# Patient Record
Sex: Female | Born: 1965
Health system: Southern US, Community
[De-identification: ages and names within clinical notes are randomized; demographics above are authoritative.]

## PROBLEM LIST (undated history)

## (undated) DIAGNOSIS — C50919 Malignant neoplasm of unspecified site of unspecified female breast: Secondary | ICD-10-CM

## (undated) DIAGNOSIS — Z809 Family history of malignant neoplasm, unspecified: Secondary | ICD-10-CM

## (undated) DIAGNOSIS — E785 Hyperlipidemia, unspecified: Secondary | ICD-10-CM

## (undated) DIAGNOSIS — K5190 Ulcerative colitis, unspecified, without complications: Secondary | ICD-10-CM

## (undated) DIAGNOSIS — F32A Depression, unspecified: Secondary | ICD-10-CM

## (undated) DIAGNOSIS — R030 Elevated blood-pressure reading, without diagnosis of hypertension: Secondary | ICD-10-CM

## (undated) DIAGNOSIS — E119 Type 2 diabetes mellitus without complications: Secondary | ICD-10-CM

## (undated) DIAGNOSIS — E079 Disorder of thyroid, unspecified: Secondary | ICD-10-CM

## (undated) DIAGNOSIS — I10 Essential (primary) hypertension: Secondary | ICD-10-CM

## (undated) DIAGNOSIS — T7840XA Allergy, unspecified, initial encounter: Secondary | ICD-10-CM

## (undated) DIAGNOSIS — C801 Malignant (primary) neoplasm, unspecified: Secondary | ICD-10-CM

## (undated) DIAGNOSIS — I8390 Asymptomatic varicose veins of unspecified lower extremity: Secondary | ICD-10-CM

## (undated) DIAGNOSIS — R51 Headache: Secondary | ICD-10-CM

## (undated) DIAGNOSIS — M1990 Unspecified osteoarthritis, unspecified site: Secondary | ICD-10-CM

## (undated) DIAGNOSIS — J301 Allergic rhinitis due to pollen: Secondary | ICD-10-CM

## (undated) DIAGNOSIS — B019 Varicella without complication: Secondary | ICD-10-CM

## (undated) DIAGNOSIS — F329 Major depressive disorder, single episode, unspecified: Secondary | ICD-10-CM

## (undated) DIAGNOSIS — R112 Nausea with vomiting, unspecified: Secondary | ICD-10-CM

## (undated) DIAGNOSIS — J45909 Unspecified asthma, uncomplicated: Secondary | ICD-10-CM

## (undated) DIAGNOSIS — K635 Polyp of colon: Secondary | ICD-10-CM

## (undated) DIAGNOSIS — Z803 Family history of malignant neoplasm of breast: Secondary | ICD-10-CM

## (undated) DIAGNOSIS — E039 Hypothyroidism, unspecified: Secondary | ICD-10-CM

## (undated) DIAGNOSIS — F419 Anxiety disorder, unspecified: Secondary | ICD-10-CM

## (undated) DIAGNOSIS — Z9109 Other allergy status, other than to drugs and biological substances: Secondary | ICD-10-CM

## (undated) DIAGNOSIS — Z9889 Other specified postprocedural states: Secondary | ICD-10-CM

## (undated) DIAGNOSIS — G43909 Migraine, unspecified, not intractable, without status migrainosus: Secondary | ICD-10-CM

## (undated) DIAGNOSIS — K219 Gastro-esophageal reflux disease without esophagitis: Secondary | ICD-10-CM

## (undated) DIAGNOSIS — Z923 Personal history of irradiation: Secondary | ICD-10-CM

## (undated) HISTORY — DX: Disorder of thyroid, unspecified: E07.9

## (undated) HISTORY — DX: Unspecified asthma, uncomplicated: J45.909

## (undated) HISTORY — DX: Ulcerative colitis, unspecified, without complications: K51.90

## (undated) HISTORY — DX: Essential (primary) hypertension: I10

## (undated) HISTORY — DX: Major depressive disorder, single episode, unspecified: F32.9

## (undated) HISTORY — DX: Allergy, unspecified, initial encounter: T78.40XA

## (undated) HISTORY — PX: VEIN SURGERY: SHX48

## (undated) HISTORY — DX: Hyperlipidemia, unspecified: E78.5

## (undated) HISTORY — DX: Asymptomatic varicose veins of unspecified lower extremity: I83.90

## (undated) HISTORY — PX: HAND SURGERY: SHX662

## (undated) HISTORY — DX: Allergic rhinitis due to pollen: J30.1

## (undated) HISTORY — DX: Gastro-esophageal reflux disease without esophagitis: K21.9

## (undated) HISTORY — DX: Migraine, unspecified, not intractable, without status migrainosus: G43.909

## (undated) HISTORY — PX: OTHER SURGICAL HISTORY: SHX169

## (undated) HISTORY — DX: Family history of malignant neoplasm of breast: Z80.3

## (undated) HISTORY — DX: Depression, unspecified: F32.A

## (undated) HISTORY — PX: WISDOM TOOTH EXTRACTION: SHX21

## (undated) HISTORY — PX: CORONARY ARTERY BYPASS GRAFT: SHX141

## (undated) HISTORY — DX: Other allergy status, other than to drugs and biological substances: Z91.09

## (undated) HISTORY — DX: Family history of malignant neoplasm, unspecified: Z80.9

## (undated) HISTORY — DX: Headache: R51

## (undated) HISTORY — DX: Elevated blood-pressure reading, without diagnosis of hypertension: R03.0

## (undated) HISTORY — DX: Varicella without complication: B01.9

## (undated) HISTORY — PX: EXPLORATORY LAPAROTOMY: SUR591

## (undated) HISTORY — DX: Polyp of colon: K63.5

---
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
## 1997-08-04 ENCOUNTER — Other Ambulatory Visit: Admission: RE | Admit: 1997-08-04 | Discharge: 1997-08-04 | Payer: Self-pay | Admitting: Gynecology

## 1999-01-04 ENCOUNTER — Ambulatory Visit (HOSPITAL_COMMUNITY): Admission: RE | Admit: 1999-01-04 | Discharge: 1999-01-04 | Payer: Self-pay | Admitting: Internal Medicine

## 1999-09-22 ENCOUNTER — Other Ambulatory Visit: Admission: RE | Admit: 1999-09-22 | Discharge: 1999-09-22 | Payer: Self-pay | Admitting: Gynecology

## 2000-06-11 ENCOUNTER — Encounter: Payer: Self-pay | Admitting: Obstetrics and Gynecology

## 2000-06-11 ENCOUNTER — Ambulatory Visit (HOSPITAL_COMMUNITY): Admission: RE | Admit: 2000-06-11 | Discharge: 2000-06-11 | Payer: Self-pay | Admitting: Obstetrics and Gynecology

## 2000-06-13 ENCOUNTER — Encounter: Payer: Self-pay | Admitting: Obstetrics and Gynecology

## 2000-06-13 ENCOUNTER — Ambulatory Visit (HOSPITAL_COMMUNITY): Admission: RE | Admit: 2000-06-13 | Discharge: 2000-06-13 | Payer: Self-pay | Admitting: Obstetrics and Gynecology

## 2000-06-19 ENCOUNTER — Encounter: Admission: RE | Admit: 2000-06-19 | Discharge: 2000-09-17 | Payer: Self-pay | Admitting: Obstetrics and Gynecology

## 2000-07-04 ENCOUNTER — Inpatient Hospital Stay (HOSPITAL_COMMUNITY): Admission: AD | Admit: 2000-07-04 | Discharge: 2000-07-04 | Payer: Self-pay | Admitting: Obstetrics and Gynecology

## 2000-07-05 ENCOUNTER — Encounter: Payer: Self-pay | Admitting: Obstetrics and Gynecology

## 2000-07-05 ENCOUNTER — Ambulatory Visit (HOSPITAL_COMMUNITY): Admission: RE | Admit: 2000-07-05 | Discharge: 2000-07-05 | Payer: Self-pay | Admitting: Obstetrics and Gynecology

## 2000-07-12 ENCOUNTER — Inpatient Hospital Stay (HOSPITAL_COMMUNITY): Admission: AD | Admit: 2000-07-12 | Discharge: 2000-07-12 | Payer: Self-pay | Admitting: Obstetrics and Gynecology

## 2000-07-27 ENCOUNTER — Encounter: Payer: Self-pay | Admitting: Obstetrics and Gynecology

## 2000-07-27 ENCOUNTER — Ambulatory Visit (HOSPITAL_COMMUNITY): Admission: RE | Admit: 2000-07-27 | Discharge: 2000-07-27 | Payer: Self-pay | Admitting: Obstetrics and Gynecology

## 2000-08-09 ENCOUNTER — Inpatient Hospital Stay (HOSPITAL_COMMUNITY): Admission: AD | Admit: 2000-08-09 | Discharge: 2000-08-12 | Payer: Self-pay | Admitting: Obstetrics & Gynecology

## 2000-09-17 ENCOUNTER — Other Ambulatory Visit: Admission: RE | Admit: 2000-09-17 | Discharge: 2000-09-17 | Payer: Self-pay | Admitting: Obstetrics & Gynecology

## 2001-10-22 ENCOUNTER — Other Ambulatory Visit: Admission: RE | Admit: 2001-10-22 | Discharge: 2001-10-22 | Payer: Self-pay | Admitting: Gynecology

## 2002-10-28 ENCOUNTER — Other Ambulatory Visit: Admission: RE | Admit: 2002-10-28 | Discharge: 2002-10-28 | Payer: Self-pay | Admitting: Gynecology

## 2003-11-05 ENCOUNTER — Other Ambulatory Visit: Admission: RE | Admit: 2003-11-05 | Discharge: 2003-11-05 | Payer: Self-pay | Admitting: Gynecology

## 2003-11-24 ENCOUNTER — Encounter: Admission: RE | Admit: 2003-11-24 | Discharge: 2003-11-24 | Payer: Self-pay | Admitting: Family Medicine

## 2003-12-04 ENCOUNTER — Encounter: Admission: RE | Admit: 2003-12-04 | Discharge: 2003-12-04 | Payer: Self-pay | Admitting: Family Medicine

## 2004-02-11 ENCOUNTER — Ambulatory Visit: Payer: Self-pay | Admitting: Family Medicine

## 2004-02-18 ENCOUNTER — Ambulatory Visit: Payer: Self-pay | Admitting: Internal Medicine

## 2004-06-10 ENCOUNTER — Ambulatory Visit: Payer: Self-pay | Admitting: Internal Medicine

## 2004-06-17 ENCOUNTER — Ambulatory Visit: Payer: Self-pay | Admitting: Internal Medicine

## 2004-08-19 ENCOUNTER — Ambulatory Visit: Payer: Self-pay | Admitting: Internal Medicine

## 2004-08-25 ENCOUNTER — Ambulatory Visit: Payer: Self-pay | Admitting: Internal Medicine

## 2004-09-07 ENCOUNTER — Ambulatory Visit: Payer: Self-pay | Admitting: Pulmonary Disease

## 2004-09-09 ENCOUNTER — Ambulatory Visit: Payer: Self-pay | Admitting: Cardiology

## 2004-09-22 ENCOUNTER — Ambulatory Visit: Payer: Self-pay | Admitting: Internal Medicine

## 2004-09-29 ENCOUNTER — Ambulatory Visit: Payer: Self-pay | Admitting: Pulmonary Disease

## 2004-10-14 ENCOUNTER — Ambulatory Visit: Payer: Self-pay | Admitting: Pulmonary Disease

## 2004-10-17 ENCOUNTER — Ambulatory Visit: Payer: Self-pay | Admitting: Pulmonary Disease

## 2004-11-10 ENCOUNTER — Ambulatory Visit: Payer: Self-pay | Admitting: Internal Medicine

## 2004-11-25 ENCOUNTER — Ambulatory Visit: Payer: Self-pay | Admitting: Pulmonary Disease

## 2004-12-05 ENCOUNTER — Other Ambulatory Visit: Admission: RE | Admit: 2004-12-05 | Discharge: 2004-12-05 | Payer: Self-pay | Admitting: Gynecology

## 2004-12-23 ENCOUNTER — Ambulatory Visit: Payer: Self-pay | Admitting: Pulmonary Disease

## 2005-01-06 ENCOUNTER — Ambulatory Visit: Payer: Self-pay | Admitting: Pulmonary Disease

## 2005-01-11 ENCOUNTER — Ambulatory Visit: Payer: Self-pay | Admitting: Internal Medicine

## 2005-01-20 ENCOUNTER — Ambulatory Visit: Payer: Self-pay | Admitting: Pulmonary Disease

## 2005-03-27 ENCOUNTER — Ambulatory Visit: Payer: Self-pay | Admitting: Pulmonary Disease

## 2005-03-30 ENCOUNTER — Ambulatory Visit: Payer: Self-pay | Admitting: Internal Medicine

## 2005-04-11 ENCOUNTER — Ambulatory Visit: Payer: Self-pay | Admitting: Internal Medicine

## 2005-04-13 ENCOUNTER — Ambulatory Visit: Payer: Self-pay | Admitting: Pulmonary Disease

## 2005-04-28 ENCOUNTER — Ambulatory Visit: Payer: Self-pay | Admitting: Pulmonary Disease

## 2005-05-08 ENCOUNTER — Ambulatory Visit: Payer: Self-pay | Admitting: Internal Medicine

## 2005-07-11 ENCOUNTER — Ambulatory Visit: Payer: Self-pay | Admitting: Internal Medicine

## 2005-08-16 ENCOUNTER — Ambulatory Visit: Payer: Self-pay | Admitting: Internal Medicine

## 2005-08-17 ENCOUNTER — Ambulatory Visit: Payer: Self-pay | Admitting: Internal Medicine

## 2005-08-18 ENCOUNTER — Ambulatory Visit: Payer: Self-pay | Admitting: Internal Medicine

## 2005-08-22 ENCOUNTER — Ambulatory Visit: Payer: Self-pay | Admitting: Internal Medicine

## 2005-10-27 ENCOUNTER — Ambulatory Visit: Payer: Self-pay | Admitting: Internal Medicine

## 2005-11-27 ENCOUNTER — Ambulatory Visit: Payer: Self-pay | Admitting: Internal Medicine

## 2006-11-29 DIAGNOSIS — J45909 Unspecified asthma, uncomplicated: Secondary | ICD-10-CM | POA: Insufficient documentation

## 2006-11-29 DIAGNOSIS — E039 Hypothyroidism, unspecified: Secondary | ICD-10-CM | POA: Insufficient documentation

## 2006-11-29 DIAGNOSIS — K589 Irritable bowel syndrome without diarrhea: Secondary | ICD-10-CM

## 2006-11-29 HISTORY — DX: Irritable bowel syndrome, unspecified: K58.9

## 2006-12-06 DIAGNOSIS — E785 Hyperlipidemia, unspecified: Secondary | ICD-10-CM | POA: Insufficient documentation

## 2006-12-06 DIAGNOSIS — R519 Headache, unspecified: Secondary | ICD-10-CM | POA: Insufficient documentation

## 2006-12-06 DIAGNOSIS — Z8541 Personal history of malignant neoplasm of cervix uteri: Secondary | ICD-10-CM

## 2006-12-06 DIAGNOSIS — R51 Headache: Secondary | ICD-10-CM

## 2006-12-06 HISTORY — DX: Headache: R51

## 2006-12-06 HISTORY — DX: Personal history of malignant neoplasm of cervix uteri: Z85.41

## 2008-04-17 HISTORY — PX: ENDOMETRIAL ABLATION: SHX621

## 2009-04-17 HISTORY — PX: ABLATION: SHX5711

## 2009-11-29 ENCOUNTER — Ambulatory Visit: Payer: Self-pay | Admitting: Internal Medicine

## 2010-02-02 ENCOUNTER — Ambulatory Visit: Payer: Self-pay | Admitting: Obstetrics and Gynecology

## 2010-03-22 ENCOUNTER — Ambulatory Visit: Payer: Self-pay | Admitting: Allergy

## 2010-04-17 HISTORY — PX: FOOT SURGERY: SHX648

## 2010-08-29 ENCOUNTER — Ambulatory Visit: Payer: Self-pay | Admitting: Obstetrics and Gynecology

## 2011-08-30 ENCOUNTER — Ambulatory Visit: Payer: Self-pay | Admitting: Obstetrics and Gynecology

## 2011-12-06 ENCOUNTER — Ambulatory Visit: Payer: Self-pay | Admitting: Internal Medicine

## 2011-12-06 LAB — CREATININE, SERUM
Creatinine: 0.8 mg/dL (ref 0.60–1.30)
EGFR (Non-African Amer.): >60

## 2012-09-30 ENCOUNTER — Ambulatory Visit: Payer: Self-pay | Admitting: Gastroenterology

## 2012-10-01 LAB — PATHOLOGY REPORT

## 2012-10-29 ENCOUNTER — Ambulatory Visit: Payer: Self-pay | Admitting: Obstetrics and Gynecology

## 2012-12-16 LAB — HM COLONOSCOPY

## 2013-02-10 ENCOUNTER — Ambulatory Visit: Payer: Self-pay | Admitting: Physician Assistant

## 2013-02-10 LAB — RAPID STREP-A WITH REFLX: Micro Text Report: NEGATIVE

## 2013-02-13 LAB — BETA STREP CULTURE(ARMC)

## 2013-04-14 ENCOUNTER — Encounter: Payer: Self-pay | Admitting: Physician Assistant

## 2013-04-14 ENCOUNTER — Ambulatory Visit (INDEPENDENT_AMBULATORY_CARE_PROVIDER_SITE_OTHER): Payer: 59 | Admitting: Physician Assistant

## 2013-04-14 VITALS — BP 120/84 | HR 91 | Temp 98.5°F | Resp 16 | Ht 63.0 in | Wt 156.5 lb

## 2013-04-14 DIAGNOSIS — F329 Major depressive disorder, single episode, unspecified: Secondary | ICD-10-CM

## 2013-04-14 DIAGNOSIS — K5190 Ulcerative colitis, unspecified, without complications: Secondary | ICD-10-CM

## 2013-04-14 DIAGNOSIS — E785 Hyperlipidemia, unspecified: Secondary | ICD-10-CM

## 2013-04-14 DIAGNOSIS — J45909 Unspecified asthma, uncomplicated: Secondary | ICD-10-CM

## 2013-04-14 DIAGNOSIS — E039 Hypothyroidism, unspecified: Secondary | ICD-10-CM

## 2013-04-14 DIAGNOSIS — Z0000 Encounter for general adult medical examination without abnormal findings: Secondary | ICD-10-CM

## 2013-04-14 DIAGNOSIS — F341 Dysthymic disorder: Secondary | ICD-10-CM

## 2013-04-14 DIAGNOSIS — Z23 Encounter for immunization: Secondary | ICD-10-CM

## 2013-04-14 DIAGNOSIS — F419 Anxiety disorder, unspecified: Secondary | ICD-10-CM

## 2013-04-14 DIAGNOSIS — F32A Depression, unspecified: Secondary | ICD-10-CM

## 2013-04-14 DIAGNOSIS — I10 Essential (primary) hypertension: Secondary | ICD-10-CM

## 2013-04-14 DIAGNOSIS — B002 Herpesviral gingivostomatitis and pharyngotonsillitis: Secondary | ICD-10-CM

## 2013-04-14 LAB — BASIC METABOLIC PANEL WITH GFR
CO2: 26 mEq/L (ref 19–32)
Chloride: 103 mEq/L (ref 96–112)
Creat: 0.62 mg/dL (ref 0.50–1.10)
GFR, Est Non African American: >89 mL/min
Glucose, Bld: 93 mg/dL (ref 70–99)
Sodium: 139 mEq/L (ref 135–145)

## 2013-04-14 LAB — HEMOGLOBIN A1C: Mean Plasma Glucose: 131 mg/dL — ABNORMAL HIGH (ref <117)

## 2013-04-14 LAB — HEPATIC FUNCTION PANEL
ALT: 20 U/L (ref 0–35)
Albumin: 4.1 g/dL (ref 3.5–5.2)
Total Bilirubin: 0.3 mg/dL (ref 0.3–1.2)

## 2013-04-14 LAB — TSH: TSH: 1.392 u[IU]/mL (ref 0.350–4.500)

## 2013-04-14 LAB — LIPID PANEL
Cholesterol: 209 mg/dL — ABNORMAL HIGH (ref 0–200)
HDL: 51 mg/dL (ref >39)
LDL Cholesterol: 116 mg/dL — ABNORMAL HIGH (ref 0–99)
Total CHOL/HDL Ratio: 4.1 Ratio
Triglycerides: 210 mg/dL — ABNORMAL HIGH (ref <150)

## 2013-04-14 LAB — T4, FREE: Free T4: 1.63 ng/dL (ref 0.80–1.80)

## 2013-04-14 NOTE — Patient Instructions (Addendum)
Please obtain labs. I will call you with your results.  You will be contacted by OB/GYN and Gastroenterology.  It was a pleasure participating in your care today.

## 2013-04-14 NOTE — Progress Notes (Signed)
Pre visit review using our clinic review tool, if applicable. No additional management support is needed unless otherwise documented below in the visit note/SLS  

## 2013-04-14 NOTE — Progress Notes (Signed)
Patient ID: Erin Fields, female   DOB: 1965-10-12, 47 y.o.   MRN: [id number]  Patient presents to clinic today to establish care.  Acute Concerns: Patient fasting for labs.    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 Patient requests prescription for Zovirax to have at home, to begin taking at first sign of outbreak. Denies history of genital herpes.  (2) Ulcerative Colitis -- diagnosed several years prior. Patient needs referral to GI I, as she does not have followup since moving to [place]. Patient currently on mesalamine. Has prescription for Phenergan. Denies flareup.  (3) Hypertension -- patient currently on Benicar. Denies headache, vision changes, chest pain, lightheadedness, dizziness, shortness of breath or palpitation. Has had a sleep study in the past, which was negative for obstructive sleep apnea.  (4) Hypothyroidism -- currently on levothyroxine 100 mcg daily. Is due for repeat thyroid function testing.  (5) Allergic rhinitis -- seasonal. Has prescription for Nasacort. Take Singulair occasionally.  (6) Asthma -- mild to moderate persistent. Currently on Symbicort and Xopenex.  (7) Anxiety and Depression -- symptoms well controlled with venlafaxine. Occasional Ambien for sleep. Denies suicidal thought or ideation. Denies panic attack.  Health Maintenance: Dental -- UTD Vision -- UTD Immunizations -- UTD Colonoscopy -- 2014; benign polyp. Mammogram -- 2014 WNL PAP -- 2014 no abnormal findings; patient does have history of cervical cancer. Bone Density -- 2007; no evidence of osteopenia.  Past Medical History  Diagnosis Date  . Asthma   . Depression   . Frequent headaches   . GERD (gastroesophageal reflux disease)   . Hay fever   . Environmental allergies   . Elevated blood pressure   . Hyperlipidemia   . Migraines   . Colon polyps   . Thyroid disease   . Chicken pox     Past Surgical History  Procedure Laterality  Date  . Foot surgery  2012  . Ablation  2011  . Tonsillectomy  1980  . Wisdom tooth extraction    . Vein surgery      Removal Right Leg    No current outpatient prescriptions on file prior to visit.   No current facility-administered medications on file prior to visit.    Allergies  Allergen Reactions  . Ace Inhibitors Other (See Comments)    Angina  . Cefaclor   . Lisinopril Cough  . Losartan Potassium   . Sulfonamide Derivatives     Family History  Problem Relation Age of Onset  . Arthritis Mother 75    Deceased  . Breast cancer Mother   . Lung cancer Mother   . Hyperlipidemia Mother   . Heart disease Mother   . Hypertension Mother   . Diabetes Mother   . Crohn's disease Mother   . Diabetes Maternal Grandfather   . Heart disease Maternal Grandfather   . Heart attack Maternal Grandfather   . Colitis Sister     History   Social History  . Marital Status: Married    Spouse Name: N/A    Number of Children: N/A  . Years of Education: N/A   Occupational History  . Not on file.   Social History Main Topics  . Smoking status: Former Research scientist (life sciences)  . Smokeless tobacco: Never Used     Comment: Quit >20 yrs ago  . Alcohol Use: 1.8 oz/week    3 Glasses of wine per week  . Drug Use: No  . Sexual Activity: Not  on file   Other Topics Concern  . Not on file   Social History Narrative  . No narrative on file   Review of Systems  Constitutional: Negative for fever and weight loss.  HENT: Negative for ear discharge, ear pain, hearing loss and tinnitus.   Eyes: Negative for blurred vision, double vision, photophobia and pain.  Respiratory: Positive for shortness of breath and wheezing. Negative for cough.   Cardiovascular: Negative for chest pain and palpitations.  Gastrointestinal: Positive for heartburn. Negative for nausea, vomiting, abdominal pain, diarrhea, constipation, blood in stool and melena.  Genitourinary: Negative for dysuria, urgency, frequency,  hematuria and flank pain.  Neurological: Positive for headaches. Negative for dizziness, seizures and loss of consciousness.  Endo/Heme/Allergies: Positive for environmental allergies.  Psychiatric/Behavioral: Positive for depression. Negative for suicidal ideas, hallucinations and substance abuse. The patient is nervous/anxious. The patient does not have insomnia.    BP 120/84  Pulse 91  Temp(Src) 98.5 F (36.9 C) (Oral)  Resp 16  Ht 5' 3"  (1.6 m)  Wt 156 lb 8 oz (70.988 kg)  BMI 27.73 kg/m2  SpO2 97%  Physical Exam  Vitals reviewed. Constitutional: She is oriented to person, place, and time and well-developed, well-nourished, and in no distress.  HENT:  Head: Normocephalic and atraumatic.  Right Ear: External ear normal.  Left Ear: External ear normal.  Nose: Nose normal.  Mouth/Throat: Oropharynx is clear and moist. No oropharyngeal exudate.  Tympanic membranes within normal limits bilaterally.  Eyes: Conjunctivae are normal. Pupils are equal, round, and reactive to light.  Neck: Neck supple.  Cardiovascular: Normal rate, regular rhythm, normal heart sounds and intact distal pulses.   Pulmonary/Chest: Effort normal and breath sounds normal. No respiratory distress. She has no wheezes. She has no rales. She exhibits no tenderness.  Abdominal: Soft. Bowel sounds are normal. She exhibits no distension and no mass. There is no tenderness. There is no rebound and no guarding.  Lymphadenopathy:    She has no cervical adenopathy.  Neurological: She is alert and oriented to person, place, and time. No cranial nerve deficit.  Skin: Skin is warm and dry. No rash noted.  Psychiatric: Affect normal.   Assessment/Plan: No problem-specific assessment & plan notes found for this encounter.

## 2013-04-15 ENCOUNTER — Telehealth: Payer: Self-pay | Admitting: Physician Assistant

## 2013-04-15 LAB — CBC WITH DIFFERENTIAL/PLATELET
Basophils Absolute: 0.1 10*3/uL (ref 0.0–0.1)
Eosinophils Relative: 1 % (ref 0–5)
Hemoglobin: 14.5 g/dL (ref 12.0–15.0)
Lymphocytes Relative: 38 % (ref 12–46)
Lymphs Abs: 6.8 10*3/uL — ABNORMAL HIGH (ref 0.7–4.0)
MCH: 29.6 pg (ref 26.0–34.0)
MCV: 88.2 fL (ref 78.0–100.0)
Monocytes Relative: 8 % (ref 3–12)
Neutro Abs: 9.2 10*3/uL — ABNORMAL HIGH (ref 1.7–7.7)
Neutrophils Relative %: 53 % (ref 43–77)
Platelets: 515 10*3/uL — ABNORMAL HIGH (ref 150–400)
RBC: 4.9 MIL/uL (ref 3.87–5.11)
WBC: 17.6 10*3/uL — ABNORMAL HIGH (ref 4.0–10.5)

## 2013-04-15 LAB — VITAMIN D 25 HYDROXY (VIT D DEFICIENCY, FRACTURES): Vit D, 25-Hydroxy: 28 ng/mL — ABNORMAL LOW (ref 30–89)

## 2013-04-15 LAB — URINALYSIS, ROUTINE W REFLEX MICROSCOPIC
Glucose, UA: NEGATIVE mg/dL
Leukocytes, UA: NEGATIVE
Nitrite: NEGATIVE
Protein, ur: NEGATIVE mg/dL
Specific Gravity, Urine: 1.014 (ref 1.005–1.030)
Urobilinogen, UA: 0.2 mg/dL (ref 0.0–1.0)
pH: 5.5 (ref 5.0–8.0)

## 2013-04-15 NOTE — Telephone Encounter (Signed)
Notified pt and she voices understanding. Denies any current symptoms and scheduled appt for Friday at 7:15am to follow up on elevated WBC as she states she starts a new job on Monday.

## 2013-04-15 NOTE — Telephone Encounter (Signed)
Called patient to discuss lab results.  No answer.  LMOM for patient to return call.  If patient calls back while I am out of office:    Her labs revealed she has very slightly decreased Vitamin D levels.  I do not feel it is worrisome.  She may take a supplement if she likes, but making sure she gets 15 minutes of sunlight each day will probably suffice.  We will recheck this in 6 months.  Of note, her WBC count was significantly elevated.  She did not express any symptoms of abdominal pain, fever, etc at yesterday's visit.  If she is asymptomatic, I would like to see her in 1 week to recheck her blood count.  I have already placed a referral to GI for her Ulcerative Colitis.  She should be contacted by a GI office.  If she is asymptomatic, I would like for her to return to clinic sooner for re-evaluation.  Also, please ask patient if she has a history of elevated WBC due to her Ulcerative Colitis.  We need to get her records from her previous PCP.

## 2013-04-15 NOTE — Telephone Encounter (Signed)
Pt left message returning our call. Attempted to reach pt and left message for her to return my call.

## 2013-04-18 ENCOUNTER — Encounter: Payer: Self-pay | Admitting: Physician Assistant

## 2013-04-18 ENCOUNTER — Ambulatory Visit (INDEPENDENT_AMBULATORY_CARE_PROVIDER_SITE_OTHER): Payer: 59 | Admitting: Physician Assistant

## 2013-04-18 ENCOUNTER — Ambulatory Visit (HOSPITAL_BASED_OUTPATIENT_CLINIC_OR_DEPARTMENT_OTHER)
Admission: RE | Admit: 2013-04-18 | Discharge: 2013-04-18 | Disposition: A | Discharge status: Home / Self Care | Payer: 59 | Source: Ambulatory Visit | Attending: Physician Assistant | Admitting: Physician Assistant

## 2013-04-18 VITALS — BP 118/82 | HR 79 | Temp 97.6°F | Resp 16 | Ht 63.0 in | Wt 154.8 lb

## 2013-04-18 DIAGNOSIS — R05 Cough: Secondary | ICD-10-CM | POA: Insufficient documentation

## 2013-04-18 DIAGNOSIS — D473 Essential (hemorrhagic) thrombocythemia: Secondary | ICD-10-CM

## 2013-04-18 DIAGNOSIS — D72829 Elevated white blood cell count, unspecified: Secondary | ICD-10-CM | POA: Insufficient documentation

## 2013-04-18 DIAGNOSIS — D75839 Thrombocytosis, unspecified: Secondary | ICD-10-CM

## 2013-04-18 DIAGNOSIS — K5190 Ulcerative colitis, unspecified, without complications: Secondary | ICD-10-CM

## 2013-04-18 DIAGNOSIS — J329 Chronic sinusitis, unspecified: Secondary | ICD-10-CM | POA: Insufficient documentation

## 2013-04-18 DIAGNOSIS — R059 Cough, unspecified: Secondary | ICD-10-CM | POA: Insufficient documentation

## 2013-04-18 LAB — CBC WITH DIFFERENTIAL/PLATELET
BASOS ABS: 0 10*3/uL (ref 0.0–0.1)
Basophils Relative: 0 % (ref 0–1)
Eosinophils Absolute: 0.1 10*3/uL (ref 0.0–0.7)
Eosinophils Relative: 1 % (ref 0–5)
HCT: 42.7 % (ref 36.0–46.0)
Hemoglobin: 14.7 g/dL (ref 12.0–15.0)
LYMPHS PCT: 29 % (ref 12–46)
Lymphs Abs: 3.9 10*3/uL (ref 0.7–4.0)
MCH: 30.6 pg (ref 26.0–34.0)
MCHC: 34.4 g/dL (ref 30.0–36.0)
MCV: 89 fL (ref 78.0–100.0)
Monocytes Absolute: 0.9 10*3/uL (ref 0.1–1.0)
Monocytes Relative: 7 % (ref 3–12)
NEUTROS ABS: 8.4 10*3/uL — AB (ref 1.7–7.7)
NEUTROS PCT: 63 % (ref 43–77)
PLATELETS: 370 10*3/uL (ref 150–400)
RBC: 4.8 MIL/uL (ref 3.87–5.11)
RDW: 13.1 % (ref 11.5–15.5)
WBC: 13.4 10*3/uL — AB (ref 4.0–10.5)

## 2013-04-18 LAB — FERRITIN: FERRITIN: 64 ng/mL (ref 10–291)

## 2013-04-18 NOTE — Patient Instructions (Signed)
Please obtain labs.  I will call you with your results.  We will treat you accordingly. You will be contacted by GI office, hopefully before the end of the day.

## 2013-04-18 NOTE — Progress Notes (Signed)
Pre visit review using our clinic review tool, if applicable. No additional management support is needed unless otherwise documented below in the visit note/SLS  

## 2013-04-20 DIAGNOSIS — K5190 Ulcerative colitis, unspecified, without complications: Secondary | ICD-10-CM | POA: Insufficient documentation

## 2013-04-20 DIAGNOSIS — Z0000 Encounter for general adult medical examination without abnormal findings: Secondary | ICD-10-CM | POA: Insufficient documentation

## 2013-04-20 DIAGNOSIS — D72829 Elevated white blood cell count, unspecified: Secondary | ICD-10-CM | POA: Insufficient documentation

## 2013-04-20 DIAGNOSIS — B002 Herpesviral gingivostomatitis and pharyngotonsillitis: Secondary | ICD-10-CM | POA: Insufficient documentation

## 2013-04-20 HISTORY — DX: Herpesviral gingivostomatitis and pharyngotonsillitis: B00.2

## 2013-04-20 NOTE — Assessment & Plan Note (Signed)
With reactive thrombocytosis. Patient asymptomatic. Physical exam within normal limits. Will repeat labs. Will obtain chest x-ray. Patient already has referral in place to gastroenterology for her ulcerative colitis.  Workup unremarkable and leukocytosis persistent, will need further workup and referral to hematology. Hopefully this is just a lab abnormality or reaction to a viral illness.

## 2013-04-20 NOTE — Progress Notes (Signed)
Patient ID: Erin Fields, female   DOB: 01/29/1966, 48 y.o.   MRN: [id number]  Patient for followup with results. Patient was seen last week to establish care. Fasting labs were obtained, revealing significantly elevated white blood cell count with left shift and thrombocytosis.  Peripheral blood smear was obtained indicating a reactive calls a thrombocytosis. Patient does have a history of ulcerative colitis. Patient denies fever, chills, malaise, fatigue, night sweats or weight loss. Denies respiratory symptoms.  Denies abdominal pain, tenesmus, hematochezia or melena. Denies nausea or vomiting. Denies urinary symptoms. Urinalysis obtained last week was unremarkable. Kidney and liver function were within normal limits.   Past Medical History  Diagnosis Date  . Asthma   . Depression   . Frequent headaches   . GERD (gastroesophageal reflux disease)   . Hay fever   . Environmental allergies   . Elevated blood pressure   . Hyperlipidemia   . Migraines   . Colon polyps   . Thyroid disease   . Chicken pox     Current Outpatient Prescriptions on File Prior to Visit  Medication Sig Dispense Refill  . acyclovir (ZOVIRAX) 400 MG tablet Take 400 mg by mouth as needed.      . budesonide-formoterol (SYMBICORT) 160-4.5 MCG/ACT inhaler Inhale 2 puffs into the lungs 2 (two) times daily.      Marland Kitchen levalbuterol (XOPENEX HFA) 45 MCG/ACT inhaler Inhale 2 puffs into the lungs every 4 (four) hours as needed for wheezing (Q4-6 Hours PRN).      Marland Kitchen levothyroxine (SYNTHROID, LEVOTHROID) 100 MCG tablet Take 100 mcg by mouth daily before breakfast.      . mesalamine (LIALDA) 1.2 G EC tablet Take 2.4 g by mouth daily.      . montelukast (SINGULAIR) 10 MG tablet Take 10 mg by mouth every morning.      . olmesartan (BENICAR) 40 MG tablet Take 40 mg by mouth at bedtime.      . Omega-3 Fatty Acids (FISH OIL) 1200 MG CAPS Take 2 capsules by mouth at bedtime.      . promethazine (PHENERGAN) 25 MG tablet Take 25 mg by mouth  every 6 (six) hours as needed for nausea or vomiting.      . triamcinolone (NASACORT ALLERGY 24HR) 55 MCG/ACT AERO nasal inhaler Place 1 spray into the nose daily.      Marland Kitchen venlafaxine XR (EFFEXOR-XR) 75 MG 24 hr capsule Take 75 mg by mouth daily with breakfast.      . zolpidem (AMBIEN) 5 MG tablet Take 5 mg by mouth at bedtime as needed for sleep.       No current facility-administered medications on file prior to visit.    Allergies  Allergen Reactions  . Ace Inhibitors Other (See Comments)    Angina  . Cefaclor   . Lisinopril Cough  . Losartan Potassium   . Sulfonamide Derivatives     Family History  Problem Relation Age of Onset  . Arthritis Mother 57    Deceased  . Breast cancer Mother   . Lung cancer Mother   . Hyperlipidemia Mother   . Heart disease Mother   . Hypertension Mother   . Diabetes Mother   . Crohn's disease Mother   . Diabetes Maternal Grandfather   . Heart disease Maternal Grandfather   . Heart attack Maternal Grandfather   . Colitis Sister     History   Social History  . Marital Status: Married    Spouse Name: N/A  Number of Children: N/A  . Years of Education: N/A   Social History Main Topics  . Smoking status: Former Research scientist (life sciences)  . Smokeless tobacco: Never Used     Comment: Quit >20 yrs ago  . Alcohol Use: 1.8 oz/week    3 Glasses of wine per week  . Drug Use: No  . Sexual Activity: None   Other Topics Concern  . None   Social History Narrative  . None   Review of Systems - See HPI.  All other ROS are negative.  Filed Vitals:   04/18/13 0719  BP: 118/82  Pulse: 79  Temp: 97.6 F (36.4 C)  Resp: 16   Physical Exam  Vitals reviewed. Constitutional: She is oriented to person, place, and time and well-developed, well-nourished, and in no distress.  HENT:  Head: Normocephalic and atraumatic.  Right Ear: External ear normal.  Left Ear: External ear normal.  Nose: Nose normal.  Mouth/Throat: Oropharynx is clear and moist. No  oropharyngeal exudate.  Tympanic membranes within normal limits bilaterally. No tenderness to percussion of sinuses noted on examination.  Eyes: Conjunctivae and EOM are normal. Pupils are equal, round, and reactive to light.  Neck: Neck supple. No thyromegaly present.  Cardiovascular: Normal rate, regular rhythm, normal heart sounds and intact distal pulses.   Pulmonary/Chest: Effort normal and breath sounds normal. No respiratory distress. She has no wheezes. She has no rales. She exhibits no tenderness.  Abdominal: Soft. Bowel sounds are normal. She exhibits no distension and no mass. There is no tenderness. There is no rebound and no guarding.  Lymphadenopathy:    She has no cervical adenopathy.  Neurological: She is alert and oriented to person, place, and time.  Skin: Skin is warm and dry. No rash noted.  Psychiatric: Affect normal.    Recent Results (from the past 2160 hour(s))  CBC WITH DIFFERENTIAL     Status: Abnormal   Collection Time    04/14/13  8:29 AM      Result Value Range   WBC 17.6 (*) 4.0 - 10.5 K/uL   RBC 4.90  3.87 - 5.11 MIL/uL   Hemoglobin 14.5  12.0 - 15.0 g/dL   HCT 43.2  36.0 - 46.0 %   MCV 88.2  78.0 - 100.0 fL   MCH 29.6  26.0 - 34.0 pg   MCHC 33.6  30.0 - 36.0 g/dL   RDW 13.2  11.5 - 15.5 %   Platelets 515 (*) 150 - 400 K/uL   Comment: Platelet clumps noted on smear-count appears increased.   Neutrophils Relative % 53  43 - 77 %   Neutro Abs 9.2 (*) 1.7 - 7.7 K/uL   Lymphocytes Relative 38  12 - 46 %   Lymphs Abs 6.8 (*) 0.7 - 4.0 K/uL   Monocytes Relative 8  3 - 12 %   Monocytes Absolute 1.5 (*) 0.1 - 1.0 K/uL   Eosinophils Relative 1  0 - 5 %   Eosinophils Absolute 0.1  0.0 - 0.7 K/uL   Basophils Relative 0  0 - 1 %   Basophils Absolute 0.1  0.0 - 0.1 K/uL   Smear Review       Comment: Atypical lymphs.     Pending path review  BASIC METABOLIC PANEL WITH GFR     Status: None   Collection Time    04/14/13  8:29 AM      Result Value Range    Sodium 139  135 - 145 mEq/L  Potassium 4.8  3.5 - 5.3 mEq/L   Chloride 103  96 - 112 mEq/L   CO2 26  19 - 32 mEq/L   Glucose, Bld 93  70 - 99 mg/dL   BUN 14  6 - 23 mg/dL   Creat 0.62  0.50 - 1.10 mg/dL   Calcium 9.5  8.4 - 10.5 mg/dL   GFR, Est African American >89     GFR, Est Non African American >89     Comment:       The estimated GFR is a calculation valid for adults (>=81 years old)     that uses the CKD-EPI algorithm to adjust for age and sex. It is       not to be used for children, pregnant women, hospitalized patients,        patients on dialysis, or with rapidly changing kidney function.     According to the NKDEP, eGFR >89 is normal, 60-89 shows mild     impairment, 30-59 shows moderate impairment, 15-29 shows severe     impairment and <15 is ESRD.        TSH     Status: None   Collection Time    04/14/13  8:29 AM      Result Value Range   TSH 1.392  0.350 - 4.500 uIU/mL  T4, FREE     Status: None   Collection Time    04/14/13  8:29 AM      Result Value Range   Free T4 1.63  0.80 - 1.80 ng/dL  VITAMIN D 25 HYDROXY     Status: Abnormal   Collection Time    04/14/13  8:29 AM      Result Value Range   Vit D, 25-Hydroxy 28 (*) 30 - 89 ng/mL   Comment: This assay accurately quantifies Vitamin D, which is the sum of the     25-Hydroxy forms of Vitamin D2 and D3.  Studies have shown that the     optimum concentration of 25-Hydroxy Vitamin D is 30 ng/mL or higher.      Concentrations of Vitamin D between 20 and 29 ng/mL are considered to     be insufficient and concentrations less than 20 ng/mL are considered     to be deficient for Vitamin D.  URINALYSIS, ROUTINE W REFLEX MICROSCOPIC     Status: None   Collection Time    04/14/13  8:29 AM      Result Value Range   Color, Urine YELLOW  YELLOW   APPearance CLEAR  CLEAR   Specific Gravity, Urine 1.014  1.005 - 1.030   pH 5.5  5.0 - 8.0   Glucose, UA NEG  NEG mg/dL   Bilirubin Urine NEG  NEG   Ketones, ur NEG  NEG  mg/dL   Hgb urine dipstick NEG  NEG   Protein, ur NEG  NEG mg/dL   Urobilinogen, UA 0.2  0.0 - 1.0 mg/dL   Nitrite NEG  NEG   Leukocytes, UA NEG  NEG  HEPATIC FUNCTION PANEL     Status: Abnormal   Collection Time    04/14/13  8:29 AM      Result Value Range   Total Bilirubin 0.3  0.3 - 1.2 mg/dL   Bilirubin, Direct 0.1  0.0 - 0.3 mg/dL   Indirect Bilirubin 0.2  0.0 - 0.9 mg/dL   Alkaline Phosphatase 55  39 - 117 U/L   AST 16  0 - 37 U/L  ALT 20  0 - 35 U/L   Total Protein 5.9 (*) 6.0 - 8.3 g/dL   Albumin 4.1  3.5 - 5.2 g/dL  LIPID PANEL     Status: Abnormal   Collection Time    04/14/13  8:29 AM      Result Value Range   Cholesterol 209 (*) 0 - 200 mg/dL   Comment: ATP III Classification:           < 200        mg/dL        Desirable          200 - 239     mg/dL        Borderline High          >= 240        mg/dL        High         Triglycerides 210 (*) <150 mg/dL   HDL 51  >39 mg/dL   Total CHOL/HDL Ratio 4.1     VLDL 42 (*) 0 - 40 mg/dL   LDL Cholesterol 116 (*) 0 - 99 mg/dL   Comment:       Total Cholesterol/HDL Ratio:CHD Risk                            Coronary Heart Disease Risk Table                                            Men       Women              1/2 Average Risk              3.4        3.3                  Average Risk              5.0        4.4               2X Average Risk              9.6        7.1               3X Average Risk             23.4       11.0     Use the calculated Patient Ratio above and the CHD Risk table      to determine the patient's CHD Risk.     ATP III Classification (LDL):           < 100        mg/dL         Optimal          100 - 129     mg/dL         Near or Above Optimal          130 - 159     mg/dL         Borderline High          160 - 189     mg/dL         High           >  190        mg/dL         Very High        HEMOGLOBIN A1C     Status: Abnormal   Collection Time    04/14/13  8:29 AM      Result Value Range    Hemoglobin A1C 6.2 (*) <5.7 %   Comment:                                                                            According to the ADA Clinical Practice Recommendations for 2011, when     HbA1c is used as a screening test:             >=6.5%   Diagnostic of Diabetes Mellitus                (if abnormal result is confirmed)           5.7-6.4%   Increased risk of developing Diabetes Mellitus           References:Diagnosis and Classification of Diabetes Mellitus,Diabetes     VVZS,8270,78(MLJQG 1):S62-S69 and Standards of Medical Care in             Diabetes - 2011,Diabetes Care,2011,34 (Suppl 1):S11-S61.         Mean Plasma Glucose 131 (*) <117 mg/dL  PATHOLOGIST SMEAR REVIEW     Status: None   Collection Time    04/14/13  8:29 AM      Result Value Range   Path Review       Comment:   Absolute Lymphocytosis and neutrophilia with mild left shift in     maturation. Favor reactive process for both. Recommend clinical     correlation.    The overall findings in this smear are consistent with     a reactive thrombocytosis.  Clinical correlation is recommended.     Reviewed by Francis Gaines Mammarappallil MD (Electronic Signature on File)     04/15/13  CBC WITH DIFFERENTIAL     Status: Abnormal   Collection Time    04/18/13  8:16 AM      Result Value Range   WBC 13.4 (*) 4.0 - 10.5 K/uL   RBC 4.80  3.87 - 5.11 MIL/uL   Hemoglobin 14.7  12.0 - 15.0 g/dL   HCT 42.7  36.0 - 46.0 %   MCV 89.0  78.0 - 100.0 fL   MCH 30.6  26.0 - 34.0 pg   MCHC 34.4  30.0 - 36.0 g/dL   RDW 13.1  11.5 - 15.5 %   Platelets 370  150 - 400 K/uL   Neutrophils Relative % 63  43 - 77 %   Neutro Abs 8.4 (*) 1.7 - 7.7 K/uL   Lymphocytes Relative 29  12 - 46 %   Lymphs Abs 3.9  0.7 - 4.0 K/uL   Monocytes Relative 7  3 - 12 %   Monocytes Absolute 0.9  0.1 - 1.0 K/uL   Eosinophils Relative 1  0 - 5 %   Eosinophils Absolute 0.1  0.0 - 0.7 K/uL   Basophils Relative 0  0 - 1 %   Basophils Absolute 0.0  0.0 -  0.1 K/uL    Smear Review Criteria for review not met    FERRITIN     Status: None   Collection Time    04/18/13  8:16 AM      Result Value Range   Ferritin 64  10 - 291 ng/mL    Assessment/Plan: No problem-specific assessment & plan notes found for this encounter.

## 2013-04-20 NOTE — Assessment & Plan Note (Signed)
Continue mesalamine. Referral to GI placed. Patient request Dr. Earlean Shawl.

## 2013-04-20 NOTE — Assessment & Plan Note (Signed)
Will obtain thyroid function tests. Continue levothyroxine as prescribed. Will alter dose of needed.

## 2013-04-20 NOTE — Assessment & Plan Note (Signed)
Will obtain fasting lipid profile.

## 2013-04-20 NOTE — Assessment & Plan Note (Signed)
Zovirax for outbreak.

## 2013-04-20 NOTE — Assessment & Plan Note (Signed)
History reviewed and updated. Health maintenance up-to-date. Will obtain fasting labs. Will obtain records from previous PCP.

## 2013-04-20 NOTE — Assessment & Plan Note (Signed)
Continue current regimen

## 2013-04-22 ENCOUNTER — Telehealth: Payer: Self-pay | Admitting: *Deleted

## 2013-04-22 DIAGNOSIS — D72829 Elevated white blood cell count, unspecified: Secondary | ICD-10-CM

## 2013-04-22 NOTE — Telephone Encounter (Signed)
Notified pt. Lab order entered.

## 2013-04-22 NOTE — Telephone Encounter (Signed)
Message copied by Ronny Flurry on Tue Apr 22, 2013  5:28 PM ------      Message from: Raiford Noble      Created: Sat Apr 19, 2013 10:22 AM       Please inform patient that her WBC count is still elevated but is trending down (was 17.6 and now 13.4 in less than 1 week).  Platelets are back to normal.  Iron level is within normal limits.  As long as she remains asymptomatic, we will have her return to lab in 2-3 weeks for a CBC with diff to ensure resolution of reactive leukocytosis. ------

## 2013-05-13 LAB — CBC WITH DIFFERENTIAL/PLATELET
BASOS PCT: 1 % (ref 0–1)
Basophils Absolute: 0 10*3/uL (ref 0.0–0.1)
EOS ABS: 0.1 10*3/uL (ref 0.0–0.7)
EOS PCT: 1 % (ref 0–5)
HCT: 38.4 % (ref 36.0–46.0)
Hemoglobin: 13 g/dL (ref 12.0–15.0)
LYMPHS PCT: 44 % (ref 12–46)
Lymphs Abs: 3.2 10*3/uL (ref 0.7–4.0)
MCH: 30.4 pg (ref 26.0–34.0)
MCHC: 33.9 g/dL (ref 30.0–36.0)
MCV: 89.7 fL (ref 78.0–100.0)
MONOS PCT: 8 % (ref 3–12)
Monocytes Absolute: 0.6 10*3/uL (ref 0.1–1.0)
NEUTROS ABS: 3.3 10*3/uL (ref 1.7–7.7)
Neutrophils Relative %: 46 % (ref 43–77)
PLATELETS: 443 10*3/uL — AB (ref 150–400)
RBC: 4.28 MIL/uL (ref 3.87–5.11)
RDW: 13.4 % (ref 11.5–15.5)
WBC: 7.3 10*3/uL (ref 4.0–10.5)

## 2013-06-05 ENCOUNTER — Encounter: Payer: 59 | Admitting: Medical

## 2013-06-20 ENCOUNTER — Telehealth: Payer: Self-pay | Admitting: *Deleted

## 2013-06-20 DIAGNOSIS — B002 Herpesviral gingivostomatitis and pharyngotonsillitis: Secondary | ICD-10-CM

## 2013-06-20 MED ORDER — ACYCLOVIR 400 MG PO TABS: 400.0000 mg | ORAL_TABLET | Freq: Three times a day (TID) | ORAL | Status: DC | PRN

## 2013-06-20 NOTE — Telephone Encounter (Signed)
Ok to refill zovirax. Patient has history of [place] outbreak. Keeps an Rx at home to start at onset of outbreak.  If she has persistent symptoms or new symptoms she needs to schedule an office visit.

## 2013-06-20 NOTE — Telephone Encounter (Signed)
Pt left message requesting refill of acyclovir 460m three times a day for 5 days as needed to [facility] [address]  Please advise.

## 2013-06-20 NOTE — Telephone Encounter (Signed)
Rx sent. Notified pt and she voices understanding.

## 2013-06-27 ENCOUNTER — Telehealth: Payer: Self-pay | Admitting: Physician Assistant

## 2013-06-27 DIAGNOSIS — F419 Anxiety disorder, unspecified: Secondary | ICD-10-CM

## 2013-06-27 DIAGNOSIS — F329 Major depressive disorder, single episode, unspecified: Secondary | ICD-10-CM

## 2013-06-27 DIAGNOSIS — F32A Depression, unspecified: Secondary | ICD-10-CM

## 2013-06-27 MED ORDER — ZOLPIDEM TARTRATE 5 MG PO TABS: 5.0000 mg | ORAL_TABLET | Freq: Every evening | ORAL | Status: DC | PRN

## 2013-06-27 NOTE — Telephone Encounter (Signed)
Please Advise

## 2013-06-27 NOTE — Telephone Encounter (Signed)
ambien 5 mg

## 2013-06-27 NOTE — Telephone Encounter (Signed)
Ok to refill. 30 tablets no additional refill.

## 2013-06-27 NOTE — Telephone Encounter (Signed)
Rx request phoned to pharmacy/SLS

## 2013-07-02 ENCOUNTER — Telehealth: Payer: Self-pay | Admitting: Physician Assistant

## 2013-07-02 NOTE — Telephone Encounter (Signed)
Needs ambien rx called in to [facility] in [place]

## 2013-07-02 NOTE — Telephone Encounter (Signed)
Medication Detail      Disp Refills Start End     zolpidem (AMBIEN) 5 MG tablet 30 tablet 0 06/27/2013     Sig - Route: Take 1 tablet (5 mg total) by mouth at bedtime as needed for sleep. - Oral    Class: Phone In    Associated Diagnoses    Anxiety and depression - Primary    Pharmacy    [id number] - [place], [place] - [address]    LMOM to inform pt Rx phoned to pharmacy on 03.13.15/SLS

## 2013-08-12 ENCOUNTER — Other Ambulatory Visit: Payer: Self-pay | Admitting: Physician Assistant

## 2013-08-12 MED ORDER — ZOLPIDEM TARTRATE 5 MG PO TABS: ORAL_TABLET | ORAL | Status: DC

## 2013-08-12 NOTE — Telephone Encounter (Signed)
Rx called to pharmacy voicemail.  Please call pt to arrange follow up as she will need to be seen before further refills are given.

## 2013-08-12 NOTE — Telephone Encounter (Signed)
Pt last seen in January and advised f/u in 3 months. Pt has no future appts on file.  Please advise.  Medication name:  Name from pharmacy:  zolpidem (AMBIEN) 5 MG tablet  ZOLPIDEM 5MG TABLETS Sig: TAKE 1 TABLET BY MOUTH EVERY DAY AT BEDTIME AS NEEDED Dispense: 30 tablet Refills: 0 Start: 08/12/2013 Class: Normal Requested on: 08/12/2013 Originally ordered on: 04/14/2013 Last refill: 06/27/2013

## 2013-08-12 NOTE — Telephone Encounter (Signed)
Ok to refill quantity 30 with 0 refills.  No more refills until patient is seen for follow-up.

## 2013-08-12 NOTE — Addendum Note (Signed)
Addended by: Kelle Darting A on: 08/12/2013 04:46 PM   Modules accepted: Orders

## 2013-08-14 NOTE — Telephone Encounter (Signed)
Informed patient of medication refill and she scheduled appointment for 08/20/13

## 2013-08-14 NOTE — Telephone Encounter (Signed)
Left message for patient to return my call.

## 2013-08-20 ENCOUNTER — Encounter: Payer: Self-pay | Admitting: Physician Assistant

## 2013-08-20 ENCOUNTER — Telehealth: Payer: Self-pay | Admitting: Physician Assistant

## 2013-08-20 ENCOUNTER — Ambulatory Visit (INDEPENDENT_AMBULATORY_CARE_PROVIDER_SITE_OTHER): Payer: 59 | Admitting: Physician Assistant

## 2013-08-20 VITALS — BP 114/76 | HR 76 | Temp 98.4°F | Resp 16 | Ht 63.0 in | Wt 161.2 lb

## 2013-08-20 DIAGNOSIS — F329 Major depressive disorder, single episode, unspecified: Secondary | ICD-10-CM

## 2013-08-20 DIAGNOSIS — B002 Herpesviral gingivostomatitis and pharyngotonsillitis: Secondary | ICD-10-CM

## 2013-08-20 DIAGNOSIS — G4700 Insomnia, unspecified: Secondary | ICD-10-CM

## 2013-08-20 DIAGNOSIS — F32A Depression, unspecified: Secondary | ICD-10-CM

## 2013-08-20 DIAGNOSIS — R252 Cramp and spasm: Secondary | ICD-10-CM

## 2013-08-20 DIAGNOSIS — I10 Essential (primary) hypertension: Secondary | ICD-10-CM

## 2013-08-20 DIAGNOSIS — F419 Anxiety disorder, unspecified: Secondary | ICD-10-CM

## 2013-08-20 DIAGNOSIS — K5190 Ulcerative colitis, unspecified, without complications: Secondary | ICD-10-CM

## 2013-08-20 DIAGNOSIS — F341 Dysthymic disorder: Secondary | ICD-10-CM

## 2013-08-20 DIAGNOSIS — E039 Hypothyroidism, unspecified: Secondary | ICD-10-CM

## 2013-08-20 HISTORY — DX: Insomnia, unspecified: G47.00

## 2013-08-20 MED ORDER — ACYCLOVIR 400 MG PO TABS: 400.0000 mg | ORAL_TABLET | Freq: Three times a day (TID) | ORAL | Status: DC | PRN

## 2013-08-20 MED ORDER — OLMESARTAN MEDOXOMIL 40 MG PO TABS: 40.0000 mg | ORAL_TABLET | Freq: Every day | ORAL | Status: DC

## 2013-08-20 MED ORDER — VENLAFAXINE HCL ER 75 MG PO CP24: 75.0000 mg | ORAL_CAPSULE | Freq: Every day | ORAL | Status: DC

## 2013-08-20 MED ORDER — ZOLPIDEM TARTRATE 5 MG PO TABS: ORAL_TABLET | ORAL | Status: DC

## 2013-08-20 MED ORDER — LEVOTHYROXINE SODIUM 100 MCG PO TABS: 100.0000 ug | ORAL_TABLET | Freq: Every day | ORAL | Status: DC

## 2013-08-20 MED ORDER — PROMETHAZINE HCL 25 MG PO TABS: 25.0000 mg | ORAL_TABLET | Freq: Four times a day (QID) | ORAL | Status: DC | PRN

## 2013-08-20 NOTE — Assessment & Plan Note (Signed)
Ambien refilled.  Pharmacy can fill when due on 09/11/13

## 2013-08-20 NOTE — Patient Instructions (Signed)
Please return to the lab at your earliest convenience so we can check your potassium and magnesium levels.  Continue medications as directed.  We will follow-up in 3 months.  At that time we will recheck some labs including your thyroid function. Return sooner if you need anything.

## 2013-08-20 NOTE — Telephone Encounter (Signed)
Relevant patient education assigned to patient using Emmi. ° °

## 2013-08-20 NOTE — Assessment & Plan Note (Signed)
Asymptomatic.  BP normotensive.  Patient with mild muscle cramps.  Patient is on ARB.  Will check BMP and magnesium level.  Continue Benicar for now.

## 2013-08-20 NOTE — Assessment & Plan Note (Signed)
Medications refilled.  Follow-up in 3 months.  Will repeat TSH at that visit.

## 2013-08-20 NOTE — Progress Notes (Signed)
Patient presents to clinic today for medications refills.  Patient has been doing well on current medication regimen.  Denies chest pain, palpitations, lightheadedness or dizziness.  Endorses BP well controlled with Benicar.  BP normotensive in clinic today.  Patient continue levothyroxine daily. Has been stable on current dose.  Due for repeat TSH level in 3 months.    Past Medical History  Diagnosis Date  . Asthma   . Depression   . Frequent headaches   . GERD (gastroesophageal reflux disease)   . Hay fever   . Environmental allergies   . Elevated blood pressure   . Hyperlipidemia   . Migraines   . Colon polyps   . Thyroid disease   . Chicken pox     Current Outpatient Prescriptions on File Prior to Visit  Medication Sig Dispense Refill  . budesonide-formoterol (SYMBICORT) 160-4.5 MCG/ACT inhaler Inhale 2 puffs into the lungs 2 (two) times daily.      Marland Kitchen levalbuterol (XOPENEX HFA) 45 MCG/ACT inhaler Inhale 2 puffs into the lungs every 4 (four) hours as needed for wheezing (Q4-6 Hours PRN).      Marland Kitchen mesalamine (LIALDA) 1.2 G EC tablet Take 2.4 g by mouth daily.      . montelukast (SINGULAIR) 10 MG tablet Take 10 mg by mouth every morning.      . Omega-3 Fatty Acids (FISH OIL) 1200 MG CAPS Take 2 capsules by mouth at bedtime.      . triamcinolone (NASACORT ALLERGY 24HR) 55 MCG/ACT AERO nasal inhaler Place 1 spray into the nose daily.       No current facility-administered medications on file prior to visit.    Allergies  Allergen Reactions  . Ace Inhibitors Other (See Comments)    Angina  . Cefaclor   . Lisinopril Cough  . Losartan Potassium   . Sulfonamide Derivatives     Family History  Problem Relation Age of Onset  . Arthritis Mother 19    Deceased  . Breast cancer Mother   . Lung cancer Mother   . Hyperlipidemia Mother   . Heart disease Mother   . Hypertension Mother   . Diabetes Mother   . Crohn's disease Mother   . Diabetes Maternal Grandfather   . Heart  disease Maternal Grandfather   . Heart attack Maternal Grandfather   . Colitis Sister     History   Social History  . Marital Status: Married    Spouse Name: N/A    Number of Children: N/A  . Years of Education: N/A   Social History Main Topics  . Smoking status: Former Research scientist (life sciences)  . Smokeless tobacco: Never Used     Comment: Quit >20 yrs ago  . Alcohol Use: 1.8 oz/week    3 Glasses of wine per week  . Drug Use: No  . Sexual Activity: None   Other Topics Concern  . None   Social History Narrative  . None   Review of Systems - See HPI.  All other ROS are negative.  BP 114/76  Pulse 76  Temp(Src) 98.4 F (36.9 C) (Oral)  Resp 16  Ht 5' 3"  (1.6 m)  Wt 161 lb 4 oz (73.143 kg)  BMI 28.57 kg/m2  SpO2 97%  Physical Exam  Vitals reviewed. Constitutional: She is oriented to person, place, and time and well-developed, well-nourished, and in no distress.  HENT:  Head: Normocephalic and atraumatic.  Eyes: Conjunctivae are normal.  Neck: Neck supple.  Cardiovascular: Normal rate, regular rhythm, normal  heart sounds and intact distal pulses.   Pulmonary/Chest: Effort normal and breath sounds normal. No respiratory distress. She has no wheezes. She has no rales. She exhibits no tenderness.  Neurological: She is alert and oriented to person, place, and time.  Skin: Skin is warm and dry. No rash noted.  Psychiatric: Affect normal.   Assessment/Plan: HYPOTHYROIDISM Medications refilled.  Follow-up in 3 months.  Will repeat TSH at that visit.  Insomnia Ambien refilled.  Pharmacy can fill when due on 09/11/13  Hypertension Asymptomatic.  BP normotensive.  Patient with mild muscle cramps.  Patient is on ARB.  Will check BMP and magnesium level.  Continue Benicar for now.

## 2013-08-20 NOTE — Progress Notes (Signed)
Pre visit review using our clinic review tool, if applicable. No additional management support is needed unless otherwise documented below in the visit note/SLS  

## 2013-08-21 LAB — BASIC METABOLIC PANEL
BUN: 9 mg/dL (ref 6–23)
CHLORIDE: 101 meq/L (ref 96–112)
CO2: 22 meq/L (ref 19–32)
Calcium: 9 mg/dL (ref 8.4–10.5)
Creat: 0.66 mg/dL (ref 0.50–1.10)
Glucose, Bld: 166 mg/dL — ABNORMAL HIGH (ref 70–99)
POTASSIUM: 3.9 meq/L (ref 3.5–5.3)
Sodium: 135 mEq/L (ref 135–145)

## 2013-08-21 LAB — MAGNESIUM: Magnesium: 1.4 mg/dL — ABNORMAL LOW (ref 1.5–2.5)

## 2013-08-25 ENCOUNTER — Telehealth: Payer: Self-pay | Admitting: *Deleted

## 2013-08-25 DIAGNOSIS — R790 Abnormal level of blood mineral: Secondary | ICD-10-CM

## 2013-08-25 DIAGNOSIS — R252 Cramp and spasm: Secondary | ICD-10-CM

## 2013-08-25 NOTE — Telephone Encounter (Signed)
Patient informed, understood & agreed; new lab order placed/SLS

## 2013-08-25 NOTE — Telephone Encounter (Signed)
Message copied by Rockwell Germany on Mon Aug 25, 2013  3:35 PM ------      Message from: Raiford Noble      Created: Sun Aug 24, 2013 10:14 PM       Magnesium level is just slightly low.  I would recommend that she try to increase dietary intake of magnesium. Eating a wide variety of legumes, nuts, whole grains, and green vegetables will help you meet your daily dietary need for magnesium.  Recheck in 3-4 weeks.  If still slightly decreased, we can restart magnesium supplementation. ------

## 2013-09-12 ENCOUNTER — Other Ambulatory Visit: Payer: Self-pay | Admitting: Physician Assistant

## 2013-09-15 NOTE — Telephone Encounter (Signed)
Medication Detail      Disp Refills Start End     zolpidem (AMBIEN) 5 MG tablet 30 tablet 1 08/20/2013     Sig: TAKE 1 TABLET BY MOUTH EVERY DAY AT BEDTIME AS NEEDED    Class: Phone In    Notes to Pharmacy: Can be filled on 09/11/13    Pharmacy    [id number] - [place],  - [address] AT [place]   Rx request Denied-Too Soon fore request/SLS

## 2013-09-16 ENCOUNTER — Other Ambulatory Visit: Payer: Self-pay | Admitting: Physician Assistant

## 2013-09-16 ENCOUNTER — Ambulatory Visit (INDEPENDENT_AMBULATORY_CARE_PROVIDER_SITE_OTHER): Payer: 59 | Admitting: Physician Assistant

## 2013-09-16 ENCOUNTER — Encounter: Payer: Self-pay | Admitting: Physician Assistant

## 2013-09-16 ENCOUNTER — Encounter (INDEPENDENT_AMBULATORY_CARE_PROVIDER_SITE_OTHER): Payer: Self-pay

## 2013-09-16 VITALS — BP 112/82 | HR 93 | Temp 98.2°F | Ht 63.0 in | Wt 167.0 lb

## 2013-09-16 DIAGNOSIS — R748 Abnormal levels of other serum enzymes: Secondary | ICD-10-CM

## 2013-09-16 DIAGNOSIS — R1011 Right upper quadrant pain: Secondary | ICD-10-CM | POA: Insufficient documentation

## 2013-09-16 LAB — COMPREHENSIVE METABOLIC PANEL
ALBUMIN: 4.2 g/dL (ref 3.5–5.2)
ALT: 12 U/L (ref 0–35)
AST: 11 U/L (ref 0–37)
Alkaline Phosphatase: 69 U/L (ref 39–117)
BUN: 6 mg/dL (ref 6–23)
CHLORIDE: 102 meq/L (ref 96–112)
CO2: 26 mEq/L (ref 19–32)
Calcium: 9.6 mg/dL (ref 8.4–10.5)
Creat: 0.66 mg/dL (ref 0.50–1.10)
Glucose, Bld: 117 mg/dL — ABNORMAL HIGH (ref 70–99)
POTASSIUM: 4.2 meq/L (ref 3.5–5.3)
Sodium: 140 mEq/L (ref 135–145)
TOTAL PROTEIN: 6.6 g/dL (ref 6.0–8.3)
Total Bilirubin: 0.5 mg/dL (ref 0.2–1.2)

## 2013-09-16 LAB — CBC
HCT: 37.7 % (ref 36.0–46.0)
Hemoglobin: 13.3 g/dL (ref 12.0–15.0)
MCH: 30.3 pg (ref 26.0–34.0)
MCHC: 35.3 g/dL (ref 30.0–36.0)
MCV: 85.9 fL (ref 78.0–100.0)
PLATELETS: 383 10*3/uL (ref 150–400)
RBC: 4.39 MIL/uL (ref 3.87–5.11)
RDW: 13.9 % (ref 11.5–15.5)
WBC: 10.4 10*3/uL (ref 4.0–10.5)

## 2013-09-16 LAB — MAGNESIUM: Magnesium: 1.6 mg/dL (ref 1.5–2.5)

## 2013-09-16 LAB — LIPASE: LIPASE: 303 U/L — AB (ref 0–75)

## 2013-09-16 MED ORDER — TRAMADOL HCL 50 MG PO TABS: 50.0000 mg | ORAL_TABLET | Freq: Three times a day (TID) | ORAL | Status: DC | PRN

## 2013-09-16 NOTE — Patient Instructions (Signed)
Please obtain labs.  The front desk is working on getting you scheduled for your [place].  Stop back by the desk after getting labs.  Use Tramadol for pain. Phenergan for nausea.  Stay hydrated.  Avoid fried, fatty foods.  Read information below.  IF pain acutely worsens or if you develop severe nausea or vomiting before we can complete our workup, please proceed directly to the ER.  Cholelithiasis Cholelithiasis (also called gallstones) is a form of gallbladder disease in which gallstones form in your gallbladder. The gallbladder is an organ that stores bile made in the liver, which helps digest fats. Gallstones begin as small crystals and slowly grow into stones. Gallstone pain occurs when the gallbladder spasms and a gallstone is blocking the duct. Pain can also occur when a stone passes out of the duct.  RISK FACTORS  Being female.   Having multiple pregnancies. Health care providers sometimes advise removing diseased gallbladders before future pregnancies.   Being obese.  Eating a diet heavy in fried foods and fat.   Being older than 25 years and increasing age.   Prolonged use of medicines containing female hormones.   Having diabetes mellitus.   Rapidly losing weight.   Having a family history of gallstones (heredity).  SYMPTOMS  Nausea.   Vomiting.  Abdominal pain.   Yellowing of the skin (jaundice).   Sudden pain. It may persist from several minutes to several hours.  Fever.   Tenderness to the touch. In some cases, when gallstones do not move into the bile duct, people have no pain or symptoms. These are called "silent" gallstones.  TREATMENT Silent gallstones do not need treatment. In severe cases, emergency surgery may be required. Options for treatment include:  Surgery to remove the gallbladder. This is the most common treatment.  Medicines. These do not always work and may take 6 12 months or more to work.  Shock wave treatment (extracorporeal  biliary lithotripsy). In this treatment an ultrasound machine sends shock waves to the gallbladder to break gallstones into smaller pieces that can pass into the intestines or be dissolved by medicine. HOME CARE INSTRUCTIONS   Only take over-the-counter or prescription medicines for pain, discomfort, or fever as directed by your health care provider.   Follow a low-fat diet until seen again by your health care provider. Fat causes the gallbladder to contract, which can result in pain.   Follow up with your health care provider as directed. Attacks are almost always recurrent and surgery is usually required for permanent treatment.  SEEK IMMEDIATE MEDICAL CARE IF:   Your pain increases and is not controlled by medicines.   You have a fever or persistent symptoms for more than 2 3 days.   You have a fever and your symptoms suddenly get worse.   You have persistent nausea and vomiting.  MAKE SURE YOU:   Understand these instructions.  Will watch your condition.  Will get help right away if you are not doing well or get worse. Document Released: 03/30/2005 Document Revised: 12/04/2012 Document Reviewed: 09/25/2012 [facility] Patient Information 2014 [place].

## 2013-09-16 NOTE — Progress Notes (Signed)
Pre visit review using our clinic review tool, if applicable. No additional management support is needed unless otherwise documented below in the visit note. 

## 2013-09-16 NOTE — Progress Notes (Signed)
Patient presents to clinic today c/o right upper quadrant abdominal pain that is present after eating a heavy meal. Pain has been present for 2 weeks. It is throbbing and aching in nature, and can be very severe at times. Patient denies radiation of pain. Endorses some nausea, but denies vomiting. Denies fever, chills or aches. Denies change to bowel or bladder habits. Patient denies acid reflux symptoms. Has taken some Tylenol with little relief of symptoms. Patient denies history of significant abdominal surgery.  Past Medical History  Diagnosis Date  . Asthma   . Depression   . Frequent headaches   . GERD (gastroesophageal reflux disease)   . Hay fever   . Environmental allergies   . Elevated blood pressure   . Hyperlipidemia   . Migraines   . Colon polyps   . Thyroid disease   . Chicken pox     Current Outpatient Prescriptions on File Prior to Visit  Medication Sig Dispense Refill  . acyclovir (ZOVIRAX) 400 MG tablet Take 1 tablet (400 mg total) by mouth 3 (three) times daily as needed (for 5 days).  30 tablet  1  . budesonide-formoterol (SYMBICORT) 160-4.5 MCG/ACT inhaler Inhale 2 puffs into the lungs 2 (two) times daily.      Marland Kitchen levalbuterol (XOPENEX HFA) 45 MCG/ACT inhaler Inhale 2 puffs into the lungs every 4 (four) hours as needed for wheezing (Q4-6 Hours PRN).      Marland Kitchen levothyroxine (SYNTHROID, LEVOTHROID) 100 MCG tablet Take 1 tablet (100 mcg total) by mouth daily before breakfast.  90 tablet  1  . mesalamine (LIALDA) 1.2 G EC tablet Take 2.4 g by mouth daily.      . montelukast (SINGULAIR) 10 MG tablet Take 10 mg by mouth every morning.      . olmesartan (BENICAR) 40 MG tablet Take 1 tablet (40 mg total) by mouth at bedtime.  90 tablet  1  . Omega-3 Fatty Acids (FISH OIL) 1200 MG CAPS Take 2 capsules by mouth at bedtime.      . promethazine (PHENERGAN) 25 MG tablet Take 1 tablet (25 mg total) by mouth every 6 (six) hours as needed for nausea or vomiting.  30 tablet  3  .  triamcinolone (NASACORT ALLERGY 24HR) 55 MCG/ACT AERO nasal inhaler Place 1 spray into the nose daily.      Marland Kitchen venlafaxine XR (EFFEXOR-XR) 75 MG 24 hr capsule Take 1 capsule (75 mg total) by mouth daily with breakfast.  90 capsule  1  . zolpidem (AMBIEN) 5 MG tablet TAKE 1 TABLET BY MOUTH EVERY DAY AT BEDTIME AS NEEDED  30 tablet  1   No current facility-administered medications on file prior to visit.    Allergies  Allergen Reactions  . Ace Inhibitors Other (See Comments)    Angina  . Cefaclor   . Lisinopril Cough  . Losartan Potassium   . Sulfonamide Derivatives     Family History  Problem Relation Age of Onset  . Arthritis Mother 48    Deceased  . Breast cancer Mother   . Lung cancer Mother   . Hyperlipidemia Mother   . Heart disease Mother   . Hypertension Mother   . Diabetes Mother   . Crohn's disease Mother   . Diabetes Maternal Grandfather   . Heart disease Maternal Grandfather   . Heart attack Maternal Grandfather   . Colitis Sister     History   Social History  . Marital Status: Married    Spouse Name: N/A  Number of Children: N/A  . Years of Education: N/A   Social History Main Topics  . Smoking status: Former Research scientist (life sciences)  . Smokeless tobacco: Never Used     Comment: Quit >20 yrs ago  . Alcohol Use: 1.8 oz/week    3 Glasses of wine per week  . Drug Use: No  . Sexual Activity: None   Other Topics Concern  . None   Social History Narrative  . None   Review of Systems - See HPI.  All other ROS are negative.  BP 112/82  Pulse 93  Temp(Src) 98.2 F (36.8 C) (Oral)  Ht 5' 3"  (1.6 m)  Wt 167 lb 0.6 oz (75.769 kg)  BMI 29.60 kg/m2  SpO2 95%  Physical Exam  Vitals reviewed. Constitutional: She is oriented to person, place, and time and well-developed, well-nourished, and in no distress.  HENT:  Head: Normocephalic and atraumatic.  Eyes: Conjunctivae are normal. Pupils are equal, round, and reactive to light.  Neck: Neck supple.  Cardiovascular:  Normal rate, regular rhythm, normal heart sounds and intact distal pulses.   Pulmonary/Chest: Effort normal and breath sounds normal. No respiratory distress. She has no wheezes. She has no rales. She exhibits no tenderness.  Abdominal: Soft. Normal appearance and bowel sounds are normal. There is no hepatomegaly. There is tenderness in the right upper quadrant. There is no rigidity, no rebound, no guarding, no CVA tenderness and negative Murphy's sign.  Lymphadenopathy:    She has no cervical adenopathy.  Neurological: She is alert and oriented to person, place, and time.  Skin: Skin is warm and dry. No rash noted.  Psychiatric: Affect normal.    Recent Results (from the past 2160 hour(s))  BASIC METABOLIC PANEL     Status: Abnormal   Collection Time    08/20/13  3:20 PM      Result Value Ref Range   Sodium 135  135 - 145 mEq/L   Potassium 3.9  3.5 - 5.3 mEq/L   Chloride 101  96 - 112 mEq/L   CO2 22  19 - 32 mEq/L   Glucose, Bld 166 (*) 70 - 99 mg/dL   BUN 9  6 - 23 mg/dL   Creat 0.66  0.50 - 1.10 mg/dL   Calcium 9.0  8.4 - 10.5 mg/dL  MAGNESIUM     Status: Abnormal   Collection Time    08/20/13  3:20 PM      Result Value Ref Range   Magnesium 1.4 (*) 1.5 - 2.5 mg/dL   Assessment/Plan: RUQ pain Colicky in nature. Concern for cholelithiasis/choledocholithiasis. Will obtain CBC, CMP lipase and abdominal ultrasound. Rx tramadol for severe pain. Continue Phenergan for nausea. Discussed proper diet with patient. Patient instructed that if symptoms acutely worsen before evaluation can be completed, to proceed to the ER.

## 2013-09-16 NOTE — Assessment & Plan Note (Signed)
Colicky in nature. Concern for cholelithiasis/choledocholithiasis. Will obtain CBC, CMP lipase and abdominal ultrasound. Rx tramadol for severe pain. Continue Phenergan for nausea. Discussed proper diet with patient. Patient instructed that if symptoms acutely worsen before evaluation can be completed, to proceed to the ER.

## 2013-09-17 ENCOUNTER — Ambulatory Visit (HOSPITAL_BASED_OUTPATIENT_CLINIC_OR_DEPARTMENT_OTHER)
Admission: RE | Admit: 2013-09-17 | Discharge: 2013-09-17 | Disposition: A | Discharge status: Home / Self Care | Payer: 59 | Source: Ambulatory Visit | Attending: Physician Assistant | Admitting: Physician Assistant

## 2013-09-17 ENCOUNTER — Other Ambulatory Visit: Payer: Self-pay | Admitting: Physician Assistant

## 2013-09-17 DIAGNOSIS — R1011 Right upper quadrant pain: Secondary | ICD-10-CM

## 2013-09-17 DIAGNOSIS — R109 Unspecified abdominal pain: Secondary | ICD-10-CM | POA: Insufficient documentation

## 2013-09-17 DIAGNOSIS — R748 Abnormal levels of other serum enzymes: Secondary | ICD-10-CM

## 2013-09-17 DIAGNOSIS — R110 Nausea: Secondary | ICD-10-CM | POA: Insufficient documentation

## 2013-09-17 LAB — LIPASE: Lipase: 157 U/L — ABNORMAL HIGH (ref 0–75)

## 2013-09-17 NOTE — Addendum Note (Signed)
Addended by: Kelle Darting A on: 09/17/2013 07:17 AM   Modules accepted: Orders

## 2013-09-22 ENCOUNTER — Ambulatory Visit (INDEPENDENT_AMBULATORY_CARE_PROVIDER_SITE_OTHER): Payer: 59 | Admitting: Physician Assistant

## 2013-09-22 ENCOUNTER — Encounter: Payer: Self-pay | Admitting: Physician Assistant

## 2013-09-22 VITALS — BP 113/73 | HR 99 | Temp 97.5°F | Resp 16 | Ht 63.0 in | Wt 163.4 lb

## 2013-09-22 DIAGNOSIS — K5190 Ulcerative colitis, unspecified, without complications: Secondary | ICD-10-CM

## 2013-09-22 DIAGNOSIS — R1011 Right upper quadrant pain: Secondary | ICD-10-CM

## 2013-09-22 DIAGNOSIS — K859 Acute pancreatitis without necrosis or infection, unspecified: Secondary | ICD-10-CM | POA: Insufficient documentation

## 2013-09-22 LAB — COMPREHENSIVE METABOLIC PANEL
ALBUMIN: 3.7 g/dL (ref 3.5–5.2)
ALK PHOS: 58 U/L (ref 39–117)
ALT: 14 U/L (ref 0–35)
AST: 17 U/L (ref 0–37)
BILIRUBIN TOTAL: 0.5 mg/dL (ref 0.2–1.2)
BUN: 9 mg/dL (ref 6–23)
CO2: 25 mEq/L (ref 19–32)
CREATININE: 0.8 mg/dL (ref 0.4–1.2)
Calcium: 9.4 mg/dL (ref 8.4–10.5)
Chloride: 105 mEq/L (ref 96–112)
GFR: 86.79 mL/min (ref >60.00)
GLUCOSE: 158 mg/dL — AB (ref 70–99)
POTASSIUM: 4.1 meq/L (ref 3.5–5.1)
Sodium: 139 mEq/L (ref 135–145)
Total Protein: 6.6 g/dL (ref 6.0–8.3)

## 2013-09-22 LAB — LIPASE: Lipase: 55 U/L (ref 11.0–59.0)

## 2013-09-22 NOTE — Progress Notes (Signed)
Pre visit review using our clinic review tool, if applicable. No additional management support is needed unless otherwise documented below in the visit note/SLS  

## 2013-09-22 NOTE — Assessment & Plan Note (Signed)
Recent pancreatitis that has resolved.  Persistent RUQ pain.  Labs and US unremarkable.  Will refer to GI for HIDA scan and further evaluation.

## 2013-09-22 NOTE — Progress Notes (Signed)
Patient presents to clinic today for follow-up of RUQ pain and mild pancreatitis.  Patient was seen last week c/o postprandial RUQ pain with nausea and fatigue.  Labs obtained which were within normal limits except for lipase elevated to the low 300s. [place] was obtained and negative for stone, obstruction or pancreatic changes. Patient given medication for nausea and pain relief.  Instructed to stop alcohol consumption and stick to a clear liquid diet until pain resolves.  Repeat lipase the next day had halved.  Patient states that she has been doing ok over the weekend.  States pain has resolved.  Has been eating smaller meals and avoiding fatty foods. Denis alcohol consumption.  Denies nausea or vomiting.  Did have frequent loose stools the other day after a fatty meal.  Denies hematochezia, melena or tenesmus.  Patient with history of colitis.    Past Medical History  Diagnosis Date  . Asthma   . Depression   . Frequent headaches   . GERD (gastroesophageal reflux disease)   . Hay fever   . Environmental allergies   . Elevated blood pressure   . Hyperlipidemia   . Migraines   . Colon polyps   . Thyroid disease   . Chicken pox     Current Outpatient Prescriptions on File Prior to Visit  Medication Sig Dispense Refill  . acyclovir (ZOVIRAX) 400 MG tablet Take 1 tablet (400 mg total) by mouth 3 (three) times daily as needed (for 5 days).  30 tablet  1  . budesonide-formoterol (SYMBICORT) 160-4.5 MCG/ACT inhaler Inhale 2 puffs into the lungs 2 (two) times daily.      Marland Kitchen levalbuterol (XOPENEX HFA) 45 MCG/ACT inhaler Inhale 2 puffs into the lungs every 4 (four) hours as needed for wheezing (Q4-6 Hours PRN).      Marland Kitchen levothyroxine (SYNTHROID, LEVOTHROID) 100 MCG tablet Take 1 tablet (100 mcg total) by mouth daily before breakfast.  90 tablet  1  . mesalamine (LIALDA) 1.2 G EC tablet Take 2.4 g by mouth daily.      . montelukast (SINGULAIR) 10 MG tablet Take 10 mg by mouth every morning.      .  olmesartan (BENICAR) 40 MG tablet Take 1 tablet (40 mg total) by mouth at bedtime.  90 tablet  1  . Omega-3 Fatty Acids (FISH OIL) 1200 MG CAPS Take 2 capsules by mouth at bedtime.      . promethazine (PHENERGAN) 25 MG tablet Take 1 tablet (25 mg total) by mouth every 6 (six) hours as needed for nausea or vomiting.  30 tablet  3  . traMADol (ULTRAM) 50 MG tablet Take 1 tablet (50 mg total) by mouth every 8 (eight) hours as needed.  30 tablet  0  . triamcinolone (NASACORT ALLERGY 24HR) 55 MCG/ACT AERO nasal inhaler Place 1 spray into the nose daily.      Marland Kitchen venlafaxine XR (EFFEXOR-XR) 75 MG 24 hr capsule Take 1 capsule (75 mg total) by mouth daily with breakfast.  90 capsule  1  . zolpidem (AMBIEN) 5 MG tablet TAKE 1 TABLET BY MOUTH AT BEDTIME AS NEEDED  30 tablet  0   No current facility-administered medications on file prior to visit.    Allergies  Allergen Reactions  . Ace Inhibitors Other (See Comments)    Angina  . Cefaclor   . Lisinopril Cough  . Losartan Potassium   . Sulfonamide Derivatives     Family History  Problem Relation Age of Onset  . Arthritis Mother  54    Deceased  . Breast cancer Mother   . Lung cancer Mother   . Hyperlipidemia Mother   . Heart disease Mother   . Hypertension Mother   . Diabetes Mother   . Crohn's disease Mother   . Diabetes Maternal Grandfather   . Heart disease Maternal Grandfather   . Heart attack Maternal Grandfather   . Colitis Sister     History   Social History  . Marital Status: Married    Spouse Name: N/A    Number of Children: N/A  . Years of Education: N/A   Social History Main Topics  . Smoking status: Former Research scientist (life sciences)  . Smokeless tobacco: Never Used     Comment: Quit >20 yrs ago  . Alcohol Use: 1.8 oz/week    3 Glasses of wine per week  . Drug Use: No  . Sexual Activity: None   Other Topics Concern  . None   Social History Narrative  . None   Review of Systems - See HPI.  All other ROS are negative.  BP 113/73   Pulse 99  Temp(Src) 97.5 F (36.4 C) (Oral)  Resp 16  Ht 5' 3"  (1.6 m)  Wt 163 lb 6 oz (74.106 kg)  BMI 28.95 kg/m2  SpO2 98%  Physical Exam  Constitutional: She is oriented to person, place, and time and well-developed, well-nourished, and in no distress.  HENT:  Head: Normocephalic and atraumatic.  Eyes: Conjunctivae are normal. Pupils are equal, round, and reactive to light.  Cardiovascular: Normal rate, regular rhythm, normal heart sounds and intact distal pulses.   Pulmonary/Chest: Effort normal and breath sounds normal. No respiratory distress. She has no wheezes. She has no rales. She exhibits no tenderness.  Abdominal: Soft. Bowel sounds are normal. She exhibits no distension and no mass. There is no rebound and no guarding.  Mild RUQ tenderness with negative murphy sign.  Neurological: She is alert and oriented to person, place, and time.  Skin: Skin is warm and dry. No rash noted.  Psychiatric: Affect normal.   Recent Results (from the past 2160 hour(s))  BASIC METABOLIC PANEL     Status: Abnormal   Collection Time    08/20/13  3:20 PM      Result Value Ref Range   Sodium 135  135 - 145 mEq/L   Potassium 3.9  3.5 - 5.3 mEq/L   Chloride 101  96 - 112 mEq/L   CO2 22  19 - 32 mEq/L   Glucose, Bld 166 (*) 70 - 99 mg/dL   BUN 9  6 - 23 mg/dL   Creat 0.66  0.50 - 1.10 mg/dL   Calcium 9.0  8.4 - 10.5 mg/dL  MAGNESIUM     Status: Abnormal   Collection Time    08/20/13  3:20 PM      Result Value Ref Range   Magnesium 1.4 (*) 1.5 - 2.5 mg/dL  CBC     Status: None   Collection Time    09/16/13  8:02 AM      Result Value Ref Range   WBC 10.4  4.0 - 10.5 K/uL   RBC 4.39  3.87 - 5.11 MIL/uL   Hemoglobin 13.3  12.0 - 15.0 g/dL   HCT 37.7  36.0 - 46.0 %   MCV 85.9  78.0 - 100.0 fL   MCH 30.3  26.0 - 34.0 pg   MCHC 35.3  30.0 - 36.0 g/dL   RDW 13.9  11.5 -  15.5 %   Platelets 383  150 - 400 K/uL  COMPREHENSIVE METABOLIC PANEL     Status: Abnormal   Collection Time     09/16/13  8:02 AM      Result Value Ref Range   Sodium 140  135 - 145 mEq/L   Potassium 4.2  3.5 - 5.3 mEq/L   Chloride 102  96 - 112 mEq/L   CO2 26  19 - 32 mEq/L   Glucose, Bld 117 (*) 70 - 99 mg/dL   BUN 6  6 - 23 mg/dL   Creat 0.66  0.50 - 1.10 mg/dL   Total Bilirubin 0.5  0.2 - 1.2 mg/dL   Alkaline Phosphatase 69  39 - 117 U/L   AST 11  0 - 37 U/L   ALT 12  0 - 35 U/L   Total Protein 6.6  6.0 - 8.3 g/dL   Albumin 4.2  3.5 - 5.2 g/dL   Calcium 9.6  8.4 - 10.5 mg/dL  LIPASE     Status: Abnormal   Collection Time    09/16/13  8:02 AM      Result Value Ref Range   Lipase 303 (*) 0 - 75 U/L  MAGNESIUM     Status: None   Collection Time    09/16/13  8:02 AM      Result Value Ref Range   Magnesium 1.6  1.5 - 2.5 mg/dL  LIPASE     Status: Abnormal   Collection Time    09/17/13  9:59 AM      Result Value Ref Range   Lipase 157 (*) 0 - 75 U/L   Assessment/Plan: Pancreatitis Resolving. Patient tolerating solid foods without pain.  Continue small meals.  Avoid alcohol.  Will repeat CMP and lipase.   RUQ pain Recent pancreatitis that has resolved.  Persistent RUQ pain.  Labs and US unremarkable.  Will refer to GI for HIDA scan and further evaluation.

## 2013-09-22 NOTE — Assessment & Plan Note (Signed)
Resolving. Patient tolerating solid foods without pain.  Continue small meals.  Avoid alcohol.  Will repeat CMP and lipase.

## 2013-09-22 NOTE — Patient Instructions (Signed)
Continue avoidance of fried, fatty foods.  Stay well hydrated.  Eat smaller meals.  I am glad your symptoms have improved.  I will call you with your lab results.  You will be contacted for an appointment with Gastroenterology.  Call or return to clinic if you need anything.

## 2013-09-29 ENCOUNTER — Telehealth: Payer: Self-pay | Admitting: *Deleted

## 2013-09-29 NOTE — Telephone Encounter (Signed)
Patient states that she has not received any response for the GI Referral placed 06.08.15 [request for new provider, previously seen by Dr. Earlean Shawl; per [facility] [Jennifer], request was placed for [facility] , who will not schedule appointment until records are received from Dr. Liliane Channel office/SLS [facility] with contact name and number RE: this matter with detailed instructions/SLS

## 2013-10-15 ENCOUNTER — Other Ambulatory Visit: Payer: Self-pay | Admitting: Physician Assistant

## 2013-10-15 DIAGNOSIS — G4700 Insomnia, unspecified: Secondary | ICD-10-CM

## 2013-10-15 NOTE — Telephone Encounter (Signed)
Refill granted.  Rx printed, signed and given to Slater for fax.

## 2013-10-15 NOTE — Telephone Encounter (Signed)
eScribe request from [facility] for refill on Zolpidem Last filled - 06.03.15, #[id number] Last AEX - 06.08.15 Next AEX - Not Stated Please Advise on refills/SLS

## 2013-10-15 NOTE — Telephone Encounter (Signed)
Rx request faxed to pharmacy/SLS  

## 2013-11-26 ENCOUNTER — Encounter: Payer: Self-pay | Admitting: Physician Assistant

## 2013-11-26 ENCOUNTER — Ambulatory Visit (INDEPENDENT_AMBULATORY_CARE_PROVIDER_SITE_OTHER): Payer: 59 | Admitting: Physician Assistant

## 2013-11-26 ENCOUNTER — Telehealth: Payer: Self-pay | Admitting: Physician Assistant

## 2013-11-26 VITALS — BP 124/86 | HR 75 | Temp 98.3°F | Resp 16 | Ht 63.0 in | Wt 170.2 lb

## 2013-11-26 DIAGNOSIS — E039 Hypothyroidism, unspecified: Secondary | ICD-10-CM

## 2013-11-26 DIAGNOSIS — I10 Essential (primary) hypertension: Secondary | ICD-10-CM

## 2013-11-26 DIAGNOSIS — E781 Pure hyperglyceridemia: Secondary | ICD-10-CM

## 2013-11-26 DIAGNOSIS — E785 Hyperlipidemia, unspecified: Secondary | ICD-10-CM

## 2013-11-26 DIAGNOSIS — G4700 Insomnia, unspecified: Secondary | ICD-10-CM

## 2013-11-26 DIAGNOSIS — E782 Mixed hyperlipidemia: Secondary | ICD-10-CM | POA: Insufficient documentation

## 2013-11-26 HISTORY — DX: Mixed hyperlipidemia: E78.2

## 2013-11-26 LAB — LIPID PANEL
CHOL/HDL RATIO: 5.2 ratio
Cholesterol: 233 mg/dL — ABNORMAL HIGH (ref 0–200)
HDL: 45 mg/dL (ref >39)
LDL Cholesterol: 131 mg/dL — ABNORMAL HIGH (ref 0–99)
TRIGLYCERIDES: 286 mg/dL — AB (ref <150)
VLDL: 57 mg/dL — AB (ref 0–40)

## 2013-11-26 LAB — COMPREHENSIVE METABOLIC PANEL
ALK PHOS: 58 U/L (ref 39–117)
ALT: 10 U/L (ref 0–35)
AST: 12 U/L (ref 0–37)
Albumin: 4.2 g/dL (ref 3.5–5.2)
BUN: 8 mg/dL (ref 6–23)
CALCIUM: 9.5 mg/dL (ref 8.4–10.5)
CO2: 28 mEq/L (ref 19–32)
CREATININE: 0.7 mg/dL (ref 0.50–1.10)
Chloride: 103 mEq/L (ref 96–112)
GLUCOSE: 109 mg/dL — AB (ref 70–99)
POTASSIUM: 4 meq/L (ref 3.5–5.3)
Sodium: 138 mEq/L (ref 135–145)
Total Bilirubin: 0.7 mg/dL (ref 0.2–1.2)
Total Protein: 6.7 g/dL (ref 6.0–8.3)

## 2013-11-26 LAB — TSH: TSH: 2.126 u[IU]/mL (ref 0.350–4.500)

## 2013-11-26 LAB — LIPASE: Lipase: 29 U/L (ref 0–75)

## 2013-11-26 MED ORDER — ZOLPIDEM TARTRATE 5 MG PO TABS: ORAL_TABLET | ORAL | Status: DC

## 2013-11-26 MED ORDER — LEVOTHYROXINE SODIUM 100 MCG PO TABS: 100.0000 ug | ORAL_TABLET | Freq: Every day | ORAL | Status: DC

## 2013-11-26 NOTE — Progress Notes (Signed)
Patient presents to clinic today for 9-monthfollow-up.  Patient endorses taking her medications as directed.  In terms of hypertension, patient denies chest pain, palpitations, lightheadedness, headaches or vision changes.  BP 124/86 in clinic today.   Patient also endorses taking her levothyroxine as directed.  Is due for repeat labs.  Anxiety and depression remain well controlled with current regimen.  Denies SI/HI.  Patient still having rare, but significant RUQ pain despite negative GI workup.  Is requesting recheck of her lipase levels.  Denies fever, chills, nausea/vomiting, or change to bowel of bladder habits.  Past Medical History  Diagnosis Date  . Asthma   . Depression   . Frequent headaches   . GERD (gastroesophageal reflux disease)   . Hay fever   . Environmental allergies   . Elevated blood pressure   . Hyperlipidemia   . Migraines   . Colon polyps   . Thyroid disease   . Chicken pox     Current Outpatient Prescriptions on File Prior to Visit  Medication Sig Dispense Refill  . acyclovir (ZOVIRAX) 400 MG tablet Take 1 tablet (400 mg total) by mouth 3 (three) times daily as needed (for 5 days).  30 tablet  1  . budesonide-formoterol (SYMBICORT) 160-4.5 MCG/ACT inhaler Inhale 2 puffs into the lungs 2 (two) times daily.      .Marland Kitchenlevalbuterol (XOPENEX HFA) 45 MCG/ACT inhaler Inhale 2 puffs into the lungs every 4 (four) hours as needed for wheezing (Q4-6 Hours PRN).      .Marland Kitchenlevothyroxine (SYNTHROID, LEVOTHROID) 100 MCG tablet Take 1 tablet (100 mcg total) by mouth daily before breakfast.  90 tablet  1  . mesalamine (LIALDA) 1.2 G EC tablet Take 2.4 g by mouth daily.      . montelukast (SINGULAIR) 10 MG tablet Take 10 mg by mouth every morning.      . olmesartan (BENICAR) 40 MG tablet Take 1 tablet (40 mg total) by mouth at bedtime.  90 tablet  1  . Omega-3 Fatty Acids (FISH OIL) 1200 MG CAPS Take 2 capsules by mouth at bedtime.      . promethazine (PHENERGAN) 25 MG tablet Take 1  tablet (25 mg total) by mouth every 6 (six) hours as needed for nausea or vomiting.  30 tablet  3  . traMADol (ULTRAM) 50 MG tablet Take 1 tablet (50 mg total) by mouth every 8 (eight) hours as needed.  30 tablet  0  . triamcinolone (NASACORT ALLERGY 24HR) 55 MCG/ACT AERO nasal inhaler Place 1 spray into the nose daily.      .Marland Kitchenvenlafaxine XR (EFFEXOR-XR) 75 MG 24 hr capsule Take 1 capsule (75 mg total) by mouth daily with breakfast.  90 capsule  1   No current facility-administered medications on file prior to visit.    Allergies  Allergen Reactions  . Ace Inhibitors Other (See Comments)    Angina  . Cefaclor   . Lisinopril Cough  . Losartan Potassium   . Sulfonamide Derivatives     Family History  Problem Relation Age of Onset  . Arthritis Mother 540   Deceased  . Breast cancer Mother   . Lung cancer Mother   . Hyperlipidemia Mother   . Heart disease Mother   . Hypertension Mother   . Diabetes Mother   . Crohn's disease Mother   . Diabetes Maternal Grandfather   . Heart disease Maternal Grandfather   . Heart attack Maternal Grandfather   . Colitis Sister  History   Social History  . Marital Status: Married    Spouse Name: N/A    Number of Children: N/A  . Years of Education: N/A   Social History Main Topics  . Smoking status: Former Research scientist (life sciences)  . Smokeless tobacco: Never Used     Comment: Quit >20 yrs ago  . Alcohol Use: 1.8 oz/week    3 Glasses of wine per week  . Drug Use: No  . Sexual Activity: None   Other Topics Concern  . None   Social History Narrative  . None    Review of Systems - See HPI.  All other ROS are negative.  BP 124/86  Pulse 75  Temp(Src) 98.3 F (36.8 C) (Oral)  Resp 16  Ht 5' 3"  (1.6 m)  Wt 170 lb 4 oz (77.225 kg)  BMI 30.17 kg/m2  SpO2 96%  Physical Exam  Vitals reviewed. Constitutional: She is oriented to person, place, and time and well-developed, well-nourished, and in no distress.  HENT:  Head: Normocephalic and  atraumatic.  Eyes: Conjunctivae are normal. Pupils are equal, round, and reactive to light.  Neck: Neck supple.  Cardiovascular: Normal rate, regular rhythm, normal heart sounds and intact distal pulses.   Pulmonary/Chest: Effort normal and breath sounds normal. No respiratory distress. She has no wheezes. She has no rales. She exhibits no tenderness.  Abdominal: Soft. Bowel sounds are normal. She exhibits no distension. There is no tenderness.  Neurological: She is alert and oriented to person, place, and time.  Skin: Skin is warm and dry. No rash noted.  Psychiatric: Affect normal.    Recent Results (from the past 2160 hour(s))  CBC     Status: None   Collection Time    09/16/13  8:02 AM      Result Value Ref Range   WBC 10.4  4.0 - 10.5 K/uL   RBC 4.39  3.87 - 5.11 MIL/uL   Hemoglobin 13.3  12.0 - 15.0 g/dL   HCT 37.7  36.0 - 46.0 %   MCV 85.9  78.0 - 100.0 fL   MCH 30.3  26.0 - 34.0 pg   MCHC 35.3  30.0 - 36.0 g/dL   RDW 13.9  11.5 - 15.5 %   Platelets 383  150 - 400 K/uL  COMPREHENSIVE METABOLIC PANEL     Status: Abnormal   Collection Time    09/16/13  8:02 AM      Result Value Ref Range   Sodium 140  135 - 145 mEq/L   Potassium 4.2  3.5 - 5.3 mEq/L   Chloride 102  96 - 112 mEq/L   CO2 26  19 - 32 mEq/L   Glucose, Bld 117 (*) 70 - 99 mg/dL   BUN 6  6 - 23 mg/dL   Creat 0.66  0.50 - 1.10 mg/dL   Total Bilirubin 0.5  0.2 - 1.2 mg/dL   Alkaline Phosphatase 69  39 - 117 U/L   AST 11  0 - 37 U/L   ALT 12  0 - 35 U/L   Total Protein 6.6  6.0 - 8.3 g/dL   Albumin 4.2  3.5 - 5.2 g/dL   Calcium 9.6  8.4 - 10.5 mg/dL  LIPASE     Status: Abnormal   Collection Time    09/16/13  8:02 AM      Result Value Ref Range   Lipase 303 (*) 0 - 75 U/L  MAGNESIUM     Status: None  Collection Time    09/16/13  8:02 AM      Result Value Ref Range   Magnesium 1.6  1.5 - 2.5 mg/dL  LIPASE     Status: Abnormal   Collection Time    09/17/13  9:59 AM      Result Value Ref Range    Lipase 157 (*) 0 - 75 U/L  COMPREHENSIVE METABOLIC PANEL     Status: Abnormal   Collection Time    09/22/13  9:48 AM      Result Value Ref Range   Sodium 139  135 - 145 mEq/L   Potassium 4.1  3.5 - 5.1 mEq/L   Chloride 105  96 - 112 mEq/L   CO2 25  19 - 32 mEq/L   Glucose, Bld 158 (*) 70 - 99 mg/dL   BUN 9  6 - 23 mg/dL   Creatinine, Ser 0.8  0.4 - 1.2 mg/dL   Total Bilirubin 0.5  0.2 - 1.2 mg/dL   Alkaline Phosphatase 58  39 - 117 U/L   AST 17  0 - 37 U/L   ALT 14  0 - 35 U/L   Total Protein 6.6  6.0 - 8.3 g/dL   Albumin 3.7  3.5 - 5.2 g/dL   Calcium 9.4  8.4 - 10.5 mg/dL   GFR 86.79  >60.00 mL/min  LIPASE     Status: None   Collection Time    09/22/13  9:48 AM      Result Value Ref Range   Lipase 55.0  11.0 - 59.0 U/L   Assessment/Plan: Hypertension Well controlled.  Medications refilled.  Continue current regimen.  HYPOTHYROIDISM Will obtain TSH levels today.  Continue current medications for now.  Will alter dose if indicated by results.  HYPERLIPIDEMIA Will obtain fasting lipid profile and CMP.  Continue fish oil supplement.   Insomnia Medications refilled.

## 2013-11-26 NOTE — Assessment & Plan Note (Signed)
Will obtain fasting lipid profile and CMP.  Continue fish oil supplement.

## 2013-11-26 NOTE — Progress Notes (Signed)
Pre visit review using our clinic review tool, if applicable. No additional management support is needed unless otherwise documented below in the visit note/SLS  

## 2013-11-26 NOTE — Assessment & Plan Note (Signed)
Well controlled.  Medications refilled.  Continue current regimen.

## 2013-11-26 NOTE — Patient Instructions (Signed)
Please obtain blood work.  I will call you with your results.  Continue medications as directed.  Follow-up with GI as scheduled. If everything looks good, I will see you in 6 months or when you are due for your annual exam.  Return sooner if needed.  Hypothyroidism The thyroid is a large gland located in the lower front of your neck. The thyroid gland helps control metabolism. Metabolism is how your body handles food. It controls metabolism with the hormone thyroxine. When this gland is underactive (hypothyroid), it produces too little hormone.  CAUSES These include:   Absence or destruction of thyroid tissue.  Goiter due to iodine deficiency.  Goiter due to medications.  Congenital defects (since birth).  Problems with the pituitary. This causes a lack of TSH (thyroid stimulating hormone). This hormone tells the thyroid to turn out more hormone. SYMPTOMS  Lethargy (feeling as though you have no energy)  Cold intolerance  Weight gain (in spite of normal food intake)  Dry skin  Coarse hair  Menstrual irregularity (if severe, may lead to infertility)  Slowing of thought processes Cardiac problems are also caused by insufficient amounts of thyroid hormone. Hypothyroidism in the newborn is cretinism, and is an extreme form. It is important that this form be treated adequately and immediately or it will lead rapidly to retarded physical and mental development. DIAGNOSIS  To prove hypothyroidism, your caregiver may do blood tests and ultrasound tests. Sometimes the signs are hidden. It may be necessary for your caregiver to watch this illness with blood tests either before or after diagnosis and treatment. TREATMENT  Low levels of thyroid hormone are increased by using synthetic thyroid hormone. This is a safe, effective treatment. It usually takes about four weeks to gain the full effects of the medication. After you have the full effect of the medication, it will generally take  another four weeks for problems to leave. Your caregiver may start you on low doses. If you have had heart problems the dose may be gradually increased. It is generally not an emergency to get rapidly to normal. HOME CARE INSTRUCTIONS   Take your medications as your caregiver suggests. Let your caregiver know of any medications you are taking or start taking. Your caregiver will help you with dosage schedules.  As your condition improves, your dosage needs may increase. It will be necessary to have continuing blood tests as suggested by your caregiver.  Report all suspected medication side effects to your caregiver. SEEK MEDICAL CARE IF: Seek medical care if you develop:  Sweating.  Tremulousness (tremors).  Anxiety.  Rapid weight loss.  Heat intolerance.  Emotional swings.  Diarrhea.  Weakness. SEEK IMMEDIATE MEDICAL CARE IF:  You develop chest pain, an irregular heart beat (palpitations), or a rapid heart beat. MAKE SURE YOU:   Understand these instructions.  Will watch your condition.  Will get help right away if you are not doing well or get worse. Document Released: 04/03/2005 Document Revised: 06/26/2011 Document Reviewed: 11/22/2007 [facility] Patient Information 2015 [place], [place]. This information is not intended to replace advice given to you by your health care provider. Make sure you discuss any questions you have with your health care provider.

## 2013-11-26 NOTE — Assessment & Plan Note (Signed)
Will obtain TSH levels today.  Continue current medications for now.  Will alter dose if indicated by results.

## 2013-11-26 NOTE — Telephone Encounter (Signed)
Lab results are in -- Thyroid levels remain good.  I have sent in a refill of her levothyroxine.  Lipase is normal.  Her Cholesterol -- TGL and LDL have increased.  Because of history of pancreatitis and non-alcoholic fatty liver, will need to start a medication to lower these levels.  She has been intolerant to multiple statins.  I would like to call in branded Lofibra to try to lower her levels.  I know she has trouble with generic fibrates before.  Please verify that she is ok with me sending in this medication.  Follow-up 3 months.

## 2013-11-26 NOTE — Assessment & Plan Note (Signed)
Medications refilled

## 2013-11-28 NOTE — Telephone Encounter (Signed)
Pt left message returning Sharon's call, can be reached back at [phone]

## 2013-11-28 NOTE — Telephone Encounter (Signed)
Please Advise on refills.

## 2013-11-28 NOTE — Telephone Encounter (Signed)
Patient left message stating her insurance will cover the generic medication, she would like it called to [facility]

## 2013-11-28 NOTE — Telephone Encounter (Signed)
Spoke with pt. She is aware. "She wanted to look over the new drug that she's being prescribed and will give me a call back."?LDM

## 2013-11-28 NOTE — Telephone Encounter (Signed)
LMOM with contact name and number for return call RE: results and further provider instructions/SLS

## 2013-11-30 MED ORDER — FENOFIBRATE 145 MG PO TABS: 145.0000 mg | ORAL_TABLET | Freq: Every day | ORAL | Status: DC

## 2013-11-30 NOTE — Telephone Encounter (Signed)
Generic fenofibrate sent to pharmacy.

## 2013-12-01 NOTE — Telephone Encounter (Signed)
Received PA on Fenofibrate, forward to nurse

## 2013-12-03 MED ORDER — FENOFIBRATE 54 MG PO TABS: 108.0000 mg | ORAL_TABLET | Freq: Every day | ORAL | Status: DC

## 2013-12-03 MED ORDER — FENOFIBRATE 54 MG PO TABS: 54.0000 mg | ORAL_TABLET | Freq: Every day | ORAL | Status: DC

## 2013-12-03 NOTE — Telephone Encounter (Signed)
called pt, no answer. Left message on voice mail to return call at office. LDM

## 2013-12-03 NOTE — Telephone Encounter (Signed)
Insurance will not cover 145 mg tablet.  Will cover 54 mg tablets.  Will send in Rx for 54 mg tablets.  Patient to take 2 tablets daily.  Will recheck cholesterol and TGL in 3 months.

## 2013-12-25 ENCOUNTER — Telehealth: Payer: Self-pay | Admitting: Physician Assistant

## 2013-12-25 NOTE — Telephone Encounter (Signed)
We can check patient's blood sugar and A1C if indicated.  Would like to see her in office to discuss her symptoms and to examine her prior to labs, in case there is any indication other labs may be needed.

## 2013-12-25 NOTE — Telephone Encounter (Signed)
Pt is requesting an order to get her blood sugar tested. States she has been very thirsty and have a strange taste in mouth.

## 2013-12-26 NOTE — Telephone Encounter (Signed)
Pt has appt on 12/30/13.

## 2013-12-29 ENCOUNTER — Ambulatory Visit: Payer: 59 | Admitting: Physician Assistant

## 2013-12-30 ENCOUNTER — Ambulatory Visit: Payer: 59 | Admitting: Physician Assistant

## 2014-01-02 ENCOUNTER — Ambulatory Visit: Payer: 59 | Admitting: Physician Assistant

## 2014-02-13 ENCOUNTER — Ambulatory Visit (INDEPENDENT_AMBULATORY_CARE_PROVIDER_SITE_OTHER): Payer: 59 | Admitting: Physician Assistant

## 2014-02-13 ENCOUNTER — Encounter: Payer: Self-pay | Admitting: Physician Assistant

## 2014-02-13 VITALS — BP 122/77 | HR 84 | Temp 98.6°F | Resp 16 | Ht 63.0 in | Wt 172.2 lb

## 2014-02-13 DIAGNOSIS — J019 Acute sinusitis, unspecified: Secondary | ICD-10-CM

## 2014-02-13 MED ORDER — AMOXICILLIN-POT CLAVULANATE 875-125 MG PO TABS: 1.0000 | ORAL_TABLET | Freq: Two times a day (BID) | ORAL | Status: DC

## 2014-02-13 NOTE — Assessment & Plan Note (Signed)
Concern for bacterial etiology giving symptom duration, worsening of symptoms, and TTP. Rx Antibiotics.  Increase fluids.  Rest.  Saline nasal spray.  Probiotic.  Mucinex as directed.  Humidifier in bedroom.  Call or return to clinic if symptoms are not improving.

## 2014-02-13 NOTE — Progress Notes (Signed)
History of Present Illness: Erin Fields is a 48 y.o. female who present to the clinic today complaining of sinus pressure, sinus pain and nasal congestion x 1.5 weeks.  Patient endorses L ear pain and pressure, tooth pain x 2 days.  Patient denies fever, SOB or pleuritic chest pain.  Denies recent travel.   History: Past Medical History  Diagnosis Date  . Asthma   . Depression   . Frequent headaches   . GERD (gastroesophageal reflux disease)   . Hay fever   . Environmental allergies   . Elevated blood pressure   . Hyperlipidemia   . Migraines   . Colon polyps   . Thyroid disease   . Chicken pox    Current outpatient prescriptions:acyclovir (ZOVIRAX) 400 MG tablet, Take 1 tablet (400 mg total) by mouth 3 (three) times daily as needed (for 5 days)., Disp: 30 tablet, Rfl: 1;  budesonide-formoterol (SYMBICORT) 160-4.5 MCG/ACT inhaler, Inhale 2 puffs into the lungs 2 (two) times daily., Disp: , Rfl: ;  fenofibrate 54 MG tablet, Take 2 tablets (108 mg total) by mouth daily., Disp: 60 tablet, Rfl: 3 levalbuterol (XOPENEX HFA) 45 MCG/ACT inhaler, Inhale 2 puffs into the lungs every 4 (four) hours as needed for wheezing (Q4-6 Hours PRN)., Disp: , Rfl: ;  levothyroxine (SYNTHROID, LEVOTHROID) 100 MCG tablet, Take 1 tablet (100 mcg total) by mouth daily before breakfast., Disp: 90 tablet, Rfl: 1;  mesalamine (LIALDA) 1.2 G EC tablet, Take 2.4 g by mouth daily., Disp: , Rfl:  montelukast (SINGULAIR) 10 MG tablet, Take 10 mg by mouth every morning., Disp: , Rfl: ;  olmesartan (BENICAR) 40 MG tablet, Take 1 tablet (40 mg total) by mouth at bedtime., Disp: 90 tablet, Rfl: 1;  Omega-3 Fatty Acids (FISH OIL) 1200 MG CAPS, Take 2 capsules by mouth at bedtime., Disp: , Rfl:  promethazine (PHENERGAN) 25 MG tablet, Take 1 tablet (25 mg total) by mouth every 6 (six) hours as needed for nausea or vomiting., Disp: 30 tablet, Rfl: 3;  traMADol (ULTRAM) 50 MG tablet, Take 1 tablet (50 mg total) by mouth every 8  (eight) hours as needed., Disp: 30 tablet, Rfl: 0;  triamcinolone (NASACORT ALLERGY 24HR) 55 MCG/ACT AERO nasal inhaler, Place 1 spray into the nose daily., Disp: , Rfl:  venlafaxine XR (EFFEXOR-XR) 75 MG 24 hr capsule, Take 1 capsule (75 mg total) by mouth daily with breakfast., Disp: 90 capsule, Rfl: 1;  zolpidem (AMBIEN) 5 MG tablet, TAKE 1 TABLET BY MOUTH EVERY NIGHT AT BEDTIME AS NEEDED, Disp: 30 tablet, Rfl: 2;  amoxicillin-clavulanate (AUGMENTIN) 875-125 MG per tablet, Take 1 tablet by mouth 2 (two) times daily., Disp: 20 tablet, Rfl: 0 Allergies  Allergen Reactions  . Ace Inhibitors Other (See Comments)    Angina  . Cefaclor   . Lisinopril Cough  . Losartan Potassium   . Sulfonamide Derivatives    Family History  Problem Relation Age of Onset  . Arthritis Mother 27    Deceased  . Breast cancer Mother   . Lung cancer Mother   . Hyperlipidemia Mother   . Heart disease Mother   . Hypertension Mother   . Diabetes Mother   . Crohn's disease Mother   . Diabetes Maternal Grandfather   . Heart disease Maternal Grandfather   . Heart attack Maternal Grandfather   . Colitis Sister    History   Social History  . Marital Status: Married    Spouse Name: N/A    Number of  Children: N/A  . Years of Education: N/A   Social History Main Topics  . Smoking status: Former Research scientist (life sciences)  . Smokeless tobacco: Never Used     Comment: Quit >20 yrs ago  . Alcohol Use: 1.8 oz/week    3 Glasses of wine per week  . Drug Use: No  . Sexual Activity: None   Other Topics Concern  . None   Social History Narrative  . None    Review of Systems: See HPI.  All other ROS are negative.  Physical Examination: BP 122/77  Pulse 84  Temp(Src) 98.6 F (37 C) (Oral)  Resp 16  Ht 5' 3"  (1.6 m)  Wt 172 lb 4 oz (78.132 kg)  BMI 30.52 kg/m2  SpO2 96%  General appearance: alert, cooperative and appears stated age Head: Normocephalic, without obvious abnormality, atraumatic, sinuses tender to  percussion Eyes: conjunctivae/corneas clear. PERRL, EOM's intact. Fundi benign. Ears: normal TM's and external ear canals both ears Nose: moderate congestion, turbinates swollen, sinus tenderness left Throat: lips, mucosa, and tongue normal; teeth and gums normal Neck: no adenopathy, no carotid bruit, no JVD, supple, symmetrical, trachea midline and thyroid not enlarged, symmetric, no tenderness/mass/nodules Lungs: clear to auscultation bilaterally Chest wall: no tenderness  Assessment/Plan: Acute sinusitis with symptoms > 10 days Concern for bacterial etiology giving symptom duration, worsening of symptoms, and TTP. Rx Antibiotics.  Increase fluids.  Rest.  Saline nasal spray.  Probiotic.  Mucinex as directed.  Humidifier in bedroom.  Call or return to clinic if symptoms are not improving.

## 2014-02-13 NOTE — Progress Notes (Signed)
Pre visit review using our clinic review tool, if applicable. No additional management support is needed unless otherwise documented below in the visit note/SLS  

## 2014-02-13 NOTE — Patient Instructions (Signed)
Please take antibiotic as directed.  Increase fluid intake.  Use Saline nasal spray.  Take a daily multivitamin. Rest.  Place a humidifier in the bedroom.  Please call or return clinic if symptoms are not improving.  Sinusitis Sinusitis is redness, soreness, and swelling (inflammation) of the paranasal sinuses. Paranasal sinuses are air pockets within the bones of your face (beneath the eyes, the middle of the forehead, or above the eyes). In healthy paranasal sinuses, mucus is able to drain out, and air is able to circulate through them by way of your nose. However, when your paranasal sinuses are inflamed, mucus and air can become trapped. This can allow bacteria and other germs to grow and cause infection. Sinusitis can develop quickly and last only a short time (acute) or continue over a long period (chronic). Sinusitis that lasts for more than 12 weeks is considered chronic.  CAUSES  Causes of sinusitis include:  Allergies.  Structural abnormalities, such as displacement of the cartilage that separates your nostrils (deviated septum), which can decrease the air flow through your nose and sinuses and affect sinus drainage.  Functional abnormalities, such as when the small hairs (cilia) that line your sinuses and help remove mucus do not work properly or are not present. SYMPTOMS  Symptoms of acute and chronic sinusitis are the same. The primary symptoms are pain and pressure around the affected sinuses. Other symptoms include:  Upper toothache.  Earache.  Headache.  Bad breath.  Decreased sense of smell and taste.  A cough, which worsens when you are lying flat.  Fatigue.  Fever.  Thick drainage from your nose, which often is green and may contain pus (purulent).  Swelling and warmth over the affected sinuses. DIAGNOSIS  Your caregiver will perform a physical exam. During the exam, your caregiver may:  Look in your nose for signs of abnormal growths in your nostrils (nasal  polyps).  Tap over the affected sinus to check for signs of infection.  View the inside of your sinuses (endoscopy) with a special imaging device with a light attached (endoscope), which is inserted into your sinuses. If your caregiver suspects that you have chronic sinusitis, one or more of the following tests may be recommended:  Allergy tests.  Nasal culture A sample of mucus is taken from your nose and sent to a lab and screened for bacteria.  Nasal cytology A sample of mucus is taken from your nose and examined by your caregiver to determine if your sinusitis is related to an allergy. TREATMENT  Most cases of acute sinusitis are related to a viral infection and will resolve on their own within 10 days. Sometimes medicines are prescribed to help relieve symptoms (pain medicine, decongestants, nasal steroid sprays, or saline sprays).  However, for sinusitis related to a bacterial infection, your caregiver will prescribe antibiotic medicines. These are medicines that will help kill the bacteria causing the infection.  Rarely, sinusitis is caused by a fungal infection. In theses cases, your caregiver will prescribe antifungal medicine. For some cases of chronic sinusitis, surgery is needed. Generally, these are cases in which sinusitis recurs more than 3 times per year, despite other treatments. HOME CARE INSTRUCTIONS   Drink plenty of water. Water helps thin the mucus so your sinuses can drain more easily.  Use a humidifier.  Inhale steam 3 to 4 times a day (for example, sit in the bathroom with the shower running).  Apply a warm, moist washcloth to your face 3 to 4 times a  day, or as directed by your caregiver.  Use saline nasal sprays to help moisten and clean your sinuses.  Take over-the-counter or prescription medicines for pain, discomfort, or fever only as directed by your caregiver. SEEK IMMEDIATE MEDICAL CARE IF:  You have increasing pain or severe headaches.  You have  nausea, vomiting, or drowsiness.  You have swelling around your face.  You have vision problems.  You have a stiff neck.  You have difficulty breathing. MAKE SURE YOU:   Understand these instructions.  Will watch your condition.  Will get help right away if you are not doing well or get worse. Document Released: 04/03/2005 Document Revised: 06/26/2011 Document Reviewed: 04/18/2011 [facility] Patient Information 2014 [place], [place].

## 2014-02-14 ENCOUNTER — Other Ambulatory Visit: Payer: Self-pay | Admitting: Physician Assistant

## 2014-02-15 LAB — HM MAMMOGRAPHY: HM MAMMO: NORMAL

## 2014-02-15 LAB — HM PAP SMEAR: HM Pap smear: NORMAL

## 2014-02-16 ENCOUNTER — Other Ambulatory Visit: Payer: Self-pay | Admitting: Physician Assistant

## 2014-02-16 NOTE — Telephone Encounter (Signed)
Drug-Allergy Interaction Report LOSARTAN POTASSIUM  Significance: Drug Class Match VERY HIGH  Allergen Type Severity Reactions Comments  LOSARTAN POTASSIUM None specified None specified None specified    Allergy last edited on: 04/14/13 at 0739 by Rockwell Germany, CMA Allergies last reviewed on: 02/13/14 at 0930 by Rockwell Germany, CMA  Reason: [place] is a class of the listed allergen and medication.  Medications/Orders  1. BENICAR Order: BENICAR 40 MG tablet [Pharmacy Med Name: BENICAR 40MG TABLETS] Route: none Start: 02/16/2014 End: none Frequency:    Please advise on refills/SLS

## 2014-02-16 NOTE — Telephone Encounter (Signed)
Medication Detail      Disp Refills Start End     levothyroxine (SYNTHROID, LEVOTHROID) 100 MCG tablet 90 tablet 1 11/26/2013     Sig - Route: Take 1 tablet (100 mcg total) by mouth daily before breakfast. - Oral    E-Prescribing Status: Receipt confirmed by pharmacy (11/26/2013 4:29 PM EDT)     Associated Diagnoses    Hypothyroidism, unspecified hypothyroidism type - Primary       Pharmacy    [id number] - [place], [place] - [address] AT [place]

## 2014-02-24 ENCOUNTER — Other Ambulatory Visit: Payer: Self-pay | Admitting: Physician Assistant

## 2014-02-24 NOTE — Telephone Encounter (Signed)
Medication Detail      Disp Refills Start End     zolpidem (AMBIEN) 5 MG tablet 30 tablet 2 11/26/2013     Sig: TAKE 1 TABLET BY MOUTH EVERY NIGHT AT BEDTIME AS NEEDED    Class: Print     Associated Diagnoses    Insomnia

## 2014-02-25 NOTE — Telephone Encounter (Signed)
Refill granted.  Rx printed, signed and ready to be faxed.

## 2014-02-25 NOTE — Telephone Encounter (Signed)
Rx request faxed to pharmacy/SLS  

## 2014-03-17 ENCOUNTER — Ambulatory Visit: Payer: 59 | Admitting: Physician Assistant

## 2014-03-17 ENCOUNTER — Telehealth: Payer: Self-pay | Admitting: *Deleted

## 2014-03-17 NOTE — Telephone Encounter (Signed)
Pt did not show for appointment 03/17/2014 at 8:00am for follow up

## 2014-03-31 ENCOUNTER — Other Ambulatory Visit: Payer: Self-pay | Admitting: Physician Assistant

## 2014-03-31 NOTE — Telephone Encounter (Signed)
Medication Detail      Disp Refills Start End     fenofibrate 54 MG tablet 60 tablet 3 12/03/2013     Sig - Route: Take 2 tablets (108 mg total) by mouth daily. - Oral    E-Prescribing Status: Receipt confirmed by pharmacy (12/03/2013 7:46 AM EDT)    Rx request to pharmacy/SLS

## 2014-04-03 ENCOUNTER — Ambulatory Visit: Payer: 59 | Admitting: Physician Assistant

## 2014-04-21 ENCOUNTER — Ambulatory Visit (INDEPENDENT_AMBULATORY_CARE_PROVIDER_SITE_OTHER): Payer: 59 | Admitting: Physician Assistant

## 2014-04-21 ENCOUNTER — Encounter: Payer: Self-pay | Admitting: Physician Assistant

## 2014-04-21 VITALS — BP 143/82 | HR 76 | Temp 98.1°F | Resp 18 | Wt 172.0 lb

## 2014-04-21 DIAGNOSIS — H65192 Other acute nonsuppurative otitis media, left ear: Secondary | ICD-10-CM

## 2014-04-21 DIAGNOSIS — E782 Mixed hyperlipidemia: Secondary | ICD-10-CM

## 2014-04-21 LAB — LIPID PANEL
CHOL/HDL RATIO: 6
Cholesterol: 221 mg/dL — ABNORMAL HIGH (ref 0–200)
HDL: 39.1 mg/dL (ref >39.00)
LDL Cholesterol: 146 mg/dL — ABNORMAL HIGH (ref 0–99)
NonHDL: 181.9
TRIGLYCERIDES: 178 mg/dL — AB (ref 0.0–149.0)
VLDL: 35.6 mg/dL (ref 0.0–40.0)

## 2014-04-21 MED ORDER — AMOXICILLIN 875 MG PO TABS: 875.0000 mg | ORAL_TABLET | Freq: Two times a day (BID) | ORAL | Status: DC

## 2014-04-21 NOTE — Assessment & Plan Note (Signed)
Rx Amoxicillin.  Resume Flonase.  Begin Claritin. Return precautions discussed with patient.

## 2014-04-21 NOTE — Progress Notes (Signed)
Patient presents to clinic today for follow-up of hypertriglyceridemia after initiation of fenofibrate.  Patient endorses taking medication as directed, until last week when she stopped the medication.  States she stopped the medication due to significant muscle aches.  Symptoms stopped with cessation.  Is watching diet some.   Patient also complains of left ear pressure over the past several weeks.  Denies decreased hearing, tinnitus, drainage, pain, URI symptoms or fever.  Past Medical History  Diagnosis Date  . Asthma   . Depression   . Frequent headaches   . GERD (gastroesophageal reflux disease)   . Hay fever   . Environmental allergies   . Elevated blood pressure   . Hyperlipidemia   . Migraines   . Colon polyps   . Thyroid disease   . Chicken pox     Current Outpatient Prescriptions on File Prior to Visit  Medication Sig Dispense Refill  . acyclovir (ZOVIRAX) 400 MG tablet Take 1 tablet (400 mg total) by mouth 3 (three) times daily as needed (for 5 days). 30 tablet 1  . BENICAR 40 MG tablet TAKE 1 TABLET BY MOUTH EVERY NIGHT AT BEDTIME 90 tablet 0  . budesonide-formoterol (SYMBICORT) 160-4.5 MCG/ACT inhaler Inhale 2 puffs into the lungs 2 (two) times daily.    Marland Kitchen levalbuterol (XOPENEX HFA) 45 MCG/ACT inhaler Inhale 2 puffs into the lungs every 4 (four) hours as needed for wheezing (Q4-6 Hours PRN).    Marland Kitchen levothyroxine (SYNTHROID, LEVOTHROID) 100 MCG tablet Take 1 tablet (100 mcg total) by mouth daily before breakfast. 90 tablet 1  . mesalamine (LIALDA) 1.2 G EC tablet Take 2.4 g by mouth daily.    . montelukast (SINGULAIR) 10 MG tablet Take 10 mg by mouth every morning.    . Omega-3 Fatty Acids (FISH OIL) 1200 MG CAPS Take 2 capsules by mouth at bedtime.    . promethazine (PHENERGAN) 25 MG tablet Take 1 tablet (25 mg total) by mouth every 6 (six) hours as needed for nausea or vomiting. 30 tablet 3  . traMADol (ULTRAM) 50 MG tablet Take 1 tablet (50 mg total) by mouth every 8  (eight) hours as needed. 30 tablet 0  . triamcinolone (NASACORT ALLERGY 24HR) 55 MCG/ACT AERO nasal inhaler Place 1 spray into the nose daily.    Marland Kitchen venlafaxine XR (EFFEXOR-XR) 75 MG 24 hr capsule Take 1 capsule (75 mg total) by mouth daily with breakfast. 90 capsule 1  . zolpidem (AMBIEN) 5 MG tablet TAKE 1 TABLET BY MOUTH EVERY NIGHT AT BEDTIME AS NEEDED 30 tablet 3   No current facility-administered medications on file prior to visit.    Allergies  Allergen Reactions  . Ace Inhibitors Other (See Comments)    Angina  . Cefaclor   . Lisinopril Cough  . Losartan Potassium   . Sulfonamide Derivatives     Family History  Problem Relation Age of Onset  . Arthritis Mother 41    Deceased  . Breast cancer Mother   . Lung cancer Mother   . Hyperlipidemia Mother   . Heart disease Mother   . Hypertension Mother   . Diabetes Mother   . Crohn's disease Mother   . Diabetes Maternal Grandfather   . Heart disease Maternal Grandfather   . Heart attack Maternal Grandfather   . Colitis Sister     History   Social History  . Marital Status: Married    Spouse Name: N/A    Number of Children: N/A  . Years of  Education: N/A   Social History Main Topics  . Smoking status: Former Research scientist (life sciences)  . Smokeless tobacco: Never Used     Comment: Quit >20 yrs ago  . Alcohol Use: 1.8 oz/week    3 Glasses of wine per week  . Drug Use: No  . Sexual Activity: Not on file   Other Topics Concern  . Not on file   Social History Narrative   Review of Systems - See HPI.  All other ROS are negative.  BP 143/82 mmHg  Pulse 76  Temp(Src) 98.1 F (36.7 C) (Oral)  Resp 18  Wt 172 lb (78.019 kg)  SpO2 95%  Physical Exam  Constitutional: She is oriented to person, place, and time and well-developed, well-nourished, and in no distress.  HENT:  Head: Normocephalic and atraumatic.  Right Ear: Tympanic membrane, external ear and ear canal normal.  Left Ear: External ear normal. Tympanic membrane is  erythematous and bulging. A middle ear effusion is present.  Nose: Nose normal.  Mouth/Throat: Uvula is midline, oropharynx is clear and moist and mucous membranes are normal.  Eyes: Conjunctivae are normal.  Neck: Neck supple.  Cardiovascular: Normal rate, regular rhythm, normal heart sounds and intact distal pulses.   Pulmonary/Chest: Effort normal and breath sounds normal. No respiratory distress. She has no wheezes. She has no rales. She exhibits no tenderness.  Neurological: She is alert and oriented to person, place, and time.  Skin: Skin is warm and dry. No rash noted.  Psychiatric: Affect normal.  Vitals reviewed.  Assessment/Plan: Elevated triglycerides with high cholesterol Fenofibrate d/c due to side effect.  Will recheck lipids. If TGL still modestly elevated, will start Lovaza or Vascepa.  Acute nonsuppurative otitis media of left ear Rx Amoxicillin.  Resume Flonase.  Begin Claritin. Return precautions discussed with patient.

## 2014-04-21 NOTE — Assessment & Plan Note (Signed)
Fenofibrate d/c due to side effect.  Will recheck lipids. If TGL still modestly elevated, will start Lovaza or Vascepa.

## 2014-04-21 NOTE — Patient Instructions (Signed)
Please stop by the lab for blood work. I will call you with your results.    For the ear pain/infection -- Take the antibiotic as directed with food.  Use your Flonase daily and begin a daily Claritin.  This should help symptoms considerably.  Call or return to clinic if symptoms are not improving.  Otitis Media Otitis media is redness, soreness, and puffiness (swelling) in the space just behind your eardrum (middle ear). It may be caused by allergies or infection. It often happens along with a cold. HOME CARE  Take your medicine as told. Finish it even if you start to feel better.  Only take over-the-counter or prescription medicines for pain, discomfort, or fever as told by your doctor.  Follow up with your doctor as told. GET HELP IF:  You have otitis media only in one ear, or bleeding from your nose, or both.  You notice a lump on your neck.  You are not getting better in 3-5 days.  You feel worse instead of better. GET HELP RIGHT AWAY IF:   You have pain that is not helped with medicine.  You have puffiness, redness, or pain around your ear.  You get a stiff neck.  You cannot move part of your face (paralysis).  You notice that the bone behind your ear hurts when you touch it. MAKE SURE YOU:   Understand these instructions.  Will watch your condition.  Will get help right away if you are not doing well or get worse. Document Released: 09/20/2007 Document Revised: 04/08/2013 Document Reviewed: 10/29/2012 [facility] Patient Information 2015 [place], [place]. This information is not intended to replace advice given to you by your health care provider. Make sure you discuss any questions you have with your health care provider.

## 2014-04-21 NOTE — Addendum Note (Signed)
Addended by: Rockwell Germany on: 04/21/2014 09:06 AM   Modules accepted: Orders

## 2014-04-21 NOTE — Progress Notes (Signed)
Pre visit review using our clinic review tool, if applicable. No additional management support is needed unless otherwise documented below in the visit note. 

## 2014-04-22 ENCOUNTER — Other Ambulatory Visit: Payer: Self-pay | Admitting: Physician Assistant

## 2014-05-01 ENCOUNTER — Telehealth: Payer: Self-pay | Admitting: Physician Assistant

## 2014-05-01 DIAGNOSIS — E781 Pure hyperglyceridemia: Secondary | ICD-10-CM

## 2014-05-01 MED ORDER — LEVOFLOXACIN 500 MG PO TABS: 500.0000 mg | ORAL_TABLET | Freq: Every day | ORAL | Status: DC

## 2014-05-01 MED ORDER — ICOSAPENT ETHYL 1 G PO CAPS: 1.0000 | ORAL_CAPSULE | Freq: Two times a day (BID) | ORAL | Status: DC

## 2014-05-01 NOTE — Telephone Encounter (Signed)
Patient informed, understood & agreed/SLS  

## 2014-05-01 NOTE — Telephone Encounter (Signed)
-----   Message from Rockwell Germany, [place] sent at 05/01/2014  7:29 AM EST ----- Regarding: ABX Patient informed, understood & agreed to proceed with Vascepa Rx to pharmacy/SLS Pt also states she is still having lots of congestion and pressure in sinus & ears; requesting new ABX; Please Advise/SLS  ----- Message -----    From: Brunetta Jeans, PA-C    Sent: 04/22/2014   7:10 AM      To: Rockwell Germany, CMA  TGL have decreased from 286 to 178 which is good but still elevated.  Her LDL cholesterol has increased which can happen from OTC fish oil use.  I would like to prescribe her Vascepa which will take place of her OTC fish oil supplement.  This will more aggressively keep TGL in check but has been shown to not raise LDL cholesterol.  Please let me know if the patient is willing and I will send in Rx.

## 2014-05-01 NOTE — Telephone Encounter (Signed)
Rx Vascepa sent to pharmacy.  Rx Levaquin sent to pharmacy for continued sinusitis.  If no improvement will need repeat OV.

## 2014-05-05 ENCOUNTER — Telehealth: Payer: Self-pay | Admitting: *Deleted

## 2014-05-05 NOTE — Telephone Encounter (Signed)
Prior authorization initiated for Vascepa. Awaiting determination from [facility]. JG//CMA

## 2014-05-06 ENCOUNTER — Telehealth: Payer: Self-pay | Admitting: Physician Assistant

## 2014-05-06 DIAGNOSIS — E781 Pure hyperglyceridemia: Secondary | ICD-10-CM

## 2014-05-06 MED ORDER — ICOSAPENT ETHYL 1 G PO CAPS: 1.0000 | ORAL_CAPSULE | Freq: Two times a day (BID) | ORAL | Status: DC

## 2014-05-06 NOTE — Telephone Encounter (Signed)
Approved.  

## 2014-05-06 NOTE — Telephone Encounter (Signed)
Vascepa re-sent to Pharmacy.

## 2014-05-17 ENCOUNTER — Other Ambulatory Visit: Payer: Self-pay | Admitting: Physician Assistant

## 2014-05-18 NOTE — Telephone Encounter (Signed)
Rx request to pharmacy/SLS  

## 2014-06-25 ENCOUNTER — Other Ambulatory Visit: Payer: Self-pay | Admitting: Physician Assistant

## 2014-06-25 NOTE — Telephone Encounter (Signed)
Rx printed and placed in [place], PA-C red folder for review and signature.

## 2014-06-25 NOTE — Telephone Encounter (Signed)
Ok to fax in refill.

## 2014-06-25 NOTE — Telephone Encounter (Signed)
Last filled:  02/25/14 Amt: 30, 3 Last OV:  04/21/14  Please advise.

## 2014-06-26 NOTE — Telephone Encounter (Signed)
Rx request faxed to pharmacy/SLS  

## 2014-07-24 ENCOUNTER — Ambulatory Visit (INDEPENDENT_AMBULATORY_CARE_PROVIDER_SITE_OTHER): Payer: 59 | Admitting: Physician Assistant

## 2014-07-24 ENCOUNTER — Encounter: Payer: Self-pay | Admitting: Physician Assistant

## 2014-07-24 VITALS — BP 137/70 | HR 94 | Temp 98.5°F | Resp 16 | Ht 63.0 in | Wt 173.2 lb

## 2014-07-24 DIAGNOSIS — J019 Acute sinusitis, unspecified: Secondary | ICD-10-CM

## 2014-07-24 DIAGNOSIS — B9689 Other specified bacterial agents as the cause of diseases classified elsewhere: Secondary | ICD-10-CM | POA: Insufficient documentation

## 2014-07-24 MED ORDER — LEVOFLOXACIN 500 MG PO TABS: 500.0000 mg | ORAL_TABLET | Freq: Every day | ORAL | Status: DC

## 2014-07-24 NOTE — Assessment & Plan Note (Signed)
Rx Levaquin.  Increase fluids.  Rest.  Saline nasal spray.  Probiotic.  Mucinex as directed.  Humidifier in bedroom. Flonase daily.  Call or return to clinic if symptoms are not improving.

## 2014-07-24 NOTE — Progress Notes (Signed)
Pre visit review using our clinic review tool, if applicable. No additional management support is needed unless otherwise documented below in the visit note/SLS  

## 2014-07-24 NOTE — Patient Instructions (Signed)
Please take antibiotic as directed.  Increase fluid intake.  Use Saline nasal spray.  Take a daily multivitamin. Use Plain Mucinex.  Also consider daily Flonase.  Place a humidifier in the bedroom.  Please call or return clinic if symptoms are not improving.  Sinusitis Sinusitis is redness, soreness, and swelling (inflammation) of the paranasal sinuses. Paranasal sinuses are air pockets within the bones of your face (beneath the eyes, the middle of the forehead, or above the eyes). In healthy paranasal sinuses, mucus is able to drain out, and air is able to circulate through them by way of your nose. However, when your paranasal sinuses are inflamed, mucus and air can become trapped. This can allow bacteria and other germs to grow and cause infection. Sinusitis can develop quickly and last only a short time (acute) or continue over a long period (chronic). Sinusitis that lasts for more than 12 weeks is considered chronic.  CAUSES  Causes of sinusitis include:  Allergies.  Structural abnormalities, such as displacement of the cartilage that separates your nostrils (deviated septum), which can decrease the air flow through your nose and sinuses and affect sinus drainage.  Functional abnormalities, such as when the small hairs (cilia) that line your sinuses and help remove mucus do not work properly or are not present. SYMPTOMS  Symptoms of acute and chronic sinusitis are the same. The primary symptoms are pain and pressure around the affected sinuses. Other symptoms include:  Upper toothache.  Earache.  Headache.  Bad breath.  Decreased sense of smell and taste.  A cough, which worsens when you are lying flat.  Fatigue.  Fever.  Thick drainage from your nose, which often is green and may contain pus (purulent).  Swelling and warmth over the affected sinuses. DIAGNOSIS  Your caregiver will perform a physical exam. During the exam, your caregiver may:  Look in your nose for signs of  abnormal growths in your nostrils (nasal polyps).  Tap over the affected sinus to check for signs of infection.  View the inside of your sinuses (endoscopy) with a special imaging device with a light attached (endoscope), which is inserted into your sinuses. If your caregiver suspects that you have chronic sinusitis, one or more of the following tests may be recommended:  Allergy tests.  Nasal culture A sample of mucus is taken from your nose and sent to a lab and screened for bacteria.  Nasal cytology A sample of mucus is taken from your nose and examined by your caregiver to determine if your sinusitis is related to an allergy. TREATMENT  Most cases of acute sinusitis are related to a viral infection and will resolve on their own within 10 days. Sometimes medicines are prescribed to help relieve symptoms (pain medicine, decongestants, nasal steroid sprays, or saline sprays).  However, for sinusitis related to a bacterial infection, your caregiver will prescribe antibiotic medicines. These are medicines that will help kill the bacteria causing the infection.  Rarely, sinusitis is caused by a fungal infection. In theses cases, your caregiver will prescribe antifungal medicine. For some cases of chronic sinusitis, surgery is needed. Generally, these are cases in which sinusitis recurs more than 3 times per year, despite other treatments. HOME CARE INSTRUCTIONS   Drink plenty of water. Water helps thin the mucus so your sinuses can drain more easily.  Use a humidifier.  Inhale steam 3 to 4 times a day (for example, sit in the bathroom with the shower running).  Apply a warm, moist washcloth to  your face 3 to 4 times a day, or as directed by your caregiver.  Use saline nasal sprays to help moisten and clean your sinuses.  Take over-the-counter or prescription medicines for pain, discomfort, or fever only as directed by your caregiver. SEEK IMMEDIATE MEDICAL CARE IF:  You have increasing  pain or severe headaches.  You have nausea, vomiting, or drowsiness.  You have swelling around your face.  You have vision problems.  You have a stiff neck.  You have difficulty breathing. MAKE SURE YOU:   Understand these instructions.  Will watch your condition.  Will get help right away if you are not doing well or get worse. Document Released: 04/03/2005 Document Revised: 06/26/2011 Document Reviewed: 04/18/2011 [facility] Patient Information 2014 [place], [place].

## 2014-07-24 NOTE — Progress Notes (Signed)
History of Present Illness: Erin Fields is a 49 y.o. female who present to the clinic today complaining of sinus pressure, sinus pain and nasal congestion x 6 days. Patient endorses symptoms have been progressively worsening.  Endorses significant L-sided sinus tenderness and facial pain radiating into teeth.  Patient denies chest pain or shortness of breath.  Does have significant hx of bacterial sinusitis   History: Past Medical History  Diagnosis Date  . Asthma   . Depression   . Frequent headaches   . GERD (gastroesophageal reflux disease)   . Hay fever   . Environmental allergies   . Elevated blood pressure   . Hyperlipidemia   . Migraines   . Colon polyps   . Thyroid disease   . Chicken pox     Current outpatient prescriptions:  .  acyclovir (ZOVIRAX) 400 MG tablet, Take 1 tablet (400 mg total) by mouth 3 (three) times daily as needed (for 5 days)., Disp: 30 tablet, Rfl: 1 .  BENICAR 40 MG tablet, TAKE 1 TABLET BY MOUTH EVERY NIGHT AT BEDTIME, Disp: 90 tablet, Rfl: 0 .  budesonide-formoterol (SYMBICORT) 160-4.5 MCG/ACT inhaler, Inhale 2 puffs into the lungs 2 (two) times daily., Disp: , Rfl:  .  Icosapent Ethyl (VASCEPA) 1 G CAPS, Take 1 tablet by mouth 2 (two) times daily., Disp: 180 capsule, Rfl: 1 .  levalbuterol (XOPENEX HFA) 45 MCG/ACT inhaler, Inhale 2 puffs into the lungs every 4 (four) hours as needed for wheezing (Q4-6 Hours PRN)., Disp: , Rfl:  .  levothyroxine (SYNTHROID, LEVOTHROID) 100 MCG tablet, Take 1 tablet (100 mcg total) by mouth daily before breakfast., Disp: 90 tablet, Rfl: 1 .  mesalamine (LIALDA) 1.2 G EC tablet, Take 2.4 g by mouth daily., Disp: , Rfl:  .  montelukast (SINGULAIR) 10 MG tablet, Take 10 mg by mouth every morning., Disp: , Rfl:  .  Omega-3 Fatty Acids (FISH OIL) 1200 MG CAPS, Take 2 capsules by mouth at bedtime., Disp: , Rfl:  .  promethazine (PHENERGAN) 25 MG tablet, Take 1 tablet (25 mg total) by mouth every 6 (six) hours as needed for  nausea or vomiting., Disp: 30 tablet, Rfl: 3 .  traMADol (ULTRAM) 50 MG tablet, Take 1 tablet (50 mg total) by mouth every 8 (eight) hours as needed., Disp: 30 tablet, Rfl: 0 .  triamcinolone (NASACORT ALLERGY 24HR) 55 MCG/ACT AERO nasal inhaler, Place 1 spray into the nose daily., Disp: , Rfl:  .  venlafaxine XR (EFFEXOR-XR) 75 MG 24 hr capsule, TAKE 1 CAPSULE BY MOUTH EVERY DAY WITH BREAKFAST, Disp: 90 capsule, Rfl: 1 .  zolpidem (AMBIEN) 5 MG tablet, TAKE 1 TABLET BY MOUTH EVERY NIGHT AT BEDTIME AS NEEDED, Disp: 30 tablet, Rfl: 0 .  levofloxacin (LEVAQUIN) 500 MG tablet, Take 1 tablet (500 mg total) by mouth daily., Disp: 7 tablet, Rfl: 0 Allergies  Allergen Reactions  . Ace Inhibitors Other (See Comments)    Angina  . Cefaclor   . Lisinopril Cough  . Losartan Potassium   . Sulfonamide Derivatives    Family History  Problem Relation Age of Onset  . Arthritis Mother 56    Deceased  . Breast cancer Mother   . Lung cancer Mother   . Hyperlipidemia Mother   . Heart disease Mother   . Hypertension Mother   . Diabetes Mother   . Crohn's disease Mother   . Diabetes Maternal Grandfather   . Heart disease Maternal Grandfather   . Heart attack Maternal  Grandfather   . Colitis Sister    History   Social History  . Marital Status: Married    Spouse Name: N/A  . Number of Children: N/A  . Years of Education: N/A   Social History Main Topics  . Smoking status: Former Research scientist (life sciences)  . Smokeless tobacco: Never Used     Comment: Quit >20 yrs ago  . Alcohol Use: 1.8 oz/week    3 Glasses of wine per week  . Drug Use: No  . Sexual Activity: Not on file   Other Topics Concern  . None   Social History Narrative    Review of Systems: See HPI.  All other ROS are negative.  Physical Examination: BP 137/70 mmHg  Pulse 94  Temp(Src) 98.5 F (36.9 C) (Oral)  Resp 16  Ht 5' 3"  (1.6 m)  Wt 173 lb 4 oz (78.586 kg)  BMI 30.70 kg/m2  SpO2 96%  General appearance: alert, cooperative and  appears stated age Head: Normocephalic, without obvious abnormality, atraumatic, sinuses tender to percussion Eyes: conjunctivae/corneas clear. PERRL, EOM's intact. Fundi benign. Ears: normal TM's and external ear canals both ears Nose: moderate congestion, turbinates swollen, sinus tenderness left Throat: lips, mucosa, and tongue normal; teeth and gums normal Neck: no adenopathy, no carotid bruit, no JVD, supple, symmetrical, trachea midline and thyroid not enlarged, symmetric, no tenderness/mass/nodules Lungs: clear to auscultation bilaterally Chest wall: no tenderness  Assessment/Plan: Acute bacterial sinusitis Rx Levaquin.  Increase fluids.  Rest.  Saline nasal spray.  Probiotic.  Mucinex as directed.  Humidifier in bedroom. Flonase daily.  Call or return to clinic if symptoms are not improving.

## 2014-07-27 ENCOUNTER — Other Ambulatory Visit: Payer: Self-pay | Admitting: Physician Assistant

## 2014-07-27 NOTE — Telephone Encounter (Signed)
Medication Detail      Disp Refills Start End     acyclovir (ZOVIRAX) 400 MG tablet 30 tablet 1 08/20/2013     Sig - Route: Take 1 tablet (400 mg total) by mouth 3 (three) times daily as needed (for 5 days). - Oral    E-Prescribing Status: Receipt confirmed by pharmacy (08/20/2013 7:28 AM EDT)    Rx request to pharmacy/SLS

## 2014-07-30 ENCOUNTER — Other Ambulatory Visit: Payer: Self-pay | Admitting: Physician Assistant

## 2014-08-05 ENCOUNTER — Other Ambulatory Visit: Payer: Self-pay | Admitting: Physician Assistant

## 2014-08-05 MED ORDER — OSELTAMIVIR PHOSPHATE 75 MG PO CAPS: 75.0000 mg | ORAL_CAPSULE | Freq: Every day | ORAL | Status: DC

## 2014-08-10 ENCOUNTER — Other Ambulatory Visit: Payer: Self-pay | Admitting: Physician Assistant

## 2014-08-13 ENCOUNTER — Ambulatory Visit
Admit: 2014-08-13 | Disposition: A | Discharge status: Home / Self Care | Payer: Self-pay | Attending: Allergy | Admitting: Allergy

## 2014-08-21 ENCOUNTER — Encounter: Payer: Self-pay | Admitting: *Deleted

## 2014-08-21 ENCOUNTER — Telehealth: Payer: Self-pay | Admitting: *Deleted

## 2014-08-21 NOTE — Telephone Encounter (Signed)
Pre-Visit Call completed with patient and chart updated.   Pre-Visit Info documented in Specialty Comments under [employer].    

## 2014-08-24 ENCOUNTER — Ambulatory Visit (HOSPITAL_BASED_OUTPATIENT_CLINIC_OR_DEPARTMENT_OTHER)
Admission: RE | Admit: 2014-08-24 | Discharge: 2014-08-24 | Disposition: A | Discharge status: Home / Self Care | Payer: 59 | Source: Ambulatory Visit | Attending: Physician Assistant | Admitting: Physician Assistant

## 2014-08-24 ENCOUNTER — Encounter: Payer: Self-pay | Admitting: Physician Assistant

## 2014-08-24 ENCOUNTER — Ambulatory Visit (INDEPENDENT_AMBULATORY_CARE_PROVIDER_SITE_OTHER): Payer: 59 | Admitting: Physician Assistant

## 2014-08-24 VITALS — BP 131/72 | HR 86 | Temp 98.0°F | Ht 63.0 in | Wt 169.0 lb

## 2014-08-24 DIAGNOSIS — R932 Abnormal findings on diagnostic imaging of liver and biliary tract: Secondary | ICD-10-CM | POA: Diagnosis not present

## 2014-08-24 DIAGNOSIS — I10 Essential (primary) hypertension: Secondary | ICD-10-CM | POA: Diagnosis not present

## 2014-08-24 DIAGNOSIS — Z0000 Encounter for general adult medical examination without abnormal findings: Secondary | ICD-10-CM

## 2014-08-24 DIAGNOSIS — J4540 Moderate persistent asthma, uncomplicated: Secondary | ICD-10-CM

## 2014-08-24 DIAGNOSIS — E785 Hyperlipidemia, unspecified: Secondary | ICD-10-CM

## 2014-08-24 DIAGNOSIS — D1800 Hemangioma unspecified site: Secondary | ICD-10-CM | POA: Diagnosis present

## 2014-08-24 DIAGNOSIS — D1803 Hemangioma of intra-abdominal structures: Secondary | ICD-10-CM | POA: Diagnosis not present

## 2014-08-24 HISTORY — DX: Hemangioma of intra-abdominal structures: D18.03

## 2014-08-24 LAB — HEPATIC FUNCTION PANEL
ALK PHOS: 56 U/L (ref 39–117)
ALT: 17 U/L (ref 0–35)
AST: 11 U/L (ref 0–37)
Albumin: 3.8 g/dL (ref 3.5–5.2)
BILIRUBIN DIRECT: 0.1 mg/dL (ref 0.0–0.3)
Total Bilirubin: 0.8 mg/dL (ref 0.2–1.2)
Total Protein: 6.2 g/dL (ref 6.0–8.3)

## 2014-08-24 LAB — CBC
HCT: 39.3 % (ref 36.0–46.0)
HEMOGLOBIN: 13.3 g/dL (ref 12.0–15.0)
MCHC: 33.7 g/dL (ref 30.0–36.0)
MCV: 85.2 fl (ref 78.0–100.0)
PLATELETS: 301 10*3/uL (ref 150.0–400.0)
RBC: 4.61 Mil/uL (ref 3.87–5.11)
RDW: 13.8 % (ref 11.5–15.5)
WBC: 8.3 10*3/uL (ref 4.0–10.5)

## 2014-08-24 LAB — URINALYSIS, ROUTINE W REFLEX MICROSCOPIC
Bilirubin Urine: NEGATIVE
Hgb urine dipstick: NEGATIVE
Ketones, ur: NEGATIVE
LEUKOCYTES UA: NEGATIVE
NITRITE: NEGATIVE
PH: 7 (ref 5.0–8.0)
RBC / HPF: NONE SEEN (ref 0–?)
SPECIFIC GRAVITY, URINE: 1.02 (ref 1.000–1.030)
Total Protein, Urine: NEGATIVE
UROBILINOGEN UA: 0.2 (ref 0.0–1.0)
Urine Glucose: NEGATIVE
WBC, UA: NONE SEEN (ref 0–?)

## 2014-08-24 LAB — LIPID PANEL
CHOL/HDL RATIO: 5
Cholesterol: 240 mg/dL — ABNORMAL HIGH (ref 0–200)
HDL: 49.3 mg/dL (ref >39.00)
NONHDL: 190.7
TRIGLYCERIDES: 222 mg/dL — AB (ref 0.0–149.0)
VLDL: 44.4 mg/dL — ABNORMAL HIGH (ref 0.0–40.0)

## 2014-08-24 LAB — BASIC METABOLIC PANEL
BUN: 11 mg/dL (ref 6–23)
CALCIUM: 9.3 mg/dL (ref 8.4–10.5)
CO2: 27 mEq/L (ref 19–32)
Chloride: 105 mEq/L (ref 96–112)
Creatinine, Ser: 0.58 mg/dL (ref 0.40–1.20)
GFR: 116.33 mL/min (ref >60.00)
Glucose, Bld: 122 mg/dL — ABNORMAL HIGH (ref 70–99)
Potassium: 4 mEq/L (ref 3.5–5.1)
SODIUM: 138 meq/L (ref 135–145)

## 2014-08-24 LAB — TSH: TSH: 0.18 u[IU]/mL — ABNORMAL LOW (ref 0.35–4.50)

## 2014-08-24 LAB — HEMOGLOBIN A1C: HEMOGLOBIN A1C: 6.4 % (ref 4.6–6.5)

## 2014-08-24 LAB — LDL CHOLESTEROL, DIRECT: LDL DIRECT: 162 mg/dL

## 2014-08-24 MED ORDER — HYDROCOD POLST-CPM POLST ER 10-8 MG/5ML PO SUER: 5.0000 mL | Freq: Two times a day (BID) | ORAL | Status: DC | PRN

## 2014-08-24 NOTE — Assessment & Plan Note (Signed)
Referral to pulmonology placed given continued cough despite antibiotics and steroids with negative CXR.  Rx tussionex for cough. Continue chronic asthma medications.

## 2014-08-24 NOTE — Progress Notes (Signed)
Patient presents to clinic today for annual exam.  Patient is fasting for labs.  Acute Concerns: Patient still with productive cough despite 2 rounds of antibiotics -- doxycycline and Avelox.  Patient just had CXR performed by her Allergist which was negative.  States symptoms improved with steroid taper given by Allergist but cough is now productive again.  Denies fever, chills, malaise, fatigue or chest pain.  Chronic Issues: Liver Hemangioma -- Noted on [place] last year.  Asymptomatic.  Patient due for repeat US to assess stability.  Health Maintenance: Dental -- up-to-date Vision -- up-to-date Immunizations -- up-to-date Colonoscopy -- up-to-date.  Followed by GI. Mammogram -- up-to-date.  Followed by OB/GYN.  Past Medical History  Diagnosis Date  . Asthma   . Depression   . Frequent headaches   . GERD (gastroesophageal reflux disease)   . Hay fever   . Environmental allergies   . Elevated blood pressure   . Hyperlipidemia   . Migraines   . Colon polyps   . Thyroid disease   . Chicken pox     Past Surgical History  Procedure Laterality Date  . Foot surgery  2012  . Ablation  2011  . Tonsillectomy  1980  . Wisdom tooth extraction    . Vein surgery      Removal Right Leg  . Hand surgery      Current Outpatient Prescriptions on File Prior to Visit  Medication Sig Dispense Refill  . acyclovir (ZOVIRAX) 400 MG tablet TAKE 1 TABLET BY MOUTH THREE TIMES DAILY AS NEEDED 30 tablet 1  . BENICAR 40 MG tablet TAKE 1 TABLET BY MOUTH EVERY NIGHT AT BEDTIME 90 tablet 0  . budesonide-formoterol (SYMBICORT) 160-4.5 MCG/ACT inhaler Inhale 2 puffs into the lungs 2 (two) times daily.    Marland Kitchen levalbuterol (XOPENEX HFA) 45 MCG/ACT inhaler Inhale 2 puffs into the lungs every 4 (four) hours as needed for wheezing (Q4-6 Hours PRN).    Marland Kitchen levothyroxine (SYNTHROID, LEVOTHROID) 100 MCG tablet TAKE 1 TABLET BY MOUTH DAILY BEFORE BREAKFAST 90 tablet 0  . mesalamine (LIALDA) 1.2 G EC tablet Take  2.4 g by mouth daily.    . montelukast (SINGULAIR) 10 MG tablet Take 10 mg by mouth every morning.    . Omega-3 Fatty Acids (FISH OIL) 1200 MG CAPS Take 2 capsules by mouth at bedtime.    . promethazine (PHENERGAN) 25 MG tablet Take 1 tablet (25 mg total) by mouth every 6 (six) hours as needed for nausea or vomiting. 30 tablet 3  . triamcinolone (NASACORT ALLERGY 24HR) 55 MCG/ACT AERO nasal inhaler Place 1 spray into the nose daily.    Marland Kitchen venlafaxine XR (EFFEXOR-XR) 75 MG 24 hr capsule TAKE 1 CAPSULE BY MOUTH EVERY DAY WITH BREAKFAST 90 capsule 1  . zolpidem (AMBIEN) 5 MG tablet TAKE 1 TABLET BY MOUTH EVERY DAY AT BEDTIME AS NEEDED 30 tablet 1   No current facility-administered medications on file prior to visit.    Allergies  Allergen Reactions  . Ace Inhibitors Other (See Comments)    Angina  . Cefaclor   . Lisinopril Cough  . Losartan Potassium   . Sulfonamide Derivatives     Family History  Problem Relation Age of Onset  . Arthritis Mother 45    Deceased  . Breast cancer Mother   . Lung cancer Mother   . Hyperlipidemia Mother   . Heart disease Mother   . Hypertension Mother   . Diabetes Mother   . Crohn's  disease Mother   . Diabetes Maternal Grandfather   . Heart disease Maternal Grandfather   . Heart attack Maternal Grandfather   . Colitis Sister     History   Social History  . Marital Status: Married    Spouse Name: N/A  . Number of Children: N/A  . Years of Education: N/A   Occupational History  . Not on file.   Social History Main Topics  . Smoking status: Former Research scientist (life sciences)  . Smokeless tobacco: Never Used     Comment: Quit >20 yrs ago  . Alcohol Use: 1.8 oz/week    3 Glasses of wine per week  . Drug Use: No  . Sexual Activity: Not on file   Other Topics Concern  . Not on file   Social History Narrative   Review of Systems  Constitutional: Negative for fever and weight loss.  HENT: Negative for ear discharge, ear pain, hearing loss and tinnitus.     Eyes: Negative for blurred vision, double vision, photophobia and pain.  Respiratory: Positive for cough. Negative for shortness of breath.   Cardiovascular: Negative for chest pain and palpitations.  Gastrointestinal: Negative for heartburn, nausea, vomiting, abdominal pain, diarrhea, constipation, blood in stool and melena.  Genitourinary: Negative for dysuria, urgency, frequency, hematuria and flank pain.  Musculoskeletal: Negative for falls.  Neurological: Negative for dizziness, loss of consciousness and headaches.  Endo/Heme/Allergies: Negative for environmental allergies.  Psychiatric/Behavioral: Negative for depression, suicidal ideas, hallucinations and substance abuse. The patient is not nervous/anxious and does not have insomnia.    BP 131/72 mmHg  Pulse 86  Temp(Src) 98 F (36.7 C)  Ht 5' 3"  (1.6 m)  Wt 169 lb (76.658 kg)  BMI 29.94 kg/m2  SpO2 95%  Physical Exam  Constitutional: She is oriented to person, place, and time and well-developed, well-nourished, and in no distress.  HENT:  Head: Normocephalic and atraumatic.  Right Ear: External ear normal.  Left Ear: External ear normal.  Nose: Nose normal.  Mouth/Throat: Oropharynx is clear and moist. No oropharyngeal exudate.  TM within normal limits bilaterally.  Eyes: Conjunctivae are normal. Pupils are equal, round, and reactive to light.  Neck: Neck supple.  Cardiovascular: Normal rate, regular rhythm, normal heart sounds and intact distal pulses.   Pulmonary/Chest: Effort normal and breath sounds normal. No respiratory distress. She has no wheezes. She has no rales. She exhibits no tenderness.  Abdominal: Soft. Bowel sounds are normal. She exhibits no distension and no mass. There is no tenderness. There is no rebound and no guarding.  Musculoskeletal: Normal range of motion.  Lymphadenopathy:    She has no cervical adenopathy.  Neurological: She is alert and oriented to person, place, and time.  Skin: Skin is  warm and dry. No rash noted.  Psychiatric: Affect normal.  Vitals reviewed.  Assessment/Plan: Asthma Referral to pulmonology placed given continued cough despite antibiotics and steroids with negative CXR.  Rx tussionex for cough. Continue chronic asthma medications.   Hyperlipidemia Will check fasting lipid panel today.    Hypertension Stable. No changes today.   Visit for preventive health examination Depression screen negative.  Immunizations, Mammogram and Colonoscopy up-to-date. Followed by GYN for pap.  Will obtain fasting labs today to include CBC, CMP, LFT, A1C, UA. Preventive care discussed.  Handout given in AVS.   Liver hemangioma Will obtain repeat US to reassess mass per Radiology recommendations.

## 2014-08-24 NOTE — Assessment & Plan Note (Signed)
Will check fasting lipid panel today.

## 2014-08-24 NOTE — Progress Notes (Signed)
Pre visit review using our clinic review tool, if applicable. No additional management support is needed unless otherwise documented below in the visit note. 

## 2014-08-24 NOTE — Patient Instructions (Signed)
Please go to the lab for blood work. I will call you with your results.  Take tussionex as directed for cough. Continue chronic asthma medications. You will be contacted for an appointment with Pulmonology for further assessment of chronic symptoms.  You will also be contacted by Imaging to schedule your ultrasound.  Preventive Care for Adults A healthy lifestyle and preventive care can promote health and wellness. Preventive health guidelines for women include the following key practices.  A routine yearly physical is a good way to check with your health care provider about your health and preventive screening. It is a chance to share any concerns and updates on your health and to receive a thorough exam.  Visit your dentist for a routine exam and preventive care every 6 months. Brush your teeth twice a day and floss once a day. Good oral hygiene prevents tooth decay and gum disease.  The frequency of eye exams is based on your age, health, family medical history, use of contact lenses, and other factors. Follow your health care provider's recommendations for frequency of eye exams.  Eat a healthy diet. Foods like vegetables, fruits, whole grains, low-fat dairy products, and lean protein foods contain the nutrients you need without too many calories. Decrease your intake of foods high in solid fats, added sugars, and salt. Eat the right amount of calories for you.Get information about a proper diet from your health care provider, if necessary.  Regular physical exercise is one of the most important things you can do for your health. Most adults should get at least 150 minutes of moderate-intensity exercise (any activity that increases your heart rate and causes you to sweat) each week. In addition, most adults need muscle-strengthening exercises on 2 or more days a week.  Maintain a healthy weight. The body mass index (BMI) is a screening tool to identify possible weight problems. It provides  an estimate of body fat based on height and weight. Your health care provider can find your BMI and can help you achieve or maintain a healthy weight.For adults 20 years and older:  A BMI below 18.5 is considered underweight.  A BMI of 18.5 to 24.9 is normal.  A BMI of 25 to 29.9 is considered overweight.  A BMI of 30 and above is considered obese.  Maintain normal blood lipids and cholesterol levels by exercising and minimizing your intake of saturated fat. Eat a balanced diet with plenty of fruit and vegetables. Blood tests for lipids and cholesterol should begin at age 70 and be repeated every 5 years. If your lipid or cholesterol levels are high, you are over 50, or you are at high risk for heart disease, you may need your cholesterol levels checked more frequently.Ongoing high lipid and cholesterol levels should be treated with medicines if diet and exercise are not working.  If you smoke, find out from your health care provider how to quit. If you do not use tobacco, do not start.  Lung cancer screening is recommended for adults aged 4-80 years who are at high risk for developing lung cancer because of a history of smoking. A yearly low-dose CT scan of the lungs is recommended for people who have at least a 30-pack-year history of smoking and are a current smoker or have quit within the past 15 years. A pack year of smoking is smoking an average of 1 pack of cigarettes a day for 1 year (for example: 1 pack a day for 30 years or 2 packs  a day for 15 years). Yearly screening should continue until the smoker has stopped smoking for at least 15 years. Yearly screening should be stopped for people who develop a health problem that would prevent them from having lung cancer treatment.  If you are pregnant, do not drink alcohol. If you are breastfeeding, be very cautious about drinking alcohol. If you are not pregnant and choose to drink alcohol, do not have more than 1 drink per day. One drink is  considered to be 12 ounces (355 mL) of beer, 5 ounces (148 mL) of wine, or 1.5 ounces (44 mL) of liquor.  Avoid use of street drugs. Do not share needles with anyone. Ask for help if you need support or instructions about stopping the use of drugs.  High blood pressure causes heart disease and increases the risk of stroke. Your blood pressure should be checked at least every 1 to 2 years. Ongoing high blood pressure should be treated with medicines if weight loss and exercise do not work.  If you are 50-70 years old, ask your health care provider if you should take aspirin to prevent strokes.  Diabetes screening involves taking a blood sample to check your fasting blood sugar level. This should be done once every 3 years, after age 34, if you are within normal weight and without risk factors for diabetes. Testing should be considered at a younger age or be carried out more frequently if you are overweight and have at least 1 risk factor for diabetes.  Breast cancer screening is essential preventive care for women. You should practice "breast self-awareness." This means understanding the normal appearance and feel of your breasts and may include breast self-examination. Any changes detected, no matter how small, should be reported to a health care provider. Women in their 74s and 30s should have a clinical breast exam (CBE) by a health care provider as part of a regular health exam every 1 to 3 years. After age 32, women should have a CBE every year. Starting at age 40, women should consider having a mammogram (breast X-ray test) every year. Women who have a family history of breast cancer should talk to their health care provider about genetic screening. Women at a high risk of breast cancer should talk to their health care providers about having an MRI and a mammogram every year.  Breast cancer gene (BRCA)-related cancer risk assessment is recommended for women who have family members with BRCA-related  cancers. BRCA-related cancers include breast, ovarian, tubal, and peritoneal cancers. Having family members with these cancers may be associated with an increased risk for harmful changes (mutations) in the breast cancer genes BRCA1 and BRCA2. Results of the assessment will determine the need for genetic counseling and BRCA1 and BRCA2 testing.  Routine pelvic exams to screen for cancer are no longer recommended for nonpregnant women who are considered low risk for cancer of the pelvic organs (ovaries, uterus, and vagina) and who do not have symptoms. Ask your health care provider if a screening pelvic exam is right for you.  If you have had past treatment for cervical cancer or a condition that could lead to cancer, you need Pap tests and screening for cancer for at least 20 years after your treatment. If Pap tests have been discontinued, your risk factors (such as having a new sexual partner) need to be reassessed to determine if screening should be resumed. Some women have medical problems that increase the chance of getting cervical cancer. In these  cases, your health care provider may recommend more frequent screening and Pap tests.  The HPV test is an additional test that may be used for cervical cancer screening. The HPV test looks for the virus that can cause the cell changes on the cervix. The cells collected during the Pap test can be tested for HPV. The HPV test could be used to screen women aged 41 years and older, and should be used in women of any age who have unclear Pap test results. After the age of 9, women should have HPV testing at the same frequency as a Pap test.  Colorectal cancer can be detected and often prevented. Most routine colorectal cancer screening begins at the age of 73 years and continues through age 45 years. However, your health care provider may recommend screening at an earlier age if you have risk factors for colon cancer. On a yearly basis, your health care provider  may provide home test kits to check for hidden blood in the stool. Use of a small camera at the end of a tube, to directly examine the colon (sigmoidoscopy or colonoscopy), can detect the earliest forms of colorectal cancer. Talk to your health care provider about this at age 46, when routine screening begins. Direct exam of the colon should be repeated every 5-10 years through age 61 years, unless early forms of pre-cancerous polyps or small growths are found.  People who are at an increased risk for hepatitis B should be screened for this virus. You are considered at high risk for hepatitis B if:  You were born in a country where hepatitis B occurs often. Talk with your health care provider about which countries are considered high risk.  Your parents were born in a high-risk country and you have not received a shot to protect against hepatitis B (hepatitis B vaccine).  You have HIV or AIDS.  You use needles to inject street drugs.  You live with, or have sex with, someone who has hepatitis B.  You get hemodialysis treatment.  You take certain medicines for conditions like cancer, organ transplantation, and autoimmune conditions.  Hepatitis C blood testing is recommended for all people born from [birth year] through 1965 and any individual with known risks for hepatitis C.  Practice safe sex. Use condoms and avoid high-risk sexual practices to reduce the spread of sexually transmitted infections (STIs). STIs include gonorrhea, chlamydia, syphilis, trichomonas, herpes, HPV, and human immunodeficiency virus (HIV). Herpes, HIV, and HPV are viral illnesses that have no cure. They can result in disability, cancer, and death.  You should be screened for sexually transmitted illnesses (STIs) including gonorrhea and chlamydia if:  You are sexually active and are younger than 24 years.  You are older than 24 years and your health care provider tells you that you are at risk for this type of  infection.  Your sexual activity has changed since you were last screened and you are at an increased risk for chlamydia or gonorrhea. Ask your health care provider if you are at risk.  If you are at risk of being infected with HIV, it is recommended that you take a prescription medicine daily to prevent HIV infection. This is called preexposure prophylaxis (PrEP). You are considered at risk if:  You are a heterosexual woman, are sexually active, and are at increased risk for HIV infection.  You take drugs by injection.  You are sexually active with a partner who has HIV.  Talk with your health care provider  about whether you are at high risk of being infected with HIV. If you choose to begin PrEP, you should first be tested for HIV. You should then be tested every 3 months for as long as you are taking PrEP.  Osteoporosis is a disease in which the bones lose minerals and strength with aging. This can result in serious bone fractures or breaks. The risk of osteoporosis can be identified using a bone density scan. Women ages 95 years and over and women at risk for fractures or osteoporosis should discuss screening with their health care providers. Ask your health care provider whether you should take a calcium supplement or vitamin D to reduce the rate of osteoporosis.  Menopause can be associated with physical symptoms and risks. Hormone replacement therapy is available to decrease symptoms and risks. You should talk to your health care provider about whether hormone replacement therapy is right for you.  Use sunscreen. Apply sunscreen liberally and repeatedly throughout the day. You should seek shade when your shadow is shorter than you. Protect yourself by wearing long sleeves, pants, a wide-brimmed hat, and sunglasses year round, whenever you are outdoors.  Once a month, do a whole body skin exam, using a mirror to look at the skin on your back. Tell your health care provider of new moles,  moles that have irregular borders, moles that are larger than a pencil eraser, or moles that have changed in shape or color.  Stay current with required vaccines (immunizations).  Influenza vaccine. All adults should be immunized every year.  Tetanus, diphtheria, and acellular pertussis (Td, Tdap) vaccine. Pregnant women should receive 1 dose of Tdap vaccine during each pregnancy. The dose should be obtained regardless of the length of time since the last dose. Immunization is preferred during the 27th-36th week of gestation. An adult who has not previously received Tdap or who does not know her vaccine status should receive 1 dose of Tdap. This initial dose should be followed by tetanus and diphtheria toxoids (Td) booster doses every 10 years. Adults with an unknown or incomplete history of completing a 3-dose immunization series with Td-containing vaccines should begin or complete a primary immunization series including a Tdap dose. Adults should receive a Td booster every 10 years.  Varicella vaccine. An adult without evidence of immunity to varicella should receive 2 doses or a second dose if she has previously received 1 dose. Pregnant females who do not have evidence of immunity should receive the first dose after pregnancy. This first dose should be obtained before leaving the health care facility. The second dose should be obtained 4-8 weeks after the first dose.  Human papillomavirus (HPV) vaccine. Females aged 13-26 years who have not received the vaccine previously should obtain the 3-dose series. The vaccine is not recommended for use in pregnant females. However, pregnancy testing is not needed before receiving a dose. If a female is found to be pregnant after receiving a dose, no treatment is needed. In that case, the remaining doses should be delayed until after the pregnancy. Immunization is recommended for any person with an immunocompromised condition through the age of 75 years if she  did not get any or all doses earlier. During the 3-dose series, the second dose should be obtained 4-8 weeks after the first dose. The third dose should be obtained 24 weeks after the first dose and 16 weeks after the second dose.  Zoster vaccine. One dose is recommended for adults aged 27 years or older unless  certain conditions are present.  Measles, mumps, and rubella (MMR) vaccine. Adults born before [birth year] generally are considered immune to measles and mumps. Adults born in [birth year] or later should have 1 or more doses of MMR vaccine unless there is a contraindication to the vaccine or there is laboratory evidence of immunity to each of the three diseases. A routine second dose of MMR vaccine should be obtained at least 28 days after the first dose for students attending postsecondary schools, health care workers, or international travelers. People who received inactivated measles vaccine or an unknown type of measles vaccine during 1963-1967 should receive 2 doses of MMR vaccine. People who received inactivated mumps vaccine or an unknown type of mumps vaccine before 1979 and are at high risk for mumps infection should consider immunization with 2 doses of MMR vaccine. For females of childbearing age, rubella immunity should be determined. If there is no evidence of immunity, females who are not pregnant should be vaccinated. If there is no evidence of immunity, females who are pregnant should delay immunization until after pregnancy. Unvaccinated health care workers born before [birth year] who lack laboratory evidence of measles, mumps, or rubella immunity or laboratory confirmation of disease should consider measles and mumps immunization with 2 doses of MMR vaccine or rubella immunization with 1 dose of MMR vaccine.  Pneumococcal 13-valent conjugate (PCV13) vaccine. When indicated, a person who is uncertain of her immunization history and has no record of immunization should receive the PCV13 vaccine. An adult  aged 48 years or older who has certain medical conditions and has not been previously immunized should receive 1 dose of PCV13 vaccine. This PCV13 should be followed with a dose of pneumococcal polysaccharide (PPSV23) vaccine. The PPSV23 vaccine dose should be obtained at least 8 weeks after the dose of PCV13 vaccine. An adult aged 30 years or older who has certain medical conditions and previously received 1 or more doses of PPSV23 vaccine should receive 1 dose of PCV13. The PCV13 vaccine dose should be obtained 1 or more years after the last PPSV23 vaccine dose.  Pneumococcal polysaccharide (PPSV23) vaccine. When PCV13 is also indicated, PCV13 should be obtained first. All adults aged 58 years and older should be immunized. An adult younger than age 32 years who has certain medical conditions should be immunized. Any person who resides in a nursing home or long-term care facility should be immunized. An adult smoker should be immunized. People with an immunocompromised condition and certain other conditions should receive both PCV13 and PPSV23 vaccines. People with human immunodeficiency virus (HIV) infection should be immunized as soon as possible after diagnosis. Immunization during chemotherapy or radiation therapy should be avoided. Routine use of PPSV23 vaccine is not recommended for American Indians, [place] Natives, or people younger than 65 years unless there are medical conditions that require PPSV23 vaccine. When indicated, people who have unknown immunization and have no record of immunization should receive PPSV23 vaccine. One-time revaccination 5 years after the first dose of PPSV23 is recommended for people aged 19-64 years who have chronic kidney failure, nephrotic syndrome, asplenia, or immunocompromised conditions. People who received 1-2 doses of PPSV23 before age 48 years should receive another dose of PPSV23 vaccine at age 40 years or later if at least 5 years have passed since the previous  dose. Doses of PPSV23 are not needed for people immunized with PPSV23 at or after age 81 years.  Meningococcal vaccine. Adults with asplenia or persistent complement component deficiencies should receive 2 doses of  quadrivalent meningococcal conjugate (MenACWY-D) vaccine. The doses should be obtained at least 2 months apart. Microbiologists working with certain meningococcal bacteria, [employer], people at risk during an outbreak, and people who travel to or live in countries with a high rate of meningitis should be immunized. A first-year college student up through age 35 years who is living in a residence hall should receive a dose if she did not receive a dose on or after her 16th birthday. Adults who have certain high-risk conditions should receive one or more doses of vaccine.  Hepatitis A vaccine. Adults who wish to be protected from this disease, have certain high-risk conditions, work with hepatitis A-infected animals, work in hepatitis A research labs, or travel to or work in countries with a high rate of hepatitis A should be immunized. Adults who were previously unvaccinated and who anticipate close contact with an international adoptee during the first 60 days after arrival in the [place] from a country with a high rate of hepatitis A should be immunized.  Hepatitis B vaccine. Adults who wish to be protected from this disease, have certain high-risk conditions, may be exposed to blood or other infectious body fluids, are household contacts or sex partners of hepatitis B positive people, are clients or workers in certain care facilities, or travel to or work in countries with a high rate of hepatitis B should be immunized.  Haemophilus influenzae type b (Hib) vaccine. A previously unvaccinated person with asplenia or sickle cell disease or having a scheduled splenectomy should receive 1 dose of Hib vaccine. Regardless of previous immunization, a recipient of a hematopoietic stem cell  transplant should receive a 3-dose series 6-12 months after her successful transplant. Hib vaccine is not recommended for adults with HIV infection. Preventive Services / Frequency Ages 23 to 55 years  Blood pressure check.** / Every 1 to 2 years.  Lipid and cholesterol check.** / Every 5 years beginning at age 25.  Clinical breast exam.** / Every 3 years for women in their 71s and 91s.  BRCA-related cancer risk assessment.** / For women who have family members with a BRCA-related cancer (breast, ovarian, tubal, or peritoneal cancers).  Pap test.** / Every 2 years from ages 74 through 14. Every 3 years starting at age 52 through age 51 or 21 with a history of 3 consecutive normal Pap tests.  HPV screening.** / Every 3 years from ages 60 through ages 76 to 67 with a history of 3 consecutive normal Pap tests.  Hepatitis C blood test.** / For any individual with known risks for hepatitis C.  Skin self-exam. / Monthly.  Influenza vaccine. / Every year.  Tetanus, diphtheria, and acellular pertussis (Tdap, Td) vaccine.** / Consult your health care provider. Pregnant women should receive 1 dose of Tdap vaccine during each pregnancy. 1 dose of Td every 10 years.  Varicella vaccine.** / Consult your health care provider. Pregnant females who do not have evidence of immunity should receive the first dose after pregnancy.  HPV vaccine. / 3 doses over 6 months, if 39 and younger. The vaccine is not recommended for use in pregnant females. However, pregnancy testing is not needed before receiving a dose.  Measles, mumps, rubella (MMR) vaccine.** / You need at least 1 dose of MMR if you were born in 1957 or later. You may also need a 2nd dose. For females of childbearing age, rubella immunity should be determined. If there is no evidence of immunity, females who are not pregnant should  be vaccinated. If there is no evidence of immunity, females who are pregnant should delay immunization until after  pregnancy.  Pneumococcal 13-valent conjugate (PCV13) vaccine.** / Consult your health care provider.  Pneumococcal polysaccharide (PPSV23) vaccine.** / 1 to 2 doses if you smoke cigarettes or if you have certain conditions.  Meningococcal vaccine.** / 1 dose if you are age 69 to 43 years and a Market researcher living in a residence hall, or have one of several medical conditions, you need to get vaccinated against meningococcal disease. You may also need additional booster doses.  Hepatitis A vaccine.** / Consult your health care provider.  Hepatitis B vaccine.** / Consult your health care provider.  Haemophilus influenzae type b (Hib) vaccine.** / Consult your health care provider. Ages 18 to 10 years  Blood pressure check.** / Every 1 to 2 years.  Lipid and cholesterol check.** / Every 5 years beginning at age 13 years.  Lung cancer screening. / Every year if you are aged 36-80 years and have a 30-pack-year history of smoking and currently smoke or have quit within the past 15 years. Yearly screening is stopped once you have quit smoking for at least 15 years or develop a health problem that would prevent you from having lung cancer treatment.  Clinical breast exam.** / Every year after age 23 years.  BRCA-related cancer risk assessment.** / For women who have family members with a BRCA-related cancer (breast, ovarian, tubal, or peritoneal cancers).  Mammogram.** / Every year beginning at age 50 years and continuing for as long as you are in good health. Consult with your health care provider.  Pap test.** / Every 3 years starting at age 48 years through age 4 or 41 years with a history of 3 consecutive normal Pap tests.  HPV screening.** / Every 3 years from ages 46 years through ages 53 to 57 years with a history of 3 consecutive normal Pap tests.  Fecal occult blood test (FOBT) of stool. / Every year beginning at age 82 years and continuing until age 58 years. You may  not need to do this test if you get a colonoscopy every 10 years.  Flexible sigmoidoscopy or colonoscopy.** / Every 5 years for a flexible sigmoidoscopy or every 10 years for a colonoscopy beginning at age 61 years and continuing until age 79 years.  Hepatitis C blood test.** / For all people born from [birth year] through 1967 and any individual with known risks for hepatitis C.  Skin self-exam. / Monthly.  Influenza vaccine. / Every year.  Tetanus, diphtheria, and acellular pertussis (Tdap/Td) vaccine.** / Consult your health care provider. Pregnant women should receive 1 dose of Tdap vaccine during each pregnancy. 1 dose of Td every 10 years.  Varicella vaccine.** / Consult your health care provider. Pregnant females who do not have evidence of immunity should receive the first dose after pregnancy.  Zoster vaccine.** / 1 dose for adults aged 57 years or older.  Measles, mumps, rubella (MMR) vaccine.** / You need at least 1 dose of MMR if you were born in 1957 or later. You may also need a 2nd dose. For females of childbearing age, rubella immunity should be determined. If there is no evidence of immunity, females who are not pregnant should be vaccinated. If there is no evidence of immunity, females who are pregnant should delay immunization until after pregnancy.  Pneumococcal 13-valent conjugate (PCV13) vaccine.** / Consult your health care provider.  Pneumococcal polysaccharide (PPSV23) vaccine.** / 1 to 2  doses if you smoke cigarettes or if you have certain conditions.  Meningococcal vaccine.** / Consult your health care provider.  Hepatitis A vaccine.** / Consult your health care provider.  Hepatitis B vaccine.** / Consult your health care provider.  Haemophilus influenzae type b (Hib) vaccine.** / Consult your health care provider. Ages 90 years and over  Blood pressure check.** / Every 1 to 2 years.  Lipid and cholesterol check.** / Every 5 years beginning at age 71 years.  Lung  cancer screening. / Every year if you are aged 25-80 years and have a 30-pack-year history of smoking and currently smoke or have quit within the past 15 years. Yearly screening is stopped once you have quit smoking for at least 15 years or develop a health problem that would prevent you from having lung cancer treatment.  Clinical breast exam.** / Every year after age 2 years.  BRCA-related cancer risk assessment.** / For women who have family members with a BRCA-related cancer (breast, ovarian, tubal, or peritoneal cancers).  Mammogram.** / Every year beginning at age 107 years and continuing for as long as you are in good health. Consult with your health care provider.  Pap test.** / Every 3 years starting at age 16 years through age 46 or 37 years with 3 consecutive normal Pap tests. Testing can be stopped between 65 and 70 years with 3 consecutive normal Pap tests and no abnormal Pap or HPV tests in the past 10 years.  HPV screening.** / Every 3 years from ages 46 years through ages 25 or 18 years with a history of 3 consecutive normal Pap tests. Testing can be stopped between 65 and 70 years with 3 consecutive normal Pap tests and no abnormal Pap or HPV tests in the past 10 years.  Fecal occult blood test (FOBT) of stool. / Every year beginning at age 65 years and continuing until age 60 years. You may not need to do this test if you get a colonoscopy every 10 years.  Flexible sigmoidoscopy or colonoscopy.** / Every 5 years for a flexible sigmoidoscopy or every 10 years for a colonoscopy beginning at age 65 years and continuing until age 20 years.  Hepatitis C blood test.** / For all people born from [birth year] through 1967 and any individual with known risks for hepatitis C.  Osteoporosis screening.** / A one-time screening for women ages 39 years and over and women at risk for fractures or osteoporosis.  Skin self-exam. / Monthly.  Influenza vaccine. / Every year.  Tetanus, diphtheria, and  acellular pertussis (Tdap/Td) vaccine.** / 1 dose of Td every 10 years.  Varicella vaccine.** / Consult your health care provider.  Zoster vaccine.** / 1 dose for adults aged 36 years or older.  Pneumococcal 13-valent conjugate (PCV13) vaccine.** / Consult your health care provider.  Pneumococcal polysaccharide (PPSV23) vaccine.** / 1 dose for all adults aged 49 years and older.  Meningococcal vaccine.** / Consult your health care provider.  Hepatitis A vaccine.** / Consult your health care provider.  Hepatitis B vaccine.** / Consult your health care provider.  Haemophilus influenzae type b (Hib) vaccine.** / Consult your health care provider. ** Family history and personal history of risk and conditions may change your health care provider's recommendations. Document Released: 05/30/2001 Document Revised: 08/18/2013 Document Reviewed: 08/29/2010 [facility] Patient Information 2015 [place], [place]. This information is not intended to replace advice given to you by your health care provider. Make sure you discuss any questions you have with your health care  provider.

## 2014-08-24 NOTE — Assessment & Plan Note (Addendum)
Depression screen negative.  Immunizations, Mammogram and Colonoscopy up-to-date. Followed by GYN for pap.  Will obtain fasting labs today to include CBC, CMP, LFT, A1C, UA. Preventive care discussed.  Handout given in AVS.

## 2014-08-24 NOTE — Assessment & Plan Note (Signed)
Stable. No changes today.

## 2014-08-24 NOTE — Assessment & Plan Note (Signed)
Will obtain repeat US to reassess mass per Radiology recommendations.

## 2014-08-25 ENCOUNTER — Telehealth: Payer: Self-pay | Admitting: Physician Assistant

## 2014-08-25 DIAGNOSIS — E785 Hyperlipidemia, unspecified: Secondary | ICD-10-CM

## 2014-08-25 MED ORDER — ROSUVASTATIN CALCIUM 10 MG PO TABS: 10.0000 mg | ORAL_TABLET | Freq: Every day | ORAL | Status: DC

## 2014-08-25 MED ORDER — LEVOTHYROXINE SODIUM 125 MCG PO TABS: 125.0000 ug | ORAL_TABLET | Freq: Every day | ORAL | Status: DC

## 2014-08-25 NOTE — Telephone Encounter (Signed)
Pt notified of results// pt verbalized understanding and agrees to start rosuvastatin 10 mg , rx sent to pts pharmacy.

## 2014-08-25 NOTE — Telephone Encounter (Signed)
Labs are in.  Overall they look good. Thyroid function shows current dose of levothyroxine is not strong enough.  Will increase to 125 mcg daily.  A new prescription has been sent in to her pharmacy.  Her cholesterol level and TGL have increased since last check. I recommend we start a medication to lower this and reduce her risk for stroke/heart attack. If she is amenable to this, ok to send in Rx for generic rosuvastatin 10 mg daily. 30 tablets with 3 refills.  Follow-up in 3 months to reassess cholesterol. If she noticed muscle aches with medication, she should give me a call.

## 2014-09-01 ENCOUNTER — Ambulatory Visit: Payer: 59 | Admitting: Physician Assistant

## 2014-09-01 ENCOUNTER — Ambulatory Visit (INDEPENDENT_AMBULATORY_CARE_PROVIDER_SITE_OTHER): Payer: 59 | Admitting: Physician Assistant

## 2014-09-01 ENCOUNTER — Encounter: Payer: Self-pay | Admitting: Physician Assistant

## 2014-09-01 VITALS — BP 133/81 | HR 113 | Temp 98.2°F | Ht 63.0 in | Wt 172.0 lb

## 2014-09-01 DIAGNOSIS — N3001 Acute cystitis with hematuria: Secondary | ICD-10-CM | POA: Insufficient documentation

## 2014-09-01 DIAGNOSIS — R8290 Unspecified abnormal findings in urine: Secondary | ICD-10-CM

## 2014-09-01 DIAGNOSIS — R350 Frequency of micturition: Secondary | ICD-10-CM | POA: Diagnosis not present

## 2014-09-01 LAB — POCT URINALYSIS DIPSTICK
Bilirubin, UA: NEGATIVE
Glucose, UA: NEGATIVE
Ketones, UA: NEGATIVE
Nitrite, UA: NEGATIVE
Spec Grav, UA: 1.01
UROBILINOGEN UA: 0.2
pH, UA: 7.5

## 2014-09-01 MED ORDER — CIPROFLOXACIN HCL 250 MG PO TABS: 250.0000 mg | ORAL_TABLET | Freq: Two times a day (BID) | ORAL | Status: DC

## 2014-09-01 NOTE — Patient Instructions (Signed)
Your symptoms are consistent with a bladder infection, also called acute cystitis. Please take your antibiotic (Cipro) as directed until all pills are gone.  Stay very well hydrated.  Consider a daily probiotic (Align, Culturelle, or Activia) to help prevent stomach upset caused by the antibiotic.  Taking a probiotic daily may also help prevent recurrent UTIs.  Also consider taking AZO (Phenazopyridine) tablets to help decrease pain with urination.  I will call you with your urine testing results.  We will change antibiotics if indicated.  Call or return to clinic if symptoms are not resolved by completion of antibiotic.   Urinary Tract Infection A urinary tract infection (UTI) can occur any place along the urinary tract. The tract includes the kidneys, ureters, bladder, and urethra. A type of germ called bacteria often causes a UTI. UTIs are often helped with antibiotic medicine.  HOME CARE   If given, take antibiotics as told by your doctor. Finish them even if you start to feel better.  Drink enough fluids to keep your pee (urine) clear or pale yellow.  Avoid tea, drinks with caffeine, and bubbly (carbonated) drinks.  Pee often. Avoid holding your pee in for a long time.  Pee before and after having sex (intercourse).  Wipe from front to back after you poop (bowel movement) if you are a woman. Use each tissue only once. GET HELP RIGHT AWAY IF:   You have back pain.  You have lower belly (abdominal) pain.  You have chills.  You feel sick to your stomach (nauseous).  You throw up (vomit).  Your burning or discomfort with peeing does not go away.  You have a fever.  Your symptoms are not better in 3 days. MAKE SURE YOU:   Understand these instructions.  Will watch your condition.  Will get help right away if you are not doing well or get worse. Document Released: 09/20/2007 Document Revised: 12/27/2011 Document Reviewed: 11/02/2011 [facility] Patient Information 2015  [place], [place]. This information is not intended to replace advice given to you by your health care provider. Make sure you discuss any questions you have with your health care provider.

## 2014-09-01 NOTE — Assessment & Plan Note (Signed)
Urine dip + for 3+ LE and blood.  Will send for culture.  Will start treatment with Cipro 250 mg BID x 3 days while culture is processing.  Supportive measures discussed. Will alter regimen based on results.

## 2014-09-01 NOTE — Progress Notes (Signed)
Pre visit review using our clinic review tool, if applicable. No additional management support is needed unless otherwise documented below in the visit note. 

## 2014-09-01 NOTE — Progress Notes (Signed)
History of Present Illness: Erin Fields is a 49 y.o. female who present to the clinic today complaining of urinary frequency, urinary urgency and hematuria x 2 days.  Patient endorses low back pain and suprapubic pressure.  Patient denies fever, chills, nausea/vomiting and flank pain.   History: Past Medical History  Diagnosis Date  . Asthma   . Depression   . Frequent headaches   . GERD (gastroesophageal reflux disease)   . Hay fever   . Environmental allergies   . Elevated blood pressure   . Hyperlipidemia   . Migraines   . Colon polyps   . Thyroid disease   . Chicken pox     Current outpatient prescriptions:  .  acyclovir (ZOVIRAX) 400 MG tablet, TAKE 1 TABLET BY MOUTH THREE TIMES DAILY AS NEEDED, Disp: 30 tablet, Rfl: 1 .  BENICAR 40 MG tablet, TAKE 1 TABLET BY MOUTH EVERY NIGHT AT BEDTIME, Disp: 90 tablet, Rfl: 0 .  budesonide-formoterol (SYMBICORT) 160-4.5 MCG/ACT inhaler, Inhale 2 puffs into the lungs 2 (two) times daily., Disp: , Rfl:  .  chlorpheniramine-HYDROcodone (TUSSIONEX PENNKINETIC ER) 10-8 MG/5ML SUER, Take 5 mLs by mouth every 12 (twelve) hours as needed for cough., Disp: 140 mL, Rfl: 0 .  DULERA 200-5 MCG/ACT AERO, , Disp: , Rfl:  .  levalbuterol (XOPENEX HFA) 45 MCG/ACT inhaler, Inhale 2 puffs into the lungs every 4 (four) hours as needed for wheezing (Q4-6 Hours PRN)., Disp: , Rfl:  .  levothyroxine (SYNTHROID, LEVOTHROID) 125 MCG tablet, Take 1 tablet (125 mcg total) by mouth daily., Disp: 90 tablet, Rfl: 3 .  mesalamine (LIALDA) 1.2 G EC tablet, Take 2.4 g by mouth daily., Disp: , Rfl:  .  montelukast (SINGULAIR) 10 MG tablet, Take 10 mg by mouth every morning., Disp: , Rfl:  .  Omega-3 Fatty Acids (FISH OIL) 1200 MG CAPS, Take 2 capsules by mouth at bedtime., Disp: , Rfl:  .  promethazine (PHENERGAN) 25 MG tablet, Take 1 tablet (25 mg total) by mouth every 6 (six) hours as needed for nausea or vomiting., Disp: 30 tablet, Rfl: 3 .  rosuvastatin (CRESTOR) 10  MG tablet, Take 1 tablet (10 mg total) by mouth daily., Disp: 30 tablet, Rfl: 3 .  triamcinolone (NASACORT ALLERGY 24HR) 55 MCG/ACT AERO nasal inhaler, Place 1 spray into the nose daily., Disp: , Rfl:  .  venlafaxine XR (EFFEXOR-XR) 75 MG 24 hr capsule, TAKE 1 CAPSULE BY MOUTH EVERY DAY WITH BREAKFAST, Disp: 90 capsule, Rfl: 1 .  zolpidem (AMBIEN) 5 MG tablet, TAKE 1 TABLET BY MOUTH EVERY DAY AT BEDTIME AS NEEDED, Disp: 30 tablet, Rfl: 1 .  ciprofloxacin (CIPRO) 250 MG tablet, Take 1 tablet (250 mg total) by mouth 2 (two) times daily., Disp: 6 tablet, Rfl: 0 Allergies  Allergen Reactions  . Ace Inhibitors Other (See Comments)    Angina  . Cefaclor   . Lisinopril Cough  . Losartan Potassium   . Sulfonamide Derivatives    Family History  Problem Relation Age of Onset  . Arthritis Mother 87    Deceased  . Breast cancer Mother   . Lung cancer Mother   . Hyperlipidemia Mother   . Heart disease Mother   . Hypertension Mother   . Diabetes Mother   . Crohn's disease Mother   . Diabetes Maternal Grandfather   . Heart disease Maternal Grandfather   . Heart attack Maternal Grandfather   . Colitis Sister    History   Social History  .  Marital Status: Married    Spouse Name: N/A  . Number of Children: N/A  . Years of Education: N/A   Social History Main Topics  . Smoking status: Former Research scientist (life sciences)  . Smokeless tobacco: Never Used     Comment: Quit >20 yrs ago  . Alcohol Use: 1.8 oz/week    3 Glasses of wine per week  . Drug Use: No  . Sexual Activity: Not on file   Other Topics Concern  . None   Social History Narrative    Review of Systems: See HPI.  All other ROS are negative.  Physical Examination: BP 133/81 mmHg  Pulse 113  Temp(Src) 98.2 F (36.8 C) (Oral)  Ht 5' 3"  (1.6 m)  Wt 172 lb (78.019 kg)  BMI 30.48 kg/m2  SpO2 98%  General appearance: alert, cooperative, appears stated age and no distress Head: Normocephalic, without obvious abnormality,  atraumatic Lungs: clear to auscultation bilaterally Heart: regular rate and rhythm, S1, S2 normal, no murmur, click, rub or gallop Abdomen: soft, non-tender; bowel sounds normal; no masses,  no organomegaly and no CVA tenderness.   Assessment/Plan: Acute cystitis with hematuria Urine dip + for 3+ LE and blood.  Will send for culture.  Will start treatment with Cipro 250 mg BID x 3 days while culture is processing.  Supportive measures discussed. Will alter regimen based on results.

## 2014-09-03 LAB — URINE CULTURE: Colony Count: >=100000

## 2014-09-04 ENCOUNTER — Other Ambulatory Visit: Payer: Self-pay | Admitting: General Practice

## 2014-09-04 ENCOUNTER — Telehealth: Payer: Self-pay | Admitting: Physician Assistant

## 2014-09-04 MED ORDER — CEFUROXIME AXETIL 500 MG PO TABS: 500.0000 mg | ORAL_TABLET | Freq: Two times a day (BID) | ORAL | Status: DC

## 2014-09-04 NOTE — Telephone Encounter (Signed)
Med filled to pharmacy as requested. Pt notified.

## 2014-09-04 NOTE — Telephone Encounter (Signed)
Caller name: Hiba Garry Relationship to patient: self Can be reached: [phone]  Reason for call: Pt returning call to [facility]. Advised that antibiotic is being changed. Pt is out of town. She is going to locate a pharmacy and call back with where to call meds into.

## 2014-09-04 NOTE — Telephone Encounter (Signed)
Phamacy: [facility] in [place], Ph# [phone] Please notify pt once med is called in.

## 2014-09-15 ENCOUNTER — Ambulatory Visit (INDEPENDENT_AMBULATORY_CARE_PROVIDER_SITE_OTHER): Payer: 59 | Admitting: Pulmonary Disease

## 2014-09-15 ENCOUNTER — Other Ambulatory Visit (INDEPENDENT_AMBULATORY_CARE_PROVIDER_SITE_OTHER): Payer: 59

## 2014-09-15 ENCOUNTER — Encounter: Payer: Self-pay | Admitting: Pulmonary Disease

## 2014-09-15 VITALS — BP 122/80 | HR 98 | Temp 97.6°F | Ht 63.0 in | Wt 172.4 lb

## 2014-09-15 DIAGNOSIS — J3089 Other allergic rhinitis: Secondary | ICD-10-CM

## 2014-09-15 DIAGNOSIS — J4540 Moderate persistent asthma, uncomplicated: Secondary | ICD-10-CM

## 2014-09-15 DIAGNOSIS — R0600 Dyspnea, unspecified: Secondary | ICD-10-CM | POA: Diagnosis not present

## 2014-09-15 DIAGNOSIS — J309 Allergic rhinitis, unspecified: Secondary | ICD-10-CM | POA: Insufficient documentation

## 2014-09-15 HISTORY — DX: Dyspnea, unspecified: R06.00

## 2014-09-15 HISTORY — DX: Other allergic rhinitis: J30.89

## 2014-09-15 LAB — BASIC METABOLIC PANEL
BUN: 7 mg/dL (ref 6–23)
CHLORIDE: 103 meq/L (ref 96–112)
CO2: 28 mEq/L (ref 19–32)
CREATININE: 0.65 mg/dL (ref 0.40–1.20)
Calcium: 10 mg/dL (ref 8.4–10.5)
GFR: 101.98 mL/min (ref >60.00)
Glucose, Bld: 108 mg/dL — ABNORMAL HIGH (ref 70–99)
Potassium: 4.1 mEq/L (ref 3.5–5.1)
SODIUM: 139 meq/L (ref 135–145)

## 2014-09-15 LAB — CBC WITH DIFFERENTIAL/PLATELET
Basophils Absolute: 0 10*3/uL (ref 0.0–0.1)
Basophils Relative: 0.5 % (ref 0.0–3.0)
Eosinophils Absolute: 0.1 10*3/uL (ref 0.0–0.7)
Eosinophils Relative: 1.1 % (ref 0.0–5.0)
HCT: 39 % (ref 36.0–46.0)
Hemoglobin: 13.3 g/dL (ref 12.0–15.0)
Lymphocytes Relative: 27 % (ref 12.0–46.0)
Lymphs Abs: 2.3 10*3/uL (ref 0.7–4.0)
MCHC: 34 g/dL (ref 30.0–36.0)
MCV: 86.9 fl (ref 78.0–100.0)
MONO ABS: 0.8 10*3/uL (ref 0.1–1.0)
Monocytes Relative: 9.1 % (ref 3.0–12.0)
NEUTROS ABS: 5.3 10*3/uL (ref 1.4–7.7)
Neutrophils Relative %: 62.3 % (ref 43.0–77.0)
Platelets: 430 10*3/uL — ABNORMAL HIGH (ref 150.0–400.0)
RBC: 4.49 Mil/uL (ref 3.87–5.11)
RDW: 13.9 % (ref 11.5–15.5)
WBC: 8.5 10*3/uL (ref 4.0–10.5)

## 2014-09-15 NOTE — Progress Notes (Signed)
Subjective:     Patient ID: [place] [name], female   DOB: 08/10/1965, 49 y.o.   MRN: [id number]  HPI 49 y/o [name], referred by Erin Aquas PA at [name], for pulmonary evaluation>  She has a hx Asthma, AR and recurrent bronchitis & sinusitis, currently managed by [name]- she was prev seen by [name] in 2006 but all records are on the old paper charts;  Erin [name] is c/o 49mohx cough, prod of a small amt of green phlegm, and increased SOB/DOE progressing to problem w/ ADLs recently; she notes occ left CP "like stress on my heart", worse w/ a deep breath and when questioned further the SOB she is experiencing is a sensation that she can't get the air "IN", can't get a good deep breath & doesn't feel satisfied breathing; she also reports a hx of GERD & Crohn's dis- followed by [name]& she takes ?OTC Prevacid Bid but not following an anti-reflux regimen...      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       Current Meds> Dulera200- 2spBid, Singulair10, XopenexHFA (using it 1-2 times daily she says); Astelin- 2spBid, Nasacort- 2spQhs, Tussionex prn, on Allergy shots for >20y89yrrev testing allergic to everything she says)  EXAM shows Afeb, VSS, O2sat=96% on RA;  HEENT- neg, mallampati1;  Chest- clear w/o w/r/r;  Heart- RR w/o m/r/g;  Abd- soft, neg;  Ext- w/o c/c/e...   Old  data from Paper Chart is not available to review...  CXR 08/13/14 showed norm heart size, mild right diaph eventration, clear lungs, NAD...   LABS 5/16:  Chems- wnl x BS=122, A1c=6.4;  CBC- wnl, diff not done- older data shows norm diff (1%eos);  TSH=0.18;   She had EColi UTI 5/16 treated w/ Ceftin500Bid...   Spirometry 09/15/14 showed FVC=2.75 (87%), FEV1=2.33 (90%), %1sec=85, mid-flows were wnl at 105% predicted...   Additional LABS> IgE level= 129, RAST panel all neg x Ragweed at 0.16 (low titer)...   CT Chest >> ordered & pending  IMP/PLAN>>  [name] a signif remote smoking hx, but her current PFTs are wnl mitigating against COPD & in favor of her Asthma diagnosis;  She appears to be well controlled on her current regimen w/ Dulera, Singulair, Xopenex, Tussionex;  The IgE level is sl elev but the RAST panel is ok x low level Ragweed antibodies;  She appears to get freq upper resp (sinus/ bronchitis) & this exacerbates her asthma;  In addition she has a long hx of GERD on OTC Prevacid Bid but no following an antireflux regimen- we reviewed the needed elev HOB, NPO after dinner, etc... She is asked to start back on an exercise program eg- bike or swimming (non-weight bearing due to her knee)... CT Chest- pending.  Past Medical History  Diagnosis Date  . Asthma >> see above...   . Depression >> on EffexorXR75 and Ambien5...   . Frequent headaches   . GERD (gastroesophageal reflux disease) >> on Prevacid Bid   . Hay fever   . Crohn's Disease >> on Lialda 1.2gm- 2/d from [name]   . Elevated blood pressure >> on Benicar40...   . Hyperlipidemia >> on Crestor10   . Migraines   . Colon polyps   . Thyroid disease >> on Synthroid125...    . Chicken pox     Past Surgical History  Procedure Laterality Date  . Foot surgery  2012  . Ablation  2011  . Tonsillectomy  1980  . Wisdom tooth extraction    . Vein surgery      Removal Right Leg  . Hand surgery      Outpatient Encounter  Prescriptions as of 09/15/2014  Medication Sig  . acyclovir (ZOVIRAX) 400 MG tablet TAKE 1 TABLET BY MOUTH THREE TIMES DAILY AS NEEDED  . azelastine (ASTELIN) 0.1 % nasal spray 2 sprays 2 (two) times daily.  Marland Kitchen BENICAR [facility] MG tablet TAKE 1 TABLET BY MOUTH EVERY NIGHT AT BEDTIME  . chlorpheniramine-HYDROcodone (TUSSIONEX PENNKINETIC ER) 10-8 MG/5ML SUER Take 5 mLs by mouth every 12 (twelve) hours as needed for cough.  . DULERA 200-5 MCG/ACT AERO Inhale 2 puffs into the lungs 2 (two) times daily.   Marland Kitchen levalbuterol (XOPENEX HFA) 45 MCG/ACT inhaler Inhale 2 puffs into the lungs every 4 (four) hours as needed for wheezing (Q4-6 Hours PRN).  Marland Kitchen levothyroxine (SYNTHROID, LEVOTHROID) 125 MCG tablet Take 1 tablet (125 mcg total) by mouth daily.  . mesalamine (LIALDA) 1.2 G EC tablet Take 2.4 g by mouth daily.  . montelukast (SINGULAIR) 10 MG tablet Take 10 mg by mouth at bedtime.   . promethazine (PHENERGAN) 25 MG tablet Take 1 tablet (25 mg total) by mouth every 6 (six) hours as needed for nausea or vomiting.  . rosuvastatin (CRESTOR) 10 MG tablet Take 1 tablet (10 mg total) by mouth daily.  Marland Kitchen triamcinolone (NASACORT ALLERGY 24HR) 55 MCG/ACT AERO nasal inhaler Place 1 spray into the nose daily.  Marland Kitchen venlafaxine XR (EFFEXOR-XR) 75 MG 24 hr capsule TAKE 1 CAPSULE BY MOUTH EVERY DAY WITH BREAKFAST  . zolpidem (AMBIEN) 5 MG tablet TAKE 1 TABLET BY MOUTH EVERY DAY AT BEDTIME AS NEEDED  . budesonide-formoterol (SYMBICORT) 160-4.5 MCG/ACT inhaler Inhale 2 puffs into the lungs 2 (two) times daily.  . [DISCONTINUED] cefUROXime (CEFTIN) 500 MG tablet Take 1 tablet (500 mg total) by mouth 2 (two) times daily with a meal. (Patient not taking: Reported on 09/15/2014)  . [DISCONTINUED] ciprofloxacin (CIPRO) 250 MG tablet Take 1 tablet (250 mg total) by mouth 2 (two) times daily. (Patient not taking: Reported on 09/15/2014)  . [DISCONTINUED] Omega-3 Fatty Acids (FISH OIL) 1200 MG CAPS Take 2 capsules by mouth at bedtime.   No  facility-administered encounter medications on file as of 09/15/2014.    Allergies  Allergen Reactions  . Ace Inhibitors Other (See Comments)    Angina  . Cefaclor   . Lisinopril Cough  . Losartan Potassium   . Sulfonamide Derivatives     Immunization History  Administered Date(s) Administered  . Influenza-Unspecified 12/16/2012, 01/15/2014  . Pneumococcal Polysaccharide-23 04/17/2004, 04/14/2013  . Td 04/17/1997  . Tdap 07/22/2008    Family History  Problem Relation Age of Onset  . Arthritis Mother 91    Deceased  . Breast cancer  Mother   . Lung cancer Mother   . Hyperlipidemia Mother   . Heart disease Mother   . Hypertension Mother   . Diabetes Mother   . Crohn's disease Mother   . Diabetes Maternal Grandfather   . Heart disease Maternal Grandfather   . Heart attack Maternal Grandfather   . Colitis Sister     History   Social History  . Marital Status: Married    Spouse Name: N/A  . Number of Children: N/A  . Years of Education: N/A   Occupational History  . Not on file.   Social History Main Topics  . Smoking status: Former Smoker -- 4.00 packs/day for 15 years    Types: Cigarettes    Quit date: 09/14/1993  . Smokeless tobacco: Never Used     Comment: Quit >20 yrs ago  . Alcohol Use: 1.8 oz/week    3 Glasses of wine per week  . Drug Use: No  . Sexual Activity: Not on file   Other Topics Concern  . Not on file   Social History Narrative    Current Medications, Allergies, Past Medical History, Past Surgical History, Family History, and Social History were reviewed in [employer] record.   Review of Systems            All symptoms NEG except where BOLDED >>  Constitutional:  F/C/S, fatigue, anorexia, unexpected weight change. HEENT:  HA, visual changes, hearing loss, earache, nasal symptoms, sore throat, mouth sores, hoarseness. Resp:  cough, sputum, hemoptysis; SOB, tightness, wheezing. Cardio:  CP, palpit, DOE,  orthopnea, edema. GI:  N/V/D/C, blood in stool; reflux, abd pain, distention, gas. GU:  dysuria, freq, urgency, hematuria, flank pain, voiding difficulty. MS:  joint pain, swelling, tenderness, decr ROM; neck pain, back pain, etc. Neuro:  HA, tremors, seizures, dizziness, syncope, weakness, numbness, gait abn. Skin:  suspicious lesions or skin rash. Heme:  adenopathy, bruising, bleeding. Psyche:  confusion, agitation, sleep disturbance, hallucinations, anxiety, depression suicidal.   Objective:   Physical Exam      Vital Signs:  Reviewed...  General:  WD, WN, [name] in NAD; alert & oriented; pleasant & cooperative... HEENT:  [place]/AT; Conjunctiva- pink, Sclera- nonicteric, EOM-wnl, PERRLA, EACs-clear, TMs-wnl; NOSE-clear; THROAT-clear & wnl. Neck:  Supple w/ full ROM; no JVD; normal carotid impulses w/o bruits; no thyromegaly or nodules palpated; no lymphadenopathy. Chest:  Clear to P & A; without wheezes, rales, or rhonchi heard. Heart:  Regular Rhythm; norm S1 & S2 without murmurs, rubs, or gallops detected. Abdomen:  Soft & nontender- no guarding or rebound; normal bowel sounds; no organomegaly or masses palpated. Ext:  decrROM; without deformities, + arthritic changes; no varicose veins, venous insuffic, or edema;  Pulses intact w/o bruits. Neuro:  No focal neuro findings; gait normal & balance OK. Derm:  No lesions noted; no rash etc. Lymph:  No cervical, supraclavicular, axillary, or inguinal adenopathy palpated.   Assessment:     IMP >>  Dyspnea Asthma Asthmatic Bronchitis -- frequent infectious exacerbations Ex-smoker Allergic rhinitis  PLAN >>  [name] has a signif remote smoking hx, but her current PFTs are wnl mitigating against COPD & in favor of her Asthma diagnosis;  She appears to be well controlled on her current regimen w/ Dulera, Singulair, Xopenex, Tussionex;  The IgE level is sl elev but the RAST panel is ok x low level Ragweed antibodies;  She appears to get  freq upper resp (sinus/ bronchitis) & this exacerbates her asthma;  In addition she has a long hx of GERD on OTC Prevacid Bid but no following an antireflux regimen- we reviewed the needed elev HOB, NPO after dinner, etc... She is asked to start back on an exercise program eg- bike or swimming (non-weight bearing due to her knee)... CT Chest- pending...      Plan:     Patient's Medications  New Prescriptions   No medications on file  Previous Medications   ACYCLOVIR (ZOVIRAX) 400 MG TABLET    TAKE 1 TABLET BY MOUTH THREE TIMES DAILY AS NEEDED   AZELASTINE (ASTELIN) 0.1 % NASAL SPRAY    2 sprays 2 (two) times daily.   BENICAR Erin MG TABLET    TAKE 1 TABLET BY MOUTH EVERY NIGHT AT BEDTIME   BUDESONIDE-FORMOTEROL (SYMBICORT) 160-4.5 MCG/ACT INHALER    Inhale 2 puffs into the lungs 2 (two) times daily.   CHLORPHENIRAMINE-HYDROCODONE (TUSSIONEX PENNKINETIC ER) 10-8 MG/5ML SUER    Take 5 mLs by mouth every 12 (twelve) hours as needed for cough.   DULERA 200-5 MCG/ACT AERO    Inhale 2 puffs into the lungs 2 (two) times daily.    LEVALBUTEROL (XOPENEX HFA) 45 MCG/ACT INHALER    Inhale 2 puffs into the lungs every 4 (four) hours as needed for wheezing (Q4-6 Hours PRN).   LEVOTHYROXINE (SYNTHROID, LEVOTHROID) 125 MCG TABLET    Take 1 tablet (125 mcg total) by mouth daily.   MESALAMINE (LIALDA) 1.2 G EC TABLET    Take 2.4 g by mouth daily.   MONTELUKAST (SINGULAIR) 10 MG TABLET    Take 10 mg by mouth at bedtime.    PROMETHAZINE (PHENERGAN) 25 MG TABLET    Take 1 tablet (25 mg total) by mouth every 6 (six) hours as needed for nausea or vomiting.   ROSUVASTATIN (CRESTOR) 10 MG TABLET    Take 1 tablet (10 mg total) by mouth daily.   TRIAMCINOLONE (NASACORT ALLERGY 24HR) 55 MCG/ACT AERO NASAL INHALER    Place 1 spray into the nose daily.   VENLAFAXINE XR (EFFEXOR-XR) 75 MG 24 HR CAPSULE    TAKE 1 CAPSULE BY MOUTH EVERY DAY WITH BREAKFAST   ZOLPIDEM (AMBIEN) 5 MG TABLET    TAKE 1 TABLET BY MOUTH EVERY DAY AT  BEDTIME AS NEEDED  Modified Medications   No medications on file  Discontinued Medications   CEFUROXIME (CEFTIN) 500 MG TABLET    Take 1 tablet (500 mg total) by mouth 2 (two) times daily with a meal.   CIPROFLOXACIN (CIPRO) 250 MG TABLET    Take 1 tablet (250 mg total) by mouth 2 (two) times daily.   OMEGA-3 FATTY ACIDS (FISH OIL) 1200 MG CAPS    Take 2 capsules by mouth at bedtime.

## 2014-09-15 NOTE — Patient Instructions (Signed)
[name]- it was nice meeting you today...  We discussed your asthma and allergies, plus your recent refractory asthmatic bronchitis episode...  Today we did a pulmonary function test, and ordered some additional blood work and a CT scan of your chest...    We will contact you w/ the results when available...   In the meanwhile>    Continue the Dulera200- 2 sprays twice daily...    Continue the [facility] HFA rescue inhaler as needed...    Continue your other meds including the Astelin/ Nasacort/ Singulair/ and allergy shots...    You may also use the Tussionex as needed for cough...  For the reflux problem>    Continue the PREVACID twice daily (best to take this med about 30 min before breakfast & dinner)    Do not eat or drink much after dinner in the eve...    Elevate the head of your bed on 6" blocks...  Finally we discussed a gradual exercise program- eg. Exercise bike 7 slowly increase your time on the machine...  Call for any questions...  Let's plan a follow up visit in about 6 week for a recheck, sooner if needed for problems.Marland KitchenMarland Kitchen

## 2014-09-16 LAB — ALLERGY FULL PROFILE
Allergen,Goose feathers, e70: <0.1 kU/L
Alternaria Alternata: <0.1 kU/L
Aspergillus fumigatus, m3: <0.1 kU/L
Bahia Grass: <0.1 kU/L
Candida Albicans: <0.1 kU/L
Cat Dander: <0.1 kU/L
Common Ragweed: 0.16 kU/L — ABNORMAL HIGH
D. farinae: <0.1 kU/L
Dog Dander: <0.1 kU/L
Elm IgE: <0.1 kU/L
Fescue: <0.1 kU/L
G005 Rye, Perennial: <0.1 kU/L
G009 Red Top: <0.1 kU/L
Goldenrod: <0.1 kU/L
House Dust Hollister: <0.1 kU/L
IgE (Immunoglobulin E), Serum: 129 kU/L — ABNORMAL HIGH (ref <115)
Lamb's Quarters: <0.1 kU/L
Oak: <0.1 kU/L
Plantain: <0.1 kU/L
Stemphylium Botryosum: <0.1 kU/L
Sycamore Tree: <0.1 kU/L

## 2014-09-18 ENCOUNTER — Ambulatory Visit (INDEPENDENT_AMBULATORY_CARE_PROVIDER_SITE_OTHER)
Admission: RE | Admit: 2014-09-18 | Discharge: 2014-09-18 | Disposition: A | Discharge status: Home / Self Care | Payer: 59 | Source: Ambulatory Visit | Attending: Pulmonary Disease | Admitting: Pulmonary Disease

## 2014-09-18 DIAGNOSIS — J4540 Moderate persistent asthma, uncomplicated: Secondary | ICD-10-CM | POA: Diagnosis not present

## 2014-09-18 DIAGNOSIS — J3089 Other allergic rhinitis: Secondary | ICD-10-CM | POA: Diagnosis not present

## 2014-09-18 NOTE — Progress Notes (Signed)
Quick Note:  Called and spoke to pt. Informed her of the results and recs per SN. Pt verbalized understanding and denied any further questions or concerns at this time.   ______

## 2014-09-18 NOTE — Progress Notes (Signed)
Quick Note:  Called and spoke to pt. Informed her of the results and recs per SN. Pt verbalized understanding and denied any further questions or concerns at this time. ______

## 2014-09-29 ENCOUNTER — Other Ambulatory Visit: Payer: Self-pay | Admitting: Physician Assistant

## 2014-10-09 ENCOUNTER — Telehealth: Payer: Self-pay | Admitting: Physician Assistant

## 2014-10-09 MED ORDER — LOVASTATIN 10 MG PO TABS: 10.0000 mg | ORAL_TABLET | Freq: Every day | ORAL | Status: DC

## 2014-10-09 NOTE — Telephone Encounter (Signed)
Recommend we try Lovastatin instead of Crestor.  Doesn't as aggressively lower cholesterol but has much lower chance of muscle aches. If she is willing ok to send in Rx lovastatin 10 mg.  Take once daily by mouth.  Quantity 30 with 2 refills. Follow-up 4-6 weeks.

## 2014-10-09 NOTE — Telephone Encounter (Signed)
Please advise.//AB/CMA 

## 2014-10-09 NOTE — Telephone Encounter (Signed)
Caller name: Sterling Ucci Relationship to patient: self Can be reached: [phone] Pharmacy: [facility] on [address]  Reason for call: Pt states that the Crestor does not work for her. She has tried taking every day, every other day, every few days. She states that she is very achy. Muscles and Joints hurt. Please follow up with pt.

## 2014-10-09 NOTE — Telephone Encounter (Signed)
Called and spoke with the pt and informed her of Cody's recommendation below.  Pt verbalized understanding and agreed.  New prescription for the Lovastatin 61m sent to the pharmacy by e-script.  Pt stated that she has a follow-up appt already scheduled with Cody.//AB/CMA

## 2014-10-12 ENCOUNTER — Other Ambulatory Visit: Payer: Self-pay | Admitting: Physician Assistant

## 2014-10-27 ENCOUNTER — Ambulatory Visit: Payer: 59 | Admitting: Pulmonary Disease

## 2014-11-06 ENCOUNTER — Ambulatory Visit: Payer: 59 | Admitting: Pulmonary Disease

## 2014-11-06 ENCOUNTER — Other Ambulatory Visit: Payer: Self-pay | Admitting: Physician Assistant

## 2014-11-17 ENCOUNTER — Encounter: Payer: Self-pay | Admitting: Pulmonary Disease

## 2014-11-17 ENCOUNTER — Ambulatory Visit (INDEPENDENT_AMBULATORY_CARE_PROVIDER_SITE_OTHER): Payer: 59 | Admitting: Pulmonary Disease

## 2014-11-17 VITALS — BP 128/74 | HR 90 | Temp 100.0°F | Ht 63.0 in | Wt 171.0 lb

## 2014-11-17 DIAGNOSIS — J4520 Mild intermittent asthma, uncomplicated: Secondary | ICD-10-CM | POA: Diagnosis not present

## 2014-11-17 DIAGNOSIS — J3089 Other allergic rhinitis: Secondary | ICD-10-CM

## 2014-11-17 DIAGNOSIS — R0600 Dyspnea, unspecified: Secondary | ICD-10-CM | POA: Diagnosis not present

## 2014-11-17 MED ORDER — FIRST-DUKES MOUTHWASH MT SUSP: 5.0000 mL | Freq: Every day | OROMUCOSAL | Status: DC | PRN

## 2014-11-17 NOTE — Patient Instructions (Signed)
Today we updated your med list in our EPIC system...    Continue your current medications the same...  Keep up the good work w/ diet, exercise & the antireflux regimen!  We wrote a new prescription for Magic Mouthwash to use as needed...  Call for any questions...  Let's plan a follow up visit in 23yr sooner if needed for problems..Marland KitchenMarland Kitchen

## 2014-11-17 NOTE — Progress Notes (Signed)
Subjective:     Patient ID: Erin Fields, female   DOB: Feb 13, 1966, 49 y.o.   MRN: [id number]  HPI ~  Sep 15, 2014:  Initial pulmonary consult by SN>        31 y/o WF, referred by Elyn Aquas PA at [facility], for pulmonary evaluation>  She has a hx Asthma, AR and recurrent bronchitis & sinusitis, currently managed by DrSharma- she was prev seen by [facility] in 2006 but all records are on the old paper charts;  Deetta is c/o 8mohx cough, prod of a small amt of green phlegm, and increased SOB/DOE progressing to problem w/ ADLs recently; she notes occ left CP "like stress on my heart", worse w/ a deep breath and when questioned further the SOB she is experiencing is a sensation that she can't get the air "IN", can't get a good deep breath & doesn't feel satisfied breathing; she also reports a hx of GERD & Crohn's dis- [facility] she takes ?OTC Prevacid Bid but not following an anti-reflux regimen...      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       Current Meds> Dulera200- 2spBid, Singulair10, XopenexHFA (using it 1-2 times daily she says); Astelin- 2spBid, Nasacort- 2spQhs, Tussionex prn, on Allergy shots for >20y61yrrev testing allergic to everything she says) EXAM shows Afeb, VSS, O2sat=96% on RA;  HEENT- neg, mallampati1;  Chest- clear w/o w/r/r;  Heart- RR  w/o m/r/g;  Abd- soft, neg;  Ext- w/o c/c/e...   Old data from Paper Chart is not available to review...  CXR 08/13/14 showed norm heart size, mild right diaph eventration, clear lungs, NAD...   LABS 5/16:  Chems- wnl x BS=122, A1c=6.4;  CBC- wnl, diff not done- older data shows norm diff (1%eos);  TSH=0.18;   She had EColi UTI 5/16 treated w/ Ceftin500Bid...   Spirometry 09/15/14 showed FVC=2.75 (87%), FEV1=2.33 (90%), %1sec=85, mid-flows were wnl at 105% predicted...   Additional LABS> IgE level= 129, RAST panel all neg x Ragweed at 0.16 (low titer)...   CT Chest >> done 09/18/14 & showed norm heart size, min atherosclerotic changes in Ao, no adenopathy; tiny 3mm 18m nodules, min emphysema, otherw clear lungs; asymmetric elev of right hemidiaph...  IMP/PLAN>>  KellyElauraa signif remote smoking hx, but her current PFTs are wnl mitigating against COPD & in favor of her Asthma diagnosis;  She appears to be well controlled on her current regimen w/ Dulera, Singulair, Xopenex, Tussionex;  The IgE level is sl elev but the RAST panel is ok x low level Ragweed antibodies;  She appears to get freq upper resp (sinus/ bronchitis) & this exacerbates her asthma;  In addition she has a long hx of GERD on OTC Prevacid  Bid but no following an antireflux regimen- we reviewed the needed elev HOB, NPO after dinner, etc... She is asked to start back on an exercise program eg- bike or swimming (non-weight bearing due to her knee).  ~  November 17, 2014:  7moROV w/ SN>  KEmanireports that she is improved- "it's all good" she says; cough & sput diminished and breathing improved...    Allergic Rhinitis> IgE level was 129 but RAST tests all neg x ragweed; on Singulair, OTC Antihist prn & Nasacort...    Asthma & AB> CXR w/ sl elev of right diaph & CT w/ tiny 344mRUL nodules; on AdvairHFA-110 2spBid (due to insurance) & improved, reminded to rinse etc...    Ex-smoker> former heavy smoker but quit cold [place] ago...  Spirometry wnl 5/16.    GERD> on Prevacid30Bid, [facility]...    Medical issues include> HBP, HL, GERD, Crohn's, DJD, HAs, Depression... PCP is CoElyn Aquast [place]We reviewed prob list, meds, xrays and labs>>  IMP/PLAN>>  KeJazzmans reassured regarding her lung health despite her remote smoking hx; breathing is improved on the Advair110HFA inhaler; she will continue this & her allergy regimen w/ Singulair, antihist, Nasacort, etc; she is also advised to be vigorous in her support of the antireflux regimen & we reviewed this assoc w/ asthma exacerbations... We will plan a routine f/u 1y20yrconsider f/u CT to check the tiny nodules at that time, she will call prn any questions or problems...     Past Medical History  Diagnosis Date  . Asthma >> see above...   . Depression >> on EffexorXR75 and Ambien5...   . Frequent headaches   . GERD (gastroesophageal reflux disease) >> on Prevacid Bid   . Hay fever   . Crohn's Disease >> on Lialda 1.2gm[facility]. Elevated blood pressure >> on Benicar40...   . Hyperlipidemia >> on Crestor10   . Migraines   . Colon polyps   . Thyroid disease >> on Synthroid125...    . Chicken pox     Past Surgical History  Procedure Laterality Date  . Foot surgery  2012  . Ablation  2011  . Tonsillectomy  1980  . Wisdom tooth extraction    . Vein surgery      Removal Right Leg  . Hand surgery      Outpatient Encounter Prescriptions as of 11/17/2014  Medication Sig  . acyclovir (ZOVIRAX) 400 MG tablet TAKE 1 TABLET BY MOUTH THREE TIMES DAILY AS NEEDED  . azelastine (ASTELIN) 0.1 % nasal spray 2 sprays 2 (two) times daily.  . BMarland KitchenNICAR 40 MG tablet TAKE 1 TABLET BY MOUTH EVERY NIGHT AT BEDTIME  . fluticasone-salmeterol (ADVAIR HFA) 115-21 MCG/ACT inhaler Inhale 2 puffs into the lungs 2 (two) times daily.  . lMarland Kitchenvalbuterol (XOPENEX HFA) 45 MCG/ACT inhaler Inhale 2 puffs into the lungs every 4 (four)  hours as needed for wheezing (Q4-6 Hours PRN).  . lMarland Kitchenvothyroxine (SYNTHROID, LEVOTHROID) 125 MCG tablet Take 1 tablet (125 mcg total) by mouth daily.  . lMarland Kitchenvastatin (MEVACOR) 10 MG tablet Take 1 tablet (10 mg total) by mouth at bedtime.  . mesalamine (LIALDA) 1.2 G EC tablet Take 2.4 g by mouth daily.  . montelukast (SINGULAIR) 10 MG tablet Take 10 mg by mouth at bedtime.   . promethazine (PHENERGAN) 25 MG tablet Take 1 tablet (25 mg total) by mouth every 6 (six) hours as  needed for nausea or vomiting.  . triamcinolone (NASACORT ALLERGY 24HR) 55 MCG/ACT AERO nasal inhaler Place 1 spray into the nose daily.  Marland Kitchen venlafaxine XR (EFFEXOR-XR) 75 MG 24 hr capsule TAKE ONE CAPSULE BY MOUTH EVERY DAY WITH BREAKFAST  . zolpidem (AMBIEN) 5 MG tablet TAKE 1 TABLET BY MOUTH EVERY DAY AT BEDTIME AS NEEDED  . [DISCONTINUED] budesonide-formoterol (SYMBICORT) 160-4.5 MCG/ACT inhaler Inhale 2 puffs into the lungs 2 (two) times daily.  . [DISCONTINUED] chlorpheniramine-HYDROcodone (TUSSIONEX PENNKINETIC ER) 10-8 MG/5ML SUER Take 5 mLs by mouth every 12 (twelve) hours as needed for cough. (Patient not taking: Reported on 11/17/2014)  . [DISCONTINUED] DULERA 200-5 MCG/ACT AERO Inhale 2 puffs into the lungs 2 (two) times daily.   . [DISCONTINUED] levothyroxine (SYNTHROID, LEVOTHROID) 100 MCG tablet TAKE 1 TABLET BY MOUTH DAILY BEFORE BREAKFAST (Patient not taking: Reported on 11/17/2014)   No facility-administered encounter medications on file as of 11/17/2014.    Allergies  Allergen Reactions  . Ace Inhibitors Other (See Comments)    Angina  . Cefaclor   . Lisinopril Cough  . Losartan Potassium   . Sulfonamide Derivatives     Immunization History  Administered Date(s) Administered  . Influenza-Unspecified 12/16/2012, 01/15/2014  . Pneumococcal Polysaccharide-23 04/17/2004, 04/14/2013  . Td 04/17/1997  . Tdap 07/22/2008    Current Medications, Allergies, Past Medical History, Past Surgical History, Family  History, and Social History were reviewed in [employer] record.   Review of Systems            All symptoms NEG except where BOLDED >>  Constitutional:  F/C/S, fatigue, anorexia, unexpected weight change. HEENT:  HA, visual changes, hearing loss, earache, nasal symptoms, sore throat, mouth sores, hoarseness. Resp:  cough, sputum, hemoptysis; SOB, tightness, wheezing. Cardio:  CP, palpit, DOE, orthopnea, edema. GI:  N/V/D/C, blood in stool; reflux, abd pain, distention, gas. GU:  dysuria, freq, urgency, hematuria, flank pain, voiding difficulty. MS:  joint pain, swelling, tenderness, decr ROM; neck pain, back pain, etc. Neuro:  HA, tremors, seizures, dizziness, syncope, weakness, numbness, gait abn. Skin:  suspicious lesions or skin rash. Heme:  adenopathy, bruising, bleeding. Psyche:  confusion, agitation, sleep disturbance, hallucinations, anxiety, depression suicidal.   Objective:   Physical Exam      Vital Signs:  Reviewed...  General:  WD, WN, 49 y/o WF in NAD; alert & oriented; pleasant & cooperative... HEENT:  [place]/AT; Conjunctiva- pink, Sclera- nonicteric, EOM-wnl, PERRLA, EACs-clear, TMs-wnl; NOSE-clear; THROAT-clear & wnl. Neck:  Supple w/ full ROM; no JVD; normal carotid impulses w/o bruits; no thyromegaly or nodules palpated; no lymphadenopathy. Chest:  Clear to P & A; without wheezes, rales, or rhonchi heard. Heart:  Regular Rhythm; norm S1 & S2 without murmurs, rubs, or gallops detected. Abdomen:  Soft & nontender- no guarding or rebound; normal bowel sounds; no organomegaly or masses palpated. Ext:  decrROM; without deformities, + arthritic changes; no varicose veins, venous insuffic, or edema;  Pulses intact w/o bruits. Neuro:  No focal neuro findings; gait normal & balance OK. Derm:  No lesions noted; no rash etc. Lymph:  No cervical, supraclavicular, axillary, or inguinal adenopathy palpated.   Assessment:      IMP >>     Allergic  Rhinitis> IgE level was 129 but RAST tests all neg x ragweed; on Singulair, OTC Antihist prn & Nasacort...    Asthma & AB> CXR w/ sl elev of right diaph & CT w/ tiny 14m RUL nodules; on AdvairHFA-110 2spBid (due to insurance) &  improved, reminded to rinse etc...    Ex-smoker> former heavy smoker but quit cold [place] 20+yrs ago... Spirometry wnl 5/16.    GERD> on Prevacid30Bid, followed by [facility]; we reviewed the antireflux regimen...    Medical issues include> HBP, HL, GERD, Crohn's, DJD, HAs, Depression... PCP is Elyn Aquas at [place].   PLAN >>  Haylee has a signif remote smoking hx, but her current PFTs are wnl mitigating against COPD & in favor of her Asthma diagnosis;  She appears to be well controlled on her current regimen w/ Dulera, Singulair, Xopenex, Tussionex;  The IgE level is sl elev but the RAST panel is ok x low level Ragweed antibodies;  She appears to get freq upper resp (sinus/ bronchitis) & this exacerbates her asthma;  In addition she has a long hx of GERD on OTC Prevacid Bid but no following an antireflux regimen- we reviewed the needed elev HOB, NPO after dinner, etc... She is asked to start back on an exercise program eg- bike or swimming (non-weight bearing due to her knee)... CT Chest- pending...      Plan:     Patient's Medications  New Prescriptions   DIPHENHYD-HYDROCORT-NYSTATIN (FIRST-DUKES MOUTHWASH) SUSP    Use as directed 5 mLs in the mouth or throat daily as needed. Swish and spit  Previous Medications   AZELASTINE (ASTELIN) 0.1 % NASAL SPRAY    2 sprays 2 (two) times daily.   BENICAR 40 MG TABLET    TAKE 1 TABLET BY MOUTH EVERY NIGHT AT BEDTIME   FLUTICASONE-SALMETEROL (ADVAIR HFA) 115-21 MCG/ACT INHALER    Inhale 2 puffs into the lungs 2 (two) times daily.   LEVALBUTEROL (XOPENEX HFA) 45 MCG/ACT INHALER    Inhale 2 puffs into the lungs every 4 (four) hours as needed for wheezing (Q4-6 Hours PRN).   LEVOTHYROXINE (SYNTHROID, LEVOTHROID) 125 MCG TABLET     Take 1 tablet (125 mcg total) by mouth daily.   LOVASTATIN (MEVACOR) 10 MG TABLET    Take 1 tablet (10 mg total) by mouth at bedtime.   MESALAMINE (LIALDA) 1.2 G EC TABLET    Take 2.4 g by mouth daily.   MONTELUKAST (SINGULAIR) 10 MG TABLET    Take 10 mg by mouth at bedtime.    PROMETHAZINE (PHENERGAN) 25 MG TABLET    Take 1 tablet (25 mg total) by mouth every 6 (six) hours as needed for nausea or vomiting.   TRIAMCINOLONE (NASACORT ALLERGY 24HR) 55 MCG/ACT AERO NASAL INHALER    Place 1 spray into the nose daily.   VENLAFAXINE XR (EFFEXOR-XR) 75 MG 24 HR CAPSULE    TAKE ONE CAPSULE BY MOUTH EVERY DAY WITH BREAKFAST   ZOLPIDEM (AMBIEN) 5 MG TABLET    TAKE 1 TABLET BY MOUTH EVERY DAY AT BEDTIME AS NEEDED  Modified Medications   Modified Medication Previous Medication   ACYCLOVIR (ZOVIRAX) 400 MG TABLET acyclovir (ZOVIRAX) 400 MG tablet      TAKE 1 TABLET BY MOUTH THREE TIMES DAILY AS NEEDED    TAKE 1 TABLET BY MOUTH THREE TIMES DAILY AS NEEDED  Discontinued Medications   BUDESONIDE-FORMOTEROL (SYMBICORT) 160-4.5 MCG/ACT INHALER    Inhale 2 puffs into the lungs 2 (two) times daily.   CHLORPHENIRAMINE-HYDROCODONE (TUSSIONEX PENNKINETIC ER) 10-8 MG/5ML SUER    Take 5 mLs by mouth every 12 (twelve) hours as needed for cough.   DULERA 200-5 MCG/ACT AERO    Inhale 2 puffs into the lungs 2 (two) times daily.  LEVOTHYROXINE (SYNTHROID, LEVOTHROID) 100 MCG TABLET    TAKE 1 TABLET BY MOUTH DAILY BEFORE BREAKFAST

## 2014-11-18 ENCOUNTER — Other Ambulatory Visit: Payer: Self-pay | Admitting: Physician Assistant

## 2014-11-19 ENCOUNTER — Telehealth: Payer: Self-pay | Admitting: Pulmonary Disease

## 2014-11-19 NOTE — Telephone Encounter (Signed)
ATC [facility] and was on hold x 13 min and no one answered. WCB

## 2014-11-20 NOTE — Telephone Encounter (Signed)
Spoke with Anderson Malta at [facility]. Gave her the verbal to change the mouthwash. Nothing further was needed at this time.

## 2014-11-24 ENCOUNTER — Telehealth: Payer: Self-pay | Admitting: *Deleted

## 2014-11-24 NOTE — Telephone Encounter (Signed)
Per SN, Ok to initiate PA for mouthwash

## 2014-11-24 NOTE — Telephone Encounter (Signed)
Called to initate the PA for Duke's mouthwash. Whitney at [facility] [phone]) states that it is a "plan exclusion" which means the patient can call and get it added as a formulary to have it covered. She states they can't do PA's on these type of medications.   Will call pt to inform her. Tried to call pt. LMOM for pt to give me a call back so that I can explain that she needs to call the number on her insurance card and have them to add the medication as a formulary so that it will be covered.  Will await call back from pt.

## 2014-11-24 NOTE — Telephone Encounter (Signed)
Received a PA for Dukes Mouthwash. Would SN like for me to proceed with the PA or try something different?

## 2014-11-26 NOTE — Telephone Encounter (Signed)
LMOM for pt to return call. 

## 2014-11-26 NOTE — Telephone Encounter (Signed)
Per note pt will call back. Will await call

## 2014-11-26 NOTE — Telephone Encounter (Signed)
Patient returned call, she is out of town at a conference and will call back.

## 2014-12-01 ENCOUNTER — Ambulatory Visit: Payer: 59 | Admitting: Physician Assistant

## 2014-12-02 NOTE — Telephone Encounter (Signed)
Patient notified.  Nothing further needed. 

## 2014-12-02 NOTE — Telephone Encounter (Signed)
Pt returning call and can be reached @ [phone].Erin Fields

## 2014-12-04 ENCOUNTER — Ambulatory Visit (INDEPENDENT_AMBULATORY_CARE_PROVIDER_SITE_OTHER): Payer: 59 | Admitting: Physician Assistant

## 2014-12-04 ENCOUNTER — Other Ambulatory Visit: Payer: Self-pay | Admitting: Physician Assistant

## 2014-12-04 ENCOUNTER — Encounter: Payer: Self-pay | Admitting: Physician Assistant

## 2014-12-04 VITALS — BP 130/74 | HR 86 | Temp 98.2°F | Ht 63.0 in | Wt 169.4 lb

## 2014-12-04 DIAGNOSIS — H6980 Other specified disorders of Eustachian tube, unspecified ear: Secondary | ICD-10-CM | POA: Insufficient documentation

## 2014-12-04 DIAGNOSIS — E782 Mixed hyperlipidemia: Secondary | ICD-10-CM

## 2014-12-04 DIAGNOSIS — H6990 Unspecified Eustachian tube disorder, unspecified ear: Secondary | ICD-10-CM | POA: Insufficient documentation

## 2014-12-04 DIAGNOSIS — H6982 Other specified disorders of Eustachian tube, left ear: Secondary | ICD-10-CM

## 2014-12-04 DIAGNOSIS — E039 Hypothyroidism, unspecified: Secondary | ICD-10-CM | POA: Diagnosis not present

## 2014-12-04 DIAGNOSIS — E781 Pure hyperglyceridemia: Secondary | ICD-10-CM | POA: Diagnosis not present

## 2014-12-04 HISTORY — DX: Other specified disorders of Eustachian tube, unspecified ear: H69.80

## 2014-12-04 HISTORY — DX: Unspecified eustachian tube disorder, unspecified ear: H69.90

## 2014-12-04 LAB — LIPID PANEL
CHOL/HDL RATIO: 5
Cholesterol: 178 mg/dL (ref 0–200)
HDL: 35.6 mg/dL — ABNORMAL LOW (ref >39.00)
LDL Cholesterol: 114 mg/dL — ABNORMAL HIGH (ref 0–99)
NONHDL: 142.58
Triglycerides: 144 mg/dL (ref 0.0–149.0)
VLDL: 28.8 mg/dL (ref 0.0–40.0)

## 2014-12-04 LAB — TSH: TSH: 0.13 u[IU]/mL — ABNORMAL LOW (ref 0.35–4.50)

## 2014-12-04 MED ORDER — LEVOTHYROXINE SODIUM 112 MCG PO TABS: 112.0000 ug | ORAL_TABLET | Freq: Every day | ORAL | Status: DC

## 2014-12-04 MED ORDER — ICOSAPENT ETHYL 1 G PO CAPS: 1.0000 | ORAL_CAPSULE | Freq: Two times a day (BID) | ORAL | Status: DC

## 2014-12-04 MED ORDER — METHYLPREDNISOLONE ACETATE 40 MG/ML IJ SUSP
40.0000 mg | Freq: Once | INTRAMUSCULAR | Status: AC
  Administered 2014-12-04: 40 mg via INTRAMUSCULAR

## 2014-12-04 MED ORDER — LOVASTATIN 10 MG PO TABS: 10.0000 mg | ORAL_TABLET | Freq: Every day | ORAL | Status: DC

## 2014-12-04 NOTE — Progress Notes (Signed)
Pre visit review using our clinic review tool, if applicable. No additional management support is needed unless otherwise documented below in the visit note. 

## 2014-12-04 NOTE — Patient Instructions (Signed)
Please go to the lab for blood work. I will call you with your results. We will alter your medication regimen based on the results.  Let me know if the shot given today does not help with your symptoms. If no improvement, we will need to refer you to an ear nose and throat specialist.

## 2014-12-04 NOTE — Assessment & Plan Note (Signed)
Continue allergy medication regimen. Intramuscular Depo-Medrol given today by nursing staff. ENT referral if symptoms not improving.

## 2014-12-04 NOTE — Progress Notes (Signed)
Patient presents to clinic today for 61-monthfollow-up of hyperlipidemia and hypothyroidism.  Hyperlipidemia -- Endorses taking Mevacor and Vascepa alternating days to prevent muscle aches. Has been very busy lately and as such, has not changed diet or exercise regimen.  Hypothyroidism -- Endorses taking medication as directed. Is due for repeat TSH.  Patient c/o L ear pressure and popping not relieved by her allergy medications. Denies change in hearing, tinnitus, ear pain or drainage.  Past Medical History  Diagnosis Date  . Asthma   . Depression   . Frequent headaches   . GERD (gastroesophageal reflux disease)   . Hay fever   . Environmental allergies   . Elevated blood pressure   . Hyperlipidemia   . Migraines   . Colon polyps   . Thyroid disease   . Chicken pox     Current Outpatient Prescriptions on File Prior to Visit  Medication Sig Dispense Refill  . acyclovir (ZOVIRAX) 400 MG tablet TAKE 1 TABLET BY MOUTH THREE TIMES DAILY AS NEEDED 30 tablet 5  . azelastine (ASTELIN) 0.1 % nasal spray 2 sprays 2 (two) times daily.  6  . BENICAR 40 MG tablet TAKE 1 TABLET BY MOUTH EVERY NIGHT AT BEDTIME 90 tablet 0  . fluticasone-salmeterol (ADVAIR HFA) 115-21 MCG/ACT inhaler Inhale 2 puffs into the lungs 2 (two) times daily.    .Marland Kitchenlevalbuterol (XOPENEX HFA) 45 MCG/ACT inhaler Inhale 2 puffs into the lungs every 4 (four) hours as needed for wheezing (Q4-6 Hours PRN).    .Marland Kitchenlevothyroxine (SYNTHROID, LEVOTHROID) 125 MCG tablet Take 1 tablet (125 mcg total) by mouth daily. 90 tablet 3  . lovastatin (MEVACOR) 10 MG tablet Take 1 tablet (10 mg total) by mouth at bedtime. 30 tablet 2  . mesalamine (LIALDA) 1.2 G EC tablet Take 2.4 g by mouth daily.    . montelukast (SINGULAIR) 10 MG tablet Take 10 mg by mouth at bedtime.     . promethazine (PHENERGAN) 25 MG tablet Take 1 tablet (25 mg total) by mouth every 6 (six) hours as needed for nausea or vomiting. 30 tablet 3  . triamcinolone  (NASACORT ALLERGY 24HR) 55 MCG/ACT AERO nasal inhaler Place 1 spray into the nose daily.    .Marland Kitchenvenlafaxine XR (EFFEXOR-XR) 75 MG 24 hr capsule TAKE ONE CAPSULE BY MOUTH EVERY DAY WITH BREAKFAST 90 capsule 1  . zolpidem (AMBIEN) 5 MG tablet TAKE 1 TABLET BY MOUTH EVERY DAY AT BEDTIME AS NEEDED 30 tablet 2  . Diphenhyd-Hydrocort-Nystatin (FIRST-DUKES MOUTHWASH) SUSP Use as directed 5 mLs in the mouth or throat daily as needed. Swish and spit (Patient not taking: Reported on 12/04/2014) 1 Bottle 0   No current facility-administered medications on file prior to visit.    Allergies  Allergen Reactions  . Ace Inhibitors Other (See Comments)    Angina  . Cefaclor   . Lisinopril Cough  . Losartan Potassium   . Sulfonamide Derivatives     Family History  Problem Relation Age of Onset  . Arthritis Mother 510   Deceased  . Breast cancer Mother   . Lung cancer Mother   . Hyperlipidemia Mother   . Heart disease Mother   . Hypertension Mother   . Diabetes Mother   . Crohn's disease Mother   . Diabetes Maternal Grandfather   . Heart disease Maternal Grandfather   . Heart attack Maternal Grandfather   . Colitis Sister     Social History   Social History  .  Marital Status: Married    Spouse Name: N/A  . Number of Children: N/A  . Years of Education: N/A   Social History Main Topics  . Smoking status: Former Smoker -- 4.00 packs/day for 15 years    Types: Cigarettes    Quit date: 09/14/1993  . Smokeless tobacco: Never Used     Comment: Quit >20 yrs ago  . Alcohol Use: 1.8 oz/week    3 Glasses of wine per week  . Drug Use: No  . Sexual Activity: Not Asked   Other Topics Concern  . None   Social History Narrative    Review of Systems - See HPI.  All other ROS are negative.  BP 130/74 mmHg  Pulse 86  Temp(Src) 98.2 F (36.8 C) (Oral)  Ht 5' 3"  (1.6 m)  Wt 169 lb 6.4 oz (76.839 kg)  BMI 30.02 kg/m2  SpO2 96%  Physical Exam  Constitutional: She is oriented to person,  place, and time and well-developed, well-nourished, and in no distress.  HENT:  Head: Normocephalic and atraumatic.  L TM with air bubbles noted without retraction, erythema or purulent fluid noted.  Eyes: Conjunctivae are normal.  Neck: Neck supple.  Cardiovascular: Normal rate, regular rhythm, normal heart sounds and intact distal pulses.   Pulmonary/Chest: Effort normal and breath sounds normal. No respiratory distress. She has no wheezes. She has no rales. She exhibits no tenderness.  Neurological: She is alert and oriented to person, place, and time.  Skin: Skin is warm and dry. No rash noted.  Psychiatric: Affect normal.  Vitals reviewed.   Recent Results (from the past 2160 hour(s))  Allergy full profile     Status: Abnormal   Collection Time: 09/15/14 12:31 PM  Result Value Ref Range   Allergen, D pternoyssinus,d7 <0.10 kU/L   D. farinae <0.10 kU/L   Cat Dander <0.10 kU/L   Dog Dander <0.10 kU/L   Goose Feathers <0.10 kU/L   [place] Grass <0.10 kU/L   Fescue <0.10 kU/L   G005 Rye, Perennial <0.10 kU/L   Timothy Grass <0.10 kU/L   G009 Red Top <0.10 kU/L   [place] Grass <0.10 kU/L   [employer] <0.10 kU/L   Aspergillus fumigatus, m3 <0.10 kU/L   Alternaria Alternata <0.10 kU/L   Helminthosporium halodes <0.10 kU/L   Stemphylium Botryosum <0.10 kU/L   Candida Albicans <0.10 kU/L   Curvularia lunata <0.10 kU/L   [place] IgE <0.10 kU/L   Oak <0.10 kU/L   Elm IgE <0.10 kU/L   Sycamore Tree <0.10 kU/L   Common Ragweed 0.16 (H) kU/L   Plantain <0.10 kU/L   Lamb's Quarters <0.10 kU/L   Goldenrod <0.10 kU/L   IgE (Immunoglobulin E), Serum 129 (H) <115 kU/L    Comment:        ----------------------------------------------------      Class   Specific IgE (kU/L)   Level      ----------------------------------------------------      0       <0.10                 Absent or undetectable      0/1     0.10  -   0.34        Equivocal/Borderline      1       0.35  -    0.69        Low      2       0.70  -  3.49        Moderate      3       3.50  -  17.49        High      4       17.50 -  49.99        Very High      5       50.00 - 100.00        Very High      6       >100.00               Very High   CBC w/Diff     Status: Abnormal   Collection Time: 09/15/14 12:31 PM  Result Value Ref Range   WBC 8.5 4.0 - 10.5 K/uL   RBC 4.49 3.87 - 5.11 Mil/uL   Hemoglobin 13.3 12.0 - 15.0 g/dL   HCT 39.0 36.0 - 46.0 %   MCV 86.9 78.0 - 100.0 fl   MCHC 34.0 30.0 - 36.0 g/dL   RDW 13.9 11.5 - 15.5 %   Platelets 430.0 (H) 150.0 - 400.0 K/uL   Neutrophils Relative % 62.3 43.0 - 77.0 %   Lymphocytes Relative 27.0 12.0 - 46.0 %   Monocytes Relative 9.1 3.0 - 12.0 %   Eosinophils Relative 1.1 0.0 - 5.0 %   Basophils Relative 0.5 0.0 - 3.0 %   Neutro Abs 5.3 1.4 - 7.7 K/uL   Lymphs Abs 2.3 0.7 - 4.0 K/uL   Monocytes Absolute 0.8 0.1 - 1.0 K/uL   Eosinophils Absolute 0.1 0.0 - 0.7 K/uL   Basophils Absolute 0.0 0.0 - 0.1 K/uL  Basic Metabolic Panel (BMET)     Status: Abnormal   Collection Time: 09/15/14 12:31 PM  Result Value Ref Range   Sodium 139 135 - 145 mEq/L   Potassium 4.1 3.5 - 5.1 mEq/L   Chloride 103 96 - 112 mEq/L   CO2 28 19 - 32 mEq/L   Glucose, Bld 108 (H) 70 - 99 mg/dL   BUN 7 6 - 23 mg/dL   Creatinine, Ser 0.65 0.40 - 1.20 mg/dL   Calcium 10.0 8.4 - 10.5 mg/dL   GFR 101.98 >60.00 mL/min    Assessment/Plan: Elevated triglycerides with high cholesterol Discussed importance of dietary and exercise interventions. Will obtain fasting lipid panel today to assess effectiveness of medication combination. We'll alter her regimen based on results.  Hypothyroidism Repeat TSH level today.  Eustachian tube dysfunction Continue allergy medication regimen. Intramuscular Depo-Medrol given today by nursing staff. ENT referral if symptoms not improving.

## 2014-12-04 NOTE — Assessment & Plan Note (Signed)
Repeat TSH level today.

## 2014-12-04 NOTE — Assessment & Plan Note (Signed)
Discussed importance of dietary and exercise interventions. Will obtain fasting lipid panel today to assess effectiveness of medication combination. We'll alter her regimen based on results.

## 2014-12-31 ENCOUNTER — Telehealth: Payer: Self-pay | Admitting: Physician Assistant

## 2014-12-31 DIAGNOSIS — H6980 Other specified disorders of Eustachian tube, unspecified ear: Secondary | ICD-10-CM

## 2014-12-31 NOTE — Telephone Encounter (Signed)
Pt states that she needs referral to ENT as ears are not clearing up. Pt states Dr. Ernesto Rutherford years ago and Dr. Thornell Mule years ago.

## 2015-01-01 NOTE — Telephone Encounter (Signed)
Referral placed. She will be contacted to schedule an appointment.

## 2015-01-02 ENCOUNTER — Other Ambulatory Visit: Payer: Self-pay | Admitting: Physician Assistant

## 2015-01-04 NOTE — Telephone Encounter (Signed)
Rx faxed to pharmacy/[facility]

## 2015-01-19 ENCOUNTER — Ambulatory Visit (INDEPENDENT_AMBULATORY_CARE_PROVIDER_SITE_OTHER): Payer: 59 | Admitting: Physician Assistant

## 2015-01-19 ENCOUNTER — Encounter: Payer: Self-pay | Admitting: Physician Assistant

## 2015-01-19 ENCOUNTER — Ambulatory Visit (HOSPITAL_BASED_OUTPATIENT_CLINIC_OR_DEPARTMENT_OTHER)
Admission: RE | Admit: 2015-01-19 | Discharge: 2015-01-19 | Disposition: A | Discharge status: Home / Self Care | Payer: 59 | Source: Ambulatory Visit | Attending: Physician Assistant | Admitting: Physician Assistant

## 2015-01-19 VITALS — BP 125/82 | HR 94 | Temp 98.2°F | Resp 16 | Ht 63.0 in | Wt 169.2 lb

## 2015-01-19 DIAGNOSIS — J441 Chronic obstructive pulmonary disease with (acute) exacerbation: Secondary | ICD-10-CM | POA: Diagnosis not present

## 2015-01-19 DIAGNOSIS — R05 Cough: Secondary | ICD-10-CM | POA: Insufficient documentation

## 2015-01-19 DIAGNOSIS — J208 Acute bronchitis due to other specified organisms: Principal | ICD-10-CM

## 2015-01-19 DIAGNOSIS — J00 Acute nasopharyngitis [common cold]: Secondary | ICD-10-CM | POA: Diagnosis not present

## 2015-01-19 DIAGNOSIS — B9689 Other specified bacterial agents as the cause of diseases classified elsewhere: Secondary | ICD-10-CM

## 2015-01-19 MED ORDER — PREDNISONE 20 MG PO TABS: 40.0000 mg | ORAL_TABLET | Freq: Every day | ORAL | Status: DC

## 2015-01-19 MED ORDER — HYDROCOD POLST-CPM POLST ER 10-8 MG/5ML PO SUER: 5.0000 mL | Freq: Two times a day (BID) | ORAL | Status: DC | PRN

## 2015-01-19 MED ORDER — LEVOFLOXACIN 500 MG PO TABS: 500.0000 mg | ORAL_TABLET | Freq: Every day | ORAL | Status: DC

## 2015-01-19 NOTE — Progress Notes (Signed)
Patient presents to clinic today c/o chest congestion, productive cough, fatigue and chest tenderness. Denies chills, fever, wheezing. Has been using Xopenex, Singulair and Advair as directed. Denies recent travel.  Past Medical History  Diagnosis Date  . Asthma   . Depression   . Frequent headaches   . GERD (gastroesophageal reflux disease)   . Hay fever   . Environmental allergies   . Elevated blood pressure   . Hyperlipidemia   . Migraines   . Colon polyps   . Thyroid disease   . Chicken pox     Current Outpatient Prescriptions on File Prior to Visit  Medication Sig Dispense Refill  . acyclovir (ZOVIRAX) 400 MG tablet TAKE 1 TABLET BY MOUTH THREE TIMES DAILY AS NEEDED 30 tablet 5  . azelastine (ASTELIN) 0.1 % nasal spray 2 sprays 2 (two) times daily.  6  . BENICAR 40 MG tablet TAKE 1 TABLET BY MOUTH EVERY NIGHT AT BEDTIME 90 tablet 0  . fluticasone-salmeterol (ADVAIR HFA) 115-21 MCG/ACT inhaler Inhale 2 puffs into the lungs 2 (two) times daily.    Vanessa Kick Ethyl (VASCEPA) 1 G CAPS Take 1 tablet by mouth 2 (two) times daily. 180 capsule 1  . levalbuterol (XOPENEX HFA) 45 MCG/ACT inhaler Inhale 2 puffs into the lungs every 4 (four) hours as needed for wheezing (Q4-6 Hours PRN).    Marland Kitchen levothyroxine (SYNTHROID, LEVOTHROID) 112 MCG tablet Take 1 tablet (112 mcg total) by mouth daily. 90 tablet 1  . lovastatin (MEVACOR) 10 MG tablet Take 1 tablet (10 mg total) by mouth at bedtime. 30 tablet 5  . mesalamine (LIALDA) 1.2 G EC tablet Take 2.4 g by mouth daily.    . montelukast (SINGULAIR) 10 MG tablet Take 10 mg by mouth at bedtime.     . promethazine (PHENERGAN) 25 MG tablet Take 1 tablet (25 mg total) by mouth every 6 (six) hours as needed for nausea or vomiting. 30 tablet 3  . triamcinolone (NASACORT ALLERGY 24HR) 55 MCG/ACT AERO nasal inhaler Place 1 spray into the nose daily.    Marland Kitchen venlafaxine XR (EFFEXOR-XR) 75 MG 24 hr capsule TAKE ONE CAPSULE BY MOUTH EVERY DAY WITH BREAKFAST  90 capsule 1  . zolpidem (AMBIEN) 5 MG tablet TAKE 1 TABLET BY MOUTH EVERY DAY AT BEDTIME AS NEEDED 30 tablet 2   No current facility-administered medications on file prior to visit.    Allergies  Allergen Reactions  . Ace Inhibitors Other (See Comments)    Angina  . Cefaclor   . Lisinopril Cough  . Losartan Potassium   . Sulfonamide Derivatives     Family History  Problem Relation Age of Onset  . Arthritis Mother 5    Deceased  . Breast cancer Mother   . Lung cancer Mother   . Hyperlipidemia Mother   . Heart disease Mother   . Hypertension Mother   . Diabetes Mother   . Crohn's disease Mother   . Diabetes Maternal Grandfather   . Heart disease Maternal Grandfather   . Heart attack Maternal Grandfather   . Colitis Sister     Social History   Social History  . Marital Status: Married    Spouse Name: N/A  . Number of Children: N/A  . Years of Education: N/A   Social History Main Topics  . Smoking status: Former Smoker -- 4.00 packs/day for 15 years    Types: Cigarettes    Quit date: 09/14/1993  . Smokeless tobacco: Never Used  Comment: Quit >20 yrs ago  . Alcohol Use: 1.8 oz/week    3 Glasses of wine per week  . Drug Use: No  . Sexual Activity: Not Asked   Other Topics Concern  . None   Social History Narrative   Review of Systems - See HPI.  All other ROS are negative.  BP 125/82 mmHg  Pulse 94  Temp(Src) 98.2 F (36.8 C) (Oral)  Resp 16  Ht 5' 3"  (1.6 m)  Wt 169 lb 3 oz (76.743 kg)  BMI 29.98 kg/m2  SpO2 98%  Physical Exam  Constitutional: She is oriented to person, place, and time and well-developed, well-nourished, and in no distress.  HENT:  Head: Normocephalic and atraumatic.  Right Ear: External ear normal.  Left Ear: External ear normal.  Nose: Nose normal.  Mouth/Throat: Oropharynx is clear and moist.  TM within normal limits bilaterally.  Eyes: Conjunctivae are normal.  Cardiovascular: Normal rate, regular rhythm, normal heart  sounds and intact distal pulses.   Pulmonary/Chest: Effort normal. No respiratory distress. She has wheezes. She has no rales. She exhibits no tenderness.  Neurological: She is alert and oriented to person, place, and time.  Skin: Skin is warm and dry. No rash noted.  Psychiatric: Affect normal.  Vitals reviewed.   Recent Results (from the past 2160 hour(s))  Lipid Profile     Status: Abnormal   Collection Time: 12/04/14  7:31 AM  Result Value Ref Range   Cholesterol 178 0 - 200 mg/dL    Comment: ATP III Classification       Desirable:  < 200 mg/dL               Borderline High:  200 - 239 mg/dL          High:  > = 240 mg/dL   Triglycerides 144.0 0.0 - 149.0 mg/dL    Comment: Normal:  <150 mg/dLBorderline High:  150 - 199 mg/dL   HDL 35.60 (L) >39.00 mg/dL   VLDL 28.8 0.0 - 40.0 mg/dL   LDL Cholesterol 114 (H) 0 - 99 mg/dL   Total CHOL/HDL Ratio 5     Comment:                Men          Women1/2 Average Risk     3.4          3.3Average Risk          5.0          4.42X Average Risk          9.6          7.13X Average Risk          15.0          11.0                       NonHDL 142.58     Comment: NOTE:  Non-HDL goal should be 30 mg/dL higher than patient's LDL goal (i.e. LDL goal of < 70 mg/dL, would have non-HDL goal of < 100 mg/dL)  TSH     Status: Abnormal   Collection Time: 12/04/14  7:31 AM  Result Value Ref Range   TSH 0.13 (L) 0.35 - 4.50 uIU/mL    Assessment/Plan: COPD exacerbation (HCC) Continue chronic medications. Rx Prednisone 40 mg 5-day burst. Will obtain CXR today.  Acute bacterial bronchitis Rx Levaquin.  Increase fluids.  Rest.  Saline nasal spray.  Probiotic.  Mucinex as directed.  Humidifier in bedroom. Tussionex.  Steroid given for COPD exacerbation. CXR today.  Call or return to clinic if symptoms are not improving.

## 2015-01-19 NOTE — Progress Notes (Signed)
Pre visit review using our clinic review tool, if applicable. No additional management support is needed unless otherwise documented below in the visit note/SLS  

## 2015-01-19 NOTE — Patient Instructions (Signed)
Go downstairs for chest x-ray and I will call you with your results.  Take antibiotic (Levaquin) as directed.  Increase fluids.  Get plenty of rest. Use Mucinex for congestion. Take prednisone as directed.  Continue your chronic breathing medications. Take a daily probiotic (I recommend Align or Culturelle, but even Activia Yogurt may be beneficial).  A humidifier placed in the bedroom may offer some relief for a dry, scratchy throat of nasal irritation.  Read information below on acute bronchitis. Please call or return to clinic if symptoms are not improving.  Acute Bronchitis Bronchitis is when the airways that extend from the windpipe into the lungs get red, puffy, and painful (inflamed). Bronchitis often causes thick spit (mucus) to develop. This leads to a cough. A cough is the most common symptom of bronchitis. In acute bronchitis, the condition usually begins suddenly and goes away over time (usually in 2 weeks). Smoking, allergies, and asthma can make bronchitis worse. Repeated episodes of bronchitis may cause more lung problems.  HOME CARE  Rest.  Drink enough fluids to keep your pee (urine) clear or pale yellow (unless you need to limit fluids as told by your doctor).  Only take over-the-counter or prescription medicines as told by your doctor.  Avoid smoking and secondhand smoke. These can make bronchitis worse. If you are a smoker, think about using nicotine gum or skin patches. Quitting smoking will help your lungs heal faster.  Reduce the chance of getting bronchitis again by:  Washing your hands often.  Avoiding people with cold symptoms.  Trying not to touch your hands to your mouth, nose, or eyes.  Follow up with your doctor as told.  GET HELP IF: Your symptoms do not improve after 1 week of treatment. Symptoms include:  Cough.  Fever.  Coughing up thick spit.  Body aches.  Chest congestion.  Chills.  Shortness of breath.  Sore throat.  GET HELP RIGHT  AWAY IF:   You have an increased fever.  You have chills.  You have severe shortness of breath.  You have bloody thick spit (sputum).  You throw up (vomit) often.  You lose too much body fluid (dehydration).  You have a severe headache.  You faint.  MAKE SURE YOU:   Understand these instructions.  Will watch your condition.  Will get help right away if you are not doing well or get worse. Document Released: 09/20/2007 Document Revised: 12/04/2012 Document Reviewed: 09/24/2012 [facility] Patient Information 2015 [place], [place]. This information is not intended to replace advice given to you by your health care provider. Make sure you discuss any questions you have with your health care provider.

## 2015-01-19 NOTE — Assessment & Plan Note (Signed)
Rx Levaquin.  Increase fluids.  Rest.  Saline nasal spray.  Probiotic.  Mucinex as directed.  Humidifier in bedroom. Tussionex.  Steroid given for COPD exacerbation. CXR today.  Call or return to clinic if symptoms are not improving.

## 2015-01-19 NOTE — Assessment & Plan Note (Signed)
Continue chronic medications. Rx Prednisone 40 mg 5-day burst. Will obtain CXR today.

## 2015-01-26 ENCOUNTER — Encounter: Payer: Self-pay | Admitting: Physician Assistant

## 2015-01-26 ENCOUNTER — Telehealth: Payer: Self-pay | Admitting: Physician Assistant

## 2015-01-26 ENCOUNTER — Ambulatory Visit (INDEPENDENT_AMBULATORY_CARE_PROVIDER_SITE_OTHER): Payer: 59 | Admitting: Physician Assistant

## 2015-01-26 VITALS — BP 118/80 | HR 91 | Temp 98.3°F | Resp 16 | Ht 63.0 in | Wt 176.4 lb

## 2015-01-26 DIAGNOSIS — J329 Chronic sinusitis, unspecified: Secondary | ICD-10-CM

## 2015-01-26 DIAGNOSIS — J441 Chronic obstructive pulmonary disease with (acute) exacerbation: Secondary | ICD-10-CM

## 2015-01-26 DIAGNOSIS — J320 Chronic maxillary sinusitis: Secondary | ICD-10-CM | POA: Insufficient documentation

## 2015-01-26 DIAGNOSIS — J019 Acute sinusitis, unspecified: Secondary | ICD-10-CM

## 2015-01-26 DIAGNOSIS — B9689 Other specified bacterial agents as the cause of diseases classified elsewhere: Secondary | ICD-10-CM

## 2015-01-26 HISTORY — DX: Chronic sinusitis, unspecified: J32.9

## 2015-01-26 NOTE — Telephone Encounter (Signed)
She will need follow-up appointment. Do not refill antibiotics.

## 2015-01-26 NOTE — Assessment & Plan Note (Signed)
Rx Doxycycline.  Increase fluids.  Rest.  Saline nasal spray.  Probiotic.  Mucinex as directed.  Humidifier in bedroom. Continue other medications as directed.  Call or return to clinic if symptoms are not improving.

## 2015-01-26 NOTE — Telephone Encounter (Signed)
lvm advising patient of message below

## 2015-01-26 NOTE — Progress Notes (Signed)
Patient presents to clinic today c/o continued sinus pressure, sinus pain, chest congestion and fatigue despite treatment with prednisone burst and Levaquin, which she endorses taking as directed. CXR obtained at last visit negative for acute process. Endorses increased sinus pressure, sinus pain and ear pain now. Denies fever. Breathing much improved after steroid pack.   Past Medical History  Diagnosis Date  . Asthma   . Depression   . Frequent headaches   . GERD (gastroesophageal reflux disease)   . Hay fever   . Environmental allergies   . Elevated blood pressure   . Hyperlipidemia   . Migraines   . Colon polyps   . Thyroid disease   . Chicken pox     Current Outpatient Prescriptions on File Prior to Visit  Medication Sig Dispense Refill  . acyclovir (ZOVIRAX) 400 MG tablet TAKE 1 TABLET BY MOUTH THREE TIMES DAILY AS NEEDED 30 tablet 5  . azelastine (ASTELIN) 0.1 % nasal spray 2 sprays 2 (two) times daily.  6  . BENICAR 40 MG tablet TAKE 1 TABLET BY MOUTH EVERY NIGHT AT BEDTIME 90 tablet 0  . chlorpheniramine-HYDROcodone (TUSSIONEX PENNKINETIC ER) 10-8 MG/5ML SUER Take 5 mLs by mouth every 12 (twelve) hours as needed for cough. 140 mL 0  . fluticasone-salmeterol (ADVAIR HFA) 115-21 MCG/ACT inhaler Inhale 2 puffs into the lungs 2 (two) times daily.    Vanessa Kick Ethyl (VASCEPA) 1 G CAPS Take 1 tablet by mouth 2 (two) times daily. 180 capsule 1  . levalbuterol (XOPENEX HFA) 45 MCG/ACT inhaler Inhale 2 puffs into the lungs every 4 (four) hours as needed for wheezing (Q4-6 Hours PRN).    Marland Kitchen levothyroxine (SYNTHROID, LEVOTHROID) 112 MCG tablet Take 1 tablet (112 mcg total) by mouth daily. 90 tablet 1  . lovastatin (MEVACOR) 10 MG tablet Take 1 tablet (10 mg total) by mouth at bedtime. 30 tablet 5  . mesalamine (LIALDA) 1.2 G EC tablet Take 2.4 g by mouth daily.    . montelukast (SINGULAIR) 10 MG tablet Take 10 mg by mouth at bedtime.     . promethazine (PHENERGAN) 25 MG tablet  Take 1 tablet (25 mg total) by mouth every 6 (six) hours as needed for nausea or vomiting. 30 tablet 3  . triamcinolone (NASACORT ALLERGY 24HR) 55 MCG/ACT AERO nasal inhaler Place 1 spray into the nose daily.    Marland Kitchen venlafaxine XR (EFFEXOR-XR) 75 MG 24 hr capsule TAKE ONE CAPSULE BY MOUTH EVERY DAY WITH BREAKFAST 90 capsule 1  . zolpidem (AMBIEN) 5 MG tablet TAKE 1 TABLET BY MOUTH EVERY DAY AT BEDTIME AS NEEDED 30 tablet 2   No current facility-administered medications on file prior to visit.    Allergies  Allergen Reactions  . Ace Inhibitors Other (See Comments)    Angina  . Cefaclor   . Lisinopril Cough  . Losartan Potassium   . Sulfonamide Derivatives     Family History  Problem Relation Age of Onset  . Arthritis Mother 76    Deceased  . Breast cancer Mother   . Lung cancer Mother   . Hyperlipidemia Mother   . Heart disease Mother   . Hypertension Mother   . Diabetes Mother   . Crohn's disease Mother   . Diabetes Maternal Grandfather   . Heart disease Maternal Grandfather   . Heart attack Maternal Grandfather   . Colitis Sister     Social History   Social History  . Marital Status: Married    Spouse  Name: N/A  . Number of Children: N/A  . Years of Education: N/A   Social History Main Topics  . Smoking status: Former Smoker -- 4.00 packs/day for 15 years    Types: Cigarettes    Quit date: 09/14/1993  . Smokeless tobacco: Never Used     Comment: Quit >20 yrs ago  . Alcohol Use: 1.8 oz/week    3 Glasses of wine per week  . Drug Use: No  . Sexual Activity: Not on file   Other Topics Concern  . Not on file   Social History Narrative    Review of Systems - See HPI.  All other ROS are negative.  BP 118/80 mmHg  Pulse 91  Temp(Src) 98.3 F (36.8 C) (Oral)  Resp 16  Ht 5' 3"  (1.6 m)  Wt 176 lb 6 oz (80.003 kg)  BMI 31.25 kg/m2  SpO2 95%  Physical Exam  Constitutional: She is oriented to person, place, and time and well-developed, well-nourished, and in  no distress.  HENT:  Head: Normocephalic and atraumatic.  Eyes: Conjunctivae are normal.  Neck: Neck supple.  Cardiovascular: Normal rate, regular rhythm, normal heart sounds and intact distal pulses.   Pulmonary/Chest: Effort normal and breath sounds normal. No respiratory distress. She has no wheezes. She has no rales. She exhibits no tenderness.  Neurological: She is alert and oriented to person, place, and time.  Skin: Skin is warm and dry. No rash noted.  Psychiatric: Affect normal.  Vitals reviewed.   Recent Results (from the past 2160 hour(s))  Lipid Profile     Status: Abnormal   Collection Time: 12/04/14  7:31 AM  Result Value Ref Range   Cholesterol 178 0 - 200 mg/dL    Comment: ATP III Classification       Desirable:  < 200 mg/dL               Borderline High:  200 - 239 mg/dL          High:  > = 240 mg/dL   Triglycerides 144.0 0.0 - 149.0 mg/dL    Comment: Normal:  <150 mg/dLBorderline High:  150 - 199 mg/dL   HDL 35.60 (L) >39.00 mg/dL   VLDL 28.8 0.0 - 40.0 mg/dL   LDL Cholesterol 114 (H) 0 - 99 mg/dL   Total CHOL/HDL Ratio 5     Comment:                Men          Women1/2 Average Risk     3.4          3.3Average Risk          5.0          4.42X Average Risk          9.6          7.13X Average Risk          15.0          11.0                       NonHDL 142.58     Comment: NOTE:  Non-HDL goal should be 30 mg/dL higher than patient's LDL goal (i.e. LDL goal of < 70 mg/dL, would have non-HDL goal of < 100 mg/dL)  TSH     Status: Abnormal   Collection Time: 12/04/14  7:31 AM  Result Value Ref Range   TSH 0.13 (L) 0.35 -  4.50 uIU/mL    Assessment/Plan: COPD exacerbation (HCC) Resolving with steroids. Breathing better today. Continue Advair but resume Xopenex every 6 hours.  Acute bacterial sinusitis Rx Doxycycline.  Increase fluids.  Rest.  Saline nasal spray.  Probiotic.  Mucinex as directed.  Humidifier in bedroom. Continue other medications as directed.  Call or  return to clinic if symptoms are not improving.

## 2015-01-26 NOTE — Patient Instructions (Signed)
Please take antibiotic as directed.  Increase fluid intake.  Use Saline nasal spray.  Take a daily multivitamin. Continue Advair and Xopenex as well as Tussionex.  Place a humidifier in the bedroom.  Please call or return clinic if symptoms are not improving.  Sinusitis Sinusitis is redness, soreness, and swelling (inflammation) of the paranasal sinuses. Paranasal sinuses are air pockets within the bones of your face (beneath the eyes, the middle of the forehead, or above the eyes). In healthy paranasal sinuses, mucus is able to drain out, and air is able to circulate through them by way of your nose. However, when your paranasal sinuses are inflamed, mucus and air can become trapped. This can allow bacteria and other germs to grow and cause infection. Sinusitis can develop quickly and last only a short time (acute) or continue over a long period (chronic). Sinusitis that lasts for more than 12 weeks is considered chronic.  CAUSES  Causes of sinusitis include:  Allergies.  Structural abnormalities, such as displacement of the cartilage that separates your nostrils (deviated septum), which can decrease the air flow through your nose and sinuses and affect sinus drainage.  Functional abnormalities, such as when the small hairs (cilia) that line your sinuses and help remove mucus do not work properly or are not present. SYMPTOMS  Symptoms of acute and chronic sinusitis are the same. The primary symptoms are pain and pressure around the affected sinuses. Other symptoms include:  Upper toothache.  Earache.  Headache.  Bad breath.  Decreased sense of smell and taste.  A cough, which worsens when you are lying flat.  Fatigue.  Fever.  Thick drainage from your nose, which often is green and may contain pus (purulent).  Swelling and warmth over the affected sinuses. DIAGNOSIS  Your caregiver will perform a physical exam. During the exam, your caregiver may:  Look in your nose for signs  of abnormal growths in your nostrils (nasal polyps).  Tap over the affected sinus to check for signs of infection.  View the inside of your sinuses (endoscopy) with a special imaging device with a light attached (endoscope), which is inserted into your sinuses. If your caregiver suspects that you have chronic sinusitis, one or more of the following tests may be recommended:  Allergy tests.  Nasal culture A sample of mucus is taken from your nose and sent to a lab and screened for bacteria.  Nasal cytology A sample of mucus is taken from your nose and examined by your caregiver to determine if your sinusitis is related to an allergy. TREATMENT  Most cases of acute sinusitis are related to a viral infection and will resolve on their own within 10 days. Sometimes medicines are prescribed to help relieve symptoms (pain medicine, decongestants, nasal steroid sprays, or saline sprays).  However, for sinusitis related to a bacterial infection, your caregiver will prescribe antibiotic medicines. These are medicines that will help kill the bacteria causing the infection.  Rarely, sinusitis is caused by a fungal infection. In theses cases, your caregiver will prescribe antifungal medicine. For some cases of chronic sinusitis, surgery is needed. Generally, these are cases in which sinusitis recurs more than 3 times per year, despite other treatments. HOME CARE INSTRUCTIONS   Drink plenty of water. Water helps thin the mucus so your sinuses can drain more easily.  Use a humidifier.  Inhale steam 3 to 4 times a day (for example, sit in the bathroom with the shower running).  Apply a warm, moist washcloth to  your face 3 to 4 times a day, or as directed by your caregiver.  Use saline nasal sprays to help moisten and clean your sinuses.  Take over-the-counter or prescription medicines for pain, discomfort, or fever only as directed by your caregiver. SEEK IMMEDIATE MEDICAL CARE IF:  You have  increasing pain or severe headaches.  You have nausea, vomiting, or drowsiness.  You have swelling around your face.  You have vision problems.  You have a stiff neck.  You have difficulty breathing. MAKE SURE YOU:   Understand these instructions.  Will watch your condition.  Will get help right away if you are not doing well or get worse. Document Released: 04/03/2005 Document Revised: 06/26/2011 Document Reviewed: 04/18/2011 [facility] Patient Information 2014 [place], [place].

## 2015-01-26 NOTE — Assessment & Plan Note (Signed)
Resolving with steroids. Breathing better today. Continue Advair but resume Xopenex every 6 hours.

## 2015-01-26 NOTE — Progress Notes (Signed)
Pre visit review using our clinic review tool, if applicable. No additional management support is needed unless otherwise documented below in the visit note/SLS  

## 2015-01-26 NOTE — Telephone Encounter (Signed)
Relation to AS:NKNL Call back number: [phone] Pharmacy: [id number] - [place], [place] - [address] AT [phone] (Phone) [phone] (Fax)         Reason for call:  Patient requesting antibiotics refill (patient does not know the name of antibiotics)

## 2015-01-27 ENCOUNTER — Encounter: Payer: Self-pay | Admitting: Physician Assistant

## 2015-01-27 MED ORDER — DOXYCYCLINE HYCLATE 100 MG PO CAPS: 100.0000 mg | ORAL_CAPSULE | Freq: Two times a day (BID) | ORAL | Status: DC

## 2015-02-02 ENCOUNTER — Other Ambulatory Visit: Payer: Self-pay | Admitting: Physician Assistant

## 2015-03-30 ENCOUNTER — Telehealth: Payer: Self-pay | Admitting: Physician Assistant

## 2015-03-30 NOTE — Telephone Encounter (Signed)
LM for pt to call and schedule flu shot or update records. °

## 2015-04-05 ENCOUNTER — Other Ambulatory Visit: Payer: Self-pay | Admitting: Physician Assistant

## 2015-04-12 ENCOUNTER — Other Ambulatory Visit: Payer: Self-pay | Admitting: Physician Assistant

## 2015-04-13 NOTE — Telephone Encounter (Signed)
Rx sent to the pharmacy by e-script.//AB/CMA 

## 2015-04-14 ENCOUNTER — Other Ambulatory Visit: Payer: Self-pay | Admitting: Physician Assistant

## 2015-04-15 NOTE — Telephone Encounter (Signed)
Okay #30 and one refill

## 2015-04-15 NOTE — Telephone Encounter (Signed)
Rx sent 

## 2015-04-15 NOTE — Telephone Encounter (Signed)
Pt is requesting refill on Phenergan. Uses for UC symptoms. [facility].   Last OV: 01/26/2015 Last Fill: 08/20/2014 #30 and 3RF    Please advise.

## 2015-06-07 ENCOUNTER — Encounter: Payer: Self-pay | Admitting: Physician Assistant

## 2015-06-07 ENCOUNTER — Other Ambulatory Visit: Payer: Self-pay | Admitting: Physician Assistant

## 2015-06-08 ENCOUNTER — Encounter: Payer: Self-pay | Admitting: Physician Assistant

## 2015-06-15 ENCOUNTER — Other Ambulatory Visit: Payer: Self-pay | Admitting: Physician Assistant

## 2015-06-17 ENCOUNTER — Ambulatory Visit (INDEPENDENT_AMBULATORY_CARE_PROVIDER_SITE_OTHER): Payer: 59 | Admitting: Licensed Clinical Social Worker

## 2015-06-17 DIAGNOSIS — F331 Major depressive disorder, recurrent, moderate: Secondary | ICD-10-CM | POA: Diagnosis not present

## 2015-07-01 ENCOUNTER — Ambulatory Visit (INDEPENDENT_AMBULATORY_CARE_PROVIDER_SITE_OTHER): Payer: 59 | Admitting: Licensed Clinical Social Worker

## 2015-07-01 DIAGNOSIS — F331 Major depressive disorder, recurrent, moderate: Secondary | ICD-10-CM | POA: Diagnosis not present

## 2015-07-08 ENCOUNTER — Ambulatory Visit (INDEPENDENT_AMBULATORY_CARE_PROVIDER_SITE_OTHER): Payer: 59 | Admitting: Licensed Clinical Social Worker

## 2015-07-08 DIAGNOSIS — F331 Major depressive disorder, recurrent, moderate: Secondary | ICD-10-CM | POA: Diagnosis not present

## 2015-07-27 ENCOUNTER — Ambulatory Visit (INDEPENDENT_AMBULATORY_CARE_PROVIDER_SITE_OTHER): Payer: 59 | Admitting: Physician Assistant

## 2015-07-27 ENCOUNTER — Encounter: Payer: Self-pay | Admitting: Physician Assistant

## 2015-07-27 VITALS — BP 110/70 | HR 102 | Temp 98.4°F | Ht 63.0 in | Wt 179.8 lb

## 2015-07-27 DIAGNOSIS — B9689 Other specified bacterial agents as the cause of diseases classified elsewhere: Secondary | ICD-10-CM

## 2015-07-27 DIAGNOSIS — J019 Acute sinusitis, unspecified: Secondary | ICD-10-CM | POA: Diagnosis not present

## 2015-07-27 MED ORDER — AMOXICILLIN-POT CLAVULANATE 875-125 MG PO TABS: 1.0000 | ORAL_TABLET | Freq: Two times a day (BID) | ORAL | Status: DC

## 2015-07-27 MED FILL — AMOX-CLAV 875-125 MG TABLET: 875-125 | 10 days supply | Qty: 20 | Fill #0

## 2015-07-27 NOTE — Progress Notes (Signed)
Pre visit review using our clinic review tool, if applicable. No additional management support is needed unless otherwise documented below in the visit note. 

## 2015-07-27 NOTE — Patient Instructions (Signed)
Please take antibiotic as directed.  Increase fluid intake.  Use Saline nasal spray.  Take a daily multivitamin. Continue allergy medications.  Place a humidifier in the bedroom.  Please call or return clinic if symptoms are not improving.  Sinusitis Sinusitis is redness, soreness, and swelling (inflammation) of the paranasal sinuses. Paranasal sinuses are air pockets within the bones of your face (beneath the eyes, the middle of the forehead, or above the eyes). In healthy paranasal sinuses, mucus is able to drain out, and air is able to circulate through them by way of your nose. However, when your paranasal sinuses are inflamed, mucus and air can become trapped. This can allow bacteria and other germs to grow and cause infection. Sinusitis can develop quickly and last only a short time (acute) or continue over a long period (chronic). Sinusitis that lasts for more than 12 weeks is considered chronic.  CAUSES  Causes of sinusitis include:  Allergies.  Structural abnormalities, such as displacement of the cartilage that separates your nostrils (deviated septum), which can decrease the air flow through your nose and sinuses and affect sinus drainage.  Functional abnormalities, such as when the small hairs (cilia) that line your sinuses and help remove mucus do not work properly or are not present. SYMPTOMS  Symptoms of acute and chronic sinusitis are the same. The primary symptoms are pain and pressure around the affected sinuses. Other symptoms include:  Upper toothache.  Earache.  Headache.  Bad breath.  Decreased sense of smell and taste.  A cough, which worsens when you are lying flat.  Fatigue.  Fever.  Thick drainage from your nose, which often is green and may contain pus (purulent).  Swelling and warmth over the affected sinuses. DIAGNOSIS  Your caregiver will perform a physical exam. During the exam, your caregiver may:  Look in your nose for signs of abnormal growths  in your nostrils (nasal polyps).  Tap over the affected sinus to check for signs of infection.  View the inside of your sinuses (endoscopy) with a special imaging device with a light attached (endoscope), which is inserted into your sinuses. If your caregiver suspects that you have chronic sinusitis, one or more of the following tests may be recommended:  Allergy tests.  Nasal culture A sample of mucus is taken from your nose and sent to a lab and screened for bacteria.  Nasal cytology A sample of mucus is taken from your nose and examined by your caregiver to determine if your sinusitis is related to an allergy. TREATMENT  Most cases of acute sinusitis are related to a viral infection and will resolve on their own within 10 days. Sometimes medicines are prescribed to help relieve symptoms (pain medicine, decongestants, nasal steroid sprays, or saline sprays).  However, for sinusitis related to a bacterial infection, your caregiver will prescribe antibiotic medicines. These are medicines that will help kill the bacteria causing the infection.  Rarely, sinusitis is caused by a fungal infection. In theses cases, your caregiver will prescribe antifungal medicine. For some cases of chronic sinusitis, surgery is needed. Generally, these are cases in which sinusitis recurs more than 3 times per year, despite other treatments. HOME CARE INSTRUCTIONS   Drink plenty of water. Water helps thin the mucus so your sinuses can drain more easily.  Use a humidifier.  Inhale steam 3 to 4 times a day (for example, sit in the bathroom with the shower running).  Apply a warm, moist washcloth to your face 3 to 4  times a day, or as directed by your caregiver.  Use saline nasal sprays to help moisten and clean your sinuses.  Take over-the-counter or prescription medicines for pain, discomfort, or fever only as directed by your caregiver. SEEK IMMEDIATE MEDICAL CARE IF:  You have increasing pain or severe  headaches.  You have nausea, vomiting, or drowsiness.  You have swelling around your face.  You have vision problems.  You have a stiff neck.  You have difficulty breathing. MAKE SURE YOU:   Understand these instructions.  Will watch your condition.  Will get help right away if you are not doing well or get worse. Document Released: 04/03/2005 Document Revised: 06/26/2011 Document Reviewed: 04/18/2011 [facility] Patient Information 2014 [place], [place].

## 2015-07-27 NOTE — Assessment & Plan Note (Signed)
Rx Augmentin.  Increase fluids.  Rest.  Saline nasal spray.  Probiotic.  Mucinex as directed.  Humidifier in bedroom. Continue chronic medications.  Call or return to clinic if symptoms are not improving.

## 2015-07-27 NOTE — Progress Notes (Signed)
Patient presents to clinic today c/o 1 weeks of recurrent sinus pressure, sinus pain, tooth pain, cough and fatigue. Was recently on Azithromycin for sinusitis, given by another provider. Denies resolution of symptoms. Symptoms have been present for 3 weeks. Denies chest congestion, chest pain or SOB.  Past Medical History  Diagnosis Date  . Asthma   . Depression   . Frequent headaches   . GERD (gastroesophageal reflux disease)   . Hay fever   . Environmental allergies   . Elevated blood pressure   . Hyperlipidemia   . Migraines   . Colon polyps   . Thyroid disease   . Chicken pox     Current Outpatient Prescriptions on File Prior to Visit  Medication Sig Dispense Refill  . acyclovir (ZOVIRAX) 400 MG tablet TAKE 1 TABLET BY MOUTH THREE TIMES DAILY AS NEEDED 30 tablet 5  . azelastine (ASTELIN) 0.1 % nasal spray 2 sprays 2 (two) times daily.  6  . levalbuterol (XOPENEX HFA) 45 MCG/ACT inhaler Inhale 2 puffs into the lungs every 4 (four) hours as needed for wheezing (Q4-6 Hours PRN).    Marland Kitchen levothyroxine (SYNTHROID, LEVOTHROID) 112 MCG tablet TAKE 1 TABLET(112 MCG) BY MOUTH DAILY 90 tablet 0  . lovastatin (MEVACOR) 10 MG tablet Take 1 tablet (10 mg total) by mouth at bedtime. 30 tablet 5  . mesalamine (LIALDA) 1.2 G EC tablet Take 2.4 g by mouth daily.    . montelukast (SINGULAIR) 10 MG tablet Take 10 mg by mouth at bedtime.     Marland Kitchen olmesartan (BENICAR) 40 MG tablet TAKE 1 TABLET BY MOUTH EVERY NIGHT AT BEDTIME 90 tablet 1  . promethazine (PHENERGAN) 25 MG tablet Take 1 tablet (25 mg total) by mouth every 6 (six) hours as needed for nausea or vomiting. 30 tablet 1  . triamcinolone (NASACORT ALLERGY 24HR) 55 MCG/ACT AERO nasal inhaler Place 1 spray into the nose daily.    Marland Kitchen venlafaxine XR (EFFEXOR-XR) 75 MG 24 hr capsule TAKE 1 CAPSULE BY MOUTH EVERY DAY WITH BREAKFAST 90 capsule 1  . zolpidem (AMBIEN) 5 MG tablet TAKE 1 TABLET BY MOUTH AT BEDTIME AS NEEDED 30 tablet 3   No current  facility-administered medications on file prior to visit.    Allergies  Allergen Reactions  . Ace Inhibitors Other (See Comments)    Angina  . Cefaclor   . Lisinopril Cough  . Losartan Potassium   . Sulfonamide Derivatives     Family History  Problem Relation Age of Onset  . Arthritis Mother 47    Deceased  . Breast cancer Mother   . Lung cancer Mother   . Hyperlipidemia Mother   . Heart disease Mother   . Hypertension Mother   . Diabetes Mother   . Crohn's disease Mother   . Diabetes Maternal Grandfather   . Heart disease Maternal Grandfather   . Heart attack Maternal Grandfather   . Colitis Sister     Social History   Social History  . Marital Status: Married    Spouse Name: N/A  . Number of Children: N/A  . Years of Education: N/A   Social History Main Topics  . Smoking status: Former Smoker -- 4.00 packs/day for 15 years    Types: Cigarettes    Quit date: 09/14/1993  . Smokeless tobacco: Never Used     Comment: Quit >20 yrs ago  . Alcohol Use: 1.8 oz/week    3 Glasses of wine per week  . Drug Use:  No  . Sexual Activity: Not Asked   Other Topics Concern  . None   Social History Narrative   Review of Systems - See HPI.  All other ROS are negative.  BP 110/70 mmHg  Pulse 102  Temp(Src) 98.4 F (36.9 C) (Oral)  Ht 5' 3"  (1.6 m)  Wt 179 lb 12.8 oz (81.557 kg)  BMI 31.86 kg/m2  SpO2 97%  Physical Exam  Constitutional: She is oriented to person, place, and time and well-developed, well-nourished, and in no distress.  HENT:  Head: Normocephalic and atraumatic.  Right Ear: Tympanic membrane normal.  Left Ear: Tympanic membrane normal.  Nose: Mucosal edema and rhinorrhea present. Right sinus exhibits frontal sinus tenderness. Left sinus exhibits frontal sinus tenderness.  Mouth/Throat: Uvula is midline, oropharynx is clear and moist and mucous membranes are normal.  Eyes: Conjunctivae are normal.  Neck: Neck supple.  Cardiovascular: Normal rate,  regular rhythm, normal heart sounds and intact distal pulses.   Pulmonary/Chest: Effort normal and breath sounds normal. No respiratory distress. She has no wheezes. She has no rales. She exhibits no tenderness.  Neurological: She is alert and oriented to person, place, and time.  Skin: Skin is warm and dry. No rash noted.  Psychiatric: Affect normal.  Vitals reviewed.  No results found for this or any previous visit (from the past 2160 hour(s)).  Assessment/Plan: Acute bacterial sinusitis Rx Augmentin.  Increase fluids.  Rest.  Saline nasal spray.  Probiotic.  Mucinex as directed.  Humidifier in bedroom. Continue chronic medications.  Call or return to clinic if symptoms are not improving.

## 2015-08-05 ENCOUNTER — Encounter: Payer: Self-pay | Admitting: Physician Assistant

## 2015-08-08 ENCOUNTER — Other Ambulatory Visit: Payer: Self-pay | Admitting: Physician Assistant

## 2015-08-10 ENCOUNTER — Ambulatory Visit (INDEPENDENT_AMBULATORY_CARE_PROVIDER_SITE_OTHER): Payer: 59 | Admitting: Physician Assistant

## 2015-08-10 ENCOUNTER — Encounter: Payer: Self-pay | Admitting: Physician Assistant

## 2015-08-10 VITALS — BP 118/70 | HR 98 | Temp 98.7°F | Ht 63.0 in | Wt 180.0 lb

## 2015-08-10 DIAGNOSIS — J329 Chronic sinusitis, unspecified: Secondary | ICD-10-CM

## 2015-08-10 MED ORDER — METHYLPREDNISOLONE ACETATE 80 MG/ML IJ SUSP
80.0000 mg | Freq: Once | INTRAMUSCULAR | Status: AC
  Administered 2015-08-10: 80 mg via INTRAMUSCULAR

## 2015-08-10 MED ORDER — HYDROCOD POLST-CPM POLST ER 10-8 MG/5ML PO SUER: 5.0000 mL | Freq: Two times a day (BID) | ORAL | Status: DC | PRN

## 2015-08-10 MED ORDER — LEVOFLOXACIN 750 MG PO TABS: 750.0000 mg | ORAL_TABLET | Freq: Every day | ORAL | Status: DC

## 2015-08-10 MED FILL — levoFLOXacin 750 MG TABS: 750 | 10 days supply | Qty: 10 | Fill #0

## 2015-08-10 MED FILL — HYDROCODONE-CHLORPHENIRAM S: 10-8 | 14 days supply | Qty: 140 | Fill #0

## 2015-08-10 NOTE — Addendum Note (Signed)
Addended by: Harl Bowie on: 08/10/2015 02:31 PM   Modules accepted: Orders

## 2015-08-10 NOTE — Progress Notes (Signed)
Pre visit review using our clinic review tool, if applicable. No additional management support is needed unless otherwise documented below in the visit note. 

## 2015-08-10 NOTE — Patient Instructions (Signed)
Please take antibiotic as directed. Tussionex for cough. Continue chronic medications  Increase fluid intake.  Use Saline nasal spray.  Take a daily multivitamin. The steroid shot given should help with swelling of the sinuses.  Place a humidifier in the bedroom.  Please call or return clinic if symptoms are not improving.  Sinusitis Sinusitis is redness, soreness, and swelling (inflammation) of the paranasal sinuses. Paranasal sinuses are air pockets within the bones of your face (beneath the eyes, the middle of the forehead, or above the eyes). In healthy paranasal sinuses, mucus is able to drain out, and air is able to circulate through them by way of your nose. However, when your paranasal sinuses are inflamed, mucus and air can become trapped. This can allow bacteria and other germs to grow and cause infection. Sinusitis can develop quickly and last only a short time (acute) or continue over a long period (chronic). Sinusitis that lasts for more than 12 weeks is considered chronic.  CAUSES  Causes of sinusitis include:  Allergies.  Structural abnormalities, such as displacement of the cartilage that separates your nostrils (deviated septum), which can decrease the air flow through your nose and sinuses and affect sinus drainage.  Functional abnormalities, such as when the small hairs (cilia) that line your sinuses and help remove mucus do not work properly or are not present. SYMPTOMS  Symptoms of acute and chronic sinusitis are the same. The primary symptoms are pain and pressure around the affected sinuses. Other symptoms include:  Upper toothache.  Earache.  Headache.  Bad breath.  Decreased sense of smell and taste.  A cough, which worsens when you are lying flat.  Fatigue.  Fever.  Thick drainage from your nose, which often is green and may contain pus (purulent).  Swelling and warmth over the affected sinuses. DIAGNOSIS  Your caregiver will perform a physical exam.  During the exam, your caregiver may:  Look in your nose for signs of abnormal growths in your nostrils (nasal polyps).  Tap over the affected sinus to check for signs of infection.  View the inside of your sinuses (endoscopy) with a special imaging device with a light attached (endoscope), which is inserted into your sinuses. If your caregiver suspects that you have chronic sinusitis, one or more of the following tests may be recommended:  Allergy tests.  Nasal culture A sample of mucus is taken from your nose and sent to a lab and screened for bacteria.  Nasal cytology A sample of mucus is taken from your nose and examined by your caregiver to determine if your sinusitis is related to an allergy. TREATMENT  Most cases of acute sinusitis are related to a viral infection and will resolve on their own within 10 days. Sometimes medicines are prescribed to help relieve symptoms (pain medicine, decongestants, nasal steroid sprays, or saline sprays).  However, for sinusitis related to a bacterial infection, your caregiver will prescribe antibiotic medicines. These are medicines that will help kill the bacteria causing the infection.  Rarely, sinusitis is caused by a fungal infection. In theses cases, your caregiver will prescribe antifungal medicine. For some cases of chronic sinusitis, surgery is needed. Generally, these are cases in which sinusitis recurs more than 3 times per year, despite other treatments. HOME CARE INSTRUCTIONS   Drink plenty of water. Water helps thin the mucus so your sinuses can drain more easily.  Use a humidifier.  Inhale steam 3 to 4 times a day (for example, sit in the bathroom with the  shower running).  Apply a warm, moist washcloth to your face 3 to 4 times a day, or as directed by your caregiver.  Use saline nasal sprays to help moisten and clean your sinuses.  Take over-the-counter or prescription medicines for pain, discomfort, or fever only as directed by  your caregiver. SEEK IMMEDIATE MEDICAL CARE IF:  You have increasing pain or severe headaches.  You have nausea, vomiting, or drowsiness.  You have swelling around your face.  You have vision problems.  You have a stiff neck.  You have difficulty breathing. MAKE SURE YOU:   Understand these instructions.  Will watch your condition.  Will get help right away if you are not doing well or get worse. Document Released: 04/03/2005 Document Revised: 06/26/2011 Document Reviewed: 04/18/2011 [facility] Patient Information 2014 [place], [place].

## 2015-08-10 NOTE — Progress Notes (Signed)
Patient presents to clinic today c/o recurrence of sinus pressure, sinus pain, ear pain and headaches. Patient recently completed course of Augmenting for sinusitis. Endorses symptoms improved greatly while on antibiotic but now have returned and worsened. Denies wheezing or SOB. Denies fever, chills.   Past Medical History  Diagnosis Date  . Asthma   . Depression   . Frequent headaches   . GERD (gastroesophageal reflux disease)   . Hay fever   . Environmental allergies   . Elevated blood pressure   . Hyperlipidemia   . Migraines   . Colon polyps   . Thyroid disease   . Chicken pox     Current Outpatient Prescriptions on File Prior to Visit  Medication Sig Dispense Refill  . acyclovir (ZOVIRAX) 400 MG tablet TAKE 1 TABLET BY MOUTH THREE TIMES DAILY AS NEEDED 30 tablet 5  . azelastine (ASTELIN) 0.1 % nasal spray 2 sprays 2 (two) times daily.  6  . DULERA 200-5 MCG/ACT AERO Inhale 2 puffs into the lungs 2 (two) times daily.  3  . levalbuterol (XOPENEX HFA) 45 MCG/ACT inhaler Inhale 2 puffs into the lungs every 4 (four) hours as needed for wheezing (Q4-6 Hours PRN).    Marland Kitchen levothyroxine (SYNTHROID, LEVOTHROID) 112 MCG tablet TAKE 1 TABLET(112 MCG) BY MOUTH DAILY 90 tablet 0  . lovastatin (MEVACOR) 10 MG tablet Take 1 tablet (10 mg total) by mouth at bedtime. 30 tablet 5  . mesalamine (LIALDA) 1.2 G EC tablet Take 2.4 g by mouth daily.    . montelukast (SINGULAIR) 10 MG tablet Take 10 mg by mouth at bedtime.     Marland Kitchen olmesartan (BENICAR) 40 MG tablet TAKE 1 TABLET BY MOUTH EVERY NIGHT AT BEDTIME 90 tablet 1  . promethazine (PHENERGAN) 25 MG tablet Take 1 tablet (25 mg total) by mouth every 6 (six) hours as needed for nausea or vomiting. 30 tablet 1  . triamcinolone (NASACORT ALLERGY 24HR) 55 MCG/ACT AERO nasal inhaler Place 1 spray into the nose daily.    Marland Kitchen venlafaxine XR (EFFEXOR-XR) 75 MG 24 hr capsule TAKE 1 CAPSULE BY MOUTH EVERY DAY WITH BREAKFAST 90 capsule 1  . zolpidem (AMBIEN) 5  MG tablet TAKE 1 TABLET BY MOUTH AT BEDTIME AS NEEDED 30 tablet 3   No current facility-administered medications on file prior to visit.    Allergies  Allergen Reactions  . Ace Inhibitors Other (See Comments)    Angina  . Cefaclor   . Lisinopril Cough  . Losartan Potassium   . Sulfonamide Derivatives     Family History  Problem Relation Age of Onset  . Arthritis Mother 60    Deceased  . Breast cancer Mother   . Lung cancer Mother   . Hyperlipidemia Mother   . Heart disease Mother   . Hypertension Mother   . Diabetes Mother   . Crohn's disease Mother   . Diabetes Maternal Grandfather   . Heart disease Maternal Grandfather   . Heart attack Maternal Grandfather   . Colitis Sister     Social History   Social History  . Marital Status: Married    Spouse Name: N/A  . Number of Children: N/A  . Years of Education: N/A   Social History Main Topics  . Smoking status: Former Smoker -- 4.00 packs/day for 15 years    Types: Cigarettes    Quit date: 09/14/1993  . Smokeless tobacco: Never Used     Comment: Quit >20 yrs ago  . Alcohol  Use: 1.8 oz/week    3 Glasses of wine per week  . Drug Use: No  . Sexual Activity: Not Asked   Other Topics Concern  . None   Social History Narrative   Review of Systems - See HPI.  All other ROS are negative.  BP 118/70 mmHg  Pulse 98  Temp(Src) 98.7 F (37.1 C) (Oral)  Ht 5' 3"  (1.6 m)  Wt 180 lb (81.647 kg)  BMI 31.89 kg/m2  SpO2 96%  Physical Exam  Constitutional: She is oriented to person, place, and time and well-developed, well-nourished, and in no distress.  HENT:  Head: Normocephalic and atraumatic.  Right Ear: Tympanic membrane normal.  Left Ear: Tympanic membrane normal.  Nose: Mucosal edema and rhinorrhea present. Right sinus exhibits frontal sinus tenderness. Left sinus exhibits frontal sinus tenderness.  Mouth/Throat: Uvula is midline, oropharynx is clear and moist and mucous membranes are normal.  Eyes:  Conjunctivae are normal.  Neck: Neck supple.  Cardiovascular: Normal rate, regular rhythm, normal heart sounds and intact distal pulses.   Pulmonary/Chest: Effort normal and breath sounds normal. No respiratory distress. She has no wheezes. She has no rales. She exhibits no tenderness.  Neurological: She is alert and oriented to person, place, and time.  Skin: Skin is warm and dry. No rash noted.  Psychiatric: Affect normal.  Vitals reviewed.    Assessment/Plan: 1. Recurrent sinusitis Rx Levaquin.  Increase fluids.  Rest.  Saline nasal spray.  Probiotic.  Mucinex as directed.  Humidifier in bedroom. IM Depomedrol given. Tussionex per orders.  Call or return to clinic if symptoms are not improving.

## 2015-08-12 ENCOUNTER — Encounter: Payer: Self-pay | Admitting: Physician Assistant

## 2015-08-12 MED ORDER — METHYLPREDNISOLONE 4 MG PO TBPK: ORAL_TABLET | ORAL | Status: DC

## 2015-08-12 MED FILL — METHYLPREDNISOLONE 4 MG TAB: 4 | 6 days supply | Qty: 21 | Fill #0

## 2015-08-12 NOTE — Addendum Note (Signed)
Addended by: Raiford Noble on: 08/12/2015 09:58 AM   Modules accepted: Orders

## 2015-08-17 ENCOUNTER — Encounter: Payer: Self-pay | Admitting: Physician Assistant

## 2015-08-30 ENCOUNTER — Encounter: Payer: Self-pay | Admitting: Physician Assistant

## 2015-08-30 ENCOUNTER — Ambulatory Visit (INDEPENDENT_AMBULATORY_CARE_PROVIDER_SITE_OTHER): Payer: 59 | Admitting: Physician Assistant

## 2015-08-30 VITALS — BP 118/74 | HR 76 | Temp 98.0°F | Resp 16 | Ht 63.0 in | Wt 171.5 lb

## 2015-08-30 DIAGNOSIS — E039 Hypothyroidism, unspecified: Secondary | ICD-10-CM | POA: Diagnosis not present

## 2015-08-30 DIAGNOSIS — I83009 Varicose veins of unspecified lower extremity with ulcer of unspecified site: Secondary | ICD-10-CM

## 2015-08-30 DIAGNOSIS — L97909 Non-pressure chronic ulcer of unspecified part of unspecified lower leg with unspecified severity: Secondary | ICD-10-CM

## 2015-08-30 DIAGNOSIS — I10 Essential (primary) hypertension: Secondary | ICD-10-CM

## 2015-08-30 DIAGNOSIS — E785 Hyperlipidemia, unspecified: Secondary | ICD-10-CM | POA: Diagnosis not present

## 2015-08-30 DIAGNOSIS — R252 Cramp and spasm: Secondary | ICD-10-CM

## 2015-08-30 LAB — COMPREHENSIVE METABOLIC PANEL
ALK PHOS: 63 U/L (ref 39–117)
ALT: 13 U/L (ref 0–35)
AST: 12 U/L (ref 0–37)
Albumin: 4.5 g/dL (ref 3.5–5.2)
BILIRUBIN TOTAL: 0.7 mg/dL (ref 0.2–1.2)
BUN: 13 mg/dL (ref 6–23)
CO2: 27 meq/L (ref 19–32)
Calcium: 9.6 mg/dL (ref 8.4–10.5)
Chloride: 106 mEq/L (ref 96–112)
Creatinine, Ser: 0.64 mg/dL (ref 0.40–1.20)
GFR: 103.43 mL/min (ref >60.00)
GLUCOSE: 105 mg/dL — AB (ref 70–99)
Potassium: 3.7 mEq/L (ref 3.5–5.1)
SODIUM: 142 meq/L (ref 135–145)
TOTAL PROTEIN: 6.8 g/dL (ref 6.0–8.3)

## 2015-08-30 LAB — CBC
HCT: 39.2 % (ref 36.0–46.0)
Hemoglobin: 12.9 g/dL (ref 12.0–15.0)
MCHC: 33 g/dL (ref 30.0–36.0)
MCV: 83.1 fl (ref 78.0–100.0)
Platelets: 360 10*3/uL (ref 150.0–400.0)
RBC: 4.72 Mil/uL (ref 3.87–5.11)
RDW: 14.5 % (ref 11.5–15.5)
WBC: 8.9 10*3/uL (ref 4.0–10.5)

## 2015-08-30 LAB — TSH: TSH: 0.2 u[IU]/mL — ABNORMAL LOW (ref 0.35–4.50)

## 2015-08-30 LAB — HEMOGLOBIN A1C: Hgb A1c MFr Bld: 6.4 % (ref 4.6–6.5)

## 2015-08-30 LAB — LIPID PANEL
CHOL/HDL RATIO: 6
Cholesterol: 225 mg/dL — ABNORMAL HIGH (ref 0–200)
HDL: 38 mg/dL — ABNORMAL LOW (ref >39.00)
LDL CALC: 148 mg/dL — AB (ref 0–99)
NONHDL: 187.48
Triglycerides: 198 mg/dL — ABNORMAL HIGH (ref 0.0–149.0)
VLDL: 39.6 mg/dL (ref 0.0–40.0)

## 2015-08-30 LAB — FERRITIN: FERRITIN: 12.1 ng/mL (ref 10.0–291.0)

## 2015-08-30 NOTE — Progress Notes (Signed)
Pre visit review using our clinic review tool, if applicable. No additional management support is needed unless otherwise documented below in the visit note/SLS  

## 2015-08-30 NOTE — Patient Instructions (Signed)
Please go to the lab for blood work. I will call you with your results.  Please continue medications for now. Continue your diet and exercise regimen. Please add-on a G2 Gatorade (small) daily. We will alter regimen based on your lab results.  You will be contacted by Vascular Surgery for assessment of varicose veins and related symptoms.

## 2015-08-30 NOTE — Progress Notes (Addendum)
Patient presents to clinic today for follow-up of chronic medical conditions and acute concerns.  Hypertension --Is currently taking Benicar 40 mg daily. Endorses taking as directed. Patient denies chest pain, palpitations, lightheadedness, dizziness, vision changes or frequent headaches.  BP Readings from Last 3 Encounters:  08/30/15 118/74  08/10/15 118/70  07/27/15 110/70   Hyperlipidemia -- Endorses taking Lovastatin QOD to prevent myalgias. Is also taking a daily fish oil. Body mass index is 30.39 kg/(m^2). Patient has recently joined [employer] to help promote weight loss. Is about to rejoin a gym with her husband and son.   Depression -- Is currently on Effexor-XR 75 mg daily. Is also taking Ambien 5 mg nightly. Endorses doing well overall. Has good days and bad days after the death of her daughter.   Patient endorses varicose/spider veins of bilateral lower extremities, worsening over time. Endorses pain with prolonged standing. Denies peripheral edema. Denies trauma or injury. Also endorses cramping of bilateral lower extremities over the past 2 weeks, worse at night. Endorses well-balanced diet and good hydration.   Past Medical History  Diagnosis Date  . Asthma   . Depression   . Frequent headaches   . GERD (gastroesophageal reflux disease)   . Hay fever   . Environmental allergies   . Elevated blood pressure   . Hyperlipidemia   . Migraines   . Colon polyps   . Thyroid disease   . Chicken pox     Current Outpatient Prescriptions on File Prior to Visit  Medication Sig Dispense Refill  . acyclovir (ZOVIRAX) 400 MG tablet TAKE 1 TABLET BY MOUTH THREE TIMES DAILY AS NEEDED 30 tablet 5  . azelastine (ASTELIN) 0.1 % nasal spray 2 sprays 2 (two) times daily.  6  . DULERA 200-5 MCG/ACT AERO Inhale 2 puffs into the lungs 2 (two) times daily.  3  . levalbuterol (XOPENEX HFA) 45 MCG/ACT inhaler Inhale 2 puffs into the lungs every 4 (four) hours as needed for wheezing  (Q4-6 Hours PRN).    Marland Kitchen levothyroxine (SYNTHROID, LEVOTHROID) 112 MCG tablet TAKE 1 TABLET(112 MCG) BY MOUTH DAILY 90 tablet 0  . lovastatin (MEVACOR) 10 MG tablet Take 1 tablet (10 mg total) by mouth at bedtime. 30 tablet 5  . mesalamine (LIALDA) 1.2 G EC tablet Take 2.4 g by mouth daily.    . montelukast (SINGULAIR) 10 MG tablet Take 10 mg by mouth at bedtime.     Marland Kitchen olmesartan (BENICAR) 40 MG tablet TAKE 1 TABLET BY MOUTH EVERY NIGHT AT BEDTIME 90 tablet 1  . promethazine (PHENERGAN) 25 MG tablet Take 1 tablet (25 mg total) by mouth every 6 (six) hours as needed for nausea or vomiting. 30 tablet 1  . triamcinolone (NASACORT ALLERGY 24HR) 55 MCG/ACT AERO nasal inhaler Place 1 spray into the nose daily.    Marland Kitchen venlafaxine XR (EFFEXOR-XR) 75 MG 24 hr capsule TAKE 1 CAPSULE BY MOUTH EVERY DAY WITH BREAKFAST 90 capsule 1  . zolpidem (AMBIEN) 5 MG tablet TAKE 1 TABLET BY MOUTH AT BEDTIME AS NEEDED 30 tablet 2   No current facility-administered medications on file prior to visit.    Allergies  Allergen Reactions  . Ace Inhibitors Other (See Comments)    Angina  . Cefaclor   . Lisinopril Cough  . Losartan Potassium   . Sulfonamide Derivatives     Family History  Problem Relation Age of Onset  . Arthritis Mother 18    Deceased  . Breast cancer Mother   .  Lung cancer Mother   . Hyperlipidemia Mother   . Heart disease Mother   . Hypertension Mother   . Diabetes Mother   . Crohn's disease Mother   . Diabetes Maternal Grandfather   . Heart disease Maternal Grandfather   . Heart attack Maternal Grandfather   . Colitis Sister     Social History   Social History  . Marital Status: Married    Spouse Name: N/A  . Number of Children: N/A  . Years of Education: N/A   Social History Main Topics  . Smoking status: Former Smoker -- 4.00 packs/day for 15 years    Types: Cigarettes    Quit date: 09/14/1993  . Smokeless tobacco: Never Used     Comment: Quit >20 yrs ago  . Alcohol Use: 1.8  oz/week    3 Glasses of wine per week  . Drug Use: No  . Sexual Activity: Not Asked   Other Topics Concern  . None   Social History Narrative   Review of Systems - See HPI.  All other ROS are negative.  Ht 5' 3"  (1.6 m)  Wt 171 lb 8 oz (77.792 kg)  BMI 30.39 kg/m2  Physical Exam  Constitutional: She is oriented to person, place, and time and well-developed, well-nourished, and in no distress.  HENT:  Head: Normocephalic and atraumatic.  Right Ear: External ear normal.  Left Ear: External ear normal.  Mouth/Throat: Oropharynx is clear and moist.  Eyes: Conjunctivae are normal.  Neck: Neck supple. No thyromegaly present.  Cardiovascular: Normal rate, regular rhythm, normal heart sounds and intact distal pulses.   Pulses:      Popliteal pulses are 2+ on the right side, and 2+ on the left side.       Dorsalis pedis pulses are 2+ on the right side, and 2+ on the left side.  Venous varicosities noted of bilateral popliteal regions.  Pulmonary/Chest: Effort normal and breath sounds normal. No respiratory distress. She has no wheezes. She has no rales. She exhibits no tenderness.  Musculoskeletal:       Right lower leg: She exhibits no tenderness and no swelling.       Left lower leg: She exhibits no tenderness and no swelling.  Neurological: She is alert and oriented to person, place, and time.  Skin: Skin is warm and dry. No rash noted.  Psychiatric: Affect normal.  Vitals reviewed.    Assessment/Plan: 1. Hypothyroidism, unspecified hypothyroidism type Will repeat TSH today. Will alter dose of levothyroxine based on results. - TSH  2. Essential hypertension BP well-controlled. Asymptomatic. Continue current regimen. Will check BMP and A1C. - Comprehensive metabolic panel - Hemoglobin A1c  3. Hyperlipidemia Will repeat lipid panel. Discussed continuation of diet and resumption of exercise regimen to promote weight loss.. - Hemoglobin A1c - Lipid panel  4. Cramp of  both lower extremities Will check labs to assess. Exam within normal limits. Will add on MTV and daily G2 Gatorade. - CBC - Comprehensive metabolic panel - Ferritin  5. Varicose veins of lower extremities with ulcer, unspecified laterality [facility]) Referral to vascular surgery placed for further assessment and management. - Ambulatory referral to Vascular Surgery  Leeanne Rio, PA-C

## 2015-08-31 ENCOUNTER — Telehealth: Payer: Self-pay | Admitting: *Deleted

## 2015-08-31 DIAGNOSIS — R7303 Prediabetes: Secondary | ICD-10-CM

## 2015-08-31 DIAGNOSIS — R739 Hyperglycemia, unspecified: Secondary | ICD-10-CM

## 2015-08-31 DIAGNOSIS — R7989 Other specified abnormal findings of blood chemistry: Secondary | ICD-10-CM

## 2015-08-31 MED ORDER — LEVOTHYROXINE SODIUM 100 MCG PO TABS: 100.0000 ug | ORAL_TABLET | Freq: Every day | ORAL | Status: DC

## 2015-08-31 MED ORDER — BACLOFEN 10 MG PO TABS: 10.0000 mg | ORAL_TABLET | Freq: Every day | ORAL | Status: DC

## 2015-08-31 MED ORDER — LOVASTATIN 40 MG PO TABS: 40.0000 mg | ORAL_TABLET | Freq: Every day | ORAL | Status: DC

## 2015-08-31 NOTE — Telephone Encounter (Signed)
-----   Message from Brunetta Jeans, PA-C sent at 08/30/2015  9:41 PM EDT ----- Cholesterol has increased since last check. Recommend we increase Mevacor to 40 mg daily. Kidney, Liver and electrolytes look good. A1C is at 6.4 which puts her right at the diabetic range. Increase exercise. Limit carbohydrates. Repeat A1C in 6 months. Her TSH level remains low so we need to decrease dose as she is getting too much medication. Recommend we decrease to 100 mcg daily. Repeat TSH in 6 weeks. Finally, iron level looks good. Follow-up with vascular surgery for leg pain as I feel varicosities are contributing. We can consider starting a muscle relaxant at night for cramps if she would like.

## 2015-08-31 NOTE — Telephone Encounter (Signed)
Patient informed, understood & agreed; new Rx [3] and future lab orders placed [2] per provider instructions/SLS 05/16

## 2015-09-02 ENCOUNTER — Encounter: Payer: Self-pay | Admitting: Vascular Surgery

## 2015-09-02 ENCOUNTER — Ambulatory Visit (INDEPENDENT_AMBULATORY_CARE_PROVIDER_SITE_OTHER): Payer: 59 | Admitting: Vascular Surgery

## 2015-09-02 ENCOUNTER — Ambulatory Visit (HOSPITAL_COMMUNITY)
Admission: RE | Admit: 2015-09-02 | Discharge: 2015-09-02 | Disposition: A | Discharge status: Home / Self Care | Payer: 59 | Source: Ambulatory Visit | Attending: Vascular Surgery | Admitting: Vascular Surgery

## 2015-09-02 ENCOUNTER — Other Ambulatory Visit: Payer: Self-pay | Admitting: *Deleted

## 2015-09-02 DIAGNOSIS — I83893 Varicose veins of bilateral lower extremities with other complications: Secondary | ICD-10-CM | POA: Diagnosis not present

## 2015-09-02 DIAGNOSIS — I872 Venous insufficiency (chronic) (peripheral): Secondary | ICD-10-CM

## 2015-09-02 DIAGNOSIS — M79605 Pain in left leg: Secondary | ICD-10-CM

## 2015-09-02 DIAGNOSIS — I83891 Varicose veins of right lower extremities with other complications: Secondary | ICD-10-CM | POA: Insufficient documentation

## 2015-09-02 DIAGNOSIS — M79604 Pain in right leg: Secondary | ICD-10-CM | POA: Diagnosis present

## 2015-09-02 DIAGNOSIS — I8391 Asymptomatic varicose veins of right lower extremity: Secondary | ICD-10-CM | POA: Insufficient documentation

## 2015-09-02 HISTORY — DX: Varicose veins of right lower extremity with other complications: I83.891

## 2015-09-02 HISTORY — DX: Venous insufficiency (chronic) (peripheral): I87.2

## 2015-09-02 NOTE — Progress Notes (Signed)
Reason for referral: Swollen right leg  History of Present Illness  Erin Fields is a 50 y.o. female who presents with chief complaint: swollen leg.  Patient notes, onset of swelling 1 month ago, associated with sitting.  The patient has had no history of DVT, has a history of varicose vein, no history of venous stasis ulcers, no history of lymphedema and no history of skin changes in lower legs.  There is a family history of venous disorders.  The patient has  used compression stockings in the past.  She had right popliteal vein stripping 30 years ago in her 70's.   Past medical history includes: Hypertension managed with Benicar, hyperlipidemia managed with Lovastatin, and Emphysema.  She has had 2 pregnancies.     Past Medical History  Diagnosis Date  . Asthma   . Depression   . Frequent headaches   . GERD (gastroesophageal reflux disease)   . Hay fever   . Environmental allergies   . Elevated blood pressure   . Hyperlipidemia   . Migraines   . Colon polyps   . Thyroid disease   . Chicken pox   . Varicose veins     Past Surgical History  Procedure Laterality Date  . Foot surgery  2012  . Ablation  2011  . Tonsillectomy  1980  . Wisdom tooth extraction    . Vein surgery      Removal Right Leg  . Hand surgery      Social History   Social History  . Marital Status: Married    Spouse Name: N/A  . Number of Children: N/A  . Years of Education: N/A   Occupational History  . Not on file.   Social History Main Topics  . Smoking status: Former Smoker -- 4.00 packs/day for 15 years    Types: Cigarettes    Quit date: 09/14/1993  . Smokeless tobacco: Never Used     Comment: Quit >20 yrs ago  . Alcohol Use: 1.8 oz/week    3 Glasses of wine per week  . Drug Use: No  . Sexual Activity: Not on file   Other Topics Concern  . Not on file   Social History Narrative    Family History  Problem Relation Age of Onset  . Arthritis Mother 14    Deceased  .  Breast cancer Mother   . Lung cancer Mother   . Hyperlipidemia Mother   . Heart disease Mother   . Hypertension Mother   . Diabetes Mother   . Crohn's disease Mother   . Diabetes Maternal Grandfather   . Heart disease Maternal Grandfather   . Heart attack Maternal Grandfather   . Colitis Sister     Current Outpatient Prescriptions on File Prior to Visit  Medication Sig Dispense Refill  . acyclovir (ZOVIRAX) 400 MG tablet TAKE 1 TABLET BY MOUTH THREE TIMES DAILY AS NEEDED 30 tablet 5  . azelastine (ASTELIN) 0.1 % nasal spray 2 sprays 2 (two) times daily.  6  . baclofen (LIORESAL) 10 MG tablet Take 1 tablet (10 mg total) by mouth at bedtime. 30 each 1  . DULERA 200-5 MCG/ACT AERO Inhale 2 puffs into the lungs 2 (two) times daily.  3  . levalbuterol (XOPENEX HFA) 45 MCG/ACT inhaler Inhale 2 puffs into the lungs every 4 (four) hours as needed for wheezing (Q4-6 Hours PRN).    Marland Kitchen levothyroxine (SYNTHROID, LEVOTHROID) 100 MCG tablet Take 1 tablet (100 mcg total)  by mouth daily. 90 tablet 1  . lovastatin (MEVACOR) 40 MG tablet Take 1 tablet (40 mg total) by mouth at bedtime. 90 tablet 1  . mesalamine (LIALDA) 1.2 G EC tablet Take 2.4 g by mouth daily.    . montelukast (SINGULAIR) 10 MG tablet Take 10 mg by mouth at bedtime.     Marland Kitchen olmesartan (BENICAR) 40 MG tablet TAKE 1 TABLET BY MOUTH EVERY NIGHT AT BEDTIME 90 tablet 1  . promethazine (PHENERGAN) 25 MG tablet Take 1 tablet (25 mg total) by mouth every 6 (six) hours as needed for nausea or vomiting. 30 tablet 1  . triamcinolone (NASACORT ALLERGY 24HR) 55 MCG/ACT AERO nasal inhaler Place 1 spray into the nose daily.    Marland Kitchen venlafaxine XR (EFFEXOR-XR) 75 MG 24 hr capsule TAKE 1 CAPSULE BY MOUTH EVERY DAY WITH BREAKFAST 90 capsule 1  . zolpidem (AMBIEN) 5 MG tablet TAKE 1 TABLET BY MOUTH AT BEDTIME AS NEEDED 30 tablet 2   No current facility-administered medications on file prior to visit.    Allergies as of 09/02/2015 - Review Complete  09/02/2015  Allergen Reaction Noted  . Ace inhibitors Other (See Comments) 04/14/2013  . Cefaclor  11/29/2006  . Lisinopril Cough 04/14/2013  . Losartan potassium  04/14/2013  . Sulfonamide derivatives  11/29/2006     ROS:   General:  No weight loss, Fever, chills  HEENT: No recent headaches, no nasal bleeding, no visual changes, no sore throat  Neurologic: No dizziness, blackouts, seizures. No recent symptoms of stroke or mini- stroke. No recent episodes of slurred speech, or temporary blindness.  Cardiac: No recent episodes of chest pain/pressure, no shortness of breath at rest.  No shortness of breath with exertion.  Denies history of atrial fibrillation or irregular heartbeat  Vascular: No history of rest pain in feet.  No history of claudication.  No history of non-healing ulcer, No history of DVT   Pulmonary: No home oxygen, no productive cough, no hemoptysis,  positive asthma or wheezing  Musculoskeletal:  [ ]  Arthritis, [ ]  Low back pain,  [ ]  Joint pain  Hematologic:No history of hypercoagulable state.  No history of easy bleeding.  No history of anemia  Gastrointestinal: No hematochezia or melena,  No gastroesophageal reflux, no trouble swallowing  Urinary: [ ]  chronic Kidney disease, [ ]  on HD - [ ]  MWF or [ ]  TTHS, [ ]  Burning with urination, [ ]  Frequent urination, [ ]  Difficulty urinating;   Skin: No rashes  Psychological: No history of anxiety,  No history of depression  Physical Examination  Filed Vitals:   09/02/15 1456  BP: 125/78  Pulse: 87  Temp: 98.3 F (36.8 C)  Resp: 14  Height: 5' 3.5" (1.613 m)  Weight: 173 lb (78.472 kg)  SpO2: 97%    Body mass index is 30.16 kg/(m^2).  General:  Alert and oriented, no acute distress HEENT: Normal Neck: No bruit or JVD Pulmonary: Clear to auscultation bilaterally Cardiac: Regular Rate and Rhythm without murmur Abdomen: Soft, non-tender, non-distended, no mass, no scars Skin: No rash, posterior right  knee palpable varicose vein that is tender to palpation Extremity Pulses:  2+ radial, brachial, femoral, dorsalis pedis, posterior tibial pulses bilaterally Musculoskeletal: No deformity or edema  Neurologic: Upper and lower extremity motor 5/5 and symmetric Psychiatry: mood and affect appropriate Lymph: no obvious palpable LAD  DATA: Venous reflux right LE  No DVT Venous incompetence is noted in right GSV and CFV/FV veins    Assessment:  chronic venous insufficiency (Deep and superficial reflux), sx varicose vein in RLE, s/p R leg vein stripping  Plan: We will prescribe thigh high compression 20-30 mmhg daily and elevation at night water therapy is suggested as well.  She will f/u in our vein clinic in 3 months for possible intervention.  Theda Sers, Duha Abair [facility] PA-C [facility] [place] Office: [phone]   The patient was seen in conjunction with Dr. Bridgett Larsson today  Addendum  I have independently interviewed and examined the patient, and I agree with the physician assistant's findings.  In reviewing the patient's study, she still has her GSV, suggesting he had a limited stripping in the R calf.  She has had recurrence of sx varicosity in her right calf extending to her popliteal fossa.  Given the lack of significant dilation of her R GSV, I suspect this sx varicosity is being fed by a perforator branch.  Will have her undertake 3 month of compressive therapy and then have her come back in the vein clinic for further evaluation: stab phlebectomy vs sclerotherapy.  Adele Barthel, MD [facility] [place] Office: [phone] Pager: [phone]  09/02/2015, 3:59 PM

## 2015-09-09 ENCOUNTER — Encounter: Payer: Self-pay | Admitting: Physician Assistant

## 2015-09-10 ENCOUNTER — Other Ambulatory Visit: Payer: Self-pay | Admitting: *Deleted

## 2015-09-10 DIAGNOSIS — I83813 Varicose veins of bilateral lower extremities with pain: Secondary | ICD-10-CM

## 2015-09-20 ENCOUNTER — Other Ambulatory Visit: Payer: Self-pay | Admitting: Physician Assistant

## 2015-09-20 NOTE — Telephone Encounter (Signed)
Rx request Denied, patient's dosage has changed to 100 mcg/SLS 06/05

## 2015-10-03 ENCOUNTER — Other Ambulatory Visit: Payer: Self-pay | Admitting: Physician Assistant

## 2015-11-01 ENCOUNTER — Ambulatory Visit: Payer: 59 | Admitting: Physician Assistant

## 2015-11-01 DIAGNOSIS — Z0289 Encounter for other administrative examinations: Secondary | ICD-10-CM

## 2015-11-11 ENCOUNTER — Other Ambulatory Visit: Payer: Self-pay | Admitting: Physician Assistant

## 2015-11-11 NOTE — Telephone Encounter (Signed)
Last OV: 08/30/15 Last filled: 08/10/15, #30, 2 RF  Sig: TAKE 1 TABLET BY MOUTH AT BEDTIME AS NEEDED UDS: Not on file

## 2015-11-12 NOTE — Telephone Encounter (Signed)
Refill granted. Rx faxed to pharmacy. Please inform patient.

## 2015-11-15 NOTE — Telephone Encounter (Signed)
Left detailed message informing patient.

## 2015-11-18 ENCOUNTER — Encounter: Payer: Self-pay | Admitting: Pulmonary Disease

## 2015-11-18 ENCOUNTER — Ambulatory Visit (INDEPENDENT_AMBULATORY_CARE_PROVIDER_SITE_OTHER): Payer: 59 | Admitting: Pulmonary Disease

## 2015-11-18 VITALS — BP 110/70 | HR 82 | Temp 99.1°F | Ht 63.0 in | Wt 175.4 lb

## 2015-11-18 DIAGNOSIS — Z87891 Personal history of nicotine dependence: Secondary | ICD-10-CM

## 2015-11-18 DIAGNOSIS — J302 Other seasonal allergic rhinitis: Secondary | ICD-10-CM

## 2015-11-18 DIAGNOSIS — J4520 Mild intermittent asthma, uncomplicated: Secondary | ICD-10-CM

## 2015-11-18 HISTORY — DX: Personal history of nicotine dependence: Z87.891

## 2015-11-18 NOTE — Patient Instructions (Signed)
Today we updated your med list in our EPIC system...    Continue your current medications the same...  Continue your Dulera200-2sp twice daily, Singulair10/d, XopenexHFA as needed...  Continue your allergy meds per DrSharma w/ shots weekly, OTC Antihist. Nasacort, Astelin as directed...  Call for any questions...  Let's plan a follow up visit in 66yr sooner if needed for ant breathing problems..Marland KitchenMarland Kitchen

## 2015-11-18 NOTE — Progress Notes (Signed)
Subjective:     Patient ID: Erin [place], female   DOB: July 01, 1965, 50 y.o.   MRN: [id number]  HPI ~  Sep 15, 2014:  Initial pulmonary consult by SN>        50 y/o WF, referred by Erin Aquas PA at [facility], for pulmonary evaluation>  She has a hx Asthma, AR and recurrent bronchitis & sinusitis, currently managed by [place]- she was prev seen by Erin [place] in 2006 but all records are on the old paper charts;  Erin Fields is c/o 1072mohx cough, prod of a small amt of green phlegm, and increased SOB/DOE progressing to problem w/ ADLs recently; she notes occ left CP "like stress on my heart", worse w/ a deep breath and when questioned further the SOB she is experiencing is a sensation that she can't get the air "IN", can't get a good deep breath & doesn't feel satisfied breathing; she also reports a hx of GERD & Crohn's dis- followed by [place]& she takes ?OTC Prevacid Bid but not following an anti-reflux regimen...      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       Current Meds> Dulera200- 2spBid, Singulair10, XopenexHFA (using it 1-2 times daily she says); Astelin- 2spBid, Nasacort- 2spQhs, Tussionex prn, on Allergy shots for >20y61yrrev testing allergic to everything she says) EXAM shows Afeb, VSS, O2sat=96% on RA;  HEENT- neg, mallampati1;  Chest- clear w/o w/r/r;  Heart- RR  w/o m/r/g;  Abd- soft, neg;  Ext- w/o c/c/e...   Old data from Paper Chart is not available to review...  CXR 08/13/14 showed norm heart size, mild right diaph eventration, clear lungs, NAD...   LABS 5/16:  Chems- wnl x BS=122, A1c=6.4;  CBC- wnl, diff not done- older data shows norm diff (1%eos);  TSH=0.18;   She had [place] UTI 5/16 treated w/ Ceftin500Bid...   Spirometry 09/15/14 showed FVC=2.75 (87%), FEV1=2.33 (90%), %1sec=85, mid-flows were wnl at 105% predicted...   Additional LABS> IgE level= 129, RAST panel all neg x Ragweed at 0.16 (low titer)...   CT Chest >> done 09/18/14 & showed norm heart size, min atherosclerotic changes in Ao, no adenopathy; tiny 3mm 12m nodules, min emphysema, otherw clear lungs; asymmetric elev of right hemidiaph...  IMP/PLAN>>  [place] signif remote smoking hx, but her current PFTs are wnl mitigating against COPD & in favor of her Asthma diagnosis;  She appears to be well controlled on her current regimen w/ Dulera, Singulair, Xopenex, Tussionex;  The IgE level is sl elev but the RAST panel is ok x low level Ragweed antibodies;  She appears to get freq upper resp (sinus/ bronchitis) & this exacerbates her asthma;  In addition she has a long hx of GERD on OTC Prevacid  Bid but no following an antireflux regimen- we reviewed the needed elev HOB, NPO after dinner, etc... She is asked to start back on an exercise program eg- bike or swimming (non-weight bearing due to her knee).  ~  November 17, 2014:  92moROV w/ SN>  [place] that she is improved- "it's all good" she says; cough & sput diminished and breathing improved...    Allergic Rhinitis> IgE level was 129 but RAST tests all neg x ragweed; on Singulair, OTC Antihist prn & Nasacort...    Asthma & AB> CXR w/ sl elev of right diaph & CT w/ tiny 393mRUL nodules; on AdvairHFA-110 2spBid (due to insurance) & improved, reminded to rinse etc...    Ex-smoker> former heavy smoker but quit cold [place] ago...  Spirometry wnl 5/16.    GERD> on Prevacid30Bid, [facility]...    Medical issues include> HBP, HL, GERD, Crohn's, DJD, HAs, Depression... PCP is Erin Aquast LeLos YbanezWe reviewed prob list, meds, xrays and labs>>  IMP/PLAN>>  KeLakshmis reassured regarding her lung health despite her remote smoking hx; breathing is improved on the Advair110HFA inhaler; she will continue this & her allergy regimen w/ Singulair, antihist, Nasacort, etc; she is also advised to be vigorous in her support of the antireflux regimen & we reviewed this assoc w/ asthma exacerbations... We will plan a routine f/u 1y16yrconsider f/u CT to check the tiny nodules at that time, she will call prn any questions or problems...   ~  November 18, 2015:  54yr56yr w/ SN>  KellYarrowurns & reports a good yr- no new complaints or concerns;  She is stable on her regimen w/ Dulera200-2spBid regularly, Singulair10, XopenexHFA rescue inhaler prn (syas she rarely need it), Nasacort spray once daily & allergy shots from [place] once per wk;  She denies any asthma attacks or resp exacerbations over the last yr, but was treated w/ ZPak, Prednisone, NEB rx 06/2015 by [place]...    She saw [place] [facility] 09/02/15> chr ven insuffic & swelling, she had right pop vein stripping yrs ago in her 20's, she has venous reflux in right leg; REC rx w/ compression hose, elev, low sodium; consider sclerotherapy later...  We reviewed the following medical problems during today's office visit >>     Allergic Rhinitis> IgE level was 129 but RAST tests all neg x ragweed; on Singulair, OTC Antihist prn, Nasacort, and she is followed by [place] on allergy shots once per wk...    Asthma & AB> CXR w/ sl elev of right diaph & CT w/ tiny 3mm 54m nodules; on Dulera200-2spBid, XopenexHFA rescue inhaler prn & improved, reminded to rinse etc; she is allergic to ACE inhibitors...    Ex-smoker> former heavy smoker but quit cold [place] ago... Spirometry wnl 5/16.    GERD> prev on Prevacid30Bid & antireflux regimen, [facility] from [facility], she is now on LIALDA1.2g- taking 2/d and prn Phenergan...    Medical issues include> HBP (Benicar40/d), HL (Mevacor40Qod), Hypothyroid (Synthroid100), GERD, Crohn's (Lialda-2/d [facility], HAs, Depression/ insomnia (Effexor/ Ambien)... PCP is Cody Erin [facility] Financialce. EXAM shows Afeb, VSS, O2sat=95% on RA;  HEENT- neg, mallampati2;  Chest- clear w/o w/r/r;  Heart- RR w/o m/r/g;  Abd- soft, nontender, neg;  Ext- w/o c/c/e;  Neuro- wnl, no focal abn...  Last CXR was 01/19/15>  Norm heart size, clear lungs, sl ant eventration  of right hemidiaph w/o change...  LABS 08/2015 in Epic>  FLP- not at goals;  Chems- ok w/ BS=105, A1c=6.4Cr=0.64, LFTs wnl;  CBC- Hg=12.9, mcv=83, Ferritin=12;  TSH= 0.20 IMP/PLAN>>  [place] remains stable on her regimen including: Dulera200-2spBid, Singulair10, XopenexHFA prn, plus allergy shots per [place]...     Past Medical History  Diagnosis Date  . Asthma >> see above...   . Depression >> on EffexorXR75 and Ambien5...   . Frequent headaches   . GERD (gastroesophageal reflux disease) >> prev on Prevacid Bid   . Hay fever   . Crohn's Disease >> on Lialda 1.2gm- 2/d from [facility]   . Elevated blood pressure >> on Benicar40...   . Hyperlipidemia >> on Mevacor40 (only take Qod)   . Migraines   . Colon polyps   . Thyroid disease >> on Synthroid125=> decr to 100 by PCP   . Chicken pox     Past Surgical History:  Procedure Laterality Date  . ABLATION  2011  . FOOT SURGERY  2012  . HAND SURGERY    . TONSILLECTOMY  1980  . VEIN SURGERY     Removal Right Leg  . WISDOM TOOTH EXTRACTION      Outpatient Encounter Prescriptions as of 11/18/2015  Medication Sig  . acyclovir (ZOVIRAX) 400 MG tablet TAKE 1 TABLET BY MOUTH THREE TIMES DAILY AS NEEDED  . azelastine (ASTELIN) 0.1 % nasal spray 2 sprays 2 (two) times daily.   . DULERA 200-5 MCG/ACT AERO Inhale 2 puffs into the lungs 2 (two) times daily.  Marland Kitchen levalbuterol (XOPENEX HFA) 45 MCG/ACT inhaler Inhale 2 puffs into the lungs every 4 (four) hours as needed for wheezing (Q4-6 Hours PRN).  Marland Kitchen levothyroxine (SYNTHROID, LEVOTHROID) 100 MCG tablet Take 1 tablet (100 mcg total) by mouth daily.  Marland Kitchen lovastatin (MEVACOR) 40 MG tablet Take 1 tablet (40 mg total) by mouth at bedtime.  . mesalamine (LIALDA) 1.2 G EC tablet Take 2.4 g by mouth daily.  . montelukast (SINGULAIR) 10 MG tablet Take 10 mg by mouth at bedtime.   Marland Kitchen olmesartan (BENICAR) 40 MG tablet TAKE 1 TABLET BY MOUTH EVERY NIGHT AT BEDTIME  . promethazine (PHENERGAN) 25 MG tablet Take 1 tablet (25 mg total) by mouth every 6 (six) hours as needed for nausea or vomiting.  . triamcinolone (NASACORT ALLERGY 24HR) 55 MCG/ACT AERO nasal inhaler Place 1 spray into the nose daily.  Marland Kitchen venlafaxine XR (EFFEXOR-XR) 75 MG 24 hr capsule TAKE 1 CAPSULE BY MOUTH EVERY DAY WITH BREAKFAST  . zolpidem (AMBIEN) 5 MG tablet TAKE 1 TABLET BY MOUTH AT BEDTIME AS NEEDED   No facility-administered encounter medications on file as of 11/18/2015.     Allergies  Allergen Reactions  . Ace Inhibitors Other (See Comments)    Angina  . Cefaclor   . Lisinopril Cough  . Losartan Potassium   . Sulfonamide Derivatives     Immunization History  Administered Date(s) Administered  . Influenza-Unspecified 12/16/2012, 01/15/2014  . Pneumococcal Polysaccharide-23 04/17/2004, 04/14/2013  . Td 04/17/1997  . Tdap 07/22/2008    Current Medications, Allergies, Past Medical History, Past Surgical History, Family History, and Social History were reviewed in [employer] record.   Review of Systems            All symptoms NEG except where BOLDED >>  Constitutional:  F/C/S, fatigue, anorexia, unexpected weight change. HEENT:  HA, visual changes, hearing loss, earache, nasal symptoms, sore throat, mouth sores,  hoarseness. Resp:  cough, sputum,  hemoptysis; SOB, tightness, wheezing. Cardio:  CP, palpit, DOE, orthopnea, edema. GI:  N/V/D/C, blood in stool; reflux, abd pain, distention, gas. GU:  dysuria, freq, urgency, hematuria, flank pain, voiding difficulty. MS:  joint pain, swelling, tenderness, decr ROM; neck pain, back pain, etc. Neuro:  HA, tremors, seizures, dizziness, syncope, weakness, numbness, gait abn. Skin:  suspicious lesions or skin rash. Heme:  adenopathy, bruising, bleeding. Psyche:  confusion, agitation, sleep disturbance, hallucinations, anxiety, depression suicidal.   Objective:   Physical Exam      Vital Signs:  Reviewed...   General:  WD, WN, 50 y/o WF in NAD; alert & oriented; pleasant & cooperative... HEENT:  [place]/AT; Conjunctiva- pink, Sclera- nonicteric, EOM-wnl, PERRLA, EACs-clear, TMs-wnl; NOSE-clear; THROAT-clear & wnl.  Neck:  Supple w/ full ROM; no JVD; normal carotid impulses w/o bruits; no thyromegaly or nodules palpated; no lymphadenopathy.  Chest:  Clear to P & A; without wheezes, rales, or rhonchi heard. Heart:  Regular Rhythm; norm S1 & S2 without murmurs, rubs, or gallops detected. Abdomen:  Soft & nontender- no guarding or rebound; normal bowel sounds; no organomegaly or masses palpated. Ext:  decrROM; without deformities, + arthritic changes; no varicose veins, venous insuffic, or edema;  Pulses intact w/o bruits. Neuro:  No focal neuro findings; gait normal & balance OK. Derm:  No lesions noted; no rash etc. Lymph:  No cervical, supraclavicular, axillary, or inguinal adenopathy palpated.   Assessment:      IMP >>     Allergic Rhinitis> IgE level was 129 but RAST tests all neg x ragweed; on Singulair, OTC Antihist prn & Nasacort...    Asthma & AB> CXR w/ sl elev of right diaph & CT w/ tiny 8m RUL nodules; on AdvairHFA-110 2spBid (due to insurance) & improved, reminded to rinse etc...    Ex-smoker> former heavy smoker but quit [place] ago...  Spirometry wnl 5/16.    GERD> on Prevacid30Bid, [facility] reviewed the antireflux regimen...    Medical issues include> HBP, HL, GERD, Crohn's, DJD, HAs, Depression... PCP is [facility]   PLAN >>  09/15/14>   KRumihas a signif remote smoking hx, but her current PFTs are wnl mitigating against COPD & in favor of her Asthma diagnosis;  She appears to be well controlled on her current regimen w/ Dulera, Singulair, Xopenex, Tussionex;  The IgE level is sl elev but the RAST panel is ok x low level Ragweed antibodies;  She appears to get freq upper resp (sinus/ bronchitis) & this exacerbates her asthma;  In addition she has a long hx of GERD on OTC Prevacid Bid but no following an antireflux regimen- we reviewed the needed elev HOB, NPO after dinner, etc... She is asked to start back on an exercise program eg- bike or swimming (non-weight bearing due to her knee)... 11/18/15>   Erin Billingsremains stable on her regimen including: Dulera200-2spBid, Singulair10, XopenexHFA prn, plus allergy shots per [place]...       Plan:     Patient's Medications  New Prescriptions   No medications on file  Previous Medications   ACYCLOVIR (ZOVIRAX) 400 MG TABLET    TAKE 1 TABLET BY MOUTH THREE TIMES DAILY AS NEEDED   AZELASTINE (ASTELIN) 0.1 % NASAL SPRAY    2 sprays 2 (two) times daily.   DULERA 200-5 MCG/ACT AERO    Inhale 2 puffs into the lungs 2 (two) times daily.   LEVALBUTEROL (XOPENEX HFA) 45 MCG/ACT INHALER  Inhale 2 puffs into the lungs every 4 (four) hours as needed for wheezing (Q4-6 Hours PRN).   LEVOTHYROXINE (SYNTHROID, LEVOTHROID) 100 MCG TABLET    Take 1 tablet (100 mcg total) by mouth daily.   LOVASTATIN (MEVACOR) 40 MG TABLET    Take 1 tablet (40 mg total) by mouth at bedtime.   MESALAMINE (LIALDA) 1.2 G EC TABLET    Take 2.4 g by mouth daily.   MONTELUKAST (SINGULAIR) 10 MG TABLET    Take 10 mg by mouth at bedtime.    OLMESARTAN (BENICAR) 40 MG TABLET    TAKE 1  TABLET BY MOUTH EVERY NIGHT AT BEDTIME   PROMETHAZINE (PHENERGAN) 25 MG TABLET    Take 1 tablet (25 mg total) by mouth every 6 (six) hours as needed for nausea or vomiting.   TRIAMCINOLONE (NASACORT ALLERGY 24HR) 55 MCG/ACT AERO NASAL INHALER    Place 1 spray into the nose daily.   VENLAFAXINE XR (EFFEXOR-XR) 75 MG 24 HR CAPSULE    TAKE 1 CAPSULE BY MOUTH EVERY DAY WITH BREAKFAST   ZOLPIDEM (AMBIEN) 5 MG TABLET    TAKE 1 TABLET BY MOUTH AT BEDTIME AS NEEDED  Modified Medications   No medications on file  Discontinued Medications   No medications on file

## 2015-11-24 ENCOUNTER — Other Ambulatory Visit: Payer: Self-pay | Admitting: Physician Assistant

## 2015-11-26 ENCOUNTER — Other Ambulatory Visit: Payer: Self-pay | Admitting: Physician Assistant

## 2015-11-29 NOTE — Telephone Encounter (Signed)
Rx request to pharmacy/SLS  

## 2015-11-30 ENCOUNTER — Other Ambulatory Visit: Payer: Self-pay | Admitting: Physician Assistant

## 2015-12-02 ENCOUNTER — Encounter: Payer: Self-pay | Admitting: Vascular Surgery

## 2015-12-02 ENCOUNTER — Encounter (HOSPITAL_COMMUNITY): Payer: Self-pay

## 2015-12-07 ENCOUNTER — Ambulatory Visit: Payer: Self-pay | Admitting: Vascular Surgery

## 2015-12-10 ENCOUNTER — Other Ambulatory Visit: Payer: Self-pay | Admitting: Physician Assistant

## 2015-12-14 ENCOUNTER — Encounter: Payer: Self-pay | Admitting: Vascular Surgery

## 2015-12-17 ENCOUNTER — Encounter: Payer: Self-pay | Admitting: Physician Assistant

## 2015-12-17 ENCOUNTER — Ambulatory Visit (INDEPENDENT_AMBULATORY_CARE_PROVIDER_SITE_OTHER): Payer: 59 | Admitting: Physician Assistant

## 2015-12-17 VITALS — BP 132/73 | HR 83 | Temp 98.5°F

## 2015-12-17 DIAGNOSIS — J019 Acute sinusitis, unspecified: Secondary | ICD-10-CM | POA: Diagnosis not present

## 2015-12-17 DIAGNOSIS — R7989 Other specified abnormal findings of blood chemistry: Secondary | ICD-10-CM | POA: Diagnosis not present

## 2015-12-17 DIAGNOSIS — F329 Major depressive disorder, single episode, unspecified: Secondary | ICD-10-CM

## 2015-12-17 DIAGNOSIS — R7303 Prediabetes: Secondary | ICD-10-CM | POA: Diagnosis not present

## 2015-12-17 DIAGNOSIS — F32A Depression, unspecified: Secondary | ICD-10-CM

## 2015-12-17 DIAGNOSIS — B9689 Other specified bacterial agents as the cause of diseases classified elsewhere: Secondary | ICD-10-CM

## 2015-12-17 HISTORY — DX: Prediabetes: R73.03

## 2015-12-17 LAB — TSH: TSH: 0.63 u[IU]/mL (ref 0.35–4.50)

## 2015-12-17 LAB — HEMOGLOBIN A1C: Hgb A1c MFr Bld: 6.2 % (ref 4.6–6.5)

## 2015-12-17 MED ORDER — TRAZODONE HCL 50 MG PO TABS: 25.0000 mg | ORAL_TABLET | Freq: Every day | ORAL | 3 refills | Status: DC

## 2015-12-17 MED ORDER — AMOXICILLIN-POT CLAVULANATE 875-125 MG PO TABS: 1.0000 | ORAL_TABLET | Freq: Two times a day (BID) | ORAL | 0 refills | Status: DC

## 2015-12-17 MED ORDER — BENZONATATE 100 MG PO CAPS: 100.0000 mg | ORAL_CAPSULE | Freq: Three times a day (TID) | ORAL | 0 refills | Status: DC | PRN

## 2015-12-17 NOTE — Assessment & Plan Note (Signed)
Taking medications as directed. Will repeat TSH today.

## 2015-12-17 NOTE — Assessment & Plan Note (Signed)
Rx Augmentin.  Increase fluids.  Rest.  Saline nasal spray.  Probiotic.  Mucinex as directed.  Humidifier in bedroom. Tessalon per orders. Continue chronic allergy medications.  Call or return to clinic if symptoms are not improving.

## 2015-12-17 NOTE — Progress Notes (Signed)
Patient presents to clinic today c/o 2 weeks of sinus pressure, sinus pain L>R with ear pressure and sore throat with PND. Denies fever. Drainage is thick and colored per patient. Endorses cough from drainage but is mostly dry.   Patient also due for repeat TSH and A1C level.  At last check, TSH levels was decreased. As such, levothyroxine dose was decreased to 100 mcg daily. Patient endorses taking as directed. Lab Results  Component Value Date   TSH 0.20 (L) 08/30/2015   Patient also with history of anxiety and depression, currently on Effexor XR 75 mg daily. Is taking as directed. Notes breakthrough symptoms, mostly stemming around thoughts of her daughter Alinda Sierras who committed suicide within the past year. Is having difficulty with sleep. Endorses intermittent anhedonia. Denies SI/HI. Does not want higher dose of Effexor as she states she has previously been on higher dose which affected her BP substantially. Would like to discuss other options. Was previously on Lexapro without improvement in symptoms.   Past Medical History:  Diagnosis Date  . Asthma   . Chicken pox   . Colon polyps   . Depression   . Elevated blood pressure   . Environmental allergies   . Frequent headaches   . GERD (gastroesophageal reflux disease)   . Hay fever   . Hyperlipidemia   . Migraines   . Thyroid disease   . Varicose veins     Current Outpatient Prescriptions on File Prior to Visit  Medication Sig Dispense Refill  . acyclovir (ZOVIRAX) 400 MG tablet TAKE 1 TABLET BY MOUTH THREE TIMES DAILY AS NEEDED 30 tablet 5  . azelastine (ASTELIN) 0.1 % nasal spray 2 sprays 2 (two) times daily.  6  . BENICAR 40 MG tablet TAKE 1 TABLET BY MOUTH EVERY NIGHT AT BEDTIME 90 tablet 0  . DULERA 200-5 MCG/ACT AERO Inhale 2 puffs into the lungs 2 (two) times daily.  3  . levalbuterol (XOPENEX HFA) 45 MCG/ACT inhaler Inhale 2 puffs into the lungs every 4 (four) hours as needed for wheezing (Q4-6 Hours PRN).    Marland Kitchen  levothyroxine (SYNTHROID, LEVOTHROID) 100 MCG tablet TAKE 1 TABLET(100 MCG) BY MOUTH DAILY 30 tablet 0  . lovastatin (MEVACOR) 40 MG tablet TAKE 1 TABLET(40 MG) BY MOUTH AT BEDTIME 30 tablet 0  . mesalamine (LIALDA) 1.2 G EC tablet Take 2.4 g by mouth daily.    . montelukast (SINGULAIR) 10 MG tablet Take 10 mg by mouth at bedtime.     . promethazine (PHENERGAN) 25 MG tablet Take 1 tablet (25 mg total) by mouth every 6 (six) hours as needed for nausea or vomiting. 30 tablet 1  . triamcinolone (NASACORT ALLERGY 24HR) 55 MCG/ACT AERO nasal inhaler Place 1 spray into the nose daily.    Marland Kitchen venlafaxine XR (EFFEXOR-XR) 75 MG 24 hr capsule TAKE 1 CAPSULE BY MOUTH EVERY DAY WITH BREAKFAST 90 capsule 1   No current facility-administered medications on file prior to visit.     Allergies  Allergen Reactions  . Ace Inhibitors Other (See Comments)    Angina  . Cefaclor   . Lisinopril Cough  . Losartan Potassium   . Sulfonamide Derivatives     Family History  Problem Relation Age of Onset  . Arthritis Mother 56    Deceased  . Breast cancer Mother   . Lung cancer Mother   . Hyperlipidemia Mother   . Heart disease Mother   . Hypertension Mother   . Diabetes Mother   .  Crohn's disease Mother   . Diabetes Maternal Grandfather   . Heart disease Maternal Grandfather   . Heart attack Maternal Grandfather   . Colitis Sister     Social History   Social History  . Marital status: Married    Spouse name: N/A  . Number of children: N/A  . Years of education: N/A   Social History Main Topics  . Smoking status: Former Smoker    Packs/day: 4.00    Years: 15.00    Types: Cigarettes    Quit date: 09/14/1993  . Smokeless tobacco: Never Used     Comment: Quit >20 yrs ago  . Alcohol use 1.8 oz/week    3 Glasses of wine per week  . Drug use: No  . Sexual activity: Not on file   Other Topics Concern  . Not on file   Social History Narrative  . No narrative on file   Review of Systems - See  HPI.  All other ROS are negative.  BP 132/73 (BP Location: Right Arm, Patient Position: Sitting, Cuff Size: Small)   Pulse 83   Temp 98.5 F (36.9 C) (Oral)   SpO2 98%   Physical Exam  Constitutional: She is oriented to person, place, and time and well-developed, well-nourished, and in no distress.  HENT:  Head: Normocephalic and atraumatic.  Right Ear: Tympanic membrane normal.  Left Ear: Tympanic membrane normal.  Nose: Left sinus exhibits maxillary sinus tenderness and frontal sinus tenderness.  Mouth/Throat: Uvula is midline, oropharynx is clear and moist and mucous membranes are normal.  Eyes: Conjunctivae are normal.  Neck: Neck supple.  Cardiovascular: Normal rate, regular rhythm, normal heart sounds and intact distal pulses.   Pulmonary/Chest: Effort normal and breath sounds normal. No respiratory distress. She has no wheezes. She has no rales. She exhibits no tenderness.  Lymphadenopathy:    She has no cervical adenopathy.  Neurological: She is alert and oriented to person, place, and time.  Skin: Skin is warm and dry. No rash noted.  Psychiatric: Affect normal.  Vitals reviewed.  Assessment/Plan: Depression Will continue Effexor XR 75 mg daily. Will stop Ambien. Will begin Trazodone 25 mg nightly for mood and sleep. FU scheduled.  Borderline diabetes Will repeat A1C today.  Acute bacterial sinusitis Rx Augmentin.  Increase fluids.  Rest.  Saline nasal spray.  Probiotic.  Mucinex as directed.  Humidifier in bedroom. Tessalon per orders. Continue chronic allergy medications.  Call or return to clinic if symptoms are not improving.   Abnormal thyroid stimulating hormone (TSH) level Taking medications as directed. Will repeat TSH today.    Leeanne Rio, PA-C

## 2015-12-17 NOTE — Assessment & Plan Note (Signed)
Will continue Effexor XR 75 mg daily. Will stop Ambien. Will begin Trazodone 25 mg nightly for mood and sleep. FU scheduled.

## 2015-12-17 NOTE — Patient Instructions (Signed)
Please take antibiotic as directed.  Increase fluid intake.  Use Saline nasal spray.  Take a daily multivitamin. Continue chronic medications as directed.  Place a humidifier in the bedroom.  Please call or return clinic if symptoms are not improving.  Don't forget to stop by the lab for blood work! The orders are already in the system.  Sinusitis Sinusitis is redness, soreness, and swelling (inflammation) of the paranasal sinuses. Paranasal sinuses are air pockets within the bones of your face (beneath the eyes, the middle of the forehead, or above the eyes). In healthy paranasal sinuses, mucus is able to drain out, and air is able to circulate through them by way of your nose. However, when your paranasal sinuses are inflamed, mucus and air can become trapped. This can allow bacteria and other germs to grow and cause infection. Sinusitis can develop quickly and last only a short time (acute) or continue over a long period (chronic). Sinusitis that lasts for more than 12 weeks is considered chronic.  CAUSES  Causes of sinusitis include:  Allergies.  Structural abnormalities, such as displacement of the cartilage that separates your nostrils (deviated septum), which can decrease the air flow through your nose and sinuses and affect sinus drainage.  Functional abnormalities, such as when the small hairs (cilia) that line your sinuses and help remove mucus do not work properly or are not present. SYMPTOMS  Symptoms of acute and chronic sinusitis are the same. The primary symptoms are pain and pressure around the affected sinuses. Other symptoms include:  Upper toothache.  Earache.  Headache.  Bad breath.  Decreased sense of smell and taste.  A cough, which worsens when you are lying flat.  Fatigue.  Fever.  Thick drainage from your nose, which often is green and may contain pus (purulent).  Swelling and warmth over the affected sinuses. DIAGNOSIS  Your caregiver will perform a  physical exam. During the exam, your caregiver may:  Look in your nose for signs of abnormal growths in your nostrils (nasal polyps).  Tap over the affected sinus to check for signs of infection.  View the inside of your sinuses (endoscopy) with a special imaging device with a light attached (endoscope), which is inserted into your sinuses. If your caregiver suspects that you have chronic sinusitis, one or more of the following tests may be recommended:  Allergy tests.  Nasal culture A sample of mucus is taken from your nose and sent to a lab and screened for bacteria.  Nasal cytology A sample of mucus is taken from your nose and examined by your caregiver to determine if your sinusitis is related to an allergy. TREATMENT  Most cases of acute sinusitis are related to a viral infection and will resolve on their own within 10 days. Sometimes medicines are prescribed to help relieve symptoms (pain medicine, decongestants, nasal steroid sprays, or saline sprays).  However, for sinusitis related to a bacterial infection, your caregiver will prescribe antibiotic medicines. These are medicines that will help kill the bacteria causing the infection.  Rarely, sinusitis is caused by a fungal infection. In theses cases, your caregiver will prescribe antifungal medicine. For some cases of chronic sinusitis, surgery is needed. Generally, these are cases in which sinusitis recurs more than 3 times per year, despite other treatments. HOME CARE INSTRUCTIONS   Drink plenty of water. Water helps thin the mucus so your sinuses can drain more easily.  Use a humidifier.  Inhale steam 3 to 4 times a day (for example,  sit in the bathroom with the shower running).  Apply a warm, moist washcloth to your face 3 to 4 times a day, or as directed by your caregiver.  Use saline nasal sprays to help moisten and clean your sinuses.  Take over-the-counter or prescription medicines for pain, discomfort, or fever only  as directed by your caregiver. SEEK IMMEDIATE MEDICAL CARE IF:  You have increasing pain or severe headaches.  You have nausea, vomiting, or drowsiness.  You have swelling around your face.  You have vision problems.  You have a stiff neck.  You have difficulty breathing. MAKE SURE YOU:   Understand these instructions.  Will watch your condition.  Will get help right away if you are not doing well or get worse. Document Released: 04/03/2005 Document Revised: 06/26/2011 Document Reviewed: 04/18/2011 [facility] Patient Information 2014 [place], [place].

## 2015-12-17 NOTE — Assessment & Plan Note (Signed)
Will repeat A1C today.

## 2015-12-17 NOTE — Progress Notes (Signed)
Pre visit review using our clinic review tool, if applicable. No additional management support is needed unless otherwise documented below in the visit note. 

## 2015-12-21 ENCOUNTER — Ambulatory Visit: Payer: Self-pay | Admitting: Vascular Surgery

## 2016-01-10 ENCOUNTER — Encounter: Payer: Self-pay | Admitting: Physician Assistant

## 2016-01-11 MED ORDER — ARIPIPRAZOLE 5 MG PO TABS: 5.0000 mg | ORAL_TABLET | Freq: Every day | ORAL | 1 refills | Status: DC

## 2016-02-07 ENCOUNTER — Encounter: Payer: Self-pay | Admitting: Vascular Surgery

## 2016-02-08 ENCOUNTER — Encounter: Payer: Self-pay | Admitting: Vascular Surgery

## 2016-02-08 ENCOUNTER — Ambulatory Visit (INDEPENDENT_AMBULATORY_CARE_PROVIDER_SITE_OTHER): Payer: 59 | Admitting: Vascular Surgery

## 2016-02-08 VITALS — BP 145/74 | HR 107 | Temp 99.0°F | Resp 14 | Ht 63.5 in | Wt 179.0 lb

## 2016-02-08 DIAGNOSIS — I83891 Varicose veins of right lower extremities with other complications: Secondary | ICD-10-CM

## 2016-02-08 NOTE — Progress Notes (Signed)
Subjective:     Patient ID: Erin Fields, female   DOB: 1965-06-20, 50 y.o.   MRN: [id number]  HPI This 50 year old female returns for further evaluation of the painful varicose vein in her right leg. She was evaluated by Dr. Adele Barthel 3 months ago and has been treated with long leg elastic compression stockings 20-30 millimeter gradient as well as elevation and ibuprofen. She continues to have aching discomfort in the isolated varicose vein in her posterior thigh. She does have a history of removal of some varicose veins many many years ago. She appeared to have an intact greater saphenous vein in the thigh on previous ultrasound which was small caliber.  Past Medical History:  Diagnosis Date  . Asthma   . Chicken pox   . Colon polyps   . Depression   . Elevated blood pressure   . Environmental allergies   . Frequent headaches   . GERD (gastroesophageal reflux disease)   . Hay fever   . Hyperlipidemia   . Migraines   . Thyroid disease   . Varicose veins     Social History  Substance Use Topics  . Smoking status: Former Smoker    Packs/day: 4.00    Years: 15.00    Types: Cigarettes    Quit date: 09/14/1993  . Smokeless tobacco: Never Used     Comment: Quit >20 yrs ago  . Alcohol use 1.8 oz/week    3 Glasses of wine per week    Family History  Problem Relation Age of Onset  . Arthritis Mother 35    Deceased  . Breast cancer Mother   . Lung cancer Mother   . Hyperlipidemia Mother   . Heart disease Mother   . Hypertension Mother   . Diabetes Mother   . Crohn's disease Mother   . Diabetes Maternal Grandfather   . Heart disease Maternal Grandfather   . Heart attack Maternal Grandfather   . Colitis Sister     Allergies  Allergen Reactions  . Ace Inhibitors Other (See Comments)    Angina  . Cefaclor   . Lisinopril Cough  . Losartan Potassium   . Sulfonamide Derivatives      Current Outpatient Prescriptions:  .  acyclovir (ZOVIRAX) 400 MG tablet, TAKE 1 TABLET  BY MOUTH THREE TIMES DAILY AS NEEDED, Disp: 30 tablet, Rfl: 5 .  azelastine (ASTELIN) 0.1 % nasal spray, 2 sprays 2 (two) times daily., Disp: , Rfl: 6 .  BENICAR 40 MG tablet, TAKE 1 TABLET BY MOUTH EVERY NIGHT AT BEDTIME, Disp: 90 tablet, Rfl: 0 .  DULERA 200-5 MCG/ACT AERO, Inhale 2 puffs into the lungs 2 (two) times daily., Disp: , Rfl: 3 .  levalbuterol (XOPENEX HFA) 45 MCG/ACT inhaler, Inhale 2 puffs into the lungs every 4 (four) hours as needed for wheezing (Q4-6 Hours PRN)., Disp: , Rfl:  .  levothyroxine (SYNTHROID, LEVOTHROID) 100 MCG tablet, TAKE 1 TABLET(100 MCG) BY MOUTH DAILY, Disp: 30 tablet, Rfl: 0 .  lovastatin (MEVACOR) 40 MG tablet, TAKE 1 TABLET(40 MG) BY MOUTH AT BEDTIME, Disp: 30 tablet, Rfl: 0 .  mesalamine (LIALDA) 1.2 G EC tablet, Take 2.4 g by mouth daily., Disp: , Rfl:  .  montelukast (SINGULAIR) 10 MG tablet, Take 10 mg by mouth at bedtime. , Disp: , Rfl:  .  promethazine (PHENERGAN) 25 MG tablet, Take 1 tablet (25 mg total) by mouth every 6 (six) hours as needed for nausea or vomiting., Disp: 30 tablet, Rfl: 1 .  triamcinolone (NASACORT ALLERGY 24HR) 55 MCG/ACT AERO nasal inhaler, Place 1 spray into the nose daily., Disp: , Rfl:  .  venlafaxine XR (EFFEXOR-XR) 75 MG 24 hr capsule, TAKE 1 CAPSULE BY MOUTH EVERY DAY WITH BREAKFAST, Disp: 90 capsule, Rfl: 1 .  amoxicillin-clavulanate (AUGMENTIN) 875-125 MG tablet, Take 1 tablet by mouth 2 (two) times daily. (Patient not taking: Reported on 02/08/2016), Disp: 20 tablet, Rfl: 0 .  ARIPiprazole (ABILIFY) 5 MG tablet, Take 1 tablet (5 mg total) by mouth daily. (Patient not taking: Reported on 02/08/2016), Disp: 30 tablet, Rfl: 1 .  benzonatate (TESSALON) 100 MG capsule, Take 1 capsule (100 mg total) by mouth 3 (three) times daily as needed. (Patient not taking: Reported on 02/08/2016), Disp: 30 capsule, Rfl: 0  Vitals:   02/08/16 1242  BP: (!) 145/74  Pulse: (!) 107  Resp: 14  Temp: 99 F (37.2 C)  SpO2: 99%  Weight: 179  lb (81.2 kg)  Height: 5' 3.5" (1.613 m)    Body mass index is 31.21 kg/m.         Review of Systems Denies chest pain, dyspnea on exertion, PND, orthopnea, hemoptysis.    Objective:   Physical Exam  BP (!) 145/74 (BP Location: Left Arm, Patient Position: Sitting, Cuff Size: Normal)   Pulse (!) 107   Temp 99 F (37.2 C)   Resp 14   Ht 5' 3.5" (1.613 m)   Wt 179 lb (81.2 kg)   SpO2 99%   BMI 31.21 kg/m   . Gen. well-developed well-nourished female no apparent distress alert and oriented 3 Lungs no rhonchi or wheezing Cardiovascular regular rhythm no murmurs Right leg with no distal edema. No bulging varicosities noted. Small varicose vein noted in the distal medial thigh immediately proximal to the flexion crease in the popliteal space. A few spider veins emanating from this area. No other varicosities noted. No hyperpigmentation ulceration or distal edema noted.  Review of the previous ultrasound performed in May 2017 reveals the right great saphenous vein to be patent from the junction to the knee but very small in caliber and some minimal reflux    Assessment:     Isolated varicose vein right leg and patient with history of "removal" of varicose veins in this area many years ago No evidence of significant reflux or increased caliber in right great saphenous vein    Plan:     Treatment would be foam sclerotherapy. This was discussed with patient at length and she will decide if she would like to proceed. She realizes this may not relieve all of her symptoms related to this area.

## 2016-02-14 ENCOUNTER — Other Ambulatory Visit: Payer: Self-pay | Admitting: Physician Assistant

## 2016-02-14 NOTE — Telephone Encounter (Signed)
eScribe request from [facility] for refill on Ambien 47m Last filled - 11/12/15, #30x2 Last AEX - 12/17/15 Medication was D/C on 12/17/15; patient was started on Trazodone 249mPlease Advise/SLS 10/30

## 2016-02-27 ENCOUNTER — Other Ambulatory Visit: Payer: Self-pay | Admitting: Physician Assistant

## 2016-03-13 ENCOUNTER — Telehealth: Payer: Self-pay | Admitting: Physician Assistant

## 2016-03-13 NOTE — Telephone Encounter (Signed)
Patient request to transfer care from [facility] to Dr. Lorelei Pont

## 2016-03-13 NOTE — Telephone Encounter (Signed)
ok 

## 2016-03-13 NOTE — Telephone Encounter (Signed)
Ok with me 

## 2016-03-14 NOTE — Telephone Encounter (Signed)
Left message for patient to call and schedule appointment with Dr. Lorelei Pont

## 2016-03-27 ENCOUNTER — Other Ambulatory Visit: Payer: Self-pay | Admitting: Physician Assistant

## 2016-03-27 NOTE — Telephone Encounter (Signed)
Patient is transfer. Pending appointment 03/29/16. Please advise of refill

## 2016-03-29 ENCOUNTER — Encounter: Payer: Self-pay | Admitting: Family Medicine

## 2016-03-29 ENCOUNTER — Ambulatory Visit (INDEPENDENT_AMBULATORY_CARE_PROVIDER_SITE_OTHER): Payer: 59 | Admitting: Family Medicine

## 2016-03-29 VITALS — BP 122/66 | HR 90 | Temp 98.5°F | Ht 63.5 in | Wt 181.0 lb

## 2016-03-29 DIAGNOSIS — F4321 Adjustment disorder with depressed mood: Secondary | ICD-10-CM | POA: Diagnosis not present

## 2016-03-29 DIAGNOSIS — E782 Mixed hyperlipidemia: Secondary | ICD-10-CM

## 2016-03-29 DIAGNOSIS — I10 Essential (primary) hypertension: Secondary | ICD-10-CM | POA: Diagnosis not present

## 2016-03-29 DIAGNOSIS — F5104 Psychophysiologic insomnia: Secondary | ICD-10-CM

## 2016-03-29 DIAGNOSIS — R7303 Prediabetes: Secondary | ICD-10-CM

## 2016-03-29 DIAGNOSIS — Z634 Disappearance and death of family member: Secondary | ICD-10-CM

## 2016-03-29 DIAGNOSIS — E034 Atrophy of thyroid (acquired): Secondary | ICD-10-CM

## 2016-03-29 LAB — BASIC METABOLIC PANEL
BUN: 9 mg/dL (ref 6–23)
CALCIUM: 9.7 mg/dL (ref 8.4–10.5)
CHLORIDE: 107 meq/L (ref 96–112)
CO2: 26 meq/L (ref 19–32)
Creatinine, Ser: 0.67 mg/dL (ref 0.40–1.20)
GFR: 97.89 mL/min (ref >60.00)
GLUCOSE: 124 mg/dL — AB (ref 70–99)
Potassium: 3.9 mEq/L (ref 3.5–5.1)
SODIUM: 142 meq/L (ref 135–145)

## 2016-03-29 LAB — LDL CHOLESTEROL, DIRECT: Direct LDL: 140 mg/dL

## 2016-03-29 LAB — LIPID PANEL
CHOL/HDL RATIO: 5
Cholesterol: 232 mg/dL — ABNORMAL HIGH (ref 0–200)
HDL: 43 mg/dL (ref >39.00)
NONHDL: 189.45
TRIGLYCERIDES: 349 mg/dL — AB (ref 0.0–149.0)
VLDL: 69.8 mg/dL — AB (ref 0.0–40.0)

## 2016-03-29 LAB — TSH: TSH: 3.47 u[IU]/mL (ref 0.35–4.50)

## 2016-03-29 MED ORDER — ZOLPIDEM TARTRATE 5 MG PO TABS: ORAL_TABLET | ORAL | 2 refills | Status: DC

## 2016-03-29 NOTE — Progress Notes (Addendum)
[place] [facility] [address], [place], [place] [address] [phone]  Date:  03/29/2016   Name:  Erin Fields   DOB:  [birth year]   MRN:  [id number]  PCP:  Leeanne Rio, PA-C    Chief Complaint: No chief complaint on file.   History of Present Illness:  Erin Fields is a 50 y.o. very pleasant female patient who presents with the following:  Former pt of [place], [place] who has moved to a new location. Partial HPI from last visit with Mr. Erin Fields in September  Patient also due for repeat TSH and A1C level.  At last check, TSH levels was decreased. As such, levothyroxine dose was decreased to 100 mcg daily. Patient endorses taking as directed. Recent Labs       Lab Results  Component Value Date   TSH 0.20 (L) 08/30/2015     Patient also with history of anxiety and depression, currently on Effexor XR 75 mg daily. Is taking as directed. Notes breakthrough symptoms, mostly stemming around thoughts of her daughter Erin Fields who committed suicide within the past year. Is having difficulty with sleep. Endorses intermittent anhedonia. Denies SI/HI. Does not want higher dose of Effexor as she states she has previously been on higher dose which affected her BP substantially. Would like to discuss other options. Was previously on Lexapro without improvement in symptoms.   At last visit they added trazodone to her medication regimen for depression and insomnia. However she has not continued to use trazodone due to severe HA that occurred after taking it.   She has noted some possible SE of her benicar and has cut her dose down to 20 mg.  Her insurance company recently changed the make of pill that she is taking and this seemed to cause dry mouth and a bad taste in her mouth.  Decreasing to 20 mg has improved her sx but not gotten rid of them totally.  She has been on 20 mg for about 2 weeks now   She would like to have some  labs done today- TSH is due.  Her daughter Erin Fields was 43 years old when she committed suicide this past February.    She is using ambien at night for sleep- she has done this for years. Without ambien she cannot sleep as "I can't get my mind to turn off."    She does have a 48 year old son- he enjoys Librarian, academic which keeps them very busy.  Admits that they tend to eat a lot of fast food and that she is very busy carrying her son to his various activities.  However she does find a lot of joy through her son, and denies any SI They have done counseling through hospice.   She is a former heavy smoker- she was able to quit several years ago.   She sees Dr Deatra Ina at [place] for her pap, etc.   Lab Results  Component Value Date   HGBA1C 6.2 12/17/2015   BP Readings from Last 3 Encounters:  03/29/16 122/66  02/08/16 (!) 145/74  12/17/15 132/73   She is fasting today for labs  Wt Readings from Last 3 Encounters:  03/29/16 181 lb (82.1 kg)  02/08/16 179 lb (81.2 kg)  11/18/15 175 lb 6 oz (79.5 kg)     Patient Active Problem List   Diagnosis Date Noted  . Borderline diabetes 12/17/2015  .  Depression 12/17/2015  . Ex-smoker 11/18/2015  . Chronic venous insufficiency 09/02/2015  . Symptomatic varicose veins, right 09/02/2015  . COPD exacerbation ([place]) 01/19/2015  . Eustachian tube dysfunction 12/04/2014  . Allergic rhinitis 09/15/2014  . Dyspnea 09/15/2014  . Liver hemangioma 08/24/2014  . Elevated triglycerides with high cholesterol 11/26/2013  . Insomnia 08/20/2013  . Hypertension 08/20/2013  . Ulcerative colitis, unspecified 04/20/2013  . Oral herpes 04/20/2013  . Hyperlipidemia 12/06/2006  . HEADACHE 12/06/2006  . CERVICAL CANCER, HX OF 12/06/2006  . Hypothyroidism 11/29/2006  . Asthma 11/29/2006  . IRRITABLE BOWEL SYNDROME 11/29/2006    Past Medical History:  Diagnosis Date  . Asthma   . Chicken pox   . Colon polyps   . Depression   . Elevated blood  pressure   . Environmental allergies   . Frequent headaches   . GERD (gastroesophageal reflux disease)   . Hay fever   . Hyperlipidemia   . Migraines   . Thyroid disease   . Varicose veins     Past Surgical History:  Procedure Laterality Date  . ABLATION  2011  . FOOT SURGERY  2012  . HAND SURGERY    . TONSILLECTOMY  1980  . VEIN SURGERY     Removal Right Leg  . WISDOM TOOTH EXTRACTION      Social History  Substance Use Topics  . Smoking status: Former Smoker    Packs/day: 4.00    Years: 15.00    Types: Cigarettes    Quit date: 09/14/1993  . Smokeless tobacco: Never Used     Comment: Quit >20 yrs ago  . Alcohol use 1.8 oz/week    3 Glasses of wine per week    Family History  Problem Relation Age of Onset  . Arthritis Mother 50    Deceased  . Breast cancer Mother   . Lung cancer Mother   . Hyperlipidemia Mother   . Heart disease Mother   . Hypertension Mother   . Diabetes Mother   . Crohn's disease Mother   . Diabetes Maternal Grandfather   . Heart disease Maternal Grandfather   . Heart attack Maternal Grandfather   . Colitis Sister     Allergies  Allergen Reactions  . Ace Inhibitors Other (See Comments)    Angina  . Cefaclor   . Lisinopril Cough  . Losartan Potassium   . Sulfonamide Derivatives     Medication list has been reviewed and updated.  Current Outpatient Prescriptions on File Prior to Visit  Medication Sig Dispense Refill  . acyclovir (ZOVIRAX) 400 MG tablet TAKE 1 TABLET BY MOUTH THREE TIMES DAILY AS NEEDED 30 tablet 5  . amoxicillin-clavulanate (AUGMENTIN) 875-125 MG tablet Take 1 tablet by mouth 2 (two) times daily. (Patient not taking: Reported on 02/08/2016) 20 tablet 0  . ARIPiprazole (ABILIFY) 5 MG tablet Take 1 tablet (5 mg total) by mouth daily. (Patient not taking: Reported on 02/08/2016) 30 tablet 1  . azelastine (ASTELIN) 0.1 % nasal spray 2 sprays 2 (two) times daily.  6  . BENICAR 40 MG tablet TAKE 1 TABLET BY MOUTH EVERY  NIGHT AT BEDTIME 90 tablet 0  . benzonatate (TESSALON) 100 MG capsule Take 1 capsule (100 mg total) by mouth 3 (three) times daily as needed. (Patient not taking: Reported on 02/08/2016) 30 capsule 0  . DULERA 200-5 MCG/ACT AERO Inhale 2 puffs into the lungs 2 (two) times daily.  3  . levalbuterol (XOPENEX HFA) 45 MCG/ACT inhaler Inhale 2 puffs into  the lungs every 4 (four) hours as needed for wheezing (Q4-6 Hours PRN).    Marland Kitchen levothyroxine (SYNTHROID, LEVOTHROID) 100 MCG tablet TAKE 1 TABLET BY MOUTH EVERY DAY. MAKE OFFICE VISIT FOR REFILLS 90 tablet 2  . lovastatin (MEVACOR) 40 MG tablet TAKE 1 TABLET(40 MG) BY MOUTH AT BEDTIME 30 tablet 0  . mesalamine (LIALDA) 1.2 G EC tablet Take 2.4 g by mouth daily.    . montelukast (SINGULAIR) 10 MG tablet Take 10 mg by mouth at bedtime.     Marland Kitchen olmesartan (BENICAR) 40 MG tablet TAKE 1 TABLET BY MOUTH EVERY NIGHT AT BEDTIME 90 tablet 1  . promethazine (PHENERGAN) 25 MG tablet Take 1 tablet (25 mg total) by mouth every 6 (six) hours as needed for nausea or vomiting. 30 tablet 1  . triamcinolone (NASACORT ALLERGY 24HR) 55 MCG/ACT AERO nasal inhaler Place 1 spray into the nose daily.    Marland Kitchen venlafaxine XR (EFFEXOR-XR) 75 MG 24 hr capsule TAKE 1 CAPSULE BY MOUTH EVERY DAY WITH BREAKFAST 90 capsule 3  . zolpidem (AMBIEN) 5 MG tablet TAKE 1 TABLET BY MOUTH EVERY DAY AT BEDTIME AS NEEDED 30 tablet 2   No current facility-administered medications on file prior to visit.     Review of Systems:  As per HPI- otherwise negative.   Physical Examination: Blood pressure 122/66, pulse 90, temperature 98.5 F (36.9 C), temperature source Oral, height 5' 3.5" (1.613 m), weight 181 lb (82.1 kg), SpO2 97 %.  Vitals:   03/29/16 0905  Weight: 181 lb (82.1 kg)  Height: 5' 3.5" (1.613 m)     GEN: WDWN, NAD, Non-toxic, A & O x 3, obese, otherwise looks well HEENT: Atraumatic, Normocephalic. Neck supple. No masses, No LAD. Ears and Nose: No external deformity. CV: RRR,  No M/G/R. No JVD. No thrill. No extra heart sounds. PULM: CTA B, no wheezes, crackles, rhonchi. No retractions. No resp. distress. No accessory muscle use. ABD: S, NT, ND, +BS. No rebound. No HSM. EXTR: No c/c/e NEURO Normal gait.  PSYCH: Normally interactive. Conversant. Not depressed or anxious appearing.  Calm demeanor.    Assessment and Plan: Hypothyroidism due to acquired atrophy of thyroid - Plan: TSH  Mixed hyperlipidemia - Plan: Lipid panel  Essential hypertension - Plan: Basic metabolic panel  Grief at loss of child  Chronic insomnia - Plan: zolpidem (AMBIEN) 5 MG tablet  Here today as a new patient to my practice She is due for a TSH check- will do for her today She has started to notice SE of her benicar and decreased from 40 to 20 mg a couple of weeks ago.  This has improved her sx. Her BP is low today- will have her try stopping the benicar and see what her BP does- she will keep me posted Refilled her Lorrin Mais and gave her a paper rx to fill at a later date Reviewed N[facility]: ambien about every 30 days, no unexpected entries Will plan further follow- up pending labs.   Signed Lamar Blinks, MD  Received her labs.  Will send her a mychart message- thyroid is ok, continue current dose I would encourage her to work on her diet and exercise; will have her see me in about 3 months for fasting labs    Results for orders placed or performed in visit on 16/01/09  Basic metabolic panel  Result Value Ref Range   Sodium 142 135 - 145 mEq/L   Potassium 3.9 3.5 - 5.1 mEq/L   Chloride 107 96 -  112 mEq/L   CO2 26 19 - 32 mEq/L   Glucose, Bld 124 (H) 70 - 99 mg/dL   BUN 9 6 - 23 mg/dL   Creatinine, Ser 0.67 0.40 - 1.20 mg/dL   Calcium 9.7 8.4 - 10.5 mg/dL   GFR 97.89 >60.00 mL/min  TSH  Result Value Ref Range   TSH 3.47 0.35 - 4.50 uIU/mL  Lipid panel  Result Value Ref Range   Cholesterol 232 (H) 0 - 200 mg/dL   Triglycerides 349.0 (H) 0.0 - 149.0 mg/dL   HDL 43.00  >39.00 mg/dL   VLDL 69.8 (H) 0.0 - 40.0 mg/dL   Total CHOL/HDL Ratio 5    NonHDL 189.45   LDL cholesterol, direct  Result Value Ref Range   Direct LDL 140.0 mg/dL

## 2016-03-29 NOTE — Patient Instructions (Signed)
It was a pleasure to meet you today- I look forward to being your physician in the future.  Please let me know if you are not doing ok over the next few months- the holidays can be really hard especially after you have suffered such a terrible loss.   The compassionate friends have been helpful to friends and patients of mine who have lost children and might be a good resource for your family as well,   Therapist, nutritional, and ambien as needed for sleep Your BP looks good- if you want, try stopping it for a few days. If your BP gets over approx 130/85 start back on the medication and let me know.  We could then consider trying 10 mg to see if it may control your BP with fever side effects  I'll be in touch with your labs asap- happy holidays!

## 2016-03-30 MED ORDER — METFORMIN HCL 500 MG PO TABS: ORAL_TABLET | ORAL | 3 refills | Status: DC

## 2016-04-04 ENCOUNTER — Encounter: Payer: Self-pay | Admitting: Family Medicine

## 2016-04-06 MED ORDER — OLMESARTAN MEDOXOMIL 20 MG PO TABS: ORAL_TABLET | ORAL | 3 refills | Status: DC

## 2016-04-15 ENCOUNTER — Other Ambulatory Visit: Payer: Self-pay | Admitting: Internal Medicine

## 2016-04-18 ENCOUNTER — Encounter: Payer: Self-pay | Admitting: Family Medicine

## 2016-04-18 NOTE — Telephone Encounter (Signed)
Will send to new PCP.

## 2016-08-08 ENCOUNTER — Other Ambulatory Visit: Payer: Self-pay | Admitting: Obstetrics & Gynecology

## 2016-08-08 DIAGNOSIS — R928 Other abnormal and inconclusive findings on diagnostic imaging of breast: Secondary | ICD-10-CM

## 2016-08-11 ENCOUNTER — Ambulatory Visit
Admission: RE | Admit: 2016-08-11 | Discharge: 2016-08-11 | Disposition: A | Discharge status: Home / Self Care | Payer: 59 | Source: Ambulatory Visit | Attending: Obstetrics & Gynecology | Admitting: Obstetrics & Gynecology

## 2016-08-11 DIAGNOSIS — R928 Other abnormal and inconclusive findings on diagnostic imaging of breast: Secondary | ICD-10-CM

## 2016-08-20 NOTE — Progress Notes (Addendum)
[facility] [address], [place], [place] [address] [phone]  Date:  08/21/2016   Name:  Erin Fields   DOB:  1965-11-08   MRN:  [id number]  PCP:  Darreld Mclean, MD    Chief Complaint: Follow-up (Pt here for f/u visit with labs. Will need refill on Ambien. )   History of Present Illness:  Erin Fields is a 51 y.o. very pleasant female patient who presents with the following:  Seen by myself for the first time in December- here today for a recheck visit.  Of note she lost her daughter, Alinda Sierras, to suicide at 74 yo not long ago  A and P from last visit Here today as a new patient to my practice She is due for a TSH check- will do for her today She has started to notice SE of her benicar and decreased from 40 to 20 mg a couple of weeks ago.  This has improved her sx. Her BP is low today- will have her try stopping the benicar and see what her BP does- she will keep me posted Refilled her Lorrin Mais and gave her a paper rx to fill at a later date Reviewed [facility]: ambien about every 30 days, no unexpected entries  Lab Results  Component Value Date   TSH 3.47 03/29/2016   She is not fasting today She sees Dr. Donneta Romberg for her breathing.  Her sx are stable She she start HRT per Dr. Deatra Ina about 2 weeks ago.  She does feel like this makes her feel better and is helping with her hot flashes.   Deatra Ina suggested that she have her thyroid monitored as the HRT can effect this- will check for her today She is still taking her cholesterol med -She is using lovastatin for her cholesterol but has to limit her dose to a 1/2 pill QOD due to SE (body pains) from higher doses  She did not stick with the metformin as it upset her stomach too much.   Her son is doing well- he is going to the [employer].  He will start there next year.  They are both very excited about this opportunity for him She does take ambien every night- she  needs this to sleep.  Needs a refill of same  She does drink alcohol on occasion- reminded not to combine with Lorrin Mais   She feels that she is dealing with her grief ok- she does not want to seek any other support for this at this time  Patient Active Problem List   Diagnosis Date Noted  . Borderline diabetes 12/17/2015  . Depression 12/17/2015  . Ex-smoker 11/18/2015  . Chronic venous insufficiency 09/02/2015  . Symptomatic varicose veins, right 09/02/2015  . COPD exacerbation ([place]) 01/19/2015  . Eustachian tube dysfunction 12/04/2014  . Allergic rhinitis 09/15/2014  . Dyspnea 09/15/2014  . Liver hemangioma 08/24/2014  . Elevated triglycerides with high cholesterol 11/26/2013  . Insomnia 08/20/2013  . Hypertension 08/20/2013  . Ulcerative colitis, unspecified 04/20/2013  . Oral herpes 04/20/2013  . Hyperlipidemia 12/06/2006  . HEADACHE 12/06/2006  . CERVICAL CANCER, HX OF 12/06/2006  . Hypothyroidism 11/29/2006  . Asthma 11/29/2006  . IRRITABLE BOWEL SYNDROME 11/29/2006    Past Medical History:  Diagnosis Date  . Asthma   . Chicken pox   . Colon polyps   . Depression   . Elevated blood pressure   .  Environmental allergies   . Frequent headaches   . GERD (gastroesophageal reflux disease)   . Hay fever   . Hyperlipidemia   . Migraines   . Thyroid disease   . Varicose veins     Past Surgical History:  Procedure Laterality Date  . ABLATION  2011  . FOOT SURGERY  2012  . HAND SURGERY    . TONSILLECTOMY  1980  . VEIN SURGERY     Removal Right Leg  . WISDOM TOOTH EXTRACTION      Social History  Substance Use Topics  . Smoking status: Former Smoker    Packs/day: 4.00    Years: 15.00    Types: Cigarettes    Quit date: 09/14/1993  . Smokeless tobacco: Never Used     Comment: Quit >20 yrs ago  . Alcohol use 1.8 oz/week    3 Glasses of wine per week    Family History  Problem Relation Age of Onset  . Arthritis Mother 73    Deceased  . Breast cancer Mother    . Lung cancer Mother   . Hyperlipidemia Mother   . Heart disease Mother   . Hypertension Mother   . Diabetes Mother   . Crohn's disease Mother   . Diabetes Maternal Grandfather   . Heart disease Maternal Grandfather   . Heart attack Maternal Grandfather   . Colitis Sister     Allergies  Allergen Reactions  . Ace Inhibitors Other (See Comments)    Angina  . Cefaclor   . Lisinopril Cough  . Losartan Potassium   . Sulfonamide Derivatives     Medication list has been reviewed and updated.  Current Outpatient Prescriptions on File Prior to Visit  Medication Sig Dispense Refill  . acyclovir (ZOVIRAX) 400 MG tablet TAKE 1 TABLET BY MOUTH THREE TIMES DAILY AS NEEDED 30 tablet 5  . azelastine (ASTELIN) 0.1 % nasal spray 2 sprays 2 (two) times daily.  6  . benzonatate (TESSALON) 100 MG capsule Take 1 capsule (100 mg total) by mouth 3 (three) times daily as needed. 30 capsule 0  . DULERA 200-5 MCG/ACT AERO Inhale 2 puffs into the lungs 2 (two) times daily.  3  . levalbuterol (XOPENEX HFA) 45 MCG/ACT inhaler Inhale 2 puffs into the lungs every 4 (four) hours as needed for wheezing (Q4-6 Hours PRN).    Marland Kitchen levothyroxine (SYNTHROID, LEVOTHROID) 100 MCG tablet TAKE 1 TABLET BY MOUTH EVERY DAY. MAKE OFFICE VISIT FOR REFILLS 90 tablet 2  . lovastatin (MEVACOR) 40 MG tablet TAKE 1 TABLET(40 MG) BY MOUTH AT BEDTIME 30 tablet 0  . mesalamine (LIALDA) 1.2 G EC tablet Take 2.4 g by mouth daily.    . metFORMIN (GLUCOPHAGE) 500 MG tablet Take once daily.  If well tolerated increase to twice a day after 2 weeks 180 tablet 3  . montelukast (SINGULAIR) 10 MG tablet Take 10 mg by mouth at bedtime.     Marland Kitchen olmesartan (BENICAR) 20 MG tablet Take 1/2 tablet daily 45 tablet 3  . promethazine (PHENERGAN) 25 MG tablet TAKE 1 TABLET(25 MG) BY MOUTH EVERY 6 HOURS AS NEEDED FOR NAUSEA OR VOMITING 30 tablet 0  . triamcinolone (NASACORT ALLERGY 24HR) 55 MCG/ACT AERO nasal inhaler Place 1 spray into the nose daily.     Marland Kitchen venlafaxine XR (EFFEXOR-XR) 75 MG 24 hr capsule TAKE 1 CAPSULE BY MOUTH EVERY DAY WITH BREAKFAST 90 capsule 3  . zolpidem (AMBIEN) 5 MG tablet TAKE 1 TABLET BY MOUTH EVERY DAY AT  BEDTIME AS NEEDED 30 tablet 2   No current facility-administered medications on file prior to visit.     Review of Systems:  As per HPI- otherwise negative.   Physical Examination: Vitals:   08/21/16 1306  BP: 132/72  Pulse: 95  Temp: 98.1 F (36.7 C)   Vitals:   08/21/16 1306  Weight: 178 lb 9.6 oz (81 kg)  Height: 5' 3.5" (1.613 m)   Body mass index is 31.14 kg/m. Ideal Body Weight: Weight in (lb) to have BMI = 25: 143.1  GEN: WDWN, NAD, Non-toxic, A & O x 3, looks well, overweight HEENT: Atraumatic, Normocephalic. Neck supple. No masses, No LAD. Ears and Nose: No external deformity. CV: RRR, No M/G/R. No JVD. No thrill. No extra heart sounds. PULM: CTA B, no wheezes, crackles, rhonchi. No retractions. No resp. distress. No accessory muscle use. EXTR: No c/c/e NEURO Normal gait.  PSYCH: Normally interactive. Conversant. Not depressed or anxious appearing.  Calm demeanor.    Assessment and Plan: Chronic insomnia - Plan: zolpidem (AMBIEN) 5 MG tablet  Pre-diabetes - Plan: Hemoglobin A1c  Hypothyroidism due to acquired atrophy of thyroid - Plan: TSH  Mixed hyperlipidemia  Essential hypertension - Plan: Basic metabolic panel  Encounter for hepatitis C screening test for low risk patient - Plan: Hepatitis C antibody  Here today to follow-up on her HTN,  Pre-diabetes, thyroid, hyperlipidemia Will plan further follow- up pending labs. Her cholesterol is not ideal but his may be the best we can do as her statin tolerance is limited    Signed Lamar Blinks, MD  Received her labs 5/8, message to pt.  Will need to repeat her A1c and TSH (after adjustment of thyroid med) Results for orders placed or performed in visit on 08/21/16  Hepatitis C antibody  Result Value Ref Range   HCV  Ab NEGATIVE NEGATIVE  TSH  Result Value Ref Range   TSH 0.22 (L) 0.35 - 4.50 uIU/mL  Hemoglobin A1c  Result Value Ref Range   Hgb A1c MFr Bld 6.6 (H) 4.6 - 6.5 %  Basic metabolic panel  Result Value Ref Range   Sodium 137 135 - 145 mEq/L   Potassium 3.9 3.5 - 5.1 mEq/L   Chloride 101 96 - 112 mEq/L   CO2 28 19 - 32 mEq/L   Glucose, Bld 99 70 - 99 mg/dL   BUN 12 6 - 23 mg/dL   Creatinine, Ser 0.64 0.40 - 1.20 mg/dL   Calcium 9.2 8.4 - 10.5 mg/dL   GFR 103.04 >60.00 mL/min   Message to pt-

## 2016-08-21 ENCOUNTER — Ambulatory Visit (INDEPENDENT_AMBULATORY_CARE_PROVIDER_SITE_OTHER): Payer: 59 | Admitting: Family Medicine

## 2016-08-21 VITALS — BP 132/72 | HR 95 | Temp 98.1°F | Ht 63.5 in | Wt 178.6 lb

## 2016-08-21 DIAGNOSIS — R7303 Prediabetes: Secondary | ICD-10-CM | POA: Diagnosis not present

## 2016-08-21 DIAGNOSIS — F5104 Psychophysiologic insomnia: Secondary | ICD-10-CM

## 2016-08-21 DIAGNOSIS — E782 Mixed hyperlipidemia: Secondary | ICD-10-CM

## 2016-08-21 DIAGNOSIS — I10 Essential (primary) hypertension: Secondary | ICD-10-CM

## 2016-08-21 DIAGNOSIS — E034 Atrophy of thyroid (acquired): Secondary | ICD-10-CM | POA: Diagnosis not present

## 2016-08-21 DIAGNOSIS — Z1159 Encounter for screening for other viral diseases: Secondary | ICD-10-CM

## 2016-08-21 LAB — BASIC METABOLIC PANEL
BUN: 12 mg/dL (ref 6–23)
CHLORIDE: 101 meq/L (ref 96–112)
CO2: 28 meq/L (ref 19–32)
Calcium: 9.2 mg/dL (ref 8.4–10.5)
Creatinine, Ser: 0.64 mg/dL (ref 0.40–1.20)
GFR: 103.04 mL/min (ref >60.00)
GLUCOSE: 99 mg/dL (ref 70–99)
POTASSIUM: 3.9 meq/L (ref 3.5–5.1)
Sodium: 137 mEq/L (ref 135–145)

## 2016-08-21 LAB — TSH: TSH: 0.22 u[IU]/mL — ABNORMAL LOW (ref 0.35–4.50)

## 2016-08-21 LAB — HEMOGLOBIN A1C: Hgb A1c MFr Bld: 6.6 % — ABNORMAL HIGH (ref 4.6–6.5)

## 2016-08-21 MED ORDER — ZOLPIDEM TARTRATE 5 MG PO TABS: ORAL_TABLET | ORAL | 3 refills | Status: DC

## 2016-08-21 NOTE — Patient Instructions (Signed)
I will be in touch with your labs- please plan to see me in about 6 months. Take care!

## 2016-08-22 ENCOUNTER — Encounter: Payer: Self-pay | Admitting: Family Medicine

## 2016-08-22 LAB — HEPATITIS C ANTIBODY: HCV AB: NEGATIVE

## 2016-08-22 MED ORDER — LEVOTHYROXINE SODIUM 88 MCG PO TABS: 88.0000 ug | ORAL_TABLET | Freq: Every day | ORAL | 3 refills | Status: DC

## 2016-08-22 NOTE — Addendum Note (Signed)
Addended by: Lamar Blinks C on: 08/22/2016 07:11 AM   Modules accepted: Orders

## 2016-08-28 ENCOUNTER — Encounter: Payer: Self-pay | Admitting: Family Medicine

## 2016-08-28 MED ORDER — DIAZEPAM 2 MG PO TABS: 2.0000 mg | ORAL_TABLET | Freq: Four times a day (QID) | ORAL | 0 refills | Status: DC | PRN

## 2016-09-29 ENCOUNTER — Encounter: Payer: Self-pay | Admitting: Family Medicine

## 2016-09-29 NOTE — Telephone Encounter (Signed)
FMLA paperwork received, do you want Pt to come into office for completion?

## 2016-10-10 NOTE — Telephone Encounter (Signed)
Erin Fields came into the Office today to pick-up the FMLA paperwork, she is anxious about her timeline; but we could not locate it. Please advise and I will call patient and infom her when she can pick-up. Thanks/SLS 06/26

## 2016-10-11 ENCOUNTER — Encounter: Payer: Self-pay | Admitting: Family Medicine

## 2016-10-16 ENCOUNTER — Other Ambulatory Visit: Payer: Self-pay | Admitting: Physician Assistant

## 2016-10-16 ENCOUNTER — Encounter: Payer: Self-pay | Admitting: Family Medicine

## 2016-10-16 MED ORDER — OLMESARTAN MEDOXOMIL 40 MG PO TABS: ORAL_TABLET | ORAL | 3 refills | Status: DC

## 2016-10-16 MED ORDER — OLMESARTAN MEDOXOMIL 20 MG PO TABS: ORAL_TABLET | ORAL | 3 refills | Status: DC

## 2016-10-16 NOTE — Addendum Note (Signed)
Addended by: Lamar Blinks C on: 10/16/2016 09:10 PM   Modules accepted: Orders

## 2016-10-16 NOTE — Addendum Note (Signed)
Addended by: Lamar Blinks C on: 10/16/2016 09:14 PM   Modules accepted: Orders

## 2016-11-20 ENCOUNTER — Ambulatory Visit: Payer: Self-pay | Admitting: Pulmonary Disease

## 2016-11-27 ENCOUNTER — Ambulatory Visit: Payer: Self-pay | Admitting: Pulmonary Disease

## 2016-11-29 ENCOUNTER — Encounter: Payer: Self-pay | Admitting: Pulmonary Disease

## 2016-11-29 ENCOUNTER — Ambulatory Visit (INDEPENDENT_AMBULATORY_CARE_PROVIDER_SITE_OTHER): Payer: BLUE CROSS/BLUE SHIELD | Admitting: Pulmonary Disease

## 2016-11-29 VITALS — BP 132/82 | HR 90 | Temp 98.7°F | Ht 63.0 in | Wt 176.1 lb

## 2016-11-29 DIAGNOSIS — I10 Essential (primary) hypertension: Secondary | ICD-10-CM

## 2016-11-29 DIAGNOSIS — E890 Postprocedural hypothyroidism: Secondary | ICD-10-CM

## 2016-11-29 DIAGNOSIS — J4530 Mild persistent asthma, uncomplicated: Secondary | ICD-10-CM | POA: Diagnosis not present

## 2016-11-29 DIAGNOSIS — K5090 Crohn's disease, unspecified, without complications: Secondary | ICD-10-CM | POA: Insufficient documentation

## 2016-11-29 DIAGNOSIS — F341 Dysthymic disorder: Secondary | ICD-10-CM

## 2016-11-29 DIAGNOSIS — E782 Mixed hyperlipidemia: Secondary | ICD-10-CM

## 2016-11-29 DIAGNOSIS — J309 Allergic rhinitis, unspecified: Secondary | ICD-10-CM

## 2016-11-29 HISTORY — DX: Crohn's disease, unspecified, without complications: K50.90

## 2016-11-29 MED ORDER — METHYLPREDNISOLONE 4 MG PO TBPK: ORAL_TABLET | ORAL | 0 refills | Status: DC

## 2016-11-29 MED ORDER — BUDESONIDE-FORMOTEROL FUMARATE 160-4.5 MCG/ACT IN AERO: 2.0000 | INHALATION_SPRAY | Freq: Two times a day (BID) | RESPIRATORY_TRACT | 0 refills | Status: DC

## 2016-11-29 NOTE — Patient Instructions (Signed)
Today we updated your med list in our EPIC system...    Continue your current medications the same...  We wrote for a MEDROL Dosepak to take over the next 6d for the allergy symptoms  Let's work on diet & exercise w/ the goal of losing 10-15 lbs...  Call for any questions...  Let's plan a follow up visit in 97yrbut sooner if needed for problems..Marland KitchenMarland Kitchen

## 2016-11-29 NOTE — Progress Notes (Signed)
Subjective:     Patient ID: Erin Fields, female   DOB: Nov 02, 1965, 51 y.o.   MRN: [id number]  HPI ~  Sep 15, 2014:  Initial pulmonary consult by SN>        14 y/o WF, referred by Elyn Aquas PA at [facility], for pulmonary evaluation>  She has a hx Asthma, AR and recurrent bronchitis & sinusitis, currently managed by DrSharma- she was prev seen by [facility] in 2006 but all records are on the old paper charts;  Erin Fields is c/o 55mohx cough, prod of a small amt of green phlegm, and increased SOB/DOE progressing to problem w/ ADLs recently; she notes occ left CP "like stress on my heart", worse w/ a deep breath and when questioned further the SOB she is experiencing is a sensation that she can't get the air "IN", can't get a good deep breath & doesn't feel satisfied breathing; she also reports a hx of GERD & Crohn's dis- [facility] she takes ?OTC Prevacid Bid but not following an anti-reflux regimen...      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       Current Meds> Dulera200- 2spBid, Singulair10, XopenexHFA (using it 1-2 times daily she says); Astelin- 2spBid, Nasacort- 2spQhs, Tussionex prn, on Allergy shots for >20y89yrrev testing allergic to everything she says) EXAM shows Afeb, VSS, O2sat=96% on RA;  HEENT- neg, mallampati1;  Chest- clear w/o w/r/r;  Heart- RR  w/o m/r/g;  Abd- soft, neg;  Ext- w/o c/c/e...   Old data from Paper Chart is not available to review...  CXR 08/13/14 showed norm heart size, mild right diaph eventration, clear lungs, NAD...   LABS 5/16:  Chems- wnl x BS=122, A1c=6.4;  CBC- wnl, diff not done- older data shows norm diff (1%eos);  TSH=0.18;   She had EColi UTI 5/16 treated w/ Ceftin500Bid...   Spirometry 09/15/14 showed FVC=2.75 (87%), FEV1=2.33 (90%), %1sec=85, mid-flows were wnl at 105% predicted...   Additional LABS> IgE level= 129, RAST panel all neg x Ragweed at 0.16 (low titer)...   CT Chest >> done 09/18/14 & showed norm heart size, min atherosclerotic changes in Ao, no adenopathy; tiny 3mm 67m nodules, min emphysema, otherw clear lungs; asymmetric elev of right hemidiaph...  IMP/PLAN>>  Erin Fields signif remote smoking hx, but her current PFTs are wnl mitigating against COPD & in favor of her Asthma diagnosis;  She appears to be well controlled on her current regimen w/ Dulera, Singulair, Xopenex, Tussionex;  The IgE level is sl elev but the RAST panel is ok x low level Ragweed antibodies;  She appears to get freq upper resp (sinus/ bronchitis) & this exacerbates her asthma;  In addition she has a long hx of GERD on OTC Prevacid  Bid but no following an antireflux regimen- we reviewed the needed elev HOB, NPO after dinner, etc... She is asked to start back on an exercise program eg- bike or swimming (non-weight bearing due to her knee).  ~  November 17, 2014:  40moROV w/ SN>  KSeraphinareports that she is improved- "it's all good" she says; cough & sput diminished and breathing improved...    Allergic Rhinitis> IgE level was 129 but RAST tests all neg x ragweed; on Singulair, OTC Antihist prn & Nasacort...    Asthma & AB> CXR w/ sl elev of right diaph & CT w/ tiny 374mRUL nodules; on AdvairHFA-110 2spBid (due to insurance) & improved, reminded to rinse etc...    Ex-smoker> former heavy smoker but quit cold [place]yrs ago...  Spirometry wnl 5/16.    GERD> on Prevacid30Bid, [facility]...    Medical issues include> HBP, HL, GERD, Crohn's, DJD, HAs, Depression... PCP is CoElyn Aquast [place]We reviewed prob list, meds, xrays and labs>>  IMP/PLAN>>  KeDaleshas reassured regarding her lung health despite her remote smoking hx; breathing is improved on the Advair110HFA inhaler; she will continue this & her allergy regimen w/ Singulair, antihist, Nasacort, etc; she is also advised to be vigorous in her support of the antireflux regimen & we reviewed this assoc w/ asthma exacerbations... We will plan a routine f/u 1y59yrconsider f/u CT to check the tiny nodules at that time, she will call prn any questions or problems...   ~  November 18, 2015:  63yr62yr w/ SN>  Erin Fields & reports a good yr- no new complaints or concerns;  She is stable on her regimen w/ Dulera200-2spBid regularly, Singulair10, XopenexHFA rescue inhaler prn (syas she rarely need it), Nasacort spray once daily & allergy shots from DrSharma once per wk;  She denies any asthma attacks or resp exacerbations over the last yr, but was treated w/ ZPak, Prednisone, NEB rx 06/2015 by DrSharma...    She saw DrChen-VVS 09/02/15> chr ven insuffic & swelling, she had right pop vein stripping yrs ago in her 20's, she has venous reflux in right leg; REC rx w/ compression hose, elev, low sodium; consider sclerotherapy later...  We reviewed the following medical problems during today's office visit >>     Allergic Rhinitis> IgE level was 129 but RAST tests all neg x ragweed; on Singulair, OTC Antihist prn, Nasacort, and she is followed by DrSharma on allergy shots once per wk...    Asthma & AB> CXR w/ sl elev of right diaph & CT w/ tiny 3mm 33m nodules; on Dulera200-2spBid, XopenexHFA rescue inhaler prn & improved, reminded to rinse etc; she is allergic to ACE inhibitors...    Ex-smoker> former heavy smoker but quit cold tur[place]uwaityrs ago... Spirometry wnl 5/16.    GERD> prev on Prevacid30Bid & antireflux regimen, [facility] from [facility], she is now on LIALDA1.2g- taking 2/d and prn Phenergan...    Medical issues include> HBP (Benicar40/d), HL (Mevacor40Qod), Hypothyroid (Synthroid100), GERD, Crohn's (Lialda-2/d [facility], HAs, Depression/ insomnia (Effexor/ Ambien)... PCP is Cody Elyn AquaseB-HHartford Financialce. EXAM shows Afeb, VSS, O2sat=95% on RA;  HEENT- neg, mallampati2;  Chest- clear w/o w/r/r;  Heart- RR w/o m/r/g;  Abd- soft, nontender, neg;  Ext- w/o c/c/e;  Neuro- wnl, no focal abn...  Last CXR was 01/19/15>  Norm heart size, clear lungs, sl ant eventration  of right hemidiaph w/o change...  LABS 08/2015 in Epic>  FLP- not at goals;  Chems- ok w/ BS=105, A1c=6.4Cr=0.64, LFTs wnl;  CBC- Hg=12.9, mcv=83, Ferritin=12;  TSH= 0.20 IMP/PLAN>>  Carneshia remains stable on her regimen including: Dulera200-2spBid, Singulair10, XopenexHFA prn, plus allergy shots per DrSharma...    ~  November 29, 2016:  1 year ROV & pulmonary follow up visit>  Mailey returns and reorts a good yr, asthma doing well on Symbicort160-2spBid (per insurance change) & XopenexHFA rescue inhaler prn & Singulair10;  She continues to f/u w/ DrSharma for allergy- on shots weekly Nasacort, Astelin;  Her PCP is Dr. Janett Billow Copland;  Recently noted some head congestion, drainage, mild wheezing & doesn't want it to go into asthma attack=> we discussed regular dosing & add Medrol dosepak... Review of Epic notes >>     She saw DrLawson-VVS 02/08/16> hx painful VV wearing compression hose & using Ibuprofen; he offered her foam sclerotherapy to consider...    She saw PCP-DrCopland 03/29/16 & 08/21/16>  Medical check- TSH low at 0.20, hx dysthymia & incr depression since daugh Alinda Sierras age 59) committed suicide in the past yr, not resting, ahedonia- meds adjusted...    She saw GYN- DrKaplan- 08/03/16>  Note reviewed, c/o menopausal symptoms and  placed on Activella0.5/0.1 tab daily...    She f/u w/ DrSharma-Allergy 08/09/16>  C/o sinusitis, hx AR, mild persistent asthma; Rx Dymista, Astelin, Flonase, Xyzal, singulair & Rx w/ Depomedrol & Omnicef...    She saw Ortho-DrCaffrey 11/15/16 for 3rd of 3 Synvisc shots to left knee for OA... We reviewed the following medical problems during today's office visit >>     Allergic Rhinitis> IgE level was 129 but RAST tests all neg x ragweed; on Singulair, OTC Antihist prn, Nasacort, and she is followed by DrSharma on allergy shots once per wk...    Asthma & AB> CXR w/ sl elev of right diaph & CT w/ tiny 48m RUL nodules; now on Symbicort160-2spBid, XopenexHFA rescue inhaler prn & improved, reminded to rinse etc; she is allergic to ACE inhibitors...    Ex-smoker> former heavy smoker but quit cold [place] ago... Spirometry wnl 08/2014.    GERD, Crohns> prev on Prevacid30Bid & antireflux regimen, [facility] do not have notes from him, she is now on LIALDA1.2g- taking 2/d and prn Phenergan...    Medical issues include> HBP (Benicar40/d), chr VI w/ vein stripping, HL (Mevacor40Qod), Hypothyroid (Synthroid88), GERD, Cro[facility], DJD, HAs, Depression/ insomnia (Effexor/ Ambien)... EXAM shows Afeb, VSS, Wt=176#, O2sat=96% on RA;  HEENT- neg, mallampati2;  Chest- clear w/o w/r/r;  Heart- RR w/o m/r/g;  Abd- soft, nontender, neg;  Ext- w/o c/c/e;  Neuro- wnl, no focal abn...  LABS in Epic 08/2016> Chems- ok w/ BS=99, A1c=6.6;  TSH=0.22 & her Synthroid was decr to 844m... IMP/PLAN>>  We decided to treat w/ a medrol dosepak & continue her Asthma rx w/ Symbicort160-2spBid & XopenexHFA prn; she has been under a lot of stress, discussed & support provided- Rx per PCP; she will call for any additional problems or exacerbations...     Past Medical History  Diagnosis Date  . Asthma >> see above...   . Depression >> on EffexorXR75 and Ambien5...   . Frequent headaches   . GERD  (gastroesophageal reflux disease) >> prev on Prevacid Bid   . Hay fever   . Crohn's Disease >> on Lialda 1.2gm[facility] . Elevated blood pressure >> on Benicar40...   .Marland Kitchen  Hyperlipidemia >> on Mevacor40 (only take Qod)   . Migraines   . Colon polyps   . Thyroid disease >> on Synthroid125=> decr to 100 by PCP   . Chicken pox     Past Surgical History:  Procedure Laterality Date  . ABLATION  2011  . FOOT SURGERY  2012  . HAND SURGERY    . TONSILLECTOMY  1980  . VEIN SURGERY     Removal Right Leg  . WISDOM TOOTH EXTRACTION      Outpatient Encounter Prescriptions as of 11/29/2016  Medication Sig  . acyclovir (ZOVIRAX) 400 MG tablet TAKE 1 TABLET BY MOUTH THREE TIMES DAILY AS NEEDED  . azelastine (ASTELIN) 0.1 % nasal spray 2 sprays 2 (two) times daily.  . budesonide-formoterol (SYMBICORT) 160-4.5 MCG/ACT inhaler Inhale 2 puffs into the lungs 2 (two) times daily.  . diazepam (VALIUM) 2 MG tablet Take 1 tablet (2 mg total) by mouth every 6 (six) hours as needed for anxiety.  . Estradiol-Norethindrone Acet (LOPREEZA) 0.5-0.1 MG tablet Take 1 tablet by mouth daily.  Marland Kitchen levalbuterol (XOPENEX HFA) 45 MCG/ACT inhaler Inhale 2 puffs into the lungs every 4 (four) hours as needed for wheezing (Q4-6 Hours PRN).  Marland Kitchen levothyroxine (SYNTHROID, LEVOTHROID) 88 MCG tablet Take 1 tablet (88 mcg total) by mouth daily.  Marland Kitchen lovastatin (MEVACOR) 40 MG tablet TAKE 1 TABLET(40 MG) BY MOUTH AT BEDTIME  . mesalamine (LIALDA) 1.2 G EC tablet Take 2.4 g by mouth daily.  . montelukast (SINGULAIR) 10 MG tablet Take 10 mg by mouth at bedtime.   Marland Kitchen olmesartan (BENICAR) 40 MG tablet Take 1 tablet daily  . promethazine (PHENERGAN) 25 MG tablet TAKE 1 TABLET(25 MG) BY MOUTH EVERY 6 HOURS AS NEEDED FOR NAUSEA OR VOMITING  . triamcinolone (NASACORT ALLERGY 24HR) 55 MCG/ACT AERO nasal inhaler Place 1 spray into the nose daily.  Marland Kitchen venlafaxine XR (EFFEXOR-XR) 75 MG 24 hr capsule TAKE 1 CAPSULE BY MOUTH EVERY DAY WITH  BREAKFAST  . zolpidem (AMBIEN) 5 MG tablet TAKE 1 TABLET BY MOUTH EVERY DAY AT BEDTIME AS NEEDED  . budesonide-formoterol (SYMBICORT) 160-4.5 MCG/ACT inhaler Inhale 2 puffs into the lungs 2 (two) times daily.  . methylPREDNISolone (MEDROL) 4 MG TBPK tablet Take as directed per package instructions  . [DISCONTINUED] benzonatate (TESSALON) 100 MG capsule Take 1 capsule (100 mg total) by mouth 3 (three) times daily as needed. (Patient not taking: Reported on 11/29/2016)  . [DISCONTINUED] DULERA 200-5 MCG/ACT AERO Inhale 2 puffs into the lungs 2 (two) times daily.  . [DISCONTINUED] metFORMIN (GLUCOPHAGE) 500 MG tablet Take once daily.  If well tolerated increase to twice a day after 2 weeks (Patient not taking: Reported on 11/29/2016)   No facility-administered encounter medications on file as of 11/29/2016.     Allergies  Allergen Reactions  . Ace Inhibitors Other (See Comments)    Angina  . Cefaclor   . Lisinopril Cough  . Losartan Potassium   . Sulfonamide Derivatives     Immunization History  Administered Date(s) Administered  . Influenza Inj Mdck Quad Pf 01/21/2016  . Influenza-Unspecified 12/16/2012, 01/15/2014  . Pneumococcal Polysaccharide-23 04/17/2004, 04/14/2013  . Td 04/17/1997  . Tdap 07/22/2008    Current Medications, Allergies, Past Medical History, Past Surgical History, Family History, and Social History were reviewed in [employer] record.   Review of Systems            All symptoms NEG except where BOLDED >>  Constitutional:  F/C/S, fatigue, anorexia,  unexpected weight change. HEENT:  HA, visual changes, hearing loss, earache, nasal symptoms, sore throat, mouth sores, hoarseness. Resp:  cough, sputum, hemoptysis; SOB, tightness, wheezing. Cardio:  CP, palpit, DOE, orthopnea, edema. GI:  N/V/D/C, blood in stool; reflux, abd pain, distention, gas. GU:  dysuria, freq, urgency, hematuria, flank pain, voiding difficulty. MS:  joint pain,  swelling, tenderness, decr ROM; neck pain, back pain, etc. Neuro:  HA, tremors, seizures, dizziness, syncope, weakness, numbness, gait abn. Skin:  suspicious lesions or skin rash. Heme:  adenopathy, bruising, bleeding. Psyche:  confusion, agitation, sleep disturbance, hallucinations, anxiety, depression suicidal.   Objective:   Physical Exam      Vital Signs:  Reviewed...   General:  WD, WN, 51 y/o WF in NAD; alert & oriented; pleasant & cooperative... HEENT:  [place]/AT; Conjunctiva- pink, Sclera- nonicteric, EOM-wnl, PERRLA, EACs-clear, TMs-wnl; NOSE-clear; THROAT-clear & wnl.  Neck:  Supple w/ full ROM; no JVD; normal carotid impulses w/o bruits; no thyromegaly or nodules palpated; no lymphadenopathy.  Chest:  Clear to P & A; without wheezes, rales, or rhonchi heard. Heart:  Regular Rhythm; norm S1 & S2 without murmurs, rubs, or gallops detected. Abdomen:  Soft & nontender- no guarding or rebound; normal bowel sounds; no organomegaly or masses palpated. Ext:  decrROM; without deformities, + arthritic changes; no varicose veins, venous insuffic, or edema;  Pulses intact w/o bruits. Neuro:  No focal neuro findings; gait normal & balance OK. Derm:  No lesions noted; no rash etc. Lymph:  No cervical, supraclavicular, axillary, or inguinal adenopathy palpated.   Assessment:      IMP >>     Allergic Rhinitis> IgE level was 129 but RAST tests all neg x ragweed; on Singulair, OTC Antihist prn & Nasacort...    Asthma & AB> CXR w/ sl elev of right diaph & CT w/ tiny 17m RUL nodules; on AdvairHFA-110 2spBid (due to insurance) & improved, reminded to rinse etc...    Ex-smoker> former heavy smoker but quit cold [place] ago... Spirometry wnl 5/16.    GERD> on Prevacid30Bid, [facility] we reviewed the antireflux regimen...    Medical issues include> HBP, HL, GERD, Crohn's, DJD, HAs, Depression... [place]   PLAN >>  09/15/14>   KGenihas a signif  remote smoking hx, but her current PFTs are wnl mitigating against COPD & in favor of her Asthma diagnosis;  She appears to be well controlled on her current regimen w/ Dulera, Singulair, Xopenex, Tussionex;  The IgE level is sl elev but the RAST panel is ok x low level Ragweed antibodies;  She appears to get freq upper resp (sinus/ bronchitis) & this exacerbates her asthma;  In addition she has a long hx of GERD on OTC Prevacid Bid but no following an antireflux regimen- we reviewed the needed elev HOB, NPO after dinner, etc... She is asked to start back on an exercise program eg- bike or swimming (non-weight bearing due to her knee)... 11/18/15>   Erin Fields Billingsremains stable on her regimen including: Dulera200-2spBid, Singulair10, XopenexHFA prn, plus allergy shots per DrSharma...  11/29/16>   We decided to treat w/ a medrol dosepak & continue her Asthma rx w/ Symbicort160-2spBid & XopenexHFA prn; she has been under a lot of stress, discussed & support provided- Rx per PCP; she will call for any additional problems or exacerbations     Plan:     Patient's Medications  New Prescriptions   BUDESONIDE-FORMOTEROL (SYMBICORT) 160-4.5 MCG/ACT INHALER  Inhale 2 puffs into the lungs 2 (two) times daily.   METHYLPREDNISOLONE (MEDROL) 4 MG TBPK TABLET    Take as directed per package instructions  Previous Medications   ACYCLOVIR (ZOVIRAX) 400 MG TABLET    TAKE 1 TABLET BY MOUTH THREE TIMES DAILY AS NEEDED   AZELASTINE (ASTELIN) 0.1 % NASAL SPRAY    2 sprays 2 (two) times daily.   BUDESONIDE-FORMOTEROL (SYMBICORT) 160-4.5 MCG/ACT INHALER    Inhale 2 puffs into the lungs 2 (two) times daily.   DIAZEPAM (VALIUM) 2 MG TABLET    Take 1 tablet (2 mg total) by mouth every 6 (six) hours as needed for anxiety.   ESTRADIOL-NORETHINDRONE ACET (LOPREEZA) 0.5-0.1 MG TABLET    Take 1 tablet by mouth daily.   LEVALBUTEROL (XOPENEX HFA) 45 MCG/ACT INHALER    Inhale 2 puffs into the lungs every 4 (four) hours as needed for wheezing  (Q4-6 Hours PRN).   LEVOTHYROXINE (SYNTHROID, LEVOTHROID) 88 MCG TABLET    Take 1 tablet (88 mcg total) by mouth daily.   LOVASTATIN (MEVACOR) 40 MG TABLET    TAKE 1 TABLET(40 MG) BY MOUTH AT BEDTIME   MESALAMINE (LIALDA) 1.2 G EC TABLET    Take 2.4 g by mouth daily.   MONTELUKAST (SINGULAIR) 10 MG TABLET    Take 10 mg by mouth at bedtime.    OLMESARTAN (BENICAR) 40 MG TABLET    Take 1 tablet daily   PROMETHAZINE (PHENERGAN) 25 MG TABLET    TAKE 1 TABLET(25 MG) BY MOUTH EVERY 6 HOURS AS NEEDED FOR NAUSEA OR VOMITING   TRIAMCINOLONE (NASACORT ALLERGY 24HR) 55 MCG/ACT AERO NASAL INHALER    Place 1 spray into the nose daily.   VENLAFAXINE XR (EFFEXOR-XR) 75 MG 24 HR CAPSULE    TAKE 1 CAPSULE BY MOUTH EVERY DAY WITH BREAKFAST   ZOLPIDEM (AMBIEN) 5 MG TABLET    TAKE 1 TABLET BY MOUTH EVERY DAY AT BEDTIME AS NEEDED  Modified Medications   No medications on file  Discontinued Medications   BENZONATATE (TESSALON) 100 MG CAPSULE    Take 1 capsule (100 mg total) by mouth 3 (three) times daily as needed.   DULERA 200-5 MCG/ACT AERO    Inhale 2 puffs into the lungs 2 (two) times daily.   METFORMIN (GLUCOPHAGE) 500 MG TABLET    Take once daily.  If well tolerated increase to twice a day after 2 weeks

## 2016-12-02 ENCOUNTER — Other Ambulatory Visit: Payer: Self-pay | Admitting: Physician Assistant

## 2016-12-02 NOTE — Telephone Encounter (Signed)
Will defer further refills of patient's medications to PCP

## 2016-12-18 ENCOUNTER — Other Ambulatory Visit: Payer: Self-pay | Admitting: Family Medicine
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
## 2016-12-20 ENCOUNTER — Ambulatory Visit (INDEPENDENT_AMBULATORY_CARE_PROVIDER_SITE_OTHER): Payer: BLUE CROSS/BLUE SHIELD | Admitting: Family Medicine

## 2016-12-20 ENCOUNTER — Encounter: Payer: Self-pay | Admitting: Family Medicine

## 2016-12-20 VITALS — BP 130/84 | HR 91 | Temp 98.8°F | Ht 63.0 in | Wt 178.4 lb

## 2016-12-20 DIAGNOSIS — R7303 Prediabetes: Secondary | ICD-10-CM | POA: Diagnosis not present

## 2016-12-20 DIAGNOSIS — E034 Atrophy of thyroid (acquired): Secondary | ICD-10-CM

## 2016-12-20 DIAGNOSIS — K219 Gastro-esophageal reflux disease without esophagitis: Secondary | ICD-10-CM

## 2016-12-20 DIAGNOSIS — E782 Mixed hyperlipidemia: Secondary | ICD-10-CM

## 2016-12-20 DIAGNOSIS — B001 Herpesviral vesicular dermatitis: Secondary | ICD-10-CM

## 2016-12-20 DIAGNOSIS — I10 Essential (primary) hypertension: Secondary | ICD-10-CM | POA: Diagnosis not present

## 2016-12-20 DIAGNOSIS — R079 Chest pain, unspecified: Secondary | ICD-10-CM

## 2016-12-20 DIAGNOSIS — F430 Acute stress reaction: Secondary | ICD-10-CM | POA: Diagnosis not present

## 2016-12-20 DIAGNOSIS — F5104 Psychophysiologic insomnia: Secondary | ICD-10-CM

## 2016-12-20 DIAGNOSIS — Z23 Encounter for immunization: Secondary | ICD-10-CM | POA: Diagnosis not present

## 2016-12-20 LAB — TSH: TSH: 1.23 u[IU]/mL (ref 0.35–4.50)

## 2016-12-20 LAB — LIPID PANEL
CHOL/HDL RATIO: 5
CHOLESTEROL: 214 mg/dL — AB (ref 0–200)
HDL: 40.6 mg/dL (ref >39.00)
NONHDL: 173.11
Triglycerides: 306 mg/dL — ABNORMAL HIGH (ref 0.0–149.0)
VLDL: 61.2 mg/dL — AB (ref 0.0–40.0)

## 2016-12-20 LAB — BASIC METABOLIC PANEL
BUN: 8 mg/dL (ref 6–23)
CHLORIDE: 106 meq/L (ref 96–112)
CO2: 26 meq/L (ref 19–32)
CREATININE: 0.73 mg/dL (ref 0.40–1.20)
Calcium: 9.4 mg/dL (ref 8.4–10.5)
GFR: 88.42 mL/min (ref >60.00)
GLUCOSE: 114 mg/dL — AB (ref 70–99)
Potassium: 4.2 mEq/L (ref 3.5–5.1)
Sodium: 142 mEq/L (ref 135–145)

## 2016-12-20 LAB — LDL CHOLESTEROL, DIRECT: Direct LDL: 111 mg/dL

## 2016-12-20 LAB — HEMOGLOBIN A1C: Hgb A1c MFr Bld: 6.4 % (ref 4.6–6.5)

## 2016-12-20 MED ORDER — ACYCLOVIR 400 MG PO TABS: 400.0000 mg | ORAL_TABLET | Freq: Three times a day (TID) | ORAL | 5 refills | Status: DC | PRN

## 2016-12-20 MED ORDER — ZOLPIDEM TARTRATE 5 MG PO TABS: ORAL_TABLET | ORAL | 3 refills | Status: DC

## 2016-12-20 MED ORDER — VENLAFAXINE HCL ER 75 MG PO CP24: ORAL_CAPSULE | ORAL | 3 refills | Status: DC

## 2016-12-20 MED ORDER — DIAZEPAM 2 MG PO TABS: 2.0000 mg | ORAL_TABLET | Freq: Four times a day (QID) | ORAL | 0 refills | Status: DC | PRN

## 2016-12-20 MED ORDER — LEVOTHYROXINE SODIUM 88 MCG PO TABS: 88.0000 ug | ORAL_TABLET | Freq: Every day | ORAL | 3 refills | Status: DC

## 2016-12-20 MED ORDER — PROMETHAZINE HCL 25 MG PO TABS: ORAL_TABLET | ORAL | 0 refills | Status: DC

## 2016-12-20 NOTE — Patient Instructions (Signed)
It was good to see you today! I am sorry that things have been so tough for you over the last 18 months.  If you are not doing ok please let me know I will be in touch with your labs asap We are going to arrange for an exercise treadmill test for you- let me know if you do not hear about this in the next week or so.  In the meantime, please take a baby aspirin once a day.  If you have any change or worsening in your chest discomfort please alert me  Continue to use ambien at bedtime as needed.  Remember do NOT combine this med with alcohol, phenergan or valium The valium and phenergan can make you feel sleepy- do not use them if you need to drive

## 2016-12-22 ENCOUNTER — Encounter: Payer: Self-pay | Admitting: Family Medicine

## 2016-12-22 MED ORDER — ROSUVASTATIN CALCIUM 10 MG PO TABS: 10.0000 mg | ORAL_TABLET | Freq: Every day | ORAL | 11 refills | Status: DC

## 2017-02-15 HISTORY — PX: BREAST BIOPSY: SHX20

## 2017-02-28 ENCOUNTER — Other Ambulatory Visit: Payer: Self-pay | Admitting: Family Medicine

## 2017-02-28 DIAGNOSIS — F430 Acute stress reaction: Secondary | ICD-10-CM

## 2017-02-28 MED ORDER — DIAZEPAM 2 MG PO TABS: 2.0000 mg | ORAL_TABLET | Freq: Four times a day (QID) | ORAL | 1 refills | Status: DC | PRN

## 2017-02-28 NOTE — Telephone Encounter (Signed)
Pt is requesting refill on diazepam 17m.

## 2017-03-07 ENCOUNTER — Other Ambulatory Visit: Payer: Self-pay | Admitting: Obstetrics & Gynecology

## 2017-03-07 DIAGNOSIS — N6310 Unspecified lump in the right breast, unspecified quadrant: Secondary | ICD-10-CM

## 2017-03-15 ENCOUNTER — Other Ambulatory Visit: Payer: Self-pay | Admitting: Obstetrics & Gynecology

## 2017-03-15 ENCOUNTER — Ambulatory Visit
Admission: RE | Admit: 2017-03-15 | Discharge: 2017-03-15 | Disposition: A | Discharge status: Home / Self Care | Payer: BLUE CROSS/BLUE SHIELD | Source: Ambulatory Visit | Attending: Obstetrics & Gynecology | Admitting: Obstetrics & Gynecology

## 2017-03-15 DIAGNOSIS — N6310 Unspecified lump in the right breast, unspecified quadrant: Secondary | ICD-10-CM

## 2017-03-16 ENCOUNTER — Ambulatory Visit
Admission: RE | Admit: 2017-03-16 | Discharge: 2017-03-16 | Disposition: A | Discharge status: Home / Self Care | Payer: BLUE CROSS/BLUE SHIELD | Source: Ambulatory Visit | Attending: Obstetrics & Gynecology | Admitting: Obstetrics & Gynecology

## 2017-03-16 DIAGNOSIS — N6310 Unspecified lump in the right breast, unspecified quadrant: Secondary | ICD-10-CM

## 2017-03-19 ENCOUNTER — Telehealth: Payer: Self-pay | Admitting: Hematology

## 2017-03-19 NOTE — Telephone Encounter (Signed)
Confirmed morning BC appointment for 03/28/17 with patient, will email packet per patients request

## 2017-03-20 ENCOUNTER — Encounter: Payer: Self-pay | Admitting: *Deleted

## 2017-03-20 DIAGNOSIS — Z170 Estrogen receptor positive status [ER+]: Secondary | ICD-10-CM

## 2017-03-20 DIAGNOSIS — C50411 Malignant neoplasm of upper-outer quadrant of right female breast: Secondary | ICD-10-CM | POA: Insufficient documentation

## 2017-03-20 HISTORY — DX: Estrogen receptor positive status (ER+): C50.411

## 2017-03-20 HISTORY — DX: Estrogen receptor positive status (ER+): Z17.0

## 2017-03-21 ENCOUNTER — Other Ambulatory Visit: Payer: Self-pay | Admitting: Family Medicine

## 2017-03-27 NOTE — Progress Notes (Signed)
[name]  [phone]) [phone] Fax:([phone]) [place] Note   Patient Care Team: Copland, Gay Filler, MD as PCP - General (Family Medicine) [name], MD as Referring Physician (Obstetrics and Gynecology) [name], MD as Consulting Physician (Gastroenterology) [name], MD as Referring Physician (Allergy) [name], MD as Consulting Physician (General Surgery) [name], MD as Consulting Physician (Radiation Oncology) [name], MD as Consulting Physician (Hematology)   Date of Service:  03/28/2017  CHIEF COMPLAINTS/PURPOSE OF CONSULTATION:  Malignant neoplasm of upper-outer quadrant of right breast   Oncology History   Cancer Staging Malignant neoplasm of upper-outer quadrant of right breast in female, estrogen receptor positive (Erin Fields) Staging form: Breast, AJCC 8th Edition - Clinical stage from 03/16/2017: Stage IA (cT1b, cN0, cM0, G2, ER: Positive, PR: Positive, HER2: Negative) - Signed by [name], MD on 03/28/2017 - Pathologic: No stage assigned - Unsigned       Malignant neoplasm of upper-outer quadrant of right breast in female, estrogen receptor positive (Erin Fields)   03/15/2017 Mammogram    Diagnostic Mammogram Right Breast  IMPRESSION: Palpable abnormality corresponds to distortion and focal mass associated acoustic shadowing measuring 0.8x0.9x0.9cm in the 930 o'clock location of the right breast 7 cm from the nipple.   RECOMMENDATION: Ultrasound-guided core biopsy is recommended and scheduled for the patient.      03/16/2017 Initial Biopsy    Biopsy Results  Diagnosis 03/16/17 Breast, right, needle core biopsy, 9:30 o'clock, ribbon clip placed - INVASIVE MAMMARY CARCINOMA. - SEE COMMENT.       03/16/2017 Receptors her2    Estrogen Receptor: 95%, POSITIVE, STRONG STAINING INTENSITY Progesterone Receptor: 95%, POSITIVE, STRONG STAINING INTENSITY Proliferation Marker Ki67: 3% HER2: NEGATIVE      03/20/2017 Initial Diagnosis    Malignant neoplasm of upper-outer quadrant of right breast in female, estrogen receptor positive (Erin Fields)        HISTORY OF PRESENTING ILLNESS: 03/28/17 Erin Fields 51 y.o. female is a here because of newly diagnosed right breast cancer.The patient presents to the clinic today accompanied by her sister.  Her mass palpated by patient initially in late October. She felt slight pain. She initially thought it was from a cyst which she had several times before. The pain went away but the lump remained. She went to her GYN who did further work up and found her mass to be cancer. She feels tenderness and has bruising from biopsy.   Today she reports she lost her husband in a car accident in 2018 and lost her daughter to suicide from overdose in 2017. She takes Valium daily to help with her anxiety. This is managed by Dr. Lorelei Pont. She declined seeing a Education officer, museum from our clinic. She feels better after speaking with the doctors today. She notes to having occasional ankle swelling.   In the past the patient was diagnosed with multiple comorbidities. She plans to have right knee surgery for her arthritis in the future. She takes aleve for pain. She had multiple surgeries on her hands and feet, ablations, a vein removed and nasal pathway surgery. Her mother had diffuse metastatic cancer at 8yo, and she does not know the primary tumor. Patient quit smoking in 1995. She had ablation in 2011 due to menorrhagia that she was having every 2 weeks. She had significant hot flashes and started hormonal replacement in 07/2016 that she stopped 2 weeks ago. She did not increase her Effexor as she says this would increase her blood pressure.  GYN HISTORY  Menarchal: 9 LMP: no period since endometrial ablation in 2011 Contraceptive: yes, age [age] HRT: Lopreeza, 8 months from 07/2016-02/2017 G3P2A1: 1 miscarriage, has 1 son and 1 deceased daughter.     MEDICAL HISTORY:  Past  Medical History:  Diagnosis Date  . Allergy   . Asthma   . Chicken pox   . Colon polyps   . Depression   . Elevated blood pressure   . Environmental allergies   . Frequent headaches   . GERD (gastroesophageal reflux disease)   . Hay fever   . Hyperlipidemia   . Hypertension   . Migraines   . Thyroid disease   . Ulcerative colitis ([place])   . Varicose veins     SURGICAL HISTORY: Past Surgical History:  Procedure Laterality Date  . ABLATION  2011  . FOOT SURGERY  2012  . HAND SURGERY    . TONSILLECTOMY  1980  . VEIN SURGERY     Removal Right Leg  . WISDOM TOOTH EXTRACTION      SOCIAL HISTORY: Social History   Socioeconomic History  . Marital status: Married    Spouse name: Not on file  . Number of children: Not on file  . Years of education: Not on file  . Highest education level: Not on file  Social Needs  . Financial resource strain: Not on file  . Food insecurity - worry: Not on file  . Food insecurity - inability: Not on file  . Transportation needs - medical: Not on file  . Transportation needs - non-medical: Not on file  Occupational History  . Not on file  Tobacco Use  . Smoking status: Former Smoker    Packs/day: 4.00    Years: 15.00    Pack years: 60.00    Types: Cigarettes    Last attempt to quit: 09/14/1993    Years since quitting: 23.5  . Smokeless tobacco: Never Used  . Tobacco comment: Quit >20 yrs ago  Substance and Sexual Activity  . Alcohol use: Yes    Alcohol/week: 1.8 oz    Types: 3 Glasses of wine per week    Comment: once every 2-3 months   . Drug use: No  . Sexual activity: Not on file  Other Topics Concern  . Not on file  Social History Narrative  . Not on file    FAMILY HISTORY: Family History  Problem Relation Age of Onset  . Arthritis Mother 69       Deceased  . Breast cancer Mother   . Lung cancer Mother   . Hyperlipidemia Mother   . Heart disease Mother   . Hypertension Mother   . Diabetes Mother   . Crohn's  disease Mother   . Diabetes Maternal Grandfather   . Heart disease Maternal Grandfather   . Heart attack Maternal Grandfather   . Colitis Sister     ALLERGIES:  is allergic to ace inhibitors; aspirin; cefaclor; lisinopril; losartan potassium; and sulfonamide derivatives.  MEDICATIONS:  Current Outpatient Medications  Medication Sig Dispense Refill  . acyclovir (ZOVIRAX) 400 MG tablet Take 1 tablet (400 mg total) by mouth 3 (three) times daily as needed. 30 tablet 5  . azelastine (ASTELIN) 0.1 % nasal spray 2 sprays 2 (two) times daily.  6  . budesonide-formoterol (SYMBICORT) 160-4.5 MCG/ACT inhaler Inhale 2 puffs into the lungs 2 (two) times daily.    . diazepam (VALIUM) 2 MG tablet Take 1 tablet (2 mg total) every 6 (six) hours as  needed by mouth for anxiety. Do not combine with ambien 30 tablet 1  . Diclofenac Sodium (PENNSAID) 2 % SOLN [place]aid 20 mg/gram/actuation (2 %) topical soln in metered-dose pump  APPLY TWO PUMPS TOPICALLY TO THE AFFECTED AREA TWICE DAILY    . levalbuterol (XOPENEX HFA) 45 MCG/ACT inhaler Inhale 2 puffs into the lungs every 4 (four) hours as needed for wheezing (Q4-6 Hours PRN).    Marland Kitchen levothyroxine (SYNTHROID, LEVOTHROID) 88 MCG tablet Take 1 tablet (88 mcg total) by mouth daily. 90 tablet 3  . lovastatin (MEVACOR) 40 MG tablet TAKE 1 TABLET(40 MG) BY MOUTH AT BEDTIME 90 tablet 3  . meloxicam (MOBIC) 15 MG tablet meloxicam 15 mg tablet    . mesalamine (LIALDA) 1.2 G EC tablet Take 2.4 g by mouth daily.    . montelukast (SINGULAIR) 10 MG tablet Take 10 mg by mouth at bedtime.     Marland Kitchen olmesartan (BENICAR) 40 MG tablet Take 1 tablet daily 90 tablet 3  . promethazine (PHENERGAN) 25 MG tablet Take 1/2 or 1 daily as needed for nausea 30 tablet 0  . promethazine (PHENERGAN) 25 MG tablet TAKE 1/2-1 TABLET DAILY AS NEEDED FOR NAUSEA 30 tablet 0  . rosuvastatin (CRESTOR) 10 MG tablet Take 1 tablet (10 mg total) by mouth daily. 30 tablet 11  . triamcinolone (NASACORT ALLERGY  24HR) 55 MCG/ACT AERO nasal inhaler Place 1 spray into the nose daily.    Marland Kitchen venlafaxine XR (EFFEXOR-XR) 75 MG 24 hr capsule TAKE 1 CAPSULE BY MOUTH EVERY DAY WITH BREAKFAST 90 capsule 3  . zolpidem (AMBIEN) 5 MG tablet TAKE 1 TABLET BY MOUTH EVERY DAY AT BEDTIME AS NEEDED 30 tablet 3   No current facility-administered medications for this visit.     REVIEW OF SYSTEMS:   Constitutional: Denies fevers, chills or abnormal night sweats Eyes: Denies blurriness of vision, double vision or watery eyes Ears, nose, mouth, throat, and face: Denies mucositis or sore throat Respiratory: Denies cough, dyspnea or wheezes Cardiovascular: Denies palpitation, chest discomfort (+) occasional lower extremity swelling Gastrointestinal:  Denies nausea, heartburn or change in bowel habits Skin: Denies abnormal skin rashes Lymphatics: Denies new lymphadenopathy or easy bruising Neurological:Denies numbness, tingling or new weaknesses MSK: (+) arthritis, especially in right knee Behavioral/Psych: Mood is stable, no new changes  All other systems were reviewed with the patient and are negative. Breast: (+) Palpable mass in right breast, tenderness and mild ecchymosis at biopsy site.  PHYSICAL EXAMINATION: ECOG PERFORMANCE STATUS: 0 - Asymptomatic  Vitals:   03/28/17 0838  BP: (!) 153/91  Pulse: (!) 112  Resp: 20  Temp: 98.3 F (36.8 C)  SpO2: 98%   Filed Weights   03/28/17 0838  Weight: 182 lb 4.8 oz (82.7 kg)    GENERAL:alert, no distress and comfortable SKIN: skin color, texture, turgor are normal, no rashes or significant lesions EYES: normal, conjunctiva are pink and non-injected, sclera clear OROPHARYNX:no exudate, no erythema and lips, buccal mucosa, and tongue normal  NECK: supple, thyroid normal size, non-tender, without nodularity LYMPH:  no palpable lymphadenopathy in the cervical, axillary or inguinal LUNGS: clear to auscultation and percussion with normal breathing effort HEART:  regular rate & rhythm and no murmurs and no lower extremity edema ABDOMEN:abdomen soft, non-tender and normal bowel sounds Musculoskeletal:no cyanosis of digits and no clubbing  PSYCH: alert & oriented x 3 with fluent speech NEURO: no focal motor/sensory deficits Breast: (+) palpable 0.8cm mass in 10:00 position of right breast with mild ecchymosis and tenderness  at biopsy site. No other palpable mass or adenopathy.   LABORATORY DATA:  I have reviewed the data as listed CBC Latest Ref Rng & Units 03/28/2017 08/30/2015 09/15/2014  WBC 3.9 - 10.3 10e3/uL 9.6 8.9 8.5  Hemoglobin 11.6 - 15.9 g/dL 13.6 12.9 13.3  Hematocrit 34.8 - 46.6 % 41.9 39.2 39.0  Platelets 145 - 400 10e3/uL 341 360.0 430.0(H)    CMP Latest Ref Rng & Units 03/28/2017 12/20/2016 08/21/2016  Glucose 70 - 140 mg/dl 160(H) 114(H) 99  BUN 7.0 - 26.0 mg/dL 7.0 8 12  Creatinine 0.6 - 1.1 mg/dL 0.8 0.73 0.64  Sodium 136 - 145 mEq/L 141 142 137  Potassium 3.5 - 5.1 mEq/L 3.7 4.2 3.9  Chloride 96 - 112 mEq/L - 106 101  CO2 22 - 29 mEq/L 23 26 28   Calcium 8.4 - 10.4 mg/dL 9.5 9.4 9.2  Total Protein 6.4 - 8.3 g/dL 6.9 - -  Total Bilirubin 0.20 - 1.20 mg/dL 0.65 - -  Alkaline Phos 40 - 150 U/L 64 - -  AST 5 - 34 U/L 25 - -  ALT 0 - 55 U/L 24 - -    PATHOLOGY  Biopsy Results  Diagnosis 03/16/17 Breast, right, needle core biopsy, 9:30 o'clock, ribbon clip placed - INVASIVE MAMMARY CARCINOMA. - SEE COMMENT. Microscopic Comment The carcinoma appears grade II. An E-cadherin stain and a breast prognostic profile will be performed and the results will be reported separately. The results were called to [facility] on 03/20/19/18. FLUORESCENCE IN-SITU HYBRIDIZATION Results: HER2 - NEGATIVE RATIO OF HER2/CEP17 SIGNALS 1.22 AVERAGE HER2 COPY NUMBER PER CELL 1.65 PROGNOSTIC INDICATORS Results: IMMUNOHISTOCHEMICAL AND MORPHOMETRIC ANALYSIS PERFORMED MANUALLY Estrogen Receptor: 95%, POSITIVE, STRONG STAINING  INTENSITY Progesterone Receptor: 95%, POSITIVE, STRONG STAINING INTENSITY Proliferation Marker Ki67: 3%  The malignant cells are negative for E-cadherin, supporting a lobular phenotype.  RADIOGRAPHIC STUDIES: I have personally reviewed the radiological images as listed and agreed with the findings in the report. US Breast Ltd Uni Right Inc Axilla  Result Date: 03/15/2017 CLINICAL DATA:  Palpable abnormality in the right breast. EXAM: 2D DIGITAL DIAGNOSTIC RIGHT MAMMOGRAM WITH CAD AND ADJUNCT TOMO ULTRASOUND RIGHT BREAST COMPARISON:  Previous exam(s). ACR Breast Density Category b: There are scattered areas of fibroglandular density. FINDINGS: Within the upper-outer quadrant of the right breast, there is asymmetric density associated with distortion and punctate and layering calcifications. This area of asymmetry corresponds to the area of patient's concern. Mammographic images were processed with CAD. On physical exam, there is a focal area of thickening in the 930 o'clock location of the right breast, best palpated when the patient is sitting. Targeted ultrasound is performed, showing an irregular hypoechoic mass in the 930 o'clock location of the right breast 7 cm from the nipple associated with posterior acoustic shadowing measuring 0.8 x 0.9 x 0.9 cm. There is associated internal vascularity. Evaluation of the axilla is negative for adenopathy. IMPRESSION: Palpable abnormality corresponds to distortion and focal mass associated acoustic shadowing in the 930 o'clock location of the right breast. RECOMMENDATION: Ultrasound-guided core biopsy is recommended and scheduled for the patient. I have discussed the findings and recommendations with the patient. Results were also provided in writing at the conclusion of the visit. If applicable, a reminder letter will be sent to the patient regarding the next appointment. BI-RADS CATEGORY  4: Suspicious. Electronically Signed   By: Nolon Nations M.D.   On:  03/15/2017 16:35   Mm Diag Breast Tomo Uni Right  Result Date: 03/15/2017 CLINICAL DATA:  Palpable abnormality in the right breast. EXAM: 2D DIGITAL DIAGNOSTIC RIGHT MAMMOGRAM WITH CAD AND ADJUNCT TOMO ULTRASOUND RIGHT BREAST COMPARISON:  Previous exam(s). ACR Breast Density Category b: There are scattered areas of fibroglandular density. FINDINGS: Within the upper-outer quadrant of the right breast, there is asymmetric density associated with distortion and punctate and layering calcifications. This area of asymmetry corresponds to the area of patient's concern. Mammographic images were processed with CAD. On physical exam, there is a focal area of thickening in the 930 o'clock location of the right breast, best palpated when the patient is sitting. Targeted ultrasound is performed, showing an irregular hypoechoic mass in the 930 o'clock location of the right breast 7 cm from the nipple associated with posterior acoustic shadowing measuring 0.8 x 0.9 x 0.9 cm. There is associated internal vascularity. Evaluation of the axilla is negative for adenopathy. IMPRESSION: Palpable abnormality corresponds to distortion and focal mass associated acoustic shadowing in the 930 o'clock location of the right breast. RECOMMENDATION: Ultrasound-guided core biopsy is recommended and scheduled for the patient. I have discussed the findings and recommendations with the patient. Results were also provided in writing at the conclusion of the visit. If applicable, a reminder letter will be sent to the patient regarding the next appointment. BI-RADS CATEGORY  4: Suspicious. Electronically Signed   By: Nolon Nations M.D.   On: 03/15/2017 16:35   Mm Clip Placement Right  Result Date: 03/16/2017 CLINICAL DATA:  Status post ultrasound-guided core biopsy of mass in the 930 o'clock location of the right breast. EXAM: DIAGNOSTIC RIGHT MAMMOGRAM POST ULTRASOUND BIOPSY COMPARISON:  Previous exam(s). FINDINGS: Mammographic images  were obtained following ultrasound guided biopsy of mass in the 930 o'clock location of the right breast. Ribbon shaped clip is identified within the area of distortion calcifications in the upper-outer quadrant of the right breast. IMPRESSION: Tissue marker clip in the expected location following biopsy. Final Assessment: Post Procedure Mammograms for Marker Placement Electronically Signed   By: Nolon Nations M.D.   On: 03/16/2017 16:06   [place] Rt Breast Bx W Loc Dev 1st Lesion Img Bx Spec US Guide  Addendum Date: 03/20/2017   ADDENDUM REPORT: 03/19/2017 13:59 ADDENDUM: Pathology revealed GRADE II INVASIVE MAMMARY CARCINOMA of the Right breast, 9:30 o'clock, (ribbon clip placed). This was found to be concordant by Dr. Nolon Nations. Pathology results were discussed with the patient by telephone. The patient reported doing well after the biopsy with tenderness at the site. Post biopsy instructions and care were reviewed and questions were answered. The patient was encouraged to call [facility] for any additional concerns. The patient was referred to [facility] on March 28, 2017. Pathology results reported by Terie Purser, RN on 03/19/2017. Electronically Signed   By: Nolon Nations M.D.   On: 03/19/2017 13:59   Result Date: 03/20/2017 CLINICAL DATA:  The patient presents for ultrasound-guided core biopsy of mass in the 930 o'clock location of the right breast. EXAM: ULTRASOUND GUIDED RIGHT BREAST CORE NEEDLE BIOPSY COMPARISON:  Previous exam(s). FINDINGS: I met with the patient and we discussed the procedure of ultrasound-guided biopsy, including benefits and alternatives. We discussed the high likelihood of a successful procedure. We discussed the risks of the procedure, including infection, bleeding, tissue injury, clip migration, and inadequate sampling. Informed written consent was given. The  usual time-out protocol was performed immediately prior to the procedure. Lesion  quadrant: Upper-outer quadrant right breast Using sterile technique and 1% Lidocaine as local anesthetic, under direct ultrasound visualization, a 14 gauge spring-loaded device was used to perform biopsy of mass in the 930 o'clock location of the right breast using a spot lateral approach. At the conclusion of the procedure a ribbon shaped tissue marker clip was deployed into the biopsy cavity. Follow up 2 view mammogram was performed and dictated separately. IMPRESSION: Ultrasound guided biopsy of right breast mass. No apparent complications. Electronically Signed: By: Nolon Nations M.D. On: 03/16/2017 15:52    ASSESSMENT & PLAN:  Erin Fields is a 51 y.o. caucasian female with a history of HLD, GERD, Depression and anxiety, asthma, allergies, HTN, hypothyroidism, liver hemangioma, h/o cervical cancer in 2008, and Ulcerative colitis.   1. Malignant neoplasm of upper-outer quadrant of right breast, invasive lobular carcinoma, stage IA, c (T1b, N0, M0), ER/PR: Positive, HER2 negative, Grade II --We discussed her imaging findings and the biopsy results in great details which showed evidence of invasive lobular carcinoma. -Given the lobular histology, would recommend bilateral breast MRI for further evaluation and to rule out multifocal disease. -Giving the early stage disease, she likely need a lumpectomy and sentinel lymph node biopsy. She is agreeable with that. She was seen by Dr. [name] today and likely will proceed with surgery soon.  -I recommend a Oncotype Dx test on the surgical sample and we'll make a decision about adjuvant chemotherapy based on the Oncotype result. Written material of this test was given to her. She is young and fit, would be a good candidate for chemotherapy if her Oncotype recurrence score is high. -If her surgical sentinel lymph node positive, I recommend mammaprint for further risk  stratification and guide adjuvant chemotherapy. -The risk of recurrence depends on the stage and biology of the tumor. She is early stage, with ER/PR positive and HER2 Negative. I discussed this is the second most common type of breast cancer. Lobular is more likely to have multifocal cancer in the breast, and less sensitive to chemotherapy.  I anticipate that this is likely going to be low risk disease on Oncotype. -She was also seen by radiation oncologist Dr. [name] today. She plans to proceed with lumpectomy and will undergo adjuvant radiation afterward. -Giving the strong ER and PR expression, I recommend adjuvant endocrine therapy with tamoxifen or aromatase inhibitor, such as Letrozole, depends on her menopause status, for a total of 5-10 years to reduce the risk of cancer recurrence. Potential benefits and side effects were discussed with patient and she is interested. She has recently stopped hormonal replacement Lopreeza after 8 months of use. Her menopausal status is unknown due to her prior endometrial ablation. Will check her [place] and estrogen level before starting adjuvant antiestrogen therapy. -We also discussed the breast cancer surveillance after her surgery. She will continue annual screening mammogram, self exam, and a routine office visit with lab and exam with [place]. -I encouraged her to have healthy diet and exercise regularly -Labs reviewed, CBC and CMP are WNL except her glucose is 160 today.  -I will see her after her surgery and radiation or sooner depending on her Oncotype results.     2. Genetic Testing -She is not a strong candidate for genetics given her unknown family history of cancer. She is insistent on doing genetic testing for herself and her son.  -I will send genetic referral    3. Anxiety  -Given her recent loss of husband and daughter I offered social  work referral. She declined at this time.  -She is currently on Valium daily managed by Dr. Lorelei Pont    PLAN:    -Genetic referral  -Breast MRI -Proceed with Lumpectomy and SLN biopsy by Dr. [name] soon  -Oncotype if node-negative, or mammaprint if node positive, if tumor >=1 cm and G1, or more than 5 mm with grade 2 of 3 -I will see her after her surgery and radiation or sooner depending on her Oncotype results.    No orders of the defined types were placed in this encounter.   All questions were answered. The patient knows to call the clinic with any problems, questions or concerns. I spent 40 minutes counseling the patient face to face. The total time spent in the appointment was 50 minutes and more than 50% was on counseling.     [name], MD 03/28/2017 11:49 AM   This document serves as a record of services personally performed by [name], MD. It was created on her behalf by Joslyn Devon, a trained medical scribe. The creation of this record is based on the scribe's personal observations and the provider's statements to them.    I have reviewed the above documentation for accuracy and completeness, and I agree with the above.

## 2017-03-28 ENCOUNTER — Ambulatory Visit
Admission: RE | Admit: 2017-03-28 | Discharge: 2017-03-28 | Disposition: A | Discharge status: Home / Self Care | Payer: BLUE CROSS/BLUE SHIELD | Source: Ambulatory Visit | Attending: Radiation Oncology | Admitting: Radiation Oncology

## 2017-03-28 ENCOUNTER — Other Ambulatory Visit (HOSPITAL_BASED_OUTPATIENT_CLINIC_OR_DEPARTMENT_OTHER): Payer: BLUE CROSS/BLUE SHIELD

## 2017-03-28 ENCOUNTER — Other Ambulatory Visit: Payer: Self-pay | Admitting: General Surgery

## 2017-03-28 ENCOUNTER — Ambulatory Visit (HOSPITAL_BASED_OUTPATIENT_CLINIC_OR_DEPARTMENT_OTHER): Payer: BLUE CROSS/BLUE SHIELD | Admitting: Hematology

## 2017-03-28 ENCOUNTER — Other Ambulatory Visit: Payer: Self-pay | Admitting: *Deleted

## 2017-03-28 ENCOUNTER — Encounter: Payer: Self-pay | Admitting: Hematology

## 2017-03-28 VITALS — BP 153/91 | HR 112 | Temp 98.3°F | Resp 20 | Ht 63.0 in | Wt 182.3 lb

## 2017-03-28 DIAGNOSIS — Z170 Estrogen receptor positive status [ER+]: Secondary | ICD-10-CM

## 2017-03-28 DIAGNOSIS — C50411 Malignant neoplasm of upper-outer quadrant of right female breast: Secondary | ICD-10-CM

## 2017-03-28 DIAGNOSIS — F419 Anxiety disorder, unspecified: Secondary | ICD-10-CM

## 2017-03-28 LAB — COMPREHENSIVE METABOLIC PANEL
ALT: 24 U/L (ref 0–55)
ANION GAP: 13 meq/L — AB (ref 3–11)
AST: 25 U/L (ref 5–34)
Albumin: 4 g/dL (ref 3.5–5.0)
Alkaline Phosphatase: 64 U/L (ref 40–150)
BUN: 7 mg/dL (ref 7.0–26.0)
CHLORIDE: 105 meq/L (ref 98–109)
CO2: 23 meq/L (ref 22–29)
Calcium: 9.5 mg/dL (ref 8.4–10.4)
Creatinine: 0.8 mg/dL (ref 0.6–1.1)
Glucose: 160 mg/dl — ABNORMAL HIGH (ref 70–140)
Potassium: 3.7 mEq/L (ref 3.5–5.1)
Sodium: 141 mEq/L (ref 136–145)
Total Bilirubin: 0.65 mg/dL (ref 0.20–1.20)
Total Protein: 6.9 g/dL (ref 6.4–8.3)

## 2017-03-28 LAB — CBC WITH DIFFERENTIAL/PLATELET
BASO%: 0.8 % (ref 0.0–2.0)
Basophils Absolute: 0.1 10*3/uL (ref 0.0–0.1)
EOS%: 3 % (ref 0.0–7.0)
Eosinophils Absolute: 0.3 10*3/uL (ref 0.0–0.5)
HCT: 41.9 % (ref 34.8–46.6)
HGB: 13.6 g/dL (ref 11.6–15.9)
LYMPH#: 3.3 10*3/uL (ref 0.9–3.3)
LYMPH%: 34.9 % (ref 14.0–49.7)
MCH: 28.8 pg (ref 25.1–34.0)
MCHC: 32.5 g/dL (ref 31.5–36.0)
MCV: 88.6 fL (ref 79.5–101.0)
MONO#: 0.7 10*3/uL (ref 0.1–0.9)
MONO%: 7.7 % (ref 0.0–14.0)
NEUT%: 53.6 % (ref 38.4–76.8)
NEUTROS ABS: 5.1 10*3/uL (ref 1.5–6.5)
Platelets: 341 10*3/uL (ref 145–400)
RBC: 4.73 10*6/uL (ref 3.70–5.45)
RDW: 12.7 % (ref 11.2–14.5)
WBC: 9.6 10*3/uL (ref 3.9–10.3)

## 2017-03-28 NOTE — Progress Notes (Signed)
error 

## 2017-03-28 NOTE — Progress Notes (Signed)
Radiation Oncology         [phone] ________________________________  Multidisciplinary Breast Oncology Clinic [facility]) Initial Outpatient Consultation  Name: Erin Fields MRN: [id number]  Date: 03/28/2017  DOB: 11-08-1965  QA:SUORVIF, Gay Filler, MD  [name], MD   REFERRING PHYSICIAN: [name], MD  DIAGNOSIS: Malignant neoplasm of upper-outer quadrant of right breast in female, estrogen receptor positive ([place]) Staging form: Breast, AJCC 8th Edition - Clinical stage from 03/16/2017: Stage IA (cT1b, cN0, cM0, G2, ER: Positive, PR: Positive, HER2: Negative)     ICD-10-CM   1. Malignant neoplasm of upper-outer quadrant of right breast in female, estrogen receptor positive ([place]) C50.411    Z17.0     HISTORY OF PRESENT ILLNESS::Erin Fields is a 51 y.o. female who is here today regarding malignant neoplasm of upper quadrant of right breast. Patient presented with palpable lump. Patient had a mammogram on 03/15/17 revealing a palpable abnormality measuring 0.8 x 0.9 x 0.9 cm in the 9:30 o'clock location of the right breast 7 cm from the nipple. On 03/16/17 patient had a biopsy which revealed invasive mammary carcinoma on the right breast, 9:30 o'clock region. Estrogen Receptor was 95 % positive, Progesterone Receptor was 95 % positive, HER2 negative and Proliferation marker Ki67 was 3%. The carcinoma appears grade II.   Oncology History   Cancer Staging Malignant neoplasm of upper-outer quadrant of right breast in female, estrogen receptor positive ([place]) Staging form: Breast, AJCC 8th Edition - Clinical stage from 03/16/2017: Stage IA (cT1b, cN0, cM0, G2, ER: Positive, PR: Positive, HER2: Negative) - Signed by [name], MD on 03/28/2017 - Pathologic: No stage assigned - Unsigned       Malignant neoplasm of upper-outer quadrant of right breast in female, estrogen receptor positive ([place])   03/15/2017 Mammogram    Diagnostic Mammogram Right Breast  IMPRESSION: Palpable  abnormality corresponds to distortion and focal mass associated acoustic shadowing measuring 0.8x0.9x0.9cm in the 930 o'clock location of the right breast 7 cm from the nipple.   RECOMMENDATION: Ultrasound-guided core biopsy is recommended and scheduled for the patient.      03/16/2017 Initial Biopsy    Biopsy Results  Diagnosis 03/16/17 Breast, right, needle core biopsy, 9:30 o'clock, ribbon clip placed - INVASIVE MAMMARY CARCINOMA. - SEE COMMENT.       03/16/2017 Receptors her2    Estrogen Receptor: 95%, POSITIVE, STRONG STAINING INTENSITY Progesterone Receptor: 95%, POSITIVE, STRONG STAINING INTENSITY Proliferation Marker Ki67: 3% HER2: NEGATIVE      03/20/2017 Initial Diagnosis    Malignant neoplasm of upper-outer quadrant of right breast in female, estrogen receptor positive ([place])         The patient was referred today for presentation in the multidisciplinary conference.  Radiology studies and pathology slides were presented there for review and discussion of treatment options.  A consensus was discussed regarding potential next steps.  PREVIOUS RADIATION THERAPY: No  PAST MEDICAL HISTORY:  has a past medical history of Allergy, Asthma, Chicken pox, Colon polyps, Depression, Elevated blood pressure, Environmental allergies, Frequent headaches, GERD (gastroesophageal reflux disease), Hay fever, Hyperlipidemia, Hypertension, Migraines, Thyroid disease, Ulcerative colitis ([place]), and Varicose veins.    PAST SURGICAL HISTORY: Past Surgical History:  Procedure Laterality Date  . ABLATION  2011  . FOOT SURGERY  2012  . HAND SURGERY    . TONSILLECTOMY  1980  . VEIN SURGERY     Removal Right Leg  . WISDOM TOOTH EXTRACTION      FAMILY HISTORY: family history includes  Arthritis (age of onset: 59) in her mother; Breast cancer in her mother; Colitis in her sister; Crohn's disease in her mother; Diabetes in her maternal grandfather and mother; Heart attack in her maternal  grandfather; Heart disease in her maternal grandfather and mother; Hyperlipidemia in her mother; Hypertension in her mother; Lung cancer in her mother.  SOCIAL HISTORY:  reports that she quit smoking about 23 years ago. Her smoking use included cigarettes. She has a 60.00 pack-year smoking history. she has never used smokeless tobacco. She reports that she drinks about 1.8 oz of alcohol per week. She reports that she does not use drugs.  ALLERGIES: Ace inhibitors; Aspirin; Cefaclor; Lisinopril; Losartan potassium; and Sulfonamide derivatives  MEDICATIONS:  Current Outpatient Medications  Medication Sig Dispense Refill  . acyclovir (ZOVIRAX) 400 MG tablet Take 1 tablet (400 mg total) by mouth 3 (three) times daily as needed. 30 tablet 5  . azelastine (ASTELIN) 0.1 % nasal spray 2 sprays 2 (two) times daily.  6  . budesonide-formoterol (SYMBICORT) 160-4.5 MCG/ACT inhaler Inhale 2 puffs into the lungs 2 (two) times daily.    . diazepam (VALIUM) 2 MG tablet Take 1 tablet (2 mg total) every 6 (six) hours as needed by mouth for anxiety. Do not combine with ambien 30 tablet 1  . Diclofenac Sodium (PENNSAID) 2 % SOLN Pennsaid 20 mg/gram/actuation (2 %) topical soln in metered-dose pump  APPLY TWO PUMPS TOPICALLY TO THE AFFECTED AREA TWICE DAILY    . levalbuterol (XOPENEX HFA) 45 MCG/ACT inhaler Inhale 2 puffs into the lungs every 4 (four) hours as needed for wheezing (Q4-6 Hours PRN).    Marland Kitchen levothyroxine (SYNTHROID, LEVOTHROID) 88 MCG tablet Take 1 tablet (88 mcg total) by mouth daily. 90 tablet 3  . lovastatin (MEVACOR) 40 MG tablet TAKE 1 TABLET(40 MG) BY MOUTH AT BEDTIME 90 tablet 3  . meloxicam (MOBIC) 15 MG tablet meloxicam 15 mg tablet    . mesalamine (LIALDA) 1.2 G EC tablet Take 2.4 g by mouth daily.    . montelukast (SINGULAIR) 10 MG tablet Take 10 mg by mouth at bedtime.     Marland Kitchen olmesartan (BENICAR) 40 MG tablet Take 1 tablet daily 90 tablet 3  . promethazine (PHENERGAN) 25 MG tablet Take 1/2 or  1 daily as needed for nausea 30 tablet 0  . promethazine (PHENERGAN) 25 MG tablet TAKE 1/2-1 TABLET DAILY AS NEEDED FOR NAUSEA 30 tablet 0  . rosuvastatin (CRESTOR) 10 MG tablet Take 1 tablet (10 mg total) by mouth daily. 30 tablet 11  . triamcinolone (NASACORT ALLERGY 24HR) 55 MCG/ACT AERO nasal inhaler Place 1 spray into the nose daily.    Marland Kitchen venlafaxine XR (EFFEXOR-XR) 75 MG 24 hr capsule TAKE 1 CAPSULE BY MOUTH EVERY DAY WITH BREAKFAST 90 capsule 3  . zolpidem (AMBIEN) 5 MG tablet TAKE 1 TABLET BY MOUTH EVERY DAY AT BEDTIME AS NEEDED 30 tablet 3   No current facility-administered medications for this encounter.     REVIEW OF SYSTEMS:  REVIEW OF SYSTEMS: A 10+ POINT REVIEW OF SYSTEMS WAS OBTAINED including neurology, dermatology, psychiatry, cardiac, respiratory, lymph, extremities, GI, GU, musculoskeletal, constitutional, reproductive, HEENT. All pertinent positives are noted in the HPI. All others are negative.   PHYSICAL EXAM:  Lungs are clear to auscultation bilaterally. Heart has regular rate and rhythm. No palpable cervical, supraclavicular, or axillary adenopathy. Abdomen soft, non-tender, normal bowel sounds.   Oncology Vitals 03/28/2017  Height 160 cm  Weight 82.691 kg  Weight (lbs) 182 lbs 5  oz  BMI (kg/m2) 32.29 kg/m2  Temp 98.3  Pulse 112  Resp 20  SpO2 98  BSA (m2) 1.92 m2    Breast Exam 9 to 10 o'clock position of right breast, patient has approximately 1 cm palpable lump, no nipple discharge of bleeding.  Left breast no palpable mass, nipple discharge or bleeding.   KPS = 90  100 - Normal; no complaints; no evidence of disease. 90   - Able to carry on normal activity; minor signs or symptoms of disease. 80   - Normal activity with effort; some signs or symptoms of disease. 82   - Cares for self; unable to carry on normal activity or to do active work. 60   - Requires occasional assistance, but is able to care for most of his personal needs. 50   - Requires  considerable assistance and frequent medical care. 73   - Disabled; requires special care and assistance. 49   - Severely disabled; hospital admission is indicated although death not imminent. 50   - Very sick; hospital admission necessary; active supportive treatment necessary. 10   - Moribund; fatal processes progressing rapidly. 0     - Dead  Karnofsky DA, [place]mann [place], Craver LS and Burchenal [facility] [phone]) The use of the nitrogen mustards in the palliative treatment of carcinoma: with particular reference to bronchogenic carcinoma Cancer 1 634-56  LABORATORY DATA:  Lab Results  Component Value Date   WBC 9.6 03/28/2017   HGB 13.6 03/28/2017   HCT 41.9 03/28/2017   MCV 88.6 03/28/2017   PLT 341 03/28/2017   Lab Results  Component Value Date   NA 141 03/28/2017   K 3.7 03/28/2017   CL 106 12/20/2016   CO2 23 03/28/2017   Lab Results  Component Value Date   ALT 24 03/28/2017   AST 25 03/28/2017   ALKPHOS 64 03/28/2017   BILITOT 0.65 03/28/2017    RADIOGRAPHY: US Breast Ltd Uni Right Inc Axilla  Result Date: 03/15/2017 CLINICAL DATA:  Palpable abnormality in the right breast. EXAM: 2D DIGITAL DIAGNOSTIC RIGHT MAMMOGRAM WITH CAD AND ADJUNCT TOMO ULTRASOUND RIGHT BREAST COMPARISON:  Previous exam(s). ACR Breast Density Category b: There are scattered areas of fibroglandular density. FINDINGS: Within the upper-outer quadrant of the right breast, there is asymmetric density associated with distortion and punctate and layering calcifications. This area of asymmetry corresponds to the area of patient's concern. Mammographic images were processed with CAD. On physical exam, there is a focal area of thickening in the 930 o'clock location of the right breast, best palpated when the patient is sitting. Targeted ultrasound is performed, showing an irregular hypoechoic mass in the 930 o'clock location of the right breast 7 cm from the nipple associated with posterior acoustic shadowing measuring  0.8 x 0.9 x 0.9 cm. There is associated internal vascularity. Evaluation of the axilla is negative for adenopathy. IMPRESSION: Palpable abnormality corresponds to distortion and focal mass associated acoustic shadowing in the 930 o'clock location of the right breast. RECOMMENDATION: Ultrasound-guided core biopsy is recommended and scheduled for the patient. I have discussed the findings and recommendations with the patient. Results were also provided in writing at the conclusion of the visit. If applicable, a reminder letter will be sent to the patient regarding the next appointment. BI-RADS CATEGORY  4: Suspicious. Electronically Signed   By: Nolon Nations M.D.   On: 03/15/2017 16:35   Mm Diag Breast Tomo Uni Right  Result Date: 03/15/2017 CLINICAL DATA:  Palpable abnormality in  the right breast. EXAM: 2D DIGITAL DIAGNOSTIC RIGHT MAMMOGRAM WITH CAD AND ADJUNCT TOMO ULTRASOUND RIGHT BREAST COMPARISON:  Previous exam(s). ACR Breast Density Category b: There are scattered areas of fibroglandular density. FINDINGS: Within the upper-outer quadrant of the right breast, there is asymmetric density associated with distortion and punctate and layering calcifications. This area of asymmetry corresponds to the area of patient's concern. Mammographic images were processed with CAD. On physical exam, there is a focal area of thickening in the 930 o'clock location of the right breast, best palpated when the patient is sitting. Targeted ultrasound is performed, showing an irregular hypoechoic mass in the 930 o'clock location of the right breast 7 cm from the nipple associated with posterior acoustic shadowing measuring 0.8 x 0.9 x 0.9 cm. There is associated internal vascularity. Evaluation of the axilla is negative for adenopathy. IMPRESSION: Palpable abnormality corresponds to distortion and focal mass associated acoustic shadowing in the 930 o'clock location of the right breast. RECOMMENDATION: Ultrasound-guided core  biopsy is recommended and scheduled for the patient. I have discussed the findings and recommendations with the patient. Results were also provided in writing at the conclusion of the visit. If applicable, a reminder letter will be sent to the patient regarding the next appointment. BI-RADS CATEGORY  4: Suspicious. Electronically Signed   By: Nolon Nations M.D.   On: 03/15/2017 16:35   Mm Clip Placement Right  Result Date: 03/16/2017 CLINICAL DATA:  Status post ultrasound-guided core biopsy of mass in the 930 o'clock location of the right breast. EXAM: DIAGNOSTIC RIGHT MAMMOGRAM POST ULTRASOUND BIOPSY COMPARISON:  Previous exam(s). FINDINGS: Mammographic images were obtained following ultrasound guided biopsy of mass in the 930 o'clock location of the right breast. Ribbon shaped clip is identified within the area of distortion calcifications in the upper-outer quadrant of the right breast. IMPRESSION: Tissue marker clip in the expected location following biopsy. Final Assessment: Post Procedure Mammograms for Marker Placement Electronically Signed   By: Nolon Nations M.D.   On: 03/16/2017 16:06   [place] Rt Breast Bx W Loc Dev 1st Lesion Img Bx Spec US Guide  Addendum Date: 03/20/2017   ADDENDUM REPORT: 03/19/2017 13:59 ADDENDUM: Pathology revealed GRADE II INVASIVE MAMMARY CARCINOMA of the Right breast, 9:30 o'clock, (ribbon clip placed). This was found to be concordant by Dr. Nolon Nations. Pathology results were discussed with the patient by telephone. The patient reported doing well after the biopsy with tenderness at the site. Post biopsy instructions and care were reviewed and questions were answered. The patient was encouraged to call [facility] for any additional concerns. The patient was referred to [facility] on March 28, 2017. Pathology results reported by Terie Purser, RN on 03/19/2017.  Electronically Signed   By: Nolon Nations M.D.   On: 03/19/2017 13:59   Result Date: 03/20/2017 CLINICAL DATA:  The patient presents for ultrasound-guided core biopsy of mass in the 930 o'clock location of the right breast. EXAM: ULTRASOUND GUIDED RIGHT BREAST CORE NEEDLE BIOPSY COMPARISON:  Previous exam(s). FINDINGS: I met with the patient and we discussed the procedure of ultrasound-guided biopsy, including benefits and alternatives. We discussed the high likelihood of a successful procedure. We discussed the risks of the procedure, including infection, bleeding, tissue injury, clip migration, and inadequate sampling. Informed written consent was given. The usual time-out protocol was performed immediately prior to the procedure. Lesion quadrant: Upper-outer quadrant right breast Using sterile technique and  1% Lidocaine as local anesthetic, under direct ultrasound visualization, a 14 gauge spring-loaded device was used to perform biopsy of mass in the 930 o'clock location of the right breast using a spot lateral approach. At the conclusion of the procedure a ribbon shaped tissue marker clip was deployed into the biopsy cavity. Follow up 2 view mammogram was performed and dictated separately. IMPRESSION: Ultrasound guided biopsy of right breast mass. No apparent complications. Electronically Signed: By: Nolon Nations M.D. On: 03/16/2017 15:52      IMPRESSION:  Malignant neoplasm of upper-outer quadrant of right breast in female, estrogen receptor positive ([place]) Staging form: Breast, AJCC 8th Edition - Clinical stage from 03/16/2017: Stage IA (cT1b, cN0, cM0, G2, ER: Positive, PR: Positive, HER2: Negative)   Patient will be good candidate for breast conservation; lumpectomy, sentinel node, followed by radiation treatment directed at the right breast. With the diagnosis of invasive lobular, patient will proceed with MRI prior to her surgery. We discussed the course of treatment and possible side  effects, potential toxicities of radiation therapy. Patient appeared to understand and will continue course of treatment.  PLAN:  1. MRI 2. Right lumpectomy and sentinel node procedure 3. Oncotype DX testing to determine the beneift of adjuvant chemotherapy 4. adjuvant radiation therapy  5. Aromatase inhibitor    ------------------------------------------------  Blair Promise, PhD, MD  This document serves as a record of services personally performed by [name] MD. It was created on his behalf by Delton Coombes, a trained medical scribe. The creation of this record is based on the scribe's personal observations and the provider's statements to them.

## 2017-03-28 NOTE — Progress Notes (Signed)
Nutrition Assessment  Reason for Assessment:  Pt seen in Breast Clinic  ASSESSMENT:   51 year old female with new diagnosis of breast cancer.  Past medical history of ulcerative colitis, HTN, borderline diabetes.  Patient reports good appetite but eating more junk lately due to stress eating and busy lifestyle.  Medications:  reviewed  Labs: glucose 160  Anthropometrics:   Height: 63 inches Weight: 182 lb 4.8 oz  BMI: 32.3   NUTRITION DIAGNOSIS: Food and nutrition related knowledge deficit related to new diagnosis of breast cancer as evidenced by no prior need for nutrition related information.  INTERVENTION:   Discussed and provided packet of information regarding nutritional tips for breast cancer patients.  Questions answered.  Teachback method used.  Contact information provided and patient knows to contact me with questions/concerns.    MONITORING, EVALUATION, and GOAL: Pt will consume a healthy plant based diet to maintain lean body mass throughout treatment.   Kory Panjwani B. Zenia Resides, [place], [place] Registered Dietitian [phone] (pager)

## 2017-04-03 ENCOUNTER — Other Ambulatory Visit: Payer: Self-pay

## 2017-04-03 ENCOUNTER — Encounter (HOSPITAL_BASED_OUTPATIENT_CLINIC_OR_DEPARTMENT_OTHER): Payer: Self-pay | Admitting: *Deleted

## 2017-04-03 ENCOUNTER — Other Ambulatory Visit: Payer: Self-pay | Admitting: General Surgery

## 2017-04-03 ENCOUNTER — Telehealth: Payer: Self-pay | Admitting: Hematology

## 2017-04-03 ENCOUNTER — Telehealth: Payer: Self-pay | Admitting: *Deleted

## 2017-04-03 DIAGNOSIS — Z170 Estrogen receptor positive status [ER+]: Secondary | ICD-10-CM

## 2017-04-03 DIAGNOSIS — C50411 Malignant neoplasm of upper-outer quadrant of right female breast: Secondary | ICD-10-CM

## 2017-04-03 NOTE — Telephone Encounter (Signed)
No los °

## 2017-04-03 NOTE — Telephone Encounter (Signed)
  Oncology Nurse Navigator Documentation  Navigator Location: [facility] (04/03/17 1600)   )Navigator Encounter Type: Telephone (04/03/17 1600) Telephone: Outgoing Call (04/03/17 1600)       Genetic Counseling Date: 04/16/17 (04/03/17 1600) Genetic Counseling Type: Non-Urgent (04/03/17 1600)                                        Time Spent with Patient: 15 (04/03/17 1600)

## 2017-04-04 ENCOUNTER — Inpatient Hospital Stay: Admission: RE | Admit: 2017-04-04 | Payer: Self-pay | Source: Ambulatory Visit

## 2017-04-04 ENCOUNTER — Ambulatory Visit
Admission: RE | Admit: 2017-04-04 | Discharge: 2017-04-04 | Disposition: A | Discharge status: Home / Self Care | Payer: BLUE CROSS/BLUE SHIELD | Source: Ambulatory Visit | Attending: General Surgery | Admitting: General Surgery

## 2017-04-04 DIAGNOSIS — Z170 Estrogen receptor positive status [ER+]: Secondary | ICD-10-CM

## 2017-04-04 DIAGNOSIS — C50411 Malignant neoplasm of upper-outer quadrant of right female breast: Secondary | ICD-10-CM

## 2017-04-04 MED ORDER — GADOBENATE DIMEGLUMINE 529 MG/ML IV SOLN
17.0000 mL | Freq: Once | INTRAVENOUS | Status: AC | PRN
  Administered 2017-04-04: 17 mL via INTRAVENOUS

## 2017-04-04 NOTE — Progress Notes (Signed)
Please let patient know that there is no additional cancer seen on her MRI.

## 2017-04-09 ENCOUNTER — Encounter: Payer: Self-pay | Admitting: Family Medicine

## 2017-04-09 DIAGNOSIS — E039 Hypothyroidism, unspecified: Secondary | ICD-10-CM

## 2017-04-16 ENCOUNTER — Other Ambulatory Visit: Payer: Self-pay

## 2017-04-16 ENCOUNTER — Encounter: Payer: Self-pay | Admitting: Genetic Counselor

## 2017-04-16 ENCOUNTER — Encounter (HOSPITAL_BASED_OUTPATIENT_CLINIC_OR_DEPARTMENT_OTHER): Payer: Self-pay | Admitting: *Deleted

## 2017-04-17 DIAGNOSIS — C801 Malignant (primary) neoplasm, unspecified: Secondary | ICD-10-CM | POA: Insufficient documentation

## 2017-04-17 DIAGNOSIS — Z923 Personal history of irradiation: Secondary | ICD-10-CM | POA: Insufficient documentation

## 2017-04-17 HISTORY — DX: Personal history of irradiation: Z92.3

## 2017-04-17 HISTORY — PX: BREAST LUMPECTOMY: SHX2

## 2017-04-17 HISTORY — DX: Malignant (primary) neoplasm, unspecified: C80.1

## 2017-04-23 ENCOUNTER — Ambulatory Visit
Admission: RE | Admit: 2017-04-23 | Discharge: 2017-04-23 | Disposition: A | Discharge status: Home / Self Care | Payer: BLUE CROSS/BLUE SHIELD | Source: Ambulatory Visit | Attending: General Surgery | Admitting: General Surgery

## 2017-04-23 DIAGNOSIS — C50411 Malignant neoplasm of upper-outer quadrant of right female breast: Secondary | ICD-10-CM

## 2017-04-23 DIAGNOSIS — Z170 Estrogen receptor positive status [ER+]: Secondary | ICD-10-CM

## 2017-04-23 NOTE — H&P (Signed)
Erin Fields 03/28/2017 7:50 AM Location: [facility] Patient #: [id number] DOB: Jan 20, 1966 Undefined / Language: Erin Fields / Race: White Female   History of Present Illness [name] MD; 03/28/2017 12:19 PM) The patient is a 52 year old female who presents with breast cancer. Patient is a 52 year old female referred by Dr. Dr. Edilia Bo for a new diagnosis of right breast cancer. The patient had a normal mammogram in April of this year. She palpated a small mass in the outer right breast and brought it to medical attention. Diagnostic ultrasound and mammogram were performed showing a 9 mm mass at 930 o'clock. A core needle biopsy was performed which showed invasive lobular cancer, grade 2, ER and PR positive, HER-2 negative. Ki-67 was 3%. She has a history of ulcerative colitis.   She is not have any history of breast problems. Her mother had breast cancer in her 2s. She has a personal history of squamous cell carcinoma of the skin. She has had 2 children with the first child at age 65. She had menarche at age 28 and is no longer having periods. She did use hormonal contraception for 30 years and has been taking hormone replacement until she received her breast cancer diagnosis. She is up-to-date with a colonoscopy as well as bone density studies. She also is up-to-date with her Pap smear. She started getting mammograms at age 51. 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 She is accompanied by her sister. Her son lives in [place].   pathology 03/16/2017  Diagnosis Breast, right, needle core biopsy, 9:30 o'clock, ribbon clip placed - INVASIVE MAMMARY CARCINOMA. - SEE COMMENT. Microscopic Comment The carcinoma appears grade II. The malignant cells are negative for E-cadherin, supporting a lobular phenotype. (JBK:ecj 03/19/2017) Results: IMMUNOHISTOCHEMICAL AND MORPHOMETRIC ANALYSIS PERFORMED MANUALLY Estrogen Receptor: 95%, POSITIVE, STRONG STAINING INTENSITY Progesterone  Receptor: 95%, POSITIVE, STRONG STAINING INTENSITY Proliferation Marker Ki67: 3% HER2 - NEGATIVE  Dx mammogram/us 03/15/2017 ACR Breast Density Category b: There are scattered areas of fibroglandular density.  FINDINGS: Within the upper-outer quadrant of the right breast, there is asymmetric density associated with distortion and punctate and layering calcifications. This area of asymmetry corresponds to the area of patient's concern.  Mammographic images were processed with CAD.  On physical exam, there is a focal area of thickening in the 930 o'clock location of the right breast, best palpated when the patient is sitting.  Targeted ultrasound is performed, showing an irregular hypoechoic mass in the 930 o'clock location of the right breast 7 cm from the nipple associated with posterior acoustic shadowing measuring 0.8 x 0.9 x 0.9 cm. There is associated internal vascularity. Evaluation of the axilla is negative for adenopathy.  IMPRESSION: Palpable abnormality corresponds to distortion and focal mass associated acoustic shadowing in the 930 o'clock location of the right breast.  CMET, CBC essentially normal 12/12 except for Gluc 160.   Past Surgical History Erin Pummel, RN; 03/28/2017 7:50 AM) Breast Biopsy  Right. Colon Polyp Removal - Colonoscopy  Foot Surgery  Right. Oral Surgery  Tonsillectomy   Diagnostic Studies History Erin Pummel, RN; 03/28/2017 7:50 AM) Colonoscopy  within last year Mammogram  within last year  Medication History Erin Pummel, RN; 03/28/2017 7:50 AM) Medications Reconciled  Social History Erin Pummel, RN; 03/28/2017 7:50 AM) Alcohol use  Occasional alcohol use. Caffeine use  Coffee. Tobacco use  Former smoker.  Family History Erin Pummel, RN; 03/28/2017 7:50 AM) Arthritis  Mother. Breast Cancer  Mother. Depression  Mother. Diabetes Mellitus  Mother. Heart Disease  Mother. Heart disease in female  family member before age 39  Hypertension  Mother. Respiratory Condition  Mother.  Pregnancy / Birth History Erin Pummel, RN; 03/28/2017 7:50 AM) Durenda Age  3 Para  2  Other Problems Erin Pummel, RN; 03/28/2017 7:50 AM) Anxiety Disorder  Arthritis  Asthma  Diverticulosis  Gastroesophageal Reflux Disease  High blood pressure  Hypercholesterolemia  Melanoma  Thyroid Disease     Review of Systems Erin Spillers Ledford RN; 03/28/2017 7:50 AM) General Present- Fatigue and Night Sweats. Not Present- Appetite Loss, Chills, Fever, Weight Gain and Weight Loss. Skin Not Present- Change in Wart/Mole, Dryness, Hives, Jaundice, New Lesions, Non-Healing Wounds, Rash and Ulcer. HEENT Present- Seasonal Allergies, Sinus Pain and Wears glasses/contact lenses. Not Present- Earache, Hearing Loss, Hoarseness, Nose Bleed, Oral Ulcers, Ringing in the Ears, Sore Throat, Visual Disturbances and Yellow Eyes. Respiratory Present- Chronic Cough and Wheezing. Not Present- Bloody sputum, Difficulty Breathing and Snoring. Breast Present- Breast Mass and Breast Pain. Not Present- Nipple Discharge and Skin Changes. Cardiovascular Present- Shortness of Breath and Swelling of Extremities. Not Present- Chest Pain, Difficulty Breathing Lying Down, Leg Cramps, Palpitations and Rapid Heart Rate. Gastrointestinal Present- Indigestion. Not Present- Abdominal Pain, Bloating, Bloody Stool, Change in Bowel Habits, Chronic diarrhea, Constipation, Difficulty Swallowing, Excessive gas, Gets full quickly at meals, Hemorrhoids, Nausea, Rectal Pain and Vomiting. Female Genitourinary Not Present- Frequency, Nocturia, Painful Urination, Pelvic Pain and Urgency. Musculoskeletal Present- Joint Pain. Not Present- Back Pain, Joint Stiffness, Muscle Pain, Muscle Weakness and Swelling of Extremities. Neurological Present- Headaches. Not Present- Decreased Memory, Fainting, Numbness, Seizures, Tingling, Tremor, Trouble walking  and Weakness. Psychiatric Present- Anxiety and Depression. Not Present- Bipolar, Change in Sleep Pattern, Fearful and Frequent crying. Endocrine Present- Hot flashes. Not Present- Cold Intolerance, Excessive Hunger, Hair Changes, Heat Intolerance and New Diabetes. Hematology Present- Blood Thinners and Easy Bruising. Not Present- Excessive bleeding, Gland problems, HIV and Persistent Infections.  Vitals [name] MD; 03/28/2017 12:13 PM) 03/28/2017 12:12 PM Weight: 182.3 lb Height: 63in Body Surface Area: 1.86 m Body Mass Index: 32.29 kg/m  Temp.: 98.29F  Pulse: 112 (Regular)  Resp.: 20 (Unlabored)  P.OX: 98% (Room air) BP: 153/91 (Sitting, Left Arm, Standard)       Physical Exam [name] MD; 03/28/2017 12:21 PM) General Mental Status-Alert. General Appearance-Consistent with stated age. Hydration-Well hydrated. Voice-Normal.  Head and Neck Head-normocephalic, atraumatic with no lesions or palpable masses. Trachea-midline. Thyroid Gland Characteristics - normal size and consistency.  Eye Eyeball - Bilateral-Extraocular movements intact. Sclera/Conjunctiva - Bilateral-No scleral icterus.  Chest and Lung Exam Chest and lung exam reveals -quiet, even and easy respiratory effort with no use of accessory muscles and on auscultation, normal breath sounds, no adventitious sounds and normal vocal resonance. Inspection Chest Wall - Normal. Back - normal.  Breast Note: Breasts are symmetric bilaterally. There is ptosis present. The right breast has bruising laterally and an indistinct BB sized area at 9:00. This does not dimple the skin but does feel very close to the skin. There are no other palpable masses. She does not have any nipple retraction or nipple discharge. There is no other skin dimpling anywhere. She has no axillary lymphadenopathy. Left breast is normal.   Cardiovascular Cardiovascular examination reveals -normal heart  sounds, regular rate and rhythm with no murmurs and normal pedal pulses bilaterally.  Abdomen Inspection Inspection of the abdomen reveals - No Hernias. Palpation/Percussion Palpation and Percussion of the abdomen reveal - Soft, Non Tender, No Rebound tenderness, No Rigidity (  guarding) and No hepatosplenomegaly. Auscultation Auscultation of the abdomen reveals - Bowel sounds normal.  Neurologic Neurologic evaluation reveals -alert and oriented x 3 with no impairment of recent or remote memory. Mental Status-Normal.  Musculoskeletal Global Assessment -Note: no gross deformities.  Normal Exam - Left-Upper Extremity Strength Normal and Lower Extremity Strength Normal. Normal Exam - Right-Upper Extremity Strength Normal and Lower Extremity Strength Normal.  Lymphatic Head & Neck  General Head & Neck Lymphatics: Bilateral - Description - Normal. Axillary  General Axillary Region: Bilateral - Description - Normal. Tenderness - Non Tender. Femoral & Inguinal  Generalized Femoral & Inguinal Lymphatics: Bilateral - Description - No Generalized lymphadenopathy.    Assessment & Plan [name] MD; 03/28/2017 12:30 PM) PRIMARY CANCER OF UPPER OUTER QUADRANT OF RIGHT FEMALE BREAST (C50.411) Impression: Patient has a new diagnosis of clinical T1bN0 right breast cancer. Because this is a lobular cancer, we will order a breast MRI. It is also concerning that her mammograms were negative in April. I suspect the reason she was able to feel this is that it is so superficial. She has very fatty replaced breasts and is a good candidate for lumpectomy.  I discussed breast conservation with the patient. I reviewed that breast conservation generally includes placing a seed. Because this mass is small and indistinct, I would still get a radiologic marker for this patient. I reviewed the timing of this including a seed placement the day of surgery or up to 5 days earlier. I also discussed a  pectoral block, and sentinel node injection on the day of surgery. I reviewed that this is an outpatient procedure. I discussed that the patient would have restrictions for approximately a week.  I reviewed risks of surgery including bleeding, infection, damage to adjacent structures, numbness, possible need for additional surgery, possible recurrent cancer, possible heart or lung complications, possible death. The surgical treatment would be followed by external beam radiation and an Oncotype to assess potential need for chemotherapy. The patient is going to see genetics, but she does not want to wait for genetics to do surgery and does not think that she would do a mastectomy if it were positive.  We will do this at the first available opportunity. The patient works at a desk and we discussed potential implications when she might be able to go back to work. If her MRI is negative for anything significantly different, we will hopefully be able to do this next week. Current Plans You are being scheduled for surgery- Our schedulers will call you.  You should hear from our office's scheduling department within 5 working days about the location, date, and time of surgery. We try to make accommodations for patient's preferences in scheduling surgery, but sometimes the OR schedule or the surgeon's schedule prevents [place] from making those accommodations.  If you have not heard from our office [phone]) in 5 working days, call the office and ask for your surgeon's nurse.  If you have other questions about your diagnosis, plan, or surgery, call the office and ask for your surgeon's nurse.  Pt Education - [facility] Breast Cancer Information Given - [facility] "Breast Journey" Package Referred to Genetic Counseling, for evaluation and follow up [employer]). Routine. MRI BREAST BILATERAL ([id number])   Signed by [name], MD (03/28/2017 12:31 PM)

## 2017-04-23 NOTE — Progress Notes (Signed)
Pt in to pick up Pre op Drink. Instructions reviewed.

## 2017-04-24 ENCOUNTER — Ambulatory Visit (HOSPITAL_BASED_OUTPATIENT_CLINIC_OR_DEPARTMENT_OTHER): Payer: BLUE CROSS/BLUE SHIELD

## 2017-04-24 ENCOUNTER — Other Ambulatory Visit: Payer: Self-pay

## 2017-04-24 ENCOUNTER — Encounter (HOSPITAL_BASED_OUTPATIENT_CLINIC_OR_DEPARTMENT_OTHER)
Admission: RE | Disposition: A | Discharge status: Home / Self Care | Payer: Self-pay | Source: Ambulatory Visit | Attending: General Surgery

## 2017-04-24 ENCOUNTER — Encounter (HOSPITAL_BASED_OUTPATIENT_CLINIC_OR_DEPARTMENT_OTHER): Payer: Self-pay | Admitting: Anesthesiology

## 2017-04-24 ENCOUNTER — Ambulatory Visit (HOSPITAL_COMMUNITY)
Admission: RE | Admit: 2017-04-24 | Discharge: 2017-04-24 | Disposition: A | Discharge status: Home / Self Care | Payer: BLUE CROSS/BLUE SHIELD | Source: Ambulatory Visit | Attending: General Surgery | Admitting: General Surgery

## 2017-04-24 ENCOUNTER — Ambulatory Visit
Admission: RE | Admit: 2017-04-24 | Discharge: 2017-04-24 | Disposition: A | Discharge status: Home / Self Care | Payer: BLUE CROSS/BLUE SHIELD | Source: Ambulatory Visit | Attending: General Surgery | Admitting: General Surgery

## 2017-04-24 DIAGNOSIS — I10 Essential (primary) hypertension: Secondary | ICD-10-CM | POA: Diagnosis not present

## 2017-04-24 DIAGNOSIS — Z8249 Family history of ischemic heart disease and other diseases of the circulatory system: Secondary | ICD-10-CM | POA: Insufficient documentation

## 2017-04-24 DIAGNOSIS — E039 Hypothyroidism, unspecified: Secondary | ICD-10-CM | POA: Diagnosis not present

## 2017-04-24 DIAGNOSIS — Z170 Estrogen receptor positive status [ER+]: Secondary | ICD-10-CM

## 2017-04-24 DIAGNOSIS — K219 Gastro-esophageal reflux disease without esophagitis: Secondary | ICD-10-CM | POA: Diagnosis not present

## 2017-04-24 DIAGNOSIS — C773 Secondary and unspecified malignant neoplasm of axilla and upper limb lymph nodes: Secondary | ICD-10-CM | POA: Insufficient documentation

## 2017-04-24 DIAGNOSIS — F329 Major depressive disorder, single episode, unspecified: Secondary | ICD-10-CM | POA: Diagnosis not present

## 2017-04-24 DIAGNOSIS — Z87891 Personal history of nicotine dependence: Secondary | ICD-10-CM | POA: Diagnosis not present

## 2017-04-24 DIAGNOSIS — J45909 Unspecified asthma, uncomplicated: Secondary | ICD-10-CM | POA: Diagnosis not present

## 2017-04-24 DIAGNOSIS — C50911 Malignant neoplasm of unspecified site of right female breast: Secondary | ICD-10-CM | POA: Diagnosis present

## 2017-04-24 DIAGNOSIS — E7800 Pure hypercholesterolemia, unspecified: Secondary | ICD-10-CM | POA: Diagnosis not present

## 2017-04-24 DIAGNOSIS — C439 Malignant melanoma of skin, unspecified: Secondary | ICD-10-CM | POA: Insufficient documentation

## 2017-04-24 DIAGNOSIS — Z803 Family history of malignant neoplasm of breast: Secondary | ICD-10-CM | POA: Diagnosis not present

## 2017-04-24 DIAGNOSIS — F419 Anxiety disorder, unspecified: Secondary | ICD-10-CM | POA: Diagnosis not present

## 2017-04-24 DIAGNOSIS — Z833 Family history of diabetes mellitus: Secondary | ICD-10-CM | POA: Diagnosis not present

## 2017-04-24 DIAGNOSIS — C50411 Malignant neoplasm of upper-outer quadrant of right female breast: Secondary | ICD-10-CM

## 2017-04-24 HISTORY — PX: BREAST LUMPECTOMY: SHX2

## 2017-04-24 HISTORY — DX: Hypothyroidism, unspecified: E03.9

## 2017-04-24 HISTORY — PX: BREAST LUMPECTOMY WITH RADIOACTIVE SEED AND SENTINEL LYMPH NODE BIOPSY: SHX6550

## 2017-04-24 HISTORY — DX: Other specified postprocedural states: Z98.890

## 2017-04-24 HISTORY — DX: Malignant (primary) neoplasm, unspecified: C80.1

## 2017-04-24 HISTORY — DX: Anxiety disorder, unspecified: F41.9

## 2017-04-24 HISTORY — DX: Other specified postprocedural states: R11.2

## 2017-04-24 SURGERY — BREAST LUMPECTOMY WITH RADIOACTIVE SEED AND SENTINEL LYMPH NODE BIOPSY
Anesthesia: General | Site: Breast | Laterality: Right

## 2017-04-24 MED ORDER — SUCCINYLCHOLINE CHLORIDE 200 MG/10ML IV SOSY
PREFILLED_SYRINGE | INTRAVENOUS | Status: DC | PRN
  Administered 2017-04-24: 120 mg via INTRAVENOUS

## 2017-04-24 MED ORDER — ACETAMINOPHEN 650 MG RE SUPP: 650.0000 mg | RECTAL | Status: DC | PRN

## 2017-04-24 MED ORDER — EPHEDRINE 5 MG/ML INJ
INTRAVENOUS | Status: AC
  Filled 2017-04-24: qty 10

## 2017-04-24 MED ORDER — CHLORHEXIDINE GLUCONATE CLOTH 2 % EX PADS: 6.0000 | MEDICATED_PAD | Freq: Once | CUTANEOUS | Status: DC

## 2017-04-24 MED ORDER — PHENYLEPHRINE 40 MCG/ML (10ML) SYRINGE FOR IV PUSH (FOR BLOOD PRESSURE SUPPORT)
PREFILLED_SYRINGE | INTRAVENOUS | Status: AC
  Filled 2017-04-24: qty 10

## 2017-04-24 MED ORDER — LIDOCAINE 2% (20 MG/ML) 5 ML SYRINGE
INTRAMUSCULAR | Status: DC | PRN
  Administered 2017-04-24: 100 mg via INTRAVENOUS

## 2017-04-24 MED ORDER — FENTANYL CITRATE (PF) 100 MCG/2ML IJ SOLN: 50.0000 ug | INTRAMUSCULAR | Status: DC | PRN

## 2017-04-24 MED ORDER — METHYLENE BLUE 0.5 % INJ SOLN
INTRAVENOUS | Status: AC
  Filled 2017-04-24: qty 10

## 2017-04-24 MED ORDER — PROMETHAZINE HCL 25 MG/ML IJ SOLN
INTRAMUSCULAR | Status: AC
  Filled 2017-04-24: qty 1

## 2017-04-24 MED ORDER — MIDAZOLAM HCL 2 MG/2ML IJ SOLN
INTRAMUSCULAR | Status: AC
  Filled 2017-04-24: qty 2

## 2017-04-24 MED ORDER — METOCLOPRAMIDE HCL 5 MG/ML IJ SOLN
10.0000 mg | Freq: Once | INTRAMUSCULAR | Status: AC | PRN
  Administered 2017-04-24: 10 mg via INTRAVENOUS

## 2017-04-24 MED ORDER — DEXAMETHASONE SODIUM PHOSPHATE 10 MG/ML IJ SOLN
INTRAMUSCULAR | Status: AC
  Filled 2017-04-24: qty 1

## 2017-04-24 MED ORDER — TECHNETIUM TC 99M SULFUR COLLOID FILTERED: 1.0000 | Freq: Once | INTRAVENOUS | Status: DC | PRN

## 2017-04-24 MED ORDER — LIDOCAINE HCL (PF) 1 % IJ SOLN
INTRAMUSCULAR | Status: AC
  Filled 2017-04-24: qty 30

## 2017-04-24 MED ORDER — PHENYLEPHRINE 40 MCG/ML (10ML) SYRINGE FOR IV PUSH (FOR BLOOD PRESSURE SUPPORT)
PREFILLED_SYRINGE | INTRAVENOUS | Status: DC | PRN
  Administered 2017-04-24 (×5): 80 ug via INTRAVENOUS

## 2017-04-24 MED ORDER — EPHEDRINE SULFATE-NACL 50-0.9 MG/10ML-% IV SOSY
PREFILLED_SYRINGE | INTRAVENOUS | Status: DC | PRN
  Administered 2017-04-24: 10 mg via INTRAVENOUS

## 2017-04-24 MED ORDER — BUPIVACAINE-EPINEPHRINE 0.25% -1:200000 IJ SOLN
INTRAMUSCULAR | Status: AC
  Filled 2017-04-24: qty 4

## 2017-04-24 MED ORDER — LACTATED RINGERS IV SOLN
INTRAVENOUS | Status: DC
  Administered 2017-04-24: 10:00:00 via INTRAVENOUS

## 2017-04-24 MED ORDER — SUCCINYLCHOLINE CHLORIDE 200 MG/10ML IV SOSY: PREFILLED_SYRINGE | INTRAVENOUS | Status: DC | PRN

## 2017-04-24 MED ORDER — OXYCODONE HCL 5 MG PO TABS: 5.0000 mg | ORAL_TABLET | Freq: Four times a day (QID) | ORAL | 0 refills | Status: DC | PRN

## 2017-04-24 MED ORDER — ONDANSETRON HCL 4 MG/2ML IJ SOLN
INTRAMUSCULAR | Status: DC | PRN
  Administered 2017-04-24: 4 mg via INTRAVENOUS

## 2017-04-24 MED ORDER — LIDOCAINE 2% (20 MG/ML) 5 ML SYRINGE
INTRAMUSCULAR | Status: AC
  Filled 2017-04-24: qty 5

## 2017-04-24 MED ORDER — CIPROFLOXACIN IN D5W 400 MG/200ML IV SOLN
INTRAVENOUS | Status: AC
  Filled 2017-04-24: qty 200

## 2017-04-24 MED ORDER — FENTANYL CITRATE (PF) 100 MCG/2ML IJ SOLN
INTRAMUSCULAR | Status: AC
  Filled 2017-04-24: qty 2

## 2017-04-24 MED ORDER — CIPROFLOXACIN IN D5W 400 MG/200ML IV SOLN
400.0000 mg | INTRAVENOUS | Status: AC
  Administered 2017-04-24: 400 mg via INTRAVENOUS

## 2017-04-24 MED ORDER — LACTATED RINGERS IV SOLN
INTRAVENOUS | Status: DC
  Administered 2017-04-24 (×2): via INTRAVENOUS

## 2017-04-24 MED ORDER — GABAPENTIN 300 MG PO CAPS
ORAL_CAPSULE | ORAL | Status: AC
  Filled 2017-04-24: qty 1

## 2017-04-24 MED ORDER — ACETAMINOPHEN 500 MG PO TABS
ORAL_TABLET | ORAL | Status: AC
  Filled 2017-04-24: qty 2

## 2017-04-24 MED ORDER — SCOPOLAMINE 1 MG/3DAYS TD PT72: 1.0000 | MEDICATED_PATCH | Freq: Once | TRANSDERMAL | Status: DC | PRN

## 2017-04-24 MED ORDER — PROPOFOL 10 MG/ML IV BOLUS
INTRAVENOUS | Status: DC | PRN
  Administered 2017-04-24: 160 mg via INTRAVENOUS

## 2017-04-24 MED ORDER — MIDAZOLAM HCL 2 MG/2ML IJ SOLN
1.0000 mg | INTRAMUSCULAR | Status: DC | PRN
  Administered 2017-04-24: 2 mg via INTRAVENOUS
  Administered 2017-04-24: 1 mg via INTRAVENOUS

## 2017-04-24 MED ORDER — LIDOCAINE-EPINEPHRINE (PF) 1 %-1:200000 IJ SOLN
INTRAMUSCULAR | Status: DC | PRN
  Administered 2017-04-24: 20 mL via INTRAMUSCULAR

## 2017-04-24 MED ORDER — ACETAMINOPHEN 500 MG PO TABS
1000.0000 mg | ORAL_TABLET | ORAL | Status: AC
  Administered 2017-04-24: 1000 mg via ORAL

## 2017-04-24 MED ORDER — SODIUM CHLORIDE 0.9 % IV SOLN: 250.0000 mL | INTRAVENOUS | Status: DC | PRN

## 2017-04-24 MED ORDER — SODIUM CHLORIDE 0.9% FLUSH: 3.0000 mL | INTRAVENOUS | Status: DC | PRN

## 2017-04-24 MED ORDER — FENTANYL CITRATE (PF) 100 MCG/2ML IJ SOLN: 25.0000 ug | INTRAMUSCULAR | Status: DC | PRN

## 2017-04-24 MED ORDER — METOCLOPRAMIDE HCL 5 MG/ML IJ SOLN
INTRAMUSCULAR | Status: AC
  Filled 2017-04-24: qty 2

## 2017-04-24 MED ORDER — SODIUM CHLORIDE 0.9% FLUSH: 3.0000 mL | Freq: Two times a day (BID) | INTRAVENOUS | Status: DC

## 2017-04-24 MED ORDER — DEXAMETHASONE SODIUM PHOSPHATE 10 MG/ML IJ SOLN
INTRAMUSCULAR | Status: DC | PRN
  Administered 2017-04-24: 10 mg via INTRAVENOUS

## 2017-04-24 MED ORDER — SCOPOLAMINE 1 MG/3DAYS TD PT72
MEDICATED_PATCH | TRANSDERMAL | Status: AC
  Filled 2017-04-24: qty 1

## 2017-04-24 MED ORDER — OXYCODONE HCL 5 MG PO TABS
ORAL_TABLET | ORAL | Status: AC
  Filled 2017-04-24: qty 1

## 2017-04-24 MED ORDER — PROPOFOL 10 MG/ML IV BOLUS
INTRAVENOUS | Status: AC
  Filled 2017-04-24: qty 40

## 2017-04-24 MED ORDER — MEPERIDINE HCL 25 MG/ML IJ SOLN: 6.2500 mg | INTRAMUSCULAR | Status: DC | PRN

## 2017-04-24 MED ORDER — MIDAZOLAM HCL 2 MG/2ML IJ SOLN: 1.0000 mg | INTRAMUSCULAR | Status: DC | PRN

## 2017-04-24 MED ORDER — PROMETHAZINE HCL 25 MG/ML IJ SOLN
6.2500 mg | Freq: Once | INTRAMUSCULAR | Status: AC
  Administered 2017-04-24: 6.25 mg via INTRAVENOUS

## 2017-04-24 MED ORDER — FENTANYL CITRATE (PF) 100 MCG/2ML IJ SOLN
50.0000 ug | INTRAMUSCULAR | Status: DC | PRN
  Administered 2017-04-24: 50 ug via INTRAVENOUS
  Administered 2017-04-24: 100 ug via INTRAVENOUS

## 2017-04-24 MED ORDER — SCOPOLAMINE 1 MG/3DAYS TD PT72: 1.0000 | MEDICATED_PATCH | TRANSDERMAL | Status: DC

## 2017-04-24 MED ORDER — ACETAMINOPHEN 325 MG PO TABS: 650.0000 mg | ORAL_TABLET | ORAL | Status: DC | PRN

## 2017-04-24 MED ORDER — SODIUM CHLORIDE 0.9 % IJ SOLN
INTRAMUSCULAR | Status: AC
  Filled 2017-04-24: qty 10

## 2017-04-24 MED ORDER — SCOPOLAMINE 1 MG/3DAYS TD PT72
1.0000 | MEDICATED_PATCH | Freq: Once | TRANSDERMAL | Status: DC | PRN
  Administered 2017-04-24: 1.5 mg via TRANSDERMAL

## 2017-04-24 MED ORDER — LACTATED RINGERS IV SOLN: INTRAVENOUS | Status: DC

## 2017-04-24 MED ORDER — ONDANSETRON HCL 4 MG/2ML IJ SOLN
INTRAMUSCULAR | Status: AC
  Filled 2017-04-24: qty 2

## 2017-04-24 MED ORDER — GABAPENTIN 300 MG PO CAPS
300.0000 mg | ORAL_CAPSULE | ORAL | Status: AC
  Administered 2017-04-24: 300 mg via ORAL

## 2017-04-24 MED ORDER — ROPIVACAINE HCL 5 MG/ML IJ SOLN
INTRAMUSCULAR | Status: DC | PRN
  Administered 2017-04-24: 30 mL via PERINEURAL

## 2017-04-24 MED ORDER — OXYCODONE HCL 5 MG PO TABS
5.0000 mg | ORAL_TABLET | ORAL | Status: DC | PRN
  Administered 2017-04-24: 5 mg via ORAL

## 2017-04-24 SURGICAL SUPPLY — 64 items
ADH SKN CLS APL DERMABOND .7 (GAUZE/BANDAGES/DRESSINGS) ×1
BINDER BREAST MEDIUM (GAUZE/BANDAGES/DRESSINGS) IMPLANT
BINDER BREAST XLRG (GAUZE/BANDAGES/DRESSINGS) IMPLANT
BINDER BREAST XXLRG (GAUZE/BANDAGES/DRESSINGS) ×1 IMPLANT
BLADE SURG 10 STRL SS (BLADE) ×2 IMPLANT
BLADE SURG 15 STRL LF DISP TIS (BLADE) ×1 IMPLANT
BLADE SURG 15 STRL SS (BLADE) ×2
BNDG COHESIVE 4X5 TAN STRL (GAUZE/BANDAGES/DRESSINGS) ×2 IMPLANT
CANISTER SUC SOCK COL 7IN (MISCELLANEOUS) IMPLANT
CANISTER SUCT 1200ML W/VALVE (MISCELLANEOUS) ×2 IMPLANT
CHLORAPREP W/TINT 26ML (MISCELLANEOUS) ×2 IMPLANT
CLIP VESOCCLUDE LG 6/CT (CLIP) ×2 IMPLANT
CLIP VESOCCLUDE MED 6/CT (CLIP) ×5 IMPLANT
CLIP VESOCCLUDE SM WIDE 6/CT (CLIP) IMPLANT
COVER MAYO STAND STRL (DRAPES) ×2 IMPLANT
COVER PROBE W GEL 5X96 (DRAPES) ×2 IMPLANT
DECANTER SPIKE VIAL GLASS SM (MISCELLANEOUS) IMPLANT
DERMABOND ADVANCED (GAUZE/BANDAGES/DRESSINGS) ×1
DERMABOND ADVANCED .7 DNX12 (GAUZE/BANDAGES/DRESSINGS) ×1 IMPLANT
DEVICE DUBIN W/COMP PLATE 8390 (MISCELLANEOUS) ×2 IMPLANT
DRAPE SURG 17X23 STRL (DRAPES) ×2 IMPLANT
DRAPE UTILITY XL STRL (DRAPES) ×2 IMPLANT
DRSG PAD ABDOMINAL 8X10 ST (GAUZE/BANDAGES/DRESSINGS) ×2 IMPLANT
ELECT COATED BLADE 2.86 ST (ELECTRODE) ×2 IMPLANT
ELECT REM PT RETURN 9FT ADLT (ELECTROSURGICAL) ×2
ELECTRODE REM PT RTRN 9FT ADLT (ELECTROSURGICAL) ×1 IMPLANT
GAUZE SPONGE 4X4 12PLY STRL LF (GAUZE/BANDAGES/DRESSINGS) ×2 IMPLANT
GLOVE BIO SURGEON STRL SZ 6 (GLOVE) ×2 IMPLANT
GLOVE BIO SURGEON STRL SZ 6.5 (GLOVE) ×1 IMPLANT
GLOVE BIOGEL PI IND STRL 6.5 (GLOVE) ×1 IMPLANT
GLOVE BIOGEL PI IND STRL 7.0 (GLOVE) ×1 IMPLANT
GLOVE BIOGEL PI INDICATOR 6.5 (GLOVE) ×1
GLOVE BIOGEL PI INDICATOR 7.0 (GLOVE) ×1
GOWN STRL REUS W/ TWL LRG LVL3 (GOWN DISPOSABLE) ×1 IMPLANT
GOWN STRL REUS W/TWL 2XL LVL3 (GOWN DISPOSABLE) ×2 IMPLANT
GOWN STRL REUS W/TWL LRG LVL3 (GOWN DISPOSABLE) ×2
KIT MARKER MARGIN INK (KITS) ×2 IMPLANT
LIGHT WAVEGUIDE WIDE FLAT (MISCELLANEOUS) ×1 IMPLANT
NDL HYPO 25X1 1.5 SAFETY (NEEDLE) ×1 IMPLANT
NDL SAFETY ECLIPSE 18X1.5 (NEEDLE) IMPLANT
NEEDLE HYPO 18GX1.5 SHARP (NEEDLE) ×2
NEEDLE HYPO 25X1 1.5 SAFETY (NEEDLE) ×2 IMPLANT
NS IRRIG 1000ML POUR BTL (IV SOLUTION) ×2 IMPLANT
PACK BASIN DAY SURGERY FS (CUSTOM PROCEDURE TRAY) ×2 IMPLANT
PACK UNIVERSAL I (CUSTOM PROCEDURE TRAY) ×2 IMPLANT
PENCIL BUTTON HOLSTER BLD 10FT (ELECTRODE) ×2 IMPLANT
SLEEVE SCD COMPRESS KNEE MED (MISCELLANEOUS) ×2 IMPLANT
SPONGE LAP 18X18 X RAY DECT (DISPOSABLE) ×4 IMPLANT
STAPLER VISISTAT 35W (STAPLE) IMPLANT
STOCKINETTE IMPERVIOUS LG (DRAPES) ×2 IMPLANT
STRIP CLOSURE SKIN 1/2X4 (GAUZE/BANDAGES/DRESSINGS) ×2 IMPLANT
SUT ETHILON 2 0 FS 18 (SUTURE) IMPLANT
SUT MNCRL AB 4-0 PS2 18 (SUTURE) ×2 IMPLANT
SUT MON AB 5-0 PS2 18 (SUTURE) IMPLANT
SUT SILK 2 0 SH (SUTURE) IMPLANT
SUT VIC AB 2-0 SH 27 (SUTURE) ×2
SUT VIC AB 2-0 SH 27XBRD (SUTURE) ×1 IMPLANT
SUT VIC AB 3-0 SH 27 (SUTURE) ×2
SUT VIC AB 3-0 SH 27X BRD (SUTURE) ×1 IMPLANT
SUT VICRYL 3-0 CR8 SH (SUTURE) ×2 IMPLANT
SYR CONTROL 10ML LL (SYRINGE) ×2 IMPLANT
TOWEL OR 17X24 6PK STRL BLUE (TOWEL DISPOSABLE) ×2 IMPLANT
TUBE CONNECTING 20X1/4 (TUBING) ×2 IMPLANT
YANKAUER SUCT BULB TIP NO VENT (SUCTIONS) ×2 IMPLANT

## 2017-04-24 NOTE — Op Note (Signed)
Right Breast Radioactive seed localized lumpectomy and sentinel lymph node biopsy  Indications: This patient presents with history of right breast cancer, upper outer quadrant, lobular cT1bN0, +/+/-  Pre-operative Diagnosis: right breast cancer  Post-operative Diagnosis: Same  Surgeon: [name]   Anesthesia: General endotracheal anesthesia  ASA Class: 2  Procedure Details  The patient was seen in the Holding Room. The risks, benefits, complications, treatment options, and expected outcomes were discussed with the patient. The possibilities of bleeding, infection, the need for additional procedures, failure to diagnose a condition, and creating a complication requiring transfusion or operation were discussed with the patient. The patient concurred with the proposed plan, giving informed consent.  The site of surgery properly noted/marked. The patient was taken to Operating Room # 8, identified, and the procedure verified as Right Breast Seed localized Lumpectomy with sentinel lymph node biopsy. A Time Out was held and the above information confirmed.  The right arm, breast, and chest were prepped and draped in standard fashion. The lumpectomy was performed by creating an transverse incision over the lateral breast over the previously placed radioactive seed.  Dissection was carried down to around the point of maximum signal intensity. The cautery was used to perform the dissection.  Hemostasis was achieved with cautery. The edges of the cavity were marked with large clips, with one each medial, lateral, inferior and superior, and two clips posteriorly.   The specimen was inked with the margin marker paint kit.    Specimen radiography confirmed inclusion of the mammographic lesion, the clip, and the seed.  The medial border was still quite firm, so additional medial margin was taken with the cautery.  The background signal in the breast was zero.  The wound was irrigated and closed with 3-0 vicryl  in layers and 4-0 monocryl subcuticular suture.    Using a hand-held gamma probe, right axillary sentinel nodes were identified transcutaneously.  An oblique incision was created below the axillary hairline.  Dissection was carried through the clavipectoral fascia.  Three deep level 2 axillary sentinel nodes were removed.  Counts per second were 2200, 50, and 210.    The background count was 12 cps.  The wound was irrigated.  Hemostasis was achieved with cautery.  The axillary incision was closed with a 3-0 vicryl deep dermal interrupted sutures and a 4-0 monocryl subcuticular closure.    Sterile dressings were applied. At the end of the operation, all sponge, instrument, and needle counts were correct.  Findings: grossly clear surgical margins and no adenopathy.  (posterior margin is petoralis)  Estimated Blood Loss:  min         Specimens: right breast lumpectomy, additional medial margin, and three deep right axillary sentinel lymph nodes.             Complications:  None; patient tolerated the procedure well.         Disposition: PACU - hemodynamically stable.         Condition: stable

## 2017-04-24 NOTE — Anesthesia Postprocedure Evaluation (Signed)
Anesthesia Post Note  Patient: Erin Fields  Procedure(s) Performed: RIGHT BREAST LUMPECTOMY WITH RADIOACTIVE SEED AND SENTINEL LYMPH NODE BIOPSY (Right Breast)     Patient location during evaluation: PACU Anesthesia Type: General Level of consciousness: awake and alert Pain management: pain level controlled Vital Signs Assessment: post-procedure vital signs reviewed and stable Respiratory status: spontaneous breathing, nonlabored ventilation, respiratory function stable and patient connected to nasal cannula oxygen Cardiovascular status: blood pressure returned to baseline and stable Postop Assessment: no apparent nausea or vomiting Anesthetic complications: no    Last Vitals:  Vitals:   04/24/17 1045 04/24/17 1100  BP: (!) 143/90   Pulse: 100   Resp: 17   Temp:  36.7 C  SpO2: 91% 95%    Last Pain:  Vitals:   04/24/17 1100  TempSrc:   PainSc: 5                  Montez Hageman

## 2017-04-24 NOTE — Progress Notes (Signed)
Assisted nuc med tech with nuc med inj     Side rails up, monitors on throughout procedure. See vital signs in flow sheet. Tolerated Procedure well.

## 2017-04-24 NOTE — Discharge Instructions (Addendum)
[place] Office Phone Number [phone]  BREAST BIOPSY/ PARTIAL MASTECTOMY: POST OP INSTRUCTIONS  Always review your discharge instruction sheet given to you by the facility where your surgery was performed.  IF YOU HAVE DISABILITY OR FAMILY LEAVE FORMS, YOU MUST BRING THEM TO THE OFFICE FOR PROCESSING.  DO NOT GIVE THEM TO YOUR DOCTOR.  1. A prescription for pain medication may be given to you upon discharge.  Take your pain medication as prescribed, if needed.  If narcotic pain medicine is not needed, then you may take acetaminophen (Tylenol) or ibuprofen (Advil) as needed. 2. Take your usually prescribed medications unless otherwise directed 3. If you need a refill on your pain medication, please contact your pharmacy.  They will contact our office to request authorization.  Prescriptions will not be filled after 5pm or on week-ends. 4. You should eat very light the first 24 hours after surgery, such as soup, crackers, pudding, etc.  Resume your normal diet the day after surgery. 5. Most patients will experience some swelling and bruising in the breast.  Ice packs and a good support bra will help.  Swelling and bruising can take several days to resolve.  6. It is common to experience some constipation if taking pain medication after surgery.  Increasing fluid intake and taking a stool softener will usually help or prevent this problem from occurring.  A mild laxative (Milk of Magnesia or Miralax) should be taken according to package directions if there are no bowel movements after 48 hours. 7. Unless discharge instructions indicate otherwise, you may remove your bandages 48 hours after surgery, and you may shower at that time.  You may have steri-strips (small skin tapes) in place directly over the incision.  These strips should be left on the skin for 7-10 days.   Any sutures or staples will be removed at the office during your follow-up visit. 8. ACTIVITIES:  You may resume  regular daily activities (gradually increasing) beginning the next day.  Wearing a good support bra or sports bra (or the breast binder) minimizes pain and swelling.  You may have sexual intercourse when it is comfortable. a. You may drive when you no longer are taking prescription pain medication, you can comfortably wear a seatbelt, and you can safely maneuver your car and apply brakes. b. RETURN TO WORK:  __________1 week_______________ 9. You should see your doctor in the office for a follow-up appointment approximately two weeks after your surgery.  Your doctors nurse will typically make your follow-up appointment when she calls you with your pathology report.  Expect your pathology report 2-3 business days after your surgery.  You may call to check if you do not hear from [place] after three days.   WHEN TO CALL YOUR DOCTOR: 1. Fever over 101.0 2. Nausea and/or vomiting. 3. Extreme swelling or bruising. 4. Continued bleeding from incision. 5. Increased pain, redness, or drainage from the incision.  The clinic staff is available to answer your questions during regular business hours.  Please dont hesitate to call and ask to speak to one of the nurses for clinical concerns.  If you have a medical emergency, go to the nearest emergency room or call 911.  A surgeon from [facility] is always on call at the hospital.  For further questions, please visit [email or web address]   Post Anesthesia Home Care Instructions  Activity: Get plenty of rest for the remainder of the day. A responsible individual must stay with you for 24 hours  following the procedure.  For the next 24 hours, DO NOT: -Drive a car -Paediatric nurse -Drink alcoholic beverages -Take any medication unless instructed by your physician -Make any legal decisions or sign important papers.  Meals: Start with liquid foods such as gelatin or soup. Progress to regular foods as tolerated. Avoid greasy, spicy, heavy  foods. If nausea and/or vomiting occur, drink only clear liquids until the nausea and/or vomiting subsides. Call your physician if vomiting continues.  Special Instructions/Symptoms: Your throat may feel dry or sore from the anesthesia or the breathing tube placed in your throat during surgery. If this causes discomfort, gargle with warm salt water. The discomfort should disappear within 24 hours.  If you had a scopolamine patch placed behind your ear for the management of post- operative nausea and/or vomiting:  1. The medication in the patch is effective for 72 hours, after which it should be removed.  Wrap patch in a tissue and discard in the trash. Wash hands thoroughly with soap and water. 2. You may remove the patch earlier than 72 hours if you experience unpleasant side effects which may include dry mouth, dizziness or visual disturbances. 3. Avoid touching the patch. Wash your hands with soap and water after contact with the patch.

## 2017-04-24 NOTE — Progress Notes (Signed)
Assisted Dr. Marcell Barlow with right, ultrasound guided, pectoralis block. Side rails up, monitors on throughout procedure. See vital signs in flow sheet. Tolerated Procedure well.

## 2017-04-24 NOTE — Transfer of Care (Signed)
Immediate Anesthesia Transfer of Care Note  Patient: Erin Fields  Procedure(s) Performed: RIGHT BREAST LUMPECTOMY WITH RADIOACTIVE SEED AND SENTINEL LYMPH NODE BIOPSY (Right Breast)  Patient Location: PACU  Anesthesia Type:General  Level of Consciousness: awake and oriented  Airway & Oxygen Therapy: Patient Spontanous Breathing and Patient connected to nasal cannula oxygen  Post-op Assessment: Report given to RN  Post vital signs: Reviewed and stable  Last Vitals: 139/83, 118, 20, 94% Vitals:   04/24/17 0725 04/24/17 0730  BP: 128/75 133/78  Pulse: 88 90  Resp: 17 18  Temp:    SpO2: 99% 99%    Last Pain:  Vitals:   04/24/17 0703  TempSrc: Oral         Complications: No apparent anesthesia complications

## 2017-04-24 NOTE — Interval H&P Note (Signed)
History and Physical Interval Note:  04/24/2017 7:38 AM  Erin Fields  has presented today for surgery, with the diagnosis of RIGHT BREAST CANCER  The various methods of treatment have been discussed with the patient and family. After consideration of risks, benefits and other options for treatment, the patient has consented to  Procedure(s): RIGHT BREAST LUMPECTOMY WITH RADIOACTIVE SEED AND SENTINEL LYMPH NODE BIOPSY (Right) as a surgical intervention .  The patient's history has been reviewed, patient examined, no change in status, stable for surgery.  I have reviewed the patient's chart and labs.  Questions were answered to the patient's satisfaction.     [name]

## 2017-04-24 NOTE — Anesthesia Preprocedure Evaluation (Addendum)
Anesthesia Evaluation  Patient identified by MRN, date of birth, ID band Patient awake    Reviewed: Allergy & Precautions, NPO status , Patient's Chart, lab work & pertinent test results  History of Anesthesia Complications (+) PONV  Airway Mallampati: II  TM Distance: >3 FB Neck ROM: Full    Dental no notable dental hx.    Pulmonary asthma , former smoker,    Pulmonary exam normal breath sounds clear to auscultation       Cardiovascular hypertension, Pt. on medications Normal cardiovascular exam Rhythm:Regular Rate:Normal     Neuro/Psych negative neurological ROS  negative psych ROS   GI/Hepatic Neg liver ROS, GERD  Poorly Controlled and Medicated,  Endo/Other  Hypothyroidism   Renal/GU negative Renal ROS  negative genitourinary   Musculoskeletal negative musculoskeletal ROS (+)   Abdominal   Peds negative pediatric ROS (+)  Hematology negative hematology ROS (+)   Anesthesia Other Findings   Reproductive/Obstetrics negative OB ROS                             Anesthesia Physical Anesthesia Plan  ASA: II  Anesthesia Plan: General   Post-op Pain Management:  Regional for Post-op pain   Induction: Intravenous  PONV Risk Score and Plan: 4 or greater and Scopolamine patch - Pre-op, Midazolam, Dexamethasone, Ondansetron and Treatment may vary due to age or medical condition  Airway Management Planned: Oral ETT  Additional Equipment:   Intra-op Plan:   Post-operative Plan: Extubation in OR  Informed Consent: I have reviewed the patients History and Physical, chart, labs and discussed the procedure including the risks, benefits and alternatives for the proposed anesthesia with the patient or authorized representative who has indicated his/her understanding and acceptance.   Dental advisory given  Plan Discussed with: CRNA  Anesthesia Plan Comments:        Anesthesia  Quick Evaluation

## 2017-04-24 NOTE — Anesthesia Procedure Notes (Addendum)
Anesthesia Regional Block: Pectoralis block   Pre-Anesthetic Checklist: ,, timeout performed, Correct Patient, Correct Site, Correct Laterality, Correct Procedure, Correct Position, site marked, Risks and benefits discussed,  Surgical consent,  Pre-op evaluation,  At surgeon's request and post-op pain management  Laterality: Right  Prep: Maximum Sterile Barrier Precautions used, chloraprep       Needles:  Injection technique: Single-shot  Needle Type: Echogenic Stimulator Needle     Needle Length: 10cm      Additional Needles:   Procedures:,,,, ultrasound used (permanent image in chart),,,,  Narrative:  Start time: 04/24/2017 7:12 AM End time: 04/24/2017 7:22 AM Injection made incrementally with aspirations every 5 mL.  Performed by: Personally  Anesthesiologist: Montez Hageman, MD  Additional Notes: Risks, benefits and alternative to block explained extensively.  Patient tolerated procedure well, without complications.

## 2017-04-24 NOTE — Anesthesia Procedure Notes (Signed)
Procedure Name: Intubation Date/Time: 04/24/2017 8:10 AM Performed by: Bonney Aid, CRNA Pre-anesthesia Checklist: Patient identified, Emergency Drugs available, Suction available and Patient being monitored Patient Re-evaluated:Patient Re-evaluated prior to induction Oxygen Delivery Method: Circle system utilized Preoxygenation: Pre-oxygenation with 100% oxygen Induction Type: IV induction, Rapid sequence and Cricoid Pressure applied Ventilation: Mask ventilation without difficulty Laryngoscope Size: Mac and 3 Grade View: Grade II Tube type: Oral Tube size: 7.0 mm Number of attempts: 1 Airway Equipment and Method: Stylet and Oral airway Placement Confirmation: ETT inserted through vocal cords under direct vision,  positive ETCO2 and breath sounds checked- equal and bilateral Secured at: 21 cm Tube secured with: Tape Dental Injury: Teeth and Oropharynx as per pre-operative assessment

## 2017-04-25 NOTE — Addendum Note (Signed)
Addendum  created 04/25/17 1013 by Tawni Millers, CRNA   Charge Capture section accepted

## 2017-04-26 ENCOUNTER — Encounter (HOSPITAL_BASED_OUTPATIENT_CLINIC_OR_DEPARTMENT_OTHER): Payer: Self-pay | Admitting: General Surgery

## 2017-04-30 ENCOUNTER — Telehealth: Payer: Self-pay | Admitting: *Deleted

## 2017-04-30 NOTE — Telephone Encounter (Signed)
Ordered oncotype per Dr. Burr Medico and faxed requistion to pathology and confirmed receipt.

## 2017-05-02 ENCOUNTER — Other Ambulatory Visit: Payer: Self-pay | Admitting: General Surgery

## 2017-05-09 ENCOUNTER — Encounter (HOSPITAL_BASED_OUTPATIENT_CLINIC_OR_DEPARTMENT_OTHER): Payer: Self-pay | Admitting: *Deleted

## 2017-05-09 ENCOUNTER — Other Ambulatory Visit: Payer: Self-pay

## 2017-05-10 ENCOUNTER — Telehealth: Payer: Self-pay | Admitting: Genetic Counselor

## 2017-05-10 NOTE — Progress Notes (Signed)
Ensure pre surgery drink given with instructions to complete by 0700 dos, pt verbalized understanding.

## 2017-05-10 NOTE — Telephone Encounter (Signed)
Returned call and spoke to patient to R/S her missed appointment from 12/31.  Appointment now scheduled for March 2019

## 2017-05-14 ENCOUNTER — Telehealth: Payer: Self-pay | Admitting: *Deleted

## 2017-05-14 ENCOUNTER — Other Ambulatory Visit: Payer: Self-pay | Admitting: *Deleted

## 2017-05-14 DIAGNOSIS — Z170 Estrogen receptor positive status [ER+]: Secondary | ICD-10-CM

## 2017-05-14 DIAGNOSIS — C50411 Malignant neoplasm of upper-outer quadrant of right female breast: Secondary | ICD-10-CM

## 2017-05-14 NOTE — Telephone Encounter (Signed)
Received oncotype results of 3.  Left message for patient.  Referral placed for Dr. [name].

## 2017-05-15 ENCOUNTER — Ambulatory Visit (HOSPITAL_BASED_OUTPATIENT_CLINIC_OR_DEPARTMENT_OTHER)
Admission: RE | Admit: 2017-05-15 | Discharge: 2017-05-15 | Disposition: A | Discharge status: Home / Self Care | Payer: BLUE CROSS/BLUE SHIELD | Source: Ambulatory Visit | Attending: General Surgery | Admitting: General Surgery

## 2017-05-15 ENCOUNTER — Encounter (HOSPITAL_BASED_OUTPATIENT_CLINIC_OR_DEPARTMENT_OTHER): Payer: Self-pay | Admitting: *Deleted

## 2017-05-15 ENCOUNTER — Encounter (HOSPITAL_COMMUNITY): Payer: Self-pay

## 2017-05-15 ENCOUNTER — Encounter (HOSPITAL_BASED_OUTPATIENT_CLINIC_OR_DEPARTMENT_OTHER)
Admission: RE | Disposition: A | Discharge status: Home / Self Care | Payer: Self-pay | Source: Ambulatory Visit | Attending: General Surgery

## 2017-05-15 ENCOUNTER — Encounter: Payer: Self-pay | Admitting: Radiation Oncology

## 2017-05-15 ENCOUNTER — Ambulatory Visit (HOSPITAL_BASED_OUTPATIENT_CLINIC_OR_DEPARTMENT_OTHER): Payer: BLUE CROSS/BLUE SHIELD | Admitting: Anesthesiology

## 2017-05-15 DIAGNOSIS — N62 Hypertrophy of breast: Secondary | ICD-10-CM | POA: Insufficient documentation

## 2017-05-15 DIAGNOSIS — Z170 Estrogen receptor positive status [ER+]: Secondary | ICD-10-CM | POA: Insufficient documentation

## 2017-05-15 DIAGNOSIS — K219 Gastro-esophageal reflux disease without esophagitis: Secondary | ICD-10-CM | POA: Insufficient documentation

## 2017-05-15 DIAGNOSIS — Z885 Allergy status to narcotic agent status: Secondary | ICD-10-CM | POA: Insufficient documentation

## 2017-05-15 DIAGNOSIS — Z87891 Personal history of nicotine dependence: Secondary | ICD-10-CM | POA: Insufficient documentation

## 2017-05-15 DIAGNOSIS — F329 Major depressive disorder, single episode, unspecified: Secondary | ICD-10-CM | POA: Diagnosis not present

## 2017-05-15 DIAGNOSIS — Z803 Family history of malignant neoplasm of breast: Secondary | ICD-10-CM | POA: Insufficient documentation

## 2017-05-15 DIAGNOSIS — K5190 Ulcerative colitis, unspecified, without complications: Secondary | ICD-10-CM | POA: Diagnosis not present

## 2017-05-15 DIAGNOSIS — I10 Essential (primary) hypertension: Secondary | ICD-10-CM | POA: Diagnosis not present

## 2017-05-15 DIAGNOSIS — Z6831 Body mass index (BMI) 31.0-31.9, adult: Secondary | ICD-10-CM | POA: Insufficient documentation

## 2017-05-15 DIAGNOSIS — Z85828 Personal history of other malignant neoplasm of skin: Secondary | ICD-10-CM | POA: Insufficient documentation

## 2017-05-15 DIAGNOSIS — Z8711 Personal history of peptic ulcer disease: Secondary | ICD-10-CM | POA: Insufficient documentation

## 2017-05-15 DIAGNOSIS — E669 Obesity, unspecified: Secondary | ICD-10-CM | POA: Insufficient documentation

## 2017-05-15 DIAGNOSIS — Z888 Allergy status to other drugs, medicaments and biological substances status: Secondary | ICD-10-CM | POA: Diagnosis not present

## 2017-05-15 DIAGNOSIS — Z882 Allergy status to sulfonamides status: Secondary | ICD-10-CM | POA: Insufficient documentation

## 2017-05-15 DIAGNOSIS — E039 Hypothyroidism, unspecified: Secondary | ICD-10-CM | POA: Insufficient documentation

## 2017-05-15 DIAGNOSIS — F419 Anxiety disorder, unspecified: Secondary | ICD-10-CM | POA: Diagnosis not present

## 2017-05-15 DIAGNOSIS — Z79899 Other long term (current) drug therapy: Secondary | ICD-10-CM | POA: Insufficient documentation

## 2017-05-15 DIAGNOSIS — C50411 Malignant neoplasm of upper-outer quadrant of right female breast: Secondary | ICD-10-CM | POA: Insufficient documentation

## 2017-05-15 DIAGNOSIS — J45909 Unspecified asthma, uncomplicated: Secondary | ICD-10-CM | POA: Insufficient documentation

## 2017-05-15 HISTORY — PX: RE-EXCISION OF BREAST LUMPECTOMY: SHX6048

## 2017-05-15 LAB — POCT HEMOGLOBIN-HEMACUE: HEMOGLOBIN: 14.3 g/dL (ref 12.0–15.0)

## 2017-05-15 SURGERY — EXCISION, LESION, BREAST
Anesthesia: General | Site: Breast | Laterality: Right

## 2017-05-15 MED ORDER — KETOROLAC TROMETHAMINE 30 MG/ML IJ SOLN
30.0000 mg | Freq: Once | INTRAMUSCULAR | Status: AC
  Administered 2017-05-15: 30 mg via INTRAVENOUS

## 2017-05-15 MED ORDER — PROMETHAZINE HCL 25 MG/ML IJ SOLN: 6.2500 mg | INTRAMUSCULAR | Status: DC | PRN

## 2017-05-15 MED ORDER — KETOROLAC TROMETHAMINE 30 MG/ML IJ SOLN
INTRAMUSCULAR | Status: AC
  Filled 2017-05-15: qty 1

## 2017-05-15 MED ORDER — PROPOFOL 10 MG/ML IV BOLUS
INTRAVENOUS | Status: AC
  Filled 2017-05-15: qty 20

## 2017-05-15 MED ORDER — ACETAMINOPHEN 500 MG PO TABS
1000.0000 mg | ORAL_TABLET | ORAL | Status: AC
  Administered 2017-05-15: 1000 mg via ORAL

## 2017-05-15 MED ORDER — CHLORHEXIDINE GLUCONATE CLOTH 2 % EX PADS: 6.0000 | MEDICATED_PAD | Freq: Once | CUTANEOUS | Status: DC

## 2017-05-15 MED ORDER — MIDAZOLAM HCL 2 MG/2ML IJ SOLN
INTRAMUSCULAR | Status: AC
Start: 2017-05-15 — End: ?
  Filled 2017-05-15: qty 2

## 2017-05-15 MED ORDER — DEXAMETHASONE SODIUM PHOSPHATE 4 MG/ML IJ SOLN
INTRAMUSCULAR | Status: DC | PRN
  Administered 2017-05-15: 10 mg via INTRAVENOUS

## 2017-05-15 MED ORDER — FENTANYL CITRATE (PF) 100 MCG/2ML IJ SOLN
INTRAMUSCULAR | Status: AC
  Filled 2017-05-15: qty 2

## 2017-05-15 MED ORDER — MIDAZOLAM HCL 5 MG/5ML IJ SOLN
INTRAMUSCULAR | Status: DC | PRN
  Administered 2017-05-15: 2 mg via INTRAVENOUS

## 2017-05-15 MED ORDER — ONDANSETRON HCL 4 MG/2ML IJ SOLN
INTRAMUSCULAR | Status: AC
  Filled 2017-05-15: qty 2

## 2017-05-15 MED ORDER — LACTATED RINGERS IV SOLN
INTRAVENOUS | Status: DC
  Administered 2017-05-15 (×2): via INTRAVENOUS

## 2017-05-15 MED ORDER — DEXAMETHASONE SODIUM PHOSPHATE 10 MG/ML IJ SOLN
INTRAMUSCULAR | Status: AC
  Filled 2017-05-15: qty 1

## 2017-05-15 MED ORDER — LIDOCAINE HCL (CARDIAC) 20 MG/ML IV SOLN
INTRAVENOUS | Status: DC | PRN
  Administered 2017-05-15: 30 mg via INTRAVENOUS

## 2017-05-15 MED ORDER — LIDOCAINE 2% (20 MG/ML) 5 ML SYRINGE
INTRAMUSCULAR | Status: AC
  Filled 2017-05-15: qty 5

## 2017-05-15 MED ORDER — HYDROCODONE-ACETAMINOPHEN 5-325 MG PO TABS: 1.0000 | ORAL_TABLET | Freq: Four times a day (QID) | ORAL | 0 refills | Status: DC | PRN

## 2017-05-15 MED ORDER — ONDANSETRON HCL 4 MG/2ML IJ SOLN
INTRAMUSCULAR | Status: DC | PRN
  Administered 2017-05-15: 4 mg via INTRAVENOUS

## 2017-05-15 MED ORDER — SCOPOLAMINE 1 MG/3DAYS TD PT72
1.0000 | MEDICATED_PATCH | Freq: Once | TRANSDERMAL | Status: DC | PRN
  Administered 2017-05-15: 1.5 mg via TRANSDERMAL

## 2017-05-15 MED ORDER — PROPOFOL 10 MG/ML IV BOLUS
INTRAVENOUS | Status: DC | PRN
  Administered 2017-05-15: 150 mg via INTRAVENOUS

## 2017-05-15 MED ORDER — HYDROCODONE-ACETAMINOPHEN 5-325 MG PO TABS
ORAL_TABLET | ORAL | Status: AC
  Filled 2017-05-15: qty 1

## 2017-05-15 MED ORDER — MIDAZOLAM HCL 2 MG/2ML IJ SOLN
INTRAMUSCULAR | Status: AC
  Filled 2017-05-15: qty 2

## 2017-05-15 MED ORDER — CIPROFLOXACIN IN D5W 400 MG/200ML IV SOLN
400.0000 mg | INTRAVENOUS | Status: AC
  Administered 2017-05-15 (×2): 400 mg via INTRAVENOUS

## 2017-05-15 MED ORDER — CIPROFLOXACIN IN D5W 400 MG/200ML IV SOLN
INTRAVENOUS | Status: AC
  Filled 2017-05-15: qty 200

## 2017-05-15 MED ORDER — GABAPENTIN 300 MG PO CAPS
300.0000 mg | ORAL_CAPSULE | ORAL | Status: AC
  Administered 2017-05-15: 300 mg via ORAL

## 2017-05-15 MED ORDER — ACETAMINOPHEN 500 MG PO TABS
ORAL_TABLET | ORAL | Status: AC
  Filled 2017-05-15: qty 2

## 2017-05-15 MED ORDER — SCOPOLAMINE 1 MG/3DAYS TD PT72
MEDICATED_PATCH | TRANSDERMAL | Status: AC
  Filled 2017-05-15: qty 1

## 2017-05-15 MED ORDER — HYDROCODONE-ACETAMINOPHEN 5-325 MG PO TABS
1.0000 | ORAL_TABLET | Freq: Once | ORAL | Status: AC | PRN
  Administered 2017-05-15: 1 via ORAL

## 2017-05-15 MED ORDER — FENTANYL CITRATE (PF) 100 MCG/2ML IJ SOLN: 50.0000 ug | INTRAMUSCULAR | Status: DC | PRN

## 2017-05-15 MED ORDER — LIDOCAINE HCL 1 % IJ SOLN
INTRAMUSCULAR | Status: DC | PRN
  Administered 2017-05-15: 30 mL

## 2017-05-15 MED ORDER — PHENYLEPHRINE HCL 10 MG/ML IJ SOLN
INTRAMUSCULAR | Status: DC | PRN
  Administered 2017-05-15 (×2): 80 ug via INTRAVENOUS

## 2017-05-15 MED ORDER — GABAPENTIN 300 MG PO CAPS
ORAL_CAPSULE | ORAL | Status: AC
  Filled 2017-05-15: qty 1

## 2017-05-15 MED ORDER — FENTANYL CITRATE (PF) 100 MCG/2ML IJ SOLN
25.0000 ug | INTRAMUSCULAR | Status: DC | PRN
  Administered 2017-05-15: 50 ug via INTRAVENOUS
  Administered 2017-05-15 (×2): 25 ug via INTRAVENOUS

## 2017-05-15 MED ORDER — MIDAZOLAM HCL 2 MG/2ML IJ SOLN: 1.0000 mg | INTRAMUSCULAR | Status: DC | PRN

## 2017-05-15 MED ORDER — FENTANYL CITRATE (PF) 100 MCG/2ML IJ SOLN
INTRAMUSCULAR | Status: DC | PRN
  Administered 2017-05-15: 25 ug via INTRAVENOUS
  Administered 2017-05-15: 100 ug via INTRAVENOUS
  Administered 2017-05-15 (×3): 25 ug via INTRAVENOUS

## 2017-05-15 SURGICAL SUPPLY — 55 items
ADH SKN CLS APL DERMABOND .7 (GAUZE/BANDAGES/DRESSINGS) ×1
BINDER BREAST 3XL (GAUZE/BANDAGES/DRESSINGS) IMPLANT
BINDER BREAST LRG (GAUZE/BANDAGES/DRESSINGS) IMPLANT
BINDER BREAST MEDIUM (GAUZE/BANDAGES/DRESSINGS) IMPLANT
BINDER BREAST XLRG (GAUZE/BANDAGES/DRESSINGS) IMPLANT
BINDER BREAST XXLRG (GAUZE/BANDAGES/DRESSINGS) ×1 IMPLANT
BLADE SURG 10 STRL SS (BLADE) ×2 IMPLANT
BLADE SURG 15 STRL LF DISP TIS (BLADE) IMPLANT
BLADE SURG 15 STRL SS (BLADE) ×2
CANISTER SUCT 1200ML W/VALVE (MISCELLANEOUS) ×2 IMPLANT
CHLORAPREP W/TINT 26ML (MISCELLANEOUS) ×2 IMPLANT
CLIP VESOCCLUDE LG 6/CT (CLIP) ×2 IMPLANT
COVER BACK TABLE 60X90IN (DRAPES) ×2 IMPLANT
COVER MAYO STAND STRL (DRAPES) ×2 IMPLANT
DECANTER SPIKE VIAL GLASS SM (MISCELLANEOUS) IMPLANT
DERMABOND ADVANCED (GAUZE/BANDAGES/DRESSINGS) ×1
DERMABOND ADVANCED .7 DNX12 (GAUZE/BANDAGES/DRESSINGS) ×1 IMPLANT
DRAPE LAPAROSCOPIC ABDOMINAL (DRAPES) ×2 IMPLANT
DRAPE UTILITY XL STRL (DRAPES) ×2 IMPLANT
ELECT COATED BLADE 2.86 ST (ELECTRODE) ×2 IMPLANT
ELECT REM PT RETURN 9FT ADLT (ELECTROSURGICAL) ×2
ELECTRODE REM PT RTRN 9FT ADLT (ELECTROSURGICAL) ×1 IMPLANT
GAUZE SPONGE 4X4 12PLY STRL (GAUZE/BANDAGES/DRESSINGS) IMPLANT
GAUZE SPONGE 4X4 12PLY STRL LF (GAUZE/BANDAGES/DRESSINGS) ×1 IMPLANT
GLOVE BIO SURGEON STRL SZ 6 (GLOVE) ×2 IMPLANT
GLOVE BIOGEL PI IND STRL 6.5 (GLOVE) ×1 IMPLANT
GLOVE BIOGEL PI IND STRL 7.0 (GLOVE) IMPLANT
GLOVE BIOGEL PI INDICATOR 6.5 (GLOVE) ×1
GLOVE BIOGEL PI INDICATOR 7.0 (GLOVE) ×2
GLOVE SURG SS PI 6.5 STRL IVOR (GLOVE) ×1 IMPLANT
GOWN STRL REUS W/ TWL LRG LVL3 (GOWN DISPOSABLE) ×1 IMPLANT
GOWN STRL REUS W/TWL 2XL LVL3 (GOWN DISPOSABLE) ×2 IMPLANT
GOWN STRL REUS W/TWL LRG LVL3 (GOWN DISPOSABLE) ×2
KIT MARKER MARGIN INK (KITS) ×2 IMPLANT
NDL HYPO 25X1 1.5 SAFETY (NEEDLE) ×1 IMPLANT
NEEDLE HYPO 25X1 1.5 SAFETY (NEEDLE) ×2 IMPLANT
NS IRRIG 1000ML POUR BTL (IV SOLUTION) ×1 IMPLANT
PACK BASIN DAY SURGERY FS (CUSTOM PROCEDURE TRAY) ×2 IMPLANT
PENCIL BUTTON HOLSTER BLD 10FT (ELECTRODE) ×2 IMPLANT
SLEEVE SCD COMPRESS KNEE MED (MISCELLANEOUS) ×2 IMPLANT
SPONGE LAP 18X18 X RAY DECT (DISPOSABLE) ×2 IMPLANT
STAPLER VISISTAT 35W (STAPLE) IMPLANT
STRIP CLOSURE SKIN 1/2X4 (GAUZE/BANDAGES/DRESSINGS) ×2 IMPLANT
SUT MON AB 4-0 PC3 18 (SUTURE) ×2 IMPLANT
SUT SILK 2 0 SH (SUTURE) IMPLANT
SUT VIC AB 3-0 54X BRD REEL (SUTURE) IMPLANT
SUT VIC AB 3-0 BRD 54 (SUTURE)
SUT VIC AB 3-0 SH 27 (SUTURE) ×2
SUT VIC AB 3-0 SH 27X BRD (SUTURE) ×1 IMPLANT
SYR BULB 3OZ (MISCELLANEOUS) ×2 IMPLANT
SYR CONTROL 10ML LL (SYRINGE) ×2 IMPLANT
TOWEL OR 17X24 6PK STRL BLUE (TOWEL DISPOSABLE) ×2 IMPLANT
TOWEL OR NON WOVEN STRL DISP B (DISPOSABLE) ×2 IMPLANT
TUBE CONNECTING 20X1/4 (TUBING) ×2 IMPLANT
YANKAUER SUCT BULB TIP NO VENT (SUCTIONS) ×2 IMPLANT

## 2017-05-15 NOTE — H&P (Signed)
Erin Fields Documented: 05/10/2017 9:41 AM Location: [facility] Patient #: [id number] DOB: 01-30-1966 Widowed / Language: [name] / Race: White Female   History of Present Illness [name] MD; 05/10/2017 5:59 PM) The patient is a 52 year old female who presents for a follow-up for Breast cancer. Patient is a 52 year old female diagnosed with right breast cancer 02/2017. The patient had a normal mammogram in April of 2018. She palpated a small mass in the outer right breast and brought it to medical attention. Diagnostic ultrasound and mammogram were performed showing a 9 mm mass at 930 o'clock. A core needle biopsy was performed which showed invasive lobular cancer, grade 2, ER and PR positive, HER-2 negative. Ki-67 was 3%. She has a history of ulcerative colitis. She is not have any history of breast problems. Her mother had breast cancer in her 19s. She has a personal history of squamous cell carcinoma of the skin. She has had 2 children with the first child at age 71. She had menarche at age 67 and is no longer having periods. She did use hormonal contraception for 30 years and has been taking hormone replacement until she received her breast cancer diagnosis.   She underwent MRI 12/19 which showed tumor size closer to 2 cm, but no additional disease.  She had right seed localized lumpectomy with sentinel lymph node biopsy 04/24/2017. She called with rash and hives with oxycodone. She had some pain at her post op appt and we prescribed tramadol. She was seen last week by the PA for pain and swelling. She was given script for oxycodone as she did not have a visible seroma to drain. she is doign better, but still sore. She denies fever/chills, but does have some skin warmth.    Surgical pathology 04/24/16 Diagnosis 1. Breast, lumpectomy, Right w/seed - INVASIVE LOBULAR CARCINOMA, GRADE 2, SPANNING 2.2 CM. - LOBULAR NEOPLASIA (ATYPICAL LOBULAR HYPERPLASIA). -  ANTERIOR SUPERIOR MARGIN IS BROADLY POSITIVE. - BIOPSY SITE. - SEE ONCOLOGY TABLE. 2. Breast, excision, Right additional Medial Margin - INVASIVE LOBULAR CARCINOMA. - LOBULAR CARCINOMA IN SITU. - INVASIVE CARCINOMA IS 0.3 CM FROM NEW MEDIAL MARGIN. 3. Lymph node, sentinel, biopsy, Right Axillary #1 - ISOLATED TUMOR CELLS IN ONE OF ONE LYMPH NODES (0/1). 4. Lymph node, sentinel, biopsy, Right Axillary #2 - ONE OF ONE LYMPH NODES NEGATIVE FOR CARCINOMA (0/1). 5. Lymph node, sentinel, biopsy, Right Axillary #3 - ONE OF ONE LYMPH NODES NEGATIVE FOR CARCINOMA (0/1).   Allergies (Tanisha A. Owens Shark, [place]; 05/10/2017 9:43 AM) OxyCODONE HCl *ANALGESICS - OPIOID*  Hives. Cefaclor *CEPHALOSPORINS*  Lisinopril *ANTIHYPERTENSIVES*  Triple Sulfonamides *SULFONAMIDES*  ACE Inhibitors  Allergies Reconciled   Medication History (Tanisha A. Owens Shark, [place]; 05/10/2017 9:44 AM) Doxycycline Hyclate (100MG Tablet, 1 (one) Oral two times daily, Taken starting 05/04/2017) Active. OxyCODONE HCl (5MG Tablet, Oral) Active. DiazePAM (2MG Tablet, Oral) Active. Zolpidem Tartrate (5MG Tablet, Oral) Active. Acyclovir (400MG Tablet, Oral) Active. Azelastine HCl (0.1% Solution, Nasal) Active. Hyoscyamine Sulfate (0.125MG Tablet Disint, Oral) Active. Levalbuterol Tartrate (45MCG/ACT Aerosol, Inhalation) Active. Levothyroxine Sodium (88MCG Tablet, Oral) Active. Lopreeza (0.5-0.1MG Tablet, Oral) Active. Meloxicam (15MG Tablet, Oral) Active. Montelukast Sodium (10MG Tablet, Oral) Active. Mesalamine (1.2GM Tablet DR, Oral) Active. Promethazine HCl (25MG Tablet, Oral) Active. Rosuvastatin Calcium (10MG Tablet, Oral) Active. Symbicort (160-4.5MCG/ACT Aerosol, Inhalation) Active. Venlafaxine HCl ER (75MG Capsule ER 24HR, Oral) Active. Lovastatin (40MG Tablet, Oral) Active. Pennsaid (2% Solution, Transdermal) Active. Olmesartan Medoxomil (40MG Tablet, Oral) Active. Medications  Reconciled    Review  of Systems [name] MD; 05/10/2017 5:59 PM) All other systems negative  Vitals (Tanisha A. Brown RMA; 05/10/2017 9:42 AM) 05/10/2017 9:42 AM Weight: 180.4 lb Height: 63in Body Surface Area: 1.85 m Body Mass Index: 31.96 kg/m  Temp.: 98.60F  Pulse: 84 (Regular)  BP: 140/82 (Sitting, Left Arm, Standard)       Physical Exam [name] MD; 05/10/2017 6:00 PM) Breast Note: no bruising. small to moderate seroma on right. no erythema, but skin a little warm. no appreciable axillary seroma.     Assessment & Plan [name] MD; 05/10/2017 6:01 PM) PRIMARY CANCER OF UPPER OUTER QUADRANT OF RIGHT FEMALE BREAST (C50.411) Impression: Reexcision set up next week. Will drain seroma at that time. Since breast is warm, will continue doxy until tuesday. Current Plans Continued Doxycycline Hyclate 100 MG Oral Tablet, 1 (one) Tablet two times daily, #14, 05/10/2017, No Refill. Instructed to keep follow-up appointment as scheduled   Signed by [name], MD (05/10/2017 6:01 PM)

## 2017-05-15 NOTE — Anesthesia Preprocedure Evaluation (Addendum)
Anesthesia Evaluation  Patient identified by MRN, date of birth, ID band Patient awake    Reviewed: Allergy & Precautions, NPO status , Patient's Chart, lab work & pertinent test results  History of Anesthesia Complications (+) PONV  Airway Mallampati: II  TM Distance: >3 FB Neck ROM: Full    Dental no notable dental hx. (+) Dental Advisory Given   Pulmonary asthma , former smoker,    Pulmonary exam normal breath sounds clear to auscultation       Cardiovascular hypertension, Pt. on medications Normal cardiovascular exam Rhythm:Regular Rate:Normal     Neuro/Psych  Headaches, Anxiety Depression    GI/Hepatic Neg liver ROS, PUD, GERD  Poorly Controlled and Medicated,Crohn's/UC   Endo/Other  Hypothyroidism Obesity  Renal/GU negative Renal ROS  negative genitourinary   Musculoskeletal negative musculoskeletal ROS (+)   Abdominal   Peds negative pediatric ROS (+)  Hematology negative hematology ROS (+)   Anesthesia Other Findings Right breast cancer  Reproductive/Obstetrics negative OB ROS                             Anesthesia Physical  Anesthesia Plan  ASA: III  Anesthesia Plan: General   Post-op Pain Management:  Regional for Post-op pain   Induction: Intravenous  PONV Risk Score and Plan: 4 or greater and Scopolamine patch - Pre-op, Midazolam, Dexamethasone, Ondansetron and Treatment may vary due to age or medical condition  Airway Management Planned: Oral ETT  Additional Equipment: None  Intra-op Plan:   Post-operative Plan: Extubation in OR  Informed Consent: I have reviewed the patients History and Physical, chart, labs and discussed the procedure including the risks, benefits and alternatives for the proposed anesthesia with the patient or authorized representative who has indicated his/her understanding and acceptance.   Dental advisory given  Plan Discussed with:  CRNA  Anesthesia Plan Comments:         Anesthesia Quick Evaluation

## 2017-05-15 NOTE — Discharge Instructions (Addendum)
[place] Office Phone Number [phone]  BREAST BIOPSY/ PARTIAL MASTECTOMY: POST OP INSTRUCTIONS  Always review your discharge instruction sheet given to you by the facility where your surgery was performed.  IF YOU HAVE DISABILITY OR FAMILY LEAVE FORMS, YOU MUST BRING THEM TO THE OFFICE FOR PROCESSING.  DO NOT GIVE THEM TO YOUR DOCTOR.  1. A prescription for pain medication may be given to you upon discharge.  Take your pain medication as prescribed, if needed.  If narcotic pain medicine is not needed, then you may take acetaminophen (Tylenol) or ibuprofen (Advil) as needed. 2. Take your usually prescribed medications unless otherwise directed 3. If you need a refill on your pain medication, please contact your pharmacy.  They will contact our office to request authorization.  Prescriptions will not be filled after 5pm or on week-ends. 4. You should eat very light the first 24 hours after surgery, such as soup, crackers, pudding, etc.  Resume your normal diet the day after surgery. 5. Most patients will experience some swelling and bruising in the breast.  Ice packs and a good support bra will help.  Swelling and bruising can take several days to resolve.  6. It is common to experience some constipation if taking pain medication after surgery.  Increasing fluid intake and taking a stool softener will usually help or prevent this problem from occurring.  A mild laxative (Milk of Magnesia or Miralax) should be taken according to package directions if there are no bowel movements after 48 hours. 7. Unless discharge instructions indicate otherwise, you may remove your bandages 48 hours after surgery, and you may shower at that time.  You may have steri-strips (small skin tapes) in place directly over the incision.  These strips should be left on the skin for 7-10 days.   Any sutures or staples will be removed at the office during your follow-up visit. 8. ACTIVITIES:  You may resume  regular daily activities (gradually increasing) beginning the next day.  Wearing a good support bra or sports bra (or the breast binder) minimizes pain and swelling.  You may have sexual intercourse when it is comfortable. a. You may drive when you no longer are taking prescription pain medication, you can comfortably wear a seatbelt, and you can safely maneuver your car and apply brakes. b. RETURN TO WORK:  __________1 week_______________ 9. You should see your doctor in the office for a follow-up appointment approximately two weeks after your surgery.  Your doctors nurse will typically make your follow-up appointment when she calls you with your pathology report.  Expect your pathology report 2-3 business days after your surgery.  You may call to check if you do not hear from [place] after three days.   WHEN TO CALL YOUR DOCTOR: 1. Fever over 101.0 2. Nausea and/or vomiting. 3. Extreme swelling or bruising. 4. Continued bleeding from incision. 5. Increased pain, redness, or drainage from the incision.  The clinic staff is available to answer your questions during regular business hours.  Please dont hesitate to call and ask to speak to one of the nurses for clinical concerns.  If you have a medical emergency, go to the nearest emergency room or call 911.  A surgeon from [facility] is always on call at the hospital.  For further questions, please visit [email or web address]    Post Anesthesia Home Care Instructions  Activity: Get plenty of rest for the remainder of the day. A responsible individual must stay with you for 24  hours following the procedure.  For the next 24 hours, DO NOT: -Drive a car -Paediatric nurse -Drink alcoholic beverages -Take any medication unless instructed by your physician -Make any legal decisions or sign important papers.  Meals: Start with liquid foods such as gelatin or soup. Progress to regular foods as tolerated. Avoid greasy, spicy,  heavy foods. If nausea and/or vomiting occur, drink only clear liquids until the nausea and/or vomiting subsides. Call your physician if vomiting continues.  Special Instructions/Symptoms: Your throat may feel dry or sore from the anesthesia or the breathing tube placed in your throat during surgery. If this causes discomfort, gargle with warm salt water. The discomfort should disappear within 24 hours.  If you had a scopolamine patch placed behind your ear for the management of post- operative nausea and/or vomiting:  1. The medication in the patch is effective for 72 hours, after which it should be removed.  Wrap patch in a tissue and discard in the trash. Wash hands thoroughly with soap and water. 2. You may remove the patch earlier than 72 hours if you experience unpleasant side effects which may include dry mouth, dizziness or visual disturbances. 3. Avoid touching the patch. Wash your hands with soap and water after contact with the patch.

## 2017-05-15 NOTE — Interval H&P Note (Signed)
History and Physical Interval Note:  05/15/2017 10:34 AM  Erin Fields  has presented today for surgery, with the diagnosis of RIGHT BREAST CANCER  The various methods of treatment have been discussed with the patient and family. After consideration of risks, benefits and other options for treatment, the patient has consented to  Procedure(s): RE-EXCISION OF BREAST LUMPECTOMY (Right) as a surgical intervention .  The patient's history has been reviewed, patient examined, no change in status, stable for surgery.  I have reviewed the patient's chart and labs.  Questions were answered to the patient's satisfaction.     [name]

## 2017-05-15 NOTE — Transfer of Care (Signed)
Immediate Anesthesia Transfer of Care Note  Patient: Erin Fields  Procedure(s) Performed: RE-EXCISION OF BREAST LUMPECTOMY (Right Breast)  Patient Location: PACU  Anesthesia Type:General  Level of Consciousness: awake and patient cooperative  Airway & Oxygen Therapy: Patient Spontanous Breathing and Patient connected to face mask oxygen  Post-op Assessment: Report given to RN and Post -op Vital signs reviewed and stable  Post vital signs: Reviewed and stable  Last Vitals:  Vitals:   05/15/17 0913  BP: 121/76  Pulse: (!) 110  Resp: 18  Temp: 36.9 C  SpO2: 96%    Last Pain:  Vitals:   05/15/17 0925  TempSrc:   PainSc: 5          Complications: No apparent anesthesia complications

## 2017-05-15 NOTE — Anesthesia Postprocedure Evaluation (Signed)
Anesthesia Post Note  Patient: Erin Fields  Procedure(s) Performed: RE-EXCISION OF BREAST LUMPECTOMY (Right Breast)     Patient location during evaluation: PACU Anesthesia Type: General Level of consciousness: sedated Pain management: pain level controlled Vital Signs Assessment: post-procedure vital signs reviewed and stable Respiratory status: spontaneous breathing and respiratory function stable Cardiovascular status: stable Postop Assessment: no apparent nausea or vomiting Anesthetic complications: no    Last Vitals:  Vitals:   05/15/17 1251 05/15/17 1255  BP:  133/77  Pulse:  (!) 103  Resp:  20  Temp:  37.1 C  SpO2: 94% 94%    Last Pain:  Vitals:   05/15/17 1255  TempSrc: Oral  PainSc:                  Adalida Garver DANIEL

## 2017-05-15 NOTE — Op Note (Signed)
Re-excisional Right Breast Lumpectomy   Indications: This patient presents with history of Positive margins after partial mastectomy for right breast cancer   Pre-operative Diagnosis: Right breast cancer   Post-operative Diagnosis: Right breast cancer   Surgeon: [id number],[email or web address]   Assistants: n/a   Anesthesia: General anesthesia and Local anesthesia   ASA Class: 3   Procedure Details  The patient was seen in the Holding Room. The risks, benefits, complications, treatment options, and expected outcomes were discussed with the patient. The possibilities of reaction to medication, pulmonary aspiration, bleeding, infection, the need for additional procedures, failure to diagnose a condition, and creating a complication requiring transfusion or operation were discussed with the patient. The patient concurred with the proposed plan, giving informed consent. The site of surgery properly noted/marked. The patient was taken to Operating Room # 8, identified, and the procedure verified as re-excision of right breast cancer.  After induction of anesthesia, the right breast and chest were prepped and draped in standard fashion. The lumpectomy was performed by reopening the prior incision. Seroma was aspirated.  Additional margin was taken at the anterior border of the partial mastectomy cavity.  The specimen was painted for orientation. Hemostasis was achieved with cautery. The wound was irrigated and closed with a 3-0 Vicryl deep dermal interrupted and a 4-0 Monocryl subcuticular closure in layers.  Sterile dressings were applied. At the end of the operation, all sponge, instrument, and needle counts were correct.   Findings:  grossly clear surgical margins, no residual disease.  Anterior margin is now skin.  Estimated Blood Loss: Minimal   Drains: none   Specimens: additional anterior margin  Complications: None; patient tolerated the procedure well.   Disposition: PACU - hemodynamically stable.    Condition: stable

## 2017-05-15 NOTE — Anesthesia Procedure Notes (Signed)
Procedure Name: LMA Insertion Date/Time: 05/15/2017 10:48 AM Performed by: Marrianne Mood, CRNA Pre-anesthesia Checklist: Patient identified, Emergency Drugs available, Suction available, Patient being monitored and Timeout performed Patient Re-evaluated:Patient Re-evaluated prior to induction Oxygen Delivery Method: Circle system utilized Preoxygenation: Pre-oxygenation with 100% oxygen Induction Type: IV induction Ventilation: Mask ventilation without difficulty LMA: LMA inserted LMA Size: 4.0 Number of attempts: 1 Airway Equipment and Method: Bite block Placement Confirmation: positive ETCO2 Tube secured with: Tape Dental Injury: Teeth and Oropharynx as per pre-operative assessment

## 2017-05-16 ENCOUNTER — Encounter (HOSPITAL_BASED_OUTPATIENT_CLINIC_OR_DEPARTMENT_OTHER): Payer: Self-pay | Admitting: General Surgery

## 2017-05-16 ENCOUNTER — Encounter: Payer: Self-pay | Admitting: Family Medicine

## 2017-05-16 DIAGNOSIS — F5104 Psychophysiologic insomnia: Secondary | ICD-10-CM

## 2017-05-16 MED ORDER — ZOLPIDEM TARTRATE 5 MG PO TABS: ORAL_TABLET | ORAL | 1 refills | Status: DC

## 2017-05-16 NOTE — Telephone Encounter (Signed)
Called her drug store to check on her Erin Fields status as I cannot get into [place]- they did not have a fill since August

## 2017-05-17 NOTE — Progress Notes (Signed)
Please let patietn know margin is negative!

## 2017-05-27 NOTE — Progress Notes (Addendum)
[facility] [place] [facility] [address], [place], [place] [address] [phone]  Date:  05/28/2017   Name:  Erin Fields   DOB:  1965-12-15   MRN:  [id number]  PCP:  Darreld Mclean, MD    Chief Complaint: No chief complaint on file.   History of Present Illness:  Erin Fields is a 52 y.o. very pleasant female patient who presents with the following:  Here today for follow-up and lab Recently diagnosed with breast cancer-she had palpated a mass in October and presented to her GYN who did further work-up  She had a lumpectomy on 1/29- her surgeon is Dr. [name] She is being seen later on today by her for a post- op check She is having genetic testing and will have radiation, ?femara or similar for severla months  She has not been to work in about 5 weeks now  Her mood is pretty ok- she is taking her valium daily She is able to sleep pretty well except when she is on hydrocodone for pain- this disturbs her sleep  She is on effexor xr 75- might like to increase to 150 for the time being She takes her effexor in the am, her valium and mid day and her ambien at night   Her husband died in an accident in 07-09-2016, and her daughter died in 10-Jul-2015 due to accidental OD Her son is at [employer]- he is doing ok but is afraid that she will die too having just lost his father   She is taking 88 mcg of thyroid daily  Lab Results  Component Value Date   HGBA1C 6.4 12/20/2016   Lab Results  Component Value Date   TSH 1.23 12/20/2016    Patient Active Problem List   Diagnosis Date Noted  . Malignant neoplasm of upper-outer quadrant of right breast in female, estrogen receptor positive ([place]) 03/20/2017  . Crohn disease ([place]) 11/29/2016  . Borderline diabetes 12/17/2015  . Depression 12/17/2015  . Ex-smoker 11/18/2015  . Chronic venous insufficiency 09/02/2015  . Symptomatic varicose veins, right 09/02/2015  . Eustachian tube  dysfunction 12/04/2014  . Allergic rhinitis 09/15/2014  . Dyspnea 09/15/2014  . Liver hemangioma 08/24/2014  . Elevated triglycerides with high cholesterol 11/26/2013  . Insomnia 08/20/2013  . Hypertension 08/20/2013  . Ulcerative colitis, unspecified 04/20/2013  . Oral herpes 04/20/2013  . Hyperlipidemia 12/06/2006  . HEADACHE 12/06/2006  . CERVICAL CANCER, HX OF 12/06/2006  . Hypothyroidism 11/29/2006  . Asthma 11/29/2006  . IRRITABLE BOWEL SYNDROME 11/29/2006    Past Medical History:  Diagnosis Date  . Allergy   . Anxiety   . Asthma   . Cancer [facility])    right breast cancer  . Chicken pox   . Colon polyps   . Depression   . Elevated blood pressure   . Environmental allergies   . Frequent headaches   . GERD (gastroesophageal reflux disease)   . Hay fever   . Hyperlipidemia   . Hypertension   . Hypothyroidism   . Migraines   . PONV (postoperative nausea and vomiting)   . Thyroid disease   . Ulcerative colitis ([place])   . Varicose veins     Past Surgical History:  Procedure Laterality Date  . ABLATION  07-09-2009  . BREAST LUMPECTOMY WITH RADIOACTIVE SEED AND SENTINEL LYMPH NODE BIOPSY Right 04/24/2017   Procedure: RIGHT BREAST LUMPECTOMY WITH RADIOACTIVE SEED AND SENTINEL LYMPH  NODE BIOPSY;  Surgeon: [name], MD;  Location: [place];  Service: General;  Laterality: Right;  . FOOT SURGERY  2012  . HAND SURGERY    . RE-EXCISION OF BREAST LUMPECTOMY Right 05/15/2017   Procedure: RE-EXCISION OF BREAST LUMPECTOMY;  Surgeon: [name], MD;  Location: [place];  Service: General;  Laterality: Right;  . Septoplasty with turbinate reduction    . TONSILLECTOMY  1980  . VEIN SURGERY     Removal Right Leg  . WISDOM TOOTH EXTRACTION      Social History   Tobacco Use  . Smoking status: Former Smoker    Packs/day: 4.00    Years: 15.00    Pack years: 60.00    Types: Cigarettes    Last attempt to quit: 09/14/1993    Years since  quitting: 23.7  . Smokeless tobacco: Never Used  . Tobacco comment: Quit >20 yrs ago  Substance Use Topics  . Alcohol use: Yes    Comment: Drinks a couple every 2-3 months  . Drug use: No    Family History  Problem Relation Age of Onset  . Arthritis Mother 80       Deceased  . Breast cancer Mother   . Lung cancer Mother   . Hyperlipidemia Mother   . Heart disease Mother   . Hypertension Mother   . Diabetes Mother   . Crohn's disease Mother   . Diabetes Maternal Grandfather   . Heart disease Maternal Grandfather   . Heart attack Maternal Grandfather   . Colitis Sister     Allergies  Allergen Reactions  . Ace Inhibitors Other (See Comments)    Angina  . Aspirin   . Cefaclor   . Lisinopril Cough  . Losartan Potassium   . Oxycodone Hives  . Sulfonamide Derivatives     Medication list has been reviewed and updated.  Current Outpatient Medications on File Prior to Visit  Medication Sig Dispense Refill  . acyclovir (ZOVIRAX) 400 MG tablet Take 1 tablet (400 mg total) by mouth 3 (three) times daily as needed. 30 tablet 5  . azelastine (ASTELIN) 0.1 % nasal spray 2 sprays 2 (two) times daily.  6  . budesonide-formoterol (SYMBICORT) 160-4.5 MCG/ACT inhaler Inhale 2 puffs into the lungs 2 (two) times daily.    . diazepam (VALIUM) 2 MG tablet Take 1 tablet (2 mg total) every 6 (six) hours as needed by mouth for anxiety. Do not combine with ambien 30 tablet 1  . lansoprazole (PREVACID) 30 MG capsule Take 30 mg by mouth daily at 12 noon.    . levalbuterol (XOPENEX HFA) 45 MCG/ACT inhaler Inhale 2 puffs into the lungs every 4 (four) hours as needed for wheezing (Q4-6 Hours PRN).    Marland Kitchen levothyroxine (SYNTHROID, LEVOTHROID) 88 MCG tablet Take 1 tablet (88 mcg total) by mouth daily. (Patient taking differently: Take 530 mcg by mouth daily before breakfast. ) 90 tablet 3  . mesalamine (LIALDA) 1.2 G EC tablet Take 2.4 g by mouth daily.    . montelukast (SINGULAIR) 10 MG tablet Take 10  mg by mouth at bedtime.     Marland Kitchen olmesartan (BENICAR) 40 MG tablet Take 1 tablet daily 90 tablet 3  . promethazine (PHENERGAN) 25 MG tablet TAKE 1/2-1 TABLET DAILY AS NEEDED FOR NAUSEA 30 tablet 0  . rosuvastatin (CRESTOR) 10 MG tablet Take 1 tablet (10 mg total) by mouth daily. 30 tablet 11  . triamcinolone (NASACORT ALLERGY 24HR) 55 MCG/ACT AERO  nasal inhaler Place 1 spray into the nose daily.    Marland Kitchen venlafaxine XR (EFFEXOR-XR) 75 MG 24 hr capsule TAKE 1 CAPSULE BY MOUTH EVERY DAY WITH BREAKFAST 90 capsule 3  . zolpidem (AMBIEN) 5 MG tablet TAKE 1 TABLET BY MOUTH EVERY DAY AT BEDTIME AS NEEDED. Do not combine with any other sedating medication 30 tablet 1   No current facility-administered medications on file prior to visit.     Review of Systems:  As per HPI- otherwise negative.   Physical Examination: Vitals:   05/28/17 1022  BP: 126/72  Pulse: 86  Temp: 98 F (36.7 C)  SpO2: 98%   Vitals:   05/28/17 1022  Weight: 178 lb 3.2 oz (80.8 kg)  Height: 5' 3"  (1.6 m)   Body mass index is 31.57 kg/m. Ideal Body Weight: Weight in (lb) to have BMI = 25: 140.8  GEN: WDWN, NAD, Non-toxic, A & O x 3 HEENT: Atraumatic, Normocephalic. Neck supple. No masses, No LAD. Ears and Nose: No external deformity. CV: RRR, No M/G/R. No JVD. No thrill. No extra heart sounds. Marland KitchenPULM: CTA B, no wheezes, crackles, rhonchi. No retractions. No resp. distress. No accessory muscle use. EXTR: No c/c/e NEURO Normal gait.  PSYCH: Normally interactive. Conversant. Not depressed or anxious appearing.  Calm demeanor.  Obese, otherwise looks well today  Assessment and Plan: Acquired hypothyroidism - Plan: TSH  Pre-diabetes - Plan: Hemoglobin A1c  Essential hypertension  Acute stress reaction - Plan: venlafaxine XR (EFFEXOR-XR) 75 MG 24 hr capsule  Anxiety associated with cancer diagnosis  Hypothyroidism due to acquired atrophy of thyroid - Plan: levothyroxine (SYNTHROID, LEVOTHROID) 88 MCG tablet  Here  today for a follow-up visit Recent dx of breast cancer Would like to check TSH and A1c today Increased her effexor to 150 mg Offered support and encouragement.  She has really been through a lot   Signed Lamar Blinks, MD  Received her labs  Results for orders placed or performed in visit on 05/28/17  Hemoglobin A1c  Result Value Ref Range   Hgb A1c MFr Bld 6.7 (H) 4.6 - 6.5 %  TSH  Result Value Ref Range   TSH 0.62 0.35 - 4.50 uIU/mL   Message to pt  Your thyroid looks fine- continue current dose of thyroid medication Your A1c (average blood sugar over the previous 3 months) is back in the diabetes range.  I'm afraid that this does mean that you have diabetes.  However, at this time you do not have to start on any medication.  Try to manage your weight as best as you can, and let's visit in 4 months to repeat your A1c.  Take care!

## 2017-05-28 ENCOUNTER — Encounter: Payer: Self-pay | Admitting: Family Medicine

## 2017-05-28 ENCOUNTER — Ambulatory Visit: Payer: BLUE CROSS/BLUE SHIELD | Admitting: Family Medicine

## 2017-05-28 VITALS — BP 126/72 | HR 86 | Temp 98.0°F | Ht 63.0 in | Wt 178.2 lb

## 2017-05-28 DIAGNOSIS — I10 Essential (primary) hypertension: Secondary | ICD-10-CM

## 2017-05-28 DIAGNOSIS — F418 Other specified anxiety disorders: Secondary | ICD-10-CM

## 2017-05-28 DIAGNOSIS — F430 Acute stress reaction: Secondary | ICD-10-CM

## 2017-05-28 DIAGNOSIS — E119 Type 2 diabetes mellitus without complications: Secondary | ICD-10-CM | POA: Diagnosis not present

## 2017-05-28 DIAGNOSIS — E039 Hypothyroidism, unspecified: Secondary | ICD-10-CM | POA: Diagnosis not present

## 2017-05-28 DIAGNOSIS — E034 Atrophy of thyroid (acquired): Secondary | ICD-10-CM

## 2017-05-28 DIAGNOSIS — C801 Malignant (primary) neoplasm, unspecified: Secondary | ICD-10-CM

## 2017-05-28 DIAGNOSIS — R932 Abnormal findings on diagnostic imaging of liver and biliary tract: Secondary | ICD-10-CM

## 2017-05-28 DIAGNOSIS — F411 Generalized anxiety disorder: Secondary | ICD-10-CM

## 2017-05-28 DIAGNOSIS — R7303 Prediabetes: Secondary | ICD-10-CM | POA: Diagnosis not present

## 2017-05-28 HISTORY — DX: Type 2 diabetes mellitus without complications: E11.9

## 2017-05-28 LAB — TSH: TSH: 0.62 u[IU]/mL (ref 0.35–4.50)

## 2017-05-28 LAB — HEMOGLOBIN A1C: Hgb A1c MFr Bld: 6.7 % — ABNORMAL HIGH (ref 4.6–6.5)

## 2017-05-28 MED ORDER — LEVOTHYROXINE SODIUM 88 MCG PO TABS: 88.0000 ug | ORAL_TABLET | Freq: Every day | ORAL | 3 refills | Status: DC

## 2017-05-28 MED ORDER — VENLAFAXINE HCL ER 75 MG PO CP24: ORAL_CAPSULE | ORAL | 3 refills | Status: DC

## 2017-05-28 NOTE — Patient Instructions (Signed)
It was good to see you today- I am so sorry for the very hard time you have had over the last 2 years.  If I can help, or if you are not doing ok, please alert me.  You can increase your effexor to 2 pills daily if you like for the time being .  I will be in touch with your labs Let's plan to visit in about 6 months unless anything else comes up

## 2017-05-30 NOTE — Progress Notes (Signed)
Location of Breast Cancer:upper-outer quadrant of right breast   Histology per Pathology Report:   05/15/17 Diagnosis Breast, excision, Right additional anterior margin - INVASIVE LOBULAR CARCINOMA. - ATYPICAL LOBULAR HYPERPLASIA. - THE NEW ANTERIOR SURGICAL RESECTION MARGIN IS NEGATIVE FOR CARCINOMA.  04/24/17 Diagnosis 1. Breast, lumpectomy, Right w/seed - INVASIVE LOBULAR CARCINOMA, GRADE 2, SPANNING 2.2 CM. - LOBULAR NEOPLASIA (ATYPICAL LOBULAR HYPERPLASIA). - ANTERIOR SUPERIOR MARGIN IS BROADLY POSITIVE. - BIOPSY SITE. - SEE ONCOLOGY TABLE. 2. Breast, excision, Right additional Medial Margin - INVASIVE LOBULAR CARCINOMA. - LOBULAR CARCINOMA IN SITU. - INVASIVE CARCINOMA IS 0.3 CM FROM NEW MEDIAL MARGIN. 3. Lymph node, sentinel, biopsy, Right Axillary #1 - ISOLATED TUMOR CELLS IN ONE OF ONE LYMPH NODES (0/1). 4. Lymph node, sentinel, biopsy, Right Axillary #2 - ONE OF ONE LYMPH NODES NEGATIVE FOR CARCINOMA (0/1). 5. Lymph node, sentinel, biopsy, Right Axillary #3 - ONE OF ONE LYMPH NODES NEGATIVE FOR CARCINOMA (0/1).  03/16/17 Diagnosis Breast, right, needle core biopsy, 9:30 o'clock, ribbon clip placed - INVASIVE MAMMARY CARCINOMA.  Receptor Status: ER(95%), PR (95%), Her2-neu (negative), Ki-(3%), Oncotype score of 3  Did patient present with symptoms (if so, please note symptoms) or was this found on screening mammography?: patient presented with a palpable lump.  Past/Anticipated interventions by surgeon, if any: 05/15/17 - Procedure: RE-EXCISION OF BREAST LUMPECTOMY;  Surgeon: [name], MD and 04/24/17 - Procedure: RIGHT BREAST LUMPECTOMY WITH RADIOACTIVE SEED AND SENTINEL LYMPH NODE BIOPSY;  Surgeon: [name], MD  Past/Anticipated interventions by medical oncology, if any: adjuvant endocrine therapy with tamoxifen or aromatase inhibitor, such as Letrozole, depends on her menopause status, for a total of 5-10 years to reduce the risk of cancer  recurrence.  Lymphedema issues, if any:  no    Pain issues, if any:  yes has pain at a 4/10 in her right breast.  She said she has not found pain medicine that she can take.  SAFETY ISSUES:  Prior radiation? no  Pacemaker/ICD? no  Possible current pregnancy?no  Is the patient on methotrexate? no  Current Complaints / other details:  Patient is here with her sister.  She said she had fluid drained from her right breast on Monday.  She will see Dr. [name] next Monday.  BP 115/77 (BP Location: Left Arm, Patient Position: Sitting)   Pulse 94   Temp 98.2 F (36.8 C) (Oral)   Ht 5' 3.5" (1.613 m)   Wt 180 lb 12.8 oz (82 kg)   SpO2 96%   BMI 31.52 kg/m    Wt Readings from Last 3 Encounters:  06/04/17 180 lb 12.8 oz (82 kg)  05/28/17 178 lb 3.2 oz (80.8 kg)  05/09/17 180 lb (81.6 kg)      Jacqulyn Liner, RN 05/30/2017,8:24 AM

## 2017-05-30 NOTE — Addendum Note (Signed)
Addended by: Lamar Blinks C on: 05/30/2017 04:42 PM   Modules accepted: Orders

## 2017-06-04 ENCOUNTER — Ambulatory Visit
Admission: RE | Admit: 2017-06-04 | Discharge: 2017-06-04 | Disposition: A | Discharge status: Home / Self Care | Payer: BLUE CROSS/BLUE SHIELD | Source: Ambulatory Visit | Attending: Radiation Oncology | Admitting: Radiation Oncology

## 2017-06-04 ENCOUNTER — Encounter: Payer: Self-pay | Admitting: Radiation Oncology

## 2017-06-04 ENCOUNTER — Other Ambulatory Visit: Payer: Self-pay

## 2017-06-04 DIAGNOSIS — C50411 Malignant neoplasm of upper-outer quadrant of right female breast: Secondary | ICD-10-CM | POA: Diagnosis not present

## 2017-06-04 DIAGNOSIS — Z170 Estrogen receptor positive status [ER+]: Secondary | ICD-10-CM

## 2017-06-04 DIAGNOSIS — C773 Secondary and unspecified malignant neoplasm of axilla and upper limb lymph nodes: Secondary | ICD-10-CM | POA: Insufficient documentation

## 2017-06-04 DIAGNOSIS — Z79899 Other long term (current) drug therapy: Secondary | ICD-10-CM | POA: Insufficient documentation

## 2017-06-04 DIAGNOSIS — G893 Neoplasm related pain (acute) (chronic): Secondary | ICD-10-CM | POA: Diagnosis not present

## 2017-06-04 NOTE — Progress Notes (Signed)
Radiation Oncology         [phone] ________________________________  Name: Erin Fields MRN: [id number]  Date: 06/04/2017  DOB: [birth year]  Re-Evaluation Note  CC: Copland, Gay Filler, MD  [name], MD    ICD-10-CM   1. Malignant neoplasm of upper-outer quadrant of right breast in female, estrogen receptor positive ([place]) C50.411    Z17.0     Diagnosis:   52 y.o. female with malignant neoplasm of upper-outer quadrant of right breast in female, estrogen receptor positive ([place]) Staging form: Breast, AJCC 8th Edition - Clinical stage from 03/16/2017: Stage IA (cT1b, cN0, cM0, G2, ER: Positive, PR: Positive, HER2: Negative)  - Pathologic Stage pT2, pN0 (i+), pMX  Narrative:  The patient returns today for re-evaluation of her breast cancer s/p right breast lumpectomy with sentinel lymph node biopsy on 04/24/2017. Pathology revealed grade 2 invasive lobular carcinoma, spanning 2.2 cm, with a broadly positive anterior superior margin. Additionally, an excision of the medial margin revealed invasive and in situ lobular carcinoma, and biopsies of 3 right axillary lymph nodes revealed isolated tumor cells in one lymph node. The other two lymph nodes were negative. She remains ER/PR positive and Her2 negative. She therefore underwent a re-excision of the anterior margin on 05/15/2017 which revealed invasive lobular carcinoma, but the new anterior resection margin was negative. She reports she had fluid drained from her right breast. She is scheduled to see Dr. [name] next week.   The patient returns today to further discuss the role of radiation therapy in the management of her breast cancer. She is accompanied by her sister. On review of systems, the patient reports 4/10 pain in her right breast and is taking Aleve for the pain. She is not taking any prescription pain medication because she has not found one that she can take. She reports the oxycodone and hydrocodone she took post-operatively were  not well-tolerated and caused a rash. She denies any lymphedema issues.     ALLERGIES:  is allergic to ace inhibitors; aspirin; cefaclor; lisinopril; losartan potassium; oxycodone; and sulfonamide derivatives.  Meds: Current Outpatient Medications  Medication Sig Dispense Refill  . acyclovir (ZOVIRAX) 400 MG tablet Take 1 tablet (400 mg total) by mouth 3 (three) times daily as needed. 30 tablet 5  . azelastine (ASTELIN) 0.1 % nasal spray 2 sprays 2 (two) times daily.  6  . budesonide-formoterol (SYMBICORT) 160-4.5 MCG/ACT inhaler Inhale 2 puffs into the lungs 2 (two) times daily.    . diazepam (VALIUM) 2 MG tablet Take 1 tablet (2 mg total) every 6 (six) hours as needed by mouth for anxiety. Do not combine with ambien 30 tablet 1  . EPINEPHrine 0.3 mg/0.3 mL IJ SOAJ injection epinephrine 0.3 mg/0.3 mL injection, auto-injector  TK UTD PRN    . hyoscyamine (ANASPAZ) 0.125 MG TBDP disintergrating tablet hyoscyamine 0.125 mg disintegrating tablet    . lansoprazole (PREVACID) 30 MG capsule Take 30 mg by mouth daily at 12 noon.    . levalbuterol (XOPENEX HFA) 45 MCG/ACT inhaler Inhale 2 puffs into the lungs every 4 (four) hours as needed for wheezing (Q4-6 Hours PRN).    Marland Kitchen levocetirizine (XYZAL) 5 MG tablet levocetirizine 5 mg tablet  TK 1 T PO QD IN THE EVE    . levothyroxine (SYNTHROID, LEVOTHROID) 88 MCG tablet Take 1 tablet (88 mcg total) by mouth daily. 90 tablet 3  . mesalamine (LIALDA) 1.2 G EC tablet Take 2.4 g by mouth daily.    . montelukast (SINGULAIR)  10 MG tablet Take 10 mg by mouth at bedtime.     Marland Kitchen olmesartan (BENICAR) 40 MG tablet Take 1 tablet daily 90 tablet 3  . predniSONE (DELTASONE) 10 MG tablet   0  . promethazine (PHENERGAN) 25 MG tablet TAKE 1/2-1 TABLET DAILY AS NEEDED FOR NAUSEA 30 tablet 0  . rosuvastatin (CRESTOR) 10 MG tablet Take 1 tablet (10 mg total) by mouth daily. 30 tablet 11  . triamcinolone (NASACORT ALLERGY 24HR) 55 MCG/ACT AERO nasal inhaler Place 1 spray  into the nose daily.    Marland Kitchen venlafaxine XR (EFFEXOR-XR) 75 MG 24 hr capsule TAKE 2 CAPSULEs BY MOUTH EVERY DAY WITH BREAKFAST 180 capsule 3  . zolpidem (AMBIEN) 5 MG tablet TAKE 1 TABLET BY MOUTH EVERY DAY AT BEDTIME AS NEEDED. Do not combine with any other sedating medication 30 tablet 1   No current facility-administered medications for this encounter.     Physical Findings: The patient is in no acute distress. Patient is alert and oriented.  height is 5' 3.5" (1.613 m) and weight is 180 lb 12.8 oz (82 kg). Her oral temperature is 98.2 F (36.8 C). Her blood pressure is 115/77 and her pulse is 94. Her oxygen saturation is 96%.   Lungs had some mild wheezing which cleared with deep inspiration. Heart has regular rate and rhythm. No palpable cervical, supraclavicular, or axillary adenopathy. Abdomen soft, non-tender, normal bowel sounds.  Breast Exam: Left breast is large and pendulous without mass or nipple discharge. Right breast has some bruising in the UOQ, a well-healing scar in the UOQ, and a separate scar in the axillary region from sentinel lymph node procedure. The patient has some limited ROM in her right shoulder at this time.  Lab Findings: Lab Results  Component Value Date   WBC 9.6 03/28/2017   HGB 14.3 05/15/2017   HCT 41.9 03/28/2017   MCV 88.6 03/28/2017   PLT 341 03/28/2017    Radiographic Findings: No results found.  Impression:   Stage IA, pT2 pN0(i+) pMX invasive lobular carcinoma of the right breast, ER(+) / PR(+) / Her2(-), Grade 2. I recommend adjuvant radiation therapy as part of her overall management. We discussed the course of treatment, side effects, and potential toxicities of radiation therapy. She appears to understand and wishes to proceed with a course of radiation treatment. We discussed and signed the radiation oncology consent form, and a copy was retained for our records.  Plan: She will return for simulation approximately 5 weeks out from her second  surgery to proceed with radiation therapy directed at the right breast. I anticipate approximately 6.5 weeks of radiation therapy. The patient would not appear to be a good candidate for hypofractionated treatment given her large pendulous breast size.   ____________________________________  Blair Promise, PhD, MD  This document serves as a record of services personally performed by [name], MD. It was created on his behalf by Rae Lips, a trained medical scribe. The creation of this record is based on the scribe's personal observations and the provider's statements to them. This document has been checked and approved by the attending provider.

## 2017-06-05 ENCOUNTER — Encounter: Payer: Self-pay | Admitting: Family Medicine

## 2017-06-05 ENCOUNTER — Ambulatory Visit (HOSPITAL_BASED_OUTPATIENT_CLINIC_OR_DEPARTMENT_OTHER)
Admission: RE | Admit: 2017-06-05 | Discharge: 2017-06-05 | Disposition: A | Discharge status: Home / Self Care | Payer: BLUE CROSS/BLUE SHIELD | Source: Ambulatory Visit | Attending: Family Medicine | Admitting: Family Medicine

## 2017-06-05 DIAGNOSIS — R932 Abnormal findings on diagnostic imaging of liver and biliary tract: Secondary | ICD-10-CM

## 2017-06-12 ENCOUNTER — Ambulatory Visit: Payer: BLUE CROSS/BLUE SHIELD | Admitting: Pulmonary Disease

## 2017-06-12 ENCOUNTER — Ambulatory Visit (INDEPENDENT_AMBULATORY_CARE_PROVIDER_SITE_OTHER): Payer: BLUE CROSS/BLUE SHIELD

## 2017-06-12 ENCOUNTER — Ambulatory Visit (INDEPENDENT_AMBULATORY_CARE_PROVIDER_SITE_OTHER)
Admission: RE | Admit: 2017-06-12 | Discharge: 2017-06-12 | Disposition: A | Discharge status: Home / Self Care | Payer: BLUE CROSS/BLUE SHIELD | Source: Ambulatory Visit | Attending: Pulmonary Disease | Admitting: Pulmonary Disease

## 2017-06-12 ENCOUNTER — Encounter: Payer: Self-pay | Admitting: Pulmonary Disease

## 2017-06-12 VITALS — BP 122/70 | HR 104 | Temp 98.6°F | Ht 63.0 in | Wt 179.0 lb

## 2017-06-12 DIAGNOSIS — J4530 Mild persistent asthma, uncomplicated: Secondary | ICD-10-CM

## 2017-06-12 DIAGNOSIS — F419 Anxiety disorder, unspecified: Secondary | ICD-10-CM

## 2017-06-12 DIAGNOSIS — E782 Mixed hyperlipidemia: Secondary | ICD-10-CM

## 2017-06-12 DIAGNOSIS — K5090 Crohn's disease, unspecified, without complications: Secondary | ICD-10-CM

## 2017-06-12 DIAGNOSIS — F32A Depression, unspecified: Secondary | ICD-10-CM

## 2017-06-12 DIAGNOSIS — C50411 Malignant neoplasm of upper-outer quadrant of right female breast: Secondary | ICD-10-CM

## 2017-06-12 DIAGNOSIS — I10 Essential (primary) hypertension: Secondary | ICD-10-CM

## 2017-06-12 DIAGNOSIS — J309 Allergic rhinitis, unspecified: Secondary | ICD-10-CM

## 2017-06-12 DIAGNOSIS — Z170 Estrogen receptor positive status [ER+]: Secondary | ICD-10-CM

## 2017-06-12 DIAGNOSIS — F329 Major depressive disorder, single episode, unspecified: Secondary | ICD-10-CM

## 2017-06-12 DIAGNOSIS — E890 Postprocedural hypothyroidism: Secondary | ICD-10-CM

## 2017-06-12 MED ORDER — HYDROCOD POLST-CPM POLST ER 10-8 MG/5ML PO SUER: 5.0000 mL | Freq: Two times a day (BID) | ORAL | 0 refills | Status: DC | PRN

## 2017-06-12 MED ORDER — METHYLPREDNISOLONE ACETATE 80 MG/ML IJ SUSP
80.0000 mg | Freq: Once | INTRAMUSCULAR | Status: AC
  Administered 2017-06-12: 80 mg via INTRAMUSCULAR

## 2017-06-12 MED ORDER — METHYLPREDNISOLONE 8 MG PO TABS: 8.0000 mg | ORAL_TABLET | Freq: Every day | ORAL | 0 refills | Status: DC

## 2017-06-12 NOTE — Patient Instructions (Signed)
Today we updated your med list in our EPIC system...    Continue your current medications the same...    Including your SYMBICORT, XOPENEX, & SINGULAIR...  Today we checked a follow up CXR...    We will contact you w/ the results when available...   We gave you a Depo shot today... Starting in the AM 06/13/17>  Take the MEDROL 43m tabs as follows:    Start with one tab twice daily for 4 days...    Then decrease to 1 tab each AM for 4 days...    Then decrease to 1/2 tab daily each AM for 4 days...    Then decrease to 1/2 tab every other day til gone...  You may use the TUSSIONEX cough syrup 1 tsp every 12H as needed for the coughing...    Save some or get a refill if needed when the XRT starts...  Call for any questions or if we can be of service in any way..Marland KitchenMarland Kitchen

## 2017-06-13 ENCOUNTER — Encounter: Payer: Self-pay | Admitting: Pulmonary Disease

## 2017-06-13 NOTE — Progress Notes (Signed)
Subjective:     Patient ID: Erin Fields, female   DOB: [birth year], 52 y.o.   MRN: [id number]  HPI ~  Sep 15, 2014:  Initial pulmonary consult by SN>        80 y/o WF, referred by Elyn Aquas PA at [facility] for pulmonary evaluation>  She has a hx Asthma, AR and recurrent bronchitis & sinusitis, currently managed by DrSharma- she was prev seen by [facility] in 2006 but all records are on the old paper charts;  Erin Fields is c/o 67mohx cough, prod of a small amt of green phlegm, and increased SOB/DOE progressing to problem w/ ADLs recently; she notes occ left CP "like stress on my heart", worse w/ a deep breath and when questioned further the SOB she is experiencing is a sensation that she can't get the air "IN", can't get a good deep breath & doesn't feel satisfied breathing; she also reports a hx of GERD & [facility]hn[facility] dis- [facility] she takes ?OTC Prevacid Bid but not following an anti-reflux regimen...      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       Current Meds> Dulera200- 2spBid, Singulair10, XopenexHFA (using it 1-2 times daily she says); Astelin- 2spBid, Nasacort- 2spQhs, Tussionex prn, on Allergy shots for >20y58yrrev testing allergic to everything she says) EXAM shows Afeb, VSS, O2sat=96% on RA;  HEENT- neg, mallampati1;  Chest- clear w/o w/r/r;  Heart- RR  w/o m/r/g;  Abd- soft, neg;  Ext- w/o c/c/e...   Old data from Paper Chart is not available to review...  CXR 08/13/14 showed norm heart size, mild right diaph eventration, clear lungs, NAD...   LABS 5/16:  Chems- wnl x BS=122, A1c=6.4;  CBC- wnl, diff not done- older data shows norm diff (1%eos);  TSH=0.18;   She had EColi UTI 5/16 treated w/ Ceftin500Bid...   Spirometry 09/15/14 showed FVC=2.75 (87%), FEV1=2.33 (90%), %1sec=85, mid-flows were wnl at 105% predicted...   Additional LABS> IgE level= 129, RAST panel all neg x Ragweed at 0.16 (low titer)...   CT Chest >> done 09/18/14 & showed norm heart size, min atherosclerotic changes in Ao, no adenopathy; tiny 3mm 60m nodules, min emphysema, otherw clear lungs; asymmetric elev of right hemidiaph...  IMP/PLAN>>  KellyAnnalizaa signif remote smoking hx, but her current PFTs are wnl mitigating against COPD & in favor of her Asthma diagnosis;  She appears to be well controlled on her current regimen w/ Dulera, Singulair, Xopenex, Tussionex;  The IgE level is sl elev but the RAST panel is ok x low level Ragweed antibodies;  She appears to get freq upper resp (sinus/ bronchitis) & this exacerbates her asthma;  In addition she has a long hx of GERD on OTC Prevacid  Bid but no following an antireflux regimen- we reviewed the needed elev HOB, NPO after dinner, etc... She is asked to start back on an exercise program eg- bike or swimming (non-weight bearing due to her knee).  ~  November 17, 2014:  22moROV w/ SN>  Erin Fields that she is improved- "it's all good" she says; cough & sput diminished and breathing improved...    Allergic Rhinitis> IgE level was 129 but RAST tests all neg x ragweed; on Singulair, OTC Antihist prn & Nasacort...    Asthma & AB> CXR w/ sl elev of right diaph & CT w/ tiny 321mRUL nodules; on AdvairHFA-110 2spBid (due to insurance) & improved, reminded to rinse etc...    Ex-smoker> former heavy smoker but quit cold [place] ago...  Spirometry wnl 5/16.    GERD> on Prevacid30Bid, [facility]...    Medical issues include> HBP, HL, GERD, Crohn's, DJD, HAs, Depression... PCP is CoElyn Aquast LeSterlingWe reviewed prob list, meds, xrays and labs>>  IMP/PLAN>>  KePaulines reassured regarding her lung health despite her remote smoking hx; breathing is improved on the Advair110HFA inhaler; she will continue this & her allergy regimen w/ Singulair, antihist, Nasacort, etc; she is also advised to be vigorous in her support of the antireflux regimen & we reviewed this assoc w/ asthma exacerbations... We will plan a routine f/u 1y59yrconsider f/u CT to check the tiny nodules at that time, she will call prn any questions or problems...   ~  November 18, 2015:  14yr50yr w/ SN>  KellPaquitaurns & reports a good yr- no new complaints or concerns;  She is stable on her regimen w/ Dulera200-2spBid regularly, Singulair10, XopenexHFA rescue inhaler prn (syas she rarely need it), Nasacort spray once daily & allergy shots from DrSharma once per wk;  She denies any asthma attacks or resp exacerbations over the last yr, but was treated w/ ZPak, Prednisone, NEB rx 06/2015 by DrSharma...    She saw DrChen-VVS 09/02/15> chr ven insuffic & swelling, she had right pop vein stripping yrs ago in her 20's, she has venous reflux in right leg; REC rx w/ compression hose, elev, low sodium; consider sclerotherapy later...  We reviewed the following medical problems during today's office visit >>     Allergic Rhinitis> IgE level was 129 but RAST tests all neg x ragweed; on Singulair, OTC Antihist prn, Nasacort, and she is followed by DrSharma on allergy shots once per wk...    Asthma & AB> CXR w/ sl elev of right diaph & CT w/ tiny 3mm 38m nodules; on Dulera200-2spBid, XopenexHFA rescue inhaler prn & improved, reminded to rinse etc; she is allergic to ACE inhibitors...    Ex-smoker> former heavy smoker but quit cold turkeKuwaityrs ago... Spirometry wnl 5/16.    GERD> prev on Prevacid30Bid & antireflux regimen, [facility] from [facility], she is now on LIALDA1.2g- taking 2/d and prn Phenergan...    Medical issues include> HBP (Benicar40/d), HL (Mevacor40Qod), Hypothyroid (Synthroid100), GERD, Crohn's (Lialda-2/d [facility], HAs, Depression/ insomnia (Effexor/ Ambien)... PCP is Cody Elyn AquaseB-HHartford Financialce. EXAM shows Afeb, VSS, O2sat=95% on RA;  HEENT- neg, mallampati2;  Chest- clear w/o w/r/r;  Heart- RR w/o m/r/g;  Abd- soft, nontender, neg;  Ext- w/o c/c/e;  Neuro- wnl, no focal abn...  Last CXR was 01/19/15>  Norm heart size, clear lungs, sl ant eventration  of right hemidiaph w/o change...  LABS 08/2015 in Epic>  FLP- not at goals;  Chems- ok w/ BS=105, A1c=6.4Cr=0.64, LFTs wnl;  CBC- Hg=12.9, mcv=83, Ferritin=12;  TSH= 0.20 IMP/PLAN>>  Erin Fields remains stable on her regimen including: Dulera200-2spBid, Singulair10, XopenexHFA prn, plus allergy shots per DrSharma...   ~  November 29, 2016:  1 year ROV & pulmonary follow up visit>  Erin Fields returns and reorts a good yr, asthma doing well on Symbicort160-2spBid (per insurance change) & XopenexHFA rescue inhaler prn & Singulair10;  She continues to f/u w/ DrSharma for allergy- on shots weekly Nasacort, Astelin;  Her PCP is Dr. Janett Billow Copland;  Recently noted some head congestion, drainage, mild wheezing & doesn't want it to go into asthma attack=> we discussed regular dosing & add Medrol dosepak... Review of Epic notes >>     She saw DrLawson-VVS 02/08/16> hx painful VV wearing compression hose & using Ibuprofen; he offered her foam sclerotherapy to consider...    She saw PCP-DrCopland 03/29/16 & 08/21/16>  Medical check- TSH low at 0.20, hx dysthymia & incr depression since daugh Alinda Sierras age 56) committed suicide in the past yr, not resting, ahedonia- meds adjusted...    She saw GYN- DrKaplan- 08/03/16>  Note reviewed, c/o menopausal symptoms and  placed on Activella0.5/0.1 tab daily...    She f/u w/ DrSharma-Allergy 08/09/16>  C/o sinusitis, hx AR, mild persistent asthma; Rx Dymista, Astelin, Flonase, Xyzal, singulair & Rx w/ Depomedrol & Omnicef...    She saw Ortho-DrCaffrey 11/15/16 for 3rd of 3 Synvisc shots to left knee for OA... We reviewed the following medical problems during today's office visit>     Allergic Rhinitis> IgE level was 129 but RAST tests all neg x ragweed; on Singulair, OTC Antihist prn, Nasacort, and she is followed by DrSharma on allergy shots once per wk...    Asthma & AB> CXR w/ sl elev of right diaph & CT w/ tiny 80m RUL nodules; now on Symbicort160-2spBid, XopenexHFA rescue inhaler prn & improved, reminded to rinse etc; she is allergic to ACE inhibitors...    Ex-smoker> former heavy smoker but quit cold [place] ago... Spirometry wnl 08/2014.    GERD, Crohns, liver hemangioma & NAFLD> prev on Prevacid30Bid & antireflux regimen, [facility] we do not have notes from him, she is now on LIALDA1.2g- taking 2/d and prn Phenergan...    Medical issues include> HBP (Benicar40/d), chr VI w/ vein stripping, HL (Mevacor40Qod), Hypothyroid (Synthroid88), GERD, [facility], DJD, HAs, Depression/ insomnia (Effexor/ Ambien)... EXAM shows Afeb, VSS, Wt=176#, O2sat=96% on RA;  HEENT- neg, mallampati2;  Chest- clear w/o w/r/r;  Heart- RR w/o m/r/g;  Abd- soft, nontender, neg;  Ext- w/o c/c/e;  Neuro- wnl, no focal abn...  LABS in Epic 08/2016> Chems- ok w/ BS=99, A1c=6.6;  TSH=0.22 & her Synthroid was decr to 861m... IMP/PLAN>>  We decided to treat w/ a medrol dosepak & continue her Asthma rx w/ Symbicort160-2spBid & XopenexHFA prn; she has been under a lot of stress, discussed & support provided- Rx per PCP; she will call for any additional problems or exacerbations...    ~  June 12, 2017:  31m965moV & add-on appt requested for refractory URI/ AB>  Erin Fields c/o 65mo73mocough, chest congestion,  yellow sput, & wheezing and has been seen several times by DrSharma, given Prednisone + Augmentin x2 but w/ recurrent/ lingering symptoms;  She was also Dx w/ right breast cancer in [id number] treated by DrByerly, DrFeng, DrKinard=>  the resp symptoms started after her 2nd surg, they have rec adjuvant XRT to start after recovery from her surg... She notes that her 43 y/o son recently had a URI, she states that hydrocodone caused wheezing in the past...  We reviewed the following medical problems during today's office visit>     Allergic Rhinitis> IgE level was 129 but RAST tests all neg x ragweed; on Singulair, OTC Antihist prn, Nasacort, Astelin and she is followed by DrSharma on allergy shots once per wk...    Asthma & AB> CXR w/ sl elev of right diaph & CT w/ tiny 38m RUL nodules; on Symbicort160-2spBid, XopenexHFA rescue inhaler prn & improved, reminded to rinse etc; she is allergic to ACE inhibitors; refractory AB exac 05/2017=> see below    Ex-smoker> former heavy smoker but quit cold [place] ago... Spirometry wnl 08/2014.    GERD, Crohns, liver hemangioma & NAFLD> prev on Prevacid30Bid & antireflux regimen, [facility] we do not have notes from him, she is now on LIALDA1.2g- taking 2/d and prn Phenergan...    Medical issues include> HBP (Benicar40/d), chr VI w/ vein stripping, HL (Crestor10), Hypothyroid (Synthroid88), GERD, [facility], DJD, HAs, Depression/ insomnia (Effexor/ Valium/ Ambien)... EXAM shows Afeb, VSS, Wt=179#, O2sat=96% on RA;  HEENT- neg, mallampati2, parotid hypertrophy;  Chest- decr BS bilat w/ few rhonchi at bases, no wheezing/ rales/ consolidation;  S/P right breast surg;  Heart- RR w/o m/r/g;  Abd- obese, soft, nontender, neg;  Ext- w/o c/c/e;  Neuro- wnl, no focal abn...  CXR 06/12/17>  Norm heart size, essentially clear lungs, sl incr markings- NAD, sl pleural thickening noted... IMP/PLAN>>  We discussed her refractory AB & recommend-  continue Symbicort160-2spBid, rescue inhaler prn, Singulair10, etc; we gave her a Depomedrol shot & started a tapering course of oral Medrol tablets (see AVS) and we wrote for Tussionex to help her cough...    Past Medical History  Diagnosis Date  . Asthma >> see above...   . Depression >> on EffexorXR75 and Ambien5...   . Frequent headaches   . GERD (gastroesophageal reflux disease) >> prev on Prevacid Bid   . Hay fever   . Crohn's Disease >> on Lialda 1.2gm[facility]  . Elevated blood pressure >> on Benicar40...   . Hyperlipidemia >> on Mevacor40 (only take Qod)   . Migraines   . Colon polyps   . Thyroid disease >> on Synthroid125=> decr to 100 by PCP   . Chicken pox     Past Surgical History:  Procedure Laterality Date  . ABLATION  2011  . BREAST LUMPECTOMY WITH RADIOACTIVE SEED AND SENTINEL LYMPH NODE BIOPSY Right 04/24/2017   Procedure: RIGHT BREAST LUMPECTOMY WITH RADIOACTIVE SEED AND SENTINEL LYMPH NODE BIOPSY;  Surgeon: BStark Klein MD;  [facility]  Service: General;  Laterality: Right;  . FOOT SURGERY Right 2012  . HAND SURGERY Right   . RE-EXCISION OF BREAST LUMPECTOMY Right 05/15/2017   Procedure: RE-EXCISION OF BREAST LUMPECTOMY;  Surgeon: BStark Klein MD;  [facility] [facility]  Service: General;  Laterality: Right;  . Septoplasty with turbinate reduction    . TONSILLECTOMY  1980  . VEIN SURGERY     Removal Right Leg  . WISDOM TOOTH EXTRACTION      Outpatient Encounter Medications as of 06/12/2017  Medication Sig  . acyclovir (ZOVIRAX) 400 MG tablet Take 1 tablet (400 mg total) by mouth 3 (three) times daily as needed.  .Marland Kitchen  amoxicillin-clavulanate (AUGMENTIN) 875-125 MG tablet Take 1 tablet by mouth 2 (two) times daily.  Marland Kitchen azelastine (ASTELIN) 0.1 % nasal spray 2 sprays 2 (two) times daily.  . budesonide-formoterol (SYMBICORT) 160-4.5 MCG/ACT inhaler Inhale 2 puffs into the lungs 2 (two) times daily.  . diazepam  (VALIUM) 2 MG tablet Take 1 tablet (2 mg total) every 6 (six) hours as needed by mouth for anxiety. Do not combine with ambien  . EPINEPHrine 0.3 mg/0.3 mL IJ SOAJ injection epinephrine 0.3 mg/0.3 mL injection, auto-injector  TK UTD PRN  . hyoscyamine (ANASPAZ) 0.125 MG TBDP disintergrating tablet hyoscyamine 0.125 mg disintegrating tablet  . lansoprazole (PREVACID) 30 MG capsule Take 30 mg by mouth daily at 12 noon.  . levalbuterol (XOPENEX HFA) 45 MCG/ACT inhaler Inhale 2 puffs into the lungs every 4 (four) hours as needed for wheezing (Q4-6 Hours PRN).  Marland Kitchen levocetirizine (XYZAL) 5 MG tablet levocetirizine 5 mg tablet  TK 1 T PO QD IN THE EVE  . levothyroxine (SYNTHROID, LEVOTHROID) 88 MCG tablet Take 1 tablet (88 mcg total) by mouth daily.  . mesalamine (LIALDA) 1.2 G EC tablet Take 2.4 g by mouth daily.  . montelukast (SINGULAIR) 10 MG tablet Take 10 mg by mouth at bedtime.   Marland Kitchen olmesartan (BENICAR) 40 MG tablet Take 1 tablet daily  . promethazine (PHENERGAN) 25 MG tablet TAKE 1/2-1 TABLET DAILY AS NEEDED FOR NAUSEA  . rosuvastatin (CRESTOR) 10 MG tablet Take 1 tablet (10 mg total) by mouth daily.  Marland Kitchen triamcinolone (NASACORT ALLERGY 24HR) 55 MCG/ACT AERO nasal inhaler Place 1 spray into the nose daily.  Marland Kitchen venlafaxine XR (EFFEXOR-XR) 75 MG 24 hr capsule TAKE 2 CAPSULEs BY MOUTH EVERY DAY WITH BREAKFAST  . zolpidem (AMBIEN) 5 MG tablet TAKE 1 TABLET BY MOUTH EVERY DAY AT BEDTIME AS NEEDED. Do not combine with any other sedating medication  . [DISCONTINUED] predniSONE (DELTASONE) 10 MG tablet   . chlorpheniramine-HYDROcodone (TUSSIONEX PENNKINETIC ER) 10-8 MG/5ML SUER Take 5 mLs by mouth every 12 (twelve) hours as needed for cough (cough).  . methylPREDNISolone (MEDROL) 8 MG tablet Take 1 tablet (8 mg total) by mouth daily.   No facility-administered encounter medications on file as of 06/12/2017.     Allergies  Allergen Reactions  . Ace Inhibitors Other (See Comments)    Angina  . Aspirin    . Cefaclor   . Lisinopril Cough  . Losartan Potassium   . Oxycodone Hives  . Sulfonamide Derivatives     Immunization History  Administered Date(s) Administered  . Influenza Inj Mdck Quad Pf 01/21/2016  . Influenza,inj,Quad PF,6+ Mos 12/20/2016  . Influenza-Unspecified 12/16/2012, 01/15/2014  . Pneumococcal Polysaccharide-23 04/17/2004, 04/14/2013  . Td 04/17/1997  . Tdap 07/22/2008    Current Medications, Allergies, Past Medical History, Past Surgical History, Family History, and Social History were reviewed in [employer] record.   Review of Systems            All symptoms NEG except where BOLDED >>  Constitutional:  F/C/S, fatigue, anorexia, unexpected weight change. HEENT:  HA, visual changes, hearing loss, earache, nasal symptoms, sore throat, mouth sores, hoarseness. Resp:  cough, sputum, hemoptysis; SOB, tightness, wheezing. Cardio:  CP, palpit, DOE, orthopnea, edema. GI:  N/V/D/C, blood in stool; reflux, abd pain, distention, gas. GU:  dysuria, freq, urgency, hematuria, flank pain, voiding difficulty. MS:  joint pain, swelling, tenderness, decr ROM; neck pain, back pain, etc. Neuro:  HA, tremors, seizures, dizziness, syncope, weakness, numbness, gait abn. Skin:  suspicious lesions or skin rash. Heme:  adenopathy, bruising, bleeding. Psyche:  confusion, agitation, sleep disturbance, hallucinations, anxiety, depression suicidal.   Objective:   Physical Exam      Vital Signs:  Reviewed...   General:  WD, WN, 52 y/o WF in NAD; alert & oriented; pleasant & cooperative... HEENT:  [place]/AT; Conjunctiva- pink, Sclera- nonicteric, EOM-wnl, PERRLA, EACs-clear, TMs-wnl; NOSE-clear; THROAT-clear & wnl.  Neck:  Supple w/ full ROM; no JVD; normal carotid impulses w/o bruits; no thyromegaly or nodules palpated; no lymphadenopathy.  Chest:  Clear to P & A; without wheezes, rales, or rhonchi heard. Heart:  Regular Rhythm; norm S1 & S2 without murmurs, rubs, or  gallops detected. Abdomen:  Soft & nontender- no guarding or rebound; normal bowel sounds; no organomegaly or masses palpated. Ext:  decrROM; without deformities, + arthritic changes; no varicose veins, venous insuffic, or edema;  Pulses intact w/o bruits. Neuro:  No focal neuro findings; gait normal & balance OK. Derm:  No lesions noted; no rash etc. Lymph:  No cervical, supraclavicular, axillary, or inguinal adenopathy palpated.   Assessment:      IMP >>     Allergic Rhinitis> IgE level was 129 but RAST tests all neg x ragweed; on Singulair, OTC Antihist prn & Nasacort...    Asthma & AB> CXR w/ sl elev of right diaph & CT w/ tiny 48m RUL nodules; on AdvairHFA-110 2spBid (due to insurance) & improved, reminded to rinse etc...    Ex-smoker> former heavy smoker but quit cold [place] ago... Spirometry wnl 5/16.    GERD> on Prevacid30Bid, [facility] we reviewed the antireflux regimen...    Medical issues include> HBP, HL, GERD, Crohn's, DJD, HAs, Depression... [place]   PLAN >>  09/15/14>   KMargarethhas a signif remote smoking hx, but her current PFTs are wnl mitigating against COPD & in favor of her Asthma diagnosis;  She appears to be well controlled on her current regimen w/ Dulera, Singulair, Xopenex, Tussionex;  The IgE level is sl elev but the RAST panel is ok x low level Ragweed antibodies;  She appears to get freq upper resp (sinus/ bronchitis) & this exacerbates her asthma;  In addition she has a long hx of GERD on OTC Prevacid Bid but no following an antireflux regimen- we reviewed the needed elev HOB, NPO after dinner, etc... She is asked to start back on an exercise program eg- bike or swimming (non-weight bearing due to her knee)... 11/18/15>   Erin Fields stable on her regimen including: Dulera200-2spBid, Singulair10, XopenexHFA prn, plus allergy shots per DrSharma...  11/29/16>   We decided to treat w/ a medrol dosepak & continue her  Asthma rx w/ Symbicort160-2spBid & XopenexHFA prn; she has been under a lot of stress, discussed & support provided- Rx per PCP; she will call for any additional problems or exacerbations 06/12/17>   We discussed her refractory AB & recommend- continue Symbicort160-2spBid, rescue inhaler prn, Singulair10, etc; we gave her a Depomedrol shot & started a tapering course of oral Medrol tablets (see AVS) and we wrote for Tussionex to help her cough.   Plan:     Patient's Medications  New Prescriptions   CHLORPHENIRAMINE-HYDROCODONE (TUSSIONEX PENNKINETIC ER) 10-8 MG/5ML SUER    Take 5 mLs by mouth every 12 (twelve) hours as needed for cough (cough).   METHYLPREDNISOLONE (MEDROL) 8 MG TABLET    Take 1 tablet (8 mg total) by mouth daily.  Previous Medications  ACYCLOVIR (ZOVIRAX) 400 MG TABLET    Take 1 tablet (400 mg total) by mouth 3 (three) times daily as needed.   AMOXICILLIN-CLAVULANATE (AUGMENTIN) 875-125 MG TABLET    Take 1 tablet by mouth 2 (two) times daily.   AZELASTINE (ASTELIN) 0.1 % NASAL SPRAY    2 sprays 2 (two) times daily.   BUDESONIDE-FORMOTEROL (SYMBICORT) 160-4.5 MCG/ACT INHALER    Inhale 2 puffs into the lungs 2 (two) times daily.   DIAZEPAM (VALIUM) 2 MG TABLET    Take 1 tablet (2 mg total) every 6 (six) hours as needed by mouth for anxiety. Do not combine with ambien   EPINEPHRINE 0.3 MG/0.3 ML IJ SOAJ INJECTION    epinephrine 0.3 mg/0.3 mL injection, auto-injector  TK UTD PRN   HYOSCYAMINE (ANASPAZ) 0.125 MG TBDP DISINTERGRATING TABLET    hyoscyamine 0.125 mg disintegrating tablet   LANSOPRAZOLE (PREVACID) 30 MG CAPSULE    Take 30 mg by mouth daily at 12 noon.   LEVALBUTEROL (XOPENEX HFA) 45 MCG/ACT INHALER    Inhale 2 puffs into the lungs every 4 (four) hours as needed for wheezing (Q4-6 Hours PRN).   LEVOCETIRIZINE (XYZAL) 5 MG TABLET    levocetirizine 5 mg tablet  TK 1 T PO QD IN THE EVE   LEVOTHYROXINE (SYNTHROID, LEVOTHROID) 88 MCG TABLET    Take 1 tablet (88 mcg total)  by mouth daily.   MESALAMINE (LIALDA) 1.2 G EC TABLET    Take 2.4 g by mouth daily.   MONTELUKAST (SINGULAIR) 10 MG TABLET    Take 10 mg by mouth at bedtime.    OLMESARTAN (BENICAR) 40 MG TABLET    Take 1 tablet daily   PROMETHAZINE (PHENERGAN) 25 MG TABLET    TAKE 1/2-1 TABLET DAILY AS NEEDED FOR NAUSEA   ROSUVASTATIN (CRESTOR) 10 MG TABLET    Take 1 tablet (10 mg total) by mouth daily.   TRIAMCINOLONE (NASACORT ALLERGY 24HR) 55 MCG/ACT AERO NASAL INHALER    Place 1 spray into the nose daily.   VENLAFAXINE XR (EFFEXOR-XR) 75 MG 24 HR CAPSULE    TAKE 2 CAPSULEs BY MOUTH EVERY DAY WITH BREAKFAST   ZOLPIDEM (AMBIEN) 5 MG TABLET    TAKE 1 TABLET BY MOUTH EVERY DAY AT BEDTIME AS NEEDED. Do not combine with any other sedating medication  Modified Medications   No medications on file  Discontinued Medications   PREDNISONE (DELTASONE) 10 MG TABLET

## 2017-06-15 ENCOUNTER — Encounter: Payer: Self-pay | Admitting: Physical Therapy

## 2017-06-15 ENCOUNTER — Other Ambulatory Visit: Payer: Self-pay

## 2017-06-15 ENCOUNTER — Ambulatory Visit: Payer: BLUE CROSS/BLUE SHIELD | Attending: General Surgery | Admitting: Physical Therapy

## 2017-06-15 DIAGNOSIS — Z483 Aftercare following surgery for neoplasm: Secondary | ICD-10-CM | POA: Insufficient documentation

## 2017-06-15 DIAGNOSIS — M25511 Pain in right shoulder: Secondary | ICD-10-CM | POA: Diagnosis present

## 2017-06-15 DIAGNOSIS — M6281 Muscle weakness (generalized): Secondary | ICD-10-CM | POA: Diagnosis present

## 2017-06-15 DIAGNOSIS — R222 Localized swelling, mass and lump, trunk: Secondary | ICD-10-CM | POA: Insufficient documentation

## 2017-06-15 DIAGNOSIS — M25611 Stiffness of right shoulder, not elsewhere classified: Secondary | ICD-10-CM

## 2017-06-15 NOTE — Patient Instructions (Signed)
SHOULDER: Flexion - Supine (Cane)        Cancer [facility] [phone] ° ° ° °Hold cane in both hands. Raise arms up overhead. Do not allow back to arch. Hold _5__ seconds. Do __5-10__ times; __1-2__ times a day. ° ° °SELF ASSISTED WITH OBJECT: Shoulder Abduction / Adduction - Supine ° ° ° °Hold cane with both hands. Move both arms from side to side, keep elbows straight.  Hold when stretch felt for __5__ seconds. Repeat __5-10__ times; __1-2__ times a day. Once this becomes easier progress to third picture bringing affected arm towards ear by staying out to side. Same hold for _5_seconds. Repeat  _5-10_ times, _1-2_ times/day. ° °Shoulder Blade Stretch ° ° ° °Clasp fingers behind head with elbows touching in front of face. Pull elbows back while pressing shoulder blades together. Relax and hold as tolerated, can place pillow under elbow here for comfort as needed and to allow for prolonged stretch.  °Repeat __5__ times. Do __1-2__ sessions per day. ° ° ° ° ° ° °

## 2017-06-15 NOTE — Therapy (Signed)
[place], [place], [address] Phone: [phone]   Fax:  [phone]  Physical Therapy Evaluation  Patient Details  Name: Erin Fields MRN: [id number] Date of Birth: January 05, 1966 Referring Provider: Dr. [name]    Encounter Date: 06/15/2017  PT End of Session - 06/15/17 1332    Visit Number  1    Number of Visits  9    Date for PT Re-Evaluation  07/20/17    PT Start Time  0805    PT Stop Time  0845    PT Time Calculation (min)  40 min    Activity Tolerance  Patient tolerated treatment well    Behavior During Therapy  [facility] for tasks assessed/performed       Past Medical History:  Diagnosis Date  . Allergy   . Anxiety   . Asthma   . Cancer [facility])    right breast cancer  . Chicken pox   . Colon polyps   . Depression   . Elevated blood pressure   . Environmental allergies   . Frequent headaches   . GERD (gastroesophageal reflux disease)   . Hay fever   . Hyperlipidemia   . Hypertension   . Hypothyroidism   . Migraines   . PONV (postoperative nausea and vomiting)   . Thyroid disease   . Ulcerative colitis ([place])   . Varicose veins     Past Surgical History:  Procedure Laterality Date  . ABLATION  2011  . BREAST LUMPECTOMY WITH RADIOACTIVE SEED AND SENTINEL LYMPH NODE BIOPSY Right 04/24/2017   Procedure: RIGHT BREAST LUMPECTOMY WITH RADIOACTIVE SEED AND SENTINEL LYMPH NODE BIOPSY;  Surgeon: [name], MD;  Location: [place];  Service: General;  Laterality: Right;  . FOOT SURGERY Right 2012  . HAND SURGERY Right   . RE-EXCISION OF BREAST LUMPECTOMY Right 05/15/2017   Procedure: RE-EXCISION OF BREAST LUMPECTOMY;  Surgeon: [name], MD;  Location: [place];  Service: General;  Laterality: Right;  . Septoplasty with turbinate reduction    . TONSILLECTOMY  1980  . VEIN SURGERY     Removal Right Leg  . WISDOM TOOTH EXTRACTION      There were no vitals  filed for this visit.   Subjective Assessment - 06/15/17 0808    Subjective  I tend to tense my left shoulder.      Pertinent History  right sided breast cancer diagnosed around 02/15/2017.  Had lumpectomy with 3 nodes removed on1/11/2017 with re-excision on 05/15/2017 and developed swelling after both surgeries and has had 2 aspiration since  Will not have to have chemo but will have radiation.  medical oncologist is Dr. Burr Medico  Past history includes asthma and arthritis in her knee     Patient Stated Goals  to get shoulder back moving again so she can get her radiation     Currently in Pain?  Yes    Pain Score  4     Pain Location  Axilla    Pain Orientation  Right    Pain Descriptors / Indicators  Burning    Pain Type  Acute pain    Pain Radiating Towards  none    Pain Onset  1 to 4 weeks ago    Aggravating Factors   moving it     Pain Relieving Factors  heat and ice helps     Effect of Pain on Daily Activities  has been too afraid to use  it too much          [facility] PT Assessment - 06/15/17 0001      Assessment   Medical Diagnosis  right breast cancer     Referring Provider  Dr. [name]     Onset Date/Surgical Date  02/15/17    Hand Dominance  Right      Precautions   Precautions  None;Shoulder      Restrictions   Weight Bearing Restrictions  No      Balance Screen   Has the patient fallen in the past 6 months  Yes    How many times?  1 will not be addressed at this time     Has the patient had a decrease in activity level because of a fear of falling?   No    Is the patient reluctant to leave their home because of a fear of falling?   No      Home Environment   Living Environment  Private residence    Living Arrangements  Children 17     Available Help at Discharge  Available PRN/intermittently      Prior Function   Level of Independence  Independent    Vocation  Full time employment fatigue and pain at end of the day     Vocation Requirements  works at a computer      Leisure  does not exercise, likes to read       Cognition   Overall Cognitive Status  Within Functional Limits for tasks assessed      Observation/Other Assessments   Observations  Pt with large breasts, right is visibly about 20% larger that left.  She has well healed incision in right axill and at right mid outer half of right breast that is bruised from recent drainage of seroma. She has visible fullness in axilla    Skin Integrity  well healed incisions    Quick DASH   38.64      Sensation   Light Touch  Not tested      Coordination   Gross Motor Movements are Fluid and Coordinated  No RUE is limited by pain       Posture/Postural Control   Posture/Postural Control  Postural limitations    Postural Limitations  Rounded Shoulders;Forward head    Posture Comments  Pt with body type of larger torso and smaller extremities       AROM   Overall AROM Comments  scapula dyskinesia with decreased scapular stabalization     Right Shoulder Flexion  130 Degrees    Right Shoulder ABduction  95 Degrees with pain     Right Shoulder External Rotation  90 Degrees    Left Shoulder Flexion  165 Degrees    Left Shoulder ABduction  165 Degrees    Left Shoulder External Rotation  90 Degrees      Strength   Right Shoulder Flexion  3-/5    Right Shoulder ABduction  3-/5 limited by shap pain       Palpation   Palpation comment  pt has tenderness to palpation at pec majoy tendon         LYMPHEDEMA/ONCOLOGY QUESTIONNAIRE - 06/15/17 0836      Right Upper Extremity Lymphedema   10 cm Proximal to Olecranon Process  27 cm    Olecranon Process  24.5 cm    15 cm Proximal to Ulnar Styloid Process  25 cm    10 cm Proximal to Ulnar Styloid Process  22.5 cm    Just Proximal to Ulnar Styloid Process  15.5 cm    Across Hand at [employer]  19 cm    At [place] of 2nd Digit  6 cm      Left Upper Extremity Lymphedema   10 cm Proximal to Olecranon Process  28 cm    Olecranon Process  24.5 cm    15 cm  Proximal to Ulnar Styloid Process  24.6 cm    10 cm Proximal to Ulnar Styloid Process  22.6 cm    Just Proximal to Ulnar Styloid Process  15.5 cm    Across Hand at [employer]  19 cm    At [place] of 2nd Digit  6 cm        Quick Dash - 06/15/17 0001    Open a tight or new jar  Mild difficulty    Do heavy household chores (wash walls, wash floors)  Moderate difficulty    Carry a shopping bag or briefcase  Mild difficulty    Wash your back  Mild difficulty    Use a knife to cut food  Mild difficulty    Recreational activities in which you take some force or impact through your arm, shoulder, or hand (golf, hammering, tennis)  Unable    During the past week, to what extent has your arm, shoulder or hand problem interfered with your normal social activities with family, friends, neighbors, or groups?  Slightly    During the past week, to what extent has your arm, shoulder or hand problem limited your work or other regular daily activities  Quite a bit    Arm, shoulder, or hand pain.  Moderate    Tingling (pins and needles) in your arm, shoulder, or hand  None    Difficulty Sleeping  Mild difficulty    DASH Score  38.64 %       Objective measurements completed on examination: See above findings.      [facility] Adult PT Treatment/Exercise - 06/15/17 0001      Self-Care   Self-Care  Other Self-Care Comments    Other Self-Care Comments   provided small piece of foam for pt to wear inside of sports bra at site of seroma drainage       Shoulder Exercises: Supine   Other Supine Exercises  supine dowel flexion, abduciton and hands behind head with elbow supported to simulate radiation position              PT Education - 06/15/17 1324    Education provided  Yes    Education Details  supine cane exercises     Person(s) Educated  Patient    Methods  Explanation;Demonstration;Handout    Comprehension  Verbalized understanding;Returned demonstration          PT Long Term Goals  - 06/15/17 1338      PT LONG TERM GOAL #1   Title  Pt will be independent in lymphedema risk reduction practices    Time  4    Period  Weeks    Status  New      PT LONG TERM GOAL #2   Title  Pt will have > 125 degrees of painful shoulder abduction so that she can received radiation treatment     Time  4    Period  Weeks    Status  New      PT LONG TERM GOAL #3   Title  Pt will be independent  in a home exercise program for ROM and Strength of Upper body     Time  4    Period  Weeks    Status  New      PT LONG TERM GOAL #4   Title  Pt will be indpendent in self manual lymph draiange and use of compression for right breast lymphedema     Time  4    Period  Weeks    Status  New             Plan - 06/15/17 1332    Clinical Impression Statement  52 yo female with lumpecomty with 3 nodes removed with another surgery to get clean margins and 2 aspirations for seroma comes in with larger right breast and axillary swelling and painful decreased shoudler ROM and strength.  She is at risk for ongoing  lymphedema     History and Personal Factors relevant to plan of care:  2 breast surgeries, seroma, works full time at [employer] due to:  to have radiation     Clinical Decision Making  Moderate    Rehab Potential  Good    Clinical Impairments Affecting Rehab Potential  seroma, 3 nodes removed, will have radiation     PT Frequency  2x / week    PT Duration  4 weeks    PT Treatment/Interventions  ADLs/Self Care Home Management;Taping;Scar mobilization;Passive range of motion;Compression bandaging;Manual lymph drainage;Manual techniques;Patient/family education;Therapeutic exercise;Therapeutic activities    PT Next Visit Plan  shoulder A/AA/PROM to prepare for radiation.  teach MLD for right breast, assess compression bras if pt brings them in.  see if foam patch helped.  Later teach Strength ABC program  make sure pt is signed up for  ABC class.  may need to put pt on hold during radition and return after it is complete to progress strengthening program     Consulted and Agree with Plan of Care  Patient       Patient will benefit from skilled therapeutic intervention in order to improve the following deficits and impairments:  Decreased knowledge of use of DME, Pain, Postural dysfunction, Decreased range of motion, Decreased strength, Impaired perceived functional ability, Obesity, Increased edema, Decreased knowledge of precautions  Visit Diagnosis: Aftercare following surgery for neoplasm - Plan: PT plan of care cert/re-cert  Localized swelling, mass and lump, trunk - Plan: PT plan of care cert/re-cert  Acute pain of right shoulder - Plan: PT plan of care cert/re-cert  Stiffness of right shoulder, not elsewhere classified - Plan: PT plan of care cert/re-cert  Muscle weakness (generalized) - Plan: PT plan of care cert/re-cert     Problem List Patient Active Problem List   Diagnosis Date Noted  . Controlled type 2 diabetes mellitus without complication, without long-term current use of insulin ([place]) 05/28/2017  . Malignant neoplasm of upper-outer quadrant of right breast in female, estrogen receptor positive ([place]) 03/20/2017  . Crohn disease ([place]) 11/29/2016  . Borderline diabetes 12/17/2015  . Depression 12/17/2015  . Ex-smoker 11/18/2015  . Chronic venous insufficiency 09/02/2015  . Symptomatic varicose veins, right 09/02/2015  . Eustachian tube dysfunction 12/04/2014  . Allergic rhinitis 09/15/2014  . Dyspnea 09/15/2014  . Liver hemangioma 08/24/2014  . Elevated triglycerides with high cholesterol 11/26/2013  . Insomnia 08/20/2013  . Hypertension 08/20/2013  . Ulcerative colitis, unspecified 04/20/2013  . Oral herpes 04/20/2013  . Hyperlipidemia 12/06/2006  . HEADACHE 12/06/2006  .  CERVICAL CANCER, HX OF 12/06/2006  . Hypothyroidism 11/29/2006  . Asthma 11/29/2006  . IRRITABLE BOWEL SYNDROME  11/29/2006   Donato Heinz. Owens Shark PT  Norwood Levo 06/15/2017, 1:44 PM  [place], [place], [address] Phone: [phone]   Fax:  [phone]  Name: Erin Fields MRN: [id number] Date of Birth: [birth year]

## 2017-06-18 ENCOUNTER — Encounter: Payer: Self-pay | Admitting: Radiation Oncology

## 2017-06-18 ENCOUNTER — Ambulatory Visit
Admission: RE | Admit: 2017-06-18 | Discharge: 2017-06-18 | Disposition: A | Discharge status: Home / Self Care | Payer: BLUE CROSS/BLUE SHIELD | Source: Ambulatory Visit | Attending: Radiation Oncology | Admitting: Radiation Oncology

## 2017-06-18 DIAGNOSIS — Z170 Estrogen receptor positive status [ER+]: Secondary | ICD-10-CM | POA: Diagnosis not present

## 2017-06-18 DIAGNOSIS — Z510 Encounter for antineoplastic radiation therapy: Secondary | ICD-10-CM | POA: Insufficient documentation

## 2017-06-18 DIAGNOSIS — C50411 Malignant neoplasm of upper-outer quadrant of right female breast: Secondary | ICD-10-CM

## 2017-06-18 NOTE — Progress Notes (Signed)
Rec'd from Erin Fields, her FMLA paperwork. It is given to nursing. Erin Fields requests 4 hours off for each tx and to be called when it is completed.

## 2017-06-18 NOTE — Progress Notes (Signed)
  Radiation Oncology         [phone] ________________________________  Name: Erin Fields MRN: [id number]  Date: 06/18/2017  DOB: Sep 08, 1965  SIMULATION AND TREATMENT PLANNING NOTE    ICD-10-CM   1. Malignant neoplasm of upper-outer quadrant of right breast in female, estrogen receptor positive ([place]) C50.411    Z17.0     DIAGNOSIS: Malignant neoplasm of upper-outer quadrant of right breast in female, estrogen receptor positive ([place]) Staging form: Breast, AJCC 8th Edition - Clinical stage from 03/16/2017: Stage IA (cT1b, cN0, cM0, G2, ER: Positive, PR: Positive, HER2: Negative) - Pathologic Stage pT2, pN0 (i+), pMX  NARRATIVE:  The patient was brought to the [facility].  Identity was confirmed.  All relevant records and images related to the planned course of therapy were reviewed.  The patient freely provided informed written consent to proceed with treatment after reviewing the details related to the planned course of therapy. The consent form was witnessed and verified by the simulation staff.  Then, the patient was set-up in a stable reproducible  supine position for radiation therapy.  CT images were obtained.  Surface markings were placed.  The CT images were loaded into the planning software.  Then the target and avoidance structures were contoured.  Treatment planning then occurred.  The radiation prescription was entered and confirmed.  Then, I designed and supervised the construction of a total of 3 medically necessary complex treatment devices.  I have requested : 3D Simulation  I have requested a DVH of the following structures: Lumpectomy cavity, heart, lungs.  I have ordered:dose calc.  PLAN:  The patient will receive 50.4 Gy in 28 fractions followed by a boost to the lumpectomy cavity of 10 gray for a cumulative dose of 60.4 gray.  -----------------------------------  Blair Promise, PhD, MD  This document serves as a record of services personally  performed by [name], MD. It was created on his behalf by Steva Colder, a trained medical scribe. The creation of this record is based on the scribe's personal observations and the provider's statements to them. This document has been checked and approved by the attending provider.

## 2017-06-19 ENCOUNTER — Encounter: Payer: Self-pay | Admitting: Family Medicine

## 2017-06-19 DIAGNOSIS — F430 Acute stress reaction: Secondary | ICD-10-CM

## 2017-06-20 ENCOUNTER — Ambulatory Visit: Payer: BLUE CROSS/BLUE SHIELD

## 2017-06-21 ENCOUNTER — Telehealth: Payer: Self-pay | Admitting: Hematology

## 2017-06-21 DIAGNOSIS — C50411 Malignant neoplasm of upper-outer quadrant of right female breast: Secondary | ICD-10-CM | POA: Diagnosis not present

## 2017-06-21 NOTE — Telephone Encounter (Signed)
Spoke with patient regarding appointment D/T per 3/5 sch msg

## 2017-06-22 ENCOUNTER — Encounter: Payer: Self-pay | Admitting: Physical Therapy

## 2017-06-22 ENCOUNTER — Ambulatory Visit: Payer: BLUE CROSS/BLUE SHIELD | Admitting: Physical Therapy

## 2017-06-22 DIAGNOSIS — R222 Localized swelling, mass and lump, trunk: Secondary | ICD-10-CM

## 2017-06-22 DIAGNOSIS — M25611 Stiffness of right shoulder, not elsewhere classified: Secondary | ICD-10-CM

## 2017-06-22 DIAGNOSIS — M25511 Pain in right shoulder: Secondary | ICD-10-CM

## 2017-06-22 DIAGNOSIS — Z483 Aftercare following surgery for neoplasm: Secondary | ICD-10-CM

## 2017-06-22 MED ORDER — VENLAFAXINE HCL ER 150 MG PO CP24: ORAL_CAPSULE | ORAL | 3 refills | Status: DC

## 2017-06-22 NOTE — Addendum Note (Signed)
Addended by: Lamar Blinks C on: 06/22/2017 12:23 PM   Modules accepted: Orders

## 2017-06-22 NOTE — Therapy (Signed)
[place], [place], [address] Phone: [phone]   Fax:  [phone]  Physical Therapy Treatment  Patient Details  Name: Erin Fields MRN: [id number] Date of Birth: [birth year] Referring Provider: Dr. [name]    Encounter Date: 06/22/2017  PT End of Session - 06/22/17 1305    Visit Number  2    Number of Visits  9    Date for PT Re-Evaluation  07/20/17    PT Start Time  0845    PT Stop Time  0930    PT Time Calculation (min)  45 min    Activity Tolerance  Patient tolerated treatment well    Behavior During Therapy  [facility] for tasks assessed/performed       Past Medical History:  Diagnosis Date  . Allergy   . Anxiety   . Asthma   . Cancer [facility])    right breast cancer  . Chicken pox   . Colon polyps   . Depression   . Elevated blood pressure   . Environmental allergies   . Frequent headaches   . GERD (gastroesophageal reflux disease)   . Hay fever   . Hyperlipidemia   . Hypertension   . Hypothyroidism   . Migraines   . PONV (postoperative nausea and vomiting)   . Thyroid disease   . Ulcerative colitis ([place])   . Varicose veins     Past Surgical History:  Procedure Laterality Date  . ABLATION  2011  . BREAST LUMPECTOMY WITH RADIOACTIVE SEED AND SENTINEL LYMPH NODE BIOPSY Right 04/24/2017   Procedure: RIGHT BREAST LUMPECTOMY WITH RADIOACTIVE SEED AND SENTINEL LYMPH NODE BIOPSY;  Surgeon: [name], MD;  Location: [place];  Service: General;  Laterality: Right;  . FOOT SURGERY Right 2012  . HAND SURGERY Right   . RE-EXCISION OF BREAST LUMPECTOMY Right 05/15/2017   Procedure: RE-EXCISION OF BREAST LUMPECTOMY;  Surgeon: [name], MD;  Location: [place];  Service: General;  Laterality: Right;  . Septoplasty with turbinate reduction    . TONSILLECTOMY  1980  . VEIN SURGERY     Removal Right Leg  . WISDOM TOOTH EXTRACTION      There were no vitals  filed for this visit.  Subjective Assessment - 06/22/17 0856    Subjective  Pt has tried the compression pad but now she has a little bubble of fluid on her breast.  she was able to get her simulation and will start radiation on tuesday     Pertinent History  right sided breast cancer diagnosed around 02/15/2017.  Had lumpectomy with 3 nodes removed on1/11/2017 with re-excision on 05/15/2017 and developed swelling after both surgeries and has had 2 aspiration since  Will not have to have chemo but will have radiation.  medical oncologist is Dr. Burr Medico  Past history includes asthma and arthritis in her knee     Patient Stated Goals  to get shoulder back moving again so she can get her radiation                       [facility] Adult PT Treatment/Exercise - 06/22/17 0001      Shoulder Exercises: Sidelying   ABduction  AROM;Right;5 reps deep breath at the top ,verbal and tactile cues    Other Sidelying Exercises  small circles with hand pointed to ceiling       Manual Therapy   Manual Therapy  Edema management;Manual Lymphatic  Drainage (MLD)    Edema Management  Issued klose training DVD for her to practice self MLD at home    Manual Lymphatic Drainage (MLD)  Performed and instructed for pt with hand over hand technique and verbal cues with handout:  short neck, superficial and deep abdominals, right inguinal node, right axillo-inguinal anastamosis, right breast and axilla moving toward abodmen in supine and sidelying                   PT Long Term Goals - 06/15/17 1338      PT LONG TERM GOAL #1   Title  Pt will be independent in lymphedema risk reduction practices    Time  4    Period  Weeks    Status  New      PT LONG TERM GOAL #2   Title  Pt will have > 125 degrees of painful shoulder abduction so that she can received radiation treatment     Time  4    Period  Weeks    Status  New      PT LONG TERM GOAL #3   Title  Pt will be independent in a home exercise program  for ROM and Strength of Upper body     Time  4    Period  Weeks    Status  New      PT LONG TERM GOAL #4   Title  Pt will be indpendent in self manual lymph draiange and use of compression for right breast lymphedema     Time  4    Period  Weeks    Status  New            Plan - 06/22/17 1305    Clinical Impression Statement  pt continues to have fullness in her right breast and was not able to tolerate compression patch.  She has improved range of motion of shoulders and should be able to received radition. If ok with self MLD she should be ready to learn strength ABC soon     Clinical Impairments Affecting Rehab Potential  seroma, 3 nodes removed, will have radiation     PT Frequency  2x / week    PT Duration  4 weeks    PT Treatment/Interventions  ADLs/Self Care Home Management;Taping;Scar mobilization;Passive range of motion;Compression bandaging;Manual lymph drainage;Manual techniques;Patient/family education;Therapeutic exercise;Therapeutic activities    PT Next Visit Plan  Assess self MLD and answer questions  teach Strength ABC program  make sure pt is signed up for ABC class.  may need to put pt on hold during radition and return after it is complete to progress strengthening program     Consulted and Agree with Plan of Care  Patient       Patient will benefit from skilled therapeutic intervention in order to improve the following deficits and impairments:  Decreased knowledge of use of DME, Pain, Postural dysfunction, Decreased range of motion, Decreased strength, Impaired perceived functional ability, Obesity, Increased edema, Decreased knowledge of precautions  Visit Diagnosis: Aftercare following surgery for neoplasm  Localized swelling, mass and lump, trunk  Acute pain of right shoulder  Stiffness of right shoulder, not elsewhere classified     Problem List Patient Active Problem List   Diagnosis Date Noted  . Controlled type 2 diabetes mellitus without  complication, without long-term current use of insulin ([place]) 05/28/2017  . Malignant neoplasm of upper-outer quadrant of right breast in female, estrogen receptor positive ([place]) 03/20/2017  .  Crohn disease ([place]) 11/29/2016  . Borderline diabetes 12/17/2015  . Depression 12/17/2015  . Ex-smoker 11/18/2015  . Chronic venous insufficiency 09/02/2015  . Symptomatic varicose veins, right 09/02/2015  . Eustachian tube dysfunction 12/04/2014  . Allergic rhinitis 09/15/2014  . Dyspnea 09/15/2014  . Liver hemangioma 08/24/2014  . Elevated triglycerides with high cholesterol 11/26/2013  . Insomnia 08/20/2013  . Hypertension 08/20/2013  . Ulcerative colitis, unspecified 04/20/2013  . Oral herpes 04/20/2013  . Hyperlipidemia 12/06/2006  . HEADACHE 12/06/2006  . CERVICAL CANCER, HX OF 12/06/2006  . Hypothyroidism 11/29/2006  . Asthma 11/29/2006  . IRRITABLE BOWEL SYNDROME 11/29/2006   Donato Heinz. Owens Shark PT  Norwood Levo 06/22/2017, 1:08 PM  [place], [place], [address] Phone: [phone]   Fax:  [phone]  Name: Erin Fields MRN: [id number] Date of Birth: 10-Aug-1965

## 2017-06-22 NOTE — Patient Instructions (Signed)
Manual Lymph Drainage for Right Breast.  Do daily.  Do slowly. Use flat hands with just enough pressure to stretch the skin. Do not slide over the skin, but move the skin with the hand you're using. Lie down or sit comfortably (in a recliner, for example) to do this.  1) Hug yourself:  cross arms and do circles at collar bones near neck 5-7 times (to "wake up" lots of lymph nodes in this area). 2) Take slow deep breaths, allowing your belly to balloon out as your breathe in, 5x (to "wake up" abdominal lymph nodes to take on extra fluid). 3) Left armpit--stretch skin in small circles to stimulate intact lymph nodes there, 5-7x. 4) Right groin area, at panty line--stretch skin in small circles to stimulate lymph nodes 5-7x. 5) Redirect fluid from right chest toward left armpit (stretch skin starting at right chest in 3-4 spots working toward left armpit) 3-4x across the chest. 6) Redirect fluid from right armpit toward right groin (cup your hand around the curve of your right side and do 3-4 "pumps" from armpit to groin) 3-4x down your side. 7) Draw an imaginary diagonal line from upper outer breast through the nipple area toward lower inner breast.  Direct fluid upward and inward from this line toward the pathway across your upper chest (established in #5).  Do this in three rows to treat all of the upper inner breast tissue, and do each row 3-4x. 8) Then repeat #5 above. 9) From the imaginary diagonal, direct fluid in three rows (to treat all of lower outer breast tissue) downward and outward toward pathway established in #6 that is aimed at the right groin. 10)  Then repeat #6 above. 11)  End with repeating #3 and #4 above.   [facility] [address] [place], [place]   [address] [phone]  

## 2017-06-26 ENCOUNTER — Encounter: Payer: Self-pay | Admitting: Physical Therapy

## 2017-06-26 ENCOUNTER — Ambulatory Visit
Admission: RE | Admit: 2017-06-26 | Discharge: 2017-06-26 | Disposition: A | Discharge status: Home / Self Care | Payer: BLUE CROSS/BLUE SHIELD | Source: Ambulatory Visit | Attending: Radiation Oncology | Admitting: Radiation Oncology

## 2017-06-26 DIAGNOSIS — Z170 Estrogen receptor positive status [ER+]: Secondary | ICD-10-CM

## 2017-06-26 DIAGNOSIS — C50411 Malignant neoplasm of upper-outer quadrant of right female breast: Secondary | ICD-10-CM | POA: Diagnosis not present

## 2017-06-26 MED ORDER — ALRA NON-METALLIC DEODORANT (RAD-ONC)
1.0000 "application " | Freq: Once | TOPICAL | Status: AC
  Administered 2017-06-26: 1 via TOPICAL

## 2017-06-26 MED ORDER — RADIAPLEXRX EX GEL
Freq: Once | CUTANEOUS | Status: AC
  Administered 2017-06-26: 18:00:00 via TOPICAL

## 2017-06-26 NOTE — Progress Notes (Signed)
Pt here for patient teaching.  Pt given Radiation and You booklet, skin care instructions, [place]ra deodorant and Radiaplex gel.  Reviewed areas of pertinence such as fatigue and skin changes . Pt able to give teach back of to pat skin and use unscented/gentle soap,apply Radiaplex bid, avoid applying anything to skin within 4 hours of treatment, avoid wearing an under wire bra and to use an electric razor if they must shave. Pt demonstrated understanding and verbalizes understanding of information given and will contact nursing with any questions or concerns.

## 2017-06-27 ENCOUNTER — Encounter: Payer: Self-pay | Admitting: Family Medicine

## 2017-06-27 ENCOUNTER — Ambulatory Visit
Admission: RE | Admit: 2017-06-27 | Discharge: 2017-06-27 | Disposition: A | Discharge status: Home / Self Care | Payer: BLUE CROSS/BLUE SHIELD | Source: Ambulatory Visit | Attending: Radiation Oncology | Admitting: Radiation Oncology

## 2017-06-27 DIAGNOSIS — F430 Acute stress reaction: Secondary | ICD-10-CM

## 2017-06-27 DIAGNOSIS — C50411 Malignant neoplasm of upper-outer quadrant of right female breast: Secondary | ICD-10-CM | POA: Diagnosis not present

## 2017-06-27 MED ORDER — DIAZEPAM 2 MG PO TABS: 2.0000 mg | ORAL_TABLET | Freq: Four times a day (QID) | ORAL | 1 refills | Status: DC | PRN

## 2017-06-28 ENCOUNTER — Ambulatory Visit
Admission: RE | Admit: 2017-06-28 | Discharge: 2017-06-28 | Disposition: A | Discharge status: Home / Self Care | Payer: BLUE CROSS/BLUE SHIELD | Source: Ambulatory Visit | Attending: Radiation Oncology | Admitting: Radiation Oncology

## 2017-06-28 DIAGNOSIS — C50411 Malignant neoplasm of upper-outer quadrant of right female breast: Secondary | ICD-10-CM | POA: Diagnosis not present

## 2017-06-29 ENCOUNTER — Ambulatory Visit: Payer: BLUE CROSS/BLUE SHIELD | Admitting: Medical

## 2017-06-29 ENCOUNTER — Ambulatory Visit
Admission: RE | Admit: 2017-06-29 | Discharge: 2017-06-29 | Disposition: A | Discharge status: Home / Self Care | Payer: BLUE CROSS/BLUE SHIELD | Source: Ambulatory Visit | Attending: Radiation Oncology | Admitting: Radiation Oncology

## 2017-06-29 ENCOUNTER — Encounter: Payer: Self-pay | Admitting: Medical

## 2017-06-29 VITALS — BP 125/81 | HR 107 | Temp 98.4°F | Resp 16 | Ht 63.0 in | Wt 180.6 lb

## 2017-06-29 DIAGNOSIS — R0981 Nasal congestion: Secondary | ICD-10-CM | POA: Diagnosis not present

## 2017-06-29 DIAGNOSIS — J0100 Acute maxillary sinusitis, unspecified: Secondary | ICD-10-CM

## 2017-06-29 DIAGNOSIS — C50411 Malignant neoplasm of upper-outer quadrant of right female breast: Secondary | ICD-10-CM | POA: Diagnosis not present

## 2017-06-29 MED ORDER — PREDNISONE 10 MG PO TABS: ORAL_TABLET | ORAL | 0 refills | Status: DC

## 2017-06-29 MED ORDER — LEVOFLOXACIN 500 MG PO TABS: 500.0000 mg | ORAL_TABLET | Freq: Every day | ORAL | 0 refills | Status: DC

## 2017-06-29 NOTE — Progress Notes (Signed)
Subjective:    Patient ID: Erin Fields, female    DOB: 1966-03-16, 52 y.o.   MRN: [id number]  HPI  Pt in with some 2 months of nasal congestion. She is getting on and off colored mucus over past 2 months. This week left side sinus pressure pain. Pt has been using otc med but not getting better. Also states some left upper teeth pain.  Pt already does nasocort and astelin.  Pt does get occasional sinus infection in the past.(last sinus infection she had was 2 weeks ago) She was placed on augmentin. Also she was given taper dose of prednisone. Pt states her sinus feel like never got better from treatment 2 weeks ago.  Pt had recent surgery for breast cancer. She is getting radiation treatment just recently.  Pt told when taking radiation not to take doxycycline.     Review of Systems  Constitutional: Negative for chills, fatigue and fever.  HENT: Positive for congestion, sinus pressure and sinus pain. Negative for ear pain, nosebleeds, postnasal drip, sore throat, trouble swallowing and voice change.   Respiratory: Negative for chest tightness, shortness of breath and wheezing.   Cardiovascular: Negative for chest pain and palpitations.  Musculoskeletal: Negative for back pain.  Skin: Negative for rash.  Neurological: Negative for dizziness and headaches.  Hematological: Negative for adenopathy. Does not bruise/bleed easily.  Psychiatric/Behavioral: Negative for behavioral problems and confusion.    Past Medical History:  Diagnosis Date  . Allergy   . Anxiety   . Asthma   . Cancer [facility])    right breast cancer  . Chicken pox   . Colon polyps   . Depression   . Elevated blood pressure   . Environmental allergies   . Frequent headaches   . GERD (gastroesophageal reflux disease)   . Hay fever   . Hyperlipidemia   . Hypertension   . Hypothyroidism   . Migraines   . PONV (postoperative nausea and vomiting)   . Thyroid disease   . Ulcerative colitis ([place])   . Varicose  veins      Social History   Socioeconomic History  . Marital status: Married    Spouse name: Not on file  . Number of children: Not on file  . Years of education: Not on file  . Highest education level: Not on file  Social Needs  . Financial resource strain: Not on file  . Food insecurity - worry: Not on file  . Food insecurity - inability: Not on file  . Transportation needs - medical: Not on file  . Transportation needs - non-medical: Not on file  Occupational History  . Not on file  Tobacco Use  . Smoking status: Former Smoker    Packs/day: 4.00    Years: 15.00    Pack years: 60.00    Types: Cigarettes    Last attempt to quit: 09/14/1993    Years since quitting: 23.8  . Smokeless tobacco: Never Used  . Tobacco comment: Quit >20 yrs ago  Substance and Sexual Activity  . Alcohol use: Yes    Comment: Drinks a couple every 2-3 months  . Drug use: No  . Sexual activity: No    Comment: ablation  Other Topics Concern  . Not on file  Social History Narrative  . Not on file    Past Surgical History:  Procedure Laterality Date  . ABLATION  2011  . BREAST LUMPECTOMY WITH RADIOACTIVE SEED AND SENTINEL LYMPH NODE BIOPSY Right 04/24/2017  Procedure: RIGHT BREAST LUMPECTOMY WITH RADIOACTIVE SEED AND SENTINEL LYMPH NODE BIOPSY;  Surgeon: [name], MD;  Location: [place];  Service: General;  Laterality: Right;  . FOOT SURGERY Right 2012  . HAND SURGERY Right   . RE-EXCISION OF BREAST LUMPECTOMY Right 05/15/2017   Procedure: RE-EXCISION OF BREAST LUMPECTOMY;  Surgeon: [name], MD;  Location: [place];  Service: General;  Laterality: Right;  . Septoplasty with turbinate reduction    . TONSILLECTOMY  1980  . VEIN SURGERY     Removal Right Leg  . WISDOM TOOTH EXTRACTION      Family History  Problem Relation Age of Onset  . Arthritis Mother 34       Deceased  . Breast cancer Mother   . Lung cancer Mother   . Hyperlipidemia Mother    . Heart disease Mother   . Hypertension Mother   . Diabetes Mother   . Crohn's disease Mother   . Diabetes Maternal Grandfather   . Heart disease Maternal Grandfather   . Heart attack Maternal Grandfather   . Colitis Sister     Allergies  Allergen Reactions  . Ace Inhibitors Other (See Comments)    Angina  . Aspirin   . Cefaclor   . Lisinopril Cough  . Losartan Potassium   . Oxycodone Hives  . Sulfonamide Derivatives     Current Outpatient Medications on File Prior to Visit  Medication Sig Dispense Refill  . acyclovir (ZOVIRAX) 400 MG tablet Take 1 tablet (400 mg total) by mouth 3 (three) times daily as needed. 30 tablet 5  . azelastine (ASTELIN) 0.1 % nasal spray 2 sprays 2 (two) times daily.  6  . budesonide-formoterol (SYMBICORT) 160-4.5 MCG/ACT inhaler Inhale 2 puffs into the lungs 2 (two) times daily.    . chlorpheniramine-HYDROcodone (TUSSIONEX PENNKINETIC ER) 10-8 MG/5ML SUER Take 5 mLs by mouth every 12 (twelve) hours as needed for cough (cough). 140 mL 0  . diazepam (VALIUM) 2 MG tablet Take 1 tablet (2 mg total) by mouth every 6 (six) hours as needed for anxiety. Do not combine with ambien 30 tablet 1  . EPINEPHrine 0.3 mg/0.3 mL IJ SOAJ injection epinephrine 0.3 mg/0.3 mL injection, auto-injector  TK UTD PRN    . hyaluronate sodium (RADIAPLEXRX) GEL Apply 1 application topically 2 (two) times daily.    . hyoscyamine (ANASPAZ) 0.125 MG TBDP disintergrating tablet hyoscyamine 0.125 mg disintegrating tablet    . lansoprazole (PREVACID) 30 MG capsule Take 30 mg by mouth daily at 12 noon.    . levalbuterol (XOPENEX HFA) 45 MCG/ACT inhaler Inhale 2 puffs into the lungs every 4 (four) hours as needed for wheezing (Q4-6 Hours PRN).    Marland Kitchen levocetirizine (XYZAL) 5 MG tablet levocetirizine 5 mg tablet  TK 1 T PO QD IN THE EVE    . levothyroxine (SYNTHROID, LEVOTHROID) 88 MCG tablet Take 1 tablet (88 mcg total) by mouth daily. 90 tablet 3  . mesalamine (LIALDA) 1.2 G EC tablet  Take 2.4 g by mouth daily.    . montelukast (SINGULAIR) 10 MG tablet Take 10 mg by mouth at bedtime.     . non-metallic deodorant Jethro Poling) MISC Apply 1 application topically daily as needed.    Marland Kitchen olmesartan (BENICAR) 40 MG tablet Take 1 tablet daily 90 tablet 3  . promethazine (PHENERGAN) 25 MG tablet TAKE 1/2-1 TABLET DAILY AS NEEDED FOR NAUSEA 30 tablet 0  . rosuvastatin (CRESTOR) 10 MG tablet Take 1 tablet (10  mg total) by mouth daily. 30 tablet 11  . triamcinolone (NASACORT ALLERGY 24HR) 55 MCG/ACT AERO nasal inhaler Place 1 spray into the nose daily.    Marland Kitchen venlafaxine XR (EFFEXOR-XR) 150 MG 24 hr capsule TAKE 1 BY MOUTH EVERY DAY WITH BREAKFAST 90 capsule 3  . zolpidem (AMBIEN) 5 MG tablet TAKE 1 TABLET BY MOUTH EVERY DAY AT BEDTIME AS NEEDED. Do not combine with any other sedating medication 30 tablet 1   No current facility-administered medications on file prior to visit.     BP 125/81 (BP Location: Left Arm, Patient Position: Sitting, Cuff Size: Normal)   Pulse (!) 107   Temp 98.4 F (36.9 C) (Oral)   Resp 16   Ht 5' 3"  (1.6 m)   Wt 180 lb 9.6 oz (81.9 kg)   SpO2 97%   BMI 31.99 kg/m       Objective:   Physical Exam  General  Mental Status - Alert. General Appearance - Well groomed. Not in acute distress.  Skin Rashes- No Rashes.  HEENT Head- Normal. Ear Auditory Canal - Left- Normal. Right - Normal.Tympanic Membrane- Left- Normal. Right- Normal. Eye Sclera/Conjunctiva- Left- Normal. Right- Normal. Nose & Sinuses Nasal Mucosa- Left-  Boggy and Congested. Right-  Boggy and  Congested.Bilateral left side  maxillary and  Left sidefrontal sinus pressure. Mouth & Throat Lips: Upper Lip- Normal: no dryness, cracking, pallor, cyanosis, or vesicular eruption. Lower Lip-Normal: no dryness, cracking, pallor, cyanosis or vesicular eruption. Buccal Mucosa- Bilateral- No Aphthous ulcers. Oropharynx- No Discharge or Erythema. Tonsils: Characteristics- Bilateral- No Erythema or  Congestion. Size/Enlargement- Bilateral- No enlargement. Discharge- bilateral-None.  Neck Neck- Supple. No Masses.   Chest and Lung Exam Auscultation: Breath Sounds:-Clear even and unlabored.  Cardiovascular Auscultation:Rythm- Regular, rate and rhythm. Murmurs & Other Heart Sounds:Ausculatation of the heart reveal- No Murmurs.  Lymphatic Head & Neck General Head & Neck Lymphatics: Bilateral: Description- No Localized lymphadenopathy.       Assessment & Plan:  For your recent history of probable persisting sinus infection despite use of Augmentin, I am prescribing levofloxacin antibiotic.  Continue both your nasal sprays.  I expect that she would gradually improve with a stronger antibiotic.  If not then would need to consider referral to ENT.     Also I am giving you a printed prescription of prednisone.  If your severe nasal congestion persist despite the Levaquin then I would like for you to get opinion from your oncologist if you can use prednisone presently during the treatment regimen.    Follow-up in 7-10 days or as needed.   Mackie Pai, PA-C

## 2017-06-29 NOTE — Patient Instructions (Signed)
For your recent history of probable persisting sinus infection despite use of Augmentin, I am prescribing levofloxacin antibiotic.  Continue both your nasal sprays.  I expect that she would gradually improve with a stronger antibiotic.  If not then would need to consider referral to ENT.     Also I am giving you a printed prescription of prednisone.  If your severe nasal congestion persist despite the Levaquin then I would like for you to get opinion from your oncologist if you can use prednisone presently during the treatment regimen.    Follow-up in 7-10 days or as needed.

## 2017-07-02 ENCOUNTER — Ambulatory Visit
Admission: RE | Admit: 2017-07-02 | Discharge: 2017-07-02 | Disposition: A | Discharge status: Home / Self Care | Payer: BLUE CROSS/BLUE SHIELD | Source: Ambulatory Visit | Attending: Radiation Oncology | Admitting: Radiation Oncology

## 2017-07-02 DIAGNOSIS — C50411 Malignant neoplasm of upper-outer quadrant of right female breast: Secondary | ICD-10-CM | POA: Diagnosis not present

## 2017-07-03 ENCOUNTER — Ambulatory Visit
Admission: RE | Admit: 2017-07-03 | Discharge: 2017-07-03 | Disposition: A | Discharge status: Home / Self Care | Payer: BLUE CROSS/BLUE SHIELD | Source: Ambulatory Visit | Attending: Radiation Oncology | Admitting: Radiation Oncology

## 2017-07-03 DIAGNOSIS — C50411 Malignant neoplasm of upper-outer quadrant of right female breast: Secondary | ICD-10-CM | POA: Diagnosis not present

## 2017-07-04 ENCOUNTER — Inpatient Hospital Stay: Payer: BLUE CROSS/BLUE SHIELD

## 2017-07-04 ENCOUNTER — Encounter: Payer: Self-pay | Admitting: Genetic Counselor

## 2017-07-04 ENCOUNTER — Ambulatory Visit
Admission: RE | Admit: 2017-07-04 | Discharge: 2017-07-04 | Disposition: A | Discharge status: Home / Self Care | Payer: BLUE CROSS/BLUE SHIELD | Source: Ambulatory Visit | Attending: Radiation Oncology | Admitting: Radiation Oncology

## 2017-07-04 ENCOUNTER — Inpatient Hospital Stay: Payer: BLUE CROSS/BLUE SHIELD | Attending: Genetic Counselor | Admitting: Genetic Counselor

## 2017-07-04 DIAGNOSIS — Z809 Family history of malignant neoplasm, unspecified: Secondary | ICD-10-CM

## 2017-07-04 DIAGNOSIS — C50411 Malignant neoplasm of upper-outer quadrant of right female breast: Secondary | ICD-10-CM

## 2017-07-04 DIAGNOSIS — Z170 Estrogen receptor positive status [ER+]: Secondary | ICD-10-CM

## 2017-07-04 DIAGNOSIS — Z803 Family history of malignant neoplasm of breast: Secondary | ICD-10-CM | POA: Insufficient documentation

## 2017-07-04 NOTE — Progress Notes (Signed)
REFERRING PROVIDER: [name], MD [address] [place], [place] [address]  PRIMARY PROVIDER:  Darreld Mclean, MD  PRIMARY REASON FOR VISIT:  1. Malignant neoplasm of upper-outer quadrant of right breast in female, estrogen receptor positive ([place])   2. Family history of cancer      HISTORY OF PRESENT ILLNESS:   Erin Fields, a 52 y.o. female, was seen for a [facility] cancer genetics consultation at the request of Dr. Burr Medico due to a personal and family history of cancer.  Erin Fields presents to clinic today to discuss the possibility of a hereditary predisposition to cancer, genetic testing, and to further clarify her future cancer risks, as well as potential cancer risks for family members.   In 2019, at the age of 36, Erin Fields was diagnosed with invasive ductal carcinoma of the right breast. This was treated with lumpectomy and she is currently undergoing radiation.  The tumor is ER+/PR+/Her2-.      CANCER HISTORY:  Oncology History   Cancer Staging Malignant neoplasm of upper-outer quadrant of right breast in female, estrogen receptor positive ([place]) Staging form: Breast, AJCC 8th Edition - Clinical stage from 03/16/2017: Stage IA (cT1b, cN0, cM0, G2, ER: Positive, PR: Positive, HER2: Negative) - Signed by [name], MD on 03/28/2017 - Pathologic: No stage assigned - Unsigned       Malignant neoplasm of upper-outer quadrant of right breast in female, estrogen receptor positive ([place])   03/15/2017 Mammogram    Diagnostic Mammogram Right Breast  IMPRESSION: Palpable abnormality corresponds to distortion and focal mass associated acoustic shadowing measuring 0.8x0.9x0.9cm in the 930 o'clock location of the right breast 7 cm from the nipple.   RECOMMENDATION: Ultrasound-guided core biopsy is recommended and scheduled for the patient.      03/16/2017 Initial Biopsy    Biopsy Results  Diagnosis 03/16/17 Breast, right, needle core biopsy, 9:30 o'clock, ribbon clip  placed - INVASIVE MAMMARY CARCINOMA. - SEE COMMENT.       03/16/2017 Receptors her2    Estrogen Receptor: 95%, POSITIVE, STRONG STAINING INTENSITY Progesterone Receptor: 95%, POSITIVE, STRONG STAINING INTENSITY Proliferation Marker Ki67: 3% HER2: NEGATIVE      03/20/2017 Initial Diagnosis    Malignant neoplasm of upper-outer quadrant of right breast in female, estrogen receptor positive ([place])        HORMONAL RISK FACTORS:  Menarche was at age 23.  First live birth at age 44.  OCP use for approximately 13 years.  Ovaries intact: yes.  Hysterectomy: no.  Menopausal status: postmenopausal.  HRT use: < 1 years. Colonoscopy: yes; ulcerative colitis, 2 polyps. Mammogram within the last year: yes. Number of breast biopsies: 1. Up to date with pelvic exams:  yes. Any excessive radiation exposure in the past:  no  Past Medical History:  Diagnosis Date  . Allergy   . Anxiety   . Asthma   . Cancer [facility])    right breast cancer  . Chicken pox   . Colon polyps   . Depression   . Elevated blood pressure   . Environmental allergies   . Family history of cancer   . Frequent headaches   . GERD (gastroesophageal reflux disease)   . Hay fever   . Hyperlipidemia   . Hypertension   . Hypothyroidism   . Migraines   . PONV (postoperative nausea and vomiting)   . Thyroid disease   . Ulcerative colitis ([place])   . Varicose veins     Past Surgical History:  Procedure Laterality  Date  . ABLATION  2011  . BREAST LUMPECTOMY WITH RADIOACTIVE SEED AND SENTINEL LYMPH NODE BIOPSY Right 04/24/2017   Procedure: RIGHT BREAST LUMPECTOMY WITH RADIOACTIVE SEED AND SENTINEL LYMPH NODE BIOPSY;  Surgeon: [name], MD;  Location: [place];  Service: General;  Laterality: Right;  . FOOT SURGERY Right 2012  . HAND SURGERY Right   . RE-EXCISION OF BREAST LUMPECTOMY Right 05/15/2017   Procedure: RE-EXCISION OF BREAST LUMPECTOMY;  Surgeon: [name], MD;  Location: [place];  Service: General;  Laterality: Right;  . Septoplasty with turbinate reduction    . TONSILLECTOMY  1980  . VEIN SURGERY     Removal Right Leg  . WISDOM TOOTH EXTRACTION      Social History   Socioeconomic History  . Marital status: Married    Spouse name: Not on file  . Number of children: Not on file  . Years of education: Not on file  . Highest education level: Not on file  Social Needs  . Financial resource strain: Not on file  . Food insecurity - worry: Not on file  . Food insecurity - inability: Not on file  . Transportation needs - medical: Not on file  . Transportation needs - non-medical: Not on file  Occupational History  . Not on file  Tobacco Use  . Smoking status: Former Smoker    Packs/day: 4.00    Years: 15.00    Pack years: 60.00    Types: Cigarettes    Last attempt to quit: 09/14/1993    Years since quitting: 23.8  . Smokeless tobacco: Never Used  . Tobacco comment: Quit >20 yrs ago  Substance and Sexual Activity  . Alcohol use: Yes    Comment: Drinks a couple every 2-3 months  . Drug use: No  . Sexual activity: No    Comment: ablation  Other Topics Concern  . Not on file  Social History Narrative  . Not on file     FAMILY HISTORY:  We obtained a detailed, 4-generation family history.  Significant diagnoses are listed below: Family History  Problem Relation Age of Onset  . Arthritis Mother 78       Deceased  . Breast cancer Mother   . Lung cancer Mother   . Hyperlipidemia Mother   . Heart disease Mother   . Hypertension Mother   . Diabetes Mother   . Crohn's disease Mother   . Diabetes Maternal Grandfather   . Heart disease Maternal Grandfather   . Heart attack Maternal Grandfather   . Leukemia Maternal Grandfather   . Colitis Sister   . Leukemia Cousin 3       mat cousin    The patient has a son and a daughter (deceased) who were cancer free.  She has a maternal half sister who is cancer free.  Her mother died of  metastatic cancer, unsure where the primary was, but she did have breast cancer.  The father was never known.  The patient's mother had one sister and a paternal half brother.  The full sister is deceased from unknown causes.  The brother is living and healthy.  He has a daughter who had leukemia at 3.  The grandfather died of leukemia.    Erin Fields is unaware of previous family history of genetic testing for hereditary cancer risks. Patient's maternal ancestors are of Caucasian descent, and paternal ancestors are of Caucasian descent. There is no reported Ashkenazi Jewish ancestry. There is  no known consanguinity.  GENETIC COUNSELING ASSESSMENT: Erin Fields is a 52 y.o. female with a personal and family history of breast cancer which is somewhat suggestive of a hereditary cancer syndrome and predisposition to cancer. We, therefore, discussed and recommended the following at today's visit.   DISCUSSION: We discussed that only 5-10% of breast cancer is hereditary with most cases due to BRCA mutations.  There are other genes that are associated with hereditary cancer syndromes, including ATM, CHEK2 and PALB2.  We discussed with Erin Fields that the family history is not highly consistent with a familial hereditary cancer syndrome, and we feel she is at low risk to harbor a gene mutation associated with such a condition.  We discussed testing options such as no testing, as well as paying out of pocket for testing.  The laboratory has a patient pay for $250.  The patient agreed to that and we ordered testing for the multi-cancer panel.  The Multi-Gene Panel offered by [name] includes sequencing and/or deletion duplication testing of the following 83 genes: ALK, APC, ATM, AXIN2,BAP1,  BARD1, BLM, BMPR1A, BRCA1, BRCA2, BRIP1, CASR, CDC73, CDH1, CDK4, CDKN1B, CDKN1C, CDKN2A (p14ARF), CDKN2A (p16INK4a), CEBPA, CHEK2, CTNNA1, DICER1, DIS3L2, EGFR (c.2369C>T, p.Thr790Met variant only), EPCAM (Deletion/duplication  testing only), FH, FLCN, GATA2, GPC3, GREM1 (Promoter region deletion/duplication testing only), HOXB13 (c.251G>A, p.Gly84Glu), HRAS, KIT, MAX, MEN1, MET, MITF (c.952G>A, p.Glu318Lys variant only), MLH1, MSH2, MSH3, MSH6, MUTYH, NBN, NF1, NF2, NTHL1, PALB2, PDGFRA, PHOX2B, PMS2, POLD1, POLE, POT1, PRKAR1A, PTCH1, PTEN, RAD50, RAD51C, RAD51D, RB1, RECQL4, RET, RUNX1, SDHAF2, SDHA (sequence changes only), SDHB, SDHC, SDHD, SMAD4, SMARCA4, SMARCB1, SMARCE1, STK11, SUFU, TERT, TERT, TMEM127, TP53, TSC1, TSC2, VHL, WRN and WT1.    PLAN: After considering the risks, benefits, and limitations, Erin Fields  provided informed consent to pursue genetic testing and the blood sample was sent to [facility] for analysis of the Multi-cancer panel. Results should be available within approximately 2-3 weeks' time, at which point they will be disclosed by telephone to Erin Fields, as will any additional recommendations warranted by these results. Erin Fields will receive a summary of her genetic counseling visit and a copy of her results once available. This information will also be available in Epic. We encouraged Erin Fields to remain in contact with cancer genetics annually so that we can continuously update the family history and inform her of any changes in cancer genetics and testing that may be of benefit for her family. Erin Fields questions were answered to her satisfaction today. Our contact information was provided should additional questions or concerns arise.  Lastly, we encouraged Erin Fields to remain in contact with cancer genetics annually so that we can continuously update the family history and inform her of any changes in cancer genetics and testing that may be of benefit for this family.   Ms.  Fields questions were answered to her satisfaction today. Our contact information was provided should additional questions or concerns arise. Thank you for the referral and allowing [place] to share in the care of your patient.    Vandy Tsuchiya P. Florene Glen, [place], [facility] Certified Genetic Counselor [email or web address] phone: [phone]  The patient was seen for a total of 30 minutes in face-to-face genetic counseling.  This patient was discussed with Drs. Magrinat, Lindi Adie and/or Burr Medico who agrees with the above.    _______________________________________________________________________ For Office Staff:  Number of people involved in session: 1 Was an Intern/ student involved with case: no

## 2017-07-05 ENCOUNTER — Ambulatory Visit
Admission: RE | Admit: 2017-07-05 | Discharge: 2017-07-05 | Disposition: A | Discharge status: Home / Self Care | Payer: BLUE CROSS/BLUE SHIELD | Source: Ambulatory Visit | Attending: Radiation Oncology | Admitting: Radiation Oncology

## 2017-07-05 DIAGNOSIS — C50411 Malignant neoplasm of upper-outer quadrant of right female breast: Secondary | ICD-10-CM | POA: Diagnosis not present

## 2017-07-06 ENCOUNTER — Ambulatory Visit
Admission: RE | Admit: 2017-07-06 | Discharge: 2017-07-06 | Disposition: A | Discharge status: Home / Self Care | Payer: BLUE CROSS/BLUE SHIELD | Source: Ambulatory Visit | Attending: Radiation Oncology | Admitting: Radiation Oncology

## 2017-07-06 DIAGNOSIS — C50411 Malignant neoplasm of upper-outer quadrant of right female breast: Secondary | ICD-10-CM | POA: Diagnosis not present

## 2017-07-09 ENCOUNTER — Encounter: Payer: Self-pay | Admitting: Rehabilitation

## 2017-07-09 ENCOUNTER — Ambulatory Visit
Admission: RE | Admit: 2017-07-09 | Discharge: 2017-07-09 | Disposition: A | Discharge status: Home / Self Care | Payer: BLUE CROSS/BLUE SHIELD | Source: Ambulatory Visit | Attending: Radiation Oncology | Admitting: Radiation Oncology

## 2017-07-09 ENCOUNTER — Ambulatory Visit: Payer: BLUE CROSS/BLUE SHIELD | Admitting: Rehabilitation

## 2017-07-09 DIAGNOSIS — Z483 Aftercare following surgery for neoplasm: Secondary | ICD-10-CM

## 2017-07-09 DIAGNOSIS — C50411 Malignant neoplasm of upper-outer quadrant of right female breast: Secondary | ICD-10-CM | POA: Diagnosis not present

## 2017-07-09 DIAGNOSIS — R222 Localized swelling, mass and lump, trunk: Secondary | ICD-10-CM

## 2017-07-09 NOTE — Therapy (Signed)
[place], [place], [address] Phone: [phone]   Fax:  [phone]  Physical Therapy Treatment  Patient Details  Name: Erin Fields MRN: [id number] Date of Birth: June 24, 1965 Referring Provider: Dr. [name]    Encounter Date: 07/09/2017  PT End of Session - 07/09/17 1702    Visit Number  3    Number of Visits  9    Date for PT Re-Evaluation  07/20/17    PT Start Time  7371    PT Stop Time  1600    PT Time Calculation (min)  43 min    Activity Tolerance  Patient tolerated treatment well    Behavior During Therapy  [facility] for tasks assessed/performed       Past Medical History:  Diagnosis Date  . Allergy   . Anxiety   . Asthma   . Cancer [facility])    right breast cancer  . Chicken pox   . Colon polyps   . Depression   . Elevated blood pressure   . Environmental allergies   . Family history of cancer   . Frequent headaches   . GERD (gastroesophageal reflux disease)   . Hay fever   . Hyperlipidemia   . Hypertension   . Hypothyroidism   . Migraines   . PONV (postoperative nausea and vomiting)   . Thyroid disease   . Ulcerative colitis ([place])   . Varicose veins     Past Surgical History:  Procedure Laterality Date  . ABLATION  2011  . BREAST LUMPECTOMY WITH RADIOACTIVE SEED AND SENTINEL LYMPH NODE BIOPSY Right 04/24/2017   Procedure: RIGHT BREAST LUMPECTOMY WITH RADIOACTIVE SEED AND SENTINEL LYMPH NODE BIOPSY;  Surgeon: [name], MD;  Location: [place];  Service: General;  Laterality: Right;  . FOOT SURGERY Right 2012  . HAND SURGERY Right   . RE-EXCISION OF BREAST LUMPECTOMY Right 05/15/2017   Procedure: RE-EXCISION OF BREAST LUMPECTOMY;  Surgeon: [name], MD;  Location: [place];  Service: General;  Laterality: Right;  . Septoplasty with turbinate reduction    . TONSILLECTOMY  1980  . VEIN SURGERY     Removal Right Leg  . WISDOM TOOTH EXTRACTION       There were no vitals filed for this visit.  Subjective Assessment - 07/09/17 1523    Subjective  Pt returns with complaints of lateral trunk swelling and trouble doing self MLD due to new skin sensitivity due to radiation.  Minor skin changes now due to radiation in the R breast and axilla so now it is difficult to touch the breast and trunk.  Pt also reports the MLD video did not work for her at home.        Pertinent History  right sided breast cancer diagnosed around 02/15/2017.  Had lumpectomy with 3 nodes removed on1/11/2017 with re-excision on 05/15/2017 and developed swelling after both surgeries and has had 2 aspiration since  Will not have to have chemo but will have radiation.  medical oncologist is Dr. Burr Medico  Past history includes asthma and arthritis in her knee     Pain Score  5     Pain Location  Axilla    Pain Orientation  Right    Pain Descriptors / Indicators  Burning    Pain Type  Acute pain    Pain Onset  More than a month ago    Aggravating Factors   moving it    Pain  Relieving Factors  heat and ice                No data recorded       [facility] Adult PT Treatment/Exercise - 07/09/17 0001      Manual Therapy   Edema Management  gave paper upper UE MLD handout to include up to where UE begins.      Manual Lymphatic Drainage (MLD)  Supine short neck, deep abdmonimal and abdmonimal resisted breathing, L axillary nodes, interaxillary anastamosis, R inguinal nodes, R axillo-inguinal anastamosis, Lt sidelying work posterior interaxillary and axillo-inguinal with focus on scapular region and lateral trunk.  Avoiding irradiated regions             PT Education - 07/09/17 1623    Education provided  Yes    Education Details  self MLD handout, how to include interaxillary pathway if axillo-inguinal pathway is too sensitive    Person(s) Educated  Patient    Methods  Explanation;Demonstration;Handout    Comprehension  Verbalized understanding;Need further  instruction          PT Long Term Goals - 07/09/17 1705      PT LONG TERM GOAL #1   Title  Pt will be independent in lymphedema risk reduction practices    Status  On-going      PT LONG TERM GOAL #2   Title  Pt will have > 125 degrees of painful shoulder abduction so that she can received radiation treatment     Status  On-going      PT LONG TERM GOAL #3   Title  Pt will be independent in a home exercise program for ROM and Strength of Upper body     Status  On-going      PT LONG TERM GOAL #4   Title  Pt will be indpendent in self manual lymph draiange and use of compression for right breast lymphedema     Status  On-going            Plan - 07/09/17 1703    Clinical Impression Statement  Pt returns today after beginning radiation therapy with continued complaints of R lateral trunk and axillary/breast fullness.  She also reports increased sensitivity to the irradiated region to touch for self MLD and that the [place] video did not work.  Focused today on MLD to tolerance    Clinical Impairments Affecting Rehab Potential  seroma, 3 nodes removed, will have radiation     PT Frequency  2x / week    PT Duration  4 weeks    PT Treatment/Interventions  ADLs/Self Care Home Management;Taping;Scar mobilization;Passive range of motion;Compression bandaging;Manual lymph drainage;Manual techniques;Patient/family education;Therapeutic exercise;Therapeutic activities    PT Next Visit Plan  review self MLD paper handout, continue MLD/sef MLD, ABC strength program       Patient will benefit from skilled therapeutic intervention in order to improve the following deficits and impairments:  Decreased knowledge of use of DME, Pain, Postural dysfunction, Decreased range of motion, Decreased strength, Impaired perceived functional ability, Obesity, Increased edema, Decreased knowledge of precautions  Visit Diagnosis: Aftercare following surgery for neoplasm  Localized swelling, mass and lump,  trunk     Problem List Patient Active Problem List   Diagnosis Date Noted  . Family history of cancer   . Controlled type 2 diabetes mellitus without complication, without long-term current use of insulin ([place]) 05/28/2017  . Malignant neoplasm of upper-outer quadrant of right breast in female, estrogen receptor positive ([place]) 03/20/2017  .  Crohn disease ([place]) 11/29/2016  . Borderline diabetes 12/17/2015  . Depression 12/17/2015  . Ex-smoker 11/18/2015  . Chronic venous insufficiency 09/02/2015  . Symptomatic varicose veins, right 09/02/2015  . Eustachian tube dysfunction 12/04/2014  . Allergic rhinitis 09/15/2014  . Dyspnea 09/15/2014  . Liver hemangioma 08/24/2014  . Elevated triglycerides with high cholesterol 11/26/2013  . Insomnia 08/20/2013  . Hypertension 08/20/2013  . Ulcerative colitis, unspecified 04/20/2013  . Oral herpes 04/20/2013  . Hyperlipidemia 12/06/2006  . HEADACHE 12/06/2006  . CERVICAL CANCER, HX OF 12/06/2006  . Hypothyroidism 11/29/2006  . Asthma 11/29/2006  . IRRITABLE BOWEL SYNDROME 11/29/2006    Shan Levans, PT  07/09/2017, 5:06 PM  [place], [place], [address] Phone: [phone]   Fax:  [phone]  Name: Erin Fields MRN: [id number] Date of Birth: July 10, 1965

## 2017-07-09 NOTE — Patient Instructions (Addendum)
Gave patient paper self UE MLD handout due to video not working

## 2017-07-10 ENCOUNTER — Ambulatory Visit
Admission: RE | Admit: 2017-07-10 | Discharge: 2017-07-10 | Disposition: A | Discharge status: Home / Self Care | Payer: BLUE CROSS/BLUE SHIELD | Source: Ambulatory Visit | Attending: Radiation Oncology | Admitting: Radiation Oncology

## 2017-07-10 DIAGNOSIS — C50411 Malignant neoplasm of upper-outer quadrant of right female breast: Secondary | ICD-10-CM | POA: Diagnosis not present

## 2017-07-11 ENCOUNTER — Ambulatory Visit
Admission: RE | Admit: 2017-07-11 | Discharge: 2017-07-11 | Disposition: A | Discharge status: Home / Self Care | Payer: BLUE CROSS/BLUE SHIELD | Source: Ambulatory Visit | Attending: Radiation Oncology | Admitting: Radiation Oncology

## 2017-07-11 ENCOUNTER — Ambulatory Visit: Payer: BLUE CROSS/BLUE SHIELD | Admitting: Physical Therapy

## 2017-07-11 DIAGNOSIS — Z483 Aftercare following surgery for neoplasm: Secondary | ICD-10-CM

## 2017-07-11 DIAGNOSIS — C50411 Malignant neoplasm of upper-outer quadrant of right female breast: Secondary | ICD-10-CM | POA: Diagnosis not present

## 2017-07-11 DIAGNOSIS — R222 Localized swelling, mass and lump, trunk: Secondary | ICD-10-CM

## 2017-07-11 NOTE — Patient Instructions (Addendum)
Self manual lymph drainage: Perform this sequence once a day.  Only give enough pressure no your skin to make the skin move.  Diaphragmatic - Supine   Inhale through nose making navel move out toward hands. Exhale through puckered lips, hands follow navel in. Repeat _5__ times. Rest _10__ seconds between repeats.   Copyright  VHI. All rights reserved.  Hug yourself.  Do circles at your neck just above your collarbones.  Repeat this 10 times.  Axilla - One at a Time   Using full weight of flat hand and fingers at center of uninvolved armpit, make _10__ in-place circles.   Copyright  VHI. All rights reserved.  LEG: Inguinal Nodes Stimulation   With small finger side of hand against hip crease on involved side, gently perform circles at the crease. Repeat __10_ times.   Copyright  VHI. All rights reserved.  1) Axilla to Inguinal Nodes - Pump   On involved side, pump _4__ times from armpit along side of trunk to hip crease.  Now gently stretch skin from the involved side to the uninvolved side across the chest at the shoulder line.  Repeat that 4 times.   Finish by doing the pathways as described above going from your involved armpit to the same side groin.  Repeat the steps above where you do circles in your left groin and right armpit. Copyright  VHI. All rights reserved.     ABOUT KINESIOTAPE:  Kinesiotape should stay on your body for a few days.  It's OK to shower with it on.  Just pat it dry afterwards.    While the tape is on, keep an eye on the skin around it.  If you feel any itching, see any redness or feel in any way that the tape is irritating your skin, it is time to gently remove it.   To loosen the tape from your skin, use something oily like olive oil or baby oil on a cotton ball.  Gently roll the edge of the tape away from the skin.  Then, hold the edge of the tape away from the skin and gently press the skin away from the tape.  Do not pull the tape off  the skin or you may cause skin irritation.  Then, inspect your skin.  Gently cleanse with soap and water

## 2017-07-11 NOTE — Therapy (Signed)
[place], [place], [address] Phone: [phone]   Fax:  [phone]  Physical Therapy Treatment  Patient Details  Name: Erin Fields MRN: [id number] Date of Birth: 03/04/1966 Referring Provider: Dr. [name]    Encounter Date: 07/11/2017  PT End of Session - 07/11/17 1705    Visit Number  4    Number of Visits  9    Date for PT Re-Evaluation  07/20/17    PT Start Time  1606    PT Stop Time  1651    PT Time Calculation (min)  45 min    Activity Tolerance  Patient tolerated treatment well    Behavior During Therapy  [facility] for tasks assessed/performed       Past Medical History:  Diagnosis Date  . Allergy   . Anxiety   . Asthma   . Cancer [facility])    right breast cancer  . Chicken pox   . Colon polyps   . Depression   . Elevated blood pressure   . Environmental allergies   . Family history of cancer   . Frequent headaches   . GERD (gastroesophageal reflux disease)   . Hay fever   . Hyperlipidemia   . Hypertension   . Hypothyroidism   . Migraines   . PONV (postoperative nausea and vomiting)   . Thyroid disease   . Ulcerative colitis ([place])   . Varicose veins     Past Surgical History:  Procedure Laterality Date  . ABLATION  2011  . BREAST LUMPECTOMY WITH RADIOACTIVE SEED AND SENTINEL LYMPH NODE BIOPSY Right 04/24/2017   Procedure: RIGHT BREAST LUMPECTOMY WITH RADIOACTIVE SEED AND SENTINEL LYMPH NODE BIOPSY;  Surgeon: [name], MD;  Location: [place];  Service: General;  Laterality: Right;  . FOOT SURGERY Right 2012  . HAND SURGERY Right   . RE-EXCISION OF BREAST LUMPECTOMY Right 05/15/2017   Procedure: RE-EXCISION OF BREAST LUMPECTOMY;  Surgeon: [name], MD;  Location: [place];  Service: General;  Laterality: Right;  . Septoplasty with turbinate reduction    . TONSILLECTOMY  1980  . VEIN SURGERY     Removal Right Leg  . WISDOM TOOTH EXTRACTION       There were no vitals filed for this visit.  Subjective Assessment - 07/11/17 1609    Subjective  "I feel kind of bad today.  It's just with treatment, my whole side.  [place] M[place] was a good day, with treatment Monday, and then today it's real bad."    Pertinent History  right sided breast cancer diagnosed around 02/15/2017.  Had lumpectomy with 3 nodes removed on1/11/2017 with re-excision on 05/15/2017 and developed swelling after both surgeries and has had 2 aspiration since  Will not have to have chemo but will have radiation.  medical oncologist is Dr. Burr Medico  Past history includes asthma and arthritis in her knee     Currently in Pain?  Yes    Pain Score  5     Pain Location  Flank    Pain Orientation  Right    Pain Descriptors / Indicators  Other (Comment) just painful    Aggravating Factors   radiation    Pain Relieving Factors  manual lymph drainage                No data recorded       [facility] Adult PT Treatment/Exercise - 07/11/17 0001      Manual Therapy  Manual Therapy  Scapular mobilization;Taping    Scapular Mobilization  in left sidelying: gentle rhythmic rocking motion of right scapula into protraction and depression at varying levels    Manual Lymphatic Drainage (MLD)  Verbal instruction to patient as the following was performed:  short neck, superficial and deep abdomen, left axilla and anterior interaxillary anastomosis, right groin and axillo-inguinal anastomosis, all in supine; then in left sidelying, posterior interaxillary anastomosis right to left and right axillo-inguinal anastomosis.    Kinesiotex  Edema      Kinesiotix   Edema  Skin cote applied to right flank, then kinesiotape in bucket fan shape with bucket near right groin and fan fingers going up toward right axilla and right lateral breast.             PT Education - 07/11/17 1704    Education provided  Yes    Education Details  about manual lymph drainage; to remove kinesiotape if  signs of irritation from it, but otherwise to leave it on until it starts to loosen, and then how to remove it    Person(s) Educated  Patient    Methods  Explanation;Handout    Comprehension  Verbalized understanding          PT Long Term Goals - 07/09/17 1705      PT LONG TERM GOAL #1   Title  Pt will be independent in lymphedema risk reduction practices    Status  On-going      PT LONG TERM GOAL #2   Title  Pt will have > 125 degrees of painful shoulder abduction so that she can received radiation treatment     Status  On-going      PT LONG TERM GOAL #3   Title  Pt will be independent in a home exercise program for ROM and Strength of Upper body     Status  On-going      PT LONG TERM GOAL #4   Title  Pt will be indpendent in self manual lymph draiange and use of compression for right breast lymphedema     Status  On-going            Plan - 07/11/17 1705    Clinical Impression Statement  Pt. noted benefit from last session of manual lymph drainage in providing her a day of comfort afterward.  She did well tolerating treatment today.    Rehab Potential  Good    Clinical Impairments Affecting Rehab Potential  seroma, 3 nodes removed, in mid-RT treatment    PT Frequency  2x / week    PT Duration  4 weeks    PT Treatment/Interventions  ADLs/Self Care Home Management;Taping;Scar mobilization;Passive range of motion;Compression bandaging;Manual lymph drainage;Manual techniques;Patient/family education;Therapeutic exercise;Therapeutic activities    PT Next Visit Plan  continue manual lymph drainage and instruction    Consulted and Agree with Plan of Care  Patient       Patient will benefit from skilled therapeutic intervention in order to improve the following deficits and impairments:  Decreased knowledge of use of DME, Pain, Postural dysfunction, Decreased range of motion, Decreased strength, Impaired perceived functional ability, Obesity, Increased edema, Decreased  knowledge of precautions  Visit Diagnosis: Aftercare following surgery for neoplasm  Localized swelling, mass and lump, trunk     Problem List Patient Active Problem List   Diagnosis Date Noted  . Family history of cancer   . Controlled type 2 diabetes mellitus without complication, without long-term current use of insulin (  [place]) 05/28/2017  . Malignant neoplasm of upper-outer quadrant of right breast in female, estrogen receptor positive ([place]) 03/20/2017  . Crohn disease ([place]) 11/29/2016  . Borderline diabetes 12/17/2015  . Depression 12/17/2015  . Ex-smoker 11/18/2015  . Chronic venous insufficiency 09/02/2015  . Symptomatic varicose veins, right 09/02/2015  . Eustachian tube dysfunction 12/04/2014  . Allergic rhinitis 09/15/2014  . Dyspnea 09/15/2014  . Liver hemangioma 08/24/2014  . Elevated triglycerides with high cholesterol 11/26/2013  . Insomnia 08/20/2013  . Hypertension 08/20/2013  . Ulcerative colitis, unspecified 04/20/2013  . Oral herpes 04/20/2013  . Hyperlipidemia 12/06/2006  . HEADACHE 12/06/2006  . CERVICAL CANCER, HX OF 12/06/2006  . Hypothyroidism 11/29/2006  . Asthma 11/29/2006  . IRRITABLE BOWEL SYNDROME 11/29/2006    Tadeusz Stahl 07/11/2017, 5:07 PM  [place], [place], [address] Phone: [phone]   Fax:  [phone]  Name: CATELYNN SPARGER MRN: [id number] Date of Birth: Sep 08, 1965  Serafina Royals, PT 07/11/17 5:07 PM

## 2017-07-12 ENCOUNTER — Ambulatory Visit
Admission: RE | Admit: 2017-07-12 | Discharge: 2017-07-12 | Disposition: A | Discharge status: Home / Self Care | Payer: BLUE CROSS/BLUE SHIELD | Source: Ambulatory Visit | Attending: Radiation Oncology | Admitting: Radiation Oncology

## 2017-07-12 DIAGNOSIS — C50411 Malignant neoplasm of upper-outer quadrant of right female breast: Secondary | ICD-10-CM | POA: Diagnosis not present

## 2017-07-13 ENCOUNTER — Ambulatory Visit
Admission: RE | Admit: 2017-07-13 | Discharge: 2017-07-13 | Disposition: A | Discharge status: Home / Self Care | Payer: BLUE CROSS/BLUE SHIELD | Source: Ambulatory Visit | Attending: Radiation Oncology | Admitting: Radiation Oncology

## 2017-07-13 DIAGNOSIS — C50411 Malignant neoplasm of upper-outer quadrant of right female breast: Secondary | ICD-10-CM | POA: Diagnosis not present

## 2017-07-16 ENCOUNTER — Telehealth: Payer: Self-pay | Admitting: Genetic Counselor

## 2017-07-16 ENCOUNTER — Other Ambulatory Visit: Payer: Self-pay | Admitting: Student

## 2017-07-16 ENCOUNTER — Ambulatory Visit: Payer: Self-pay | Admitting: Genetic Counselor

## 2017-07-16 ENCOUNTER — Encounter: Payer: Self-pay | Admitting: Genetic Counselor

## 2017-07-16 ENCOUNTER — Ambulatory Visit
Admission: RE | Admit: 2017-07-16 | Discharge: 2017-07-16 | Disposition: A | Discharge status: Home / Self Care | Payer: BLUE CROSS/BLUE SHIELD | Source: Ambulatory Visit | Attending: Radiation Oncology | Admitting: Radiation Oncology

## 2017-07-16 ENCOUNTER — Encounter: Payer: Self-pay | Admitting: Rehabilitation

## 2017-07-16 DIAGNOSIS — C50411 Malignant neoplasm of upper-outer quadrant of right female breast: Secondary | ICD-10-CM | POA: Insufficient documentation

## 2017-07-16 DIAGNOSIS — Z510 Encounter for antineoplastic radiation therapy: Secondary | ICD-10-CM | POA: Diagnosis not present

## 2017-07-16 DIAGNOSIS — N644 Mastodynia: Secondary | ICD-10-CM

## 2017-07-16 DIAGNOSIS — Z1379 Encounter for other screening for genetic and chromosomal anomalies: Secondary | ICD-10-CM

## 2017-07-16 DIAGNOSIS — Z170 Estrogen receptor positive status [ER+]: Secondary | ICD-10-CM

## 2017-07-16 DIAGNOSIS — Z809 Family history of malignant neoplasm, unspecified: Secondary | ICD-10-CM

## 2017-07-16 HISTORY — DX: Encounter for other screening for genetic and chromosomal anomalies: Z13.79

## 2017-07-16 NOTE — Telephone Encounter (Signed)
Revealed negative genetic testing.  Discussed that we do not know why she has breast cancer or why there is cancer in the family. It could be due to a different gene that we are not testing, or maybe our current technology may not be able to pick something up.  It will be important for her to keep in contact with genetics to keep up with whether additional testing may be needed. 

## 2017-07-16 NOTE — Progress Notes (Signed)
HPI: Ms. Rizor was previously seen in the [place] clinic due to a personal and family history of cancer and concerns regarding a hereditary predisposition to cancer. Please refer to our prior cancer genetics clinic note for more information regarding Ms. Arntson's medical, social and family histories, and our assessment and recommendations, at the time. Ms. Ohlsen recent genetic test results were disclosed to her, as were recommendations warranted by these results. These results and recommendations are discussed in more detail below.  CANCER HISTORY:  Oncology History   Cancer Staging Malignant neoplasm of upper-outer quadrant of right breast in female, estrogen receptor positive ([place]) Staging form: Breast, AJCC 8th Edition - Clinical stage from 03/16/2017: Stage IA (cT1b, cN0, cM0, G2, ER: Positive, PR: Positive, HER2: Negative) - Signed by [name], MD on 03/28/2017 - Pathologic: No stage assigned - Unsigned       Malignant neoplasm of upper-outer quadrant of right breast in female, estrogen receptor positive ([place])   03/15/2017 Mammogram    Diagnostic Mammogram Right Breast  IMPRESSION: Palpable abnormality corresponds to distortion and focal mass associated acoustic shadowing measuring 0.8x0.9x0.9cm in the 930 o'clock location of the right breast 7 cm from the nipple.   RECOMMENDATION: Ultrasound-guided core biopsy is recommended and scheduled for the patient.      03/16/2017 Initial Biopsy    Biopsy Results  Diagnosis 03/16/17 Breast, right, needle core biopsy, 9:30 o'clock, ribbon clip placed - INVASIVE MAMMARY CARCINOMA. - SEE COMMENT.       03/16/2017 Receptors her2    Estrogen Receptor: 95%, POSITIVE, STRONG STAINING INTENSITY Progesterone Receptor: 95%, POSITIVE, STRONG STAINING INTENSITY Proliferation Marker Ki67: 3% HER2: NEGATIVE      03/20/2017 Initial Diagnosis    Malignant neoplasm of upper-outer quadrant of right breast in female, estrogen  receptor positive ([place])      07/12/2017 Genetic Testing    Negative genetic testing on the multi-cancer panel.  The Multi-Gene Panel offered by [name] includes sequencing and/or deletion duplication testing of the following 83 genes: ALK, APC, ATM, AXIN2,BAP1,  BARD1, BLM, BMPR1A, BRCA1, BRCA2, BRIP1, CASR, CDC73, CDH1, CDK4, CDKN1B, CDKN1C, CDKN2A (p14ARF), CDKN2A (p16INK4a), CEBPA, CHEK2, CTNNA1, DICER1, DIS3L2, EGFR (c.2369C>T, p.Thr790Met variant only), EPCAM (Deletion/duplication testing only), FH, FLCN, GATA2, GPC3, GREM1 (Promoter region deletion/duplication testing only), HOXB13 (c.251G>A, p.Gly84Glu), HRAS, KIT, MAX, MEN1, MET, MITF (c.952G>A, p.Glu318Lys variant only), MLH1, MSH2, MSH3, MSH6, MUTYH, NBN, NF1, NF2, NTHL1, PALB2, PDGFRA, PHOX2B, PMS2, POLD1, POLE, POT1, PRKAR1A, PTCH1, PTEN, RAD50, RAD51C, RAD51D, RB1, RECQL4, RET, RUNX1, SDHAF2, SDHA (sequence changes only), SDHB, SDHC, SDHD, SMAD4, SMARCA4, SMARCB1, SMARCE1, STK11, SUFU, TERT, TERT, TMEM127, TP53, TSC1, TSC2, VHL, WRN and WT1.  The report date is July 12, 2017.        FAMILY HISTORY:  We obtained a detailed, 4-generation family history.  Significant diagnoses are listed below: Family History  Problem Relation Age of Onset  . Arthritis Mother 64       Deceased  . Breast cancer Mother   . Lung cancer Mother   . Hyperlipidemia Mother   . Heart disease Mother   . Hypertension Mother   . Diabetes Mother   . Crohn's disease Mother   . Diabetes Maternal Grandfather   . Heart disease Maternal Grandfather   . Heart attack Maternal Grandfather   . Leukemia Maternal Grandfather   . Colitis Sister   . Leukemia Cousin 3       mat cousin    The patient has a son and a daughter Physicist, medical  deceased) who were cancer free.  She has a maternal half sister who is cancer free.  Her mother died of metastatic cancer, unsure where the primary was, but she did have breast cancer.  The father was never known.  The patient's mother had one  sister and a paternal half brother.  The full sister is deceased from unknown causes.  The brother is living and healthy.  He has a daughter who had leukemia at 3.  The grandfather died of leukemia.    Ms. Liberati is unaware of previous family history of genetic testing for hereditary cancer risks. Patient's maternal ancestors are of Caucasian descent, and paternal ancestors are of Caucasian descent. There is no reported Ashkenazi Jewish ancestry. There is no known consanguinity.  GENETIC TEST RESULTS: Genetic testing reported out on July 12, 2017 through the multi-cancer panel found no deleterious mutations.  The Multi-Gene Panel offered by [name] includes sequencing and/or deletion duplication testing of the following 83 genes: ALK, APC, ATM, AXIN2,BAP1,  BARD1, BLM, BMPR1A, BRCA1, BRCA2, BRIP1, CASR, CDC73, CDH1, CDK4, CDKN1B, CDKN1C, CDKN2A (p14ARF), CDKN2A (p16INK4a), CEBPA, CHEK2, CTNNA1, DICER1, DIS3L2, EGFR (c.2369C>T, p.Thr790Met variant only), EPCAM (Deletion/duplication testing only), FH, FLCN, GATA2, GPC3, GREM1 (Promoter region deletion/duplication testing only), HOXB13 (c.251G>A, p.Gly84Glu), HRAS, KIT, MAX, MEN1, MET, MITF (c.952G>A, p.Glu318Lys variant only), MLH1, MSH2, MSH3, MSH6, MUTYH, NBN, NF1, NF2, NTHL1, PALB2, PDGFRA, PHOX2B, PMS2, POLD1, POLE, POT1, PRKAR1A, PTCH1, PTEN, RAD50, RAD51C, RAD51D, RB1, RECQL4, RET, RUNX1, SDHAF2, SDHA (sequence changes only), SDHB, SDHC, SDHD, SMAD4, SMARCA4, SMARCB1, SMARCE1, STK11, SUFU, TERT, TERT, TMEM127, TP53, TSC1, TSC2, VHL, WRN and WT1.  The test report has been scanned into EPIC and is located under the Molecular Pathology section of the Results Review tab.   We discussed with Ms. Bieda that since the current genetic testing is not perfect, it is possible there may be a gene mutation in one of these genes that current testing cannot detect, but that chance is small. We also discussed, that it is possible that another gene that has not yet been  discovered, or that we have not yet tested, is responsible for the cancer diagnoses in the family, and it is, therefore, important to remain in touch with cancer genetics in the future so that we can continue to offer Ms. Wiedman the most up to date genetic testing.     CANCER SCREENING RECOMMENDATIONS:  This normal result is reassuring and indicates that Ms. Sidener does not likely have an increased risk of cancer due to a mutation in one of these genes.  We, therefore, recommended  Ms. Luppino continue to follow the cancer screening guidelines provided by her primary healthcare providers.   An individual's cancer risk and medical management are not determined by genetic test results alone. Overall cancer risk assessment incorporates additional factors, including personal medical history, family history, and any available genetic information that may result in a personalized plan for cancer prevention and surveillance.  RECOMMENDATIONS FOR FAMILY MEMBERS: Women in this family might be at some increased risk of developing cancer, over the general population risk, simply due to the family history of cancer. We recommended women in this family have a yearly mammogram beginning at age 20, or 75 years younger than the earliest onset of cancer, an annual clinical breast exam, and perform monthly breast self-exams. Women in this family should also have a gynecological exam as recommended by their primary provider. All family members should have a colonoscopy by age 81.  FOLLOW-UP: Lastly, we discussed  with Ms. Cullins that cancer genetics is a rapidly advancing field and it is possible that new genetic tests will be appropriate for her and/or her family members in the future. We encouraged her to remain in contact with cancer genetics on an annual basis so we can update her personal and family histories and let her know of advances in cancer genetics that may benefit this family.   Our contact number was provided. Ms.  Dondlinger questions were answered to her satisfaction, and she knows she is welcome to call us at anytime with additional questions or concerns.   Roma Kayser, MS, [facility] Certified Genetic Counselor [email or web address]

## 2017-07-17 ENCOUNTER — Ambulatory Visit
Admission: RE | Admit: 2017-07-17 | Discharge: 2017-07-17 | Disposition: A | Discharge status: Home / Self Care | Payer: BLUE CROSS/BLUE SHIELD | Source: Ambulatory Visit | Attending: Radiation Oncology | Admitting: Radiation Oncology

## 2017-07-17 ENCOUNTER — Ambulatory Visit: Payer: BLUE CROSS/BLUE SHIELD | Attending: General Surgery

## 2017-07-17 ENCOUNTER — Other Ambulatory Visit: Payer: Self-pay | Admitting: Family Medicine

## 2017-07-17 DIAGNOSIS — R222 Localized swelling, mass and lump, trunk: Secondary | ICD-10-CM | POA: Insufficient documentation

## 2017-07-17 DIAGNOSIS — F5104 Psychophysiologic insomnia: Secondary | ICD-10-CM

## 2017-07-17 DIAGNOSIS — C50411 Malignant neoplasm of upper-outer quadrant of right female breast: Secondary | ICD-10-CM | POA: Diagnosis not present

## 2017-07-17 DIAGNOSIS — M6281 Muscle weakness (generalized): Secondary | ICD-10-CM | POA: Insufficient documentation

## 2017-07-17 DIAGNOSIS — M25511 Pain in right shoulder: Secondary | ICD-10-CM

## 2017-07-17 DIAGNOSIS — M25611 Stiffness of right shoulder, not elsewhere classified: Secondary | ICD-10-CM | POA: Insufficient documentation

## 2017-07-17 DIAGNOSIS — Z483 Aftercare following surgery for neoplasm: Secondary | ICD-10-CM | POA: Insufficient documentation

## 2017-07-17 NOTE — Therapy (Signed)
[place], [place], [address] Phone: [phone]   Fax:  [phone]  Physical Therapy Treatment  Patient Details  Name: Erin Fields MRN: [id number] Date of Birth: 06-07-1965 Referring Provider: Dr. [name]    Encounter Date: 07/17/2017  PT End of Session - 07/17/17 0902    Visit Number  4 today was not a treatment, so no change in # of visits    Number of Visits  9    Date for PT Re-Evaluation  07/20/17    PT Start Time  0850    PT Stop Time  0859    PT Time Calculation (min)  9 min    Activity Tolerance  Other (comment) no treatment today       Past Medical History:  Diagnosis Date  . Allergy   . Anxiety   . Asthma   . Cancer [facility])    right breast cancer  . Chicken pox   . Colon polyps   . Depression   . Elevated blood pressure   . Environmental allergies   . Family history of cancer   . Frequent headaches   . GERD (gastroesophageal reflux disease)   . Hay fever   . Hyperlipidemia   . Hypertension   . Hypothyroidism   . Migraines   . PONV (postoperative nausea and vomiting)   . Thyroid disease   . Ulcerative colitis ([place])   . Varicose veins     Past Surgical History:  Procedure Laterality Date  . ABLATION  2011  . BREAST LUMPECTOMY WITH RADIOACTIVE SEED AND SENTINEL LYMPH NODE BIOPSY Right 04/24/2017   Procedure: RIGHT BREAST LUMPECTOMY WITH RADIOACTIVE SEED AND SENTINEL LYMPH NODE BIOPSY;  Surgeon: [name], MD;  Location: [place];  Service: General;  Laterality: Right;  . FOOT SURGERY Right 2012  . HAND SURGERY Right   . RE-EXCISION OF BREAST LUMPECTOMY Right 05/15/2017   Procedure: RE-EXCISION OF BREAST LUMPECTOMY;  Surgeon: [name], MD;  Location: [place];  Service: General;  Laterality: Right;  . Septoplasty with turbinate reduction    . TONSILLECTOMY  1980  . VEIN SURGERY     Removal Right Leg  . WISDOM TOOTH EXTRACTION       There were no vitals filed for this visit.  Subjective Assessment - 07/17/17 0859    Subjective  The tape was very helpful! I really want that again. I went to the doctor yesterday because my Rt breast has been hurting, warm and just really tender higher than where my seroma is. So they started me on doxycycline (can't remember the dosage) and scheduled me for an [place] tomorrow since it hurts so bad, so that way they don't have to do another biopsy.     Pertinent History  right sided breast cancer diagnosed around 02/15/2017.  Had lumpectomy with 3 nodes removed on1/11/2017 with re-excision on 05/15/2017 and developed swelling after both surgeries and has had 2 aspiration since  Will not have to have chemo but will have radiation.  medical oncologist is Dr. Burr Medico  Past history includes asthma and arthritis in her knee     Patient Stated Goals  to get shoulder back moving again so she can get her radiation     Currently in Pain?  Yes    Pain Score  -- didn't rate but very tender to touch  NO TREATMENT TODAY                   PT Long Term Goals - 07/09/17 1705      PT LONG TERM GOAL #1   Title  Pt will be independent in lymphedema risk reduction practices    Status  On-going      PT LONG TERM GOAL #2   Title  Pt will have > 125 degrees of painful shoulder abduction so that she can received radiation treatment     Status  On-going      PT LONG TERM GOAL #3   Title  Pt will be independent in a home exercise program for ROM and Strength of Upper body     Status  On-going      PT LONG TERM GOAL #4   Title  Pt will be indpendent in self manual lymph draiange and use of compression for right breast lymphedema     Status  On-going            Plan - 07/17/17 0903    Clinical Impression Statement  Pt came in reporting the tape had really helped her last time. However, she has been placed on doxycycline as of yesterday for possible infection and she  is scheduled to have an [place] tomorrow. So after Maudry Diego, PT also briefly assessed pt decided to hold treatment today until pt has [place] tomorrow for definitive diagnosis. Rt breast area is warm to touch upon assessment and, per pt, slightly redder than usual.     Rehab Potential  Good    Clinical Impairments Affecting Rehab Potential  seroma, 3 nodes removed, in mid-RT treatment    PT Frequency  2x / week    PT Duration  4 weeks    PT Treatment/Interventions  ADLs/Self Care Home Management;Taping;Scar mobilization;Passive range of motion;Compression bandaging;Manual lymph drainage;Manual techniques;Patient/family education;Therapeutic exercise;Therapeutic activities    PT Next Visit Plan  See if pt has infection and hold until antibiotic finished, if not continue manual lymph drainage and instruction    Consulted and Agree with Plan of Care  Patient       Patient will benefit from skilled therapeutic intervention in order to improve the following deficits and impairments:  Decreased knowledge of use of DME, Pain, Postural dysfunction, Decreased range of motion, Decreased strength, Impaired perceived functional ability, Obesity, Increased edema, Decreased knowledge of precautions  Visit Diagnosis: Aftercare following surgery for neoplasm  Localized swelling, mass and lump, trunk  Acute pain of right shoulder     Problem List Patient Active Problem List   Diagnosis Date Noted  . Genetic testing 07/16/2017  . Family history of cancer   . Controlled type 2 diabetes mellitus without complication, without long-term current use of insulin ([place]) 05/28/2017  . Malignant neoplasm of upper-outer quadrant of right breast in female, estrogen receptor positive ([place]) 03/20/2017  . Crohn disease ([place]) 11/29/2016  . Borderline diabetes 12/17/2015  . Depression 12/17/2015  . Ex-smoker 11/18/2015  . Chronic venous insufficiency 09/02/2015  . Symptomatic varicose veins, right 09/02/2015  . Eustachian  tube dysfunction 12/04/2014  . Allergic rhinitis 09/15/2014  . Dyspnea 09/15/2014  . Liver hemangioma 08/24/2014  . Elevated triglycerides with high cholesterol 11/26/2013  . Insomnia 08/20/2013  . Hypertension 08/20/2013  . Ulcerative colitis, unspecified 04/20/2013  . Oral herpes 04/20/2013  . Hyperlipidemia 12/06/2006  . HEADACHE 12/06/2006  . CERVICAL CANCER, HX OF 12/06/2006  . Hypothyroidism 11/29/2006  . Asthma 11/29/2006  .  IRRITABLE BOWEL SYNDROME 11/29/2006    Otelia Limes, PTA 07/17/2017, 9:10 AM  [place], [place], [address] Phone: [phone]   Fax:  [phone]  Name: Erin Fields MRN: [id number] Date of Birth: [birth year]

## 2017-07-18 ENCOUNTER — Ambulatory Visit
Admission: RE | Admit: 2017-07-18 | Discharge: 2017-07-18 | Disposition: A | Discharge status: Home / Self Care | Payer: BLUE CROSS/BLUE SHIELD | Source: Ambulatory Visit | Attending: Student | Admitting: Student

## 2017-07-18 ENCOUNTER — Ambulatory Visit
Admission: RE | Admit: 2017-07-18 | Discharge: 2017-07-18 | Disposition: A | Discharge status: Home / Self Care | Payer: BLUE CROSS/BLUE SHIELD | Source: Ambulatory Visit | Attending: Radiation Oncology | Admitting: Radiation Oncology

## 2017-07-18 ENCOUNTER — Other Ambulatory Visit: Payer: Self-pay | Admitting: Student

## 2017-07-18 DIAGNOSIS — Z1231 Encounter for screening mammogram for malignant neoplasm of breast: Secondary | ICD-10-CM

## 2017-07-18 DIAGNOSIS — N611 Abscess of the breast and nipple: Secondary | ICD-10-CM

## 2017-07-18 DIAGNOSIS — N644 Mastodynia: Secondary | ICD-10-CM

## 2017-07-18 DIAGNOSIS — C50411 Malignant neoplasm of upper-outer quadrant of right female breast: Secondary | ICD-10-CM | POA: Diagnosis not present

## 2017-07-18 HISTORY — DX: Personal history of irradiation: Z92.3

## 2017-07-18 NOTE — Telephone Encounter (Signed)
Received refill request for zolpidem (AMBIEN) 5 MG tablet. Last office visit 05/28/17 and last refill 05/28/17.

## 2017-07-18 NOTE — Telephone Encounter (Signed)
Reviewed [facility]- ok to refill  

## 2017-07-19 ENCOUNTER — Ambulatory Visit: Payer: BLUE CROSS/BLUE SHIELD | Admitting: Physical Therapy

## 2017-07-19 ENCOUNTER — Ambulatory Visit
Admission: RE | Admit: 2017-07-19 | Discharge: 2017-07-19 | Disposition: A | Discharge status: Home / Self Care | Payer: BLUE CROSS/BLUE SHIELD | Source: Ambulatory Visit | Attending: Radiation Oncology | Admitting: Radiation Oncology

## 2017-07-19 ENCOUNTER — Encounter: Payer: Self-pay | Admitting: Physical Therapy

## 2017-07-19 DIAGNOSIS — M25611 Stiffness of right shoulder, not elsewhere classified: Secondary | ICD-10-CM | POA: Diagnosis present

## 2017-07-19 DIAGNOSIS — M25511 Pain in right shoulder: Secondary | ICD-10-CM

## 2017-07-19 DIAGNOSIS — M6281 Muscle weakness (generalized): Secondary | ICD-10-CM | POA: Diagnosis present

## 2017-07-19 DIAGNOSIS — R222 Localized swelling, mass and lump, trunk: Secondary | ICD-10-CM | POA: Diagnosis present

## 2017-07-19 DIAGNOSIS — Z483 Aftercare following surgery for neoplasm: Secondary | ICD-10-CM

## 2017-07-19 DIAGNOSIS — C50411 Malignant neoplasm of upper-outer quadrant of right female breast: Secondary | ICD-10-CM | POA: Diagnosis not present

## 2017-07-19 NOTE — Therapy (Signed)
[place], [place], [address] Phone: [phone]   Fax:  [phone]  Physical Therapy Treatment  Patient Details  Name: Erin Fields MRN: [id number] Date of Birth: [birth year] Referring Provider: Dr. [name]    Encounter Date: 07/19/2017  PT End of Session - 07/19/17 1705    Visit Number  5    Number of Visits  9    Date for PT Re-Evaluation  07/20/17    PT Start Time  4193    PT Stop Time  1600    PT Time Calculation (min)  45 min    Activity Tolerance  Patient tolerated treatment well    Behavior During Therapy  [facility] for tasks assessed/performed       Past Medical History:  Diagnosis Date  . Allergy   . Anxiety   . Asthma   . Cancer [facility])    right breast cancer  . Chicken pox   . Colon polyps   . Depression   . Elevated blood pressure   . Environmental allergies   . Family history of cancer   . Frequent headaches   . GERD (gastroesophageal reflux disease)   . Hay fever   . Hyperlipidemia   . Hypertension   . Hypothyroidism   . Migraines   . Personal history of radiation therapy   . PONV (postoperative nausea and vomiting)   . Thyroid disease   . Ulcerative colitis ([place])   . Varicose veins     Past Surgical History:  Procedure Laterality Date  . ABLATION  2011  . BREAST LUMPECTOMY Right   . BREAST LUMPECTOMY WITH RADIOACTIVE SEED AND SENTINEL LYMPH NODE BIOPSY Right 04/24/2017   Procedure: RIGHT BREAST LUMPECTOMY WITH RADIOACTIVE SEED AND SENTINEL LYMPH NODE BIOPSY;  Surgeon: [name], MD;  Location: [place];  Service: General;  Laterality: Right;  . FOOT SURGERY Right 2012  . HAND SURGERY Right   . RE-EXCISION OF BREAST LUMPECTOMY Right 05/15/2017   Procedure: RE-EXCISION OF BREAST LUMPECTOMY;  Surgeon: [name], MD;  Location: [place];  Service: General;  Laterality: Right;  . Septoplasty with turbinate reduction    . TONSILLECTOMY   1980  . VEIN SURGERY     Removal Right Leg  . WISDOM TOOTH EXTRACTION      There were no vitals filed for this visit.  Subjective Assessment - 07/19/17 1702    Subjective  Pt states she had an ultrasound and the MD cleared her for continued MLD and treatment. She wants to get taped again as she found that very helpful     Pertinent History  right sided breast cancer diagnosed around 02/15/2017.  Had lumpectomy with 3 nodes removed on1/11/2017 with re-excision on 05/15/2017 and developed swelling after both surgeries and has had 2 aspiration since  Will not have to have chemo but will have radiation.  medical oncologist is Dr. Burr Medico  Past history includes asthma and arthritis in her knee     Patient Stated Goals  to get shoulder back moving again so she can get her radiation     Currently in Pain?  No/denies                       [facility] Adult PT Treatment/Exercise - 07/19/17 0001      Self-Care   Other Self-Care Comments   talked about pt going to [employer] [place] to look at a soft stretchy  camisole for light comopression that will not cut into abdomen       Manual Therapy   Manual Therapy  Scapular mobilization;Taping    Manual Lymphatic Drainage (MLD)  Supine short neck, deep abdmonimal and abdmonimal resisted breathing, L axillary nodes, interaxillary anastamosis, R inguinal nodes, R axillo-inguinal anastamosis, Lt sidelying work posterior interaxillary and axillo-inguinal with focus on scapular region and lateral trunk.  Avoiding irradiated regions    Kinesiotex  Edema      Kinesiotix   Edema  2 layers of Skin cote applied to right flank, then kinesiotape in bucket fan shape with bucket near right groin and fan fingers going up toward right axilla and right lateral breast. pt instructed to remove in no more than 2 days                   PT Long Term Goals - 07/09/17 1705      PT LONG TERM GOAL #1   Title  Pt will be independent in lymphedema risk reduction  practices    Status  On-going      PT LONG TERM GOAL #2   Title  Pt will have > 125 degrees of painful shoulder abduction so that she can received radiation treatment     Status  On-going      PT LONG TERM GOAL #3   Title  Pt will be independent in a home exercise program for ROM and Strength of Upper body     Status  On-going      PT LONG TERM GOAL #4   Title  Pt will be indpendent in self manual lymph draiange and use of compression for right breast lymphedema     Status  On-going            Plan - 07/19/17 1706    Clinical Impression Statement  Pt continues with multiple areas of seroma collection and still has 13 scheduled radiation treatments.  She is due for renewal next visit.  If she is comfortable with compression cami, may need to suspend treatment till after radiation or continue for MLD for symptom managment if pt is getting significant relief from treatmtne     Rehab Potential  Good    Clinical Impairments Affecting Rehab Potential  seroma, 3 nodes removed, in mid-RT treatment    PT Frequency  2x / week    PT Duration  4 weeks    PT Treatment/Interventions  ADLs/Self Care Home Management;Taping;Scar mobilization;Passive range of motion;Compression bandaging;Manual lymph drainage;Manual techniques;Patient/family education;Therapeutic exercise;Therapeutic activities    PT Next Visit Plan  Decide on renewal or on hold til after radiation,     Consulted and Agree with Plan of Care  Patient       Patient will benefit from skilled therapeutic intervention in order to improve the following deficits and impairments:  Decreased knowledge of use of DME, Pain, Postural dysfunction, Decreased range of motion, Decreased strength, Impaired perceived functional ability, Obesity, Increased edema, Decreased knowledge of precautions  Visit Diagnosis: Aftercare following surgery for neoplasm  Localized swelling, mass and lump, trunk  Acute pain of right shoulder  Stiffness of  right shoulder, not elsewhere classified  Muscle weakness (generalized)     Problem List Patient Active Problem List   Diagnosis Date Noted  . Genetic testing 07/16/2017  . Family history of cancer   . Controlled type 2 diabetes mellitus without complication, without long-term current use of insulin ([place]) 05/28/2017  . Malignant neoplasm of upper-outer quadrant  of right breast in female, estrogen receptor positive ([place]) 03/20/2017  . Crohn disease ([place]) 11/29/2016  . Borderline diabetes 12/17/2015  . Depression 12/17/2015  . Ex-smoker 11/18/2015  . Chronic venous insufficiency 09/02/2015  . Symptomatic varicose veins, right 09/02/2015  . Eustachian tube dysfunction 12/04/2014  . Allergic rhinitis 09/15/2014  . Dyspnea 09/15/2014  . Liver hemangioma 08/24/2014  . Elevated triglycerides with high cholesterol 11/26/2013  . Insomnia 08/20/2013  . Hypertension 08/20/2013  . Ulcerative colitis, unspecified 04/20/2013  . Oral herpes 04/20/2013  . Hyperlipidemia 12/06/2006  . HEADACHE 12/06/2006  . CERVICAL CANCER, HX OF 12/06/2006  . Hypothyroidism 11/29/2006  . Asthma 11/29/2006  . IRRITABLE BOWEL SYNDROME 11/29/2006   Donato Heinz. Owens Shark PT  Norwood Levo 07/19/2017, 5:10 PM  [place], [place], [address] Phone: [phone]   Fax:  [phone]  Name: Erin Fields MRN: [id number] Date of Birth: [birth year]

## 2017-07-20 ENCOUNTER — Ambulatory Visit
Admission: RE | Admit: 2017-07-20 | Discharge: 2017-07-20 | Disposition: A | Discharge status: Home / Self Care | Payer: BLUE CROSS/BLUE SHIELD | Source: Ambulatory Visit | Attending: Radiation Oncology | Admitting: Radiation Oncology

## 2017-07-20 DIAGNOSIS — C50411 Malignant neoplasm of upper-outer quadrant of right female breast: Secondary | ICD-10-CM | POA: Diagnosis not present

## 2017-07-23 ENCOUNTER — Encounter: Payer: Self-pay | Admitting: Rehabilitation

## 2017-07-23 ENCOUNTER — Ambulatory Visit
Admission: RE | Admit: 2017-07-23 | Discharge: 2017-07-23 | Disposition: A | Discharge status: Home / Self Care | Payer: BLUE CROSS/BLUE SHIELD | Source: Ambulatory Visit | Attending: Radiation Oncology | Admitting: Radiation Oncology

## 2017-07-23 ENCOUNTER — Ambulatory Visit: Payer: BLUE CROSS/BLUE SHIELD | Admitting: Rehabilitation

## 2017-07-23 DIAGNOSIS — Z483 Aftercare following surgery for neoplasm: Secondary | ICD-10-CM

## 2017-07-23 DIAGNOSIS — M6281 Muscle weakness (generalized): Secondary | ICD-10-CM

## 2017-07-23 DIAGNOSIS — M25511 Pain in right shoulder: Secondary | ICD-10-CM

## 2017-07-23 DIAGNOSIS — R222 Localized swelling, mass and lump, trunk: Secondary | ICD-10-CM

## 2017-07-23 DIAGNOSIS — C50411 Malignant neoplasm of upper-outer quadrant of right female breast: Secondary | ICD-10-CM | POA: Diagnosis not present

## 2017-07-23 NOTE — Therapy (Signed)
[place], [place], [address] Phone: [phone]   Fax:  [phone]  Physical Therapy Treatment  Patient Details  Name: Erin Fields MRN: [id number] Date of Birth: 1965/08/09 Referring Provider: Dr. [name]    Encounter Date: 07/23/2017  PT End of Session - 07/23/17 1544    Visit Number  6    Number of Visits  13    Date for PT Re-Evaluation  08/20/17    PT Start Time  1430    PT Stop Time  1513    PT Time Calculation (min)  43 min    Activity Tolerance  Patient tolerated treatment well    Behavior During Therapy  [facility] for tasks assessed/performed       Past Medical History:  Diagnosis Date  . Allergy   . Anxiety   . Asthma   . Cancer [facility])    right breast cancer  . Chicken pox   . Colon polyps   . Depression   . Elevated blood pressure   . Environmental allergies   . Family history of cancer   . Frequent headaches   . GERD (gastroesophageal reflux disease)   . Hay fever   . Hyperlipidemia   . Hypertension   . Hypothyroidism   . Migraines   . Personal history of radiation therapy   . PONV (postoperative nausea and vomiting)   . Thyroid disease   . Ulcerative colitis ([place])   . Varicose veins     Past Surgical History:  Procedure Laterality Date  . ABLATION  2011  . BREAST LUMPECTOMY Right   . BREAST LUMPECTOMY WITH RADIOACTIVE SEED AND SENTINEL LYMPH NODE BIOPSY Right 04/24/2017   Procedure: RIGHT BREAST LUMPECTOMY WITH RADIOACTIVE SEED AND SENTINEL LYMPH NODE BIOPSY;  Surgeon: [name], MD;  Location: [place];  Service: General;  Laterality: Right;  . FOOT SURGERY Right 2012  . HAND SURGERY Right   . RE-EXCISION OF BREAST LUMPECTOMY Right 05/15/2017   Procedure: RE-EXCISION OF BREAST LUMPECTOMY;  Surgeon: [name], MD;  Location: [place];  Service: General;  Laterality: Right;  . Septoplasty with turbinate reduction    . TONSILLECTOMY   1980  . VEIN SURGERY     Removal Right Leg  . WISDOM TOOTH EXTRACTION      There were no vitals filed for this visit.  Subjective Assessment - 07/23/17 1424    Subjective  drained the Rt breast superiorly on Friday now has a bruise.  Going back to the breast center on 22nd to get another mammogram to measure fluid levels.  Had another reaction to the tape with it on the scapula. a bit red and itchy.  Got a compression radiation tank top but hasn't worn it yet    Pertinent History  right sided breast cancer diagnosed around 02/15/2017.  Had lumpectomy with 3 nodes removed on1/11/2017 with re-excision on 05/15/2017 and developed swelling after both surgeries and has had 2 aspiration since  Will not have to have chemo but will have radiation.  medical oncologist is Dr. Burr Medico  Past history includes asthma and arthritis in her knee     Patient Stated Goals  help with R breast and trunk pain and edema    Currently in Pain?  No/denies    Pain Orientation  Right                       [facility] PT  Treatment/Exercise - 07/23/17 0001      Manual Therapy   Manual therapy comments  supine shoulder PROM performed just to assess with a visile puckering of the inner upper arm and pt experiencing pull from the upper arm to the axilla; education on SNB incision massage due to sensitivity    Manual Lymphatic Drainage (MLD)  Supine short neck, deep abdmonimal and abdmonimal resisted breathing, L axillary nodes, interaxillary anastamosis, R inguinal nodes, R axillo-inguinal anastamosis, Lt sidelying work posterior interaxillary and axillo-inguinal with focus on scapular region and lateral trunk.  Avoiding irradiated regions                  PT Long Term Goals - 07/23/17 1703      PT LONG TERM GOAL #1   Title  Pt will be independent in lymphedema risk reduction practices    Status  On-going      PT LONG TERM GOAL #2   Title  Pt will have > 125 degrees of painful shoulder abduction  so that she can received radiation treatment     Status  On-going      PT LONG TERM GOAL #3   Title  Pt will be independent in a home exercise program for ROM and Strength of Upper body     Status  On-going      PT LONG TERM GOAL #4   Title  Pt will be indpendent in self manual lymph draiange and use of compression for right breast lymphedema     Status  On-going            Plan - 07/23/17 1655    Clinical Impression Statement  Pt with less pain, discomfort, and complaints of swelling after seroma draining of 21m.  Thinks the tape is very beneficial but wants to give the skin a rest due to irritation this past week wearing it x 1 day.  Has a radiation cami to wear now and will try that out.  PT noticing an UE/axillary cord today from the axilla to the antecubital fossa on the Left. Presents more with a skin puckering in the upper arm with horizontal abduction.      Clinical Impairments Affecting Rehab Potential  seroma, 3 nodes removed, in mid-RT treatment    PT Frequency  2x / week    PT Duration  4 weeks    PT Treatment/Interventions  ADLs/Self Care Home Management;Taping;Scar mobilization;Passive range of motion;Compression bandaging;Manual lymph drainage;Manual techniques;Patient/family education;Therapeutic exercise;Therapeutic activities    PT Next Visit Plan  Pt to continue with PT at this time for continued work on MLD, taping as tolerated only x 1 day and with skin-cote applied, shoulder ROM and release work for L axillary cording, TE as necessary    PT Home Exercise Plan  given supine pectoralis horizontal abduction stretch today with arms bent and straight    Consulted and Agree with Plan of Care  Patient       Patient will benefit from skilled therapeutic intervention in order to improve the following deficits and impairments:  Decreased knowledge of use of DME, Pain, Postural dysfunction, Decreased range of motion, Decreased strength, Impaired perceived functional ability,  Obesity, Increased edema, Decreased knowledge of precautions  Visit Diagnosis: Aftercare following surgery for neoplasm - Plan: PT plan of care cert/re-cert  Localized swelling, mass and lump, trunk - Plan: PT plan of care cert/re-cert  Acute pain of right shoulder - Plan: PT plan of care cert/re-cert  Muscle weakness (generalized) -  Plan: PT plan of care cert/re-cert     Problem List Patient Active Problem List   Diagnosis Date Noted  . Genetic testing 07/16/2017  . Family history of cancer   . Controlled type 2 diabetes mellitus without complication, without long-term current use of insulin ([place]) 05/28/2017  . Malignant neoplasm of upper-outer quadrant of right breast in female, estrogen receptor positive ([place]) 03/20/2017  . Crohn disease ([place]) 11/29/2016  . Borderline diabetes 12/17/2015  . Depression 12/17/2015  . Ex-smoker 11/18/2015  . Chronic venous insufficiency 09/02/2015  . Symptomatic varicose veins, right 09/02/2015  . Eustachian tube dysfunction 12/04/2014  . Allergic rhinitis 09/15/2014  . Dyspnea 09/15/2014  . Liver hemangioma 08/24/2014  . Elevated triglycerides with high cholesterol 11/26/2013  . Insomnia 08/20/2013  . Hypertension 08/20/2013  . Ulcerative colitis, unspecified 04/20/2013  . Oral herpes 04/20/2013  . Hyperlipidemia 12/06/2006  . HEADACHE 12/06/2006  . CERVICAL CANCER, HX OF 12/06/2006  . Hypothyroidism 11/29/2006  . Asthma 11/29/2006  . IRRITABLE BOWEL SYNDROME 11/29/2006    Shan Levans, PT 07/23/2017, 5:07 PM  [place], [place], [address] Phone: [phone]   Fax:  [phone]  Name: Erin Fields MRN: [id number] Date of Birth: 06/22/1965

## 2017-07-23 NOTE — Patient Instructions (Signed)
  No foam roll  Scar Massage  Scar massage is done to improve the mobility of scar, decrease scar tissue from building up, reduce adhesions, and prevent Keloids from forming. Start scar massage after scabs have fallen off by themselves and no open areas. The first few weeks after surgery, it is normal for a scar to appear pink or red and slightly raised. Scars can itch or have areas of numbness. Some scars may be sensitive.   Direct Scar massage: after scar is healed, no opening, no scab 1.  Place pads of two fingers together directly on the scar starting at one end of the scar. Move the fingers up and down across the scar holding 5 seconds one direction.  Then go opposite direction hold 5 seconds.  2. Move over to the next section of the scar and repeat.  Work your way along the entire length of the scar.   3. Next make diagonal movements along the scar holding 5 seconds at one direction. 4. Next movement is side to side. 5. Do not rub fingers over the scar.  Instead keep firm pressure and move scar over the tissue it is on top   Scar Lift and Roll 12 weeks after surgery. 1. Pinch a small amount of the scar between your first two fingers and thumb.  2. Roll the scar between your fingers for 5 to 15 seconds. 3. Move along the scar and repeat until you have massaged the entire length of scar.   Stop the massage and call your doctor if you notice: 1. Increased redness 2. Bleeding from scar 3. Seepage coming from the scar 4. Scar is warmer and has increased pain

## 2017-07-24 ENCOUNTER — Ambulatory Visit
Admission: RE | Admit: 2017-07-24 | Discharge: 2017-07-24 | Disposition: A | Discharge status: Home / Self Care | Payer: BLUE CROSS/BLUE SHIELD | Source: Ambulatory Visit | Attending: Radiation Oncology | Admitting: Radiation Oncology

## 2017-07-24 DIAGNOSIS — C50411 Malignant neoplasm of upper-outer quadrant of right female breast: Secondary | ICD-10-CM | POA: Diagnosis not present

## 2017-07-25 ENCOUNTER — Ambulatory Visit: Payer: BLUE CROSS/BLUE SHIELD

## 2017-07-25 ENCOUNTER — Ambulatory Visit
Admission: RE | Admit: 2017-07-25 | Discharge: 2017-07-25 | Disposition: A | Discharge status: Home / Self Care | Payer: BLUE CROSS/BLUE SHIELD | Source: Ambulatory Visit | Attending: Radiation Oncology | Admitting: Radiation Oncology

## 2017-07-25 DIAGNOSIS — R222 Localized swelling, mass and lump, trunk: Secondary | ICD-10-CM

## 2017-07-25 DIAGNOSIS — M25511 Pain in right shoulder: Secondary | ICD-10-CM

## 2017-07-25 DIAGNOSIS — Z483 Aftercare following surgery for neoplasm: Secondary | ICD-10-CM

## 2017-07-25 DIAGNOSIS — C50411 Malignant neoplasm of upper-outer quadrant of right female breast: Secondary | ICD-10-CM | POA: Diagnosis not present

## 2017-07-25 NOTE — Therapy (Signed)
[place], [place], [address] Phone: [phone]   Fax:  [phone]  Physical Therapy Treatment  Patient Details  Name: Erin Fields MRN: [id number] Date of Birth: 06-11-1965 Referring Provider: Dr. [name]    Encounter Date: 07/25/2017  PT End of Session - 07/25/17 1520    Visit Number  7    Number of Visits  13    Date for PT Re-Evaluation  08/20/17    PT Start Time  7672    PT Stop Time  1520    PT Time Calculation (min)  42 min    Activity Tolerance  Patient tolerated treatment well    Behavior During Therapy  [facility] for tasks assessed/performed       Past Medical History:  Diagnosis Date  . Allergy   . Anxiety   . Asthma   . Cancer [facility])    right breast cancer  . Chicken pox   . Colon polyps   . Depression   . Elevated blood pressure   . Environmental allergies   . Family history of cancer   . Frequent headaches   . GERD (gastroesophageal reflux disease)   . Hay fever   . Hyperlipidemia   . Hypertension   . Hypothyroidism   . Migraines   . Personal history of radiation therapy   . PONV (postoperative nausea and vomiting)   . Thyroid disease   . Ulcerative colitis ([place])   . Varicose veins     Past Surgical History:  Procedure Laterality Date  . ABLATION  2011  . BREAST LUMPECTOMY Right   . BREAST LUMPECTOMY WITH RADIOACTIVE SEED AND SENTINEL LYMPH NODE BIOPSY Right 04/24/2017   Procedure: RIGHT BREAST LUMPECTOMY WITH RADIOACTIVE SEED AND SENTINEL LYMPH NODE BIOPSY;  Surgeon: [name], MD;  Location: [place];  Service: General;  Laterality: Right;  . FOOT SURGERY Right 2012  . HAND SURGERY Right   . RE-EXCISION OF BREAST LUMPECTOMY Right 05/15/2017   Procedure: RE-EXCISION OF BREAST LUMPECTOMY;  Surgeon: [name], MD;  Location: [place];  Service: General;  Laterality: Right;  . Septoplasty with turbinate reduction    . TONSILLECTOMY   1980  . VEIN SURGERY     Removal Right Leg  . WISDOM TOOTH EXTRACTION      There were no vitals filed for this visit.  Subjective Assessment - 07/25/17 1441    Subjective  Overall Rt breast is still feeling better from second seroma but it is still really bruised and tender. Dimpling in my Rt upper arm is about the same.     Pertinent History  right sided breast cancer diagnosed around 02/15/2017.  Had lumpectomy with 3 nodes removed on1/11/2017 with re-excision on 05/15/2017 and developed swelling after both surgeries and has had 2 aspiration since  Will not have to have chemo but will have radiation.  medical oncologist is Dr. Burr Medico  Past history includes asthma and arthritis in her knee     Patient Stated Goals  help with R breast and trunk pain and edema    Currently in Pain?  No/denies                       [facility] Adult PT Treatment/Exercise - 07/25/17 0001      Manual Therapy   Manual Therapy  Myofascial release;Manual Lymphatic Drainage (MLD);Passive ROM    Myofascial Release  To Rt UE with pulling and to  axilla and upper arm at area of dimpling    Manual Lymphatic Drainage (MLD)  Supine short neck, deep abdmonimal and abdmonimal resisted breathing, L axillary nodes, interaxillary anastamosis, R inguinal nodes, R axillo-inguinal anastamosis, Lt sidelying work posterior interaxillary and axillo-inguinal with focus on scapular region and lateral trunk.  Avoiding irradiated regions    Passive ROM  In Supine in to flexion and abduction to pts tolerance      Kinesiotix   Edema  2 layers of Skin cote applied to right flank, then kinesiotape in bucket fan shape with bucket near right groin and fan fingers going up toward right axilla but stopped near inferior breast dur to irradiated tissue.                  PT Long Term Goals - 07/23/17 1703      PT LONG TERM GOAL #1   Title  Pt will be independent in lymphedema risk reduction practices    Status  On-going       PT LONG TERM GOAL #2   Title  Pt will have > 125 degrees of painful shoulder abduction so that she can received radiation treatment     Status  On-going      PT LONG TERM GOAL #3   Title  Pt will be independent in a home exercise program for ROM and Strength of Upper body     Status  On-going      PT LONG TERM GOAL #4   Title  Pt will be indpendent in self manual lymph draiange and use of compression for right breast lymphedema     Status  On-going            Plan - 07/25/17 1520    Clinical Impression Statement  Pt reports still having pain in upper arm with end range stretching buthtis did improve throughout session as did her end P/ROM visibly. Resumed kiensiotape to Rt flank area as pt reports having redness but this goes away within 24 hrs if she doesn't leave the tape on for longer than a day. Pt had no redness where tape was applied today. Briefly reviewed supine horizontal abduction/er stretch over towel to answer pts questions about technique. Instructed pt that if skin continues to be too sensitive and if openings at skin occur we can hold PT until after radiation. She verbalized understanding this.     Rehab Potential  Good    Clinical Impairments Affecting Rehab Potential  seroma, 3 nodes removed, in mid-RT treatment    PT Frequency  2x / week    PT Duration  4 weeks    PT Treatment/Interventions  ADLs/Self Care Home Management;Taping;Scar mobilization;Passive range of motion;Compression bandaging;Manual lymph drainage;Manual techniques;Patient/family education;Therapeutic exercise;Therapeutic activities    PT Next Visit Plan  Pt to continue with PT at this time for continued work on MLD, taping as tolerated only x 1 day and with skin-cote applied, shoulder ROM and release work for L axillary cording, TE as necessary    Consulted and Agree with Plan of Care  Patient       Patient will benefit from skilled therapeutic intervention in order to improve the following  deficits and impairments:  Decreased knowledge of use of DME, Pain, Postural dysfunction, Decreased range of motion, Decreased strength, Impaired perceived functional ability, Obesity, Increased edema, Decreased knowledge of precautions  Visit Diagnosis: Aftercare following surgery for neoplasm  Localized swelling, mass and lump, trunk  Acute pain of right shoulder  Problem List Patient Active Problem List   Diagnosis Date Noted  . Genetic testing 07/16/2017  . Family history of cancer   . Controlled type 2 diabetes mellitus without complication, without long-term current use of insulin ([place]) 05/28/2017  . Malignant neoplasm of upper-outer quadrant of right breast in female, estrogen receptor positive ([place]) 03/20/2017  . Crohn disease ([place]) 11/29/2016  . Borderline diabetes 12/17/2015  . Depression 12/17/2015  . Ex-smoker 11/18/2015  . Chronic venous insufficiency 09/02/2015  . Symptomatic varicose veins, right 09/02/2015  . Eustachian tube dysfunction 12/04/2014  . Allergic rhinitis 09/15/2014  . Dyspnea 09/15/2014  . Liver hemangioma 08/24/2014  . Elevated triglycerides with high cholesterol 11/26/2013  . Insomnia 08/20/2013  . Hypertension 08/20/2013  . Ulcerative colitis, unspecified 04/20/2013  . Oral herpes 04/20/2013  . Hyperlipidemia 12/06/2006  . HEADACHE 12/06/2006  . CERVICAL CANCER, HX OF 12/06/2006  . Hypothyroidism 11/29/2006  . Asthma 11/29/2006  . IRRITABLE BOWEL SYNDROME 11/29/2006    Otelia Limes, PTA 07/25/2017, 3:24 PM  [place], [place], [address] Phone: [phone]   Fax:  [phone]  Name: MONSERRATE BLASCHKE MRN: [id number] Date of Birth: 11/28/1965

## 2017-07-26 ENCOUNTER — Ambulatory Visit
Admission: RE | Admit: 2017-07-26 | Discharge: 2017-07-26 | Disposition: A | Discharge status: Home / Self Care | Payer: BLUE CROSS/BLUE SHIELD | Source: Ambulatory Visit | Attending: Radiation Oncology | Admitting: Radiation Oncology

## 2017-07-26 DIAGNOSIS — C50411 Malignant neoplasm of upper-outer quadrant of right female breast: Secondary | ICD-10-CM | POA: Diagnosis not present

## 2017-07-27 ENCOUNTER — Ambulatory Visit
Admission: RE | Admit: 2017-07-27 | Discharge: 2017-07-27 | Disposition: A | Discharge status: Home / Self Care | Payer: BLUE CROSS/BLUE SHIELD | Source: Ambulatory Visit | Attending: Radiation Oncology | Admitting: Radiation Oncology

## 2017-07-27 DIAGNOSIS — C50411 Malignant neoplasm of upper-outer quadrant of right female breast: Secondary | ICD-10-CM | POA: Diagnosis not present

## 2017-07-30 ENCOUNTER — Encounter: Payer: Self-pay | Admitting: Rehabilitation

## 2017-07-30 ENCOUNTER — Ambulatory Visit: Payer: BLUE CROSS/BLUE SHIELD | Admitting: Rehabilitation

## 2017-07-30 ENCOUNTER — Ambulatory Visit
Admission: RE | Admit: 2017-07-30 | Discharge: 2017-07-30 | Disposition: A | Discharge status: Home / Self Care | Payer: BLUE CROSS/BLUE SHIELD | Source: Ambulatory Visit | Attending: Radiation Oncology | Admitting: Radiation Oncology

## 2017-07-30 DIAGNOSIS — C50411 Malignant neoplasm of upper-outer quadrant of right female breast: Secondary | ICD-10-CM | POA: Diagnosis not present

## 2017-07-30 DIAGNOSIS — M25511 Pain in right shoulder: Secondary | ICD-10-CM

## 2017-07-30 DIAGNOSIS — M25611 Stiffness of right shoulder, not elsewhere classified: Secondary | ICD-10-CM

## 2017-07-30 DIAGNOSIS — R222 Localized swelling, mass and lump, trunk: Secondary | ICD-10-CM

## 2017-07-30 DIAGNOSIS — M6281 Muscle weakness (generalized): Secondary | ICD-10-CM

## 2017-07-30 DIAGNOSIS — Z483 Aftercare following surgery for neoplasm: Secondary | ICD-10-CM | POA: Diagnosis not present

## 2017-07-30 NOTE — Therapy (Signed)
[place], [place], [address] Phone: [phone]   Fax:  [phone]  Physical Therapy Treatment  Patient Details  Name: Erin Fields MRN: [id number] Date of Birth: March 15, 1966 Referring Provider: Dr. [name]    Encounter Date: 07/30/2017  PT End of Session - 07/30/17 1617    Visit Number  8    Number of Visits  13    Date for PT Re-Evaluation  08/20/17    PT Start Time  1433    PT Stop Time  1513    PT Time Calculation (min)  40 min    Activity Tolerance  Patient tolerated treatment well    Behavior During Therapy  [facility] for tasks assessed/performed       Past Medical History:  Diagnosis Date  . Allergy   . Anxiety   . Asthma   . Cancer [facility])    right breast cancer  . Chicken pox   . Colon polyps   . Depression   . Elevated blood pressure   . Environmental allergies   . Family history of cancer   . Frequent headaches   . GERD (gastroesophageal reflux disease)   . Hay fever   . Hyperlipidemia   . Hypertension   . Hypothyroidism   . Migraines   . Personal history of radiation therapy   . PONV (postoperative nausea and vomiting)   . Thyroid disease   . Ulcerative colitis ([place])   . Varicose veins     Past Surgical History:  Procedure Laterality Date  . ABLATION  2011  . BREAST LUMPECTOMY Right   . BREAST LUMPECTOMY WITH RADIOACTIVE SEED AND SENTINEL LYMPH NODE BIOPSY Right 04/24/2017   Procedure: RIGHT BREAST LUMPECTOMY WITH RADIOACTIVE SEED AND SENTINEL LYMPH NODE BIOPSY;  Surgeon: [name], MD;  Location: [place];  Service: General;  Laterality: Right;  . FOOT SURGERY Right 2012  . HAND SURGERY Right   . RE-EXCISION OF BREAST LUMPECTOMY Right 05/15/2017   Procedure: RE-EXCISION OF BREAST LUMPECTOMY;  Surgeon: [name], MD;  Location: [place];  Service: General;  Laterality: Right;  . Septoplasty with turbinate reduction    . TONSILLECTOMY   1980  . VEIN SURGERY     Removal Right Leg  . WISDOM TOOTH EXTRACTION      There were no vitals filed for this visit.  Subjective Assessment - 07/30/17 1434    Subjective  The tape was okay.  No skin sensitivity.  Starting using rubbing alcohol upon removal with immediate shower.  Has been wearing the camisole every night.  Can tell a difference in more support    Pertinent History  right sided breast cancer diagnosed around 02/15/2017.  Had lumpectomy with 3 nodes removed on1/11/2017 with re-excision on 05/15/2017 and developed swelling after both surgeries and has had 2 aspiration since  Will not have to have chemo but will have radiation.  medical oncologist is Dr. Burr Medico  Past history includes asthma and arthritis in her knee     Patient Stated Goals  help with R breast and trunk pain and edema    Currently in Pain?  Yes    Pain Score  3     Pain Location  Scapula    Pain Orientation  Right    Pain Descriptors / Indicators  Tingling    Pain Type  Surgical pain    Pain Onset  More than a month ago    Pain Frequency  Intermittent    Aggravating Factors   Radiation    Pain Relieving Factors  manual lymph drainage    Effect of Pain on Daily Activities  has been too afraid to use it much          [facility] PT Assessment - 07/30/17 0001      AROM   Right Shoulder Flexion  160 Degrees with pull    Right Shoulder ABduction  155 Degrees pull    Right Shoulder External Rotation  90 Degrees      Strength   Right Shoulder Flexion  4/5    Right Shoulder ABduction  4-/5                   [facility] Adult PT Treatment/Exercise - 07/30/17 0001      Manual Therapy   Manual Therapy  Soft tissue mobilization    Soft tissue mobilization  antecubital fossa Rt, upper arm at cording locations     Myofascial Release  To Rt axilla into upper arm and towards the wrist    Manual Lymphatic Drainage (MLD)  Lt axillary, interaxillary anastamosis, Rt inguinal, Rt axillo-inguinal anastamosis in  supine, sidelying lateral trunk to inguinals Rt and inter axillary posterior in sidelying    Passive ROM  to tolerance flexion, abduction, ER      Kinesiotix   Edema  2 layers of Skin cote applied to right flank, then kinesiotape in bucket fan shape with bucket near right groin and fan fingers going up toward right axilla but stopped near inferior breast dur to irradiated tissue. pt to get own roll of tape and skin kote for self taping                  PT Long Term Goals - 07/23/17 1703      PT LONG TERM GOAL #1   Title  Pt will be independent in lymphedema risk reduction practices    Status  On-going      PT LONG TERM GOAL #2   Title  Pt will have > 125 degrees of painful shoulder abduction so that she can received radiation treatment     Status  On-going      PT LONG TERM GOAL #3   Title  Pt will be independent in a home exercise program for ROM and Strength of Upper body     Status  On-going      PT LONG TERM GOAL #4   Title  Pt will be indpendent in self manual lymph draiange and use of compression for right breast lymphedema     Status  On-going            Plan - 07/30/17 1617    Clinical Impression Statement  Pt reports less pulling in the Rt upper arm with the cording and now more in the antecuital fossa on the Rt.  Tension felt from the wrist into the Rt breast.  Able to use tape after last time without any skin irritation.  Pt plans on getting her own roll of tape and skin kote to learn self taping.  AROM and strength of the Rt shoulder greatly improved on assessment today.      Clinical Impairments Affecting Rehab Potential  seroma, 3 nodes removed, in mid-RT treatment    PT Frequency  2x / week    PT Duration  4 weeks    PT Treatment/Interventions  ADLs/Self Care Home Management;Taping;Scar mobilization;Passive range of motion;Compression bandaging;Manual lymph drainage;Manual techniques;Patient/family education;Therapeutic  exercise;Therapeutic activities     PT Next Visit Plan  Pt to continue with PT at this time for continued work on MLD, taping as tolerated only x 1 day and with skin-cote applied, shoulder ROM and release work for Rt axillary cording, TE as necessary       Patient will benefit from skilled therapeutic intervention in order to improve the following deficits and impairments:  Decreased knowledge of use of DME, Pain, Postural dysfunction, Decreased range of motion, Decreased strength, Impaired perceived functional ability, Obesity, Increased edema, Decreased knowledge of precautions  Visit Diagnosis: Aftercare following surgery for neoplasm  Localized swelling, mass and lump, trunk  Acute pain of right shoulder  Muscle weakness (generalized)  Stiffness of right shoulder, not elsewhere classified     Problem List Patient Active Problem List   Diagnosis Date Noted  . Genetic testing 07/16/2017  . Family history of cancer   . Controlled type 2 diabetes mellitus without complication, without long-term current use of insulin ([place]) 05/28/2017  . Malignant neoplasm of upper-outer quadrant of right breast in female, estrogen receptor positive ([place]) 03/20/2017  . Crohn disease ([place]) 11/29/2016  . Borderline diabetes 12/17/2015  . Depression 12/17/2015  . Ex-smoker 11/18/2015  . Chronic venous insufficiency 09/02/2015  . Symptomatic varicose veins, right 09/02/2015  . Eustachian tube dysfunction 12/04/2014  . Allergic rhinitis 09/15/2014  . Dyspnea 09/15/2014  . Liver hemangioma 08/24/2014  . Elevated triglycerides with high cholesterol 11/26/2013  . Insomnia 08/20/2013  . Hypertension 08/20/2013  . Ulcerative colitis, unspecified 04/20/2013  . Oral herpes 04/20/2013  . Hyperlipidemia 12/06/2006  . HEADACHE 12/06/2006  . CERVICAL CANCER, HX OF 12/06/2006  . Hypothyroidism 11/29/2006  . Asthma 11/29/2006  . IRRITABLE BOWEL SYNDROME 11/29/2006   07/30/2017, 4:21 PM   [place], [place], [address] Phone: [phone]   Fax:  [phone]  Name: Erin Fields MRN: [id number] Date of Birth: 08/06/1965

## 2017-07-31 ENCOUNTER — Ambulatory Visit
Admission: RE | Admit: 2017-07-31 | Discharge: 2017-07-31 | Disposition: A | Discharge status: Home / Self Care | Payer: BLUE CROSS/BLUE SHIELD | Source: Ambulatory Visit | Attending: Radiation Oncology | Admitting: Radiation Oncology

## 2017-07-31 ENCOUNTER — Ambulatory Visit: Payer: BLUE CROSS/BLUE SHIELD | Admitting: Radiation Oncology

## 2017-07-31 DIAGNOSIS — C50411 Malignant neoplasm of upper-outer quadrant of right female breast: Secondary | ICD-10-CM | POA: Diagnosis not present

## 2017-08-01 ENCOUNTER — Ambulatory Visit
Admission: RE | Admit: 2017-08-01 | Discharge: 2017-08-01 | Disposition: A | Discharge status: Home / Self Care | Payer: BLUE CROSS/BLUE SHIELD | Source: Ambulatory Visit | Attending: Radiation Oncology | Admitting: Radiation Oncology

## 2017-08-01 ENCOUNTER — Ambulatory Visit: Payer: BLUE CROSS/BLUE SHIELD | Admitting: Rehabilitation

## 2017-08-01 ENCOUNTER — Encounter: Payer: Self-pay | Admitting: Rehabilitation

## 2017-08-01 DIAGNOSIS — M25511 Pain in right shoulder: Secondary | ICD-10-CM

## 2017-08-01 DIAGNOSIS — M25611 Stiffness of right shoulder, not elsewhere classified: Secondary | ICD-10-CM

## 2017-08-01 DIAGNOSIS — Z483 Aftercare following surgery for neoplasm: Secondary | ICD-10-CM

## 2017-08-01 DIAGNOSIS — R222 Localized swelling, mass and lump, trunk: Secondary | ICD-10-CM

## 2017-08-01 DIAGNOSIS — C50411 Malignant neoplasm of upper-outer quadrant of right female breast: Secondary | ICD-10-CM | POA: Diagnosis not present

## 2017-08-01 DIAGNOSIS — M6281 Muscle weakness (generalized): Secondary | ICD-10-CM

## 2017-08-01 NOTE — Therapy (Signed)
[place], [place], [address] Phone: [phone]   Fax:  [phone]  Physical Therapy Treatment  Patient Details  Name: Erin Fields MRN: [id number] Date of Birth: 09-Feb-1966 Referring Provider: Dr. [name]    Encounter Date: 08/01/2017  PT End of Session - 08/01/17 1513    Visit Number  9    Number of Visits  13    Date for PT Re-Evaluation  08/20/17    PT Start Time  1433    PT Stop Time  1512    PT Time Calculation (min)  39 min    Activity Tolerance  Patient tolerated treatment well;Patient limited by pain    Behavior During Therapy  [facility] for tasks assessed/performed       Past Medical History:  Diagnosis Date  . Allergy   . Anxiety   . Asthma   . Cancer [facility])    right breast cancer  . Chicken pox   . Colon polyps   . Depression   . Elevated blood pressure   . Environmental allergies   . Family history of cancer   . Frequent headaches   . GERD (gastroesophageal reflux disease)   . Hay fever   . Hyperlipidemia   . Hypertension   . Hypothyroidism   . Migraines   . Personal history of radiation therapy   . PONV (postoperative nausea and vomiting)   . Thyroid disease   . Ulcerative colitis ([place])   . Varicose veins     Past Surgical History:  Procedure Laterality Date  . ABLATION  2011  . BREAST LUMPECTOMY Right   . BREAST LUMPECTOMY WITH RADIOACTIVE SEED AND SENTINEL LYMPH NODE BIOPSY Right 04/24/2017   Procedure: RIGHT BREAST LUMPECTOMY WITH RADIOACTIVE SEED AND SENTINEL LYMPH NODE BIOPSY;  Surgeon: [name], MD;  Location: [place];  Service: General;  Laterality: Right;  . FOOT SURGERY Right 2012  . HAND SURGERY Right   . RE-EXCISION OF BREAST LUMPECTOMY Right 05/15/2017   Procedure: RE-EXCISION OF BREAST LUMPECTOMY;  Surgeon: [name], MD;  Location: [place];  Service: General;  Laterality: Right;  . Septoplasty with turbinate  reduction    . TONSILLECTOMY  1980  . VEIN SURGERY     Removal Right Leg  . WISDOM TOOTH EXTRACTION      There were no vitals filed for this visit.  Subjective Assessment - 08/01/17 1436    Subjective  I'm just hurting today.  The Rt breast is swollen and painful again. Will be getting mammogram and [place] Monday to check fluid.  The pain is under the Rt breast and lateral.  Wants to leave it alone and just do the arm today.      Pertinent History  right sided breast cancer diagnosed around 02/15/2017.  Had lumpectomy with 3 nodes removed on1/11/2017 with re-excision on 05/15/2017 and developed swelling after both surgeries and has had 2 aspiration since  Will not have to have chemo but will have radiation.  medical oncologist is Dr. Burr Medico  Past history includes asthma and arthritis in her knee     Patient Stated Goals  help with R breast and trunk pain and edema    Currently in Pain?  Yes    Pain Score  3     Pain Location  Axilla    Pain Orientation  Right    Pain Descriptors / Indicators  Tingling    Pain Type  Surgical pain  Pain Onset  More than a month ago    Pain Frequency  Intermittent    Aggravating Factors   Radiation    Pain Relieving Factors  MLD         [facility] PT Assessment - 08/01/17 0001      Observation/Other Assessments   Observations  Rt breast more full in seated laterally and inferiorly                    [facility] Adult PT Treatment/Exercise - 08/01/17 0001      Manual Therapy   Soft tissue mobilization  antecubital fossa Rt, upper arm at cording locations     Myofascial Release  To Rt axilla into upper arm and towards the wrist    Passive ROM  to tolerance flexion, abduction, ER, wrist flexion and extension stretches             PT Education - 08/01/17 1513    Education provided  Yes    Education Details  median nerve/elbow wrist stretch    Person(s) Educated  Patient    Methods  Explanation;Demonstration    Comprehension  Verbalized  understanding          PT Long Term Goals - 08/01/17 1516      PT LONG TERM GOAL #1   Title  Pt will be independent in lymphedema risk reduction practices    Status  On-going      PT LONG TERM GOAL #2   Title  Pt will have > 125 degrees of painful shoulder abduction so that she can received radiation treatment     Status  On-going      PT LONG TERM GOAL #3   Title  Pt will be independent in a home exercise program for ROM and Strength of Upper body     Status  On-going      PT LONG TERM GOAL #4   Title  Pt will be indpendent in self manual lymph draiange and use of compression for right breast lymphedema     Status  On-going            Plan - 08/01/17 1514    Clinical Impression Statement  Pt arrives today with increased Rt breast pain and edema.  She will be getting a mammogram and [place] to see if the swelling [place] needs to be drained again.  Pt request to just work on the [place] UE today.  Treatment focused on Rt UE cording release and ROM and skin puckering still evident from the mid upper arm into the antecubital fossa and forearm extensor mass.  Able to feel the cording more distal today as the proximal arm has loosened up with PT so far.  Pt will be gone x 1 week from now.      Clinical Impairments Affecting Rehab Potential  seroma, 3 nodes removed, in mid-RT treatment    PT Frequency  2x / week    PT Duration  4 weeks    PT Treatment/Interventions  ADLs/Self Care Home Management;Taping;Scar mobilization;Passive range of motion;Compression bandaging;Manual lymph drainage;Manual techniques;Patient/family education;Therapeutic exercise;Therapeutic activities    PT Next Visit Plan  Pt to continue with PT at this time for continued work on MLD, taping as tolerated only x 1 day and with skin-cote applied, shoulder ROM and release work for Rt axillary cording, TE as necessary    PT Home Exercise Plan  supine pectoralis horizontal abduction stretch today, median nerve wall glide  given 4/17  Consulted and Agree with Plan of Care  Patient       Patient will benefit from skilled therapeutic intervention in order to improve the following deficits and impairments:  Decreased knowledge of use of DME, Pain, Postural dysfunction, Decreased range of motion, Decreased strength, Impaired perceived functional ability, Obesity, Increased edema, Decreased knowledge of precautions  Visit Diagnosis: Aftercare following surgery for neoplasm  Localized swelling, mass and lump, trunk  Acute pain of right shoulder  Muscle weakness (generalized)  Stiffness of right shoulder, not elsewhere classified     Problem List Patient Active Problem List   Diagnosis Date Noted  . Genetic testing 07/16/2017  . Family history of cancer   . Controlled type 2 diabetes mellitus without complication, without long-term current use of insulin ([place]) 05/28/2017  . Malignant neoplasm of upper-outer quadrant of right breast in female, estrogen receptor positive ([place]) 03/20/2017  . Crohn disease ([place]) 11/29/2016  . Borderline diabetes 12/17/2015  . Depression 12/17/2015  . Ex-smoker 11/18/2015  . Chronic venous insufficiency 09/02/2015  . Symptomatic varicose veins, right 09/02/2015  . Eustachian tube dysfunction 12/04/2014  . Allergic rhinitis 09/15/2014  . Dyspnea 09/15/2014  . Liver hemangioma 08/24/2014  . Elevated triglycerides with high cholesterol 11/26/2013  . Insomnia 08/20/2013  . Hypertension 08/20/2013  . Ulcerative colitis, unspecified 04/20/2013  . Oral herpes 04/20/2013  . Hyperlipidemia 12/06/2006  . HEADACHE 12/06/2006  . CERVICAL CANCER, HX OF 12/06/2006  . Hypothyroidism 11/29/2006  . Asthma 11/29/2006  . IRRITABLE BOWEL SYNDROME 11/29/2006    Shan Levans, PT 08/01/2017, 3:17 PM  [place], [place], [address] Phone: [phone]   Fax:  [phone]  Name: TALIANA MERSEREAU MRN:  [id number] Date of Birth: 05-25-1965

## 2017-08-02 ENCOUNTER — Ambulatory Visit
Admission: RE | Admit: 2017-08-02 | Discharge: 2017-08-02 | Disposition: A | Discharge status: Home / Self Care | Payer: BLUE CROSS/BLUE SHIELD | Source: Ambulatory Visit | Attending: Radiation Oncology | Admitting: Radiation Oncology

## 2017-08-02 DIAGNOSIS — C50411 Malignant neoplasm of upper-outer quadrant of right female breast: Secondary | ICD-10-CM | POA: Diagnosis not present

## 2017-08-03 ENCOUNTER — Ambulatory Visit
Admission: RE | Admit: 2017-08-03 | Discharge: 2017-08-03 | Disposition: A | Discharge status: Home / Self Care | Payer: BLUE CROSS/BLUE SHIELD | Source: Ambulatory Visit | Attending: Radiation Oncology | Admitting: Radiation Oncology

## 2017-08-03 DIAGNOSIS — C50411 Malignant neoplasm of upper-outer quadrant of right female breast: Secondary | ICD-10-CM | POA: Diagnosis not present

## 2017-08-03 NOTE — Progress Notes (Signed)
[place]  [phone] [phone]  Clinic Follow Up Note   Patient Care Team: Copland, Gay Filler, MD as PCP - General (Family Medicine) [name], MD as Referring Physician (Obstetrics and Gynecology) [name], MD as Consulting Physician (Gastroenterology) [name], MD as Referring Physician (Allergy) [name], MD as Consulting Physician (General Surgery) [name], MD as Consulting Physician (Radiation Oncology) [name], MD as Consulting Physician (Hematology)   Date of Service:  08/06/2017  CHIEF COMPLAINTS:  Follow up Malignant neoplasm of upper-outer quadrant of right breast   Oncology History   Cancer Staging Malignant neoplasm of upper-outer quadrant of right breast in female, estrogen receptor positive ([place]) Staging form: Breast, AJCC 8th Edition - Clinical stage from 03/16/2017: Stage IA (cT1b, cN0, cM0, G2, ER: Positive, PR: Positive, HER2: Negative) - Signed by [name], MD on 03/28/2017 - Pathologic stage from 05/15/2017: Stage IA (pT2, pN0, cM0, G2, ER+, PR+, HER2-, Oncotype DX score: 3) - Signed by [name], MD on 08/06/2017       Malignant neoplasm of upper-outer quadrant of right breast in female, estrogen receptor positive ([place])   03/15/2017 Mammogram    Diagnostic Mammogram Right Breast  IMPRESSION: Palpable abnormality corresponds to distortion and focal mass associated acoustic shadowing measuring 0.8x0.9x0.9cm in the 930 o'clock location of the right breast 7 cm from the nipple.   RECOMMENDATION: Ultrasound-guided core biopsy is recommended and scheduled for the patient.      03/16/2017 Initial Biopsy    Biopsy Results  Diagnosis 03/16/17 Breast, right, needle core biopsy, 9:30 o'clock, ribbon clip placed - INVASIVE MAMMARY CARCINOMA. - SEE COMMENT.       03/16/2017 Receptors her2    Estrogen Receptor: 95%, POSITIVE, STRONG STAINING INTENSITY Progesterone Receptor: 95%, POSITIVE,  STRONG STAINING INTENSITY Proliferation Marker Ki67: 3% HER2: NEGATIVE      03/20/2017 Initial Diagnosis    Malignant neoplasm of upper-outer quadrant of right breast in female, estrogen receptor positive ([place])      04/24/2017 Surgery    Right Breast Lumpectomy with Sentinel Node Biopsy with Dr. [name]       04/24/2017 Pathology Results    Diagnosis 04/24/17 1. Breast, lumpectomy, Right w/seed - INVASIVE LOBULAR CARCINOMA, GRADE 2, SPANNING 2.2 CM. - LOBULAR NEOPLASIA (ATYPICAL LOBULAR HYPERPLASIA). - ANTERIOR SUPERIOR MARGIN IS BROADLY POSITIVE. - BIOPSY SITE. - SEE ONCOLOGY TABLE. 2. Breast, excision, Right additional Medial Margin - INVASIVE LOBULAR CARCINOMA. - LOBULAR CARCINOMA IN SITU. - INVASIVE CARCINOMA IS 0.3 CM FROM NEW MEDIAL MARGIN. 3. Lymph node, sentinel, biopsy, Right Axillary #1 - ISOLATED TUMOR CELLS IN ONE OF ONE LYMPH NODES (0/1). 4. Lymph node, sentinel, biopsy, Right Axillary #2 - ONE OF ONE LYMPH NODES NEGATIVE FOR CARCINOMA (0/1). 5. Lymph node, sentinel, biopsy, Right Axillary #3 - ONE OF ONE LYMPH NODES NEGATIVE FOR CARCINOMA (0/1).      04/24/2017 Oncotype testing    Her Oncotyple recurrence score was 3, her distant recurrence score with Tamoxifen was 3% and her adjuvant chemotherapy benefit was <1%.       05/15/2017 Surgery    Re-excision of Right Breast Lumpectomy with Dr. [name]       05/15/2017 Pathology Results    Diagnosis 05/15/17 Breast, excision, Right additional anterior margin - INVASIVE LOBULAR CARCINOMA. - ATYPICAL LOBULAR HYPERPLASIA. - THE NEW ANTERIOR SURGICAL RESECTION MARGIN IS NEGATIVE FOR CARCINOMA.      05/15/2017 Cancer Staging    Staging form: Breast, AJCC 8th Edition - Pathologic stage from 05/15/2017:  Stage IA (pT2, pN0, cM0, G2, ER+, PR+, HER2-, Oncotype DX score: 3) - Signed by [name], MD on 08/06/2017      06/26/2017 -  Radiation Therapy    Adjuvant RT with Dr. [name]      07/12/2017 Genetic Testing    Negative  genetic testing on the multi-cancer panel.  The Multi-Gene Panel offered by [name] includes sequencing and/or deletion duplication testing of the following 83 genes: ALK, APC, ATM, AXIN2,BAP1,  BARD1, BLM, BMPR1A, BRCA1, BRCA2, BRIP1, CASR, CDC73, CDH1, CDK4, CDKN1B, CDKN1C, CDKN2A (p14ARF), CDKN2A (p16INK4a), CEBPA, CHEK2, CTNNA1, DICER1, DIS3L2, EGFR (c.2369C>T, p.Thr790Met variant only), EPCAM (Deletion/duplication testing only), FH, FLCN, GATA2, GPC3, GREM1 (Promoter region deletion/duplication testing only), HOXB13 (c.251G>A, p.Gly84Glu), HRAS, KIT, MAX, MEN1, MET, MITF (c.952G>A, p.Glu318Lys variant only), MLH1, MSH2, MSH3, MSH6, MUTYH, NBN, NF1, NF2, NTHL1, PALB2, PDGFRA, PHOX2B, PMS2, POLD1, POLE, POT1, PRKAR1A, PTCH1, PTEN, RAD50, RAD51C, RAD51D, RB1, RECQL4, RET, RUNX1, SDHAF2, SDHA (sequence changes only), SDHB, SDHC, SDHD, SMAD4, SMARCA4, SMARCB1, SMARCE1, STK11, SUFU, TERT, TERT, TMEM127, TP53, TSC1, TSC2, VHL, WRN and WT1.  The report date is July 12, 2017.         HISTORY OF PRESENTING ILLNESS: 03/28/17 Erin Fields 52 y.o. female is a here because of newly diagnosed right breast cancer.The patient presents to the clinic today accompanied by her sister.  Her mass palpated by patient initially in late October. She felt slight pain. She initially thought it was from a cyst which she had several times before. The pain went away but the lump remained. She went to her GYN who did further work up and found her mass to be cancer. She feels tenderness and has bruising from biopsy.   Today she reports she lost her husband in a car accident in 2018 and lost her daughter to suicide from overdose in 2017. She takes Valium daily to help with her anxiety. This is managed by Dr. Lorelei Pont. She declined seeing a Education officer, museum from our clinic. She feels better after speaking with the doctors today. She notes to having occasional ankle swelling.   In the past the patient was diagnosed with multiple  comorbidities. She plans to have right knee surgery for her arthritis in the future. She takes aleve for pain. She had multiple surgeries on her hands and feet, ablations, a vein removed and nasal pathway surgery. Her mother had diffuse metastatic cancer at 42yo, and she does not know the primary tumor. Patient quit smoking in 1995. She had ablation in 2011 due to menorrhagia that she was having every 2 weeks. She had significant hot flashes and started hormonal replacement in 07/2016 that she stopped 2 weeks ago. She did not increase her Effexor as she says this would increase her blood pressure.   GYN HISTORY  Menarchal: 9 LMP: no period since endometrial ablation in 2011 Contraceptive: yes, age [age] HRT: Lopreeza, 8 months from 07/2016-02/2017 G3P2A1: 1 miscarriage, has 1 son and 1 deceased daughter.   CURRENT THERAPY: PENDING Anti-estrogen therapy with Anastrozole or Tamoxifen. Plan to start in 4 weeks.   INTERVAL HISTORY: JAIANA SHEFFER is here for follow up towards the end of her RT with Dr. [name]. She presents to the clinic today by herself. She reports she is doing well overall with RT. She states she had developed a seroma and she was started on doxycycline incase of infection. She noticed a decrease in size of the seroma since completed antibiotics. She had 2 mammograms to evaluate the area that  were normal.   On review of systems, pt denies any other complaints at this time. Pertinent positives are listed and detailed within the above HPI.   MEDICAL HISTORY:  Past Medical History:  Diagnosis Date  . Allergy   . Anxiety   . Asthma   . Cancer [facility])    right breast cancer  . Chicken pox   . Colon polyps   . Depression   . Elevated blood pressure   . Environmental allergies   . Family history of cancer   . Frequent headaches   . GERD (gastroesophageal reflux disease)   . Hay fever   . Hyperlipidemia   . Hypertension   . Hypothyroidism   . Migraines   . Personal history of  radiation therapy   . PONV (postoperative nausea and vomiting)   . Thyroid disease   . Ulcerative colitis ([place])   . Varicose veins     SURGICAL HISTORY: Past Surgical History:  Procedure Laterality Date  . ABLATION  2011  . BREAST BIOPSY Right 02/2017  . BREAST LUMPECTOMY Right 04/2017  . BREAST LUMPECTOMY WITH RADIOACTIVE SEED AND SENTINEL LYMPH NODE BIOPSY Right 04/24/2017   Procedure: RIGHT BREAST LUMPECTOMY WITH RADIOACTIVE SEED AND SENTINEL LYMPH NODE BIOPSY;  Surgeon: [name], MD;  Location: [place];  Service: General;  Laterality: Right;  . FOOT SURGERY Right 2012  . HAND SURGERY Right   . RE-EXCISION OF BREAST LUMPECTOMY Right 05/15/2017   Procedure: RE-EXCISION OF BREAST LUMPECTOMY;  Surgeon: [name], MD;  Location: [place];  Service: General;  Laterality: Right;  . Septoplasty with turbinate reduction    . TONSILLECTOMY  1980  . VEIN SURGERY     Removal Right Leg  . WISDOM TOOTH EXTRACTION      SOCIAL HISTORY: Social History   Socioeconomic History  . Marital status: Married    Spouse name: Not on file  . Number of children: Not on file  . Years of education: Not on file  . Highest education level: Not on file  Occupational History  . Not on file  Social Needs  . Financial resource strain: Not on file  . Food insecurity:    Worry: Not on file    Inability: Not on file  . Transportation needs:    Medical: Not on file    Non-medical: Not on file  Tobacco Use  . Smoking status: Former Smoker    Packs/day: 4.00    Years: 15.00    Pack years: 60.00    Types: Cigarettes    Last attempt to quit: 09/14/1993    Years since quitting: 23.9  . Smokeless tobacco: Never Used  . Tobacco comment: Quit >20 yrs ago  Substance and Sexual Activity  . Alcohol use: Yes    Comment: Drinks a couple every 2-3 months  . Drug use: No  . Sexual activity: Never    Comment: ablation  Lifestyle  . Physical activity:    Days per  week: Not on file    Minutes per session: Not on file  . Stress: Not on file  Relationships  . Social connections:    Talks on phone: Not on file    Gets together: Not on file    Attends religious service: Not on file    Active member of club or organization: Not on file    Attends meetings of clubs or organizations: Not on file    Relationship status: Not on file  .  Intimate partner violence:    Fear of current or ex partner: Not on file    Emotionally abused: Not on file    Physically abused: Not on file    Forced sexual activity: Not on file  Other Topics Concern  . Not on file  Social History Narrative  . Not on file    FAMILY HISTORY: Family History  Problem Relation Age of Onset  . Arthritis Mother 23       Deceased  . Breast cancer Mother   . Lung cancer Mother   . Hyperlipidemia Mother   . Heart disease Mother   . Hypertension Mother   . Diabetes Mother   . Crohn's disease Mother   . Diabetes Maternal Grandfather   . Heart disease Maternal Grandfather   . Heart attack Maternal Grandfather   . Leukemia Maternal Grandfather   . Colitis Sister   . Leukemia Cousin 3       mat cousin    ALLERGIES:  is allergic to ace inhibitors; aspirin; cefaclor; lisinopril; losartan potassium; oxycodone; and sulfonamide derivatives.  MEDICATIONS:  Current Outpatient Medications  Medication Sig Dispense Refill  . acyclovir (ZOVIRAX) 400 MG tablet Take 1 tablet (400 mg total) by mouth 3 (three) times daily as needed. 30 tablet 5  . azelastine (ASTELIN) 0.1 % nasal spray 2 sprays 2 (two) times daily.  6  . budesonide-formoterol (SYMBICORT) 160-4.5 MCG/ACT inhaler Inhale 2 puffs into the lungs 2 (two) times daily.    . diazepam (VALIUM) 2 MG tablet Take 1 tablet (2 mg total) by mouth every 6 (six) hours as needed for anxiety. Do not combine with ambien 30 tablet 1  . EPINEPHrine 0.3 mg/0.3 mL IJ SOAJ injection epinephrine 0.3 mg/0.3 mL injection, auto-injector  TK UTD PRN    .  hyaluronate sodium (RADIAPLEXRX) GEL Apply 1 application topically 2 (two) times daily.    . hyoscyamine (ANASPAZ) 0.125 MG TBDP disintergrating tablet hyoscyamine 0.125 mg disintegrating tablet    . lansoprazole (PREVACID) 30 MG capsule Take 30 mg by mouth daily at 12 noon.    . levalbuterol (XOPENEX HFA) 45 MCG/ACT inhaler Inhale 2 puffs into the lungs every 4 (four) hours as needed for wheezing (Q4-6 Hours PRN).    Marland Kitchen levocetirizine (XYZAL) 5 MG tablet levocetirizine 5 mg tablet  TK 1 T PO QD IN THE EVE    . levothyroxine (SYNTHROID, LEVOTHROID) 88 MCG tablet Take 1 tablet (88 mcg total) by mouth daily. 90 tablet 3  . promethazine (PHENERGAN) 25 MG tablet TAKE 1/2-1 TABLET DAILY AS NEEDED FOR NAUSEA 30 tablet 0  . rosuvastatin (CRESTOR) 10 MG tablet Take 1 tablet (10 mg total) by mouth daily. 30 tablet 11  . triamcinolone (NASACORT ALLERGY 24HR) 55 MCG/ACT AERO nasal inhaler Place 1 spray into the nose daily.    Marland Kitchen venlafaxine XR (EFFEXOR-XR) 150 MG 24 hr capsule TAKE 1 BY MOUTH EVERY DAY WITH BREAKFAST 90 capsule 3  . zolpidem (AMBIEN) 5 MG tablet TAKE 1 TABLET BY MOUTH EVERY DAY AT BEDTIME AS NEEDED. DO NOT COMBINE WITH ANY OTHER SEDATING MEDICATION 30 tablet 1  . chlorpheniramine-HYDROcodone (TUSSIONEX PENNKINETIC ER) 10-8 MG/5ML SUER Take 5 mLs by mouth every 12 (twelve) hours as needed for cough (cough). (Patient not taking: Reported on 08/06/2017) 140 mL 0  . mesalamine (LIALDA) 1.2 G EC tablet Take 2.4 g by mouth daily.    . montelukast (SINGULAIR) 10 MG tablet Take 10 mg by mouth at bedtime.     Marland Kitchen  non-metallic deodorant (ALRA) MISC Apply 1 application topically daily as needed.    Marland Kitchen olmesartan (BENICAR) 40 MG tablet Take 1 tablet daily 90 tablet 3  . predniSONE (DELTASONE) 10 MG tablet 6 TAB PO DAY 1 5 TAB PO DAY 2 4 TAB PO DAY 3 3 TAB PO DAY 4 2 TAB PO DAY 5 1 TAB PO DAY 6 (Patient not taking: Reported on 08/06/2017) 21 tablet 0   No current facility-administered medications for this  visit.     REVIEW OF SYSTEMS:   Constitutional: Denies fevers, chills or abnormal night sweats Eyes: Denies blurriness of vision, double vision or watery eyes Ears, nose, mouth, throat, and face: Denies mucositis or sore throat Respiratory: Denies cough, dyspnea or wheezes Cardiovascular: Denies palpitation, chest discomfort  Gastrointestinal:  Denies nausea, heartburn or change in bowel habits Skin: Denies abnormal skin rashes Lymphatics: Denies new lymphadenopathy or easy bruising Neurological:Denies numbness, tingling or new weaknesses MSK: (+) arthritis, especially in right knee Behavioral/Psych: Mood is stable, no new changes  All other systems were reviewed with the patient and are negative. Breast: (+) skin erythema secondary to radiation   PHYSICAL EXAMINATION: ECOG PERFORMANCE STATUS: 0 - Asymptomatic  Vitals:   08/06/17 1301  BP: (!) 141/87  Pulse: 89  Resp: 18  Temp: 97.7 F (36.5 C)  SpO2: 96%   Filed Weights   08/06/17 1301  Weight: 181 lb 1.6 oz (82.1 kg)    GENERAL:alert, no distress and comfortable SKIN: skin color, texture, turgor are normal, no rashes or significant lesions EYES: normal, conjunctiva are pink and non-injected, sclera clear OROPHARYNX:no exudate, no erythema and lips, buccal mucosa, and tongue normal  NECK: supple, thyroid normal size, non-tender, without nodularity LYMPH:  no palpable lymphadenopathy in the cervical, axillary or inguinal LUNGS: clear to auscultation and percussion with normal breathing effort HEART: regular rate & rhythm and no murmurs and no lower extremity edema ABDOMEN:abdomen soft, non-tender and normal bowel sounds Musculoskeletal:no cyanosis of digits and no clubbing  PSYCH: alert & oriented x 3 with fluent speech NEURO: no focal motor/sensory deficits Breast: (+) s/p right breast lumpectomy. She has diffuse skin erythema of the right breast. No skin peeling or ulcers. There is a mass in the 10 o'clock position in  the right breast corresponding to the mammographically know seroma  LABORATORY DATA:  I have reviewed the data as listed CBC Latest Ref Rng & Units 05/15/2017 03/28/2017 08/30/2015  WBC 3.9 - 10.3 10e3/uL - 9.6 8.9  Hemoglobin 12.0 - 15.0 g/dL 14.3 13.6 12.9  Hematocrit 34.8 - 46.6 % - 41.9 39.2  Platelets 145 - 400 10e3/uL - 341 360.0    CMP Latest Ref Rng & Units 03/28/2017 12/20/2016 08/21/2016  Glucose 70 - 140 mg/dl 160(H) 114(H) 99  BUN 7.0 - 26.0 mg/dL 7.0 8 12  Creatinine 0.6 - 1.1 mg/dL 0.8 0.73 0.64  Sodium 136 - 145 mEq/L 141 142 137  Potassium 3.5 - 5.1 mEq/L 3.7 4.2 3.9  Chloride 96 - 112 mEq/L - 106 101  CO2 22 - 29 mEq/L _0 Calcium 8.4 - 10.4 mg/dL 9.5 9.4 9.2  Total Protein 6.4 - 8.3 g/dL 6.9 - -  Total Bilirubin 0.20 - 1.20 mg/dL 0.65 - -  Alkaline Phos 40 - 150 U/L 64 - -  AST 5 - 34 U/L 25 - -  ALT 0 - 55 U/L 24 - -        PATHOLOGY  Diagnosis 05/15/17 Breast, excision, Right additional  anterior margin - INVASIVE LOBULAR CARCINOMA. - ATYPICAL LOBULAR HYPERPLASIA. - THE NEW ANTERIOR SURGICAL RESECTION MARGIN IS NEGATIVE FOR CARCINOMA.  Diagnosis 04/24/17 1. Breast, lumpectomy, Right w/seed - INVASIVE LOBULAR CARCINOMA, GRADE 2, SPANNING 2.2 CM. - LOBULAR NEOPLASIA (ATYPICAL LOBULAR HYPERPLASIA). - ANTERIOR SUPERIOR MARGIN IS BROADLY POSITIVE. - BIOPSY SITE. - SEE ONCOLOGY TABLE. 2. Breast, excision, Right additional Medial Margin - INVASIVE LOBULAR CARCINOMA. - LOBULAR CARCINOMA IN SITU. - INVASIVE CARCINOMA IS 0.3 CM FROM NEW MEDIAL MARGIN. 3. Lymph node, sentinel, biopsy, Right Axillary #1 - ISOLATED TUMOR CELLS IN ONE OF ONE LYMPH NODES (0/1). 4. Lymph node, sentinel, biopsy, Right Axillary #2 - ONE OF ONE LYMPH NODES NEGATIVE FOR CARCINOMA (0/1). 5. Lymph node, sentinel, biopsy, Right Axillary #3 - ONE OF ONE LYMPH NODES NEGATIVE FOR CARCINOMA (0/1). Microscopic Comment 1. BREAST, INVASIVE TUMOR Procedure: Right breast lumpectomy with  additional margin and right axillary sentinel lymph nodes. Laterality: Right. Tumor Size: Histologic Type: Grade: 2 Tubular Differentiation: 3 Nuclear Pleomorphism: 2 Mitotic Count: 1 Ductal Carcinoma in Situ (DCIS): Not identified. Extent of Tumor: Confined to breast parenchyma. Margins: Invasive carcinoma, distance from closest margin: Present at the anterior superior margin broadly. 1 of 3 FINAL for Dulude, Elliotte A ([id number]) Microscopic Comment(continued) DCIS, distance from closest margin: N/A. Regional Lymph Nodes: Number of Lymph Nodes Examined: 3 Number of Sentinel Lymph Nodes Examined: 3 Lymph Nodes with Macrometastases: 0 Lymph Nodes with Micrometastases: 0 Lymph Nodes with Isolated Tumor Cells: 1 Breast Prognostic Profile: Performed on biopsy [id number], see below. Will not be repeated. Estrogen Receptor: Positive, 95% strong staining. Progesterone Receptor: Positive, 95% strong staining. Her2: Negative (ratio 1.22). Ki-67: 3% Best tumor block for sendout testing: 1D Pathologic Stage Classification (pTNM, AJCC 8th Edition): Primary Tumor (pT): pT2 Regional Lymph Nodes (pN): pN0 (i+) Distant Metastases (pM): pMX Comments: Immunohistochemistry for pancytokeratin reveals tumor at the anterior superior margin and isolated tumor cells within one lymph node.  Biopsy Results  Diagnosis 03/16/17 Breast, right, needle core biopsy, 9:30 o'clock, ribbon clip placed - INVASIVE MAMMARY CARCINOMA. - SEE COMMENT. Microscopic Comment The carcinoma appears grade II. An E-cadherin stain and a breast prognostic profile will be performed and the results will be reported separately. The results were called to [facility] on 03/20/19/18. FLUORESCENCE IN-SITU HYBRIDIZATION Results: HER2 - NEGATIVE RATIO OF HER2/CEP17 SIGNALS 1.22 AVERAGE HER2 COPY NUMBER PER CELL 1.65 PROGNOSTIC INDICATORS Results: IMMUNOHISTOCHEMICAL AND MORPHOMETRIC ANALYSIS PERFORMED  MANUALLY Estrogen Receptor: 95%, POSITIVE, STRONG STAINING INTENSITY Progesterone Receptor: 95%, POSITIVE, STRONG STAINING INTENSITY Proliferation Marker Ki67: 3%  The malignant cells are negative for E-cadherin, supporting a lobular phenotype.  RADIOGRAPHIC STUDIES: I have personally reviewed the radiological images as listed and agreed with the findings in the report.  Diagnostic Mammogram 08/06/17 IMPRESSION: 1. No evidence of malignancy in the left breast. 2. Simple appearing postoperative seroma in the 10 o'clock position of the right breast measures 4.0 cm greatest diameter. 3. Resolving hematoma and/or fat necrosis at the site of palpable thickening 11 o'clock position periareolar where the patient states she had previous skin bruising.  Diagnostic Mammogram and [place] 07/18/17 IMPRESSION: Expected postsurgical seroma within the RIGHT breast at the 10 o'clock axis, measuring 4 cm, corresponding to the area of clinical concern. Patient states that the overlying skin redness has improved after starting antibiotics earlier in the week.  US Breast Ltd Uni Right Inc Axilla  Result Date: 08/06/2017 CLINICAL DATA:  52 year old patient presents for routine annual examination of the left breast  and ultrasound follow-up of the right breast for a probable postoperative seroma. She had a right lumpectomy in January 2019 for lobular carcinoma. Currently she is near the end of her radiation treatments and has an appointment with oncology this week. She tells me she has had fluid drained from a postoperative seroma, confirmed by reading Dr. Marlowe Aschoff most recent clinic note. The patient tells me today that she had some bruising in the periareolar upper outer right breast at the time of one of the percutaneous aspirations of the seroma. While the skin bruising has resolved, she feels some palpable firmness in the region of previous bruising. Currently, she has expected skin changes of the right breast  related to radiation therapy. She has no new concerns. EXAM: DIGITAL DIAGNOSTIC LEFT MAMMOGRAM WITH CAD AND TOMO ULTRASOUND RIGHT BREAST COMPARISON:  Left breast ultrasound July 18, 2017 and multiple prior mammograms. ACR Breast Density Category b: There are scattered areas of fibroglandular density. FINDINGS: There is no mass, architectural distortion, or suspicious microcalcification in the left breast to suggest malignancy. Mammographic images were processed with CAD. On physical exam, there is expected discoloration of the skin of the entire right breast consistent with current radiation therapy. There is a healed lumpectomy scar in the upper-outer quadrant of the right breast. I do palpate some focal thickening in the periareolar right breast upper-outer quadrant in the region where the patient states she had cutaneous bruising, now resolved, following a postoperative aspiration of the seroma in the surgery clinic. Targeted ultrasound is performed, showing at 10 o'clock position 7 cm from the nipple an oval lobulated simple fluid collection measuring 4.0 x 1.6 x 3.4 cm. There is no internal nodularity and there is no associated vascularity or hyperemia. Findings are consistent with a benign postoperative seroma. There is no edema in the surrounding tissue. In the region of previous skin bruising, where the patient feels some thickening at 11 o'clock position periareolar is some increased echogenicity within the superficial tissues with scattered small fluid pockets consistent with a resolving hematoma and/or fat necrosis. This area measures 3.2 x 1.3 x 1.3 cm. IMPRESSION: 1. No evidence of malignancy in the left breast. 2. Simple appearing postoperative seroma in the 10 o'clock position of the right breast measures 4.0 cm greatest diameter. 3. Resolving hematoma and/or fat necrosis at the site of palpable thickening 11 o'clock position periareolar where the patient states she had previous skin bruising.  RECOMMENDATION: Bilateral diagnostic mammogram is recommended in April of 2020. I have discussed the findings and recommendations with the patient. Results were also provided in writing at the conclusion of the visit. If applicable, a reminder letter will be sent to the patient regarding the next appointment. BI-RADS CATEGORY  2: Benign. Electronically Signed   By: Curlene Dolphin M.D.   On: 08/06/2017 09:35   US Breast Ltd Uni Right Inc Axilla  Result Date: 07/18/2017 CLINICAL DATA:  History of RIGHT breast cancer in 2018, status post breast conservation surgery in January of 2019. Patient describes new fullness and pain within the upper-outer quadrant of the RIGHT breast. Patient also describes recent skin redness and warmth in this same area for which the patient was started on antibiotics earlier in the week. EXAM: DIGITAL DIAGNOSTIC RIGHT MAMMOGRAM WITH CAD AND TOMO ULTRASOUND RIGHT BREAST COMPARISON:  Previous exam(s). Most recent bilateral screening mammogram dated 08/03/2016. ACR Breast Density Category b: There are scattered areas of fibroglandular density. FINDINGS: There is expected postsurgical changes in the upper-outer quadrant of the  RIGHT breast, including an expected postsurgical seroma which measures approximately 5 cm greatest dimension. There are no new dominant masses, suspicious calcifications or secondary signs of malignancy elsewhere within the RIGHT breast. Mammographic images were processed with CAD. On physical exam, there is fullness within the upper-outer quadrant of the RIGHT breast corresponding to the area of patient's breast pain, and corresponding to the area of seroma seen on mammogram. Patient states that the skin redness has improved after starting antibiotics on Monday. Targeted ultrasound is performed, evaluating the upper-outer quadrant as directed by the patient, showing an expected postsurgical seroma at the 10 o'clock axis, 7 cm from the nipple, measuring 4 cm greatest  dimension, corresponding to the area of patient's pain and corresponding to the mammographic findings. No additional solid or cystic mass is identified within the upper-outer quadrant of the RIGHT breast. IMPRESSION: Expected postsurgical seroma within the RIGHT breast at the 10 o'clock axis, measuring 4 cm, corresponding to the area of clinical concern. Patient states that the overlying skin redness has improved after starting antibiotics earlier in the week. RECOMMENDATION: 1. Follow-up RIGHT breast ultrasound in 3 weeks to ensure stability of the postsurgical seroma and to exclude recurrence of patient's clinical findings of skin redness and warmth after antibiotic therapy is completed (to exclude the possibility of developing abscess or infected seroma). 2. LEFT breast screening mammogram at the time of patient's follow up appointment (as patient will be due for annual screening mammogram after 08/03/2017). Patient is returning on April 22nd for the follow-up RIGHT breast ultrasound and LEFT screening mammogram. I have discussed the findings and recommendations with the patient. Results were also provided in writing at the conclusion of the visit. If applicable, a reminder letter will be sent to the patient regarding the next appointment. BI-RADS CATEGORY  2: Benign. Electronically Signed   By: Franki Cabot M.D.   On: 07/18/2017 16:27   Mm Diag Breast Tomo Uni Left  Result Date: 08/06/2017 CLINICAL DATA:  52 year old patient presents for routine annual examination of the left breast and ultrasound follow-up of the right breast for a probable postoperative seroma. She had a right lumpectomy in January 2019 for lobular carcinoma. Currently she is near the end of her radiation treatments and has an appointment with oncology this week. She tells me she has had fluid drained from a postoperative seroma, confirmed by reading Dr. Marlowe Aschoff most recent clinic note. The patient tells me today that she had some  bruising in the periareolar upper outer right breast at the time of one of the percutaneous aspirations of the seroma. While the skin bruising has resolved, she feels some palpable firmness in the region of previous bruising. Currently, she has expected skin changes of the right breast related to radiation therapy. She has no new concerns. EXAM: DIGITAL DIAGNOSTIC LEFT MAMMOGRAM WITH CAD AND TOMO ULTRASOUND RIGHT BREAST COMPARISON:  Left breast ultrasound July 18, 2017 and multiple prior mammograms. ACR Breast Density Category b: There are scattered areas of fibroglandular density. FINDINGS: There is no mass, architectural distortion, or suspicious microcalcification in the left breast to suggest malignancy. Mammographic images were processed with CAD. On physical exam, there is expected discoloration of the skin of the entire right breast consistent with current radiation therapy. There is a healed lumpectomy scar in the upper-outer quadrant of the right breast. I do palpate some focal thickening in the periareolar right breast upper-outer quadrant in the region where the patient states she had cutaneous bruising, now resolved, following a  postoperative aspiration of the seroma in the surgery clinic. Targeted ultrasound is performed, showing at 10 o'clock position 7 cm from the nipple an oval lobulated simple fluid collection measuring 4.0 x 1.6 x 3.4 cm. There is no internal nodularity and there is no associated vascularity or hyperemia. Findings are consistent with a benign postoperative seroma. There is no edema in the surrounding tissue. In the region of previous skin bruising, where the patient feels some thickening at 11 o'clock position periareolar is some increased echogenicity within the superficial tissues with scattered small fluid pockets consistent with a resolving hematoma and/or fat necrosis. This area measures 3.2 x 1.3 x 1.3 cm. IMPRESSION: 1. No evidence of malignancy in the left breast. 2. Simple  appearing postoperative seroma in the 10 o'clock position of the right breast measures 4.0 cm greatest diameter. 3. Resolving hematoma and/or fat necrosis at the site of palpable thickening 11 o'clock position periareolar where the patient states she had previous skin bruising. RECOMMENDATION: Bilateral diagnostic mammogram is recommended in April of 2020. I have discussed the findings and recommendations with the patient. Results were also provided in writing at the conclusion of the visit. If applicable, a reminder letter will be sent to the patient regarding the next appointment. BI-RADS CATEGORY  2: Benign. Electronically Signed   By: Curlene Dolphin M.D.   On: 08/06/2017 09:35   Mm Diag Breast Tomo Uni Right  Result Date: 07/18/2017 CLINICAL DATA:  History of RIGHT breast cancer in 2018, status post breast conservation surgery in January of 2019. Patient describes new fullness and pain within the upper-outer quadrant of the RIGHT breast. Patient also describes recent skin redness and warmth in this same area for which the patient was started on antibiotics earlier in the week. EXAM: DIGITAL DIAGNOSTIC RIGHT MAMMOGRAM WITH CAD AND TOMO ULTRASOUND RIGHT BREAST COMPARISON:  Previous exam(s). Most recent bilateral screening mammogram dated 08/03/2016. ACR Breast Density Category b: There are scattered areas of fibroglandular density. FINDINGS: There is expected postsurgical changes in the upper-outer quadrant of the RIGHT breast, including an expected postsurgical seroma which measures approximately 5 cm greatest dimension. There are no new dominant masses, suspicious calcifications or secondary signs of malignancy elsewhere within the RIGHT breast. Mammographic images were processed with CAD. On physical exam, there is fullness within the upper-outer quadrant of the RIGHT breast corresponding to the area of patient's breast pain, and corresponding to the area of seroma seen on mammogram. Patient states that the  skin redness has improved after starting antibiotics on Monday. Targeted ultrasound is performed, evaluating the upper-outer quadrant as directed by the patient, showing an expected postsurgical seroma at the 10 o'clock axis, 7 cm from the nipple, measuring 4 cm greatest dimension, corresponding to the area of patient's pain and corresponding to the mammographic findings. No additional solid or cystic mass is identified within the upper-outer quadrant of the RIGHT breast. IMPRESSION: Expected postsurgical seroma within the RIGHT breast at the 10 o'clock axis, measuring 4 cm, corresponding to the area of clinical concern. Patient states that the overlying skin redness has improved after starting antibiotics earlier in the week. RECOMMENDATION: 1. Follow-up RIGHT breast ultrasound in 3 weeks to ensure stability of the postsurgical seroma and to exclude recurrence of patient's clinical findings of skin redness and warmth after antibiotic therapy is completed (to exclude the possibility of developing abscess or infected seroma). 2. LEFT breast screening mammogram at the time of patient's follow up appointment (as patient will be due for annual screening  mammogram after 08/03/2017). Patient is returning on April 22nd for the follow-up RIGHT breast ultrasound and LEFT screening mammogram. I have discussed the findings and recommendations with the patient. Results were also provided in writing at the conclusion of the visit. If applicable, a reminder letter will be sent to the patient regarding the next appointment. BI-RADS CATEGORY  2: Benign. Electronically Signed   By: Franki Cabot M.D.   On: 07/18/2017 16:27    ASSESSMENT & PLAN:  Erin Fields is a 52 y.o. caucasian female with a history of HLD, GERD, Depression and anxiety, asthma, allergies, HTN, hypothyroidism, liver hemangioma, h/o cervical cancer in 2008, and Ulcerative colitis.   1. Malignant neoplasm of upper-outer quadrant of right breast, invasive  lobular carcinoma, stage IA, pT2N0M0, ER/PR: Positive, HER2 negative, Grade II --I discussed her surgical path result in details -the Oncotype Dx result was reviewed with her in details. Her Oncotyple recurrence score was 3, her distant recurrence score with Tamoxifen was 3% and there is no benefit of adjuvant chemotherapy.  The very low risk disease. -She began adjuvant RT with Dr. [name] on 06/26/17 and plans to complete on 08/09/17 -Given the strong ER and PR positivity, I do recommend adjuvant aromatase inhibitor or Tamoxifen to reduce her risk of cancer recurrence for a total of 7-10 years, due to lobular histology. The potential benefit and side effects, which includes but not limited to, hot flash, skin and vaginal dryness, metabolic changes ( increased blood glucose, cholesterol, weight, etc.), slightly in increased risk of cardiovascular disease, cataracts, muscular and joint discomfort, osteopenia and osteoporosis, etc, and the risk of thrombosis and endometrial cancer from tamoxifen, were discussed with her in great detail and she is interested. She stopped hormonal replacement Lopreeza after 8 months of use in Fall 2018. Her menopausal status is unknown due to her prior endometrial ablation in 2011. Will check her [place], estrogen level and bone density before starting adjuvant antiestrogen therapy with Anastrozole or Tamoxifen. -Pt had a mammogram done on 07/18/17 and 08/06/17 to evaluate a seroma. Results showed no evidence of malignancy. Next mammogram will be due in April 2020.  -We also discussed the breast cancer surveillance after her surgery. She will continue annual screening mammogram, self exam, and a routine office visit with lab and exam with [place]. -I encouraged her to have healthy diet and exercise regularly -Survivorship clinic in 3 months  -F/u in 6 months    2. Genetic Testing -She is not a strong candidate for genetics given her unknown family history of cancer. She is insistent on  doing genetic testing for herself and her son.  -I sent genetic referral  -Genetic testing was negative  3. Anxiety  -Given her recent loss of husband and daughter I offered social work referral. She declined at this time.  -She is currently on Valium daily managed by Dr. Lorelei Pont  4. Bone Health  -I discussed how long term use of aromatase inhibitor can potentially weaken the bones -She has not had a bone density scan since 2007. I will order for her today to be done at [facility] after she completes RT -Pending results, we will choose her anti-estrogen therapy afterwards   PLAN:  -FSH and estrogen level today, will call with results -Complete RT on 08/09/17 -DEXA in 2 weeks, I will call in Tamoxifine or Anastrozole afterwards based on lab and DEXA scan results  -Survivorship clinic in 3 months   -F/u with me in 6 months   Orders Placed  This Encounter  Procedures  . DG Bone Density    Standing Status:   Future    Standing Expiration Date:   08/06/2018    Order Specific Question:   Reason for Exam (SYMPTOM  OR DIAGNOSIS REQUIRED)    Answer:   screening    Order Specific Question:   Is the patient pregnant?    Answer:   No    Order Specific Question:   Preferred imaging location?    Answer:   [facility]  . Estradiol    Standing Status:   Future    Number of Occurrences:   1    Standing Expiration Date:   08/06/2018  . FSH-Follicle stimulating hormone    Standing Status:   Future    Number of Occurrences:   1    Standing Expiration Date:   08/06/2018    All questions were answered. The patient knows to call the clinic with any problems, questions or concerns. I spent 20 minutes counseling the patient face to face. The total time spent in the appointment was 25 minutes and more than 50% was on counseling.   This document serves as a record of services personally performed by [name], MD. It was created on her behalf by Theresia Bough, a trained medical scribe. The creation of this  record is based on the scribe's personal observations and the provider's statements to them.   I have reviewed the above documentation for accuracy and completeness, and I agree with the above.    [name], MD 08/06/2017

## 2017-08-06 ENCOUNTER — Ambulatory Visit
Admission: RE | Admit: 2017-08-06 | Discharge: 2017-08-06 | Disposition: A | Discharge status: Home / Self Care | Payer: BLUE CROSS/BLUE SHIELD | Source: Ambulatory Visit | Attending: Radiation Oncology | Admitting: Radiation Oncology

## 2017-08-06 ENCOUNTER — Inpatient Hospital Stay: Payer: BLUE CROSS/BLUE SHIELD

## 2017-08-06 ENCOUNTER — Encounter: Payer: Self-pay | Admitting: Hematology

## 2017-08-06 ENCOUNTER — Inpatient Hospital Stay: Payer: BLUE CROSS/BLUE SHIELD | Attending: Genetic Counselor | Admitting: Hematology

## 2017-08-06 ENCOUNTER — Telehealth: Payer: Self-pay | Admitting: Hematology

## 2017-08-06 ENCOUNTER — Other Ambulatory Visit: Payer: Self-pay | Admitting: Student

## 2017-08-06 ENCOUNTER — Ambulatory Visit
Admission: RE | Admit: 2017-08-06 | Discharge: 2017-08-06 | Disposition: A | Discharge status: Home / Self Care | Payer: BLUE CROSS/BLUE SHIELD | Source: Ambulatory Visit | Attending: Student | Admitting: Student

## 2017-08-06 ENCOUNTER — Ambulatory Visit: Payer: Self-pay

## 2017-08-06 VITALS — BP 141/87 | HR 89 | Temp 97.7°F | Resp 18 | Ht 63.0 in | Wt 181.1 lb

## 2017-08-06 DIAGNOSIS — Z170 Estrogen receptor positive status [ER+]: Secondary | ICD-10-CM | POA: Insufficient documentation

## 2017-08-06 DIAGNOSIS — N611 Abscess of the breast and nipple: Secondary | ICD-10-CM

## 2017-08-06 DIAGNOSIS — E2839 Other primary ovarian failure: Secondary | ICD-10-CM

## 2017-08-06 DIAGNOSIS — Z853 Personal history of malignant neoplasm of breast: Secondary | ICD-10-CM

## 2017-08-06 DIAGNOSIS — F419 Anxiety disorder, unspecified: Secondary | ICD-10-CM | POA: Diagnosis not present

## 2017-08-06 DIAGNOSIS — C50411 Malignant neoplasm of upper-outer quadrant of right female breast: Secondary | ICD-10-CM

## 2017-08-06 DIAGNOSIS — Z87891 Personal history of nicotine dependence: Secondary | ICD-10-CM | POA: Insufficient documentation

## 2017-08-06 NOTE — Telephone Encounter (Signed)
Gave pt avs and calendar with appts per 4/22 los.

## 2017-08-07 ENCOUNTER — Ambulatory Visit
Admission: RE | Admit: 2017-08-07 | Discharge: 2017-08-07 | Disposition: A | Discharge status: Home / Self Care | Payer: BLUE CROSS/BLUE SHIELD | Source: Ambulatory Visit | Attending: Radiation Oncology | Admitting: Radiation Oncology

## 2017-08-07 DIAGNOSIS — C50411 Malignant neoplasm of upper-outer quadrant of right female breast: Secondary | ICD-10-CM | POA: Diagnosis not present

## 2017-08-07 LAB — ESTRADIOL

## 2017-08-07 LAB — FOLLICLE STIMULATING HORMONE: FSH: 137 m[IU]/mL

## 2017-08-08 ENCOUNTER — Ambulatory Visit
Admission: RE | Admit: 2017-08-08 | Discharge: 2017-08-08 | Disposition: A | Discharge status: Home / Self Care | Payer: BLUE CROSS/BLUE SHIELD | Source: Ambulatory Visit | Attending: Radiation Oncology | Admitting: Radiation Oncology

## 2017-08-08 ENCOUNTER — Telehealth: Payer: Self-pay

## 2017-08-08 DIAGNOSIS — C50411 Malignant neoplasm of upper-outer quadrant of right female breast: Secondary | ICD-10-CM | POA: Diagnosis not present

## 2017-08-08 NOTE — Telephone Encounter (Signed)
-----   Message from [name], MD sent at 08/07/2017  7:48 AM EDT ----- Please let pt know the lab result shows that she is postmenopausal. Thanks  [name]  08/07/2017

## 2017-08-08 NOTE — Telephone Encounter (Signed)
Left message on patient's voicemail that her lab results show she is postmenopausal.

## 2017-08-09 ENCOUNTER — Ambulatory Visit
Admission: RE | Admit: 2017-08-09 | Discharge: 2017-08-09 | Disposition: A | Discharge status: Home / Self Care | Payer: BLUE CROSS/BLUE SHIELD | Source: Ambulatory Visit | Attending: Radiation Oncology | Admitting: Radiation Oncology

## 2017-08-09 DIAGNOSIS — C50411 Malignant neoplasm of upper-outer quadrant of right female breast: Secondary | ICD-10-CM | POA: Diagnosis not present

## 2017-08-13 ENCOUNTER — Ambulatory Visit
Admission: RE | Admit: 2017-08-13 | Discharge: 2017-08-13 | Disposition: A | Discharge status: Home / Self Care | Payer: BLUE CROSS/BLUE SHIELD | Source: Ambulatory Visit | Attending: Hematology | Admitting: Hematology

## 2017-08-13 ENCOUNTER — Encounter: Payer: Self-pay | Admitting: Radiation Oncology

## 2017-08-13 DIAGNOSIS — E2839 Other primary ovarian failure: Secondary | ICD-10-CM

## 2017-08-13 NOTE — Progress Notes (Signed)
  Radiation Oncology         [phone] ________________________________  Name: Erin Fields MRN: [id number]  Date: 08/13/2017  DOB: 1965/09/10  End of Treatment Note  Diagnosis:   52 y.o. female with malignant neoplasm of upper-outer quadrant of right breast in female, estrogen receptor positive ([place]) Staging form: Breast, AJCC 8th Edition - Clinical stage from 03/16/2017: Stage IA (cT1b, cN0, cM0, G2, ER: Positive, PR: Positive, HER2: Negative) - Pathologic Stage pT2, pN0 (i+), pMX     Indication for treatment:  Curative       Radiation treatment dates:   06/26/2017 - 08/09/2017  Site/dose:    Breast_Rt/ 50.4 Gy delivered in 28 fractions of 1.8 Gy Breast_Rt_Boost/ 10 Gy delivered in 5 fractions of 2 Gy  Beams/energy:    3D/ 10X, 6X 3D/ 10X, 6X  Narrative: The patient tolerated radiation treatment relatively well.   At the beginning of treatment she had 30 cc of fluid drained form her right breast. By the end of treatment she endorsed mild fatigue, hyperpigmentation and itchiness to her right breast. No significant skin breakdown.  Plan: The patient has completed radiation treatment. The patient will return to radiation oncology clinic for routine followup in one month. I advised them to call or return sooner if they have any questions or concerns related to their recovery or treatment.  -----------------------------------  Blair Promise, PhD, MD  This document serves as a record of services personally performed by Blair Promise, PhD, MD. It was created on his behalf by Margit Banda, a trained medical scribe. The creation of this record is based on the scribe's personal observations and the provider's statements to them. This document has been checked and approved by the attending provider.

## 2017-08-22 ENCOUNTER — Encounter: Payer: Self-pay | Admitting: Hematology

## 2017-08-22 ENCOUNTER — Ambulatory Visit: Payer: BLUE CROSS/BLUE SHIELD | Attending: General Surgery | Admitting: Physical Therapy

## 2017-08-22 DIAGNOSIS — M25611 Stiffness of right shoulder, not elsewhere classified: Secondary | ICD-10-CM

## 2017-08-22 DIAGNOSIS — M25511 Pain in right shoulder: Secondary | ICD-10-CM | POA: Diagnosis present

## 2017-08-22 DIAGNOSIS — R222 Localized swelling, mass and lump, trunk: Secondary | ICD-10-CM | POA: Insufficient documentation

## 2017-08-22 DIAGNOSIS — Z483 Aftercare following surgery for neoplasm: Secondary | ICD-10-CM | POA: Insufficient documentation

## 2017-08-22 DIAGNOSIS — M6281 Muscle weakness (generalized): Secondary | ICD-10-CM | POA: Diagnosis present

## 2017-08-22 NOTE — Therapy (Signed)
[place], [place], [address] Phone: [phone]   Fax:  [phone]  Physical Therapy Treatment  Patient Details  Name: Erin Fields MRN: [id number] Date of Birth: 07/23/1965 Referring Provider: Dr. [name]    Encounter Date: 08/22/2017  PT End of Session - 08/22/17 1722    Visit Number  10    Number of Visits  18    Date for PT Re-Evaluation  09/28/17    PT Start Time  1346    PT Stop Time  1430    PT Time Calculation (min)  44 min    Activity Tolerance  Patient tolerated treatment well    Behavior During Therapy  [facility] for tasks assessed/performed       Past Medical History:  Diagnosis Date  . Allergy   . Anxiety   . Asthma   . Cancer [facility])    right breast cancer  . Chicken pox   . Colon polyps   . Depression   . Elevated blood pressure   . Environmental allergies   . Family history of cancer   . Frequent headaches   . GERD (gastroesophageal reflux disease)   . Hay fever   . Hyperlipidemia   . Hypertension   . Hypothyroidism   . Migraines   . Personal history of radiation therapy   . PONV (postoperative nausea and vomiting)   . Thyroid disease   . Ulcerative colitis ([place])   . Varicose veins     Past Surgical History:  Procedure Laterality Date  . ABLATION  2011  . BREAST BIOPSY Right 02/2017  . BREAST LUMPECTOMY Right 04/2017  . BREAST LUMPECTOMY WITH RADIOACTIVE SEED AND SENTINEL LYMPH NODE BIOPSY Right 04/24/2017   Procedure: RIGHT BREAST LUMPECTOMY WITH RADIOACTIVE SEED AND SENTINEL LYMPH NODE BIOPSY;  Surgeon: [name], MD;  Location: [place];  Service: General;  Laterality: Right;  . FOOT SURGERY Right 2012  . HAND SURGERY Right   . RE-EXCISION OF BREAST LUMPECTOMY Right 05/15/2017   Procedure: RE-EXCISION OF BREAST LUMPECTOMY;  Surgeon: [name], MD;  Location: [place];  Service: General;  Laterality: Right;  . Septoplasty with  turbinate reduction    . TONSILLECTOMY  1980  . VEIN SURGERY     Removal Right Leg  . WISDOM TOOTH EXTRACTION      There were no vitals filed for this visit.  Subjective Assessment - 08/22/17 1349    Subjective  My skin is healing.  I saw Dr. [name] on Monday: she does not want the breast skin or anything around it touched.  Just work on the arm for two weeks; in two weeks, start working on learning how to drain the breast swelling. Finished radiation April 25th. I'm so happy!    Pertinent History  right sided breast cancer diagnosed around 02/15/2017.  Had lumpectomy with 3 nodes removed on1/11/2017 with re-excision on 05/15/2017 and developed swelling after both surgeries and has had 2 aspiration since  Will not have to have chemo but will have radiation.  medical oncologist is Dr. Burr Medico  Past history includes asthma and arthritis in her knee     Patient Stated Goals  help with R breast and trunk pain and edema    Currently in Pain?  Yes    Pain Score  2     Pain Location  Breast and arm    Pain Orientation  Right    Pain Descriptors / Indicators  Other (Comment) pulling in underarm; breast is just painful    Aggravating Factors   picking up a pack of paper at [employer]; breast hurts with trying to sleep    Pain Relieving Factors  getting off the right side when lying down         [facility] PT Assessment - 08/22/17 0001      AROM   Right Shoulder Flexion  155 Degrees pulling    Right Shoulder ABduction  154 Degrees pulling, but not as bad as before                   [facility] Adult PT Treatment/Exercise - 08/22/17 0001      Manual Therapy   Soft tissue mobilization  to cording at right antecubital fossa and at left medial upper arm one distinct pop felt and heard    Myofascial Release  right UE myofascial pulling with movement into abduction    Passive ROM  in supine for right shoulder into er, abduction, and flexion             PT Education - 08/22/17 1407     Education provided  Yes    Education Details  lymphedema risk reduction practices    Person(s) Educated  Patient    Methods  Explanation;Handout    Comprehension  Verbalized understanding          PT Long Term Goals - 08/22/17 1353      PT LONG TERM GOAL #1   Title  Pt will be independent in lymphedema risk reduction practices    Status  Achieved      PT LONG TERM GOAL #2   Title  Pt will have > 125 degrees of painful shoulder abduction so that she can received radiation treatment     Status  Achieved      PT LONG TERM GOAL #3   Title  Pt will be independent in a home exercise program for ROM and Strength of Upper body     Status  Partially Met      PT LONG TERM GOAL #4   Title  Pt will be indpendent in self manual lymph draiange and use of compression for right breast lymphedema     Status  On-going            Plan - 08/22/17 1722    Clinical Impression Statement  Pt. returned today a few weeks after her most recent visit here, having now completed radiation treatment.  She saw Dr. [name] earlier this week, and tells [place] that Dr. [name] would like to have two weeks without therapy that involves manual work on the breast area.  Focus today was on right arm cording and shoulder ROM.  Pt. had last just a few degrees of motion compared to measurements 07/30/17. There was one distinct and one small release of the cording felt during session today. After two weeks, she would like to learn more about manual lymph drainage for the breast area. Educated patient about lymphedema risk reduction practices to meet that goal today.    Rehab Potential  Good    Clinical Impairments Affecting Rehab Potential  seroma, 3 nodes removed    PT Frequency  2x / week    PT Duration  4 weeks    PT Treatment/Interventions  ADLs/Self Care Home Management;Taping;Scar mobilization;Passive range of motion;Compression bandaging;Manual lymph drainage;Manual techniques;Patient/family education;Therapeutic  exercise;Therapeutic activities    PT Next Visit Plan  Manual work for right  UE cording and right shoulder ROM    PT Home Exercise Plan  supine pectoralis horizontal abduction stretch, median nerve wall glide given 4/17    Consulted and Agree with Plan of Care  Patient       Patient will benefit from skilled therapeutic intervention in order to improve the following deficits and impairments:  Decreased knowledge of use of DME, Pain, Postural dysfunction, Decreased range of motion, Decreased strength, Impaired perceived functional ability, Obesity, Increased edema, Decreased knowledge of precautions  Visit Diagnosis: Aftercare following surgery for neoplasm - Plan: PT plan of care cert/re-cert  Acute pain of right shoulder - Plan: PT plan of care cert/re-cert  Stiffness of right shoulder, not elsewhere classified - Plan: PT plan of care cert/re-cert     Problem List Patient Active Problem List   Diagnosis Date Noted  . Genetic testing 07/16/2017  . Family history of cancer   . Controlled type 2 diabetes mellitus without complication, without long-term current use of insulin ([place]) 05/28/2017  . Malignant neoplasm of upper-outer quadrant of right breast in female, estrogen receptor positive ([place]) 03/20/2017  . Crohn disease ([place]) 11/29/2016  . Borderline diabetes 12/17/2015  . Depression 12/17/2015  . Ex-smoker 11/18/2015  . Chronic venous insufficiency 09/02/2015  . Symptomatic varicose veins, right 09/02/2015  . Eustachian tube dysfunction 12/04/2014  . Allergic rhinitis 09/15/2014  . Dyspnea 09/15/2014  . Liver hemangioma 08/24/2014  . Elevated triglycerides with high cholesterol 11/26/2013  . Insomnia 08/20/2013  . Hypertension 08/20/2013  . Ulcerative colitis, unspecified 04/20/2013  . Oral herpes 04/20/2013  . Hyperlipidemia 12/06/2006  . HEADACHE 12/06/2006  . CERVICAL CANCER, HX OF 12/06/2006  . Hypothyroidism 11/29/2006  . Asthma 11/29/2006  . IRRITABLE BOWEL  SYNDROME 11/29/2006    Lexx [place] 08/22/2017, 5:30 PM  [place], [place], [address] Phone: [phone]   Fax:  [phone]  Name: Erin Fields MRN: [id number] Date of Birth: [birth year]  Serafina Royals, PT 08/22/17 5:30 PM

## 2017-08-23 ENCOUNTER — Encounter: Payer: Self-pay | Admitting: Rehabilitation

## 2017-08-23 ENCOUNTER — Other Ambulatory Visit: Payer: Self-pay | Admitting: Hematology

## 2017-08-23 MED ORDER — LETROZOLE 2.5 MG PO TABS: 2.5000 mg | ORAL_TABLET | Freq: Every day | ORAL | 3 refills | Status: DC

## 2017-08-30 ENCOUNTER — Encounter: Payer: Self-pay | Admitting: Physical Therapy

## 2017-08-30 ENCOUNTER — Ambulatory Visit: Payer: BLUE CROSS/BLUE SHIELD | Admitting: Physical Therapy

## 2017-08-30 DIAGNOSIS — M6281 Muscle weakness (generalized): Secondary | ICD-10-CM

## 2017-08-30 DIAGNOSIS — M25611 Stiffness of right shoulder, not elsewhere classified: Secondary | ICD-10-CM

## 2017-08-30 DIAGNOSIS — R222 Localized swelling, mass and lump, trunk: Secondary | ICD-10-CM

## 2017-08-30 DIAGNOSIS — Z483 Aftercare following surgery for neoplasm: Secondary | ICD-10-CM

## 2017-08-30 DIAGNOSIS — M25511 Pain in right shoulder: Secondary | ICD-10-CM

## 2017-08-30 NOTE — Therapy (Signed)
[place], [place], [address] Phone: [phone]   Fax:  [phone]  Physical Therapy Treatment  Patient Details  Name: Erin Fields MRN: [id number] Date of Birth: 1966-04-09 Referring Provider: Dr. [name]    Encounter Date: 08/30/2017  PT End of Session - 08/30/17 1205    Visit Number  11    Number of Visits  18    Date for PT Re-Evaluation  09/28/17    PT Start Time  1103    PT Stop Time  1150    PT Time Calculation (min)  47 min    Activity Tolerance  Patient tolerated treatment well    Behavior During Therapy  [facility] for tasks assessed/performed       Past Medical History:  Diagnosis Date  . Allergy   . Anxiety   . Asthma   . Cancer [facility])    right breast cancer  . Chicken pox   . Colon polyps   . Depression   . Elevated blood pressure   . Environmental allergies   . Family history of cancer   . Frequent headaches   . GERD (gastroesophageal reflux disease)   . Hay fever   . Hyperlipidemia   . Hypertension   . Hypothyroidism   . Migraines   . Personal history of radiation therapy   . PONV (postoperative nausea and vomiting)   . Thyroid disease   . Ulcerative colitis ([place])   . Varicose veins     Past Surgical History:  Procedure Laterality Date  . ABLATION  2011  . BREAST BIOPSY Right 02/2017  . BREAST LUMPECTOMY Right 04/2017  . BREAST LUMPECTOMY WITH RADIOACTIVE SEED AND SENTINEL LYMPH NODE BIOPSY Right 04/24/2017   Procedure: RIGHT BREAST LUMPECTOMY WITH RADIOACTIVE SEED AND SENTINEL LYMPH NODE BIOPSY;  Surgeon: [name], MD;  Location: [place];  Service: General;  Laterality: Right;  . FOOT SURGERY Right 2012  . HAND SURGERY Right   . RE-EXCISION OF BREAST LUMPECTOMY Right 05/15/2017   Procedure: RE-EXCISION OF BREAST LUMPECTOMY;  Surgeon: [name], MD;  Location: [place];  Service: General;  Laterality: Right;  . Septoplasty with  turbinate reduction    . TONSILLECTOMY  1980  . VEIN SURGERY     Removal Right Leg  . WISDOM TOOTH EXTRACTION      There were no vitals filed for this visit.  Subjective Assessment - 08/30/17 1108    Subjective  [name] did something last time that broke something up and that helped alot and I got alot of relief.    Pertinent History  right sided breast cancer diagnosed around 02/15/2017.  Had lumpectomy with 3 nodes removed on1/11/2017 with re-excision on 05/15/2017 and developed swelling after both surgeries and has had 2 aspiration since  Will not have to have chemo but will have radiation.  medical oncologist is Dr. Burr Medico  Past history includes asthma and arthritis in her knee     Patient Stated Goals  help with R breast and trunk pain and edema    Currently in Pain?  Yes    Pain Score  2     Pain Location  Arm    Pain Orientation  Right    Pain Descriptors / Indicators  Tightness    Pain Type  Surgical pain    Pain Onset  More than a month ago    Pain Frequency  Intermittent  Katina Dung - 08/30/17 0001    Open a tight or new jar  Mild difficulty    Do heavy household chores (wash walls, wash floors)  Moderate difficulty    Carry a shopping bag or briefcase  Moderate difficulty    Wash your back  Moderate difficulty    Use a knife to cut food  No difficulty    Recreational activities in which you take some force or impact through your arm, shoulder, or hand (golf, hammering, tennis)  Unable    During the past week, to what extent has your arm, shoulder or hand problem interfered with your normal social activities with family, friends, neighbors, or groups?  Not at all    During the past week, to what extent has your arm, shoulder or hand problem limited your work or other regular daily activities  Slightly    Arm, shoulder, or hand pain.  Moderate    Tingling (pins and needles) in your arm, shoulder, or hand  None    Difficulty Sleeping  No difficulty    DASH  Score  31.82 %             [facility] Adult PT Treatment/Exercise - 08/30/17 0001      Manual Therapy   Manual Therapy  Soft tissue mobilization;Myofascial release;Neural Stretch;Passive ROM    Soft tissue mobilization  to cording at medial right upper arm and axilla in supine    Myofascial Release  right UE myofascial pulling with movement into abduction with external rotation    Passive ROM  in supine for right shoulder into er, abduction, and flexion                  PT Long Term Goals - 08/22/17 1353      PT LONG TERM GOAL #1   Title  Pt will be independent in lymphedema risk reduction practices    Status  Achieved      PT LONG TERM GOAL #2   Title  Pt will have > 125 degrees of painful shoulder abduction so that she can received radiation treatment     Status  Achieved      PT LONG TERM GOAL #3   Title  Pt will be independent in a home exercise program for ROM and Strength of Upper body     Status  Partially Met      PT LONG TERM GOAL #4   Title  Pt will be indpendent in self manual lymph draiange and use of compression for right breast lymphedema     Status  On-going            Plan - 08/30/17 1206    Clinical Impression Statement  Patient with increased ROM and reduced c/o pain per her report today due to being able to break cording last visit. She reports feeling deep cord present in proximal medial upper arm but that is not palpable to PT. There is thickness in that area that appeared to be reduced after visit today.     Rehab Potential  Good    Clinical Impairments Affecting Rehab Potential  seroma, 3 nodes removed    PT Frequency  2x / week    PT Duration  4 weeks    PT Treatment/Interventions  ADLs/Self Care Home Management;Taping;Scar mobilization;Passive range of motion;Compression bandaging;Manual lymph drainage;Manual techniques;Patient/family education;Therapeutic exercise;Therapeutic activities    PT Next Visit Plan  Manual work for right UE  cording and right shoulder ROM  Consulted and Agree with Plan of Care  Patient       Patient will benefit from skilled therapeutic intervention in order to improve the following deficits and impairments:  Decreased knowledge of use of DME, Pain, Postural dysfunction, Decreased range of motion, Decreased strength, Impaired perceived functional ability, Obesity, Increased edema, Decreased knowledge of precautions  Visit Diagnosis: Aftercare following surgery for neoplasm  Acute pain of right shoulder  Stiffness of right shoulder, not elsewhere classified  Localized swelling, mass and lump, trunk  Muscle weakness (generalized)     Problem List Patient Active Problem List   Diagnosis Date Noted  . Genetic testing 07/16/2017  . Family history of cancer   . Controlled type 2 diabetes mellitus without complication, without long-term current use of insulin ([place]) 05/28/2017  . Malignant neoplasm of upper-outer quadrant of right breast in female, estrogen receptor positive ([place]) 03/20/2017  . Crohn disease ([place]) 11/29/2016  . Borderline diabetes 12/17/2015  . Depression 12/17/2015  . Ex-smoker 11/18/2015  . Chronic venous insufficiency 09/02/2015  . Symptomatic varicose veins, right 09/02/2015  . Eustachian tube dysfunction 12/04/2014  . Allergic rhinitis 09/15/2014  . Dyspnea 09/15/2014  . Liver hemangioma 08/24/2014  . Elevated triglycerides with high cholesterol 11/26/2013  . Insomnia 08/20/2013  . Hypertension 08/20/2013  . Ulcerative colitis, unspecified 04/20/2013  . Oral herpes 04/20/2013  . Hyperlipidemia 12/06/2006  . HEADACHE 12/06/2006  . CERVICAL CANCER, HX OF 12/06/2006  . Hypothyroidism 11/29/2006  . Asthma 11/29/2006  . IRRITABLE BOWEL SYNDROME 11/29/2006    Annia Friendly, PT 08/30/17 12:12 PM  [place], [place], [address] Phone: [phone]   Fax:  [phone]  Name:  Erin Fields MRN: [id number] Date of Birth: 10-Oct-1965

## 2017-09-03 ENCOUNTER — Ambulatory Visit: Payer: BLUE CROSS/BLUE SHIELD | Admitting: Physical Therapy

## 2017-09-03 ENCOUNTER — Encounter: Payer: Self-pay | Admitting: Physical Therapy

## 2017-09-03 DIAGNOSIS — Z483 Aftercare following surgery for neoplasm: Secondary | ICD-10-CM | POA: Diagnosis not present

## 2017-09-03 DIAGNOSIS — M25611 Stiffness of right shoulder, not elsewhere classified: Secondary | ICD-10-CM

## 2017-09-03 DIAGNOSIS — M6281 Muscle weakness (generalized): Secondary | ICD-10-CM

## 2017-09-03 DIAGNOSIS — M25511 Pain in right shoulder: Secondary | ICD-10-CM

## 2017-09-03 NOTE — Patient Instructions (Signed)
Over Head Pull: Narrow and Wide Grip   Cancer Rehab [phone]   On back, knees bent, feet flat, band across thighs, elbows straight but relaxed. Pull hands apart (start). Keeping elbows straight, bring arms up and over head, hands toward floor. Keep pull steady on band. Hold momentarily. Return slowly, keeping pull steady, back to start. Then do same with a wider grip on the band (past shoulder width) Repeat _10__ times. Band color __yellow____   Side Pull: Double Arm   On back, knees bent, feet flat. Arms perpendicular to body, shoulder level, elbows straight but relaxed. Pull arms out to sides, elbows straight. Resistance band comes across collarbones, hands toward floor. Hold momentarily. Slowly return to starting position. Repeat _10__ times. Band color _yellow____   Sword   On back, knees bent, feet flat, left hand on left hip, right hand above left. Pull right arm DIAGONALLY (hip to shoulder) across chest. Bring right arm along head toward floor. Hold momentarily. Slowly return to starting position. Repeat _10__ times. Do with left arm. Band color _yellow_____   Shoulder Rotation: Double Arm   On back, knees bent, feet flat, elbows tucked at sides, bent 90, hands palms up. Pull hands apart and down toward floor, keeping elbows near sides. Hold momentarily. Slowly return to starting position. Repeat _10__ times. Band color __yellow____

## 2017-09-03 NOTE — Therapy (Signed)
[place], [place], [address] Phone: [phone]   Fax:  [phone]  Physical Therapy Treatment  Patient Details  Name: Erin Fields MRN: [id number] Date of Birth: 06/16/1965 Referring Provider: Dr. [name]    Encounter Date: 09/03/2017  PT End of Session - 09/03/17 1516    Visit Number  12    Number of Visits  18    Date for PT Re-Evaluation  09/28/17    PT Start Time  9242    PT Stop Time  1515    PT Time Calculation (min)  42 min    Activity Tolerance  Patient tolerated treatment well    Behavior During Therapy  [facility] for tasks assessed/performed       Past Medical History:  Diagnosis Date  . Allergy   . Anxiety   . Asthma   . Cancer [facility])    right breast cancer  . Chicken pox   . Colon polyps   . Depression   . Elevated blood pressure   . Environmental allergies   . Family history of cancer   . Frequent headaches   . GERD (gastroesophageal reflux disease)   . Hay fever   . Hyperlipidemia   . Hypertension   . Hypothyroidism   . Migraines   . Personal history of radiation therapy   . PONV (postoperative nausea and vomiting)   . Thyroid disease   . Ulcerative colitis ([place])   . Varicose veins     Past Surgical History:  Procedure Laterality Date  . ABLATION  2011  . BREAST BIOPSY Right 02/2017  . BREAST LUMPECTOMY Right 04/2017  . BREAST LUMPECTOMY WITH RADIOACTIVE SEED AND SENTINEL LYMPH NODE BIOPSY Right 04/24/2017   Procedure: RIGHT BREAST LUMPECTOMY WITH RADIOACTIVE SEED AND SENTINEL LYMPH NODE BIOPSY;  Surgeon: [name], MD;  Location: [place];  Service: General;  Laterality: Right;  . FOOT SURGERY Right 2012  . HAND SURGERY Right   . RE-EXCISION OF BREAST LUMPECTOMY Right 05/15/2017   Procedure: RE-EXCISION OF BREAST LUMPECTOMY;  Surgeon: [name], MD;  Location: [place];  Service: General;  Laterality: Right;  . Septoplasty with  turbinate reduction    . TONSILLECTOMY  1980  . VEIN SURGERY     Removal Right Leg  . WISDOM TOOTH EXTRACTION      There were no vitals filed for this visit.  Subjective Assessment - 09/03/17 1437    Subjective  My arm was really sore after it was worked on for several days. It is feeling more loose now.     Pertinent History  right sided breast cancer diagnosed around 02/15/2017.  Had lumpectomy with 3 nodes removed on1/11/2017 with re-excision on 05/15/2017 and developed swelling after both surgeries and has had 2 aspiration since  Will not have to have chemo but will have radiation.  medical oncologist is Dr. Burr Medico  Past history includes asthma and arthritis in her knee     Patient Stated Goals  help with R breast and trunk pain and edema    Currently in Pain?  No/denies    Pain Score  0-No pain                       [facility] Adult PT Treatment/Exercise - 09/03/17 0001      Shoulder Exercises: Supine   Horizontal ABduction  Strengthening;Both;10 reps;Theraband pt returned therapist demo    Theraband Level (Shoulder Horizontal  ABduction)  Level 1 (Yellow)    External Rotation  Strengthening;Both;10 reps;Theraband pt returned therapist demo    Theraband Level (Shoulder External Rotation)  Level 1 (Yellow)    Flexion  Strengthening;Both;10 reps;Theraband narrow and wide grip, pt returned therapist demo    Theraband Level (Shoulder Flexion)  Level 1 (Yellow)    Other Supine Exercises  D2 x 10 reps bilaterally with yellow band - pt returned therapist demo      Shoulder Exercises: Pulleys   Flexion  2 minutes with cues for upright posture    ABduction  2 minutes with cues for upright posture      Shoulder Exercises: Therapy Ball   Flexion  Both;10 reps with stretch at top- pt returned therapist demo      Manual Therapy   Manual Therapy  Soft tissue mobilization;Myofascial release;Neural Stretch;Passive ROM    Soft tissue mobilization  to cording at medial right upper arm  and axilla in supine    Myofascial Release  --    Passive ROM  in supine for right shoulder into er, abduction, and flexion                  PT Long Term Goals - 08/22/17 1353      PT LONG TERM GOAL #1   Title  Pt will be independent in lymphedema risk reduction practices    Status  Achieved      PT LONG TERM GOAL #2   Title  Pt will have > 125 degrees of painful shoulder abduction so that she can received radiation treatment     Status  Achieved      PT LONG TERM GOAL #3   Title  Pt will be independent in a home exercise program for ROM and Strength of Upper body     Status  Partially Met      PT LONG TERM GOAL #4   Title  Pt will be indpendent in self manual lymph draiange and use of compression for right breast lymphedema     Status  On-going            Plan - 09/03/17 1517    Clinical Impression Statement  Performed PROM today to pt's RUE and pt demonstrated full PROM. Her cording was barely visible and palpable. Spent some time working on stretching cording. Then added AAROM exercises which pt did very well with. Instructed pt in supine scapular strengthening exercises and issued them as part of pt's HEP.     Rehab Potential  Good    Clinical Impairments Affecting Rehab Potential  seroma, 3 nodes removed    PT Frequency  2x / week    PT Duration  4 weeks    PT Treatment/Interventions  ADLs/Self Care Home Management;Taping;Scar mobilization;Passive range of motion;Compression bandaging;Manual lymph drainage;Manual techniques;Patient/family education;Therapeutic exercise;Therapeutic activities    PT Next Visit Plan  assess indep with supine scap, assess goals, continue AA/A/PROM to R shoulder    PT Home Exercise Plan  supine pectoralis horizontal abduction stretch, median nerve wall glide given 4/17, supine scap series    Consulted and Agree with Plan of Care  Patient       Patient will benefit from skilled therapeutic intervention in order to improve the  following deficits and impairments:  Decreased knowledge of use of DME, Pain, Postural dysfunction, Decreased range of motion, Decreased strength, Impaired perceived functional ability, Obesity, Increased edema, Decreased knowledge of precautions  Visit Diagnosis: Aftercare following surgery for neoplasm  Acute pain of right shoulder  Stiffness of right shoulder, not elsewhere classified  Muscle weakness (generalized)     Problem List Patient Active Problem List   Diagnosis Date Noted  . Genetic testing 07/16/2017  . Family history of cancer   . Controlled type 2 diabetes mellitus without complication, without long-term current use of insulin ([place]) 05/28/2017  . Malignant neoplasm of upper-outer quadrant of right breast in female, estrogen receptor positive ([place]) 03/20/2017  . Crohn disease ([place]) 11/29/2016  . Borderline diabetes 12/17/2015  . Depression 12/17/2015  . Ex-smoker 11/18/2015  . Chronic venous insufficiency 09/02/2015  . Symptomatic varicose veins, right 09/02/2015  . Eustachian tube dysfunction 12/04/2014  . Allergic rhinitis 09/15/2014  . Dyspnea 09/15/2014  . Liver hemangioma 08/24/2014  . Elevated triglycerides with high cholesterol 11/26/2013  . Insomnia 08/20/2013  . Hypertension 08/20/2013  . Ulcerative colitis, unspecified 04/20/2013  . Oral herpes 04/20/2013  . Hyperlipidemia 12/06/2006  . HEADACHE 12/06/2006  . CERVICAL CANCER, HX OF 12/06/2006  . Hypothyroidism 11/29/2006  . Asthma 11/29/2006  . IRRITABLE BOWEL SYNDROME 11/29/2006    Allyson Sabal [facility] 09/03/2017, 3:21 PM  [place], [place], [address] Phone: [phone]   Fax:  [phone]  Name: Erin Fields MRN: [id number] Date of Birth: [birth year]  Manus Gunning, PT 09/03/17 3:21 PM

## 2017-09-05 ENCOUNTER — Ambulatory Visit: Payer: BLUE CROSS/BLUE SHIELD | Admitting: Physical Therapy

## 2017-09-13 ENCOUNTER — Encounter: Payer: Self-pay | Admitting: Radiation Oncology

## 2017-09-13 ENCOUNTER — Other Ambulatory Visit: Payer: Self-pay

## 2017-09-13 ENCOUNTER — Ambulatory Visit
Admission: RE | Admit: 2017-09-13 | Discharge: 2017-09-13 | Disposition: A | Discharge status: Home / Self Care | Payer: BLUE CROSS/BLUE SHIELD | Source: Ambulatory Visit | Attending: Radiation Oncology | Admitting: Radiation Oncology

## 2017-09-13 VITALS — BP 124/78 | HR 92 | Temp 98.2°F | Resp 18 | Ht 63.5 in | Wt 177.4 lb

## 2017-09-13 DIAGNOSIS — Z79899 Other long term (current) drug therapy: Secondary | ICD-10-CM | POA: Insufficient documentation

## 2017-09-13 DIAGNOSIS — C50411 Malignant neoplasm of upper-outer quadrant of right female breast: Secondary | ICD-10-CM

## 2017-09-13 DIAGNOSIS — Z170 Estrogen receptor positive status [ER+]: Secondary | ICD-10-CM | POA: Insufficient documentation

## 2017-09-13 DIAGNOSIS — R232 Flushing: Secondary | ICD-10-CM | POA: Insufficient documentation

## 2017-09-13 DIAGNOSIS — M2550 Pain in unspecified joint: Secondary | ICD-10-CM | POA: Diagnosis not present

## 2017-09-13 DIAGNOSIS — Z923 Personal history of irradiation: Secondary | ICD-10-CM | POA: Insufficient documentation

## 2017-09-13 DIAGNOSIS — Z79811 Long term (current) use of aromatase inhibitors: Secondary | ICD-10-CM | POA: Diagnosis not present

## 2017-09-13 NOTE — Progress Notes (Signed)
Pt here today for a follow up for right breast cancer. Pt states that she has pain that is a 2 out of 10. Pt states that she has a dull achy pain. Pt states that she does stretches to help with the pain. Pt states that she has mild fatigue. Pt states that she has some peeling and itching at the nipple area. Pt states that she is using the radiaplex gel 2 times per day.  BP 124/78 (BP Location: Right Arm, Patient Position: Sitting, Cuff Size: Normal)   Pulse 92   Temp 98.2 F (36.8 C) (Oral)   Resp 18   Ht 5' 3.5" (1.613 m)   Wt 177 lb 6.4 oz (80.5 kg)   SpO2 96%   BMI 30.93 kg/m   Wt Readings from Last 3 Encounters:  09/13/17 177 lb 6.4 oz (80.5 kg)  08/06/17 181 lb 1.6 oz (82.1 kg)  06/29/17 180 lb 9.6 oz (81.9 kg)

## 2017-09-13 NOTE — Progress Notes (Signed)
Radiation Oncology         [phone] ________________________________  Name: Erin Fields MRN: [id number]  Date: 09/13/2017  DOB: 1965-07-22  Follow-Up Visit Note  CC: Copland, Gay Filler, MD  [name], MD    ICD-10-CM   1. Malignant neoplasm of upper-outer quadrant of right breast in female, estrogen receptor positive ([place]) C50.411    Z17.0     Diagnosis:   52 y.o. female with malignant neoplasm of upper-outer quadrant of right breast in female, estrogen receptor positive ([place]) Staging form: Breast, AJCC 8th Edition - Clinical stage from 03/16/2017: Stage IA (cT1b, cN0, cM0, G2, ER: Positive, PR: Positive, HER2: Negative) - Pathologic Stage pT2, pN0 (i+), pMX   Interval Since Last Radiation:  5 weeks   Radiation treatment dates:   06/26/2017 - 08/09/2017 Site/dose:   1. Right Breast / 50.4 Gy delivered in 28 fractions of 1.8 Gy 2. Right Breast Boost / 10 Gy delivered in 5 fractions of 2 Gy  Narrative:  The patient returns today for routine follow-up. She reports dull achy pain that is a 2 out of 10. She continues to attend physical therapy for massages and does daily stretching exercises that help with the pain. She reports mild fatigue. She reports some peeling and itching at the nipple area. She states that she is using the radiaplex gel 2 times per day. She started on letrozole 3 weeks ago that has caused her to have joint pain (everywhere), fatigue, hot flashes, and some hair loss.                      ALLERGIES:  is allergic to ace inhibitors; aspirin; cefaclor; lisinopril; losartan potassium; oxycodone; and sulfonamide derivatives.  Meds: Current Outpatient Medications  Medication Sig Dispense Refill  . acyclovir (ZOVIRAX) 400 MG tablet Take 1 tablet (400 mg total) by mouth 3 (three) times daily as needed. 30 tablet 5  . azelastine (ASTELIN) 0.1 % nasal spray 2 sprays 2 (two) times daily.  6  . budesonide-formoterol (SYMBICORT) 160-4.5 MCG/ACT inhaler Inhale 2 puffs  into the lungs 2 (two) times daily.    . chlorpheniramine-HYDROcodone (TUSSIONEX PENNKINETIC ER) 10-8 MG/5ML SUER Take 5 mLs by mouth every 12 (twelve) hours as needed for cough (cough). 140 mL 0  . diazepam (VALIUM) 2 MG tablet Take 1 tablet (2 mg total) by mouth every 6 (six) hours as needed for anxiety. Do not combine with ambien 30 tablet 1  . EPINEPHrine 0.3 mg/0.3 mL IJ SOAJ injection epinephrine 0.3 mg/0.3 mL injection, auto-injector  TK UTD PRN    . hyaluronate sodium (RADIAPLEXRX) GEL Apply 1 application topically 2 (two) times daily.    Marland Kitchen HYDROcodone-acetaminophen (NORCO/VICODIN) 5-325 MG tablet hydrocodone 5 mg-acetaminophen 325 mg tablet    . hyoscyamine (ANASPAZ) 0.125 MG TBDP disintergrating tablet hyoscyamine 0.125 mg disintegrating tablet    . lansoprazole (PREVACID) 30 MG capsule Take 30 mg by mouth daily at 12 noon.    Marland Kitchen letrozole (FEMARA) 2.5 MG tablet Take 1 tablet (2.5 mg total) by mouth daily. 30 tablet 3  . levalbuterol (XOPENEX HFA) 45 MCG/ACT inhaler Inhale 2 puffs into the lungs every 4 (four) hours as needed for wheezing (Q4-6 Hours PRN).    Marland Kitchen levocetirizine (XYZAL) 5 MG tablet levocetirizine 5 mg tablet  TK 1 T PO QD IN THE EVE    . levothyroxine (SYNTHROID, LEVOTHROID) 88 MCG tablet Take 1 tablet (88 mcg total) by mouth daily. 90 tablet 3  .  mesalamine (LIALDA) 1.2 G EC tablet Take 2.4 g by mouth daily.    . montelukast (SINGULAIR) 10 MG tablet Take 10 mg by mouth at bedtime.     . non-metallic deodorant Jethro Poling) MISC Apply 1 application topically daily as needed.    Marland Kitchen olmesartan (BENICAR) 40 MG tablet Take 1 tablet daily 90 tablet 3  . predniSONE (DELTASONE) 10 MG tablet 6 TAB PO DAY 1 5 TAB PO DAY 2 4 TAB PO DAY 3 3 TAB PO DAY 4 2 TAB PO DAY 5 1 TAB PO DAY 6 21 tablet 0  . promethazine (PHENERGAN) 25 MG tablet TAKE 1/2-1 TABLET DAILY AS NEEDED FOR NAUSEA 30 tablet 0  . rosuvastatin (CRESTOR) 10 MG tablet Take 1 tablet (10 mg total) by mouth daily. 30 tablet 11    . triamcinolone (NASACORT ALLERGY 24HR) 55 MCG/ACT AERO nasal inhaler Place 1 spray into the nose daily.    Marland Kitchen venlafaxine XR (EFFEXOR-XR) 150 MG 24 hr capsule TAKE 1 BY MOUTH EVERY DAY WITH BREAKFAST 90 capsule 3  . zolpidem (AMBIEN) 5 MG tablet TAKE 1 TABLET BY MOUTH EVERY DAY AT BEDTIME AS NEEDED. DO NOT COMBINE WITH ANY OTHER SEDATING MEDICATION 30 tablet 1   No current facility-administered medications for this encounter.     Physical Findings: The patient is in no acute distress. Patient is alert and oriented.  height is 5' 3.5" (1.613 m) and weight is 177 lb 6.4 oz (80.5 kg). Her oral temperature is 98.2 F (36.8 C). Her blood pressure is 124/78 and her pulse is 92. Her respiration is 18 and oxygen saturation is 96%.   Lungs are clear to auscultation bilaterally. Heart has regular rate and rhythm. No palpable cervical, supraclavicular, or axillary adenopathy. Abdomen soft, non-tender, normal bowel sounds.  Breast Exam Right Breast: Patient has hyperpigmentation changes throughout the breast. She continues to have some edema in the breast. Skin is well-healed at this time. Some mild crusting along the nipple areolar area. No dominant mass, nipple discharge or bleeding. Left Breast: No palpable mass or nipple discharge  Lab Findings: Lab Results  Component Value Date   WBC 9.6 03/28/2017   HGB 14.3 05/15/2017   HCT 41.9 03/28/2017   MCV 88.6 03/28/2017   PLT 341 03/28/2017    Radiographic Findings: No results found.  Impression:  The patient is recovering from the effects of radiation.  No evidence of recurrence on clinical exam. She continues with physical therapy concerning her "cording".  Plan:  Routine follow up in 3 months - August 29th at 11:15 AM.  ____________________________________  Blair Promise, PhD, MD  This document serves as a record of services personally performed by [name], MD. It was created on his behalf by Rae Lips, a trained medical  scribe. The creation of this record is based on the scribe's personal observations and the provider's statements to them. This document has been checked and approved by the attending provider.

## 2017-09-14 ENCOUNTER — Encounter: Payer: BLUE CROSS/BLUE SHIELD | Admitting: Physical Therapy

## 2017-09-16 ENCOUNTER — Encounter: Payer: Self-pay | Admitting: Hematology

## 2017-09-18 ENCOUNTER — Other Ambulatory Visit: Payer: Self-pay | Admitting: Family Medicine

## 2017-09-18 ENCOUNTER — Telehealth: Payer: Self-pay | Admitting: Hematology

## 2017-09-18 ENCOUNTER — Other Ambulatory Visit: Payer: Self-pay | Admitting: Hematology

## 2017-09-18 DIAGNOSIS — F5104 Psychophysiologic insomnia: Secondary | ICD-10-CM

## 2017-09-18 MED ORDER — EXEMESTANE 25 MG PO TABS: 25.0000 mg | ORAL_TABLET | Freq: Every day | ORAL | 2 refills | Status: DC

## 2017-09-18 NOTE — Telephone Encounter (Signed)
Seen here in February [facility] reviewed- ok to refill

## 2017-09-18 NOTE — Telephone Encounter (Signed)
I called patient back.  He started having significant bilateral hip and knee pain 3 days after she started letrozole, she has tried ibuprofen which does not help.  She tried meloxicam which helped some.  She reports her pain level is 5-6 out of 10, and sometimes it is difficult to move around.  This is likely related to letrozole, I recommend her to stop letrozole.  I discussed the other antiestrogen therapy options, including exemestane, anastrozole and tamoxifen.  I recommended her to try exemestane first, I called into her pharmacy today.  I suggest her to start in a few weeks when her joint pain resolves.  She voiced good understanding, and agrees to try.  [name]  09/18/2017

## 2017-09-18 NOTE — Telephone Encounter (Signed)
Requesting:Ambien Contract:none KIC:[id number] Last Visit:06/29/17 Next Visit:09/24/17 Last Refill:07/18/17 1-R  No discrepancies  Please Advise

## 2017-09-19 ENCOUNTER — Ambulatory Visit: Payer: BLUE CROSS/BLUE SHIELD | Attending: General Surgery | Admitting: Physical Therapy

## 2017-09-19 ENCOUNTER — Encounter: Payer: Self-pay | Admitting: Physical Therapy

## 2017-09-19 DIAGNOSIS — M25511 Pain in right shoulder: Secondary | ICD-10-CM

## 2017-09-19 DIAGNOSIS — M6281 Muscle weakness (generalized): Secondary | ICD-10-CM | POA: Diagnosis present

## 2017-09-19 DIAGNOSIS — R222 Localized swelling, mass and lump, trunk: Secondary | ICD-10-CM | POA: Diagnosis present

## 2017-09-19 DIAGNOSIS — M25611 Stiffness of right shoulder, not elsewhere classified: Secondary | ICD-10-CM | POA: Insufficient documentation

## 2017-09-19 DIAGNOSIS — Z483 Aftercare following surgery for neoplasm: Secondary | ICD-10-CM | POA: Diagnosis not present

## 2017-09-19 NOTE — Therapy (Signed)
[place], [place], [address] Phone: [phone]   Fax:  [phone]  Physical Therapy Treatment  Patient Details  Name: Erin Fields MRN: [id number] Date of Birth: 02/15/1966 Referring Provider: Dr. [name]    Encounter Date: 09/19/2017  PT End of Session - 09/19/17 1205    Visit Number  13    Number of Visits  18    Date for PT Re-Evaluation  09/28/17    PT Start Time  0932    PT Stop Time  1015    PT Time Calculation (min)  43 min    Activity Tolerance  Patient tolerated treatment well    Behavior During Therapy  [facility] for tasks assessed/performed       Past Medical History:  Diagnosis Date  . Allergy   . Anxiety   . Asthma   . Cancer [facility])    right breast cancer  . Chicken pox   . Colon polyps   . Depression   . Elevated blood pressure   . Environmental allergies   . Family history of cancer   . Frequent headaches   . GERD (gastroesophageal reflux disease)   . Hay fever   . Hyperlipidemia   . Hypertension   . Hypothyroidism   . Migraines   . Personal history of radiation therapy   . PONV (postoperative nausea and vomiting)   . Thyroid disease   . Ulcerative colitis ([place])   . Varicose veins     Past Surgical History:  Procedure Laterality Date  . ABLATION  2011  . BREAST BIOPSY Right 02/2017  . BREAST LUMPECTOMY Right 04/2017  . BREAST LUMPECTOMY WITH RADIOACTIVE SEED AND SENTINEL LYMPH NODE BIOPSY Right 04/24/2017   Procedure: RIGHT BREAST LUMPECTOMY WITH RADIOACTIVE SEED AND SENTINEL LYMPH NODE BIOPSY;  Surgeon: [name], MD;  Location: [place];  Service: General;  Laterality: Right;  . FOOT SURGERY Right 2012  . HAND SURGERY Right   . RE-EXCISION OF BREAST LUMPECTOMY Right 05/15/2017   Procedure: RE-EXCISION OF BREAST LUMPECTOMY;  Surgeon: [name], MD;  Location: [place];  Service: General;  Laterality: Right;  . Septoplasty with  turbinate reduction    . TONSILLECTOMY  1980  . VEIN SURGERY     Removal Right Leg  . WISDOM TOOTH EXTRACTION      There were no vitals filed for this visit.  Subjective Assessment - 09/19/17 0934    Subjective  My arm is pretty sore. I did quite a bit this past week. Then I had a stomach bug. I am just pretty tired.     Pertinent History  right sided breast cancer diagnosed around 02/15/2017.  Had lumpectomy with 3 nodes removed on1/11/2017 with re-excision on 05/15/2017 and developed swelling after both surgeries and has had 2 aspiration since  Will not have to have chemo but will have radiation.  medical oncologist is Dr. Burr Medico  Past history includes asthma and arthritis in her knee     Patient Stated Goals  help with R breast and trunk pain and edema    Currently in Pain?  Yes    Pain Score  3     Pain Location  Axilla    Pain Orientation  Right    Pain Descriptors / Indicators  Constant;Aching;Tightness                       [facility] Adult PT Treatment/Exercise -  09/19/17 0001      Shoulder Exercises: Pulleys   Flexion  2 minutes with cues for upright posture    ABduction  2 minutes with cues for upright posture      Shoulder Exercises: Therapy Ball   Flexion  Both;10 reps with stretch at top- pt returned therapist demo    ABduction  10 reps;Right      Manual Therapy   Soft tissue mobilization  to cording at medial right upper arm and antecubital fossa in supine    Passive ROM  in supine for right shoulder into er, abduction, and flexion                  PT Long Term Goals - 08/22/17 1353      PT LONG TERM GOAL #1   Title  Pt will be independent in lymphedema risk reduction practices    Status  Achieved      PT LONG TERM GOAL #2   Title  Pt will have > 125 degrees of painful shoulder abduction so that she can received radiation treatment     Status  Achieved      PT LONG TERM GOAL #3   Title  Pt will be independent in a home exercise program for  ROM and Strength of Upper body     Status  Partially Met      PT LONG TERM GOAL #4   Title  Pt will be indpendent in self manual lymph draiange and use of compression for right breast lymphedema     Status  On-going            Plan - 09/19/17 1205    Clinical Impression Statement  Continued working on PROM to pt's RUE today and pt demonstrate full PROM but still felt tightness at end range. Focused on cording that while not palpable pt can feel the cording and is able to feel a stretch during myofascial release to antecubital fossa and upper arm. Pt reports independence with supine scap series so did not do those today.     Rehab Potential  Good    Clinical Impairments Affecting Rehab Potential  seroma, 3 nodes removed    PT Frequency  2x / week    PT Duration  4 weeks    PT Treatment/Interventions  ADLs/Self Care Home Management;Taping;Scar mobilization;Passive range of motion;Compression bandaging;Manual lymph drainage;Manual techniques;Patient/family education;Therapeutic exercise;Therapeutic activities    PT Next Visit Plan   assess goals d/c?, continue AA/A/PROM to R shoulder    PT Home Exercise Plan  supine pectoralis horizontal abduction stretch, median nerve wall glide given 4/17, supine scap series    Consulted and Agree with Plan of Care  Patient       Patient will benefit from skilled therapeutic intervention in order to improve the following deficits and impairments:  Decreased knowledge of use of DME, Pain, Postural dysfunction, Decreased range of motion, Decreased strength, Impaired perceived functional ability, Obesity, Increased edema, Decreased knowledge of precautions  Visit Diagnosis: Aftercare following surgery for neoplasm  Acute pain of right shoulder  Stiffness of right shoulder, not elsewhere classified     Problem List Patient Active Problem List   Diagnosis Date Noted  . Genetic testing 07/16/2017  . Family history of cancer   . Controlled type 2  diabetes mellitus without complication, without long-term current use of insulin ([place]) 05/28/2017  . Malignant neoplasm of upper-outer quadrant of right breast in female, estrogen receptor positive ([place]) 03/20/2017  .  Crohn disease ([place]) 11/29/2016  . Borderline diabetes 12/17/2015  . Depression 12/17/2015  . Ex-smoker 11/18/2015  . Chronic venous insufficiency 09/02/2015  . Symptomatic varicose veins, right 09/02/2015  . Eustachian tube dysfunction 12/04/2014  . Allergic rhinitis 09/15/2014  . Dyspnea 09/15/2014  . Liver hemangioma 08/24/2014  . Elevated triglycerides with high cholesterol 11/26/2013  . Insomnia 08/20/2013  . Hypertension 08/20/2013  . Ulcerative colitis, unspecified 04/20/2013  . Oral herpes 04/20/2013  . Hyperlipidemia 12/06/2006  . HEADACHE 12/06/2006  . CERVICAL CANCER, HX OF 12/06/2006  . Hypothyroidism 11/29/2006  . Asthma 11/29/2006  . IRRITABLE BOWEL SYNDROME 11/29/2006    Ally[facility] 09/19/2017, 12:07 PM  [place], [place], [address] Phone: [phone]   Fax:  [phone]  Name: Erin Fields MRN: [id number] Date of Birth: 1965/09/06  Manus Gunning, PT 09/19/17 12:07 PM

## 2017-09-22 NOTE — Progress Notes (Addendum)
[place] at [facility] [address], [place], [place] [address] [phone]  Date:  09/24/2017   Name:  Erin Fields   DOB:  [birth year]   MRN:  [id number]  PCP:  Darreld Mclean, MD    Chief Complaint: A1c Recheck   History of Present Illness:  Erin Fields is a 52 y.o. very pleasant female patient who presents with the following:  Following up on her A1c today Lab Results  Component Value Date   HGBA1C 6.7 (H) 05/28/2017   From our last visit in 06/12/2022: Recently diagnosed with breast cancer-she had palpated a mass in October and presented to her GYN who did further work-up She had a lumpectomy on 1/29- her surgeon is Dr. [name] She is having genetic testing and will have radiation, ?femara or similar for several months She has not been to work in about 5 weeks now  Her mood is pretty ok- she is taking her valium daily She is able to sleep pretty well except when she is on hydrocodone for pain- this disturbs her sleep  She is on effexor xr 75- might like to increase to 150 for the time being She takes her effexor in the am, her valium and mid day and her ambien at night  Her husband died in an accident in June 12, 2016, and her daughter died in 13-Jun-2015 due to accidental OD Her son is at [employer]- he is doing ok but is afraid that she will die too having just lost his father   At our last visit we increased her effexor dose, and also did repeat her A1c which was over 6.5 for the 2nd time. Did not start any meds for DM however at that time.  She would like to check on this for her today  Her oncologist is Dr. Burr Medico She finished her radiation already- did 7 weeks of treatment. She has tried one aromatase inhibitor- letrozole- but did not tolerate, had to stop using it.  They plan to try her on exemestane, but she has not started this yet  She feels like the increased dose of effexor is helping with her mood. Her hot flashes have  calmed down some but she is   Her son is doing well at this time  She does see optho- Dr. Gwynn Burly, saw him in the spring .  He does monitor her for any changes of DM in her eyes  She notes that her hot flashes and joint pains are better right now, but she is not sure what will happen when she goes on her next med per Dr. Burr Medico  Pulse Readings from Last 3 Encounters:  09/24/17 06/12/1984  09/13/17 92  08/06/17 89    Lab Results  Component Value Date   HGBA1C 6.7 (H) 05/28/2017   Lab Results  Component Value Date   TSH 0.62 05/28/2017   Recent TSH was ok   Patient Active Problem List   Diagnosis Date Noted  . Genetic testing 07/16/2017  . Family history of cancer   . Controlled type 2 diabetes mellitus without complication, without long-term current use of insulin ([place]) 05/28/2017  . Malignant neoplasm of upper-outer quadrant of right breast in female, estrogen receptor positive ([place]) 03/20/2017  . Crohn disease ([place]) 11/29/2016  . Borderline diabetes 12/17/2015  . Depression 12/17/2015  . Ex-smoker 11/18/2015  . Chronic venous insufficiency 09/02/2015  . Symptomatic varicose veins, right 09/02/2015  .  Eustachian tube dysfunction 12/04/2014  . Allergic rhinitis 09/15/2014  . Dyspnea 09/15/2014  . Liver hemangioma 08/24/2014  . Elevated triglycerides with high cholesterol 11/26/2013  . Insomnia 08/20/2013  . Hypertension 08/20/2013  . Ulcerative colitis, unspecified 04/20/2013  . Oral herpes 04/20/2013  . Hyperlipidemia 12/06/2006  . HEADACHE 12/06/2006  . CERVICAL CANCER, HX OF 12/06/2006  . Hypothyroidism 11/29/2006  . Asthma 11/29/2006  . IRRITABLE BOWEL SYNDROME 11/29/2006    Past Medical History:  Diagnosis Date  . Allergy   . Anxiety   . Asthma   . Cancer [facility])    right breast cancer  . Chicken pox   . Colon polyps   . Depression   . Elevated blood pressure   . Environmental allergies   . Family history of cancer   . Frequent headaches   . GERD  (gastroesophageal reflux disease)   . Hay fever   . Hyperlipidemia   . Hypertension   . Hypothyroidism   . Migraines   . Personal history of radiation therapy   . PONV (postoperative nausea and vomiting)   . Thyroid disease   . Ulcerative colitis ([place])   . Varicose veins     Past Surgical History:  Procedure Laterality Date  . ABLATION  2011  . BREAST BIOPSY Right 02/2017  . BREAST LUMPECTOMY Right 04/2017  . BREAST LUMPECTOMY WITH RADIOACTIVE SEED AND SENTINEL LYMPH NODE BIOPSY Right 04/24/2017   Procedure: RIGHT BREAST LUMPECTOMY WITH RADIOACTIVE SEED AND SENTINEL LYMPH NODE BIOPSY;  Surgeon: [name], MD;  Location: [place];  Service: General;  Laterality: Right;  . FOOT SURGERY Right 2012  . HAND SURGERY Right   . RE-EXCISION OF BREAST LUMPECTOMY Right 05/15/2017   Procedure: RE-EXCISION OF BREAST LUMPECTOMY;  Surgeon: [name], MD;  Location: [place];  Service: General;  Laterality: Right;  . Septoplasty with turbinate reduction    . TONSILLECTOMY  1980  . VEIN SURGERY     Removal Right Leg  . WISDOM TOOTH EXTRACTION      Social History   Tobacco Use  . Smoking status: Former Smoker    Packs/day: 4.00    Years: 15.00    Pack years: 60.00    Types: Cigarettes    Last attempt to quit: 09/14/1993    Years since quitting: 24.0  . Smokeless tobacco: Never Used  . Tobacco comment: Quit >20 yrs ago  Substance Use Topics  . Alcohol use: Yes    Comment: Drinks a couple every 2-3 months  . Drug use: No    Family History  Problem Relation Age of Onset  . Arthritis Mother 4       Deceased  . Breast cancer Mother   . Lung cancer Mother   . Hyperlipidemia Mother   . Heart disease Mother   . Hypertension Mother   . Diabetes Mother   . Crohn's disease Mother   . Diabetes Maternal Grandfather   . Heart disease Maternal Grandfather   . Heart attack Maternal Grandfather   . Leukemia Maternal Grandfather   . Colitis Sister    . Leukemia Cousin 3       mat cousin    Allergies  Allergen Reactions  . Ace Inhibitors Other (See Comments)    Angina  . Aspirin   . Cefaclor   . Lisinopril Cough  . Losartan Potassium   . Oxycodone Hives  . Sulfonamide Derivatives     Medication list has been reviewed and updated.  Current Outpatient Medications on File Prior to Visit  Medication Sig Dispense Refill  . acyclovir (ZOVIRAX) 400 MG tablet Take 1 tablet (400 mg total) by mouth 3 (three) times daily as needed. 30 tablet 5  . azelastine (ASTELIN) 0.1 % nasal spray 2 sprays 2 (two) times daily.  6  . budesonide-formoterol (SYMBICORT) 160-4.5 MCG/ACT inhaler Inhale 2 puffs into the lungs 2 (two) times daily.    . chlorpheniramine-HYDROcodone (TUSSIONEX PENNKINETIC ER) 10-8 MG/5ML SUER Take 5 mLs by mouth every 12 (twelve) hours as needed for cough (cough). 140 mL 0  . diazepam (VALIUM) 2 MG tablet Take 1 tablet (2 mg total) by mouth every 6 (six) hours as needed for anxiety. Do not combine with ambien 30 tablet 1  . EPINEPHrine 0.3 mg/0.3 mL IJ SOAJ injection epinephrine 0.3 mg/0.3 mL injection, auto-injector  TK UTD PRN    . exemestane (AROMASIN) 25 MG tablet Take 1 tablet (25 mg total) by mouth daily after breakfast. 30 tablet 2  . hyaluronate sodium (RADIAPLEXRX) GEL Apply 1 application topically 2 (two) times daily.    Marland Kitchen HYDROcodone-acetaminophen (NORCO/VICODIN) 5-325 MG tablet hydrocodone 5 mg-acetaminophen 325 mg tablet    . hyoscyamine (ANASPAZ) 0.125 MG TBDP disintergrating tablet hyoscyamine 0.125 mg disintegrating tablet    . lansoprazole (PREVACID) 30 MG capsule Take 30 mg by mouth daily at 12 noon.    Marland Kitchen letrozole (FEMARA) 2.5 MG tablet Take 1 tablet (2.5 mg total) by mouth daily. 30 tablet 3  . levalbuterol (XOPENEX HFA) 45 MCG/ACT inhaler Inhale 2 puffs into the lungs every 4 (four) hours as needed for wheezing (Q4-6 Hours PRN).    Marland Kitchen levocetirizine (XYZAL) 5 MG tablet levocetirizine 5 mg tablet  TK 1 T PO  QD IN THE EVE    . levothyroxine (SYNTHROID, LEVOTHROID) 88 MCG tablet Take 1 tablet (88 mcg total) by mouth daily. 90 tablet 3  . mesalamine (LIALDA) 1.2 G EC tablet Take 2.4 g by mouth daily.    . montelukast (SINGULAIR) 10 MG tablet Take 10 mg by mouth at bedtime.     . non-metallic deodorant Jethro Poling) MISC Apply 1 application topically daily as needed.    Marland Kitchen olmesartan (BENICAR) 40 MG tablet Take 1 tablet daily 90 tablet 3  . promethazine (PHENERGAN) 25 MG tablet TAKE 1/2-1 TABLET DAILY AS NEEDED FOR NAUSEA 30 tablet 0  . rosuvastatin (CRESTOR) 10 MG tablet Take 1 tablet (10 mg total) by mouth daily. 30 tablet 11  . triamcinolone (NASACORT ALLERGY 24HR) 55 MCG/ACT AERO nasal inhaler Place 1 spray into the nose daily.    Marland Kitchen venlafaxine XR (EFFEXOR-XR) 150 MG 24 hr capsule TAKE 1 BY MOUTH EVERY DAY WITH BREAKFAST 90 capsule 3  . zolpidem (AMBIEN) 5 MG tablet TAKE 1 TABLET BY MOUTH EVERY DAY AT BEDTIME AS NEEDED. DO NOT COMBINE WITH ANY OTHER SEDATING MEDICATION 30 tablet 1   No current facility-administered medications on file prior to visit.     Review of Systems:  As per HPI- otherwise negative.   Physical Examination: Vitals:   09/24/17 0938  BP: 116/82  Pulse: 86  Resp: 16  SpO2: 95%   Vitals:   09/24/17 0938  Weight: 178 lb (80.7 kg)  Height: 5' 3.5" (1.613 m)   Body mass index is 31.04 kg/m. Ideal Body Weight: Weight in (lb) to have BMI = 25: 143.1  GEN: WDWN, NAD, Non-toxic, A & O x 3, overweight, looks well  HEENT: Atraumatic, Normocephalic. Neck supple. No masses, No  LAD. Ears and Nose: No external deformity. CV: RRR, No M/G/R. No JVD. No thrill. No extra heart sounds. PULM: CTA B, no wheezes, crackles, rhonchi. No retractions. No resp. distress. No accessory muscle use. EXTR: No c/c/e NEURO Normal gait.  PSYCH: Normally interactive. Conversant. Not depressed or anxious appearing.  Calm demeanor.  Foot exam normal today    Assessment and Plan: Controlled type 2  diabetes mellitus without complication, without long-term current use of insulin (HCC) - Plan: Hemoglobin A1c  Essential hypertension  Malignant neoplasm of upper-outer quadrant of right breast in female, estrogen receptor positive ([place])  Following up on her A1c today.  Discussed possible medication treatment for her esp if A1c is up. Would ideally like to put her on metformin but she has a lot going on right now with her cancer treatment.  She may consider this med either now or later  BP looks good today, continue benicar Will plan further follow- up pending labs.    Signed Lamar Blinks, MD  Received her A1c Results for orders placed or performed in visit on 09/24/17  Hemoglobin A1c  Result Value Ref Range   Hgb A1c MFr Bld 6.3 4.6 - 6.5 %   Message to pt- looks good

## 2017-09-24 ENCOUNTER — Ambulatory Visit: Payer: BLUE CROSS/BLUE SHIELD | Admitting: Family Medicine

## 2017-09-24 ENCOUNTER — Encounter: Payer: Self-pay | Admitting: Family Medicine

## 2017-09-24 VITALS — BP 116/82 | HR 86 | Resp 16 | Ht 63.5 in | Wt 178.0 lb

## 2017-09-24 DIAGNOSIS — E119 Type 2 diabetes mellitus without complications: Secondary | ICD-10-CM | POA: Diagnosis not present

## 2017-09-24 DIAGNOSIS — C50411 Malignant neoplasm of upper-outer quadrant of right female breast: Secondary | ICD-10-CM | POA: Diagnosis not present

## 2017-09-24 DIAGNOSIS — Z170 Estrogen receptor positive status [ER+]: Secondary | ICD-10-CM | POA: Diagnosis not present

## 2017-09-24 DIAGNOSIS — I10 Essential (primary) hypertension: Secondary | ICD-10-CM

## 2017-09-24 LAB — HEMOGLOBIN A1C: Hgb A1c MFr Bld: 6.3 % (ref 4.6–6.5)

## 2017-09-25 ENCOUNTER — Ambulatory Visit: Payer: BLUE CROSS/BLUE SHIELD | Admitting: Physical Therapy

## 2017-09-27 ENCOUNTER — Telehealth: Payer: Self-pay | Admitting: Physical Therapy

## 2017-09-27 ENCOUNTER — Ambulatory Visit: Payer: BLUE CROSS/BLUE SHIELD | Admitting: Physical Therapy

## 2017-09-27 NOTE — Telephone Encounter (Signed)
Pt did not come to her appointment today so called to check on her.  Requested a call back to discuss if she needs futher treatment as she is getting close to meeting her PT goals.   Donato Heinz. Owens Shark, PT

## 2017-09-28 ENCOUNTER — Telehealth: Payer: Self-pay | Admitting: Physical Therapy

## 2017-10-01 ENCOUNTER — Ambulatory Visit: Payer: BLUE CROSS/BLUE SHIELD | Admitting: Physical Therapy

## 2017-10-01 DIAGNOSIS — M25611 Stiffness of right shoulder, not elsewhere classified: Secondary | ICD-10-CM

## 2017-10-01 DIAGNOSIS — M25511 Pain in right shoulder: Secondary | ICD-10-CM

## 2017-10-01 DIAGNOSIS — R222 Localized swelling, mass and lump, trunk: Secondary | ICD-10-CM

## 2017-10-01 DIAGNOSIS — M6281 Muscle weakness (generalized): Secondary | ICD-10-CM

## 2017-10-01 DIAGNOSIS — Z483 Aftercare following surgery for neoplasm: Secondary | ICD-10-CM | POA: Diagnosis not present

## 2017-10-01 NOTE — Patient Instructions (Addendum)
Self manual lymph drainage: Perform this sequence once a day.  Only give enough pressure no your skin to make the skin move.  Diaphragmatic - Supine   Inhale through nose making navel move out toward hands. Exhale through puckered lips, hands follow navel in. Repeat _5__ times. Rest _10__ seconds between repeats.   Copyright  VHI. All rights reserved.  Hug yourself.  Do circles at your neck just above your collarbones.  Repeat this 10 times.  Axilla - One at a Time   Using full weight of flat hand and fingers at center of uninvolved armpit, make _10__ in-place circles.   Copyright  VHI. All rights reserved.  LEG: Inguinal Nodes Stimulation   With small finger side of hand against hip crease on involved side, gently perform circles at the crease. Repeat __10_ times.   Copyright  VHI. All rights reserved.  1) Axilla to Inguinal Nodes - Pump   On involved side, pump _4__ times from armpit along side of trunk to hip crease.  Now gently stretch skin from the involved side to the uninvolved side across the chest at the shoulder line.  Repeat that 4 times.  Draw an imaginary diagonal line from upper outer breast through the nipple area toward lower inner breast.  Direct fluid upward and inward from this line toward the pathway across your upper chest .  Do this in three rows to treat all of the upper inner breast tissue, and do each row 3-4x.      Direct fluid to treat all of lower outer breast tissue downward and outward toward pathway that is aimed at the right groin.  Finish by doing the pathways as described above going from your involved armpit to the same side groin and going across your upper chest from the involved shoulder to the uninvolved shoulder.  Repeat the steps above where you do circles in your right groin and left armpit. Copyright  VHI. All rights reserved.    Abduction Lift With Wand     Stand up for this exercise: Hold wand palm up on right and palm  down on left. Reach right hand to side and toward ceiling for __5-10__ repetitions.  Hold _10___ seconds each upward position. Do __2__ sessions per day.  Copyright  VHI. All rights reserved.

## 2017-10-01 NOTE — Therapy (Signed)
[place], [place], [address] Phone: [phone]   Fax:  [phone]  Physical Therapy Treatment  Patient Details  Name: Erin Fields MRN: [id number] Date of Birth: January 19, 1966 Referring Provider: Dr. [name]    Encounter Date: 10/01/2017  PT End of Session - 10/01/17 1702    Visit Number  14    Number of Visits  18    PT Start Time  5885    PT Stop Time  1430    PT Time Calculation (min)  55 min    Activity Tolerance  Patient tolerated treatment well    Behavior During Therapy  [facility] for tasks assessed/performed       Past Medical History:  Diagnosis Date  . Allergy   . Anxiety   . Asthma   . Cancer [facility])    right breast cancer  . Chicken pox   . Colon polyps   . Depression   . Elevated blood pressure   . Environmental allergies   . Family history of cancer   . Frequent headaches   . GERD (gastroesophageal reflux disease)   . Hay fever   . Hyperlipidemia   . Hypertension   . Hypothyroidism   . Migraines   . Personal history of radiation therapy   . PONV (postoperative nausea and vomiting)   . Thyroid disease   . Ulcerative colitis ([place])   . Varicose veins     Past Surgical History:  Procedure Laterality Date  . ABLATION  2011  . BREAST BIOPSY Right 02/2017  . BREAST LUMPECTOMY Right 04/2017  . BREAST LUMPECTOMY WITH RADIOACTIVE SEED AND SENTINEL LYMPH NODE BIOPSY Right 04/24/2017   Procedure: RIGHT BREAST LUMPECTOMY WITH RADIOACTIVE SEED AND SENTINEL LYMPH NODE BIOPSY;  Surgeon: [name], MD;  Location: [facility];  Service: General;  Laterality: Right;  . FOOT SURGERY Right 2012  . HAND SURGERY Right   . RE-EXCISION OF BREAST LUMPECTOMY Right 05/15/2017   Procedure: RE-EXCISION OF BREAST LUMPECTOMY;  Surgeon: [name], MD;  Location: [facility];  Service: General;  Laterality: Right;  . Septoplasty with turbinate reduction    . TONSILLECTOMY   1980  . VEIN SURGERY     Removal Right Leg  . WISDOM TOOTH EXTRACTION      There were no vitals filed for this visit.  Subjective Assessment - 10/01/17 1337    Subjective  Can you just check and see if my breast swelling is down?  I don't think I'm doing the bottom part of the massage right.    Pertinent History  right sided breast cancer diagnosed around 02/15/2017.  Had lumpectomy with 3 nodes removed on1/11/2017 with re-excision on 05/15/2017 and developed swelling after both surgeries and has had 2 aspiration since  Will not have to have chemo but will have radiation.  medical oncologist is Dr. Burr Medico  Past history includes asthma and arthritis in her knee     Patient Stated Goals  help with R breast and trunk pain and edema    Currently in Pain?  Yes    Pain Score  3     Pain Location  Axilla    Pain Orientation  Right    Pain Descriptors / Indicators  Other (Comment) not that bad, nothing I can't handle    Aggravating Factors   moving the arm, sleeping on the right side (though she tries not to)    Pain Relieving Factors  stretching,  Aleve         [facility] PT Assessment - 10/01/17 0001      Observation/Other Assessments   Observations  Pt. asked therapist to look at lower right breast to see if fluid was removed from there. This therapist felt the tissue both looked and felt good: not indurated, but soft.      AROM   Right Shoulder Flexion  150 Degrees    Right Shoulder ABduction  142 Degrees prior to stretching                   [facility] Adult PT Treatment/Exercise - 10/01/17 0001      Self-Care   Other Self-Care Comments   reviewed self-manual lymph drainage of right breast      Shoulder Exercises: Stretch   Other Shoulder Stretches  standing stretch into abduction with dowel instructed      Manual Therapy   Soft tissue mobilization  to cording at right axilla    Myofascial Release  crosshands across right axilla, crosshands    Passive ROM  in supine for right  shoulder abduction      Prosthetics   Current prosthetic wear tolerance (days/week)                 PT Education - 10/01/17 1430    Education provided  Yes    Education Details  reviewed self-manual lymph drainage; instructed in dowel stretch for right shoulder abduction    Person(s) Educated  Patient    Methods  Explanation;Demonstration;Verbal cues;Handout;Tactile cues    Comprehension  Verbalized understanding;Returned demonstration          PT Long Term Goals - 10/01/17 1339      PT LONG TERM GOAL #1   Title  Pt will be independent in lymphedema risk reduction practices    Status  Achieved      PT LONG TERM GOAL #2   Title  Pt will have > 125 degrees of painful shoulder abduction so that she can received radiation treatment     Status  Achieved      PT LONG TERM GOAL #3   Title  Pt will be independent in a home exercise program for ROM and Strength of Upper body     Status  Achieved      PT LONG TERM GOAL #4   Title  Pt will be indpendent in self manual lymph draiange and use of compression for right breast lymphedema     Status  Achieved            Plan - 10/01/17 1703    Clinical Impression Statement  Pt. has met her goals and feels ready for discharge. She does still have some tightness in right shoulder. Her breast tissue feels soft, not indurated, though she feels she still has fluid in a couple of seromas. We reviewed today how to do self manual lymph drainage to be sure she is comfortable with that. Pt. knows we remain available to her with a new referral, if she feels she needs further therapy in the future.    Rehab Potential  Good    Clinical Impairments Affecting Rehab Potential  seroma, 3 nodes removed    PT Frequency  2x / week    PT Duration  4 weeks    PT Treatment/Interventions  ADLs/Self Care Home Management;Taping;Scar mobilization;Passive range of motion;Compression bandaging;Manual lymph drainage;Manual techniques;Patient/family  education;Therapeutic exercise;Therapeutic activities    PT Next Visit Plan  Discharge today.  PT Home Exercise Plan  supine pectoralis horizontal abduction stretch, median nerve wall glide given 4/17, supine scap series; dowel stretch for right shoulder abduction    Consulted and Agree with Plan of Care  Patient       Patient will benefit from skilled therapeutic intervention in order to improve the following deficits and impairments:  Decreased knowledge of use of DME, Pain, Postural dysfunction, Decreased range of motion, Decreased strength, Impaired perceived functional ability, Obesity, Increased edema, Decreased knowledge of precautions  Visit Diagnosis: Aftercare following surgery for neoplasm  Acute pain of right shoulder  Stiffness of right shoulder, not elsewhere classified  Localized swelling, mass and lump, trunk  Muscle weakness (generalized)     Problem List Patient Active Problem List   Diagnosis Date Noted  . Genetic testing 07/16/2017  . Family history of cancer   . Controlled type 2 diabetes mellitus without complication, without long-term current use of insulin ([place]) 05/28/2017  . Malignant neoplasm of upper-outer quadrant of right breast in female, estrogen receptor positive ([place]) 03/20/2017  . Crohn disease ([place]) 11/29/2016  . Borderline diabetes 12/17/2015  . Depression 12/17/2015  . Ex-smoker 11/18/2015  . Chronic venous insufficiency 09/02/2015  . Symptomatic varicose veins, right 09/02/2015  . Eustachian tube dysfunction 12/04/2014  . Allergic rhinitis 09/15/2014  . Dyspnea 09/15/2014  . Liver hemangioma 08/24/2014  . Elevated triglycerides with high cholesterol 11/26/2013  . Insomnia 08/20/2013  . Hypertension 08/20/2013  . Ulcerative colitis, unspecified 04/20/2013  . Oral herpes 04/20/2013  . Hyperlipidemia 12/06/2006  . HEADACHE 12/06/2006  . CERVICAL CANCER, HX OF 12/06/2006  . Hypothyroidism 11/29/2006  . Asthma 11/29/2006  .  IRRITABLE BOWEL SYNDROME 11/29/2006    SALISBURY,DONNA 10/01/2017, 5:08 PM  [place], [place], [address] Phone: [phone]   Fax:  [phone]  Name: Erin Fields MRN: [id number] Date of Birth: 03/04/1966  PHYSICAL THERAPY DISCHARGE SUMMARY  Visits from Start of Care: 14  Current functional level related to goals / functional outcomes: Goals met as noted above.   Remaining deficits: Still with some tightness in right shoulder. Pt. Also still feels cording at right upper arm, though it is not visible and only palpable with effort at end range of abduction.   Education / Equipment: HEP for stretching and strengthening, self-manual lymph drainage and use of compression bra. Plan: Patient agrees to discharge.  Patient goals were partially met. Patient is being discharged due to meeting the stated rehab goals.  ?????    Serafina Royals, PT 10/01/17 5:10 PM

## 2017-10-27 ENCOUNTER — Other Ambulatory Visit: Payer: Self-pay | Admitting: Family Medicine

## 2017-10-31 ENCOUNTER — Telehealth: Payer: Self-pay

## 2017-10-31 NOTE — Telephone Encounter (Signed)
Spoke with pt to remind of [facility] visit with NP on 11/05/17 at 2 pm.  Pt said she will come to appt.

## 2017-11-05 ENCOUNTER — Telehealth: Payer: Self-pay

## 2017-11-05 ENCOUNTER — Telehealth: Payer: Self-pay | Admitting: Adult Health

## 2017-11-05 ENCOUNTER — Encounter: Payer: Self-pay | Admitting: Adult Health

## 2017-11-05 ENCOUNTER — Inpatient Hospital Stay: Payer: BLUE CROSS/BLUE SHIELD

## 2017-11-05 ENCOUNTER — Inpatient Hospital Stay: Payer: BLUE CROSS/BLUE SHIELD | Attending: Adult Health | Admitting: Adult Health

## 2017-11-05 VITALS — BP 123/54 | HR 95 | Temp 98.6°F | Resp 18 | Ht 63.5 in | Wt 180.0 lb

## 2017-11-05 DIAGNOSIS — Z87891 Personal history of nicotine dependence: Secondary | ICD-10-CM | POA: Diagnosis not present

## 2017-11-05 DIAGNOSIS — I10 Essential (primary) hypertension: Secondary | ICD-10-CM | POA: Diagnosis not present

## 2017-11-05 DIAGNOSIS — C50411 Malignant neoplasm of upper-outer quadrant of right female breast: Secondary | ICD-10-CM

## 2017-11-05 DIAGNOSIS — Z806 Family history of leukemia: Secondary | ICD-10-CM

## 2017-11-05 DIAGNOSIS — Z79811 Long term (current) use of aromatase inhibitors: Secondary | ICD-10-CM | POA: Diagnosis not present

## 2017-11-05 DIAGNOSIS — Z170 Estrogen receptor positive status [ER+]: Secondary | ICD-10-CM | POA: Insufficient documentation

## 2017-11-05 DIAGNOSIS — N951 Menopausal and female climacteric states: Secondary | ICD-10-CM | POA: Insufficient documentation

## 2017-11-05 DIAGNOSIS — E119 Type 2 diabetes mellitus without complications: Secondary | ICD-10-CM | POA: Diagnosis not present

## 2017-11-05 LAB — CBC WITH DIFFERENTIAL (CANCER CENTER ONLY)
BASOS ABS: 0.1 10*3/uL (ref 0.0–0.1)
BASOS PCT: 1 %
Eosinophils Absolute: 0.2 10*3/uL (ref 0.0–0.5)
Eosinophils Relative: 3 %
HEMATOCRIT: 40.5 % (ref 34.8–46.6)
Hemoglobin: 13.3 g/dL (ref 11.6–15.9)
Lymphocytes Relative: 27 %
Lymphs Abs: 2.2 10*3/uL (ref 0.9–3.3)
MCH: 29.2 pg (ref 25.1–34.0)
MCHC: 32.8 g/dL (ref 31.5–36.0)
MCV: 88.8 fL (ref 79.5–101.0)
Monocytes Absolute: 0.7 10*3/uL (ref 0.1–0.9)
Monocytes Relative: 8 %
NEUTROS ABS: 5 10*3/uL (ref 1.5–6.5)
Neutrophils Relative %: 61 %
Platelet Count: 333 10*3/uL (ref 145–400)
RBC: 4.56 MIL/uL (ref 3.70–5.45)
RDW: 12.8 % (ref 11.2–14.5)
WBC: 8.1 10*3/uL (ref 3.9–10.3)

## 2017-11-05 LAB — PROTIME-INR
INR: 1.09
PROTHROMBIN TIME: 14 s (ref 11.4–15.2)

## 2017-11-05 LAB — APTT: aPTT: 32 seconds (ref 24–36)

## 2017-11-05 NOTE — Telephone Encounter (Signed)
Spoke with pt regarding normal labs.  Pt voiced understanding.  No questions at this time.  Knows to call center with any issues.

## 2017-11-05 NOTE — Progress Notes (Signed)
CLINIC:  Survivorship   REASON FOR VISIT:  Routine follow-up post-treatment for a recent history of breast cancer.  BRIEF ONCOLOGIC HISTORY:  Oncology History   Cancer Staging Malignant neoplasm of upper-outer quadrant of right breast in female, estrogen receptor positive ([place]) Staging form: Breast, AJCC 8th Edition - Clinical stage from 03/16/2017: Stage IA (cT1b, cN0, cM0, G2, ER: Positive, PR: Positive, HER2: Negative) - Signed by [name], MD on 03/28/2017 - Pathologic stage from 05/15/2017: Stage IA (pT2, pN0, cM0, G2, ER+, PR+, HER2-, Oncotype DX score: 3) - Signed by [name], MD on 08/06/2017       Malignant neoplasm of upper-outer quadrant of right breast in female, estrogen receptor positive ([place])   03/15/2017 Mammogram    Diagnostic Mammogram Right Breast  IMPRESSION: Palpable abnormality corresponds to distortion and focal mass associated acoustic shadowing measuring 0.8x0.9x0.9cm in the 930 o'clock location of the right breast 7 cm from the nipple.   RECOMMENDATION: Ultrasound-guided core biopsy is recommended and scheduled for the patient.      03/16/2017 Initial Biopsy    Biopsy Results  Diagnosis 03/16/17 Breast, right, needle core biopsy, 9:30 o'clock, ribbon clip placed - INVASIVE MAMMARY CARCINOMA. - SEE COMMENT.       03/16/2017 Receptors her2    Estrogen Receptor: 95%, POSITIVE, STRONG STAINING INTENSITY Progesterone Receptor: 95%, POSITIVE, STRONG STAINING INTENSITY Proliferation Marker Ki67: 3% HER2: NEGATIVE      03/20/2017 Initial Diagnosis    Malignant neoplasm of upper-outer quadrant of right breast in female, estrogen receptor positive ([place])      04/24/2017 Surgery    Right Breast Lumpectomy with Sentinel Node Biopsy with Dr. [name]       04/24/2017 Pathology Results    Diagnosis 04/24/17 1. Breast, lumpectomy, Right w/seed - INVASIVE LOBULAR CARCINOMA, GRADE 2, SPANNING 2.2 CM. - LOBULAR NEOPLASIA (ATYPICAL LOBULAR HYPERPLASIA). -  ANTERIOR SUPERIOR MARGIN IS BROADLY POSITIVE. - BIOPSY SITE. - SEE ONCOLOGY TABLE. 2. Breast, excision, Right additional Medial Margin - INVASIVE LOBULAR CARCINOMA. - LOBULAR CARCINOMA IN SITU. - INVASIVE CARCINOMA IS 0.3 CM FROM NEW MEDIAL MARGIN. 3. Lymph node, sentinel, biopsy, Right Axillary #1 - ISOLATED TUMOR CELLS IN ONE OF ONE LYMPH NODES (0/1). 4. Lymph node, sentinel, biopsy, Right Axillary #2 - ONE OF ONE LYMPH NODES NEGATIVE FOR CARCINOMA (0/1). 5. Lymph node, sentinel, biopsy, Right Axillary #3 - ONE OF ONE LYMPH NODES NEGATIVE FOR CARCINOMA (0/1).      04/24/2017 Oncotype testing    Her Oncotyple recurrence score was 3, her distant recurrence score with Tamoxifen was 3% and her adjuvant chemotherapy benefit was <1%.       05/15/2017 Surgery    Re-excision of Right Breast Lumpectomy with Dr. [name]       05/15/2017 Pathology Results    Diagnosis 05/15/17 Breast, excision, Right additional anterior margin - INVASIVE LOBULAR CARCINOMA. - ATYPICAL LOBULAR HYPERPLASIA. - THE NEW ANTERIOR SURGICAL RESECTION MARGIN IS NEGATIVE FOR CARCINOMA.      05/15/2017 Cancer Staging    Staging form: Breast, AJCC 8th Edition - Pathologic stage from 05/15/2017: Stage IA (pT2, pN0, cM0, G2, ER+, PR+, HER2-, Oncotype DX score: 3) - Signed by [name], MD on 08/06/2017      06/26/2017 -  Radiation Therapy    Adjuvant RT with Dr. [name]      07/12/2017 Genetic Testing    Negative genetic testing on the multi-cancer panel.  The Multi-Gene Panel offered by [name] includes sequencing and/or deletion duplication testing of the following  83 genes: ALK, APC, ATM, AXIN2,BAP1,  BARD1, BLM, BMPR1A, BRCA1, BRCA2, BRIP1, CASR, CDC73, CDH1, CDK4, CDKN1B, CDKN1C, CDKN2A (p14ARF), CDKN2A (p16INK4a), CEBPA, CHEK2, CTNNA1, DICER1, DIS3L2, EGFR (c.2369C>T, p.Thr790Met variant only), EPCAM (Deletion/duplication testing only), FH, FLCN, GATA2, GPC3, GREM1 (Promoter region deletion/duplication testing only),  HOXB13 (c.251G>A, p.Gly84Glu), HRAS, KIT, MAX, MEN1, MET, MITF (c.952G>A, p.Glu318Lys variant only), MLH1, MSH2, MSH3, MSH6, MUTYH, NBN, NF1, NF2, NTHL1, PALB2, PDGFRA, PHOX2B, PMS2, POLD1, POLE, POT1, PRKAR1A, PTCH1, PTEN, RAD50, RAD51C, RAD51D, RB1, RECQL4, RET, RUNX1, SDHAF2, SDHA (sequence changes only), SDHB, SDHC, SDHD, SMAD4, SMARCA4, SMARCB1, SMARCE1, STK11, SUFU, TERT, TERT, TMEM127, TP53, TSC1, TSC2, VHL, WRN and WT1.  The report date is July 12, 2017.       08/2017 -  Anti-estrogen oral therapy    Letrozole daily       INTERVAL HISTORY:  Erin Fields presents to the [facility] today for our initial meeting to review her survivorship care plan detailing her treatment course for breast cancer, as well as monitoring long-term side effects of that treatment, education regarding health maintenance, screening, and overall wellness and health promotion.     Overall, Erin Fields reports feeling quite well.  She is taking Exemestane daily and tolerating it much better than Letrozole.  She has noted some more bruising.  She noted a bruise from when her cat ran into her.  She also has one on her arm that a box hit.  She denies any blood in her stool, melena, hematuria, or any other bleeding.  She does note hair thinning.  She is experiencing hot flashes. She notes they are extreme and that she drips sweat all day long.  She notes occasional breast pain and right arm pain since surgery.    Otherwise she is well and without any other concerns today.     REVIEW OF SYSTEMS:  Review of Systems  Constitutional: Negative for appetite change, chills, fatigue, fever and unexpected weight change.  HENT:   Negative for hearing loss, lump/mass and trouble swallowing.   Eyes: Negative for eye problems and icterus.  Respiratory: Negative for chest tightness, cough and shortness of breath.   Cardiovascular: Negative for chest pain, leg swelling and palpitations.  Gastrointestinal: Positive for  constipation. Negative for abdominal distention, abdominal pain, blood in stool, diarrhea, nausea and vomiting.  Endocrine: Positive for hot flashes.  Musculoskeletal: Positive for arthralgias.  Skin: Negative for itching and rash.  Neurological: Negative for dizziness, extremity weakness, headaches and numbness.  Hematological: Negative for adenopathy. Does not bruise/bleed easily.  Psychiatric/Behavioral: Negative for depression. The patient is not nervous/anxious.    Breast: Denies any new nodularity, masses, tenderness, nipple changes, or nipple discharge.      ONCOLOGY TREATMENT TEAM:  1. Surgeon:  Dr. [name] at [facility] 2. Medical Oncologist: Dr. Burr Medico  3. Radiation Oncologist: Dr. [name]    PAST MEDICAL/SURGICAL HISTORY:  Past Medical History:  Diagnosis Date  . Allergy   . Anxiety   . Asthma   . Cancer [facility])    right breast cancer  . Chicken pox   . Colon polyps   . Depression   . Elevated blood pressure   . Environmental allergies   . Family history of cancer   . Frequent headaches   . GERD (gastroesophageal reflux disease)   . Hay fever   . Hyperlipidemia   . Hypertension   . Hypothyroidism   . Migraines   . Personal history of radiation therapy   . PONV (postoperative nausea  and vomiting)   . Thyroid disease   . Ulcerative colitis ([place])   . Varicose veins    Past Surgical History:  Procedure Laterality Date  . ABLATION  2011  . BREAST BIOPSY Right 02/2017  . BREAST LUMPECTOMY Right 04/2017  . BREAST LUMPECTOMY WITH RADIOACTIVE SEED AND SENTINEL LYMPH NODE BIOPSY Right 04/24/2017   Procedure: RIGHT BREAST LUMPECTOMY WITH RADIOACTIVE SEED AND SENTINEL LYMPH NODE BIOPSY;  Surgeon: [name], MD;  Location: [place];  Service: General;  Laterality: Right;  . FOOT SURGERY Right 2012  . HAND SURGERY Right   . RE-EXCISION OF BREAST LUMPECTOMY Right 05/15/2017   Procedure: RE-EXCISION OF BREAST LUMPECTOMY;  Surgeon: [name], MD;  Location: [place];  Service: General;  Laterality: Right;  . Septoplasty with turbinate reduction    . TONSILLECTOMY  1980  . VEIN SURGERY     Removal Right Leg  . WISDOM TOOTH EXTRACTION       ALLERGIES:  Allergies  Allergen Reactions  . Ace Inhibitors Other (See Comments)    Angina  . Aspirin   . Cefaclor   . Lisinopril Cough  . Losartan Potassium   . Oxycodone Hives  . Sulfonamide Derivatives      CURRENT MEDICATIONS:  Outpatient Encounter Medications as of 11/05/2017  Medication Sig Note  . acyclovir (ZOVIRAX) 400 MG tablet Take 1 tablet (400 mg total) by mouth 3 (three) times daily as needed.   Marland Kitchen azelastine (ASTELIN) 0.1 % nasal spray 2 sprays 2 (two) times daily. 09/15/2014: Received from: External Pharmacy Received Sig:   . budesonide-formoterol (SYMBICORT) 160-4.5 MCG/ACT inhaler Inhale 2 puffs into the lungs 2 (two) times daily.   . diazepam (VALIUM) 2 MG tablet Take 1 tablet (2 mg total) by mouth every 6 (six) hours as needed for anxiety. Do not combine with ambien   . EPINEPHrine 0.3 mg/0.3 mL IJ SOAJ injection epinephrine 0.3 mg/0.3 mL injection, auto-injector  TK UTD PRN   . exemestane (AROMASIN) 25 MG tablet Take 1 tablet (25 mg total) by mouth daily after breakfast.   . hyoscyamine (ANASPAZ) 0.125 MG TBDP disintergrating tablet hyoscyamine 0.125 mg disintegrating tablet   . lansoprazole (PREVACID) 30 MG capsule Take 30 mg by mouth daily at 12 noon.   . levalbuterol (XOPENEX HFA) 45 MCG/ACT inhaler Inhale 2 puffs into the lungs every 4 (four) hours as needed for wheezing (Q4-6 Hours PRN).   Marland Kitchen levocetirizine (XYZAL) 5 MG tablet levocetirizine 5 mg tablet  TK 1 T PO QD IN THE EVE   . levothyroxine (SYNTHROID, LEVOTHROID) 88 MCG tablet Take 1 tablet (88 mcg total) by mouth daily.   . mesalamine (LIALDA) 1.2 G EC tablet Take 2.4 g by mouth daily.   . montelukast (SINGULAIR) 10 MG tablet Take 10 mg by mouth at bedtime.    Marland Kitchen olmesartan  (BENICAR) 40 MG tablet TAKE 1 TABLET BY MOUTH EVERY DAY   . promethazine (PHENERGAN) 25 MG tablet TAKE 1/2-1 TABLET DAILY AS NEEDED FOR NAUSEA   . rosuvastatin (CRESTOR) 10 MG tablet Take 1 tablet (10 mg total) by mouth daily.   Marland Kitchen triamcinolone (NASACORT ALLERGY 24HR) 55 MCG/ACT AERO nasal inhaler Place 1 spray into the nose daily.   Marland Kitchen venlafaxine XR (EFFEXOR-XR) 150 MG 24 hr capsule TAKE 1 BY MOUTH EVERY DAY WITH BREAKFAST   . zolpidem (AMBIEN) 5 MG tablet TAKE 1 TABLET BY MOUTH EVERY DAY AT BEDTIME AS NEEDED. DO NOT COMBINE WITH ANY OTHER SEDATING  MEDICATION   . [DISCONTINUED] chlorpheniramine-HYDROcodone (TUSSIONEX PENNKINETIC ER) 10-8 MG/5ML SUER Take 5 mLs by mouth every 12 (twelve) hours as needed for cough (cough). (Patient not taking: Reported on 11/05/2017)   . [DISCONTINUED] hyaluronate sodium (RADIAPLEXRX) GEL Apply 1 application topically 2 (two) times daily.   . [DISCONTINUED] HYDROcodone-acetaminophen (NORCO/VICODIN) 5-325 MG tablet hydrocodone 5 mg-acetaminophen 325 mg tablet   . [DISCONTINUED] letrozole (FEMARA) 2.5 MG tablet Take 1 tablet (2.5 mg total) by mouth daily. (Patient not taking: Reported on 11/05/2017)   . [DISCONTINUED] non-metallic deodorant (ALRA) MISC Apply 1 application topically daily as needed.    No facility-administered encounter medications on file as of 11/05/2017.      ONCOLOGIC FAMILY HISTORY:  Family History  Problem Relation Age of Onset  . Arthritis Mother 66       Deceased  . Breast cancer Mother   . Lung cancer Mother   . Hyperlipidemia Mother   . Heart disease Mother   . Hypertension Mother   . Diabetes Mother   . Crohn's disease Mother   . Diabetes Maternal Grandfather   . Heart disease Maternal Grandfather   . Heart attack Maternal Grandfather   . Leukemia Maternal Grandfather   . Colitis Sister   . Leukemia Cousin 3       mat cousin     GENETIC COUNSELING/TESTING: Neg   SOCIAL HISTORY:  Social History   Socioeconomic History    . Marital status: Married    Spouse name: Not on file  . Number of children: Not on file  . Years of education: Not on file  . Highest education level: Not on file  Occupational History  . Not on file  Social Needs  . Financial resource strain: Not on file  . Food insecurity:    Worry: Not on file    Inability: Not on file  . Transportation needs:    Medical: Not on file    Non-medical: Not on file  Tobacco Use  . Smoking status: Former Smoker    Packs/day: 4.00    Years: 15.00    Pack years: 60.00    Types: Cigarettes    Last attempt to quit: 09/14/1993    Years since quitting: 24.1  . Smokeless tobacco: Never Used  . Tobacco comment: Quit >20 yrs ago  Substance and Sexual Activity  . Alcohol use: Yes    Comment: Drinks a couple every 2-3 months  . Drug use: No  . Sexual activity: Never    Comment: ablation  Lifestyle  . Physical activity:    Days per week: Not on file    Minutes per session: Not on file  . Stress: Not on file  Relationships  . Social connections:    Talks on phone: Not on file    Gets together: Not on file    Attends religious service: Not on file    Active member of club or organization: Not on file    Attends meetings of clubs or organizations: Not on file    Relationship status: Not on file  . Intimate partner violence:    Fear of current or ex partner: Not on file    Emotionally abused: Not on file    Physically abused: Not on file    Forced sexual activity: Not on file  Other Topics Concern  . Not on file  Social History Narrative  . Not on file      PHYSICAL EXAMINATION:  Vital Signs:   Vitals:  11/05/17 1406  BP: (!) 123/54  Pulse: 95  Resp: 18  Temp: 98.6 F (37 C)  SpO2: 97%   Filed Weights   11/05/17 1406  Weight: 180 lb (81.6 kg)   General: Well-nourished, well-appearing female in no acute distress.  She is unaccompanied today.   HEENT: Head is normocephalic.  Pupils equal and reactive to light. Conjunctivae  clear without exudate.  Sclerae anicteric. Oral mucosa is pink, moist.  Oropharynx is pink without lesions or erythema.  Lymph: No cervical, supraclavicular, or infraclavicular lymphadenopathy noted on palpation.  Cardiovascular: Regular rate and rhythm.Marland Kitchen Respiratory: Clear to auscultation bilaterally. Chest expansion symmetric; breathing non-labored.  GI: Abdomen soft and round; non-tender, non-distended. Bowel sounds normoactive.  GU: Deferred.  Neuro: No focal deficits. Steady gait.  Psych: Mood and affect normal and appropriate for situation.  Extremities: No edema. MSK: No focal spinal tenderness to palpation.  Full range of motion in bilateral upper extremities Skin: Warm and dry.  LABORATORY DATA:  None for this visit.  DIAGNOSTIC IMAGING:  None for this visit.      ASSESSMENT AND PLAN:  Ms.. Fields is a pleasant 52 y.o. 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  She presents to the Survivorship Clinic for our initial meeting and routine follow-up post-completion of treatment for breast cancer.    1. Stage IA right breast cancer:  Erin Fields is continuing to recover from definitive treatment for breast cancer. She will follow-up with her medical oncologist, Dr. Burr Medico in 3 months with history and physical exam per surveillance protocol.  She will continue her anti-estrogen therapy with Exemestane. Thus far, she is tolerating the Exemestane well, with minimal side effects. I will add on a CBC and coags today for her easy bruising.  Today, a comprehensive survivorship care plan and treatment summary was reviewed with the patient today detailing her breast cancer diagnosis, treatment course, potential late/long-term effects of treatment, appropriate follow-up care with recommendations for the future, and patient education  resources.  A copy of this summary, along with a letter will be sent to the patient's primary care provider via mail/fax/In Basket message after today's visit.    2. Bone health:  Given Erin Fields's age/history of breast cancer and her current treatment regimen including anti-estrogen therapy with Letrozole, she is at risk for bone demineralization.  Her last DEXA scan was 08/13/2017 and demonstrated a normal bone density.  She will need this repeated every 2 years while taking the Letrozole.  In the meantime, she was encouraged to increase her consumption of foods rich in calcium, as well as increase her weight-bearing activities.  She was given education on specific activities to promote bone health.  3. Cancer screening:  Due to Erin Fields history and her age, she should receive screening for skin cancers, colon cancer, and gynecologic cancers.  The information and recommendations are listed on the patient's comprehensive care plan/treatment summary and were reviewed in detail with the patient.    4. Health maintenance and wellness promotion: Erin Fields was encouraged to consume 5-7 servings of fruits and vegetables per day. We reviewed the "Nutrition Rainbow" handout, as well as the handout "Take Control of Your Health and Reduce Your Cancer Risk" from the [place].  She was also encouraged to engage in moderate to vigorous exercise for 30 minutes per day most days of the week. We discussed  the [employer] fitness program, which is designed for cancer survivors to help them become more physically fit after cancer treatments.  She was instructed to limit her alcohol consumption and continue to abstain from tobacco use.     5. Support services/counseling: It is not uncommon for this period of the patient's cancer care trajectory to be one of many emotions and stressors.  We discussed an opportunity for her to participate in the next session of [facility] ("Finding Your New Normal") support group  series designed for patients after they have completed treatment.   Erin Fields was encouraged to take advantage of our many other support services programs, support groups, and/or counseling in coping with her new life as a cancer survivor after completing anti-cancer treatment.  She was offered support today through active listening and expressive supportive counseling.  She was given information regarding our available services and encouraged to contact me with any questions or for help enrolling in any of our support group/programs.    Dispo:   -Return to cancer center in 3 months for f/u with Dr. Burr Medico  -Mammogram due in 07/2018 -Bone density 07/2019 -She is welcome to return back to the Survivorship Clinic at any time; no additional follow-up needed at this time.  -Consider referral back to survivorship as a long-term survivor for continued surveillance  A total of (30) minutes of face-to-face time was spent with this patient with greater than 50% of that time in counseling and care-coordination.   Gardenia Phlegm, NP Survivorship Program [facility] [phone]   Note: PRIMARY CARE PROVIDER Darreld Mclean, [place] [phone]

## 2017-11-05 NOTE — Telephone Encounter (Signed)
Gave patient avs. Patient scheduled per 7/22 los.

## 2017-11-05 NOTE — Telephone Encounter (Signed)
-----   Message from Gardenia Phlegm, NP sent at 11/05/2017  3:43 PM EDT ----- Labs are normal ----- Message ----- From: Interface, Lab In [place] Sent: 11/05/2017   3:05 PM To: Gardenia Phlegm, NP

## 2017-11-06 NOTE — Telephone Encounter (Signed)
Pt had already been discharged by another therapist.  No action taken  Donato Heinz. Owens Shark, PT

## 2017-11-19 ENCOUNTER — Encounter: Payer: Self-pay | Admitting: Family Medicine

## 2017-11-19 ENCOUNTER — Other Ambulatory Visit: Payer: Self-pay | Admitting: Family Medicine

## 2017-11-19 DIAGNOSIS — Z86010 Personal history of colonic polyps: Secondary | ICD-10-CM

## 2017-11-19 DIAGNOSIS — Z860101 Personal history of adenomatous and serrated colon polyps: Secondary | ICD-10-CM

## 2017-11-19 DIAGNOSIS — F5104 Psychophysiologic insomnia: Secondary | ICD-10-CM

## 2017-11-19 HISTORY — DX: Personal history of adenomatous and serrated colon polyps: Z86.0101

## 2017-11-19 HISTORY — DX: Personal history of colonic polyps: Z86.010

## 2017-11-19 MED ORDER — ROSUVASTATIN CALCIUM 10 MG PO TABS: 10.0000 mg | ORAL_TABLET | Freq: Every day | ORAL | 3 refills | Status: DC

## 2017-11-20 NOTE — Telephone Encounter (Signed)
Requesting:Ambien Contract:none, needs csc AES:LPNP, needs uds Last Visit:09/24/17 Next Visit:none with pcp Last Refill:09/18/17 1 refill  Please Advise

## 2017-11-22 ENCOUNTER — Encounter: Payer: Self-pay | Admitting: Family Medicine

## 2017-11-29 ENCOUNTER — Ambulatory Visit: Payer: Self-pay | Admitting: Pulmonary Disease

## 2017-12-09 ENCOUNTER — Other Ambulatory Visit: Payer: Self-pay | Admitting: Hematology

## 2017-12-11 ENCOUNTER — Encounter: Payer: Self-pay | Admitting: Hematology

## 2017-12-11 ENCOUNTER — Other Ambulatory Visit: Payer: Self-pay

## 2017-12-11 DIAGNOSIS — Z170 Estrogen receptor positive status [ER+]: Secondary | ICD-10-CM

## 2017-12-11 DIAGNOSIS — C50411 Malignant neoplasm of upper-outer quadrant of right female breast: Secondary | ICD-10-CM

## 2017-12-11 MED ORDER — EXEMESTANE 25 MG PO TABS: 25.0000 mg | ORAL_TABLET | Freq: Every day | ORAL | 1 refills | Status: DC

## 2017-12-12 ENCOUNTER — Encounter: Payer: Self-pay | Admitting: Pulmonary Disease

## 2017-12-12 ENCOUNTER — Ambulatory Visit: Payer: BLUE CROSS/BLUE SHIELD | Admitting: Pulmonary Disease

## 2017-12-12 VITALS — BP 124/78 | HR 96 | Temp 98.0°F | Ht 63.5 in | Wt 181.6 lb

## 2017-12-12 DIAGNOSIS — F419 Anxiety disorder, unspecified: Secondary | ICD-10-CM

## 2017-12-12 DIAGNOSIS — J301 Allergic rhinitis due to pollen: Secondary | ICD-10-CM | POA: Diagnosis not present

## 2017-12-12 DIAGNOSIS — F329 Major depressive disorder, single episode, unspecified: Secondary | ICD-10-CM

## 2017-12-12 DIAGNOSIS — K5100 Ulcerative (chronic) pancolitis without complications: Secondary | ICD-10-CM

## 2017-12-12 DIAGNOSIS — E782 Mixed hyperlipidemia: Secondary | ICD-10-CM

## 2017-12-12 DIAGNOSIS — C50411 Malignant neoplasm of upper-outer quadrant of right female breast: Secondary | ICD-10-CM

## 2017-12-12 DIAGNOSIS — F32A Depression, unspecified: Secondary | ICD-10-CM

## 2017-12-12 DIAGNOSIS — R7301 Impaired fasting glucose: Secondary | ICD-10-CM | POA: Insufficient documentation

## 2017-12-12 DIAGNOSIS — E039 Hypothyroidism, unspecified: Secondary | ICD-10-CM

## 2017-12-12 DIAGNOSIS — Z87891 Personal history of nicotine dependence: Secondary | ICD-10-CM

## 2017-12-12 DIAGNOSIS — I10 Essential (primary) hypertension: Secondary | ICD-10-CM

## 2017-12-12 DIAGNOSIS — Z170 Estrogen receptor positive status [ER+]: Secondary | ICD-10-CM

## 2017-12-12 DIAGNOSIS — I872 Venous insufficiency (chronic) (peripheral): Secondary | ICD-10-CM

## 2017-12-12 DIAGNOSIS — J4530 Mild persistent asthma, uncomplicated: Secondary | ICD-10-CM | POA: Diagnosis not present

## 2017-12-12 HISTORY — DX: Impaired fasting glucose: R73.01

## 2017-12-12 NOTE — Patient Instructions (Signed)
Today we updated your med list in our EPIC system...    Continue your current medications the same...  OK to use the Xopenex rescue inhaler as we discussed...  Stay as active as possible...  Call for any questions or if we can be of service in any way...  We will arrange for a follow up visit in about 6 months w/ one of my young partners!

## 2017-12-13 ENCOUNTER — Other Ambulatory Visit: Payer: Self-pay

## 2017-12-13 ENCOUNTER — Ambulatory Visit
Admission: RE | Admit: 2017-12-13 | Discharge: 2017-12-13 | Disposition: A | Discharge status: Home / Self Care | Payer: BLUE CROSS/BLUE SHIELD | Source: Ambulatory Visit | Attending: Radiation Oncology | Admitting: Radiation Oncology

## 2017-12-13 ENCOUNTER — Encounter: Payer: Self-pay | Admitting: Radiation Oncology

## 2017-12-13 VITALS — BP 133/79 | HR 100 | Temp 98.4°F | Resp 20 | Ht 63.5 in | Wt 181.8 lb

## 2017-12-13 DIAGNOSIS — Z853 Personal history of malignant neoplasm of breast: Secondary | ICD-10-CM | POA: Insufficient documentation

## 2017-12-13 DIAGNOSIS — Z923 Personal history of irradiation: Secondary | ICD-10-CM | POA: Diagnosis not present

## 2017-12-13 DIAGNOSIS — Z08 Encounter for follow-up examination after completed treatment for malignant neoplasm: Secondary | ICD-10-CM | POA: Insufficient documentation

## 2017-12-13 DIAGNOSIS — C50411 Malignant neoplasm of upper-outer quadrant of right female breast: Secondary | ICD-10-CM

## 2017-12-13 DIAGNOSIS — F3289 Other specified depressive episodes: Secondary | ICD-10-CM | POA: Insufficient documentation

## 2017-12-13 DIAGNOSIS — Z888 Allergy status to other drugs, medicaments and biological substances status: Secondary | ICD-10-CM | POA: Diagnosis not present

## 2017-12-13 DIAGNOSIS — Z79890 Hormone replacement therapy (postmenopausal): Secondary | ICD-10-CM | POA: Diagnosis not present

## 2017-12-13 DIAGNOSIS — R109 Unspecified abdominal pain: Secondary | ICD-10-CM | POA: Insufficient documentation

## 2017-12-13 DIAGNOSIS — Z79899 Other long term (current) drug therapy: Secondary | ICD-10-CM | POA: Diagnosis not present

## 2017-12-13 DIAGNOSIS — Z885 Allergy status to narcotic agent status: Secondary | ICD-10-CM | POA: Insufficient documentation

## 2017-12-13 DIAGNOSIS — Z886 Allergy status to analgesic agent status: Secondary | ICD-10-CM | POA: Insufficient documentation

## 2017-12-13 DIAGNOSIS — Z881 Allergy status to other antibiotic agents status: Secondary | ICD-10-CM | POA: Diagnosis not present

## 2017-12-13 DIAGNOSIS — F329 Major depressive disorder, single episode, unspecified: Secondary | ICD-10-CM | POA: Diagnosis not present

## 2017-12-13 DIAGNOSIS — Z882 Allergy status to sulfonamides status: Secondary | ICD-10-CM | POA: Diagnosis not present

## 2017-12-13 DIAGNOSIS — Z170 Estrogen receptor positive status [ER+]: Secondary | ICD-10-CM

## 2017-12-13 HISTORY — DX: Unspecified abdominal pain: R10.9

## 2017-12-13 NOTE — Progress Notes (Signed)
Radiation Oncology         [phone] ________________________________  Name: Erin Fields MRN: [id number]  Date: 12/13/2017  DOB: [birth year]  Follow-Up Visit Note  CC: Erin Fields, Erin Filler, MD  Erin Klein, MD    ICD-10-CM   1. Other depression F32.89 Ambulatory referral to Social Work    Diagnosis:   52 y.o. female with malignant neoplasm of upper-outer quadrant of right breast in female, estrogen receptor positive (Erin Fields) Staging form: Breast, AJCC 8th Edition - Clinical stage from 03/16/2017: Stage IA (cT1b, cN0, cM0, G2, ER: Positive, PR: Positive, HER2: Negative) - Pathologic Stage pT2, pN0 (i+), pMX   Interval Since Last Radiation:  4 months 06/26/2017 - 08/09/2017: 1. Right Breast / 50.4 Gy delivered in 28 fractions of 1.8 Gy 2. Right Breast Boost / 10 Gy delivered in 5 fractions of 2 Gy  Narrative:  The patient returns today for routine follow-up. She last presented to the [facility] on 11/05/17 to the Survivorship clinic.  She denies swelling to her arm. She reports moderate fatigue, breast tenderness and skin hyperpigmentation. She notes occasional tingling down her arm, which she states could be related to carpal tunnel.              She expresses recent concerns with depression in relation to the anniversary of her husband's death a year ago in 09/16/2022 and his birthday last week. She notes he was killed while on his motorcycle by a intoxicated  driver.  ALLERGIES:  is allergic to ace inhibitors; aspirin; cefaclor; lisinopril; losartan potassium; oxycodone; and sulfonamide derivatives.  Meds: Current Outpatient Medications  Medication Sig Dispense Refill  . acyclovir (ZOVIRAX) 400 MG tablet Take 1 tablet (400 mg total) by mouth 3 (three) times daily as needed. 30 tablet 5  . azelastine (ASTELIN) 0.1 % nasal spray 2 sprays 2 (two) times daily.  6  . budesonide-formoterol (SYMBICORT) 160-4.5 MCG/ACT inhaler Inhale 2 puffs into the lungs 2 (two) times daily.    .  diazepam (VALIUM) 2 MG tablet Take 1 tablet (2 mg total) by mouth every 6 (six) hours as needed for anxiety. Do not combine with ambien 30 tablet 1  . Diclofenac Sodium (PENNSAID) 2 % SOLN Pennsaid 20 mg/gram/actuation (2 %) topical soln in metered-dose pump  APPLY TWO PUMPS TOPICALLY TO THE AFFECTED AREA TWICE DAILY    . EPINEPHrine 0.3 mg/0.3 mL IJ SOAJ injection epinephrine 0.3 mg/0.3 mL injection, auto-injector  TK UTD PRN    . exemestane (AROMASIN) 25 MG tablet Take 1 tablet (25 mg total) by mouth daily after breakfast. 90 tablet 1  . hyoscyamine (ANASPAZ) 0.125 MG TBDP disintergrating tablet hyoscyamine 0.125 mg disintegrating tablet    . lansoprazole (PREVACID) 30 MG capsule Take 30 mg by mouth daily at 12 noon.    . levalbuterol (XOPENEX HFA) 45 MCG/ACT inhaler Inhale 2 puffs into the lungs every 4 (four) hours as needed for wheezing (Q4-6 Hours PRN).    Marland Kitchen levocetirizine (XYZAL) 5 MG tablet levocetirizine 5 mg tablet  TK 1 T PO QD IN THE EVE    . levothyroxine (SYNTHROID, LEVOTHROID) 88 MCG tablet Take 1 tablet (88 mcg total) by mouth daily. 90 tablet 3  . mesalamine (LIALDA) 1.2 G EC tablet Take 2.4 g by mouth daily.    . montelukast (SINGULAIR) 10 MG tablet Take 10 mg by mouth at bedtime.     Marland Kitchen olmesartan (BENICAR) 40 MG tablet TAKE 1 TABLET BY MOUTH EVERY DAY 90 tablet 1  .  promethazine (PHENERGAN) 25 MG tablet TAKE 1/2-1 TABLET DAILY AS NEEDED FOR NAUSEA 30 tablet 0  . rosuvastatin (CRESTOR) 10 MG tablet Take 1 tablet (10 mg total) by mouth daily. 90 tablet 3  . triamcinolone (NASACORT ALLERGY 24HR) 55 MCG/ACT AERO nasal inhaler Place 1 spray into the nose daily.    Marland Kitchen venlafaxine XR (EFFEXOR-XR) 150 MG 24 hr capsule TAKE 1 BY MOUTH EVERY DAY WITH BREAKFAST 90 capsule 3  . zolpidem (AMBIEN) 5 MG tablet TAKE 1 TABLET BY MOUTH AT BEDTIME AS NEEDED FOR SLEEP 30 tablet 1   No current facility-administered medications for this encounter.     Physical Findings: The patient is in no  acute distress. Patient is alert and oriented.  height is 5' 3.5" (1.613 m) and weight is 181 lb 12.8 oz (82.5 kg). Her oral temperature is 98.4 F (36.9 C). Her blood pressure is 133/79 and her pulse is 100. Her respiration is 20 and oxygen saturation is 20% (abnormal).   Lungs are clear to auscultation bilaterally. Heart has regular rate and rhythm. No palpable cervical, supraclavicular, or axillary adenopathy. Abdomen soft, non-tender, normal bowel sounds.  Breast Exam Right Breast: Patient continues to have some hyperpigmentation changes in the breast, and some mild to moderate edema. This edema does not, however, seem to cause her any discomfort or pain. She has some induration in the upper-outer aspect of the breast, but no palpable masses appreciated. Left Breast: No palpable mass, nipple discharge, or bleeding  Lab Findings: Lab Results  Component Value Date   WBC 8.1 11/05/2017   HGB 13.3 11/05/2017   HCT 40.5 11/05/2017   MCV 88.8 11/05/2017   PLT 333 11/05/2017    Radiographic Findings: No results found.  Impression:  Malignant neoplasm of upper-outer quadrant of right breast No evidence of recurrence on clinical exam. Patient has been struggling with depression related to the recent birthday of her late husband. She has requested a referral to our Education officer, museum.  Plan: She will follow up in radiation oncology as needed. She is followed closely by her surgeon, Dr. Barry Fields. The patient is scheduled to follow up with Dr. Burr Fields on 02/07/18. We will schedule a consultation with the social worker in the near future.  ____________________________________  Erin Promise, PhD, MD  This document serves as a record of services personally performed by Erin Pray, MD. It was created on his behalf by Erin Fields, a trained medical scribe. The creation of this record is based on the scribe's personal observations and the provider's statements to them. This document has been  checked and approved by the attending provider.

## 2017-12-13 NOTE — Progress Notes (Signed)
Pt presents today for f/u with Dr. [name]. Pt expresses depression and requests SW consult to speak with someone. SW consult ordered. Pt reports moderate fatigue and attributes it to the oral anti-estrogen medication she is taking for her breast cancer. Pt reports breast is "tender" and pt c/o axilla. Pt without skin concerns. Pt reports skin is hyperpigmented. Pt is unaccompanied.   BP 133/79 (BP Location: Left Arm, Patient Position: Sitting)   Pulse 100   Temp 98.4 F (36.9 C) (Oral)   Resp 20   Ht 5' 3.5" (1.613 m)   Wt 181 lb 12.8 oz (82.5 kg)   SpO2 (!) 20%   BMI 31.70 kg/m   Wt Readings from Last 3 Encounters:  12/13/17 181 lb 12.8 oz (82.5 kg)  12/12/17 181 lb 9.6 oz (82.4 kg)  11/05/17 180 lb (81.6 kg)   Loma Sousa, RN BSN

## 2017-12-26 ENCOUNTER — Other Ambulatory Visit: Payer: Self-pay | Admitting: Family Medicine

## 2017-12-26 ENCOUNTER — Encounter: Payer: Self-pay | Admitting: Family Medicine

## 2017-12-26 DIAGNOSIS — B001 Herpesviral vesicular dermatitis: Secondary | ICD-10-CM

## 2017-12-26 DIAGNOSIS — E034 Atrophy of thyroid (acquired): Secondary | ICD-10-CM

## 2018-01-10 ENCOUNTER — Encounter: Payer: Self-pay | Admitting: Primary Care

## 2018-01-10 ENCOUNTER — Ambulatory Visit: Payer: BLUE CROSS/BLUE SHIELD | Admitting: Primary Care

## 2018-01-10 VITALS — BP 128/80 | HR 92 | Ht 63.5 in | Wt 179.8 lb

## 2018-01-10 DIAGNOSIS — R059 Cough, unspecified: Secondary | ICD-10-CM

## 2018-01-10 DIAGNOSIS — J0100 Acute maxillary sinusitis, unspecified: Secondary | ICD-10-CM | POA: Diagnosis not present

## 2018-01-10 DIAGNOSIS — R05 Cough: Secondary | ICD-10-CM | POA: Diagnosis not present

## 2018-01-10 DIAGNOSIS — R911 Solitary pulmonary nodule: Secondary | ICD-10-CM

## 2018-01-10 DIAGNOSIS — J019 Acute sinusitis, unspecified: Secondary | ICD-10-CM

## 2018-01-10 DIAGNOSIS — R918 Other nonspecific abnormal finding of lung field: Secondary | ICD-10-CM

## 2018-01-10 DIAGNOSIS — J339 Nasal polyp, unspecified: Secondary | ICD-10-CM | POA: Insufficient documentation

## 2018-01-10 HISTORY — DX: Nasal polyp, unspecified: J33.9

## 2018-01-10 HISTORY — DX: Solitary pulmonary nodule: R91.1

## 2018-01-10 MED ORDER — DOXYCYCLINE HYCLATE 100 MG PO TABS: 100.0000 mg | ORAL_TABLET | Freq: Two times a day (BID) | ORAL | 0 refills | Status: DC

## 2018-01-10 MED ORDER — PREDNISONE 10 MG PO TABS: ORAL_TABLET | ORAL | 0 refills | Status: DC

## 2018-01-10 MED ORDER — METHYLPREDNISOLONE ACETATE 80 MG/ML IJ SUSP
80.0000 mg | Freq: Once | INTRAMUSCULAR | Status: AC
  Administered 2018-01-10: 80 mg via INTRAMUSCULAR

## 2018-01-10 NOTE — Assessment & Plan Note (Addendum)
Large right nasal polyp, bright red/inflamed mucosal lining  Add antihistamine  Continue singulair and Nasacort spray  Oral steroid course  Doxycyline  Consider referral to ENT

## 2018-01-10 NOTE — Assessment & Plan Note (Addendum)
No improvement with Augmentin  Change abx to doxycyline and prednisone taper  Received depo-medrol 74m IM injection today FU in 7-10 days

## 2018-01-10 NOTE — Assessment & Plan Note (Signed)
Stable

## 2018-01-10 NOTE — Progress Notes (Signed)
@Patient  ID: Erin Fields, female    DOB: 1965-12-21, 52 y.o.   MRN: [id number]  Chief Complaint  Patient presents with  . Acute Visit    congestion and cough with green mucus    Referring provider: Copland, Gay Filler, MD  HPI: 52 year old female, former smoker quit in 1995 (60 pack year hx). PMH asthma, allergic rhinitis, recurrent bronchitis and sinusitis. Patient of Dr. Lenna Gilford, last seen 12/12/17.  01/10/2018 Patient presents today for acute visit with complaints of sinus congestion and cough x several weeks. Feels her symptoms are mostly sinus related. Audible nasal congestion. Cough is congested with tan/green mucus. Went to PCP and received Augmentin x 10 days and prednisone. States that cough initally improved with prednisone. Breathing is ok with no reports of wheezing. Continues singular and nasal spray. She has 2 cats at home, get allergy shots.    Testing reviewed: > Spirometry 09/15/14 showed FVC=2.75 (87%), FEV1=2.33 (90%), %1sec=85, mid-flows were wnl at 105% predicted > IgE level= 129, RAST panel all neg x Ragweed at 0.16 (low titer) > CT Chest >> done 09/18/14 & showed norm heart size, min atherosclerotic changes in Ao, no adenopathy; tiny 56m RUL nodules, min emphysema, otherw clear lungs; asymmetric elev of right hemidiaph, mild emphysema      Allergies  Allergen Reactions  . Ace Inhibitors Other (See Comments)    Angina  . Aspirin   . Cefaclor   . Lisinopril Cough  . Losartan Potassium   . Oxycodone Hives  . Sulfonamide Derivatives     Immunization History  Administered Date(s) Administered  . Influenza Inj Mdck Quad Pf 01/21/2016  . Influenza,inj,Quad PF,6+ Mos 12/20/2016  . Influenza-Unspecified 12/16/2012, 01/15/2014, 01/16/2016, 12/16/2016  . Pneumococcal Polysaccharide-23 04/17/2004, 04/14/2013  . Td 04/17/1997  . Tdap 07/22/2008    Past Medical History:  Diagnosis Date  . Allergy   . Anxiety   . Asthma   . Cancer ([facility]    right breast cancer    . Chicken pox   . Colon polyps   . Depression   . Elevated blood pressure   . Environmental allergies   . Family history of cancer   . Frequent headaches   . GERD (gastroesophageal reflux disease)   . Hay fever   . Hyperlipidemia   . Hypertension   . Hypothyroidism   . Migraines   . Personal history of radiation therapy   . PONV (postoperative nausea and vomiting)   . Thyroid disease   . Ulcerative colitis ([place]   . Varicose veins     Tobacco History: Social History   Tobacco Use  Smoking Status Former Smoker  . Packs/day: 4.00  . Years: 15.00  . Pack years: 60.00  . Types: Cigarettes  . Last attempt to quit: 09/14/1993  . Years since quitting: 24.3  Smokeless Tobacco Never Used  Tobacco Comment   Quit >20 yrs ago   Counseling given: Not Answered Comment: Quit >20 yrs ago   Outpatient Medications Prior to Visit  Medication Sig Dispense Refill  . acyclovir (ZOVIRAX) 400 MG tablet TAKE 1 TABLET BY MOUTH THREE TIMES A DAY AS NEEDED 90 tablet 1  . azelastine (ASTELIN) 0.1 % nasal spray 2 sprays 2 (two) times daily.  6  . budesonide-formoterol (SYMBICORT) 160-4.5 MCG/ACT inhaler Inhale 2 puffs into the lungs 2 (two) times daily.    . diazepam (VALIUM) 2 MG tablet Take 1 tablet (2 mg total) by mouth every 6 (six) hours as needed for  anxiety. Do not combine with ambien 30 tablet 1  . Diclofenac Sodium (PENNSAID) 2 % SOLN Pennsaid 20 mg/gram/actuation (2 %) topical soln in metered-dose pump  APPLY TWO PUMPS TOPICALLY TO THE AFFECTED AREA TWICE DAILY    . EPINEPHrine 0.3 mg/0.3 mL IJ SOAJ injection epinephrine 0.3 mg/0.3 mL injection, auto-injector  TK UTD PRN    . exemestane (AROMASIN) 25 MG tablet Take 1 tablet (25 mg total) by mouth daily after breakfast. 90 tablet 1  . hyoscyamine (ANASPAZ) 0.125 MG TBDP disintergrating tablet hyoscyamine 0.125 mg disintegrating tablet    . lansoprazole (PREVACID) 30 MG capsule Take 30 mg by mouth daily at 12 noon.    . levalbuterol  (XOPENEX HFA) 45 MCG/ACT inhaler Inhale 2 puffs into the lungs every 4 (four) hours as needed for wheezing (Q4-6 Hours PRN).    Marland Kitchen levocetirizine (XYZAL) 5 MG tablet levocetirizine 5 mg tablet  TK 1 T PO QD IN THE EVE    . levothyroxine (SYNTHROID, LEVOTHROID) 88 MCG tablet Take 1 tablet (88 mcg total) by mouth daily. 90 tablet 3  . levothyroxine (SYNTHROID, LEVOTHROID) 88 MCG tablet Take 6 tablets (530 mcg total) by mouth daily before breakfast. 90 tablet 1  . mesalamine (LIALDA) 1.2 G EC tablet Take 2.4 g by mouth daily.    . montelukast (SINGULAIR) 10 MG tablet Take 10 mg by mouth at bedtime.     Marland Kitchen olmesartan (BENICAR) 40 MG tablet TAKE 1 TABLET BY MOUTH EVERY DAY 90 tablet 1  . promethazine (PHENERGAN) 25 MG tablet TAKE 1/2-1 TABLET DAILY AS NEEDED FOR NAUSEA 30 tablet 0  . rosuvastatin (CRESTOR) 10 MG tablet Take 1 tablet (10 mg total) by mouth daily. 90 tablet 3  . triamcinolone (NASACORT ALLERGY 24HR) 55 MCG/ACT AERO nasal inhaler Place 1 spray into the nose daily.    Marland Kitchen venlafaxine XR (EFFEXOR-XR) 150 MG 24 hr capsule TAKE 1 BY MOUTH EVERY DAY WITH BREAKFAST 90 capsule 3  . zolpidem (AMBIEN) 5 MG tablet TAKE 1 TABLET BY MOUTH AT BEDTIME AS NEEDED FOR SLEEP 30 tablet 1   No facility-administered medications prior to visit.     Review of Systems  Review of Systems  HENT: Positive for postnasal drip, sinus pressure and sinus pain.   Respiratory: Positive for cough. Negative for shortness of breath and wheezing.   Cardiovascular: Negative.     Physical Exam  BP 128/80 (BP Location: Left Arm, Cuff Size: Normal)   Pulse 92   Ht 5' 3.5" (1.613 m)   Wt 179 lb 12.8 oz (81.6 kg)   SpO2 97%   BMI 31.35 kg/m  Physical Exam  Constitutional: She appears well-developed and well-nourished. No distress.  HENT:  Head: Normocephalic.  Right Ear: Hearing, tympanic membrane and ear canal normal.  Left Ear: Hearing, tympanic membrane and ear canal normal.  Nose: Mucosal edema and rhinorrhea  present. Right sinus exhibits maxillary sinus tenderness. Left sinus exhibits maxillary sinus tenderness.  Mouth/Throat: Uvula is midline and oropharynx is clear and moist.  Large right nasal polyp, bright red mucosal lining Audible nasal congestion   Eyes: Pupils are equal, round, and reactive to light. EOM are normal.  Neck: Normal range of motion. Neck supple.  Cardiovascular: Normal rate and regular rhythm.  Pulmonary/Chest: Effort normal and breath sounds normal. No stridor. No respiratory distress. She has no wheezes.  Musculoskeletal: Normal range of motion.  Neurological: She is alert.  Skin: Skin is warm and dry.  Psychiatric: She has a normal mood  and affect. Her behavior is normal. Judgment and thought content normal.     Lab Results:  CBC    Component Value Date/Time   WBC 8.1 11/05/2017 1455   WBC 9.6 03/28/2017 0823   WBC 8.9 08/30/2015 0800   RBC 4.56 11/05/2017 1455   HGB 13.3 11/05/2017 1455   HGB 13.6 03/28/2017 0823   HCT 40.5 11/05/2017 1455   HCT 41.9 03/28/2017 0823   PLT 333 11/05/2017 1455   PLT 341 03/28/2017 0823   MCV 88.8 11/05/2017 1455   MCV 88.6 03/28/2017 0823   MCH 29.2 11/05/2017 1455   MCHC 32.8 11/05/2017 1455   RDW 12.8 11/05/2017 1455   RDW 12.7 03/28/2017 0823   LYMPHSABS 2.2 11/05/2017 1455   LYMPHSABS 3.3 03/28/2017 0823   MONOABS 0.7 11/05/2017 1455   MONOABS 0.7 03/28/2017 0823   EOSABS 0.2 11/05/2017 1455   EOSABS 0.3 03/28/2017 0823   BASOSABS 0.1 11/05/2017 1455   BASOSABS 0.1 03/28/2017 0823    BMET    Component Value Date/Time   NA 141 03/28/2017 0823   K 3.7 03/28/2017 0823   CL 106 12/20/2016 0952   CO2 23 03/28/2017 0823   GLUCOSE 160 (H) 03/28/2017 0823   BUN 7.0 03/28/2017 0823   CREATININE 0.8 03/28/2017 0823   CALCIUM 9.5 03/28/2017 0823   GFRNONAA >89 04/14/2013 0829   GFRAA >89 04/14/2013 0829    BNP No results found for: BNP  ProBNP No results found for: PROBNP  Imaging: No results  found.   Assessment & Plan:   Right upper lobe pulmonary nodule CT chest on 09/28/14 showed tiny 64m RUL nodules  Previous hx of smoking Recommend repeat CT chest   Acute sinusitis with symptoms greater than 10 days No improvement with Augmentin  Change abx to doxycyline and prednisone taper  Received depo-medrol 813mIM injection today FU in 7-10 days    Nasal polyp, unspecified Large right nasal polyp, bright red/inflamed mucosal lining  Add antihistamine  Continue singulair and Nasacort spray  Oral steroid course  Doxycyline  Consider referral to ENT  Asthma Stable     ElMartyn EhrichNP 01/10/2018

## 2018-01-10 NOTE — Patient Instructions (Signed)
Office treatment: Depo-medrol injection today   RX: Doxycline twice daily x 7 days Prednisone taper   Testing: CT chest without contrast   Recommendations: Add antihistamine (claritin or Zyrtec daily) Continue Mucinex DM and delsym cough syrup twice a day x 1 week  Continue nasal sprays   FU in 7-10 days with Eustaquio Maize NP or Dr. Lenna Gilford

## 2018-01-10 NOTE — Assessment & Plan Note (Signed)
CT chest on 09/28/14 showed tiny 59m RUL nodules  Previous hx of smoking Recommend repeat CT chest

## 2018-01-14 ENCOUNTER — Other Ambulatory Visit: Payer: Self-pay | Admitting: Family Medicine

## 2018-01-15 ENCOUNTER — Encounter: Payer: Self-pay | Admitting: Family Medicine

## 2018-01-15 MED ORDER — PROMETHAZINE HCL 25 MG PO TABS: ORAL_TABLET | ORAL | 0 refills | Status: DC

## 2018-01-15 NOTE — Telephone Encounter (Signed)
Promethazine rx refill requested, last refilled 03/22/17, #30, 0RF. LOV 09/24/17 with no follow up scheduled. Routed to Dr. Lorelei Pont to review pended order.

## 2018-01-16 ENCOUNTER — Ambulatory Visit (HOSPITAL_BASED_OUTPATIENT_CLINIC_OR_DEPARTMENT_OTHER)
Admission: RE | Admit: 2018-01-16 | Discharge: 2018-01-16 | Disposition: A | Discharge status: Home / Self Care | Payer: BLUE CROSS/BLUE SHIELD | Source: Ambulatory Visit | Attending: Primary Care | Admitting: Primary Care

## 2018-01-16 ENCOUNTER — Encounter (HOSPITAL_BASED_OUTPATIENT_CLINIC_OR_DEPARTMENT_OTHER): Payer: Self-pay

## 2018-01-16 DIAGNOSIS — R911 Solitary pulmonary nodule: Secondary | ICD-10-CM | POA: Diagnosis not present

## 2018-01-16 DIAGNOSIS — R918 Other nonspecific abnormal finding of lung field: Secondary | ICD-10-CM

## 2018-01-16 DIAGNOSIS — I700 Atherosclerosis of aorta: Secondary | ICD-10-CM | POA: Insufficient documentation

## 2018-01-16 DIAGNOSIS — Z923 Personal history of irradiation: Secondary | ICD-10-CM | POA: Diagnosis not present

## 2018-01-17 ENCOUNTER — Other Ambulatory Visit: Payer: Self-pay | Admitting: Family Medicine

## 2018-01-17 DIAGNOSIS — F5104 Psychophysiologic insomnia: Secondary | ICD-10-CM

## 2018-01-18 NOTE — Telephone Encounter (Signed)
Requesting:Ambien Contract:none, needs csc ATV:VLRT, needs uds Last Visit:09/24/17 Next Visit:none Last Refill:11/20/17 1 refill  Please Advise

## 2018-01-20 ENCOUNTER — Other Ambulatory Visit: Payer: Self-pay | Admitting: Family Medicine

## 2018-01-20 DIAGNOSIS — F32A Depression, unspecified: Secondary | ICD-10-CM

## 2018-01-20 DIAGNOSIS — B001 Herpesviral vesicular dermatitis: Secondary | ICD-10-CM

## 2018-01-20 DIAGNOSIS — F329 Major depressive disorder, single episode, unspecified: Secondary | ICD-10-CM | POA: Insufficient documentation

## 2018-01-20 DIAGNOSIS — F419 Anxiety disorder, unspecified: Secondary | ICD-10-CM

## 2018-01-20 HISTORY — DX: Depression, unspecified: F32.A

## 2018-01-20 HISTORY — DX: Anxiety disorder, unspecified: F41.9

## 2018-01-20 NOTE — Progress Notes (Signed)
Subjective:     Patient ID: Erin Fields, female   DOB: 28-Apr-1965, 52 y.o.   MRN: [id number]  HPI ~  Sep 15, 2014:  Initial pulmonary consult by SN>        77 y/o WF, referred by Erin Aquas PA at [facility], for pulmonary evaluation>  She has a Fields Asthma, AR and recurrent bronchitis & sinusitis, currently managed by Erin Fields- she was prev seen by Erin Fields in 2006 but all records are on the old paper charts;  Erin Fields is c/o 38mohx cough, prod of a small amt of green phlegm, and increased SOB/DOE progressing to problem w/ ADLs recently; she notes occ left CP "like stress on my heart", worse w/ a deep breath and when questioned further the SOB she is experiencing is a sensation that she can't get the air "IN", can't get a good deep breath & doesn't feel satisfied breathing; she also reports a Fields of GERD & Crohn's dis- followed by Erin Fields& she takes ?OTC Prevacid Bid but not following an anti-reflux regimen...      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       Current Meds> Dulera200- 2spBid, Singulair10, XopenexHFA (using it 1-2 times daily she says); Astelin- 2spBid, Nasacort- 2spQhs, Tussionex prn, on Allergy shots for >20y105yrrev testing allergic to everything she says) EXAM shows Afeb, VSS, O2sat=96% on RA;  HEENT- neg, mallampati1;  Chest- clear w/o w/r/r;  Heart- RR  w/o m/r/g;  Abd- soft, neg;  Ext- w/o c/c/e...   Old data from Paper Chart is not available to review...  CXR 08/13/14 showed norm heart size, mild right diaph eventration, clear lungs, NAD...   LABS 5/16:  Chems- wnl x BS=122, A1c=6.4;  CBC- wnl, diff not done- older data shows norm diff (1%eos);  TSH=0.18;   She had Erin Fields UTI 5/16 treated w/ Ceftin500Bid...   Spirometry 09/15/14 showed FVC=2.75 (87%), FEV1=2.33 (90%), %1sec=85, mid-flows were wnl at 105% predicted...   Additional LABS> IgE level= 129, RAST panel all neg x Ragweed at 0.16 (low titer)...   CT Chest >> done 09/18/14 & showed norm heart size, min atherosclerotic changes in Ao, no adenopathy; tiny 3mm 81m nodules, min emphysema, otherw clear lungs; asymmetric elev of right hemidiaph...  IMP/PLAN>>  Erin Fields, but her current PFTs are wnl mitigating against COPD & in favor of her Asthma diagnosis;  She appears to be well controlled on her current regimen w/ Dulera, Singulair, Xopenex, Tussionex;  The IgE level is sl elev but the RAST panel is ok x low level Ragweed antibodies;  She appears to get freq upper resp (sinus/ bronchitis) & this exacerbates her asthma;  In addition she has a long Fields of GERD on OTC Prevacid  Bid but no following an antireflux regimen- we reviewed the needed elev HOB, NPO after dinner, etc... She is asked to start back on an exercise program eg- bike or swimming (non-weight bearing due to her knee).  ~  November 17, 2014:  71moROV w/ SN>  Erin Fields that she is improved- "it's all good" she says; cough & sput diminished and breathing improved...    Allergic Rhinitis> IgE level was 129 but RAST tests all neg x ragweed; on Singulair, OTC Antihist prn & Nasacort...    Asthma & AB> CXR w/ sl elev of right diaph & CT w/ tiny 376mRUL nodules; on AdvairHFA-110 2spBid (due to insurance) & improved, reminded to rinse etc...    Ex-smoker> former heavy smoker but quit cold [place] ago...  Spirometry wnl 5/16.    GERD> on Prevacid30Bid, followed by Erin Fields reviewed the antireflux regimen...    Medical issues include> HBP, HL, GERD, Crohn's, DJD, HAs, Depression... PCP is Erin Aquast [place]We reviewed prob list, meds, xrays and labs>>  IMP/PLAN>>  [name] reassured regarding her lung health despite her remote smoking Fields; breathing is improved on the Advair110HFA inhaler; she will continue this & her allergy regimen w/ Singulair, antihist, Nasacort, etc; she is also advised to be vigorous in her support of the antireflux regimen & we reviewed this assoc w/ asthma exacerbations... We will plan a routine f/u 1y53yrconsider f/u CT to check the tiny nodules at that time, she will call prn any questions or problems...   ~  November 18, 2015:  45yr78yr w/ SN>  Erin Fields & reports a good yr- no new complaints or concerns;  She is stable on her regimen w/ Dulera200-2spBid regularly, Singulair10, XopenexHFA rescue inhaler prn (syas she rarely need it), Nasacort spray once daily & allergy shots from Erin Fields once per wk;  She denies any asthma attacks or resp exacerbations over the last yr, but was treated w/ Erin Fields, Erin Fields, Erin Fields rx 06/2015 by Erin Fields...    She saw Erin Fields 09/02/15> chr ven insuffic & swelling, she had right pop vein stripping yrs ago in her 20's, she has venous reflux in right leg; REC rx w/ compression hose, elev, low sodium; consider sclerotherapy later...  We reviewed the following medical problems during today's office visit >>     Allergic Rhinitis> IgE level was 129 but RAST tests all neg x ragweed; on Singulair, OTC Antihist prn, Nasacort, and she is followed by Erin Fields on allergy shots once per wk...    Asthma & AB> CXR w/ sl elev of right diaph & CT w/ tiny 3mm 77m nodules; on Dulera200-2spBid, XopenexHFA rescue inhaler prn & improved, reminded to rinse etc; she is allergic to ACE inhibitors...    Ex-smoker> former heavy smoker but quit cold [place] ago... Spirometry wnl 5/16.    GERD> prev on Prevacid30Bid & antireflux regimen, [facility], she is now on LIALDA1.2g- taking 2/d and prn Phenergan...    Medical issues include> HBP (Benicar40/d), HL (Mevacor40Qod), Hypothyroid (Synthroid100), GERD, Crohn's (Lialda-2/d [facility], HAs, Depression/ insomnia (Effexor/ Ambien)... PCP is Cody Erin AquaseB-HHartford Financialce. EXAM shows Afeb, VSS, O2sat=95% on RA;  HEENT- neg, mallampati2;  Chest- clear w/o w/r/r;  Heart- RR w/o m/r/g;  Abd- soft, nontender, neg;  Ext- w/o c/c/e;  Neuro- wnl, no focal abn...  Last CXR was 01/19/15>  Norm heart size, clear lungs, sl ant eventration  of right hemidiaph w/o change...  LABS 08/2015 in Epic>  FLP- not at goals;  Chems- ok w/ BS=105, A1c=6.4Cr=0.64, LFTs wnl;  CBC- Hg=12.9, mcv=83, Ferritin=12;  TSH= 0.20 IMP/PLAN>>  Erin Fields remains stable on her regimen including: Dulera200-2spBid, Singulair10, XopenexHFA prn, plus allergy shots per Erin Fields...   ~  November 29, 2016:  1 year ROV & pulmonary follow up visit>  Orel returns and reorts a good yr, asthma doing well on Symbicort160-2spBid (per insurance change) & XopenexHFA rescue inhaler prn & Singulair10;  She continues to f/u w/ Erin Fields for allergy- on shots weekly Nasacort, Astelin;  Her PCP is Dr. Janett Billow Fields;  Recently noted some head congestion, drainage, mild wheezing & doesn't want it to go into asthma attack=> we discussed regular dosing & add Medrol dosepak... Review of Epic notes >>     She saw DrLawson-VVS 02/08/16> Fields painful VV wearing compression hose & using Ibuprofen; he offered her foam sclerotherapy to consider...    She saw PCP-DrCopland 03/29/16 & 08/21/16>  Medical check- TSH low at 0.20, Fields dysthymia & incr depression since daugh Alinda Sierras age 29) committed suicide in the past yr, not resting, ahedonia- meds adjusted...    She saw GYN- DrKaplan- 08/03/16>  Note reviewed, c/o menopausal symptoms and  placed on Activella0.5/0.1 tab daily...    She f/u w/ Erin Fields-Allergy 08/09/16>  C/o sinusitis, Fields AR, mild persistent asthma; Rx Dymista, Astelin, Flonase, Xyzal, singulair & Rx w/ Depomedrol & Omnicef...    She saw Ortho-DrCaffrey 11/15/16 for 3rd of 3 Synvisc shots to left knee for OA... We reviewed the following medical problems during today's office visit>     Allergic Rhinitis> IgE level was 129 but RAST tests all neg x ragweed; on Singulair, OTC Antihist prn, Nasacort, and she is followed by Erin Fields on allergy shots once per wk...    Asthma & AB> CXR w/ sl elev of right diaph & CT w/ tiny 71m RUL nodules; now on Symbicort160-2spBid, XopenexHFA rescue inhaler prn & improved, reminded to rinse etc; she is allergic to ACE inhibitors...    Ex-smoker> former heavy smoker but quit cold [place] ago... Spirometry wnl 08/2014.    GERD, Crohns, liver hemangioma & NAFLD> prev on Prevacid30Bid & antireflux regimen, [facility] we do not have notes from him, she is now on LIALDA1.2g- taking 2/d and prn Phenergan...    Medical issues include> HBP (Benicar40/d), chr VI w/ vein stripping, HL (Mevacor40Qod), Hypothyroid (Synthroid88), GERD, Crohn's (Lialda-2/d [facility] [facility], DJD, HAs, Depression/ insomnia (Effexor/ Ambien)... EXAM shows Afeb, VSS, Wt=176#, O2sat=96% on RA;  HEENT- neg, mallampati2;  Chest- clear w/o w/r/r;  Heart- RR w/o m/r/g;  Abd- soft, nontender, neg;  Ext- w/o c/c/e;  Neuro- wnl, no focal abn...  LABS in Epic 08/2016> Chems- ok w/ BS=99, A1c=6.6;  TSH=0.22 & her Synthroid was decr to 852m... IMP/PLAN>>  We decided to treat w/ a medrol dosepak & continue her Asthma rx w/ Symbicort160-2spBid & XopenexHFA prn; she has been under a lot of stress, discussed & support provided- Rx per PCP; she will call for any additional problems or exacerbations...   ~  June 12, 2017:  31m2339moV & add-on appt requested for refractory URI/ AB>  KelNalee c/o 39mo4mocough, chest congestion, yellow  sput, & wheezing and has been seen several times by Erin Fields, given Erin Fields + Augmentin x2 but w/ recurrent/ lingering symptoms;  She was also Dx w/ right breast cancer in [id number] treated by DrByerly, DrFeng, DrKinard=> the  resp symptoms started after her 2nd surg, they have rec adjuvant XRT to start after recovery from her surg... She notes that her 19 y/o son recently had a URI, she states that hydrocodone caused wheezing in the past...  We reviewed the following medical problems during today's office visit>     Allergic Rhinitis> IgE level was 129 but RAST tests all neg x ragweed; on Singulair, OTC Antihist prn, Nasacort, Astelin and she is followed by Erin Fields on allergy shots once per wk...    Asthma & AB> CXR w/ sl elev of right diaph & CT w/ tiny 72m RUL nodules; on Symbicort160-2spBid, XopenexHFA rescue inhaler prn & improved, reminded to rinse etc; she is allergic to ACE inhibitors; refractory AB exac 05/2017=> see below    Ex-smoker> former heavy smoker but quit cold [place] ago... Spirometry wnl 08/2014.    GERD, Crohns, liver hemangioma & NAFLD> prev on Prevacid30Bid & antireflux regimen, [facility] do not have notes from [facility], she is now on LIALDA1.2g- taking 2/d and prn Phenergan...    Medical issues include> HBP (Benicar40/d), chr VI w/ vein stripping, HL (Crestor10), Hypothyroid (Synthroid88), GERD, Crohn's [facility], DJD, HAs, Depression/ insomnia (Effexor/ Valium/ Ambien)... EXAM shows Afeb, VSS, Wt=179#, O2sat=96% on RA;  HEENT- neg, mallampati2, parotid hypertrophy;  Chest- decr BS bilat w/ few rhonchi at bases, no wheezing/ rales/ consolidation;  S/P right breast surg;  Heart- RR w/o m/r/g;  Abd- obese, soft, nontender, neg;  Ext- w/o c/c/e;  Neuro- wnl, no focal abn...  CXR 06/12/17>  Norm heart size, essentially clear lungs, sl incr markings- NAD, sl pleural thickening noted... IMP/PLAN>>  We discussed her refractory AB & recommend- continue  Symbicort160-2spBid, rescue inhaler prn, Singulair10, etc; we gave her a Depomedrol shot & started a tapering course of oral Medrol tablets (see AVS) and we wrote for Tussionex to help her cough...   ~  December 12, 2017:  672moOV & Kaho reports overall stable from the pulmonary perspective- notes some incr SOB in the hot/ humid weather, otherw OK; she is employed doing coOptician, dispensingork and way too sedentary w/o exercise program; notes min cough, essentially no sput, no hemoptysis, and denies prob w/ SOB given her lim activities;  She finished her XRT for right breat cancer in Apr2019... We reviewed the following interval medical notes in Epic>      She saw [facility]- DrFeng on 08/06/17>  Note reviewed, they started Aromatase inhibitor & reviewed her Oncotype score (low risk dis- no benefit from adjuvant chemotherapy)...     She saw XRT- DrKinard over the interval w/ XRT given 3/12 - 08/09/17, 50.4Gy delivered in 28 fractions & right breast boost of 10Gy in 5 fractions; notes mild fatigue & some pain, using skin gel, had PT, & started an aromatase inhib...    She saw PCP- DrJCopland on 09/24/17>  BP controlled on Benicar, A1c=6.3, Chol controlled on Creastor10    She saw [facility] survivorship clinic- LCausey,NP on 11/05/17>  F/u right breast cancer- s/p surg, XRT, now on Aromasin (she tol better than Letrozole); note reviewed- she received outpt PT & graduated...     She saw GI- DrEther Griffinsn 11/19/17>  Fields adenomatous colon polyps, ulcerative pancolitis on Lialda-2tabs/d & Miralax daily for constip; no change in therapy, they plan colonoscopy Dec2020... We reviewed the following medical problems during today's office visit>     Allergic Rhinitis> IgE level was 129 but RAST tests all neg x ragweed; on Singulair, OTC  Antihist prn, Nasacort, Astelin and she is followed by Erin Fields on allergy shots once per wk...    Asthma & AB> CXR w/ sl elev of right diaph & CT w/ tiny 22m RUL nodules; on Symbicort160-2spBid, XopenexHFA  rescue inhaler prn & improved, reminded to rinse etc; she is allergic to ACE inhibitors; refractory AB exac 05/2017=> see below    Ex-smoker> former heavy smoker but quit cold [place] ago... Spirometry wnl 08/2014.    GERD, Crohns, liver hemangioma & NAFLD> prev on Prevacid30Bid & antireflux regimen, [facility] we do not have notes from him, she is now on LIALDA1.2g- taking 2/d and prn Phenergan...    Medical issues include> HBP (Benicar40/d), chr VI w/ vein stripping, HL (Crestor10), Hypothyroid (Synthroid88), GERD, [facility], DJD, HAs, Depression/ insomnia (Effexor/ Valium/ Ambien)... EXAM shows Afeb, VSS, Wt=181#, O2sat=94% on RA;  HEENT- neg, mallampati2, parotid hypertrophy;  Chest- decr BS bilat, essent clear, no wheezing/ rales/ consolidation;  S/P right breast surg;  Heart- RR w/o m/r/g;  Abd- obese, soft, nontender, neg;  Ext- w/o c/c/e;  Neuro- wnl, no focal abn... IMP/PLAN>>  KLucyndais essentially stable on her Symbicort160-2spBid & Xopenex rescue inhaler +Singulair10; asked to continue same + start on a graduated exercise program to help her lose weight & breathe better overall;  We discussed continued follow up by pulmonary asthma clinic as I retire after 2019...     Past Medical History  Diagnosis Date  . Asthma >> see above...   . Depression >> on EffexorXR150 + Valium2 and Ambien5 prn   . Frequent headaches   . GERD (gastroesophageal reflux disease) >> prev on Prevacid Bid   . Hay fever   . Crohn's Disease >> on Lialda 1.[facility]gm[facility]  . Elevated blood pressure >> on Benicar40...   . Hyperlipidemia >> on Crestor10   . Migraines   . Colon polyps   . Thyroid disease >> on Synthroid88   . Chicken pox     Past Surgical History:  Procedure Laterality Date  . ABLATION  2011  . BREAST BIOPSY Right 02/2017  . BREAST LUMPECTOMY Right 04/2017  . BREAST LUMPECTOMY WITH RADIOACTIVE SEED AND SENTINEL LYMPH NODE BIOPSY Right 04/24/2017    Procedure: RIGHT BREAST LUMPECTOMY WITH RADIOACTIVE SEED AND SENTINEL LYMPH NODE BIOPSY;  Surgeon: BStark Klein MD;  [facility]  Service: General;  Laterality: Right;  . FOOT SURGERY Right 2012  . HAND SURGERY Right   . RE-EXCISION OF BREAST LUMPECTOMY Right 05/15/2017   Procedure: RE-EXCISION OF BREAST LUMPECTOMY;  Surgeon: BStark Klein MD;  [facility]  Service: General;  Laterality: Right;  . Septoplasty with turbinate reduction    . TONSILLECTOMY  1980  . VEIN SURGERY     Removal Right Leg  . WISDOM TOOTH EXTRACTION      Outpatient Encounter Medications as of 12/12/2017  Medication Sig  . azelastine (ASTELIN) 0.1 % nasal spray 2 sprays 2 (two) times daily.  . budesonide-formoterol (SYMBICORT) 160-4.5 MCG/ACT inhaler Inhale 2 puffs into the lungs 2 (two) times daily.  . diazepam (VALIUM) 2 MG tablet Take 1 tablet (2 mg total) by mouth every 6 (six) hours as needed for anxiety. Do not combine with ambien  . EPINEPHrine 0.3 mg/0.3 mL IJ SOAJ injection epinephrine 0.3 mg/0.3 mL injection, auto-injector  TK UTD PRN  . exemestane (AROMASIN) 25 MG tablet Take 1 tablet (25 mg total) by mouth daily after breakfast.  .  hyoscyamine (ANASPAZ) 0.125 MG TBDP disintergrating tablet hyoscyamine 0.125 mg disintegrating tablet  . lansoprazole (PREVACID) 30 MG capsule Take 30 mg by mouth daily at 12 noon.  . levalbuterol (XOPENEX HFA) 45 MCG/ACT inhaler Inhale 2 puffs into the lungs every 4 (four) hours as needed for wheezing (Q4-6 Hours PRN).  Marland Kitchen levocetirizine (XYZAL) 5 MG tablet levocetirizine 5 mg tablet  TK 1 T PO QD IN THE EVE  . levothyroxine (SYNTHROID, LEVOTHROID) 88 MCG tablet Take 1 tablet (88 mcg total) by mouth daily.  . mesalamine (LIALDA) 1.2 G EC tablet Take 2.4 g by mouth daily.  . montelukast (SINGULAIR) 10 MG tablet Take 10 mg by mouth at bedtime.   Marland Kitchen olmesartan (BENICAR) 40 MG tablet TAKE 1 TABLET BY MOUTH EVERY DAY  . rosuvastatin  (CRESTOR) 10 MG tablet Take 1 tablet (10 mg total) by mouth daily.  Marland Kitchen triamcinolone (NASACORT ALLERGY 24HR) 55 MCG/ACT AERO nasal inhaler Place 1 spray into the nose daily.  Marland Kitchen venlafaxine XR (EFFEXOR-XR) 150 MG 24 hr capsule TAKE 1 BY MOUTH EVERY DAY WITH BREAKFAST  . [DISCONTINUED] acyclovir (ZOVIRAX) 400 MG tablet Take 1 tablet (400 mg total) by mouth 3 (three) times daily as needed.  . [DISCONTINUED] promethazine (PHENERGAN) 25 MG tablet TAKE 1/2-1 TABLET DAILY AS NEEDED FOR NAUSEA  . [DISCONTINUED] zolpidem (AMBIEN) 5 MG tablet TAKE 1 TABLET BY MOUTH AT BEDTIME AS NEEDED FOR SLEEP   No facility-administered encounter medications on file as of 12/12/2017.     Allergies  Allergen Reactions  . Ace Inhibitors Other (See Comments)    Angina  . Aspirin   . Cefaclor   . Lisinopril Cough  . Losartan Potassium   . Oxycodone Hives  . Sulfonamide Derivatives     Immunization History  Administered Date(s) Administered  . Influenza Inj Mdck Quad Pf 01/21/2016  . Influenza,inj,Quad PF,6+ Mos 12/20/2016  . Influenza-Unspecified 12/16/2012, 01/15/2014, 01/16/2016, 12/16/2016  . Pneumococcal Polysaccharide-23 04/17/2004, 04/14/2013  . Td 04/17/1997  . Tdap 07/22/2008    Current Medications, Allergies, Past Medical History, Past Surgical History, Family History, and Social History were reviewed in [employer] record.   Review of Systems            All symptoms NEG except where BOLDED >>  Constitutional:  F/C/S, fatigue, anorexia, unexpected weight change. HEENT:  HA, visual changes, hearing loss, earache, nasal symptoms, sore throat, mouth sores, hoarseness. Resp:  cough, sputum, hemoptysis; SOB, tightness, wheezing. Cardio:  CP, palpit, DOE, orthopnea, edema. GI:  N/V/D/C, blood in stool; reflux, abd pain, distention, gas. GU:  dysuria, freq, urgency, hematuria, flank pain, voiding difficulty. MS:  joint pain, swelling, tenderness, decr ROM; neck pain, back pain,  etc. Neuro:  HA, tremors, seizures, dizziness, syncope, weakness, numbness, gait abn. Skin:  suspicious lesions or skin rash. Heme:  adenopathy, bruising, bleeding. Psyche:  confusion, agitation, sleep disturbance, hallucinations, anxiety, depression suicidal.   Objective:   Physical Exam      Vital Signs:  Reviewed...   General:  WD, WN, 52 y/o WF in NAD; alert & oriented; pleasant & cooperative... HEENT:  [place]obb/AT; Conjunctiva- pink, Sclera- nonicteric, EOM-wnl, PERRLA, EACs-clear, TMs-wnl; NOSE-clear; THROAT-clear & wnl.  Neck:  Supple w/ full ROM; no JVD; normal carotid impulses w/o bruits; no thyromegaly or nodules palpated; no lymphadenopathy.  Chest:  Clear to P & A; without wheezes, rales, or rhonchi heard. Heart:  Regular Rhythm; norm S1 & S2 without murmurs, rubs, or gallops detected. Abdomen:  Soft & nontender-  no guarding or rebound; normal bowel sounds; no organomegaly or masses palpated. Ext:  decrROM; without deformities, + arthritic changes; no varicose veins, venous insuffic, or edema;  Pulses intact w/o bruits. Neuro:  No focal neuro findings; gait normal & balance OK. Derm:  No lesions noted; no rash etc. Lymph:  No cervical, supraclavicular, axillary, or inguinal adenopathy palpated.   Assessment:      IMP >>     Allergic Rhinitis> IgE level was 129 but RAST tests all neg x ragweed; on Singulair, OTC Antihist prn & Nasacort...    Asthma & AB> CXR w/ sl elev of right diaph & CT w/ tiny 7m RUL nodules; on AdvairHFA-110 2spBid (due to insurance) & improved, reminded to rinse etc...    Ex-smoker> former heavy smoker but quit cold [place] ago... Spirometry wnl 5/16.    GERD> [facility]id30Bid, [facility] we reviewed the antireflux regimen...    Medical issues include> HBP, HL, GERD, Crohn's, DJD, HAs, Depression... [place] [facility] [place]   PLAN >>  09/15/14>   KLimahas a signif remote smoking Fields, but her current PFTs are wnl mitigating  against COPD & in favor of her Asthma diagnosis;  She appears to be well controlled on her current regimen w/ Dulera, Singulair, Xopenex, Tussionex;  The IgE level is sl elev but the RAST panel is ok x low level Ragweed antibodies;  She appears to get freq upper resp (sinus/ bronchitis) & this exacerbates her asthma;  In addition she has a long Fields of GERD on OTC Prevacid Bid but no following an antireflux regimen- we reviewed the needed elev HOB, NPO after dinner, etc... She is asked to start back on an exercise program eg- bike or swimming (non-weight bearing due to her knee)... 11/18/15>   Erin Billingsremains stable on her regimen including: Dulera200-2spBid, Singulair10, XopenexHFA prn, plus allergy shots per Erin Fields...  11/29/16>   We decided to treat w/ a medrol dosepak & continue her Asthma rx w/ Symbicort160-2spBid & XopenexHFA prn; she has been under a lot of stress, discussed & support provided- Rx per PCP; she will call for any additional problems or exacerbations 06/12/17>   We discussed her refractory AB & recommend- continue Symbicort160-2spBid, rescue inhaler prn, Singulair10, etc; we gave her a Depomedrol shot & started a tapering course of oral Medrol tablets (see AVS) and we wrote for Tussionex to help her cough. 12/12/17>   Erin Billingsis essentially stable on her Symbicort160-2spBid & Xopenex rescue inhaler +Singulair10; asked to continue same + start on a graduated exercise program to help her lose weight & breathe better overall;  We discussed continued follow up by pulmonary asthma clinic as I retire after 2019.   Plan:     Patient's Medications  New Prescriptions   DOXYCYCLINE (VIBRA-TABS) 100 MG TABLET    Take 1 tablet (100 mg total) by mouth 2 (two) times daily.   Erin Fields (DELTASONE) 10 MG TABLET    Take 4 tabs po daily x 3 days; then 3 tabs daily x3 days; then 2 tabs daily x3 days; then 1 tab daily x 3 days; then stop  Previous Medications   AZELASTINE (ASTELIN) 0.1 % NASAL SPRAY    2  sprays 2 (two) times daily.   BUDESONIDE-FORMOTEROL (SYMBICORT) 160-4.5 MCG/ACT INHALER    Inhale 2 puffs into the lungs 2 (two) times daily.   DIAZEPAM (VALIUM) 2 MG TABLET    Take 1 tablet (2 mg total) by mouth every  6 (six) hours as needed for anxiety. Do not combine with ambien   DICLOFENAC SODIUM (PENNSAID) 2 % SOLN    Pennsaid 20 mg/gram/actuation (2 %) topical soln in metered-dose pump  APPLY TWO PUMPS TOPICALLY TO THE AFFECTED AREA TWICE DAILY   EPINEPHRINE 0.3 MG/0.3 ML IJ SOAJ INJECTION    epinephrine 0.3 mg/0.3 mL injection, auto-injector  TK UTD PRN   EXEMESTANE (AROMASIN) 25 MG TABLET    Take 1 tablet (25 mg total) by mouth daily after breakfast.   HYOSCYAMINE (ANASPAZ) 0.125 MG TBDP DISINTERGRATING TABLET    hyoscyamine 0.125 mg disintegrating tablet   LANSOPRAZOLE (PREVACID) 30 MG CAPSULE    Take 30 mg by mouth daily at 12 noon.   LEVALBUTEROL (XOPENEX HFA) 45 MCG/ACT INHALER    Inhale 2 puffs into the lungs every 4 (four) hours as needed for wheezing (Q4-6 Hours PRN).   LEVOCETIRIZINE (XYZAL) 5 MG TABLET    levocetirizine 5 mg tablet  TK 1 T PO QD IN THE EVE   LEVOTHYROXINE (SYNTHROID, LEVOTHROID) 88 MCG TABLET    Take 1 tablet (88 mcg total) by mouth daily.   MESALAMINE (LIALDA) 1.2 G EC TABLET    Take 2.4 g by mouth daily.   MONTELUKAST (SINGULAIR) 10 MG TABLET    Take 10 mg by mouth at bedtime.    OLMESARTAN (BENICAR) 40 MG TABLET    TAKE 1 TABLET BY MOUTH EVERY DAY   ROSUVASTATIN (CRESTOR) 10 MG TABLET    Take 1 tablet (10 mg total) by mouth daily.   TRIAMCINOLONE (NASACORT ALLERGY 24HR) 55 MCG/ACT AERO NASAL INHALER    Place 1 spray into the nose daily.   VENLAFAXINE XR (EFFEXOR-XR) 150 MG 24 HR CAPSULE    TAKE 1 BY MOUTH EVERY DAY WITH BREAKFAST  Modified Medications   Modified Medication Previous Medication   ACYCLOVIR (ZOVIRAX) 400 MG TABLET acyclovir (ZOVIRAX) 400 MG tablet      TAKE 1 TABLET BY MOUTH THREE TIMES A DAY AS NEEDED    Take 1 tablet (400 mg total) by mouth  3 (three) times daily as needed.   LEVOTHYROXINE (SYNTHROID, LEVOTHROID) 88 MCG TABLET levothyroxine (SYNTHROID, LEVOTHROID) 88 MCG tablet      Take 6 tablets (530 mcg total) by mouth daily before breakfast.    Take 1 tablet (88 mcg total) by mouth daily.   PROMETHAZINE (PHENERGAN) 25 MG TABLET promethazine (PHENERGAN) 25 MG tablet      TAKE 1/2-1 TABLET DAILY AS NEEDED FOR NAUSEA    TAKE 1/2-1 TABLET DAILY AS NEEDED FOR NAUSEA   ZOLPIDEM (AMBIEN) 5 MG TABLET zolpidem (AMBIEN) 5 MG tablet      TAKE 1 TABLET BY MOUTH EVERY DAY AT BEDTIME AS NEEDED FOR SLEEP    TAKE 1 TABLET BY MOUTH AT BEDTIME AS NEEDED FOR SLEEP  Discontinued Medications   No medications on file

## 2018-01-22 ENCOUNTER — Encounter: Payer: Self-pay | Admitting: Pulmonary Disease

## 2018-01-22 ENCOUNTER — Ambulatory Visit (INDEPENDENT_AMBULATORY_CARE_PROVIDER_SITE_OTHER): Payer: BLUE CROSS/BLUE SHIELD

## 2018-01-22 ENCOUNTER — Ambulatory Visit: Payer: BLUE CROSS/BLUE SHIELD | Admitting: Pulmonary Disease

## 2018-01-22 VITALS — BP 128/86 | HR 86 | Temp 98.2°F | Ht 63.5 in | Wt 181.8 lb

## 2018-01-22 DIAGNOSIS — J4530 Mild persistent asthma, uncomplicated: Secondary | ICD-10-CM | POA: Diagnosis not present

## 2018-01-22 DIAGNOSIS — J3089 Other allergic rhinitis: Secondary | ICD-10-CM

## 2018-01-22 DIAGNOSIS — Z23 Encounter for immunization: Secondary | ICD-10-CM | POA: Diagnosis not present

## 2018-01-22 DIAGNOSIS — Z87891 Personal history of nicotine dependence: Secondary | ICD-10-CM

## 2018-01-22 DIAGNOSIS — J019 Acute sinusitis, unspecified: Secondary | ICD-10-CM

## 2018-01-22 NOTE — Patient Instructions (Addendum)
Today we updated your med list in our EPIC system...    Continue your current medications the same...  We discussed a "Sinus Regimen" >>    Take the Xyzal 73m each AM    Use a NASAL SALINE every 1-2H during the day...    Use the NASACORT- 2 sprays in each nostril at bedtime...  Add-in the GUAIFENESIN 405m(Mucus Relief) tabs taking 2 tab 3 times daily w/ water/ fluids...  You already have an appt for ENT eval by DrTeoh coming up soon...  Continue the Symbicort regularly & the Xopenex rescue inhaler as needed...  Call for any questions or if we can be of service in any way...Marland KitchenMarland Kitchen

## 2018-01-23 ENCOUNTER — Encounter: Payer: Self-pay | Admitting: Pulmonary Disease

## 2018-01-23 NOTE — Progress Notes (Signed)
Subjective:     Patient ID: Erin Fields, female   DOB: 1966-01-04, 52 y.o.   MRN: [id number]  HPI ~  Sep 15, 2014:  Initial pulmonary consult by SN>        63 y/o WF, referred by Erin Aquas PA at [facility], for pulmonary evaluation>  She has a hx Asthma, AR and recurrent bronchitis & sinusitis, currently managed by Erin Fields- she was prev seen by [facility] in 2006 but all records are on the old paper charts;  Erin Fields is c/o 20mohx cough, prod of a small amt of green phlegm, and increased SOB/DOE progressing to problem w/ ADLs recently; she notes occ left CP "like stress on my heart", worse w/ a deep breath and when questioned further the SOB she is experiencing is a sensation that she can't get the air "IN", can't get a good deep breath & doesn't feel satisfied breathing; she also reports a hx of GERD & Crohn's dis- followed by Erin Fields& she takes ?OTC Prevacid Bid but not following an anti-reflux regimen...      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       Current Meds> Dulera200- 2spBid, Singulair10, XopenexHFA (using it 1-2 times daily she says); Astelin- 2spBid, Nasacort- 2spQhs, Tussionex prn, on Allergy shots for >20y64yrrev testing allergic to everything she says) EXAM shows Afeb, VSS, O2sat=96% on RA;  HEENT- neg, mallampati1;  Chest- clear w/o w/r/r;  Heart- RR  w/o m/r/g;  Abd- soft, neg;  Ext- w/o c/c/e...   Old data from Paper Chart is not available to review...  CXR 08/13/14 showed norm heart size, mild right diaph eventration, clear lungs, NAD...   LABS 5/16:  Chems- wnl x BS=122, A1c=6.4;  CBC- wnl, diff not done- older data shows norm diff (1%eos);  TSH=0.18;   She had EColi UTI 5/16 treated w/ Ceftin500Bid...   Spirometry 09/15/14 showed FVC=2.75 (87%), FEV1=2.33 (90%), %1sec=85, mid-flows were wnl at 105% predicted...   Additional LABS> IgE level= 129, RAST panel all neg x Ragweed at 0.16 (low titer)...   CT Chest >> done 09/18/14 & showed norm heart size, min atherosclerotic changes in Ao, no adenopathy; tiny 3mm 34m nodules, min emphysema, otherw clear lungs; asymmetric elev of right hemidiaph...  IMP/PLAN>>  [name] signif remote smoking hx, but her current PFTs are wnl mitigating against COPD & in favor of her Asthma diagnosis;  She appears to be well controlled on her current regimen w/ Dulera, Singulair, Xopenex, Tussionex;  The IgE level is sl elev but the RAST panel is ok x low level Ragweed antibodies;  She appears to get freq upper resp (sinus/ bronchitis) & this exacerbates her asthma;  In addition she has a long hx of GERD on OTC Prevacid  Bid but no following an antireflux regimen- we reviewed the needed elev HOB, NPO after dinner, etc... She is asked to start back on an exercise program eg- bike or swimming (non-weight bearing due to her knee).  ~  November 17, 2014:  31moROV w/ SN>  Erin Fields that she is improved- "it's all good" she says; cough & sput diminished and breathing improved...    Allergic Rhinitis> IgE level was 129 but RAST tests all neg x ragweed; on Singulair, OTC Antihist prn & Nasacort...    Asthma & AB> CXR w/ sl elev of right diaph & CT w/ tiny 325mRUL nodules; on AdvairHFA-110 2spBid (due to insurance) & improved, reminded to rinse etc...    Ex-smoker> former heavy smoker but quit cold [place] ago...  Spirometry wnl 5/16.    GERD> on Prevacid30Bid, followed by [facility] reviewed the antireflux regimen...    Medical issues include> HBP, HL, GERD, Crohn's, DJD, HAs, Depression... PCP is Erin Aquast [place] reviewed prob list, meds, xrays and labs>>  IMP/PLAN>>  [name] reassured regarding her lung health despite her remote smoking hx; breathing is improved on the Advair110HFA inhaler; she will continue this & her allergy regimen w/ Singulair, antihist, Nasacort, etc; she is also advised to be vigorous in her support of the antireflux regimen & we reviewed this assoc w/ asthma exacerbations... We will plan a routine f/u 1y2yrconsider f/u CT to check the tiny nodules at that time, she will call prn any questions or problems...   ~  November 18, 2015:  72yr53yr w/ SN>  Erin Fields & reports a good yr- no new complaints or concerns;  She is stable on her regimen w/ Dulera200-2spBid regularly, Singulair10, XopenexHFA rescue inhaler prn (syas she rarely need it), Nasacort spray once daily & allergy shots from Erin Fields once per wk;  She denies any asthma attacks or resp exacerbations over the last yr, but was treated w/ Erin Fields, Erin Fields, NEB rx 06/2015 by Erin Fields...    She saw Erin Fields 09/02/15> chr ven insuffic & swelling, she had right pop vein stripping yrs ago in her 20's, she has venous reflux in right leg; REC rx w/ compression hose, elev, low sodium; consider sclerotherapy later...  We reviewed the following medical problems during today's office visit >>     Allergic Rhinitis> IgE level was 129 but RAST tests all neg x ragweed; on Singulair, OTC Antihist prn, Nasacort, and she is followed by Erin Fields on allergy shots once per wk...    Asthma & AB> CXR w/ sl elev of right diaph & CT w/ tiny 3mm 7m nodules; on Dulera200-2spBid, XopenexHFA rescue inhaler prn & improved, reminded to rinse etc; she is allergic to ACE inhibitors...    Ex-smoker> former heavy smoker but quit cold turkeKuwaityrs ago... Spirometry wnl 5/16.    GERD> prev on Prevacid30Bid & antireflux regimen, [facility], she is now on LIALDA1.2g- taking 2/d and prn Phenergan...    Medical issues include> HBP (Benicar40/d), HL (Mevacor40Qod), Hypothyroid (Synthroid100), GERD, Crohn's (Lialda-2/d per DrMedCarrollton SpringsD, HAs, Depression/ insomnia (Effexor/ Ambien)... PCP is Cody Erin AquaseB-[facility]art[facility] Financialce. EXAM shows Afeb, VSS, O2sat=95% on RA;  HEENT- neg, mallampati2;  Chest- clear w/o w/r/r;  Heart- RR w/o m/r/g;  Abd- soft, nontender, neg;  Ext- w/o c/c/e;  Neuro- wnl, no focal abn...  Last CXR was 01/19/15>  Norm heart size, clear lungs, sl ant eventration  of right hemidiaph w/o change...  LABS 08/2015 in Epic>  FLP- not at goals;  Chems- ok w/ BS=105, A1c=6.4Cr=0.64, LFTs wnl;  CBC- Hg=12.9, mcv=83, Ferritin=12;  TSH= 0.20 IMP/PLAN>>  Mazal remains stable on her regimen including: Dulera200-2spBid, Singulair10, XopenexHFA prn, plus allergy shots per Erin Fields...   ~  November 29, 2016:  1 year ROV & pulmonary follow up visit>  Erin Fields returns and reorts a good yr, asthma doing well on Symbicort160-2spBid (per insurance change) & XopenexHFA rescue inhaler prn & Singulair10;  She continues to f/u w/ Erin Fields for allergy- on shots weekly Nasacort, Astelin;  Her PCP is Dr. Janett Billow Copland;  Recently noted some head congestion, drainage, mild wheezing & doesn't want it to go into asthma attack=> we discussed regular dosing & add Medrol dosepak... Review of Epic notes >>     She saw DrLawson-VVS 02/08/16> hx painful VV wearing compression hose & using Ibuprofen; he offered her foam sclerotherapy to consider...    She saw PCP-DrCopland 03/29/16 & 08/21/16>  Medical check- TSH low at 0.20, hx dysthymia & incr depression since daugh Alinda Sierras age 1) committed suicide in the past yr, not resting, ahedonia- meds adjusted...    She saw GYN- DrKaplan- 08/03/16>  Note reviewed, c/o menopausal symptoms and  placed on Activella0.5/0.1 tab daily...    She f/u w/ Erin Fields-Allergy 08/09/16>  C/o sinusitis, hx AR, mild persistent asthma; Rx Dymista, Astelin, Flonase, Xyzal, singulair & Rx w/ Depomedrol & Omnicef...    She saw Ortho-DrCaffrey 11/15/16 for 3rd of 3 Synvisc shots to left knee for OA... We reviewed the following medical problems during today's office visit>     Allergic Rhinitis> IgE level was 129 but RAST tests all neg x ragweed; on Singulair, OTC Antihist prn, Nasacort, and she is followed by Erin Fields on allergy shots once per wk...    Asthma & AB> CXR w/ sl elev of right diaph & CT w/ tiny 70m RUL nodules; now on Symbicort160-2spBid, XopenexHFA rescue inhaler prn & improved, reminded to rinse etc; she is allergic to ACE inhibitors...    Ex-smoker> former heavy smoker but quit cold [place] ago... Spirometry wnl 08/2014.    GERD, Crohns, liver hemangioma & NAFLD> prev on Prevacid30Bid & antireflux regimen, followed by [facility]Sh[facility] we do not have notes from him, she is now on LIALDA1.2g- taking 2/d and prn Phenergan...    Medical issues include> HBP (Benicar40/d), chr VI w/ vein stripping, HL (Mevacor40Qod), Hypothyroid (Synthroid88), GERD, [facility]hn's [facility], DJD, HAs, Depression/ insomnia (Effexor/ Ambien)... EXAM shows Afeb, VSS, Wt=176#, O2sat=96% on RA;  HEENT- neg, mallampati2;  Chest- clear w/o w/r/r;  Heart- RR w/o m/r/g;  Abd- soft, nontender, neg;  Ext- w/o c/c/e;  Neuro- wnl, no focal abn...  LABS in Epic 08/2016> Chems- ok w/ BS=99, A1c=6.6;  TSH=0.22 & her Synthroid was decr to 8669m... IMP/PLAN>>  We decided to treat w/ a medrol dosepak & continue her Asthma rx w/ Symbicort160-2spBid & XopenexHFA prn; she has been under a lot of stress, discussed & support provided- Rx per PCP; she will call for any additional problems or exacerbations...   ~  June 12, 2017:  69m30moV & add-on appt requested for refractory URI/ AB>  KelChaselynn c/o 65mo64mocough, chest congestion, yellow  sput, & wheezing and has been seen several times by Erin Fields, given Erin Fields + Augmentin x2 but w/ recurrent/ lingering symptoms;  She was also Dx w/ right breast cancer in [id number] treated by DrByerly, DrFeng, DrKinard=> the  resp symptoms started after her 2nd surg, they have rec adjuvant XRT to start after recovery from her surg... She notes that her 34 y/o son recently had a URI, she states that hydrocodone caused wheezing in the past...  We reviewed the following medical problems during today's office visit>     Allergic Rhinitis> IgE level was 129 but RAST tests all neg x ragweed; on Singulair, OTC Antihist prn, Nasacort, Astelin and she is followed by Erin Fields on allergy shots once per wk...    Asthma & AB> CXR w/ sl elev of right diaph & CT w/ tiny 38m RUL nodules; on Symbicort160-2spBid, XopenexHFA rescue inhaler prn & improved, reminded to rinse etc; she is allergic to ACE inhibitors; refractory AB exac 05/2017=> see below    Ex-smoker> former heavy smoker but quit cold [place] ago... Spirometry wnl 08/2014.    GERD, Crohns, liver hemangioma & NAFLD> prev on Prevacid30Bid & antireflux regimen, [facility] not have notes from him, she is now on LIALDA1.2g- taking 2/d and prn Phenergan...    Medical issues include> HBP (Benicar40/d), chr VI w/ vein stripping, HL (Crestor10), Hypothyroid (Synthroid88), GERD, Crohn[facility], DJD, HAs, Depression/ insomnia (Effexor/ Valium/ Ambien)... EXAM shows Afeb, VSS, Wt=179#, O2sat=96% on RA;  HEENT- neg, mallampati2, parotid hypertrophy;  Chest- decr BS bilat w/ few rhonchi at bases, no wheezing/ rales/ consolidation;  S/P right breast surg;  Heart- RR w/o m/r/g;  Abd- obese, soft, nontender, neg;  Ext- w/o c/c/e;  Neuro- wnl, no focal abn...  CXR 06/12/17>  Norm heart size, essentially clear lungs, sl incr markings- NAD, sl pleural thickening noted... IMP/PLAN>>  We discussed her refractory AB & recommend- continue  Symbicort160-2spBid, rescue inhaler prn, Singulair10, etc; we gave her a Depomedrol shot & started a tapering course of oral Medrol tablets (see AVS) and we wrote for Tussionex to help her cough...  ~  December 12, 2017:  671moOV & Zaley reports overall stable from the pulmonary perspective- notes some incr SOB in the hot/ humid weather, otherw OK; she is employed doing coOptician, dispensingork and way too sedentary w/o exercise program; notes min cough, essentially no sput, no hemoptysis, and denies prob w/ SOB given her lim activities;  She finished her XRT for right breat cancer in Apr2019... We reviewed the following interval medical notes in Epic>      She saw [facility]- DrFeng on 08/06/17>  Note reviewed, they started Aromatase inhibitor & reviewed her Oncotype score (low risk dis- no benefit from adjuvant chemotherapy)...     She saw XRT- DrKinard over the interval w/ XRT given 3/12 - 08/09/17, 50.4Gy delivered in 28 fractions & right breast boost of 10Gy in 5 fractions; notes mild fatigue & some pain, using skin gel, had PT, & started an aromatase inhib...    She saw PCP- DrJCopland on 09/24/17>  BP controlled on Benicar, A1c=6.3, Chol controlled on Creastor10    She saw [facility] survivorship clinic- LCausey,NP on 11/05/17>  F/u right breast cancer- s/p surg, XRT, now on Aromasin (she tol better than Letrozole); note reviewed- she received outpt PT & graduated...     She saw GI- DrEther Griffinsn 11/19/17>  Hx adenomatous colon polyps, ulcerative pancolitis on Lialda-2tabs/d & Miralax daily for constip; no change in therapy, they plan colonoscopy Dec2020... We reviewed the following medical problems during today's office visit>     Allergic Rhinitis> IgE level was 129 but RAST tests all neg x ragweed; on Singulair, OTC Antihist  prn, Nasacort, Astelin and she is followed by Erin Fields on allergy shots once per wk...    Asthma & AB> CXR w/ sl elev of right diaph & CT w/ tiny 90m RUL nodules; on Symbicort160-2spBid, XopenexHFA  rescue inhaler prn & improved, reminded to rinse etc; she is allergic to ACE inhibitors; refractory AB exac 05/2017=> see below    Ex-smoker> former heavy smoker but quit cold [place] ago... Spirometry wnl 08/2014.    GERD, Crohns, liver hemangioma & NAFLD> prev on Prevacid30Bid & antireflux regimen, followed by DMt [facility] we do not have notes from him, she is now on LIALDA1.2g- taking 2/d and prn Phenergan...    Medical issues include> HBP (Benicar40/d), chr VI w/ vein stripping, HL (Crestor10), Hypothyroid (Synthroid88), GERD, Crohn's (Lialda-2/d [facility], DJD, HAs, Depression/ insomnia (Effexor/ Valium/ Ambien)... EXAM shows Afeb, VSS, Wt=181#, O2sat=94% on RA;  HEENT- neg, mallampati2, parotid hypertrophy;  Chest- decr BS bilat, essent clear, no wheezing/ rales/ consolidation;  S/P right breast surg;  Heart- RR w/o m/r/g;  Abd- obese, soft, nontender, neg;  Ext- w/o c/c/e;  Neuro- wnl, no focal abn... IMP/PLAN>>  KCarolanneis essentially stable on her Symbicort160-2spBid & Xopenex rescue inhaler +Singulair10; asked to continue same + start on a graduated exercise program to help her lose weight & breathe better overall;  We discussed continued follow up by pulmonary asthma clinic as I retire after 2019...    ~  January 22, 2018:  6wk ROV & pulmonary follow up visit>         Past Medical History  Diagnosis Date  . Asthma >> see above...   . Depression >> on EffexorXR150 + Valium2 and Ambien5 prn   . Frequent headaches   . GERD (gastroesophageal reflux disease) >> prev on Prevacid Bid   . Hay fever   . Crohn's Disease >> [facility]da 1.[facility]  . Elevated blood pressure >> on Benicar40...   . Hyperlipidemia >> on Crestor10   . Migraines   . Colon polyps   . Thyroid disease >> on Synthroid88   . Chicken pox     Past Surgical History:  Procedure Laterality Date  . ABLATION  2011  . BREAST BIOPSY Right 02/2017  . BREAST LUMPECTOMY Right 04/2017  . BREAST LUMPECTOMY  WITH RADIOACTIVE SEED AND SENTINEL LYMPH NODE BIOPSY Right 04/24/2017   Procedure: RIGHT BREAST LUMPECTOMY WITH RADIOACTIVE SEED AND SENTINEL LYMPH NODE BIOPSY;  Surgeon: BStark Klein MD;  [place]  Service: General;  Laterality: Right;  . FOOT SURGERY Right 2012  . HAND SURGERY Right   . RE-EXCISION OF BREAST LUMPECTOMY Right 05/15/2017   Procedure: RE-EXCISION OF BREAST LUMPECTOMY;  Surgeon: BStark Klein MD;  [place]  Service: General;  Laterality: Right;  . Septoplasty with turbinate reduction    . TONSILLECTOMY  1980  . VEIN SURGERY     Removal Right Leg  . WISDOM TOOTH EXTRACTION      Outpatient Encounter Medications as of 01/22/2018  Medication Sig  . acyclovir (ZOVIRAX) 400 MG tablet TAKE 1 TABLET BY MOUTH THREE TIMES A DAY AS NEEDED  . azelastine (ASTELIN) 0.1 % nasal spray 2 sprays 2 (two) times daily.  . budesonide-formoterol (SYMBICORT) 160-4.5 MCG/ACT inhaler Inhale 2 puffs into the lungs 2 (two) times daily.  . diazepam (VALIUM) 2 MG tablet Take 1 tablet (2 mg total) by mouth every 6 (six) hours as needed for anxiety. Do not combine with ambien  .  Diclofenac Sodium (PENNSAID) 2 % SOLN Pennsaid 20 mg/gram/actuation (2 %) topical soln in metered-dose pump  APPLY TWO PUMPS TOPICALLY TO THE AFFECTED AREA TWICE DAILY  . EPINEPHrine 0.3 mg/0.3 mL IJ SOAJ injection epinephrine 0.3 mg/0.3 mL injection, auto-injector  TK UTD PRN  . exemestane (AROMASIN) 25 MG tablet Take 1 tablet (25 mg total) by mouth daily after breakfast.  . hyoscyamine (ANASPAZ) 0.125 MG TBDP disintergrating tablet hyoscyamine 0.125 mg disintegrating tablet  . lansoprazole (PREVACID) 30 MG capsule Take 30 mg by mouth daily at 12 noon.  . levalbuterol (XOPENEX HFA) 45 MCG/ACT inhaler Inhale 2 puffs into the lungs every 4 (four) hours as needed for wheezing (Q4-6 Hours PRN).  Marland Kitchen levocetirizine (XYZAL) 5 MG tablet levocetirizine 5 mg tablet  TK 1 T PO QD IN THE  EVE  . levothyroxine (SYNTHROID, LEVOTHROID) 88 MCG tablet Take 1 tablet (88 mcg total) by mouth daily.  Marland Kitchen levothyroxine (SYNTHROID, LEVOTHROID) 88 MCG tablet Take 6 tablets (530 mcg total) by mouth daily before breakfast.  . mesalamine (LIALDA) 1.2 G EC tablet Take 2.4 g by mouth daily.  . montelukast (SINGULAIR) 10 MG tablet Take 10 mg by mouth at bedtime.   Marland Kitchen olmesartan (BENICAR) 40 MG tablet TAKE 1 TABLET BY MOUTH EVERY DAY  . promethazine (PHENERGAN) 25 MG tablet TAKE 1/2-1 TABLET DAILY AS NEEDED FOR NAUSEA  . rosuvastatin (CRESTOR) 10 MG tablet Take 1 tablet (10 mg total) by mouth daily.  Marland Kitchen triamcinolone (NASACORT ALLERGY 24HR) 55 MCG/ACT AERO nasal inhaler Place 1 spray into the nose daily.  Marland Kitchen venlafaxine XR (EFFEXOR-XR) 150 MG 24 hr capsule TAKE 1 BY MOUTH EVERY DAY WITH BREAKFAST  . zolpidem (AMBIEN) 5 MG tablet TAKE 1 TABLET BY MOUTH EVERY DAY AT BEDTIME AS NEEDED FOR SLEEP  . [DISCONTINUED] doxycycline (VIBRA-TABS) 100 MG tablet Take 1 tablet (100 mg total) by mouth 2 (two) times daily.  . [DISCONTINUED] Erin Fields (DELTASONE) 10 MG tablet Take 4 tabs po daily x 3 days; then 3 tabs daily x3 days; then 2 tabs daily x3 days; then 1 tab daily x 3 days; then stop   No facility-administered encounter medications on file as of 01/22/2018.     Allergies  Allergen Reactions  . Ace Inhibitors Other (See Comments)    Angina  . Aspirin   . Cefaclor   . Lisinopril Cough  . Losartan Potassium   . Oxycodone Hives  . Sulfonamide Derivatives     Immunization History  Administered Date(s) Administered  . Influenza Inj Mdck Quad Pf 01/21/2016  . Influenza,inj,Quad PF,6+ Mos 12/20/2016, 01/22/2018  . Influenza-Unspecified 12/16/2012, 01/15/2014, 01/16/2016, 12/16/2016  . Pneumococcal Polysaccharide-23 04/17/2004, 04/14/2013  . Td 04/17/1997  . Tdap 07/22/2008    Current Medications, Allergies, Past Medical History, Past Surgical History, Family History, and Social History were reviewed  in [employer] record.   Review of Systems            All symptoms NEG except where BOLDED >>  Constitutional:  F/C/S, fatigue, anorexia, unexpected weight change. HEENT:  HA, visual changes, hearing loss, earache, nasal symptoms, sore throat, mouth sores, hoarseness. Resp:  cough, sputum, hemoptysis; SOB, tightness, wheezing. Cardio:  CP, palpit, DOE, orthopnea, edema. GI:  N/V/D/C, blood in stool; reflux, abd pain, distention, gas. GU:  dysuria, freq, urgency, hematuria, flank pain, voiding difficulty. MS:  joint pain, swelling, tenderness, decr ROM; neck pain, back pain, etc. Neuro:  HA, tremors, seizures, dizziness, syncope, weakness, numbness, gait abn. Skin:  suspicious  lesions or skin rash. Heme:  adenopathy, bruising, bleeding. Psyche:  confusion, agitation, sleep disturbance, hallucinations, anxiety, depression suicidal.   Objective:   Physical Exam      Vital Signs:  Reviewed...   General:  WD, WN, 52 y/o WF in NAD; alert & oriented; pleasant & cooperative... HEENT:  [place]/AT; Conjunctiva- pink, Sclera- nonicteric, EOM-wnl, PERRLA, EACs-clear, TMs-wnl; NOSE-clear; THROAT-clear & wnl.  Neck:  Supple w/ full ROM; no JVD; normal carotid impulses w/o bruits; no thyromegaly or nodules palpated; no lymphadenopathy.  Chest:  Clear to P & A; without wheezes, rales, or rhonchi heard. Heart:  Regular Rhythm; norm S1 & S2 without murmurs, rubs, or gallops detected. Abdomen:  Soft & nontender- no guarding or rebound; normal bowel sounds; no organomegaly or masses palpated. Ext:  decrROM; without deformities, + arthritic changes; no varicose veins, venous insuffic, or edema;  Pulses intact w/o bruits. Neuro:  No focal neuro findings; gait normal & balance OK. Derm:  No lesions noted; no rash etc. Lymph:  No cervical, supraclavicular, axillary, or inguinal adenopathy palpated.   Assessment:      IMP >>     Allergic Rhinitis> IgE level was 129 but RAST tests all  neg x ragweed; on Singulair, OTC Antihist prn & Nasacort...    Asthma & AB> CXR w/ sl elev of right diaph & CT w/ tiny 64m RUL nodules; on AdvairHFA-110 2spBid (due to insurance) & improved, reminded to rinse etc...    Ex-smoker> former heavy smoker but quit cold [place] ago... Spirometry wnl 5/16.    GERD> on Prevacid30Bid, [facility] we reviewed the antireflux regimen...    Medical issues include> HBP, HL, GERD, Crohn's, DJD, HAs, Depression... [place]   PLAN >>  09/15/14>   KMaryahhas a signif remote smoking hx, but her current PFTs are wnl mitigating against COPD & in favor of her Asthma diagnosis;  She appears to be well controlled on her current regimen w/ Dulera, Singulair, Xopenex, Tussionex;  The IgE level is sl elev but the RAST panel is ok x low level Ragweed antibodies;  She appears to get freq upper resp (sinus/ bronchitis) & this exacerbates her asthma;  In addition she has a long hx of GERD on OTC Prevacid Bid but no following an antireflux regimen- we reviewed the needed elev HOB, NPO after dinner, etc... She is asked to start back on an exercise program eg- bike or swimming (non-weight bearing due to her knee)... 11/18/15>   KClaiborne Billingsremains stable on her regimen including: Dulera200-2spBid, Singulair10, XopenexHFA prn, plus allergy shots per Erin Fields...  11/29/16>   We decided to treat w/ a medrol dosepak & continue her Asthma rx w/ Symbicort160-2spBid & XopenexHFA prn; she has been under a lot of stress, discussed & support provided- Rx per PCP; she will call for any additional problems or exacerbations 06/12/17>   We discussed her refractory AB & recommend- continue Symbicort160-2spBid, rescue inhaler prn, Singulair10, etc; we gave her a Depomedrol shot & started a tapering course of oral Medrol tablets (see AVS) and we wrote for Tussionex to help her cough. 12/12/17>   KClaiborne Billingsis essentially stable on her Symbicort160-2spBid & Xopenex rescue  inhaler +Singulair10; asked to continue same + start on a graduated exercise program to help her lose weight & breathe better overall;  We discussed continued follow up by pulmonary asthma clinic as I retire after 2019.   Plan:     Patient's Medications  New  Prescriptions   No medications on file  Previous Medications   ACYCLOVIR (ZOVIRAX) 400 MG TABLET    TAKE 1 TABLET BY MOUTH THREE TIMES A DAY AS NEEDED   AZELASTINE (ASTELIN) 0.1 % NASAL SPRAY    2 sprays 2 (two) times daily.   BUDESONIDE-FORMOTEROL (SYMBICORT) 160-4.5 MCG/ACT INHALER    Inhale 2 puffs into the lungs 2 (two) times daily.   DIAZEPAM (VALIUM) 2 MG TABLET    Take 1 tablet (2 mg total) by mouth every 6 (six) hours as needed for anxiety. Do not combine with ambien   DICLOFENAC SODIUM (PENNSAID) 2 % SOLN    Pennsaid 20 mg/gram/actuation (2 %) topical soln in metered-dose pump  APPLY TWO PUMPS TOPICALLY TO THE AFFECTED AREA TWICE DAILY   EPINEPHRINE 0.3 MG/0.3 ML IJ SOAJ INJECTION    epinephrine 0.3 mg/0.3 mL injection, auto-injector  TK UTD PRN   EXEMESTANE (AROMASIN) 25 MG TABLET    Take 1 tablet (25 mg total) by mouth daily after breakfast.   HYOSCYAMINE (ANASPAZ) 0.125 MG TBDP DISINTERGRATING TABLET    hyoscyamine 0.125 mg disintegrating tablet   LANSOPRAZOLE (PREVACID) 30 MG CAPSULE    Take 30 mg by mouth daily at 12 noon.   LEVALBUTEROL (XOPENEX HFA) 45 MCG/ACT INHALER    Inhale 2 puffs into the lungs every 4 (four) hours as needed for wheezing (Q4-6 Hours PRN).   LEVOCETIRIZINE (XYZAL) 5 MG TABLET    levocetirizine 5 mg tablet  TK 1 T PO QD IN THE EVE   LEVOTHYROXINE (SYNTHROID, LEVOTHROID) 88 MCG TABLET    Take 1 tablet (88 mcg total) by mouth daily.   LEVOTHYROXINE (SYNTHROID, LEVOTHROID) 88 MCG TABLET    Take 6 tablets (530 mcg total) by mouth daily before breakfast.   MESALAMINE (LIALDA) 1.2 G EC TABLET    Take 2.4 g by mouth daily.   MONTELUKAST (SINGULAIR) 10 MG TABLET    Take 10 mg by mouth at bedtime.     OLMESARTAN (BENICAR) 40 MG TABLET    TAKE 1 TABLET BY MOUTH EVERY DAY   PROMETHAZINE (PHENERGAN) 25 MG TABLET    TAKE 1/2-1 TABLET DAILY AS NEEDED FOR NAUSEA   ROSUVASTATIN (CRESTOR) 10 MG TABLET    Take 1 tablet (10 mg total) by mouth daily.   TRIAMCINOLONE (NASACORT ALLERGY 24HR) 55 MCG/ACT AERO NASAL INHALER    Place 1 spray into the nose daily.   VENLAFAXINE XR (EFFEXOR-XR) 150 MG 24 HR CAPSULE    TAKE 1 BY MOUTH EVERY DAY WITH BREAKFAST   ZOLPIDEM (AMBIEN) 5 MG TABLET    TAKE 1 TABLET BY MOUTH EVERY DAY AT BEDTIME AS NEEDED FOR SLEEP  Modified Medications   No medications on file  Discontinued Medications   DOXYCYCLINE (VIBRA-TABS) 100 MG TABLET    Take 1 tablet (100 mg total) by mouth 2 (two) times daily.   Erin Fields (DELTASONE) 10 MG TABLET    Take 4 tabs po daily x 3 days; then 3 tabs daily x3 days; then 2 tabs daily x3 days; then 1 tab daily x 3 days; then stop

## 2018-01-24 ENCOUNTER — Other Ambulatory Visit: Payer: Self-pay | Admitting: Family Medicine

## 2018-01-24 DIAGNOSIS — E034 Atrophy of thyroid (acquired): Secondary | ICD-10-CM

## 2018-01-24 NOTE — Telephone Encounter (Signed)
Rx request for levothyroxine denied, as pt. Should have 2 more refills left on rx dated 05/28/17, for total of one year supply.

## 2018-02-06 ENCOUNTER — Encounter: Payer: Self-pay | Admitting: Family Medicine

## 2018-02-06 NOTE — Progress Notes (Signed)
[place]  [phone] Fax:([phone]) [phone]  Clinic Follow Up Note   Patient Care Team: Copland, Gay Filler, MD as PCP - General (Family Medicine) [name], MD as Referring Physician (Obstetrics and Gynecology) [name], MD as Consulting Physician (Gastroenterology) [name], MD as Referring Physician (Allergy) [name], MD as Consulting Physician (General Surgery) [name], MD as Consulting Physician (Radiation Oncology) [name], MD as Consulting Physician (Hematology) Delice Bison Charlestine Massed, NP as Nurse Practitioner (Hematology and Oncology)   Date of Service:  02/07/2018  CHIEF COMPLAINTS:  Follow up Malignant neoplasm of upper-outer quadrant of right breast   Oncology History   Cancer Staging Malignant neoplasm of upper-outer quadrant of right breast in female, estrogen receptor positive ([place]) Staging form: Breast, AJCC 8th Edition - Clinical stage from 03/16/2017: Stage IA (cT1b, cN0, cM0, G2, ER: Positive, PR: Positive, HER2: Negative) - Signed by [name], MD on 03/28/2017 - Pathologic stage from 05/15/2017: Stage IA (pT2, pN0, cM0, G2, ER+, PR+, HER2-, Oncotype DX score: 3) - Signed by [name], MD on 08/06/2017       Malignant neoplasm of upper-outer quadrant of right breast in female, estrogen receptor positive ([place])   03/15/2017 Mammogram    Diagnostic Mammogram Right Breast  IMPRESSION: Palpable abnormality corresponds to distortion and focal mass associated acoustic shadowing measuring 0.8x0.9x0.9cm in the 930 o'clock location of the right breast 7 cm from the nipple.   RECOMMENDATION: Ultrasound-guided core biopsy is recommended and scheduled for the patient.    03/16/2017 Initial Biopsy    Biopsy Results  Diagnosis 03/16/17 Breast, right, needle core biopsy, 9:30 o'clock, ribbon clip placed - INVASIVE MAMMARY CARCINOMA. - SEE COMMENT.     03/16/2017 Receptors her2    Estrogen Receptor: 95%,  POSITIVE, STRONG STAINING INTENSITY Progesterone Receptor: 95%, POSITIVE, STRONG STAINING INTENSITY Proliferation Marker Ki67: 3% HER2: NEGATIVE    03/20/2017 Initial Diagnosis    Malignant neoplasm of upper-outer quadrant of right breast in female, estrogen receptor positive ([place])    04/24/2017 Surgery    Right Breast Lumpectomy with Sentinel Node Biopsy with Dr. [name]     04/24/2017 Pathology Results    Diagnosis 04/24/17 1. Breast, lumpectomy, Right w/seed - INVASIVE LOBULAR CARCINOMA, GRADE 2, SPANNING 2.2 CM. - LOBULAR NEOPLASIA (ATYPICAL LOBULAR HYPERPLASIA). - ANTERIOR SUPERIOR MARGIN IS BROADLY POSITIVE. - BIOPSY SITE. - SEE ONCOLOGY TABLE. 2. Breast, excision, Right additional Medial Margin - INVASIVE LOBULAR CARCINOMA. - LOBULAR CARCINOMA IN SITU. - INVASIVE CARCINOMA IS 0.3 CM FROM NEW MEDIAL MARGIN. 3. Lymph node, sentinel, biopsy, Right Axillary #1 - ISOLATED TUMOR CELLS IN ONE OF ONE LYMPH NODES (0/1). 4. Lymph node, sentinel, biopsy, Right Axillary #2 - ONE OF ONE LYMPH NODES NEGATIVE FOR CARCINOMA (0/1). 5. Lymph node, sentinel, biopsy, Right Axillary #3 - ONE OF ONE LYMPH NODES NEGATIVE FOR CARCINOMA (0/1).    04/24/2017 Oncotype testing    Her Oncotyple recurrence score was 3, her distant recurrence score with Tamoxifen was 3% and her adjuvant chemotherapy benefit was <1%.     05/15/2017 Surgery    Re-excision of Right Breast Lumpectomy with Dr. [name]     05/15/2017 Pathology Results    Diagnosis 05/15/17 Breast, excision, Right additional anterior margin - INVASIVE LOBULAR CARCINOMA. - ATYPICAL LOBULAR HYPERPLASIA. - THE NEW ANTERIOR SURGICAL RESECTION MARGIN IS NEGATIVE FOR CARCINOMA.    05/15/2017 Cancer Staging    Staging form: Breast, AJCC 8th Edition - Pathologic stage from 05/15/2017: Stage IA (pT2, pN0, cM0, G2, ER+, PR+,  HER2-, Oncotype DX score: 3) - Signed by [name], MD on 08/06/2017    06/26/2017 -  Radiation Therapy    Adjuvant RT with Dr. [name]      07/12/2017 Genetic Testing    Negative genetic testing on the multi-cancer panel.  The Multi-Gene Panel offered by [name] includes sequencing and/or deletion duplication testing of the following 83 genes: ALK, APC, ATM, AXIN2,BAP1,  BARD1, BLM, BMPR1A, BRCA1, BRCA2, BRIP1, CASR, CDC73, CDH1, CDK4, CDKN1B, CDKN1C, CDKN2A (p14ARF), CDKN2A (p16INK4a), CEBPA, CHEK2, CTNNA1, DICER1, DIS3L2, EGFR (c.2369C>T, p.Thr790Met variant only), EPCAM (Deletion/duplication testing only), FH, FLCN, GATA2, GPC3, GREM1 (Promoter region deletion/duplication testing only), HOXB13 (c.251G>A, p.Gly84Glu), HRAS, KIT, MAX, MEN1, MET, MITF (c.952G>A, p.Glu318Lys variant only), MLH1, MSH2, MSH3, MSH6, MUTYH, NBN, NF1, NF2, NTHL1, PALB2, PDGFRA, PHOX2B, PMS2, POLD1, POLE, POT1, PRKAR1A, PTCH1, PTEN, RAD50, RAD51C, RAD51D, RB1, RECQL4, RET, RUNX1, SDHAF2, SDHA (sequence changes only), SDHB, SDHC, SDHD, SMAD4, SMARCA4, SMARCB1, SMARCE1, STK11, SUFU, TERT, TERT, TMEM127, TP53, TSC1, TSC2, VHL, WRN and WT1.  The report date is July 12, 2017.     08/13/2017 Imaging    08/13/2017 DEXA ASSESSMENT: The BMD measured at AP Spine L1-L4 is 1.120 g/cm2 with a T-score of -0.5. This patient is considered normal according to [facility]) criteria.     08/2017 -  Anti-estrogen oral therapy    Letrozole daily, changed to Exemestane due to poor tolerance of Letrozole    01/16/2018 Imaging    01/16/2018 CT Chest IMPRESSION: Stable 2 mm RIGHT upper lobe nodule.  Post radiation therapy changes at the anterolateral RIGHT upper lobe.  Post RIGHT breast surgery and RIGHT axillary node dissection.  No new abnormalities.  Aortic Atherosclerosis (ICD10-I70.0).      HISTORY OF PRESENTING ILLNESS: 03/28/17 Erin Fields 52 y.o. female is a here because of newly diagnosed right breast cancer.The patient presents to the clinic today accompanied by her sister.  Her mass palpated by patient initially in late October. She  felt slight pain. She initially thought it was from a cyst which she had several times before. The pain went away but the lump remained. She went to her GYN who did further work up and found her mass to be cancer. She feels tenderness and has bruising from biopsy.   Today she reports she lost her husband in a car accident in 2018 and lost her daughter to suicide from overdose in 2017. She takes Valium daily to help with her anxiety. This is managed by Dr. Lorelei Pont. She declined seeing a Education officer, museum from our clinic. She feels better after speaking with the doctors today. She notes to having occasional ankle swelling.   In the past the patient was diagnosed with multiple comorbidities. She plans to have right knee surgery for her arthritis in the future. She takes aleve for pain. She had multiple surgeries on her hands and feet, ablations, a vein removed and nasal pathway surgery. Her mother had diffuse metastatic cancer at 82yo, and she does not know the primary tumor. Patient quit smoking in 1995. She had ablation in 2011 due to menorrhagia that she was having every 2 weeks. She had significant hot flashes and started hormonal replacement in 07/2016 that she stopped 2 weeks ago. She did not increase her Effexor as she says this would increase her blood pressure.   GYN HISTORY  Menarchal: 9 LMP: no period since endometrial ablation in 2011 Contraceptive: yes, age [age] HRT: Lopreeza, 8 months from 07/2016-02/2017 G3P2A1: 1 miscarriage, has 1  son and 1 deceased daughter.   CURRENT THERAPY: adjuvant exemestane.  INTERVAL HISTORY: Erin Fields is here for follow up after started anti-hormonal therapy. She started Letrozole in 09/2017, but experienced bilateral hip pain that required switching to exemestane, which she was noted to be tolerating well. She has also been following up with pulmonary in the interim. Today, she is here alone at the clinic. She states that she noticed enlarged parotid glands,  that are causing earaches now. She's been having soar throat for a month now and recurrent sinus infections. She also had a dental procedure recently. She noticed bilateral ankle bruising, but denies bleeding from nose or mouth.   MEDICAL HISTORY:  Past Medical History:  Diagnosis Date  . Allergy   . Anxiety   . Asthma   . Cancer [facility])    right breast cancer  . Chicken pox   . Colon polyps   . Depression   . Elevated blood pressure   . Environmental allergies   . Family history of cancer   . Frequent headaches   . GERD (gastroesophageal reflux disease)   . Hay fever   . Hyperlipidemia   . Hypertension   . Hypothyroidism   . Migraines   . Personal history of radiation therapy   . PONV (postoperative nausea and vomiting)   . Thyroid disease   . Ulcerative colitis ([place])   . Varicose veins     SURGICAL HISTORY: Past Surgical History:  Procedure Laterality Date  . ABLATION  2011  . BREAST BIOPSY Right 02/2017  . BREAST LUMPECTOMY Right 04/2017  . BREAST LUMPECTOMY WITH RADIOACTIVE SEED AND SENTINEL LYMPH NODE BIOPSY Right 04/24/2017   Procedure: RIGHT BREAST LUMPECTOMY WITH RADIOACTIVE SEED AND SENTINEL LYMPH NODE BIOPSY;  Surgeon: [name], MD;  Location: [place];  Service: General;  Laterality: Right;  . FOOT SURGERY Right 2012  . HAND SURGERY Right   . RE-EXCISION OF BREAST LUMPECTOMY Right 05/15/2017   Procedure: RE-EXCISION OF BREAST LUMPECTOMY;  Surgeon: [name], MD;  Location: [place];  Service: General;  Laterality: Right;  . Septoplasty with turbinate reduction    . TONSILLECTOMY  1980  . VEIN SURGERY     Removal Right Leg  . WISDOM TOOTH EXTRACTION      SOCIAL HISTORY: Social History   Socioeconomic History  . Marital status: Married    Spouse name: Not on file  . Number of children: Not on file  . Years of education: Not on file  . Highest education level: Not on file  Occupational History  . Not on file    Social Needs  . Financial resource strain: Not on file  . Food insecurity:    Worry: Not on file    Inability: Not on file  . Transportation needs:    Medical: Not on file    Non-medical: Not on file  Tobacco Use  . Smoking status: Former Smoker    Packs/day: 4.00    Years: 15.00    Pack years: 60.00    Types: Cigarettes    Last attempt to quit: 09/14/1993    Years since quitting: 24.4  . Smokeless tobacco: Never Used  . Tobacco comment: Quit >20 yrs ago  Substance and Sexual Activity  . Alcohol use: Yes    Comment: Drinks a couple every 2-3 months  . Drug use: No  . Sexual activity: Never    Comment: ablation  Lifestyle  . Physical activity:  Days per week: Not on file    Minutes per session: Not on file  . Stress: Not on file  Relationships  . Social connections:    Talks on phone: Not on file    Gets together: Not on file    Attends religious service: Not on file    Active member of club or organization: Not on file    Attends meetings of clubs or organizations: Not on file    Relationship status: Not on file  . Intimate partner violence:    Fear of current or ex partner: Not on file    Emotionally abused: Not on file    Physically abused: Not on file    Forced sexual activity: Not on file  Other Topics Concern  . Not on file  Social History Narrative  . Not on file    FAMILY HISTORY: Family History  Problem Relation Age of Onset  . Arthritis Mother 64       Deceased  . Breast cancer Mother   . Lung cancer Mother   . Hyperlipidemia Mother   . Heart disease Mother   . Hypertension Mother   . Diabetes Mother   . Crohn's disease Mother   . Diabetes Maternal Grandfather   . Heart disease Maternal Grandfather   . Heart attack Maternal Grandfather   . Leukemia Maternal Grandfather   . Colitis Sister   . Leukemia Cousin 3       mat cousin    ALLERGIES:  is allergic to ace inhibitors; aspirin; cefaclor; lisinopril; losartan potassium; oxycodone;  sulfa antibiotics; and sulfonamide derivatives.  MEDICATIONS:  Current Outpatient Medications  Medication Sig Dispense Refill  . acyclovir (ZOVIRAX) 400 MG tablet TAKE 1 TABLET BY MOUTH THREE TIMES A DAY AS NEEDED 90 tablet 1  . azelastine (ASTELIN) 0.1 % nasal spray 2 sprays 2 (two) times daily.  6  . budesonide-formoterol (SYMBICORT) 160-4.5 MCG/ACT inhaler Inhale 2 puffs into the lungs 2 (two) times daily.    . diazepam (VALIUM) 2 MG tablet Take 1 tablet (2 mg total) by mouth every 6 (six) hours as needed for anxiety. Do not combine with ambien 30 tablet 1  . Diclofenac Sodium (PENNSAID) 2 % SOLN Pennsaid 20 mg/gram/actuation (2 %) topical soln in metered-dose pump  APPLY TWO PUMPS TOPICALLY TO THE AFFECTED AREA TWICE DAILY    . EPINEPHrine 0.3 mg/0.3 mL IJ SOAJ injection epinephrine 0.3 mg/0.3 mL injection, auto-injector  TK UTD PRN    . exemestane (AROMASIN) 25 MG tablet Take 1 tablet (25 mg total) by mouth daily after breakfast. 90 tablet 1  . hyoscyamine (ANASPAZ) 0.125 MG TBDP disintergrating tablet hyoscyamine 0.125 mg disintegrating tablet    . lansoprazole (PREVACID) 30 MG capsule Take 30 mg by mouth daily at 12 noon.    . levalbuterol (XOPENEX HFA) 45 MCG/ACT inhaler Inhale 2 puffs into the lungs every 4 (four) hours as needed for wheezing (Q4-6 Hours PRN).    Marland Kitchen levocetirizine (XYZAL) 5 MG tablet levocetirizine 5 mg tablet  TK 1 T PO QD IN THE EVE    . levothyroxine (SYNTHROID, LEVOTHROID) 88 MCG tablet Take 1 tablet (88 mcg total) by mouth daily. 90 tablet 3  . levothyroxine (SYNTHROID, LEVOTHROID) 88 MCG tablet Take 6 tablets (530 mcg total) by mouth daily before breakfast. 90 tablet 1  . mesalamine (LIALDA) 1.2 G EC tablet Take 2.4 g by mouth daily.    . montelukast (SINGULAIR) 10 MG tablet Take 10 mg by mouth at  bedtime.     Marland Kitchen olmesartan (BENICAR) 40 MG tablet TAKE 1 TABLET BY MOUTH EVERY DAY 90 tablet 1  . promethazine (PHENERGAN) 25 MG tablet TAKE 1/2-1 TABLET DAILY AS  NEEDED FOR NAUSEA 30 tablet 0  . rosuvastatin (CRESTOR) 10 MG tablet Take 1 tablet (10 mg total) by mouth daily. 90 tablet 3  . triamcinolone (NASACORT ALLERGY 24HR) 55 MCG/ACT AERO nasal inhaler Place 1 spray into the nose daily.    Marland Kitchen venlafaxine XR (EFFEXOR-XR) 150 MG 24 hr capsule TAKE 1 BY MOUTH EVERY DAY WITH BREAKFAST 90 capsule 3  . zolpidem (AMBIEN) 5 MG tablet TAKE 1 TABLET BY MOUTH EVERY DAY AT BEDTIME AS NEEDED FOR SLEEP 30 tablet 1  . amoxicillin-clavulanate (AUGMENTIN) 875-125 MG tablet Take 1 tablet by mouth 2 (two) times daily. 20 tablet 0   No current facility-administered medications for this visit.     REVIEW OF SYSTEMS:   Constitutional: Denies fevers, chills or abnormal night sweats Eyes: Denies blurriness of vision, double vision or watery eyes Ears, nose, mouth, throat, and face: Denies mucositis (+) soar throat (+) enlarged parotid glands (+) recent sinusitis (+) recent dental procedure Respiratory: Denies cough, dyspnea or wheezes  Cardiovascular: Denies palpitation, chest discomfort  Gastrointestinal:  Denies nausea, heartburn or change in bowel habits Skin: Denies abnormal skin rashes Lymphatics: Denies new lymphadenopathy or easy bruising Neurological:Denies numbness, tingling or new weaknesses MSK: (+) arthritis, especially in right knee Behavioral/Psych: Mood is stable, no new changes  All other systems were reviewed with the patient and are negative. Breast: (+) skin erythema secondary to radiation   PHYSICAL EXAMINATION: ECOG PERFORMANCE STATUS: 0 - Asymptomatic  Vitals:   02/07/18 1433  BP: (!) 146/84  Pulse: (!) 115  Resp: 17  Temp: 98.3 F (36.8 C)  SpO2: 95%   Filed Weights   02/07/18 1433  Weight: 189 lb 1.6 oz (85.8 kg)    GENERAL:alert, no distress and comfortable SKIN: skin color, texture, turgor are normal, no rashes or significant lesions EYES: normal, conjunctiva are pink and non-injected, sclera clear OROPHARYNX:no exudate, no  erythema and lips, buccal mucosa, and tongue normal (+) bilateral tender and enlarges parotid glands, left parotid larger than right  NECK: supple, thyroid normal size, non-tender, without nodularity LYMPH:  no palpable lymphadenopathy in the cervical, axillary or inguinal LUNGS: clear to auscultation and percussion with normal breathing effort HEART: regular rate & rhythm and no murmurs and no lower extremity edema ABDOMEN:abdomen soft, non-tender and normal bowel sounds Musculoskeletal:no cyanosis of digits and no clubbing  PSYCH: alert & oriented x 3 with fluent speech NEURO: no focal motor/sensory deficits Breast: (+) s/p right breast lumpectomy. She has diffuse skin erythema of the right breast. No skin peeling or ulcers. There is a mass in the 10 o'clock position in the right breast corresponding to the mammographically know seroma  LABORATORY DATA:  I have reviewed the data as listed CBC Latest Ref Rng & Units 02/07/2018 11/05/2017 05/15/2017  WBC 4.0 - 10.5 K/uL 8.5 8.1 -  Hemoglobin 12.0 - 15.0 g/dL 13.0 13.3 14.3  Hematocrit 36.0 - 46.0 % 40.4 40.5 -  Platelets 150 - 400 K/uL 294 333 -    CMP Latest Ref Rng & Units 02/07/2018 03/28/2017 12/20/2016  Glucose 70 - 99 mg/dL 244(H) 160(H) 114(H)  BUN 6 - 20 mg/dL 7 7.0 8  Creatinine 0.44 - 1.00 mg/dL 1.16(H) 0.8 0.73  Sodium 135 - 145 mmol/L 142 141 142  Potassium 3.5 - 5.1 mmol/L  4.4 3.7 4.2  Chloride 98 - 111 mmol/L 105 - 106  CO2 22 - 32 mmol/L _0 Calcium 8.9 - 10.3 mg/dL 9.1 9.5 9.4  Total Protein 6.5 - 8.1 g/dL 6.6 6.9 -  Total Bilirubin 0.3 - 1.2 mg/dL 0.5 0.65 -  Alkaline Phos 38 - 126 U/L 78 64 -  AST 15 - 41 U/L 18 25 -  ALT 0 - 44 U/L 28 24 -        PATHOLOGY  07/05/2017 Molecular Pathology Complete Results The following genes were evaluated for sequence changes and exonic deletions/duplications: ALK, APC, ATM, AXIN2, BAP1, BARD1, BLM, BMPR1A, BRCA1, BRCA2, BRIP1, CASR, CDC73, CDH1, CDK4, CDKN1B,  CDKN1C, CDKN2A (p14ARF), CDKN2A (p16INK4a), CEBPA, CHEK2, CTNNA1, DICER1, DIS3L2, EPCAM*, FH, FLCN, GATA2, GPC3, GREM1*, HRAS, KIT, MAX, MEN1, MET, MLH1, MSH2, MSH3, MSH6, MUTYH, NBN, NF1, NF2, PALB2, PDGFRA, PHOX2B*, PMS2, POLD1, POLE, POT1, PRKAR1A, PTCH1, PTEN, RAD50, RAD51C, RAD51D, RB1, RECQL4, RET, RUNX1, SDHAF2, SDHB, SDHC, SDHD, SMAD4, SMARCA4, SMARCB1, SMARCE1, STK11, SUFU, TERC, TERT, TMEM127, TP53, TSC1, TSC2, VHL, WRN*, WT1 The following genes were evaluated for sequence changes only: EGFR*, HOXB13*, MITF*, NTHL1*, SDHA Results are negative unless otherwise indicated   Diagnosis 05/15/17 Breast, excision, Right additional anterior margin - INVASIVE LOBULAR CARCINOMA. - ATYPICAL LOBULAR HYPERPLASIA. - THE NEW ANTERIOR SURGICAL RESECTION MARGIN IS NEGATIVE FOR CARCINOMA.  Diagnosis 04/24/17 1. Breast, lumpectomy, Right w/seed - INVASIVE LOBULAR CARCINOMA, GRADE 2, SPANNING 2.2 CM. - LOBULAR NEOPLASIA (ATYPICAL LOBULAR HYPERPLASIA). - ANTERIOR SUPERIOR MARGIN IS BROADLY POSITIVE. - BIOPSY SITE. - SEE ONCOLOGY TABLE. 2. Breast, excision, Right additional Medial Margin - INVASIVE LOBULAR CARCINOMA. - LOBULAR CARCINOMA IN SITU. - INVASIVE CARCINOMA IS 0.3 CM FROM NEW MEDIAL MARGIN. 3. Lymph node, sentinel, biopsy, Right Axillary #1 - ISOLATED TUMOR CELLS IN ONE OF ONE LYMPH NODES (0/1). 4. Lymph node, sentinel, biopsy, Right Axillary #2 - ONE OF ONE LYMPH NODES NEGATIVE FOR CARCINOMA (0/1). 5. Lymph node, sentinel, biopsy, Right Axillary #3 - ONE OF ONE LYMPH NODES NEGATIVE FOR CARCINOMA (0/1). Microscopic Comment 1. BREAST, INVASIVE TUMOR Procedure: Right breast lumpectomy with additional margin and right axillary sentinel lymph nodes. Laterality: Right. Tumor Size: Histologic Type: Grade: 2 Tubular Differentiation: 3 Nuclear Pleomorphism: 2 Mitotic Count: 1 Ductal Carcinoma in Situ (DCIS): Not identified. Extent of Tumor: Confined to breast  parenchyma. Margins: Invasive carcinoma, distance from closest margin: Present at the anterior superior margin broadly. DCIS, distance from closest margin: N/A. Regional Lymph Nodes: Number of Lymph Nodes Examined: 3 Number of Sentinel Lymph Nodes Examined: 3 Lymph Nodes with Macrometastases: 0 Lymph Nodes with Micrometastases: 0 Lymph Nodes with Isolated Tumor Cells: 1 Breast Prognostic Profile: Performed on biopsy [phone], see below. Will not be repeated. Estrogen Receptor: Positive, 95% strong staining. Progesterone Receptor: Positive, 95% strong staining. Her2: Negative (ratio 1.22). Ki-67: 3% Best tumor block for sendout testing: 1D Pathologic Stage Classification (pTNM, AJCC 8th Edition): Primary Tumor (pT): pT2 Regional Lymph Nodes (pN): pN0 (i+) Distant Metastases (pM): pMX Comments: Immunohistochemistry for pancytokeratin reveals tumor at the anterior superior margin and isolated tumor cells within one lymph node.  Biopsy Results  Diagnosis 03/16/17 Breast, right, needle core biopsy, 9:30 o'clock, ribbon clip placed - INVASIVE MAMMARY CARCINOMA. - SEE COMMENT. Microscopic Comment The carcinoma appears grade II. An E-cadherin stain and a breast prognostic profile will be performed and the results will be reported separately. The results were called to [facility] on 03/20/19/18. FLUORESCENCE IN-SITU HYBRIDIZATION Results: HER2 -  NEGATIVE RATIO OF HER2/CEP17 SIGNALS 1.22 AVERAGE HER2 COPY NUMBER PER CELL 1.65 PROGNOSTIC INDICATORS Results: IMMUNOHISTOCHEMICAL AND MORPHOMETRIC ANALYSIS PERFORMED MANUALLY Estrogen Receptor: 95%, POSITIVE, STRONG STAINING INTENSITY Progesterone Receptor: 95%, POSITIVE, STRONG STAINING INTENSITY Proliferation Marker Ki67: 3%  The malignant cells are negative for E-cadherin, supporting a lobular phenotype.  RADIOGRAPHIC STUDIES: I have personally reviewed the radiological images as listed and agreed with the  findings in the report.  01/16/2018 CT Chest IMPRESSION: Stable 2 mm RIGHT upper lobe nodule.  Post radiation therapy changes at the anterolateral RIGHT upper lobe.  Post RIGHT breast surgery and RIGHT axillary node dissection.  No new abnormalities.  Aortic Atherosclerosis (ICD10-I70.0).   08/13/2017 DEXA ASSESSMENT: The BMD measured at AP Spine L1-L4 is 1.120 g/cm2 with a T-score of -0.5. This patient is considered normal according to [facility]) criteria.  Diagnostic Mammogram 08/06/17 IMPRESSION: 1. No evidence of malignancy in the left breast. 2. Simple appearing postoperative seroma in the 10 o'clock position of the right breast measures 4.0 cm greatest diameter. 3. Resolving hematoma and/or fat necrosis at the site of palpable thickening 11 o'clock position periareolar where the patient states she had previous skin bruising.  Diagnostic Mammogram and [place] 07/18/17 IMPRESSION: Expected postsurgical seroma within the RIGHT breast at the 10 o'clock axis, measuring 4 cm, corresponding to the area of clinical concern. Patient states that the overlying skin redness has improved after starting antibiotics earlier in the week.  Ct Chest Wo Contrast  Result Date: 01/16/2018 CLINICAL DATA:  Lung nodule less than 1 cm diameter, breast cancer post radiation therapy in April 2019 EXAM: CT CHEST WITHOUT CONTRAST TECHNIQUE: Multidetector CT imaging of the chest was performed following the standard protocol without IV contrast. Sagittal and coronal MPR images reconstructed from axial data set. COMPARISON:  09/18/2014 FINDINGS: Cardiovascular: Atherosclerotic calcification aorta and LEFT main coronary artery. Aorta normal caliber. Minimal pericardial thickening or fluid. Heart otherwise unremarkable. Mediastinum/Nodes: Esophagus normal appearance. Base of cervical region normal appearance. Surgical clips in RIGHT axilla from likely prior node dissection. Asymmetric  density at the lateral RIGHT breast with surgical clips likely site of prior RIGHT breast cancer surgery. No definite thoracic adenopathy. Lungs/Pleura: Minimal subpleural scarring in the anterolateral LEFT upper lobe likely due to radiation therapy port. Tiny subpleural nodule RIGHT upper lobe 2 mm diameter image 36 unchanged. Remaining lungs clear. No infiltrate, pleural effusion, pneumothorax or new mass. Upper Abdomen: Visualized upper abdomen unremarkable. Musculoskeletal: No acute osseous findings. IMPRESSION: Stable 2 mm RIGHT upper lobe nodule. Post radiation therapy changes at the anterolateral RIGHT upper lobe. Post RIGHT breast surgery and RIGHT axillary node dissection. No new abnormalities. Aortic Atherosclerosis (ICD10-I70.0). Electronically Signed   By: Lavonia Dana M.D.   On: 01/16/2018 13:33    ASSESSMENT & PLAN:  EILIYAH REH is a 52 y.o. caucasian female with a history of HLD, GERD, Depression and anxiety, asthma, allergies, HTN, hypothyroidism, liver hemangioma, h/o cervical cancer in 2008, and Ulcerative colitis.   1. Malignant neoplasm of upper-outer quadrant of right breast, invasive lobular carcinoma, stage IA, pT2N0M0, ER/PR: Positive, HER2 negative, Grade II --I discussed her surgical path result in details -the Oncotype Dx result was reviewed with her in details. Her Oncotyple recurrence score was 3, her distant recurrence score with Tamoxifen was 3% and there is no benefit of adjuvant chemotherapy.  The very low risk disease. -She began adjuvant RT with Dr. [name] on 06/26/17 and plans to complete on 08/09/17 --She has started adjuvant antiestrogen therapy.  She was previously started on Letrozole, but didn't tolerate and was switched to exemestane in 09/2017, tolerating well, will continue for 5-7 years -Pt had a mammogram done on 07/18/17 and 08/06/17 to evaluate a seroma. Results showed no evidence of malignancy. Next mammogram will be due in April 2020.  -We also discussed the  breast cancer surveillance after her surgery. She will continue annual screening mammogram, self exam, and a routine office visit with lab and exam with [place]. -I encouraged her to have healthy diet and exercise regularly -Labs reviewed, CBC is WNLs and CMP is pending. -F/u in 4 months   2. Genetic Testing -She is not a strong candidate for genetics given her unknown family history of cancer. She is insistent on doing genetic testing for herself and her son.  -I previously sent genetic referral  -Genetic testing was negative  3. Anxiety  -Given her recent loss of husband and daughter I offered social work referral. She declined at this time.  -She is currently on Valium daily managed by Dr. Lorelei Pont  4. Bone Health  -I discussed how long term use of aromatase inhibitor can potentially weaken the bones -Her recent bone density scan from August 13, 2017 was normal. I discussed with pt    5. Bilateral tender and enlarged parotid glands -She had a soar throat, sinusitis, and a dental procedure recently -I do not suspect malignancy. Most likely cause is infectious/inflammatory, she is not febrile -I advised her to f/u with PCP   PLAN:  -continue exemestane  -f/u in 4 months with labs, will order mammogram next visit   No orders of the defined types were placed in this encounter.   All questions were answered. The patient knows to call the clinic with any problems, questions or concerns. I spent 20 minutes counseling the patient face to face. The total time spent in the appointment was 25 minutes and more than 50% was on counseling.  Dierdre Searles Dweik am acting as scribe for Dr. [name].  I have reviewed the above documentation for accuracy and completeness, and I agree with the above.    [name], MD 02/07/2018

## 2018-02-07 ENCOUNTER — Inpatient Hospital Stay: Payer: BLUE CROSS/BLUE SHIELD

## 2018-02-07 ENCOUNTER — Ambulatory Visit: Payer: BLUE CROSS/BLUE SHIELD | Admitting: Family Medicine

## 2018-02-07 ENCOUNTER — Encounter: Payer: Self-pay | Admitting: Family Medicine

## 2018-02-07 ENCOUNTER — Encounter: Payer: Self-pay | Admitting: Hematology

## 2018-02-07 ENCOUNTER — Inpatient Hospital Stay: Payer: BLUE CROSS/BLUE SHIELD | Attending: Hematology | Admitting: Hematology

## 2018-02-07 VITALS — BP 124/80 | HR 90 | Temp 97.9°F | Resp 16 | Ht 63.5 in | Wt 188.0 lb

## 2018-02-07 VITALS — BP 146/84 | HR 115 | Temp 98.3°F | Resp 17 | Ht 63.5 in | Wt 189.1 lb

## 2018-02-07 DIAGNOSIS — C50411 Malignant neoplasm of upper-outer quadrant of right female breast: Secondary | ICD-10-CM | POA: Diagnosis present

## 2018-02-07 DIAGNOSIS — Z806 Family history of leukemia: Secondary | ICD-10-CM | POA: Diagnosis not present

## 2018-02-07 DIAGNOSIS — K111 Hypertrophy of salivary gland: Secondary | ICD-10-CM | POA: Insufficient documentation

## 2018-02-07 DIAGNOSIS — Z170 Estrogen receptor positive status [ER+]: Secondary | ICD-10-CM

## 2018-02-07 DIAGNOSIS — F419 Anxiety disorder, unspecified: Secondary | ICD-10-CM | POA: Diagnosis not present

## 2018-02-07 DIAGNOSIS — K118 Other diseases of salivary glands: Secondary | ICD-10-CM | POA: Diagnosis not present

## 2018-02-07 DIAGNOSIS — Z803 Family history of malignant neoplasm of breast: Secondary | ICD-10-CM | POA: Diagnosis not present

## 2018-02-07 DIAGNOSIS — Z79811 Long term (current) use of aromatase inhibitors: Secondary | ICD-10-CM | POA: Diagnosis not present

## 2018-02-07 DIAGNOSIS — Z87891 Personal history of nicotine dependence: Secondary | ICD-10-CM | POA: Insufficient documentation

## 2018-02-07 DIAGNOSIS — Z801 Family history of malignant neoplasm of trachea, bronchus and lung: Secondary | ICD-10-CM | POA: Diagnosis not present

## 2018-02-07 DIAGNOSIS — I10 Essential (primary) hypertension: Secondary | ICD-10-CM | POA: Diagnosis not present

## 2018-02-07 LAB — CMP (CANCER CENTER ONLY)
ALT: 28 U/L (ref 0–44)
AST: 18 U/L (ref 15–41)
Albumin: 3.8 g/dL (ref 3.5–5.0)
Alkaline Phosphatase: 78 U/L (ref 38–126)
Anion gap: 12 (ref 5–15)
BILIRUBIN TOTAL: 0.5 mg/dL (ref 0.3–1.2)
BUN: 7 mg/dL (ref 6–20)
CHLORIDE: 105 mmol/L (ref 98–111)
CO2: 25 mmol/L (ref 22–32)
CREATININE: 1.16 mg/dL — AB (ref 0.44–1.00)
Calcium: 9.1 mg/dL (ref 8.9–10.3)
GFR, EST NON AFRICAN AMERICAN: 52 mL/min — AB (ref >60)
GFR, Est AFR Am: >60 mL/min (ref >60)
GLUCOSE: 244 mg/dL — AB (ref 70–99)
Potassium: 4.4 mmol/L (ref 3.5–5.1)
Sodium: 142 mmol/L (ref 135–145)
TOTAL PROTEIN: 6.6 g/dL (ref 6.5–8.1)

## 2018-02-07 LAB — CBC WITH DIFFERENTIAL (CANCER CENTER ONLY)
ABS IMMATURE GRANULOCYTES: 0.13 10*3/uL — AB (ref 0.00–0.07)
BASOS ABS: 0 10*3/uL (ref 0.0–0.1)
BASOS PCT: 1 %
Eosinophils Absolute: 0.2 10*3/uL (ref 0.0–0.5)
Eosinophils Relative: 2 %
HCT: 40.4 % (ref 36.0–46.0)
HEMOGLOBIN: 13 g/dL (ref 12.0–15.0)
Immature Granulocytes: 2 %
LYMPHS PCT: 21 %
Lymphs Abs: 1.8 10*3/uL (ref 0.7–4.0)
MCH: 29.4 pg (ref 26.0–34.0)
MCHC: 32.2 g/dL (ref 30.0–36.0)
MCV: 91.4 fL (ref 80.0–100.0)
MONO ABS: 0.7 10*3/uL (ref 0.1–1.0)
Monocytes Relative: 9 %
Neutro Abs: 5.6 10*3/uL (ref 1.7–7.7)
Neutrophils Relative %: 65 %
Platelet Count: 294 10*3/uL (ref 150–400)
RBC: 4.42 MIL/uL (ref 3.87–5.11)
RDW: 13.4 % (ref 11.5–15.5)
WBC: 8.5 10*3/uL (ref 4.0–10.5)
nRBC: 0 % (ref 0.0–0.2)

## 2018-02-07 MED ORDER — AMOXICILLIN-POT CLAVULANATE 875-125 MG PO TABS: 1.0000 | ORAL_TABLET | Freq: Two times a day (BID) | ORAL | 0 refills | Status: DC

## 2018-02-07 MED FILL — AMOX-CLAV 875-125 MG TABLET: 875-125 | 10 days supply | Qty: 20 | Fill #0

## 2018-02-07 NOTE — Patient Instructions (Signed)
It was good to see you today-  I think that this issue with your salivary glands will resolve but we will watch it closely Use sour candies several times a day for 5-7 days.  We will also use a course of augmetin antibiotic and test for mumps  Please let me know how you are doing

## 2018-02-07 NOTE — Progress Notes (Signed)
[facility] [address], [place], [place] [address] [phone]  Date:  02/07/2018   Name:  Erin LEFEBER   DOB:  [birth year]   MRN:  [id number]  PCP:  Darreld Mclean, MD    Chief Complaint: Enlarged glands (enlarged saliva glands, trouble swallowing, painful to chew and swallow, trouble sleeping, off and on fever, chills)   History of Present Illness:  Erin Fields is a 52 y.o. very pleasant female patient who presents with the following:  Emery had recently contacted me with concern of enlarged salivary glands for 2-3 weeks- the parotid glands are her concern History of diabetes, breast cancer followed by Dr. Burr Medico- seen earlier to day and she did not think that this is cancerous at all  Her pulmonologist Dr. Lenna Gilford was concerned about her salivary glands as well at their visit on 10/8.  She had mumps as child so should have lifelong immunity. The parotid glands are enlarged and just slightly tender No fever or chills She is able to eat and swallow ok  No other enlarged glands noted She did take a round of abx a few weeks ago for a dental infection  Patient Active Problem List   Diagnosis Date Noted  . Anxiety and depression 01/20/2018  . Right upper lobe pulmonary nodule 01/10/2018  . Nasal polyp, unspecified 01/10/2018  . Left sided abdominal pain 12/13/2017  . Impaired fasting glucose 12/12/2017  . History of adenomatous polyp of colon 11/19/2017  . Genetic testing 07/16/2017  . Family history of cancer   . Controlled type 2 diabetes mellitus without complication, without long-term current use of insulin ([place]) 05/28/2017  . Malignant neoplasm of upper-outer quadrant of right breast in female, estrogen receptor positive ([place]) 03/20/2017  . Crohn disease ([place]) 11/29/2016  . Borderline diabetes 12/17/2015  . Ex-smoker 11/18/2015  . Chronic venous insufficiency 09/02/2015  . Symptomatic varicose veins, right  09/02/2015  . Eustachian tube dysfunction 12/04/2014  . Allergic rhinitis 09/15/2014  . Dyspnea 09/15/2014  . Liver hemangioma 08/24/2014  . Acute sinusitis with symptoms greater than 10 days 02/13/2014  . Elevated triglycerides with high cholesterol 11/26/2013  . Insomnia 08/20/2013  . Hypertension 08/20/2013  . Ulcerative colitis ([place]) 04/20/2013  . Oral herpes 04/20/2013  . Hyperlipidemia 12/06/2006  . HEADACHE 12/06/2006  . CERVICAL CANCER, HX OF 12/06/2006  . Hypothyroidism 11/29/2006  . Asthma 11/29/2006  . IRRITABLE BOWEL SYNDROME 11/29/2006    Past Medical History:  Diagnosis Date  . Allergy   . Anxiety   . Asthma   . Cancer [facility])    right breast cancer  . Chicken pox   . Colon polyps   . Depression   . Elevated blood pressure   . Environmental allergies   . Family history of cancer   . Frequent headaches   . GERD (gastroesophageal reflux disease)   . Hay fever   . Hyperlipidemia   . Hypertension   . Hypothyroidism   . Migraines   . Personal history of radiation therapy   . PONV (postoperative nausea and vomiting)   . Thyroid disease   . Ulcerative colitis ([place])   . Varicose veins     Past Surgical History:  Procedure Laterality Date  . ABLATION  2011  . BREAST BIOPSY Right 02/2017  . BREAST LUMPECTOMY Right 04/2017  . BREAST LUMPECTOMY WITH RADIOACTIVE SEED AND SENTINEL LYMPH NODE BIOPSY Right 04/24/2017   Procedure: RIGHT  BREAST LUMPECTOMY WITH RADIOACTIVE SEED AND SENTINEL LYMPH NODE BIOPSY;  Surgeon: [name], MD;  Location: [facility];  Service: General;  Laterality: Right;  . FOOT SURGERY Right 2012  . HAND SURGERY Right   . RE-EXCISION OF BREAST LUMPECTOMY Right 05/15/2017   Procedure: RE-EXCISION OF BREAST LUMPECTOMY;  Surgeon: [name], MD;  Location: [facility];  Service: General;  Laterality: Right;  . Septoplasty with turbinate reduction    . TONSILLECTOMY  1980  . VEIN SURGERY     Removal Right Leg   . WISDOM TOOTH EXTRACTION      Social History   Tobacco Use  . Smoking status: Former Smoker    Packs/day: 4.00    Years: 15.00    Pack years: 60.00    Types: Cigarettes    Last attempt to quit: 09/14/1993    Years since quitting: 24.4  . Smokeless tobacco: Never Used  . Tobacco comment: Quit >20 yrs ago  Substance Use Topics  . Alcohol use: Yes    Comment: Drinks a couple every 2-3 months  . Drug use: No    Family History  Problem Relation Age of Onset  . Arthritis Mother 55       Deceased  . Breast cancer Mother   . Lung cancer Mother   . Hyperlipidemia Mother   . Heart disease Mother   . Hypertension Mother   . Diabetes Mother   . Crohn's disease Mother   . Diabetes Maternal Grandfather   . Heart disease Maternal Grandfather   . Heart attack Maternal Grandfather   . Leukemia Maternal Grandfather   . Colitis Sister   . Leukemia Cousin 3       mat cousin    Allergies  Allergen Reactions  . Ace Inhibitors Other (See Comments)    Angina  . Aspirin   . Cefaclor   . Lisinopril Cough  . Losartan Potassium   . Oxycodone Hives  . Sulfa Antibiotics Other (See Comments)  . Sulfonamide Derivatives     Medication list has been reviewed and updated.  Current Outpatient Medications on File Prior to Visit  Medication Sig Dispense Refill  . acyclovir (ZOVIRAX) 400 MG tablet TAKE 1 TABLET BY MOUTH THREE TIMES A DAY AS NEEDED 90 tablet 1  . azelastine (ASTELIN) 0.1 % nasal spray 2 sprays 2 (two) times daily.  6  . budesonide-formoterol (SYMBICORT) 160-4.5 MCG/ACT inhaler Inhale 2 puffs into the lungs 2 (two) times daily.    . diazepam (VALIUM) 2 MG tablet Take 1 tablet (2 mg total) by mouth every 6 (six) hours as needed for anxiety. Do not combine with ambien 30 tablet 1  . Diclofenac Sodium (PENNSAID) 2 % SOLN Pen[place]aid 20 mg/gram/actuation (2 %) topical soln in metered-dose pump  APPLY TWO PUMPS TOPICALLY TO THE AFFECTED AREA TWICE DAILY    . EPINEPHrine 0.3 mg/0.3  mL IJ SOAJ injection epinephrine 0.3 mg/0.3 mL injection, auto-injector  TK UTD PRN    . exemestane (AROMASIN) 25 MG tablet Take 1 tablet (25 mg total) by mouth daily after breakfast. 90 tablet 1  . hyoscyamine (ANASPAZ) 0.125 MG TBDP disintergrating tablet hyoscyamine 0.125 mg disintegrating tablet    . lansoprazole (PREVACID) 30 MG capsule Take 30 mg by mouth daily at 12 noon.    . levalbuterol (XOPENEX HFA) 45 MCG/ACT inhaler Inhale 2 puffs into the lungs every 4 (four) hours as needed for wheezing (Q4-6 Hours PRN).    Marland Kitchen levocetirizine (XYZAL) 5  MG tablet levocetirizine 5 mg tablet  TK 1 T PO QD IN THE EVE    . levothyroxine (SYNTHROID, LEVOTHROID) 88 MCG tablet Take 1 tablet (88 mcg total) by mouth daily. 90 tablet 3  . levothyroxine (SYNTHROID, LEVOTHROID) 88 MCG tablet Take 6 tablets (530 mcg total) by mouth daily before breakfast. 90 tablet 1  . mesalamine (LIALDA) 1.2 G EC tablet Take 2.4 g by mouth daily.    . montelukast (SINGULAIR) 10 MG tablet Take 10 mg by mouth at bedtime.     Marland Kitchen olmesartan (BENICAR) 40 MG tablet TAKE 1 TABLET BY MOUTH EVERY DAY 90 tablet 1  . promethazine (PHENERGAN) 25 MG tablet TAKE 1/2-1 TABLET DAILY AS NEEDED FOR NAUSEA 30 tablet 0  . rosuvastatin (CRESTOR) 10 MG tablet Take 1 tablet (10 mg total) by mouth daily. 90 tablet 3  . triamcinolone (NASACORT ALLERGY 24HR) 55 MCG/ACT AERO nasal inhaler Place 1 spray into the nose daily.    Marland Kitchen venlafaxine XR (EFFEXOR-XR) 150 MG 24 hr capsule TAKE 1 BY MOUTH EVERY DAY WITH BREAKFAST 90 capsule 3  . zolpidem (AMBIEN) 5 MG tablet TAKE 1 TABLET BY MOUTH EVERY DAY AT BEDTIME AS NEEDED FOR SLEEP 30 tablet 1   No current facility-administered medications on file prior to visit.     Review of Systems:  As per HPI- otherwise negative. No CP or SOB No vomiting or diarrhea  Physical Examination: Vitals:   02/07/18 1630  BP: 124/80  Pulse: (!) 118  Resp: 16  Temp: 97.9 F (36.6 C)  SpO2: 98%   Vitals:   02/07/18  1630  Weight: 188 lb (85.3 kg)  Height: 5' 3.5" (1.613 m)   Body mass index is 32.78 kg/m. Ideal Body Weight: Weight in (lb) to have BMI = 25: 143.1  GEN: WDWN, NAD, Non-toxic, A & O x 3, obese, looks well  HEENT: Atraumatic, Normocephalic. Neck supple. No masses, No LAD.  Bilateral TM wnl, oropharynx normal.  PEERL,EOMI.   Bilateral parotid salivary glands are slightly enlarged and symmetrical. Careful exam of neck and oral cavity does not reveal any other abnl  Ears and Nose: No external deformity. CV: RRR, No M/G/R. No JVD. No thrill. No extra heart sounds. PULM: CTA B, no wheezes, crackles, rhonchi. No retractions. No resp. distress. No accessory muscle use. EXTR: No c/c/e NEURO Normal gait.  PSYCH: Normally interactive. Conversant. Not depressed or anxious appearing.  Calm demeanor.   History of cefaclor allergy Pt can tolerate Augmentin and has taken it in the recent past  Chart review reveals numerous amox and augmentin rx  Assessment and Plan: Parotid gland fullness - Plan: Mumps Antibody, IgM, Mumps antibody, IgG, amoxicillin-clavulanate (AUGMENTIN) 875-125 MG tablet  Here today with parotid gland enlargement.  Likely benign.  Pt is quite worried however, and would like to try a course of abx which is reasonable.  This is certainly not fulminant parotiditis  Treat with augmetnin for one week Encouraged sour candies/ gum several times a day to help increase salivation She will let me know if this does not resolve her issue Will test for mumps Meds ordered this encounter  Medications  . amoxicillin-clavulanate (AUGMENTIN) 875-125 MG tablet    Sig: Take 1 tablet by mouth 2 (two) times daily.    Dispense:  20 tablet    Refill:  0   Results for orders placed or performed in visit on 02/07/18  Mumps antibody, IgG  Result Value Ref Range   Mumps IgG 247.00  AU/mL     Signed Lamar Blinks, MD

## 2018-02-08 ENCOUNTER — Telehealth: Payer: Self-pay | Admitting: Hematology

## 2018-02-08 NOTE — Telephone Encounter (Signed)
Scheduled per 10/24 los- sent reminder in the mail

## 2018-02-09 ENCOUNTER — Encounter: Payer: Self-pay | Admitting: Family Medicine

## 2018-02-12 ENCOUNTER — Encounter: Payer: Self-pay | Admitting: Hematology

## 2018-02-12 ENCOUNTER — Other Ambulatory Visit: Payer: Self-pay | Admitting: Family Medicine

## 2018-02-12 ENCOUNTER — Encounter: Payer: Self-pay | Admitting: Family Medicine

## 2018-02-12 DIAGNOSIS — B001 Herpesviral vesicular dermatitis: Secondary | ICD-10-CM

## 2018-02-12 LAB — MUMPS ANTIBODY, IGM

## 2018-02-12 LAB — MUMPS ANTIBODY, IGG: Mumps IgG: 247 AU/mL

## 2018-02-14 ENCOUNTER — Telehealth: Payer: Self-pay

## 2018-02-14 NOTE — Telephone Encounter (Signed)
Faxed signed order to [facility], sent to scan into chart.

## 2018-03-21 ENCOUNTER — Other Ambulatory Visit: Payer: Self-pay | Admitting: Family Medicine

## 2018-03-21 DIAGNOSIS — F5104 Psychophysiologic insomnia: Secondary | ICD-10-CM

## 2018-03-22 NOTE — Telephone Encounter (Signed)
NCCSR:   02/18/2018  3   01/18/2018  Zolpidem Tartrate 5 Mg Tablet  30.00 30 Je Cop  [id number]  [place] ([id number])  1/1 0.25 LME Comm Ins  [place]  01/18/2018  3   01/18/2018  Zolpidem Tartrate 5 Mg Tablet  30.00 30 Je Cop  [id number]  [place] ([id number])  0/1 0.25 LME Comm Ins  [place]  12/19/2017  3   11/20/2017  Zolpidem Tartrate 5 Mg Tablet  30.00 30 Je Cop  [id number]  [place] ([id number])  1/1 0.25 LME Comm Ins  [place]  11/20/2017  3   11/20/2017  Zolpidem Tartrate 5 Mg Tablet  30.00 30 Je Cop  [id number]  [place] ([id number])  0/1 0.25 LME Comm Ins  [place]  10/18/2017  3   09/18/2017  Zolpidem Tartrate 5 Mg Tablet  30.00 30 Je Cop  [id number]  [place] ([id number])  1/1 0.25 LME Comm Ins  [place]  09/19/2017  3   09/18/2017  Zolpidem Tartrate 5 Mg Tablet  30.00 30 Je Cop  [id number]  [place] ([id number])  0/1 0.25 LME Comm Ins  [place]  08/30/2017  3   06/27/2017  Diazepam 2 Mg Tablet  30.00 8 Je Cop  [id number]  [place] ([id number])  1/1 0.75 LME Comm Ins  [place]  08/17/2017  3   07/18/2017  Zolpidem Tartrate 5 Mg Tablet  30.00 30 Je Cop  [id number]  [facility] ([id number])  0/0 0.25 LME Comm

## 2018-03-22 NOTE — Telephone Encounter (Signed)
Requesting:ambien Contract:no UDS: Last OV:02/07/18 Next OV:not scheduled  Last Refill:01/18/18 #[id number] Database:   Please advise

## 2018-04-06 NOTE — Progress Notes (Addendum)
[facility] [address] [place], [place] [address] [phone]  Date:  04/08/2018   Name:  Erin Fields   DOB:  02/13/1966   MRN:  [id number]  PCP:  Darreld Mclean, MD    Chief Complaint: Wrist Pain (left wrist tingling and pain, knot on wrist, pins and needles)   History of Present Illness:  Erin Fields is a 52 y.o. very pleasant female patient who presents with the following:  Checking in for periodic blood work today.  History of diabetes, breast cancer dx 2016/07/03, hypertension, asthma, ulcerative colitis, hypothyroidism  Last seen here in October, at which point she was concerned about fullness of her parotid glands.  We tested her for mumps which was negative and treated with augmentin  Lab Results  Component Value Date   HGBA1C 6.3 09/24/2017   Her diabetes is generally been under fine control with diet only  Zala has been under a lot of stress recently, with her breast cancer diagnosis, also her husband died in an accident in 07-03-16, following her daughter who died in 07/04/2015 due to accidental overdose.  She has a son, who has been doing well.  He has started seeing a counselor recently to deal with recent stresses.  This does seem to be helping him.  She has developed a "kno"t on the dorsum of her left wrist but it not sure how long it has been there She is getting some pins and needles in her left hand; this varies during the day, and is worse with increased use of her hands She has been wearing wrist splints at night for about 3 weeks but this does not seem to be helping Dr. French Ana had treated her for carpal tunnel impingement on the right side in the past.  She does feel that this may be carpal tunnel again, this time on the left  She is using a cholesterol med- this does cause some joint pains Her cardiologist really wants her to take her cholesterol medication due to to arterial plaque seen on a recent  chest CT She is using aleve, this helps some but meloxicam is better  She would like a refill of meloxicam if possible  Lab Results  Component Value Date   TSH 0.62 05/28/2017   Per her most recent follow-up visit with Dr. Krista Blue: 1. Malignant neoplasm of upper-outer quadrant of right breast, invasive lobular carcinoma, stage IA, pT2N0M0, ER/PR: Positive, HER2 negative, Grade II --I discussed her surgical path result in details -the Oncotype Dx result was reviewed with her in details. Her Oncotyple recurrence score was 3, her distant recurrence score with Tamoxifen was 3% and there is no benefit of adjuvant chemotherapy.  The very low risk disease. -She began adjuvant RT with Dr. [name] on 06/26/17 and plans to complete on 08/09/17 --She has started adjuvant antiestrogen therapy. She was previously started on Letrozole, but didn't tolerate and was switched to exemestane in 09/2017, tolerating well, will continue for 5-7 years -Pt had a mammogram done on 07/18/17 and 08/06/17 to evaluate a seroma. Results showed no evidence of malignancy. Next mammogram will be due in April 2020.  -We also discussed the breast cancer surveillance after her surgery. She will continue annual screening mammogram, self exam, and a routine office visit with lab and exam with [place]. -I encouraged her to have healthy diet and exercise regularly -Labs reviewed, CBC is WNLs and CMP is pending. -  F/u in 4 months 2. Genetic Testing -She is not a strong candidate for genetics given her unknown family history of cancer. She is insistent on doing genetic testing for herself and her son.  -I previously sent genetic referral  -Genetic testing was negative 3. Anxiety  -Given her recent loss of husband and daughter I offered social work referral. She declined at this time.  -She is currently on Valium daily managed by Dr. Lorelei Pont 4. Bone Health  -I discussed how long term use of aromatase inhibitor can potentially weaken the bones -Her  recent bone density scan from August 13, 2017 was normal. I discussed with pt  5. Bilateral tender and enlarged parotid glands -She had a soar throat, sinusitis, and a dental procedure recently -I do not suspect malignancy. Most likely cause is infectious/inflammatory, she is not febrile -I advised her to f/u with PCP PLAN:  -continue exemestane  -f/u in 4 months with labs, will order mammogram next visit   Due for a TSH and A1c today. She is fasting today  She has a bit of thyroid med on hand but needs a refill for 90 days to be sent in once we get her TSH ?  Pap smear- done per Dr. Deatra Ina, she is not due yet   Erin Fields admits to having up-and-down moods.  She is on Effexor 150, and notes that higher doses caused more side effects.  She does not wish to increase her dose at this time, and feels like she is handling her stress pretty well. She is not seeing a therapist herself, but would like to perhaps start -we went over some options so she can find a therapist No SI   astelin symbicort Valium aromasin hycosamine Prevacid Synthroid 88 lialda crestor effexor ambien olmesartan  BP Readings from Last 3 Encounters:  04/08/18 128/80  02/07/18 124/80  02/07/18 (!) 146/84   She has minimal renal impariement on most recent labs -check a BMP today  Pt notes that her issue with aspirin was stomach upset only, no history of hives or rash.  She has used meloxicam recently without any problems  Patient Active Problem List   Diagnosis Date Noted  . Anxiety and depression 01/20/2018  . Right upper lobe pulmonary nodule 01/10/2018  . Nasal polyp, unspecified 01/10/2018  . Left sided abdominal pain 12/13/2017  . Impaired fasting glucose 12/12/2017  . History of adenomatous polyp of colon 11/19/2017  . Genetic testing 07/16/2017  . Family history of cancer   . Controlled type 2 diabetes mellitus without complication, without long-term current use of insulin ([place]) 05/28/2017  .  Malignant neoplasm of upper-outer quadrant of right breast in female, estrogen receptor positive ([place]) 03/20/2017  . Crohn disease ([place]) 11/29/2016  . Borderline diabetes 12/17/2015  . Ex-smoker 11/18/2015  . Chronic venous insufficiency 09/02/2015  . Symptomatic varicose veins, right 09/02/2015  . Eustachian tube dysfunction 12/04/2014  . Allergic rhinitis 09/15/2014  . Dyspnea 09/15/2014  . Liver hemangioma 08/24/2014  . Elevated triglycerides with high cholesterol 11/26/2013  . Insomnia 08/20/2013  . Hypertension 08/20/2013  . Ulcerative colitis ([place]) 04/20/2013  . Oral herpes 04/20/2013  . Hyperlipidemia 12/06/2006  . HEADACHE 12/06/2006  . CERVICAL CANCER, HX OF 12/06/2006  . Hypothyroidism 11/29/2006  . Asthma 11/29/2006  . IRRITABLE BOWEL SYNDROME 11/29/2006    Past Medical History:  Diagnosis Date  . Allergy   . Anxiety   . Asthma   . Cancer [facility])    right breast cancer  .  Chicken pox   . Colon polyps   . Depression   . Elevated blood pressure   . Environmental allergies   . Family history of cancer   . Frequent headaches   . GERD (gastroesophageal reflux disease)   . Hay fever   . Hyperlipidemia   . Hypertension   . Hypothyroidism   . Migraines   . Personal history of radiation therapy   . PONV (postoperative nausea and vomiting)   . Thyroid disease   . Ulcerative colitis ([place])   . Varicose veins     Past Surgical History:  Procedure Laterality Date  . ABLATION  2011  . BREAST BIOPSY Right 02/2017  . BREAST LUMPECTOMY Right 04/2017  . BREAST LUMPECTOMY WITH RADIOACTIVE SEED AND SENTINEL LYMPH NODE BIOPSY Right 04/24/2017   Procedure: RIGHT BREAST LUMPECTOMY WITH RADIOACTIVE SEED AND SENTINEL LYMPH NODE BIOPSY;  Surgeon: [name], MD;  Location: [facility];  Service: General;  Laterality: Right;  . FOOT SURGERY Right 2012  . HAND SURGERY Right   . RE-EXCISION OF BREAST LUMPECTOMY Right 05/15/2017   Procedure: RE-EXCISION OF BREAST  LUMPECTOMY;  Surgeon: [name], MD;  Location: [facility];  Service: General;  Laterality: Right;  . Septoplasty with turbinate reduction    . TONSILLECTOMY  1980  . VEIN SURGERY     Removal Right Leg  . WISDOM TOOTH EXTRACTION      Social History   Tobacco Use  . Smoking status: Former Smoker    Packs/day: 4.00    Years: 15.00    Pack years: 60.00    Types: Cigarettes    Last attempt to quit: 09/14/1993    Years since quitting: 24.5  . Smokeless tobacco: Never Used  . Tobacco comment: Quit >20 yrs ago  Substance Use Topics  . Alcohol use: Yes    Comment: Drinks a couple every 2-3 months  . Drug use: No    Family History  Problem Relation Age of Onset  . Arthritis Mother 60       Deceased  . Breast cancer Mother   . Lung cancer Mother   . Hyperlipidemia Mother   . Heart disease Mother   . Hypertension Mother   . Diabetes Mother   . Crohn's disease Mother   . Diabetes Maternal Grandfather   . Heart disease Maternal Grandfather   . Heart attack Maternal Grandfather   . Leukemia Maternal Grandfather   . Colitis Sister   . Leukemia Cousin 3       mat cousin    Allergies  Allergen Reactions  . Ace Inhibitors Other (See Comments)    Angina  . Aspirin   . Cefaclor   . Lisinopril Cough  . Losartan Potassium   . Oxycodone Hives  . Sulfa Antibiotics Other (See Comments)  . Sulfonamide Derivatives     Medication list has been reviewed and updated.  Current Outpatient Medications on File Prior to Visit  Medication Sig Dispense Refill  . acyclovir (ZOVIRAX) 400 MG tablet TAKE 1 TABLET BY MOUTH THREE TIMES A DAY AS NEEDED 90 tablet 1  . azelastine (ASTELIN) 0.1 % nasal spray 2 sprays 2 (two) times daily.  6  . budesonide-formoterol (SYMBICORT) 160-4.5 MCG/ACT inhaler Inhale 2 puffs into the lungs 2 (two) times daily.    . diazepam (VALIUM) 2 MG tablet Take 1 tablet (2 mg total) by mouth every 6 (six) hours as needed for anxiety. Do not combine  with ambien 30 tablet  1  . Diclofenac Sodium (PENNSAID) 2 % SOLN [place] 20 mg/gram/actuation (2 %) topical soln in metered-dose pump  APPLY TWO PUMPS TOPICALLY TO THE AFFECTED AREA TWICE DAILY    . EPINEPHrine 0.3 mg/0.3 mL IJ SOAJ injection epinephrine 0.3 mg/0.3 mL injection, auto-injector  TK UTD PRN    . exemestane (AROMASIN) 25 MG tablet Take 1 tablet (25 mg total) by mouth daily after breakfast. 90 tablet 1  . hyoscyamine (ANASPAZ) 0.125 MG TBDP disintergrating tablet hyoscyamine 0.125 mg disintegrating tablet    . lansoprazole (PREVACID) 30 MG capsule Take 30 mg by mouth daily at 12 noon.    . levalbuterol (XOPENEX HFA) 45 MCG/ACT inhaler Inhale 2 puffs into the lungs every 4 (four) hours as needed for wheezing (Q4-6 Hours PRN).    Marland Kitchen levocetirizine (XYZAL) 5 MG tablet levocetirizine 5 mg tablet  TK 1 T PO QD IN THE EVE    . levothyroxine (SYNTHROID, LEVOTHROID) 88 MCG tablet Take 1 tablet (88 mcg total) by mouth daily. 90 tablet 3  . levothyroxine (SYNTHROID, LEVOTHROID) 88 MCG tablet Take 6 tablets (530 mcg total) by mouth daily before breakfast. 90 tablet 1  . mesalamine (LIALDA) 1.2 G EC tablet Take 2.4 g by mouth daily.    . montelukast (SINGULAIR) 10 MG tablet Take 10 mg by mouth at bedtime.     Marland Kitchen olmesartan (BENICAR) 40 MG tablet TAKE 1 TABLET BY MOUTH EVERY DAY 90 tablet 1  . promethazine (PHENERGAN) 25 MG tablet TAKE 1/2-1 TABLET DAILY AS NEEDED FOR NAUSEA 30 tablet 0  . rosuvastatin (CRESTOR) 10 MG tablet Take 1 tablet (10 mg total) by mouth daily. 90 tablet 3  . triamcinolone (NASACORT ALLERGY 24HR) 55 MCG/ACT AERO nasal inhaler Place 1 spray into the nose daily.    Marland Kitchen venlafaxine XR (EFFEXOR-XR) 150 MG 24 hr capsule TAKE 1 BY MOUTH EVERY DAY WITH BREAKFAST 90 capsule 3  . zolpidem (AMBIEN) 5 MG tablet TAKE 1 TABLET BY MOUTH AT BEDTIME AS NEEDED FOR SLEEP 30 tablet 3   No current facility-administered medications on file prior to visit.     Review of Systems:  As per HPI-  otherwise negative. No fever or chills, no chest pain or shortness of breath   Physical Examination: Vitals:   04/08/18 1016  BP: 128/80  Pulse: 92  Resp: 16  SpO2: 95%   Vitals:   04/08/18 1016  Weight: 186 lb (84.4 kg)  Height: 5' 3.5" (1.613 m)   Body mass index is 32.43 kg/m. Ideal Body Weight: Weight in (lb) to have BMI = 25: 143.1  GEN: WDWN, NAD, Non-toxic, A & O x 3, overweight, looks well HEENT: Atraumatic, Normocephalic. Neck supple. No masses, No LAD. Ears and Nose: No external deformity. CV: RRR, No M/G/R. No JVD. No thrill. No extra heart sounds. PULM: CTA B, no wheezes, crackles, rhonchi. No retractions. No resp. distress. No accessory muscle use. EXTR: No c/c/e NEURO Normal gait.  PSYCH: Normally interactive. Conversant. Not depressed or anxious appearing.  Calm demeanor.  Left wrist: There is mild fullness on the dorsal aspect of the wrist, but no discrete mass.  This likely represents some mild edema. Negative Finkelstein's maneuver. Wrist displays normal range of motion, no heat, or redness. She does notice tingling with percussion over the carpal tunnel  Assessment and Plan: Acquired hypothyroidism - Plan: TSH  Controlled type 2 diabetes mellitus without complication, without long-term current use of insulin ([place]) - Plan: Hemoglobin C5E, Basic metabolic panel  Mixed  hyperlipidemia - Plan: Lipid panel, Basic metabolic panel  Left wrist pain - Plan: meloxicam (MOBIC) 15 MG tablet  Arthralgia, unspecified joint - Plan: meloxicam (MOBIC) 15 MG tablet  Anxiety associated with cancer diagnosis  History of recent stressful life event  Prescription for meloxicam to use for both her wrist discomfort, and more generalized aches triggered by her statin. She will call her orthopedist and arrange a follow-up so he may examine her wrist. Other labs pain as above. Continue Effexor, she will look into getting a therapist Patient will need a prescription for her  thyroid medicine called in once we receive her TSH  Signed Lamar Blinks, MD  Received her labs, refilled thyroid, message to pt  Results for orders placed or performed in visit on 04/08/18  Hemoglobin A1c  Result Value Ref Range   Hgb A1c MFr Bld 7.1 (H) 4.6 - 6.5 %  TSH  Result Value Ref Range   TSH 0.66 0.35 - 4.50 uIU/mL  Lipid panel  Result Value Ref Range   Cholesterol 125 0 - 200 mg/dL   Triglycerides 196.0 (H) 0.0 - 149.0 mg/dL   HDL 31.70 (L) >39.00 mg/dL   VLDL 39.2 0.0 - 40.0 mg/dL   LDL Cholesterol 54 0 - 99 mg/dL   Total CHOL/HDL Ratio 4    NonHDL 10.31   Basic metabolic panel  Result Value Ref Range   Sodium 141 135 - 145 mEq/L   Potassium 4.5 3.5 - 5.1 mEq/L   Chloride 105 96 - 112 mEq/L   CO2 26 19 - 32 mEq/L   Glucose, Bld 128 (H) 70 - 99 mg/dL   BUN 8 6 - 23 mg/dL   Creatinine, Ser 0.66 0.40 - 1.20 mg/dL   Calcium 9.7 8.4 - 10.5 mg/dL   GFR 98.84 >60.00 mL/min

## 2018-04-08 ENCOUNTER — Ambulatory Visit: Payer: BLUE CROSS/BLUE SHIELD | Admitting: Family Medicine

## 2018-04-08 ENCOUNTER — Encounter: Payer: Self-pay | Admitting: Family Medicine

## 2018-04-08 VITALS — BP 128/80 | HR 92 | Resp 16 | Ht 63.5 in | Wt 186.0 lb

## 2018-04-08 DIAGNOSIS — E782 Mixed hyperlipidemia: Secondary | ICD-10-CM | POA: Diagnosis not present

## 2018-04-08 DIAGNOSIS — E119 Type 2 diabetes mellitus without complications: Secondary | ICD-10-CM

## 2018-04-08 DIAGNOSIS — F418 Other specified anxiety disorders: Secondary | ICD-10-CM

## 2018-04-08 DIAGNOSIS — M25532 Pain in left wrist: Secondary | ICD-10-CM

## 2018-04-08 DIAGNOSIS — E039 Hypothyroidism, unspecified: Secondary | ICD-10-CM | POA: Diagnosis not present

## 2018-04-08 DIAGNOSIS — Z8659 Personal history of other mental and behavioral disorders: Secondary | ICD-10-CM

## 2018-04-08 DIAGNOSIS — E034 Atrophy of thyroid (acquired): Secondary | ICD-10-CM

## 2018-04-08 DIAGNOSIS — M2550 Pain in unspecified joint: Secondary | ICD-10-CM

## 2018-04-08 DIAGNOSIS — F411 Generalized anxiety disorder: Secondary | ICD-10-CM

## 2018-04-08 DIAGNOSIS — C801 Malignant (primary) neoplasm, unspecified: Secondary | ICD-10-CM

## 2018-04-08 LAB — BASIC METABOLIC PANEL
BUN: 8 mg/dL (ref 6–23)
CHLORIDE: 105 meq/L (ref 96–112)
CO2: 26 mEq/L (ref 19–32)
CREATININE: 0.66 mg/dL (ref 0.40–1.20)
Calcium: 9.7 mg/dL (ref 8.4–10.5)
GFR: 98.84 mL/min (ref >60.00)
Glucose, Bld: 128 mg/dL — ABNORMAL HIGH (ref 70–99)
POTASSIUM: 4.5 meq/L (ref 3.5–5.1)
Sodium: 141 mEq/L (ref 135–145)

## 2018-04-08 LAB — LIPID PANEL
CHOL/HDL RATIO: 4
Cholesterol: 125 mg/dL (ref 0–200)
HDL: 31.7 mg/dL — ABNORMAL LOW (ref >39.00)
LDL Cholesterol: 54 mg/dL (ref 0–99)
NONHDL: 93.55
Triglycerides: 196 mg/dL — ABNORMAL HIGH (ref 0.0–149.0)
VLDL: 39.2 mg/dL (ref 0.0–40.0)

## 2018-04-08 LAB — HEMOGLOBIN A1C: Hgb A1c MFr Bld: 7.1 % — ABNORMAL HIGH (ref 4.6–6.5)

## 2018-04-08 LAB — TSH: TSH: 0.66 u[IU]/mL (ref 0.35–4.50)

## 2018-04-08 MED ORDER — MELOXICAM 15 MG PO TABS: 7.5000 mg | ORAL_TABLET | Freq: Every day | ORAL | 1 refills | Status: DC

## 2018-04-08 MED ORDER — METFORMIN HCL 500 MG PO TABS: 500.0000 mg | ORAL_TABLET | Freq: Every day | ORAL | 3 refills | Status: DC

## 2018-04-08 MED ORDER — LEVOTHYROXINE SODIUM 88 MCG PO TABS: 88.0000 ug | ORAL_TABLET | Freq: Every day | ORAL | 3 refills | Status: DC

## 2018-04-08 NOTE — Patient Instructions (Addendum)
Good to see you today, I rhope you have a peaceful holiday season I will be touch with your labs ASAP. I will refill your thyroid medicine once you TSH comes in. Try the meloxicam for your joint pain, you can take a half or whole pill daily as needed. Please call Dr. Alvester Morin office to schedule a follow-up about your wrist. I gave you some information about the counseling services offered through the [facility], certainly contact them or the counselor of your choice.  I do think you may benefit from therapy given all that you have been through

## 2018-04-08 NOTE — Addendum Note (Signed)
Addended by: Lamar Blinks C on: 04/08/2018 05:35 PM   Modules accepted: Orders

## 2018-04-15 ENCOUNTER — Other Ambulatory Visit: Payer: Self-pay | Admitting: Family Medicine

## 2018-04-15 DIAGNOSIS — B001 Herpesviral vesicular dermatitis: Secondary | ICD-10-CM

## 2018-04-24 ENCOUNTER — Other Ambulatory Visit: Payer: Self-pay | Admitting: Family Medicine

## 2018-04-29 ENCOUNTER — Encounter: Payer: Self-pay | Admitting: Hematology

## 2018-04-30 ENCOUNTER — Encounter: Payer: Self-pay | Admitting: Family Medicine

## 2018-05-18 ENCOUNTER — Other Ambulatory Visit: Payer: Self-pay | Admitting: Family Medicine

## 2018-05-18 DIAGNOSIS — B001 Herpesviral vesicular dermatitis: Secondary | ICD-10-CM

## 2018-05-18 HISTORY — PX: OTHER SURGICAL HISTORY: SHX169

## 2018-05-29 ENCOUNTER — Telehealth: Payer: Self-pay | Admitting: Hematology

## 2018-05-29 NOTE — Telephone Encounter (Signed)
YF call day 2/24 - moved appointments to earlier in the day. Left message. Schedule mailed.

## 2018-05-30 ENCOUNTER — Encounter: Payer: Self-pay | Admitting: Hematology

## 2018-05-30 ENCOUNTER — Telehealth: Payer: Self-pay | Admitting: Hematology

## 2018-05-30 NOTE — Telephone Encounter (Signed)
Cancelled appt per 2/123 sch message for patient to call back to r/s appt .

## 2018-06-03 ENCOUNTER — Telehealth: Payer: Self-pay | Admitting: Hematology

## 2018-06-03 NOTE — Telephone Encounter (Signed)
Called patient per scheduling voicemail log.  Scheduled a lab and Md appt.

## 2018-06-10 ENCOUNTER — Other Ambulatory Visit: Payer: BLUE CROSS/BLUE SHIELD

## 2018-06-10 ENCOUNTER — Other Ambulatory Visit: Payer: Self-pay | Admitting: Hematology

## 2018-06-10 ENCOUNTER — Ambulatory Visit: Payer: BLUE CROSS/BLUE SHIELD | Admitting: Hematology

## 2018-06-10 DIAGNOSIS — C50411 Malignant neoplasm of upper-outer quadrant of right female breast: Secondary | ICD-10-CM

## 2018-06-10 DIAGNOSIS — Z170 Estrogen receptor positive status [ER+]: Secondary | ICD-10-CM

## 2018-06-13 NOTE — Progress Notes (Signed)
[place]   [phone] [phone]   Clinic Follow up Note   Patient Care Team: Copland, Gay Filler, MD as PCP - General (Family Medicine) [name], MD as Referring Physician (Obstetrics and Gynecology) [name], MD as Consulting Physician (Gastroenterology) [name], MD as Referring Physician (Allergy) [name], MD as Consulting Physician (General Surgery) [name], MD as Consulting Physician (Radiation Oncology) [name], MD as Consulting Physician (Hematology) Gardenia Phlegm, NP as Nurse Practitioner (Hematology and Oncology) 06/18/2018  CHIEF COMPLAINT: F/u malignant neoplasm of upper-outer quadrant of right breast   SUMMARY OF ONCOLOGIC HISTORY: Oncology History   Cancer Staging Malignant neoplasm of upper-outer quadrant of right breast in female, estrogen receptor positive ([place]) Staging form: Breast, AJCC 8th Edition - Clinical stage from 03/16/2017: Stage IA (cT1b, cN0, cM0, G2, ER: Positive, PR: Positive, HER2: Negative) - Signed by [name], MD on 03/28/2017 - Pathologic stage from 05/15/2017: Stage IA (pT2, pN0, cM0, G2, ER+, PR+, HER2-, Oncotype DX score: 3) - Signed by [name], MD on 08/06/2017       Malignant neoplasm of upper-outer quadrant of right breast in female, estrogen receptor positive ([place])   03/15/2017 Mammogram    Diagnostic Mammogram Right Breast  IMPRESSION: Palpable abnormality corresponds to distortion and focal mass associated acoustic shadowing measuring 0.8x0.9x0.9cm in the 930 o'clock location of the right breast 7 cm from the nipple.   RECOMMENDATION: Ultrasound-guided core biopsy is recommended and scheduled for the patient.    03/16/2017 Initial Biopsy    Biopsy Results  Diagnosis 03/16/17 Breast, right, needle core biopsy, 9:30 o'clock, ribbon clip placed - INVASIVE MAMMARY CARCINOMA. - SEE COMMENT.     03/16/2017 Receptors her2    Estrogen Receptor: 95%,  POSITIVE, STRONG STAINING INTENSITY Progesterone Receptor: 95%, POSITIVE, STRONG STAINING INTENSITY Proliferation Marker Ki67: 3% HER2: NEGATIVE    03/20/2017 Initial Diagnosis    Malignant neoplasm of upper-outer quadrant of right breast in female, estrogen receptor positive ([place])    04/24/2017 Surgery    Right Breast Lumpectomy with Sentinel Node Biopsy with Dr. [name]     04/24/2017 Pathology Results    Diagnosis 04/24/17 1. Breast, lumpectomy, Right w/seed - INVASIVE LOBULAR CARCINOMA, GRADE 2, SPANNING 2.2 CM. - LOBULAR NEOPLASIA (ATYPICAL LOBULAR HYPERPLASIA). - ANTERIOR SUPERIOR MARGIN IS BROADLY POSITIVE. - BIOPSY SITE. - SEE ONCOLOGY TABLE. 2. Breast, excision, Right additional Medial Margin - INVASIVE LOBULAR CARCINOMA. - LOBULAR CARCINOMA IN SITU. - INVASIVE CARCINOMA IS 0.3 CM FROM NEW MEDIAL MARGIN. 3. Lymph node, sentinel, biopsy, Right Axillary #1 - ISOLATED TUMOR CELLS IN ONE OF ONE LYMPH NODES (0/1). 4. Lymph node, sentinel, biopsy, Right Axillary #2 - ONE OF ONE LYMPH NODES NEGATIVE FOR CARCINOMA (0/1). 5. Lymph node, sentinel, biopsy, Right Axillary #3 - ONE OF ONE LYMPH NODES NEGATIVE FOR CARCINOMA (0/1).    04/24/2017 Oncotype testing    Her Oncotyple recurrence score was 3, her distant recurrence score with Tamoxifen was 3% and her adjuvant chemotherapy benefit was <1%.     05/15/2017 Surgery    Re-excision of Right Breast Lumpectomy with Dr. [name]     05/15/2017 Pathology Results    Diagnosis 05/15/17 Breast, excision, Right additional anterior margin - INVASIVE LOBULAR CARCINOMA. - ATYPICAL LOBULAR HYPERPLASIA. - THE NEW ANTERIOR SURGICAL RESECTION MARGIN IS NEGATIVE FOR CARCINOMA.    05/15/2017 Cancer Staging    Staging form: Breast, AJCC 8th Edition - Pathologic stage from 05/15/2017: Stage IA (pT2, pN0, cM0, G2, ER+, PR+, HER2-, Oncotype  DX score: 3) - Signed by [name], MD on 08/06/2017    06/26/2017 -  Radiation Therapy    Adjuvant RT with Dr. [name]      07/12/2017 Genetic Testing    Negative genetic testing on the multi-cancer panel.  The Multi-Gene Panel offered by [name] includes sequencing and/or deletion duplication testing of the following 83 genes: ALK, APC, ATM, AXIN2,BAP1,  BARD1, BLM, BMPR1A, BRCA1, BRCA2, BRIP1, CASR, CDC73, CDH1, CDK4, CDKN1B, CDKN1C, CDKN2A (p14ARF), CDKN2A (p16INK4a), CEBPA, CHEK2, CTNNA1, DICER1, DIS3L2, EGFR (c.2369C>T, p.Thr790Met variant only), EPCAM (Deletion/duplication testing only), FH, FLCN, GATA2, GPC3, GREM1 (Promoter region deletion/duplication testing only), HOXB13 (c.251G>A, p.Gly84Glu), HRAS, KIT, MAX, MEN1, MET, MITF (c.952G>A, p.Glu318Lys variant only), MLH1, MSH2, MSH3, MSH6, MUTYH, NBN, NF1, NF2, NTHL1, PALB2, PDGFRA, PHOX2B, PMS2, POLD1, POLE, POT1, PRKAR1A, PTCH1, PTEN, RAD50, RAD51C, RAD51D, RB1, RECQL4, RET, RUNX1, SDHAF2, SDHA (sequence changes only), SDHB, SDHC, SDHD, SMAD4, SMARCA4, SMARCB1, SMARCE1, STK11, SUFU, TERT, TERT, TMEM127, TP53, TSC1, TSC2, VHL, WRN and WT1.  The report date is July 12, 2017.     08/13/2017 Imaging    08/13/2017 DEXA ASSESSMENT: The BMD measured at AP Spine L1-L4 is 1.120 g/cm2 with a T-score of -0.5. This patient is considered normal according to [facility]) criteria.     08/2017 -  Anti-estrogen oral therapy    Letrozole daily, changed to Exemestane due to poor tolerance of Letrozole    01/16/2018 Imaging    01/16/2018 CT Chest IMPRESSION: Stable 2 mm RIGHT upper lobe nodule.  Post radiation therapy changes at the anterolateral RIGHT upper lobe.  Post RIGHT breast surgery and RIGHT axillary node dissection.  No new abnormalities.  Aortic Atherosclerosis (ICD10-I70.0).     CURRENT THERAPY  Adjuvant exemestane, started June 2019    INTERVAL HISTORY: Erin Fields is a 53 y.o. female who is here for follow-up. Today, she is here alone and is doing well. She recently had left hand surgery due to carpal tunnel. She is moderately  tolerating exemestane. She experiences severe diaphoresis constantly throughout the day and takes Effexor. She denies any other symptoms or pain.   Pertinent positives and negatives of review of systems are listed and detailed within the above HPI.  REVIEW OF SYSTEMS:  Constitutional: Denies fevers, chills or abnormal weight loss, (+) diaphoresis  Eyes: Denies blurriness of vision Ears, nose, mouth, throat, and face: Denies mucositis or sore throat Respiratory: Denies cough, dyspnea or wheezes Cardiovascular: Denies palpitation, chest discomfort or lower extremity swelling Gastrointestinal:  Denies nausea, heartburn or change in bowel habits Skin: Denies abnormal skin rashes Lymphatics: Denies new lymphadenopathy or easy bruising Neurological:Denies numbness, tingling or new weaknesses Behavioral/Psych: Mood is stable, no new changes  All other systems were reviewed with the patient and are negative.  MEDICAL HISTORY:  Past Medical History:  Diagnosis Date  . Allergy   . Anxiety   . Asthma   . Cancer [facility])    right breast cancer  . Chicken pox   . Colon polyps   . Depression   . Elevated blood pressure   . Environmental allergies   . Family history of cancer   . Frequent headaches   . GERD (gastroesophageal reflux disease)   . Hay fever   . Hyperlipidemia   . Hypertension   . Hypothyroidism   . Migraines   . Personal history of radiation therapy   . PONV (postoperative nausea and vomiting)   . Thyroid disease   . Ulcerative colitis ([place])   .  Varicose veins     SURGICAL HISTORY: Past Surgical History:  Procedure Laterality Date  . ABLATION  2011  . BREAST BIOPSY Right 02/2017  . BREAST LUMPECTOMY Right 04/2017  . BREAST LUMPECTOMY WITH RADIOACTIVE SEED AND SENTINEL LYMPH NODE BIOPSY Right 04/24/2017   Procedure: RIGHT BREAST LUMPECTOMY WITH RADIOACTIVE SEED AND SENTINEL LYMPH NODE BIOPSY;  Surgeon: [name], MD;  Location: [place];  Service:  General;  Laterality: Right;  . FOOT SURGERY Right 2012  . HAND SURGERY Right   . RE-EXCISION OF BREAST LUMPECTOMY Right 05/15/2017   Procedure: RE-EXCISION OF BREAST LUMPECTOMY;  Surgeon: [name], MD;  Location: [place];  Service: General;  Laterality: Right;  . Septoplasty with turbinate reduction    . TONSILLECTOMY  1980  . VEIN SURGERY     Removal Right Leg  . WISDOM TOOTH EXTRACTION      I have reviewed the social history and family history with the patient and they are unchanged from previous note.  ALLERGIES:  is allergic to ace inhibitors; aspirin; cefaclor; lisinopril; losartan potassium; oxycodone; sulfa antibiotics; and sulfonamide derivatives.  MEDICATIONS:  Current Outpatient Medications  Medication Sig Dispense Refill  . acyclovir (ZOVIRAX) 400 MG tablet TAKE 1 TABLET BY MOUTH THREE TIMES A DAY AS NEEDED 90 tablet 1  . azelastine (ASTELIN) 0.1 % nasal spray 2 sprays 2 (two) times daily.  6  . budesonide-formoterol (SYMBICORT) 160-4.5 MCG/ACT inhaler Inhale 2 puffs into the lungs 2 (two) times daily.    . diazepam (VALIUM) 2 MG tablet Take 1 tablet (2 mg total) by mouth every 6 (six) hours as needed for anxiety. Do not combine with ambien 30 tablet 1  . Diclofenac Sodium (PENNSAID) 2 % SOLN [place] 20 mg/gram/actuation (2 %) topical soln in metered-dose pump  APPLY TWO PUMPS TOPICALLY TO THE AFFECTED AREA TWICE DAILY    . exemestane (AROMASIN) 25 MG tablet TAKE 1 TABLET (25 MG TOTAL) BY MOUTH DAILY AFTER BREAKFAST. 90 tablet 1  . HYDROcodone-acetaminophen (NORCO/VICODIN) 5-325 MG tablet Take 1 tablet by mouth at bedtime.    . hyoscyamine (ANASPAZ) 0.125 MG TBDP disintergrating tablet hyoscyamine 0.125 mg disintegrating tablet    . lansoprazole (PREVACID) 30 MG capsule Take 30 mg by mouth daily at 12 noon.    . levalbuterol (XOPENEX HFA) 45 MCG/ACT inhaler Inhale 2 puffs into the lungs every 4 (four) hours as needed for wheezing (Q4-6 Hours PRN).    Marland Kitchen  levothyroxine (SYNTHROID, LEVOTHROID) 88 MCG tablet Take 1 tablet (88 mcg total) by mouth daily. 90 tablet 3  . meloxicam (MOBIC) 15 MG tablet Take 0.5-1 tablets (7.5-15 mg total) by mouth daily. 90 tablet 1  . mesalamine (LIALDA) 1.2 G EC tablet Take 2.4 g by mouth daily.    . metFORMIN (GLUCOPHAGE) 500 MG tablet Take 1 tablet (500 mg total) by mouth daily with breakfast. 90 tablet 3  . montelukast (SINGULAIR) 10 MG tablet Take 10 mg by mouth at bedtime.     Marland Kitchen olmesartan (BENICAR) 40 MG tablet TAKE 1 TABLET BY MOUTH EVERY DAY 90 tablet 1  . promethazine (PHENERGAN) 25 MG tablet TAKE 1/2-1 TABLET DAILY AS NEEDED FOR NAUSEA 30 tablet 0  . rosuvastatin (CRESTOR) 10 MG tablet Take 1 tablet (10 mg total) by mouth daily. 90 tablet 3  . triamcinolone (NASACORT ALLERGY 24HR) 55 MCG/ACT AERO nasal inhaler Place 1 spray into the nose daily.    Marland Kitchen venlafaxine XR (EFFEXOR-XR) 150 MG 24 hr capsule TAKE  1 BY MOUTH EVERY DAY WITH BREAKFAST 90 capsule 1  . zolpidem (AMBIEN) 5 MG tablet TAKE 1 TABLET BY MOUTH AT BEDTIME AS NEEDED FOR SLEEP 30 tablet 3  . EPINEPHrine 0.3 mg/0.3 mL IJ SOAJ injection epinephrine 0.3 mg/0.3 mL injection, auto-injector  TK UTD PRN     No current facility-administered medications for this visit.     PHYSICAL EXAMINATION: ECOG PERFORMANCE STATUS: 0 - Asymptomatic  Vitals:   06/18/18 1225  BP: (!) 147/89  Pulse: (!) 102  Resp: 20  Temp: 98.5 F (36.9 C)  SpO2: 96%   Filed Weights   06/18/18 1225  Weight: 184 lb (83.5 kg)    GENERAL:alert, no distress and comfortable SKIN: skin color, texture, turgor are normal, no rashes or significant lesions EYES: normal, Conjunctiva are pink and non-injected, sclera clear OROPHARYNX:no exudate, no erythema and lips, buccal mucosa, and tongue normal  NECK: supple, thyroid normal size, non-tender, without nodularity LYMPH:  no palpable lymphadenopathy in the cervical, axillary or inguinal LUNGS: clear to auscultation and percussion  with normal breathing effort HEART: regular rate & rhythm and no murmurs and no lower extremity edema ABDOMEN:abdomen soft, non-tender and normal bowel sounds Musculoskeletal:no cyanosis of digits and no clubbing  NEURO: alert & oriented x 3 with fluent speech, no focal motor/sensory deficits BREAST: s/p right breast lumpectomy, (+) scar tissue right breast, (+) mild lymphedema   LABORATORY DATA:  I have reviewed the data as listed CBC Latest Ref Rng & Units 06/18/2018 02/07/2018 11/05/2017  WBC 4.0 - 10.5 K/uL 8.2 8.5 8.1  Hemoglobin 12.0 - 15.0 g/dL 13.3 13.0 13.3  Hematocrit 36.0 - 46.0 % 40.2 40.4 40.5  Platelets 150 - 400 K/uL 355 294 333     CMP Latest Ref Rng & Units 06/18/2018 04/08/2018 02/07/2018  Glucose 70 - 99 mg/dL 154(H) 128(H) 244(H)  BUN 6 - 20 mg/dL 7 8 7   Creatinine 0.44 - 1.00 mg/dL 0.78 0.66 1.16(H)  Sodium 135 - 145 mmol/L 139 141 142  Potassium 3.5 - 5.1 mmol/L 4.2 4.5 4.4  Chloride 98 - 111 mmol/L 106 105 105  CO2 22 - 32 mmol/L 20(L) 26 25  Calcium 8.9 - 10.3 mg/dL 9.4 9.7 9.1  Total Protein 6.5 - 8.1 g/dL 6.7 - 6.6  Total Bilirubin 0.3 - 1.2 mg/dL 0.5 - 0.5  Alkaline Phos 38 - 126 U/L 80 - 78  AST 15 - 41 U/L 20 - 18  ALT 0 - 44 U/L 25 - 28      RADIOGRAPHIC STUDIES: I have personally reviewed the radiological images as listed and agreed with the findings in the report. No results found.   ASSESSMENT & PLAN:  KAWTHAR ENNEN is a 53 y.o. female with history of  1. Malignant neoplasm of upper-outer quadrant of right breast, invasive lobular carcinoma, stage IA, pT2N0M0, ER/PR: Positive, HER2 negative, Grade II -She was initially diagnosed on 03/16/2017.  -She has completed adjuvant radiation  -Due to low Oncotype recurrence score, adjuvant therapy was not recommended. - She started adjuvent anti-estrogen therapy, Letrozole on 08/2017 but switched to Exemestane due to poor tolerance. She is tolerating well, will continue for 7-10 years due to the lobular  histology - She will continue annual screening mammogram, self exam, and a routine office visit with lab and exam with [place]. - She has severe hot flush due to Exemestane but it is tolerable and will continue  -Labs reviewed, she is clinically doing well, exam was unremarkable, there is no  clinical concern for recurrence.  I  ordered a diagnostic mammogram to be done in April 2020.  -F/u in 4 months, then every 6 months after   2. Genetic Testing -Genetic testing was negative  3. Anxiety  -Given her recent loss of husband and daughter I offered social work referral. She declined at this time.  -She is currently on Valium daily managed by Dr. Lorelei Pont  4. Bone Health  -Her recent bone density scan from August 13, 2017 was normal.    PLAN:  - Continue exemestane  -f/u and lab in 4 months with NP Lacie  - I ordered diagnostic mammogram to be done in April      No problem-specific Assessment & Plan notes found for this encounter.   Orders Placed This Encounter  Procedures  . MM DIAG BREAST TOMO BILATERAL    INS BCBS COSIGN REQ PF 08/06/2017 BCG NO PROBLEMS / HX OF LEFT BREAST LUMPECTOMY 2019 / NO IMPLANTS / NO NEEDS / SWP JR     Standing Status:   Future    Standing Expiration Date:   06/18/2019    Order Specific Question:   Reason for Exam (SYMPTOM  OR DIAGNOSIS REQUIRED)    Answer:   HISTORY OF LEFT LUMPECTOMY 2019 / ANNUAL    Order Specific Question:   Is the patient pregnant?    Answer:   No    Order Specific Question:   Preferred imaging location?    Answer:   [facility]   All questions were answered. The patient knows to call the clinic with any problems, questions or concerns. No barriers to learning was detected. I spent 15 minutes counseling the patient face to face. The total time spent in the appointment was 20 minutes and more than 50% was on counseling and review of test results  I, Manson Allan am acting as scribe for Dr. [name].  I have reviewed the  above documentation for accuracy and completeness, and I agree with the above.     [name], MD 06/18/2018

## 2018-06-16 ENCOUNTER — Other Ambulatory Visit: Payer: Self-pay | Admitting: Family Medicine

## 2018-06-16 DIAGNOSIS — F430 Acute stress reaction: Secondary | ICD-10-CM

## 2018-06-18 ENCOUNTER — Inpatient Hospital Stay: Payer: BLUE CROSS/BLUE SHIELD | Attending: Family Medicine

## 2018-06-18 ENCOUNTER — Inpatient Hospital Stay (HOSPITAL_BASED_OUTPATIENT_CLINIC_OR_DEPARTMENT_OTHER): Payer: BLUE CROSS/BLUE SHIELD | Admitting: Hematology

## 2018-06-18 ENCOUNTER — Telehealth: Payer: Self-pay | Admitting: Hematology

## 2018-06-18 ENCOUNTER — Encounter: Payer: Self-pay | Admitting: Hematology

## 2018-06-18 VITALS — BP 147/89 | HR 102 | Temp 98.5°F | Resp 20 | Ht 63.5 in | Wt 184.0 lb

## 2018-06-18 DIAGNOSIS — Z79811 Long term (current) use of aromatase inhibitors: Secondary | ICD-10-CM

## 2018-06-18 DIAGNOSIS — C50411 Malignant neoplasm of upper-outer quadrant of right female breast: Secondary | ICD-10-CM | POA: Insufficient documentation

## 2018-06-18 DIAGNOSIS — F329 Major depressive disorder, single episode, unspecified: Secondary | ICD-10-CM | POA: Diagnosis not present

## 2018-06-18 DIAGNOSIS — I10 Essential (primary) hypertension: Secondary | ICD-10-CM

## 2018-06-18 DIAGNOSIS — E785 Hyperlipidemia, unspecified: Secondary | ICD-10-CM | POA: Insufficient documentation

## 2018-06-18 DIAGNOSIS — Z79899 Other long term (current) drug therapy: Secondary | ICD-10-CM | POA: Insufficient documentation

## 2018-06-18 DIAGNOSIS — Z809 Family history of malignant neoplasm, unspecified: Secondary | ICD-10-CM

## 2018-06-18 DIAGNOSIS — Z923 Personal history of irradiation: Secondary | ICD-10-CM

## 2018-06-18 DIAGNOSIS — F419 Anxiety disorder, unspecified: Secondary | ICD-10-CM | POA: Insufficient documentation

## 2018-06-18 DIAGNOSIS — E039 Hypothyroidism, unspecified: Secondary | ICD-10-CM | POA: Diagnosis not present

## 2018-06-18 DIAGNOSIS — Z791 Long term (current) use of non-steroidal anti-inflammatories (NSAID): Secondary | ICD-10-CM | POA: Diagnosis not present

## 2018-06-18 DIAGNOSIS — Z7984 Long term (current) use of oral hypoglycemic drugs: Secondary | ICD-10-CM | POA: Diagnosis not present

## 2018-06-18 DIAGNOSIS — Z170 Estrogen receptor positive status [ER+]: Secondary | ICD-10-CM

## 2018-06-18 LAB — CBC WITH DIFFERENTIAL (CANCER CENTER ONLY)
Abs Immature Granulocytes: 0.05 10*3/uL (ref 0.00–0.07)
Basophils Absolute: 0.1 10*3/uL (ref 0.0–0.1)
Basophils Relative: 1 %
EOS ABS: 0.3 10*3/uL (ref 0.0–0.5)
Eosinophils Relative: 4 %
HCT: 40.2 % (ref 36.0–46.0)
Hemoglobin: 13.3 g/dL (ref 12.0–15.0)
Immature Granulocytes: 1 %
Lymphocytes Relative: 22 %
Lymphs Abs: 1.8 10*3/uL (ref 0.7–4.0)
MCH: 29 pg (ref 26.0–34.0)
MCHC: 33.1 g/dL (ref 30.0–36.0)
MCV: 87.6 fL (ref 80.0–100.0)
Monocytes Absolute: 0.8 10*3/uL (ref 0.1–1.0)
Monocytes Relative: 9 %
Neutro Abs: 5.2 10*3/uL (ref 1.7–7.7)
Neutrophils Relative %: 63 %
Platelet Count: 355 10*3/uL (ref 150–400)
RBC: 4.59 MIL/uL (ref 3.87–5.11)
RDW: 12.6 % (ref 11.5–15.5)
WBC Count: 8.2 10*3/uL (ref 4.0–10.5)
nRBC: 0 % (ref 0.0–0.2)

## 2018-06-18 LAB — CMP (CANCER CENTER ONLY)
ALT: 25 U/L (ref 0–44)
ANION GAP: 13 (ref 5–15)
AST: 20 U/L (ref 15–41)
Albumin: 4 g/dL (ref 3.5–5.0)
Alkaline Phosphatase: 80 U/L (ref 38–126)
BUN: 7 mg/dL (ref 6–20)
CO2: 20 mmol/L — ABNORMAL LOW (ref 22–32)
Calcium: 9.4 mg/dL (ref 8.9–10.3)
Chloride: 106 mmol/L (ref 98–111)
Creatinine: 0.78 mg/dL (ref 0.44–1.00)
GFR, Est AFR Am: >60 mL/min (ref >60)
GFR, Estimated: >60 mL/min (ref >60)
Glucose, Bld: 154 mg/dL — ABNORMAL HIGH (ref 70–99)
POTASSIUM: 4.2 mmol/L (ref 3.5–5.1)
Sodium: 139 mmol/L (ref 135–145)
Total Bilirubin: 0.5 mg/dL (ref 0.3–1.2)
Total Protein: 6.7 g/dL (ref 6.5–8.1)

## 2018-06-18 NOTE — Telephone Encounter (Signed)
Gave patient avs report and appointments for June.

## 2018-06-24 ENCOUNTER — Encounter: Payer: Self-pay | Admitting: Family Medicine

## 2018-06-24 DIAGNOSIS — E034 Atrophy of thyroid (acquired): Secondary | ICD-10-CM

## 2018-06-25 MED ORDER — LEVOTHYROXINE SODIUM 88 MCG PO TABS: 88.0000 ug | ORAL_TABLET | Freq: Every day | ORAL | 3 refills | Status: DC

## 2018-07-04 ENCOUNTER — Encounter: Payer: Self-pay | Admitting: Family Medicine

## 2018-07-06 IMAGING — MG DIGITAL DIAGNOSTIC UNILATERAL LEFT MAMMOGRAM WITH TOMO AND CAD
4 series · 5 of 12 positions shown · non-contrast
Comparison: Left breast ultrasound July 18, 2017 and multiple prior
mammograms.

CLINICAL DATA: 52-year-old patient presents for routine annual
examination of the left breast and ultrasound follow-up of the right
breast for a probable postoperative seroma. She had a right
lumpectomy in April 2017 for lobular carcinoma. Currently she is
near the end of her radiation treatments and has an appointment with
oncology this week. She tells me she has had fluid drained from a
postoperative seroma, confirmed by reading Dr. Woke most recent
clinic note. The patient tells me today that she had some bruising
in the periareolar upper outer right breast at the time of one of
the percutaneous aspirations of the seroma. While the skin bruising
has resolved, she feels some palpable firmness in the region of
previous bruising. Currently, she has expected skin changes of the
right breast related to radiation therapy. She has no new concerns.

EXAM:
DIGITAL DIAGNOSTIC LEFT MAMMOGRAM WITH CAD AND TOMO
ULTRASOUND RIGHT BREAST

[L CC synth-2D]
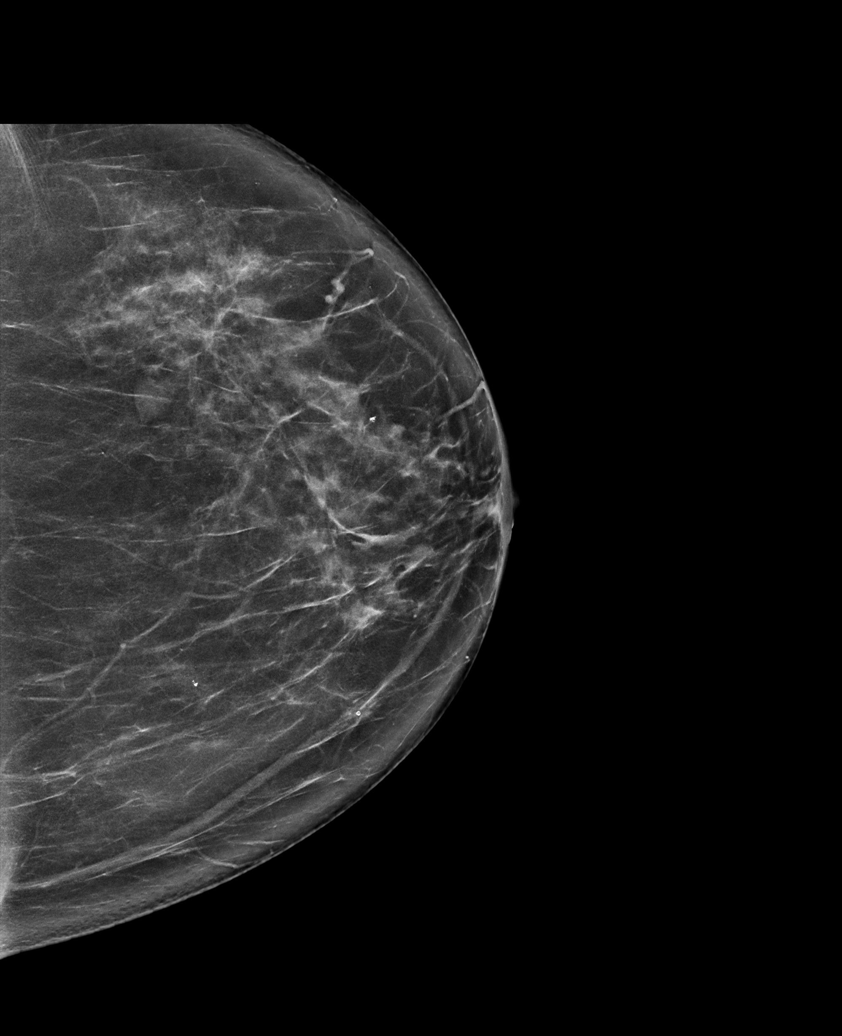

[L MLO synth-2D]
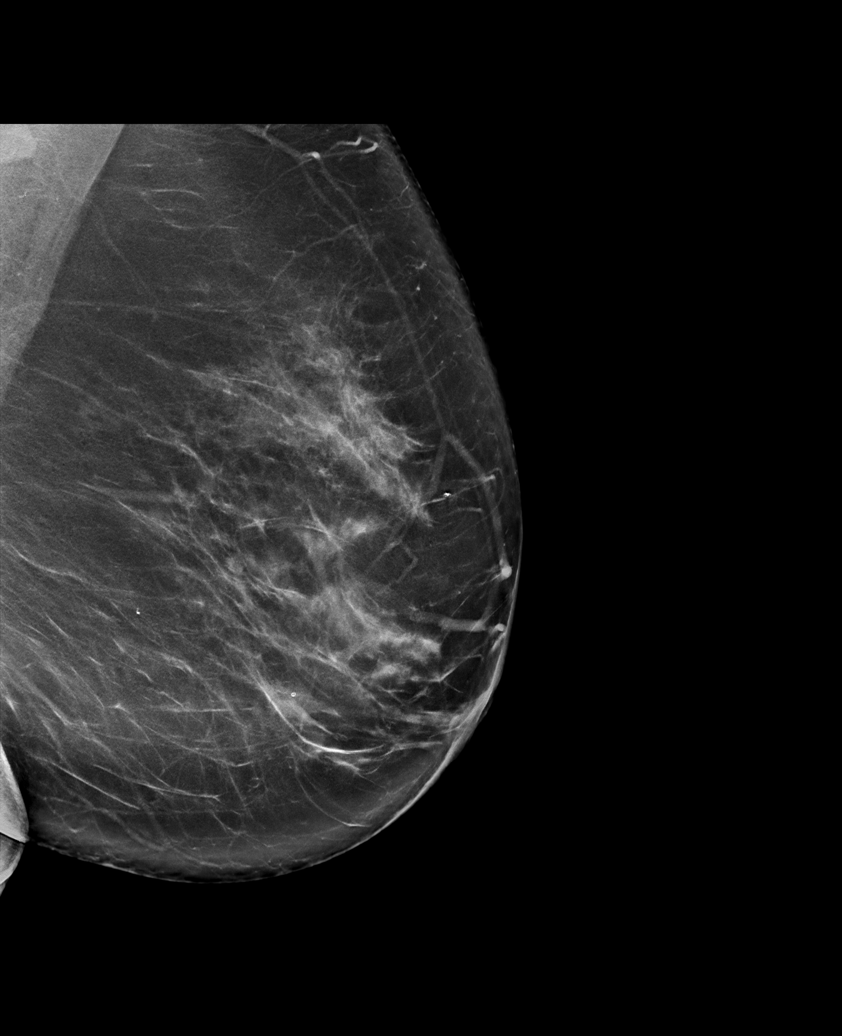

[L CC tomo · 2 of 87 frames shown]
[frame 29/87]
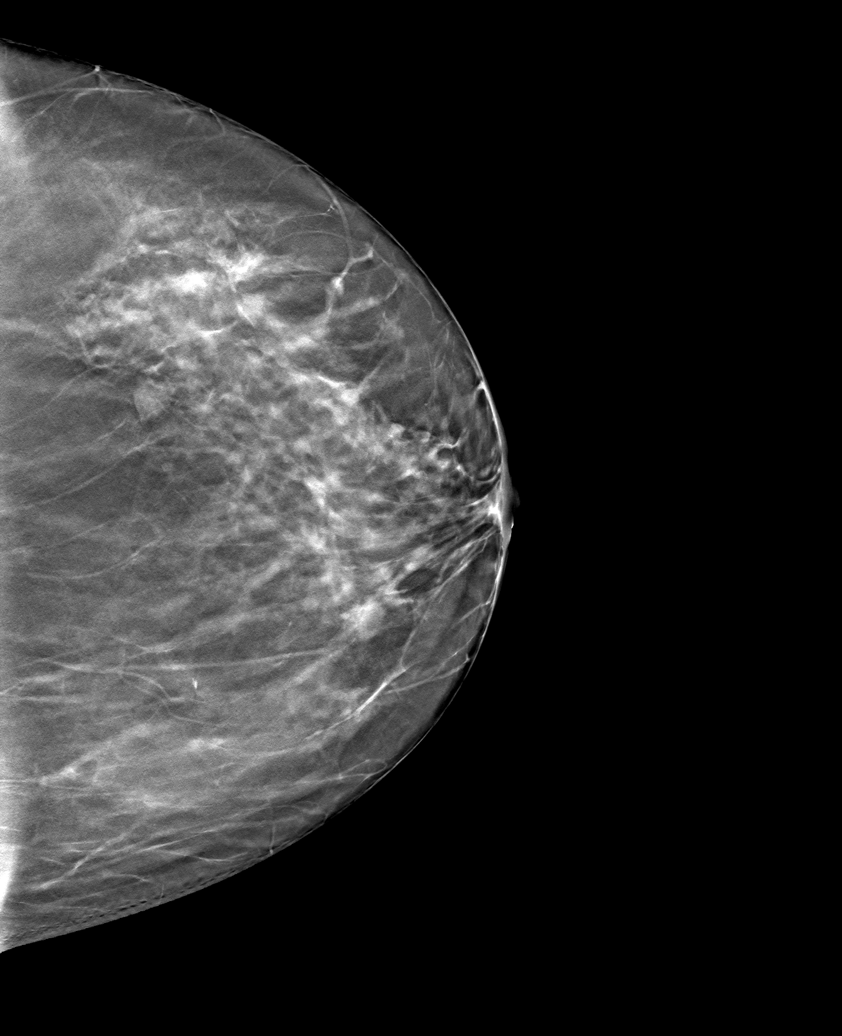
[frame 44/87]
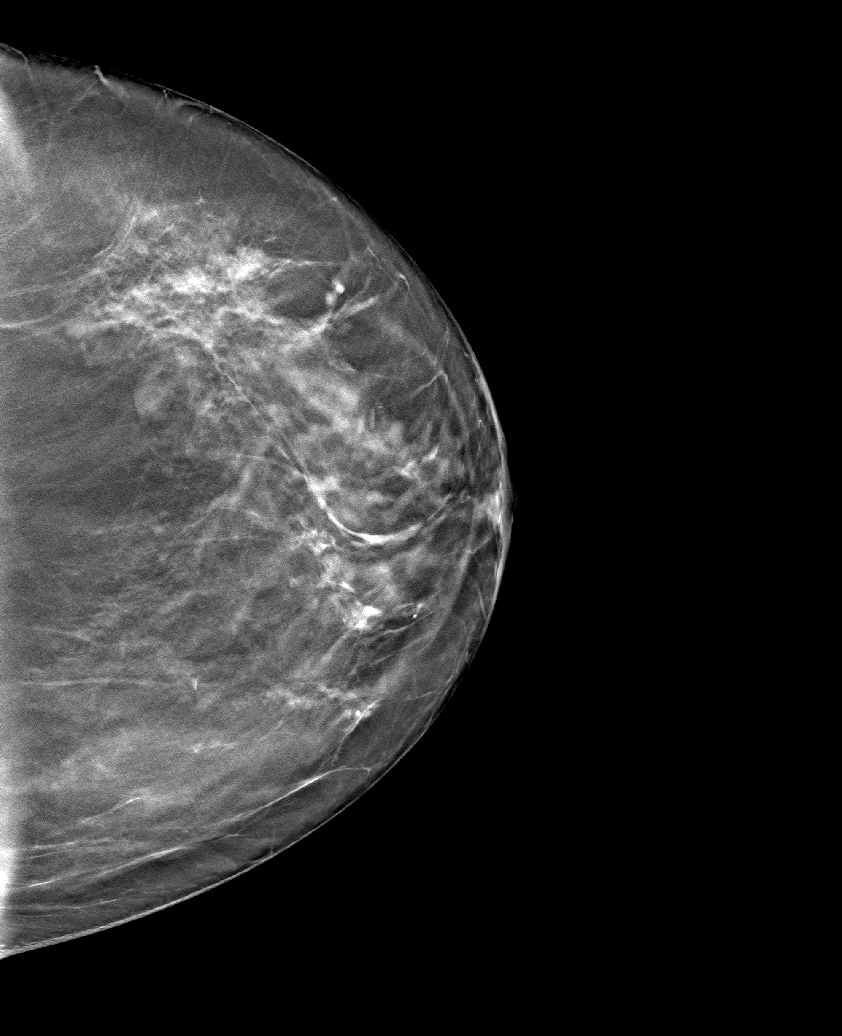

[L MLO tomo · tomo slice 49/96.0]
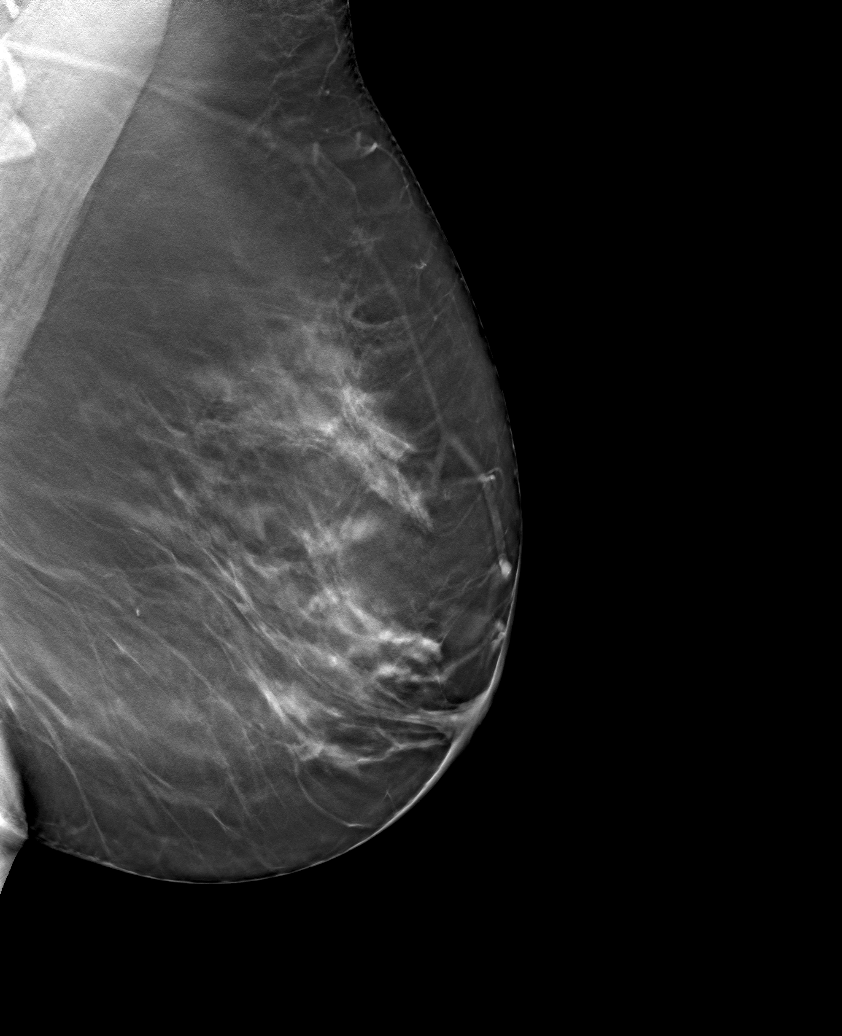

[5 of 12 positions shown; findings below may reference images not displayed]

ACR Breast Density Category b: There are scattered areas of
fibroglandular density.
FINDINGS: There is no mass, architectural distortion, or suspicious
microcalcification in the left breast to suggest malignancy.

Mammographic images were processed with CAD.

On physical exam, there is expected discoloration of the skin of the
entire right breast consistent with current radiation therapy. There
is a healed lumpectomy scar in the upper-outer quadrant of the right
breast. I do palpate some focal thickening in the periareolar right
breast upper-outer quadrant in the region where the patient states
she had cutaneous bruising, now resolved, following a postoperative
aspiration of the seroma in the surgery clinic.

Targeted ultrasound is performed, showing at 10 o'clock position 7
cm from the nipple an oval lobulated simple fluid collection
measuring 4.0 x 1.6 x 3.4 cm. There is no internal nodularity and
there is no associated vascularity or hyperemia. Findings are
consistent with a benign postoperative seroma. There is no edema in
the surrounding tissue.

In the region of previous skin bruising, where the patient feels
some thickening at 11 o'clock position periareolar is some increased
echogenicity within the superficial tissues with scattered small
fluid pockets consistent with a resolving hematoma and/or fat
necrosis. This area measures 3.2 x 1.3 x 1.3 cm.
IMPRESSION: 1. No evidence of malignancy in the left breast.
2. Simple appearing postoperative seroma in the 10 o'clock position
of the right breast measures 4.0 cm greatest diameter.
3. Resolving hematoma and/or fat necrosis at the site of palpable
thickening 11 o'clock position periareolar where the patient states
she had previous skin bruising.

RECOMMENDATION:
Bilateral diagnostic mammogram is recommended in Tuesday July, 2018.

I have discussed the findings and recommendations with the patient.
Results were also provided in writing at the conclusion of the
visit. If applicable, a reminder letter will be sent to the patient
regarding the next appointment.

BI-RADS CATEGORY  2: Benign.

## 2018-07-15 ENCOUNTER — Other Ambulatory Visit: Payer: Self-pay

## 2018-07-15 ENCOUNTER — Encounter: Payer: Self-pay | Admitting: Family Medicine

## 2018-07-15 ENCOUNTER — Other Ambulatory Visit (INDEPENDENT_AMBULATORY_CARE_PROVIDER_SITE_OTHER): Payer: BLUE CROSS/BLUE SHIELD

## 2018-07-15 DIAGNOSIS — R300 Dysuria: Secondary | ICD-10-CM

## 2018-07-15 LAB — POC URINALSYSI DIPSTICK (AUTOMATED)
Bilirubin, UA: NEGATIVE
Blood, UA: NEGATIVE
Glucose, UA: NEGATIVE
Leukocytes, UA: NEGATIVE
Nitrite, UA: NEGATIVE
Protein, UA: POSITIVE — AB
Spec Grav, UA: >=1.03 — AB (ref 1.010–1.025)
Urobilinogen, UA: 0.2 E.U./dL
pH, UA: 5.5 (ref 5.0–8.0)

## 2018-07-15 MED ORDER — NITROFURANTOIN MONOHYD MACRO 100 MG PO CAPS: 100.0000 mg | ORAL_CAPSULE | Freq: Two times a day (BID) | ORAL | 0 refills | Status: DC

## 2018-07-16 LAB — URINE CULTURE
MICRO NUMBER:: 362298
SPECIMEN QUALITY:: ADEQUATE

## 2018-07-17 ENCOUNTER — Encounter: Payer: Self-pay | Admitting: Family Medicine

## 2018-07-26 ENCOUNTER — Encounter: Payer: Self-pay | Admitting: Family Medicine

## 2018-07-26 DIAGNOSIS — F5104 Psychophysiologic insomnia: Secondary | ICD-10-CM

## 2018-07-26 MED ORDER — ZOLPIDEM TARTRATE 5 MG PO TABS: ORAL_TABLET | ORAL | 3 refills | Status: DC

## 2018-08-09 ENCOUNTER — Other Ambulatory Visit: Payer: Self-pay

## 2018-08-09 ENCOUNTER — Ambulatory Visit
Admission: RE | Admit: 2018-08-09 | Discharge: 2018-08-09 | Disposition: A | Discharge status: Home / Self Care | Payer: BLUE CROSS/BLUE SHIELD | Source: Ambulatory Visit | Attending: Hematology | Admitting: Hematology

## 2018-08-09 DIAGNOSIS — Z170 Estrogen receptor positive status [ER+]: Secondary | ICD-10-CM

## 2018-08-09 DIAGNOSIS — C50411 Malignant neoplasm of upper-outer quadrant of right female breast: Secondary | ICD-10-CM

## 2018-09-03 ENCOUNTER — Other Ambulatory Visit: Payer: Self-pay | Admitting: Family Medicine

## 2018-09-03 DIAGNOSIS — B001 Herpesviral vesicular dermatitis: Secondary | ICD-10-CM

## 2018-09-28 ENCOUNTER — Other Ambulatory Visit: Payer: Self-pay | Admitting: Family Medicine

## 2018-09-28 DIAGNOSIS — B001 Herpesviral vesicular dermatitis: Secondary | ICD-10-CM

## 2018-10-05 DIAGNOSIS — B999 Unspecified infectious disease: Secondary | ICD-10-CM | POA: Insufficient documentation

## 2018-10-05 DIAGNOSIS — J45998 Other asthma: Secondary | ICD-10-CM

## 2018-10-05 HISTORY — DX: Other asthma: J45.998

## 2018-10-05 HISTORY — DX: Unspecified infectious disease: B99.9

## 2018-10-14 ENCOUNTER — Telehealth: Payer: Self-pay

## 2018-10-14 NOTE — Telephone Encounter (Signed)
Called and left voicemail regarding pre-screening questions for appt on 6/30

## 2018-10-15 ENCOUNTER — Inpatient Hospital Stay: Payer: BC Managed Care – PPO | Attending: Nurse Practitioner

## 2018-10-15 ENCOUNTER — Inpatient Hospital Stay (HOSPITAL_BASED_OUTPATIENT_CLINIC_OR_DEPARTMENT_OTHER): Payer: BC Managed Care – PPO | Admitting: Nurse Practitioner

## 2018-10-15 ENCOUNTER — Other Ambulatory Visit: Payer: Self-pay | Admitting: Family Medicine

## 2018-10-15 ENCOUNTER — Encounter: Payer: Self-pay | Admitting: Nurse Practitioner

## 2018-10-15 ENCOUNTER — Other Ambulatory Visit: Payer: Self-pay

## 2018-10-15 ENCOUNTER — Telehealth: Payer: Self-pay | Admitting: Nurse Practitioner

## 2018-10-15 VITALS — BP 130/82 | HR 84 | Temp 98.4°F | Resp 18 | Ht 63.5 in | Wt 177.4 lb

## 2018-10-15 DIAGNOSIS — Z170 Estrogen receptor positive status [ER+]: Secondary | ICD-10-CM | POA: Insufficient documentation

## 2018-10-15 DIAGNOSIS — Z79811 Long term (current) use of aromatase inhibitors: Secondary | ICD-10-CM | POA: Insufficient documentation

## 2018-10-15 DIAGNOSIS — C50411 Malignant neoplasm of upper-outer quadrant of right female breast: Secondary | ICD-10-CM | POA: Insufficient documentation

## 2018-10-15 DIAGNOSIS — B001 Herpesviral vesicular dermatitis: Secondary | ICD-10-CM

## 2018-10-15 DIAGNOSIS — E039 Hypothyroidism, unspecified: Secondary | ICD-10-CM | POA: Insufficient documentation

## 2018-10-15 LAB — CMP (CANCER CENTER ONLY)
ALT: 20 U/L (ref 0–44)
AST: 17 U/L (ref 15–41)
Albumin: 4.1 g/dL (ref 3.5–5.0)
Alkaline Phosphatase: 79 U/L (ref 38–126)
Anion gap: 10 (ref 5–15)
BUN: 9 mg/dL (ref 6–20)
CO2: 25 mmol/L (ref 22–32)
Calcium: 9.6 mg/dL (ref 8.9–10.3)
Chloride: 104 mmol/L (ref 98–111)
Creatinine: 0.74 mg/dL (ref 0.44–1.00)
GFR, Est AFR Am: >60 mL/min (ref >60)
GFR, Estimated: >60 mL/min (ref >60)
Glucose, Bld: 105 mg/dL — ABNORMAL HIGH (ref 70–99)
Potassium: 4 mmol/L (ref 3.5–5.1)
Sodium: 139 mmol/L (ref 135–145)
Total Bilirubin: 0.5 mg/dL (ref 0.3–1.2)
Total Protein: 7.1 g/dL (ref 6.5–8.1)

## 2018-10-15 LAB — CBC WITH DIFFERENTIAL (CANCER CENTER ONLY)
Abs Immature Granulocytes: 0.05 10*3/uL (ref 0.00–0.07)
Basophils Absolute: 0.1 10*3/uL (ref 0.0–0.1)
Basophils Relative: 1 %
Eosinophils Absolute: 0.2 10*3/uL (ref 0.0–0.5)
Eosinophils Relative: 2 %
HCT: 41 % (ref 36.0–46.0)
Hemoglobin: 13.4 g/dL (ref 12.0–15.0)
Immature Granulocytes: 1 %
Lymphocytes Relative: 31 %
Lymphs Abs: 2.5 10*3/uL (ref 0.7–4.0)
MCH: 29.3 pg (ref 26.0–34.0)
MCHC: 32.7 g/dL (ref 30.0–36.0)
MCV: 89.5 fL (ref 80.0–100.0)
Monocytes Absolute: 1 10*3/uL (ref 0.1–1.0)
Monocytes Relative: 12 %
Neutro Abs: 4.3 10*3/uL (ref 1.7–7.7)
Neutrophils Relative %: 53 %
Platelet Count: 360 10*3/uL (ref 150–400)
RBC: 4.58 MIL/uL (ref 3.87–5.11)
RDW: 12.3 % (ref 11.5–15.5)
WBC Count: 8 10*3/uL (ref 4.0–10.5)
nRBC: 0 % (ref 0.0–0.2)

## 2018-10-15 MED ORDER — ACYCLOVIR 400 MG PO TABS: 400.0000 mg | ORAL_TABLET | Freq: Three times a day (TID) | ORAL | 1 refills | Status: DC | PRN

## 2018-10-15 NOTE — Progress Notes (Signed)
[place]   [phone]) [phone] [phone]   Clinic Follow up Note   Patient Care Team: Copland, Gay Filler, MD as PCP - General (Family Medicine) [name], MD as Referring Physician (Obstetrics and Gynecology) [name], MD as Consulting Physician (Gastroenterology) [name], MD as Referring Physician (Allergy) [name], MD as Consulting Physician (General Surgery) [name], MD as Consulting Physician (Radiation Oncology) [name], MD as Consulting Physician (Hematology) Gardenia Phlegm, NP as Nurse Practitioner (Hematology and Oncology) 10/15/2018  CHIEF COMPLAINT: f/u right breast cancer   SUMMARY OF ONCOLOGIC HISTORY: Oncology History Overview Note  Cancer Staging Malignant neoplasm of upper-outer quadrant of right breast in female, estrogen receptor positive ([place]) Staging form: Breast, AJCC 8th Edition - Clinical stage from 03/16/2017: Stage IA (cT1b, cN0, cM0, G2, ER: Positive, PR: Positive, HER2: Negative) - Signed by [name], MD on 03/28/2017 - Pathologic stage from 05/15/2017: Stage IA (pT2, pN0, cM0, G2, ER+, PR+, HER2-, Oncotype DX score: 3) - Signed by [name], MD on 08/06/2017     Malignant neoplasm of upper-outer quadrant of right breast in female, estrogen receptor positive ([place])  03/15/2017 Mammogram   Diagnostic Mammogram Right Breast  IMPRESSION: Palpable abnormality corresponds to distortion and focal mass associated acoustic shadowing measuring 0.8x0.9x0.9cm in the 930 o'clock location of the right breast 7 cm from the nipple.   RECOMMENDATION: Ultrasound-guided core biopsy is recommended and scheduled for the patient.   03/16/2017 Initial Biopsy   Biopsy Results  Diagnosis 03/16/17 Breast, right, needle core biopsy, 9:30 o'clock, ribbon clip placed - INVASIVE MAMMARY CARCINOMA. - SEE COMMENT.    03/16/2017 Receptors her2   Estrogen Receptor: 95%, POSITIVE, STRONG STAINING INTENSITY  Progesterone Receptor: 95%, POSITIVE, STRONG STAINING INTENSITY Proliferation Marker Ki67: 3% HER2: NEGATIVE   03/20/2017 Initial Diagnosis   Malignant neoplasm of upper-outer quadrant of right breast in female, estrogen receptor positive ([place])   04/24/2017 Surgery   Right Breast Lumpectomy with Sentinel Node Biopsy with Dr. [name]    04/24/2017 Pathology Results   Diagnosis 04/24/17 1. Breast, lumpectomy, Right w/seed - INVASIVE LOBULAR CARCINOMA, GRADE 2, SPANNING 2.2 CM. - LOBULAR NEOPLASIA (ATYPICAL LOBULAR HYPERPLASIA). - ANTERIOR SUPERIOR MARGIN IS BROADLY POSITIVE. - BIOPSY SITE. - SEE ONCOLOGY TABLE. 2. Breast, excision, Right additional Medial Margin - INVASIVE LOBULAR CARCINOMA. - LOBULAR CARCINOMA IN SITU. - INVASIVE CARCINOMA IS 0.3 CM FROM NEW MEDIAL MARGIN. 3. Lymph node, sentinel, biopsy, Right Axillary #1 - ISOLATED TUMOR CELLS IN ONE OF ONE LYMPH NODES (0/1). 4. Lymph node, sentinel, biopsy, Right Axillary #2 - ONE OF ONE LYMPH NODES NEGATIVE FOR CARCINOMA (0/1). 5. Lymph node, sentinel, biopsy, Right Axillary #3 - ONE OF ONE LYMPH NODES NEGATIVE FOR CARCINOMA (0/1).   04/24/2017 Oncotype testing   Her Oncotyple recurrence score was 3, her distant recurrence score with Tamoxifen was 3% and her adjuvant chemotherapy benefit was <1%.    05/15/2017 Surgery   Re-excision of Right Breast Lumpectomy with Dr. [name]    05/15/2017 Pathology Results   Diagnosis 05/15/17 Breast, excision, Right additional anterior margin - INVASIVE LOBULAR CARCINOMA. - ATYPICAL LOBULAR HYPERPLASIA. - THE NEW ANTERIOR SURGICAL RESECTION MARGIN IS NEGATIVE FOR CARCINOMA.   05/15/2017 Cancer Staging   Staging form: Breast, AJCC 8th Edition - Pathologic stage from 05/15/2017: Stage IA (pT2, pN0, cM0, G2, ER+, PR+, HER2-, Oncotype DX score: 3) - Signed by [name], MD on 08/06/2017   06/26/2017 -  Radiation Therapy   Adjuvant RT with Dr. [name]  07/12/2017 Genetic Testing   Negative genetic  testing on the multi-cancer panel.  The Multi-Gene Panel offered by [name] includes sequencing and/or deletion duplication testing of the following 83 genes: ALK, APC, ATM, AXIN2,BAP1,  BARD1, BLM, BMPR1A, BRCA1, BRCA2, BRIP1, CASR, CDC73, CDH1, CDK4, CDKN1B, CDKN1C, CDKN2A (p14ARF), CDKN2A (p16INK4a), CEBPA, CHEK2, CTNNA1, DICER1, DIS3L2, EGFR (c.2369C>T, p.Thr790Met variant only), EPCAM (Deletion/duplication testing only), FH, FLCN, GATA2, GPC3, GREM1 (Promoter region deletion/duplication testing only), HOXB13 (c.251G>A, p.Gly84Glu), HRAS, KIT, MAX, MEN1, MET, MITF (c.952G>A, p.Glu318Lys variant only), MLH1, MSH2, MSH3, MSH6, MUTYH, NBN, NF1, NF2, NTHL1, PALB2, PDGFRA, PHOX2B, PMS2, POLD1, POLE, POT1, PRKAR1A, PTCH1, PTEN, RAD50, RAD51C, RAD51D, RB1, RECQL4, RET, RUNX1, SDHAF2, SDHA (sequence changes only), SDHB, SDHC, SDHD, SMAD4, SMARCA4, SMARCB1, SMARCE1, STK11, SUFU, TERT, TERT, TMEM127, TP53, TSC1, TSC2, VHL, WRN and WT1.  The report date is July 12, 2017.    08/13/2017 Imaging   08/13/2017 DEXA ASSESSMENT: The BMD measured at AP Spine L1-L4 is 1.120 g/cm2 with a T-score of -0.5. This patient is considered normal according to [facility]) criteria.    08/2017 -  Anti-estrogen oral therapy   Letrozole daily, changed to Exemestane due to poor tolerance of Letrozole   01/16/2018 Imaging   01/16/2018 CT Chest IMPRESSION: Stable 2 mm RIGHT upper lobe nodule.  Post radiation therapy changes at the anterolateral RIGHT upper lobe.  Post RIGHT breast surgery and RIGHT axillary node dissection.  No new abnormalities.  Aortic Atherosclerosis (ICD10-I70.0).   08/09/2018 Mammogram   IMPRESSION: 1. No mammographic evidence of malignancy involving either breast. 2. Expected post lumpectomy and post radiation changes involving the RIGHT breast.     CURRENT THERAPY: adjuvant exemestane starting 09/2017   INTERVAL HISTORY: Erin Fields returns for f/u as scheduled. She was  last seen 06/2018. She had bilateral mammogram 07/2018 that was negative. She feels well today, denies increased fatigue. She attributes weight loss to recent bout of colitis which is improving lately, still has frequent BM. She required steroids and adjustment in her colitis regimen. Denies blood in stool. Appetite is normal otherwise. Denies n/v, pain. She continues exemestane. Denies bone/joint pain. She has mild but tolerable hot flashes that are better overall. Mood is good. She has some mild swelling in her right axilla that she usually manages with exercises, PT on hold for covid19. She saw Dr. [name] recently who will refer back to PT when she's ready. Denies other concerns in the breast such as new lump, nipple discharge or skin change. She notes she "always has" inverted right nipple.   MEDICAL HISTORY:  Past Medical History:  Diagnosis Date  . Allergy   . Anxiety   . Asthma   . Cancer [facility])    right breast cancer  . Chicken pox   . Colon polyps   . Depression   . Elevated blood pressure   . Environmental allergies   . Family history of cancer   . Frequent headaches   . GERD (gastroesophageal reflux disease)   . Hay fever   . Hyperlipidemia   . Hypertension   . Hypothyroidism   . Migraines   . Personal history of radiation therapy   . PONV (postoperative nausea and vomiting)   . Thyroid disease   . Ulcerative colitis ([place])   . Varicose veins     SURGICAL HISTORY: Past Surgical History:  Procedure Laterality Date  . ABLATION  2011  . BREAST BIOPSY Right 02/2017  . BREAST LUMPECTOMY Right 04/2017  . BREAST LUMPECTOMY WITH  RADIOACTIVE SEED AND SENTINEL LYMPH NODE BIOPSY Right 04/24/2017   Procedure: RIGHT BREAST LUMPECTOMY WITH RADIOACTIVE SEED AND SENTINEL LYMPH NODE BIOPSY;  Surgeon: [name], MD;  Location: [place];  Service: General;  Laterality: Right;  . FOOT SURGERY Right 2012  . HAND SURGERY Right   . RE-EXCISION OF BREAST LUMPECTOMY Right  05/15/2017   Procedure: RE-EXCISION OF BREAST LUMPECTOMY;  Surgeon: [name], MD;  Location: [place];  Service: General;  Laterality: Right;  . Septoplasty with turbinate reduction    . TONSILLECTOMY  1980  . VEIN SURGERY     Removal Right Leg  . WISDOM TOOTH EXTRACTION      I have reviewed the social history and family history with the patient and they are unchanged from previous note.  ALLERGIES:  is allergic to ace inhibitors; aspirin; cefaclor; lisinopril; losartan potassium; oxycodone; sulfa antibiotics; and sulfonamide derivatives.  MEDICATIONS:  Current Outpatient Medications  Medication Sig Dispense Refill  . acyclovir (ZOVIRAX) 400 MG tablet TAKE 1 TABLET BY MOUTH THREE TIMES A DAY AS NEEDED 90 tablet 1  . azelastine (ASTELIN) 0.1 % nasal spray 2 sprays 2 (two) times daily.  6  . budesonide-formoterol (SYMBICORT) 160-4.5 MCG/ACT inhaler Inhale 2 puffs into the lungs 2 (two) times daily.    . Diclofenac Sodium (PENNSAID) 2 % SOLN [place] 20 mg/gram/actuation (2 %) topical soln in metered-dose pump  APPLY TWO PUMPS TOPICALLY TO THE AFFECTED AREA TWICE DAILY    . EPINEPHrine 0.3 mg/0.3 mL IJ SOAJ injection epinephrine 0.3 mg/0.3 mL injection, auto-injector  TK UTD PRN    . exemestane (AROMASIN) 25 MG tablet TAKE 1 TABLET (25 MG TOTAL) BY MOUTH DAILY AFTER BREAKFAST. 90 tablet 1  . hyoscyamine (ANASPAZ) 0.125 MG TBDP disintergrating tablet hyoscyamine 0.125 mg disintegrating tablet    . lansoprazole (PREVACID) 30 MG capsule Take 30 mg by mouth daily at 12 noon.    . levalbuterol (XOPENEX HFA) 45 MCG/ACT inhaler Inhale 2 puffs into the lungs every 4 (four) hours as needed for wheezing (Q4-6 Hours PRN).    Marland Kitchen levothyroxine (SYNTHROID, LEVOTHROID) 88 MCG tablet Take 1 tablet (88 mcg total) by mouth daily. 90 tablet 3  . meloxicam (MOBIC) 15 MG tablet Take 0.5-1 tablets (7.5-15 mg total) by mouth daily. 90 tablet 1  . mesalamine (LIALDA) 1.2 G EC tablet Take 2.4 g  by mouth daily.    . metFORMIN (GLUCOPHAGE) 500 MG tablet Take 1 tablet (500 mg total) by mouth daily with breakfast. 90 tablet 3  . montelukast (SINGULAIR) 10 MG tablet Take 10 mg by mouth at bedtime.     Marland Kitchen olmesartan (BENICAR) 40 MG tablet TAKE 1 TABLET BY MOUTH EVERY DAY 90 tablet 1  . promethazine (PHENERGAN) 25 MG tablet TAKE 1/2-1 TABLET DAILY AS NEEDED FOR NAUSEA 30 tablet 0  . rosuvastatin (CRESTOR) 10 MG tablet Take 1 tablet (10 mg total) by mouth daily. 90 tablet 3  . triamcinolone (NASACORT ALLERGY 24HR) 55 MCG/ACT AERO nasal inhaler Place 1 spray into the nose daily.    Marland Kitchen venlafaxine XR (EFFEXOR-XR) 150 MG 24 hr capsule TAKE 1 BY MOUTH EVERY DAY WITH BREAKFAST 90 capsule 1  . zolpidem (AMBIEN) 5 MG tablet TAKE 1 TABLET BY MOUTH AT BEDTIME AS NEEDED FOR SLEEP 30 tablet 3   No current facility-administered medications for this visit.     PHYSICAL EXAMINATION: ECOG PERFORMANCE STATUS: 0 - Asymptomatic  Vitals:   10/15/18 1148  BP: 130/82  Pulse:  84  Resp: 18  Temp: 98.4 F (36.9 C)  SpO2: 97%   Filed Weights   10/15/18 1148  Weight: 177 lb 6.4 oz (80.5 kg)    GENERAL:alert, no distress and comfortable SKIN:  no rash EYES:  sclera clear LUNGS: respirations even and unlabored  HEART: no lower extremity edema ABDOMEN:abdomen round Musculoskeletal:no cyanosis of digits NEURO: alert & oriented x 3 with fluent speech, normal gait Breast exam: s/p right breast lumpectomy. Incision completely healed. Inverted right nipple. No palpable mass in either breast or axilla that I could appreciate. Trace lymphedema to right axilla  LABORATORY DATA:  I have reviewed the data as listed CBC Latest Ref Rng & Units 10/15/2018 06/18/2018 02/07/2018  WBC 4.0 - 10.5 K/uL 8.0 8.2 8.5  Hemoglobin 12.0 - 15.0 g/dL 13.4 13.3 13.0  Hematocrit 36.0 - 46.0 % 41.0 40.2 40.4  Platelets 150 - 400 K/uL 360 355 294     CMP Latest Ref Rng & Units 10/15/2018 06/18/2018 04/08/2018  Glucose 70 - 99  mg/dL 105(H) 154(H) 128(H)  BUN 6 - 20 mg/dL 9 7 8   Creatinine 0.44 - 1.00 mg/dL 0.74 0.78 0.66  Sodium 135 - 145 mmol/L 139 139 141  Potassium 3.5 - 5.1 mmol/L 4.0 4.2 4.5  Chloride 98 - 111 mmol/L 104 106 105  CO2 22 - 32 mmol/L 25 20(L) 26  Calcium 8.9 - 10.3 mg/dL 9.6 9.4 9.7  Total Protein 6.5 - 8.1 g/dL 7.1 6.7 -  Total Bilirubin 0.3 - 1.2 mg/dL 0.5 0.5 -  Alkaline Phos 38 - 126 U/L 79 80 -  AST 15 - 41 U/L 17 20 -  ALT 0 - 44 U/L 20 25 -      RADIOGRAPHIC STUDIES: I have personally reviewed the radiological images as listed and agreed with the findings in the report. No results found.   ASSESSMENT & PLAN: Erin Fields is a 53 y.o. female with history of  1. Malignant neoplasm of upper-outer quadrant of right breast, invasive lobular carcinoma, stage IA, pT2N0M0, ER/PR: Positive, HER2 negative, Grade II -She was initially diagnosed on 03/16/2017, s/p right breast lumpectomy and re-excision for positive margin per Dr. [name] -Due to low Oncotype recurrence score 3, adjuvant therapy was not recommended. -She has completed adjuvant radiation per Dr. [name] 06/26/2017 - 08/09/2017  - She started adjuvent anti-estrogen therapy, Letrozole on 08/2017 but switched to Exemestane due to poor tolerance. Plan to continuefor 7-10 years due to the lobular histology -Ms. Borelli appears well today. She continues to tolerate exemestane well with mild hot flash. 07/2018 mammogram was negative for malignancy. Labs WNL except BG 105. Breast exam today is unremarkable except inverted right nipple which patient notes is her baseline. No clinical concern for recurrence in the breast -continue surveillance -F/u with Dr. [name] in 02/2019 -return for lab, f/u with Dr. Burr Medico in 6 months  2. Genetic Testing - negative  3. Anxiety  -no longer taking valium. Takes effexor-XR for mood which is stable. Wilburn Cornelia for sleep. -F/u with PCP Dr. Edilia Bo   4. Bone Health  -DEXA in 07/2017 was normal, she  takes calcium and vitamin D inconsistently. I also encouraged her to engage in weight bearing exercise.   5. Hypothyroidism, DM, UC, Asthma -F/u PCP, pulm, and GI  PLAN: -Lab, mammogram reviewed -Continue breast cancer surveillance, continue exemestane -annual mammogram in 07/2019 -f/u with Dr. Burr Medico in 6 months   All questions were answered. The patient knows to call the clinic with  any problems, questions or concerns. No barriers to learning was detected.     Alla Feeling, NP 10/15/18

## 2018-10-15 NOTE — Addendum Note (Signed)
Addended by: Wynonia Musty A on: 10/15/2018 04:00 PM   Modules accepted: Orders

## 2018-10-15 NOTE — Telephone Encounter (Signed)
Scheduled appt per 6/30 los. Left a voice message of appt date and time,

## 2018-10-17 ENCOUNTER — Other Ambulatory Visit: Payer: Self-pay | Admitting: Family Medicine

## 2018-11-22 ENCOUNTER — Other Ambulatory Visit: Payer: Self-pay | Admitting: Family Medicine

## 2018-11-22 DIAGNOSIS — F5104 Psychophysiologic insomnia: Secondary | ICD-10-CM

## 2018-11-23 ENCOUNTER — Encounter: Payer: Self-pay | Admitting: Family Medicine

## 2018-11-25 ENCOUNTER — Telehealth: Payer: Self-pay | Admitting: Family Medicine

## 2018-11-25 NOTE — Telephone Encounter (Signed)
Per request pt needs follow up appt LVM to call

## 2018-11-26 ENCOUNTER — Other Ambulatory Visit: Payer: Self-pay

## 2018-11-29 NOTE — Progress Notes (Addendum)
[facility] [address], [place], [place] [address] [phone]  Date:  12/02/2018   Name:  Erin Fields   DOB:  09/01/1965   MRN:  [id number]  PCP:  Darreld Mclean, MD    Chief Complaint: Hypothyroidism (6 month follow up)   History of Present Illness:  Erin Fields is a 53 y.o. very pleasant female patient who presents with the following:  6 month follow-up visit today- last seen by myself in December  History of DM, breast cancer dx 11/18, UC, hypothyroidism Her GI care is with with [facility]- last saw them in July. She had a colonoscopy in July and will follow-up in 3 years due to her ulcerative colitis  Followed up with oncology in June as well- they plan for 7- 10 years of anti estrogen therapy, she is taking exemestane (aromasin) currently - she is tolerating well so far   Her husband died in an accident 06-Jul-2016, her daughter died of overdose in 07-07-2015.  Her son has been doing ok- he is doing remote learning right now, starting [employer] remotely this fall  Foot exam due Eye exam: she is due, will call  Pap:  Per GYN- Dr Deatra Ina.  Done in July 06, 2017 mammo done in 4/20 Labs are due today- needs a1c, tsh, lipids - she is fasting today   She has low IgG per [facility] this April- was 526.  They were hoping this will continue to increase (if goes down she will need replacement) She wonders if we can check this level for her today   Achilles Dunk at [facility] has been following her immune function for her.  I will contact her with IgG and also ask if ok to do routine immun such as tetanus and shingrix   BP Readings from Last 3 Encounters:  12/02/18 134/82  10/15/18 130/82  06/18/18 (!) 147/89   Wt Readings from Last 3 Encounters:  12/02/18 177 lb (80.3 kg)  10/15/18 177 lb 6.4 oz (80.5 kg)  06/18/18 184 lb (83.5 kg)   She has lost a few lbs, she is trying to do so  Colitis sx also have caused some weight loss    Patient Active  Problem List   Diagnosis Date Noted  . Anxiety and depression 01/20/2018  . Right upper lobe pulmonary nodule 01/10/2018  . Nasal polyp, unspecified 01/10/2018  . Left sided abdominal pain 12/13/2017  . Impaired fasting glucose 12/12/2017  . History of adenomatous polyp of colon 11/19/2017  . Genetic testing 07/16/2017  . Family history of cancer   . Controlled type 2 diabetes mellitus without complication, without long-term current use of insulin ([place]) 05/28/2017  . Malignant neoplasm of upper-outer quadrant of right breast in female, estrogen receptor positive ([place]) 03/20/2017  . Crohn disease ([place]) 11/29/2016  . Borderline diabetes 12/17/2015  . Ex-smoker 11/18/2015  . Chronic venous insufficiency 09/02/2015  . Symptomatic varicose veins, right 09/02/2015  . Eustachian tube dysfunction 12/04/2014  . Allergic rhinitis 09/15/2014  . Dyspnea 09/15/2014  . Liver hemangioma 08/24/2014  . Elevated triglycerides with high cholesterol 11/26/2013  . Insomnia 08/20/2013  . Hypertension 08/20/2013  . Ulcerative colitis ([place]) 04/20/2013  . Oral herpes 04/20/2013  . Hyperlipidemia 12/06/2006  . HEADACHE 12/06/2006  . CERVICAL CANCER, HX OF 12/06/2006  . Hypothyroidism 11/29/2006  . Asthma 11/29/2006  . IRRITABLE BOWEL SYNDROME 11/29/2006    Past Medical History:  Diagnosis Date  . Allergy   .  Anxiety   . Asthma   . Cancer [facility])    right breast cancer  . Chicken pox   . Colon polyps   . Depression   . Elevated blood pressure   . Environmental allergies   . Family history of cancer   . Frequent headaches   . GERD (gastroesophageal reflux disease)   . Hay fever   . Hyperlipidemia   . Hypertension   . Hypothyroidism   . Migraines   . Personal history of radiation therapy   . PONV (postoperative nausea and vomiting)   . Thyroid disease   . Ulcerative colitis ([place])   . Varicose veins     Past Surgical History:  Procedure Laterality Date  . ABLATION  2011  . BREAST  BIOPSY Right 02/2017  . BREAST LUMPECTOMY Right 04/2017  . BREAST LUMPECTOMY WITH RADIOACTIVE SEED AND SENTINEL LYMPH NODE BIOPSY Right 04/24/2017   Procedure: RIGHT BREAST LUMPECTOMY WITH RADIOACTIVE SEED AND SENTINEL LYMPH NODE BIOPSY;  Surgeon: [name], MD;  Location: [place];  Service: General;  Laterality: Right;  . FOOT SURGERY Right 2012  . HAND SURGERY Right   . RE-EXCISION OF BREAST LUMPECTOMY Right 05/15/2017   Procedure: RE-EXCISION OF BREAST LUMPECTOMY;  Surgeon: [name], MD;  Location: [place];  Service: General;  Laterality: Right;  . Septoplasty with turbinate reduction    . TONSILLECTOMY  1980  . VEIN SURGERY     Removal Right Leg  . WISDOM TOOTH EXTRACTION      Social History   Tobacco Use  . Smoking status: Former Smoker    Packs/day: 4.00    Years: 15.00    Pack years: 60.00    Types: Cigarettes    Quit date: 09/14/1993    Years since quitting: 25.2  . Smokeless tobacco: Never Used  . Tobacco comment: Quit >20 yrs ago  Substance Use Topics  . Alcohol use: Yes    Comment: Drinks a couple every 2-3 months  . Drug use: No    Family History  Problem Relation Age of Onset  . Arthritis Mother 96       Deceased  . Breast cancer Mother   . Lung cancer Mother   . Hyperlipidemia Mother   . Heart disease Mother   . Hypertension Mother   . Diabetes Mother   . Crohn's disease Mother   . Diabetes Maternal Grandfather   . Heart disease Maternal Grandfather   . Heart attack Maternal Grandfather   . Leukemia Maternal Grandfather   . Colitis Sister   . Leukemia Cousin 3       mat cousin    Allergies  Allergen Reactions  . Ace Inhibitors Other (See Comments)    Angina  . Aspirin   . Cefaclor   . Lisinopril Cough  . Losartan Potassium   . Oxycodone Hives  . Sulfa Antibiotics Other (See Comments)  . Sulfonamide Derivatives     Medication list has been reviewed and updated.  Current Outpatient Medications on  File Prior to Visit  Medication Sig Dispense Refill  . acyclovir (ZOVIRAX) 400 MG tablet Take 1 tablet (400 mg total) by mouth 3 (three) times daily as needed. 270 tablet 1  . azelastine (ASTELIN) 0.1 % nasal spray 2 sprays 2 (two) times daily.  6  . budesonide-formoterol (SYMBICORT) 160-4.5 MCG/ACT inhaler Inhale 2 puffs into the lungs 2 (two) times daily.    . Diclofenac Sodium (PENNSAID) 2 % SOLN Pennsaid 20 mg/gram/actuation (  2 %) topical soln in metered-dose pump  APPLY TWO PUMPS TOPICALLY TO THE AFFECTED AREA TWICE DAILY    . EPINEPHrine 0.3 mg/0.3 mL IJ SOAJ injection epinephrine 0.3 mg/0.3 mL injection, auto-injector  TK UTD PRN    . hyoscyamine (ANASPAZ) 0.125 MG TBDP disintergrating tablet hyoscyamine 0.125 mg disintegrating tablet    . lansoprazole (PREVACID) 30 MG capsule Take 30 mg by mouth daily at 12 noon.    . levalbuterol (XOPENEX HFA) 45 MCG/ACT inhaler Inhale 2 puffs into the lungs every 4 (four) hours as needed for wheezing (Q4-6 Hours PRN).    Marland Kitchen levothyroxine (SYNTHROID, LEVOTHROID) 88 MCG tablet Take 1 tablet (88 mcg total) by mouth daily. 90 tablet 3  . meloxicam (MOBIC) 15 MG tablet Take 0.5-1 tablets (7.5-15 mg total) by mouth daily. 90 tablet 1  . mesalamine (LIALDA) 1.2 G EC tablet Take 2.4 g by mouth daily.    . metFORMIN (GLUCOPHAGE) 500 MG tablet Take 1 tablet (500 mg total) by mouth daily with breakfast. 90 tablet 3  . montelukast (SINGULAIR) 10 MG tablet Take 10 mg by mouth at bedtime.     Marland Kitchen olmesartan (BENICAR) 40 MG tablet TAKE 1 TABLET BY MOUTH EVERY DAY 90 tablet 1  . promethazine (PHENERGAN) 25 MG tablet TAKE 1/2-1 TABLET DAILY AS NEEDED FOR NAUSEA 30 tablet 0  . triamcinolone (NASACORT ALLERGY 24HR) 55 MCG/ACT AERO nasal inhaler Place 1 spray into the nose daily.    Marland Kitchen venlafaxine XR (EFFEXOR-XR) 150 MG 24 hr capsule TAKE 1 BY MOUTH EVERY DAY WITH BREAKFAST 90 capsule 1  . zolpidem (AMBIEN) 5 MG tablet TAKE 1 TABLET BY MOUTH EVERY DAY AT BEDTIME AS NEEDED FOR  SLEEP 30 tablet 0  . exemestane (AROMASIN) 25 MG tablet TAKE 1 TABLET (25 MG TOTAL) BY MOUTH DAILY AFTER BREAKFAST. 90 tablet 1   No current facility-administered medications on file prior to visit.     Review of Systems:  As per HPI- otherwise negative. No fever or chills No CP or SOB   Physical Examination: Vitals:   12/02/18 0928  BP: 134/82  Pulse: 98  Resp: 16  Temp: (!) 97.5 F (36.4 C)  SpO2: 100%   Vitals:   12/02/18 0928  Weight: 177 lb (80.3 kg)  Height: 5' 3.5" (1.613 m)   Body mass index is 30.86 kg/m. Ideal Body Weight: Weight in (lb) to have BMI = 25: 143.1  GEN: WDWN, NAD, Non-toxic, A & O x 3, overweight, looks well HEENT: Atraumatic, Normocephalic. Neck supple. No masses, No LAD. Ears and Nose: No external deformity. CV: RRR, No M/G/R. No JVD. No thrill. No extra heart sounds. PULM: CTA B, no wheezes, crackles, rhonchi. No retractions. No resp. distress. No accessory muscle use. ABD: S, NT, ND, +BS. No rebound. No HSM. EXTR: No c/c/e NEURO Normal gait.  PSYCH: Normally interactive. Conversant. Not depressed or anxious appearing.  Calm demeanor.  Normal foot exam today   Assessment and Plan:   ICD-10-CM   1. Controlled type 2 diabetes mellitus without complication, without long-term current use of insulin (HCC)  E11.9 Hemoglobin A1c  2. Hypothyroidism due to acquired atrophy of thyroid  E03.4 TSH  3. Mixed hyperlipidemia  E78.2 Lipid panel  4. Essential hypertension  I10   5. 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 Immunization due  Z23 CANCELED: Td vaccine  greater than or equal to 7yo preservative free IM   Follow-up visit today Check A1c Hold off on shots until I get her IgG and discuss with immunologist at [facility]  See patient instructions for more details.     Follow-up: No follow-ups on file.  No orders of the defined  types were placed in this encounter.  Orders Placed This Encounter  Procedures  . Hemoglobin A1c  . Lipid panel  . TSH  . IgG  . LDL cholesterol, direct    @SIGN @    Signed Lamar Blinks, MD  Received her labs so far, 8/18-message to patient  Results for orders placed or performed in visit on 12/02/18  Hemoglobin A1c  Result Value Ref Range   Hgb A1c MFr Bld 7.0 (H) 4.6 - 6.5 %  Lipid panel  Result Value Ref Range   Cholesterol 165 0 - 200 mg/dL   Triglycerides 235.0 (H) 0.0 - 149.0 mg/dL   HDL 37.40 (L) >39.00 mg/dL   VLDL 47.0 (H) 0.0 - 40.0 mg/dL   Total CHOL/HDL Ratio 4    NonHDL 128.03   TSH  Result Value Ref Range   TSH 1.06 0.35 - 4.50 uIU/mL  IgG  Result Value Ref Range   IgG (Immunoglobin G), Serum 488 (L) 600 - 1,640 mg/dL  LDL cholesterol, direct  Result Value Ref Range   Direct LDL 101.0 mg/dL   Continue current dose of Synthroid Metformin Crestor  Received her IgG, 8/19 Contacted Dr. Achilles Dunk at [facility] about this issue-[facility] with [facility] [place] I left a detailed message.  Will fax over IgG level for Dr. Talmage Nap.  If she would please review it.  Would also ask her if the patient may have Shingrix.  Left my cell number for them to call back It looks like her previous IgG was 492 Will fax more recent IgG to [facility]

## 2018-12-01 ENCOUNTER — Other Ambulatory Visit: Payer: Self-pay | Admitting: Hematology

## 2018-12-01 DIAGNOSIS — Z170 Estrogen receptor positive status [ER+]: Secondary | ICD-10-CM

## 2018-12-01 DIAGNOSIS — C50411 Malignant neoplasm of upper-outer quadrant of right female breast: Secondary | ICD-10-CM

## 2018-12-02 ENCOUNTER — Ambulatory Visit: Payer: BC Managed Care – PPO | Admitting: Family Medicine

## 2018-12-02 ENCOUNTER — Encounter: Payer: Self-pay | Admitting: Family Medicine

## 2018-12-02 ENCOUNTER — Other Ambulatory Visit: Payer: Self-pay

## 2018-12-02 VITALS — BP 134/82 | HR 98 | Temp 97.5°F | Resp 16 | Ht 63.5 in | Wt 177.0 lb

## 2018-12-02 DIAGNOSIS — E119 Type 2 diabetes mellitus without complications: Secondary | ICD-10-CM | POA: Diagnosis not present

## 2018-12-02 DIAGNOSIS — E782 Mixed hyperlipidemia: Secondary | ICD-10-CM

## 2018-12-02 DIAGNOSIS — D803 Selective deficiency of immunoglobulin G [IgG] subclasses: Secondary | ICD-10-CM

## 2018-12-02 DIAGNOSIS — Z23 Encounter for immunization: Secondary | ICD-10-CM

## 2018-12-02 DIAGNOSIS — E034 Atrophy of thyroid (acquired): Secondary | ICD-10-CM | POA: Diagnosis not present

## 2018-12-02 DIAGNOSIS — C50411 Malignant neoplasm of upper-outer quadrant of right female breast: Secondary | ICD-10-CM

## 2018-12-02 DIAGNOSIS — I10 Essential (primary) hypertension: Secondary | ICD-10-CM

## 2018-12-02 DIAGNOSIS — Z170 Estrogen receptor positive status [ER+]: Secondary | ICD-10-CM

## 2018-12-02 LAB — LIPID PANEL
Cholesterol: 165 mg/dL (ref 0–200)
HDL: 37.4 mg/dL — ABNORMAL LOW (ref >39.00)
NonHDL: 128.03
Total CHOL/HDL Ratio: 4
Triglycerides: 235 mg/dL — ABNORMAL HIGH (ref 0.0–149.0)
VLDL: 47 mg/dL — ABNORMAL HIGH (ref 0.0–40.0)

## 2018-12-02 LAB — TSH: TSH: 1.06 u[IU]/mL (ref 0.35–4.50)

## 2018-12-02 LAB — HEMOGLOBIN A1C: Hgb A1c MFr Bld: 7 % — ABNORMAL HIGH (ref 4.6–6.5)

## 2018-12-02 LAB — LDL CHOLESTEROL, DIRECT: Direct LDL: 101 mg/dL

## 2018-12-02 NOTE — Patient Instructions (Addendum)
Good to see you today- I will be in touch with your labs  Let me check on your IgG levels today and then discuss your immunizations with Dr. Lloyd Huger at [facility] work with your weight- keep it up  Please schedule your eye exam when you can

## 2018-12-03 ENCOUNTER — Encounter: Payer: Self-pay | Admitting: Family Medicine

## 2018-12-03 LAB — IGG: IgG (Immunoglobin G), Serum: 488 mg/dL — ABNORMAL LOW (ref 600–1640)

## 2018-12-03 MED ORDER — ROSUVASTATIN CALCIUM 20 MG PO TABS: 20.0000 mg | ORAL_TABLET | Freq: Every day | ORAL | 3 refills | Status: DC

## 2018-12-04 ENCOUNTER — Encounter: Payer: Self-pay | Admitting: Family Medicine

## 2018-12-10 ENCOUNTER — Encounter: Payer: Self-pay | Admitting: Family Medicine

## 2018-12-10 ENCOUNTER — Other Ambulatory Visit: Payer: Self-pay | Admitting: Family Medicine

## 2018-12-10 DIAGNOSIS — F430 Acute stress reaction: Secondary | ICD-10-CM

## 2018-12-18 ENCOUNTER — Other Ambulatory Visit: Payer: Self-pay

## 2018-12-18 ENCOUNTER — Encounter: Payer: Self-pay | Admitting: Family Medicine

## 2018-12-18 ENCOUNTER — Ambulatory Visit (INDEPENDENT_AMBULATORY_CARE_PROVIDER_SITE_OTHER): Payer: BC Managed Care – PPO

## 2018-12-18 DIAGNOSIS — Z23 Encounter for immunization: Secondary | ICD-10-CM | POA: Diagnosis not present

## 2018-12-18 MED ORDER — ZOSTER VAC RECOMB ADJUVANTED 50 MCG/0.5ML IM SUSR: 0.5000 mL | Freq: Once | INTRAMUSCULAR | 1 refills | Status: AC

## 2018-12-18 NOTE — Progress Notes (Signed)
Patient came in today to have her Flu and TD injection per Dr. Lorelei Pont. Patient was also given RX for shingrix vaccine. She tolerated the Flu and TD in her left arm with no complications. Patient can only use her left arm for injections.

## 2018-12-23 ENCOUNTER — Other Ambulatory Visit: Payer: Self-pay | Admitting: Family Medicine

## 2018-12-23 DIAGNOSIS — F5104 Psychophysiologic insomnia: Secondary | ICD-10-CM

## 2018-12-24 ENCOUNTER — Encounter: Payer: Self-pay | Admitting: Family Medicine

## 2018-12-24 DIAGNOSIS — F5104 Psychophysiologic insomnia: Secondary | ICD-10-CM

## 2018-12-25 ENCOUNTER — Encounter: Payer: Self-pay | Admitting: Family Medicine

## 2018-12-25 MED ORDER — ZOLPIDEM TARTRATE 5 MG PO TABS: ORAL_TABLET | ORAL | 3 refills | Status: DC

## 2018-12-31 ENCOUNTER — Other Ambulatory Visit: Payer: Self-pay

## 2018-12-31 DIAGNOSIS — Z20822 Contact with and (suspected) exposure to covid-19: Secondary | ICD-10-CM

## 2019-01-02 LAB — NOVEL CORONAVIRUS, NAA: SARS-CoV-2, NAA: NOT DETECTED

## 2019-01-08 ENCOUNTER — Other Ambulatory Visit: Payer: Self-pay

## 2019-01-08 DIAGNOSIS — Z20822 Contact with and (suspected) exposure to covid-19: Secondary | ICD-10-CM

## 2019-01-10 LAB — NOVEL CORONAVIRUS, NAA: SARS-CoV-2, NAA: NOT DETECTED

## 2019-01-24 ENCOUNTER — Encounter: Payer: Self-pay | Admitting: Family Medicine

## 2019-01-24 MED ORDER — PROMETHAZINE HCL 25 MG PO TABS: ORAL_TABLET | ORAL | 5 refills | Status: DC

## 2019-03-11 ENCOUNTER — Other Ambulatory Visit: Payer: Self-pay | Admitting: Family Medicine

## 2019-03-19 ENCOUNTER — Other Ambulatory Visit: Payer: Self-pay | Admitting: General Surgery

## 2019-04-02 ENCOUNTER — Other Ambulatory Visit: Payer: Self-pay | Admitting: Family Medicine

## 2019-04-04 ENCOUNTER — Other Ambulatory Visit: Payer: Self-pay | Admitting: Family Medicine

## 2019-04-04 DIAGNOSIS — B001 Herpesviral vesicular dermatitis: Secondary | ICD-10-CM

## 2019-04-07 ENCOUNTER — Ambulatory Visit: Payer: BC Managed Care – PPO | Attending: Internal Medicine

## 2019-04-07 ENCOUNTER — Other Ambulatory Visit: Payer: BC Managed Care – PPO

## 2019-04-07 DIAGNOSIS — Z20822 Contact with and (suspected) exposure to covid-19: Secondary | ICD-10-CM

## 2019-04-08 ENCOUNTER — Other Ambulatory Visit (HOSPITAL_COMMUNITY): Payer: Self-pay | Admitting: Obstetrics & Gynecology

## 2019-04-08 ENCOUNTER — Other Ambulatory Visit: Payer: Self-pay | Admitting: Obstetrics & Gynecology

## 2019-04-08 DIAGNOSIS — Z853 Personal history of malignant neoplasm of breast: Secondary | ICD-10-CM

## 2019-04-08 LAB — NOVEL CORONAVIRUS, NAA: SARS-CoV-2, NAA: NOT DETECTED

## 2019-04-15 NOTE — Progress Notes (Signed)
[place]   Telephone:([phone]) [phone] Fax:(336) [phone]   Clinic Follow up Note   Patient Care Team: Copland, Gay Filler, MD as PCP - General (Family Medicine) [name], MD as Referring Physician (Obstetrics and Gynecology) [name], MD as Consulting Physician (Gastroenterology) [name], MD as Referring Physician (Allergy) [name], MD as Consulting Physician (General Surgery) [name], MD as Consulting Physician (Radiation Oncology) [name], MD as Consulting Physician (Hematology) Delice Bison Charlestine Massed, NP as Nurse Practitioner (Hematology and Oncology)  I connected with Erin Fields on 04/16/2019 at  1:20 PM EST by video enabled telemedicine visit and verified that I am speaking with the correct person using two identifiers.  I discussed the limitations, risks, security and privacy concerns of performing an evaluation and management service by telephone and the availability of in person appointments. I also discussed with the patient that there may be a patient responsible charge related to this service. The patient expressed understanding and agreed to proceed.   Other persons participating in the visit and their role in the encounter:  None   Patient's location:  Home  Provider's location:  Home   CHIEF COMPLAINT: F/u of right breast cancer   SUMMARY OF ONCOLOGIC HISTORY: Oncology History Overview Note  Cancer Staging Malignant neoplasm of upper-outer quadrant of right breast in female, estrogen receptor positive ([place]) Staging form: Breast, AJCC 8th Edition - Clinical stage from 03/16/2017: Stage IA (cT1b, cN0, cM0, G2, ER: Positive, PR: Positive, HER2: Negative) - Signed by [name], MD on 03/28/2017 - Pathologic stage from 05/15/2017: Stage IA (pT2, pN0, cM0, G2, ER+, PR+, HER2-, Oncotype DX score: 3) - Signed by [name], MD on 08/06/2017     Malignant neoplasm of upper-outer quadrant of right breast in female, estrogen  receptor positive ([place])  03/15/2017 Mammogram   Diagnostic Mammogram Right Breast  IMPRESSION: Palpable abnormality corresponds to distortion and focal mass associated acoustic shadowing measuring 0.8x0.9x0.9cm in the 930 o'clock location of the right breast 7 cm from the nipple.   RECOMMENDATION: Ultrasound-guided core biopsy is recommended and scheduled for the patient.   03/16/2017 Initial Biopsy   Biopsy Results  Diagnosis 03/16/17 Breast, right, needle core biopsy, 9:30 o'clock, ribbon clip placed - INVASIVE MAMMARY CARCINOMA. - SEE COMMENT.    03/16/2017 Receptors her2   Estrogen Receptor: 95%, POSITIVE, STRONG STAINING INTENSITY Progesterone Receptor: 95%, POSITIVE, STRONG STAINING INTENSITY Proliferation Marker Ki67: 3% HER2: NEGATIVE   03/20/2017 Initial Diagnosis   Malignant neoplasm of upper-outer quadrant of right breast in female, estrogen receptor positive ([place])   04/24/2017 Surgery   Right Breast Lumpectomy with Sentinel Node Biopsy with Dr. [name]    04/24/2017 Pathology Results   Diagnosis 04/24/17 1. Breast, lumpectomy, Right w/seed - INVASIVE LOBULAR CARCINOMA, GRADE 2, SPANNING 2.2 CM. - LOBULAR NEOPLASIA (ATYPICAL LOBULAR HYPERPLASIA). - ANTERIOR SUPERIOR MARGIN IS BROADLY POSITIVE. - BIOPSY SITE. - SEE ONCOLOGY TABLE. 2. Breast, excision, Right additional Medial Margin - INVASIVE LOBULAR CARCINOMA. - LOBULAR CARCINOMA IN SITU. - INVASIVE CARCINOMA IS 0.3 CM FROM NEW MEDIAL MARGIN. 3. Lymph node, sentinel, biopsy, Right Axillary #1 - ISOLATED TUMOR CELLS IN ONE OF ONE LYMPH NODES (0/1). 4. Lymph node, sentinel, biopsy, Right Axillary #2 - ONE OF ONE LYMPH NODES NEGATIVE FOR CARCINOMA (0/1). 5. Lymph node, sentinel, biopsy, Right Axillary #3 - ONE OF ONE LYMPH NODES NEGATIVE FOR CARCINOMA (0/1).   04/24/2017 Oncotype testing   Her Oncotyple recurrence score was 3, her distant recurrence score with Tamoxifen was  3% and her adjuvant chemotherapy benefit  was <1%.    05/15/2017 Surgery   Re-excision of Right Breast Lumpectomy with Dr. [name]    05/15/2017 Pathology Results   Diagnosis 05/15/17 Breast, excision, Right additional anterior margin - INVASIVE LOBULAR CARCINOMA. - ATYPICAL LOBULAR HYPERPLASIA. - THE NEW ANTERIOR SURGICAL RESECTION MARGIN IS NEGATIVE FOR CARCINOMA.   05/15/2017 Cancer Staging   Staging form: Breast, AJCC 8th Edition - Pathologic stage from 05/15/2017: Stage IA (pT2, pN0, cM0, G2, ER+, PR+, HER2-, Oncotype DX score: 3) - Signed by [name], MD on 08/06/2017   06/26/2017 -  Radiation Therapy   Adjuvant RT with Dr. [name]   07/12/2017 Genetic Testing   Negative genetic testing on the multi-cancer panel.  The Multi-Gene Panel offered by [name] includes sequencing and/or deletion duplication testing of the following 83 genes: ALK, APC, ATM, AXIN2,BAP1,  BARD1, BLM, BMPR1A, BRCA1, BRCA2, BRIP1, CASR, CDC73, CDH1, CDK4, CDKN1B, CDKN1C, CDKN2A (p14ARF), CDKN2A (p16INK4a), CEBPA, CHEK2, CTNNA1, DICER1, DIS3L2, EGFR (c.2369C>T, p.Thr790Met variant only), EPCAM (Deletion/duplication testing only), FH, FLCN, GATA2, GPC3, GREM1 (Promoter region deletion/duplication testing only), HOXB13 (c.251G>A, p.Gly84Glu), HRAS, KIT, MAX, MEN1, MET, MITF (c.952G>A, p.Glu318Lys variant only), MLH1, MSH2, MSH3, MSH6, MUTYH, NBN, NF1, NF2, NTHL1, PALB2, PDGFRA, PHOX2B, PMS2, POLD1, POLE, POT1, PRKAR1A, PTCH1, PTEN, RAD50, RAD51C, RAD51D, RB1, RECQL4, RET, RUNX1, SDHAF2, SDHA (sequence changes only), SDHB, SDHC, SDHD, SMAD4, SMARCA4, SMARCB1, SMARCE1, STK11, SUFU, TERT, TERT, TMEM127, TP53, TSC1, TSC2, VHL, WRN and WT1.  The report date is July 12, 2017.    08/13/2017 Imaging   08/13/2017 DEXA ASSESSMENT: The BMD measured at AP Spine L1-L4 is 1.120 g/cm2 with a T-score of -0.5. This patient is considered normal according to [facility]) criteria.    08/2017 -  Anti-estrogen oral therapy   Letrozole daily, changed to Exemestane  due to poor tolerance of Letrozole   01/16/2018 Imaging   01/16/2018 CT Chest IMPRESSION: Stable 2 mm RIGHT upper lobe nodule.  Post radiation therapy changes at the anterolateral RIGHT upper lobe.  Post RIGHT breast surgery and RIGHT axillary node dissection.  No new abnormalities.  Aortic Atherosclerosis (ICD10-I70.0).   08/09/2018 Mammogram   IMPRESSION: 1. No mammographic evidence of malignancy involving either breast. 2. Expected post lumpectomy and post radiation changes involving the RIGHT breast.      CURRENT THERAPY:  Adjuvant exemestane, started June 2019    INTERVAL HISTORY:  Erin Fields is here for a follow up of right breast cancer. She was last seen by me 9 months ago.  The virtual visit was done through my chart.  She is doing very well overall, her hot flash has much improved, no significant joint stiffness or pain.  She is tolerating exemestane very well.  She is scheduled to have a pelvic ultrasound today, and the plan to have BSO per her GYN's recommendation.  She has good appetite and energy level, denies any significant pain, abdominal discomfort, or other new symptoms.  Review of systems otherwise negative.   MEDICAL HISTORY:  Past Medical History:  Diagnosis Date  . Allergy   . Anxiety   . Asthma   . Cancer [facility])    right breast cancer  . Chicken pox   . Colon polyps   . Depression   . Elevated blood pressure   . Environmental allergies   . Family history of cancer   . Frequent headaches   . GERD (gastroesophageal reflux disease)   . Hay fever   . Hyperlipidemia   .  Hypertension   . Hypothyroidism   . Migraines   . Personal history of radiation therapy   . PONV (postoperative nausea and vomiting)   . Thyroid disease   . Ulcerative colitis ([place])   . Varicose veins     SURGICAL HISTORY: Past Surgical History:  Procedure Laterality Date  . ABLATION  2011  . BREAST BIOPSY Right 02/2017  . BREAST LUMPECTOMY Right 04/2017  . BREAST  LUMPECTOMY WITH RADIOACTIVE SEED AND SENTINEL LYMPH NODE BIOPSY Right 04/24/2017   Procedure: RIGHT BREAST LUMPECTOMY WITH RADIOACTIVE SEED AND SENTINEL LYMPH NODE BIOPSY;  Surgeon: [name], MD;  Location: [place];  Service: General;  Laterality: Right;  . FOOT SURGERY Right 2012  . HAND SURGERY Right   . RE-EXCISION OF BREAST LUMPECTOMY Right 05/15/2017   Procedure: RE-EXCISION OF BREAST LUMPECTOMY;  Surgeon: [name], MD;  Location: [place];  Service: General;  Laterality: Right;  . Septoplasty with turbinate reduction    . TONSILLECTOMY  1980  . VEIN SURGERY     Removal Right Leg  . WISDOM TOOTH EXTRACTION      I have reviewed the social history and family history with the patient and they are unchanged from previous note.  ALLERGIES:  is allergic to ace inhibitors; aspirin; cefaclor; lisinopril; losartan potassium; oxycodone; sulfa antibiotics; and sulfonamide derivatives.  MEDICATIONS:  Current Outpatient Medications  Medication Sig Dispense Refill  . acyclovir (ZOVIRAX) 400 MG tablet TAKE 1 TABLET (400 MG TOTAL) BY MOUTH 3 (THREE) TIMES DAILY AS NEEDED. 270 tablet 1  . azelastine (ASTELIN) 0.1 % nasal spray 2 sprays 2 (two) times daily.  6  . budesonide-formoterol (SYMBICORT) 160-4.5 MCG/ACT inhaler Inhale 2 puffs into the lungs 2 (two) times daily.    . Diclofenac Sodium (PENNSAID) 2 % SOLN Pen[place]aid 20 mg/gram/actuation (2 %) topical soln in metered-dose pump  APPLY TWO PUMPS TOPICALLY TO THE AFFECTED AREA TWICE DAILY    . EPINEPHrine 0.3 mg/0.3 mL IJ SOAJ injection epinephrine 0.3 mg/0.3 mL injection, auto-injector  TK UTD PRN    . exemestane (AROMASIN) 25 MG tablet Take 1 tablet (25 mg total) by mouth daily after breakfast. 90 tablet 1  . hyoscyamine (ANASPAZ) 0.125 MG TBDP disintergrating tablet hyoscyamine 0.125 mg disintegrating tablet    . lansoprazole (PREVACID) 30 MG capsule Take 30 mg by mouth daily at 12 noon.    .  levalbuterol (XOPENEX HFA) 45 MCG/ACT inhaler Inhale 2 puffs into the lungs every 4 (four) hours as needed for wheezing (Q4-6 Hours PRN).    Marland Kitchen levothyroxine (SYNTHROID, LEVOTHROID) 88 MCG tablet Take 1 tablet (88 mcg total) by mouth daily. 90 tablet 3  . meloxicam (MOBIC) 15 MG tablet Take 0.5-1 tablets (7.5-15 mg total) by mouth daily. 90 tablet 1  . mesalamine (LIALDA) 1.2 G EC tablet Take 2.4 g by mouth daily.    . metFORMIN (GLUCOPHAGE) 500 MG tablet TAKE 1 TABLET BY MOUTH EVERY DAY WITH BREAKFAST 90 tablet 3  . montelukast (SINGULAIR) 10 MG tablet Take 10 mg by mouth at bedtime.     Marland Kitchen olmesartan (BENICAR) 40 MG tablet TAKE 1 TABLET BY MOUTH EVERY DAY 90 tablet 1  . promethazine (PHENERGAN) 25 MG tablet TAKE 1/2-1 TABLET DAILY AS NEEDED FOR NAUSEA 30 tablet 5  . rosuvastatin (CRESTOR) 20 MG tablet Take 1 tablet (20 mg total) by mouth daily. 90 tablet 3  . triamcinolone (NASACORT ALLERGY 24HR) 55 MCG/ACT AERO nasal inhaler Place 1 spray into the nose  daily.    . venlafaxine XR (EFFEXOR-XR) 150 MG 24 hr capsule TAKE 1 BY MOUTH EVERY DAY WITH BREAKFAST 90 capsule 1  . zolpidem (AMBIEN) 5 MG tablet TAKE 1 TABLET BY MOUTH EVERY DAY AT BEDTIME AS NEEDED FOR SLEEP 30 tablet 3   No current facility-administered medications for this visit.    PHYSICAL EXAMINATION: ECOG PERFORMANCE STATUS: 0 - Asymptomatic  No vitals taken today, Exam not performed today  LABORATORY DATA:  I have reviewed the data as listed CBC Latest Ref Rng & Units 10/15/2018 06/18/2018 02/07/2018  WBC 4.0 - 10.5 K/uL 8.0 8.2 8.5  Hemoglobin 12.0 - 15.0 g/dL 13.4 13.3 13.0  Hematocrit 36.0 - 46.0 % 41.0 40.2 40.4  Platelets 150 - 400 K/uL 360 355 294     CMP Latest Ref Rng & Units 10/15/2018 06/18/2018 04/08/2018  Glucose 70 - 99 mg/dL 105(H) 154(H) 128(H)  BUN 6 - 20 mg/dL _0 Creatinine 0.44 - 1.00 mg/dL 0.74 0.78 0.66  Sodium 135 - 145 mmol/L 139 139 141  Potassium 3.5 - 5.1 mmol/L 4.0 4.2 4.5  Chloride 98 - 111  mmol/L 104 106 105  CO2 22 - 32 mmol/L 25 20(L) 26  Calcium 8.9 - 10.3 mg/dL 9.6 9.4 9.7  Total Protein 6.5 - 8.1 g/dL 7.1 6.7 -  Total Bilirubin 0.3 - 1.2 mg/dL 0.5 0.5 -  Alkaline Phos 38 - 126 U/L 79 80 -  AST 15 - 41 U/L 17 20 -  ALT 0 - 44 U/L 20 25 -      RADIOGRAPHIC STUDIES: I have personally reviewed the radiological images as listed and agreed with the findings in the report. No results found.   ASSESSMENT & PLAN:  Erin Fields is a 53 y.o. female with   1. Malignant neoplasm of upper-outer quadrant of right breast, invasive lobular carcinoma, stage IA, pT2N0M0, ER/PR: Positive, HER2 negative, Grade II -She was initially diagnosed on 03/16/2017. She is s/p right lumpectomy with SLNB and adjuvant radiation. Due to low Oncotype recurrence score, adjuvant therapy was not recommended. -She started adjuvent anti-estrogen therapy, Letrozole on 08/2017 but switched to Exemestane due to poor tolerance. She is tolerating well, will continuefor 7-10 years due to the lobular histology -She is tolerating exemestane very well, hot flashes has improved, no other significant side effects. -She is currently doing very well, no concerns for recurrence. -Continue exemestane, she will see Dr. [name] in 5 months, I will see her back in 8 months -Next mammogram in April 2021  2. Genetic Testing was negative  3. Anxiety  -She loss her husband and daughter in 2018.  -She is currently on Valium daily managed by Dr. Lorelei Pont  4. Bone Health  -Her recent bone density scan from August 13, 2017 was normal (-0.5) -We will repeat a DEXA scan in April 2021.  We discussed the potential negative impact on her bone density from Aromasin   PLAN: -she is clinically doing well  -Continue exemestane  -I ordered diagnostic mammogram and DEXA to be done in April  -will ask her PCP Dr. Lorelei Pont to get CMP and CBC on her next annual physical in FEb 2021 -she will see Dr. [name] in May, and see me back  in August 2021 with lab    No problem-specific Assessment & Plan notes found for this encounter.   Orders Placed This Encounter  Procedures  . MM DIAG BREAST TOMO BILATERAL    Standing Status:   Future  Standing Expiration Date:   04/15/2020    Order Specific Question:   Reason for Exam (SYMPTOM  OR DIAGNOSIS REQUIRED)    Answer:   screening    Order Specific Question:   Is the patient pregnant?    Answer:   No    Order Specific Question:   Preferred imaging location?    Answer:   [facility]  . DG Bone Density    Standing Status:   Future    Standing Expiration Date:   04/15/2020    Order Specific Question:   Reason for Exam (SYMPTOM  OR DIAGNOSIS REQUIRED)    Answer:   screening    Order Specific Question:   Is the patient pregnant?    Answer:   No    Order Specific Question:   Preferred imaging location?    Answer:   [facility]   I discussed the assessment and treatment plan with the patient. The patient was provided an opportunity to ask questions and all were answered. The patient agreed with the plan and demonstrated an understanding of the instructions.  The patient was advised to call back or seek an in-person evaluation if the symptoms worsen or if the condition fails to improve as anticipated.  I provided 15 minutes of face-to-face video visit time during this encounter, and > 50% was spent counseling as documented under my assessment & plan.     [name], MD 04/16/2019   I, Joslyn Devon, am acting as scribe for [name], MD.   I have reviewed the above documentation for accuracy and completeness, and I agree with the above.

## 2019-04-16 ENCOUNTER — Encounter: Payer: Self-pay | Admitting: Hematology

## 2019-04-16 ENCOUNTER — Telehealth: Payer: Self-pay | Admitting: Hematology

## 2019-04-16 ENCOUNTER — Ambulatory Visit (HOSPITAL_COMMUNITY)
Admission: RE | Admit: 2019-04-16 | Discharge: 2019-04-16 | Disposition: A | Discharge status: Home / Self Care | Payer: BC Managed Care – PPO | Source: Ambulatory Visit | Attending: Obstetrics & Gynecology | Admitting: Obstetrics & Gynecology

## 2019-04-16 ENCOUNTER — Inpatient Hospital Stay: Payer: BC Managed Care – PPO

## 2019-04-16 ENCOUNTER — Inpatient Hospital Stay: Payer: BC Managed Care – PPO | Attending: Hematology | Admitting: Hematology

## 2019-04-16 ENCOUNTER — Other Ambulatory Visit: Payer: Self-pay

## 2019-04-16 DIAGNOSIS — C50411 Malignant neoplasm of upper-outer quadrant of right female breast: Secondary | ICD-10-CM | POA: Diagnosis not present

## 2019-04-16 DIAGNOSIS — Z170 Estrogen receptor positive status [ER+]: Secondary | ICD-10-CM | POA: Diagnosis not present

## 2019-04-16 DIAGNOSIS — Z853 Personal history of malignant neoplasm of breast: Secondary | ICD-10-CM | POA: Diagnosis not present

## 2019-04-16 MED ORDER — EXEMESTANE 25 MG PO TABS: 25.0000 mg | ORAL_TABLET | Freq: Every day | ORAL | 1 refills | Status: DC

## 2019-04-16 NOTE — Telephone Encounter (Signed)
Changed appt to virtual visit per 12/29 sch message- pt aware and agreeable to change.

## 2019-04-21 ENCOUNTER — Other Ambulatory Visit: Payer: Self-pay | Admitting: Family Medicine

## 2019-04-21 ENCOUNTER — Telehealth: Payer: Self-pay | Admitting: *Deleted

## 2019-04-21 DIAGNOSIS — F5104 Psychophysiologic insomnia: Secondary | ICD-10-CM

## 2019-04-21 NOTE — Telephone Encounter (Signed)
Called and spoke with the patient; scheduled an appt for 1/15. Gave information for mask, visitors and parking.

## 2019-04-22 NOTE — Telephone Encounter (Signed)
Requesting:Ambien Contract:none JRP:[id number] Last Visit:12/02/2018 Next Visit:06/09/2019 Last Refill:12/25/2018  Please Advise

## 2019-04-30 ENCOUNTER — Inpatient Hospital Stay: Payer: 59 | Attending: Hematology | Admitting: Gynecologic Oncology

## 2019-04-30 ENCOUNTER — Other Ambulatory Visit: Payer: Self-pay

## 2019-04-30 ENCOUNTER — Encounter: Payer: Self-pay | Admitting: Gynecologic Oncology

## 2019-04-30 VITALS — BP 124/74 | HR 112 | Temp 98.3°F | Resp 20 | Ht 63.5 in | Wt 179.0 lb

## 2019-04-30 DIAGNOSIS — I10 Essential (primary) hypertension: Secondary | ICD-10-CM | POA: Diagnosis not present

## 2019-04-30 DIAGNOSIS — Z801 Family history of malignant neoplasm of trachea, bronchus and lung: Secondary | ICD-10-CM | POA: Insufficient documentation

## 2019-04-30 DIAGNOSIS — F419 Anxiety disorder, unspecified: Secondary | ICD-10-CM | POA: Insufficient documentation

## 2019-04-30 DIAGNOSIS — R911 Solitary pulmonary nodule: Secondary | ICD-10-CM | POA: Insufficient documentation

## 2019-04-30 DIAGNOSIS — E785 Hyperlipidemia, unspecified: Secondary | ICD-10-CM | POA: Diagnosis not present

## 2019-04-30 DIAGNOSIS — Z923 Personal history of irradiation: Secondary | ICD-10-CM

## 2019-04-30 DIAGNOSIS — K219 Gastro-esophageal reflux disease without esophagitis: Secondary | ICD-10-CM | POA: Insufficient documentation

## 2019-04-30 DIAGNOSIS — F329 Major depressive disorder, single episode, unspecified: Secondary | ICD-10-CM | POA: Insufficient documentation

## 2019-04-30 DIAGNOSIS — C50411 Malignant neoplasm of upper-outer quadrant of right female breast: Secondary | ICD-10-CM | POA: Diagnosis not present

## 2019-04-30 DIAGNOSIS — Z780 Asymptomatic menopausal state: Secondary | ICD-10-CM | POA: Insufficient documentation

## 2019-04-30 DIAGNOSIS — Z79811 Long term (current) use of aromatase inhibitors: Secondary | ICD-10-CM | POA: Diagnosis not present

## 2019-04-30 DIAGNOSIS — Z1502 Genetic susceptibility to malignant neoplasm of ovary: Secondary | ICD-10-CM | POA: Insufficient documentation

## 2019-04-30 DIAGNOSIS — E039 Hypothyroidism, unspecified: Secondary | ICD-10-CM | POA: Insufficient documentation

## 2019-04-30 DIAGNOSIS — Z791 Long term (current) use of non-steroidal anti-inflammatories (NSAID): Secondary | ICD-10-CM | POA: Insufficient documentation

## 2019-04-30 DIAGNOSIS — Z170 Estrogen receptor positive status [ER+]: Secondary | ICD-10-CM | POA: Diagnosis not present

## 2019-04-30 DIAGNOSIS — Z79899 Other long term (current) drug therapy: Secondary | ICD-10-CM | POA: Diagnosis not present

## 2019-04-30 DIAGNOSIS — I700 Atherosclerosis of aorta: Secondary | ICD-10-CM | POA: Diagnosis not present

## 2019-04-30 DIAGNOSIS — Z7951 Long term (current) use of inhaled steroids: Secondary | ICD-10-CM | POA: Diagnosis not present

## 2019-04-30 DIAGNOSIS — Z7984 Long term (current) use of oral hypoglycemic drugs: Secondary | ICD-10-CM | POA: Insufficient documentation

## 2019-04-30 DIAGNOSIS — Z803 Family history of malignant neoplasm of breast: Secondary | ICD-10-CM | POA: Diagnosis not present

## 2019-04-30 DIAGNOSIS — Z7952 Long term (current) use of systemic steroids: Secondary | ICD-10-CM | POA: Insufficient documentation

## 2019-04-30 DIAGNOSIS — J45909 Unspecified asthma, uncomplicated: Secondary | ICD-10-CM | POA: Insufficient documentation

## 2019-04-30 NOTE — Progress Notes (Signed)
GYNECOLOGIC ONCOLOGY NEW PATIENT CONSULTATION   Patient Name: Erin Fields  Patient Age: 54 y.o. Date of Service: 04/30/19  Referring Provider: Darreld Mclean, MD 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  We reviewed that a personal history of breast cancer is likely to confer a small increase risk in ovarian cancer.  Based on population based studies, younger women with a diagnosis of breast cancer and a family history of breast or ovarian cancer have a significant increase in her risk of ovarian cancer.  The patient had a postmenopausal diagnosis of breast cancer and a first-degree relative with history of breast cancer.  Her diagnosis of breast cancer supports likely up to a 2-3 fold increase in the risk of ovarian cancer.  Given this, I think it is reasonable to proceed with risk reducing BSO despite negative genetic testing.  The patient has a good understanding that we do not have good screening for ovarian cancer.  She recently had a pelvic ultrasound that showed normal ovaries.  In the setting of the Covid pandemic with a dramatic decrease in surgical call scheduling availability, we discussed postponing surgery for at least 3 months.  I will see the patient back at that time, and if things have improved from a Covid standpoint, we will move forward with scheduling her for robotic BSO.  A copy of this note was sent to the patient's referring provider.   40 minutes of total time was spent for this patient encounter, including preparation, face-to-face counseling with the patient and coordination of care, and documentation of the encounter.   Jeral Pinch, MD  Division of Gynecologic Oncology  Department of Obstetrics and Gynecology  [facility]   ___________________________________________  Chief Complaint: Chief Complaint  Patient presents with  . Malignant neoplasm of upper-outer quadrant of right breast i    History of Present Illness:  Erin Fields is a 54 y.o. y.o. female who is seen in consultation at the request of Copland, Gay Filler, MD for an evaluation of prophylactic BSO.  The patient was diagnosed with stage and receptor breast cancer in 2018 and is currently under the care of Dr. Burr Medico with treatment history noted below.  She started antiestrogen therapy in 08/2017 with Letrozole, switched to Exemestane due to side effects. Plan is for approximately 10 years of treatment.  She had negative genetic testing.  Overall, she reports doing well.  She endorses a good appetite without any emesis.  She has occasional nausea that occurs most days at the same time.  She thinks this developed since April when she began having issues with colitis.  Her bowel function has improved but she continues to have about three bowel movements a day that are soft to semiliquid.  She denies any urinary symptoms.  In terms of menopausal symptoms, she reports some hot flashes.  Her GYN history is notable for exploratory surgery at the age of 59 secondary to abdominal pain.  She notes that a grapefruit sized cyst was found on her left ovary.  No cyst or tissue was excised with the patient was started on high dose oral contraceptive pills for 37-monthwith resolution of her cyst.  Subsequent to that time, she endorses a history of multiple cyst on her left ovaries with at least one episode of  cyst rupture.  In 2010 or 2011, she underwent endometrial ablation for abnormal uterine bleeding in [place].  She had lighter periods for approximately 2 years after that and has since been amenorrheic.  Patient currently lives with her son, who is 68 and doing virtual school at [employer].  She lost both her husband and daughter in 2018, the same year as her breast cancer  diagnosis.  Treatment History: Oncology History Overview Note  Cancer Staging Malignant neoplasm of upper-outer quadrant of right breast in female, estrogen receptor positive ([place]) Staging form: Breast, AJCC 8th Edition - Clinical stage from 03/16/2017: Stage IA (cT1b, cN0, cM0, G2, ER: Positive, PR: Positive, HER2: Negative) - Signed by [name], MD on 03/28/2017 - Pathologic stage from 05/15/2017: Stage IA (pT2, pN0, cM0, G2, ER+, PR+, HER2-, Oncotype DX score: 3) - Signed by [name], MD on 08/06/2017     Malignant neoplasm of upper-outer quadrant of right breast in female, estrogen receptor positive ([place])  03/15/2017 Mammogram   Diagnostic Mammogram Right Breast  IMPRESSION: Palpable abnormality corresponds to distortion and focal mass associated acoustic shadowing measuring 0.8x0.9x0.9cm in the 930 o'clock location of the right breast 7 cm from the nipple.   RECOMMENDATION: Ultrasound-guided core biopsy is recommended and scheduled for the patient.   03/16/2017 Initial Biopsy   Biopsy Results  Diagnosis 03/16/17 Breast, right, needle core biopsy, 9:30 o'clock, ribbon clip placed - INVASIVE MAMMARY CARCINOMA. - SEE COMMENT.    03/16/2017 Receptors her2   Estrogen Receptor: 95%, POSITIVE, STRONG STAINING INTENSITY Progesterone Receptor: 95%, POSITIVE, STRONG STAINING INTENSITY Proliferation Marker Ki67: 3% HER2: NEGATIVE   03/20/2017 Initial Diagnosis   Malignant neoplasm of upper-outer quadrant of right breast in female, estrogen receptor positive ([place])   04/24/2017 Surgery   Right Breast Lumpectomy with Sentinel Node Biopsy with Dr. [name]    04/24/2017 Pathology Results   Diagnosis 04/24/17 1. Breast, lumpectomy, Right w/seed - INVASIVE LOBULAR CARCINOMA, GRADE 2, SPANNING 2.2 CM. - LOBULAR NEOPLASIA (ATYPICAL LOBULAR HYPERPLASIA). - ANTERIOR SUPERIOR MARGIN IS BROADLY POSITIVE. - BIOPSY SITE. - SEE ONCOLOGY TABLE. 2. Breast, excision, Right additional Medial  Margin - INVASIVE LOBULAR CARCINOMA. - LOBULAR CARCINOMA IN SITU. - INVASIVE CARCINOMA IS 0.3 CM FROM NEW MEDIAL MARGIN. 3. Lymph node, sentinel, biopsy, Right Axillary #1 - ISOLATED TUMOR CELLS IN ONE OF ONE LYMPH NODES (0/1). 4. Lymph node, sentinel, biopsy, Right Axillary #2 - ONE OF ONE LYMPH NODES NEGATIVE FOR CARCINOMA (0/1). 5. Lymph node, sentinel, biopsy, Right Axillary #3 - ONE OF ONE LYMPH NODES NEGATIVE FOR CARCINOMA (0/1).   04/24/2017 Oncotype testing   Her Oncotyple recurrence score was 3, her distant recurrence score with Tamoxifen was 3% and her adjuvant chemotherapy benefit was <1%.    05/15/2017 Surgery   Re-excision of Right Breast Lumpectomy with Dr. [name]    05/15/2017 Pathology Results   Diagnosis 05/15/17 Breast, excision, Right additional anterior margin - INVASIVE LOBULAR CARCINOMA. - ATYPICAL LOBULAR HYPERPLASIA. - THE NEW ANTERIOR SURGICAL RESECTION MARGIN IS NEGATIVE FOR CARCINOMA.   05/15/2017 Cancer Staging   Staging form: Breast, AJCC 8th Edition - Pathologic stage from 05/15/2017: Stage IA (pT2, pN0, cM0, G2, ER+, PR+, HER2-, Oncotype DX score: 3) - Signed by [name], MD on 08/06/2017   06/26/2017 -  Radiation Therapy   Adjuvant RT with Dr. [name]   07/12/2017 Genetic Testing   Negative genetic testing on the multi-cancer panel.  The Multi-Gene Panel offered by [name] includes sequencing and/or deletion duplication testing of the following 83  genes: ALK, APC, ATM, AXIN2,BAP1,  BARD1, BLM, BMPR1A, BRCA1, BRCA2, BRIP1, CASR, CDC73, CDH1, CDK4, CDKN1B, CDKN1C, CDKN2A (p14ARF), CDKN2A (p16INK4a), CEBPA, CHEK2, CTNNA1, DICER1, DIS3L2, EGFR (c.2369C>T, p.Thr790Met variant only), EPCAM (Deletion/duplication testing only), FH, FLCN, GATA2, GPC3, GREM1 (Promoter region deletion/duplication testing only), HOXB13 (c.251G>A, p.Gly84Glu), HRAS, KIT, MAX, MEN1, MET, MITF (c.952G>A, p.Glu318Lys variant only), MLH1, MSH2, MSH3, MSH6, MUTYH, NBN, NF1, NF2, NTHL1, PALB2,  PDGFRA, PHOX2B, PMS2, POLD1, POLE, POT1, PRKAR1A, PTCH1, PTEN, RAD50, RAD51C, RAD51D, RB1, RECQL4, RET, RUNX1, SDHAF2, SDHA (sequence changes only), SDHB, SDHC, SDHD, SMAD4, SMARCA4, SMARCB1, SMARCE1, STK11, SUFU, TERT, TERT, TMEM127, TP53, TSC1, TSC2, VHL, WRN and WT1.  The report date is July 12, 2017.    08/13/2017 Imaging   08/13/2017 DEXA ASSESSMENT: The BMD measured at AP Spine L1-L4 is 1.120 g/cm2 with a T-score of -0.5. This patient is considered normal according to [facility]) criteria.    08/2017 -  Anti-estrogen oral therapy   Letrozole daily, changed to Exemestane due to poor tolerance of Letrozole   01/16/2018 Imaging   01/16/2018 CT Chest IMPRESSION: Stable 2 mm RIGHT upper lobe nodule.  Post radiation therapy changes at the anterolateral RIGHT upper lobe.  Post RIGHT breast surgery and RIGHT axillary node dissection.  No new abnormalities.  Aortic Atherosclerosis (ICD10-I70.0).   08/09/2018 Mammogram   IMPRESSION: 1. No mammographic evidence of malignancy involving either breast. 2. Expected post lumpectomy and post radiation changes involving the RIGHT breast.     PAST MEDICAL HISTORY:  Past Medical History:  Diagnosis Date  . Allergy   . Anxiety   . Asthma   . Cancer [facility])    right breast cancer  . Chicken pox   . Colon polyps   . Depression   . Elevated blood pressure   . Environmental allergies   . Family history of cancer   . Frequent headaches   . GERD (gastroesophageal reflux disease)   . Hay fever   . Hyperlipidemia   . Hypertension   . Hypothyroidism   . Migraines   . Personal history of radiation therapy   . PONV (postoperative nausea and vomiting)   . Thyroid disease   . Ulcerative colitis ([place])   . Varicose veins      PAST SURGICAL HISTORY:  Past Surgical History:  Procedure Laterality Date  . ABLATION  2011  . BREAST BIOPSY Right 02/2017  . BREAST LUMPECTOMY Right 04/2017  . BREAST LUMPECTOMY WITH  RADIOACTIVE SEED AND SENTINEL LYMPH NODE BIOPSY Right 04/24/2017   Procedure: RIGHT BREAST LUMPECTOMY WITH RADIOACTIVE SEED AND SENTINEL LYMPH NODE BIOPSY;  Surgeon: [name], MD;  Location: [place];  Service: General;  Laterality: Right;  . ENDOMETRIAL ABLATION  2010   Had abnormal uterine bleeding (heavy and frequent), performed in [place], menses stopped completely 2 years after  . EXPLORATORY LAPAROTOMY     Had exploratory surgery at the age of 73 for abdominal pain with cyst found on her left ovary, not treated surgically  . FOOT SURGERY Right 2012  . HAND SURGERY Right   . RE-EXCISION OF BREAST LUMPECTOMY Right 05/15/2017   Procedure: RE-EXCISION OF BREAST LUMPECTOMY;  Surgeon: [name], MD;  Location: [place];  Service: General;  Laterality: Right;  . Septoplasty with turbinate reduction    . TONSILLECTOMY  1980  . VEIN SURGERY     Removal Right Leg  . WISDOM TOOTH EXTRACTION      OB/GYN HISTORY:  OB History  Gravida  Para Term Preterm AB Living  _0 SAB TAB Ectopic Multiple Live Births               # Outcome Date GA Lbr Len/2nd Weight Sex Delivery Anes PTL Lv  3 AB           2 Para           1 Para             No LMP recorded. Patient has had an ablation.  Age at menarche: 37 Age at menopause: Approximately 50 Hx of HRT: See HPI Hx of STDs: Denies Last pap: 03/2019, negative  History of abnormal pap smears: Denies  SCREENING STUDIES:  Last mammogram: 07/2018  Last colonoscopy:08/2018 Last bone mineral density: 07/2017  MEDICATIONS: Outpatient Encounter Medications as of 04/30/2019  Medication Sig  . acyclovir (ZOVIRAX) 400 MG tablet TAKE 1 TABLET (400 MG TOTAL) BY MOUTH 3 (THREE) TIMES DAILY AS NEEDED.  Marland Kitchen azelastine (ASTELIN) 0.1 % nasal spray 2 sprays 2 (two) times daily.  . budesonide (ENTOCORT EC) 3 MG 24 hr capsule Take 6 mg by mouth daily.  . budesonide-formoterol (SYMBICORT) 160-4.5 MCG/ACT inhaler  Inhale 2 puffs into the lungs 2 (two) times daily.  Marland Kitchen EPINEPHrine 0.3 mg/0.3 mL IJ SOAJ injection epinephrine 0.3 mg/0.3 mL injection, auto-injector  TK UTD PRN  . exemestane (AROMASIN) 25 MG tablet Take 1 tablet (25 mg total) by mouth daily after breakfast.  . Hyoscyamine Sulfate SL 0.125 MG SUBL hyoscyamine 0.125 mg sublingual tablet  TAKE 1 TABLET (0.125 MG TOTAL) BY MOUTH EVERY 6 HOURS AS NEEDED FOR UP TO 30 DAYS FOR CRAMPING.  . lansoprazole (PREVACID) 30 MG capsule Take 30 mg by mouth daily at 12 noon.  . levalbuterol (XOPENEX HFA) 45 MCG/ACT inhaler Inhale 2 puffs into the lungs every 4 (four) hours as needed for wheezing (Q4-6 Hours PRN).  Marland Kitchen levothyroxine (SYNTHROID, LEVOTHROID) 88 MCG tablet Take 1 tablet (88 mcg total) by mouth daily.  . mesalamine (LIALDA) 1.2 G EC tablet Take 2.4 g by mouth daily.  . metFORMIN (GLUCOPHAGE) 500 MG tablet TAKE 1 TABLET BY MOUTH EVERY DAY WITH BREAKFAST  . montelukast (SINGULAIR) 10 MG tablet Take 10 mg by mouth at bedtime.   Marland Kitchen olmesartan (BENICAR) 40 MG tablet TAKE 1 TABLET BY MOUTH EVERY DAY  . promethazine (PHENERGAN) 25 MG tablet TAKE 1/2-1 TABLET DAILY AS NEEDED FOR NAUSEA  . rosuvastatin (CRESTOR) 20 MG tablet Take 1 tablet (20 mg total) by mouth daily.  Marland Kitchen triamcinolone (NASACORT ALLERGY 24HR) 55 MCG/ACT AERO nasal inhaler Place 1 spray into the nose daily.  Marland Kitchen UCERIS 2 MG/ACT FOAM SMARTSIG:1 Unspecified Rectally Twice Daily  . venlafaxine XR (EFFEXOR-XR) 150 MG 24 hr capsule TAKE 1 BY MOUTH EVERY DAY WITH BREAKFAST  . zolpidem (AMBIEN) 5 MG tablet TAKE 1 TABLET BY MOUTH AT BEDTIME AS NEEDED FOR SLEEP  . [DISCONTINUED] Diclofenac Sodium (PENNSAID) 2 % SOLN [place] 20 mg/gram/actuation (2 %) topical soln in metered-dose pump  APPLY TWO PUMPS TOPICALLY TO THE AFFECTED AREA TWICE DAILY  . [DISCONTINUED] hyoscyamine (ANASPAZ) 0.125 MG TBDP disintergrating tablet hyoscyamine 0.125 mg disintegrating tablet  . [DISCONTINUED] meloxicam (MOBIC) 15 MG  tablet Take 0.5-1 tablets (7.5-15 mg total) by mouth daily.   No facility-administered encounter medications on file as of 04/30/2019.    ALLERGIES:  Allergies  Allergen Reactions  . Ace Inhibitors Other (See Comments)    Angina  . Aspirin   .  Cefaclor   . Lisinopril Cough  . Losartan Potassium   . Oxycodone Hives  . Sulfa Antibiotics Other (See Comments)  . Sulfonamide Derivatives      FAMILY HISTORY:  Family History  Problem Relation Age of Onset  . Arthritis Mother 69       Deceased  . Breast cancer Mother   . Lung cancer Mother   . Hyperlipidemia Mother   . Heart disease Mother   . Hypertension Mother   . Diabetes Mother   . Crohn's disease Mother   . Diabetes Maternal Grandfather   . Heart disease Maternal Grandfather   . Heart attack Maternal Grandfather   . Leukemia Maternal Grandfather   . Colitis Sister   . Leukemia Cousin 3       mat cousin  . Lung cancer Father   . Brain cancer Father      SOCIAL HISTORY:    Social Connections:   . Frequency of Communication with Friends and Family: Not on file  . Frequency of Social Gatherings with Friends and Family: Not on file  . Attends Religious Services: Not on file  . Active Member of Clubs or Organizations: Not on file  . Attends Archivist Meetings: Not on file  . Marital Status: Not on file    REVIEW OF SYSTEMS:  Pertinent positives: Bruising/bleeding easily. Denies appetite changes, fevers, chills, fatigue, unexplained weight changes. Denies hearing loss, neck lumps or masses, mouth sores, ringing in ears or voice changes. Denies cough or wheezing.  Denies shortness of breath. Denies chest pain or palpitations. Denies leg swelling. Denies abdominal distention, pain, blood in stools, constipation, diarrhea, nausea, vomiting, or early satiety. Denies pain with intercourse, dysuria, frequency, hematuria or incontinence. Denies hot flashes, pelvic pain, vaginal bleeding or vaginal discharge.    Denies joint pain, back pain or muscle pain/cramps. Denies itching, rash, or wounds. Denies dizziness, headaches, numbness or seizures. Denies swollen lymph nodes or glands. Denies anxiety, depression, confusion, or decreased concentration.  Physical Exam:  Vital Signs for this encounter:  Blood pressure 124/74, pulse (!) 112, temperature 98.3 F (36.8 C), temperature source Temporal, resp. rate 20, height 5' 3.5" (1.613 m), weight 179 lb (81.2 kg), SpO2 97 %. Body mass index is 31.21 kg/m. General: Alert, oriented, no acute distress.  HEENT: Normocephalic, atraumatic. Sclera anicteric.  Chest: Clear to auscultation bilaterally.  Cardiovascular: Mildly tachycardic, regular rhythm, no murmurs, rubs, or gallops.  Extremities: Grossly normal range of motion. Warm, well perfused.   Lymphatics: No cervical, supraclavicular.  GU: Deferred.  LABORATORY AND RADIOLOGIC DATA:  Outside medical records were reviewed to synthesize the above history, along with the history and physical obtained during the visit.   12/22: CA-125: 10.3  Pelvic ultrasound 12/30: Uterus Measurements: 7.3 x 2.3 x 3.1 cm = volume: 27 mL. No fibroids or other mass visualized. Endometrium Thickness: 4 mm in thickness, difficult to visualize. No focal abnormality visualized. Right ovary Measurements: 2.5 x 1.7 x 1.7 cm = volume: 3.8 mL. Normal appearance/no adnexal mass. Left ovary Measurements: 2.2 x 1.8 x 2.1 cm = volume: 4.6 mL. Normal appearance/no adnexal mass. Other findings No abnormal free fluid. IMPRESSION: Unremarkable pelvic ultrasound.

## 2019-04-30 NOTE — Patient Instructions (Signed)
It was a pleasure meeting you today.  I will see you again in 3 months and if things have improved from a Covid standpoint, we will move forward with scheduling robotic surgery to remove your tubes and ovaries.  Please call the clinic if you experience any changes in the meantime at [phone].

## 2019-05-02 ENCOUNTER — Ambulatory Visit: Payer: BC Managed Care – PPO | Admitting: Gynecologic Oncology

## 2019-05-26 ENCOUNTER — Encounter: Payer: Self-pay | Admitting: Family Medicine

## 2019-05-27 NOTE — Telephone Encounter (Signed)
Could you please clarify for patient on dosing schedule?

## 2019-05-30 ENCOUNTER — Other Ambulatory Visit: Payer: Self-pay

## 2019-05-30 ENCOUNTER — Ambulatory Visit (INDEPENDENT_AMBULATORY_CARE_PROVIDER_SITE_OTHER): Payer: 59

## 2019-05-30 DIAGNOSIS — Z23 Encounter for immunization: Secondary | ICD-10-CM | POA: Diagnosis not present

## 2019-06-03 ENCOUNTER — Other Ambulatory Visit: Payer: Self-pay | Admitting: Family Medicine

## 2019-06-03 DIAGNOSIS — F430 Acute stress reaction: Secondary | ICD-10-CM

## 2019-06-06 ENCOUNTER — Other Ambulatory Visit: Payer: Self-pay | Admitting: Family Medicine

## 2019-06-06 DIAGNOSIS — E034 Atrophy of thyroid (acquired): Secondary | ICD-10-CM

## 2019-06-07 NOTE — Progress Notes (Deleted)
[place] at [facility] [address] [place], [place] [address] [phone]  Date:  06/09/2019   Name:  Erin Fields   DOB:  1966-03-05   MRN:  [id number]  PCP:  Darreld Mclean, MD    Chief Complaint: No chief complaint on file.   History of Present Illness:  Erin Fields is a 54 y.o. very pleasant female patient who presents with the following:  Erin Fields is here today for routine follow-up visit She has history of IBS, ulcerative colitis/Crohn's disease, diabetes, breast cancer, hypertension, hyperlipidemia, hypothyroidism, immune deficiency syndrome She has suffered personal tragedies-her daughter died of an overdose in 06/27/15, her husband died in an accident in 06-26-16.  Her surviving son is a Ship broker at [employer] seen by myself in August 2020  Her gastroenterologist is Dr. Valentino Hue visit with him earlier this month.  Per his notes, her diagnosis is ulcerative pancolitis.  She is using oral budesonide and mesalamine.  Unfortunately she has not responded to topical therapies.  They are starting to think about trying Humira for her Oncologist is Dr. Burr Medico.  Breast cancer diagnosed November 2018.  She is status post right lumpectomy with radiation.  She is currently using antiestrogen therapy with Exemestrane, plan to treat for 7 to 10 years  She also recently visited with gynecologic oncology, Dr. Gaylyn Rong.  They are planning to do a oophorectomy for her at a future date, once COVID-19 is less of a threat  Eye exam A1c is due Mammogram Pap? Shingrix dose two  Lab Results  Component Value Date   HGBA1C 7.0 (H) 12/02/2018   She uses Ambien for insomnia  05/23/2019  1   04/22/2019  Zolpidem Tartrate 5 MG Tablet  30.00  30 Je Cop   [id number]   [place] ([id number])   1  0.25 LME  Comm Ins   [place]  05/13/2019  2   05/13/2019  Buprenorphine 0.3 Mg/Ml  2.22  3 [place]   [id number]   Cat ([id number])   0  0.00 mg  Private Pay   [place]  04/22/2019   1   04/22/2019  Zolpidem Tartrate 5 MG Tablet  30.00  30 Je Cop   [id number]   [place] ([id number])   0  0.25 LME  Comm Ins   [place]  03/23/2019  1   12/25/2018  Zolpidem Tartrate 5 MG Tablet  30.00  30 Je Cop   [id number]   Nor ([id number])   3  0.25 LME  Comm Ins   [place]  02/20/2019  1   12/25/2018  Zolpidem Tartrate 5 MG Tablet  30.00  30 Je Cop   [id number]   [place] ([id number])   2  0.25 LME  Comm Ins   [place]  01/21/2019  1   12/25/2018  Zolpidem Tartrate 5 MG Tablet  30.00  30 Je Cop   [id number]   [place] ([id number])   1  0.25 LME  Comm Ins   [place]  12/25/2018  1   12/25/2018  Zolpidem Tartrate 5 MG Tablet  30.00  30 Je Cop   [id number]   [place] ([id number])   0  0.25 LME  Comm Ins     11/24/2018  1   11/23/2018  Zolpidem Tartrate 5 MG Tablet  30.00  30 Je Cop   [id number]   [place] ([id number])   0  0.25 LME  Patient Active Problem List   Diagnosis Date Noted  . Anxiety and depression 01/20/2018  . Right upper lobe pulmonary nodule 01/10/2018  . Nasal polyp, unspecified 01/10/2018  . Left sided abdominal pain 12/13/2017  . Impaired fasting glucose 12/12/2017  . History of adenomatous polyp of colon 11/19/2017  . Genetic testing 07/16/2017  . Family history of breast cancer   . Controlled type 2 diabetes mellitus without complication, without long-term current use of insulin ([place]) 05/28/2017  . Malignant neoplasm of upper-outer quadrant of right breast in female, estrogen receptor positive ([place]) 03/20/2017  . Crohn disease ([place]) 11/29/2016  . Borderline diabetes 12/17/2015  . Ex-smoker 11/18/2015  . Chronic venous insufficiency 09/02/2015  . Symptomatic varicose veins, right 09/02/2015  . Eustachian tube dysfunction 12/04/2014  . Allergic rhinitis 09/15/2014  . Dyspnea 09/15/2014  . Liver hemangioma 08/24/2014  . Elevated triglycerides with high cholesterol 11/26/2013  . Insomnia 08/20/2013  . Hypertension 08/20/2013  . Ulcerative colitis ([place]) 04/20/2013  . Oral herpes 04/20/2013  . Hyperlipidemia 12/06/2006  . HEADACHE 12/06/2006  .  CERVICAL CANCER, HX OF 12/06/2006  . Hypothyroidism 11/29/2006  . Asthma 11/29/2006  . IRRITABLE BOWEL SYNDROME 11/29/2006    Past Medical History:  Diagnosis Date  . Allergy   . Anxiety   . Asthma   . Cancer [facility])    right breast cancer  . Chicken pox   . Colon polyps   . Depression   . Elevated blood pressure   . Environmental allergies   . Family history of cancer   . Frequent headaches   . GERD (gastroesophageal reflux disease)   . Hay fever   . Hyperlipidemia   . Hypertension   . Hypothyroidism   . Migraines   . Personal history of radiation therapy   . PONV (postoperative nausea and vomiting)   . Thyroid disease   . Ulcerative colitis ([place])   . Varicose veins     Past Surgical History:  Procedure Laterality Date  . ABLATION  2011  . BREAST BIOPSY Right 02/2017  . BREAST LUMPECTOMY Right 04/2017  . BREAST LUMPECTOMY WITH RADIOACTIVE SEED AND SENTINEL LYMPH NODE BIOPSY Right 04/24/2017   Procedure: RIGHT BREAST LUMPECTOMY WITH RADIOACTIVE SEED AND SENTINEL LYMPH NODE BIOPSY;  Surgeon: [name], MD;  Location: [facility];  Service: General;  Laterality: Right;  . ENDOMETRIAL ABLATION  2010   Had abnormal uterine bleeding (heavy and frequent), performed in [place], menses stopped completely 2 years after  . EXPLORATORY LAPAROTOMY     Had exploratory surgery at the age of 69 for abdominal pain with cyst found on her left ovary, not treated surgically  . FOOT SURGERY Right 2012  . HAND SURGERY Right   . RE-EXCISION OF BREAST LUMPECTOMY Right 05/15/2017   Procedure: RE-EXCISION OF BREAST LUMPECTOMY;  Surgeon: [name], MD;  Location: [facility];  Service: General;  Laterality: Right;  . Septoplasty with turbinate reduction    . TONSILLECTOMY  1980  . VEIN SURGERY     Removal Right Leg  . WISDOM TOOTH EXTRACTION      Social History   Tobacco Use  . Smoking status: Former Smoker    Packs/day: 4.00    Years: 15.00    Pack  years: 60.00    Types: Cigarettes    Quit date: 09/14/1993    Years since quitting: 25.7  . Smokeless tobacco: Never Used  . Tobacco comment: Quit >20 yrs ago  Substance Use Topics  .  Alcohol use: Yes    Comment: Drinks a couple every 2-3 months  . Drug use: No    Family History  Problem Relation Age of Onset  . Arthritis Mother 28       Deceased  . Breast cancer Mother   . Lung cancer Mother   . Hyperlipidemia Mother   . Heart disease Mother   . Hypertension Mother   . Diabetes Mother   . Crohn's disease Mother   . Diabetes Maternal Grandfather   . Heart disease Maternal Grandfather   . Heart attack Maternal Grandfather   . Leukemia Maternal Grandfather   . Colitis Sister   . Leukemia Cousin 3       mat cousin  . Lung cancer Father   . Brain cancer Father     Allergies  Allergen Reactions  . Ace Inhibitors Other (See Comments)    Angina  . Aspirin   . Cefaclor   . Lisinopril Cough  . Losartan Potassium   . Oxycodone Hives  . Sulfa Antibiotics Other (See Comments)  . Sulfonamide Derivatives     Medication list has been reviewed and updated.  Current Outpatient Medications on File Prior to Visit  Medication Sig Dispense Refill  . acyclovir (ZOVIRAX) 400 MG tablet TAKE 1 TABLET (400 MG TOTAL) BY MOUTH 3 (THREE) TIMES DAILY AS NEEDED. 270 tablet 1  . azelastine (ASTELIN) 0.1 % nasal spray 2 sprays 2 (two) times daily.  6  . budesonide (ENTOCORT EC) 3 MG 24 hr capsule Take 6 mg by mouth daily.    . budesonide-formoterol (SYMBICORT) 160-4.5 MCG/ACT inhaler Inhale 2 puffs into the lungs 2 (two) times daily.    Marland Kitchen EPINEPHrine 0.3 mg/0.3 mL IJ SOAJ injection epinephrine 0.3 mg/0.3 mL injection, auto-injector  TK UTD PRN    . exemestane (AROMASIN) 25 MG tablet Take 1 tablet (25 mg total) by mouth daily after breakfast. 90 tablet 1  . Hyoscyamine Sulfate SL 0.125 MG SUBL hyoscyamine 0.125 mg sublingual tablet  TAKE 1 TABLET (0.125 MG TOTAL) BY MOUTH EVERY 6 HOURS AS  NEEDED FOR UP TO 30 DAYS FOR CRAMPING.    . lansoprazole (PREVACID) 30 MG capsule Take 30 mg by mouth daily at 12 noon.    . levalbuterol (XOPENEX HFA) 45 MCG/ACT inhaler Inhale 2 puffs into the lungs every 4 (four) hours as needed for wheezing (Q4-6 Hours PRN).    Marland Kitchen levothyroxine (SYNTHROID) 88 MCG tablet TAKE 1 TABLET BY MOUTH EVERY DAY 90 tablet 1  . mesalamine (LIALDA) 1.2 G EC tablet Take 2.4 g by mouth daily.    . metFORMIN (GLUCOPHAGE) 500 MG tablet TAKE 1 TABLET BY MOUTH EVERY DAY WITH BREAKFAST 90 tablet 3  . montelukast (SINGULAIR) 10 MG tablet Take 10 mg by mouth at bedtime.     Marland Kitchen olmesartan (BENICAR) 40 MG tablet TAKE 1 TABLET BY MOUTH EVERY DAY 90 tablet 1  . promethazine (PHENERGAN) 25 MG tablet TAKE 1/2-1 TABLET DAILY AS NEEDED FOR NAUSEA 30 tablet 5  . rosuvastatin (CRESTOR) 20 MG tablet Take 1 tablet (20 mg total) by mouth daily. 90 tablet 3  . triamcinolone (NASACORT ALLERGY 24HR) 55 MCG/ACT AERO nasal inhaler Place 1 spray into the nose daily.    Marland Kitchen UCERIS 2 MG/ACT FOAM SMARTSIG:1 Unspecified Rectally Twice Daily    . venlafaxine XR (EFFEXOR-XR) 150 MG 24 hr capsule TAKE 1 BY MOUTH EVERY DAY WITH BREAKFAST 90 capsule 1  . zolpidem (AMBIEN) 5 MG tablet TAKE 1 TABLET BY  MOUTH AT BEDTIME AS NEEDED FOR SLEEP 30 tablet 3   No current facility-administered medications on file prior to visit.    Review of Systems:  As per HPI- otherwise negative.   Physical Examination: There were no vitals filed for this visit. There were no vitals filed for this visit. There is no height or weight on file to calculate BMI. Ideal Body Weight:    GEN: no acute distress. HEENT: Atraumatic, Normocephalic.  Ears and Nose: No external deformity. CV: RRR, No M/G/R. No JVD. No thrill. No extra heart sounds. PULM: CTA B, no wheezes, crackles, rhonchi. No retractions. No resp. distress. No accessory muscle use. ABD: S, NT, ND, +BS. No rebound. No HSM. EXTR: No c/c/e PSYCH: Normally interactive.  Conversant.    Assessment and Plan: *** This visit occurred during the SARS-CoV-2 public health emergency.  Safety protocols were in place, including screening questions prior to the visit, additional usage of staff PPE, and extensive cleaning of exam room while observing appropriate contact time as indicated for disinfecting solutions.    Signed Lamar Blinks, MD

## 2019-06-09 ENCOUNTER — Ambulatory Visit: Payer: 59 | Admitting: Family Medicine

## 2019-06-16 NOTE — Progress Notes (Addendum)
[place] at [facility] [address] [place], [place] [address] [phone]  Date:  06/18/2019   Name:  Erin Fields   DOB:  January 17, 1966   MRN:  [id number]  PCP:  Erin Mclean, MD    Chief Complaint: Lab Work   History of Present Illness:  Erin Fields is a 54 y.o. very pleasant female patient who presents with the following:  Here today for follow-up visit and labs- I saw her most recently in August Erin Fields has history of breast cancer diagnosed 06-19-16, IBD, diabetes, hypothyroidism, IgG deficiency followed by [facility]  She is also suffered personal tragedy-her husband died in an accident June 19, 2016, daughter died of overdose in 2015-06-20.  Her son is a Ship broker at [employer] but is taking some time off due to covid 19  She has history of inflammatory bowel disease, diagnosed with ulcerative pancolitis per her GI doctor- Erin Fields She most recently saw GI in early February-they would like to start her on Humira, but wished for her to get COVID-19 vaccine first if possible  Eye exam; pt went in August of 2020 A1c is due; will get today Patient also would like me to check her magnesium, ferritin, IgG level.  We are glad to do these labs for her Mammogram per [facility] Pap; done per her GYN and UTD- they plan to do a oophorectomy at some point Lipids checked in August, thyroid in range at that time  Lab Results  Component Value Date   HGBA1C 7.0 (H) 12/02/2018   Ambien Effexor XR 150 Crestor 20 Olmesartan Singulair Metformin 500 once daily Levothyroxine Lialda Aromasin 25 mg once daily Symbicort  She is getting her hepatitis series now to prepare to start humira   Patient Active Problem List   Diagnosis Date Noted  . Anxiety and depression 01/20/2018  . Right upper lobe pulmonary nodule 01/10/2018  . Nasal polyp, unspecified 01/10/2018  . Left sided abdominal pain 12/13/2017  . Impaired fasting glucose 12/12/2017  . History of  adenomatous polyp of colon 11/19/2017  . Genetic testing 07/16/2017  . Family history of breast cancer   . Controlled type 2 diabetes mellitus without complication, without long-term current use of insulin ([place]) 05/28/2017  . Malignant neoplasm of upper-outer quadrant of right breast in female, estrogen receptor positive ([place]) 03/20/2017  . Crohn disease ([place]) 11/29/2016  . Borderline diabetes 12/17/2015  . Ex-smoker 11/18/2015  . Chronic venous insufficiency 09/02/2015  . Symptomatic varicose veins, right 09/02/2015  . Eustachian tube dysfunction 12/04/2014  . Allergic rhinitis 09/15/2014  . Dyspnea 09/15/2014  . Liver hemangioma 08/24/2014  . Elevated triglycerides with high cholesterol 11/26/2013  . Insomnia 08/20/2013  . Hypertension 08/20/2013  . Ulcerative colitis ([place]) 04/20/2013  . Oral herpes 04/20/2013  . Hyperlipidemia 12/06/2006  . HEADACHE 12/06/2006  . CERVICAL CANCER, HX OF 12/06/2006  . Hypothyroidism 11/29/2006  . Asthma 11/29/2006  . IRRITABLE BOWEL SYNDROME 11/29/2006    Past Medical History:  Diagnosis Date  . Allergy   . Anxiety   . Asthma   . Cancer [facility])    right breast cancer  . Chicken pox   . Colon polyps   . Depression   . Elevated blood pressure   . Environmental allergies   . Family history of cancer   . Frequent headaches   . GERD (gastroesophageal reflux disease)   . Hay fever   . Hyperlipidemia   . Hypertension   .  Hypothyroidism   . Migraines   . Personal history of radiation therapy   . PONV (postoperative nausea and vomiting)   . Thyroid disease   . Ulcerative colitis ([place])   . Varicose veins     Past Surgical History:  Procedure Laterality Date  . ABLATION  2011  . BREAST BIOPSY Right 02/2017  . BREAST LUMPECTOMY Right 04/2017  . BREAST LUMPECTOMY WITH RADIOACTIVE SEED AND SENTINEL LYMPH NODE BIOPSY Right 04/24/2017   Procedure: RIGHT BREAST LUMPECTOMY WITH RADIOACTIVE SEED AND SENTINEL LYMPH NODE BIOPSY;  Surgeon: Erin Klein, MD;  Location: [facility];  Service: General;  Laterality: Right;  . ENDOMETRIAL ABLATION  2010   Had abnormal uterine bleeding (heavy and frequent), performed in [place], menses stopped completely 2 years after  . EXPLORATORY LAPAROTOMY     Had exploratory surgery at the age of 37 for abdominal pain with cyst found on her left ovary, not treated surgically  . FOOT SURGERY Right 2012  . HAND SURGERY Right   . RE-EXCISION OF BREAST LUMPECTOMY Right 05/15/2017   Procedure: RE-EXCISION OF BREAST LUMPECTOMY;  Surgeon: Erin Klein, MD;  Location: [facility];  Service: General;  Laterality: Right;  . Septoplasty with turbinate reduction    . TONSILLECTOMY  1980  . VEIN SURGERY     Removal Right Leg  . WISDOM TOOTH EXTRACTION      Social History   Tobacco Use  . Smoking status: Former Smoker    Packs/day: 4.00    Years: 15.00    Pack years: 60.00    Types: Cigarettes    Quit date: 09/14/1993    Years since quitting: 25.7  . Smokeless tobacco: Never Used  . Tobacco comment: Quit >20 yrs ago  Substance Use Topics  . Alcohol use: Yes    Comment: Drinks a couple every 2-3 months  . Drug use: No    Family History  Problem Relation Age of Onset  . Arthritis Mother 76       Deceased  . Breast cancer Mother   . Lung cancer Mother   . Hyperlipidemia Mother   . Heart disease Mother   . Hypertension Mother   . Diabetes Mother   . Crohn's disease Mother   . Diabetes Maternal Grandfather   . Heart disease Maternal Grandfather   . Heart attack Maternal Grandfather   . Leukemia Maternal Grandfather   . Colitis Sister   . Leukemia Cousin 3       mat cousin  . Lung cancer Father   . Brain cancer Father     Allergies  Allergen Reactions  . Ace Inhibitors Other (See Comments)    Angina  . Aspirin   . Cefaclor   . Lisinopril Cough  . Losartan Potassium   . Oxycodone Hives  . Sulfa Antibiotics Other (See Comments)  . Sulfonamide Derivatives      Medication list has been reviewed and updated.  Current Outpatient Medications on File Prior to Visit  Medication Sig Dispense Refill  . acyclovir (ZOVIRAX) 400 MG tablet TAKE 1 TABLET (400 MG TOTAL) BY MOUTH 3 (THREE) TIMES DAILY AS NEEDED. 270 tablet 1  . azelastine (ASTELIN) 0.1 % nasal spray 2 sprays 2 (two) times daily.  6  . budesonide (ENTOCORT EC) 3 MG 24 hr capsule Take 6 mg by mouth daily.    . budesonide-formoterol (SYMBICORT) 160-4.5 MCG/ACT inhaler Inhale 2 puffs into the lungs 2 (two) times daily.    Marland Kitchen EPINEPHrine  0.3 mg/0.3 mL IJ SOAJ injection epinephrine 0.3 mg/0.3 mL injection, auto-injector  TK UTD PRN    . exemestane (AROMASIN) 25 MG tablet Take 1 tablet (25 mg total) by mouth daily after breakfast. 90 tablet 1  . Hyoscyamine Sulfate SL 0.125 MG SUBL hyoscyamine 0.125 mg sublingual tablet  TAKE 1 TABLET (0.125 MG TOTAL) BY MOUTH EVERY 6 HOURS AS NEEDED FOR UP TO 30 DAYS FOR CRAMPING.    . lansoprazole (PREVACID) 30 MG capsule Take 30 mg by mouth daily at 12 noon.    . levalbuterol (XOPENEX HFA) 45 MCG/ACT inhaler Inhale 2 puffs into the lungs every 4 (four) hours as needed for wheezing (Q4-6 Hours PRN).    Marland Kitchen levothyroxine (SYNTHROID) 88 MCG tablet TAKE 1 TABLET BY MOUTH EVERY DAY 90 tablet 1  . mesalamine (LIALDA) 1.2 G EC tablet Take 2.4 g by mouth daily.    . metFORMIN (GLUCOPHAGE) 500 MG tablet TAKE 1 TABLET BY MOUTH EVERY DAY WITH BREAKFAST 90 tablet 3  . montelukast (SINGULAIR) 10 MG tablet Take 10 mg by mouth at bedtime.     Marland Kitchen olmesartan (BENICAR) 40 MG tablet TAKE 1 TABLET BY MOUTH EVERY DAY 90 tablet 1  . promethazine (PHENERGAN) 25 MG tablet TAKE 1/2-1 TABLET DAILY AS NEEDED FOR NAUSEA 30 tablet 5  . rosuvastatin (CRESTOR) 20 MG tablet Take 1 tablet (20 mg total) by mouth daily. 90 tablet 3  . triamcinolone (NASACORT ALLERGY 24HR) 55 MCG/ACT AERO nasal inhaler Place 1 spray into the nose daily.    Marland Kitchen UCERIS 2 MG/ACT FOAM SMARTSIG:1 Unspecified Rectally Twice  Daily    . venlafaxine XR (EFFEXOR-XR) 150 MG 24 hr capsule TAKE 1 BY MOUTH EVERY DAY WITH BREAKFAST 90 capsule 1  . zolpidem (AMBIEN) 5 MG tablet TAKE 1 TABLET BY MOUTH AT BEDTIME AS NEEDED FOR SLEEP 30 tablet 3   No current facility-administered medications on file prior to visit.    Review of Systems:  As per HPI- otherwise negative.   Physical Examination: Vitals:   06/18/19 0917  BP: 134/80  Pulse: 96  Resp: 16  Temp: (!) 96.4 F (35.8 C)  SpO2: 99%   Vitals:   06/18/19 0917  Weight: 177 lb (80.3 kg)  Height: 5' 3.5" (1.613 m)   Body mass index is 30.86 kg/m. Ideal Body Weight: Weight in (lb) to have BMI = 25: 143.1  GEN: no acute distress.  Overweight, looks well HEENT: Atraumatic, Normocephalic.  Ears and Nose: No external deformity. CV: RRR, No M/G/R. No JVD. No thrill. No extra heart sounds. PULM: CTA B, no wheezes, crackles, rhonchi. No retractions. No resp. distress. No accessory muscle use. EXTR: No c/c/e PSYCH: Normally interactive. Conversant.    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  We will check her A1c today Blood pressure under control, refilled her medication.  Confirm she has no  sign of allergic reaction  to olmesartan Check lipids, refilled Crestor Thyroid level pending She is continuing to follow-up with oncology, GI, and her immunology specialist at [facility].  Will obtain appropriate labs for her as needed today  moderate medical decision making today Plan to follow-up in 6 months assuming all is well This visit occurred during the SARS-CoV-2 public health emergency.  Safety protocols were in place, including screening questions prior to the visit, additional usage of staff PPE, and extensive cleaning of exam room while observing appropriate contact time as indicated for disinfecting solutions.   Send labs to her oncologist Burr Medico Signed Lamar Blinks, MD  Received her labs as below, message to patient  Results for orders placed or performed in visit on 06/18/19  CBC  Result Value Ref Range   WBC 8.8 4.0 - 10.5 K/uL   RBC 4.43 3.87 - 5.11 Mil/uL   Platelets 408.0 (H) 150.0 - 400.0 K/uL   Hemoglobin 12.4 12.0 - 15.0 g/dL   HCT 37.5 36.0 - 46.0 %   MCV 84.8 78.0 - 100.0 fl   MCHC 33.0 30.0 - 36.0 g/dL   RDW 15.0 11.5 - 15.5 %  Comprehensive metabolic panel  Result Value Ref Range   Sodium 142 135 - 145 mEq/L   Potassium 4.2 3.5 - 5.1 mEq/L   Chloride 104 96 - 112 mEq/L   CO2 29 19 - 32 mEq/L   Glucose, Bld 139 (H) 70 - 99 mg/dL   BUN 9 6 - 23 mg/dL   Creatinine, Ser 0.63 0.40 - 1.20 mg/dL   Total Bilirubin 0.4 0.2 - 1.2 mg/dL   Alkaline Phosphatase 73 39 - 117 U/L   AST 9 0 - 37 U/L   ALT 14 0 - 35 U/L   Total Protein 6.3 6.0 - 8.3 g/dL   Albumin 4.0 3.5 - 5.2 g/dL   GFR 97.70 >60.00 mL/min   Calcium 9.8 8.4 - 10.5 mg/dL  Hemoglobin A1c  Result Value Ref Range   Hgb A1c MFr Bld 8.2 (H) 4.6 - 6.5 %  TSH  Result Value Ref Range   TSH 1.28 0.35 - 4.50 uIU/mL  Lipid panel  Result Value Ref Range   Cholesterol 153 0 - 200 mg/dL   Triglycerides 159.0 (H) 0.0 - 149.0 mg/dL   HDL 37.50 (L) >39.00 mg/dL   VLDL 31.8 0.0 - 40.0 mg/dL   LDL Cholesterol 83 0 - 99 mg/dL   Total  CHOL/HDL Ratio 4    NonHDL 115.25   Magnesium  Result Value Ref Range   Magnesium 1.8 1.5 - 2.5 mg/dL  Ferritin  Result Value Ref Range   Ferritin 8.7 (L) 10.0 - 291.0 ng/mL   The 10-year ASCVD risk score Mikey Bussing DC Jr., et al., 2013) is: 6.6%   Values used to calculate the score:     Age: 8 years     Sex: Female     Is Non-Hispanic African American: No     Diabetic: Yes     Tobacco smoker: No     Systolic Blood Pressure: 891 mmHg     Is BP treated: Yes     HDL Cholesterol: 37.5 mg/dL     Total Cholesterol: 153 mg/dL   Your blood counts look okay, minimally high platelet count is acceptable Metabolic profile is normal Your A1c is high, I would suggest increasing your Metformin. I believe you are currently taking 500 mg once a day. I would like to gradually increase your dose to  1000 mg twice a day-we want to do this slowly to minimize GI side effects. Can you please increase to 1 pill twice a day for about a week, and let me know how it works for you If you are not able to tolerate such a high dose of Metformin, we can add another medication  Thyroid is normal Cholesterol is overall okay-continue Crestor Magnesium normal  Your iron-ferritin-level is low. I would recommend taking an over-the-counter iron tablet with 65 mg of iron once a day  We will want to repeat your iron level and A1c in about 3 months  Take care, let me know if any questions  I will forward your results to Dr. Burr Medico for you

## 2019-06-16 NOTE — Patient Instructions (Signed)
It was good to see you again today, I will be in touch with your labs as soon as possible  Assuming labs look okay, we can plan to visit in about 6 months

## 2019-06-17 ENCOUNTER — Other Ambulatory Visit: Payer: Self-pay

## 2019-06-18 ENCOUNTER — Encounter: Payer: Self-pay | Admitting: Family Medicine

## 2019-06-18 ENCOUNTER — Ambulatory Visit: Payer: 59 | Admitting: Family Medicine

## 2019-06-18 ENCOUNTER — Other Ambulatory Visit: Payer: Self-pay

## 2019-06-18 VITALS — BP 134/80 | HR 96 | Temp 96.4°F | Resp 16 | Ht 63.5 in | Wt 177.0 lb

## 2019-06-18 DIAGNOSIS — C50411 Malignant neoplasm of upper-outer quadrant of right female breast: Secondary | ICD-10-CM

## 2019-06-18 DIAGNOSIS — E782 Mixed hyperlipidemia: Secondary | ICD-10-CM | POA: Diagnosis not present

## 2019-06-18 DIAGNOSIS — I10 Essential (primary) hypertension: Secondary | ICD-10-CM

## 2019-06-18 DIAGNOSIS — D803 Selective deficiency of immunoglobulin G [IgG] subclasses: Secondary | ICD-10-CM

## 2019-06-18 DIAGNOSIS — E119 Type 2 diabetes mellitus without complications: Secondary | ICD-10-CM | POA: Diagnosis not present

## 2019-06-18 DIAGNOSIS — E034 Atrophy of thyroid (acquired): Secondary | ICD-10-CM | POA: Diagnosis not present

## 2019-06-18 DIAGNOSIS — R252 Cramp and spasm: Secondary | ICD-10-CM

## 2019-06-18 DIAGNOSIS — Z5181 Encounter for therapeutic drug level monitoring: Secondary | ICD-10-CM

## 2019-06-18 DIAGNOSIS — Z170 Estrogen receptor positive status [ER+]: Secondary | ICD-10-CM

## 2019-06-18 DIAGNOSIS — K5100 Ulcerative (chronic) pancolitis without complications: Secondary | ICD-10-CM

## 2019-06-18 LAB — COMPREHENSIVE METABOLIC PANEL
ALT: 14 U/L (ref 0–35)
AST: 9 U/L (ref 0–37)
Albumin: 4 g/dL (ref 3.5–5.2)
Alkaline Phosphatase: 73 U/L (ref 39–117)
BUN: 9 mg/dL (ref 6–23)
CO2: 29 mEq/L (ref 19–32)
Calcium: 9.8 mg/dL (ref 8.4–10.5)
Chloride: 104 mEq/L (ref 96–112)
Creatinine, Ser: 0.63 mg/dL (ref 0.40–1.20)
GFR: 97.7 mL/min (ref >60.00)
Glucose, Bld: 139 mg/dL — ABNORMAL HIGH (ref 70–99)
Potassium: 4.2 mEq/L (ref 3.5–5.1)
Sodium: 142 mEq/L (ref 135–145)
Total Bilirubin: 0.4 mg/dL (ref 0.2–1.2)
Total Protein: 6.3 g/dL (ref 6.0–8.3)

## 2019-06-18 LAB — LIPID PANEL
Cholesterol: 153 mg/dL (ref 0–200)
HDL: 37.5 mg/dL — ABNORMAL LOW (ref >39.00)
LDL Cholesterol: 83 mg/dL (ref 0–99)
NonHDL: 115.25
Total CHOL/HDL Ratio: 4
Triglycerides: 159 mg/dL — ABNORMAL HIGH (ref 0.0–149.0)
VLDL: 31.8 mg/dL (ref 0.0–40.0)

## 2019-06-18 LAB — CBC
HCT: 37.5 % (ref 36.0–46.0)
Hemoglobin: 12.4 g/dL (ref 12.0–15.0)
MCHC: 33 g/dL (ref 30.0–36.0)
MCV: 84.8 fl (ref 78.0–100.0)
Platelets: 408 10*3/uL — ABNORMAL HIGH (ref 150.0–400.0)
RBC: 4.43 Mil/uL (ref 3.87–5.11)
RDW: 15 % (ref 11.5–15.5)
WBC: 8.8 10*3/uL (ref 4.0–10.5)

## 2019-06-18 LAB — MAGNESIUM: Magnesium: 1.8 mg/dL (ref 1.5–2.5)

## 2019-06-18 LAB — TSH: TSH: 1.28 u[IU]/mL (ref 0.35–4.50)

## 2019-06-18 LAB — HEMOGLOBIN A1C: Hgb A1c MFr Bld: 8.2 % — ABNORMAL HIGH (ref 4.6–6.5)

## 2019-06-18 LAB — FERRITIN: Ferritin: 8.7 ng/mL — ABNORMAL LOW (ref 10.0–291.0)

## 2019-06-18 MED ORDER — ROSUVASTATIN CALCIUM 20 MG PO TABS: 20.0000 mg | ORAL_TABLET | Freq: Every day | ORAL | 3 refills | Status: DC

## 2019-06-18 MED ORDER — OLMESARTAN MEDOXOMIL 40 MG PO TABS: 40.0000 mg | ORAL_TABLET | Freq: Every day | ORAL | 1 refills | Status: DC

## 2019-06-19 LAB — IGG: IgG (Immunoglobin G), Serum: 482 mg/dL — ABNORMAL LOW (ref 600–1640)

## 2019-06-23 ENCOUNTER — Encounter: Payer: Self-pay | Admitting: Family Medicine

## 2019-06-27 ENCOUNTER — Other Ambulatory Visit: Payer: Self-pay

## 2019-06-27 ENCOUNTER — Ambulatory Visit (INDEPENDENT_AMBULATORY_CARE_PROVIDER_SITE_OTHER): Payer: 59

## 2019-06-27 DIAGNOSIS — Z23 Encounter for immunization: Secondary | ICD-10-CM | POA: Diagnosis not present

## 2019-06-27 NOTE — Progress Notes (Signed)
Patient in for Hepatitis #2 of series per PCP order.  Patient tolerated injection well.   Appt for Hep #3 of series scheduled.   Gerilyn Nestle, RN

## 2019-07-04 ENCOUNTER — Encounter: Payer: Self-pay | Admitting: Family Medicine

## 2019-07-08 ENCOUNTER — Encounter: Payer: Self-pay | Admitting: Family Medicine

## 2019-07-14 ENCOUNTER — Encounter: Payer: Self-pay | Admitting: Family Medicine

## 2019-07-14 ENCOUNTER — Other Ambulatory Visit: Payer: Self-pay

## 2019-07-14 MED ORDER — METFORMIN HCL 500 MG PO TABS: 500.0000 mg | ORAL_TABLET | Freq: Two times a day (BID) | ORAL | 3 refills | Status: DC

## 2019-07-14 MED ORDER — METFORMIN HCL 500 MG PO TABS: ORAL_TABLET | ORAL | 3 refills | Status: DC

## 2019-07-31 ENCOUNTER — Inpatient Hospital Stay: Payer: 59 | Attending: Hematology | Admitting: Gynecologic Oncology

## 2019-07-31 ENCOUNTER — Other Ambulatory Visit: Payer: Self-pay | Admitting: Gynecologic Oncology

## 2019-07-31 ENCOUNTER — Encounter: Payer: Self-pay | Admitting: Gynecologic Oncology

## 2019-07-31 ENCOUNTER — Other Ambulatory Visit: Payer: Self-pay

## 2019-07-31 VITALS — BP 133/71 | HR 95 | Temp 98.2°F | Resp 18 | Ht 63.5 in | Wt 174.2 lb

## 2019-07-31 DIAGNOSIS — Z1502 Genetic susceptibility to malignant neoplasm of ovary: Secondary | ICD-10-CM

## 2019-07-31 DIAGNOSIS — Z01818 Encounter for other preprocedural examination: Secondary | ICD-10-CM | POA: Diagnosis not present

## 2019-07-31 DIAGNOSIS — C50411 Malignant neoplasm of upper-outer quadrant of right female breast: Secondary | ICD-10-CM | POA: Diagnosis not present

## 2019-07-31 DIAGNOSIS — Z170 Estrogen receptor positive status [ER+]: Secondary | ICD-10-CM

## 2019-07-31 DIAGNOSIS — Z79811 Long term (current) use of aromatase inhibitors: Secondary | ICD-10-CM | POA: Diagnosis not present

## 2019-07-31 MED ORDER — HYDROCODONE-ACETAMINOPHEN 5-325 MG PO TABS: 1.0000 | ORAL_TABLET | Freq: Four times a day (QID) | ORAL | 0 refills | Status: DC | PRN

## 2019-07-31 NOTE — H&P (View-Only) (Signed)
Gynecologic Oncology Return Clinic Visit  4/15   Reason for Visit: Follow-up discussion regarding therapeutic BSO in the setting of estrogen positive breast cancer  Treatment History: Oncology History Overview Note  Cancer Staging Malignant neoplasm of upper-outer quadrant of right breast in female, estrogen receptor positive ([place]) Staging form: Breast, AJCC 8th Edition - Clinical stage from 03/16/2017: Stage IA (cT1b, cN0, cM0, G2, ER: Positive, PR: Positive, HER2: Negative) - Signed by [name], MD on 03/28/2017 - Pathologic stage from 05/15/2017: Stage IA (pT2, pN0, cM0, G2, ER+, PR+, HER2-, Oncotype DX score: 3) - Signed by [name], MD on 08/06/2017     Malignant neoplasm of upper-outer quadrant of right breast in female, estrogen receptor positive ([place])  03/15/2017 Mammogram   Diagnostic Mammogram Right Breast  IMPRESSION: Palpable abnormality corresponds to distortion and focal mass associated acoustic shadowing measuring 0.8x0.9x0.9cm in the 930 o'clock location of the right breast 7 cm from the nipple.   RECOMMENDATION: Ultrasound-guided core biopsy is recommended and scheduled for the patient.   03/16/2017 Initial Biopsy   Biopsy Results  Diagnosis 03/16/17 Breast, right, needle core biopsy, 9:30 o'clock, ribbon clip placed - INVASIVE MAMMARY CARCINOMA. - SEE COMMENT.    03/16/2017 Receptors her2   Estrogen Receptor: 95%, POSITIVE, STRONG STAINING INTENSITY Progesterone Receptor: 95%, POSITIVE, STRONG STAINING INTENSITY Proliferation Marker Ki67: 3% HER2: NEGATIVE   03/20/2017 Initial Diagnosis   Malignant neoplasm of upper-outer quadrant of right breast in female, estrogen receptor positive ([place])   04/24/2017 Surgery   Right Breast Lumpectomy with Sentinel Node Biopsy with Dr. [name]    04/24/2017 Pathology Results   Diagnosis 04/24/17 1. Breast, lumpectomy, Right w/seed - INVASIVE LOBULAR CARCINOMA, GRADE 2, SPANNING 2.2 CM. - LOBULAR NEOPLASIA (ATYPICAL  LOBULAR HYPERPLASIA). - ANTERIOR SUPERIOR MARGIN IS BROADLY POSITIVE. - BIOPSY SITE. - SEE ONCOLOGY TABLE. 2. Breast, excision, Right additional Medial Margin - INVASIVE LOBULAR CARCINOMA. - LOBULAR CARCINOMA IN SITU. - INVASIVE CARCINOMA IS 0.3 CM FROM NEW MEDIAL MARGIN. 3. Lymph node, sentinel, biopsy, Right Axillary #1 - ISOLATED TUMOR CELLS IN ONE OF ONE LYMPH NODES (0/1). 4. Lymph node, sentinel, biopsy, Right Axillary #2 - ONE OF ONE LYMPH NODES NEGATIVE FOR CARCINOMA (0/1). 5. Lymph node, sentinel, biopsy, Right Axillary #3 - ONE OF ONE LYMPH NODES NEGATIVE FOR CARCINOMA (0/1).   04/24/2017 Oncotype testing   Her Oncotyple recurrence score was 3, her distant recurrence score with Tamoxifen was 3% and her adjuvant chemotherapy benefit was <1%.    05/15/2017 Surgery   Re-excision of Right Breast Lumpectomy with Dr. [name]    05/15/2017 Pathology Results   Diagnosis 05/15/17 Breast, excision, Right additional anterior margin - INVASIVE LOBULAR CARCINOMA. - ATYPICAL LOBULAR HYPERPLASIA. - THE NEW ANTERIOR SURGICAL RESECTION MARGIN IS NEGATIVE FOR CARCINOMA.   05/15/2017 Cancer Staging   Staging form: Breast, AJCC 8th Edition - Pathologic stage from 05/15/2017: Stage IA (pT2, pN0, cM0, G2, ER+, PR+, HER2-, Oncotype DX score: 3) - Signed by [name], MD on 08/06/2017   06/26/2017 -  Radiation Therapy   Adjuvant RT with Dr. [name]   07/12/2017 Genetic Testing   Negative genetic testing on the multi-cancer panel.  The Multi-Gene Panel offered by [name] includes sequencing and/or deletion duplication testing of the following 83 genes: ALK, APC, ATM, AXIN2,BAP1,  BARD1, BLM, BMPR1A, BRCA1, BRCA2, BRIP1, CASR, CDC73, CDH1, CDK4, CDKN1B, CDKN1C, CDKN2A (p14ARF), CDKN2A (p16INK4a), CEBPA, CHEK2, CTNNA1, DICER1, DIS3L2, EGFR (c.2369C>T, p.Thr790Met variant only), EPCAM (Deletion/duplication testing only), FH, FLCN, GATA2, GPC3, GREM1 (Promoter region  deletion/duplication testing only), HOXB13  (c.251G>A, p.Gly84Glu), HRAS, KIT, MAX, MEN1, MET, MITF (c.952G>A, p.Glu318Lys variant only), MLH1, MSH2, MSH3, MSH6, MUTYH, NBN, NF1, NF2, NTHL1, PALB2, PDGFRA, PHOX2B, PMS2, POLD1, POLE, POT1, PRKAR1A, PTCH1, PTEN, RAD50, RAD51C, RAD51D, RB1, RECQL4, RET, RUNX1, SDHAF2, SDHA (sequence changes only), SDHB, SDHC, SDHD, SMAD4, SMARCA4, SMARCB1, SMARCE1, STK11, SUFU, TERT, TERT, TMEM127, TP53, TSC1, TSC2, VHL, WRN and WT1.  The report date is July 12, 2017.    08/13/2017 Imaging   08/13/2017 DEXA ASSESSMENT: The BMD measured at AP Spine L1-L4 is 1.120 g/cm2 with a T-score of -0.5. This patient is considered normal according to [facility]) criteria.    08/2017 -  Anti-estrogen oral therapy   Letrozole daily, changed to Exemestane due to poor tolerance of Letrozole   01/16/2018 Imaging   01/16/2018 CT Chest IMPRESSION: Stable 2 mm RIGHT upper lobe nodule.  Post radiation therapy changes at the anterolateral RIGHT upper lobe.  Post RIGHT breast surgery and RIGHT axillary node dissection.  No new abnormalities.  Aortic Atherosclerosis (ICD10-I70.0).   08/09/2018 Mammogram   IMPRESSION: 1. No mammographic evidence of malignancy involving either breast. 2. Expected post lumpectomy and post radiation changes involving the RIGHT breast.     Interval History: Patient has overall been doing well since her last visit with me.  She continues to struggle with her inflammatory bowel disease which has worsened some.  She has significant diarrhea and associated cramping/pain.  She is set to start Humira shortly after she gets her second Covid vaccine on 4/27.  Otherwise she denies any pelvic pain, vaginal bleeding or discharge.  She denies any urinary symptoms.  She reports having a good appetite without any nausea or emesis.  Past Medical/Surgical History: Past Medical History:  Diagnosis Date  . Allergy   . Anxiety   . Asthma   . Cancer [facility])    right breast cancer   . Chicken pox   . Colon polyps   . Depression   . Elevated blood pressure   . Environmental allergies   . Family history of cancer   . Frequent headaches   . GERD (gastroesophageal reflux disease)   . Hay fever   . Hyperlipidemia   . Hypertension   . Hypothyroidism   . Migraines   . Personal history of radiation therapy   . PONV (postoperative nausea and vomiting)   . Thyroid disease   . Ulcerative colitis ([place])   . Varicose veins     Past Surgical History:  Procedure Laterality Date  . ABLATION  2011  . BREAST BIOPSY Right 02/2017  . BREAST LUMPECTOMY Right 04/2017  . BREAST LUMPECTOMY WITH RADIOACTIVE SEED AND SENTINEL LYMPH NODE BIOPSY Right 04/24/2017   Procedure: RIGHT BREAST LUMPECTOMY WITH RADIOACTIVE SEED AND SENTINEL LYMPH NODE BIOPSY;  Surgeon: [name], MD;  Location: [place];  Service: General;  Laterality: Right;  . ENDOMETRIAL ABLATION  2010   Had abnormal uterine bleeding (heavy and frequent), performed in [place], menses stopped completely 2 years after  . EXPLORATORY LAPAROTOMY     Had exploratory surgery at the age of 69 for abdominal pain with cyst found on her left ovary, not treated surgically  . FOOT SURGERY Right 2012  . HAND SURGERY Right   . RE-EXCISION OF BREAST LUMPECTOMY Right 05/15/2017   Procedure: RE-EXCISION OF BREAST LUMPECTOMY;  Surgeon: [name], MD;  Location: [address];  Service: General;  Laterality: Right;  . Septoplasty with turbinate reduction    .  TONSILLECTOMY  1980  . VEIN SURGERY     Removal Right Leg  . WISDOM TOOTH EXTRACTION      Family History  Problem Relation Age of Onset  . Arthritis Mother 6       Deceased  . Breast cancer Mother   . Lung cancer Mother   . Hyperlipidemia Mother   . Heart disease Mother   . Hypertension Mother   . Diabetes Mother   . Crohn's disease Mother   . Diabetes Maternal Grandfather   . Heart disease Maternal Grandfather   . Heart attack  Maternal Grandfather   . Leukemia Maternal Grandfather   . Colitis Sister   . Leukemia Cousin 3       mat cousin  . Lung cancer Father   . Brain cancer Father     Social History   Socioeconomic History  . Marital status: Widowed    Spouse name: Not on file  . Number of children: Not on file  . Years of education: Not on file  . Highest education level: Not on file  Occupational History  . Not on file  Tobacco Use  . Smoking status: Former Smoker    Packs/day: 4.00    Years: 15.00    Pack years: 60.00    Types: Cigarettes    Quit date: 09/14/1993    Years since quitting: 25.8  . Smokeless tobacco: Never Used  . Tobacco comment: Quit >20 yrs ago  Substance and Sexual Activity  . Alcohol use: Yes    Comment: Drinks a couple every 2-3 months  . Drug use: No  . Sexual activity: Not Currently    Comment: ablation  Other Topics Concern  . Not on file  Social History Narrative  . Not on file   Social Determinants of Health   Financial Resource Strain:   . Difficulty of Paying Living Expenses:   Food Insecurity:   . Worried About Charity fundraiser in the Last Year:   . Arboriculturist in the Last Year:   Transportation Needs:   . Film/video editor (Medical):   Marland Kitchen Lack of Transportation (Non-Medical):   Physical Activity:   . Days of Exercise per Week:   . Minutes of Exercise per Session:   Stress:   . Feeling of Stress :   Social Connections:   . Frequency of Communication with Friends and Family:   . Frequency of Social Gatherings with Friends and Family:   . Attends Religious Services:   . Active Member of Clubs or Organizations:   . Attends Archivist Meetings:   Marland Kitchen Marital Status:     Current Medications:  Current Outpatient Medications:  .  acyclovir (ZOVIRAX) 400 MG tablet, TAKE 1 TABLET (400 MG TOTAL) BY MOUTH 3 (THREE) TIMES DAILY AS NEEDED., Disp: 270 tablet, Rfl: 1 .  azelastine (ASTELIN) 0.1 % nasal spray, 2 sprays 2 (two) times  daily., Disp: , Rfl: 6 .  budesonide (ENTOCORT EC) 3 MG 24 hr capsule, Take 6 mg by mouth daily., Disp: , Rfl:  .  budesonide-formoterol (SYMBICORT) 160-4.5 MCG/ACT inhaler, Inhale 2 puffs into the lungs 2 (two) times daily., Disp: , Rfl:  .  EPINEPHrine 0.3 mg/0.3 mL IJ SOAJ injection, epinephrine 0.3 mg/0.3 mL injection, auto-injector  TK UTD PRN, Disp: , Rfl:  .  exemestane (AROMASIN) 25 MG tablet, Take 1 tablet (25 mg total) by mouth daily after breakfast., Disp: 90 tablet, Rfl: 1 .  Hyoscyamine Sulfate  SL 0.125 MG SUBL, hyoscyamine 0.125 mg sublingual tablet  TAKE 1 TABLET (0.125 MG TOTAL) BY MOUTH EVERY 6 HOURS AS NEEDED FOR UP TO 30 DAYS FOR CRAMPING., Disp: , Rfl:  .  lansoprazole (PREVACID) 30 MG capsule, Take 30 mg by mouth daily at 12 noon., Disp: , Rfl:  .  levalbuterol (XOPENEX HFA) 45 MCG/ACT inhaler, Inhale 2 puffs into the lungs every 4 (four) hours as needed for wheezing (Q4-6 Hours PRN)., Disp: , Rfl:  .  levothyroxine (SYNTHROID) 88 MCG tablet, TAKE 1 TABLET BY MOUTH EVERY DAY, Disp: 90 tablet, Rfl: 1 .  mesalamine (LIALDA) 1.2 G EC tablet, Take 2.4 g by mouth daily., Disp: , Rfl:  .  metFORMIN (GLUCOPHAGE) 500 MG tablet, Take 1 tablet (500 mg total) by mouth 2 (two) times daily with a meal., Disp: 180 tablet, Rfl: 3 .  montelukast (SINGULAIR) 10 MG tablet, Take 10 mg by mouth at bedtime. , Disp: , Rfl:  .  olmesartan (BENICAR) 40 MG tablet, Take 1 tablet (40 mg total) by mouth daily., Disp: 90 tablet, Rfl: 1 .  promethazine (PHENERGAN) 25 MG tablet, TAKE 1/2-1 TABLET DAILY AS NEEDED FOR NAUSEA, Disp: 30 tablet, Rfl: 5 .  rosuvastatin (CRESTOR) 20 MG tablet, Take 1 tablet (20 mg total) by mouth daily., Disp: 90 tablet, Rfl: 3 .  triamcinolone (NASACORT ALLERGY 24HR) 55 MCG/ACT AERO nasal inhaler, Place 1 spray into the nose daily., Disp: , Rfl:  .  UCERIS 2 MG/ACT FOAM, SMARTSIG:1 Unspecified Rectally Twice Daily, Disp: , Rfl:  .  venlafaxine XR (EFFEXOR-XR) 150 MG 24 hr capsule,  TAKE 1 BY MOUTH EVERY DAY WITH BREAKFAST, Disp: 90 capsule, Rfl: 1 .  zolpidem (AMBIEN) 5 MG tablet, TAKE 1 TABLET BY MOUTH AT BEDTIME AS NEEDED FOR SLEEP, Disp: 30 tablet, Rfl: 3 .  HYDROcodone-acetaminophen (NORCO/VICODIN) 5-325 MG tablet, Take 1 tablet by mouth every 6 (six) hours as needed for severe pain. For AFTER surgery, do not take and drive, Disp: 10 tablet, Rfl: 0  Review of Systems: Denies appetite changes, fevers, chills, fatigue, unexplained weight changes. Denies hearing loss, neck lumps or masses, mouth sores, ringing in ears or voice changes. Denies cough or wheezing.  Denies shortness of breath. Denies chest pain or palpitations. Denies leg swelling. Denies abdominal distention, blood in stools, constipation, nausea, vomiting, or early satiety. Denies pain with intercourse, dysuria, frequency, hematuria or incontinence. Denies hot flashes, pelvic pain, vaginal bleeding or vaginal discharge.   Denies joint pain, back pain or muscle pain/cramps. Denies itching, rash, or wounds. Denies dizziness, headaches, numbness or seizures. Denies swollen lymph nodes or glands, denies easy bruising or bleeding. Denies anxiety, depression, confusion, or decreased concentration.  Physical Exam: BP 133/71 (BP Location: Right Arm, Patient Position: Sitting)   Pulse 95   Temp 98.2 F (36.8 C) (Temporal)   Resp 18   Ht 5' 3.5" (1.613 m)   Wt 174 lb 4 oz (79 kg)   BMI 30.38 kg/m  General: Alert, oriented, no acute distress. HEENT: Cephalic, atraumatic, sclera anicteric. Chest: Clear to auscultation bilaterally.  No wheezes, rhonchi or rales. Cardiovascular: Regular rate and rhythm, no murmurs. Abdomen: soft, nontender.  Normoactive bowel sounds.  No masses or hepatosplenomegaly appreciated.   GU: Normal appearing external genitalia without erythema, excoriation, or lesions.  Speculum exam reveals normal-appearing cervix, no masses or lesions noted.  Bimanual exam reveals small mobile  uterus, no adnexal masses.    Laboratory & Radiologic Studies: None new  Assessment & Plan:  Erin Fields is a 54 y.o. woman estrogen receptor positive breast cancer on Exemestane.  The patient is ready to move forward with therapeutic and prophylactic BSO in the setting of personal history of estrogen receptor positive breast cancer.  She is having her second Covid vaccine at the end of the month and we will schedule her surgery (robotic BSO) for 2 weeks after that.  Tentatively plan for surgery on May 10.  We discussed the plan for a robotic assisted bilateral salpingo-oophorectomy, possible laparotomy. The risks of surgery were discussed in detail and she understands these to include infection; wound separation; hernia; injury to adjacent organs such as bowel, bladder, blood vessels, ureters and nerves; bleeding which may require blood transfusion; anesthesia risk; thromboembolic events; possible death; unforeseen complications; possible need for re-exploration; medical complications such as heart attack, stroke, pleural effusion and pneumonia. The patient will receive DVT and antibiotic prophylaxis as indicated. She voiced a clear understanding. She had the opportunity to ask questions. Perioperative instructions were reviewed with her. Prescriptions for post-op medications were sent to her pharmacy of choice.  20 minutes of total time was spent for this patient encounter, including preparation, face-to-face counseling with the patient and coordination of care, and documentation of the encounter.  Jeral Pinch, MD  Division of Gynecologic Oncology  Department of Obstetrics and Gynecology  [facility]

## 2019-07-31 NOTE — Patient Instructions (Addendum)
Preparing for your Surgery  Plan for surgery on Aug 29, 2019 with Dr. Jeral Pinch at [facility]. You will be scheduled for a robotic assisted laparoscopic bilateral salpingo-oophorectomy (removal of both fallopian tubes and ovaries).   Pre-operative Testing -You will receive a phone call from presurgical testing at [facility] to arrange for a pre-op appointment over the phone, lab appointment, and COVID test. The COVID test normally happens 3 days prior to the surgery and they ask that you self quarantine yourself until surgery to decrease chance of exposure.   -Bring your insurance card, copy of an advanced directive if applicable, medication list  -At that visit, you will be asked to sign a consent for a possible blood transfusion in case a transfusion becomes necessary during surgery.  The need for a blood transfusion is rare but having consent is a necessary part of your care.     -You should not be taking blood thinners or aspirin at least ten days prior to surgery unless instructed by your surgeon.  -Do not take supplements such as fish oil (omega 3), red yeast rice, turmeric before your surgery.   Day Before Surgery at [place] will be asked to take in a light diet the day before surgery.  Avoid carbonated beverages.  You will be advised that you can have clear liquids up until 3 hours before your scheduled surgery time.  Eat a light diet the day before surgery.  Examples including soups, broths, toast, yogurt, mashed potatoes.  Things to avoid include carbonated beverages (fizzy beverages), raw fruits and raw vegetables, or beans.   If your bowels are filled with gas, your surgeon will have difficulty visualizing your pelvic organs which increases your surgical risks.  Your role in recovery Your role is to become active as soon as directed by your doctor, while still giving yourself time to heal.  Rest when you feel tired. You will be asked to do  the following in order to speed your recovery:  - Cough and breathe deeply. This helps to clear and expand your lungs and can prevent pneumonia after surgery.  - [place]. Do mild physical activity. Walking or moving your legs help your circulation and body functions return to normal. Do not try to get up or walk alone the first time after surgery.   -If you develop swelling on one leg or the other, pain in the back of your leg, redness/warmth in one of your legs, please call the office or go to the Emergency Room to have a doppler to rule out a blood clot. For shortness of breath, chest pain-seek care in the Emergency Room as soon as possible. - Actively manage your pain. Managing your pain lets you move in comfort. We will ask you to rate your pain on a scale of zero to 10. It is your responsibility to tell your doctor or nurse where and how much you hurt so your pain can be treated.  Special Considerations -If you are diabetic, you may be placed on insulin after surgery to have closer control over your blood sugars to promote healing and recovery.  This does not mean that you will be discharged on insulin.  If applicable, your oral antidiabetics will be resumed when you are tolerating a solid diet.  -Your final pathology results from surgery should be available around one week after surgery and the results will be relayed to you when available.  -FMLA  forms can be faxed to [phone] and please allow 5-7 business days for completion.  Pain Management After Surgery -You have been prescribed your pain medication before surgery so that you can have these available when you are discharged from the hospital. The pain medication is for use ONLY AFTER surgery and a new prescription will not be given.   -Make sure that you have Tylenol and Ibuprofen at home to use on a regular basis after surgery for pain control. We recommend alternating the medications every hour to six hours  since they work differently and are processed in the body differently for pain relief.  -Review the attached handout on narcotic use and their risks and side effects.   Blood Transfusion Information (For the consent to be signed before surgery)  We will be checking your blood type before surgery so in case of emergencies, we will know what type of blood you would need.                                            WHAT IS A BLOOD TRANSFUSION?  A transfusion is the replacement of blood or some of its parts. Blood is made up of multiple cells which provide different functions.  Red blood cells carry oxygen and are used for blood loss replacement.  White blood cells fight against infection.  Platelets control bleeding.  Plasma helps clot blood.  Other blood products are available for specialized needs, such as hemophilia or other clotting disorders. BEFORE THE TRANSFUSION  Who gives blood for transfusions?   You may be able to donate blood to be used at a later date on yourself (autologous donation).  Relatives can be asked to donate blood. This is generally not any safer than if you have received blood from a stranger. The same precautions are taken to ensure safety when a relative's blood is donated.  Healthy volunteers who are fully evaluated to make sure their blood is safe. This is blood bank blood. Transfusion therapy is the safest it has ever been in the practice of medicine. Before blood is taken from a donor, a complete history is taken to make sure that person has no history of diseases nor engages in risky social behavior (examples are intravenous drug use or sexual activity with multiple partners). The donor's travel history is screened to minimize risk of transmitting infections, such as malaria. The donated blood is tested for signs of infectious diseases, such as HIV and hepatitis. The blood is then tested to be sure it is compatible with you in order to minimize the chance of a  transfusion reaction. If you or a relative donates blood, this is often done in anticipation of surgery and is not appropriate for emergency situations. It takes many days to process the donated blood. RISKS AND COMPLICATIONS Although transfusion therapy is very safe and saves many lives, the main dangers of transfusion include:   Getting an infectious disease.  Developing a transfusion reaction. This is an allergic reaction to something in the blood you were given. Every precaution is taken to prevent this. The decision to have a blood transfusion has been considered carefully by your caregiver before blood is given. Blood is not given unless the benefits outweigh the risks.  AFTER SURGERY INSTRUCTIONS  Return to work: 4-6 weeks if applicable  Activity: 1. Be up and out of the bed during  the day.  Take a nap if needed.  You may walk up steps but be careful and use the hand rail.  Stair climbing will tire you more than you think, you may need to stop part way and rest.   2. No lifting or straining for 6 weeks over 10 pounds. No pushing, pulling, straining for 6 weeks.  3. No driving for around 1 week(s) or sooner if you are not taking pain medication and your reaction time is back to normal.  Do not drive if you are taking narcotic pain medicine.   4. You can shower as soon as the next day after surgery. Shower daily.  Use soap and water on your incision and pat dry; don't rub.  No tub baths or submerging your body in water until cleared by your surgeon. If you have the soap that was given to you by pre-surgical testing that was used before surgery, you do not need to use it afterwards because this can irritate your incisions.   5. No sexual activity and nothing in the vagina for 2 weeks.  6. You may experience a small amount of clear drainage from your incisions, which is normal.  If the drainage persists, increases, or changes color please call the office.  7. Do not use creams, lotions,  or ointments such as neosporin on your incisions after surgery until advised by your surgeon because they can cause removal of the dermabond glue on your incisions.    8. You may experience vaginal spotting after surgery. The spotting is normal but if you experience heavy bleeding, call our office.  9. Take Tylenol or ibuprofen first for pain and only use narcotic pain medication for severe pain not relieved by the Tylenol or Ibuprofen.  Monitor your Tylenol intake to a max of 4,000 mg.  Diet: 1. Low sodium Heart Healthy Diet is recommended.  2. It is safe to use a laxative, such as Miralax or Colace, if you have difficulty moving your bowels. You can take Sennakot at bedtime every evening to keep bowel movements regular and to prevent constipation.    Wound Care: 1. Keep clean and dry.  Shower daily.  Reasons to call the Doctor:  Fever - Oral temperature greater than 100.4 degrees Fahrenheit  Foul-smelling vaginal discharge  Difficulty urinating  Nausea and vomiting  Increased pain at the site of the incision that is unrelieved with pain medicine.  Difficulty breathing with or without chest pain  New calf pain especially if only on one side  Sudden, continuing increased vaginal bleeding with or without clots.   Contacts: For questions or concerns you should contact:  Dr. Jeral Pinch at [phone]  Joylene John, NP at [phone]  After Hours: call [phone] and have the GYN Oncologist paged/contacted

## 2019-07-31 NOTE — Progress Notes (Signed)
Gynecologic Oncology Return Clinic Visit  4/15   Reason for Visit: Follow-up discussion regarding therapeutic BSO in the setting of estrogen positive breast cancer  Treatment History: Oncology History Overview Note  Cancer Staging Malignant neoplasm of upper-outer quadrant of right breast in female, estrogen receptor positive ([place]) Staging form: Breast, AJCC 8th Edition - Clinical stage from 03/16/2017: Stage IA (cT1b, cN0, cM0, G2, ER: Positive, PR: Positive, HER2: Negative) - Signed by [name], MD on 03/28/2017 - Pathologic stage from 05/15/2017: Stage IA (pT2, pN0, cM0, G2, ER+, PR+, HER2-, Oncotype DX score: 3) - Signed by [name], MD on 08/06/2017     Malignant neoplasm of upper-outer quadrant of right breast in female, estrogen receptor positive ([place])  03/15/2017 Mammogram   Diagnostic Mammogram Right Breast  IMPRESSION: Palpable abnormality corresponds to distortion and focal mass associated acoustic shadowing measuring 0.8x0.9x0.9cm in the 930 o'clock location of the right breast 7 cm from the nipple.   RECOMMENDATION: Ultrasound-guided core biopsy is recommended and scheduled for the patient.   03/16/2017 Initial Biopsy   Biopsy Results  Diagnosis 03/16/17 Breast, right, needle core biopsy, 9:30 o'clock, ribbon clip placed - INVASIVE MAMMARY CARCINOMA. - SEE COMMENT.    03/16/2017 Receptors her2   Estrogen Receptor: 95%, POSITIVE, STRONG STAINING INTENSITY Progesterone Receptor: 95%, POSITIVE, STRONG STAINING INTENSITY Proliferation Marker Ki67: 3% HER2: NEGATIVE   03/20/2017 Initial Diagnosis   Malignant neoplasm of upper-outer quadrant of right breast in female, estrogen receptor positive ([place])   04/24/2017 Surgery   Right Breast Lumpectomy with Sentinel Node Biopsy with Dr. [name]    04/24/2017 Pathology Results   Diagnosis 04/24/17 1. Breast, lumpectomy, Right w/seed - INVASIVE LOBULAR CARCINOMA, GRADE 2, SPANNING 2.2 CM. - LOBULAR NEOPLASIA (ATYPICAL  LOBULAR HYPERPLASIA). - ANTERIOR SUPERIOR MARGIN IS BROADLY POSITIVE. - BIOPSY SITE. - SEE ONCOLOGY TABLE. 2. Breast, excision, Right additional Medial Margin - INVASIVE LOBULAR CARCINOMA. - LOBULAR CARCINOMA IN SITU. - INVASIVE CARCINOMA IS 0.3 CM FROM NEW MEDIAL MARGIN. 3. Lymph node, sentinel, biopsy, Right Axillary #1 - ISOLATED TUMOR CELLS IN ONE OF ONE LYMPH NODES (0/1). 4. Lymph node, sentinel, biopsy, Right Axillary #2 - ONE OF ONE LYMPH NODES NEGATIVE FOR CARCINOMA (0/1). 5. Lymph node, sentinel, biopsy, Right Axillary #3 - ONE OF ONE LYMPH NODES NEGATIVE FOR CARCINOMA (0/1).   04/24/2017 Oncotype testing   Her Oncotyple recurrence score was 3, her distant recurrence score with Tamoxifen was 3% and her adjuvant chemotherapy benefit was <1%.    05/15/2017 Surgery   Re-excision of Right Breast Lumpectomy with Dr. [name]    05/15/2017 Pathology Results   Diagnosis 05/15/17 Breast, excision, Right additional anterior margin - INVASIVE LOBULAR CARCINOMA. - ATYPICAL LOBULAR HYPERPLASIA. - THE NEW ANTERIOR SURGICAL RESECTION MARGIN IS NEGATIVE FOR CARCINOMA.   05/15/2017 Cancer Staging   Staging form: Breast, AJCC 8th Edition - Pathologic stage from 05/15/2017: Stage IA (pT2, pN0, cM0, G2, ER+, PR+, HER2-, Oncotype DX score: 3) - Signed by [name], MD on 08/06/2017   06/26/2017 -  Radiation Therapy   Adjuvant RT with Dr. [name]   07/12/2017 Genetic Testing   Negative genetic testing on the multi-cancer panel.  The Multi-Gene Panel offered by [name] includes sequencing and/or deletion duplication testing of the following 83 genes: ALK, APC, ATM, AXIN2,BAP1,  BARD1, BLM, BMPR1A, BRCA1, BRCA2, BRIP1, CASR, CDC73, CDH1, CDK4, CDKN1B, CDKN1C, CDKN2A (p14ARF), CDKN2A (p16INK4a), CEBPA, CHEK2, CTNNA1, DICER1, DIS3L2, EGFR (c.2369C>T, p.Thr790Met variant only), EPCAM (Deletion/duplication testing only), FH, FLCN, GATA2, GPC3, GREM1 (Promoter region  deletion/duplication testing only), HOXB13  (c.251G>A, p.Gly84Glu), HRAS, KIT, MAX, MEN1, MET, MITF (c.952G>A, p.Glu318Lys variant only), MLH1, MSH2, MSH3, MSH6, MUTYH, NBN, NF1, NF2, NTHL1, PALB2, PDGFRA, PHOX2B, PMS2, POLD1, POLE, POT1, PRKAR1A, PTCH1, PTEN, RAD50, RAD51C, RAD51D, RB1, RECQL4, RET, RUNX1, SDHAF2, SDHA (sequence changes only), SDHB, SDHC, SDHD, SMAD4, SMARCA4, SMARCB1, SMARCE1, STK11, SUFU, TERT, TERT, TMEM127, TP53, TSC1, TSC2, VHL, WRN and WT1.  The report date is July 12, 2017.    08/13/2017 Imaging   08/13/2017 DEXA ASSESSMENT: The BMD measured at AP Spine L1-L4 is 1.120 g/cm2 with a T-score of -0.5. This patient is considered normal according to [facility]) criteria.    08/2017 -  Anti-estrogen oral therapy   Letrozole daily, changed to Exemestane due to poor tolerance of Letrozole   01/16/2018 Imaging   01/16/2018 CT Chest IMPRESSION: Stable 2 mm RIGHT upper lobe nodule.  Post radiation therapy changes at the anterolateral RIGHT upper lobe.  Post RIGHT breast surgery and RIGHT axillary node dissection.  No new abnormalities.  Aortic Atherosclerosis (ICD10-I70.0).   08/09/2018 Mammogram   IMPRESSION: 1. No mammographic evidence of malignancy involving either breast. 2. Expected post lumpectomy and post radiation changes involving the RIGHT breast.     Interval History: Patient has overall been doing well since her last visit with me.  She continues to struggle with her inflammatory bowel disease which has worsened some.  She has significant diarrhea and associated cramping/pain.  She is set to start Humira shortly after she gets her second Covid vaccine on 4/27.  Otherwise she denies any pelvic pain, vaginal bleeding or discharge.  She denies any urinary symptoms.  She reports having a good appetite without any nausea or emesis.  Past Medical/Surgical History: Past Medical History:  Diagnosis Date  . Allergy   . Anxiety   . Asthma   . Cancer [facility])    right breast cancer   . Chicken pox   . Colon polyps   . Depression   . Elevated blood pressure   . Environmental allergies   . Family history of cancer   . Frequent headaches   . GERD (gastroesophageal reflux disease)   . Hay fever   . Hyperlipidemia   . Hypertension   . Hypothyroidism   . Migraines   . Personal history of radiation therapy   . PONV (postoperative nausea and vomiting)   . Thyroid disease   . Ulcerative colitis ([place])   . Varicose veins     Past Surgical History:  Procedure Laterality Date  . ABLATION  2011  . BREAST BIOPSY Right 02/2017  . BREAST LUMPECTOMY Right 04/2017  . BREAST LUMPECTOMY WITH RADIOACTIVE SEED AND SENTINEL LYMPH NODE BIOPSY Right 04/24/2017   Procedure: RIGHT BREAST LUMPECTOMY WITH RADIOACTIVE SEED AND SENTINEL LYMPH NODE BIOPSY;  Surgeon: [name], MD;  Location: [place];  Service: General;  Laterality: Right;  . ENDOMETRIAL ABLATION  2010   Had abnormal uterine bleeding (heavy and frequent), performed in [place], menses stopped completely 2 years after  . EXPLORATORY LAPAROTOMY     Had exploratory surgery at the age of 69 for abdominal pain with cyst found on her left ovary, not treated surgically  . FOOT SURGERY Right 2012  . HAND SURGERY Right   . RE-EXCISION OF BREAST LUMPECTOMY Right 05/15/2017   Procedure: RE-EXCISION OF BREAST LUMPECTOMY;  Surgeon: [name], MD;  Location: [address];  Service: General;  Laterality: Right;  . Septoplasty with turbinate reduction    .  TONSILLECTOMY  1980  . VEIN SURGERY     Removal Right Leg  . WISDOM TOOTH EXTRACTION      Family History  Problem Relation Age of Onset  . Arthritis Mother 34       Deceased  . Breast cancer Mother   . Lung cancer Mother   . Hyperlipidemia Mother   . Heart disease Mother   . Hypertension Mother   . Diabetes Mother   . Crohn's disease Mother   . Diabetes Maternal Grandfather   . Heart disease Maternal Grandfather   . Heart attack  Maternal Grandfather   . Leukemia Maternal Grandfather   . Colitis Sister   . Leukemia Cousin 3       mat cousin  . Lung cancer Father   . Brain cancer Father     Social History   Socioeconomic History  . Marital status: Widowed    Spouse name: Not on file  . Number of children: Not on file  . Years of education: Not on file  . Highest education level: Not on file  Occupational History  . Not on file  Tobacco Use  . Smoking status: Former Smoker    Packs/day: 4.00    Years: 15.00    Pack years: 60.00    Types: Cigarettes    Quit date: 09/14/1993    Years since quitting: 25.8  . Smokeless tobacco: Never Used  . Tobacco comment: Quit >20 yrs ago  Substance and Sexual Activity  . Alcohol use: Yes    Comment: Drinks a couple every 2-3 months  . Drug use: No  . Sexual activity: Not Currently    Comment: ablation  Other Topics Concern  . Not on file  Social History Narrative  . Not on file   Social Determinants of Health   Financial Resource Strain:   . Difficulty of Paying Living Expenses:   Food Insecurity:   . Worried About Charity fundraiser in the Last Year:   . Arboriculturist in the Last Year:   Transportation Needs:   . Film/video editor (Medical):   Marland Kitchen Lack of Transportation (Non-Medical):   Physical Activity:   . Days of Exercise per Week:   . Minutes of Exercise per Session:   Stress:   . Feeling of Stress :   Social Connections:   . Frequency of Communication with Friends and Family:   . Frequency of Social Gatherings with Friends and Family:   . Attends Religious Services:   . Active Member of Clubs or Organizations:   . Attends Archivist Meetings:   Marland Kitchen Marital Status:     Current Medications:  Current Outpatient Medications:  .  acyclovir (ZOVIRAX) 400 MG tablet, TAKE 1 TABLET (400 MG TOTAL) BY MOUTH 3 (THREE) TIMES DAILY AS NEEDED., Disp: 270 tablet, Rfl: 1 .  azelastine (ASTELIN) 0.1 % nasal spray, 2 sprays 2 (two) times  daily., Disp: , Rfl: 6 .  budesonide (ENTOCORT EC) 3 MG 24 hr capsule, Take 6 mg by mouth daily., Disp: , Rfl:  .  budesonide-formoterol (SYMBICORT) 160-4.5 MCG/ACT inhaler, Inhale 2 puffs into the lungs 2 (two) times daily., Disp: , Rfl:  .  EPINEPHrine 0.3 mg/0.3 mL IJ SOAJ injection, epinephrine 0.3 mg/0.3 mL injection, auto-injector  TK UTD PRN, Disp: , Rfl:  .  exemestane (AROMASIN) 25 MG tablet, Take 1 tablet (25 mg total) by mouth daily after breakfast., Disp: 90 tablet, Rfl: 1 .  Hyoscyamine Sulfate  SL 0.125 MG SUBL, hyoscyamine 0.125 mg sublingual tablet  TAKE 1 TABLET (0.125 MG TOTAL) BY MOUTH EVERY 6 HOURS AS NEEDED FOR UP TO 30 DAYS FOR CRAMPING., Disp: , Rfl:  .  lansoprazole (PREVACID) 30 MG capsule, Take 30 mg by mouth daily at 12 noon., Disp: , Rfl:  .  levalbuterol (XOPENEX HFA) 45 MCG/ACT inhaler, Inhale 2 puffs into the lungs every 4 (four) hours as needed for wheezing (Q4-6 Hours PRN)., Disp: , Rfl:  .  levothyroxine (SYNTHROID) 88 MCG tablet, TAKE 1 TABLET BY MOUTH EVERY DAY, Disp: 90 tablet, Rfl: 1 .  mesalamine (LIALDA) 1.2 G EC tablet, Take 2.4 g by mouth daily., Disp: , Rfl:  .  metFORMIN (GLUCOPHAGE) 500 MG tablet, Take 1 tablet (500 mg total) by mouth 2 (two) times daily with a meal., Disp: 180 tablet, Rfl: 3 .  montelukast (SINGULAIR) 10 MG tablet, Take 10 mg by mouth at bedtime. , Disp: , Rfl:  .  olmesartan (BENICAR) 40 MG tablet, Take 1 tablet (40 mg total) by mouth daily., Disp: 90 tablet, Rfl: 1 .  promethazine (PHENERGAN) 25 MG tablet, TAKE 1/2-1 TABLET DAILY AS NEEDED FOR NAUSEA, Disp: 30 tablet, Rfl: 5 .  rosuvastatin (CRESTOR) 20 MG tablet, Take 1 tablet (20 mg total) by mouth daily., Disp: 90 tablet, Rfl: 3 .  triamcinolone (NASACORT ALLERGY 24HR) 55 MCG/ACT AERO nasal inhaler, Place 1 spray into the nose daily., Disp: , Rfl:  .  UCERIS 2 MG/ACT FOAM, SMARTSIG:1 Unspecified Rectally Twice Daily, Disp: , Rfl:  .  venlafaxine XR (EFFEXOR-XR) 150 MG 24 hr capsule,  TAKE 1 BY MOUTH EVERY DAY WITH BREAKFAST, Disp: 90 capsule, Rfl: 1 .  zolpidem (AMBIEN) 5 MG tablet, TAKE 1 TABLET BY MOUTH AT BEDTIME AS NEEDED FOR SLEEP, Disp: 30 tablet, Rfl: 3 .  HYDROcodone-acetaminophen (NORCO/VICODIN) 5-325 MG tablet, Take 1 tablet by mouth every 6 (six) hours as needed for severe pain. For AFTER surgery, do not take and drive, Disp: 10 tablet, Rfl: 0  Review of Systems: Denies appetite changes, fevers, chills, fatigue, unexplained weight changes. Denies hearing loss, neck lumps or masses, mouth sores, ringing in ears or voice changes. Denies cough or wheezing.  Denies shortness of breath. Denies chest pain or palpitations. Denies leg swelling. Denies abdominal distention, blood in stools, constipation, nausea, vomiting, or early satiety. Denies pain with intercourse, dysuria, frequency, hematuria or incontinence. Denies hot flashes, pelvic pain, vaginal bleeding or vaginal discharge.   Denies joint pain, back pain or muscle pain/cramps. Denies itching, rash, or wounds. Denies dizziness, headaches, numbness or seizures. Denies swollen lymph nodes or glands, denies easy bruising or bleeding. Denies anxiety, depression, confusion, or decreased concentration.  Physical Exam: BP 133/71 (BP Location: Right Arm, Patient Position: Sitting)   Pulse 95   Temp 98.2 F (36.8 C) (Temporal)   Resp 18   Ht 5' 3.5" (1.613 m)   Wt 174 lb 4 oz (79 kg)   BMI 30.38 kg/m  General: Alert, oriented, no acute distress. HEENT: Cephalic, atraumatic, sclera anicteric. Chest: Clear to auscultation bilaterally.  No wheezes, rhonchi or rales. Cardiovascular: Regular rate and rhythm, no murmurs. Abdomen: soft, nontender.  Normoactive bowel sounds.  No masses or hepatosplenomegaly appreciated.   GU: Normal appearing external genitalia without erythema, excoriation, or lesions.  Speculum exam reveals normal-appearing cervix, no masses or lesions noted.  Bimanual exam reveals small mobile  uterus, no adnexal masses.    Laboratory & Radiologic Studies: None new  Assessment & Plan:  Erin Fields is a 54 y.o. woman estrogen receptor positive breast cancer on Exemestane.  The patient is ready to move forward with therapeutic and prophylactic BSO in the setting of personal history of estrogen receptor positive breast cancer.  She is having her second Covid vaccine at the end of the month and we will schedule her surgery (robotic BSO) for 2 weeks after that.  Tentatively plan for surgery on May 10.  We discussed the plan for a robotic assisted bilateral salpingo-oophorectomy, possible laparotomy. The risks of surgery were discussed in detail and she understands these to include infection; wound separation; hernia; injury to adjacent organs such as bowel, bladder, blood vessels, ureters and nerves; bleeding which may require blood transfusion; anesthesia risk; thromboembolic events; possible death; unforeseen complications; possible need for re-exploration; medical complications such as heart attack, stroke, pleural effusion and pneumonia. The patient will receive DVT and antibiotic prophylaxis as indicated. She voiced a clear understanding. She had the opportunity to ask questions. Perioperative instructions were reviewed with her. Prescriptions for post-op medications were sent to her pharmacy of choice.  20 minutes of total time was spent for this patient encounter, including preparation, face-to-face counseling with the patient and coordination of care, and documentation of the encounter.  Jeral Pinch, MD  Division of Gynecologic Oncology  Department of Obstetrics and Gynecology  [facility]

## 2019-08-12 ENCOUNTER — Telehealth: Payer: Self-pay | Admitting: Nurse Practitioner

## 2019-08-12 NOTE — Telephone Encounter (Signed)
Scheduled appt per 4/26 sch message - unable to reach pt . Left message with apt date and time

## 2019-08-20 ENCOUNTER — Other Ambulatory Visit: Payer: Self-pay | Admitting: Family Medicine

## 2019-08-20 DIAGNOSIS — F5104 Psychophysiologic insomnia: Secondary | ICD-10-CM

## 2019-08-21 ENCOUNTER — Encounter: Payer: Self-pay | Admitting: Family Medicine

## 2019-08-21 NOTE — Telephone Encounter (Signed)
Last Zoplidem RX: 04/22/19, #30 x 3 RF Last OV: 06/18/19 Next OV: not scheduled UDS:  Not on file CSC:  Not on file

## 2019-08-22 ENCOUNTER — Encounter (HOSPITAL_BASED_OUTPATIENT_CLINIC_OR_DEPARTMENT_OTHER): Payer: Self-pay | Admitting: Gynecologic Oncology

## 2019-08-22 ENCOUNTER — Other Ambulatory Visit: Payer: Self-pay

## 2019-08-22 NOTE — Progress Notes (Signed)
Spoke w/ via phone for pre-op interview---patient Lab needs dos----  none            Lab results------lab appt 4-11-201 at 100 pm for cbc, cmet, ua, type and screen, ekg COVID test ------08-26-2019 1400 pm Arrive at -------530 am 08-29-2019  No food after midnight clear liquids from midnight until 430 am then npo Medications to take morning of surgery -----symbicort, xopenex inhaler pnr/bring inhaler, venlafaxine, aromasin, levothyroxine, prevacid, nasal sprays Diabetic medication -----n/a Patient Special Instructions -----light diet day before no gas producing food and no carbonated beverages Pre-Op special Istructions -----none Patient verbalized understanding of instructions that were given at this phone interview. Patient denies shortness of breath, chest pain, fever, cough a this phone interview.

## 2019-08-26 ENCOUNTER — Telehealth: Payer: Self-pay

## 2019-08-26 ENCOUNTER — Encounter (HOSPITAL_COMMUNITY)
Admission: RE | Admit: 2019-08-26 | Discharge: 2019-08-26 | Disposition: A | Discharge status: Home / Self Care | Payer: 59 | Source: Ambulatory Visit | Attending: Gynecologic Oncology | Admitting: Gynecologic Oncology

## 2019-08-26 ENCOUNTER — Other Ambulatory Visit (HOSPITAL_COMMUNITY)
Admission: RE | Admit: 2019-08-26 | Discharge: 2019-08-26 | Disposition: A | Discharge status: Home / Self Care | Payer: 59 | Source: Ambulatory Visit | Attending: Gynecologic Oncology | Admitting: Gynecologic Oncology

## 2019-08-26 ENCOUNTER — Other Ambulatory Visit: Payer: Self-pay

## 2019-08-26 DIAGNOSIS — Z20822 Contact with and (suspected) exposure to covid-19: Secondary | ICD-10-CM | POA: Insufficient documentation

## 2019-08-26 DIAGNOSIS — Z01818 Encounter for other preprocedural examination: Secondary | ICD-10-CM | POA: Insufficient documentation

## 2019-08-26 LAB — URINALYSIS, ROUTINE W REFLEX MICROSCOPIC
Bilirubin Urine: NEGATIVE
Glucose, UA: NEGATIVE mg/dL
Hgb urine dipstick: NEGATIVE
Ketones, ur: NEGATIVE mg/dL
Leukocytes,Ua: NEGATIVE
Nitrite: NEGATIVE
Protein, ur: NEGATIVE mg/dL
Specific Gravity, Urine: 1.002 — ABNORMAL LOW (ref 1.005–1.030)
pH: 6 (ref 5.0–8.0)

## 2019-08-26 LAB — CBC
HCT: 40.2 % (ref 36.0–46.0)
Hemoglobin: 12.9 g/dL (ref 12.0–15.0)
MCH: 28.4 pg (ref 26.0–34.0)
MCHC: 32.1 g/dL (ref 30.0–36.0)
MCV: 88.5 fL (ref 80.0–100.0)
Platelets: 509 10*3/uL — ABNORMAL HIGH (ref 150–400)
RBC: 4.54 MIL/uL (ref 3.87–5.11)
RDW: 13.8 % (ref 11.5–15.5)
WBC: 14.3 10*3/uL — ABNORMAL HIGH (ref 4.0–10.5)
nRBC: 0 % (ref 0.0–0.2)

## 2019-08-26 LAB — BASIC METABOLIC PANEL
Anion gap: 10 (ref 5–15)
BUN: 8 mg/dL (ref 6–20)
CO2: 27 mmol/L (ref 22–32)
Calcium: 9.8 mg/dL (ref 8.9–10.3)
Chloride: 105 mmol/L (ref 98–111)
Creatinine, Ser: 0.67 mg/dL (ref 0.44–1.00)
GFR calc Af Amer: >60 mL/min (ref >60)
GFR calc non Af Amer: >60 mL/min (ref >60)
Glucose, Bld: 128 mg/dL — ABNORMAL HIGH (ref 70–99)
Potassium: 4.1 mmol/L (ref 3.5–5.1)
Sodium: 142 mmol/L (ref 135–145)

## 2019-08-26 LAB — ABO/RH: ABO/RH(D): O POS

## 2019-08-26 LAB — SARS CORONAVIRUS 2 (TAT 6-24 HRS): SARS Coronavirus 2: NEGATIVE

## 2019-08-26 NOTE — Telephone Encounter (Signed)
-----   Message from Dorothyann Gibbs, NP sent at 08/26/2019  3:58 PM EDT ----- Can you contact patient and rule out signs of infection (urinary,resp etc) given elevated white count? Thank you

## 2019-08-26 NOTE — Telephone Encounter (Signed)
Erin Fields states that she does not have any respiratory or urinary symptoms at this time.  She did state that her colitis is very very active.  Pt wants to know is she needs to let her GI doctor know her WBC count. Information given to [facility], to review.

## 2019-08-26 NOTE — Telephone Encounter (Signed)
Told Erin Fields that her surgery time is now 0830 am.  She needs to arrive at the surgery center at [facility] on 08-29-19. She can have clear liquids until 0530 then NPO. Pt verbalized understanding.

## 2019-08-27 ENCOUNTER — Encounter: Payer: Self-pay | Admitting: Hematology

## 2019-08-27 ENCOUNTER — Encounter: Payer: Self-pay | Admitting: *Deleted

## 2019-08-27 ENCOUNTER — Encounter: Payer: Self-pay | Admitting: Gynecologic Oncology

## 2019-08-27 ENCOUNTER — Telehealth: Payer: Self-pay | Admitting: Gynecologic Oncology

## 2019-08-27 NOTE — Telephone Encounter (Signed)
Called patient to discuss upcoming surgery. Message sent via mychart was received and reviewed with Dr. Berline Lopes. Per Dr. Berline Lopes, proceeding with the surgery will be based on the patient's current status in regards to her UC.  If she is worn down and fatigued from her current situation, then she would recommend holding off on surgery so she can get on Humira.  If she is able to function and perform activities of daily living without major issue, then proceeding with the surgery would be reasonable but ultimately it would be the patient's decision.  The patient states she is "feeling fine except for having to go to the bathroom an awful lot." She has been dealing with these symptoms for a year.  She would like to proceed with the surgery and would like to know if she can take imodium in the am before surgery as a preventative measure.  Per Dr. Berline Lopes, she would like the patient to take two doses of imodium the morning of surgery. This information will be sent in mychart to patient.

## 2019-08-27 NOTE — Telephone Encounter (Signed)
Received note from GI and Melissa Cross,NP reviewing with Dr. Berline Lopes and patient.

## 2019-08-28 ENCOUNTER — Encounter (HOSPITAL_BASED_OUTPATIENT_CLINIC_OR_DEPARTMENT_OTHER): Payer: Self-pay | Admitting: Gynecologic Oncology

## 2019-08-28 ENCOUNTER — Telehealth: Payer: Self-pay

## 2019-08-28 NOTE — Anesthesia Preprocedure Evaluation (Addendum)
Anesthesia Evaluation  Patient identified by MRN, date of birth, ID band Patient awake    Reviewed: Allergy & Precautions, NPO status , Patient's Chart, lab work & pertinent test results  History of Anesthesia Complications (+) PONV and history of anesthetic complications  Airway Mallampati: II  TM Distance: >3 FB Neck ROM: Full    Dental no notable dental hx.    Pulmonary asthma , former smoker,    Pulmonary exam normal        Cardiovascular hypertension, Pt. on medications Normal cardiovascular exam     Neuro/Psych  Headaches, Anxiety Depression    GI/Hepatic Neg liver ROS, PUD, GERD  Controlled and Medicated,  Endo/Other  diabetes, Type 2, Oral Hypoglycemic AgentsHypothyroidism   Renal/GU negative Renal ROS  negative genitourinary   Musculoskeletal negative musculoskeletal ROS (+)   Abdominal   Peds  Hematology negative hematology ROS (+)   Anesthesia Other Findings Breast ca  Reproductive/Obstetrics negative OB ROS                            Anesthesia Physical Anesthesia Plan  ASA: II  Anesthesia Plan: General   Post-op Pain Management:    Induction: Intravenous  PONV Risk Score and Plan: 4 or greater and Treatment may vary due to age or medical condition, Ondansetron, Dexamethasone, Midazolam, Scopolamine patch - Pre-op, Propofol infusion and TIVA  Airway Management Planned: Oral ETT  Additional Equipment: None  Intra-op Plan:   Post-operative Plan: Extubation in OR  Informed Consent: I have reviewed the patients History and Physical, chart, labs and discussed the procedure including the risks, benefits and alternatives for the proposed anesthesia with the patient or authorized representative who has indicated his/her understanding and acceptance.     Dental advisory given  Plan Discussed with: CRNA  Anesthesia Plan Comments:        Anesthesia Quick  Evaluation

## 2019-08-28 NOTE — Telephone Encounter (Signed)
Called patient to see if she had any questions regarding her pre-op instructions from 5/7.  Patient said she understood the insturctions and does not have any questions at this time.

## 2019-08-29 ENCOUNTER — Ambulatory Visit (HOSPITAL_BASED_OUTPATIENT_CLINIC_OR_DEPARTMENT_OTHER): Payer: No Typology Code available for payment source | Admitting: Anesthesiology

## 2019-08-29 ENCOUNTER — Encounter (HOSPITAL_BASED_OUTPATIENT_CLINIC_OR_DEPARTMENT_OTHER)
Admission: RE | Disposition: A | Discharge status: Home / Self Care | Payer: Self-pay | Source: Home / Self Care | Attending: Gynecologic Oncology

## 2019-08-29 ENCOUNTER — Ambulatory Visit (HOSPITAL_BASED_OUTPATIENT_CLINIC_OR_DEPARTMENT_OTHER)
Admission: RE | Admit: 2019-08-29 | Discharge: 2019-08-29 | Disposition: A | Discharge status: Home / Self Care | Payer: No Typology Code available for payment source | Attending: Gynecologic Oncology | Admitting: Gynecologic Oncology

## 2019-08-29 ENCOUNTER — Encounter (HOSPITAL_BASED_OUTPATIENT_CLINIC_OR_DEPARTMENT_OTHER): Payer: Self-pay | Admitting: Gynecologic Oncology

## 2019-08-29 ENCOUNTER — Other Ambulatory Visit: Payer: Self-pay

## 2019-08-29 DIAGNOSIS — Z170 Estrogen receptor positive status [ER+]: Secondary | ICD-10-CM

## 2019-08-29 DIAGNOSIS — F419 Anxiety disorder, unspecified: Secondary | ICD-10-CM | POA: Diagnosis not present

## 2019-08-29 DIAGNOSIS — N736 Female pelvic peritoneal adhesions (postinfective): Secondary | ICD-10-CM | POA: Insufficient documentation

## 2019-08-29 DIAGNOSIS — F329 Major depressive disorder, single episode, unspecified: Secondary | ICD-10-CM | POA: Diagnosis not present

## 2019-08-29 DIAGNOSIS — Z79890 Hormone replacement therapy (postmenopausal): Secondary | ICD-10-CM | POA: Insufficient documentation

## 2019-08-29 DIAGNOSIS — Z853 Personal history of malignant neoplasm of breast: Secondary | ICD-10-CM

## 2019-08-29 DIAGNOSIS — Z7952 Long term (current) use of systemic steroids: Secondary | ICD-10-CM | POA: Diagnosis not present

## 2019-08-29 DIAGNOSIS — Z7951 Long term (current) use of inhaled steroids: Secondary | ICD-10-CM | POA: Diagnosis not present

## 2019-08-29 DIAGNOSIS — Z79811 Long term (current) use of aromatase inhibitors: Secondary | ICD-10-CM | POA: Insufficient documentation

## 2019-08-29 DIAGNOSIS — I10 Essential (primary) hypertension: Secondary | ICD-10-CM | POA: Diagnosis not present

## 2019-08-29 DIAGNOSIS — E785 Hyperlipidemia, unspecified: Secondary | ICD-10-CM | POA: Diagnosis not present

## 2019-08-29 DIAGNOSIS — Z87891 Personal history of nicotine dependence: Secondary | ICD-10-CM | POA: Diagnosis not present

## 2019-08-29 DIAGNOSIS — Z803 Family history of malignant neoplasm of breast: Secondary | ICD-10-CM | POA: Insufficient documentation

## 2019-08-29 DIAGNOSIS — K219 Gastro-esophageal reflux disease without esophagitis: Secondary | ICD-10-CM | POA: Insufficient documentation

## 2019-08-29 DIAGNOSIS — E039 Hypothyroidism, unspecified: Secondary | ICD-10-CM | POA: Diagnosis not present

## 2019-08-29 DIAGNOSIS — C50411 Malignant neoplasm of upper-outer quadrant of right female breast: Secondary | ICD-10-CM

## 2019-08-29 DIAGNOSIS — J45909 Unspecified asthma, uncomplicated: Secondary | ICD-10-CM | POA: Insufficient documentation

## 2019-08-29 DIAGNOSIS — Z7984 Long term (current) use of oral hypoglycemic drugs: Secondary | ICD-10-CM | POA: Insufficient documentation

## 2019-08-29 DIAGNOSIS — Z4002 Encounter for prophylactic removal of ovary: Secondary | ICD-10-CM

## 2019-08-29 DIAGNOSIS — Z79899 Other long term (current) drug therapy: Secondary | ICD-10-CM | POA: Insufficient documentation

## 2019-08-29 DIAGNOSIS — N838 Other noninflammatory disorders of ovary, fallopian tube and broad ligament: Secondary | ICD-10-CM | POA: Insufficient documentation

## 2019-08-29 DIAGNOSIS — Z801 Family history of malignant neoplasm of trachea, bronchus and lung: Secondary | ICD-10-CM | POA: Insufficient documentation

## 2019-08-29 HISTORY — PX: ROBOTIC ASSISTED SALPINGO OOPHERECTOMY: SHX6082

## 2019-08-29 HISTORY — DX: Type 2 diabetes mellitus without complications: E11.9

## 2019-08-29 LAB — GLUCOSE, CAPILLARY
Glucose-Capillary: 110 mg/dL — ABNORMAL HIGH (ref 70–99)
Glucose-Capillary: 184 mg/dL — ABNORMAL HIGH (ref 70–99)

## 2019-08-29 LAB — TYPE AND SCREEN
ABO/RH(D): O POS
Antibody Screen: NEGATIVE

## 2019-08-29 SURGERY — SALPINGO-OOPHORECTOMY, ROBOT-ASSISTED
Anesthesia: General | Site: Abdomen | Laterality: Bilateral

## 2019-08-29 MED ORDER — GABAPENTIN 100 MG PO CAPS
200.0000 mg | ORAL_CAPSULE | ORAL | Status: AC
  Administered 2019-08-29: 200 mg via ORAL

## 2019-08-29 MED ORDER — BUPIVACAINE HCL 0.25 % IJ SOLN
INTRAMUSCULAR | Status: DC | PRN
  Administered 2019-08-29: 25 mL

## 2019-08-29 MED ORDER — ACETAMINOPHEN 500 MG PO TABS: 1000.0000 mg | ORAL_TABLET | Freq: Once | ORAL | Status: DC

## 2019-08-29 MED ORDER — PROPOFOL 500 MG/50ML IV EMUL
INTRAVENOUS | Status: DC | PRN
Start: 2019-08-29 — End: 2019-08-29
  Administered 2019-08-29: 150 ug/kg/min via INTRAVENOUS

## 2019-08-29 MED ORDER — LIDOCAINE HCL (CARDIAC) PF 100 MG/5ML IV SOSY
PREFILLED_SYRINGE | INTRAVENOUS | Status: DC | PRN
  Administered 2019-08-29: 100 mg via INTRAVENOUS

## 2019-08-29 MED ORDER — MIDAZOLAM HCL 5 MG/5ML IJ SOLN
INTRAMUSCULAR | Status: DC | PRN
  Administered 2019-08-29: 2 mg via INTRAVENOUS

## 2019-08-29 MED ORDER — SCOPOLAMINE 1 MG/3DAYS TD PT72: 1.0000 | MEDICATED_PATCH | TRANSDERMAL | Status: DC

## 2019-08-29 MED ORDER — HEPARIN SODIUM (PORCINE) 5000 UNIT/ML IJ SOLN
5000.0000 [IU] | INTRAMUSCULAR | Status: AC
  Administered 2019-08-29: 5000 [IU] via SUBCUTANEOUS

## 2019-08-29 MED ORDER — ROCURONIUM BROMIDE 100 MG/10ML IV SOLN
INTRAVENOUS | Status: DC | PRN
  Administered 2019-08-29: 60 mg via INTRAVENOUS
  Administered 2019-08-29: 20 mg via INTRAVENOUS

## 2019-08-29 MED ORDER — SCOPOLAMINE 1 MG/3DAYS TD PT72
MEDICATED_PATCH | TRANSDERMAL | Status: AC
  Filled 2019-08-29: qty 1

## 2019-08-29 MED ORDER — FENTANYL CITRATE (PF) 100 MCG/2ML IJ SOLN: 25.0000 ug | INTRAMUSCULAR | Status: DC | PRN

## 2019-08-29 MED ORDER — ONDANSETRON HCL 4 MG/2ML IJ SOLN
INTRAMUSCULAR | Status: DC | PRN
  Administered 2019-08-29: 8 mg via INTRAVENOUS

## 2019-08-29 MED ORDER — ACETAMINOPHEN 500 MG PO TABS
ORAL_TABLET | ORAL | Status: AC
  Filled 2019-08-29: qty 2

## 2019-08-29 MED ORDER — ACETAMINOPHEN 500 MG PO TABS
1000.0000 mg | ORAL_TABLET | ORAL | Status: AC
  Administered 2019-08-29: 1000 mg via ORAL

## 2019-08-29 MED ORDER — LACTATED RINGERS IV SOLN: INTRAVENOUS | Status: DC

## 2019-08-29 MED ORDER — GABAPENTIN 100 MG PO CAPS
ORAL_CAPSULE | ORAL | Status: AC
  Filled 2019-08-29: qty 2

## 2019-08-29 MED ORDER — DEXAMETHASONE SODIUM PHOSPHATE 10 MG/ML IJ SOLN: 4.0000 mg | INTRAMUSCULAR | Status: DC

## 2019-08-29 MED ORDER — PROMETHAZINE HCL 25 MG/ML IJ SOLN: 6.2500 mg | INTRAMUSCULAR | Status: DC | PRN

## 2019-08-29 MED ORDER — SUGAMMADEX SODIUM 200 MG/2ML IV SOLN
INTRAVENOUS | Status: DC | PRN
Start: 2019-08-29 — End: 2019-08-29
  Administered 2019-08-29: 200 mg via INTRAVENOUS
  Administered 2019-08-29: 25 mg via INTRAVENOUS

## 2019-08-29 MED ORDER — PROPOFOL 10 MG/ML IV BOLUS
INTRAVENOUS | Status: DC | PRN
  Administered 2019-08-29: 100 mg via INTRAVENOUS
  Administered 2019-08-29: 150 mg via INTRAVENOUS
  Administered 2019-08-29: 50 mg via INTRAVENOUS
  Administered 2019-08-29 (×2): 100 mg via INTRAVENOUS

## 2019-08-29 MED ORDER — SCOPOLAMINE 1 MG/3DAYS TD PT72
1.0000 | MEDICATED_PATCH | Freq: Once | TRANSDERMAL | Status: DC
  Administered 2019-08-29: 1.5 mg via TRANSDERMAL

## 2019-08-29 MED ORDER — FENTANYL CITRATE (PF) 100 MCG/2ML IJ SOLN
INTRAMUSCULAR | Status: DC | PRN
  Administered 2019-08-29: 100 ug via INTRAVENOUS
  Administered 2019-08-29 (×3): 50 ug via INTRAVENOUS
  Administered 2019-08-29: 100 ug via INTRAVENOUS

## 2019-08-29 MED ORDER — DEXAMETHASONE SODIUM PHOSPHATE 4 MG/ML IJ SOLN
INTRAMUSCULAR | Status: DC | PRN
  Administered 2019-08-29: 5 mg via INTRAVENOUS

## 2019-08-29 MED ORDER — HEPARIN SODIUM (PORCINE) 5000 UNIT/ML IJ SOLN
INTRAMUSCULAR | Status: AC
  Filled 2019-08-29: qty 1

## 2019-08-29 MED ORDER — PROPOFOL 500 MG/50ML IV EMUL
INTRAVENOUS | Status: DC | PRN
Start: 2019-08-29 — End: 2019-08-29

## 2019-08-29 MED ORDER — ALBUTEROL SULFATE HFA 108 (90 BASE) MCG/ACT IN AERS
INHALATION_SPRAY | RESPIRATORY_TRACT | Status: DC | PRN
  Administered 2019-08-29: 12 via RESPIRATORY_TRACT
  Administered 2019-08-29: 2 via RESPIRATORY_TRACT

## 2019-08-29 MED ORDER — LABETALOL HCL 5 MG/ML IV SOLN
INTRAVENOUS | Status: DC | PRN
  Administered 2019-08-29 (×5): 5 mg via INTRAVENOUS

## 2019-08-29 SURGICAL SUPPLY — 69 items
ADH SKN CLS APL DERMABOND .7 (GAUZE/BANDAGES/DRESSINGS) ×1
AGENT HMST KT MTR STRL THRMB (HEMOSTASIS)
APL ESCP 34 STRL LF DISP (HEMOSTASIS)
APPLICATOR SURGIFLO ENDO (HEMOSTASIS) IMPLANT
BAG LAPAROSCOPIC 12 15 PORT 16 (BASKET) IMPLANT
BAG RETRIEVAL 12/15 (BASKET)
BAG SPEC RTRVL LRG 6X4 10 (ENDOMECHANICALS)
BLADE SURG 10 STRL SS (BLADE) IMPLANT
COVER BACK TABLE 60X90IN (DRAPES) ×2 IMPLANT
COVER TIP SHEARS 8 DVNC (MISCELLANEOUS) ×1 IMPLANT
COVER TIP SHEARS 8MM DA VINCI (MISCELLANEOUS) ×2
COVER WAND RF STERILE (DRAPES) ×2 IMPLANT
DECANTER SPIKE VIAL GLASS SM (MISCELLANEOUS) IMPLANT
DERMABOND ADVANCED (GAUZE/BANDAGES/DRESSINGS) ×1
DERMABOND ADVANCED .7 DNX12 (GAUZE/BANDAGES/DRESSINGS) ×1 IMPLANT
DRAPE ARM DVNC X/XI (DISPOSABLE) ×4 IMPLANT
DRAPE COLUMN DVNC XI (DISPOSABLE) ×1 IMPLANT
DRAPE DA VINCI XI ARM (DISPOSABLE) ×8
DRAPE DA VINCI XI COLUMN (DISPOSABLE) ×2
DRAPE SHEET LG 3/4 BI-LAMINATE (DRAPES) ×2 IMPLANT
DRAPE SURG IRRIG POUCH 19X23 (DRAPES) ×2 IMPLANT
ELECT REM PT RETURN 9FT ADLT (ELECTROSURGICAL) ×2
ELECTRODE REM PT RTRN 9FT ADLT (ELECTROSURGICAL) ×1 IMPLANT
GAUZE 4X4 16PLY RFD (DISPOSABLE) ×2 IMPLANT
GLOVE BIO SURGEON STRL SZ 6 (GLOVE) ×8 IMPLANT
GLOVE BIO SURGEON STRL SZ 6.5 (GLOVE) ×4 IMPLANT
HOLDER FOLEY CATH W/STRAP (MISCELLANEOUS) ×2 IMPLANT
IRRIG SUCT STRYKERFLOW 2 WTIP (MISCELLANEOUS) ×2
IRRIGATION SUCT STRKRFLW 2 WTP (MISCELLANEOUS) ×1 IMPLANT
KIT PROCEDURE DA VINCI SI (MISCELLANEOUS)
KIT PROCEDURE DVNC SI (MISCELLANEOUS) IMPLANT
KIT TURNOVER CYSTO (KITS) IMPLANT
LEGGING LITHOTOMY PAIR STRL (DRAPES) ×2 IMPLANT
MANIPULATOR UTERINE 4.5 ZUMI (MISCELLANEOUS) ×2 IMPLANT
NDL SAFETY ECLIPSE 18X1.5 (NEEDLE) IMPLANT
NDL SPNL 18GX3.5 QUINCKE PK (NEEDLE) IMPLANT
NEEDLE HYPO 18GX1.5 SHARP (NEEDLE)
NEEDLE HYPO 22GX1.5 SAFETY (NEEDLE) ×2 IMPLANT
NEEDLE SPNL 18GX3.5 QUINCKE PK (NEEDLE) IMPLANT
OBTURATOR OPTICAL STANDARD 8MM (TROCAR) ×2
OBTURATOR OPTICAL STND 8 DVNC (TROCAR) ×1
OBTURATOR OPTICALSTD 8 DVNC (TROCAR) ×1 IMPLANT
PACK ROBOT GYN WLCUSTOM (TRAY / TRAY PROCEDURE) ×2 IMPLANT
PACK ROBOTIC GOWN (GOWN DISPOSABLE) ×2 IMPLANT
PAD POSITIONING PINK XL (MISCELLANEOUS) ×2 IMPLANT
PENCIL BUTTON HOLSTER BLD 10FT (ELECTRODE) IMPLANT
PORT ACCESS TROCAR AIRSEAL 12 (TROCAR) ×1 IMPLANT
PORT ACCESS TROCAR AIRSEAL 5M (TROCAR) ×1
POUCH SPECIMEN RETRIEVAL 10MM (ENDOMECHANICALS) IMPLANT
SEAL CANN UNIV 5-8 DVNC XI (MISCELLANEOUS) ×3 IMPLANT
SEAL XI 5MM-8MM UNIVERSAL (MISCELLANEOUS) ×6
SET TRI-LUMEN FLTR TB AIRSEAL (TUBING) ×2 IMPLANT
SURGIFLO W/THROMBIN 8M KIT (HEMOSTASIS) IMPLANT
SUT MNCRL AB 4-0 PS2 18 (SUTURE) ×2 IMPLANT
SUT VIC AB 0 CT1 36 (SUTURE) IMPLANT
SUT VIC AB 3-0 SH 27 (SUTURE)
SUT VIC AB 3-0 SH 27X BRD (SUTURE) IMPLANT
SUT VIC AB 4-0 PS2 18 (SUTURE) ×4 IMPLANT
SYR 10ML LL (SYRINGE) IMPLANT
SYR CONTROL 10ML LL (SYRINGE) ×4 IMPLANT
SYS BAG RETRIEVAL 10MM (BASKET) ×2
SYSTEM BAG RETRIEVAL 10MM (BASKET) IMPLANT
TRAP SPECIMEN MUCUS 40CC (MISCELLANEOUS) IMPLANT
TRAY FOLEY W/BAG SLVR 14FR (SET/KITS/TRAYS/PACK) ×2 IMPLANT
TROCAR BLADELESS OPT 12M 100M (ENDOMECHANICALS) IMPLANT
TUBE CONNECTING 12X1/4 (SUCTIONS) ×2 IMPLANT
UNDERPAD 30X30 (UNDERPADS AND DIAPERS) ×2 IMPLANT
WATER STERILE IRR 1000ML POUR (IV SOLUTION) ×2 IMPLANT
YANKAUER SUCT BULB TIP NO VENT (SUCTIONS) IMPLANT

## 2019-08-29 NOTE — Transfer of Care (Signed)
Immediate Anesthesia Transfer of Care Note  Patient: Erin Fields  Procedure(s) Performed: Procedure(s) (LRB): XI ROBOTIC ASSISTED SALPINGO OOPHORECTOMY (Bilateral)  Patient Location: PACU  Anesthesia Type: General  Level of Consciousness: awake, sedated, patient cooperative and responds to stimulation  Airway & Oxygen Therapy: Patient Spontanous Breathing and Patient connected to face mask oxygen  Post-op Assessment: Report given to PACU RN, Post -op Vital signs reviewed and stable and Patient moving all extremities  Post vital signs: Reviewed and stable  Complications: No apparent anesthesia complications

## 2019-08-29 NOTE — Interval H&P Note (Signed)
History and Physical Interval Note:  08/29/2019 8:02 AM  Erin Fields  has presented today for surgery, with the diagnosis of ESTROGEN RECEPTOR POSITIVE BREAST CANCER.  The various methods of treatment have been discussed with the patient and family. After consideration of risks, benefits and other options for treatment, the patient has consented to  Procedure(s): XI ROBOTIC ASSISTED SALPINGO OOPHORECTOMY (Bilateral) as a surgical intervention.  The patient's history has been reviewed, patient examined, no change in status, stable for surgery.  I have reviewed the patient's chart and labs.  Questions were answered to the patient's satisfaction.     [name]

## 2019-08-29 NOTE — Anesthesia Postprocedure Evaluation (Signed)
Anesthesia Post Note  Patient: Erin Fields  Procedure(s) Performed: XI ROBOTIC ASSISTED SALPINGO OOPHORECTOMY (Bilateral Abdomen)     Patient location during evaluation: PACU Anesthesia Type: General Level of consciousness: awake and alert and oriented Pain management: pain level controlled Vital Signs Assessment: post-procedure vital signs reviewed and stable Respiratory status: spontaneous breathing, nonlabored ventilation and respiratory function stable Cardiovascular status: blood pressure returned to baseline Postop Assessment: no apparent nausea or vomiting Anesthetic complications: no    Last Vitals:  Vitals:   08/29/19 1045 08/29/19 1100  BP: (!) 149/80 (!) 142/78  Pulse: 81 79  Resp: 15 20  Temp:    SpO2: 97% 95%    Last Pain:  Vitals:   08/29/19 1115  PainSc: 0-No pain                 Brennan Bailey

## 2019-08-29 NOTE — Anesthesia Procedure Notes (Signed)
Procedure Name: Intubation Date/Time: 08/29/2019 8:38 AM Performed by: Justice Rocher, CRNA Pre-anesthesia Checklist: Patient identified, Emergency Drugs available, Suction available, Patient being monitored and Timeout performed Patient Re-evaluated:Patient Re-evaluated prior to induction Oxygen Delivery Method: Circle system utilized Preoxygenation: Pre-oxygenation with 100% oxygen Induction Type: IV induction Ventilation: Mask ventilation without difficulty Laryngoscope Size: Mac and 4 Grade View: Grade II Tube type: Oral Tube size: 7.0 mm Number of attempts: 1 Airway Equipment and Method: Stylet and Oral airway Placement Confirmation: ETT inserted through vocal cords under direct vision,  positive ETCO2,  breath sounds checked- equal and bilateral and CO2 detector Secured at: 22 cm Tube secured with: Tape Dental Injury: Teeth and Oropharynx as per pre-operative assessment

## 2019-08-29 NOTE — Op Note (Signed)
OPERATIVE NOTE  Pre-operative Diagnosis: ER+ breast cancer  Post-operative Diagnosis: same  Operation: Robotic-assisted laparoscopic bilateral salpingoophorectomy  Surgeon: Jeral Pinch MD  Assistant Surgeon: Joylene John NP  Anesthesia: GET  Urine Output: 50cc  Operative Findings: On EUA, small mobile uterus, no adnexal masses. On intra-abdominal exam, normal upper abdominal survey. Normal omentum, small and large bowel. Normal appearing adnexa, 6cm uterus.   Estimated Blood Loss:  less than 50 mL      Total IV Fluids: 1,000 ml         Specimens: bilateral tubes and ovaries         Complications:  None apparent; patient tolerated the procedure well.         Disposition: PACU - hemodynamically stable.  Procedure Details  The patient was seen in the Holding Room. The risks, benefits, complications, treatment options, and expected outcomes were discussed with the patient.  The patient concurred with the proposed plan, giving informed consent.  The patient was identified as Erin Fields and the procedure verified as a Robotic-assisted bilateral salpingo oophorectomy.   After induction of anesthesia, the patient was draped and prepped in the usual sterile manner. Patient was placed in supine position after anesthesia and draped and prepped in the usual sterile manner as follows: Her arms were tucked to her side with all appropriate precautions.  The shoulders were stabilized with padded shoulder blocks applied to the acromium processes.  The patient was placed in the semi-lithotomy position in [place].  The perineum and vagina were prepped with Betadine. The patient was draped after the CholoraPrep had been allowed to dry for 3 minutes.  A Time Out was held and the above information confirmed.  The urethra was prepped with Betadine. Foley catheter was placed.  A spongestick was placed in the vagina for manipulation.  OG tube placement was confirmed and to suction.   Next, a  10 mm skin incision was made 1 cm below the subcostal margin in the midclavicular line.  The 5 mm Optiview port and scope was used for direct entry.  Opening pressure was under 10 mm CO2.  The abdomen was insufflated and the findings were noted as above.   At this point and all points during the procedure, the patient's intra-abdominal pressure did not exceed 15 mmHg. Next, an 8 mm skin incision was made superior to the umbilicus and a right and left port were placed about 8 cm lateral to the robot port on the right and left side.  The 5 mm assist trocar was exchanged for a 10-12 mm port. All ports were placed under direct visualization.  The patient was placed in steep Trendelenburg.  Bowel was folded away into the upper abdomen.  The robot was docked in the normal manner.  The right and left peritoneum were opened parallel to the IP ligament to open the retroperitoneal spaces bilaterally. The ureter was noted to be on the medial leaf of the broad ligament.  The peritoneum above the ureter was incised and stretched and the infundibulopelvic ligament was skeletonized, cauterized and cut. The broad ligament was transected medially to the utero-ovarian ligament and fallopian tube just lateral to the uterine cornua. These were skeletonized, cauterized and transected, freeing the adnexa.   Irrigation was used and excellent hemostasis was achieved.  An Endocatch bag was inserted through the assist trocar and the adnexa placed inside. At this point in the procedure was completed.  Robotic instruments were removed under direct visulaization.  The robot was  undocked. The adnexa were removed from inside the bag and handed off the field. The bag was removed. The fascia at the 10-12 mm port was closed with 0 Vicryl on a UR-5 needle.  The subcuticular tissue was closed with 4-0 Vicryl and the skin was closed with 4-0 Monocryl in a subcuticular manner.  Dermabond was applied.    The vagina was swabbed with no bleeding  noted.   All sponge, lap and needle counts were correct x  3. Foley catheter was removed.   The patient was transferred to the recovery room in stable condition.  Jeral Pinch, MD

## 2019-08-29 NOTE — Discharge Instructions (Addendum)
08/29/2019  Return to work: 2-4 weeks if applicable  Activity: 1. Be up and out of the bed during the day.  Take a nap if needed.  You may walk up steps but be careful and use the hand rail.  Stair climbing will tire you more than you think, you may need to stop part way and rest.   2. No lifting or straining for 4 weeks.  3. No driving for around 1 week(s).  Do not drive if you are taking narcotic pain medicine. Your reaction time needs to be back to normal.  4. Shower daily.  Use soap and water on your incision and pat dry; don't rub.  No tub baths until cleared by your surgeon.   5. No sexual activity and nothing in the vagina for 2 weeks.  6. You may experience a small amount of clear drainage from your incisions, which is normal.  If the drainage persists or increases, please call the office.  7. Take Tylenol or ibuprofen first for pain and only use narcotic pain medication for severe pain not relieved by the Tylenol or Ibuprofen.  Monitor your Tylenol intake to a max of 4,000 mg.  Diet: 1. Low sodium Heart Healthy Diet is recommended.  Wound Care: 1. Keep clean and dry.  Shower daily.  Reasons to call the Doctor:  Fever - Oral temperature greater than 100.4 degrees Fahrenheit  Foul-smelling vaginal discharge  Difficulty urinating  Nausea and vomiting  Increased pain at the site of the incision that is unrelieved with pain medicine.  Difficulty breathing with or without chest pain  New calf pain especially if only on one side  Sudden, continuing increased vaginal bleeding with or without clots.   Contacts: For questions or concerns you should contact:  Dr. Jeral Pinch at [phone]  Joylene John, NP at [phone]  After Hours: call [phone] and have the GYN Oncologist paged/contacted   Post Anesthesia Home Care Instructions  Activity: Get plenty of rest for the remainder of the day. A responsible individual must stay with you for 24 hours  following the procedure.  For the next 24 hours, DO NOT: -Drive a car -Paediatric nurse -Drink alcoholic beverages -Take any medication unless instructed by your physician -Make any legal decisions or sign important papers.  Meals: Start with liquid foods such as gelatin or soup. Progress to regular foods as tolerated. Avoid greasy, spicy, heavy foods. If nausea and/or vomiting occur, drink only clear liquids until the nausea and/or vomiting subsides. Call your physician if vomiting continues.  Special Instructions/Symptoms: Your throat may feel dry or sore from the anesthesia or the breathing tube placed in your throat during surgery. If this causes discomfort, gargle with warm salt water. The discomfort should disappear within 24 hours.  If you had a scopolamine patch placed behind your ear for the management of post- operative nausea and/or vomiting:  1. The medication in the patch is effective for 72 hours, after which it should be removed.  Wrap patch in a tissue and discard in the trash. Wash hands thoroughly with soap and water. 2. You may remove the patch earlier than 72 hours if you experience unpleasant side effects which may include dry mouth, dizziness or visual disturbances. 3. Avoid touching the patch. Wash your hands with soap and water after contact with the patch.

## 2019-09-01 ENCOUNTER — Telehealth: Payer: Self-pay

## 2019-09-01 LAB — SURGICAL PATHOLOGY

## 2019-09-01 NOTE — Telephone Encounter (Signed)
Erin Fields states that she is eating.drinking, and urinating well. She has moved her bowels well. Afebrile. Incisions are D&I. Pain controled with current medications. Pt aware of post op appointment and the office number [phone] to call if she has any questions or concerns prior to her post op appointment 09-19-19.

## 2019-09-05 ENCOUNTER — Other Ambulatory Visit: Payer: 59

## 2019-09-12 ENCOUNTER — Encounter: Payer: Self-pay | Admitting: Family Medicine

## 2019-09-16 NOTE — Progress Notes (Signed)
Gynecologic Oncology Return Clinic Visit  09/19/19  Reason for Visit: post-op follow-up  Treatment History: Oncology History Overview Note  Cancer Staging Malignant neoplasm of upper-outer quadrant of right breast in female, estrogen receptor positive (Erin Fields) Staging form: Breast, AJCC 8th Edition - Clinical stage from 03/16/2017: Stage IA (cT1b, cN0, cM0, G2, ER: Positive, PR: Positive, HER2: Negative) - Signed by Erin Merle, MD on 03/28/2017 - Pathologic stage from 05/15/2017: Stage IA (pT2, pN0, cM0, G2, ER+, PR+, HER2-, Oncotype DX score: 3) - Signed by Erin Merle, MD on 08/06/2017     Malignant neoplasm of upper-outer quadrant of right breast in female, estrogen receptor positive (Erin Fields)  03/15/2017 Mammogram   Diagnostic Mammogram Right Breast  IMPRESSION: Palpable abnormality corresponds to distortion and focal mass associated acoustic shadowing measuring 0.8x0.9x0.9cm in the 930 o'clock location of the right breast 7 cm from the nipple.   RECOMMENDATION: Ultrasound-guided core biopsy is recommended and scheduled for the patient.   03/16/2017 Initial Biopsy   Biopsy Results  Diagnosis 03/16/17 Breast, right, needle core biopsy, 9:30 o'clock, ribbon clip placed - INVASIVE MAMMARY CARCINOMA. - SEE COMMENT.    03/16/2017 Receptors her2   Estrogen Receptor: 95%, POSITIVE, STRONG STAINING INTENSITY Progesterone Receptor: 95%, POSITIVE, STRONG STAINING INTENSITY Proliferation Marker Ki67: 3% HER2: NEGATIVE   03/20/2017 Initial Diagnosis   Malignant neoplasm of upper-outer quadrant of right breast in female, estrogen receptor positive (Erin Fields)   04/24/2017 Surgery   Right Breast Lumpectomy with Sentinel Node Biopsy with Dr. Barry Fields    04/24/2017 Pathology Results   Diagnosis 04/24/17 1. Breast, lumpectomy, Right w/seed - INVASIVE LOBULAR CARCINOMA, GRADE 2, SPANNING 2.2 CM. - LOBULAR NEOPLASIA (ATYPICAL LOBULAR HYPERPLASIA). - ANTERIOR SUPERIOR MARGIN IS BROADLY POSITIVE. - BIOPSY  SITE. - SEE ONCOLOGY TABLE. 2. Breast, excision, Right additional Medial Margin - INVASIVE LOBULAR CARCINOMA. - LOBULAR CARCINOMA IN SITU. - INVASIVE CARCINOMA IS 0.3 CM FROM NEW MEDIAL MARGIN. 3. Lymph node, sentinel, biopsy, Right Axillary #1 - ISOLATED TUMOR CELLS IN ONE OF ONE LYMPH NODES (0/1). 4. Lymph node, sentinel, biopsy, Right Axillary #2 - ONE OF ONE LYMPH NODES NEGATIVE FOR CARCINOMA (0/1). 5. Lymph node, sentinel, biopsy, Right Axillary #3 - ONE OF ONE LYMPH NODES NEGATIVE FOR CARCINOMA (0/1).   04/24/2017 Oncotype testing   Her Oncotyple recurrence score was 3, her distant recurrence score with Tamoxifen was 3% and her adjuvant chemotherapy benefit was <1%.    05/15/2017 Surgery   Re-excision of Right Breast Lumpectomy with Dr. Barry Fields    05/15/2017 Pathology Results   Diagnosis 05/15/17 Breast, excision, Right additional anterior margin - INVASIVE LOBULAR CARCINOMA. - ATYPICAL LOBULAR HYPERPLASIA. - THE NEW ANTERIOR SURGICAL RESECTION MARGIN IS NEGATIVE FOR CARCINOMA.   05/15/2017 Cancer Staging   Staging form: Breast, AJCC 8th Edition - Pathologic stage from 05/15/2017: Stage IA (pT2, pN0, cM0, G2, ER+, PR+, HER2-, Oncotype DX score: 3) - Signed by Erin Merle, MD on 08/06/2017   06/26/2017 -  Radiation Therapy   Adjuvant RT with Dr. [name]   07/12/2017 Genetic Testing   Negative genetic testing on the multi-cancer panel.  The Multi-Gene Panel offered by [name] includes sequencing and/or deletion duplication testing of the following 83 genes: ALK, APC, ATM, AXIN2,BAP1,  BARD1, BLM, BMPR1A, BRCA1, BRCA2, BRIP1, CASR, CDC73, CDH1, CDK4, CDKN1B, CDKN1C, CDKN2A (p14ARF), CDKN2A (p16INK4a), CEBPA, CHEK2, CTNNA1, DICER1, DIS3L2, EGFR (c.2369C>T, p.Thr790Met variant only), EPCAM (Deletion/duplication testing only), FH, FLCN, GATA2, GPC3, GREM1 (Promoter region deletion/duplication testing only), HOXB13 (c.251G>A, p.Gly84Glu), HRAS, KIT, MAX, MEN1, MET, MITF (  c.952G>A, p.Glu318Lys  variant only), MLH1, MSH2, MSH3, MSH6, MUTYH, NBN, NF1, NF2, NTHL1, PALB2, PDGFRA, PHOX2B, PMS2, POLD1, POLE, POT1, PRKAR1A, PTCH1, PTEN, RAD50, RAD51C, RAD51D, RB1, RECQL4, RET, RUNX1, SDHAF2, SDHA (sequence changes only), SDHB, SDHC, SDHD, SMAD4, SMARCA4, SMARCB1, SMARCE1, STK11, SUFU, TERT, TERT, TMEM127, TP53, TSC1, TSC2, VHL, WRN and WT1.  The report date is July 12, 2017.    08/13/2017 Imaging   08/13/2017 DEXA ASSESSMENT: The BMD measured at AP Spine L1-L4 is 1.120 g/cm2 with a T-score of -0.5. This patient is considered normal according to [facility]) criteria.    08/2017 -  Anti-estrogen oral therapy   Letrozole daily, changed to Exemestane due to poor tolerance of Letrozole   01/16/2018 Imaging   01/16/2018 CT Chest IMPRESSION: Stable 2 mm RIGHT upper lobe nodule.  Post radiation therapy changes at the anterolateral RIGHT upper lobe.  Post RIGHT breast surgery and RIGHT axillary node dissection.  No new abnormalities.  Aortic Atherosclerosis (ICD10-I70.0).   08/09/2018 Mammogram   IMPRESSION: 1. No mammographic evidence of malignancy involving either breast. 2. Expected post lumpectomy and post radiation changes involving the RIGHT breast.     Interval History: Patient reports overall doing well since surgery.  She denies any significant abdominal or pelvic pain.  She had an increase in her hot flashes after surgery but this is slowly improving.  She denies any fevers or chills.  She endorses a good appetite without any nausea or emesis.  She got her first Humira injection on 6/1.  She denies any vaginal bleeding or discharge.  S/p Robotic BSO on 3/55, uncomplicated.   Past Medical/Surgical History: Past Medical History:  Diagnosis Date  . Anxiety   . Asthma   . Cancer [facility]) 2019   right breast cancer  . Colon polyps   . Depression   . Diabetes mellitus without complication ([place])    type 2  . Environmental allergies   . GERD  (gastroesophageal reflux disease)   . Hyperlipidemia   . Hypertension   . Hypothyroidism   . Migraines   . Personal history of radiation therapy 2019   36 radiation tx  . PONV (postoperative nausea and vomiting)    scopolamine patch helps  . Ulcerative colitis ([place])   . Varicose veins     Past Surgical History:  Procedure Laterality Date  . BREAST BIOPSY Right 02/2017  . BREAST LUMPECTOMY WITH RADIOACTIVE SEED AND SENTINEL LYMPH NODE BIOPSY Right 04/24/2017   Procedure: RIGHT BREAST LUMPECTOMY WITH RADIOACTIVE SEED AND SENTINEL LYMPH NODE BIOPSY;  Surgeon: [name], MD;  Location: [place];  Service: General;  Laterality: Right;  . crapal tunnel relase Left 05/2018  . ENDOMETRIAL ABLATION  2010   Had abnormal uterine bleeding (heavy and frequent), performed in [place], menses stopped completely 2 years after  . EXPLORATORY LAPAROTOMY     Had exploratory surgery at the age of 16 for abdominal pain with cyst found on her left ovary, not treated surgically  . FOOT SURGERY Right 2012  . HAND SURGERY Right   . RE-EXCISION OF BREAST LUMPECTOMY Right 05/15/2017   Procedure: RE-EXCISION OF BREAST LUMPECTOMY;  Surgeon: [name], MD;  Location: [place];  Service: General;  Laterality: Right;  . ROBOTIC ASSISTED SALPINGO OOPHERECTOMY Bilateral 08/29/2019   Procedure: XI ROBOTIC ASSISTED SALPINGO OOPHORECTOMY;  Surgeon: [name], MD;  Location: [facility];  Service: Gynecology;  Laterality: Bilateral;  . Septoplasty with turbinate reduction    .  TONSILLECTOMY  1980  . VEIN SURGERY     Removal Right Leg  . WISDOM TOOTH EXTRACTION      Family History  Problem Relation Age of Onset  . Arthritis Mother 76       Deceased  . Breast cancer Mother   . Lung cancer Mother   . Hyperlipidemia Mother   . Heart disease Mother   . Hypertension Mother   . Diabetes Mother   . Crohn's disease Mother   . Diabetes Maternal  Grandfather   . Heart disease Maternal Grandfather   . Heart attack Maternal Grandfather   . Leukemia Maternal Grandfather   . Colitis Sister   . Leukemia Cousin 3       mat cousin  . Lung cancer Father   . Brain cancer Father     Social History   Socioeconomic History  . Marital status: Widowed    Spouse name: Not on file  . Number of children: Not on file  . Years of education: Not on file  . Highest education level: Not on file  Occupational History  . Not on file  Tobacco Use  . Smoking status: Former Smoker    Packs/day: 3.00    Years: 15.00    Pack years: 45.00    Types: Cigarettes    Quit date: 09/14/1993    Years since quitting: 26.0  . Smokeless tobacco: Never Used  . Tobacco comment: Quit >20 yrs ago  Substance and Sexual Activity  . Alcohol use: Yes    Comment: Drinks a couple every 2-3 months  . Drug use: No  . Sexual activity: Not Currently    Comment: ablation  Other Topics Concern  . Not on file  Social History Narrative  . Not on file   Social Determinants of Health   Financial Resource Strain:   . Difficulty of Paying Living Expenses:   Food Insecurity:   . Worried About Charity fundraiser in the Last Year:   . Arboriculturist in the Last Year:   Transportation Needs:   . Film/video editor (Medical):   Marland Kitchen Lack of Transportation (Non-Medical):   Physical Activity:   . Days of Exercise per Week:   . Minutes of Exercise per Session:   Stress:   . Feeling of Stress :   Social Connections:   . Frequency of Communication with Friends and Family:   . Frequency of Social Gatherings with Friends and Family:   . Attends Religious Services:   . Active Member of Clubs or Organizations:   . Attends Archivist Meetings:   Marland Kitchen Marital Status:     Current Medications:  Current Outpatient Medications:  .  acyclovir (ZOVIRAX) 400 MG tablet, TAKE 1 TABLET (400 MG TOTAL) BY MOUTH 3 (THREE) TIMES DAILY AS NEEDED., Disp: 270 tablet, Rfl:  1 .  Adalimumab (HUMIRA PEN) 40 MG/0.4ML PNKT, Inject into the skin., Disp: , Rfl:  .  azelastine (ASTELIN) 0.1 % nasal spray, 2 sprays 2 (two) times daily., Disp: , Rfl: 6 .  budesonide (ENTOCORT EC) 3 MG 24 hr capsule, Take 9 mg by mouth every evening. , Disp: , Rfl:  .  budesonide-formoterol (SYMBICORT) 160-4.5 MCG/ACT inhaler, Inhale 2 puffs into the lungs 2 (two) times daily., Disp: , Rfl:  .  EPINEPHrine 0.3 mg/0.3 mL IJ SOAJ injection, epinephrine 0.3 mg/0.3 mL injection, auto-injector  TK UTD PRN, Disp: , Rfl:  .  exemestane (AROMASIN) 25 MG tablet, Take 1  tablet (25 mg total) by mouth daily after breakfast., Disp: 90 tablet, Rfl: 1 .  HYDROcodone-acetaminophen (NORCO/VICODIN) 5-325 MG tablet, Take 1 tablet by mouth every 6 (six) hours as needed for severe pain. For AFTER surgery, do not take and drive, Disp: 10 tablet, Rfl: 0 .  Hyoscyamine Sulfate SL 0.125 MG SUBL, hyoscyamine 0.125 mg sublingual tablet  TAKE 1 TABLET (0.125 MG TOTAL) BY MOUTH EVERY 6 HOURS AS NEEDED FOR UP TO 30 DAYS FOR CRAMPING., Disp: , Rfl:  .  lansoprazole (PREVACID) 30 MG capsule, Take 30 mg by mouth daily at 12 noon., Disp: , Rfl:  .  levalbuterol (XOPENEX HFA) 45 MCG/ACT inhaler, Inhale 2 puffs into the lungs every 4 (four) hours as needed for wheezing (Q4-6 Hours PRN)., Disp: , Rfl:  .  levothyroxine (SYNTHROID) 88 MCG tablet, TAKE 1 TABLET BY MOUTH EVERY DAY, Disp: 90 tablet, Rfl: 1 .  mesalamine (LIALDA) 1.2 G EC tablet, Take 2.4 g by mouth in the morning and at bedtime. , Disp: , Rfl:  .  metFORMIN (GLUCOPHAGE) 500 MG tablet, Take 1 tablet (500 mg total) by mouth 2 (two) times daily with a meal., Disp: 180 tablet, Rfl: 3 .  montelukast (SINGULAIR) 10 MG tablet, Take 10 mg by mouth at bedtime. , Disp: , Rfl:  .  olmesartan (BENICAR) 40 MG tablet, Take 1 tablet (40 mg total) by mouth daily., Disp: 90 tablet, Rfl: 1 .  promethazine (PHENERGAN) 25 MG tablet, TAKE 1/2-1 TABLET DAILY AS NEEDED FOR NAUSEA, Disp: 30  tablet, Rfl: 5 .  rosuvastatin (CRESTOR) 20 MG tablet, Take 1 tablet (20 mg total) by mouth daily. (Patient taking differently: Take 20 mg by mouth at bedtime. ), Disp: 90 tablet, Rfl: 3 .  triamcinolone (NASACORT ALLERGY 24HR) 55 MCG/ACT AERO nasal inhaler, Place 1 spray into the nose daily., Disp: , Rfl:  .  UNABLE TO FIND, mecalamine support bid, Disp: , Rfl:  .  venlafaxine XR (EFFEXOR-XR) 150 MG 24 hr capsule, TAKE 1 BY MOUTH EVERY DAY WITH BREAKFAST, Disp: 90 capsule, Rfl: 1 .  zolpidem (AMBIEN) 5 MG tablet, TAKE 1 TABLET BY MOUTH EVERY DAY AT BEDTIME AS NEEDED FOR SLEEP, Disp: 30 tablet, Rfl: 3  Review of Systems: Denies appetite changes, fevers, chills, fatigue, unexplained weight changes. Denies hearing loss, neck lumps or masses, mouth sores, ringing in ears or voice changes. Denies cough or wheezing.  Denies shortness of breath. Denies chest pain or palpitations. Denies leg swelling. Denies abdominal distention, pain, blood in stools, constipation, diarrhea, nausea, vomiting, or early satiety. Denies pain with intercourse, dysuria, frequency, hematuria or incontinence. Denies hot flashes, pelvic pain, vaginal bleeding or vaginal discharge.   Denies joint pain, back pain or muscle pain/cramps. Denies itching, rash, or wounds. Denies dizziness, headaches, numbness or seizures. Denies swollen lymph nodes or glands, denies easy bruising or bleeding. Denies anxiety, depression, confusion, or decreased concentration.  Physical Exam: BP (!) 142/83 (BP Location: Left Arm, Patient Position: Sitting)   Pulse 95   Temp 98.4 F (36.9 C) (Temporal)   Resp 17   Ht 5' 3.5" (1.613 m)   Wt 171 lb 3.2 oz (77.7 kg)   SpO2 98%   BMI 29.85 kg/m  General: Alert, oriented, no acute distress. HEENT: Normocephalic, atraumatic, sclera anicteric. Chest: Unlabored breathing on room air. Abdomen: Obese, soft, nontender.  Normoactive bowel sounds.  No masses or hepatosplenomegaly appreciated.   Well-healing incisions, remaining Dermabond removed. Extremities: Grossly normal range of motion.  Warm, well perfused.  No edema bilaterally. Skin: No rashes or lesions noted.  Laboratory & Radiologic Studies: A. BILATERAL OVARIES AND FALLOPIAN TUBES, SALPINGO-OOPHORECTOMY:  - Bilateral ovaries:    Fibrous adhesions.    No endometriosis or evidence of malignancy.   - Bilateral fallopian tubes:    Benign paratubal cyst.    No endometriosis or evidence of malignancy.   Assessment & Plan: Erin Fields is a 54 y.o. woman with ER positive breast cancer s/p therapeutic BSO.  Patient is overall healing well from surgery.  Reviewed final pathology again and patient was given a paper copy of her pathology report.  She will continue to follow with her medical oncologist for her breast cancer.  From a gynecologic standpoint, we discussed that she is being released back to her general gynecologist and should continue seeing this person for routine GYN health care needs.  Questions and concerns were answered today.  15 minutes of total time was spent for this patient encounter, including preparation, face-to-face counseling with the patient and coordination of care, and documentation of the encounter.  Jeral Pinch, MD  Division of Gynecologic Oncology  Department of Obstetrics and Gynecology  [facility]

## 2019-09-19 ENCOUNTER — Encounter: Payer: Self-pay | Admitting: Gynecologic Oncology

## 2019-09-19 ENCOUNTER — Other Ambulatory Visit: Payer: Self-pay

## 2019-09-19 ENCOUNTER — Inpatient Hospital Stay: Payer: No Typology Code available for payment source | Attending: Hematology | Admitting: Gynecologic Oncology

## 2019-09-19 VITALS — BP 142/83 | HR 95 | Temp 98.4°F | Resp 17 | Ht 63.5 in | Wt 171.2 lb

## 2019-09-19 DIAGNOSIS — C50411 Malignant neoplasm of upper-outer quadrant of right female breast: Secondary | ICD-10-CM | POA: Insufficient documentation

## 2019-09-19 DIAGNOSIS — Z170 Estrogen receptor positive status [ER+]: Secondary | ICD-10-CM

## 2019-09-19 DIAGNOSIS — Z4002 Encounter for prophylactic removal of ovary: Secondary | ICD-10-CM | POA: Insufficient documentation

## 2019-09-19 NOTE — Patient Instructions (Signed)
You are healing very well from surgery!  I am releasing you from my care and you can continue to follow with your regular gynecologist for other female health care needs (like pap testing).  If you have any questions or would like to see me in the future, please call the clinic at [phone].

## 2019-09-24 ENCOUNTER — Other Ambulatory Visit: Payer: Self-pay | Admitting: Family Medicine

## 2019-09-24 DIAGNOSIS — B001 Herpesviral vesicular dermatitis: Secondary | ICD-10-CM

## 2019-10-05 NOTE — Progress Notes (Addendum)
[place] [facility] [address] [place], [place] [address] [phone]  Date:  10/08/2019   Name:  Erin Fields   DOB:  1965-09-28   MRN:  [id number]  PCP:  Darreld Mclean, MD    Chief Complaint: would like to discuss sugar and iron levels   History of Present Illness:  Erin Fields is a 54 y.o. very pleasant female patient who presents with the following:  Here today for a follow-up visit  History of UC, breast cancer 06-14-2016, hypothyroidism, HTN, hyperlipidemia, DM, IgG deficiency followed by [facility] Last seen by myself in March of this year .  At that time her A1c was slightly high at 8.2% We had her increase Metformin dose- she is tolerating this ok, 500 BID  She has started on humura recently for her ulcerative colitis.  This is working well for her so far.  She has noted improvement in her fecal urgency.  No recent blood in her stool  Dr. Earlean Shawl is her GI provider Underwent BSO in May due to history of breast cancer- this went well.  She does have a uterus and cervix still - would like me to take over her pap which is fine, not due yet   She is also suffered personal tragedy-her husband died in an accident 06/14/2016, daughter died of overdose in 15-Jun-2015.  Her son is a Ship broker at [employer] but is taking some time off due to covid 19- he is working at [employer].  He plans to work for another year and then go back in a year or so   COVID-19 series- done!  shingirix done   She does check her home BP periodically; can sometimes be high  yesterday 150/89 139/89 139/87  She is taking benicar 40  Lab Results  Component Value Date   TSH 1.28 06/18/2019     Patient Active Problem List   Diagnosis Date Noted  . Anxiety and depression 01/20/2018  . Right upper lobe pulmonary nodule 01/10/2018  . Nasal polyp, unspecified 01/10/2018  . Left sided abdominal pain 12/13/2017  . Impaired fasting glucose 12/12/2017  . History of  adenomatous polyp of colon 11/19/2017  . Genetic testing 07/16/2017  . Family history of breast cancer   . Controlled type 2 diabetes mellitus without complication, without long-term current use of insulin ([place]) 05/28/2017  . Malignant neoplasm of upper-outer quadrant of right breast in female, estrogen receptor positive ([place]) 03/20/2017  . Crohn disease ([place]) 11/29/2016  . Borderline diabetes 12/17/2015  . Ex-smoker 11/18/2015  . Chronic venous insufficiency 09/02/2015  . Symptomatic varicose veins, right 09/02/2015  . Eustachian tube dysfunction 12/04/2014  . Allergic rhinitis 09/15/2014  . Dyspnea 09/15/2014  . Liver hemangioma 08/24/2014  . Elevated triglycerides with high cholesterol 11/26/2013  . Insomnia 08/20/2013  . Hypertension 08/20/2013  . Ulcerative colitis ([place]) 04/20/2013  . Oral herpes 04/20/2013  . Hyperlipidemia 12/06/2006  . HEADACHE 12/06/2006  . CERVICAL CANCER, HX OF 12/06/2006  . Hypothyroidism 11/29/2006  . Asthma 11/29/2006  . IRRITABLE BOWEL SYNDROME 11/29/2006    Past Medical History:  Diagnosis Date  . Anxiety   . Asthma   . Cancer [facility]) 2017/06/14   right breast cancer  . Colon polyps   . Depression   . Diabetes mellitus without complication ([place])    type 2  . Environmental allergies   . GERD (gastroesophageal reflux disease)   . Hyperlipidemia   .  Hypertension   . Hypothyroidism   . Migraines   . Personal history of radiation therapy 2019   36 radiation tx  . PONV (postoperative nausea and vomiting)    scopolamine patch helps  . Ulcerative colitis ([place])   . Varicose veins     Past Surgical History:  Procedure Laterality Date  . BREAST BIOPSY Right 02/2017  . BREAST LUMPECTOMY WITH RADIOACTIVE SEED AND SENTINEL LYMPH NODE BIOPSY Right 04/24/2017   Procedure: RIGHT BREAST LUMPECTOMY WITH RADIOACTIVE SEED AND SENTINEL LYMPH NODE BIOPSY;  Surgeon: [name], MD;  Location: [place];  Service: General;  Laterality: Right;   . crapal tunnel relase Left 05/2018  . ENDOMETRIAL ABLATION  2010   Had abnormal uterine bleeding (heavy and frequent), performed in [place], menses stopped completely 2 years after  . EXPLORATORY LAPAROTOMY     Had exploratory surgery at the age of 84 for abdominal pain with cyst found on her left ovary, not treated surgically  . FOOT SURGERY Right 2012  . HAND SURGERY Right   . RE-EXCISION OF BREAST LUMPECTOMY Right 05/15/2017   Procedure: RE-EXCISION OF BREAST LUMPECTOMY;  Surgeon: [name], MD;  Location: [place];  Service: General;  Laterality: Right;  . ROBOTIC ASSISTED SALPINGO OOPHERECTOMY Bilateral 08/29/2019   Procedure: XI ROBOTIC ASSISTED SALPINGO OOPHORECTOMY;  Surgeon: [name], MD;  Location: [facility];  Service: Gynecology;  Laterality: Bilateral;  . Septoplasty with turbinate reduction    . TONSILLECTOMY  1980  . VEIN SURGERY     Removal Right Leg  . WISDOM TOOTH EXTRACTION      Social History   Tobacco Use  . Smoking status: Former Smoker    Packs/day: 3.00    Years: 15.00    Pack years: 45.00    Types: Cigarettes    Quit date: 09/14/1993    Years since quitting: 26.0  . Smokeless tobacco: Never Used  . Tobacco comment: Quit >20 yrs ago  Vaping Use  . Vaping Use: Never used  Substance Use Topics  . Alcohol use: Yes    Comment: Drinks a couple every 2-3 months  . Drug use: No    Family History  Problem Relation Age of Onset  . Arthritis Mother 15       Deceased  . Breast cancer Mother   . Lung cancer Mother   . Hyperlipidemia Mother   . Heart disease Mother   . Hypertension Mother   . Diabetes Mother   . Crohn's disease Mother   . Diabetes Maternal Grandfather   . Heart disease Maternal Grandfather   . Heart attack Maternal Grandfather   . Leukemia Maternal Grandfather   . Colitis Sister   . Leukemia Cousin 3       mat cousin  . Lung cancer Father   . Brain cancer Father     Allergies   Allergen Reactions  . Ace Inhibitors Other (See Comments)    Cough  . Aspirin     Upset stomach  . Cefaclor     hives  . Losartan Potassium   . Oxycodone Hives  . Sulfa Antibiotics Other (See Comments)    Hives    Medication list has been reviewed and updated.  Current Outpatient Medications on File Prior to Visit  Medication Sig Dispense Refill  . acyclovir (ZOVIRAX) 400 MG tablet TAKE 1 TABLET (400 MG TOTAL) BY MOUTH 3 (THREE) TIMES DAILY AS NEEDED. 270 tablet 1  . Adalimumab (HUMIRA  PEN) 40 MG/0.4ML PNKT Inject into the skin.    Marland Kitchen azelastine (ASTELIN) 0.1 % nasal spray 2 sprays 2 (two) times daily.  6  . budesonide (ENTOCORT EC) 3 MG 24 hr capsule Take 9 mg by mouth every evening.     . budesonide-formoterol (SYMBICORT) 160-4.5 MCG/ACT inhaler Inhale 2 puffs into the lungs 2 (two) times daily.    Marland Kitchen EPINEPHrine 0.3 mg/0.3 mL IJ SOAJ injection epinephrine 0.3 mg/0.3 mL injection, auto-injector  TK UTD PRN    . exemestane (AROMASIN) 25 MG tablet Take 1 tablet (25 mg total) by mouth daily after breakfast. 90 tablet 1  . Hyoscyamine Sulfate SL 0.125 MG SUBL hyoscyamine 0.125 mg sublingual tablet  TAKE 1 TABLET (0.125 MG TOTAL) BY MOUTH EVERY 6 HOURS AS NEEDED FOR UP TO 30 DAYS FOR CRAMPING.    . lansoprazole (PREVACID) 30 MG capsule Take 30 mg by mouth daily at 12 noon.    . levalbuterol (XOPENEX HFA) 45 MCG/ACT inhaler Inhale 2 puffs into the lungs every 4 (four) hours as needed for wheezing (Q4-6 Hours PRN).    Marland Kitchen levothyroxine (SYNTHROID) 88 MCG tablet TAKE 1 TABLET BY MOUTH EVERY DAY 90 tablet 1  . mesalamine (LIALDA) 1.2 G EC tablet Take 2.4 g by mouth in the morning and at bedtime.     . metFORMIN (GLUCOPHAGE) 500 MG tablet Take 1 tablet (500 mg total) by mouth 2 (two) times daily with a meal. 180 tablet 3  . montelukast (SINGULAIR) 10 MG tablet Take 10 mg by mouth at bedtime.     Marland Kitchen olmesartan (BENICAR) 40 MG tablet Take 1 tablet (40 mg total) by mouth daily. 90 tablet 1  .  promethazine (PHENERGAN) 25 MG tablet TAKE 1/2-1 TABLET DAILY AS NEEDED FOR NAUSEA 30 tablet 5  . rosuvastatin (CRESTOR) 20 MG tablet Take 1 tablet (20 mg total) by mouth daily. (Patient taking differently: Take 20 mg by mouth at bedtime. ) 90 tablet 3  . triamcinolone (NASACORT ALLERGY 24HR) 55 MCG/ACT AERO nasal inhaler Place 1 spray into the nose daily.    Marland Kitchen UNABLE TO FIND mecalamine support bid    . venlafaxine XR (EFFEXOR-XR) 150 MG 24 hr capsule TAKE 1 BY MOUTH EVERY DAY WITH BREAKFAST 90 capsule 1  . zolpidem (AMBIEN) 5 MG tablet TAKE 1 TABLET BY MOUTH EVERY DAY AT BEDTIME AS NEEDED FOR SLEEP 30 tablet 3   No current facility-administered medications on file prior to visit.    Review of Systems:  As per HPI- otherwise negative.   Physical Examination: Vitals:   10/08/19 0903  BP: 136/70  Pulse: 87  Resp: 12  Temp: 98.2 F (36.8 C)  SpO2: 97%   Vitals:   10/08/19 0903  Weight: 168 lb 12.8 oz (76.6 kg)  Height: 5' 3.5" (1.613 m)   Body mass index is 29.43 kg/m. Ideal Body Weight: Weight in (lb) to have BMI = 25: 143.1  GEN: no acute distress.  Overweight, looks well   Bilateral TM wnl, oropharynx normal.  PEERL,EOMI.   HEENT: Atraumatic, Normocephalic.  Ears and Nose: No external deformity. CV: RRR, No M/G/R. No JVD. No thrill. No extra heart sounds. PULM: CTA B, no wheezes, crackles, rhonchi. No retractions. No resp. distress. No accessory muscle use. ABD: S, NT, ND, +BS. No rebound. No HSM. EXTR: No c/c/e PSYCH: Normally interactive. Conversant.    Assessment and Plan: Mixed hyperlipidemia  Hypothyroidism due to acquired atrophy of thyroid  Controlled type 2 diabetes mellitus without complication,  without long-term current use of insulin ([place]) - Plan: Hemoglobin A1c  Essential hypertension  Ulcerative pancolitis without complication (HCC)  Iron deficiency anemia due to chronic blood loss - Plan: CBC, Ferritin  Following up today for a routine  checkup  Synia has tended to have iron deficiency anemia due to her ulcerative pancolitis.  She has not seen any blood in her stool in some time and hopes that her iron will be better today.  We will recheck an iron and CBC.  She recently started Humira which is to be helping with her colitis  A1c to follow-up on diabetes-we increased her Metformin dose at last labs  TSH check is up-to-date  Pattiann has noticed some borderline blood pressure readings at home.  I asked her to continue to monitor this, if she is routinely going higher than 140/90 she will let me know we can adjust her medications  Will plan further follow- up pending labs. Moderate medical decision making today This visit occurred during the SARS-CoV-2 public health emergency.  Safety protocols were in place, including screening questions prior to the visit, additional usage of staff PPE, and extensive cleaning of exam room while observing appropriate contact time as indicated for disinfecting solutions.    Signed Lamar Blinks, MD  Received her labs as below, message to patient Her A1c has improved and is now in desired range Results for orders placed or performed in visit on 10/08/19  CBC  Result Value Ref Range   WBC 7.4 4.0 - 10.5 K/uL   RBC 4.28 3.87 - 5.11 Mil/uL   Platelets 413.0 (H) 150 - 400 K/uL   Hemoglobin 11.9 (L) 12.0 - 15.0 g/dL   HCT 36.3 36 - 46 %   MCV 84.8 78.0 - 100.0 fl   MCHC 32.9 30.0 - 36.0 g/dL   RDW 14.6 11.5 - 15.5 %  Hemoglobin A1c  Result Value Ref Range   Hgb A1c MFr Bld 7.3 (H) 4.6 - 6.5 %  Ferritin  Result Value Ref Range   Ferritin 7.0 (L) 10.0 - 291.0 ng/mL

## 2019-10-07 NOTE — Patient Instructions (Addendum)
It was good to see you again today!  I will be in touch with your labs Assuming all ok we can plan for 6 months

## 2019-10-08 ENCOUNTER — Encounter: Payer: Self-pay | Admitting: Family Medicine

## 2019-10-08 ENCOUNTER — Other Ambulatory Visit: Payer: Self-pay

## 2019-10-08 ENCOUNTER — Ambulatory Visit (INDEPENDENT_AMBULATORY_CARE_PROVIDER_SITE_OTHER): Payer: No Typology Code available for payment source | Admitting: Family Medicine

## 2019-10-08 VITALS — BP 136/70 | HR 87 | Temp 98.2°F | Resp 12 | Ht 63.5 in | Wt 168.8 lb

## 2019-10-08 DIAGNOSIS — E034 Atrophy of thyroid (acquired): Secondary | ICD-10-CM | POA: Diagnosis not present

## 2019-10-08 DIAGNOSIS — E119 Type 2 diabetes mellitus without complications: Secondary | ICD-10-CM | POA: Diagnosis not present

## 2019-10-08 DIAGNOSIS — E782 Mixed hyperlipidemia: Secondary | ICD-10-CM

## 2019-10-08 DIAGNOSIS — I10 Essential (primary) hypertension: Secondary | ICD-10-CM | POA: Diagnosis not present

## 2019-10-08 DIAGNOSIS — D500 Iron deficiency anemia secondary to blood loss (chronic): Secondary | ICD-10-CM

## 2019-10-08 DIAGNOSIS — K5100 Ulcerative (chronic) pancolitis without complications: Secondary | ICD-10-CM

## 2019-10-08 LAB — CBC
HCT: 36.3 % (ref 36.0–46.0)
Hemoglobin: 11.9 g/dL — ABNORMAL LOW (ref 12.0–15.0)
MCHC: 32.9 g/dL (ref 30.0–36.0)
MCV: 84.8 fl (ref 78.0–100.0)
Platelets: 413 10*3/uL — ABNORMAL HIGH (ref 150.0–400.0)
RBC: 4.28 Mil/uL (ref 3.87–5.11)
RDW: 14.6 % (ref 11.5–15.5)
WBC: 7.4 10*3/uL (ref 4.0–10.5)

## 2019-10-08 LAB — FERRITIN: Ferritin: 7 ng/mL — ABNORMAL LOW (ref 10.0–291.0)

## 2019-10-08 LAB — HEMOGLOBIN A1C: Hgb A1c MFr Bld: 7.3 % — ABNORMAL HIGH (ref 4.6–6.5)

## 2019-10-30 ENCOUNTER — Other Ambulatory Visit: Payer: Self-pay | Admitting: Hematology

## 2019-10-30 DIAGNOSIS — Z170 Estrogen receptor positive status [ER+]: Secondary | ICD-10-CM

## 2019-10-30 DIAGNOSIS — C50411 Malignant neoplasm of upper-outer quadrant of right female breast: Secondary | ICD-10-CM

## 2019-11-03 ENCOUNTER — Ambulatory Visit
Admission: RE | Admit: 2019-11-03 | Discharge: 2019-11-03 | Disposition: A | Discharge status: Home / Self Care | Payer: No Typology Code available for payment source | Source: Ambulatory Visit | Attending: Hematology | Admitting: Hematology

## 2019-11-03 ENCOUNTER — Other Ambulatory Visit: Payer: Self-pay

## 2019-11-03 ENCOUNTER — Other Ambulatory Visit: Payer: Self-pay | Admitting: Hematology

## 2019-11-03 DIAGNOSIS — R921 Mammographic calcification found on diagnostic imaging of breast: Secondary | ICD-10-CM

## 2019-11-03 DIAGNOSIS — Z170 Estrogen receptor positive status [ER+]: Secondary | ICD-10-CM

## 2019-11-03 DIAGNOSIS — C50411 Malignant neoplasm of upper-outer quadrant of right female breast: Secondary | ICD-10-CM

## 2019-11-04 ENCOUNTER — Telehealth: Payer: Self-pay

## 2019-11-04 NOTE — Telephone Encounter (Signed)
-----   Message from [name], MD sent at 11/04/2019 10:37 AM EDT ----- Please let pt know her DEXA is normal. I noticed that she is scheduled for breast biopsy next week, please let pt call us if her biopsy is abnormal, thanks   [name]  11/04/2019

## 2019-11-04 NOTE — Telephone Encounter (Signed)
Called Ms. Boruff and LM asking her to call back for bone scan results.

## 2019-11-04 NOTE — Telephone Encounter (Signed)
Erin Fields called back and I notified her that her DEXA scan was normal and that Dr. Burr Medico noticed that she has an breast biopsy next week and wanted to remind her to call us if the biopsy came back abnormal. Pt Verbalized understanding.

## 2019-11-06 ENCOUNTER — Encounter: Payer: Self-pay | Admitting: Family

## 2019-11-06 ENCOUNTER — Inpatient Hospital Stay: Payer: No Typology Code available for payment source

## 2019-11-06 ENCOUNTER — Other Ambulatory Visit: Payer: Self-pay

## 2019-11-06 ENCOUNTER — Inpatient Hospital Stay: Payer: No Typology Code available for payment source | Attending: Hematology | Admitting: Family

## 2019-11-06 VITALS — BP 135/70 | HR 96

## 2019-11-06 VITALS — BP 150/87 | HR 100 | Temp 98.7°F | Resp 18 | Ht 63.5 in | Wt 169.4 lb

## 2019-11-06 DIAGNOSIS — Z170 Estrogen receptor positive status [ER+]: Secondary | ICD-10-CM

## 2019-11-06 DIAGNOSIS — D500 Iron deficiency anemia secondary to blood loss (chronic): Secondary | ICD-10-CM

## 2019-11-06 DIAGNOSIS — K5190 Ulcerative colitis, unspecified, without complications: Secondary | ICD-10-CM | POA: Insufficient documentation

## 2019-11-06 DIAGNOSIS — D649 Anemia, unspecified: Secondary | ICD-10-CM

## 2019-11-06 DIAGNOSIS — C50411 Malignant neoplasm of upper-outer quadrant of right female breast: Secondary | ICD-10-CM

## 2019-11-06 DIAGNOSIS — D509 Iron deficiency anemia, unspecified: Secondary | ICD-10-CM

## 2019-11-06 HISTORY — DX: Iron deficiency anemia, unspecified: D50.9

## 2019-11-06 LAB — CBC WITH DIFFERENTIAL (CANCER CENTER ONLY)
Abs Immature Granulocytes: 0.09 10*3/uL — ABNORMAL HIGH (ref 0.00–0.07)
Basophils Absolute: 0.1 10*3/uL (ref 0.0–0.1)
Basophils Relative: 1 %
Eosinophils Absolute: 0.2 10*3/uL (ref 0.0–0.5)
Eosinophils Relative: 2 %
HCT: 39.4 % (ref 36.0–46.0)
Hemoglobin: 12.3 g/dL (ref 12.0–15.0)
Immature Granulocytes: 1 %
Lymphocytes Relative: 33 %
Lymphs Abs: 3.2 10*3/uL (ref 0.7–4.0)
MCH: 26.8 pg (ref 26.0–34.0)
MCHC: 31.2 g/dL (ref 30.0–36.0)
MCV: 85.8 fL (ref 80.0–100.0)
Monocytes Absolute: 1.2 10*3/uL — ABNORMAL HIGH (ref 0.1–1.0)
Monocytes Relative: 13 %
Neutro Abs: 4.9 10*3/uL (ref 1.7–7.7)
Neutrophils Relative %: 50 %
Platelet Count: 456 10*3/uL — ABNORMAL HIGH (ref 150–400)
RBC: 4.59 MIL/uL (ref 3.87–5.11)
RDW: 13.5 % (ref 11.5–15.5)
WBC Count: 9.7 10*3/uL (ref 4.0–10.5)
nRBC: 0 % (ref 0.0–0.2)

## 2019-11-06 LAB — CMP (CANCER CENTER ONLY)
ALT: 12 U/L (ref 0–44)
AST: 10 U/L — ABNORMAL LOW (ref 15–41)
Albumin: 4.6 g/dL (ref 3.5–5.0)
Alkaline Phosphatase: 57 U/L (ref 38–126)
Anion gap: 8 (ref 5–15)
BUN: 6 mg/dL (ref 6–20)
CO2: 29 mmol/L (ref 22–32)
Calcium: 9.9 mg/dL (ref 8.9–10.3)
Chloride: 101 mmol/L (ref 98–111)
Creatinine: 0.67 mg/dL (ref 0.44–1.00)
GFR, Est AFR Am: >60 mL/min (ref >60)
GFR, Estimated: >60 mL/min (ref >60)
Glucose, Bld: 134 mg/dL — ABNORMAL HIGH (ref 70–99)
Potassium: 3.8 mmol/L (ref 3.5–5.1)
Sodium: 138 mmol/L (ref 135–145)
Total Bilirubin: 0.7 mg/dL (ref 0.3–1.2)
Total Protein: 6.7 g/dL (ref 6.5–8.1)

## 2019-11-06 MED ORDER — SODIUM CHLORIDE 0.9 % IV SOLN
200.0000 mg | Freq: Once | INTRAVENOUS | Status: AC
  Administered 2019-11-06: 200 mg via INTRAVENOUS
  Filled 2019-11-06: qty 200

## 2019-11-06 MED ORDER — SODIUM CHLORIDE 0.9 % IV SOLN
Freq: Once | INTRAVENOUS | Status: AC
  Filled 2019-11-06: qty 250

## 2019-11-06 NOTE — Patient Instructions (Signed)

## 2019-11-06 NOTE — Progress Notes (Signed)
Hematology/Oncology Consultation   Name: Erin Fields      MRN: [id number]    Location: Room/bed info not found  Date: 11/06/2019 Time:1:35 PM   REFERRING PHYSICIAN: Silvestre Mesi, MD  REASON FOR CONSULT: Iron deficiency anemia due to chronic blood loss    DIAGNOSIS: Iron deficiency anemia secondary to intermittent GI blood loss (UC)  HISTORY OF PRESENT ILLNESS:  Erin Fields is a very pleasant 54 yo caucasian female with iron deficiency anemia secondary to GI blood loss with ulcerative colitis flares. She states that she has been in a flare for over 15 months now.  She is currently on Entocort EC, Lialda and most recently started Humira. She states that she is still in a flare and has not noted any improvement yet. She is followed by Dr. Earlean Shawl with GI.  She notes a good bit of blood in her stool and has 6 or more loose bowel movements a day.   No other blood loss noted. She bruises easily but not in excess.  Ferritin is 7. She is symptomatic with occasional dizziness, SOB with exertion, fatigue, weakness and chills. She has history of right breast cancer (stage IA (T1, N0, M0, G2), ER positive, PR positive, HER 2 negative) diagnosed and treated in 2019. She had a lumpectomy followed by 35 fractions of radiation and is now on Aromasin. She also had both her ovaries removed prophylactically. She continues to follow-up with her oncologist Dr. Burr Medico. She is scheduled for biopsy of a right breast calcification tomorrow.   She has 1 son and history of 1 miscarriage in first few weeks of pregnancy. She did take an iron supplement during pregnancy and had anemia as a child.  No fever, n/v, cough, rash, dizziness, chest pain, palpitations, abdominal pain or changes in bowel or bladder habits.  No swelling, tenderness, numbness or tingling in her extremities.  No falls or syncope.  No smoking, no recreational drugs, no ETOH.  She has maintained a good appetite and is staying well hydrated. Her weight is  described as stable.  She works as an Visual merchandiser.   ROS: All other 10 point review of systems is negative.   PAST MEDICAL HISTORY:   Past Medical History:  Diagnosis Date  . Anxiety   . Asthma   . Cancer Erin Fields) 2019   right breast cancer  . Colon polyps   . Depression   . Diabetes mellitus without complication ([place])    type 2  . Environmental allergies   . GERD (gastroesophageal reflux disease)   . Hyperlipidemia   . Hypertension   . Hypothyroidism   . Migraines   . Personal history of radiation therapy 2019   36 radiation tx  . PONV (postoperative nausea and vomiting)    scopolamine patch helps  . Ulcerative colitis ([place])   . Varicose veins     ALLERGIES: Allergies  Allergen Reactions  . Aspirin Other (See Comments)    Upset stomach  . Cefaclor Hives  . Oxycodone Hives  . Ace Inhibitors Other (See Comments)    Cough  . Losartan Potassium   . Sulfa Antibiotics Other (See Comments)    Hives      MEDICATIONS:  Current Outpatient Medications on File Prior to Visit  Medication Sig Dispense Refill  . acyclovir (ZOVIRAX) 400 MG tablet TAKE 1 TABLET (400 MG TOTAL) BY MOUTH 3 (THREE) TIMES DAILY AS NEEDED. 270 tablet 1  . Adalimumab (HUMIRA PEN) 40 MG/0.4ML PNKT Inject into the skin.    Marland Kitchen  azelastine (ASTELIN) 0.1 % nasal spray 2 sprays 2 (two) times daily.  6  . budesonide (ENTOCORT EC) 3 MG 24 hr capsule Take 9 mg by mouth every evening.     . budesonide-formoterol (SYMBICORT) 160-4.5 MCG/ACT inhaler Inhale 2 puffs into the lungs 2 (two) times daily.    Marland Kitchen EPINEPHrine 0.3 mg/0.3 mL IJ SOAJ injection epinephrine 0.3 mg/0.3 mL injection, auto-injector  TK UTD PRN    . exemestane (AROMASIN) 25 MG tablet TAKE 1 TABLET (25 MG TOTAL) BY MOUTH DAILY AFTER BREAKFAST. 90 tablet 1  . Hyoscyamine Sulfate SL 0.125 MG SUBL hyoscyamine 0.125 mg sublingual tablet  TAKE 1 TABLET (0.125 MG TOTAL) BY MOUTH EVERY 6 HOURS AS NEEDED FOR UP TO 30 DAYS FOR CRAMPING.    .  lansoprazole (PREVACID) 30 MG capsule Take 30 mg by mouth daily at 12 noon.    . levalbuterol (XOPENEX HFA) 45 MCG/ACT inhaler Inhale 2 puffs into the lungs every 4 (four) hours as needed for wheezing (Q4-6 Hours PRN).    Marland Kitchen levothyroxine (SYNTHROID) 88 MCG tablet TAKE 1 TABLET BY MOUTH EVERY DAY 90 tablet 1  . mesalamine (LIALDA) 1.2 G EC tablet Take 2.4 g by mouth in the morning and at bedtime.     . metFORMIN (GLUCOPHAGE) 500 MG tablet Take 1 tablet (500 mg total) by mouth 2 (two) times daily with a meal. 180 tablet 3  . montelukast (SINGULAIR) 10 MG tablet Take 10 mg by mouth at bedtime.     Marland Kitchen olmesartan (BENICAR) 40 MG tablet Take 1 tablet (40 mg total) by mouth daily. 90 tablet 1  . promethazine (PHENERGAN) 25 MG tablet TAKE 1/2-1 TABLET DAILY AS NEEDED FOR NAUSEA 30 tablet 5  . rosuvastatin (CRESTOR) 20 MG tablet Take 1 tablet (20 mg total) by mouth daily. (Patient taking differently: Take 20 mg by mouth at bedtime. ) 90 tablet 3  . triamcinolone (NASACORT ALLERGY 24HR) 55 MCG/ACT AERO nasal inhaler Place 1 spray into the nose daily.    Marland Kitchen UNABLE TO FIND mecalamine support bid    . venlafaxine XR (EFFEXOR-XR) 150 MG 24 hr capsule TAKE 1 BY MOUTH EVERY DAY WITH BREAKFAST 90 capsule 1  . zolpidem (AMBIEN) 5 MG tablet TAKE 1 TABLET BY MOUTH EVERY DAY AT BEDTIME AS NEEDED FOR SLEEP 30 tablet 3   No current facility-administered medications on file prior to visit.     PAST SURGICAL HISTORY Past Surgical History:  Procedure Laterality Date  . BREAST BIOPSY Right 02/2017  . BREAST LUMPECTOMY WITH RADIOACTIVE SEED AND SENTINEL LYMPH NODE BIOPSY Right 04/24/2017   Procedure: RIGHT BREAST LUMPECTOMY WITH RADIOACTIVE SEED AND SENTINEL LYMPH NODE BIOPSY;  Surgeon: [name], MD;  Location: [facility];  Service: General;  Laterality: Right;  . crapal tunnel relase Left 05/2018  . ENDOMETRIAL ABLATION  2010   Had abnormal uterine bleeding (heavy and frequent), performed in [place],  menses stopped completely 2 years after  . EXPLORATORY LAPAROTOMY     Had exploratory surgery at the age of 70 for abdominal pain with cyst found on her left ovary, not treated surgically  . FOOT SURGERY Right 2012  . HAND SURGERY Right   . RE-EXCISION OF BREAST LUMPECTOMY Right 05/15/2017   Procedure: RE-EXCISION OF BREAST LUMPECTOMY;  Surgeon: [name], MD;  Location: [facility];  Service: General;  Laterality: Right;  . ROBOTIC ASSISTED SALPINGO OOPHERECTOMY Bilateral 08/29/2019   Procedure: XI ROBOTIC ASSISTED SALPINGO OOPHORECTOMY;  Surgeon: Jeral Pinch  R, MD;  Location: [facility];  Service: Gynecology;  Laterality: Bilateral;  . Septoplasty with turbinate reduction    . TONSILLECTOMY  1980  . VEIN SURGERY     Removal Right Leg  . WISDOM TOOTH EXTRACTION      FAMILY HISTORY: Family History  Problem Relation Age of Onset  . Arthritis Mother 35       Deceased  . Breast cancer Mother   . Lung cancer Mother   . Hyperlipidemia Mother   . Heart disease Mother   . Hypertension Mother   . Diabetes Mother   . Crohn's disease Mother   . Diabetes Maternal Grandfather   . Heart disease Maternal Grandfather   . Heart attack Maternal Grandfather   . Leukemia Maternal Grandfather   . Colitis Sister   . Leukemia Cousin 3       mat cousin  . Lung cancer Father   . Brain cancer Father     SOCIAL HISTORY:  reports that she quit smoking about 26 years ago. Her smoking use included cigarettes. She has a 45.00 pack-year smoking history. She has never used smokeless tobacco. She reports current alcohol use. She reports that she does not use drugs.  PERFORMANCE STATUS: The patient's performance status is 1 - Symptomatic but completely ambulatory  PHYSICAL EXAM: Most Recent Vital Signs: There were no vitals taken for this visit. BP (!) 150/87 (BP Location: Left Arm, Patient Position: Sitting)   Pulse 100   Temp 98.7 F (37.1 C) (Oral)   Resp 18    Ht 5' 3.5" (1.613 m)   Wt 169 lb 6.4 oz (76.8 kg)   SpO2 98%   BMI 29.54 kg/m   General Appearance:    Alert, cooperative, no distress, appears stated age  Head:    Normocephalic, without obvious abnormality, atraumatic  Eyes:    PERRL, conjunctiva/corneas clear, EOM's intact, fundi    benign, both eyes        Throat:   Lips, mucosa, and tongue normal; teeth and gums normal  Neck:   Supple, symmetrical, trachea midline, no adenopathy;    thyroid:  no enlargement/tenderness/nodules; no carotid   bruit or JVD  Back:     Symmetric, no curvature, ROM normal, no CVA tenderness  Lungs:     Clear to auscultation bilaterally, respirations unlabored  Chest Wall:    No tenderness or deformity   Heart:    Regular rate and rhythm, S1 and S2 normal, no murmur, rub   or gallop     Abdomen:     Soft, non-tender, bowel sounds active all four quadrants,    no masses, no organomegaly        Extremities:   Extremities normal, atraumatic, no cyanosis or edema  Pulses:   2+ and symmetric all extremities  Skin:   Skin color, texture, turgor normal, no rashes or lesions  Lymph nodes:   Cervical, supraclavicular, and axillary nodes normal  Neurologic:   CNII-XII intact, normal strength, sensation and reflexes    throughout    LABORATORY DATA:  Results for orders placed or performed in visit on 11/06/19 (from the past 48 hour(s))  CBC with Differential (Cancer Center Only)     Status: Abnormal   Collection Time: 11/06/19  1:00 PM  Result Value Ref Range   WBC Count 9.7 4.0 - 10.5 K/uL   RBC 4.59 3.87 - 5.11 MIL/uL   Hemoglobin 12.3 12.0 - 15.0 g/dL   HCT 39.4 36 -  46 %   MCV 85.8 80.0 - 100.0 fL   MCH 26.8 26.0 - 34.0 pg   MCHC 31.2 30.0 - 36.0 g/dL   RDW 13.5 11.5 - 15.5 %   Platelet Count 456 (H) 150 - 400 K/uL   nRBC 0.0 0.0 - 0.2 %   Neutrophils Relative % 50 %   Neutro Abs 4.9 1.7 - 7.7 K/uL   Lymphocytes Relative 33 %   Lymphs Abs 3.2 0.7 - 4.0 K/uL   Monocytes Relative 13 %    Monocytes Absolute 1.2 (H) 0 - 1 K/uL   Eosinophils Relative 2 %   Eosinophils Absolute 0.2 0 - 0 K/uL   Basophils Relative 1 %   Basophils Absolute 0.1 0 - 0 K/uL   Immature Granulocytes 1 %   Abs Immature Granulocytes 0.09 (H) 0.00 - 0.07 K/uL    Comment: Performed at [facility] at [facility], [address] [place], [place] [address]      RADIOGRAPHY: No results found.     PATHOLOGY: None  ASSESSMENT/PLAN: Ms. Frommelt is a very pleasant 54 yo caucasian female with iron deficiency anemia secondary to GI blood loss with ulcerative colitis flares. She is symptomatic as mentioned above.  We will give her IV iron today followed by another 4 doses.  We will plan to see her again in another 5 weeks to re-evaluate her response.   All questions were answered and she is in agreement with the plan. She can contact our office with any questions or concerns. We can certainly see her sooner if needed.   She was discussed with and also seen by Dr. Marin Olp and he is in agreement with the aforementioned.   Laverna Peace, NP    ADDENDUM: I saw and examined Ms. Burkard with Judson Roch.  She is very very nice.  She clearly has iron deficiency.  I looked at her blood smear under the microscope.  She has some slight microcytic red blood cells.  I do not see any target cells.  She had no nucleated red blood cells.  White blood cells were without hypersegmented polys.  Platelets were adequate number and size.  Surprisingly, she really is not all that anemic.  Her MCV is okay.  Her ferritin back in June, a month ago, was only 7.  I think this clear indicates iron deficiency.  She has a ulcerative colitis.  I am sure she uses blood from the colon.  We will she needs to make sure that she gets routine colonoscopies.  She is definitely at risk for colon cancer and actually other GI cancers because of her ulcerative colitis.  We will go ahead and give her iron today.  We will then  plan for additional iron depending on what her iron studies show.  We spent about 45 minutes with Ms. Berdine Addison.  Again she is very very nice.  It was fun talking to her.  We will plan to get her back to see [place] in another 6 weeks or so and then see how her blood count and iron studies look.  Lattie Haw, MD

## 2019-11-06 NOTE — Progress Notes (Signed)
Okay for Venofer today per Otilio Carpen, [employer].

## 2019-11-07 ENCOUNTER — Ambulatory Visit
Admission: RE | Admit: 2019-11-07 | Discharge: 2019-11-07 | Disposition: A | Discharge status: Home / Self Care | Payer: No Typology Code available for payment source | Source: Ambulatory Visit | Attending: Hematology | Admitting: Hematology

## 2019-11-07 DIAGNOSIS — R921 Mammographic calcification found on diagnostic imaging of breast: Secondary | ICD-10-CM

## 2019-11-07 HISTORY — PX: BREAST BIOPSY: SHX20

## 2019-11-10 ENCOUNTER — Other Ambulatory Visit: Payer: Self-pay

## 2019-11-10 ENCOUNTER — Inpatient Hospital Stay: Payer: No Typology Code available for payment source

## 2019-11-10 ENCOUNTER — Encounter: Payer: Self-pay | Admitting: Hematology

## 2019-11-10 VITALS — BP 150/75 | HR 85 | Temp 98.4°F | Resp 17

## 2019-11-10 DIAGNOSIS — D500 Iron deficiency anemia secondary to blood loss (chronic): Secondary | ICD-10-CM

## 2019-11-10 MED ORDER — SODIUM CHLORIDE 0.9 % IV SOLN
Freq: Once | INTRAVENOUS | Status: AC
  Filled 2019-11-10: qty 250

## 2019-11-10 MED ORDER — SODIUM CHLORIDE 0.9 % IV SOLN
200.0000 mg | Freq: Once | INTRAVENOUS | Status: AC
  Administered 2019-11-10: 200 mg via INTRAVENOUS
  Filled 2019-11-10: qty 200

## 2019-11-10 NOTE — Patient Instructions (Signed)

## 2019-11-11 ENCOUNTER — Ambulatory Visit: Payer: No Typology Code available for payment source

## 2019-11-14 ENCOUNTER — Other Ambulatory Visit: Payer: Self-pay | Admitting: Family Medicine

## 2019-11-14 ENCOUNTER — Inpatient Hospital Stay: Payer: No Typology Code available for payment source

## 2019-11-14 ENCOUNTER — Other Ambulatory Visit: Payer: Self-pay

## 2019-11-14 VITALS — BP 137/67 | HR 92 | Temp 98.4°F | Resp 17

## 2019-11-14 DIAGNOSIS — E034 Atrophy of thyroid (acquired): Secondary | ICD-10-CM

## 2019-11-14 DIAGNOSIS — F430 Acute stress reaction: Secondary | ICD-10-CM

## 2019-11-14 DIAGNOSIS — D500 Iron deficiency anemia secondary to blood loss (chronic): Secondary | ICD-10-CM | POA: Diagnosis not present

## 2019-11-14 MED ORDER — SODIUM CHLORIDE 0.9 % IV SOLN
Freq: Once | INTRAVENOUS | Status: AC
  Filled 2019-11-14: qty 250

## 2019-11-14 MED ORDER — SODIUM CHLORIDE 0.9 % IV SOLN
200.0000 mg | Freq: Once | INTRAVENOUS | Status: AC
  Administered 2019-11-14: 200 mg via INTRAVENOUS
  Filled 2019-11-14: qty 200

## 2019-11-14 NOTE — Patient Instructions (Signed)
\[id number] Sucrose injection What is this medicine? IRON SUCROSE (AHY ern SOO krohs) is an iron complex. Iron is used to make healthy red blood cells, which carry oxygen and nutrients throughout the body. This medicine is used to treat iron deficiency anemia in people with chronic kidney disease. This medicine may be used for other purposes; ask your health care provider or pharmacist if you have questions. COMMON BRAND NAME(S): Venofer What should I tell my health care provider before I take this medicine? They need to know if you have any of these conditions:  anemia not caused by low iron levels  heart disease  high levels of iron in the blood  kidney disease  liver disease  an unusual or allergic reaction to iron, other medicines, foods, dyes, or preservatives  pregnant or trying to get pregnant  breast-feeding How should I use this medicine? This medicine is for infusion into a vein. It is given by a health care professional in a hospital or clinic setting. Talk to your pediatrician regarding the use of this medicine in children. While this drug may be prescribed for children as young as 2 years for selected conditions, precautions do apply. Overdosage: If you think you have taken too much of this medicine contact a poison control center or emergency room at once. NOTE: This medicine is only for you. Do not share this medicine with others. What if I miss a dose? It is important not to miss your dose. Call your doctor or health care professional if you are unable to keep an appointment. What may interact with this medicine? Do not take this medicine with any of the following medications:  deferoxamine  dimercaprol  other iron products This medicine may also interact with the following medications:  chloramphenicol  deferasirox This list may not describe all possible interactions. Give your health care provider a list of all the medicines, herbs, non-prescription  drugs, or dietary supplements you use. Also tell them if you smoke, drink alcohol, or use illegal drugs. Some items may interact with your medicine. What should I watch for while using this medicine? Visit your doctor or healthcare professional regularly. Tell your doctor or healthcare professional if your symptoms do not start to get better or if they get worse. You may need blood work done while you are taking this medicine. You may need to follow a special diet. Talk to your doctor. Foods that contain iron include: whole grains/cereals, dried fruits, beans, or peas, leafy green vegetables, and organ meats (liver, kidney). What side effects may I notice from receiving this medicine? Side effects that you should report to your doctor or health care professional as soon as possible:  allergic reactions like skin rash, itching or hives, swelling of the face, lips, or tongue  breathing problems  changes in blood pressure  cough  fast, irregular heartbeat  feeling faint or lightheaded, falls  fever or chills  flushing, sweating, or hot feelings  joint or muscle aches/pains  seizures  swelling of the ankles or feet  unusually weak or tired Side effects that usually do not require medical attention (report to your doctor or health care professional if they continue or are bothersome):  diarrhea  feeling achy  headache  irritation at site where injected  nausea, vomiting  stomach upset  tiredness This list may not describe all possible side effects. Call your doctor for medical advice about side effects. You may report side effects to FDA at 1-800-FDA-1088. Where should I keep  my medicine? This drug is given in a hospital or clinic and will not be stored at home. NOTE: This sheet is a summary. It may not cover all possible information. If you have questions about this medicine, talk to your doctor, pharmacist, or health care provider.  2020 Elsevier/Gold Standard  (2011-01-12 17:14:35)

## 2019-11-17 ENCOUNTER — Other Ambulatory Visit: Payer: Self-pay

## 2019-11-17 ENCOUNTER — Inpatient Hospital Stay: Payer: No Typology Code available for payment source | Attending: Hematology

## 2019-11-17 VITALS — BP 135/72 | HR 92 | Temp 98.2°F | Resp 17

## 2019-11-17 DIAGNOSIS — Z7952 Long term (current) use of systemic steroids: Secondary | ICD-10-CM | POA: Insufficient documentation

## 2019-11-17 DIAGNOSIS — K51918 Ulcerative colitis, unspecified with other complication: Secondary | ICD-10-CM | POA: Diagnosis present

## 2019-11-17 DIAGNOSIS — Z7984 Long term (current) use of oral hypoglycemic drugs: Secondary | ICD-10-CM | POA: Diagnosis not present

## 2019-11-17 DIAGNOSIS — Z79899 Other long term (current) drug therapy: Secondary | ICD-10-CM | POA: Diagnosis not present

## 2019-11-17 DIAGNOSIS — D500 Iron deficiency anemia secondary to blood loss (chronic): Secondary | ICD-10-CM | POA: Diagnosis not present

## 2019-11-17 MED ORDER — SODIUM CHLORIDE 0.9 % IV SOLN
Freq: Once | INTRAVENOUS | Status: AC
  Filled 2019-11-17: qty 250

## 2019-11-17 MED ORDER — SODIUM CHLORIDE 0.9 % IV SOLN
200.0000 mg | Freq: Once | INTRAVENOUS | Status: AC
  Administered 2019-11-17: 200 mg via INTRAVENOUS
  Filled 2019-11-17: qty 200

## 2019-11-21 ENCOUNTER — Other Ambulatory Visit: Payer: Self-pay

## 2019-11-21 ENCOUNTER — Inpatient Hospital Stay: Payer: No Typology Code available for payment source

## 2019-11-21 VITALS — BP 160/80 | HR 88 | Temp 98.7°F | Resp 17

## 2019-11-21 DIAGNOSIS — D500 Iron deficiency anemia secondary to blood loss (chronic): Secondary | ICD-10-CM

## 2019-11-21 MED ORDER — SODIUM CHLORIDE 0.9 % IV SOLN
200.0000 mg | Freq: Once | INTRAVENOUS | Status: AC
  Administered 2019-11-21: 200 mg via INTRAVENOUS
  Filled 2019-11-21: qty 200

## 2019-11-21 MED ORDER — SODIUM CHLORIDE 0.9 % IV SOLN
Freq: Once | INTRAVENOUS | Status: AC
  Filled 2019-11-21: qty 250

## 2019-11-21 NOTE — Patient Instructions (Signed)

## 2019-11-26 ENCOUNTER — Encounter: Payer: Self-pay | Admitting: Family Medicine

## 2019-11-27 ENCOUNTER — Other Ambulatory Visit: Payer: Self-pay | Admitting: Family Medicine

## 2019-11-27 DIAGNOSIS — E538 Deficiency of other specified B group vitamins: Secondary | ICD-10-CM

## 2019-11-27 HISTORY — DX: Deficiency of other specified B group vitamins: E53.8

## 2019-11-28 ENCOUNTER — Other Ambulatory Visit: Payer: Self-pay

## 2019-11-28 ENCOUNTER — Ambulatory Visit (INDEPENDENT_AMBULATORY_CARE_PROVIDER_SITE_OTHER): Payer: No Typology Code available for payment source

## 2019-11-28 DIAGNOSIS — Z23 Encounter for immunization: Secondary | ICD-10-CM

## 2019-11-28 NOTE — Progress Notes (Signed)
Patient here today for Hep A and Hep B vaccine per Dr. Lorelei Pont. 60m Hep A given in left deltoid IM. 0.5 mL Hep B given in left deltoid IM. Patient states she can only have injections in left arm. [facility] updated. Patient tolerated well.

## 2019-12-10 ENCOUNTER — Other Ambulatory Visit: Payer: Self-pay

## 2019-12-10 ENCOUNTER — Inpatient Hospital Stay (HOSPITAL_BASED_OUTPATIENT_CLINIC_OR_DEPARTMENT_OTHER): Payer: No Typology Code available for payment source | Admitting: Family

## 2019-12-10 ENCOUNTER — Encounter: Payer: Self-pay | Admitting: Family

## 2019-12-10 ENCOUNTER — Inpatient Hospital Stay: Payer: No Typology Code available for payment source

## 2019-12-10 ENCOUNTER — Encounter: Payer: Self-pay | Admitting: Family Medicine

## 2019-12-10 VITALS — BP 125/73 | HR 104 | Temp 98.7°F | Resp 18 | Wt 170.8 lb

## 2019-12-10 DIAGNOSIS — D500 Iron deficiency anemia secondary to blood loss (chronic): Secondary | ICD-10-CM

## 2019-12-10 DIAGNOSIS — D649 Anemia, unspecified: Secondary | ICD-10-CM

## 2019-12-10 LAB — CBC WITH DIFFERENTIAL (CANCER CENTER ONLY)
Abs Immature Granulocytes: 0.29 10*3/uL — ABNORMAL HIGH (ref 0.00–0.07)
Basophils Absolute: 0.1 10*3/uL (ref 0.0–0.1)
Basophils Relative: 0 %
Eosinophils Absolute: 0 10*3/uL (ref 0.0–0.5)
Eosinophils Relative: 0 %
HCT: 42 % (ref 36.0–46.0)
Hemoglobin: 13.5 g/dL (ref 12.0–15.0)
Immature Granulocytes: 2 %
Lymphocytes Relative: 5 %
Lymphs Abs: 0.7 10*3/uL (ref 0.7–4.0)
MCH: 28.6 pg (ref 26.0–34.0)
MCHC: 32.1 g/dL (ref 30.0–36.0)
MCV: 89 fL (ref 80.0–100.0)
Monocytes Absolute: 0.4 10*3/uL (ref 0.1–1.0)
Monocytes Relative: 3 %
Neutro Abs: 13.6 10*3/uL — ABNORMAL HIGH (ref 1.7–7.7)
Neutrophils Relative %: 90 %
Platelet Count: 310 10*3/uL (ref 150–400)
RBC: 4.72 MIL/uL (ref 3.87–5.11)
RDW: 16.3 % — ABNORMAL HIGH (ref 11.5–15.5)
WBC Count: 15.1 10*3/uL — ABNORMAL HIGH (ref 4.0–10.5)
nRBC: 0 % (ref 0.0–0.2)

## 2019-12-10 LAB — CMP (CANCER CENTER ONLY)
ALT: 21 U/L (ref 0–44)
AST: 14 U/L — ABNORMAL LOW (ref 15–41)
Albumin: 4.4 g/dL (ref 3.5–5.0)
Alkaline Phosphatase: 61 U/L (ref 38–126)
Anion gap: 10 (ref 5–15)
BUN: 10 mg/dL (ref 6–20)
CO2: 25 mmol/L (ref 22–32)
Calcium: 9.7 mg/dL (ref 8.9–10.3)
Chloride: 98 mmol/L (ref 98–111)
Creatinine: 0.86 mg/dL (ref 0.44–1.00)
GFR, Est AFR Am: >60 mL/min (ref >60)
GFR, Estimated: >60 mL/min (ref >60)
Glucose, Bld: 320 mg/dL — ABNORMAL HIGH (ref 70–99)
Potassium: 4.6 mmol/L (ref 3.5–5.1)
Sodium: 133 mmol/L — ABNORMAL LOW (ref 135–145)
Total Bilirubin: 1 mg/dL (ref 0.3–1.2)
Total Protein: 6.4 g/dL — ABNORMAL LOW (ref 6.5–8.1)

## 2019-12-10 LAB — SAVE SMEAR(SSMR), FOR PROVIDER SLIDE REVIEW

## 2019-12-10 LAB — RETICULOCYTES
Immature Retic Fract: 10 % (ref 2.3–15.9)
RBC.: 4.66 MIL/uL (ref 3.87–5.11)
Retic Count, Absolute: 115.6 10*3/uL (ref 19.0–186.0)
Retic Ct Pct: 2.5 % (ref 0.4–3.1)

## 2019-12-10 NOTE — Progress Notes (Signed)
Hematology and Oncology Follow Up Visit  Erin Fields [id number] 05/02/1965 54 y.o. 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 She tolerated IV iron nicely and is feeling much better.  She is currently on Prednisone for an UC flare and notes some bruising on her arms. Hgb is stable at 13.5 and platelet count 310.  She has occasional bright red blood in her stools but has not noted any other blood loss.  No fever, chills, n/v, cough, rash, dizziness, SOB, chest pain, palpitations, abdominal pain or changes in bowel or bladder habits.  No swelling, tenderness, numbness or tingling in her extremities.  No falls or syncope.  She has maintained a good appetite and is staying well hydrated.   ECOG Performance Status: 1 - Symptomatic but completely ambulatory  Medications:  Allergies as of 12/10/2019      Reactions   Losartan Potassium Shortness Of Breath   Ace Inhibitors Cough   Aspirin Other (See Comments)   Upset stomach   Cefaclor Hives   Oxycodone Hives   Sulfa Antibiotics Hives      Medication List       Accurate as of December 10, 2019  1:53 PM. If you have any questions, ask your nurse or doctor.        acyclovir 400 MG tablet Commonly known as: ZOVIRAX TAKE 1 TABLET (400 MG TOTAL) BY MOUTH 3 (THREE) TIMES DAILY AS NEEDED.   azelastine 0.1 % nasal spray Commonly known as: ASTELIN 2 sprays 2 (two) times daily.   budesonide 3 MG 24 hr capsule Commonly known as: ENTOCORT EC Take 9 mg by mouth every evening.   budesonide-formoterol 160-4.5 MCG/ACT inhaler Commonly known as: SYMBICORT Inhale 2 puffs into the lungs 2 (two) times daily.   EPINEPHrine 0.3 mg/0.3 mL Soaj injection Commonly known as: EPI-PEN epinephrine 0.3 mg/0.3 mL injection, auto-injector  TK UTD PRN   exemestane 25 MG tablet Commonly  known as: AROMASIN TAKE 1 TABLET (25 MG TOTAL) BY MOUTH DAILY AFTER BREAKFAST.   Humira Pen 40 MG/0.4ML Pnkt Generic drug: Adalimumab Inject into the skin.   Hyoscyamine Sulfate SL 0.125 MG Subl hyoscyamine 0.125 mg sublingual tablet  TAKE 1 TABLET (0.125 MG TOTAL) BY MOUTH EVERY 6 HOURS AS NEEDED FOR UP TO 30 DAYS FOR CRAMPING.   lansoprazole 30 MG capsule Commonly known as: PREVACID Take 30 mg by mouth daily at 12 noon.   levothyroxine 88 MCG tablet Commonly known as: SYNTHROID Take 1 tablet (88 mcg total) by mouth daily before breakfast.   Lialda 1.2 g EC tablet Generic drug: mesalamine Take 2.4 g by mouth in the morning and at bedtime.   metFORMIN 500 MG tablet Commonly known as: GLUCOPHAGE Take 1 tablet (500 mg total) by mouth 2 (two) times daily with a meal.   montelukast 10 MG tablet Commonly known as: SINGULAIR Take 10 mg by mouth at bedtime.   Nasacort Allergy 24HR 55 MCG/ACT Aero nasal inhaler Generic drug: triamcinolone Place 1 spray into the nose daily.   olmesartan 40 MG tablet Commonly known as: BENICAR Take 1 tablet (40 mg total) by mouth daily.   promethazine 25 MG tablet Commonly known as: PHENERGAN TAKE 1/2-1 TABLET DAILY AS NEEDED FOR NAUSEA   rosuvastatin 20 MG tablet Commonly known as: CRESTOR Take 1 tablet (20 mg total) by  mouth daily. What changed: when to take this   UNABLE TO FIND mecalamine support bid   venlafaxine XR 150 MG 24 hr capsule Commonly known as: EFFEXOR-XR Take 1 capsule (150 mg total) by mouth daily with breakfast.   Xopenex HFA 45 MCG/ACT inhaler Generic drug: levalbuterol Inhale 2 puffs into the lungs every 4 (four) hours as needed for wheezing (Q4-6 Hours PRN).   zolpidem 5 MG tablet Commonly known as: AMBIEN TAKE 1 TABLET BY MOUTH EVERY DAY AT BEDTIME AS NEEDED FOR SLEEP       Allergies:  Allergies  Allergen Reactions  . Losartan Potassium Shortness Of Breath  . Ace Inhibitors Cough  . Aspirin Other  (See Comments)    Upset stomach  . Cefaclor Hives  . Oxycodone Hives  . Sulfa Antibiotics Hives    Past Medical History, Surgical history, Social history, and Family History were reviewed and updated.  Review of Systems: All other 10 point review of systems is negative.   Physical Exam:  vitals were not taken for this visit.   Wt Readings from Last 3 Encounters:  11/06/19 169 lb 6.4 oz (76.8 kg)  10/08/19 168 lb 12.8 oz (76.6 kg)  09/19/19 171 lb 3.2 oz (77.7 kg)    Ocular: Sclerae unicteric, pupils equal, round and reactive to light Ear-nose-throat: Oropharynx clear, dentition fair Lymphatic: No cervical or supraclavicular adenopathy Lungs no rales or rhonchi, good excursion bilaterally Heart regular rate and rhythm, no murmur appreciated Abd soft, nontender, positive bowel sounds, no liver or spleen tip palpated on exam, no fluid wave  MSK no focal spinal tenderness, no joint edema Neuro: non-focal, well-oriented, appropriate affect Breasts: Deferred   Lab Results  Component Value Date   WBC 15.1 (H) 12/10/2019   HGB 13.5 12/10/2019   HCT 42.0 12/10/2019   MCV 89.0 12/10/2019   PLT 310 12/10/2019   Lab Results  Component Value Date   FERRITIN 7.0 (L) 10/08/2019   Lab Results  Component Value Date   RETICCTPCT 2.5 12/10/2019   RBC 4.66 12/10/2019   No results found for: Nils Pyle, [facility] Lab Results  Component Value Date   IGGSERUM 482 (L) 06/18/2019   No results found for: Kathrynn Ducking, MSPIKE, SPEI   Chemistry      Component Value Date/Time   NA 133 (L) 12/10/2019 1319   NA 141 03/28/2017 0823   K 4.6 12/10/2019 1319   K 3.7 03/28/2017 0823   CL 98 12/10/2019 1319   CO2 25 12/10/2019 1319   CO2 23 03/28/2017 0823   BUN 10 12/10/2019 1319   BUN 7.0 03/28/2017 0823   CREATININE 0.86 12/10/2019 1319   CREATININE 0.8 03/28/2017 0823      Component Value Date/Time   CALCIUM 9.7  12/10/2019 1319   CALCIUM 9.5 03/28/2017 0823   ALKPHOS 61 12/10/2019 1319   ALKPHOS 64 03/28/2017 0823   AST 14 (L) 12/10/2019 1319   AST 25 03/28/2017 0823   ALT 21 12/10/2019 1319   ALT 24 03/28/2017 0823   BILITOT 1.0 12/10/2019 1319   BILITOT 0.65 03/28/2017 0823       Impression and Plan: Erin Fields is a very pleasant 54 yo caucasian female with iron deficiency anemia secondary to GI blood loss with ulcerative colitis flares. She did well with IV iron and can tell that her energy is improving.  We will see what her lab work looks like and replace again if needed.  We will  plan to see her again in another 3 months.  She can contact our office with any questions or concerns. We can certainly see her sooner if needed.   Laverna Peace, NP 8/25/20211:53 PM

## 2019-12-11 ENCOUNTER — Encounter: Payer: Self-pay | Admitting: Family Medicine

## 2019-12-11 ENCOUNTER — Telehealth (INDEPENDENT_AMBULATORY_CARE_PROVIDER_SITE_OTHER): Payer: No Typology Code available for payment source | Admitting: Family Medicine

## 2019-12-11 DIAGNOSIS — J324 Chronic pansinusitis: Secondary | ICD-10-CM | POA: Diagnosis not present

## 2019-12-11 LAB — ERYTHROPOIETIN: Erythropoietin: 5.6 m[IU]/mL (ref 2.6–18.5)

## 2019-12-11 LAB — FERRITIN: Ferritin: 300 ng/mL (ref 11–307)

## 2019-12-11 LAB — IRON AND TIBC
Iron: 96 ug/dL (ref 41–142)
Saturation Ratios: 32 % (ref 21–57)
TIBC: 303 ug/dL (ref 236–444)
UIBC: 206 ug/dL (ref 120–384)

## 2019-12-11 LAB — LACTATE DEHYDROGENASE: LDH: 197 U/L — ABNORMAL HIGH (ref 98–192)

## 2019-12-11 MED ORDER — AMOXICILLIN-POT CLAVULANATE 875-125 MG PO TABS: 1.0000 | ORAL_TABLET | Freq: Two times a day (BID) | ORAL | 0 refills | Status: DC

## 2019-12-11 NOTE — Progress Notes (Signed)
Virtual Visit via Video Note  I connected with Erin Fields on 12/11/19 at 10:40 AM EDT by a video enabled telemedicine application and verified that I am speaking with the correct person using two identifiers.  Location: Patient: home alone  Provider: office    I discussed the limitations of evaluation and management by telemedicine and the availability of in person appointments. The patient expressed understanding and agreed to proceed.  History of Present Illness:  Pt is home c/o congestion x several weeks.  + cough     Normally her allergies are bad now   Observations/Objective: There were no vitals filed for this visit.  Pt is in NAD   Assessment and Plan: 1. Pansinusitis, unspecified chronicity abx per orders  con't nasacort and antihistamine Call prn  Pt instructed to get covid test as well  - amoxicillin-clavulanate (AUGMENTIN) 875-125 MG tablet; Take 1 tablet by mouth 2 (two) times daily.  Dispense: 20 tablet; Refill: 0  Follow Up Instructions:    I discussed the assessment and treatment plan with the patient. The patient was provided an opportunity to ask questions and all were answered. The patient agreed with the plan and demonstrated an understanding of the instructions.   The patient was advised to call back or seek an in-person evaluation if the symptoms worsen or if the condition fails to improve as anticipated.  I provided 25 minutes of non-face-to-face time during this encounter.   Ann Held, DO

## 2019-12-12 ENCOUNTER — Other Ambulatory Visit: Payer: No Typology Code available for payment source

## 2019-12-12 ENCOUNTER — Other Ambulatory Visit: Payer: Self-pay

## 2019-12-12 DIAGNOSIS — Z20822 Contact with and (suspected) exposure to covid-19: Secondary | ICD-10-CM

## 2019-12-14 LAB — NOVEL CORONAVIRUS, NAA: SARS-CoV-2, NAA: NOT DETECTED

## 2019-12-14 LAB — SARS-COV-2, NAA 2 DAY TAT

## 2019-12-15 ENCOUNTER — Other Ambulatory Visit: Payer: Self-pay | Admitting: Family Medicine

## 2019-12-15 DIAGNOSIS — F5104 Psychophysiologic insomnia: Secondary | ICD-10-CM

## 2020-01-05 ENCOUNTER — Encounter: Payer: Self-pay | Admitting: Family Medicine

## 2020-01-16 ENCOUNTER — Other Ambulatory Visit: Payer: Self-pay

## 2020-01-16 ENCOUNTER — Ambulatory Visit (INDEPENDENT_AMBULATORY_CARE_PROVIDER_SITE_OTHER): Payer: No Typology Code available for payment source

## 2020-01-16 DIAGNOSIS — Z23 Encounter for immunization: Secondary | ICD-10-CM

## 2020-01-21 ENCOUNTER — Encounter: Payer: Self-pay | Admitting: Family Medicine

## 2020-01-21 DIAGNOSIS — E538 Deficiency of other specified B group vitamins: Secondary | ICD-10-CM

## 2020-01-22 ENCOUNTER — Telehealth: Payer: Self-pay | Admitting: Family

## 2020-01-22 NOTE — Telephone Encounter (Signed)
Letter & Calendar mailed to patient due to provider schedule change for 11/29/ Appointments have been rescheduled due to Provider PAL

## 2020-01-26 ENCOUNTER — Other Ambulatory Visit: Payer: Self-pay

## 2020-01-26 ENCOUNTER — Encounter: Payer: Self-pay | Admitting: Family Medicine

## 2020-01-26 ENCOUNTER — Telehealth (INDEPENDENT_AMBULATORY_CARE_PROVIDER_SITE_OTHER): Payer: No Typology Code available for payment source | Admitting: Family Medicine

## 2020-01-26 DIAGNOSIS — J40 Bronchitis, not specified as acute or chronic: Secondary | ICD-10-CM

## 2020-01-26 MED ORDER — PREDNISONE 20 MG PO TABS: 40.0000 mg | ORAL_TABLET | Freq: Every day | ORAL | 0 refills | Status: DC

## 2020-01-26 NOTE — Progress Notes (Signed)
Chief Complaint  Patient presents with  . Cough    [facility] here for URI complaints. Due to COVID-19 pandemic, we are interacting via web portal for an electronic face-to-face visit. I verified patient's ID using 2 identifiers. Patient agreed to proceed with visit via this method. Patient is at home, I am at office. Patient and I are present for visit.   Duration: 1 week  Associated symptoms: ear pain, sore throat, wheezing, shortness of breath, chest tightness and cough Denies: sinus congestion, sinus pain, rhinorrhea, itchy watery eyes, ear drainage, myalgia and fevers Treatment to date: Mucinex, SABA Sick contacts: No  Past Medical History:  Diagnosis Date  . Anxiety   . Asthma   . Cancer [facility]) 2019   right breast cancer  . Colon polyps   . Depression   . Diabetes mellitus without complication ([place])    type 2  . Environmental allergies   . GERD (gastroesophageal reflux disease)   . Hyperlipidemia   . Hypertension   . Hypothyroidism   . Migraines   . Personal history of radiation therapy 2019   36 radiation tx  . PONV (postoperative nausea and vomiting)    scopolamine patch helps  . Ulcerative colitis ([place])   . Varicose veins    Exam No conversational dyspnea Age appropriate judgment and insight Nml affect and mood  Wheezy bronchitis - Plan: predniSONE (DELTASONE) 20 MG tablet  Continue to push fluids, practice good hand hygiene, cover mouth when coughing. Send message in 2 d if no better. F/u prn. If starting to experience fevers, shaking, or shortness of breath, seek immediate care. Pt voiced understanding and agreement to the plan.  [place], DO 01/26/20 9:14 AM

## 2020-02-04 ENCOUNTER — Other Ambulatory Visit (INDEPENDENT_AMBULATORY_CARE_PROVIDER_SITE_OTHER): Payer: No Typology Code available for payment source

## 2020-02-04 ENCOUNTER — Other Ambulatory Visit: Payer: Self-pay

## 2020-02-04 DIAGNOSIS — E538 Deficiency of other specified B group vitamins: Secondary | ICD-10-CM

## 2020-02-05 ENCOUNTER — Encounter: Payer: Self-pay | Admitting: Family Medicine

## 2020-02-05 LAB — VITAMIN B12: Vitamin B-12: 272 pg/mL (ref 200–1100)

## 2020-02-09 ENCOUNTER — Other Ambulatory Visit: Payer: Self-pay | Admitting: Family Medicine

## 2020-02-09 DIAGNOSIS — I10 Essential (primary) hypertension: Secondary | ICD-10-CM

## 2020-02-10 NOTE — Progress Notes (Signed)
[place]   [phone] [phone]   Clinic Follow up Note   Patient Care Team: Copland, Gay Filler, MD as PCP - General (Family Medicine) [name], MD as Referring Physician (Obstetrics and Gynecology) [name], MD as Consulting Physician (Gastroenterology) [name], MD as Referring Physician (Allergy) [name], MD as Consulting Physician (General Surgery) [name], MD as Consulting Physician (Radiation Oncology) [name], MD as Consulting Physician (Hematology) Delice Bison, Charlestine Massed, NP as Nurse Practitioner (Hematology and Oncology) [name], MD as Consulting Physician (Gynecologic Oncology) 02/11/2020  CHIEF COMPLAINT: Follow-up right breast cancer, IDA  SUMMARY OF ONCOLOGIC HISTORY: Oncology History Overview Note  Cancer Staging Malignant neoplasm of upper-outer quadrant of right breast in female, estrogen receptor positive ([place]) Staging form: Breast, AJCC 8th Edition - Clinical stage from 03/16/2017: Stage IA (cT1b, cN0, cM0, G2, ER: Positive, PR: Positive, HER2: Negative) - Signed by [name], MD on 03/28/2017 - Pathologic stage from 05/15/2017: Stage IA (pT2, pN0, cM0, G2, ER+, PR+, HER2-, Oncotype DX score: 3) - Signed by [name], MD on 08/06/2017     Malignant neoplasm of upper-outer quadrant of right breast in female, estrogen receptor positive ([place])  03/15/2017 Mammogram   Diagnostic Mammogram Right Breast  IMPRESSION: Palpable abnormality corresponds to distortion and focal mass associated acoustic shadowing measuring 0.8x0.9x0.9cm in the 930 o'clock location of the right breast 7 cm from the nipple.   RECOMMENDATION: Ultrasound-guided core biopsy is recommended and scheduled for the patient.   03/16/2017 Initial Biopsy   Biopsy Results  Diagnosis 03/16/17 Breast, right, needle core biopsy, 9:30 o'clock, ribbon clip placed - INVASIVE MAMMARY CARCINOMA. - SEE COMMENT.      03/16/2017 Receptors her2   Estrogen Receptor: 95%, POSITIVE, STRONG STAINING INTENSITY Progesterone Receptor: 95%, POSITIVE, STRONG STAINING INTENSITY Proliferation Marker Ki67: 3% HER2: NEGATIVE   03/20/2017 Initial Diagnosis   Malignant neoplasm of upper-outer quadrant of right breast in female, estrogen receptor positive ([place])   04/24/2017 Surgery   Right Breast Lumpectomy with Sentinel Node Biopsy with Dr. [name]    04/24/2017 Pathology Results   Diagnosis 04/24/17 1. Breast, lumpectomy, Right w/seed - INVASIVE LOBULAR CARCINOMA, GRADE 2, SPANNING 2.2 CM. - LOBULAR NEOPLASIA (ATYPICAL LOBULAR HYPERPLASIA). - ANTERIOR SUPERIOR MARGIN IS BROADLY POSITIVE. - BIOPSY SITE. - SEE ONCOLOGY TABLE. 2. Breast, excision, Right additional Medial Margin - INVASIVE LOBULAR CARCINOMA. - LOBULAR CARCINOMA IN SITU. - INVASIVE CARCINOMA IS 0.3 CM FROM NEW MEDIAL MARGIN. 3. Lymph node, sentinel, biopsy, Right Axillary #1 - ISOLATED TUMOR CELLS IN ONE OF ONE LYMPH NODES (0/1). 4. Lymph node, sentinel, biopsy, Right Axillary #2 - ONE OF ONE LYMPH NODES NEGATIVE FOR CARCINOMA (0/1). 5. Lymph node, sentinel, biopsy, Right Axillary #3 - ONE OF ONE LYMPH NODES NEGATIVE FOR CARCINOMA (0/1).   04/24/2017 Oncotype testing   Her Oncotyple recurrence score was 3, her distant recurrence score with Tamoxifen was 3% and her adjuvant chemotherapy benefit was <1%.    05/15/2017 Surgery   Re-excision of Right Breast Lumpectomy with Dr. [name]    05/15/2017 Pathology Results   Diagnosis 05/15/17 Breast, excision, Right additional anterior margin - INVASIVE LOBULAR CARCINOMA. - ATYPICAL LOBULAR HYPERPLASIA. - THE NEW ANTERIOR SURGICAL RESECTION MARGIN IS NEGATIVE FOR CARCINOMA.   05/15/2017 Cancer Staging   Staging form: Breast, AJCC 8th Edition - Pathologic stage from 05/15/2017: Stage IA (pT2, pN0, cM0, G2, ER+, PR+, HER2-, Oncotype DX score: 3) - Signed by [name], MD on 08/06/2017   06/26/2017 -  Radiation  Therapy   Adjuvant RT with Dr. [name]   07/12/2017 Genetic Testing   Negative genetic testing on the multi-cancer panel.  The Multi-Gene Panel offered by [name] includes sequencing and/or deletion duplication testing of the following 83 genes: ALK, APC, ATM, AXIN2,BAP1,  BARD1, BLM, BMPR1A, BRCA1, BRCA2, BRIP1, CASR, CDC73, CDH1, CDK4, CDKN1B, CDKN1C, CDKN2A (p14ARF), CDKN2A (p16INK4a), CEBPA, CHEK2, CTNNA1, DICER1, DIS3L2, EGFR (c.2369C>T, p.Thr790Met variant only), EPCAM (Deletion/duplication testing only), FH, FLCN, GATA2, GPC3, GREM1 (Promoter region deletion/duplication testing only), HOXB13 (c.251G>A, p.Gly84Glu), HRAS, KIT, MAX, MEN1, MET, MITF (c.952G>A, p.Glu318Lys variant only), MLH1, MSH2, MSH3, MSH6, MUTYH, NBN, NF1, NF2, NTHL1, PALB2, PDGFRA, PHOX2B, PMS2, POLD1, POLE, POT1, PRKAR1A, PTCH1, PTEN, RAD50, RAD51C, RAD51D, RB1, RECQL4, RET, RUNX1, SDHAF2, SDHA (sequence changes only), SDHB, SDHC, SDHD, SMAD4, SMARCA4, SMARCB1, SMARCE1, STK11, SUFU, TERT, TERT, TMEM127, TP53, TSC1, TSC2, VHL, WRN and WT1.  The report date is July 12, 2017.    08/13/2017 Imaging   08/13/2017 DEXA ASSESSMENT: The BMD measured at AP Spine L1-L4 is 1.120 Erin/cm2 with a T-score of -0.5. This patient is considered normal according to [facility]) [facility].    08/2017 -  Anti-estrogen oral therapy   Letrozole daily, changed to Exemestane due to poor tolerance of Letrozole   01/16/2018 Imaging   01/16/2018 CT Chest IMPRESSION: Stable 2 mm RIGHT upper lobe nodule.  Post radiation therapy changes at the anterolateral RIGHT upper lobe.  Post RIGHT breast surgery and RIGHT axillary node dissection.  No new abnormalities.  Aortic Atherosclerosis (ICD10-I70.0).   08/09/2018 Mammogram   IMPRESSION: 1. No mammographic evidence of malignancy involving either breast. 2. Expected post lumpectomy and post radiation changes involving the RIGHT breast.     CURRENT THERAPY:  1. Adjuvant  exemestane, started 09/2017 2. IV venofer PRN, last given 7/22-11/21/2019 in 5 doses  INTERVAL HISTORY: Erin Fields returns for follow-up as scheduled.  She was last seen by Dr. Burr Medico for routine breast cancer surveillance in 03/2019 and by Laverna Peace NP for [place] in 11/2019 with IV iron. She continues exemestane. Her hot flashes are increasing over time, worse at night and "terrible" with drenching each night. She had a partial hysterectomy with removal of ovaries and tubes on 08/2019. She is gradually recovering from a UC flare, had to go off Humira for her hip surgery. On Lomotil for a month now. She needs a left hip replacement, otherwise no new bone pain. Denies any new breast mass, nipple discharge or inversion. Denies any fever, chills, cough, chest pain, dyspnea, abdominal pain.   MEDICAL HISTORY:  Past Medical History:  Diagnosis Date  . Anxiety   . Asthma   . Cancer [facility]) 2019   right breast cancer  . Colon polyps   . Depression   . Diabetes mellitus without complication ([place])    type 2  . Environmental allergies   . GERD (gastroesophageal reflux disease)   . Hyperlipidemia   . Hypertension   . Hypothyroidism   . Migraines   . Personal history of radiation therapy 2019   36 radiation tx  . PONV (postoperative nausea and vomiting)    scopolamine patch helps  . Ulcerative colitis ([place])   . Varicose veins     SURGICAL HISTORY: Past Surgical History:  Procedure Laterality Date  . BREAST BIOPSY Right 02/2017  . BREAST LUMPECTOMY WITH RADIOACTIVE SEED AND SENTINEL LYMPH NODE BIOPSY Right 04/24/2017   Procedure: RIGHT BREAST LUMPECTOMY WITH RADIOACTIVE SEED AND SENTINEL LYMPH NODE BIOPSY;  Surgeon: [name],  Dorris Fetch, MD;  Location: [place];  Service: General;  Laterality: Right;  . crapal tunnel relase Left 05/2018  . ENDOMETRIAL ABLATION  2010   Had abnormal uterine bleeding (heavy and frequent), performed in [place], menses stopped completely 2 years after  .  EXPLORATORY LAPAROTOMY     Had exploratory surgery at the age of 64 for abdominal pain with cyst found on her left ovary, not treated surgically  . FOOT SURGERY Right 2012  . HAND SURGERY Right   . RE-EXCISION OF BREAST LUMPECTOMY Right 05/15/2017   Procedure: RE-EXCISION OF BREAST LUMPECTOMY;  Surgeon: [name], MD;  Location: [place];  Service: General;  Laterality: Right;  . ROBOTIC ASSISTED SALPINGO OOPHERECTOMY Bilateral 08/29/2019   Procedure: XI ROBOTIC ASSISTED SALPINGO OOPHORECTOMY;  Surgeon: [name], MD;  [facility];  Service: Gynecology;  Laterality: Bilateral;  . Septoplasty with turbinate reduction    . TONSILLECTOMY  1980  . VEIN SURGERY     Removal Right Leg  . WISDOM TOOTH EXTRACTION      I have reviewed the social history and family history with the patient and they are unchanged from previous note.  ALLERGIES:  is allergic to losartan potassium, ace inhibitors, aspirin, cefaclor, oxycodone, and sulfa antibiotics.  MEDICATIONS:  Current Outpatient Medications  Medication Sig Dispense Refill  . acyclovir (ZOVIRAX) 400 MG tablet TAKE 1 TABLET (400 MG TOTAL) BY MOUTH 3 (THREE) TIMES DAILY AS NEEDED. 270 tablet 1  . Adalimumab 80 MG/0.8ML PNKT Inject into the skin.    Marland Kitchen azelastine (ASTELIN) 0.1 % nasal spray 2 sprays 2 (two) times daily.  6  . budesonide-formoterol (SYMBICORT) 160-4.5 MCG/ACT inhaler Inhale 2 puffs into the lungs 2 (two) times daily.    Marland Kitchen EPINEPHrine 0.3 mg/0.3 mL IJ SOAJ injection epinephrine 0.3 mg/0.3 mL injection, auto-injector  TK UTD PRN    . exemestane (AROMASIN) 25 MG tablet TAKE 1 TABLET (25 MG TOTAL) BY MOUTH DAILY AFTER BREAKFAST. 90 tablet 1  . Hyoscyamine Sulfate SL 0.125 MG SUBL hyoscyamine 0.125 mg sublingual tablet  TAKE 1 TABLET (0.125 MG TOTAL) BY MOUTH EVERY 6 HOURS AS NEEDED FOR UP TO 30 DAYS FOR CRAMPING.    . lansoprazole (PREVACID) 30 MG capsule Take 30 mg by mouth daily at  12 noon.    . levalbuterol (XOPENEX HFA) 45 MCG/ACT inhaler Inhale 2 puffs into the lungs every 4 (four) hours as needed for wheezing (Q4-6 Hours PRN).    Marland Kitchen levothyroxine (SYNTHROID) 88 MCG tablet Take 1 tablet (88 mcg total) by mouth daily before breakfast. 90 tablet 1  . mesalamine (LIALDA) 1.2 Erin EC tablet Take 2.4 Erin by mouth in the morning and at bedtime.     . metFORMIN (GLUCOPHAGE) 500 MG tablet Take 1 tablet (500 mg total) by mouth 2 (two) times daily with a meal. 180 tablet 3  . montelukast (SINGULAIR) 10 MG tablet Take 10 mg by mouth at bedtime.     Marland Kitchen olmesartan (BENICAR) 40 MG tablet TAKE 1 TABLET BY MOUTH EVERY DAY 90 tablet 1  . promethazine (PHENERGAN) 25 MG tablet TAKE 1/2-1 TABLET DAILY AS NEEDED FOR NAUSEA 30 tablet 5  . rosuvastatin (CRESTOR) 20 MG tablet Take 1 tablet (20 mg total) by mouth daily. (Patient taking differently: Take 20 mg by mouth at bedtime. ) 90 tablet 3  . triamcinolone (NASACORT ALLERGY 24HR) 55 MCG/ACT AERO nasal inhaler Place 1 spray into the nose daily.    Marland Kitchen  venlafaxine XR (EFFEXOR-XR) 150 MG 24 hr capsule Take 1 capsule (150 mg total) by mouth daily with breakfast. 90 capsule 1  . zolpidem (AMBIEN) 5 MG tablet TAKE 1 TABLET BY MOUTH AT BEDTIME AS NEEDED FOR SLEEP 30 tablet 2   No current facility-administered medications for this visit.    PHYSICAL EXAMINATION: ECOG PERFORMANCE STATUS: 1 - Symptomatic but completely ambulatory  Vitals:   02/11/20 1147  BP: (!) 142/83  Pulse: (!) 103  Resp: 18  Temp: 98.1 F (36.7 C)  SpO2: 97%   Filed Weights   02/11/20 1147  Weight: 171 lb 12.8 oz (77.9 kg)    GENERAL:alert, no distress and comfortable SKIN: No rash EYES:  sclera clear NECK: Without mass LYMPH:  no palpable cervical or supraclavicular lymphadenopathy  LUNGS: clear with normal breathing effort HEART: regular rate & rhythm, no lower extremity edema ABDOMEN:abdomen soft, non-tender and normal bowel sounds Musculoskeletal: No focal  tenderness NEURO: alert & oriented x 3 with fluent speech Breast exam: S/p right lumpectomy, inverted nipple without discharge. Surgical incisions completely healed. No palpable mass in either breast or axilla that I could appreciate.  LABORATORY DATA:  I have reviewed the data as listed CBC Latest Ref Rng & Units 02/11/2020 12/10/2019 11/06/2019  WBC 4.0 - 10.5 K/uL 11.7(H) 15.1(H) 9.7  Hemoglobin 12.0 - 15.0 Erin/dL 14.0 13.5 12.3  Hematocrit 36 - 46 % 41.5 42.0 39.4  Platelets 150 - 400 K/uL 357 310 456(H)     CMP Latest Ref Rng & Units 02/11/2020 12/10/2019 11/06/2019  Glucose 70 - 99 mg/dL 206(H) 320(H) 134(H)  BUN 6 - 20 mg/dL 6 10 6   Creatinine 0.44 - 1.00 mg/dL 0.74 0.86 0.67  Sodium 135 - 145 mmol/L 136 133(L) 138  Potassium 3.5 - 5.1 mmol/L 4.2 4.6 3.8  Chloride 98 - 111 mmol/L 100 98 101  CO2 22 - 32 mmol/L 29 25 29   Calcium 8.9 - 10.3 mg/dL 9.7 9.7 9.9  Total Protein 6.5 - 8.1 Erin/dL 6.4(L) 6.4(L) 6.7  Total Bilirubin 0.3 - 1.2 mg/dL 0.8 1.0 0.7  Alkaline Phos 38 - 126 U/L 61 61 57  AST 15 - 41 U/L 18 14(L) 10(L)  ALT 0 - 44 U/L 28 21 12       RADIOGRAPHIC STUDIES: I have personally reviewed the radiological images as listed and agreed with the findings in the report. No results found.   ASSESSMENT & PLAN: Erin Fields a 54 y.o.femalewith history of  1. Malignant neoplasm of upper-outer quadrant of right breast, invasive lobular carcinoma, stage IA, pT2N0M0, ER/PR: Positive, HER2 negative, Grade II -She was initially diagnosed on 03/16/2017, s/p right breast lumpectomy and re-excision for positive margin per Dr. [name] and adjuvant RT by Dr. [name] 06/26/2017-08/09/2017 -Due to low Oncotype recurrence score 3, adjuvant chemo was not recommended. - She started adjuvent anti-estrogen therapy with letrozole on 08/2017 but switched to Exemestane due to poor tolerance. Plan to continuefor7-10yearsdue to the lobular histology -11/03/2019 mammoshowed indeterminate new 1.1  cm group of calcifications involving the upper inner quadrant of right breast as well as expected postlumpectomy changes in the upper outer quadrant, no mammographic evidence of malignancy on the left. Biopsy showed fibroadenoma with calcifications, no malignancy. -On 49-monthmammogram recall for surveillance and calcifications    2. Iron deficiency anemia secondary to ulcerative colitis, normal B12 -[facility] -Ferritin 7 on 10/08/2019 but no significant anemia -S/p Venofer 200 mg x 5 from  7/22-11/21/19 -Ferritin normalized to 300 on 12/10/2019 -Level is pending from today  3. Genetic Testing - negative  4. Anxiety  -stable, on effexor-XR for mood which is stable and ambien for sleep. -F/u with PCP Dr. Edilia Bo   5. Bone Health  -DEXA in 07/2017 and 10/2019 were normal -continue calcium and vitamin D and weight bearing exercises  6. Hypothyroidism, DM, UC, Asthma -F/u PCP, pulm, and GI -On Entocort, Lialda, Lomotil, and Humira for UC  Disposition Ms. Sulkowski is clinically doing well. She continues exemestane which she tolerates well except increasing and severe hot flashes. She is already on Effexor, I recommend to add low-dose gabapentin at night to see if this helps. We reviewed potential side effects and a prescription was sent to her pharmacy. Breast exam is benign, labs unremarkable. Overall no clinical concern for breast cancer recurrence. She'll continue AI and surveillance with a 58-monthrecall mammogram/US in 04/2020.  We reviewed the CBC and CMP from today. She has mild leukocytosis which is likely related to UC. She is not anemic. Iron panel is completely normal.  She responded well to IV iron.  I recommend to repeat CBC and iron studies in 3 and 6 months.  We will arrange additional IV iron at that point if needed.  We discussed consolidating her care, I will send a message to SLaverna Peace NP to let her know we're happy to follow  her anemia as well and that patient is agreeable if NP agrees.  We will see her back in 6 months for routine breast cancer surveillance and anemia.   Orders Placed This Encounter  Procedures  . MM DIAG BREAST TOMO UNI RIGHT    Standing Status:   Future    Standing Expiration Date:   02/10/2021    Order Specific Question:   Reason for Exam (SYMPTOM  OR DIAGNOSIS REQUIRED)    Answer:   h/o right breast cancer, f/u calcifications    Order Specific Question:   Is the patient pregnant?    Answer:   No    Order Specific Question:   [facility]    [facility] [place] [facility]TE UNI RIGHT INC AXILLA    Standing Status:   Future    Standing Expiration Date:   02/10/2021    Order Specific Question:   Reason for Exam (SYMPTOM  OR DIAGNOSIS REQUIRED)    Answer:   h/o right breast cancer, f/u calcifications    Order Specific Question:   [facility]    [facility] Reticulocytes    Standing Status:   Future    Standing Expiration Date:   02/10/2021  . Iron and TIBC    Standing Status:   Future    Standing Expiration Date:   02/10/2021  . Ferritin    Standing Status:   Future    Standing Expiration Date:   02/10/2021  . Vitamin B12    Standing Status:   Future    Standing Expiration Date:   02/10/2021  . Iron and TIBC    Standing Status:   Future    Standing Expiration Date:   02/10/2021  . Ferritin    Standing Status:   Future    Standing Expiration Date:   02/10/2021   All questions were answered. The patient knows to call the clinic with any problems, questions or concerns. No barriers to learning were detected.  Total encounter time was 30 minutes.  Alla Feeling, NP 02/11/20

## 2020-02-11 ENCOUNTER — Other Ambulatory Visit: Payer: Self-pay

## 2020-02-11 ENCOUNTER — Telehealth: Payer: Self-pay | Admitting: Nurse Practitioner

## 2020-02-11 ENCOUNTER — Inpatient Hospital Stay: Payer: No Typology Code available for payment source

## 2020-02-11 ENCOUNTER — Inpatient Hospital Stay: Payer: No Typology Code available for payment source | Attending: Hematology | Admitting: Nurse Practitioner

## 2020-02-11 ENCOUNTER — Encounter: Payer: Self-pay | Admitting: Nurse Practitioner

## 2020-02-11 VITALS — BP 142/83 | HR 103 | Temp 98.1°F | Resp 18 | Ht 63.0 in | Wt 171.8 lb

## 2020-02-11 DIAGNOSIS — Z170 Estrogen receptor positive status [ER+]: Secondary | ICD-10-CM

## 2020-02-11 DIAGNOSIS — K5190 Ulcerative colitis, unspecified, without complications: Secondary | ICD-10-CM | POA: Diagnosis not present

## 2020-02-11 DIAGNOSIS — Z79899 Other long term (current) drug therapy: Secondary | ICD-10-CM | POA: Insufficient documentation

## 2020-02-11 DIAGNOSIS — Z79811 Long term (current) use of aromatase inhibitors: Secondary | ICD-10-CM | POA: Insufficient documentation

## 2020-02-11 DIAGNOSIS — E119 Type 2 diabetes mellitus without complications: Secondary | ICD-10-CM | POA: Insufficient documentation

## 2020-02-11 DIAGNOSIS — D500 Iron deficiency anemia secondary to blood loss (chronic): Secondary | ICD-10-CM | POA: Diagnosis not present

## 2020-02-11 DIAGNOSIS — D508 Other iron deficiency anemias: Secondary | ICD-10-CM | POA: Diagnosis not present

## 2020-02-11 DIAGNOSIS — E039 Hypothyroidism, unspecified: Secondary | ICD-10-CM | POA: Diagnosis not present

## 2020-02-11 DIAGNOSIS — C50411 Malignant neoplasm of upper-outer quadrant of right female breast: Secondary | ICD-10-CM

## 2020-02-11 DIAGNOSIS — D72829 Elevated white blood cell count, unspecified: Secondary | ICD-10-CM | POA: Diagnosis not present

## 2020-02-11 DIAGNOSIS — Z90722 Acquired absence of ovaries, bilateral: Secondary | ICD-10-CM | POA: Insufficient documentation

## 2020-02-11 DIAGNOSIS — J45909 Unspecified asthma, uncomplicated: Secondary | ICD-10-CM | POA: Insufficient documentation

## 2020-02-11 DIAGNOSIS — N951 Menopausal and female climacteric states: Secondary | ICD-10-CM | POA: Insufficient documentation

## 2020-02-11 DIAGNOSIS — Z90710 Acquired absence of both cervix and uterus: Secondary | ICD-10-CM | POA: Insufficient documentation

## 2020-02-11 DIAGNOSIS — F419 Anxiety disorder, unspecified: Secondary | ICD-10-CM | POA: Diagnosis not present

## 2020-02-11 LAB — CBC WITH DIFFERENTIAL (CANCER CENTER ONLY)
Abs Immature Granulocytes: 0.14 10*3/uL — ABNORMAL HIGH (ref 0.00–0.07)
Basophils Absolute: 0.1 10*3/uL (ref 0.0–0.1)
Basophils Relative: 1 %
Eosinophils Absolute: 0.1 10*3/uL (ref 0.0–0.5)
Eosinophils Relative: 1 %
HCT: 41.5 % (ref 36.0–46.0)
Hemoglobin: 14 g/dL (ref 12.0–15.0)
Immature Granulocytes: 1 %
Lymphocytes Relative: 19 %
Lymphs Abs: 2.2 10*3/uL (ref 0.7–4.0)
MCH: 30.7 pg (ref 26.0–34.0)
MCHC: 33.7 g/dL (ref 30.0–36.0)
MCV: 91 fL (ref 80.0–100.0)
Monocytes Absolute: 1.2 10*3/uL — ABNORMAL HIGH (ref 0.1–1.0)
Monocytes Relative: 10 %
Neutro Abs: 7.9 10*3/uL — ABNORMAL HIGH (ref 1.7–7.7)
Neutrophils Relative %: 68 %
Platelet Count: 357 10*3/uL (ref 150–400)
RBC: 4.56 MIL/uL (ref 3.87–5.11)
RDW: 15 % (ref 11.5–15.5)
WBC Count: 11.7 10*3/uL — ABNORMAL HIGH (ref 4.0–10.5)
nRBC: 0 % (ref 0.0–0.2)

## 2020-02-11 LAB — IRON AND TIBC
Iron: 107 ug/dL (ref 41–142)
Saturation Ratios: 29 % (ref 21–57)
TIBC: 366 ug/dL (ref 236–444)
UIBC: 259 ug/dL (ref 120–384)

## 2020-02-11 LAB — CMP (CANCER CENTER ONLY)
ALT: 28 U/L (ref 0–44)
AST: 18 U/L (ref 15–41)
Albumin: 4 g/dL (ref 3.5–5.0)
Alkaline Phosphatase: 61 U/L (ref 38–126)
Anion gap: 7 (ref 5–15)
BUN: 6 mg/dL (ref 6–20)
CO2: 29 mmol/L (ref 22–32)
Calcium: 9.7 mg/dL (ref 8.9–10.3)
Chloride: 100 mmol/L (ref 98–111)
Creatinine: 0.74 mg/dL (ref 0.44–1.00)
GFR, Estimated: >60 mL/min (ref >60)
Glucose, Bld: 206 mg/dL — ABNORMAL HIGH (ref 70–99)
Potassium: 4.2 mmol/L (ref 3.5–5.1)
Sodium: 136 mmol/L (ref 135–145)
Total Bilirubin: 0.8 mg/dL (ref 0.3–1.2)
Total Protein: 6.4 g/dL — ABNORMAL LOW (ref 6.5–8.1)

## 2020-02-11 LAB — RETICULOCYTES
Immature Retic Fract: 3.5 % (ref 2.3–15.9)
RBC.: 4.55 MIL/uL (ref 3.87–5.11)
Retic Count, Absolute: 87.8 10*3/uL (ref 19.0–186.0)
Retic Ct Pct: 1.9 % (ref 0.4–3.1)

## 2020-02-11 LAB — FERRITIN: Ferritin: 201 ng/mL (ref 11–307)

## 2020-02-11 NOTE — Telephone Encounter (Signed)
Scheduled per los. Declined printout

## 2020-02-12 MED ORDER — GABAPENTIN 100 MG PO CAPS: 100.0000 mg | ORAL_CAPSULE | Freq: Every day | ORAL | 1 refills | Status: DC

## 2020-02-12 NOTE — Addendum Note (Signed)
Addended by: Alla Feeling on: 02/12/2020 08:28 AM   Modules accepted: Orders

## 2020-03-09 ENCOUNTER — Encounter: Payer: Self-pay | Admitting: Family Medicine

## 2020-03-09 DIAGNOSIS — F5104 Psychophysiologic insomnia: Secondary | ICD-10-CM

## 2020-03-09 MED ORDER — ZOLPIDEM TARTRATE 5 MG PO TABS: 5.0000 mg | ORAL_TABLET | Freq: Every day | ORAL | 2 refills | Status: DC

## 2020-03-09 MED ORDER — ESZOPICLONE 2 MG PO TABS
1.0000 mg | ORAL_TABLET | Freq: Every evening | ORAL | 0 refills | Status: DC | PRN
Start: 2020-03-09 — End: 2020-04-01

## 2020-03-09 NOTE — Telephone Encounter (Signed)
I have updated sig to reflect her request. Please check behind me and advise refill if appropriate.   Requesting:Ambien Contract:none, needs csc HRV:ACQP, needs uds Last Visit:10/08/2019 Next Visit:none scheduled Last Refill: 12/16/2019 2 refills.  Please Advise

## 2020-03-09 NOTE — Addendum Note (Signed)
Addended by: Lamar Blinks C on: 03/09/2020 06:20 PM   Modules accepted: Orders

## 2020-03-15 ENCOUNTER — Ambulatory Visit: Payer: No Typology Code available for payment source | Admitting: Family

## 2020-03-15 ENCOUNTER — Inpatient Hospital Stay: Payer: No Typology Code available for payment source

## 2020-03-17 ENCOUNTER — Encounter: Payer: Self-pay | Admitting: Family Medicine

## 2020-03-17 ENCOUNTER — Ambulatory Visit: Payer: No Typology Code available for payment source | Admitting: Family

## 2020-03-17 ENCOUNTER — Other Ambulatory Visit: Payer: No Typology Code available for payment source

## 2020-03-22 ENCOUNTER — Encounter: Payer: Self-pay | Admitting: Family Medicine

## 2020-03-23 ENCOUNTER — Telehealth: Payer: Self-pay

## 2020-03-23 NOTE — Telephone Encounter (Signed)
I have received surgical clearance form from [facility]. Placed in providers folder for review. It does not look like she has upcoming appointment at our office.

## 2020-03-24 ENCOUNTER — Encounter: Payer: Self-pay | Admitting: Family Medicine

## 2020-04-01 ENCOUNTER — Other Ambulatory Visit: Payer: Self-pay

## 2020-04-01 ENCOUNTER — Ambulatory Visit (INDEPENDENT_AMBULATORY_CARE_PROVIDER_SITE_OTHER): Payer: No Typology Code available for payment source | Admitting: Family Medicine

## 2020-04-01 ENCOUNTER — Encounter: Payer: Self-pay | Admitting: Family Medicine

## 2020-04-01 VITALS — BP 136/84 | HR 97 | Resp 16 | Ht 63.0 in | Wt 166.0 lb

## 2020-04-01 DIAGNOSIS — R590 Localized enlarged lymph nodes: Secondary | ICD-10-CM | POA: Diagnosis not present

## 2020-04-01 DIAGNOSIS — E119 Type 2 diabetes mellitus without complications: Secondary | ICD-10-CM

## 2020-04-01 DIAGNOSIS — Z01818 Encounter for other preprocedural examination: Secondary | ICD-10-CM | POA: Diagnosis not present

## 2020-04-01 NOTE — Progress Notes (Addendum)
[facility] [address] [place], [place] [address] [phone]  Date:  04/01/2020   Name:  Shanielle Correll   DOB:  08/21/1965   MRN:  [id number]  PCP:  Darreld Mclean, MD    Chief Complaint: Surgical Clearance   History of Present Illness:  Victorian Gunn is a 54 y.o. very pleasant female patient who presents with the following:  Patient is here today for preoperative clearance-she plans done near future.  She is having severe symptoms from hip pain-difficulty walking and completing other ADLs Last seen by myself in June History of UC, breast cancer 2018, hypothyroidism, HTN, hyperlipidemia, DM, IgG deficiency followed by [facility] Her ulcerative colitis is managed by Dr.Medoff Last A1c showed reasonable control of her diabetes; will recheck today, her A1c needs to meet a certain goal for her to be eligible for orthopedic surgery  She has noted hip pain for 2 years- getting gradually worse  She is fairly limited with her physical activity now due to hip pain.  However she states that she is able to do activities such as vacuuming her home without any chest pain or shortness of breath  Lab Results  Component Value Date   HGBA1C 7.3 (H) 10/08/2019   Seen by GYN oncology in June  Wt Readings from Last 3 Encounters:  04/01/20 166 lb (75.3 kg)  02/11/20 171 lb 12.8 oz (77.9 kg)  12/10/19 170 lb 12.8 oz (77.5 kg)     Patient Active Problem List   Diagnosis Date Noted  . B12 deficiency 11/27/2019  . IDA (iron deficiency anemia) 11/06/2019  . Anxiety and depression 01/20/2018  . Right upper lobe pulmonary nodule 01/10/2018  . Nasal polyp, unspecified 01/10/2018  . Left sided abdominal pain 12/13/2017  . Impaired fasting glucose 12/12/2017  . History of adenomatous polyp of colon 11/19/2017  . Genetic testing 07/16/2017  . Family history of breast cancer   . Controlled type 2 diabetes mellitus without  complication, without long-term current use of insulin ([place]) 05/28/2017  . Malignant neoplasm of upper-outer quadrant of right breast in female, estrogen receptor positive ([place]) 03/20/2017  . Crohn disease ([place]) 11/29/2016  . Borderline diabetes 12/17/2015  . Ex-smoker 11/18/2015  . Chronic venous insufficiency 09/02/2015  . Symptomatic varicose veins, right 09/02/2015  . Eustachian tube dysfunction 12/04/2014  . Allergic rhinitis 09/15/2014  . Dyspnea 09/15/2014  . Liver hemangioma 08/24/2014  . Elevated triglycerides with high cholesterol 11/26/2013  . Insomnia 08/20/2013  . Hypertension 08/20/2013  . Ulcerative colitis ([place]) 04/20/2013  . Oral herpes 04/20/2013  . Hyperlipidemia 12/06/2006  . HEADACHE 12/06/2006  . CERVICAL CANCER, HX OF 12/06/2006  . Hypothyroidism 11/29/2006  . Asthma 11/29/2006  . IRRITABLE BOWEL SYNDROME 11/29/2006    Past Medical History:  Diagnosis Date  . Anxiety   . Asthma   . Cancer [facility]) 2019   right breast cancer  . Colon polyps   . Depression   . Diabetes mellitus without complication ([place])    type 2  . Environmental allergies   . GERD (gastroesophageal reflux disease)   . Hyperlipidemia   . Hypertension   . Hypothyroidism   . Migraines   . Personal history of radiation therapy 2019   36 radiation tx  . PONV (postoperative nausea and vomiting)    scopolamine patch helps  . Ulcerative colitis ([place])   . Varicose veins     Past Surgical History:  Procedure Laterality Date  . BREAST BIOPSY Right 02/2017  . BREAST LUMPECTOMY WITH RADIOACTIVE SEED AND SENTINEL LYMPH NODE BIOPSY Right 04/24/2017   Procedure: RIGHT BREAST LUMPECTOMY WITH RADIOACTIVE SEED AND SENTINEL LYMPH NODE BIOPSY;  Surgeon: [name], MD;  Location: [facility];  Service: General;  Laterality: Right;  . crapal tunnel relase Left 05/2018  . ENDOMETRIAL ABLATION  2010   Had abnormal uterine bleeding (heavy and frequent), performed in [employer], menses  stopped completely 2 years after  . EXPLORATORY LAPAROTOMY     Had exploratory surgery at the age of 49 for abdominal pain with cyst found on her left ovary, not treated surgically  . FOOT SURGERY Right 2012  . HAND SURGERY Right   . RE-EXCISION OF BREAST LUMPECTOMY Right 05/15/2017   Procedure: RE-EXCISION OF BREAST LUMPECTOMY;  Surgeon: [name], MD;  Location: [facility];  Service: General;  Laterality: Right;  . ROBOTIC ASSISTED SALPINGO OOPHERECTOMY Bilateral 08/29/2019   Procedure: XI ROBOTIC ASSISTED SALPINGO OOPHORECTOMY;  Surgeon: [name], MD;  Location: [facility];  Service: Gynecology;  Laterality: Bilateral;  . Septoplasty with turbinate reduction    . TONSILLECTOMY  1980  . VEIN SURGERY     Removal Right Leg  . WISDOM TOOTH EXTRACTION      Social History   Tobacco Use  . Smoking status: Former Smoker    Packs/day: 3.00    Years: 15.00    Pack years: 45.00    Types: Cigarettes    Quit date: 09/14/1993    Years since quitting: 26.5  . Smokeless tobacco: Never Used  . Tobacco comment: Quit 26 yrs ago  Vaping Use  . Vaping Use: Never used  Substance Use Topics  . Alcohol use: Yes    Comment: Drinks a couple every 2-3 months  . Drug use: No    Family History  Problem Relation Age of Onset  . Arthritis Mother 5       Deceased  . Breast cancer Mother   . Lung cancer Mother   . Hyperlipidemia Mother   . Heart disease Mother   . Hypertension Mother   . Diabetes Mother   . Crohn's disease Mother   . Diabetes Maternal Grandfather   . Heart disease Maternal Grandfather   . Heart attack Maternal Grandfather   . Leukemia Maternal Grandfather   . Colitis Sister   . Leukemia Cousin 3       mat cousin  . Lung cancer Father   . Brain cancer Father     Allergies  Allergen Reactions  . Losartan Potassium Shortness Of Breath  . Ace Inhibitors Cough  . Aspirin Other (See Comments)    Upset stomach  . Cefaclor Hives   . Oxycodone Hives  . Sulfa Antibiotics Hives    Medication list has been reviewed and updated.  Current Outpatient Medications on File Prior to Visit  Medication Sig Dispense Refill  . acyclovir (ZOVIRAX) 400 MG tablet TAKE 1 TABLET (400 MG TOTAL) BY MOUTH 3 (THREE) TIMES DAILY AS NEEDED. 270 tablet 1  . azelastine (ASTELIN) 0.1 % nasal spray 2 sprays 2 (two) times daily.  6  . budesonide-formoterol (SYMBICORT) 160-4.5 MCG/ACT inhaler Inhale 2 puffs into the lungs 2 (two) times daily.    . diphenoxylate-atropine (LOMOTIL) 2.5-0.025 MG tablet Take by mouth 4 (four) times daily as needed for diarrhea or loose stools.    Marland Kitchen EPINEPHrine 0.3 mg/0.3 mL IJ SOAJ injection epinephrine 0.3 mg/0.3  mL injection, auto-injector  TK UTD PRN    . exemestane (AROMASIN) 25 MG tablet TAKE 1 TABLET (25 MG TOTAL) BY MOUTH DAILY AFTER BREAKFAST. 90 tablet 1  . Hyoscyamine Sulfate SL 0.125 MG SUBL hyoscyamine 0.125 mg sublingual tablet  TAKE 1 TABLET (0.125 MG TOTAL) BY MOUTH EVERY 6 HOURS AS NEEDED FOR UP TO 30 DAYS FOR CRAMPING.    . lansoprazole (PREVACID) 30 MG capsule Take 30 mg by mouth daily at 12 noon.    . levalbuterol (XOPENEX HFA) 45 MCG/ACT inhaler Inhale 2 puffs into the lungs every 4 (four) hours as needed for wheezing (Q4-6 Hours PRN).    Marland Kitchen levothyroxine (SYNTHROID) 88 MCG tablet Take 1 tablet (88 mcg total) by mouth daily before breakfast. 90 tablet 1  . mesalamine (LIALDA) 1.2 g EC tablet Take 2.4 g by mouth in the morning and at bedtime.     . metFORMIN (GLUCOPHAGE) 500 MG tablet Take 1 tablet (500 mg total) by mouth 2 (two) times daily with a meal. 180 tablet 3  . montelukast (SINGULAIR) 10 MG tablet Take 10 mg by mouth at bedtime.     Marland Kitchen olmesartan (BENICAR) 40 MG tablet TAKE 1 TABLET BY MOUTH EVERY DAY 90 tablet 1  . promethazine (PHENERGAN) 25 MG tablet TAKE 1/2-1 TABLET DAILY AS NEEDED FOR NAUSEA 30 tablet 5  . rosuvastatin (CRESTOR) 20 MG tablet Take 1 tablet (20 mg total) by mouth daily.  (Patient taking differently: Take 20 mg by mouth at bedtime.) 90 tablet 3  . triamcinolone (NASACORT) 55 MCG/ACT AERO nasal inhaler Place 1 spray into the nose daily.    Marland Kitchen venlafaxine XR (EFFEXOR-XR) 150 MG 24 hr capsule Take 1 capsule (150 mg total) by mouth daily with breakfast. 90 capsule 1  . zolpidem (AMBIEN) 5 MG tablet Take 1 tablet (5 mg total) by mouth at bedtime. TAKE 1 TABLET BY MOUTH AT BEDTIME AS NEEDED FOR SLEEP 30 tablet 2  . Adalimumab 80 MG/0.8ML PNKT Inject into the skin.     No current facility-administered medications on file prior to visit.    Review of Systems:  As per HPI- otherwise negative.   Physical Examination: Vitals:   04/01/20 1427  BP: 136/84  Pulse: 97  Resp: 16  SpO2: 98%   Vitals:   04/01/20 1427  Weight: 166 lb (75.3 kg)  Height: 5' 3"  (1.6 m)   Body mass index is 29.41 kg/m. Ideal Body Weight: Weight in (lb) to have BMI = 25: 140.8  GEN: no acute distress.  Mild overweight, looks well HEENT: Atraumatic, Normocephalic.  Ears and Nose: No external deformity. CV: RRR, No M/G/R. No JVD. No thrill. No extra heart sounds. PULM: CTA B, no wheezes, crackles, rhonchi. No retractions. No resp. distress. No accessory muscle use. ABD: S, NT, ND, +BS. No rebound. No HSM. EXTR: No c/c/e PSYCH: Normally interactive. Conversant.  She has recently noted a possible enlarged cervical node in her right mid anterior cervical chain.  It is just slightly tender.  She otherwise feels well  EKG: Sinus rhythm with nonspecific T wave abnormality Assessment and Plan: Encounter for preoperative assessment - Plan: EKG 12-Lead  Controlled type 2 diabetes mellitus without complication, without long-term current use of insulin (HCC) - Plan: Hemoglobin A1c  Lymphadenopathy of right cervical region - Plan: US Soft Tissue Head/Neck (NON-THYROID)  Patient is here today for preoperative clearance, she plans to have a total hip replacement Assuming A1c is within  required range, she should be okay  for surgery We will check A1c today Patient has lymphadenopathy of the right anterior cervical chain.  She has history of right-sided breast cancer, understandably this lymph node is concerning her.  We will go ahead and obtain an ultrasound  This visit occurred during the SARS-CoV-2 public health emergency.  Safety protocols were in place, including screening questions prior to the visit, additional usage of staff PPE, and extensive cleaning of exam room while observing appropriate contact time as indicated for disinfecting solutions.    Signed Lamar Blinks, MD Received A1c 12/18-  Results for orders placed or performed in visit on 04/01/20  Hemoglobin A1c  Result Value Ref Range   Hgb A1c MFr Bld 6.3 4.6 - 6.5 %   Message to pt Clear for surgery

## 2020-04-01 NOTE — Patient Instructions (Signed)
It was good to see you today- I will be in touch with your A1c; if your A1c is too high for surgery we will get your sugars under control so you can have your surgery!   Please stop by imaging on the ground floor and see if they can do your neck US now- if not they can schedule it for you

## 2020-04-02 LAB — HEMOGLOBIN A1C: Hgb A1c MFr Bld: 6.3 % (ref 4.6–6.5)

## 2020-04-03 ENCOUNTER — Encounter: Payer: Self-pay | Admitting: Family Medicine

## 2020-04-05 ENCOUNTER — Encounter: Payer: Self-pay | Admitting: Family Medicine

## 2020-04-05 ENCOUNTER — Other Ambulatory Visit: Payer: Self-pay

## 2020-04-05 ENCOUNTER — Ambulatory Visit (HOSPITAL_BASED_OUTPATIENT_CLINIC_OR_DEPARTMENT_OTHER)
Admission: RE | Admit: 2020-04-05 | Discharge: 2020-04-05 | Disposition: A | Discharge status: Home / Self Care | Payer: No Typology Code available for payment source | Source: Ambulatory Visit | Attending: Family Medicine | Admitting: Family Medicine

## 2020-04-05 DIAGNOSIS — R590 Localized enlarged lymph nodes: Secondary | ICD-10-CM | POA: Diagnosis present

## 2020-04-05 DIAGNOSIS — R9431 Abnormal electrocardiogram [ECG] [EKG]: Secondary | ICD-10-CM

## 2020-04-06 ENCOUNTER — Encounter: Payer: Self-pay | Admitting: Family Medicine

## 2020-04-07 ENCOUNTER — Telehealth (INDEPENDENT_AMBULATORY_CARE_PROVIDER_SITE_OTHER): Payer: No Typology Code available for payment source | Admitting: Medical

## 2020-04-07 ENCOUNTER — Other Ambulatory Visit: Payer: Self-pay

## 2020-04-07 VITALS — Temp 98.3°F

## 2020-04-07 DIAGNOSIS — J3089 Other allergic rhinitis: Secondary | ICD-10-CM

## 2020-04-07 DIAGNOSIS — J329 Chronic sinusitis, unspecified: Secondary | ICD-10-CM | POA: Diagnosis not present

## 2020-04-07 MED ORDER — METHYLPREDNISOLONE 4 MG PO TABS: ORAL_TABLET | ORAL | 0 refills | Status: DC

## 2020-04-07 MED ORDER — AZITHROMYCIN 250 MG PO TABS: ORAL_TABLET | ORAL | 0 refills | Status: DC

## 2020-04-07 NOTE — Progress Notes (Signed)
° °  Subjective:    Patient ID: Erin Fields, female    DOB: 1965/08/05, 54 y.o.   MRN: [id number]  HPI  Virtual Visit via Video Note  I connected with Erin Fields on 04/07/20 at 11:20 AM EST by a video enabled telemedicine application and verified that I am speaking with the correct person using two identifiers.  Location: Patient: home Provider: home   I discussed the limitations of evaluation and management by telemedicine and the availability of in person appointments. The patient expressed understanding and agreed to proceed.  History of Present Illness:   Pt states she has been sick with a lot of sinus pressure. She has tinge of yellow mucus. She has runny nose. Hx of allergies this time of year.  In past with temperature changes from days will get congested. Pt had no fever, no chills or sweats.  No bodyaches or joint pain. Pt has used afrin for 3 days.  No wheezing or shortness of breath today. But some last week. Pt is on symbicort.  Pt has been vaccinated with covid booster and flu vaccine.  Pt is on nasocort and afrin.  Pt request low dose steroids.   Observations/Objective: General-no acute distress, pleasant, oriented. Lungs- on inspection lungs appear unlabored. Neck- no tracheal deviation or jvd on inspection. Neuro- gross motor function appears intact. heent- tender maxillary and frontal sinus on self palpation.  Assessment and Plan: Signs and symptoms recently of the past 1 to 2 weeks likely represents sinus infection with some allergic rhinitis component.  Prescribed a azithromycin antibiotic and continue use nasal spray(Astelin& Flonase).  Patient given request some prednisone starting that has helped in the past with prior infections.  On review patient's A1c is 6.3 and she is on Metformin.  Counseled patient to eat low sugar diet, continue Metformin and will prescribe 3 short 4-day tapered Medrol course.  I did also advised to go ahead and  stop Afrin to avoid any rebound nasal congestion.  Patient has been fully vaccinated and boosted against Covid.  Did explain to her that in the event her symptoms worsen, change or persist despite above measures then would recommend getting both rapid Covid test and PCR.  Presently doubt Covid infection.  Follow-up in 7 to 10 days or as needed.   Mackie Pai, PA-C   Time spent with patient today was 25  minutes which consisted of chart revdiew, discussing diagnosis, work up treatment and documentation.  Follow Up Instructions:    I discussed the assessment and treatment plan with the patient. The patient was provided an opportunity to ask questions and all were answered. The patient agreed with the plan and demonstrated an understanding of the instructions.   The patient was advised to call back or seek an in-person evaluation if the symptoms worsen or if the condition fails to improve as anticipated.     Mackie Pai, PA-C   Review of Systems     Objective:   Physical Exam        Assessment & Plan:

## 2020-04-07 NOTE — Patient Instructions (Signed)
Signs and symptoms recently of the past 1 to 2 weeks likely represents sinus infection with some allergic rhinitis component.  Prescribed a azithromycin antibiotic and continue use nasal spray(Astelin& Flonase).  Patient given request some prednisone starting that has helped in the past with prior infections.  On review patient's A1c is 6.3 and she is on Metformin.  Counseled patient to eat low sugar diet, continue Metformin and will prescribe 3 short 4-day tapered Medrol course.  I did also advised to go ahead and stop Afrin to avoid any rebound nasal congestion.  Patient has been fully vaccinated and boosted against Covid.  Did explain to her that in the event her symptoms worsen, change or persist despite above measures then would recommend getting both rapid Covid test and PCR.  Presently doubt Covid infection.  Follow-up in 7 to 10 days or as needed.

## 2020-04-08 ENCOUNTER — Other Ambulatory Visit: Payer: No Typology Code available for payment source

## 2020-04-13 ENCOUNTER — Telehealth: Payer: Self-pay | Admitting: Medical

## 2020-04-13 ENCOUNTER — Other Ambulatory Visit: Payer: No Typology Code available for payment source

## 2020-04-13 ENCOUNTER — Other Ambulatory Visit: Payer: Self-pay | Admitting: Family Medicine

## 2020-04-13 ENCOUNTER — Encounter: Payer: Self-pay | Admitting: Family Medicine

## 2020-04-13 ENCOUNTER — Encounter: Payer: Self-pay | Admitting: Medical

## 2020-04-13 DIAGNOSIS — Z20822 Contact with and (suspected) exposure to covid-19: Secondary | ICD-10-CM

## 2020-04-13 DIAGNOSIS — J40 Bronchitis, not specified as acute or chronic: Secondary | ICD-10-CM

## 2020-04-13 MED ORDER — BUDESONIDE-FORMOTEROL FUMARATE 160-4.5 MCG/ACT IN AERO
2.0000 | INHALATION_SPRAY | Freq: Two times a day (BID) | RESPIRATORY_TRACT | 3 refills | Status: DC
Start: 2020-04-13 — End: 2020-09-06

## 2020-04-13 MED ORDER — DOXYCYCLINE HYCLATE 100 MG PO TABS: 100.0000 mg | ORAL_TABLET | Freq: Two times a day (BID) | ORAL | 0 refills | Status: DC

## 2020-04-13 NOTE — Telephone Encounter (Signed)
Rx doxycycline and symbicort sent to pharmacy.

## 2020-04-13 NOTE — Telephone Encounter (Signed)
Chest xray order placed. 

## 2020-04-13 NOTE — Telephone Encounter (Signed)
It looks like her order was closed. Do you know what needs to be done in order for patient to have [place]?

## 2020-04-14 LAB — NOVEL CORONAVIRUS, NAA: SARS-CoV-2, NAA: DETECTED — AB

## 2020-04-14 LAB — SARS-COV-2, NAA 2 DAY TAT

## 2020-04-20 ENCOUNTER — Other Ambulatory Visit (HOSPITAL_COMMUNITY): Payer: No Typology Code available for payment source

## 2020-04-21 ENCOUNTER — Ambulatory Visit (HOSPITAL_BASED_OUTPATIENT_CLINIC_OR_DEPARTMENT_OTHER)
Admission: RE | Admit: 2020-04-21 | Discharge: 2020-04-21 | Disposition: A | Discharge status: Home / Self Care | Payer: No Typology Code available for payment source | Source: Ambulatory Visit | Attending: Medical | Admitting: Medical

## 2020-04-21 ENCOUNTER — Other Ambulatory Visit: Payer: Self-pay

## 2020-04-21 DIAGNOSIS — J40 Bronchitis, not specified as acute or chronic: Secondary | ICD-10-CM | POA: Diagnosis not present

## 2020-04-22 ENCOUNTER — Ambulatory Visit (HOSPITAL_COMMUNITY): Payer: No Typology Code available for payment source | Attending: Family Medicine

## 2020-04-22 ENCOUNTER — Other Ambulatory Visit: Payer: Self-pay | Admitting: Family Medicine

## 2020-04-22 ENCOUNTER — Encounter: Payer: Self-pay | Admitting: Family Medicine

## 2020-04-22 DIAGNOSIS — R9431 Abnormal electrocardiogram [ECG] [EKG]: Secondary | ICD-10-CM | POA: Insufficient documentation

## 2020-04-22 DIAGNOSIS — R9430 Abnormal result of cardiovascular function study, unspecified: Secondary | ICD-10-CM

## 2020-04-22 LAB — ECHOCARDIOGRAM COMPLETE
Area-P 1/2: 3.08 cm2
S' Lateral: 4.5 cm

## 2020-04-22 MED ORDER — PERFLUTREN LIPID MICROSPHERE
1.0000 mL | INTRAVENOUS | Status: AC | PRN
  Administered 2020-04-22: 2 mL via INTRAVENOUS

## 2020-04-26 ENCOUNTER — Other Ambulatory Visit: Payer: No Typology Code available for payment source

## 2020-04-26 ENCOUNTER — Encounter: Payer: Self-pay | Admitting: Family Medicine

## 2020-04-27 DIAGNOSIS — K635 Polyp of colon: Secondary | ICD-10-CM | POA: Insufficient documentation

## 2020-04-27 DIAGNOSIS — E119 Type 2 diabetes mellitus without complications: Secondary | ICD-10-CM | POA: Insufficient documentation

## 2020-04-27 DIAGNOSIS — J301 Allergic rhinitis due to pollen: Secondary | ICD-10-CM

## 2020-04-27 DIAGNOSIS — F32A Depression, unspecified: Secondary | ICD-10-CM | POA: Insufficient documentation

## 2020-04-27 DIAGNOSIS — K219 Gastro-esophageal reflux disease without esophagitis: Secondary | ICD-10-CM | POA: Insufficient documentation

## 2020-04-27 DIAGNOSIS — Z9889 Other specified postprocedural states: Secondary | ICD-10-CM | POA: Insufficient documentation

## 2020-04-27 DIAGNOSIS — J3081 Allergic rhinitis due to animal (cat) (dog) hair and dander: Secondary | ICD-10-CM

## 2020-04-27 DIAGNOSIS — R112 Nausea with vomiting, unspecified: Secondary | ICD-10-CM | POA: Insufficient documentation

## 2020-04-27 DIAGNOSIS — F419 Anxiety disorder, unspecified: Secondary | ICD-10-CM | POA: Insufficient documentation

## 2020-04-27 DIAGNOSIS — G43909 Migraine, unspecified, not intractable, without status migrainosus: Secondary | ICD-10-CM | POA: Insufficient documentation

## 2020-04-27 DIAGNOSIS — J4530 Mild persistent asthma, uncomplicated: Secondary | ICD-10-CM | POA: Insufficient documentation

## 2020-04-27 DIAGNOSIS — Z9109 Other allergy status, other than to drugs and biological substances: Secondary | ICD-10-CM | POA: Insufficient documentation

## 2020-04-27 HISTORY — DX: Allergic rhinitis due to pollen: J30.1

## 2020-04-27 HISTORY — DX: Mild persistent asthma, uncomplicated: J45.30

## 2020-04-27 HISTORY — DX: Allergic rhinitis due to animal (cat) (dog) hair and dander: J30.81

## 2020-04-27 NOTE — Telephone Encounter (Signed)
Called [facility] and let them know that Erin Fields's surgery will likely need to be delayed - they plan to send a clearance to Dr Raliegh Ip for after his eval

## 2020-04-28 ENCOUNTER — Encounter (HOSPITAL_COMMUNITY): Admission: RE | Admit: 2020-04-28 | Payer: No Typology Code available for payment source | Source: Ambulatory Visit

## 2020-04-29 ENCOUNTER — Encounter: Payer: Self-pay | Admitting: Cardiology

## 2020-04-29 ENCOUNTER — Other Ambulatory Visit: Payer: Self-pay | Admitting: Hematology

## 2020-04-29 ENCOUNTER — Ambulatory Visit: Payer: No Typology Code available for payment source | Admitting: Cardiology

## 2020-04-29 ENCOUNTER — Other Ambulatory Visit: Payer: Self-pay

## 2020-04-29 VITALS — BP 138/84 | Ht 63.0 in | Wt 159.0 lb

## 2020-04-29 DIAGNOSIS — C50411 Malignant neoplasm of upper-outer quadrant of right female breast: Secondary | ICD-10-CM

## 2020-04-29 DIAGNOSIS — Z170 Estrogen receptor positive status [ER+]: Secondary | ICD-10-CM

## 2020-04-29 DIAGNOSIS — I420 Dilated cardiomyopathy: Secondary | ICD-10-CM

## 2020-04-29 DIAGNOSIS — I5022 Chronic systolic (congestive) heart failure: Secondary | ICD-10-CM

## 2020-04-29 DIAGNOSIS — I509 Heart failure, unspecified: Secondary | ICD-10-CM | POA: Insufficient documentation

## 2020-04-29 DIAGNOSIS — E119 Type 2 diabetes mellitus without complications: Secondary | ICD-10-CM | POA: Diagnosis not present

## 2020-04-29 HISTORY — DX: Heart failure, unspecified: I50.9

## 2020-04-29 HISTORY — DX: Dilated cardiomyopathy: I42.0

## 2020-04-29 MED ORDER — CARVEDILOL 3.125 MG PO TABS: 3.1250 mg | ORAL_TABLET | Freq: Two times a day (BID) | ORAL | 2 refills | Status: DC

## 2020-04-29 MED ORDER — ENTRESTO 24-26 MG PO TABS: 1.0000 | ORAL_TABLET | Freq: Two times a day (BID) | ORAL | 2 refills | Status: DC

## 2020-04-29 MED ORDER — ASPIRIN EC 81 MG PO TBEC: 81.0000 mg | DELAYED_RELEASE_TABLET | Freq: Every day | ORAL | 3 refills | Status: DC

## 2020-04-29 NOTE — Patient Instructions (Signed)
Medication Instructions:  Your physician has recommended you make the following change in your medication:    STOP: Benicar  START: Carvedilol 3.25 mg twice daily (if begin wheezing stop the medication and call our office)  START: Aspirin 81 mg daily   AFTER 3 days of stopping Benicar: START Entresto 24/26 mg twice daily   *If you need a refill on your cardiac medications before your next appointment, please call your pharmacy*   Lab Work: None If you have labs (blood work) drawn today and your tests are completely normal, you will receive your results only by: Marland Kitchen MyChart Message (if you have MyChart) OR . A paper copy in the mail If you have any lab test that is abnormal or we need to change your treatment, we will call you to review the results.   Testing/Procedures: None   Follow-Up: At [facility], you and your health needs are our priority.  As part of our continuing mission to provide you with exceptional heart care, we have created designated Provider Care Teams.  These Care Teams include your primary Cardiologist (physician) and Advanced Practice Providers (APPs -  Physician Assistants and Nurse Practitioners) who all work together to provide you with the care you need, when you need it.  We recommend signing up for the patient portal called "MyChart".  Sign up information is provided on this After Visit Summary.  MyChart is used to connect with patients for Virtual Visits (Telemedicine).  Patients are able to view lab/test results, encounter notes, upcoming appointments, etc.  Non-urgent messages can be sent to your provider as well.   To learn more about what you can do with MyChart, go to [email or web address].    Your next appointment:   2 week(s)  The format for your next appointment:   In Person  Provider:   Jenne Campus, MD   Other Instructions   Aspirin and Your Heart Aspirin is a medicine that prevents the platelets in your blood from sticking  together. Platelets are the cells that your blood uses for clotting. Aspirin can be used to help reduce the risk of blood clots, heart attacks, and other heart-related problems. What are the risks? Daily use of aspirin can cause side effects. Some of these include:  Bleeding. Bleeding can be minor or serious. An example of minor bleeding is bleeding from a cut, and the bleeding does not stop. An example of more serious bleeding is stomach bleeding or, rarely, bleeding into the brain. Your risk of bleeding increases if you are also taking NSAIDs, such as ibuprofen.  Increased bruising.  Upset stomach.  An allergic reaction. People who have growths inside the nose (nasal polyps) have an increased risk of developing an aspirin allergy. How to use aspirin to care for your heart  Take aspirin only as told by your health care provider. Make sure that you understand how much to take and what form to take. The two forms of aspirin are: ? Non-enteric-coated.This type of aspirin does not have a coating and is absorbed quickly. This type of aspirin also comes in a chewable form. ? Enteric-coated. This type of aspirin has a coating that releases the medicine very slowly. Enteric-coated aspirin might cause less stomach upset than non-enteric-coated aspirin. This type of aspirin should not be chewed or crushed.  Work with your health care provider to find out whether it is safe and beneficial for you to take aspirin daily. Taking aspirin daily may be helpful if: ? You have had  a heart attack or chest pain, or you are at risk for a heart attack. ? You have a condition in which certain heart vessels are blocked (coronary artery disease), and you have had a procedure to treat it. Examples are:  Open-heart surgery, such as coronary artery bypass surgery (CABG).  Coronary angioplasty,which is done to widen a blood vessel of your heart.  Having a small mesh tube, or stent, placed in your coronary artery. ? You  have had certain types of stroke or a mini-stroke known as a transient ischemic attack (TIA). ? You have a narrowing of the arteries that supply the limbs (peripheral artery disease, or PAD). ? You have long-term (chronic) heart rhythm problems, such as atrial fibrillation, and your health care provider thinks aspirin may help. ? You have valve disease or have had surgery on a valve. ? You are considered at increased risk of developing coronary artery disease or PAD.   Follow these instructions at home Medicines  Take over-the-counter and prescription medicines only as told by your health care provider.  If you are taking blood thinners: ? Talk with your health care provider before you take any medicines that contain aspirin or NSAIDs, such as ibuprofen. These medicines increase your risk for dangerous bleeding. ? Take your medicine exactly as told, at the same time every day. ? Avoid activities that could cause injury or bruising, and follow instructions about how to prevent falls. ? Wear a medical alert bracelet or carry a card that lists what medicines you take. General instructions  Do not drink alcohol if: ? Your health care provider tells you not to drink. ? You are pregnant, may be pregnant, or are planning to become pregnant.  If you drink alcohol: ? Limit how much you use to:  0-1 drink a day for women.  0-2 drinks a day for men. ? Be aware of how much alcohol is in your drink. In the [place] one drink equals one 12 oz bottle of beer (355 mL), one 5 oz glass of wine (148 mL), or one 1 oz glass of hard liquor (44 mL).  Keep all follow-up visits as told by your health care provider. This is important. Where to find more information  The American Heart Association: www.heart.org Contact a health care provider if you have:  Unusual bleeding or bruising.  Stomach pain or nausea.  Ringing in your ears.  An allergic reaction that causes hives, itchy skin, or swelling of the  lips, tongue, or face. Get help right away if:  You notice that your bowel movements are bloody, or dark red or black in color.  You vomit or cough up blood.  You have blood in your urine.  You cough, breathe loudly (wheeze), or feel short of breath.  You have chest pain, especially if the pain spreads to your arms, back, neck, or jaw.  You have a headache with confusion. You have any symptoms of a stroke. "BE FAST" is an easy way to remember the main warning signs of a stroke:  B - Balance. Signs are dizziness, sudden trouble walking, or loss of balance.  E - Eyes. Signs are trouble seeing or a sudden change in vision.  F - Face. Signs are sudden weakness or numbness of the face, or the face or eyelid drooping on one side.  A - Arms. Signs are weakness or numbness in an arm. This happens suddenly and usually on one side of the body.  S - Speech. Signs  are sudden trouble speaking, slurred speech, or trouble understanding what people say.  T - Time. Time to call emergency services. Write down what time symptoms started. You have other signs of a stroke, such as:  A sudden, severe headache with no known cause.  Nausea or vomiting.  Seizure. These symptoms may represent a serious problem that is an emergency. Do not wait to see if the symptoms will go away. Get medical help right away. Call your local emergency services (911 in the [place] Do not drive yourself to the hospital. Summary  Aspirin use can help reduce the risk of blood clots, heart attacks, and other heart-related problems.  Daily use of aspirin can cause side effects.  Take aspirin only as told by your health care provider. Make sure that you understand how much to take and what form to take.  Your health care provider will help you determine whether it is safe and beneficial for you to take aspirin daily. This information is not intended to replace advice given to you by your health care provider. Make sure you  discuss any questions you have with your health care provider. Document Revised: 01/06/2019 Document Reviewed: 01/06/2019 Elsevier Patient Education  [place]oley; Valsartan Oral Tablets What is this medicine? SACUBITRIL; VALSARTAN (sak UE bi tril; val SAR tan) is a combination of a neprilysin inhibitor and a an angiotensin II receptor blocker. It treats heart failure. This medicine may be used for other purposes; ask your health care provider or pharmacist if you have questions. COMMON BRAND NAME(S): Entresto What should I tell my health care provider before I take this medicine? They need to know if you have any of these conditions:  diabetes and take a medicine that contains aliskiren  high levels of potassium in the blood  kidney disease  liver disease  low blood pressure  an unusual or allergic reaction to sacubitril; valsartan, drugs called angiotensin converting enzyme (ACE) inhibitors, angiotensin II receptor blockers (ARBs), other medicines, foods, dyes, or preservatives  pregnant or trying to get pregnant  breast-feeding How should I use this medicine? Take this medicine by mouth. Take it as directed on the prescription label at the same time every day. You can take it with or without food. If it upsets your stomach, take it with food. Keep taking it unless your health care provider tells you to stop. Talk to your health care provider about the use of this drug in children. While it may be prescribed for children as young as 1 for selected conditions, precautions do apply. Overdosage: If you think you have taken too much of this medicine contact a poison control center or emergency room at once. NOTE: This medicine is only for you. Do not share this medicine with others. What if I miss a dose? If you miss a dose, take it as soon as you can. If it is almost time for your next dose, take only that dose. Do not take double or extra doses. What may interact  with this medicine? Do not take this medicine with any of the following medicines:  aliskiren if you have diabetes  angiotensin-converting enzyme (ACE) inhibitors, like benazepril, captopril, enalapril, fosinopril, lisinopril, or ramipril  tranylcypromine This medicine may also interact with the following medicines:  angiotensin II receptor blockers (ARBs) like azilsartan, candesartan, eprosartan, irbesartan, losartan, olmesartan, telmisartan, or valsartan  celecoxib  lithium  NSAIDS, medicines for pain and inflammation, like ibuprofen or naproxen  potassium-sparing diuretics like amiloride, spironolactone,  and triamterene  potassium supplements This list may not describe all possible interactions. Give your health care provider a list of all the medicines, herbs, non-prescription drugs, or dietary supplements you use. Also tell them if you smoke, drink alcohol, or use illegal drugs. Some items may interact with your medicine. What should I watch for while using this medicine? Tell your doctor or health care provider if your symptoms do not start to get better or if they get worse. Do not become pregnant while taking this medicine. Women should inform their health care provider if they wish to become pregnant or think they might be pregnant. There is a potential for serious harm to an unborn child. Talk to your health care provider for more information. You may get drowsy or dizzy. Do not drive, use machinery, or do anything that needs mental alertness until you know how this medicine affects you. Do not stand or sit up quickly, especially if you are an older patient. This reduces the risk of dizzy or fainting spells. Alcohol may interfere with the effects of this medicine. Avoid alcoholic drinks. Avoid salt substitutes unless you are told otherwise by your health care provider. What side effects may I notice from receiving this medicine? Side effects that you should report to your  doctor or health care provider as soon as possible:  allergic reactions (skin rash, itching or hives; swelling of the face, lips, or tongue)  high potassium levels (chest pain; fast, irregular heartbeat; muscle weakness)  kidney injury (trouble passing urine or change in the amount of urine)  low blood pressure (dizziness; feeling faint or lightheaded, falls; unusually weak or tired) Side effects that usually do not require medical attention (report to your doctor or health care provider if they continue or are bothersome):  cough This list may not describe all possible side effects. Call your doctor for medical advice about side effects. You may report side effects to FDA at 1-800-FDA-1088. Where should I keep my medicine? Keep out of the reach of children and pets. Store at room temperature between 20 and 25 degrees C (68 and 77 degrees F). Protect from moisture. Keep the container tightly closed. Get rid of any unused medicine after the expiration date. To get rid of medicines that are no longer needed or have expired:  Take the medicine to a take-back program. Check with your pharmacy or law enforcement to find a location.  If you cannot return the medicine, check the label or package insert to see if the medicine should be thrown out in the garbage or flushed down the toilet. If you are not sure, ask your health care provider. If it is safe to put it in the trash, empty the medicine out of the container. Mix the medicine with cat litter, dirt, coffee grounds, or other unwanted substance. Seal the mixture in a bag or container. Put it in the trash. NOTE: This sheet is a summary. It may not cover all possible information. If you have questions about this medicine, talk to your doctor, pharmacist, or health care provider.  2021 Elsevier/Gold Standard (2019-06-09 11:23:32)  Carvedilol Tablets What is this medicine? CARVEDILOL (KAR ve dil ol) is a beta blocker. It decreases the amount of  work your heart has to do and helps your heart beat regularly. It treats high blood pressure. This medicine may be used for other purposes; ask your health care provider or pharmacist if you have questions. COMMON BRAND NAME(S): Coreg What should I tell my  health care provider before I take this medicine? They need to know if you have any of these conditions:  circulation problems  diabetes  history of heart attack or heart disease  liver disease  lung or breathing disease, like asthma or emphysema  pheochromocytoma  slow or irregular heartbeat  thyroid disease  an unusual or allergic reaction to carvedilol, other beta-blockers, medicines, foods, dyes, or preservatives  pregnant or trying to get pregnant  breast-feeding How should I use this medicine? Take this drug by mouth. Take it as directed on the prescription label at the same time every day. Take it with food. Keep taking it unless your health care provider tells you to stop. Talk to your health care provider about the use of this drug in children. Special care may be needed. Overdosage: If you think you have taken too much of this medicine contact a poison control center or emergency room at once. NOTE: This medicine is only for you. Do not share this medicine with others. What if I miss a dose? If you miss a dose, take it as soon as you can. If it is almost time for your next dose, take only that dose. Do not take double or extra doses. What may interact with this medicine? This medicine may interact with the following medications:  certain medicines for blood pressure, heart disease, irregular heart beat  certain medicines for depression, like fluoxetine or paroxetine  certain medicines for diabetes, like glipizide or glyburide  cimetidine  clonidine  cyclosporine  digoxin  MAOIs like Carbex, Eldepryl, Marplan, Nardil, and Parnate  reserpine  rifampin This list may not describe all possible  interactions. Give your health care provider a list of all the medicines, herbs, non-prescription drugs, or dietary supplements you use. Also tell them if you smoke, drink alcohol, or use illegal drugs. Some items may interact with your medicine. What should I watch for while using this medicine? Check your heart rate and blood pressure regularly while you are taking this medicine. Ask your doctor or health care professional what your heart rate and blood pressure should be, and when you should contact him or her. Do not stop taking this medicine suddenly. This could lead to serious heart-related effects. Contact your doctor or health care professional if you have difficulty breathing while taking this drug. Check your weight daily. Ask your doctor or health care professional when you should notify him/her of any weight gain. You may get drowsy or dizzy. Do not drive, use machinery, or do anything that requires mental alertness until you know how this medicine affects you. To reduce the risk of dizzy or fainting spells, do not sit or stand up quickly. Alcohol can make you more drowsy, and increase flushing and rapid heartbeats. Avoid alcoholic drinks. This medicine may increase blood sugar. Ask your healthcare provider if changes in diet or medicines are needed if you have diabetes. If you are going to have surgery, tell your doctor or health care professional that you are taking this medicine. What side effects may I notice from receiving this medicine? Side effects that you should report to your doctor or health care professional as soon as possible:  allergic reactions like skin rash, itching or hives, swelling of the face, lips, or tongue  breathing problems  dark urine  irregular heartbeat   signs and symptoms of high blood sugar such as being more thirsty or hungry or having to urinate more than normal. You may also feel very tired  or have blurry vision.  swollen legs or  ankles  vomiting  yellowing of the eyes or skin Side effects that usually do not require medical attention (report to your doctor or health care professional if they continue or are bothersome):  change in sex drive or performance  diarrhea  dry eyes (especially if wearing contact lenses)  dry, itching skin  headache  nausea  unusually tired This list may not describe all possible side effects. Call your doctor for medical advice about side effects. You may report side effects to FDA at 1-800-FDA-1088. Where should I keep my medicine? Keep out of the reach of children and pets. Store at room temperature between 20 and 25 degrees C (68 and 77 degrees F). Protect from moisture. Keep the container tightly closed. Throw away any unused drug after the expiration date. NOTE: This sheet is a summary. It may not cover all possible information. If you have questions about this medicine, talk to your doctor, pharmacist, or health care provider.  2021 Elsevier/Gold Standard (2018-11-08 17:42:09)

## 2020-04-29 NOTE — Progress Notes (Signed)
Cardiology Consultation:    Date:  04/29/2020   [name], DOB Jul 03, 1965, MRN [id number]  PCP:  Darreld Mclean, MD  Cardiologist:  Jenne Campus, MD   Referring MD: Darreld Mclean, MD   Chief Complaint  Patient presents with  . Left Ejection fraction 25%  . Chest Pain    History of Present Illness:    Erin Fields is a 55 y.o. female who is being seen today for the evaluation of I have a weak heart at the request of Copland, Gay Filler, MD.  Past medical history significant for diabetes diagnosed about 2 to 3 years ago which is initially poorly controlled now much better, he still breast cancer that required surgery, radiation as well as hormonal therapy, essential hypertension, dyslipidemia.  She is still having trouble with the right hip when she was scheduled to have surgery as a part of work-up EKG was done which showed some nonspecific changes after that echocardiogram which revealed surprisingly severe reduction of left ventricle ejection fraction to 25%.  Overall she says she is doing fine she does not have much shortness of breath she did notice some shortness of breath but she blames it on the fact that she cannot do much because of pain in her hip.  She also tells me that she has been having some heartburn for many years apparently she did have quite significant work-up done about 10 years ago which included stress test all came negative.  She described pain located in the left side of her chest sharp lasting rather short.  Time not related to exercise typically happening at rest.  She does not have any typical exertional chest pain.  She did not onset of swelling of lower extremities no proximal nocturnal dyspnea. She does have multiple risk factors for coronary artery disease namely diabetes history of smoking likely she quit, dyslipidemia hypertension. She also does have family history of premature coronary artery disease. She drink alcohol occasionally  however not excessively. She does not exercise on a regular basis.  Past Medical History:  Diagnosis Date  . Allergic rhinitis due to animal (cat) (dog) hair and dander 04/27/2020  . Allergic rhinitis due to pollen 04/27/2020  . Anxiety   . Anxiety and depression 01/20/2018  . Asthma   . Asthma, persistent controlled 10/05/2018  . B12 deficiency 11/27/2019  . Borderline diabetes 12/17/2015  . Cancer [facility]) 2019   right breast cancer  . CERVICAL CANCER, HX OF 12/06/2006   Qualifier: Diagnosis of  By: Arnoldo Morale MD, Balinda Quails   . Chronic sinusitis 01/26/2015  . Colon polyps   . Controlled type 2 diabetes mellitus without complication, without long-term current use of insulin ([place]) 05/28/2017  . Crohn disease ([place]) 11/29/2016  . Depression   . Diabetes mellitus without complication ([place])    type 2  . Dyspnea 09/15/2014  . Elevated triglycerides with high cholesterol 11/26/2013  . Environmental allergies   . Eustachian tube dysfunction 12/04/2014  . Ex-smoker 11/18/2015  . Family history of breast cancer   . Genetic testing 07/16/2017   Negative genetic testing on the multi-cancer panel.  The Multi-Gene Panel offered by [name] includes sequencing and/or deletion duplication testing of the following 83 genes: ALK, APC, ATM, AXIN2,BAP1,  BARD1, BLM, BMPR1A, BRCA1, BRCA2, BRIP1, CASR, CDC73, CDH1, CDK4, CDKN1B, CDKN1C, CDKN2A (p14ARF), CDKN2A (p16INK4a), CEBPA, CHEK2, CTNNA1, DICER1, DIS3L2, EGFR (c.2369C>T, p.Thr790Met variant onl  . GERD (gastroesophageal reflux disease)   . HEADACHE 12/06/2006  Qualifier: Diagnosis of  By: Arnoldo Morale MD, Balinda Quails   . History of adenomatous polyp of colon 11/19/2017  . Hyperlipidemia   . Hypertension   . Hypothyroidism   . IDA (iron deficiency anemia) 11/06/2019  . Impaired fasting glucose 12/12/2017  . Insomnia 08/20/2013  . Irritable bowel syndrome 11/29/2006   Qualifier: Diagnosis of  By: Arnoldo Morale MD, John E   . Left sided abdominal pain 12/13/2017  . Liver hemangioma 08/24/2014   . Malignant neoplasm of upper-outer quadrant of right breast in female, estrogen receptor positive ([place]) 03/20/2017  . Migraines   . Mild persistent asthma, uncomplicated 0/04/7492  . Nasal polyp, unspecified 01/10/2018  . Oral herpes 04/20/2013  . Other allergic rhinitis 09/15/2014  . Personal history of radiation therapy 2019   36 radiation tx  . PONV (postoperative nausea and vomiting)    scopolamine patch helps  . Recurrent infections 10/05/2018  . Right upper lobe pulmonary nodule 01/10/2018  . Symptomatic varicose veins, right 09/02/2015  . Ulcerative colitis ([place])   . Varicose veins     Past Surgical History:  Procedure Laterality Date  . BREAST BIOPSY Right 02/2017  . BREAST LUMPECTOMY WITH RADIOACTIVE SEED AND SENTINEL LYMPH NODE BIOPSY Right 04/24/2017   Procedure: RIGHT BREAST LUMPECTOMY WITH RADIOACTIVE SEED AND SENTINEL LYMPH NODE BIOPSY;  Surgeon: [name], MD;  Location: [place];  Service: General;  Laterality: Right;  . crapal tunnel relase Left 05/2018  . ENDOMETRIAL ABLATION  2010   Had abnormal uterine bleeding (heavy and frequent), performed in [place], menses stopped completely 2 years after  . EXPLORATORY LAPAROTOMY     Had exploratory surgery at the age of 78 for abdominal pain with cyst found on her left ovary, not treated surgically  . FOOT SURGERY Right 2012  . HAND SURGERY Right   . RE-EXCISION OF BREAST LUMPECTOMY Right 05/15/2017   Procedure: RE-EXCISION OF BREAST LUMPECTOMY;  Surgeon: [name], MD;  Location: [place];  Service: General;  Laterality: Right;  . ROBOTIC ASSISTED SALPINGO OOPHERECTOMY Bilateral 08/29/2019   Procedure: XI ROBOTIC ASSISTED SALPINGO OOPHORECTOMY;  Surgeon: [name], MD;  Location: [facility];  Service: Gynecology;  Laterality: Bilateral;  . Septoplasty with turbinate reduction    . TONSILLECTOMY  1980  . VEIN SURGERY     Removal Right Leg  . WISDOM TOOTH  EXTRACTION      Current Medications: Current Meds  Medication Sig  . acyclovir (ZOVIRAX) 400 MG tablet TAKE 1 TABLET (400 MG TOTAL) BY MOUTH 3 (THREE) TIMES DAILY AS NEEDED. (Patient taking differently: Take 400 mg by mouth 3 (three) times daily as needed (cold sores).)  . Adalimumab 80 MG/0.8ML PNKT Inject 80 mg into the skin every 14 (fourteen) days.  Marland Kitchen ascorbic acid (VITAMIN C) 500 MG tablet Take 500 mg by mouth daily.  Marland Kitchen azelastine (ASTELIN) 0.1 % nasal spray Place 2 sprays into both nostrils 2 (two) times daily.  . budesonide-formoterol (SYMBICORT) 160-4.5 MCG/ACT inhaler Inhale 2 puffs into the lungs 2 (two) times daily.  . cholecalciferol (VITAMIN D3) 25 MCG (1000 UNIT) tablet Take 1,000 Units by mouth daily.  . diphenoxylate-atropine (LOMOTIL) 2.5-0.025 MG tablet Take 1 tablet by mouth 4 (four) times daily as needed for diarrhea or loose stools.  Marland Kitchen EPINEPHrine 0.3 mg/0.3 mL IJ SOAJ injection Inject 0.3 mg into the muscle as needed for anaphylaxis.  Marland Kitchen exemestane (AROMASIN) 25 MG tablet TAKE 1 TABLET (25 MG TOTAL) BY MOUTH DAILY AFTER  BREAKFAST.  Marland Kitchen Hyoscyamine Sulfate SL 0.125 MG SUBL Take 0.125 mg by mouth every 6 (six) hours as needed (cramping).  . lansoprazole (PREVACID) 30 MG capsule Take 30 mg by mouth daily.  Marland Kitchen levalbuterol (XOPENEX HFA) 45 MCG/ACT inhaler Inhale 2 puffs into the lungs every 4 (four) hours as needed for wheezing (Q4-6 Hours PRN).  Marland Kitchen levothyroxine (SYNTHROID) 88 MCG tablet Take 1 tablet (88 mcg total) by mouth daily before breakfast.  . meloxicam (MOBIC) 15 MG tablet Take 15 mg by mouth daily.  . mesalamine (LIALDA) 1.2 g EC tablet Take 2.4 g by mouth in the morning and at bedtime.   . metFORMIN (GLUCOPHAGE) 500 MG tablet Take 1 tablet (500 mg total) by mouth 2 (two) times daily with a meal.  . montelukast (SINGULAIR) 10 MG tablet Take 10 mg by mouth at bedtime.   Marland Kitchen olmesartan (BENICAR) 40 MG tablet TAKE 1 TABLET BY MOUTH EVERY DAY (Patient taking differently: Take  40 mg by mouth daily.)  . promethazine (PHENERGAN) 25 MG tablet Take 0.5-1 tablets (12.5-25 mg total) by mouth daily as needed for nausea or vomiting.  . rosuvastatin (CRESTOR) 20 MG tablet Take 1 tablet (20 mg total) by mouth daily. (Patient taking differently: Take 20 mg by mouth at bedtime.)  . triamcinolone (NASACORT) 55 MCG/ACT AERO nasal inhaler Place 1 spray into the nose daily.  Marland Kitchen venlafaxine XR (EFFEXOR-XR) 150 MG 24 hr capsule Take 1 capsule (150 mg total) by mouth daily with breakfast.  . zinc gluconate 50 MG tablet Take 50 mg by mouth daily.  Marland Kitchen zolpidem (AMBIEN) 5 MG tablet Take 1 tablet (5 mg total) by mouth at bedtime. TAKE 1 TABLET BY MOUTH AT BEDTIME AS NEEDED FOR SLEEP (Patient taking differently: Take 5 mg by mouth at bedtime.)     Allergies:   Losartan potassium, Ace inhibitors, Aspirin, Cefaclor, Oxycodone, Sulfa antibiotics, Lisinopril, and Other   Social History   Socioeconomic History  . Marital status: Widowed    Spouse name: Not on file  . Number of children: Not on file  . Years of education: Not on file  . Highest education level: Not on file  Occupational History  . Not on file  Tobacco Use  . Smoking status: Former Smoker    Packs/day: 3.00    Years: 15.00    Pack years: 45.00    Types: Cigarettes    Quit date: 09/14/1993    Years since quitting: 26.6  . Smokeless tobacco: Never Used  . Tobacco comment: Quit 26 yrs ago  Vaping Use  . Vaping Use: Never used  Substance and Sexual Activity  . Alcohol use: Yes    Comment: Drinks a couple every 2-3 months  . Drug use: No  . Sexual activity: Not Currently    Comment: ablation  Other Topics Concern  . Not on file  Social History Narrative  . Not on file   Social Determinants of Health   Financial Resource Strain: Not on file  Food Insecurity: Not on file  Transportation Needs: Not on file  Physical Activity: Not on file  Stress: Not on file  Social Connections: Not on file     Family  History: The patient's family history includes Arthritis (age of onset: 42) in her mother; Brain cancer in her father; Breast cancer in her mother; Colitis in her sister; Crohn's disease in her mother; Diabetes in her maternal grandfather and mother; Heart attack in her maternal grandfather; Heart disease in her maternal grandfather  and mother; Hyperlipidemia in her mother; Hypertension in her mother; Leukemia in her maternal grandfather; Leukemia (age of onset: 71) in her cousin; Lung cancer in her father and mother. ROS:   Please see the history of present illness.    All 14 point review of systems negative except as described per history of present illness.  EKGs/Labs/Other Studies Reviewed:    The following studies were reviewed today: Echocardiogram reviewed personally.  Truly shows significant reduction Ventricle ejection fraction  EKG:  EKG is  ordered today.  The ekg ordered today demonstrates normal sinus rhythm, normal P interval, normal QS complex duration morphology, nonspecific ST segment changes in form of biphasic T wave in the lateral leads as well as flat T waves inferior leads.  Recent Labs: 06/18/2019: Magnesium 1.8; TSH 1.28 02/11/2020: ALT 28; BUN 6; Creatinine 0.74; Hemoglobin 14.0; Platelet Count 357; Potassium 4.2; Sodium 136  Recent Lipid Panel    Component Value Date/Time   CHOL 153 06/18/2019 0952   TRIG 159.0 (H) 06/18/2019 0952   HDL 37.50 (L) 06/18/2019 0952   CHOLHDL 4 06/18/2019 0952   VLDL 31.8 06/18/2019 0952   LDLCALC 83 06/18/2019 0952   LDLDIRECT 101.0 12/02/2018 0955    Physical Exam:    VS:  Ht 5' 3"  (1.6 m)   Wt 159 lb (72.1 kg)   SpO2 91%   BMI 28.17 kg/m     Wt Readings from Last 3 Encounters:  04/29/20 159 lb (72.1 kg)  04/01/20 166 lb (75.3 kg)  02/11/20 171 lb 12.8 oz (77.9 kg)     GEN:  Well nourished, well developed in no acute distress HEENT: Normal NECK: No JVD; No carotid bruits LYMPHATICS: No lymphadenopathy CARDIAC: RRR,  no murmurs, no rubs, no gallops RESPIRATORY:  Clear to auscultation without rales, wheezing or rhonchi  ABDOMEN: Soft, non-tender, non-distended MUSCULOSKELETAL:  No edema; No deformity  SKIN: Warm and dry NEUROLOGIC:  Alert and oriented x 3 PSYCHIATRIC:  Normal affect   ASSESSMENT:    1. Diabetes mellitus without complication ([place])   2. Dilated cardiomyopathy ([place]) ejection fraction 25% by echocardiogram from January 2022   3. Chronic systolic congestive heart failure ([place])   4. Malignant neoplasm of upper-outer quadrant of right breast in female, estrogen receptor positive ([place])    PLAN:    In order of problems listed above:  1. Dilated cardiomyopathy with ejection fraction 25% which is surprisingly finding.  She is hemodynamically compensated she is in [place] class I/II.  Etiology of this phenomenon is unclear at this moment however with her multiple risk factors for coronary artery disease we must rule out coronary artery disease.  For now what I will do I will start her with appropriate medications.  Therefore, we will start her with carvedilol 3.125 twice daily.  She does have history of bronchial asthma and 1, potentially worsening of this condition with carvedilol however I hope she will be able to tolerate some dose of this medication beta-blockade is essential for the treatment of her cardiomyopathy.  Also a 3 days later if she is able to tolerate the carvedilol we will switch her from Benicar to [facility] 24/26.  We will gradually titrate her medications it with the future addition of Aldactone as well as adding some medication from SGLT 2+ which will be either [place] or [place].  I also initiated conversation about potentially doing cardiac catheterization in the future to make sure she does not have significant coronary disease. 2. Congestive  heart failure she is compensated plan as outlined above. 3. History of malignant breast cancer in remission doing well  she received hormonal therapy but no chemotherapy otherwise.  There is no drug that I can see that could create cardiomyopathy. 4. Diabetes mellitus much better control right now.  She understands she need to.  Get good control of diabetes. 5. Dyslipidemia she is already on Crestor which I will continue.  I did review her K PN which show me her LDL of 83 HDL 37.  We will add aspirin to her medical regimen   Medication Adjustments/Labs and Tests Ordered: Current medicines are reviewed at length with the patient today.  Concerns regarding medicines are outlined above.  No orders of the defined types were placed in this encounter.  No orders of the defined types were placed in this encounter.   Signed, Park Liter, MD, [facility]. 04/29/2020 9:22 AM    [place]

## 2020-05-04 ENCOUNTER — Ambulatory Visit: Admit: 2020-05-04 | Payer: No Typology Code available for payment source | Admitting: Orthopedic Surgery

## 2020-05-04 SURGERY — ARTHROPLASTY, HIP, TOTAL, ANTERIOR APPROACH
Anesthesia: Choice | Site: Hip | Laterality: Right

## 2020-05-05 ENCOUNTER — Encounter: Payer: Self-pay | Admitting: Hematology

## 2020-05-05 ENCOUNTER — Telehealth: Payer: Self-pay | Admitting: Hematology

## 2020-05-05 NOTE — Telephone Encounter (Signed)
I reviewed her chart and called back. Pt was diagnosed with dilated cardiomyopathy lately and saw cardiologist Dr. Agustin Cree. I agree with Dr. Agustin Cree that she has multiple risk factors for CAD and hear failure, and I do not think exemestane is the main contributor or has anything to do with her heart failure. Antiestrogen therapy in general does slightly increase risk of CVD due to it's negative impact on metabolism, but do not directly cause cardiomyopathy.   I recommend her to continue exemestane, and call us again if her cardiac work up all came back negative and she wants to come off exemestane for severe month to see if her EF improves. She voiced good understanding and agrees with the plan.  [name]  05/05/2020

## 2020-05-10 ENCOUNTER — Ambulatory Visit: Payer: No Typology Code available for payment source

## 2020-05-10 ENCOUNTER — Other Ambulatory Visit: Payer: Self-pay | Admitting: Family Medicine

## 2020-05-10 ENCOUNTER — Ambulatory Visit
Admission: RE | Admit: 2020-05-10 | Discharge: 2020-05-10 | Disposition: A | Discharge status: Home / Self Care | Payer: No Typology Code available for payment source | Source: Ambulatory Visit | Attending: Nurse Practitioner | Admitting: Nurse Practitioner

## 2020-05-10 ENCOUNTER — Other Ambulatory Visit: Payer: Self-pay

## 2020-05-10 DIAGNOSIS — Z170 Estrogen receptor positive status [ER+]: Secondary | ICD-10-CM

## 2020-05-10 DIAGNOSIS — C50411 Malignant neoplasm of upper-outer quadrant of right female breast: Secondary | ICD-10-CM

## 2020-05-10 DIAGNOSIS — F430 Acute stress reaction: Secondary | ICD-10-CM

## 2020-05-12 ENCOUNTER — Other Ambulatory Visit: Payer: Self-pay | Admitting: Family Medicine

## 2020-05-12 ENCOUNTER — Encounter: Payer: Self-pay | Admitting: Family Medicine

## 2020-05-12 DIAGNOSIS — E034 Atrophy of thyroid (acquired): Secondary | ICD-10-CM

## 2020-05-14 ENCOUNTER — Telehealth (INDEPENDENT_AMBULATORY_CARE_PROVIDER_SITE_OTHER): Payer: No Typology Code available for payment source | Admitting: Cardiology

## 2020-05-14 ENCOUNTER — Other Ambulatory Visit: Payer: Self-pay

## 2020-05-14 ENCOUNTER — Inpatient Hospital Stay: Payer: No Typology Code available for payment source | Attending: Hematology

## 2020-05-14 ENCOUNTER — Encounter: Payer: Self-pay | Admitting: Cardiology

## 2020-05-14 VITALS — Ht 63.0 in | Wt 160.0 lb

## 2020-05-14 DIAGNOSIS — I5022 Chronic systolic (congestive) heart failure: Secondary | ICD-10-CM | POA: Diagnosis not present

## 2020-05-14 DIAGNOSIS — C50411 Malignant neoplasm of upper-outer quadrant of right female breast: Secondary | ICD-10-CM

## 2020-05-14 DIAGNOSIS — Z79899 Other long term (current) drug therapy: Secondary | ICD-10-CM

## 2020-05-14 DIAGNOSIS — E119 Type 2 diabetes mellitus without complications: Secondary | ICD-10-CM | POA: Diagnosis not present

## 2020-05-14 DIAGNOSIS — I420 Dilated cardiomyopathy: Secondary | ICD-10-CM

## 2020-05-14 DIAGNOSIS — Z170 Estrogen receptor positive status [ER+]: Secondary | ICD-10-CM

## 2020-05-14 DIAGNOSIS — D500 Iron deficiency anemia secondary to blood loss (chronic): Secondary | ICD-10-CM

## 2020-05-14 LAB — CBC WITH DIFFERENTIAL (CANCER CENTER ONLY)
Abs Immature Granulocytes: 0.02 10*3/uL (ref 0.00–0.07)
Basophils Absolute: 0.1 10*3/uL (ref 0.0–0.1)
Basophils Relative: 1 %
Eosinophils Absolute: 0.3 10*3/uL (ref 0.0–0.5)
Eosinophils Relative: 4 %
HCT: 42 % (ref 36.0–46.0)
Hemoglobin: 14 g/dL (ref 12.0–15.0)
Immature Granulocytes: 0 %
Lymphocytes Relative: 36 %
Lymphs Abs: 2.8 10*3/uL (ref 0.7–4.0)
MCH: 29.9 pg (ref 26.0–34.0)
MCHC: 33.3 g/dL (ref 30.0–36.0)
MCV: 89.7 fL (ref 80.0–100.0)
Monocytes Absolute: 0.8 10*3/uL (ref 0.1–1.0)
Monocytes Relative: 10 %
Neutro Abs: 3.8 10*3/uL (ref 1.7–7.7)
Neutrophils Relative %: 49 %
Platelet Count: 454 10*3/uL — ABNORMAL HIGH (ref 150–400)
RBC: 4.68 MIL/uL (ref 3.87–5.11)
RDW: 11.7 % (ref 11.5–15.5)
WBC Count: 7.8 10*3/uL (ref 4.0–10.5)
nRBC: 0 % (ref 0.0–0.2)

## 2020-05-14 LAB — IRON AND TIBC
Iron: 86 ug/dL (ref 41–142)
Saturation Ratios: 27 % (ref 21–57)
TIBC: 316 ug/dL (ref 236–444)
UIBC: 231 ug/dL (ref 120–384)

## 2020-05-14 LAB — RETICULOCYTES
Immature Retic Fract: 7 % (ref 2.3–15.9)
RBC.: 4.68 MIL/uL (ref 3.87–5.11)
Retic Count, Absolute: 63.6 10*3/uL (ref 19.0–186.0)
Retic Ct Pct: 1.4 % (ref 0.4–3.1)

## 2020-05-14 LAB — CMP (CANCER CENTER ONLY)
ALT: 12 U/L (ref 0–44)
AST: 13 U/L — ABNORMAL LOW (ref 15–41)
Albumin: 3.9 g/dL (ref 3.5–5.0)
Alkaline Phosphatase: 59 U/L (ref 38–126)
Anion gap: 8 (ref 5–15)
BUN: 5 mg/dL — ABNORMAL LOW (ref 6–20)
CO2: 23 mmol/L (ref 22–32)
Calcium: 9.3 mg/dL (ref 8.9–10.3)
Chloride: 109 mmol/L (ref 98–111)
Creatinine: 0.63 mg/dL (ref 0.44–1.00)
GFR, Estimated: >60 mL/min (ref >60)
Glucose, Bld: 116 mg/dL — ABNORMAL HIGH (ref 70–99)
Potassium: 4.1 mmol/L (ref 3.5–5.1)
Sodium: 140 mmol/L (ref 135–145)
Total Bilirubin: 0.8 mg/dL (ref 0.3–1.2)
Total Protein: 6.6 g/dL (ref 6.5–8.1)

## 2020-05-14 LAB — FERRITIN: Ferritin: 104 ng/mL (ref 11–307)

## 2020-05-14 MED ORDER — ENTRESTO 49-51 MG PO TABS: 1.0000 | ORAL_TABLET | Freq: Two times a day (BID) | ORAL | 3 refills | Status: DC

## 2020-05-14 NOTE — Patient Instructions (Signed)
Medication Instructions:  Your physician has recommended you make the following change in your medication:  Increase Entresto to 49/51 two times daily  *If you need a refill on your cardiac medications before your next appointment, please call your pharmacy*   Lab Work: Your physician recommends that you return for lab work in: 1 week for BMP   If you have labs (blood work) drawn today and your tests are completely normal, you will receive your results only by: Marland Kitchen MyChart Message (if you have MyChart) OR . A paper copy in the mail If you have any lab test that is abnormal or we need to change your treatment, we will call you to review the results.   Testing/Procedures: NONE   Follow-Up: At [facility], you and your health needs are our priority.  As part of our continuing mission to provide you with exceptional heart care, we have created designated Provider Care Teams.  These Care Teams include your primary Cardiologist (physician) and Advanced Practice Providers (APPs -  Physician Assistants and Nurse Practitioners) who all work together to provide you with the care you need, when you need it.  We recommend signing up for the patient portal called "MyChart".  Sign up information is provided on this After Visit Summary.  MyChart is used to connect with patients for Virtual Visits (Telemedicine).  Patients are able to view lab/test results, encounter notes, upcoming appointments, etc.  Non-urgent messages can be sent to your provider as well.   To learn more about what you can do with MyChart, go to [email or web address].    Your next appointment:   4 week(s)  The format for your next appointment:   In Person  Provider:   Jenne Campus, MD   Other Instructions When come in for [facility] will get EKG with nurse.

## 2020-05-14 NOTE — Progress Notes (Signed)
Virtual Visit via Video Note   This visit type was conducted due to national recommendations for restrictions regarding the COVID-19 Pandemic (e.g. social distancing) in an effort to limit this patient's exposure and mitigate transmission in our community.  Due to her co-morbid illnesses, this patient is at least at moderate risk for complications without adequate follow up.  This format is felt to be most appropriate for this patient at this time.  All issues noted in this document were discussed and addressed.  A limited physical exam was performed with this format.  Please refer to the patient's chart for her consent to telehealth for [facility].     Evaluation Performed:  Follow-up visit  This visit type was conducted due to national recommendations for restrictions regarding the COVID-19 Pandemic (e.g. social distancing).  This format is felt to be most appropriate for this patient at this time.  All issues noted in this document were discussed and addressed.  No physical exam was performed (except for noted visual exam findings with Video Visits).  Please refer to the patient's chart (MyChart message for video visits and phone note for telephone visits) for the patient's consent to telehealth for [facility].  Date:  05/14/2020  ID: Erin Fields, DOB [birth year], MRN [id number]   Patient Location: [place] [address]   Provider location:   [place] [facility]  PCP:  Copland, Gay Filler, MD  Cardiologist:  Jenne Campus, MD     Chief Complaint: I am doing well  History of Present Illness:    Erin Fields is a 55 y.o. female  who presents via audio/video conferencing for a telehealth visit today.  With cardiomyopathy moderately reduced left ventricle ejection fraction,.  Also history of hypertension dyslipidemia as well as diabetes mellitus.  Gradually try to put her on appropriate medication.  The purpose of this visit is to assess her  stability on overall she tolerates small dose of beta-blocker carvedilol 3.125 twice daily quite well as well as recent switch from [facility] to [facility] 24/26.  Denies have any chest pain tightness squeezing pressure burning chest.  She had no chance to check her blood pressure but last time when I seen her her blood pressure was slightly on the higher side so I think we can safely try to increase dose of Entresto.   The patient does not have symptoms concerning for COVID-19 infection (fever, chills, cough, or new SHORTNESS OF BREATH).    Prior CV studies:   The following studies were reviewed today:       Past Medical History:  Diagnosis Date  . Allergic rhinitis due to animal (cat) (dog) hair and dander 04/27/2020  . Allergic rhinitis due to pollen 04/27/2020  . Anxiety   . Anxiety and depression 01/20/2018  . Asthma   . Asthma, persistent controlled 10/05/2018  . B12 deficiency 11/27/2019  . Borderline diabetes 12/17/2015  . Cancer [facility]) 2019   right breast cancer  . CERVICAL CANCER, HX OF 12/06/2006   Qualifier: Diagnosis of  By: Arnoldo Morale MD, Balinda Quails   . Chronic sinusitis 01/26/2015  . Colon polyps   . Controlled type 2 diabetes mellitus without complication, without long-term current use of insulin ([place]) 05/28/2017  . Crohn disease ([place]) 11/29/2016  . Depression   . Diabetes mellitus without complication ([place])    type 2  . Dyspnea 09/15/2014  . Elevated triglycerides with high cholesterol 11/26/2013  . Environmental allergies   . Eustachian  tube dysfunction 12/04/2014  . Ex-smoker 11/18/2015  . Family history of breast cancer   . Genetic testing 07/16/2017   Negative genetic testing on the multi-cancer panel.  The Multi-Gene Panel offered by [name] includes sequencing and/or deletion duplication testing of the following 83 genes: ALK, APC, ATM, AXIN2,BAP1,  BARD1, BLM, BMPR1A, BRCA1, BRCA2, BRIP1, CASR, CDC73, CDH1, CDK4, CDKN1B, CDKN1C, CDKN2A (p14ARF), CDKN2A (p16INK4a), CEBPA, CHEK2,  CTNNA1, DICER1, DIS3L2, EGFR (c.2369C>T, p.Thr790Met variant onl  . GERD (gastroesophageal reflux disease)   . HEADACHE 12/06/2006   Qualifier: Diagnosis of  By: Arnoldo Morale MD, Balinda Quails   . History of adenomatous polyp of colon 11/19/2017  . Hyperlipidemia   . Hypertension   . Hypothyroidism   . IDA (iron deficiency anemia) 11/06/2019  . Impaired fasting glucose 12/12/2017  . Insomnia 08/20/2013  . Irritable bowel syndrome 11/29/2006   Qualifier: Diagnosis of  By: Arnoldo Morale MD, John E   . Left sided abdominal pain 12/13/2017  . Liver hemangioma 08/24/2014  . Malignant neoplasm of upper-outer quadrant of right breast in female, estrogen receptor positive ([place]) 03/20/2017  . Migraines   . Mild persistent asthma, uncomplicated 0/26/3785  . Nasal polyp, unspecified 01/10/2018  . Oral herpes 04/20/2013  . Other allergic rhinitis 09/15/2014  . Personal history of radiation therapy 2019   34 radiation tx  . PONV (postoperative nausea and vomiting)    scopolamine patch helps  . Recurrent infections 10/05/2018  . Right upper lobe pulmonary nodule 01/10/2018  . Symptomatic varicose veins, right 09/02/2015  . Ulcerative colitis ([place])   . Varicose veins     Past Surgical History:  Procedure Laterality Date  . BREAST BIOPSY Right 02/2017  . BREAST BIOPSY Right 11/07/2019  . BREAST LUMPECTOMY Right 04/24/2017  . BREAST LUMPECTOMY WITH RADIOACTIVE SEED AND SENTINEL LYMPH NODE BIOPSY Right 04/24/2017   Procedure: RIGHT BREAST LUMPECTOMY WITH RADIOACTIVE SEED AND SENTINEL LYMPH NODE BIOPSY;  Surgeon: [name], MD;  Location: [facility];  Service: General;  Laterality: Right;  . crapal tunnel relase Left 05/2018  . ENDOMETRIAL ABLATION  2010   Had abnormal uterine bleeding (heavy and frequent), performed in [place], menses stopped completely 2 years after  . EXPLORATORY LAPAROTOMY     Had exploratory surgery at the age of 20 for abdominal pain with cyst found on her left ovary, not treated  surgically  . FOOT SURGERY Right 2012  . HAND SURGERY Right   . RE-EXCISION OF BREAST LUMPECTOMY Right 05/15/2017   Procedure: RE-EXCISION OF BREAST LUMPECTOMY;  Surgeon: [name], MD;  Location: [facility];  Service: General;  Laterality: Right;  . ROBOTIC ASSISTED SALPINGO OOPHERECTOMY Bilateral 08/29/2019   Procedure: XI ROBOTIC ASSISTED SALPINGO OOPHORECTOMY;  Surgeon: [name], MD;  Location: [facility];  Service: Gynecology;  Laterality: Bilateral;  . Septoplasty with turbinate reduction    . TONSILLECTOMY  1980  . VEIN SURGERY     Removal Right Leg  . WISDOM TOOTH EXTRACTION       Current Meds  Medication Sig  . acyclovir (ZOVIRAX) 400 MG tablet TAKE 1 TABLET (400 MG TOTAL) BY MOUTH 3 (THREE) TIMES DAILY AS NEEDED. (Patient taking differently: Take 400 mg by mouth 3 (three) times daily as needed (cold sores).)  . Adalimumab 80 MG/0.8ML PNKT Inject 80 mg into the skin every 14 (fourteen) days.  Marland Kitchen ascorbic acid (VITAMIN C) 500 MG tablet Take 500 mg by mouth daily.  Marland Kitchen aspirin EC 81 MG tablet Take  1 tablet (81 mg total) by mouth daily. Swallow whole.  Marland Kitchen azelastine (ASTELIN) 0.1 % nasal spray Place 2 sprays into both nostrils 2 (two) times daily.  . budesonide-formoterol (SYMBICORT) 160-4.5 MCG/ACT inhaler Inhale 2 puffs into the lungs 2 (two) times daily.  . carvedilol (COREG) 3.125 MG tablet Take 1 tablet (3.125 mg total) by mouth 2 (two) times daily.  . cholecalciferol (VITAMIN D3) 25 MCG (1000 UNIT) tablet Take 1,000 Units by mouth daily.  . diphenoxylate-atropine (LOMOTIL) 2.5-0.025 MG tablet Take 1 tablet by mouth 4 (four) times daily as needed for diarrhea or loose stools.  Marland Kitchen EPINEPHrine 0.3 mg/0.3 mL IJ SOAJ injection Inject 0.3 mg into the muscle as needed for anaphylaxis.  Marland Kitchen exemestane (AROMASIN) 25 MG tablet TAKE 1 TABLET (25 MG TOTAL) BY MOUTH DAILY AFTER BREAKFAST.  Marland Kitchen Hyoscyamine Sulfate SL 0.125 MG SUBL Take 0.125 mg by mouth  every 6 (six) hours as needed (cramping).  . lansoprazole (PREVACID) 30 MG capsule Take 30 mg by mouth daily.  Marland Kitchen levalbuterol (XOPENEX HFA) 45 MCG/ACT inhaler Inhale 2 puffs into the lungs every 4 (four) hours as needed for wheezing (Q4-6 Hours PRN).  Marland Kitchen levothyroxine (SYNTHROID) 88 MCG tablet Take 1 tablet (88 mcg total) by mouth daily before breakfast.  . meloxicam (MOBIC) 15 MG tablet Take 15 mg by mouth daily.  . mesalamine (LIALDA) 1.2 g EC tablet Take 2.4 g by mouth in the morning and at bedtime.   . metFORMIN (GLUCOPHAGE) 500 MG tablet Take 1 tablet (500 mg total) by mouth 2 (two) times daily with a meal.  . montelukast (SINGULAIR) 10 MG tablet Take 10 mg by mouth at bedtime.   . promethazine (PHENERGAN) 25 MG tablet Take 0.5-1 tablets (12.5-25 mg total) by mouth daily as needed for nausea or vomiting.  . rosuvastatin (CRESTOR) 20 MG tablet Take 1 tablet (20 mg total) by mouth daily. (Patient taking differently: Take 20 mg by mouth at bedtime.)  . sacubitril-valsartan (ENTRESTO) 49-51 MG Take 1 tablet by mouth 2 (two) times daily.  Marland Kitchen triamcinolone (NASACORT) 55 MCG/ACT AERO nasal inhaler Place 1 spray into the nose daily.  Marland Kitchen venlafaxine XR (EFFEXOR-XR) 150 MG 24 hr capsule Take 1 capsule (150 mg total) by mouth daily with breakfast.  . zinc gluconate 50 MG tablet Take 50 mg by mouth daily.  Marland Kitchen zolpidem (AMBIEN) 5 MG tablet Take 1 tablet (5 mg total) by mouth at bedtime. TAKE 1 TABLET BY MOUTH AT BEDTIME AS NEEDED FOR SLEEP (Patient taking differently: Take 5 mg by mouth at bedtime.)  . [DISCONTINUED] sacubitril-valsartan (ENTRESTO) 24-26 MG Take 1 tablet by mouth 2 (two) times daily. ENTRESTO FREE MONTH CARD Doreen Salvage [id number] Juanna Cao [id number] RXPCN [facility]  ID [id number]      Family History: The patient's family history includes Arthritis (age of onset: 59) in her mother; Brain cancer in her father; Breast cancer in her mother; Colitis in her sister; Crohn's disease in her mother; Diabetes in her  maternal grandfather and mother; Heart attack in her maternal grandfather; Heart disease in her maternal grandfather and mother; Hyperlipidemia in her mother; Hypertension in her mother; Leukemia in her maternal grandfather; Leukemia (age of onset: 83) in her cousin; Lung cancer in her father and mother.   ROS:   Please see the history of present illness.     All other systems reviewed and are negative.   Labs/Other Tests and Data Reviewed:     Recent Labs: 06/18/2019: Magnesium 1.8; TSH 1.28 05/14/2020: ALT 12;  BUN 5; Creatinine 0.63; Hemoglobin 14.0; Platelet Count 454; Potassium 4.1; Sodium 140  Recent Lipid Panel    Component Value Date/Time   CHOL 153 06/18/2019 0952   TRIG 159.0 (H) 06/18/2019 0952   HDL 37.50 (L) 06/18/2019 0952   CHOLHDL 4 06/18/2019 0952   VLDL 31.8 06/18/2019 0952   LDLCALC 83 06/18/2019 0952   LDLDIRECT 101.0 12/02/2018 0955      Exam:    Vital Signs:  Ht _0  (1.6 m)   Wt 160 lb (72.6 kg)   BMI 28.34 kg/m     Wt Readings from Last 3 Encounters:  05/14/20 160 lb (72.6 kg)  04/29/20 159 lb (72.1 kg)  04/01/20 166 lb (75.3 kg)     Well nourished, well developed in no acute distress. Alert awake and x3 with talking of the video link she looks good and she feels well.  Denies have any signs and symptoms of decompensation congestive heart failure.  Diagnosis for this visit:   1. Dilated cardiomyopathy ([place])   2. Medication management   3. Chronic systolic congestive heart failure ([place])   4. Diabetes mellitus without complication ([place])   5. Malignant neoplasm of upper-outer quadrant of right breast in female, estrogen receptor positive ([place])      ASSESSMENT & PLAN:    1.  Dilated cardiomyopathy today I will double the dose of Entresto I did review her blood test done by her oncologist today which showed normal potassium normal kidney function.  Therefore we can safely increase 2. Entresto to 48/52 twice daily.  We will continue present dose  of beta-blocker however I bring her to our office next week at the end of the week to do EKG if EKG showed normal P interval and she feels well will double the dose of carvedilol.  I will also check a Chem-7.  Plan will be also to see her back in about 3 weeks to discuss the issue of cardiac catheterization. Systolic congestive heart failure she appears to be compensated on evaluation today. 3.  Diabetes followed by antimedicine team. 4.  Malignant breast cancer doing well from that point review.  COVID-19 Education: The signs and symptoms of COVID-19 were discussed with the patient and how to seek care for testing (follow up with PCP or arrange E-visit).  The importance of social distancing was discussed today.  Patient Risk:   After full review of this patients clinical status, I feel that they are at least moderate risk at this time.  Time:   Today, I have spent 15 minutes with the patient with telehealth technology discussing pt health issues.  I spent 20 minutes reviewing her chart before the visit.  Visit was finished at 3:45 PM.    Medication Adjustments/Labs and Tests Ordered: Current medicines are reviewed at length with the patient today.  Concerns regarding medicines are outlined above.  Orders Placed This Encounter  Procedures  . Basic metabolic panel   Medication changes:  Meds ordered this encounter  Medications  . sacubitril-valsartan (ENTRESTO) 49-51 MG    Sig: Take 1 tablet by mouth 2 (two) times daily.    Dispense:  60 tablet    Refill:  3     Disposition: Follow-up 3 weeks  Signed, Park Liter, MD, [facility] 05/14/2020 4:27 PM    [facility]

## 2020-05-21 ENCOUNTER — Other Ambulatory Visit: Payer: Self-pay

## 2020-05-21 ENCOUNTER — Ambulatory Visit (INDEPENDENT_AMBULATORY_CARE_PROVIDER_SITE_OTHER): Payer: No Typology Code available for payment source | Admitting: Cardiology

## 2020-05-21 DIAGNOSIS — I420 Dilated cardiomyopathy: Secondary | ICD-10-CM | POA: Diagnosis not present

## 2020-05-21 DIAGNOSIS — E119 Type 2 diabetes mellitus without complications: Secondary | ICD-10-CM

## 2020-05-21 DIAGNOSIS — I5022 Chronic systolic (congestive) heart failure: Secondary | ICD-10-CM | POA: Diagnosis not present

## 2020-05-21 DIAGNOSIS — C50411 Malignant neoplasm of upper-outer quadrant of right female breast: Secondary | ICD-10-CM

## 2020-05-21 DIAGNOSIS — I10 Essential (primary) hypertension: Secondary | ICD-10-CM

## 2020-05-21 DIAGNOSIS — Z170 Estrogen receptor positive status [ER+]: Secondary | ICD-10-CM

## 2020-05-22 LAB — BASIC METABOLIC PANEL
BUN/Creatinine Ratio: 8 — ABNORMAL LOW (ref 9–23)
BUN: 5 mg/dL — ABNORMAL LOW (ref 6–24)
CO2: 22 mmol/L (ref 20–29)
Calcium: 9.8 mg/dL (ref 8.7–10.2)
Chloride: 105 mmol/L (ref 96–106)
Creatinine, Ser: 0.59 mg/dL (ref 0.57–1.00)
GFR calc Af Amer: 118 mL/min/{1.73_m2} (ref >59)
GFR calc non Af Amer: 102 mL/min/{1.73_m2} (ref >59)
Glucose: 113 mg/dL — ABNORMAL HIGH (ref 65–99)
Potassium: 4.1 mmol/L (ref 3.5–5.2)
Sodium: 142 mmol/L (ref 134–144)

## 2020-05-24 ENCOUNTER — Encounter: Payer: Self-pay | Admitting: Family Medicine

## 2020-05-24 MED ORDER — HYDROCODONE-ACETAMINOPHEN 5-325 MG PO TABS
1.0000 | ORAL_TABLET | Freq: Three times a day (TID) | ORAL | 0 refills | Status: DC | PRN
Start: 2020-05-24 — End: 2020-07-16

## 2020-05-24 NOTE — Addendum Note (Signed)
Addended by: Lamar Blinks C on: 05/24/2020 04:01 PM   Modules accepted: Orders

## 2020-05-26 ENCOUNTER — Telehealth: Payer: Self-pay | Admitting: Cardiology

## 2020-05-26 NOTE — Telephone Encounter (Signed)
Called patient informed her of results.

## 2020-05-26 NOTE — Telephone Encounter (Signed)
Patient returning call for lab results.

## 2020-06-11 ENCOUNTER — Other Ambulatory Visit: Payer: Self-pay | Admitting: Family Medicine

## 2020-06-11 DIAGNOSIS — F5104 Psychophysiologic insomnia: Secondary | ICD-10-CM

## 2020-06-15 ENCOUNTER — Ambulatory Visit (HOSPITAL_BASED_OUTPATIENT_CLINIC_OR_DEPARTMENT_OTHER)
Admission: RE | Admit: 2020-06-15 | Discharge: 2020-06-15 | Disposition: A | Discharge status: Home / Self Care | Payer: No Typology Code available for payment source | Source: Ambulatory Visit | Attending: Cardiology | Admitting: Cardiology

## 2020-06-15 ENCOUNTER — Ambulatory Visit: Payer: No Typology Code available for payment source | Admitting: Cardiology

## 2020-06-15 ENCOUNTER — Other Ambulatory Visit: Payer: Self-pay

## 2020-06-15 ENCOUNTER — Encounter: Payer: Self-pay | Admitting: Cardiology

## 2020-06-15 VITALS — BP 130/80 | HR 81 | Ht 63.0 in | Wt 159.0 lb

## 2020-06-15 DIAGNOSIS — C50411 Malignant neoplasm of upper-outer quadrant of right female breast: Secondary | ICD-10-CM

## 2020-06-15 DIAGNOSIS — I420 Dilated cardiomyopathy: Secondary | ICD-10-CM | POA: Diagnosis present

## 2020-06-15 DIAGNOSIS — Z170 Estrogen receptor positive status [ER+]: Secondary | ICD-10-CM

## 2020-06-15 DIAGNOSIS — I5022 Chronic systolic (congestive) heart failure: Secondary | ICD-10-CM | POA: Diagnosis present

## 2020-06-15 DIAGNOSIS — E119 Type 2 diabetes mellitus without complications: Secondary | ICD-10-CM | POA: Insufficient documentation

## 2020-06-15 MED ORDER — HYDROCODONE-ACETAMINOPHEN 5-325 MG PO TABS: 1.0000 | ORAL_TABLET | Freq: Four times a day (QID) | ORAL | 0 refills | Status: DC | PRN

## 2020-06-15 NOTE — H&P (View-Only) (Signed)
Cardiology Office Note:    Date:  06/15/2020   [name], DOB [birth year], MRN [id number]  PCP:  Darreld Mclean, MD  Cardiologist:  Jenne Campus, MD    Referring MD: Darreld Mclean, MD   Chief Complaint  Patient presents with  . Follow-up  I am doing better  History of Present Illness:    Erin Fields is a 55 y.o. female with past medical history significant for diabetes, hypertension, breast cancer status post lumpectomy, in remission, she was getting ready to have hip surgery, she had of having echocardiogram done and surprisingly she was discovered to have cardiomyopathy with ejection fraction of 25%.  She was referred to me for evaluation and treatment.  We initiated her with Delene Loll and now she is on 4951 mg twice daily, she is also on carvedilol small dose only 3.125 twice daily.  And she is coming today to talk about options for evaluation for etiology of her cardiomyopathy.  She does have risk factors for coronary artery disease therefore coronary artery disease must be ruled out.  We talked about options in the situation option being coronary CT angio versus cardiac catheterization.  She would like to have definite test therefore we will opt for cardiac catheterization. Overall clinically she is doing well.  She has much less shortness of breath but ability to exercise is limited because severe pain in the right hip.  She does not have proximal nocturnal dyspnea, she does not have any swelling of lower extremities.  Overall she seems to be improving quite well with current therapy.  Past Medical History:  Diagnosis Date  . Allergic rhinitis due to animal (cat) (dog) hair and dander 04/27/2020  . Allergic rhinitis due to pollen 04/27/2020  . Anxiety   . Anxiety and depression 01/20/2018  . Asthma   . Asthma, persistent controlled 10/05/2018  . B12 deficiency 11/27/2019  . Borderline diabetes 12/17/2015  . Cancer [facility]) 2019   right breast cancer  . CERVICAL  CANCER, HX OF 12/06/2006   Qualifier: Diagnosis of  By: Arnoldo Morale MD, Balinda Quails   . Chronic sinusitis 01/26/2015  . Chronic venous insufficiency 09/02/2015  . Colon polyps   . Congestive heart failure ([place]) 04/29/2020  . Controlled type 2 diabetes mellitus without complication, without long-term current use of insulin ([place]) 05/28/2017  . Crohn disease ([place]) 11/29/2016  . Depression   . Diabetes mellitus without complication ([place])    type 2  . Dilated cardiomyopathy ([place]) ejection fraction 25% by echocardiogram from January 2022 04/29/2020  . Dyspnea 09/15/2014  . Elevated triglycerides with high cholesterol 11/26/2013  . Environmental allergies   . Eustachian tube dysfunction 12/04/2014  . Ex-smoker 11/18/2015  . Family history of breast cancer   . Genetic testing 07/16/2017   Negative genetic testing on the multi-cancer panel.  The Multi-Gene Panel offered by [name] includes sequencing and/or deletion duplication testing of the following 83 genes: ALK, APC, ATM, AXIN2,BAP1,  BARD1, BLM, BMPR1A, BRCA1, BRCA2, BRIP1, CASR, CDC73, CDH1, CDK4, CDKN1B, CDKN1C, CDKN2A (p14ARF), CDKN2A (p16INK4a), CEBPA, CHEK2, CTNNA1, DICER1, DIS3L2, EGFR (c.2369C>T, p.Thr790Met variant onl  . GERD (gastroesophageal reflux disease)   . HEADACHE 12/06/2006   Qualifier: Diagnosis of  By: Arnoldo Morale MD, Balinda Quails   . History of adenomatous polyp of colon 11/19/2017  . Hyperlipidemia   . Hypertension   . Hypothyroidism   . IDA (iron deficiency anemia) 11/06/2019  . Impaired fasting glucose 12/12/2017  . Insomnia 08/20/2013  . Irritable  bowel syndrome 11/29/2006   Qualifier: Diagnosis of  By: Arnoldo Morale MD, John E   . Left sided abdominal pain 12/13/2017  . Liver hemangioma 08/24/2014  . Malignant neoplasm of upper-outer quadrant of right breast in female, estrogen receptor positive ([place]) 03/20/2017  . Migraines   . Mild persistent asthma, uncomplicated 9/93/7169  . Nasal polyp, unspecified 01/10/2018  . Oral herpes 04/20/2013  . Other allergic  rhinitis 09/15/2014  . Personal history of radiation therapy 2019   36 radiation tx  . PONV (postoperative nausea and vomiting)    scopolamine patch helps  . Recurrent infections 10/05/2018  . Right upper lobe pulmonary nodule 01/10/2018  . Symptomatic varicose veins, right 09/02/2015  . Ulcerative colitis ([place])   . Varicose veins     Past Surgical History:  Procedure Laterality Date  . BREAST BIOPSY Right 02/2017  . BREAST BIOPSY Right 11/07/2019  . BREAST LUMPECTOMY Right 04/24/2017  . BREAST LUMPECTOMY WITH RADIOACTIVE SEED AND SENTINEL LYMPH NODE BIOPSY Right 04/24/2017   Procedure: RIGHT BREAST LUMPECTOMY WITH RADIOACTIVE SEED AND SENTINEL LYMPH NODE BIOPSY;  Surgeon: [name], MD;  Location: [place];  Service: General;  Laterality: Right;  . crapal tunnel relase Left 05/2018  . ENDOMETRIAL ABLATION  2010   Had abnormal uterine bleeding (heavy and frequent), performed in [place], menses stopped completely 2 years after  . EXPLORATORY LAPAROTOMY     Had exploratory surgery at the age of 89 for abdominal pain with cyst found on her left ovary, not treated surgically  . FOOT SURGERY Right 2012  . HAND SURGERY Right   . RE-EXCISION OF BREAST LUMPECTOMY Right 05/15/2017   Procedure: RE-EXCISION OF BREAST LUMPECTOMY;  Surgeon: [name], MD;  Location: [place];  Service: General;  Laterality: Right;  . ROBOTIC ASSISTED SALPINGO OOPHERECTOMY Bilateral 08/29/2019   Procedure: XI ROBOTIC ASSISTED SALPINGO OOPHORECTOMY;  Surgeon: [name], MD;  Location[facility];  Service: Gynecology;  Laterality: Bilateral;  . Septoplasty with turbinate reduction    . TONSILLECTOMY  1980  . VEIN SURGERY     Removal Right Leg  . WISDOM TOOTH EXTRACTION      Current Medications: Current Meds  Medication Sig  . acyclovir (ZOVIRAX) 400 MG tablet TAKE 1 TABLET (400 MG TOTAL) BY MOUTH 3 (THREE) TIMES DAILY AS NEEDED. (Patient taking  differently: Take 400 mg by mouth 3 (three) times daily as needed (cold sores).)  . Adalimumab 80 MG/0.8ML PNKT Inject 80 mg into the skin every 14 (fourteen) days.  Marland Kitchen ascorbic acid (VITAMIN C) 500 MG tablet Take 500 mg by mouth daily.  Marland Kitchen aspirin EC 81 MG tablet Take 1 tablet (81 mg total) by mouth daily. Swallow whole.  Marland Kitchen azelastine (ASTELIN) 0.1 % nasal spray Place 2 sprays into both nostrils 2 (two) times daily.  . budesonide-formoterol (SYMBICORT) 160-4.5 MCG/ACT inhaler Inhale 2 puffs into the lungs 2 (two) times daily.  . carvedilol (COREG) 3.125 MG tablet Take 1 tablet (3.125 mg total) by mouth 2 (two) times daily.  . cholecalciferol (VITAMIN D3) 25 MCG (1000 UNIT) tablet Take 1,000 Units by mouth daily.  . diphenoxylate-atropine (LOMOTIL) 2.5-0.025 MG tablet Take 1 tablet by mouth 4 (four) times daily as needed for diarrhea or loose stools.  Marland Kitchen EPINEPHrine 0.3 mg/0.3 mL IJ SOAJ injection Inject 0.3 mg into the muscle as needed for anaphylaxis.  Marland Kitchen exemestane (AROMASIN) 25 MG tablet TAKE 1 TABLET (25 MG TOTAL) BY MOUTH DAILY AFTER BREAKFAST.  Marland Kitchen  lansoprazole (PREVACID) 30 MG capsule Take 30 mg by mouth daily.  Marland Kitchen levalbuterol (XOPENEX HFA) 45 MCG/ACT inhaler Inhale 2 puffs into the lungs every 4 (four) hours as needed for wheezing (Q4-6 Hours PRN).  Marland Kitchen levothyroxine (SYNTHROID) 88 MCG tablet Take 1 tablet (88 mcg total) by mouth daily before breakfast.  . meloxicam (MOBIC) 15 MG tablet Take 15 mg by mouth daily.  . mesalamine (LIALDA) 1.2 g EC tablet Take 2.4 g by mouth in the morning and at bedtime.   . metFORMIN (GLUCOPHAGE) 500 MG tablet Take 1 tablet (500 mg total) by mouth 2 (two) times daily with a meal.  . montelukast (SINGULAIR) 10 MG tablet Take 10 mg by mouth at bedtime.   . promethazine (PHENERGAN) 25 MG tablet Take 0.5-1 tablets (12.5-25 mg total) by mouth daily as needed for nausea or vomiting.  . rosuvastatin (CRESTOR) 20 MG tablet Take 1 tablet (20 mg total) by mouth daily.  (Patient taking differently: Take 20 mg by mouth at bedtime.)  . sacubitril-valsartan (ENTRESTO) 49-51 MG Take 1 tablet by mouth 2 (two) times daily.  Marland Kitchen triamcinolone (NASACORT) 55 MCG/ACT AERO nasal inhaler Place 1 spray into the nose daily.  Marland Kitchen venlafaxine XR (EFFEXOR-XR) 150 MG 24 hr capsule Take 1 capsule (150 mg total) by mouth daily with breakfast.  . zinc gluconate 50 MG tablet Take 50 mg by mouth daily.  Marland Kitchen zolpidem (AMBIEN) 5 MG tablet TAKE 1 TABLET BY MOUTH AT BEDTIME AS NEEDED FOR SLEEP     Allergies:   Losartan potassium, Ace inhibitors, Aspirin, Cefaclor, Oxycodone, Sulfa antibiotics, Lisinopril, and Other   Social History   Socioeconomic History  . Marital status: Widowed    Spouse name: Not on file  . Number of children: Not on file  . Years of education: Not on file  . Highest education level: Not on file  Occupational History  . Not on file  Tobacco Use  . Smoking status: Former Smoker    Packs/day: 3.00    Years: 15.00    Pack years: 45.00    Types: Cigarettes    Quit date: 09/14/1993    Years since quitting: 26.7  . Smokeless tobacco: Never Used  . Tobacco comment: Quit 26 yrs ago  Vaping Use  . Vaping Use: Never used  Substance and Sexual Activity  . Alcohol use: Yes    Comment: Drinks a couple every 2-3 months  . Drug use: No  . Sexual activity: Not Currently    Comment: ablation  Other Topics Concern  . Not on file  Social History Narrative  . Not on file   Social Determinants of Health   Financial Resource Strain: Not on file  Food Insecurity: Not on file  Transportation Needs: Not on file  Physical Activity: Not on file  Stress: Not on file  Social Connections: Not on file     Family History: The patient's family history includes Arthritis (age of onset: 73) in her mother; Brain cancer in her father; Breast cancer in her mother; Colitis in her sister; Crohn's disease in her mother; Diabetes in her maternal grandfather and mother; Heart attack  in her maternal grandfather; Heart disease in her maternal grandfather and mother; Hyperlipidemia in her mother; Hypertension in her mother; Leukemia in her maternal grandfather; Leukemia (age of onset: 53) in her cousin; Lung cancer in her father and mother. ROS:   Please see the history of present illness.    All 14 point review of systems negative except  as described per history of present illness  EKGs/Labs/Other Studies Reviewed:      Recent Labs: 06/18/2019: Magnesium 1.8; TSH 1.28 05/14/2020: ALT 12; Hemoglobin 14.0; Platelet Count 454 05/21/2020: BUN 5; Creatinine, Ser 0.59; Potassium 4.1; Sodium 142  Recent Lipid Panel    Component Value Date/Time   CHOL 153 06/18/2019 0952   TRIG 159.0 (H) 06/18/2019 0952   HDL 37.50 (L) 06/18/2019 0952   CHOLHDL 4 06/18/2019 0952   VLDL 31.8 06/18/2019 0952   LDLCALC 83 06/18/2019 0952   LDLDIRECT 101.0 12/02/2018 0955    Physical Exam:    VS:  BP 130/80 (BP Location: Left Arm, Patient Position: Sitting)   Pulse 81   Ht _0  (1.6 m)   Wt 159 lb (72.1 kg)   SpO2 97%   BMI 28.17 kg/m     Wt Readings from Last 3 Encounters:  06/15/20 159 lb (72.1 kg)  05/14/20 160 lb (72.6 kg)  04/29/20 159 lb (72.1 kg)     GEN:  Well nourished, well developed in no acute distress HEENT: Normal NECK: No JVD; No carotid bruits LYMPHATICS: No lymphadenopathy CARDIAC: RRR, no murmurs, no rubs, no gallops RESPIRATORY:  Clear to auscultation without rales, wheezing or rhonchi  ABDOMEN: Soft, non-tender, non-distended MUSCULOSKELETAL:  No edema; No deformity  SKIN: Warm and dry LOWER EXTREMITIES: no swelling NEUROLOGIC:  Alert and oriented x 3 PSYCHIATRIC:  Normal affect   ASSESSMENT:    1. Dilated cardiomyopathy (HCC) ejection fraction 25% by echocardiogram from January 2022   2. Chronic systolic congestive heart failure ([place])   3. Controlled type 2 diabetes mellitus without complication, without long-term current use of insulin ([place])   4.  Malignant neoplasm of upper-outer quadrant of right breast in female, estrogen receptor positive ([place])    PLAN:    In order of problems listed above:  1. Cardiomyopathy with ejection fraction 25% by echo from January 2022.  Gradual anticoagulant right medication.  Will do EKG today if EKG is acceptable double the dose of carvedilol.  Also I will check Chem-7 if Chem-7 is acceptable we will add Aldactone to her medical regimen.  In the future we will consider adding Jardiance.  In terms of answering the question about etiology of this phenomenon she will be scheduled to have a cardiac catheterization.  Cardiac catheterization has been explained to her including all risk benefits as well as alternatives.  She understands and she is willing to proceed. 2. Chronic systolic congestive heart failure [place] Heart Association probably class II difficult to judge because her ability to exercise is limited because of pain in her hip.  Overall however she does not have any swelling of lower extremities no proximal nocturnal dyspnea does have some shortness of breath with exertion. 3. Type 2 diabetes that being followed by internal medicine team, her last hemoglobin A1c was 6.3 this is from April 01, 2020 monitor good control diabetes. 4. History of malignant breast cancer status post lumpectomy.  Stable in remission.   Medication Adjustments/Labs and Tests Ordered: Current medicines are reviewed at length with the patient today.  Concerns regarding medicines are outlined above.  No orders of the defined types were placed in this encounter.  Medication changes: No orders of the defined types were placed in this encounter.   Signed, Park Liter, MD, [facility] 06/15/2020 10:21 AM    [place]

## 2020-06-15 NOTE — Patient Instructions (Signed)
Medication Instructions:  Your physician recommends that you continue on your current medications as directed. Please refer to the Current Medication list given to you today.  *If you need a refill on your cardiac medications before your next appointment, please call your pharmacy*   Lab Work: Your physician recommends that you return for lab work today: bmp, cbc   If you have labs (blood work) drawn today and your tests are completely normal, you will receive your results only by: Marland Kitchen MyChart Message (if you have MyChart) OR . A paper copy in the mail If you have any lab test that is abnormal or we need to change your treatment, we will call you to review the results.   Testing/Procedures: A chest x-ray takes a picture of the organs and structures inside the chest, including the heart, lungs, and blood vessels. This test can show several things, including, whether the heart is enlarges; whether fluid is building up in the lungs; and whether pacemaker / defibrillator leads are still in place.     [place], [place] [address] Dept: [phone] Loc: [phone]  Erin Fields  06/15/2020  You are scheduled for a Cardiac Catheterization on Monday, March 7 with Dr. Harrell Gave Fields.  1. Please arrive at the [facility] (Main Entrance A) at [facility]: [place], [place] [address] at 5:30 AM (This time is two hours before your procedure to ensure your preparation). Free valet parking service is available.   Special note: Every effort is made to have your procedure done on time. Please understand that emergencies sometimes delay scheduled procedures.  2. Diet: Do not eat solid foods after midnight.  The patient may have clear liquids until 5am upon the day of the procedure.  3. Labs: You will have labs drawn today.  4. Medication instructions in  preparation for your procedure:   Contrast Allergy: No     Do not take Diabetes Med Glucophage (Metformin) on the day of the procedure and HOLD 48 HOURS AFTER THE PROCEDURE.  On the morning of your procedure, take your Aspirin and any morning medicines NOT listed above.  You may use sips of water.  5. Plan for one night stay--bring personal belongings. 6. Bring a current list of your medications and current insurance cards. 7. You MUST have a responsible person to drive you home. 8. Someone MUST be with you the first 24 hours after you arrive home or your discharge will be delayed. 9. Please wear clothes that are easy to get on and off and wear slip-on shoes.  Thank you for allowing [place] to care for you!   -- [name]    Follow-Up: At [facility], you and your health needs are our priority.  As part of our continuing mission to provide you with exceptional heart care, we have created designated Provider Care Teams.  These Care Teams include your primary Cardiologist (physician) and Advanced Practice Providers (APPs -  Physician Assistants and Nurse Practitioners) who all work together to provide you with the care you need, when you need it.  We recommend signing up for the patient portal called "MyChart".  Sign up information is provided on this After Visit Summary.  MyChart is used to connect with patients for Virtual Visits (Telemedicine).  Patients are able to view lab/test results, encounter notes, upcoming appointments, etc.  Non-urgent messages can  be sent to your provider as well.   To learn more about what you can do with MyChart, go to [email or web address].    Your next appointment:   1 month(s)  The format for your next appointment:   In Person  Provider:   Jenne Campus, MD   Other Instructions   Coronary Angiogram With Stent Coronary angiogram with stent placement is a procedure to widen or open a narrow blood vessel of  the heart (coronary artery). Arteries may become blocked by cholesterol buildup (plaques) in the lining of the artery wall. When a coronary artery becomes partially blocked, blood flow to that area decreases. This may lead to chest pain or a heart attack (myocardial infarction). A stent is a small piece of metal that looks like mesh or spring. Stent placement may be done as treatment after a heart attack, or to prevent a heart attack if a blocked artery is found by a coronary angiogram. Let your health care provider know about:  Any allergies you have, including allergies to medicines or contrast dye.  All medicines you are taking, including vitamins, herbs, eye drops, creams, and over-the-counter medicines.  Any problems you or family members have had with anesthetic medicines.  Any blood disorders you have.  Any surgeries you have had.  Any medical conditions you have, including kidney problems or kidney failure.  Whether you are pregnant or may be pregnant.  Whether you are breastfeeding. What are the risks? Generally, this is a safe procedure. However, serious problems may occur, including:  Damage to nearby structures or organs, such as the heart, blood vessels, or kidneys.  A return of blockage.  Bleeding, infection, or bruising at the insertion site.  A collection of blood under the skin (hematoma) at the insertion site.  A blood clot in another part of the body.  Allergic reaction to medicines or dyes.  Bleeding into the abdomen (retroperitoneal bleeding).  Stroke (rare).  Heart attack (rare). What happens before the procedure? Staying hydrated Follow instructions from your health care provider about hydration, which may include:  Up to 2 hours before the procedure - you may continue to drink clear liquids, such as water, clear fruit juice, black coffee, and plain tea.   Eating and drinking restrictions Follow instructions from your health care provider about  eating and drinking, which may include:  8 hours before the procedure - stop eating heavy meals or foods, such as meat, fried foods, or fatty foods.  6 hours before the procedure - stop eating light meals or foods, such as toast or cereal.  2 hours before the procedure - stop drinking clear liquids. Medicines Ask your health care provider about:  Changing or stopping your regular medicines. This is especially important if you are taking diabetes medicines or blood thinners.  Taking medicines such as aspirin and ibuprofen. These medicines can thin your blood. Do not take these medicines unless your health care provider tells you to take them. ? Generally, aspirin is recommended before a thin tube, called a catheter, is passed through a blood vessel and inserted into the heart (cardiac catheterization).  Taking over-the-counter medicines, vitamins, herbs, and supplements. General instructions  Do not use any products that contain nicotine or tobacco for at least 4 weeks before the procedure. These products include cigarettes, e-cigarettes, and chewing tobacco. If you need help quitting, ask your health care provider.  Plan to have someone take you home from the hospital or clinic.  If you will be going  home right after the procedure, plan to have someone with you for 24 hours.  You may have tests and imaging procedures.  Ask your health care provider: ? How your insertion site will be marked. Ask which artery will be used for the procedure. ? What steps will be taken to help prevent infection. These may include:  Removing hair at the insertion site.  Washing skin with a germ-killing soap.  Taking antibiotic medicine. What happens during the procedure?  An IV will be inserted into one of your veins.  Electrodes may be placed on your chest to monitor your heart rate during the procedure.  You will be given one or more of the following: ? A medicine to help you relax  (sedative). ? A medicine to numb the area (local anesthetic) for catheter insertion.  A small incision will be made for catheter insertion.  The catheter will be inserted into an artery using a guide wire. The location may be in your groin, your wrist, or the fold of your arm (near your elbow).  An X-ray procedure (fluoroscopy) will be used to help guide the catheter to the opening of the heart arteries.  A dye will be injected into the catheter. X-rays will be taken. The dye helps to show where any narrowing or blockages are located in the arteries.  Tell your health care provider if you have chest pain or trouble breathing.  A tiny wire will be guided to the blocked spot, and a balloon will be inflated to make the artery wider.  The stent will be expanded to crush the plaques into the wall of the vessel. The stent will hold the area open and improve the blood flow. Most stents have a drug coating to reduce the risk of the stent narrowing over time.  The artery may be made wider using a drill, laser, or other tools that remove plaques.  The catheter will be removed when the blood flow improves. The stent will stay where it was placed, and the lining of the artery will grow over it.  A bandage (dressing) will be placed on the insertion site. Pressure will be applied to stop bleeding.  The IV will be removed. This procedure may vary among health care providers and hospitals.   What happens after the procedure?  Your blood pressure, heart rate, breathing rate, and blood oxygen level will be monitored until you leave the hospital or clinic.  If the procedure is done through the leg, you will lie flat in bed for a few hours or for as long as told by your health care provider. You will be instructed not to bend or cross your legs.  The insertion site and the pulse in your foot or wrist will be checked often.  You may have more blood tests, X-rays, and a test that records the electrical  activity of your heart (electrocardiogram, or ECG).  Do not drive for 24 hours if you were given a sedative during your procedure. Summary  Coronary angiogram with stent placement is a procedure to widen or open a narrowed coronary artery. This is done to treat heart problems.  Before the procedure, let your health care provider know about all the medical conditions and surgeries you have or have had.  This is a safe procedure. However, some problems may occur, including damage to nearby structures or organs, bleeding, blood clots, or allergies.  Follow your health care provider's instructions about eating, drinking, medicines, and other lifestyle changes, such  as quitting tobacco use before the procedure. This information is not intended to replace advice given to you by your health care provider. Make sure you discuss any questions you have with your health care provider. Document Revised: 10/23/2018 Document Reviewed: 10/23/2018 Elsevier Patient Education  [place].

## 2020-06-15 NOTE — Progress Notes (Signed)
Cardiology Office Note:    Date:  06/15/2020   ID:  Erin Fields, DOB [birth year], MRN [id number]  PCP:  Darreld Mclean, MD  Cardiologist:  Jenne Campus, MD    Referring MD: Darreld Mclean, MD   Chief Complaint  Patient presents with  . Follow-up  I am doing better  History of Present Illness:    Erin Fields is a 55 y.o. female with past medical history significant for diabetes, hypertension, breast cancer status post lumpectomy, in remission, she was getting ready to have hip surgery, she had of having echocardiogram done and surprisingly she was discovered to have cardiomyopathy with ejection fraction of 25%.  She was referred to me for evaluation and treatment.  We initiated her with Delene Loll and now she is on 4951 mg twice daily, she is also on carvedilol small dose only 3.125 twice daily.  And she is coming today to talk about options for evaluation for etiology of her cardiomyopathy.  She does have risk factors for coronary artery disease therefore coronary artery disease must be ruled out.  We talked about options in the situation option being coronary CT angio versus cardiac catheterization.  She would like to have definite test therefore we will opt for cardiac catheterization. Overall clinically she is doing well.  She has much less shortness of breath but ability to exercise is limited because severe pain in the right hip.  She does not have proximal nocturnal dyspnea, she does not have any swelling of lower extremities.  Overall she seems to be improving quite well with current therapy.  Past Medical History:  Diagnosis Date  . Allergic rhinitis due to animal (cat) (dog) hair and dander 04/27/2020  . Allergic rhinitis due to pollen 04/27/2020  . Anxiety   . Anxiety and depression 01/20/2018  . Asthma   . Asthma, persistent controlled 10/05/2018  . B12 deficiency 11/27/2019  . Borderline diabetes 12/17/2015  . Cancer [facility]) 2019   right breast cancer  . CERVICAL  CANCER, HX OF 12/06/2006   Qualifier: Diagnosis of  By: Arnoldo Morale MD, Balinda Quails   . Chronic sinusitis 01/26/2015  . Chronic venous insufficiency 09/02/2015  . Colon polyps   . Congestive heart failure ([place]) 04/29/2020  . Controlled type 2 diabetes mellitus without complication, without long-term current use of insulin ([place]) 05/28/2017  . Crohn disease ([place]) 11/29/2016  . Depression   . Diabetes mellitus without complication ([place])    type 2  . Dilated cardiomyopathy ([place]) ejection fraction 25% by echocardiogram from January 2022 04/29/2020  . Dyspnea 09/15/2014  . Elevated triglycerides with high cholesterol 11/26/2013  . Environmental allergies   . Eustachian tube dysfunction 12/04/2014  . Ex-smoker 11/18/2015  . Family history of breast cancer   . Genetic testing 07/16/2017   Negative genetic testing on the multi-cancer panel.  The Multi-Gene Panel offered by [name] includes sequencing and/or deletion duplication testing of the following 83 genes: ALK, APC, ATM, AXIN2,BAP1,  BARD1, BLM, BMPR1A, BRCA1, BRCA2, BRIP1, CASR, CDC73, CDH1, CDK4, CDKN1B, CDKN1C, CDKN2A (p14ARF), CDKN2A (p16INK4a), CEBPA, CHEK2, CTNNA1, DICER1, DIS3L2, EGFR (c.2369C>T, p.Thr790Met variant onl  . GERD (gastroesophageal reflux disease)   . HEADACHE 12/06/2006   Qualifier: Diagnosis of  By: Arnoldo Morale MD, Balinda Quails   . History of adenomatous polyp of colon 11/19/2017  . Hyperlipidemia   . Hypertension   . Hypothyroidism   . IDA (iron deficiency anemia) 11/06/2019  . Impaired fasting glucose 12/12/2017  . Insomnia 08/20/2013  . Irritable  bowel syndrome 11/29/2006   Qualifier: Diagnosis of  By: Arnoldo Morale MD, John E   . Left sided abdominal pain 12/13/2017  . Liver hemangioma 08/24/2014  . Malignant neoplasm of upper-outer quadrant of right breast in female, estrogen receptor positive ([place]) 03/20/2017  . Migraines   . Mild persistent asthma, uncomplicated 9/93/7169  . Nasal polyp, unspecified 01/10/2018  . Oral herpes 04/20/2013  . Other allergic  rhinitis 09/15/2014  . Personal history of radiation therapy 2019   36 radiation tx  . PONV (postoperative nausea and vomiting)    scopolamine patch helps  . Recurrent infections 10/05/2018  . Right upper lobe pulmonary nodule 01/10/2018  . Symptomatic varicose veins, right 09/02/2015  . Ulcerative colitis ([place])   . Varicose veins     Past Surgical History:  Procedure Laterality Date  . BREAST BIOPSY Right 02/2017  . BREAST BIOPSY Right 11/07/2019  . BREAST LUMPECTOMY Right 04/24/2017  . BREAST LUMPECTOMY WITH RADIOACTIVE SEED AND SENTINEL LYMPH NODE BIOPSY Right 04/24/2017   Procedure: RIGHT BREAST LUMPECTOMY WITH RADIOACTIVE SEED AND SENTINEL LYMPH NODE BIOPSY;  Surgeon: [name], MD;  Location: [place];  Service: General;  Laterality: Right;  . crapal tunnel relase Left 05/2018  . ENDOMETRIAL ABLATION  2010   Had abnormal uterine bleeding (heavy and frequent), performed in [place], menses stopped completely 2 years after  . EXPLORATORY LAPAROTOMY     Had exploratory surgery at the age of 89 for abdominal pain with cyst found on her left ovary, not treated surgically  . FOOT SURGERY Right 2012  . HAND SURGERY Right   . RE-EXCISION OF BREAST LUMPECTOMY Right 05/15/2017   Procedure: RE-EXCISION OF BREAST LUMPECTOMY;  Surgeon: [name], MD;  Location: [place];  Service: General;  Laterality: Right;  . ROBOTIC ASSISTED SALPINGO OOPHERECTOMY Bilateral 08/29/2019   Procedure: XI ROBOTIC ASSISTED SALPINGO OOPHORECTOMY;  Surgeon: [name], MD;  Location[facility];  Service: Gynecology;  Laterality: Bilateral;  . Septoplasty with turbinate reduction    . TONSILLECTOMY  1980  . VEIN SURGERY     Removal Right Leg  . WISDOM TOOTH EXTRACTION      Current Medications: Current Meds  Medication Sig  . acyclovir (ZOVIRAX) 400 MG tablet TAKE 1 TABLET (400 MG TOTAL) BY MOUTH 3 (THREE) TIMES DAILY AS NEEDED. (Patient taking  differently: Take 400 mg by mouth 3 (three) times daily as needed (cold sores).)  . Adalimumab 80 MG/0.8ML PNKT Inject 80 mg into the skin every 14 (fourteen) days.  Marland Kitchen ascorbic acid (VITAMIN C) 500 MG tablet Take 500 mg by mouth daily.  Marland Kitchen aspirin EC 81 MG tablet Take 1 tablet (81 mg total) by mouth daily. Swallow whole.  Marland Kitchen azelastine (ASTELIN) 0.1 % nasal spray Place 2 sprays into both nostrils 2 (two) times daily.  . budesonide-formoterol (SYMBICORT) 160-4.5 MCG/ACT inhaler Inhale 2 puffs into the lungs 2 (two) times daily.  . carvedilol (COREG) 3.125 MG tablet Take 1 tablet (3.125 mg total) by mouth 2 (two) times daily.  . cholecalciferol (VITAMIN D3) 25 MCG (1000 UNIT) tablet Take 1,000 Units by mouth daily.  . diphenoxylate-atropine (LOMOTIL) 2.5-0.025 MG tablet Take 1 tablet by mouth 4 (four) times daily as needed for diarrhea or loose stools.  Marland Kitchen EPINEPHrine 0.3 mg/0.3 mL IJ SOAJ injection Inject 0.3 mg into the muscle as needed for anaphylaxis.  Marland Kitchen exemestane (AROMASIN) 25 MG tablet TAKE 1 TABLET (25 MG TOTAL) BY MOUTH DAILY AFTER BREAKFAST.  Marland Kitchen  lansoprazole (PREVACID) 30 MG capsule Take 30 mg by mouth daily.  Marland Kitchen levalbuterol (XOPENEX HFA) 45 MCG/ACT inhaler Inhale 2 puffs into the lungs every 4 (four) hours as needed for wheezing (Q4-6 Hours PRN).  Marland Kitchen levothyroxine (SYNTHROID) 88 MCG tablet Take 1 tablet (88 mcg total) by mouth daily before breakfast.  . meloxicam (MOBIC) 15 MG tablet Take 15 mg by mouth daily.  . mesalamine (LIALDA) 1.2 g EC tablet Take 2.4 g by mouth in the morning and at bedtime.   . metFORMIN (GLUCOPHAGE) 500 MG tablet Take 1 tablet (500 mg total) by mouth 2 (two) times daily with a meal.  . montelukast (SINGULAIR) 10 MG tablet Take 10 mg by mouth at bedtime.   . promethazine (PHENERGAN) 25 MG tablet Take 0.5-1 tablets (12.5-25 mg total) by mouth daily as needed for nausea or vomiting.  . rosuvastatin (CRESTOR) 20 MG tablet Take 1 tablet (20 mg total) by mouth daily.  (Patient taking differently: Take 20 mg by mouth at bedtime.)  . sacubitril-valsartan (ENTRESTO) 49-51 MG Take 1 tablet by mouth 2 (two) times daily.  Marland Kitchen triamcinolone (NASACORT) 55 MCG/ACT AERO nasal inhaler Place 1 spray into the nose daily.  Marland Kitchen venlafaxine XR (EFFEXOR-XR) 150 MG 24 hr capsule Take 1 capsule (150 mg total) by mouth daily with breakfast.  . zinc gluconate 50 MG tablet Take 50 mg by mouth daily.  Marland Kitchen zolpidem (AMBIEN) 5 MG tablet TAKE 1 TABLET BY MOUTH AT BEDTIME AS NEEDED FOR SLEEP     Allergies:   Losartan potassium, Ace inhibitors, Aspirin, Cefaclor, Oxycodone, Sulfa antibiotics, Lisinopril, and Other   Social History   Socioeconomic History  . Marital status: Widowed    Spouse name: Not on file  . Number of children: Not on file  . Years of education: Not on file  . Highest education level: Not on file  Occupational History  . Not on file  Tobacco Use  . Smoking status: Former Smoker    Packs/day: 3.00    Years: 15.00    Pack years: 45.00    Types: Cigarettes    Quit date: 09/14/1993    Years since quitting: 26.7  . Smokeless tobacco: Never Used  . Tobacco comment: Quit 26 yrs ago  Vaping Use  . Vaping Use: Never used  Substance and Sexual Activity  . Alcohol use: Yes    Comment: Drinks a couple every 2-3 months  . Drug use: No  . Sexual activity: Not Currently    Comment: ablation  Other Topics Concern  . Not on file  Social History Narrative  . Not on file   Social Determinants of Health   Financial Resource Strain: Not on file  Food Insecurity: Not on file  Transportation Needs: Not on file  Physical Activity: Not on file  Stress: Not on file  Social Connections: Not on file     Family History: The patient's family history includes Arthritis (age of onset: 73) in her mother; Brain cancer in her father; Breast cancer in her mother; Colitis in her sister; Crohn's disease in her mother; Diabetes in her maternal grandfather and mother; Heart attack  in her maternal grandfather; Heart disease in her maternal grandfather and mother; Hyperlipidemia in her mother; Hypertension in her mother; Leukemia in her maternal grandfather; Leukemia (age of onset: 53) in her cousin; Lung cancer in her father and mother. ROS:   Please see the history of present illness.    All 14 point review of systems negative except  as described per history of present illness  EKGs/Labs/Other Studies Reviewed:      Recent Labs: 06/18/2019: Magnesium 1.8; TSH 1.28 05/14/2020: ALT 12; Hemoglobin 14.0; Platelet Count 454 05/21/2020: BUN 5; Creatinine, Ser 0.59; Potassium 4.1; Sodium 142  Recent Lipid Panel    Component Value Date/Time   CHOL 153 06/18/2019 0952   TRIG 159.0 (H) 06/18/2019 0952   HDL 37.50 (L) 06/18/2019 0952   CHOLHDL 4 06/18/2019 0952   VLDL 31.8 06/18/2019 0952   LDLCALC 83 06/18/2019 0952   LDLDIRECT 101.0 12/02/2018 0955    Physical Exam:    VS:  BP 130/80 (BP Location: Left Arm, Patient Position: Sitting)   Pulse 81   Ht _0  (1.6 m)   Wt 159 lb (72.1 kg)   SpO2 97%   BMI 28.17 kg/m     Wt Readings from Last 3 Encounters:  06/15/20 159 lb (72.1 kg)  05/14/20 160 lb (72.6 kg)  04/29/20 159 lb (72.1 kg)     GEN:  Well nourished, well developed in no acute distress HEENT: Normal NECK: No JVD; No carotid bruits LYMPHATICS: No lymphadenopathy CARDIAC: RRR, no murmurs, no rubs, no gallops RESPIRATORY:  Clear to auscultation without rales, wheezing or rhonchi  ABDOMEN: Soft, non-tender, non-distended MUSCULOSKELETAL:  No edema; No deformity  SKIN: Warm and dry LOWER EXTREMITIES: no swelling NEUROLOGIC:  Alert and oriented x 3 PSYCHIATRIC:  Normal affect   ASSESSMENT:    1. Dilated cardiomyopathy (HCC) ejection fraction 25% by echocardiogram from January 2022   2. Chronic systolic congestive heart failure ([place])   3. Controlled type 2 diabetes mellitus without complication, without long-term current use of insulin ([place])   4.  Malignant neoplasm of upper-outer quadrant of right breast in female, estrogen receptor positive ([place])    PLAN:    In order of problems listed above:  1. Cardiomyopathy with ejection fraction 25% by echo from January 2022.  Gradual anticoagulant right medication.  Will do EKG today if EKG is acceptable double the dose of carvedilol.  Also I will check Chem-7 if Chem-7 is acceptable we will add Aldactone to her medical regimen.  In the future we will consider adding Jardiance.  In terms of answering the question about etiology of this phenomenon she will be scheduled to have a cardiac catheterization.  Cardiac catheterization has been explained to her including all risk benefits as well as alternatives.  She understands and she is willing to proceed. 2. Chronic systolic congestive heart failure New [place] Heart Association probably class II difficult to judge because her ability to exercise is limited because of pain in her hip.  Overall however she does not have any swelling of lower extremities no proximal nocturnal dyspnea does have some shortness of breath with exertion. 3. Type 2 diabetes that being followed by internal medicine team, her last hemoglobin A1c was 6.3 this is from April 01, 2020 monitor good control diabetes. 4. History of malignant breast cancer status post lumpectomy.  Stable in remission.   Medication Adjustments/Labs and Tests Ordered: Current medicines are reviewed at length with the patient today.  Concerns regarding medicines are outlined above.  No orders of the defined types were placed in this encounter.  Medication changes: No orders of the defined types were placed in this encounter.   Signed, Park Liter, MD, [facility] 06/15/2020 10:21 AM    [place]

## 2020-06-15 NOTE — Addendum Note (Signed)
Addended by: Lamar Blinks C on: 06/15/2020 01:55 PM   Modules accepted: Orders

## 2020-06-16 LAB — BASIC METABOLIC PANEL
BUN/Creatinine Ratio: 9 (ref 9–23)
BUN: 5 mg/dL — ABNORMAL LOW (ref 6–24)
CO2: 22 mmol/L (ref 20–29)
Calcium: 9.9 mg/dL (ref 8.7–10.2)
Chloride: 100 mmol/L (ref 96–106)
Creatinine, Ser: 0.54 mg/dL — ABNORMAL LOW (ref 0.57–1.00)
Glucose: 107 mg/dL — ABNORMAL HIGH (ref 65–99)
Potassium: 4.8 mmol/L (ref 3.5–5.2)
Sodium: 142 mmol/L (ref 134–144)
eGFR: 107 mL/min/{1.73_m2} (ref >59)

## 2020-06-16 LAB — CBC
Hematocrit: 41.7 % (ref 34.0–46.6)
Hemoglobin: 13.9 g/dL (ref 11.1–15.9)
MCH: 29.1 pg (ref 26.6–33.0)
MCHC: 33.3 g/dL (ref 31.5–35.7)
MCV: 87 fL (ref 79–97)
Platelets: 442 10*3/uL (ref 150–450)
RBC: 4.77 x10E6/uL (ref 3.77–5.28)
RDW: 11.8 % (ref 11.7–15.4)
WBC: 8.8 10*3/uL (ref 3.4–10.8)

## 2020-06-17 ENCOUNTER — Telehealth: Payer: Self-pay | Admitting: *Deleted

## 2020-06-17 NOTE — Telephone Encounter (Addendum)
Pt contacted pre-catheterization scheduled at [facility] for: Monday June 21, 2020 7:30 AM Verified arrival time and place: [facility]) at: 5:30 AM   No solid food after midnight prior to cath, clear liquids until 5 AM day of procedure.  Hold: Metformin-day of procedure and 48 hours post procedure  Except hold medications AM meds can be  taken pre-cath with sips of water including: ASA 81 mg   Confirmed patient has responsible adult to drive home post procedure and be with patient first 24 hours after arriving home: yes  You are allowed ONE visitor in the waiting room during the time you are at the hospital for your procedure. Both you and your visitor must wear a mask once you enter the hospital.   COVID-19 positive 04/13/20, pt denies any new symptoms concerning for COVID-19.

## 2020-06-17 NOTE — Telephone Encounter (Signed)
Reviewed procedure/mask/visitor instructions with patient. 

## 2020-06-17 NOTE — Telephone Encounter (Signed)
Follow Up:     Pt is returning you call from today.

## 2020-06-18 ENCOUNTER — Other Ambulatory Visit (HOSPITAL_COMMUNITY): Payer: No Typology Code available for payment source

## 2020-06-21 ENCOUNTER — Other Ambulatory Visit: Payer: Self-pay

## 2020-06-21 ENCOUNTER — Ambulatory Visit (HOSPITAL_COMMUNITY)
Admission: RE | Admit: 2020-06-21 | Discharge: 2020-06-21 | Disposition: A | Discharge status: Home / Self Care | Payer: No Typology Code available for payment source | Attending: Internal Medicine | Admitting: Internal Medicine

## 2020-06-21 ENCOUNTER — Encounter (HOSPITAL_COMMUNITY): Payer: Self-pay | Admitting: Internal Medicine

## 2020-06-21 ENCOUNTER — Encounter (HOSPITAL_COMMUNITY)
Admission: RE | Disposition: A | Discharge status: Home / Self Care | Payer: Self-pay | Source: Home / Self Care | Attending: Internal Medicine

## 2020-06-21 DIAGNOSIS — Z885 Allergy status to narcotic agent status: Secondary | ICD-10-CM | POA: Insufficient documentation

## 2020-06-21 DIAGNOSIS — Z7982 Long term (current) use of aspirin: Secondary | ICD-10-CM | POA: Diagnosis not present

## 2020-06-21 DIAGNOSIS — I2510 Atherosclerotic heart disease of native coronary artery without angina pectoris: Secondary | ICD-10-CM | POA: Diagnosis not present

## 2020-06-21 DIAGNOSIS — Z888 Allergy status to other drugs, medicaments and biological substances status: Secondary | ICD-10-CM | POA: Diagnosis not present

## 2020-06-21 DIAGNOSIS — Z79890 Hormone replacement therapy (postmenopausal): Secondary | ICD-10-CM | POA: Insufficient documentation

## 2020-06-21 DIAGNOSIS — Z886 Allergy status to analgesic agent status: Secondary | ICD-10-CM | POA: Insufficient documentation

## 2020-06-21 DIAGNOSIS — Z79899 Other long term (current) drug therapy: Secondary | ICD-10-CM | POA: Diagnosis not present

## 2020-06-21 DIAGNOSIS — I5022 Chronic systolic (congestive) heart failure: Secondary | ICD-10-CM | POA: Insufficient documentation

## 2020-06-21 DIAGNOSIS — I428 Other cardiomyopathies: Secondary | ICD-10-CM | POA: Insufficient documentation

## 2020-06-21 DIAGNOSIS — Z882 Allergy status to sulfonamides status: Secondary | ICD-10-CM | POA: Insufficient documentation

## 2020-06-21 DIAGNOSIS — Z853 Personal history of malignant neoplasm of breast: Secondary | ICD-10-CM | POA: Diagnosis not present

## 2020-06-21 DIAGNOSIS — I110 Hypertensive heart disease with heart failure: Secondary | ICD-10-CM | POA: Diagnosis present

## 2020-06-21 DIAGNOSIS — Z7984 Long term (current) use of oral hypoglycemic drugs: Secondary | ICD-10-CM | POA: Diagnosis not present

## 2020-06-21 DIAGNOSIS — Z87891 Personal history of nicotine dependence: Secondary | ICD-10-CM | POA: Diagnosis not present

## 2020-06-21 DIAGNOSIS — E785 Hyperlipidemia, unspecified: Secondary | ICD-10-CM | POA: Diagnosis not present

## 2020-06-21 DIAGNOSIS — E119 Type 2 diabetes mellitus without complications: Secondary | ICD-10-CM | POA: Insufficient documentation

## 2020-06-21 DIAGNOSIS — I509 Heart failure, unspecified: Secondary | ICD-10-CM

## 2020-06-21 HISTORY — PX: RIGHT/LEFT HEART CATH AND CORONARY ANGIOGRAPHY: CATH118266

## 2020-06-21 LAB — GLUCOSE, CAPILLARY: Glucose-Capillary: 123 mg/dL — ABNORMAL HIGH (ref 70–99)

## 2020-06-21 SURGERY — RIGHT/LEFT HEART CATH AND CORONARY ANGIOGRAPHY
Anesthesia: LOCAL

## 2020-06-21 MED ORDER — MIDAZOLAM HCL 2 MG/2ML IJ SOLN
INTRAMUSCULAR | Status: DC | PRN
  Administered 2020-06-21 (×2): 1 mg via INTRAVENOUS

## 2020-06-21 MED ORDER — SODIUM CHLORIDE 0.9% FLUSH: 3.0000 mL | INTRAVENOUS | Status: DC | PRN

## 2020-06-21 MED ORDER — FENTANYL CITRATE (PF) 100 MCG/2ML IJ SOLN
INTRAMUSCULAR | Status: AC
  Filled 2020-06-21: qty 2

## 2020-06-21 MED ORDER — METFORMIN HCL 500 MG PO TABS: 500.0000 mg | ORAL_TABLET | Freq: Two times a day (BID) | ORAL | 3 refills | Status: DC

## 2020-06-21 MED ORDER — ACETAMINOPHEN 325 MG PO TABS: 650.0000 mg | ORAL_TABLET | ORAL | Status: DC | PRN

## 2020-06-21 MED ORDER — SODIUM CHLORIDE 0.9% FLUSH: 3.0000 mL | Freq: Two times a day (BID) | INTRAVENOUS | Status: DC

## 2020-06-21 MED ORDER — FENTANYL CITRATE (PF) 100 MCG/2ML IJ SOLN
INTRAMUSCULAR | Status: DC | PRN
  Administered 2020-06-21 (×2): 25 ug via INTRAVENOUS

## 2020-06-21 MED ORDER — SODIUM CHLORIDE 0.9 % IV SOLN: 250.0000 mL | INTRAVENOUS | Status: DC | PRN

## 2020-06-21 MED ORDER — HEPARIN (PORCINE) IN NACL 1000-0.9 UT/500ML-% IV SOLN
INTRAVENOUS | Status: AC
  Filled 2020-06-21: qty 1000

## 2020-06-21 MED ORDER — HEPARIN (PORCINE) IN NACL 1000-0.9 UT/500ML-% IV SOLN
INTRAVENOUS | Status: DC | PRN
  Administered 2020-06-21 (×2): 500 mL

## 2020-06-21 MED ORDER — HEPARIN SODIUM (PORCINE) 1000 UNIT/ML IJ SOLN
INTRAMUSCULAR | Status: AC
  Filled 2020-06-21: qty 1

## 2020-06-21 MED ORDER — SODIUM CHLORIDE 0.9 % IV SOLN: INTRAVENOUS | Status: DC

## 2020-06-21 MED ORDER — VERAPAMIL HCL 2.5 MG/ML IV SOLN
INTRAVENOUS | Status: DC | PRN
  Administered 2020-06-21: 10 mL via INTRA_ARTERIAL

## 2020-06-21 MED ORDER — VERAPAMIL HCL 2.5 MG/ML IV SOLN
INTRAVENOUS | Status: AC
  Filled 2020-06-21: qty 2

## 2020-06-21 MED ORDER — IOHEXOL 350 MG/ML SOLN
INTRAVENOUS | Status: DC | PRN
  Administered 2020-06-21: 90 mL via INTRA_ARTERIAL

## 2020-06-21 MED ORDER — HYDRALAZINE HCL 20 MG/ML IJ SOLN: 10.0000 mg | INTRAMUSCULAR | Status: DC | PRN

## 2020-06-21 MED ORDER — ONDANSETRON HCL 4 MG/2ML IJ SOLN: 4.0000 mg | Freq: Four times a day (QID) | INTRAMUSCULAR | Status: DC | PRN

## 2020-06-21 MED ORDER — LABETALOL HCL 5 MG/ML IV SOLN: 10.0000 mg | INTRAVENOUS | Status: DC | PRN

## 2020-06-21 MED ORDER — ASPIRIN 81 MG PO CHEW: 81.0000 mg | CHEWABLE_TABLET | ORAL | Status: DC

## 2020-06-21 MED ORDER — HEPARIN SODIUM (PORCINE) 1000 UNIT/ML IJ SOLN
INTRAMUSCULAR | Status: DC | PRN
  Administered 2020-06-21: 3500 [IU] via INTRAVENOUS

## 2020-06-21 MED ORDER — MIDAZOLAM HCL 2 MG/2ML IJ SOLN
INTRAMUSCULAR | Status: AC
  Filled 2020-06-21: qty 2

## 2020-06-21 MED ORDER — LIDOCAINE HCL (PF) 1 % IJ SOLN
INTRAMUSCULAR | Status: AC
  Filled 2020-06-21: qty 30

## 2020-06-21 MED ORDER — LIDOCAINE HCL (PF) 1 % IJ SOLN
INTRAMUSCULAR | Status: DC | PRN
  Administered 2020-06-21: 2 mL via SUBCUTANEOUS

## 2020-06-21 SURGICAL SUPPLY — 14 items
CATH 5FR JL3.5 JR4 ANG PIG MP (CATHETERS) ×1 IMPLANT
CATH BALLN WEDGE 5F 110CM (CATHETERS) ×1 IMPLANT
CATH LAUNCHER 5F EBU3.0 (CATHETERS) IMPLANT
CATHETER LAUNCHER 5F EBU3.0 (CATHETERS) ×2
DEVICE RAD COMP TR BAND LRG (VASCULAR PRODUCTS) ×1 IMPLANT
GLIDESHEATH SLEND SS 6F .021 (SHEATH) ×1 IMPLANT
GUIDEWIRE INQWIRE 1.5J.035X260 (WIRE) ×1 IMPLANT
INQWIRE 1.5J .035X260CM (WIRE) ×2
KIT HEART LEFT (KITS) ×2 IMPLANT
PACK CARDIAC CATHETERIZATION (CUSTOM PROCEDURE TRAY) ×2 IMPLANT
SHEATH GLIDE SLENDER 4/5FR (SHEATH) ×1 IMPLANT
SYR MEDRAD MARK 7 150ML (SYRINGE) ×2 IMPLANT
TRANSDUCER W/STOPCOCK (MISCELLANEOUS) ×2 IMPLANT
TUBING CIL FLEX 10 FLL-RA (TUBING) ×2 IMPLANT

## 2020-06-21 NOTE — Discharge Instructions (Signed)
Radial Site Care  This sheet gives you information about how to care for yourself after your procedure. Your health care provider may also give you more specific instructions. If you have problems or questions, contact your health care provider. What can I expect after the procedure? After the procedure, it is common to have:  Bruising and tenderness at the catheter insertion area. Follow these instructions at home: Medicines  Take over-the-counter and prescription medicines only as told by your health care provider. Insertion site care  Follow instructions from your health care provider about how to take care of your insertion site. Make sure you: ? Wash your hands with soap and water before you change your bandage (dressing). If soap and water are not available, use hand sanitizer. ? Change your dressing as told by your health care provider. ? Leave stitches (sutures), skin glue, or adhesive strips in place. These skin closures may need to stay in place for 2 weeks or longer. If adhesive strip edges start to loosen and curl up, you may trim the loose edges. Do not remove adhesive strips completely unless your health care provider tells you to do that.  Check your insertion site every day for signs of infection. Check for: ? Redness, swelling, or pain. ? Fluid or blood. ? Pus or a bad smell. ? Warmth.  Do not take baths, swim, or use a hot tub until your health care provider approves.  You may shower 24-48 hours after the procedure, or as directed by your health care provider. ? Remove the dressing and gently wash the site with plain soap and water. ? Pat the area dry with a clean towel. ? Do not rub the site. That could cause bleeding.  Do not apply powder or lotion to the site. Activity  For 24 hours after the procedure, or as directed by your health care provider: ? Do not flex or bend the affected arm. ? Do not push or pull heavy objects with the affected arm. ? Do not drive  yourself home from the hospital or clinic. You may drive 24 hours after the procedure unless your health care provider tells you not to. ? Do not operate machinery or power tools.  Do not lift anything that is heavier than 10 lb (4.5 kg), or the limit that you are told, until your health care provider says that it is safe.  Ask your health care provider when it is okay to: ? Return to work or school. ? Resume usual physical activities or sports. ? Resume sexual activity.   General instructions  If the catheter site starts to bleed, raise your arm and put firm pressure on the site. If the bleeding does not stop, get help right away. This is a medical emergency.  If you went home on the same day as your procedure, a responsible adult should be with you for the first 24 hours after you arrive home.  Keep all follow-up visits as told by your health care provider. This is important. Contact a health care provider if:  You have a fever.  You have redness, swelling, or yellow drainage around your insertion site. Get help right away if:  You have unusual pain at the radial site.  The catheter insertion area swells very fast.  The insertion area is bleeding, and the bleeding does not stop when you hold steady pressure on the area.  Your arm or hand becomes pale, cool, tingly, or numb. These symptoms may represent a serious   problem that is an emergency. Do not wait to see if the symptoms will go away. Get medical help right away. Call your local emergency services (911 in the [place] Do not drive yourself to the hospital. Summary  After the procedure, it is common to have bruising and tenderness at the site.  Follow instructions from your health care provider about how to take care of your radial site wound. Check the wound every day for signs of infection.  Do not lift anything that is heavier than 10 lb (4.5 kg), or the limit that you are told, until your health care provider says that it  is safe. This information is not intended to replace advice given to you by your health care provider. Make sure you discuss any questions you have with your health care provider. Document Revised: 05/09/2017 Document Reviewed: 05/09/2017 Elsevier Patient Education  2021 Elsevier Inc.  

## 2020-06-21 NOTE — Research (Signed)
[place] Informed Consent   Subject Name: [facility]  Subject met inclusion and exclusion criteria.  The informed consent form, study requirements and expectations were reviewed with the subject and questions and concerns were addressed prior to the signing of the consent form.  The subject verbalized understanding of the trail requirements.  The subject agreed to participate in the [facility] trial and signed the informed consent.  The informed consent was obtained prior to performance of any protocol-specific procedures for the subject.  A copy of the signed informed consent was given to the subject and a copy was placed in the subject's medical record.  Philemon Kingdom D 06/21/2020, 8166 AM

## 2020-06-21 NOTE — Progress Notes (Signed)
Discharge instructions reviewed with patient and family. Verbalized understanding. 

## 2020-06-21 NOTE — Interval H&P Note (Signed)
History and Physical Interval Note:  06/21/2020 7:09 AM  Erin Fields  has presented today for surgery, with the diagnosis of cardiomyopathy and HFrEF.  The various methods of treatment have been discussed with the patient and family. After consideration of risks, benefits and other options for treatment, the patient has consented to  Procedure(s): RIGHT/LEFT HEART CATH AND CORONARY ANGIOGRAPHY (N/A) as a surgical intervention.  The patient's history has been reviewed, patient examined, no change in status, stable for surgery.  I have reviewed the patient's chart and labs.  Questions were answered to the patient's satisfaction.    Cath Lab Visit (complete for each Cath Lab visit)  Clinical Evaluation Leading to the Procedure:   ACS: No.  Non-ACS:    Anginal/Heart Failure Classification: NYHA class III  Anti-ischemic medical therapy: Minimal Therapy (1 class of medications)  Non-Invasive Test Results: No non-invasive testing performed (LVEF 25% by echo -> high risk)  Prior CABG: No previous CABG  Erin Fields

## 2020-06-22 LAB — POCT I-STAT 7, (LYTES, BLD GAS, ICA,H+H)
Acid-Base Excess: 3 mmol/L — ABNORMAL HIGH (ref 0.0–2.0)
Bicarbonate: 26.6 mmol/L (ref 20.0–28.0)
Calcium, Ion: 1.25 mmol/L (ref 1.15–1.40)
HCT: 37 % (ref 36.0–46.0)
Hemoglobin: 12.6 g/dL (ref 12.0–15.0)
O2 Saturation: 100 %
Potassium: 4.1 mmol/L (ref 3.5–5.1)
Sodium: 143 mmol/L (ref 135–145)
TCO2: 28 mmol/L (ref 22–32)
pCO2 arterial: 38 mmHg (ref 32.0–48.0)
pH, Arterial: 7.452 — ABNORMAL HIGH (ref 7.350–7.450)
pO2, Arterial: 159 mmHg — ABNORMAL HIGH (ref 83.0–108.0)

## 2020-06-30 ENCOUNTER — Telehealth: Payer: Self-pay

## 2020-06-30 DIAGNOSIS — I420 Dilated cardiomyopathy: Secondary | ICD-10-CM

## 2020-06-30 NOTE — Telephone Encounter (Signed)
Echo order complete per Dr. Agustin Cree. Patient aware through My Chart will be arrange at the [facility] location.

## 2020-07-02 ENCOUNTER — Other Ambulatory Visit: Payer: Self-pay | Admitting: Family Medicine

## 2020-07-16 ENCOUNTER — Encounter: Payer: Self-pay | Admitting: Family Medicine

## 2020-07-16 DIAGNOSIS — M1610 Unilateral primary osteoarthritis, unspecified hip: Secondary | ICD-10-CM

## 2020-07-16 MED ORDER — HYDROCODONE-ACETAMINOPHEN 5-325 MG PO TABS: 1.0000 | ORAL_TABLET | Freq: Three times a day (TID) | ORAL | 0 refills | Status: DC | PRN

## 2020-07-16 MED ORDER — HYDROCODONE-ACETAMINOPHEN 5-325 MG PO TABS
1.0000 | ORAL_TABLET | Freq: Four times a day (QID) | ORAL | 0 refills | Status: DC | PRN
Start: 2020-07-16 — End: 2020-08-16

## 2020-07-21 ENCOUNTER — Other Ambulatory Visit: Payer: Self-pay | Admitting: Cardiology

## 2020-07-21 NOTE — Telephone Encounter (Signed)
Carvedilol 3.125 mg # 180 tablets with 3 additional refills to pharmacy

## 2020-07-22 ENCOUNTER — Other Ambulatory Visit: Payer: Self-pay

## 2020-07-22 ENCOUNTER — Encounter: Payer: Self-pay | Admitting: Cardiology

## 2020-07-22 ENCOUNTER — Ambulatory Visit: Payer: No Typology Code available for payment source | Admitting: Cardiology

## 2020-07-22 VITALS — BP 142/82 | HR 87 | Ht 63.5 in | Wt 156.0 lb

## 2020-07-22 DIAGNOSIS — I420 Dilated cardiomyopathy: Secondary | ICD-10-CM | POA: Diagnosis not present

## 2020-07-22 DIAGNOSIS — I2510 Atherosclerotic heart disease of native coronary artery without angina pectoris: Secondary | ICD-10-CM

## 2020-07-22 DIAGNOSIS — I10 Essential (primary) hypertension: Secondary | ICD-10-CM

## 2020-07-22 HISTORY — DX: Atherosclerotic heart disease of native coronary artery without angina pectoris: I25.10

## 2020-07-22 MED ORDER — CARVEDILOL 6.25 MG PO TABS: 6.2500 mg | ORAL_TABLET | Freq: Two times a day (BID) | ORAL | 2 refills | Status: DC

## 2020-07-22 NOTE — Patient Instructions (Signed)
Medication Instructions:  Your physician has recommended you make the following change in your medication:  INCREASE: carvedilol 6.25 mg twice daily   *If you need a refill on your cardiac medications before your next appointment, please call your pharmacy*   Lab Work: None If you have labs (blood work) drawn today and your tests are completely normal, you will receive your results only by: Marland Kitchen MyChart Message (if you have MyChart) OR . A paper copy in the mail If you have any lab test that is abnormal or we need to change your treatment, we will call you to review the results.   Testing/Procedures: Non   Follow-Up: At [facility], you and your health needs are our priority.  As part of our continuing mission to provide you with exceptional heart care, we have created designated Provider Care Teams.  These Care Teams include your primary Cardiologist (physician) and Advanced Practice Providers (APPs -  Physician Assistants and Nurse Practitioners) who all work together to provide you with the care you need, when you need it.  We recommend signing up for the patient portal called "MyChart".  Sign up information is provided on this After Visit Summary.  MyChart is used to connect with patients for Virtual Visits (Telemedicine).  Patients are able to view lab/test results, encounter notes, upcoming appointments, etc.  Non-urgent messages can be sent to your provider as well.   To learn more about what you can do with MyChart, go to [email or web address].    Your next appointment:   1 month(s)  The format for your next appointment:   In Person  Provider:   Jenne Campus, MD   Other Instructions

## 2020-07-22 NOTE — Addendum Note (Signed)
Addended by: Senaida Ores on: 07/22/2020 12:25 PM   Modules accepted: Orders

## 2020-07-22 NOTE — Addendum Note (Signed)
Addended by: Darrel Reach on: 07/22/2020 12:02 PM   Modules accepted: Orders

## 2020-07-22 NOTE — Progress Notes (Signed)
Cardiology Office Note:    Date:  07/22/2020   [name], DOB Oct 16, 1965, MRN [id number]  PCP:  Darreld Mclean, MD  Cardiologist:  Jenne Campus, MD    Referring MD: Darreld Mclean, MD   Chief Complaint  Patient presents with  . Follow-up    History of Present Illness:    Erin Fields is a 55 y.o. female with past medical history significant of nonischemic cardiomyopathy with ejection fraction 25% diagnosed few months ago, essential hypertension, dyslipidemia, diabetes.  Comes today 2 months of follow-up.  What started this investigation of the fact that she required hip replacement surgery echocardiogram being done was found to have cardiomyopathy recently and of having cardiac catheterization done which showed 25% of RCA, 20% intermediate branch and 40% diagonal 1 branch disease.  Overall she is doing well denies have any chest pain tightness squeezing pressure burning chest, exercise ability is limited because of hip pain and probably hip.  Denies have any dizziness chest pain shortness of breath swelling of lower extremities  Past Medical History:  Diagnosis Date  . Allergic rhinitis due to animal (cat) (dog) hair and dander 04/27/2020  . Allergic rhinitis due to pollen 04/27/2020  . Anxiety   . Anxiety and depression 01/20/2018  . Asthma   . Asthma, persistent controlled 10/05/2018  . B12 deficiency 11/27/2019  . Borderline diabetes 12/17/2015  . Cancer [facility]) 2019   right breast cancer  . CERVICAL CANCER, HX OF 12/06/2006   Qualifier: Diagnosis of  By: Arnoldo Morale MD, Balinda Quails   . Chronic sinusitis 01/26/2015  . Chronic venous insufficiency 09/02/2015  . Colon polyps   . Congestive heart failure ([place]) 04/29/2020  . Controlled type 2 diabetes mellitus without complication, without long-term current use of insulin ([place]) 05/28/2017  . Crohn disease ([place]) 11/29/2016  . Depression   . Diabetes mellitus without complication ([place])    type 2  . Dilated cardiomyopathy  ([place]) ejection fraction 25% by echocardiogram from January 2022 04/29/2020  . Dyspnea 09/15/2014  . Elevated triglycerides with high cholesterol 11/26/2013  . Environmental allergies   . Eustachian tube dysfunction 12/04/2014  . Ex-smoker 11/18/2015  . Family history of breast cancer   . Genetic testing 07/16/2017   Negative genetic testing on the multi-cancer panel.  The Multi-Gene Panel offered by [name] includes sequencing and/or deletion duplication testing of the following 83 genes: ALK, APC, ATM, AXIN2,BAP1,  BARD1, BLM, BMPR1A, BRCA1, BRCA2, BRIP1, CASR, CDC73, CDH1, CDK4, CDKN1B, CDKN1C, CDKN2A (p14ARF), CDKN2A (p16INK4a), CEBPA, CHEK2, CTNNA1, DICER1, DIS3L2, EGFR (c.2369C>T, p.Thr790Met variant onl  . GERD (gastroesophageal reflux disease)   . HEADACHE 12/06/2006   Qualifier: Diagnosis of  By: Arnoldo Morale MD, Balinda Quails   . History of adenomatous polyp of colon 11/19/2017  . Hyperlipidemia   . Hypertension   . Hypothyroidism   . IDA (iron deficiency anemia) 11/06/2019  . Impaired fasting glucose 12/12/2017  . Insomnia 08/20/2013  . Irritable bowel syndrome 11/29/2006   Qualifier: Diagnosis of  By: Arnoldo Morale MD, John E   . Left sided abdominal pain 12/13/2017  . Liver hemangioma 08/24/2014  . Malignant neoplasm of upper-outer quadrant of right breast in female, estrogen receptor positive ([place]) 03/20/2017  . Migraines   . Mild persistent asthma, uncomplicated 04/25/3233  . Nasal polyp, unspecified 01/10/2018  . Oral herpes 04/20/2013  . Other allergic rhinitis 09/15/2014  . Personal history of radiation therapy 2019   36 radiation tx  . PONV (postoperative nausea and vomiting)  scopolamine patch helps  . Recurrent infections 10/05/2018  . Right upper lobe pulmonary nodule 01/10/2018  . Symptomatic varicose veins, right 09/02/2015  . Ulcerative colitis ([place])   . Varicose veins     Past Surgical History:  Procedure Laterality Date  . BREAST BIOPSY Right 02/2017  . BREAST BIOPSY Right 11/07/2019  .  BREAST LUMPECTOMY Right 04/24/2017  . BREAST LUMPECTOMY WITH RADIOACTIVE SEED AND SENTINEL LYMPH NODE BIOPSY Right 04/24/2017   Procedure: RIGHT BREAST LUMPECTOMY WITH RADIOACTIVE SEED AND SENTINEL LYMPH NODE BIOPSY;  Surgeon: [name], MD;  Location: [place];  Service: General;  Laterality: Right;  . crapal tunnel relase Left 05/2018  . ENDOMETRIAL ABLATION  2010   Had abnormal uterine bleeding (heavy and frequent), performed in [place], menses stopped completely 2 years after  . EXPLORATORY LAPAROTOMY     Had exploratory surgery at the age of 47 for abdominal pain with cyst found on her left ovary, not treated surgically  . FOOT SURGERY Right 2012  . HAND SURGERY Right   . RE-EXCISION OF BREAST LUMPECTOMY Right 05/15/2017   Procedure: RE-EXCISION OF BREAST LUMPECTOMY;  Surgeon: [name], MD;  Location: [place];  Service: General;  Laterality: Right;  . RIGHT/LEFT HEART CATH AND CORONARY ANGIOGRAPHY N/A 06/21/2020   Procedure: RIGHT/LEFT HEART CATH AND CORONARY ANGIOGRAPHY;  Surgeon: Nelva Bush, MD;  Location: [place] [facility];  Service: Cardiovascular;  Laterality: N/A;  . ROBOTIC ASSISTED SALPINGO OOPHERECTOMY Bilateral 08/29/2019   Procedure: XI ROBOTIC ASSISTED SALPINGO OOPHORECTOMY;  Surgeon: [name], MD;  Location: [facility];  Service: Gynecology;  Laterality: Bilateral;  . Septoplasty with turbinate reduction    . TONSILLECTOMY  1980  . VEIN SURGERY     Removal Right Leg  . WISDOM TOOTH EXTRACTION      Current Medications: Current Meds  Medication Sig  . acyclovir (ZOVIRAX) 400 MG tablet TAKE 1 TABLET (400 MG TOTAL) BY MOUTH 3 (THREE) TIMES DAILY AS NEEDED. (Patient taking differently: Take 400 mg by mouth 3 (three) times daily as needed (cold sores).)  . Adalimumab 80 MG/0.8ML PNKT Inject 80 mg into the skin every 14 (fourteen) days.  Marland Kitchen ascorbic acid (VITAMIN C) 500 MG tablet Take 500 mg by mouth  daily.  . Azelastine HCl 0.15 % SOLN Place 2 sprays into both nostrils daily. For 30 days  . budesonide-formoterol (SYMBICORT) 160-4.5 MCG/ACT inhaler Inhale 2 puffs into the lungs 2 (two) times daily.  . calcium carbonate (OS-CAL) 600 MG TABS tablet Take 600 mg by mouth daily with breakfast.  . carvedilol (COREG) 3.125 MG tablet TAKE 1 TABLET BY MOUTH 2 TIMES DAILY. (Patient taking differently: Take 3.125 mg by mouth 2 (two) times daily with a meal.)  . cetirizine (ZYRTEC) 10 MG tablet Take 10 mg by mouth daily.  . Cholecalciferol (VITAMIN D) 125 MCG (5000 UT) CAPS Take 5,000 Units by mouth daily.  . diphenoxylate-atropine (LOMOTIL) 2.5-0.025 MG tablet Take 1 tablet by mouth 4 (four) times daily as needed for diarrhea or loose stools.  Marland Kitchen EPINEPHrine 0.3 mg/0.3 mL IJ SOAJ injection Inject 0.3 mg into the muscle as needed for anaphylaxis.  Marland Kitchen exemestane (AROMASIN) 25 MG tablet TAKE 1 TABLET (25 MG TOTAL) BY MOUTH DAILY AFTER BREAKFAST.  Marland Kitchen HYDROcodone-acetaminophen (NORCO/VICODIN) 5-325 MG tablet Take 1 tablet by mouth every 6 (six) hours as needed for up to 20 doses. (Patient taking differently: Take 1 tablet by mouth every 6 (six) hours as needed for  severe pain or moderate pain.)  . Hyoscyamine Sulfate 0.375 MG TBCR Take 0.375 mg by mouth daily.  . lansoprazole (PREVACID SOLUTAB) 15 MG disintegrating tablet Take 15 mg by mouth daily.  Marland Kitchen levalbuterol (XOPENEX HFA) 45 MCG/ACT inhaler Inhale 2 puffs into the lungs every 4 (four) hours as needed for wheezing (Q4-6 Hours PRN).  Marland Kitchen levothyroxine (SYNTHROID) 88 MCG tablet Take 1 tablet (88 mcg total) by mouth daily before breakfast.  . MAGNESIUM PO Take 250 mg by mouth daily.  . meloxicam (MOBIC) 15 MG tablet Take 15 mg by mouth daily as needed for pain.  . Menthol, Topical Analgesic, (BIOFREEZE COLORLESS) 4 % GEL Apply 1 application topically daily as needed (Hip pain).  . mesalamine (LIALDA) 1.2 g EC tablet Take 2.4 g by mouth in the morning and at  bedtime.   . metFORMIN (GLUCOPHAGE) 500 MG tablet Take 1 tablet (500 mg total) by mouth 2 (two) times daily with a meal.  . montelukast (SINGULAIR) 10 MG tablet Take 10 mg by mouth at bedtime.   . promethazine (PHENERGAN) 25 MG tablet Take 0.5-1 tablets (12.5-25 mg total) by mouth daily as needed for nausea or vomiting.  . rosuvastatin (CRESTOR) 20 MG tablet Take 1 tablet (20 mg total) by mouth daily. (Patient taking differently: Take 20 mg by mouth at bedtime.)  . sacubitril-valsartan (ENTRESTO) 49-51 MG Take 1 tablet by mouth 2 (two) times daily.  Marland Kitchen triamcinolone (NASACORT) 55 MCG/ACT AERO nasal inhaler Place 2 sprays into the nose daily.  Marland Kitchen venlafaxine XR (EFFEXOR-XR) 150 MG 24 hr capsule Take 1 capsule (150 mg total) by mouth daily with breakfast.  . vitamin B-12 (CYANOCOBALAMIN) 1000 MCG tablet Take 1,000 mcg by mouth daily.  Marland Kitchen zinc gluconate 50 MG tablet Take 50 mg by mouth daily.  Marland Kitchen zolpidem (AMBIEN) 5 MG tablet TAKE 1 TABLET BY MOUTH AT BEDTIME AS NEEDED FOR SLEEP (Patient taking differently: Take 5 mg by mouth at bedtime.)     Allergies:   Losartan potassium, Ace inhibitors, Aspirin, Cefaclor, Oxycodone, Sulfa antibiotics, Lisinopril, and Other   Social History   Socioeconomic History  . Marital status: Widowed    Spouse name: Not on file  . Number of children: Not on file  . Years of education: Not on file  . Highest education level: Not on file  Occupational History  . Not on file  Tobacco Use  . Smoking status: Former Smoker    Packs/day: 3.00    Years: 15.00    Pack years: 45.00    Types: Cigarettes    Quit date: 09/14/1993    Years since quitting: 26.8  . Smokeless tobacco: Never Used  . Tobacco comment: Quit 26 yrs ago  Vaping Use  . Vaping Use: Never used  Substance and Sexual Activity  . Alcohol use: Yes    Comment: Drinks a couple every 2-3 months  . Drug use: No  . Sexual activity: Not Currently    Comment: ablation  Other Topics Concern  . Not on file   Social History Narrative  . Not on file   Social Determinants of Health   Financial Resource Strain: Not on file  Food Insecurity: Not on file  Transportation Needs: Not on file  Physical Activity: Not on file  Stress: Not on file  Social Connections: Not on file     Family History: The patient's family history includes Arthritis (age of onset: 42) in her mother; Brain cancer in her father; Breast cancer in her mother;  Colitis in her sister; Crohn's disease in her mother; Diabetes in her maternal grandfather and mother; Heart attack in her maternal grandfather; Heart disease in her maternal grandfather and mother; Hyperlipidemia in her mother; Hypertension in her mother; Leukemia in her maternal grandfather; Leukemia (age of onset: 59) in her cousin; Lung cancer in her father and mother. ROS:   Please see the history of present illness.    All 14 point review of systems negative except as described per history of present illness  EKGs/Labs/Other Studies Reviewed:      Recent Labs: 05/14/2020: ALT 12 06/15/2020: BUN 5; Creatinine, Ser 0.54; Platelets 442 06/21/2020: Hemoglobin 12.6; Potassium 4.1; Sodium 143  Recent Lipid Panel    Component Value Date/Time   CHOL 153 06/18/2019 0952   TRIG 159.0 (H) 06/18/2019 0952   HDL 37.50 (L) 06/18/2019 0952   CHOLHDL 4 06/18/2019 0952   VLDL 31.8 06/18/2019 0952   LDLCALC 83 06/18/2019 0952   LDLDIRECT 101.0 12/02/2018 0955    Physical Exam:    VS:  BP (!) 142/82 (BP Location: Left Arm, Patient Position: Sitting)   Pulse 87   Ht 5' 3.5" (1.613 m)   Wt 156 lb (70.8 kg)   SpO2 95%   BMI 27.20 kg/m     Wt Readings from Last 3 Encounters:  07/22/20 156 lb (70.8 kg)  06/21/20 157 lb (71.2 kg)  06/15/20 159 lb (72.1 kg)     GEN:  Well nourished, well developed in no acute distress HEENT: Normal NECK: No JVD; No carotid bruits LYMPHATICS: No lymphadenopathy CARDIAC: RRR, no murmurs, no rubs, no gallops RESPIRATORY:  Clear to  auscultation without rales, wheezing or rhonchi  ABDOMEN: Soft, non-tender, non-distended MUSCULOSKELETAL:  No edema; No deformity  SKIN: Warm and dry LOWER EXTREMITIES: no swelling NEUROLOGIC:  Alert and oriented x 3 PSYCHIATRIC:  Normal affect   ASSESSMENT:    1. Dilated cardiomyopathy (HCC) ejection fraction 25% by echocardiogram from January 2022   2. Coronary artery disease involving native coronary artery of native heart without angina pectoris   3. Primary hypertension    PLAN:    In order of problems listed above:  1. Cardiomyopathy I will do EKG today if EKG is fine then will double the dose of carvedilol, she is already on a decent dose of Entresto which I will continue.  Tomorrow she is scheduled to have an echocardiogram based on that we know what we standing if her echocardiogram showed improvement left ventricle ejection fraction that should be fine to proceed with surgery. 2. Congestive heart failure difficult to judge [place] [employer] because of hip problem.  But overall she looks hemodynamically compensated on physical exam. 3. Essential hypertension blood pressure still slightly on the higher side.  Will increase dose of carvedilol hopefully today that will improve blood pressure. 4. Need for hip replacement surgery.  Again will wait for echocardiogram tomorrow to decide about recommendation for this problem. 5. In the future if she will require titration of medication like for [employer] as well as Aldactone. 6. Status post cardiac catheterization wound after cardiac catheterization left forearm is healed completely.  Good pulses no pain no swelling no hematoma   Medication Adjustments/Labs and Tests Ordered: Current medicines are reviewed at length with the patient today.  Concerns regarding medicines are outlined above.  No orders of the defined types were placed in this encounter.  Medication changes: No orders of the defined types were placed in this  encounter.  Signed, Park Liter, MD, [facility] 07/22/2020 11:44 AM    [place]

## 2020-07-22 NOTE — Addendum Note (Signed)
Addended by: Senaida Ores on: 07/22/2020 12:37 PM   Modules accepted: Orders

## 2020-07-23 ENCOUNTER — Ambulatory Visit (HOSPITAL_COMMUNITY): Payer: No Typology Code available for payment source | Attending: Cardiology

## 2020-07-23 DIAGNOSIS — I420 Dilated cardiomyopathy: Secondary | ICD-10-CM

## 2020-07-23 LAB — ECHOCARDIOGRAM COMPLETE: S' Lateral: 5 cm

## 2020-07-23 MED ORDER — PERFLUTREN LIPID MICROSPHERE
1.0000 mL | INTRAVENOUS | Status: AC | PRN
  Administered 2020-07-23: 1 mL via INTRAVENOUS

## 2020-07-27 ENCOUNTER — Telehealth: Payer: Self-pay | Admitting: Emergency Medicine

## 2020-07-27 DIAGNOSIS — Z79899 Other long term (current) drug therapy: Secondary | ICD-10-CM

## 2020-07-27 NOTE — Telephone Encounter (Signed)
-----   Message from Park Liter, MD sent at 07/27/2020 11:12 AM EDT ----- Echocardiogram showed ejection fraction 20% to 25%.  Still severely diminished.  Case risk factors modification, please ask her to have Chem-7 done if Chem-7 is fine will add Aldactone.

## 2020-07-27 NOTE — Telephone Encounter (Signed)
Called patient. Informed her of results. She will have labs drawn.

## 2020-07-29 ENCOUNTER — Telehealth: Payer: Self-pay

## 2020-07-29 LAB — BASIC METABOLIC PANEL
BUN/Creatinine Ratio: 9 (ref 9–23)
BUN: 7 mg/dL (ref 6–24)
CO2: 20 mmol/L (ref 20–29)
Calcium: 9.7 mg/dL (ref 8.7–10.2)
Chloride: 102 mmol/L (ref 96–106)
Creatinine, Ser: 0.77 mg/dL (ref 0.57–1.00)
Glucose: 177 mg/dL — ABNORMAL HIGH (ref 65–99)
Potassium: 4.1 mmol/L (ref 3.5–5.2)
Sodium: 139 mmol/L (ref 134–144)
eGFR: 90 mL/min/{1.73_m2} (ref >59)

## 2020-07-29 NOTE — Telephone Encounter (Signed)
Patient notified of test results however she asked  For reason needing to add and the diuretic since she denies fluid retention. I have forward this message to Dr. Agustin Cree for for further instructions.

## 2020-07-29 NOTE — Telephone Encounter (Signed)
-----   Message from Park Liter, MD sent at 07/29/2020 10:30 AM EDT ----- Chem-7 looks good, please add Aldactone 12.5 mg daily, Chem-7 need to be repeated in 1 week

## 2020-07-30 ENCOUNTER — Telehealth: Payer: Self-pay

## 2020-07-30 ENCOUNTER — Encounter: Payer: Self-pay | Admitting: Cardiology

## 2020-07-30 DIAGNOSIS — I10 Essential (primary) hypertension: Secondary | ICD-10-CM

## 2020-07-30 DIAGNOSIS — I420 Dilated cardiomyopathy: Secondary | ICD-10-CM

## 2020-07-30 DIAGNOSIS — I2510 Atherosclerotic heart disease of native coronary artery without angina pectoris: Secondary | ICD-10-CM

## 2020-07-30 DIAGNOSIS — Z79899 Other long term (current) drug therapy: Secondary | ICD-10-CM

## 2020-07-30 MED ORDER — SPIRONOLACTONE 25 MG PO TABS: 12.5000 mg | ORAL_TABLET | Freq: Every day | ORAL | 1 refills | Status: DC

## 2020-07-30 NOTE — Telephone Encounter (Signed)
Patient returning call.

## 2020-07-30 NOTE — Telephone Encounter (Signed)
error 

## 2020-07-30 NOTE — Telephone Encounter (Signed)
Message addressed.

## 2020-07-30 NOTE — Telephone Encounter (Signed)
-----   Message from Park Liter, MD sent at 07/30/2020 10:11 AM EDT ----- We need to do every 1 week and to improve left ventricle ejection fraction, this is one of the medication that can help.  On top of that people with her condition when they take this medication daily for longer.  This is the reason for addition of this medication. ----- Message ----- From: Darrel Reach, CMA Sent: 07/29/2020  11:06 AM EDT To: Park Liter, MD  Patient notified of results and she asked what is the reason for adding this med if her results are good and she is not retaining fluids. Please advise

## 2020-07-30 NOTE — Telephone Encounter (Signed)
Patient notified of the following per Dr. Agustin Cree and verbalized understanding and agrees with plan. Rx sent and lab order complete

## 2020-08-02 NOTE — Progress Notes (Signed)
Cardiology Office Note:    Date:  08/02/2020   [name], DOB May 08, 1965, MRN [id number]  PCP:  Darreld Mclean, MD  Cardiologist:  Jenne Campus, MD    Referring MD: Darreld Mclean, MD   No chief complaint on file. I am doing fine  History of Present Illness:    Erin Fields is a 55 y.o. female with past medical history significant for cardiomyopathy ejection fraction 25%, diabetes, essential hypertension, history of breast CA status postlumpectomy in remission.  She did have a cardiac catheterization done to rule out ischemia as etiology of her cardiomyopathy.  Likely did not show significant coronary artery disease.  She comes today to my office to follow-up.  Overall she is doing well denies have any chest pain tightness squeezing pressure burning chest.  No swelling of lower extremities.  Past Medical History:  Diagnosis Date  . Allergic rhinitis due to animal (cat) (dog) hair and dander 04/27/2020  . Allergic rhinitis due to pollen 04/27/2020  . Anxiety   . Anxiety and depression 01/20/2018  . Asthma   . Asthma, persistent controlled 10/05/2018  . B12 deficiency 11/27/2019  . Borderline diabetes 12/17/2015  . Cancer [facility]) 2019   right breast cancer  . CERVICAL CANCER, HX OF 12/06/2006   Qualifier: Diagnosis of  By: Arnoldo Morale MD, Balinda Quails   . Chronic sinusitis 01/26/2015  . Chronic venous insufficiency 09/02/2015  . Colon polyps   . Congestive heart failure ([place]) 04/29/2020  . Controlled type 2 diabetes mellitus without complication, without long-term current use of insulin ([place]) 05/28/2017  . Crohn disease ([place]) 11/29/2016  . Depression   . Diabetes mellitus without complication ([place])    type 2  . Dilated cardiomyopathy ([place]) ejection fraction 25% by echocardiogram from January 2022 04/29/2020  . Dyspnea 09/15/2014  . Elevated triglycerides with high cholesterol 11/26/2013  . Environmental allergies   . Eustachian tube dysfunction 12/04/2014  . Ex-smoker  11/18/2015  . Family history of breast cancer   . Genetic testing 07/16/2017   Negative genetic testing on the multi-cancer panel.  The Multi-Gene Panel offered by [name] includes sequencing and/or deletion duplication testing of the following 83 genes: ALK, APC, ATM, AXIN2,BAP1,  BARD1, BLM, BMPR1A, BRCA1, BRCA2, BRIP1, CASR, CDC73, CDH1, CDK4, CDKN1B, CDKN1C, CDKN2A (p14ARF), CDKN2A (p16INK4a), CEBPA, CHEK2, CTNNA1, DICER1, DIS3L2, EGFR (c.2369C>T, p.Thr790Met variant onl  . GERD (gastroesophageal reflux disease)   . HEADACHE 12/06/2006   Qualifier: Diagnosis of  By: Arnoldo Morale MD, Balinda Quails   . History of adenomatous polyp of colon 11/19/2017  . Hyperlipidemia   . Hypertension   . Hypothyroidism   . IDA (iron deficiency anemia) 11/06/2019  . Impaired fasting glucose 12/12/2017  . Insomnia 08/20/2013  . Irritable bowel syndrome 11/29/2006   Qualifier: Diagnosis of  By: Arnoldo Morale MD, John E   . Left sided abdominal pain 12/13/2017  . Liver hemangioma 08/24/2014  . Malignant neoplasm of upper-outer quadrant of right breast in female, estrogen receptor positive ([place]) 03/20/2017  . Migraines   . Mild persistent asthma, uncomplicated 4/78/2956  . Nasal polyp, unspecified 01/10/2018  . Oral herpes 04/20/2013  . Other allergic rhinitis 09/15/2014  . Personal history of radiation therapy 2019   36 radiation tx  . PONV (postoperative nausea and vomiting)    scopolamine patch helps  . Recurrent infections 10/05/2018  . Right upper lobe pulmonary nodule 01/10/2018  . Symptomatic varicose veins, right 09/02/2015  . Ulcerative colitis ([place])   . Varicose  veins     Past Surgical History:  Procedure Laterality Date  . BREAST BIOPSY Right 02/2017  . BREAST BIOPSY Right 11/07/2019  . BREAST LUMPECTOMY Right 04/24/2017  . BREAST LUMPECTOMY WITH RADIOACTIVE SEED AND SENTINEL LYMPH NODE BIOPSY Right 04/24/2017   Procedure: RIGHT BREAST LUMPECTOMY WITH RADIOACTIVE SEED AND SENTINEL LYMPH NODE BIOPSY;  Surgeon: [name],  MD;  Location: [facility];  Service: General;  Laterality: Right;  . crapal tunnel relase Left 05/2018  . ENDOMETRIAL ABLATION  2010   Had abnormal uterine bleeding (heavy and frequent), performed in [place], menses stopped completely 2 years after  . EXPLORATORY LAPAROTOMY     Had exploratory surgery at the age of 41 for abdominal pain with cyst found on her left ovary, not treated surgically  . FOOT SURGERY Right 2012  . HAND SURGERY Right   . RE-EXCISION OF BREAST LUMPECTOMY Right 05/15/2017   Procedure: RE-EXCISION OF BREAST LUMPECTOMY;  Surgeon: [name], MD;  Location: [facility];  Service: General;  Laterality: Right;  . RIGHT/LEFT HEART CATH AND CORONARY ANGIOGRAPHY N/A 06/21/2020   Procedure: RIGHT/LEFT HEART CATH AND CORONARY ANGIOGRAPHY;  Surgeon: Nelva Bush, MD;  Location: [facility];  Service: Cardiovascular;  Laterality: N/A;  . ROBOTIC ASSISTED SALPINGO OOPHERECTOMY Bilateral 08/29/2019   Procedure: XI ROBOTIC ASSISTED SALPINGO OOPHORECTOMY;  Surgeon: [name], MD;  Location: [facility];  Service: Gynecology;  Laterality: Bilateral;  . Septoplasty with turbinate reduction    . TONSILLECTOMY  1980  . VEIN SURGERY     Removal Right Leg  . WISDOM TOOTH EXTRACTION      Current Medications: No outpatient medications have been marked as taking for the 05/21/20 encounter (Office Visit) with Park Liter, MD.     Allergies:   Losartan potassium, Ace inhibitors, Aspirin, Cefaclor, Oxycodone, Sulfa antibiotics, Lisinopril, and Other   Social History   Socioeconomic History  . Marital status: Widowed    Spouse name: Not on file  . Number of children: Not on file  . Years of education: Not on file  . Highest education level: Not on file  Occupational History  . Not on file  Tobacco Use  . Smoking status: Former Smoker    Packs/day: 3.00    Years: 15.00    Pack years: 45.00    Types: Cigarettes     Quit date: 09/14/1993    Years since quitting: 26.9  . Smokeless tobacco: Never Used  . Tobacco comment: Quit 26 yrs ago  Vaping Use  . Vaping Use: Never used  Substance and Sexual Activity  . Alcohol use: Yes    Comment: Drinks a couple every 2-3 months  . Drug use: No  . Sexual activity: Not Currently    Comment: ablation  Other Topics Concern  . Not on file  Social History Narrative  . Not on file   Social Determinants of Health   Financial Resource Strain: Not on file  Food Insecurity: Not on file  Transportation Needs: Not on file  Physical Activity: Not on file  Stress: Not on file  Social Connections: Not on file     Family History: The patient's family history includes Arthritis (age of onset: 31) in her mother; Brain cancer in her father; Breast cancer in her mother; Colitis in her sister; Crohn's disease in her mother; Diabetes in her maternal grandfather and mother; Heart attack in her maternal grandfather; Heart disease in her maternal grandfather and mother;  Hyperlipidemia in her mother; Hypertension in her mother; Leukemia in her maternal grandfather; Leukemia (age of onset: 43) in her cousin; Lung cancer in her father and mother. ROS:   Please see the history of present illness.    All 14 point review of systems negative except as described per history of present illness  EKGs/Labs/Other Studies Reviewed:      Recent Labs: 05/14/2020: ALT 12 06/15/2020: Platelets 442 06/21/2020: Hemoglobin 12.6 07/28/2020: BUN 7; Creatinine, Ser 0.77; Potassium 4.1; Sodium 139  Recent Lipid Panel    Component Value Date/Time   CHOL 153 06/18/2019 0952   TRIG 159.0 (H) 06/18/2019 0952   HDL 37.50 (L) 06/18/2019 0952   CHOLHDL 4 06/18/2019 0952   VLDL 31.8 06/18/2019 0952   LDLCALC 83 06/18/2019 0952   LDLDIRECT 101.0 12/02/2018 0955    Physical Exam:    VS:  There were no vitals taken for this visit.    Wt Readings from Last 3 Encounters:  07/22/20 156 lb (70.8 kg)   06/21/20 157 lb (71.2 kg)  06/15/20 159 lb (72.1 kg)     GEN:  Well nourished, well developed in no acute distress HEENT: Normal NECK: No JVD; No carotid bruits LYMPHATICS: No lymphadenopathy CARDIAC: RRR, no murmurs, no rubs, no gallops RESPIRATORY:  Clear to auscultation without rales, wheezing or rhonchi  ABDOMEN: Soft, non-tender, non-distended MUSCULOSKELETAL:  No edema; No deformity  SKIN: Warm and dry LOWER EXTREMITIES: no swelling NEUROLOGIC:  Alert and oriented x 3 PSYCHIATRIC:  Normal affect   ASSESSMENT:    1. Hypertension, unspecified type   2. Dilated cardiomyopathy ([place]) ejection fraction 25% by echocardiogram from January 2022   3. Chronic systolic congestive heart failure ([place])   4. Controlled type 2 diabetes mellitus without complication, without long-term current use of insulin ([place])   5. Malignant neoplasm of upper-outer quadrant of right breast in female, estrogen receptor positive ([place])    PLAN:    In order of problems listed above:  1. Cardiomyopathy which is nonischemic in origin ejection fraction 25%.  We will repeat her echocardiogram.  I will increase dose of carvedilol today.  She is already on [place] which I will continue. 2. Chronic congestive systolic heart failure.  She does seem serial volume in today and compensated continue present management. 3. Essential hypertension blood pressure well controlled continue present management. 4. Malignant breast CA.  Likely stable in remission.   Medication Adjustments/Labs and Tests Ordered: Current medicines are reviewed at length with the patient today.  Concerns regarding medicines are outlined above.  Orders Placed This Encounter  Procedures  . EKG 12-Lead   Medication changes: No orders of the defined types were placed in this encounter.   Signed, Park Liter, MD, [facility] 08/02/2020 12:05 PM    [place]

## 2020-08-09 NOTE — Progress Notes (Signed)
[facility]   [phone] [phone]   Clinic Follow up Note   Patient Care Team: Copland, Gay Filler, MD as PCP - General (Family Medicine) [name], MD as Referring Physician (Obstetrics and Gynecology) [name], MD as Consulting Physician (Gastroenterology) [name], MD as Referring Physician (Allergy) [name], MD as Consulting Physician (General Surgery) [name], MD as Consulting Physician (Radiation Oncology) [name], MD as Consulting Physician (Hematology) Delice Bison, Charlestine Massed, NP as Nurse Practitioner (Hematology and Oncology) [name], MD as Consulting Physician (Gynecologic Oncology)  Date of Service:  08/13/2020  CHIEF COMPLAINT: F/u of right breast cancer  SUMMARY OF ONCOLOGIC HISTORY: Oncology History Overview Note  Cancer Staging Malignant neoplasm of upper-outer quadrant of right breast in female, estrogen receptor positive ([place]) Staging form: Breast, AJCC 8th Edition - Clinical stage from 03/16/2017: Stage IA (cT1b, cN0, cM0, G2, ER: Positive, PR: Positive, HER2: Negative) - Signed by [name], MD on 03/28/2017 - Pathologic stage from 05/15/2017: Stage IA (pT2, pN0, cM0, G2, ER+, PR+, HER2-, Oncotype DX score: 3) - Signed by [name], MD on 08/06/2017     Malignant neoplasm of upper-outer quadrant of right breast in female, estrogen receptor positive ([place])  03/15/2017 Mammogram   Diagnostic Mammogram Right Breast  IMPRESSION: Palpable abnormality corresponds to distortion and focal mass associated acoustic shadowing measuring 0.8x0.9x0.9cm in the 930 o'clock location of the right breast 7 cm from the nipple.   RECOMMENDATION: Ultrasound-guided core biopsy is recommended and scheduled for the patient.   03/16/2017 Initial Biopsy   Biopsy Results  Diagnosis 03/16/17 Breast, right, needle core biopsy, 9:30 o'clock, ribbon clip placed - INVASIVE MAMMARY CARCINOMA. - SEE  COMMENT.    03/16/2017 Receptors her2   Estrogen Receptor: 95%, POSITIVE, STRONG STAINING INTENSITY Progesterone Receptor: 95%, POSITIVE, STRONG STAINING INTENSITY Proliferation Marker Ki67: 3% HER2: NEGATIVE   03/20/2017 Initial Diagnosis   Malignant neoplasm of upper-outer quadrant of right breast in female, estrogen receptor positive ([place])   04/24/2017 Surgery   Right Breast Lumpectomy with Sentinel Node Biopsy with Dr. [name]    04/24/2017 Pathology Results   Diagnosis 04/24/17 1. Breast, lumpectomy, Right w/seed - INVASIVE LOBULAR CARCINOMA, GRADE 2, SPANNING 2.2 CM. - LOBULAR NEOPLASIA (ATYPICAL LOBULAR HYPERPLASIA). - ANTERIOR SUPERIOR MARGIN IS BROADLY POSITIVE. - BIOPSY SITE. - SEE ONCOLOGY TABLE. 2. Breast, excision, Right additional Medial Margin - INVASIVE LOBULAR CARCINOMA. - LOBULAR CARCINOMA IN SITU. - INVASIVE CARCINOMA IS 0.3 CM FROM NEW MEDIAL MARGIN. 3. Lymph node, sentinel, biopsy, Right Axillary #1 - ISOLATED TUMOR CELLS IN ONE OF ONE LYMPH NODES (0/1). 4. Lymph node, sentinel, biopsy, Right Axillary #2 - ONE OF ONE LYMPH NODES NEGATIVE FOR CARCINOMA (0/1). 5. Lymph node, sentinel, biopsy, Right Axillary #3 - ONE OF ONE LYMPH NODES NEGATIVE FOR CARCINOMA (0/1).   04/24/2017 Oncotype testing   Her Oncotyple recurrence score was 3, her distant recurrence score with Tamoxifen was 3% and her adjuvant chemotherapy benefit was <1%.    05/15/2017 Surgery   Re-excision of Right Breast Lumpectomy with Dr. [name]    05/15/2017 Pathology Results   Diagnosis 05/15/17 Breast, excision, Right additional anterior margin - INVASIVE LOBULAR CARCINOMA. - ATYPICAL LOBULAR HYPERPLASIA. - THE NEW ANTERIOR SURGICAL RESECTION MARGIN IS NEGATIVE FOR CARCINOMA.   05/15/2017 Cancer Staging   Staging form: Breast, AJCC 8th Edition - Pathologic stage from 05/15/2017: Stage IA (pT2, pN0, cM0, G2, ER+, PR+, HER2-, Oncotype DX score: 3) - Signed by [name], MD on 08/06/2017  06/26/2017 -   Radiation Therapy   Adjuvant RT with Dr. [name]   07/12/2017 Genetic Testing   Negative genetic testing on the multi-cancer panel.  The Multi-Gene Panel offered by [name] includes sequencing and/or deletion duplication testing of the following 83 genes: ALK, APC, ATM, AXIN2,BAP1,  BARD1, BLM, BMPR1A, BRCA1, BRCA2, BRIP1, CASR, CDC73, CDH1, CDK4, CDKN1B, CDKN1C, CDKN2A (p14ARF), CDKN2A (p16INK4a), CEBPA, CHEK2, CTNNA1, DICER1, DIS3L2, EGFR (c.2369C>T, p.Thr790Met variant only), EPCAM (Deletion/duplication testing only), FH, FLCN, GATA2, GPC3, GREM1 (Promoter region deletion/duplication testing only), HOXB13 (c.251G>A, p.Gly84Glu), HRAS, KIT, MAX, MEN1, MET, MITF (c.952G>A, p.Glu318Lys variant only), MLH1, MSH2, MSH3, MSH6, MUTYH, NBN, NF1, NF2, NTHL1, PALB2, PDGFRA, PHOX2B, PMS2, POLD1, POLE, POT1, PRKAR1A, PTCH1, PTEN, RAD50, RAD51C, RAD51D, RB1, RECQL4, RET, RUNX1, SDHAF2, SDHA (sequence changes only), SDHB, SDHC, SDHD, SMAD4, SMARCA4, SMARCB1, SMARCE1, STK11, SUFU, TERT, TERT, TMEM127, TP53, TSC1, TSC2, VHL, WRN and WT1.  The report date is July 12, 2017.    08/13/2017 Imaging   08/13/2017 DEXA ASSESSMENT: The BMD measured at AP Spine L1-L4 is 1.120 g/cm2 with a T-score of -0.5. This patient is considered normal according to [facility]) criteria.    08/2017 -  Anti-estrogen oral therapy   Letrozole daily, changed to Exemestane due to poor tolerance of Letrozole   01/16/2018 Imaging   01/16/2018 CT Chest IMPRESSION: Stable 2 mm RIGHT upper lobe nodule.  Post radiation therapy changes at the anterolateral RIGHT upper lobe.  Post RIGHT breast surgery and RIGHT axillary node dissection.  No new abnormalities.  Aortic Atherosclerosis (ICD10-I70.0).   08/09/2018 Mammogram   IMPRESSION: 1. No mammographic evidence of malignancy involving either breast. 2. Expected post lumpectomy and post radiation changes involving the RIGHT breast.      CURRENT THERAPY:   Adjuvant exemestane, started June 2019    INTERVAL HISTORY:  [name] is here for a follow up pf right breast cancer. She was last seen by me in 06/2018 and seen by NP Lacie in interim 6 months ago. She presents to the clinic alone. She notes her CBC had clotted initially with her blood draw today and had to be redrawn. She notes she was diagnosed with CHF in January.    REVIEW OF SYSTEMS:   Constitutional: Denies fevers, chills or abnormal weight loss Eyes: Denies blurriness of vision Ears, nose, mouth, throat, and face: Denies mucositis or sore throat Respiratory: Denies cough, dyspnea or wheezes Cardiovascular: Denies palpitation, chest discomfort or lower extremity swelling Gastrointestinal:  Denies nausea, heartburn or change in bowel habits Skin: Denies abnormal skin rashes Lymphatics: Denies new lymphadenopathy or easy bruising Neurological:Denies numbness, tingling or new weaknesses Behavioral/Psych: Mood is stable, no new changes  All other systems were reviewed with the patient and are negative.  MEDICAL HISTORY:  Past Medical History:  Diagnosis Date  . Allergic rhinitis due to animal (cat) (dog) hair and dander 04/27/2020  . Allergic rhinitis due to pollen 04/27/2020  . Anxiety   . Anxiety and depression 01/20/2018  . Asthma   . Asthma, persistent controlled 10/05/2018  . B12 deficiency 11/27/2019  . Borderline diabetes 12/17/2015  . Cancer [facility]) 2019   right breast cancer  . CERVICAL CANCER, HX OF 12/06/2006   Qualifier: Diagnosis of  By: Arnoldo Morale MD, Balinda Quails   . Chronic sinusitis 01/26/2015  . Chronic venous insufficiency 09/02/2015  . Colon polyps   . Congestive heart failure ([place]) 04/29/2020  . Controlled type 2 diabetes mellitus without complication, without long-term current use of insulin ([place])  05/28/2017  . Crohn disease ([place]) 11/29/2016  . Depression   . Diabetes mellitus without complication ([place])    type 2  . Dilated cardiomyopathy ([place]) ejection fraction  25% by echocardiogram from January 2022 04/29/2020  . Dyspnea 09/15/2014  . Elevated triglycerides with high cholesterol 11/26/2013  . Environmental allergies   . Eustachian tube dysfunction 12/04/2014  . Ex-smoker 11/18/2015  . Family history of breast cancer   . Genetic testing 07/16/2017   Negative genetic testing on the multi-cancer panel.  The Multi-Gene Panel offered by [name] includes sequencing and/or deletion duplication testing of the following 83 genes: ALK, APC, ATM, AXIN2,BAP1,  BARD1, BLM, BMPR1A, BRCA1, BRCA2, BRIP1, CASR, CDC73, CDH1, CDK4, CDKN1B, CDKN1C, CDKN2A (p14ARF), CDKN2A (p16INK4a), CEBPA, CHEK2, CTNNA1, DICER1, DIS3L2, EGFR (c.2369C>T, p.Thr790Met variant onl  . GERD (gastroesophageal reflux disease)   . HEADACHE 12/06/2006   Qualifier: Diagnosis of  By: Arnoldo Morale MD, Balinda Quails   . History of adenomatous polyp of colon 11/19/2017  . Hyperlipidemia   . Hypertension   . Hypothyroidism   . IDA (iron deficiency anemia) 11/06/2019  . Impaired fasting glucose 12/12/2017  . Insomnia 08/20/2013  . Irritable bowel syndrome 11/29/2006   Qualifier: Diagnosis of  By: Arnoldo Morale MD, John E   . Left sided abdominal pain 12/13/2017  . Liver hemangioma 08/24/2014  . Malignant neoplasm of upper-outer quadrant of right breast in female, estrogen receptor positive ([place]) 03/20/2017  . Migraines   . Mild persistent asthma, uncomplicated 04/25/3233  . Nasal polyp, unspecified 01/10/2018  . Oral herpes 04/20/2013  . Other allergic rhinitis 09/15/2014  . Personal history of radiation therapy 2019   36 radiation tx  . PONV (postoperative nausea and vomiting)    scopolamine patch helps  . Recurrent infections 10/05/2018  . Right upper lobe pulmonary nodule 01/10/2018  . Symptomatic varicose veins, right 09/02/2015  . Ulcerative colitis ([place])   . Varicose veins     SURGICAL HISTORY: Past Surgical History:  Procedure Laterality Date  . BREAST BIOPSY Right 02/2017  . BREAST BIOPSY Right 11/07/2019  . BREAST  LUMPECTOMY Right 04/24/2017  . BREAST LUMPECTOMY WITH RADIOACTIVE SEED AND SENTINEL LYMPH NODE BIOPSY Right 04/24/2017   Procedure: RIGHT BREAST LUMPECTOMY WITH RADIOACTIVE SEED AND SENTINEL LYMPH NODE BIOPSY;  Surgeon: [name], MD;  Location: [facility];  Service: General;  Laterality: Right;  . crapal tunnel relase Left 05/2018  . ENDOMETRIAL ABLATION  2010   Had abnormal uterine bleeding (heavy and frequent), performed in [place], menses stopped completely 2 years after  . EXPLORATORY LAPAROTOMY     Had exploratory surgery at the age of 84 for abdominal pain with cyst found on her left ovary, not treated surgically  . FOOT SURGERY Right 2012  . HAND SURGERY Right   . RE-EXCISION OF BREAST LUMPECTOMY Right 05/15/2017   Procedure: RE-EXCISION OF BREAST LUMPECTOMY;  Surgeon: [name], MD;  Location: [facility];  Service: General;  Laterality: Right;  . RIGHT/LEFT HEART CATH AND CORONARY ANGIOGRAPHY N/A 06/21/2020   Procedure: RIGHT/LEFT HEART CATH AND CORONARY ANGIOGRAPHY;  Surgeon: Nelva Bush, MD;  Location: [facility];  Service: Cardiovascular;  Laterality: N/A;  . ROBOTIC ASSISTED SALPINGO OOPHERECTOMY Bilateral 08/29/2019   Procedure: XI ROBOTIC ASSISTED SALPINGO OOPHORECTOMY;  Surgeon: [name], MD;  Location[facility];  Service: Gynecology;  Laterality: Bilateral;  . Septoplasty with turbinate reduction    . TONSILLECTOMY  1980  . VEIN SURGERY  Removal Right Leg  . WISDOM TOOTH EXTRACTION      I have reviewed the social history and family history with the patient and they are unchanged from previous note.  ALLERGIES:  is allergic to losartan potassium, ace inhibitors, aspirin, cefaclor, oxycodone, sulfa antibiotics, lisinopril, and other.  MEDICATIONS:  Current Outpatient Medications  Medication Sig Dispense Refill  . acyclovir (ZOVIRAX) 400 MG tablet TAKE 1 TABLET (400 MG TOTAL) BY MOUTH 3 (THREE)  TIMES DAILY AS NEEDED. (Patient taking differently: Take 400 mg by mouth 3 (three) times daily as needed (cold sores).) 270 tablet 1  . Adalimumab 80 MG/0.8ML PNKT Inject 80 mg into the skin every 14 (fourteen) days.    Marland Kitchen ascorbic acid (VITAMIN C) 500 MG tablet Take 500 mg by mouth daily.    . Azelastine HCl 0.15 % SOLN Place 2 sprays into both nostrils daily. For 30 days    . budesonide-formoterol (SYMBICORT) 160-4.5 MCG/ACT inhaler Inhale 2 puffs into the lungs 2 (two) times daily. 1 each 3  . calcium carbonate (OS-CAL) 600 MG TABS tablet Take 600 mg by mouth daily with breakfast.    . carvedilol (COREG) 6.25 MG tablet Take 1 tablet (6.25 mg total) by mouth 2 (two) times daily. 60 tablet 2  . cetirizine (ZYRTEC) 10 MG tablet Take 10 mg by mouth daily.    . Cholecalciferol (VITAMIN D) 125 MCG (5000 UT) CAPS Take 5,000 Units by mouth daily.    . diphenoxylate-atropine (LOMOTIL) 2.5-0.025 MG tablet Take 1 tablet by mouth 4 (four) times daily as needed for diarrhea or loose stools.    Marland Kitchen EPINEPHrine 0.3 mg/0.3 mL IJ SOAJ injection Inject 0.3 mg into the muscle as needed for anaphylaxis.    Marland Kitchen exemestane (AROMASIN) 25 MG tablet TAKE 1 TABLET (25 MG TOTAL) BY MOUTH DAILY AFTER BREAKFAST. 90 tablet 1  . HYDROcodone-acetaminophen (NORCO/VICODIN) 5-325 MG tablet Take 1 tablet by mouth every 6 (six) hours as needed for up to 20 doses. (Patient taking differently: Take 1 tablet by mouth every 6 (six) hours as needed for severe pain or moderate pain.) 20 tablet 0  . Hyoscyamine Sulfate 0.375 MG TBCR Take 0.375 mg by mouth daily.    . lansoprazole (PREVACID SOLUTAB) 15 MG disintegrating tablet Take 15 mg by mouth daily.    Marland Kitchen levalbuterol (XOPENEX HFA) 45 MCG/ACT inhaler Inhale 2 puffs into the lungs every 4 (four) hours as needed for wheezing (Q4-6 Hours PRN).    Marland Kitchen levothyroxine (SYNTHROID) 88 MCG tablet Take 1 tablet (88 mcg total) by mouth daily before breakfast. 90 tablet 1  . MAGNESIUM PO Take 250 mg by  mouth daily.    . meloxicam (MOBIC) 15 MG tablet Take 15 mg by mouth daily as needed for pain.    . Menthol, Topical Analgesic, (BIOFREEZE COLORLESS) 4 % GEL Apply 1 application topically daily as needed (Hip pain).    . mesalamine (LIALDA) 1.2 g EC tablet Take 2.4 g by mouth in the morning and at bedtime.     . metFORMIN (GLUCOPHAGE) 500 MG tablet Take 1 tablet (500 mg total) by mouth 2 (two) times daily with a meal. 180 tablet 3  . montelukast (SINGULAIR) 10 MG tablet Take 10 mg by mouth at bedtime.     . promethazine (PHENERGAN) 25 MG tablet Take 0.5-1 tablets (12.5-25 mg total) by mouth daily as needed for nausea or vomiting. 30 tablet 5  . rosuvastatin (CRESTOR) 20 MG tablet Take 1 tablet (20 mg total) by mouth  daily. (Patient taking differently: Take 20 mg by mouth at bedtime.) 90 tablet 3  . sacubitril-valsartan (ENTRESTO) 49-51 MG Take 1 tablet by mouth 2 (two) times daily. 60 tablet 3  . spironolactone (ALDACTONE) 25 MG tablet Take 0.5 tablets (12.5 mg total) by mouth daily. 30 tablet 1  . triamcinolone (NASACORT) 55 MCG/ACT AERO nasal inhaler Place 2 sprays into the nose daily.    Marland Kitchen venlafaxine XR (EFFEXOR-XR) 150 MG 24 hr capsule Take 1 capsule (150 mg total) by mouth daily with breakfast. 90 capsule 1  . vitamin B-12 (CYANOCOBALAMIN) 1000 MCG tablet Take 1,000 mcg by mouth daily.    Marland Kitchen zinc gluconate 50 MG tablet Take 50 mg by mouth daily.    Marland Kitchen zolpidem (AMBIEN) 5 MG tablet TAKE 1 TABLET BY MOUTH AT BEDTIME AS NEEDED FOR SLEEP (Patient taking differently: Take 5 mg by mouth at bedtime.) 30 tablet 2   No current facility-administered medications for this visit.    PHYSICAL EXAMINATION: ECOG PERFORMANCE STATUS: 2 - Symptomatic, <50% confined to bed  Vitals:   08/13/20 0954  BP: 139/81  Pulse: 93  Resp: 17  Temp: 97.8 F (36.6 C)  SpO2: 97%   Filed Weights   08/13/20 0954  Weight: 156 lb 6.4 oz (70.9 kg)    GENERAL:alert, no distress and comfortable SKIN: skin color,  texture, turgor are normal, no rashes or significant lesions EYES: normal, Conjunctiva are pink and non-injected, sclera clear  NECK: supple, thyroid normal size, non-tender, without nodularity LYMPH:  no palpable lymphadenopathy in the cervical, axillary  LUNGS: clear to auscultation and percussion with normal breathing effort HEART: regular rate & rhythm and no murmurs and no lower extremity edema ABDOMEN:abdomen soft, non-tender and normal bowel sounds Musculoskeletal:no cyanosis of digits and no clubbing  NEURO: alert & oriented x 3 with fluent speech, no focal motor/sensory deficits BREAST: S/p right lumpectomy: surgical incision healed well with mild scar tissue. No palpable mass, nodules or adenopathy bilaterally. Breast exam benign.   LABORATORY DATA:  I have reviewed the data as listed CBC Latest Ref Rng & Units 08/13/2020 06/21/2020 06/15/2020  WBC 4.0 - 10.5 K/uL 7.5 - 8.8  Hemoglobin 12.0 - 15.0 g/dL 12.9 12.6 13.9  Hematocrit 36.0 - 46.0 % 38.6 37.0 41.7  Platelets 150 - 400 K/uL 394 - 442     CMP Latest Ref Rng & Units 08/13/2020 08/10/2020 07/28/2020  Glucose 70 - 99 mg/dL 112(H) 103(H) 177(H)  BUN 6 - 20 mg/dL 6 8 7   Creatinine 0.44 - 1.00 mg/dL 0.69 0.61 0.77  Sodium 135 - 145 mmol/L 139 139 139  Potassium 3.5 - 5.1 mmol/L 4.4 4.6 4.1  Chloride 98 - 111 mmol/L 103 102 102  CO2 22 - 32 mmol/L 26 24 20   Calcium 8.9 - 10.3 mg/dL 9.7 9.7 9.7  Total Protein 6.5 - 8.1 g/dL 7.1 - -  Total Bilirubin 0.3 - 1.2 mg/dL 1.0 - -  Alkaline Phos 38 - 126 U/L 72 - -  AST 15 - 41 U/L 14(L) - -  ALT 0 - 44 U/L 13 - -      RADIOGRAPHIC STUDIES: I have personally reviewed the radiological images as listed and agreed with the findings in the report. No results found.   ASSESSMENT & PLAN:  Erin Fields is a 55 y.o. female with   1. Malignant neoplasm of upper-outer quadrant of right breast, invasive lobular carcinoma, stage IA, pT2N0M0, ER/PR: Positive, HER2 negative, Grade  II -She was initially  diagnosed on 03/16/2017. She was treated with right lumpectomy, re-excision surgery and adjuvant radiation.  -I started her on antiestrogen therapy with Letrozole in 08/2017. Due to poor tolerance with joint pain, this was changed to Exemestane in 09/2017. She is tolerating well.  -From a breast cancer standpoint, She is clinically doing well. Lab reviewed, her CBC and CMP are within normal limits. Her physical exam and her 04/2020 mammogram were unremarkable. There is no clinical concern for recurrence. -Given recent diagnosis of severe non-ischemia CHF, patient has concern this is related to Exemestane. I discussed I have low suspicion this is related to her Exemestane, but due to her severe CHF, she can hold Exemestane for 3 months. If her heart function improves on her next ECHO in July, then we will change her to anastrozole or continue holding antiestrogen therapy.  -continue surveillance.  Next Mammogram in 04/2021.  -F/u in 3 months.    2. Genetic Testing was negative for pathogenetic mutations.   3. Bone Health  -Her 08/13/17 DEXA was normal with lowest T-score -0.5 at AP spine.  -Her 11/03/19 DEXA was normal (T-score -0.6 at AP spine). Mostly stable.  -Will continue to monitor on AI as this can weaken her bone.   4. Comorbidities: Arthritis, DM, Anxiety recent severe non-ischemia CHF -She has lost her husband and daughter in recent years. She is currently on Valium daily managed by Dr. Lorelei Pont -She notes she was diagnosed with CHF in 04/2020 (non-ischemic cardiomyopathy) with unknown etiology. Her 04/22/20 ECHO showed EF 25%. She required cardiac cath on 06/21/20. She notes she has been symptomatic with SOB.  -She was started on several cardiac medications. 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 She had planned to have surgery before CHF diagnosis. Now she is likely not eligible.  -I discussed her CHF is not known to stem  from Exemestane. However, I will look into this more. I suspect her CHF is from her COVID infection in 03/2021.  -I encouraged her to discuss Cardiac biopsy or MRI for more definitve diagnosis with her cardiologist.   [place] (+) in 03/2020 - she has recovered.    PLAN: -will stop exemestane due to her recent CHF -She will follow-up with cardiology and likely repeat echo in 2 to 3 months -Lab and F/u in 3 months    No problem-specific Assessment & Plan notes found for this encounter.   No orders of the defined types were placed in this encounter.  All questions were answered. The patient knows to call the clinic with any problems, questions or concerns. No barriers to learning was detected. The total time spent in the appointment was 30 minutes.     [name], MD 08/13/2020   I, Joslyn Devon, am acting as scribe for [name], MD.   I have reviewed the above documentation for accuracy and completeness, and I agree with the above.

## 2020-08-11 LAB — BASIC METABOLIC PANEL
BUN/Creatinine Ratio: 13 (ref 9–23)
BUN: 8 mg/dL (ref 6–24)
CO2: 24 mmol/L (ref 20–29)
Calcium: 9.7 mg/dL (ref 8.7–10.2)
Chloride: 102 mmol/L (ref 96–106)
Creatinine, Ser: 0.61 mg/dL (ref 0.57–1.00)
Glucose: 103 mg/dL — ABNORMAL HIGH (ref 65–99)
Potassium: 4.6 mmol/L (ref 3.5–5.2)
Sodium: 139 mmol/L (ref 134–144)
eGFR: 104 mL/min/{1.73_m2} (ref >59)

## 2020-08-11 LAB — LIPID PANEL
Chol/HDL Ratio: 3.5 ratio (ref 0.0–4.4)
Cholesterol, Total: 128 mg/dL (ref 100–199)
HDL: 37 mg/dL — ABNORMAL LOW (ref >39)
LDL Chol Calc (NIH): 65 mg/dL (ref 0–99)
Triglycerides: 153 mg/dL — ABNORMAL HIGH (ref 0–149)
VLDL Cholesterol Cal: 26 mg/dL (ref 5–40)

## 2020-08-13 ENCOUNTER — Inpatient Hospital Stay: Payer: No Typology Code available for payment source | Attending: Hematology | Admitting: Hematology

## 2020-08-13 ENCOUNTER — Other Ambulatory Visit: Payer: Self-pay

## 2020-08-13 ENCOUNTER — Inpatient Hospital Stay: Payer: No Typology Code available for payment source

## 2020-08-13 ENCOUNTER — Encounter: Payer: Self-pay | Admitting: Hematology

## 2020-08-13 ENCOUNTER — Telehealth: Payer: Self-pay | Admitting: Hematology

## 2020-08-13 VITALS — BP 139/81 | HR 93 | Temp 97.8°F | Resp 17 | Wt 156.4 lb

## 2020-08-13 DIAGNOSIS — Z170 Estrogen receptor positive status [ER+]: Secondary | ICD-10-CM

## 2020-08-13 DIAGNOSIS — C50411 Malignant neoplasm of upper-outer quadrant of right female breast: Secondary | ICD-10-CM

## 2020-08-13 DIAGNOSIS — M1610 Unilateral primary osteoarthritis, unspecified hip: Secondary | ICD-10-CM | POA: Diagnosis not present

## 2020-08-13 DIAGNOSIS — I509 Heart failure, unspecified: Secondary | ICD-10-CM | POA: Insufficient documentation

## 2020-08-13 DIAGNOSIS — M1990 Unspecified osteoarthritis, unspecified site: Secondary | ICD-10-CM | POA: Insufficient documentation

## 2020-08-13 DIAGNOSIS — Z8616 Personal history of COVID-19: Secondary | ICD-10-CM | POA: Insufficient documentation

## 2020-08-13 DIAGNOSIS — F419 Anxiety disorder, unspecified: Secondary | ICD-10-CM | POA: Insufficient documentation

## 2020-08-13 DIAGNOSIS — E119 Type 2 diabetes mellitus without complications: Secondary | ICD-10-CM | POA: Diagnosis not present

## 2020-08-13 DIAGNOSIS — Z79811 Long term (current) use of aromatase inhibitors: Secondary | ICD-10-CM | POA: Insufficient documentation

## 2020-08-13 DIAGNOSIS — D500 Iron deficiency anemia secondary to blood loss (chronic): Secondary | ICD-10-CM

## 2020-08-13 LAB — CBC WITH DIFFERENTIAL (CANCER CENTER ONLY)
Abs Immature Granulocytes: 0.03 10*3/uL (ref 0.00–0.07)
Basophils Absolute: 0.1 10*3/uL (ref 0.0–0.1)
Basophils Relative: 1 %
Eosinophils Absolute: 0.4 10*3/uL (ref 0.0–0.5)
Eosinophils Relative: 5 %
HCT: 38.6 % (ref 36.0–46.0)
Hemoglobin: 12.9 g/dL (ref 12.0–15.0)
Immature Granulocytes: 0 %
Lymphocytes Relative: 32 %
Lymphs Abs: 2.4 10*3/uL (ref 0.7–4.0)
MCH: 29.7 pg (ref 26.0–34.0)
MCHC: 33.4 g/dL (ref 30.0–36.0)
MCV: 88.7 fL (ref 80.0–100.0)
Monocytes Absolute: 0.8 10*3/uL (ref 0.1–1.0)
Monocytes Relative: 11 %
Neutro Abs: 3.8 10*3/uL (ref 1.7–7.7)
Neutrophils Relative %: 51 %
Platelet Count: 394 10*3/uL (ref 150–400)
RBC: 4.35 MIL/uL (ref 3.87–5.11)
RDW: 12.4 % (ref 11.5–15.5)
WBC Count: 7.5 10*3/uL (ref 4.0–10.5)
nRBC: 0 % (ref 0.0–0.2)

## 2020-08-13 LAB — CMP (CANCER CENTER ONLY)
ALT: 13 U/L (ref 0–44)
AST: 14 U/L — ABNORMAL LOW (ref 15–41)
Albumin: 4.4 g/dL (ref 3.5–5.0)
Alkaline Phosphatase: 72 U/L (ref 38–126)
Anion gap: 10 (ref 5–15)
BUN: 6 mg/dL (ref 6–20)
CO2: 26 mmol/L (ref 22–32)
Calcium: 9.7 mg/dL (ref 8.9–10.3)
Chloride: 103 mmol/L (ref 98–111)
Creatinine: 0.69 mg/dL (ref 0.44–1.00)
GFR, Estimated: >60 mL/min (ref >60)
Glucose, Bld: 112 mg/dL — ABNORMAL HIGH (ref 70–99)
Potassium: 4.4 mmol/L (ref 3.5–5.1)
Sodium: 139 mmol/L (ref 135–145)
Total Bilirubin: 1 mg/dL (ref 0.3–1.2)
Total Protein: 7.1 g/dL (ref 6.5–8.1)

## 2020-08-13 LAB — IRON AND TIBC
Iron: 108 ug/dL (ref 41–142)
Saturation Ratios: 31 % (ref 21–57)
TIBC: 344 ug/dL (ref 236–444)
UIBC: 236 ug/dL (ref 120–384)

## 2020-08-13 LAB — VITAMIN B12: Vitamin B-12: 553 pg/mL (ref 180–914)

## 2020-08-13 LAB — FERRITIN: Ferritin: 65 ng/mL (ref 11–307)

## 2020-08-13 NOTE — Telephone Encounter (Signed)
Scheduled follow-up appointment per 4/29 los. Patient is aware.

## 2020-08-16 ENCOUNTER — Telehealth: Payer: Self-pay

## 2020-08-16 ENCOUNTER — Encounter: Payer: Self-pay | Admitting: Family Medicine

## 2020-08-16 DIAGNOSIS — M1610 Unilateral primary osteoarthritis, unspecified hip: Secondary | ICD-10-CM

## 2020-08-16 MED ORDER — HYDROCODONE-ACETAMINOPHEN 5-325 MG PO TABS: 1.0000 | ORAL_TABLET | Freq: Four times a day (QID) | ORAL | 0 refills | Status: DC | PRN

## 2020-08-16 NOTE — Telephone Encounter (Signed)
Patient notified of test results 

## 2020-08-16 NOTE — Telephone Encounter (Signed)
-----   Message from Park Liter, MD sent at 08/16/2020 11:18 AM EDT ----- Chem-7 looks good, continue present management

## 2020-08-19 DIAGNOSIS — F419 Anxiety disorder, unspecified: Secondary | ICD-10-CM | POA: Insufficient documentation

## 2020-08-24 ENCOUNTER — Encounter: Payer: Self-pay | Admitting: Hematology

## 2020-08-24 ENCOUNTER — Ambulatory Visit (INDEPENDENT_AMBULATORY_CARE_PROVIDER_SITE_OTHER): Payer: No Typology Code available for payment source | Admitting: Cardiology

## 2020-08-24 ENCOUNTER — Other Ambulatory Visit: Payer: Self-pay

## 2020-08-24 ENCOUNTER — Encounter: Payer: Self-pay | Admitting: Cardiology

## 2020-08-24 VITALS — BP 114/72 | HR 88 | Ht 63.5 in | Wt 157.0 lb

## 2020-08-24 DIAGNOSIS — C50411 Malignant neoplasm of upper-outer quadrant of right female breast: Secondary | ICD-10-CM | POA: Diagnosis not present

## 2020-08-24 DIAGNOSIS — I2510 Atherosclerotic heart disease of native coronary artery without angina pectoris: Secondary | ICD-10-CM

## 2020-08-24 DIAGNOSIS — E119 Type 2 diabetes mellitus without complications: Secondary | ICD-10-CM

## 2020-08-24 DIAGNOSIS — Z170 Estrogen receptor positive status [ER+]: Secondary | ICD-10-CM

## 2020-08-24 DIAGNOSIS — I420 Dilated cardiomyopathy: Secondary | ICD-10-CM

## 2020-08-24 MED ORDER — CARVEDILOL 12.5 MG PO TABS: 12.5000 mg | ORAL_TABLET | Freq: Two times a day (BID) | ORAL | 2 refills | Status: DC

## 2020-08-24 NOTE — Progress Notes (Signed)
Cardiology Office Note:    Date:  08/24/2020   ID:  Erin Fields, DOB 07-Aug-1965, MRN [id number]  PCP:  Darreld Mclean, MD  Cardiologist:  Jenne Campus, MD    Referring MD: Darreld Mclean, MD   Chief Complaint  Patient presents with  . Follow-up  I am doing fine, but the hip is still bothering me  History of Present Illness:    Erin Fields is a 55 y.o. female I met her in January.  She was getting ready to have a elective hip replacement surgery, EKG was done which was abnormal and after that she had echocardiogram done which showed severely reduced left ventricle ejection fraction of about 25% which was surprising.  Her past medical history is also significant for essential hypertension, dyslipidemia, diabetes.  As a part of work-up for her cardiomyopathy she had cardiac catheterization done which showed 25% of RCA, 20% intermediate branch and 40% diagonal 1 branch disease.  Obviously this degree of coronary artery disease does not explain her cardiomyopathy.  She did have some iron studies done which were negative.  She was put on appropriate medical therapy in spite of that there is no improvement left ventricle ejection fraction.  She comes today to talk about that.  She still complain of having some exertional shortness of breath but will limit her ability to exercise is her hip problem.  It hurts.  She does not have paroxysmal nocturnal dyspnea there is no swelling of lower extremities.  Past Medical History:  Diagnosis Date  . Allergic rhinitis due to animal (cat) (dog) hair and dander 04/27/2020  . Allergic rhinitis due to pollen 04/27/2020  . Anxiety   . Anxiety and depression 01/20/2018  . Asthma   . Asthma, persistent controlled 10/05/2018  . B12 deficiency 11/27/2019  . Borderline diabetes 12/17/2015  . Cancer [facility]) 2019   right breast cancer  . CERVICAL CANCER, HX OF 12/06/2006   Qualifier: Diagnosis of  By: Arnoldo Morale MD, Balinda Quails   . Chronic sinusitis  01/26/2015  . Chronic venous insufficiency 09/02/2015  . Colon polyps   . Congestive heart failure ([place]) 04/29/2020  . Controlled type 2 diabetes mellitus without complication, without long-term current use of insulin ([place]) 05/28/2017  . Coronary artery disease 25% of RCA, 20% intermediate branch, 40% diagonal branch based on cardiac cath from 2022 07/22/2020  . Crohn disease ([place]) 11/29/2016  . Depression   . Diabetes mellitus without complication ([place])    type 2  . Dilated cardiomyopathy ([place]) ejection fraction 25% by echocardiogram from January 2022 04/29/2020  . Dyspnea 09/15/2014  . Elevated triglycerides with high cholesterol 11/26/2013  . Environmental allergies   . Eustachian tube dysfunction 12/04/2014  . Ex-smoker 11/18/2015  . Family history of breast cancer   . Genetic testing 07/16/2017   Negative genetic testing on the multi-cancer panel.  The Multi-Gene Panel offered by [name] includes sequencing and/or deletion duplication testing of the following 83 genes: ALK, APC, ATM, AXIN2,BAP1,  BARD1, BLM, BMPR1A, BRCA1, BRCA2, BRIP1, CASR, CDC73, CDH1, CDK4, CDKN1B, CDKN1C, CDKN2A (p14ARF), CDKN2A (p16INK4a), CEBPA, CHEK2, CTNNA1, DICER1, DIS3L2, EGFR (c.2369C>T, p.Thr790Met variant onl  . GERD (gastroesophageal reflux disease)   . HEADACHE 12/06/2006   Qualifier: Diagnosis of  By: Arnoldo Morale MD, Balinda Quails   . History of adenomatous polyp of colon 11/19/2017  . Hyperlipidemia   . Hypertension   . Hypothyroidism   . IDA (iron deficiency anemia) 11/06/2019  . Impaired fasting glucose 12/12/2017  .  Insomnia 08/20/2013  . Irritable bowel syndrome 11/29/2006   Qualifier: Diagnosis of  By: Arnoldo Morale MD, John E   . Left sided abdominal pain 12/13/2017  . Liver hemangioma 08/24/2014  . Malignant neoplasm of upper-outer quadrant of right breast in female, estrogen receptor positive ([place]) 03/20/2017  . Migraines   . Mild persistent asthma, uncomplicated 2/35/3614  . Nasal polyp, unspecified 01/10/2018  . Oral herpes  04/20/2013  . Other allergic rhinitis 09/15/2014  . Personal history of radiation therapy 2019   36 radiation tx  . PONV (postoperative nausea and vomiting)    scopolamine patch helps  . Recurrent infections 10/05/2018  . Right upper lobe pulmonary nodule 01/10/2018  . Symptomatic varicose veins, right 09/02/2015  . Ulcerative colitis ([place])   . Varicose veins     Past Surgical History:  Procedure Laterality Date  . BREAST BIOPSY Right 02/2017  . BREAST BIOPSY Right 11/07/2019  . BREAST LUMPECTOMY Right 04/24/2017  . BREAST LUMPECTOMY WITH RADIOACTIVE SEED AND SENTINEL LYMPH NODE BIOPSY Right 04/24/2017   Procedure: RIGHT BREAST LUMPECTOMY WITH RADIOACTIVE SEED AND SENTINEL LYMPH NODE BIOPSY;  Surgeon: [name], MD;  Location: [place];  Service: General;  Laterality: Right;  . crapal tunnel relase Left 05/2018  . ENDOMETRIAL ABLATION  2010   Had abnormal uterine bleeding (heavy and frequent), performed in [place], menses stopped completely 2 years after  . EXPLORATORY LAPAROTOMY     Had exploratory surgery at the age of 16 for abdominal pain with cyst found on her left ovary, not treated surgically  . FOOT SURGERY Right 2012  . HAND SURGERY Right   . RE-EXCISION OF BREAST LUMPECTOMY Right 05/15/2017   Procedure: RE-EXCISION OF BREAST LUMPECTOMY;  Surgeon: [name], MD;  Location: [place];  Service: General;  Laterality: Right;  . RIGHT/LEFT HEART CATH AND CORONARY ANGIOGRAPHY N/A 06/21/2020   Procedure: RIGHT/LEFT HEART CATH AND CORONARY ANGIOGRAPHY;  Surgeon: Nelva Bush, MD;  Location: [place] [facility];  Service: Cardiovascular;  Laterality: N/A;  . ROBOTIC ASSISTED SALPINGO OOPHERECTOMY Bilateral 08/29/2019   Procedure: XI ROBOTIC ASSISTED SALPINGO OOPHORECTOMY;  Surgeon: [name], MD;  Location: [facility];  Service: Gynecology;  Laterality: Bilateral;  . Septoplasty with turbinate reduction    . TONSILLECTOMY   1980  . VEIN SURGERY     Removal Right Leg  . WISDOM TOOTH EXTRACTION      Current Medications: Current Meds  Medication Sig  . acyclovir (ZOVIRAX) 400 MG tablet TAKE 1 TABLET (400 MG TOTAL) BY MOUTH 3 (THREE) TIMES DAILY AS NEEDED. (Patient taking differently: Take 400 mg by mouth 3 (three) times daily as needed (cold sores).)  . Adalimumab 80 MG/0.8ML PNKT Inject 80 mg into the skin every 14 (fourteen) days.  Marland Kitchen ascorbic acid (VITAMIN C) 500 MG tablet Take 500 mg by mouth daily.  . Azelastine HCl 0.15 % SOLN Place 2 sprays into both nostrils daily. For 30 days  . budesonide-formoterol (SYMBICORT) 160-4.5 MCG/ACT inhaler Inhale 2 puffs into the lungs 2 (two) times daily.  . calcium carbonate (OS-CAL) 600 MG TABS tablet Take 600 mg by mouth daily with breakfast.  . carvedilol (COREG) 6.25 MG tablet Take 1 tablet (6.25 mg total) by mouth 2 (two) times daily.  . cetirizine (ZYRTEC) 10 MG tablet Take 10 mg by mouth daily.  . Cholecalciferol (VITAMIN D) 125 MCG (5000 UT) CAPS Take 5,000 Units by mouth daily.  Marland Kitchen EPINEPHrine 0.3 mg/0.3 mL IJ SOAJ injection  Inject 0.3 mg into the muscle as needed for anaphylaxis.  Marland Kitchen HYDROcodone-acetaminophen (NORCO/VICODIN) 5-325 MG tablet Take 1 tablet by mouth every 6 (six) hours as needed for up to 20 doses for severe pain or moderate pain.  Marland Kitchen Hyoscyamine Sulfate 0.375 MG TBCR Take 0.375 mg by mouth daily.  . lansoprazole (PREVACID SOLUTAB) 15 MG disintegrating tablet Take 15 mg by mouth daily.  Marland Kitchen levalbuterol (XOPENEX HFA) 45 MCG/ACT inhaler Inhale 2 puffs into the lungs every 4 (four) hours as needed for wheezing (Q4-6 Hours PRN).  Marland Kitchen levothyroxine (SYNTHROID) 88 MCG tablet Take 1 tablet (88 mcg total) by mouth daily before breakfast.  . MAGNESIUM PO Take 250 mg by mouth daily.  . meloxicam (MOBIC) 15 MG tablet Take 15 mg by mouth daily as needed for pain.  . Menthol, Topical Analgesic, (BIOFREEZE COLORLESS) 4 % GEL Apply 1 application topically daily as needed  (Hip pain).  . mesalamine (LIALDA) 1.2 g EC tablet Take 2.4 g by mouth in the morning and at bedtime.   . metFORMIN (GLUCOPHAGE) 500 MG tablet Take 1 tablet (500 mg total) by mouth 2 (two) times daily with a meal.  . montelukast (SINGULAIR) 10 MG tablet Take 10 mg by mouth at bedtime.   . promethazine (PHENERGAN) 25 MG tablet Take 0.5-1 tablets (12.5-25 mg total) by mouth daily as needed for nausea or vomiting.  . rosuvastatin (CRESTOR) 20 MG tablet Take 1 tablet (20 mg total) by mouth daily. (Patient taking differently: Take 20 mg by mouth at bedtime.)  . sacubitril-valsartan (ENTRESTO) 49-51 MG Take 1 tablet by mouth 2 (two) times daily.  Marland Kitchen spironolactone (ALDACTONE) 25 MG tablet Take 0.5 tablets (12.5 mg total) by mouth daily.  Marland Kitchen triamcinolone (NASACORT) 55 MCG/ACT AERO nasal inhaler Place 2 sprays into the nose daily.  Marland Kitchen venlafaxine XR (EFFEXOR-XR) 150 MG 24 hr capsule Take 1 capsule (150 mg total) by mouth daily with breakfast.  . vitamin B-12 (CYANOCOBALAMIN) 1000 MCG tablet Take 1,000 mcg by mouth daily.  Marland Kitchen zinc gluconate 50 MG tablet Take 50 mg by mouth daily.  Marland Kitchen zolpidem (AMBIEN) 5 MG tablet TAKE 1 TABLET BY MOUTH AT BEDTIME AS NEEDED FOR SLEEP (Patient taking differently: Take 5 mg by mouth at bedtime.)     Allergies:   Losartan potassium, Ace inhibitors, Aspirin, Cefaclor, Oxycodone, Sulfa antibiotics, Lisinopril, and Other   Social History   Socioeconomic History  . Marital status: Widowed    Spouse name: Not on file  . Number of children: Not on file  . Years of education: Not on file  . Highest education level: Not on file  Occupational History  . Not on file  Tobacco Use  . Smoking status: Former Smoker    Packs/day: 3.00    Years: 15.00    Pack years: 45.00    Types: Cigarettes    Quit date: 09/14/1993    Years since quitting: 26.9  . Smokeless tobacco: Never Used  . Tobacco comment: Quit 26 yrs ago  Vaping Use  . Vaping Use: Never used  Substance and Sexual  Activity  . Alcohol use: Yes    Comment: Drinks a couple every 2-3 months  . Drug use: No  . Sexual activity: Not Currently    Comment: ablation  Other Topics Concern  . Not on file  Social History Narrative  . Not on file   Social Determinants of Health   Financial Resource Strain: Not on file  Food Insecurity: Not on file  Transportation  Needs: Not on file  Physical Activity: Not on file  Stress: Not on file  Social Connections: Not on file     Family History: The patient's family history includes Arthritis (age of onset: 55) in her mother; Brain cancer in her father; Breast cancer in her mother; Colitis in her sister; Crohn's disease in her mother; Diabetes in her maternal grandfather and mother; Heart attack in her maternal grandfather; Heart disease in her maternal grandfather and mother; Hyperlipidemia in her mother; Hypertension in her mother; Leukemia in her maternal grandfather; Leukemia (age of onset: 28) in her cousin; Lung cancer in her father and mother. ROS:   Please see the history of present illness.    All 14 point review of systems negative except as described per history of present illness  EKGs/Labs/Other Studies Reviewed:      Recent Labs: 08/13/2020: ALT 13; BUN 6; Creatinine 0.69; Hemoglobin 12.9; Platelet Count 394; Potassium 4.4; Sodium 139  Recent Lipid Panel    Component Value Date/Time   CHOL 128 08/10/2020 0936   TRIG 153 (H) 08/10/2020 0936   HDL 37 (L) 08/10/2020 0936   CHOLHDL 3.5 08/10/2020 0936   CHOLHDL 4 06/18/2019 0952   VLDL 31.8 06/18/2019 0952   LDLCALC 65 08/10/2020 0936   LDLDIRECT 101.0 12/02/2018 0955    Physical Exam:    VS:  BP 114/72 (BP Location: Right Arm, Patient Position: Sitting)   Pulse 88   Ht 5' 3.5" (1.613 m)   Wt 157 lb (71.2 kg)   SpO2 96%   BMI 27.38 kg/m     Wt Readings from Last 3 Encounters:  08/24/20 157 lb (71.2 kg)  08/13/20 156 lb 6.4 oz (70.9 kg)  07/22/20 156 lb (70.8 kg)     GEN:  Well  nourished, well developed in no acute distress HEENT: Normal NECK: No JVD; No carotid bruits LYMPHATICS: No lymphadenopathy CARDIAC: RRR, no murmurs, no rubs, no gallops RESPIRATORY:  Clear to auscultation without rales, wheezing or rhonchi  ABDOMEN: Soft, non-tender, non-distended MUSCULOSKELETAL:  No edema; No deformity  SKIN: Warm and dry LOWER EXTREMITIES: no swelling NEUROLOGIC:  Alert and oriented x 3 PSYCHIATRIC:  Normal affect   ASSESSMENT:    1. Dilated cardiomyopathy (HCC) ejection fraction 25% by echocardiogram from January 2022   2. Coronary artery disease involving native coronary artery of native heart without angina pectoris   3. Malignant neoplasm of upper-outer quadrant of right breast in female, estrogen receptor positive ([place])   4. Diabetes mellitus without complication ([place])    PLAN:    In order of problems listed above:  1. Dilated cardiomyopathy which is nonischemic with ejection fraction close to 25%.  She is already on appropriate dose of Entresto.  Today I will do EKG and if EKG is fine will increase dose of carvedilol to 12.5 twice daily, she is already on Aldactone which I will continue.  Her potassium is 4.4 and her kidney function is normal with creatinine of 2.69.  Neck step will be to add [place] or [place].  Also, sees rehab more than 3 months if effective therapy without improvement left ventricle ejection fraction ICD need to be considered.  I will refer her to our EP clinic for consideration of this device.  I explained to her the purpose of it and why it is done and how it is done.  She agreed to proceed with consultation by EP and potential ICD implant.  It will be also reasonable to send her to  our advanced congestive heart failure clinic.  She may require MRI trying to clarify the nature and etiology of her cardiomyopathy. 2.  Coronary disease nonobstructive.  She is on aspirin which I will make sure that she will take it, her fasting lipid profile  LDL 65 HDL 37.  This is on Crestor 20 which I will continue. 3.  Diabetes her last hemoglobin A1c was 6.3. 4.  Congestive heart failure: She is compensated.  Her New [place] Heart Association is probably best graded as 2.  However that assessment is difficult because of hip problem that she has.  Medication Adjustments/Labs and Tests Ordered: Current medicines are reviewed at length with the patient today.  Concerns regarding medicines are outlined above.  No orders of the defined types were placed in this encounter.  Medication changes: No orders of the defined types were placed in this encounter.   Signed, Park Liter, MD, [facility] 08/24/2020 10:00 AM    [place]

## 2020-08-24 NOTE — Addendum Note (Signed)
Addended by: Senaida Ores on: 08/24/2020 10:14 AM   Modules accepted: Orders

## 2020-08-24 NOTE — Patient Instructions (Signed)
Medication Instructions:  Your physician has recommended you make the following change in your medication:  INCREASE: Carvedilol to 12.5 mg twice daily   *If you need a refill on your cardiac medications before your next appointment, please call your pharmacy*   Lab Work: None If you have labs (blood work) drawn today and your tests are completely normal, you will receive your results only by: Marland Kitchen MyChart Message (if you have MyChart) OR . A paper copy in the mail If you have any lab test that is abnormal or we need to change your treatment, we will call you to review the results.   Testing/Procedures: None   Follow-Up: At [facility], you and your health needs are our priority.  As part of our continuing mission to provide you with exceptional heart care, we have created designated Provider Care Teams.  These Care Teams include your primary Cardiologist (physician) and Advanced Practice Providers (APPs -  Physician Assistants and Nurse Practitioners) who all work together to provide you with the care you need, when you need it.  We recommend signing up for the patient portal called "MyChart".  Sign up information is provided on this After Visit Summary.  MyChart is used to connect with patients for Virtual Visits (Telemedicine).  Patients are able to view lab/test results, encounter notes, upcoming appointments, etc.  Non-urgent messages can be sent to your provider as well.   To learn more about what you can do with MyChart, go to [email or web address].    Your next appointment:   2 month(s)  The format for your next appointment:   In Person  Provider:   Jenne Campus, MD   Other Instructions

## 2020-08-26 ENCOUNTER — Institutional Professional Consult (permissible substitution): Payer: No Typology Code available for payment source | Admitting: Cardiology

## 2020-08-26 NOTE — Progress Notes (Deleted)
Electrophysiology Office Note   Date:  08/26/2020   ID:  Erin Fields, DOB 1966-01-05, MRN [id number]  PCP:  Darreld Mclean, MD  Cardiologist:  Agustin Cree Primary Electrophysiologist:  Ran Tullis Meredith Leeds, MD    Chief Complaint: CHF   History of Present Illness: Erin Fields is a 55 y.o. female who is being seen today for the evaluation of CHF at the request of Park Liter, MD. Presenting today for electrophysiology evaluation.  She has a history significant for hypertension, hyperlipidemia, and diabetes..  She has multiple medical problems.  She was getting ready for elective hip replacement surgery.  An ECG was done and was found to be abnormal.  She had an echo that showed an ejection fraction of 25%.  She had a left heart catheterization that showed a 25% RCA, 25% intermediate branch, and 40% diagonal 1.  She was put on optimal medical therapy and her echo was repeated which showed a stable ejection fraction.  Today, she denies*** symptoms of palpitations, chest pain, shortness of breath, orthopnea, PND, lower extremity edema, claudication, dizziness, presyncope, syncope, bleeding, or neurologic sequela. The patient is tolerating medications without difficulties.    Past Medical History:  Diagnosis Date  . Allergic rhinitis due to animal (cat) (dog) hair and dander 04/27/2020  . Allergic rhinitis due to pollen 04/27/2020  . Anxiety   . Anxiety and depression 01/20/2018  . Asthma   . Asthma, persistent controlled 10/05/2018  . B12 deficiency 11/27/2019  . Borderline diabetes 12/17/2015  . Cancer [facility]) 2019   right breast cancer  . CERVICAL CANCER, HX OF 12/06/2006   Qualifier: Diagnosis of  By: Arnoldo Morale MD, Balinda Quails   . Chronic sinusitis 01/26/2015  . Chronic venous insufficiency 09/02/2015  . Colon polyps   . Congestive heart failure ([place]) 04/29/2020  . Controlled type 2 diabetes mellitus without complication, without long-term current use of insulin ([place])  05/28/2017  . Coronary artery disease 25% of RCA, 20% intermediate branch, 40% diagonal branch based on cardiac cath from 2022 07/22/2020  . Crohn disease ([place]) 11/29/2016  . Depression   . Diabetes mellitus without complication ([place])    type 2  . Dilated cardiomyopathy ([place]) ejection fraction 25% by echocardiogram from January 2022 04/29/2020  . Dyspnea 09/15/2014  . Elevated triglycerides with high cholesterol 11/26/2013  . Environmental allergies   . Eustachian tube dysfunction 12/04/2014  . Ex-smoker 11/18/2015  . Family history of breast cancer   . Genetic testing 07/16/2017   Negative genetic testing on the multi-cancer panel.  The Multi-Gene Panel offered by [name] includes sequencing and/or deletion duplication testing of the following 83 genes: ALK, APC, ATM, AXIN2,BAP1,  BARD1, BLM, BMPR1A, BRCA1, BRCA2, BRIP1, CASR, CDC73, CDH1, CDK4, CDKN1B, CDKN1C, CDKN2A (p14ARF), CDKN2A (p16INK4a), CEBPA, CHEK2, CTNNA1, DICER1, DIS3L2, EGFR (c.2369C>T, p.Thr790Met variant onl  . GERD (gastroesophageal reflux disease)   . HEADACHE 12/06/2006   Qualifier: Diagnosis of  By: Arnoldo Morale MD, Balinda Quails   . History of adenomatous polyp of colon 11/19/2017  . Hyperlipidemia   . Hypertension   . Hypothyroidism   . IDA (iron deficiency anemia) 11/06/2019  . Impaired fasting glucose 12/12/2017  . Insomnia 08/20/2013  . Irritable bowel syndrome 11/29/2006   Qualifier: Diagnosis of  By: Arnoldo Morale MD, John E   . Left sided abdominal pain 12/13/2017  . Liver hemangioma 08/24/2014  . Malignant neoplasm of upper-outer quadrant of right breast in female, estrogen receptor positive ([place]) 03/20/2017  . Migraines   .  Mild persistent asthma, uncomplicated 10/30/9676  . Nasal polyp, unspecified 01/10/2018  . Oral herpes 04/20/2013  . Other allergic rhinitis 09/15/2014  . Personal history of radiation therapy 2019   36 radiation tx  . PONV (postoperative nausea and vomiting)    scopolamine patch helps  . Recurrent infections 10/05/2018  .  Right upper lobe pulmonary nodule 01/10/2018  . Symptomatic varicose veins, right 09/02/2015  . Ulcerative colitis ([place])   . Varicose veins    Past Surgical History:  Procedure Laterality Date  . BREAST BIOPSY Right 02/2017  . BREAST BIOPSY Right 11/07/2019  . BREAST LUMPECTOMY Right 04/24/2017  . BREAST LUMPECTOMY WITH RADIOACTIVE SEED AND SENTINEL LYMPH NODE BIOPSY Right 04/24/2017   Procedure: RIGHT BREAST LUMPECTOMY WITH RADIOACTIVE SEED AND SENTINEL LYMPH NODE BIOPSY;  Surgeon: [name], MD;  Location: [facility];  Service: General;  Laterality: Right;  . crapal tunnel relase Left 05/2018  . ENDOMETRIAL ABLATION  2010   Had abnormal uterine bleeding (heavy and frequent), performed in [place], menses stopped completely 2 years after  . EXPLORATORY LAPAROTOMY     Had exploratory surgery at the age of 71 for abdominal pain with cyst found on her left ovary, not treated surgically  . FOOT SURGERY Right 2012  . HAND SURGERY Right   . RE-EXCISION OF BREAST LUMPECTOMY Right 05/15/2017   Procedure: RE-EXCISION OF BREAST LUMPECTOMY;  Surgeon: [name], MD;  Location: [facility];  Service: General;  Laterality: Right;  . RIGHT/LEFT HEART CATH AND CORONARY ANGIOGRAPHY N/A 06/21/2020   Procedure: RIGHT/LEFT HEART CATH AND CORONARY ANGIOGRAPHY;  Surgeon: Nelva Bush, MD;  Location: [facility];  Service: Cardiovascular;  Laterality: N/A;  . ROBOTIC ASSISTED SALPINGO OOPHERECTOMY Bilateral 08/29/2019   Procedure: XI ROBOTIC ASSISTED SALPINGO OOPHORECTOMY;  Surgeon: [name], MD;  Location: [facility];  Service: Gynecology;  Laterality: Bilateral;  . Septoplasty with turbinate reduction    . TONSILLECTOMY  1980  . VEIN SURGERY     Removal Right Leg  . WISDOM TOOTH EXTRACTION       Current Outpatient Medications  Medication Sig Dispense Refill  . acyclovir (ZOVIRAX) 400 MG tablet TAKE 1 TABLET (400 MG TOTAL) BY MOUTH 3  (THREE) TIMES DAILY AS NEEDED. (Patient taking differently: Take 400 mg by mouth 3 (three) times daily as needed (cold sores).) 270 tablet 1  . Adalimumab 80 MG/0.8ML PNKT Inject 80 mg into the skin every 14 (fourteen) days.    Marland Kitchen ascorbic acid (VITAMIN C) 500 MG tablet Take 500 mg by mouth daily.    . Azelastine HCl 0.15 % SOLN Place 2 sprays into both nostrils daily. For 30 days    . budesonide-formoterol (SYMBICORT) 160-4.5 MCG/ACT inhaler Inhale 2 puffs into the lungs 2 (two) times daily. 1 each 3  . calcium carbonate (OS-CAL) 600 MG TABS tablet Take 600 mg by mouth daily with breakfast.    . carvedilol (COREG) 12.5 MG tablet Take 1 tablet (12.5 mg total) by mouth 2 (two) times daily. 60 tablet 2  . cetirizine (ZYRTEC) 10 MG tablet Take 10 mg by mouth daily.    . Cholecalciferol (VITAMIN D) 125 MCG (5000 UT) CAPS Take 5,000 Units by mouth daily.    . diphenoxylate-atropine (LOMOTIL) 2.5-0.025 MG tablet Take 1 tablet by mouth 4 (four) times daily as needed for diarrhea or loose stools.    Marland Kitchen EPINEPHrine 0.3 mg/0.3 mL IJ SOAJ injection Inject 0.3 mg into the muscle as needed  for anaphylaxis.    Marland Kitchen HYDROcodone-acetaminophen (NORCO/VICODIN) 5-325 MG tablet Take 1 tablet by mouth every 6 (six) hours as needed for up to 20 doses for severe pain or moderate pain. 20 tablet 0  . Hyoscyamine Sulfate 0.375 MG TBCR Take 0.375 mg by mouth daily.    . lansoprazole (PREVACID SOLUTAB) 15 MG disintegrating tablet Take 15 mg by mouth daily.    Marland Kitchen levalbuterol (XOPENEX HFA) 45 MCG/ACT inhaler Inhale 2 puffs into the lungs every 4 (four) hours as needed for wheezing (Q4-6 Hours PRN).    Marland Kitchen levothyroxine (SYNTHROID) 88 MCG tablet Take 1 tablet (88 mcg total) by mouth daily before breakfast. 90 tablet 1  . MAGNESIUM PO Take 250 mg by mouth daily.    . meloxicam (MOBIC) 15 MG tablet Take 15 mg by mouth daily as needed for pain.    . Menthol, Topical Analgesic, (BIOFREEZE COLORLESS) 4 % GEL Apply 1 application topically  daily as needed (Hip pain).    . mesalamine (LIALDA) 1.2 g EC tablet Take 2.4 g by mouth in the morning and at bedtime.     . metFORMIN (GLUCOPHAGE) 500 MG tablet Take 1 tablet (500 mg total) by mouth 2 (two) times daily with a meal. 180 tablet 3  . montelukast (SINGULAIR) 10 MG tablet Take 10 mg by mouth at bedtime.     . promethazine (PHENERGAN) 25 MG tablet Take 0.5-1 tablets (12.5-25 mg total) by mouth daily as needed for nausea or vomiting. 30 tablet 5  . rosuvastatin (CRESTOR) 20 MG tablet Take 1 tablet (20 mg total) by mouth daily. (Patient taking differently: Take 20 mg by mouth at bedtime.) 90 tablet 3  . sacubitril-valsartan (ENTRESTO) 49-51 MG Take 1 tablet by mouth 2 (two) times daily. 60 tablet 3  . spironolactone (ALDACTONE) 25 MG tablet Take 0.5 tablets (12.5 mg total) by mouth daily. 30 tablet 1  . triamcinolone (NASACORT) 55 MCG/ACT AERO nasal inhaler Place 2 sprays into the nose daily.    Marland Kitchen venlafaxine XR (EFFEXOR-XR) 150 MG 24 hr capsule Take 1 capsule (150 mg total) by mouth daily with breakfast. 90 capsule 1  . vitamin B-12 (CYANOCOBALAMIN) 1000 MCG tablet Take 1,000 mcg by mouth daily.    Marland Kitchen zinc gluconate 50 MG tablet Take 50 mg by mouth daily.    Marland Kitchen zolpidem (AMBIEN) 5 MG tablet TAKE 1 TABLET BY MOUTH AT BEDTIME AS NEEDED FOR SLEEP (Patient taking differently: Take 5 mg by mouth at bedtime.) 30 tablet 2   No current facility-administered medications for this visit.    Allergies:   Losartan potassium, Ace inhibitors, Aspirin, Cefaclor, Oxycodone, Sulfa antibiotics, Lisinopril, and Other   Social History:  The patient  reports that she quit smoking about 26 years ago. Her smoking use included cigarettes. She has a 45.00 pack-year smoking history. She has never used smokeless tobacco. She reports current alcohol use. She reports that she does not use drugs.   Family History:  The patient's family history includes Arthritis (age of onset: 55) in her mother; Brain cancer in her  father; Breast cancer in her mother; Colitis in her sister; Crohn's disease in her mother; Diabetes in her maternal grandfather and mother; Heart attack in her maternal grandfather; Heart disease in her maternal grandfather and mother; Hyperlipidemia in her mother; Hypertension in her mother; Leukemia in her maternal grandfather; Leukemia (age of onset: 69) in her cousin; Lung cancer in her father and mother.    ROS:  Please see the history of  present illness.   Otherwise, review of systems is positive for none.   All other systems are reviewed and negative.    PHYSICAL EXAM: VS:  There were no vitals taken for this visit. , BMI There is no height or weight on file to calculate BMI. GEN: Well nourished, well developed, in no acute distress  HEENT: normal  Neck: no JVD, carotid bruits, or masses Cardiac: ***RRR; no murmurs, rubs, or gallops,no edema  Respiratory:  clear to auscultation bilaterally, normal work of breathing GI: soft, nontender, nondistended, + BS MS: no deformity or atrophy  Skin: warm and dry Neuro:  Strength and sensation are intact Psych: euthymic mood, full affect  EKG:  EKG is not ordered today. Personal review of the ekg ordered 08/24/20 shows sinus rhythm, rate 82  Recent Labs: 08/13/2020: ALT 13; BUN 6; Creatinine 0.69; Hemoglobin 12.9; Platelet Count 394; Potassium 4.4; Sodium 139    Lipid Panel     Component Value Date/Time   CHOL 128 08/10/2020 0936   TRIG 153 (H) 08/10/2020 0936   HDL 37 (L) 08/10/2020 0936   CHOLHDL 3.5 08/10/2020 0936   CHOLHDL 4 06/18/2019 0952   VLDL 31.8 06/18/2019 0952   LDLCALC 65 08/10/2020 0936   LDLDIRECT 101.0 12/02/2018 0955     Wt Readings from Last 3 Encounters:  08/24/20 157 lb (71.2 kg)  08/13/20 156 lb 6.4 oz (70.9 kg)  07/22/20 156 lb (70.8 kg)      Other studies Reviewed: Additional studies/ records that were reviewed today include: TTE 07/23/20  Review of the above records today demonstrates:  1. Left  ventricular ejection fraction, by estimation, is 20 to 25%. The  left ventricle has severely decreased function. The left ventricle  demonstrates global hypokinesis. The left ventricular internal cavity size  was mildly dilated. Left ventricular  diastolic parameters are indeterminate.  2. Right ventricular systolic function is normal. The right ventricular  size is normal.  3. The mitral valve is normal in structure. Mild mitral valve  regurgitation. No evidence of mitral stenosis.  4. The aortic valve is tricuspid. Aortic valve regurgitation is not  visualized. No aortic stenosis is present.  5. The inferior vena cava is normal in size with greater than 50%  respiratory variability, suggesting right atrial pressure of 3 mmHg.   LHC 06/21/20 1. Mild to moderate, non-obstructive coronary artery disease. 2. Normal left and right heart filling pressures. 3. Mildly reduced Fick cardiac output/index.     ASSESSMENT AND PLAN:  1.  Chronic systolic heart failure secondary to nonischemic cardiomyopathy: Currently on optimal medical therapy with Entresto, Aldactone, carvedilol.  2.  Coronary artery disease: Nonobstructive.  Fasting LDL of 65.  Continue Crestor per primary cardiology.    Current medicines are reviewed at length with the patient today.   The patient {ACTIONS; HAS/DOES NOT HAVE:19233} concerns regarding her medicines.  The following changes were made today:  {NONE DEFAULTED:18576::"none"}  Labs/ tests ordered today include: *** No orders of the defined types were placed in this encounter.    Disposition:   FU with Julietta Batterman {gen number [id number]} {Days to years:10300}  Signed, Eidan Muellner Meredith Leeds, MD  08/26/2020 8:28 AM     [facility] [address] [place] [address] [phone] (office) [phone] (fax)

## 2020-08-31 ENCOUNTER — Other Ambulatory Visit: Payer: Self-pay

## 2020-08-31 ENCOUNTER — Ambulatory Visit: Payer: No Typology Code available for payment source | Admitting: Cardiology

## 2020-08-31 ENCOUNTER — Encounter: Payer: Self-pay | Admitting: Cardiology

## 2020-08-31 ENCOUNTER — Telehealth: Payer: Self-pay | Admitting: Hematology

## 2020-08-31 VITALS — BP 142/76 | HR 80 | Ht 63.5 in | Wt 157.0 lb

## 2020-08-31 DIAGNOSIS — I5022 Chronic systolic (congestive) heart failure: Secondary | ICD-10-CM

## 2020-08-31 DIAGNOSIS — Z01818 Encounter for other preprocedural examination: Secondary | ICD-10-CM | POA: Diagnosis not present

## 2020-08-31 DIAGNOSIS — Z01812 Encounter for preprocedural laboratory examination: Secondary | ICD-10-CM

## 2020-08-31 NOTE — Patient Instructions (Addendum)
Implantable Device Instructions  You are scheduled for: Implantable cardiac defibrillator on 09/23/2020 with Dr. Curt Bears.  1.   Pre procedure testing-             A.  LAB WORK--- between 5/23 - 6/15  for your pre procedure blood work.  You do NOT need to be fasting.  2. On the day of your procedure 10/01/2020 you will go to [facility] [phone]) at 11:30 am.  Dennis Bast will go to the main entrance [address] [employer]) and enter where the [employer] are.  You will check in at ADMITTING.  You may have one support person come in to the hospital with you.  They will be asked to wait in the waiting room.   3.   Do not eat or drink after midnight prior to your procedure.   4.   On the morning of your procedure do NOT take any medication.  5.  The night before your procedure and the morning of your procedure scrub your neck/chest with surgical scrub.  See instruction letter below.   5.  Plan for an overnight stay, but you may be discharged home after your procedure. If you use your phone frequently bring your phone charger, in case you have to stay.  If you are discharged after your procedure you will need someone to drive you home and be with your for 24 hours after your procedure.   6.  You will follow up with the [facility] 10-14 days after your procedure. You will follow up with Dr. Curt Bears 91 days after your procedure.  These appointments will be made for you.   * If you have ANY questions after you get home, please call the office [phone] and ask for Johne Buckle RN or send a MyChart message.   [facility] - Preparing For Surgery  Before surgery, you can play an important role. Because skin is not sterile, your skin needs to be as free of germs as possible. You can reduce the number of germs on your skin by washing with CHG (chlorahexidine gluconate) Soap before surgery.  CHG is an antiseptic cleaner which kills germs and bonds with the skin to continue killing  germs even after washing.   Please do not use if you have an allergy to CHG or antibacterial soaps.  If your skin becomes reddened/irritated stop using the CHG.   Do not shave (including legs and underarms) for at least 48 hours prior to first CHG shower.  It is OK to shave your face.  Please follow these instructions carefully:  1.  Shower the night before surgery and the morning of surgery with CHG.  2.  If you choose to wash your hair, wash your hair first as usual with your normal shampoo.  3.  After you shampoo, rinse your hair and body thoroughly to remove the shampoo.  4.  Use CHG as you would any other liquid soap.  You can apply CHG directly to the skin and wash gently with a clean washcloth. 5.  Apply the CHG Soap to your body ONLY FROM THE NECK DOWN.  Do not use on open wounds or open sores.  Avoid contact with your eyes, ears, mouth and genitals (private parts).  Wash genitals (private parts) with your normal soap.  6.  Wash thoroughly, paying special attention to the area where your surgery will be performed.  7.  Thoroughly rinse your body with warm water  from the neck down.   8.  DO NOT shower/wash with your normal soap after using and rinsing off the CHG soap.  9.  Pat yourself dry with a clean towel.           10.  Wear clean pajamas.           11.  Place clean sheets on your bed the night of your first shower and do not sleep with pets.  Day of Surgery: Do not apply any deodorants/lotions.  Please wear clean clothes to the hospital/surgery center.     Cardioverter Defibrillator Implantation An implantable cardioverter defibrillator (ICD) is a device that identifies and corrects abnormal heart rhythms. Cardioverter defibrillator implantation is a surgery to place an ICD under the skin in the chest or abdomen. An ICD has a battery, a small computer (pulse generator), and wires (leads) that go into the heart. The ICD detects and corrects two types of dangerous irregular heart  rhythms (arrhythmias):  A rapid heart rhythm in the lower chambers of the heart (ventricles). This is called ventricular tachycardia.  The ventricles contracting in an uncoordinated way. This is called ventricular fibrillation. There are different types of ICDs, and the electrical signals from the ICD can be programmed differently based on the condition being treated. The electrical signals from the ICD can be low-energy pulses, high-energy shocks, or a combination of the two. The low-energy pulses are generally used to restore the heartbeat to normal when it is either too slow (bradycardia) or too fast. These pulses are painless. The high-energy shocks are used to treat abnormal rhythms such as ventricular tachycardia or ventricular fibrillation. This shock may feel like a strong jolt in the chest. Your health care provider may recommend an ICD if you have:  Had a ventricular arrhythmia in the past.  A damaged heart because of a disease or heart condition.  A weakened heart muscle from a heart attack or cardiac arrest.  A congenital heart defect.  Long QT syndrome, which is a disorder of the heart's electrical system.  Brugada syndrome, which is a condition that causes a disruption of the heart's normal rhythm. Tell a health care provider about:  Any allergies you have.  All medicines you are taking, including vitamins, herbs, eye drops, creams, and over-the-counter medicines.  Any problems you or family members have had with anesthetic medicines.  Any blood disorders you have.  Any surgeries you have had.  Any medical conditions you have.  Whether you are pregnant or may be pregnant. What are the risks? Generally, this is a safe procedure. However, problems may occur, including:  Infection.  Bleeding.  Allergic reactions to medicines used during the procedure.  Blood clots.  Swelling or bruising.  Damage to nearby structures or organs, such as nerves, lungs, blood  vessels, or the heart where the ICD leads or pulse generator is implanted. What happens before the procedure? Staying hydrated Follow instructions from your health care provider about hydration, which may include:  Up to 2 hours before the procedure - you may continue to drink clear liquids, such as water, clear fruit juice, black coffee, and plain tea.   Eating and drinking restrictions Follow instructions from your health care provider about eating and drinking, which may include:  8 hours before the procedure - stop eating heavy meals or foods, such as meat, fried foods, or fatty foods.  6 hours before the procedure - stop eating light meals or foods, such as toast or cereal.  6 hours before the procedure - stop drinking milk or drinks that contain milk.  2 hours before the procedure - stop drinking clear liquids. Medicines Ask your health care provider about:  Changing or stopping your regular medicines. This is especially important if you are taking diabetes medicines or blood thinners.  Taking medicines such as aspirin and ibuprofen. These medicines can thin your blood. Do not take these medicines unless your health care provider tells you to take them.  Taking over-the-counter medicines, vitamins, herbs, and supplements. Tests You may have an exam or testing. These may include:  Blood tests.  A test to check the electrical signals in your heart (electrocardiogram, ECG).  Imaging tests, such as a chest X-ray.  Echocardiogram. This is an ultrasound of your heart to evaluate your heart structures and function.  An event monitor or Holter monitor to wear at home. General instructions  Do not use any products that contain nicotine or tobacco for at least 4 weeks before the procedure. These products include cigarettes, chewing tobacco, and vaping devices, such as e-cigarettes. If you need help quitting, ask your health care provider.  Ask your health care provider: ? How  your procedure site will be marked. ? What steps will be taken to help prevent infection. These may include:  Removing hair at the surgery site.  Washing skin with a germ-killing soap.  Taking antibiotic medicine.  You may be asked to shower with a germ-killing soap.  Plan to have a responsible adult take you home from the hospital or clinic. What happens during the procedure?  Small monitors will be put on your body. They will be used to check your heart rate, blood pressure, and oxygen level.  A pair of sticky pads (defibrillator pads) may be placed on your back and chest. These pads are able to pace your heart as needed during the procedure.  An IV will be inserted into one of your veins.  You will be given one or more of the following: ? A medicine to help you relax (sedative). ? A medicine to numb the area (local anesthetic). ? A medicine to make you fall asleep(general anesthetic).  A small incision will be made to create a deep pocket under the skin of your chest or abdomen.  Leads will be guided through a blood vessel into your heart and attached to your heart muscles. Depending on the ICD, the leads may go into one ventricle, or they may go into both ventricles and into an upper chamber of the heart. An X-ray machine (fluoroscope) will be used to help guide the leads. The other end of the leads will be attached to the pulse generator.  The pulse generator will be placed into the pocket under the skin.  The ICD will be tested, and your health care provider will program the ICD for the condition being treated.  The incision will be closed with stitches (sutures), skin glue, adhesive strips, or staples.  A bandage (dressing) will be placed over the incision. The procedure may vary among health care providers and hospitals.   What happens after the procedure?  Your blood pressure, heart rate, breathing rate, and blood oxygen level will be monitored until you leave the  hospital or clinic. Your health care provider will also monitor your ICD to make sure it is working properly.  A chest X-ray will be taken to check that the ICD is in the right place.  Do not raise the arm on the side  of your procedure higher than your shoulder for as long as told by your health care provider. This is usually at least 6 weeks.  You may be given an identification card explaining that you have an ICD.  You will be given a remote home monitoring device to use with your ICD to allow your device to communicate with your clinic. Summary  An implantable cardioverter defibrillator (ICD) is a device that identifies and corrects abnormal heart rhythms. Cardioverter defibrillator implantation is a surgery to place an ICD under the skin in the chest or abdomen.  An ICD consists of a battery, a small computer (pulse generator), and wires (leads) that go into the heart.  During the procedure, the ICD will be tested, and your health care provider will program the ICD for the condition being treated.  After the procedure, a chest X-ray will be taken to check that the ICD is in the right place. This information is not intended to replace advice given to you by your health care provider. Make sure you discuss any questions you have with your health care provider. Document Revised: 10/01/2019 Document Reviewed: 10/01/2019 Elsevier Patient Education  [employer].

## 2020-08-31 NOTE — Telephone Encounter (Signed)
R/s appts per 5/16 sch msg. Called pt, no answer. Left msg with appts date and times.

## 2020-08-31 NOTE — Progress Notes (Signed)
Electrophysiology Office Note   Date:  08/31/2020   ID:  Erin Fields, DOB 07-20-1965, MRN [id number]  PCP:  Erin Mclean, MD  Cardiologist: Agustin Cree Primary Electrophysiologist:   Erin Leeds, MD    Chief Complaint: CHF   History of Present Illness: Erin Fields is a 55 y.o. female who is being seen today for the evaluation of CHF at the request of Erin Liter, MD. Presenting today for electrophysiology evaluation.  She has a history significant for right-sided breast cancer, type 2 diabetes, nonobstructive coronary artery disease, hypertension, hyperlipidemia.  She presented to cardiology clinic January 2022 after being found to have an abnormal ECG and prep for hip replacement surgery.  An echo showed an ejection fraction of 25%.  She was put on optimal medical therapy.  Left heart catheterization showed nonobstructive coronary artery disease.  She presented back to cardiology clinic complaining of exertional shortness of breath and hip pain.  Today, she denies symptoms of palpitations, chest pain, shortness of breath, orthopnea, PND, lower extremity edema, claudication, dizziness, presyncope, syncope, bleeding, or neurologic sequela. The patient is tolerating medications without difficulties.    Past Medical History:  Diagnosis Date  . Allergic rhinitis due to animal (cat) (dog) hair and dander 04/27/2020  . Allergic rhinitis due to pollen 04/27/2020  . Anxiety   . Anxiety and depression 01/20/2018  . Asthma   . Asthma, persistent controlled 10/05/2018  . B12 deficiency 11/27/2019  . Borderline diabetes 12/17/2015  . Cancer [facility]) 2019   right breast cancer  . CERVICAL CANCER, HX OF 12/06/2006   Qualifier: Diagnosis of  By: Erin Morale MD, Erin Fields   . Chronic sinusitis 01/26/2015  . Chronic venous insufficiency 09/02/2015  . Colon polyps   . Congestive heart failure ([place]) 04/29/2020  . Controlled type 2 diabetes mellitus without complication, without  long-term current use of insulin ([place]) 05/28/2017  . Coronary artery disease 25% of RCA, 20% intermediate branch, 40% diagonal branch based on cardiac cath from 2022 07/22/2020  . Crohn disease ([place]) 11/29/2016  . Depression   . Diabetes mellitus without complication ([place])    type 2  . Dilated cardiomyopathy ([place]) ejection fraction 25% by echocardiogram from January 2022 04/29/2020  . Dyspnea 09/15/2014  . Elevated triglycerides with high cholesterol 11/26/2013  . Environmental allergies   . Eustachian tube dysfunction 12/04/2014  . Ex-smoker 11/18/2015  . Family history of breast cancer   . Genetic testing 07/16/2017   Negative genetic testing on the multi-cancer panel.  The Multi-Gene Panel offered by [name] includes sequencing and/or deletion duplication testing of the following 83 genes: ALK, APC, ATM, AXIN2,BAP1,  BARD1, BLM, BMPR1A, BRCA1, BRCA2, BRIP1, CASR, CDC73, CDH1, CDK4, CDKN1B, CDKN1C, CDKN2A (p14ARF), CDKN2A (p16INK4a), CEBPA, CHEK2, CTNNA1, DICER1, DIS3L2, EGFR (c.2369C>T, p.Thr790Met variant onl  . GERD (gastroesophageal reflux disease)   . HEADACHE 12/06/2006   Qualifier: Diagnosis of  By: Erin Morale MD, Erin Fields   . History of adenomatous polyp of colon 11/19/2017  . Hyperlipidemia   . Hypertension   . Hypothyroidism   . IDA (iron deficiency anemia) 11/06/2019  . Impaired fasting glucose 12/12/2017  . Insomnia 08/20/2013  . Irritable bowel syndrome 11/29/2006   Qualifier: Diagnosis of  By: Erin Morale MD, Erin Fields   . Left sided abdominal pain 12/13/2017  . Liver hemangioma 08/24/2014  . Malignant neoplasm of upper-outer quadrant of right breast in female, estrogen receptor positive (Erin Fields) 03/20/2017  . Migraines   . Mild persistent asthma, uncomplicated 7/79/3903  .  Nasal polyp, unspecified 01/10/2018  . Oral herpes 04/20/2013  . Other allergic rhinitis 09/15/2014  . Personal history of radiation therapy 2019   34 radiation tx  . PONV (postoperative nausea and vomiting)    scopolamine patch helps   . Recurrent infections 10/05/2018  . Right upper lobe pulmonary nodule 01/10/2018  . Symptomatic varicose veins, right 09/02/2015  . Ulcerative colitis (Erin Fields)   . Varicose veins    Past Surgical History:  Procedure Laterality Date  . BREAST BIOPSY Right 02/2017  . BREAST BIOPSY Right 11/07/2019  . BREAST LUMPECTOMY Right 04/24/2017  . BREAST LUMPECTOMY WITH RADIOACTIVE SEED AND SENTINEL LYMPH NODE BIOPSY Right 04/24/2017   Procedure: RIGHT BREAST LUMPECTOMY WITH RADIOACTIVE SEED AND SENTINEL LYMPH NODE BIOPSY;  Surgeon: [name], MD;  Location: [place];  Service: General;  Laterality: Right;  . crapal tunnel relase Left 05/2018  . ENDOMETRIAL ABLATION  2010   Had abnormal uterine bleeding (heavy and frequent), performed in [place], menses stopped completely 2 years after  . EXPLORATORY LAPAROTOMY     Had exploratory surgery at the age of 22 for abdominal pain with cyst found on her left ovary, not treated surgically  . FOOT SURGERY Right 2012  . HAND SURGERY Right   . RE-EXCISION OF BREAST LUMPECTOMY Right 05/15/2017   Procedure: RE-EXCISION OF BREAST LUMPECTOMY;  Surgeon: [name], MD;  Location: [place];  Service: General;  Laterality: Right;  . RIGHT/LEFT HEART CATH AND CORONARY ANGIOGRAPHY N/A 06/21/2020   Procedure: RIGHT/LEFT HEART CATH AND CORONARY ANGIOGRAPHY;  Surgeon: Nelva Bush, MD;  Location: [facility];  Service: Cardiovascular;  Laterality: N/A;  . ROBOTIC ASSISTED SALPINGO OOPHERECTOMY Bilateral 08/29/2019   Procedure: XI ROBOTIC ASSISTED SALPINGO OOPHORECTOMY;  Surgeon: [name], MD;  Location[facility];  Service: Gynecology;  Laterality: Bilateral;  . Septoplasty with turbinate reduction    . TONSILLECTOMY  1980  . VEIN SURGERY     Removal Right Leg  . WISDOM TOOTH EXTRACTION       Current Outpatient Medications  Medication Sig Dispense Refill  . acyclovir (ZOVIRAX) 400 MG tablet  TAKE 1 TABLET (400 MG TOTAL) BY MOUTH 3 (THREE) TIMES DAILY AS NEEDED. (Patient taking differently: Take 400 mg by mouth 3 (three) times daily as needed (cold sores).) 270 tablet 1  . Adalimumab 80 MG/0.8ML PNKT Inject 80 mg into the skin every 14 (fourteen) days.    Marland Kitchen ascorbic acid (VITAMIN C) 500 MG tablet Take 500 mg by mouth daily.    . Azelastine HCl 0.15 % SOLN Place 2 sprays into both nostrils daily. For 30 days    . budesonide-formoterol (SYMBICORT) 160-4.5 MCG/ACT inhaler Inhale 2 puffs into the lungs 2 (two) times daily. 1 each 3  . calcium carbonate (OS-CAL) 600 MG TABS tablet Take 600 mg by mouth daily with breakfast.    . carvedilol (COREG) 12.5 MG tablet Take 1 tablet (12.5 mg total) by mouth 2 (two) times daily. 60 tablet 2  . cetirizine (ZYRTEC) 10 MG tablet Take 10 mg by mouth daily.    . Cholecalciferol (VITAMIN D) 125 MCG (5000 UT) CAPS Take 5,000 Units by mouth daily.    . diphenoxylate-atropine (LOMOTIL) 2.5-0.025 MG tablet Take 1 tablet by mouth 4 (four) times daily as needed for diarrhea or loose stools.    Marland Kitchen EPINEPHrine 0.3 mg/0.3 mL IJ SOAJ injection Inject 0.3 mg into the muscle as needed for anaphylaxis.    Marland Kitchen HYDROcodone-acetaminophen (  NORCO/VICODIN) 5-325 MG tablet Take 1 tablet by mouth every 6 (six) hours as needed for up to 20 doses for severe pain or moderate pain. 20 tablet 0  . Hyoscyamine Sulfate 0.375 MG TBCR Take 0.375 mg by mouth daily.    . lansoprazole (PREVACID SOLUTAB) 15 MG disintegrating tablet Take 15 mg by mouth daily.    Marland Kitchen levalbuterol (XOPENEX HFA) 45 MCG/ACT inhaler Inhale 2 puffs into the lungs every 4 (four) hours as needed for wheezing (Q4-6 Hours PRN).    Marland Kitchen levothyroxine (SYNTHROID) 88 MCG tablet Take 1 tablet (88 mcg total) by mouth daily before breakfast. 90 tablet 1  . MAGNESIUM PO Take 250 mg by mouth daily.    . meloxicam (MOBIC) 15 MG tablet Take 15 mg by mouth daily as needed for pain.    . Menthol, Topical Analgesic, (BIOFREEZE COLORLESS)  4 % GEL Apply 1 application topically daily as needed (Hip pain).    . mesalamine (LIALDA) 1.2 g EC tablet Take 2.4 g by mouth in the morning and at bedtime.     . metFORMIN (GLUCOPHAGE) 500 MG tablet Take 1 tablet (500 mg total) by mouth 2 (two) times daily with a meal. 180 tablet 3  . montelukast (SINGULAIR) 10 MG tablet Take 10 mg by mouth at bedtime.     . promethazine (PHENERGAN) 25 MG tablet Take 0.5-1 tablets (12.5-25 mg total) by mouth daily as needed for nausea or vomiting. 30 tablet 5  . rosuvastatin (CRESTOR) 20 MG tablet Take 1 tablet (20 mg total) by mouth daily. (Patient taking differently: Take 20 mg by mouth at bedtime.) 90 tablet 3  . sacubitril-valsartan (ENTRESTO) 49-51 MG Take 1 tablet by mouth 2 (two) times daily. 60 tablet 3  . spironolactone (ALDACTONE) 25 MG tablet Take 0.5 tablets (12.5 mg total) by mouth daily. 30 tablet 1  . triamcinolone (NASACORT) 55 MCG/ACT AERO nasal inhaler Place 2 sprays into the nose daily.    Marland Kitchen venlafaxine XR (EFFEXOR-XR) 150 MG 24 hr capsule Take 1 capsule (150 mg total) by mouth daily with breakfast. 90 capsule 1  . vitamin B-12 (CYANOCOBALAMIN) 1000 MCG tablet Take 1,000 mcg by mouth daily.    Marland Kitchen zinc gluconate 50 MG tablet Take 50 mg by mouth daily.    Marland Kitchen zolpidem (AMBIEN) 5 MG tablet TAKE 1 TABLET BY MOUTH AT BEDTIME AS NEEDED FOR SLEEP (Patient taking differently: Take 5 mg by mouth at bedtime.) 30 tablet 2   No current facility-administered medications for this visit.    Allergies:   Losartan potassium, Ace inhibitors, Aspirin, Cefaclor, Oxycodone, Sulfa antibiotics, Lisinopril, and Other   Social History:  The patient  reports that she quit smoking about 26 years ago. Her smoking use included cigarettes. She has a 45.00 pack-year smoking history. She has never used smokeless tobacco. She reports current alcohol use. She reports that she does not use drugs.   Family History:  The patient's family history includes Arthritis (age of onset:  72) in her mother; Brain cancer in her father; Breast cancer in her mother; Colitis in her sister; Crohn's disease in her mother; Diabetes in her maternal grandfather and mother; Heart attack in her maternal grandfather; Heart disease in her maternal grandfather and mother; Hyperlipidemia in her mother; Hypertension in her mother; Leukemia in her maternal grandfather; Leukemia (age of onset: 11) in her cousin; Lung cancer in her father and mother.    ROS:  Please see the history of present illness.   Otherwise, review of  systems is positive for none.   All other systems are reviewed and negative.    PHYSICAL EXAM: VS:  BP (!) 142/76   Pulse 80   Ht 5' 3.5" (1.613 m)   Wt 157 lb (71.2 kg)   SpO2 98%   BMI 27.38 kg/m  , BMI Body mass index is 27.38 kg/m. GEN: Well nourished, well developed, in no acute distress  HEENT: normal  Neck: no JVD, carotid bruits, or masses Cardiac: RRR; no murmurs, rubs, or gallops,no edema  Respiratory:  clear to auscultation bilaterally, normal work of breathing GI: soft, nontender, nondistended, + BS MS: no deformity or atrophy  Skin: warm and dry Neuro:  Strength and sensation are intact Psych: euthymic mood, full affect  EKG:  EKG is not ordered today. Personal review of the ekg ordered 08/24/20 shows sinus rhythm, rate  Recent Labs: 08/13/2020: ALT 13; BUN 6; Creatinine 0.69; Hemoglobin 12.9; Platelet Count 394; Potassium 4.4; Sodium 139    Lipid Panel     Component Value Date/Time   CHOL 128 08/10/2020 0936   TRIG 153 (H) 08/10/2020 0936   HDL 37 (L) 08/10/2020 0936   CHOLHDL 3.5 08/10/2020 0936   CHOLHDL 4 06/18/2019 0952   VLDL 31.8 06/18/2019 0952   LDLCALC 65 08/10/2020 0936   LDLDIRECT 101.0 12/02/2018 0955     Wt Readings from Last 3 Encounters:  08/31/20 157 lb (71.2 kg)  08/24/20 157 lb (71.2 kg)  08/13/20 156 lb 6.4 oz (70.9 kg)      Other studies Reviewed: Additional studies/ records that were reviewed today include: TTE  07/23/20  Review of the above records today demonstrates:  1. Left ventricular ejection fraction, by estimation, is 20 to 25%. The  left ventricle has severely decreased function. The left ventricle  demonstrates global hypokinesis. The left ventricular internal cavity size  was mildly dilated. Left ventricular  diastolic parameters are indeterminate.  2. Right ventricular systolic function is normal. The right ventricular  size is normal.  3. The mitral valve is normal in structure. Mild mitral valve  regurgitation. No evidence of mitral stenosis.  4. The aortic valve is tricuspid. Aortic valve regurgitation is not  visualized. No aortic stenosis is present.  5. The inferior vena cava is normal in size with greater than 50%  respiratory variability, suggesting right atrial pressure of 3 mmHg.   RHC/LHC 06/21/20 1. Mild to moderate, non-obstructive coronary artery disease. 2. Normal left and right heart filling pressures. 3. Mildly reduced Fick cardiac output/index.   ASSESSMENT AND PLAN:  1.  Chronic systolic heart failure due to nonischemic cardiomyopathy: Currently on carvedilol, Entresto, Aldactone.  Ejection fraction is unfortunately continued to be reduced.  At this point, she would benefit from ICD implant.  Risks and benefits were discussed.  Risk include bleeding, tamponade, infection, pneumothorax, among others.  She understands these risks and has agreed to the procedure.  2.  Coronary artery disease: Nonobstructive on most recent catheterization.  Case discussed with primary cardiology  Current medicines are reviewed at length with the patient today.   The patient does not have concerns regarding her medicines.  The following changes were made today:  none  Labs/ tests ordered today include:  Orders Placed This Encounter  Procedures  . Basic metabolic panel  . CBC     Disposition:   FU with   3 months  Signed,  Erin Leeds, MD  08/31/2020  3:35 PM     [address]  [address] [place] [address] [phone] (office) [phone] (fax)

## 2020-09-01 ENCOUNTER — Telehealth: Payer: Self-pay | Admitting: *Deleted

## 2020-09-01 NOTE — Telephone Encounter (Signed)
   [facility] Pre-operative Risk Assessment    Patient Name: Astryd Pearcy  DOB: [birth year]  MRN: [id number]   HEARTCARE STAFF: - Please ensure there is not already an duplicate clearance open for this procedure. - Under Visit Info/Reason for Call, type in Other and utilize the format Clearance MM/DD/YY or Clearance TBD. Do not use dashes or single digits. - If request is for dental extraction, please clarify the # of teeth to be extracted.  Request for surgical clearance:  1. What type of surgery is being performed? RIGHT TOTAL HIP REPLACEMENT   2. When is this surgery scheduled? TBD   3. What type of clearance is required (medical clearance vs. Pharmacy clearance to hold med vs. Both)? MEDICAL  4. Are there any medications that need to be held prior to surgery and how long? NONE LISTED   5. Practice name and name of physician performing surgery? [facility]; DR. Christia Reading [facility]   6. What is the office phone number? [phone]   7.   What is the office fax number? [place].   Anesthesia type (None, local, MAC, general) ? CHOICE   Julaine Hua 09/01/2020, 5:30 PM  _________________________________________________________________   (provider comments below)

## 2020-09-02 NOTE — Telephone Encounter (Signed)
Appt notes have been update to include pre-op clearance.

## 2020-09-02 NOTE — Telephone Encounter (Signed)
  Primary Cardiologist:Robert Agustin Cree, MD  Chart reviewed as part of pre-operative protocol coverage. Because of Erin Fields's past medical history and time since last visit, he/she will require a follow-up visit in order to better assess preoperative cardiovascular risk.  Pre-op covering staff: - Please schedule appointment and call patient to inform them. - Please contact requesting surgeon's office via preferred method (i.e, phone, fax) to inform them of need for appointment prior to surgery.  If applicable, this message will also be routed to pharmacy pool and/or primary cardiologist for input on holding anticoagulant/antiplatelet agent as requested below so that this information is available at time of patient's appointment.   Patient currently with reduced ejection fraction and plan for ICD insertion.  Has had evaluation with electrophysiology.  Follow-up visit with cardiology scheduled for 11/04/2020.  I will defer cardiac evaluation for hip surgery to that visit.  Preop call back team please add preoperative cardiac evaluation to schedule list.  Thank you.   Deberah Pelton, NP  09/02/2020, 12:03 PM

## 2020-09-04 ENCOUNTER — Other Ambulatory Visit: Payer: Self-pay | Admitting: Cardiology

## 2020-09-06 ENCOUNTER — Other Ambulatory Visit: Payer: Self-pay | Admitting: Medical

## 2020-09-08 ENCOUNTER — Other Ambulatory Visit: Payer: Self-pay | Admitting: Family Medicine

## 2020-09-08 DIAGNOSIS — F5104 Psychophysiologic insomnia: Secondary | ICD-10-CM

## 2020-09-16 ENCOUNTER — Encounter: Payer: Self-pay | Admitting: Family Medicine

## 2020-09-16 DIAGNOSIS — M1610 Unilateral primary osteoarthritis, unspecified hip: Secondary | ICD-10-CM

## 2020-09-16 MED ORDER — HYDROCODONE-ACETAMINOPHEN 5-325 MG PO TABS: 1.0000 | ORAL_TABLET | Freq: Four times a day (QID) | ORAL | 0 refills | Status: DC | PRN

## 2020-09-17 NOTE — Telephone Encounter (Signed)
Our office received a duplicate clearance request. Please see notes in-regards to pt has been scheduled for ICD Implant 10/01/20. Pt has an appt with primary cardiologist Dr. Agustin Cree 11/04/20. Pre op assessment at the time of her appt with Dr. Agustin Cree. I will send note to surgeon's office as FYI.

## 2020-09-21 NOTE — Telephone Encounter (Signed)
Informed pt that we have not received any FMLA paperwork from her employer (she states they sent it last month the day after Dr. Curt Bears visit). Informed/advised that she will need to bring paperwork to the office, sign a release form and pay a small fee for paperwork completion by office staff. Pt understands and will try and stop by today/tomorrow to get this started.

## 2020-09-23 ENCOUNTER — Other Ambulatory Visit: Payer: Self-pay | Admitting: General Surgery

## 2020-09-23 DIAGNOSIS — Z1231 Encounter for screening mammogram for malignant neoplasm of breast: Secondary | ICD-10-CM

## 2020-09-23 DIAGNOSIS — Z0279 Encounter for issue of other medical certificate: Secondary | ICD-10-CM

## 2020-09-24 LAB — CBC
Hematocrit: 38.3 % (ref 34.0–46.6)
Hemoglobin: 12.8 g/dL (ref 11.1–15.9)
MCH: 29.6 pg (ref 26.6–33.0)
MCHC: 33.4 g/dL (ref 31.5–35.7)
MCV: 89 fL (ref 79–97)
Platelets: 452 10*3/uL — ABNORMAL HIGH (ref 150–450)
RBC: 4.33 x10E6/uL (ref 3.77–5.28)
RDW: 12.1 % (ref 11.7–15.4)
WBC: 12.3 10*3/uL — ABNORMAL HIGH (ref 3.4–10.8)

## 2020-09-24 LAB — BASIC METABOLIC PANEL
BUN/Creatinine Ratio: 11 (ref 9–23)
BUN: 6 mg/dL (ref 6–24)
CO2: 24 mmol/L (ref 20–29)
Calcium: 9.5 mg/dL (ref 8.7–10.2)
Chloride: 98 mmol/L (ref 96–106)
Creatinine, Ser: 0.55 mg/dL — ABNORMAL LOW (ref 0.57–1.00)
Glucose: 112 mg/dL — ABNORMAL HIGH (ref 65–99)
Potassium: 5.1 mmol/L (ref 3.5–5.2)
Sodium: 136 mmol/L (ref 134–144)
eGFR: 107 mL/min/{1.73_m2} (ref >59)

## 2020-09-24 NOTE — Telephone Encounter (Signed)
Pt aware I would look into this further and follow up next week. She is very appreciative of this.

## 2020-09-24 NOTE — Progress Notes (Signed)
[place] at [facility] [address], [place], [place] [address] [phone]  Date:  09/27/2020   Name:  Erin Fields   DOB:  1965/09/07   MRN:  [id number]  PCP:  Darreld Mclean, MD    Chief Complaint: Cough and lab results review  (Make sure you dont have an infection )   History of Present Illness:  Erin Fields is a 55 y.o. very pleasant female patient who presents with the following:  Pt seen today to go over some labs - history significant for right-sided breast cancer, type 2 diabetes, nonobstructive coronary artery disease, hypertension, hyperlipidemia Last visit with myself was in December.  At that time we were doing a preoperative clearance for hip replacement, she was noted to have EKG abnormality which led to diagnosis of dilated cardiomyopathy with a EF of 25% She is being treated with Entresto, carvedilol, spironolactone  She has consulted with Dr. Curt Bears with electrophysiology-plan is for ICD implantation later this week This cardiac problem has delayed her hip surgery, she is using hydrocodone for end-stage arthritis pain.  She does not have a surgery date scheduled yet  She is also seen by allergy and immunology clinic per [facility] Her gastroenterologist is Dr. Earlean Shawl, [facility] of ulcerative pancolitis  She was a bit concerned about recent elevation of her wbc count- she wondered if she might have a sinus infection which could complicate her upcoming cardiac surgery.  She does tend to get a sinus infection this time of year.  She does get allergy shots She notes sinus pressure, drainage down into her throat and some cough- worse in the am No fever   Pap smear-would like to update today Eye exam- done January of 2022 Foot exam-we will update for today COVID-19 fourth dose suggested   Patient Active Problem List   Diagnosis Date Noted   Anxiety    Coronary artery disease 25% of RCA, 20%  intermediate branch, 40% diagonal branch based on cardiac cath from 2022 07/22/2020   Dilated cardiomyopathy ([place]) ejection fraction 25% by echocardiogram from January 2022 04/29/2020   Congestive heart failure ([place]) 04/29/2020   Allergic rhinitis due to animal (cat) (dog) hair and dander 04/27/2020   Allergic rhinitis due to pollen 04/27/2020   Mild persistent asthma, uncomplicated 82/50/0370   PONV (postoperative nausea and vomiting)    Migraines    GERD (gastroesophageal reflux disease)    Environmental allergies    Diabetes mellitus without complication ([place])    Depression    Colon polyps    B12 deficiency 11/27/2019   IDA (iron deficiency anemia) 11/06/2019   Asthma, persistent controlled 10/05/2018   Recurrent infections 10/05/2018   Anxiety and depression 01/20/2018   Right upper lobe pulmonary nodule 01/10/2018   Nasal polyp, unspecified 01/10/2018   Left sided abdominal pain 12/13/2017   Impaired fasting glucose 12/12/2017   History of adenomatous polyp of colon 11/19/2017   Genetic testing 07/16/2017   Family history of breast cancer    Controlled type 2 diabetes mellitus without complication, without long-term current use of insulin ([place]) 05/28/2017   Personal history of radiation therapy 2019   Cancer ([place]) 2019   Malignant neoplasm of upper-outer quadrant of right breast in female, estrogen receptor positive ([place]) 03/20/2017   Crohn disease ([place]) 11/29/2016   Borderline diabetes 12/17/2015   Ex-smoker 11/18/2015   Chronic venous insufficiency 09/02/2015   Symptomatic varicose veins, right 09/02/2015  Chronic sinusitis 01/26/2015   Eustachian tube dysfunction 12/04/2014   Other allergic rhinitis 09/15/2014   Dyspnea 09/15/2014   Liver hemangioma 08/24/2014   Elevated triglycerides with high cholesterol 11/26/2013   Insomnia 08/20/2013   Hypertension 08/20/2013   Ulcerative colitis ([place]) 04/20/2013   Oral herpes 04/20/2013   Hyperlipidemia 12/06/2006    HEADACHE 12/06/2006   CERVICAL CANCER, HX OF 12/06/2006   Hypothyroidism 11/29/2006   Asthma 11/29/2006   IRRITABLE BOWEL SYNDROME 11/29/2006    Past Medical History:  Diagnosis Date   Allergic rhinitis due to animal (cat) (dog) hair and dander 04/27/2020   Allergic rhinitis due to pollen 04/27/2020   Anxiety    Anxiety and depression 01/20/2018   Asthma    Asthma, persistent controlled 10/05/2018   B12 deficiency 11/27/2019   Borderline diabetes 12/17/2015   Cancer ([place]) 2019   right breast cancer   CERVICAL CANCER, HX OF 12/06/2006   Qualifier: Diagnosis of  By: Arnoldo Morale MD, John E    Chronic sinusitis 01/26/2015   Chronic venous insufficiency 09/02/2015   Colon polyps    Congestive heart failure ([place]) 04/29/2020   Controlled type 2 diabetes mellitus without complication, without long-term current use of insulin ([place]) 05/28/2017   Coronary artery disease 25% of RCA, 20% intermediate branch, 40% diagonal branch based on cardiac cath from 2022 07/22/2020   Crohn disease ([place]) 11/29/2016   Depression    Diabetes mellitus without complication ([place])    type 2   Dilated cardiomyopathy ([place]) ejection fraction 25% by echocardiogram from January 2022 04/29/2020   Dyspnea 09/15/2014   Elevated triglycerides with high cholesterol 11/26/2013   Environmental allergies    Eustachian tube dysfunction 12/04/2014   Ex-smoker 11/18/2015   Family history of breast cancer    Genetic testing 07/16/2017   Negative genetic testing on the multi-cancer panel.  The Multi-Gene Panel offered by [name] includes sequencing and/or deletion duplication testing of the following 83 genes: ALK, APC, ATM, AXIN2,BAP1,  BARD1, BLM, BMPR1A, BRCA1, BRCA2, BRIP1, CASR, CDC73, CDH1, CDK4, CDKN1B, CDKN1C, CDKN2A (p14ARF), CDKN2A (p16INK4a), CEBPA, CHEK2, CTNNA1, DICER1, DIS3L2, EGFR (c.2369C>T, p.Thr790Met variant onl   GERD (gastroesophageal reflux disease)    HEADACHE 12/06/2006   Qualifier: Diagnosis of  By: Arnoldo Morale MD, John E     History of adenomatous polyp of colon 11/19/2017   Hyperlipidemia    Hypertension    Hypothyroidism    IDA (iron deficiency anemia) 11/06/2019   Impaired fasting glucose 12/12/2017   Insomnia 08/20/2013   Irritable bowel syndrome 11/29/2006   Qualifier: Diagnosis of  By: Arnoldo Morale MD, John E    Left sided abdominal pain 12/13/2017   Liver hemangioma 08/24/2014   Malignant neoplasm of upper-outer quadrant of right breast in female, estrogen receptor positive ([place]) 03/20/2017   Migraines    Mild persistent asthma, uncomplicated 6/71/2458   Nasal polyp, unspecified 01/10/2018   Oral herpes 04/20/2013   Other allergic rhinitis 09/15/2014   Personal history of radiation therapy 2019   36 radiation tx   PONV (postoperative nausea and vomiting)    scopolamine patch helps   Recurrent infections 10/05/2018   Right upper lobe pulmonary nodule 01/10/2018   Symptomatic varicose veins, right 09/02/2015   Ulcerative colitis ([place])    Varicose veins     Past Surgical History:  Procedure Laterality Date   BREAST BIOPSY Right 02/2017   BREAST BIOPSY Right 11/07/2019   BREAST LUMPECTOMY Right 04/24/2017   BREAST LUMPECTOMY WITH RADIOACTIVE SEED AND SENTINEL LYMPH NODE BIOPSY Right  04/24/2017   Procedure: RIGHT BREAST LUMPECTOMY WITH RADIOACTIVE SEED AND SENTINEL LYMPH NODE BIOPSY;  Surgeon: [name], MD;  Location: [facility];  Service: General;  Laterality: Right;   crapal tunnel relase Left 05/2018   ENDOMETRIAL ABLATION  2010   Had abnormal uterine bleeding (heavy and frequent), performed in [place], menses stopped completely 2 years after   EXPLORATORY LAPAROTOMY     Had exploratory surgery at the age of 72 for abdominal pain with cyst found on her left ovary, not treated surgically   FOOT SURGERY Right 2012   HAND SURGERY Right    RE-EXCISION OF BREAST LUMPECTOMY Right 05/15/2017   Procedure: RE-EXCISION OF BREAST LUMPECTOMY;  Surgeon: [name], MD;  Location: [facility];  Service: General;  Laterality: Right;   RIGHT/LEFT HEART CATH AND CORONARY ANGIOGRAPHY N/A 06/21/2020   Procedure: RIGHT/LEFT HEART CATH AND CORONARY ANGIOGRAPHY;  Surgeon: Nelva Bush, MD;  Location: [facility];  Service: Cardiovascular;  Laterality: N/A;   ROBOTIC ASSISTED SALPINGO OOPHERECTOMY Bilateral 08/29/2019   Procedure: XI ROBOTIC ASSISTED SALPINGO OOPHORECTOMY;  Surgeon: [name], MD;  Location: [facility];  Service: Gynecology;  Laterality: Bilateral;   Septoplasty with turbinate reduction     TONSILLECTOMY  1980   VEIN SURGERY     Removal Right Leg   WISDOM TOOTH EXTRACTION      Social History   Tobacco Use   Smoking status: Former    Packs/day: 3.00    Years: 15.00    Pack years: 45.00    Types: Cigarettes    Quit date: 09/14/1993    Years since quitting: 27.0   Smokeless tobacco: Never   Tobacco comments:    Quit 26 yrs ago  Vaping Use   Vaping Use: Never used  Substance Use Topics   Alcohol use: Yes    Comment: Drinks a couple every 2-3 months   Drug use: No    Family History  Problem Relation Age of Onset   Arthritis Mother 81       Deceased   Breast cancer Mother    Lung cancer Mother    Hyperlipidemia Mother    Heart disease Mother    Hypertension Mother    Diabetes Mother    Crohn's disease Mother    Diabetes Maternal Grandfather    Heart disease Maternal Grandfather    Heart attack Maternal Grandfather    Leukemia Maternal Grandfather    Colitis Sister    Leukemia Cousin 3       mat cousin   Lung cancer Father    Brain cancer Father     Allergies  Allergen Reactions   Losartan Potassium Shortness Of Breath and Other (See Comments)   Ace Inhibitors Cough   Aspirin Other (See Comments)    Upset stomach   Cefaclor Hives and Other (See Comments)   Oxycodone Hives   Sulfa Antibiotics Hives   Lisinopril Cough and Other (See Comments)   Other Other (See Comments)    Medication list has been  reviewed and updated.  Current Outpatient Medications on File Prior to Visit  Medication Sig Dispense Refill   acyclovir (ZOVIRAX) 400 MG tablet TAKE 1 TABLET (400 MG TOTAL) BY MOUTH 3 (THREE) TIMES DAILY AS NEEDED. (Patient taking differently: Take 400 mg by mouth 3 (three) times daily as needed (cold sores).) 270 tablet 1   Adalimumab 80 MG/0.8ML PNKT Inject 80 mg into the skin every 14 (fourteen) days.  ascorbic acid (VITAMIN C) 500 MG tablet Take 500 mg by mouth daily.     Azelastine HCl 0.15 % SOLN Place 2 sprays into both nostrils daily. For 30 days     calcium carbonate (OS-CAL) 600 MG TABS tablet Take 600 mg by mouth daily with breakfast.     carvedilol (COREG) 12.5 MG tablet Take 1 tablet (12.5 mg total) by mouth 2 (two) times daily. 60 tablet 2   cetirizine (ZYRTEC) 10 MG tablet Take 10 mg by mouth daily.     Cholecalciferol (VITAMIN D) 125 MCG (5000 UT) CAPS Take 5,000 Units by mouth daily.     diphenoxylate-atropine (LOMOTIL) 2.5-0.025 MG tablet Take 1 tablet by mouth 4 (four) times daily as needed for diarrhea or loose stools.     ENTRESTO 49-51 MG TAKE 1 TABLET BY MOUTH TWICE A DAY 180 tablet 3   EPINEPHrine 0.3 mg/0.3 mL IJ SOAJ injection Inject 0.3 mg into the muscle as needed for anaphylaxis.     HYDROcodone-acetaminophen (NORCO/VICODIN) 5-325 MG tablet Take 1 tablet by mouth every 6 (six) hours as needed for up to 20 doses for severe pain or moderate pain. 20 tablet 0   Hyoscyamine Sulfate 0.375 MG TBCR Take 0.375 mg by mouth daily.     lansoprazole (PREVACID SOLUTAB) 15 MG disintegrating tablet Take 15 mg by mouth daily.     levalbuterol (XOPENEX HFA) 45 MCG/ACT inhaler Inhale 2 puffs into the lungs every 4 (four) hours as needed for wheezing (Q4-6 Hours PRN).     levothyroxine (SYNTHROID) 88 MCG tablet Take 1 tablet (88 mcg total) by mouth daily before breakfast. 90 tablet 1   MAGNESIUM PO Take 250 mg by mouth daily.     meloxicam (MOBIC) 15 MG tablet Take 15 mg by mouth  daily as needed for pain.     Menthol, Topical Analgesic, (BIOFREEZE COLORLESS) 4 % GEL Apply 1 application topically daily as needed (Hip pain).     mesalamine (LIALDA) 1.2 g EC tablet Take 2.4 g by mouth in the morning and at bedtime.      metFORMIN (GLUCOPHAGE) 500 MG tablet Take 1 tablet (500 mg total) by mouth 2 (two) times daily with a meal. 180 tablet 3   montelukast (SINGULAIR) 10 MG tablet Take 10 mg by mouth at bedtime.      promethazine (PHENERGAN) 25 MG tablet Take 0.5-1 tablets (12.5-25 mg total) by mouth daily as needed for nausea or vomiting. 30 tablet 5   rosuvastatin (CRESTOR) 20 MG tablet Take 1 tablet (20 mg total) by mouth daily. (Patient taking differently: Take 20 mg by mouth at bedtime.) 90 tablet 3   spironolactone (ALDACTONE) 25 MG tablet Take 0.5 tablets (12.5 mg total) by mouth daily. 30 tablet 1   SYMBICORT 160-4.5 MCG/ACT inhaler INHALE 2 PUFFS INTO THE LUNGS TWICE A DAY 30.6 each 1   triamcinolone (NASACORT) 55 MCG/ACT AERO nasal inhaler Place 2 sprays into the nose daily.     venlafaxine XR (EFFEXOR-XR) 150 MG 24 hr capsule Take 1 capsule (150 mg total) by mouth daily with breakfast. 90 capsule 1   vitamin B-12 (CYANOCOBALAMIN) 1000 MCG tablet Take 1,000 mcg by mouth daily.     zinc gluconate 50 MG tablet Take 50 mg by mouth daily.     zolpidem (AMBIEN) 5 MG tablet TAKE 1 TABLET BY MOUTH EVERY DAY AT BEDTIME AS NEEDED FOR SLEEP 30 tablet 2   No current facility-administered medications on file prior to visit.  Review of Systems:  As per HPI- otherwise negative.   Physical Examination: Vitals:   09/27/20 1321  BP: 120/60  Pulse: 98  Temp: 98.4 F (36.9 C)  SpO2: 99%   Vitals:   09/27/20 1321  Weight: 156 lb (70.8 kg)  Height: 5' 3"  (1.6 m)   Body mass index is 27.63 kg/m. Ideal Body Weight: Weight in (lb) to have BMI = 25: 140.8  GEN: no acute distress.  Overweight, otherwise looks well HEENT: Atraumatic, Normocephalic.   Ears and Nose: No  external deformity. Bilateral TM wnl, oropharynx normal.  PEERL,EOMI. nasal cavity inflammation is present CV: RRR, No M/G/R. No JVD. No thrill. No extra heart sounds. PULM: CTA B, no wheezes, crackles, rhonchi. No retractions. No resp. distress. No accessory muscle use. ABD: S, NT, ND, +BS. No rebound. No HSM. EXTR: No c/c/e PSYCH: Normally interactive. Conversant.  Collected pap today -normal pelvic exam.  Patient had some difficulty with positioning due to hip pain Foot exam completed today Assessment and Plan: Screening for cervical cancer - Plan: Cytology - PAP  Bronchitis - Plan: doxycycline (VIBRAMYCIN) 100 MG capsule  Postoperative hypothyroidism - Plan: TSH  Diabetes mellitus without complication ([place]) - Plan: Hemoglobin A1c  Patient here today for a couple of concerns.  Pap smear collected We will treat with doxycycline for possible bronchitis/sinusitis.  I do not think this should affect her plans for ICD implantation, but did ask her to contact her surgical team with this update  Follow-up on A1c and thyroid today Will plan further follow- up pending labs.  This visit occurred during the SARS-CoV-2 public health emergency.  Safety protocols were in place, including screening questions prior to the visit, additional usage of staff PPE, and extensive cleaning of exam room while observing appropriate contact time as indicated for disinfecting solutions.   Signed Lamar Blinks, MD [facility] Readings from Last 3 Encounters:  09/27/20 156 lb (70.8 kg)  08/31/20 157 lb (71.2 kg)  08/24/20 157 lb (71.2 kg)   Weight 6 months ago was 10 pounds heavier  Received her labs as below, message to patient-we will make adjustment to her levothyroxine.  Decrease dose from 88 to 75 mcg Results for orders placed or performed in visit on 09/27/20  Hemoglobin A1c  Result Value Ref Range   Hgb A1c MFr Bld 6.3 4.6 - 6.5 %  TSH  Result Value Ref Range   TSH 0.31 (L) 0.35 - 4.50 uIU/mL

## 2020-09-27 ENCOUNTER — Ambulatory Visit (INDEPENDENT_AMBULATORY_CARE_PROVIDER_SITE_OTHER): Payer: No Typology Code available for payment source | Admitting: Family Medicine

## 2020-09-27 ENCOUNTER — Other Ambulatory Visit (HOSPITAL_COMMUNITY)
Admission: RE | Admit: 2020-09-27 | Discharge: 2020-09-27 | Disposition: A | Discharge status: Home / Self Care | Payer: No Typology Code available for payment source | Source: Ambulatory Visit | Attending: Family Medicine | Admitting: Family Medicine

## 2020-09-27 ENCOUNTER — Other Ambulatory Visit: Payer: Self-pay

## 2020-09-27 ENCOUNTER — Encounter: Payer: Self-pay | Admitting: Family Medicine

## 2020-09-27 VITALS — BP 120/60 | HR 98 | Temp 98.4°F | Ht 63.0 in | Wt 156.0 lb

## 2020-09-27 DIAGNOSIS — E890 Postprocedural hypothyroidism: Secondary | ICD-10-CM | POA: Diagnosis not present

## 2020-09-27 DIAGNOSIS — J40 Bronchitis, not specified as acute or chronic: Secondary | ICD-10-CM

## 2020-09-27 DIAGNOSIS — Z124 Encounter for screening for malignant neoplasm of cervix: Secondary | ICD-10-CM | POA: Diagnosis present

## 2020-09-27 DIAGNOSIS — E034 Atrophy of thyroid (acquired): Secondary | ICD-10-CM

## 2020-09-27 DIAGNOSIS — E119 Type 2 diabetes mellitus without complications: Secondary | ICD-10-CM

## 2020-09-27 LAB — TSH: TSH: 0.31 u[IU]/mL — ABNORMAL LOW (ref 0.35–4.50)

## 2020-09-27 LAB — HEMOGLOBIN A1C: Hgb A1c MFr Bld: 6.3 % (ref 4.6–6.5)

## 2020-09-27 MED ORDER — LEVOTHYROXINE SODIUM 75 MCG PO TABS: 75.0000 ug | ORAL_TABLET | Freq: Every day | ORAL | 6 refills | Status: DC

## 2020-09-27 MED ORDER — DOXYCYCLINE HYCLATE 100 MG PO CAPS: 100.0000 mg | ORAL_CAPSULE | Freq: Two times a day (BID) | ORAL | 0 refills | Status: DC

## 2020-09-27 NOTE — Patient Instructions (Signed)
Good to see you again today- I will be in touch with your labs and pap asap Doxycycline for sinus infection- let me know if symptoms are not improved soon!   Good luck- I hope that you are able to get your hip done soon

## 2020-09-28 ENCOUNTER — Encounter: Payer: Self-pay | Admitting: Family Medicine

## 2020-09-28 LAB — CYTOLOGY - PAP
Comment: NEGATIVE
Diagnosis: NEGATIVE
High risk HPV: NEGATIVE

## 2020-09-28 NOTE — Telephone Encounter (Signed)
Dr. Agustin Cree...Marland KitchenMarland Kitchen need clarification please.  Patient reports (below and verbally) that you told her she would be released for hip surgery after ICD placement (scheduled for Friday 6/17).   Dr. Curt Bears told her she could get surgery done, from his standpoint, about 4/more weeks after that ICD placement. Patient is wanting to try and schedule hip surgery ASAP and needs release to schedule. Patient is asking:  Does she need heart failure clinic prior to getting clearance all together? Or can she be released and see them after if you feel needed?

## 2020-09-29 ENCOUNTER — Telehealth: Payer: Self-pay | Admitting: Emergency Medicine

## 2020-09-29 NOTE — Telephone Encounter (Signed)
Left message for patient to return call.

## 2020-09-29 NOTE — Telephone Encounter (Signed)
Patient aware Dr. Agustin Cree cannot clear her at this time. She is aware he recommends she she heart failure clinic first.

## 2020-09-29 NOTE — Telephone Encounter (Signed)
Left message for patient to return call regarding surgical clearance to explain why she is not cleared.

## 2020-09-29 NOTE — Telephone Encounter (Signed)
Patient informed Dr. Agustin Cree is not able to clear her right now for surgery and recommends that she see heart failure clinic first.

## 2020-09-29 NOTE — Telephone Encounter (Signed)
Pt is returning call to [facility] from earlier today. Please advise

## 2020-09-30 NOTE — Pre-Procedure Instructions (Signed)
Instructed patient on the following items: Arrival time 1130 Nothing to eat or drink after midnight No meds AM of procedure Responsible person to drive you home and stay with you for 24 hrs Wash with special soap night before and morning of procedure

## 2020-10-01 ENCOUNTER — Emergency Department (HOSPITAL_COMMUNITY)
Admission: EM | Admit: 2020-10-01 | Discharge: 2020-10-01 | Disposition: A | Discharge status: Home / Self Care | Payer: No Typology Code available for payment source | Source: Home / Self Care | Attending: Emergency Medicine | Admitting: Emergency Medicine

## 2020-10-01 ENCOUNTER — Ambulatory Visit (HOSPITAL_COMMUNITY): Payer: No Typology Code available for payment source

## 2020-10-01 ENCOUNTER — Other Ambulatory Visit: Payer: Self-pay

## 2020-10-01 ENCOUNTER — Ambulatory Visit (HOSPITAL_COMMUNITY)
Admission: RE | Admit: 2020-10-01 | Discharge: 2020-10-01 | Disposition: A | Discharge status: Home / Self Care | Payer: No Typology Code available for payment source | Source: Home / Self Care | Attending: Cardiology | Admitting: Cardiology

## 2020-10-01 ENCOUNTER — Inpatient Hospital Stay (HOSPITAL_COMMUNITY)
Admission: RE | Disposition: A | Discharge status: Home / Self Care | Payer: Self-pay | Source: Home / Self Care | Attending: Cardiology

## 2020-10-01 ENCOUNTER — Emergency Department (HOSPITAL_COMMUNITY): Payer: No Typology Code available for payment source

## 2020-10-01 ENCOUNTER — Encounter: Payer: Self-pay | Admitting: Family

## 2020-10-01 DIAGNOSIS — Z853 Personal history of malignant neoplasm of breast: Secondary | ICD-10-CM | POA: Insufficient documentation

## 2020-10-01 DIAGNOSIS — I2510 Atherosclerotic heart disease of native coronary artery without angina pectoris: Secondary | ICD-10-CM | POA: Insufficient documentation

## 2020-10-01 DIAGNOSIS — E119 Type 2 diabetes mellitus without complications: Secondary | ICD-10-CM | POA: Insufficient documentation

## 2020-10-01 DIAGNOSIS — Z886 Allergy status to analgesic agent status: Secondary | ICD-10-CM | POA: Insufficient documentation

## 2020-10-01 DIAGNOSIS — Z955 Presence of coronary angioplasty implant and graft: Secondary | ICD-10-CM | POA: Insufficient documentation

## 2020-10-01 DIAGNOSIS — I110 Hypertensive heart disease with heart failure: Secondary | ICD-10-CM | POA: Insufficient documentation

## 2020-10-01 DIAGNOSIS — Z7984 Long term (current) use of oral hypoglycemic drugs: Secondary | ICD-10-CM | POA: Insufficient documentation

## 2020-10-01 DIAGNOSIS — Z95810 Presence of automatic (implantable) cardiac defibrillator: Secondary | ICD-10-CM | POA: Insufficient documentation

## 2020-10-01 DIAGNOSIS — Z882 Allergy status to sulfonamides status: Secondary | ICD-10-CM | POA: Insufficient documentation

## 2020-10-01 DIAGNOSIS — Z881 Allergy status to other antibiotic agents status: Secondary | ICD-10-CM | POA: Insufficient documentation

## 2020-10-01 DIAGNOSIS — Z79899 Other long term (current) drug therapy: Secondary | ICD-10-CM | POA: Insufficient documentation

## 2020-10-01 DIAGNOSIS — J45909 Unspecified asthma, uncomplicated: Secondary | ICD-10-CM | POA: Insufficient documentation

## 2020-10-01 DIAGNOSIS — I5022 Chronic systolic (congestive) heart failure: Secondary | ICD-10-CM | POA: Insufficient documentation

## 2020-10-01 DIAGNOSIS — T82837A Hemorrhage of cardiac prosthetic devices, implants and grafts, initial encounter: Secondary | ICD-10-CM | POA: Diagnosis not present

## 2020-10-01 DIAGNOSIS — I420 Dilated cardiomyopathy: Secondary | ICD-10-CM | POA: Insufficient documentation

## 2020-10-01 DIAGNOSIS — Z8541 Personal history of malignant neoplasm of cervix uteri: Secondary | ICD-10-CM | POA: Insufficient documentation

## 2020-10-01 DIAGNOSIS — Z888 Allergy status to other drugs, medicaments and biological substances status: Secondary | ICD-10-CM | POA: Insufficient documentation

## 2020-10-01 DIAGNOSIS — E785 Hyperlipidemia, unspecified: Secondary | ICD-10-CM | POA: Insufficient documentation

## 2020-10-01 DIAGNOSIS — R079 Chest pain, unspecified: Secondary | ICD-10-CM

## 2020-10-01 DIAGNOSIS — R5383 Other fatigue: Secondary | ICD-10-CM | POA: Insufficient documentation

## 2020-10-01 DIAGNOSIS — Z87891 Personal history of nicotine dependence: Secondary | ICD-10-CM | POA: Insufficient documentation

## 2020-10-01 DIAGNOSIS — I509 Heart failure, unspecified: Secondary | ICD-10-CM | POA: Insufficient documentation

## 2020-10-01 DIAGNOSIS — E039 Hypothyroidism, unspecified: Secondary | ICD-10-CM | POA: Insufficient documentation

## 2020-10-01 DIAGNOSIS — I428 Other cardiomyopathies: Secondary | ICD-10-CM

## 2020-10-01 DIAGNOSIS — Z7951 Long term (current) use of inhaled steroids: Secondary | ICD-10-CM | POA: Insufficient documentation

## 2020-10-01 DIAGNOSIS — Z7982 Long term (current) use of aspirin: Secondary | ICD-10-CM | POA: Insufficient documentation

## 2020-10-01 DIAGNOSIS — Z95818 Presence of other cardiac implants and grafts: Secondary | ICD-10-CM

## 2020-10-01 DIAGNOSIS — Z885 Allergy status to narcotic agent status: Secondary | ICD-10-CM | POA: Insufficient documentation

## 2020-10-01 HISTORY — PX: ICD IMPLANT: EP1208

## 2020-10-01 LAB — COMPREHENSIVE METABOLIC PANEL
ALT: 16 U/L (ref 0–44)
AST: 16 U/L (ref 15–41)
Albumin: 3.8 g/dL (ref 3.5–5.0)
Alkaline Phosphatase: 60 U/L (ref 38–126)
Anion gap: 9 (ref 5–15)
BUN: 7 mg/dL (ref 6–20)
CO2: 23 mmol/L (ref 22–32)
Calcium: 9.2 mg/dL (ref 8.9–10.3)
Chloride: 103 mmol/L (ref 98–111)
Creatinine, Ser: 0.56 mg/dL (ref 0.44–1.00)
GFR, Estimated: >60 mL/min (ref >60)
Glucose, Bld: 131 mg/dL — ABNORMAL HIGH (ref 70–99)
Potassium: 3.7 mmol/L (ref 3.5–5.1)
Sodium: 135 mmol/L (ref 135–145)
Total Bilirubin: 0.8 mg/dL (ref 0.3–1.2)
Total Protein: 6 g/dL — ABNORMAL LOW (ref 6.5–8.1)

## 2020-10-01 LAB — CBC WITH DIFFERENTIAL/PLATELET
Abs Immature Granulocytes: 0.08 10*3/uL — ABNORMAL HIGH (ref 0.00–0.07)
Basophils Absolute: 0.1 10*3/uL (ref 0.0–0.1)
Basophils Relative: 1 %
Eosinophils Absolute: 0.3 10*3/uL (ref 0.0–0.5)
Eosinophils Relative: 2 %
HCT: 38.3 % (ref 36.0–46.0)
Hemoglobin: 12.9 g/dL (ref 12.0–15.0)
Immature Granulocytes: 1 %
Lymphocytes Relative: 24 %
Lymphs Abs: 4 10*3/uL (ref 0.7–4.0)
MCH: 30 pg (ref 26.0–34.0)
MCHC: 33.7 g/dL (ref 30.0–36.0)
MCV: 89.1 fL (ref 80.0–100.0)
Monocytes Absolute: 1.6 10*3/uL — ABNORMAL HIGH (ref 0.1–1.0)
Monocytes Relative: 10 %
Neutro Abs: 10.5 10*3/uL — ABNORMAL HIGH (ref 1.7–7.7)
Neutrophils Relative %: 62 %
Platelets: 434 10*3/uL — ABNORMAL HIGH (ref 150–400)
RBC: 4.3 MIL/uL (ref 3.87–5.11)
RDW: 12 % (ref 11.5–15.5)
WBC: 16.6 10*3/uL — ABNORMAL HIGH (ref 4.0–10.5)
nRBC: 0 % (ref 0.0–0.2)

## 2020-10-01 LAB — MAGNESIUM: Magnesium: 1.6 mg/dL — ABNORMAL LOW (ref 1.7–2.4)

## 2020-10-01 LAB — GLUCOSE, CAPILLARY
Glucose-Capillary: 102 mg/dL — ABNORMAL HIGH (ref 70–99)
Glucose-Capillary: 120 mg/dL — ABNORMAL HIGH (ref 70–99)

## 2020-10-01 SURGERY — ICD IMPLANT

## 2020-10-01 MED ORDER — HEPARIN (PORCINE) IN NACL 1000-0.9 UT/500ML-% IV SOLN
INTRAVENOUS | Status: AC
  Filled 2020-10-01: qty 500

## 2020-10-01 MED ORDER — SODIUM CHLORIDE 0.9 % IV SOLN
INTRAVENOUS | Status: AC
  Filled 2020-10-01: qty 2

## 2020-10-01 MED ORDER — MIDAZOLAM HCL 5 MG/5ML IJ SOLN
INTRAMUSCULAR | Status: AC
  Filled 2020-10-01: qty 5

## 2020-10-01 MED ORDER — SODIUM CHLORIDE 0.9 % IV SOLN
80.0000 mg | INTRAVENOUS | Status: AC
  Administered 2020-10-01: 80 mg

## 2020-10-01 MED ORDER — CHLORHEXIDINE GLUCONATE 4 % EX LIQD
4.0000 "application " | Freq: Once | CUTANEOUS | Status: DC
  Filled 2020-10-01: qty 60

## 2020-10-01 MED ORDER — ACETAMINOPHEN 325 MG PO TABS
325.0000 mg | ORAL_TABLET | ORAL | Status: DC | PRN
  Administered 2020-10-01: 650 mg via ORAL
  Filled 2020-10-01: qty 2

## 2020-10-01 MED ORDER — VANCOMYCIN HCL IN DEXTROSE 1-5 GM/200ML-% IV SOLN
INTRAVENOUS | Status: AC
  Filled 2020-10-01: qty 200

## 2020-10-01 MED ORDER — MIDAZOLAM HCL 5 MG/5ML IJ SOLN
INTRAMUSCULAR | Status: DC | PRN
  Administered 2020-10-01 (×3): 1 mg via INTRAVENOUS

## 2020-10-01 MED ORDER — ONDANSETRON HCL 4 MG/2ML IJ SOLN: 4.0000 mg | Freq: Four times a day (QID) | INTRAMUSCULAR | Status: DC | PRN

## 2020-10-01 MED ORDER — CYCLOBENZAPRINE HCL 5 MG PO TABS: 5.0000 mg | ORAL_TABLET | Freq: Three times a day (TID) | ORAL | 0 refills | Status: AC | PRN

## 2020-10-01 MED ORDER — VANCOMYCIN HCL 1000 MG/200ML IV SOLN
1000.0000 mg | INTRAVENOUS | Status: AC
  Administered 2020-10-01: 1000 mg via INTRAVENOUS

## 2020-10-01 MED ORDER — VANCOMYCIN HCL 1000 MG/200ML IV SOLN: 1000.0000 mg | Freq: Two times a day (BID) | INTRAVENOUS | Status: DC

## 2020-10-01 MED ORDER — SODIUM CHLORIDE 0.9 % IV SOLN: INTRAVENOUS | Status: DC

## 2020-10-01 MED ORDER — FENTANYL CITRATE (PF) 100 MCG/2ML IJ SOLN
INTRAMUSCULAR | Status: AC
  Filled 2020-10-01: qty 2

## 2020-10-01 MED ORDER — LIDOCAINE HCL (PF) 1 % IJ SOLN
INTRAMUSCULAR | Status: AC
  Filled 2020-10-01: qty 60

## 2020-10-01 MED ORDER — LIDOCAINE HCL (PF) 1 % IJ SOLN
INTRAMUSCULAR | Status: DC | PRN
  Administered 2020-10-01: 60 mL

## 2020-10-01 MED ORDER — FENTANYL CITRATE (PF) 100 MCG/2ML IJ SOLN
INTRAMUSCULAR | Status: DC | PRN
  Administered 2020-10-01 (×3): 25 ug via INTRAVENOUS

## 2020-10-01 MED ORDER — CYCLOBENZAPRINE HCL 10 MG PO TABS
5.0000 mg | ORAL_TABLET | Freq: Once | ORAL | Status: AC
  Administered 2020-10-01: 5 mg via ORAL
  Filled 2020-10-01: qty 1

## 2020-10-01 SURGICAL SUPPLY — 6 items
CABLE SURGICAL S-101-97-12 (CABLE) ×2 IMPLANT
ICD GALLANT VR CDVRA500Q (ICD Generator) ×2 IMPLANT
LEAD DURATA 7122Q-58CM (Lead) ×1 IMPLANT
PAD PRO RADIOLUCENT 2001M-C (PAD) ×2 IMPLANT
SHEATH 7FR PRELUDE SNAP 13 (SHEATH) ×1 IMPLANT
TRAY PACEMAKER INSERTION (PACKS) ×2 IMPLANT

## 2020-10-01 NOTE — ED Triage Notes (Addendum)
Pt bib [facility] from home. Pt was here earlier for defibrillator placement. Per ems, when pt got home, the defibrillator was fired for multiple times. Per ems, pt had 1 pvc during transport and that's when the defibrillator shocked. 50 mg fentanyl given en-route. 4 mg zofran was given as pt vomited once during transport. Pt is still feeling nauseated. A&O * 4   Bp 150/90 Hr 90  98% RA  16 RR

## 2020-10-01 NOTE — ED Notes (Signed)
Cardiology at bedside.

## 2020-10-01 NOTE — Discharge Instructions (Signed)
    Supplemental Discharge Instructions for  Pacemaker/Defibrillator Patients  Tomorrow, 10/02/20, send in a device transmission  Activity No heavy lifting or vigorous activity with your left/right arm for 6 to 8 weeks.  Do not raise your left/right arm above your head for one week.  Gradually raise your affected arm as drawn below.              10/09/20                   10/10/20                    10/11/20                  10/12/20            __  NO DRIVING until cleared to at your wound check visit.  WOUND CARE Keep the wound area clean and dry.  Do not get this area wet , no showers until cleared to at your wound check visit . Tomorrow, 10/02/20, remove the arm sling  Tomorrow, 10/12/20 remove the outer LARGE plastic bandage.  Underneath the plastic bandage there are steri strips (paper tapes), DO NOT remove these. The tape/steri-strips on your wound will fall off; do not pull them off.  No bandage is needed on the site.  DO  NOT apply any creams, oils, or ointments to the wound area. If you notice any drainage or discharge from the wound, any swelling or bruising at the site, or you develop a fever > 101? F after you are discharged home, call the office at once.  Special Instructions You are still able to use cellular telephones; use the ear opposite the side where you have your pacemaker/defibrillator.  Avoid carrying your cellular phone near your device. When traveling through airports, show security personnel your identification card to avoid being screened in the metal detectors.  Ask the security personnel to use the hand wand. Avoid arc welding equipment, MRI testing (magnetic resonance imaging), TENS units (transcutaneous nerve stimulators).  Call the office for questions about other devices. Avoid electrical appliances that are in poor condition or are not properly grounded. Microwave ovens are safe to be near or to operate.  Additional information for defibrillator patients  should your device go off: If your device goes off ONCE and you feel fine afterward, notify the device clinic nurses. If your device goes off ONCE and you do not feel well afterward, call 911. If your device goes off TWICE, call 911. If your device goes off THREE times in one day, call 911.  DO NOT DRIVE YOURSELF OR A FAMILY MEMBER WITH A DEFIBRILLATOR TO THE HOSPITAL--CALL 911.

## 2020-10-01 NOTE — Discharge Instructions (Addendum)
You have been seen and discharged from the emergency department.  Follow-up with your cardiologist and primary provider for reevaluation and further care.  Take muscle relaxer as directed.  Do not mix this medication with alcohol or other sedating medications. Do not drive or do heavy physical activity and to know how this medication affects you.  It may cause drowsiness.  Take home medications as prescribed. If you have any worsening symptoms or further concerns for your health please return to an emergency department for further evaluation.

## 2020-10-01 NOTE — ED Provider Notes (Signed)
[facility] EMERGENCY DEPARTMENT Provider Note   CSN: [id number] Arrival date & time: 10/01/20  2011     History Chief Complaint  Patient presents with   Pacemaker Problem    Erin Fields is a 55 y.o. female.  HPI  55 year old female with past medical history of nonobstructive CAD, dilated cardiomyopathy with CHF that had been medically optimized who is status post defibrillator placement from this morning presents the emergency department with concern for the defibrillator firing.  Patient states when she got home was getting out of the car she felt sharp chest pain, and a shocking fashion.  Patient called an ambulance and in route was reportedly in normal sinus rhythm with no further shocking.  Currently the patient feels fatigued but denies any palpitations or further shock or sensation.  No recent illness, fever, swelling of her lower extremities.  Past Medical History:  Diagnosis Date   Allergic rhinitis due to animal (cat) (dog) hair and dander 04/27/2020   Allergic rhinitis due to pollen 04/27/2020   Anxiety    Anxiety and depression 01/20/2018   Asthma    Asthma, persistent controlled 10/05/2018   B12 deficiency 11/27/2019   Borderline diabetes 12/17/2015   Cancer ([place]) 2019   right breast cancer   CERVICAL CANCER, HX OF 12/06/2006   Qualifier: Diagnosis of  By: Arnoldo Morale MD, John E    Chronic sinusitis 01/26/2015   Chronic venous insufficiency 09/02/2015   Colon polyps    Congestive heart failure ([place]) 04/29/2020   Controlled type 2 diabetes mellitus without complication, without long-term current use of insulin ([place]) 05/28/2017   Coronary artery disease 25% of RCA, 20% intermediate branch, 40% diagonal branch based on cardiac cath from 2022 07/22/2020   Crohn disease ([place]) 11/29/2016   Depression    Diabetes mellitus without complication ([place])    type 2   Dilated cardiomyopathy ([place]) ejection fraction 25% by echocardiogram from January 2022 04/29/2020    Dyspnea 09/15/2014   Elevated triglycerides with high cholesterol 11/26/2013   Environmental allergies    Eustachian tube dysfunction 12/04/2014   Ex-smoker 11/18/2015   Family history of breast cancer    Genetic testing 07/16/2017   Negative genetic testing on the multi-cancer panel.  The Multi-Gene Panel offered by [name] includes sequencing and/or deletion duplication testing of the following 83 genes: ALK, APC, ATM, AXIN2,BAP1,  BARD1, BLM, BMPR1A, BRCA1, BRCA2, BRIP1, CASR, CDC73, CDH1, CDK4, CDKN1B, CDKN1C, CDKN2A (p14ARF), CDKN2A (p16INK4a), CEBPA, CHEK2, CTNNA1, DICER1, DIS3L2, EGFR (c.2369C>T, p.Thr790Met variant onl   GERD (gastroesophageal reflux disease)    HEADACHE 12/06/2006   Qualifier: Diagnosis of  By: Arnoldo Morale MD, John E    History of adenomatous polyp of colon 11/19/2017   Hyperlipidemia    Hypertension    Hypothyroidism    IDA (iron deficiency anemia) 11/06/2019   Impaired fasting glucose 12/12/2017   Insomnia 08/20/2013   Irritable bowel syndrome 11/29/2006   Qualifier: Diagnosis of  By: Arnoldo Morale MD, John E    Left sided abdominal pain 12/13/2017   Liver hemangioma 08/24/2014   Malignant neoplasm of upper-outer quadrant of right breast in female, estrogen receptor positive ([place]) 03/20/2017   Migraines    Mild persistent asthma, uncomplicated 5/70/1779   Nasal polyp, unspecified 01/10/2018   Oral herpes 04/20/2013   Other allergic rhinitis 09/15/2014   Personal history of radiation therapy 2019   36 radiation tx   PONV (postoperative nausea and vomiting)    scopolamine patch helps   Recurrent infections 10/05/2018  Right upper lobe pulmonary nodule 01/10/2018   Symptomatic varicose veins, right 09/02/2015   Ulcerative colitis ([place])    Varicose veins     Patient Active Problem List   Diagnosis Date Noted   Anxiety    Coronary artery disease 25% of RCA, 20% intermediate branch, 40% diagonal branch based on cardiac cath from 2022 07/22/2020   Dilated cardiomyopathy ([place]) ejection  fraction 25% by echocardiogram from January 2022 04/29/2020   Congestive heart failure ([place]) 04/29/2020   Allergic rhinitis due to animal (cat) (dog) hair and dander 04/27/2020   Allergic rhinitis due to pollen 04/27/2020   Mild persistent asthma, uncomplicated 17/91/5056   PONV (postoperative nausea and vomiting)    Migraines    GERD (gastroesophageal reflux disease)    Environmental allergies    Diabetes mellitus without complication ([place])    Depression    Colon polyps    B12 deficiency 11/27/2019   IDA (iron deficiency anemia) 11/06/2019   Asthma, persistent controlled 10/05/2018   Recurrent infections 10/05/2018   Anxiety and depression 01/20/2018   Right upper lobe pulmonary nodule 01/10/2018   Nasal polyp, unspecified 01/10/2018   Left sided abdominal pain 12/13/2017   Impaired fasting glucose 12/12/2017   History of adenomatous polyp of colon 11/19/2017   Genetic testing 07/16/2017   Family history of breast cancer    Controlled type 2 diabetes mellitus without complication, without long-term current use of insulin ([place]) 05/28/2017   Personal history of radiation therapy 2019   Cancer ([place]) 2019   Malignant neoplasm of upper-outer quadrant of right breast in female, estrogen receptor positive ([place]) 03/20/2017   Crohn disease ([place]) 11/29/2016   Borderline diabetes 12/17/2015   Ex-smoker 11/18/2015   Chronic venous insufficiency 09/02/2015   Symptomatic varicose veins, right 09/02/2015   Chronic sinusitis 01/26/2015   Eustachian tube dysfunction 12/04/2014   Other allergic rhinitis 09/15/2014   Dyspnea 09/15/2014   Liver hemangioma 08/24/2014   Elevated triglycerides with high cholesterol 11/26/2013   Insomnia 08/20/2013   Hypertension 08/20/2013   Ulcerative colitis ([place]) 04/20/2013   Oral herpes 04/20/2013   Hyperlipidemia 12/06/2006   HEADACHE 12/06/2006   CERVICAL CANCER, HX OF 12/06/2006   Hypothyroidism 11/29/2006   Asthma 11/29/2006   IRRITABLE BOWEL  SYNDROME 11/29/2006    Past Surgical History:  Procedure Laterality Date   BREAST BIOPSY Right 02/2017   BREAST BIOPSY Right 11/07/2019   BREAST LUMPECTOMY Right 04/24/2017   BREAST LUMPECTOMY WITH RADIOACTIVE SEED AND SENTINEL LYMPH NODE BIOPSY Right 04/24/2017   Procedure: RIGHT BREAST LUMPECTOMY WITH RADIOACTIVE SEED AND SENTINEL LYMPH NODE BIOPSY;  Surgeon: [name], MD;  Location: [facility];  Service: General;  Laterality: Right;   crapal tunnel relase Left 05/2018   ENDOMETRIAL ABLATION  2010   Had abnormal uterine bleeding (heavy and frequent), performed in [place], menses stopped completely 2 years after   EXPLORATORY LAPAROTOMY     Had exploratory surgery at the age of 5 for abdominal pain with cyst found on her left ovary, not treated surgically   FOOT SURGERY Right 2012   HAND SURGERY Right    RE-EXCISION OF BREAST LUMPECTOMY Right 05/15/2017   Procedure: RE-EXCISION OF BREAST LUMPECTOMY;  Surgeon: [name], MD;  Location: [facility];  Service: General;  Laterality: Right;   RIGHT/LEFT HEART CATH AND CORONARY ANGIOGRAPHY N/A 06/21/2020   Procedure: RIGHT/LEFT HEART CATH AND CORONARY ANGIOGRAPHY;  Surgeon: Nelva Bush, MD;  Location: [facility];  Service: Cardiovascular;  Laterality:  N/A;   ROBOTIC ASSISTED SALPINGO OOPHERECTOMY Bilateral 08/29/2019   Procedure: XI ROBOTIC ASSISTED SALPINGO OOPHORECTOMY;  Surgeon: [name], MD;  Location: [facility];  Service: Gynecology;  Laterality: Bilateral;   Septoplasty with turbinate reduction     TONSILLECTOMY  1980   VEIN SURGERY     Removal Right Leg   WISDOM TOOTH EXTRACTION       OB History     Gravida  3   Para  2   Term      Preterm      AB  1   Living  1      SAB      IAB      Ectopic      Multiple      Live Births              Family History  Problem Relation Age of Onset   Arthritis Mother 87       Deceased   Breast  cancer Mother    Lung cancer Mother    Hyperlipidemia Mother    Heart disease Mother    Hypertension Mother    Diabetes Mother    Crohn's disease Mother    Diabetes Maternal Grandfather    Heart disease Maternal Grandfather    Heart attack Maternal Grandfather    Leukemia Maternal Grandfather    Colitis Sister    Leukemia Cousin 3       mat cousin   Lung cancer Father    Brain cancer Father     Social History   Tobacco Use   Smoking status: Former    Packs/day: 3.00    Years: 15.00    Pack years: 45.00    Types: Cigarettes    Quit date: 09/14/1993    Years since quitting: 27.0   Smokeless tobacco: Never   Tobacco comments:    Quit 26 yrs ago  Vaping Use   Vaping Use: Never used  Substance Use Topics   Alcohol use: Yes    Comment: Drinks a couple every 2-3 months   Drug use: No    Home Medications Prior to Admission medications   Medication Sig Start Date End Date Taking? Authorizing Provider  acyclovir (ZOVIRAX) 400 MG tablet TAKE 1 TABLET (400 MG TOTAL) BY MOUTH 3 (THREE) TIMES DAILY AS NEEDED. 09/24/19   Copland, Gay Filler, MD  Adalimumab 80 MG/0.8ML PNKT Inject 80 mg into the skin every 14 (fourteen) days. 12/01/19   [provider]  albuterol (VENTOLIN HFA) 108 (90 Base) MCG/ACT inhaler Inhale 2 puffs into the lungs every 6 (six) hours as needed for shortness of breath. 09/22/20   [provider]  ascorbic acid (VITAMIN C) 1000 MG tablet Take 1,000 mg by mouth daily.    [provider]  aspirin EC 81 MG tablet Take 81 mg by mouth daily. Swallow whole.    [provider]  Azelastine HCl 0.15 % SOLN Place 2 sprays into both nostrils 2 (two) times daily. For 30 days 07/16/20   [provider]  Biotin 1000 MCG tablet Take 1,000 mcg by mouth daily.    [provider]  Calcium Carb-Cholecalciferol (CALCIUM+D3) 600-800 MG-UNIT TABS Take 1 tablet by mouth daily.    [provider]  carvedilol (COREG) 12.5 MG tablet  Take 1 tablet (12.5 mg total) by mouth 2 (two) times daily. 08/24/20   Park Liter, MD  cetirizine (ZYRTEC) 10 MG tablet Take 10 mg by mouth  daily.    [provider]  diphenoxylate-atropine (LOMOTIL) 2.5-0.025 MG tablet Take 2 tablets by mouth 4 (four) times daily as needed for diarrhea or loose stools.    [provider]  doxycycline (VIBRAMYCIN) 100 MG capsule Take 1 capsule (100 mg total) by mouth 2 (two) times daily. 09/27/20   Copland, Gay Filler, MD  ENTRESTO 49-51 MG TAKE 1 TABLET BY MOUTH TWICE A DAY 09/06/20   Park Liter, MD  EPINEPHrine 0.3 mg/0.3 mL IJ SOAJ injection Inject 0.3 mg into the muscle as needed for anaphylaxis.    [provider]  exemestane (AROMASIN) 25 MG tablet Take 25 mg by mouth daily after breakfast.    [provider]  Glucosamine-Chondroit-Vit C-Mn (GLUCOSAMINE-CHONDROITIN) CAPS Take 1 capsule by mouth daily.    [provider]  HYDROcodone-acetaminophen (NORCO/VICODIN) 5-325 MG tablet Take 1 tablet by mouth every 6 (six) hours as needed for up to 20 doses for severe pain or moderate pain. 09/16/20   Copland, Gay Filler, MD  Hyoscyamine Sulfate 0.375 MG TBCR Take 0.375 mg by mouth daily.    [provider]  lansoprazole (PREVACID SOLUTAB) 15 MG disintegrating tablet Take 15 mg by mouth 2 (two) times daily before a meal.    [provider]  levothyroxine (SYNTHROID) 75 MCG tablet Take 1 tablet (75 mcg total) by mouth daily before breakfast. 09/27/20   Copland, Gay Filler, MD  MAGNESIUM PO Take 250 mg by mouth daily.    [provider]  meloxicam (MOBIC) 15 MG tablet Take 15 mg by mouth daily as needed for pain (Hip pain).    [provider]  Menthol, Topical Analgesic, (BIOFREEZE COLORLESS) 4 % GEL Apply 1 application topically daily as needed (Hip pain).    [provider]  mesalamine (LIALDA) 1.2 g EC tablet Take 2.4 g by mouth in the morning and at bedtime.     [provider]  metFORMIN (GLUCOPHAGE) 500 MG tablet Take 1 tablet (500 mg total) by mouth 2 (two) times daily with a meal. 06/24/20   End, Harrell Gave, MD  montelukast (SINGULAIR) 10 MG tablet Take 10 mg by mouth at bedtime.     [provider]  Omega 3 1000 MG CAPS Take 2,000 mg by mouth every other day.    [provider]  promethazine (PHENERGAN) 25 MG tablet Take 0.5-1 tablets (12.5-25 mg total) by mouth daily as needed for nausea or vomiting. 04/13/20   Copland, Gay Filler, MD  rosuvastatin (CRESTOR) 20 MG tablet Take 1 tablet (20 mg total) by mouth daily. 06/18/19   Copland, Gay Filler, MD  spironolactone (ALDACTONE) 25 MG tablet Take 0.5 tablets (12.5 mg total) by mouth daily. 07/30/20 10/28/20  Park Liter, MD  SYMBICORT 160-4.5 MCG/ACT inhaler INHALE 2 PUFFS INTO THE LUNGS TWICE A DAY 09/06/20   Saguier, Percell Miller, PA-C  triamcinolone (NASACORT) 55 MCG/ACT AERO nasal inhaler Place 2 sprays into the nose daily.    [provider]  Turmeric 500 MG CAPS Take 500 mg by mouth daily.    [provider]  venlafaxine XR (EFFEXOR-XR) 150 MG 24 hr capsule Take 1 capsule (150 mg total) by mouth daily with breakfast. 05/10/20   Copland, Gay Filler, MD  zinc gluconate 50 MG tablet Take 50 mg by mouth daily.    [provider]  zolpidem (AMBIEN) 5 MG tablet TAKE 1 TABLET BY MOUTH EVERY DAY AT BEDTIME AS NEEDED FOR SLEEP 09/09/20   Copland, Gay Filler, MD  Allergies    Losartan potassium, Ace inhibitors, Aspirin, Cefaclor, Oxycodone, Sulfa antibiotics, and Lisinopril  Review of Systems   Review of Systems  Constitutional:  Positive for fatigue. Negative for chills and fever.  HENT:  Negative for congestion.   Eyes:  Negative for visual disturbance.  Respiratory:  Negative for chest tightness and shortness of breath.   Cardiovascular:  Negative for chest pain, palpitations and leg swelling.       + defibrillator firing  Gastrointestinal:  Negative for  abdominal pain, diarrhea and vomiting.  Genitourinary:  Negative for dysuria.  Skin:  Negative for rash.  Neurological:  Negative for headaches.   Physical Exam Updated Vital Signs BP (!) 183/102   Pulse 80   Resp 20   SpO2 98%   Physical Exam Vitals and nursing note reviewed.  Constitutional:      General: She is not in acute distress.    Appearance: Normal appearance.  HENT:     Head: Normocephalic.     Mouth/Throat:     Mouth: Mucous membranes are moist.  Cardiovascular:     Rate and Rhythm: Normal rate.  Pulmonary:     Effort: Pulmonary effort is normal. No respiratory distress.  Abdominal:     Palpations: Abdomen is soft.     Tenderness: There is no abdominal tenderness.  Musculoskeletal:        General: No swelling.  Skin:    General: Skin is warm.  Neurological:     Mental Status: She is alert and oriented to person, place, and time. Mental status is at baseline.  Psychiatric:        Mood and Affect: Mood normal.    ED Results / Procedures / Treatments   Labs (all labs ordered are listed, but only abnormal results are displayed) Labs Reviewed  CBC WITH DIFFERENTIAL/PLATELET  COMPREHENSIVE METABOLIC PANEL  MAGNESIUM    EKG EKG Interpretation  Date/Time:  Friday October 01 2020 20:21:47 EDT Ventricular Rate:  83 PR Interval:  185 QRS Duration: 95 QT Interval:  419 QTC Calculation: 493 R Axis:   32 Text Interpretation: Sinus rhythm Borderline prolonged QT interval Confirmed by Lavenia Atlas [phone]) on 10/01/2020 8:23:07 PM  Radiology DG Chest 2 View  Result Date: 10/01/2020 CLINICAL DATA:  Status post defibrillator placement EXAM: CHEST - 2 VIEW COMPARISON:  06/15/2020 FINDINGS: Defibrillator is now noted in satisfactory position. Cardiac shadow is stable. Aortic calcifications are noted. No pneumothorax is seen. The lungs are clear. Postoperative changes in the right breast are again seen. IMPRESSION: No evidence of post placement pneumothorax. No acute  abnormality noted. Electronically Signed   By: Inez Catalina M.D.   On: 10/01/2020 17:59   EP PPM/ICD IMPLANT  Result Date: 10/01/2020  SURGEON:  Allegra Lai, MD    PREPROCEDURE DIAGNOSES:  1. Nonichemic cardiomyopathy.  2. New York Heart Association class III, heart failure chronically.    POSTPROCEDURE DIAGNOSES:  1. Nonischemic cardiomyopathy.  2. New York Heart Association class III heart failure chronically.    PROCEDURES:   1. ICD implantation.  2. Left upper extremity venography  3. Defibrillation threshold testing    INTRODUCTION: Erin Fields is a 55 y.o. female with a nonischemic CM (EF25%), NYHA Class III CHF, and CAD. At this time, she meets MADIT II/ SCD-HeFT criteria for ICD implantation for primary prevention of sudden death.  The patient has a narrow QRS and does not meet criteria for revascularization.  The patient has been treated with an optimal medical  regimen but continues to have a depressed ejection fraction and NYHA Class II CHF symptoms.  The patient therefore  presents today for ICD implantation.    DESCRIPTION OF PROCEDURE:  Informed written consent was obtained and the patient was brought to the electrophysiology lab in the fasting state. The patient was adequately sedated with intravenous Versed, and fentanyl as outlined in the nursing report.  The patient's left chest was prepped and draped in the usual sterile fashion by the EP lab staff.  The skin overlying the left deltopectoral region was infiltrated with lidocaine for local analgesia.  A 5-cm incision was made over the left deltopectoral region.  A left subcutaneous defibrillator pocket was fashioned using a combination of sharp and blunt dissection.  Electrocautery was used to assure hemostasis. Left Upper extremity Venography:  A venogram of the left upper extremity was performed which revealed a moderate sized left axillary vein which emptied into a moderate sized left subclavian vein.  RV Lead Placement: The left  axillary vein was cannulated with fluoroscopic visualization.  No contrast was required for this endeavor.  Through the left axillary vein, a [employer] Sipsey, model 7122Q-65 (serial number [id number]) right ventricular defibrillator lead was advanced with fluoroscopic visualization into the right ventricular apex.  Initial right ventricular lead R-wave measured 11.4 mV with impedance of 822 ohms and a threshold of 0.8 volts at 0.5 milliseconds. The leads were secured to the pectoralis  fascia using #2 silk suture over the suture sleeves.  The pocket then  irrigated with copious gentamicin solution.  The leads were then  connected to an [place] (serial  Number [id number]) ICD.  The defibrillator was placed into the  pocket.  The pocket was then closed in 3 layers with 2.0 Vicryl suture  for the subcutaneous and 3.0 Vicryl suture subcuticular layers.  EBL<110m. Steri-Strips and a  sterile dressing were then applied.    CONCLUSIONS:  1. Ischemic cardiomyopathy with chronic New York Heart Association class II heart failure.  2. Successful ICD implantation.  3. DFT less than or equal to 15 joules.  4. No inducible VT or VF with PES  5. No early apparent complications.    Procedures Procedures   Medications Ordered in ED Medications - No data to display  ED Course  I have reviewed the triage vital signs and the nursing notes.  Pertinent labs & imaging results that were available during my care of the patient were reviewed by me and considered in my medical decision making (see chart for details).    MDM Rules/Calculators/A&P                          56year old female with defibrillator placement this morning presents the emergency department with pain in her chest, concern for device firing.  EKG shows normal sinus rhythm here, vitals are normal.  No continuous tracking noted on exam.  Interrogation shows no arrhythmic events or shocking.  Blood work seems appropriate, chest x-ray  shows no acute finding.  Consulted cardiology fellow who reviewed her device interrogation.  They agree that there does not seem to be any device malfunction or continuous shocking.  Believe that this is most likely chest wall pain, recommend muscle relaxers.  We will send a prescription and she will follow-up as outpatient.  Patient will be discharged and treated as an outpatient.  Discharge plan and strict return to ED precautions discussed, patient verbalizes understanding and agreement.  Final Clinical Impression(s) / ED Diagnoses Final diagnoses:  None    Rx / DC Orders ED Discharge Orders     None        Lorelle Gibbs, DO 10/01/20 2327

## 2020-10-01 NOTE — H&P (Signed)
Electrophysiology Office Note   Date:  10/01/2020   ID:  Erin Fields, DOB 25-Jun-1965, MRN [id number]  PCP:  Darreld Mclean, MD  Cardiologist: Agustin Cree Primary Electrophysiologist:  Erin Gabbert Meredith Leeds, MD    Chief Complaint: CHF   History of Present Illness: Erin Fields is a 55 y.o. female who is being seen today for the evaluation of CHF at the request of No ref. provider found. Presenting today for electrophysiology evaluation.  She has a history significant for right-sided breast cancer, type 2 diabetes, nonobstructive coronary artery disease, hypertension, hyperlipidemia.  She presented to cardiology clinic January 2022 after being found to have an abnormal ECG and prep for hip replacement surgery.  An echo showed an ejection fraction of 25%.  She was put on optimal medical therapy.  Left heart catheterization showed nonobstructive coronary artery disease.  She presented back to cardiology clinic complaining of exertional shortness of breath and hip pain. Today, denies symptoms of palpitations, chest pain, shortness of breath, orthopnea, PND, lower extremity edema, claudication, dizziness, presyncope, syncope, bleeding, or neurologic sequela. The patient is tolerating medications without difficulties. Plan ICD today.    Past Medical History:  Diagnosis Date   Allergic rhinitis due to animal (cat) (dog) hair and dander 04/27/2020   Allergic rhinitis due to pollen 04/27/2020   Anxiety    Anxiety and depression 01/20/2018   Asthma    Asthma, persistent controlled 10/05/2018   B12 deficiency 11/27/2019   Borderline diabetes 12/17/2015   Cancer ([place]) 2019   right breast cancer   CERVICAL CANCER, HX OF 12/06/2006   Qualifier: Diagnosis of  By: Arnoldo Morale MD, John E    Chronic sinusitis 01/26/2015   Chronic venous insufficiency 09/02/2015   Colon polyps    Congestive heart failure ([place]) 04/29/2020   Controlled type 2 diabetes mellitus without complication, without long-term  current use of insulin ([place]) 05/28/2017   Coronary artery disease 25% of RCA, 20% intermediate branch, 40% diagonal branch based on cardiac cath from 2022 07/22/2020   Crohn disease ([place]) 11/29/2016   Depression    Diabetes mellitus without complication ([place])    type 2   Dilated cardiomyopathy ([place]) ejection fraction 25% by echocardiogram from January 2022 04/29/2020   Dyspnea 09/15/2014   Elevated triglycerides with high cholesterol 11/26/2013   Environmental allergies    Eustachian tube dysfunction 12/04/2014   Ex-smoker 11/18/2015   Family history of breast cancer    Genetic testing 07/16/2017   Negative genetic testing on the multi-cancer panel.  The Multi-Gene Panel offered by [name] includes sequencing and/or deletion duplication testing of the following 83 genes: ALK, APC, ATM, AXIN2,BAP1,  BARD1, BLM, BMPR1A, BRCA1, BRCA2, BRIP1, CASR, CDC73, CDH1, CDK4, CDKN1B, CDKN1C, CDKN2A (p14ARF), CDKN2A (p16INK4a), CEBPA, CHEK2, CTNNA1, DICER1, DIS3L2, EGFR (c.2369C>T, p.Thr790Met variant onl   GERD (gastroesophageal reflux disease)    HEADACHE 12/06/2006   Qualifier: Diagnosis of  By: Arnoldo Morale MD, John E    History of adenomatous polyp of colon 11/19/2017   Hyperlipidemia    Hypertension    Hypothyroidism    IDA (iron deficiency anemia) 11/06/2019   Impaired fasting glucose 12/12/2017   Insomnia 08/20/2013   Irritable bowel syndrome 11/29/2006   Qualifier: Diagnosis of  By: Arnoldo Morale MD, John E    Left sided abdominal pain 12/13/2017   Liver hemangioma 08/24/2014   Malignant neoplasm of upper-outer quadrant of right breast in female, estrogen receptor positive ([place]) 03/20/2017   Migraines    Mild persistent asthma, uncomplicated 5/97/4163  Nasal polyp, unspecified 01/10/2018   Oral herpes 04/20/2013   Other allergic rhinitis 09/15/2014   Personal history of radiation therapy 2019   36 radiation tx   PONV (postoperative nausea and vomiting)    scopolamine patch helps   Recurrent infections 10/05/2018   Right  upper lobe pulmonary nodule 01/10/2018   Symptomatic varicose veins, right 09/02/2015   Ulcerative colitis ([place])    Varicose veins    Past Surgical History:  Procedure Laterality Date   BREAST BIOPSY Right 02/2017   BREAST BIOPSY Right 11/07/2019   BREAST LUMPECTOMY Right 04/24/2017   BREAST LUMPECTOMY WITH RADIOACTIVE SEED AND SENTINEL LYMPH NODE BIOPSY Right 04/24/2017   Procedure: RIGHT BREAST LUMPECTOMY WITH RADIOACTIVE SEED AND SENTINEL LYMPH NODE BIOPSY;  Surgeon: [name], MD;  Location: [facility];  Service: General;  Laterality: Right;   crapal tunnel relase Left 05/2018   ENDOMETRIAL ABLATION  2010   Had abnormal uterine bleeding (heavy and frequent), performed in [place], menses stopped completely 2 years after   EXPLORATORY LAPAROTOMY     Had exploratory surgery at the age of 22 for abdominal pain with cyst found on her left ovary, not treated surgically   FOOT SURGERY Right 2012   HAND SURGERY Right    RE-EXCISION OF BREAST LUMPECTOMY Right 05/15/2017   Procedure: RE-EXCISION OF BREAST LUMPECTOMY;  Surgeon: [name], MD;  Location: [facility];  Service: General;  Laterality: Right;   RIGHT/LEFT HEART CATH AND CORONARY ANGIOGRAPHY N/A 06/21/2020   Procedure: RIGHT/LEFT HEART CATH AND CORONARY ANGIOGRAPHY;  Surgeon: Nelva Bush, MD;  Location: [facility];  Service: Cardiovascular;  Laterality: N/A;   ROBOTIC ASSISTED SALPINGO OOPHERECTOMY Bilateral 08/29/2019   Procedure: XI ROBOTIC ASSISTED SALPINGO OOPHORECTOMY;  Surgeon: [name], MD;  Location: [facility];  Service: Gynecology;  Laterality: Bilateral;   Septoplasty with turbinate reduction     TONSILLECTOMY  1980   VEIN SURGERY     Removal Right Leg   WISDOM TOOTH EXTRACTION       Current Facility-Administered Medications  Medication Dose Route Frequency Provider Last Rate Last Admin   0.9 %  sodium chloride infusion   Intravenous Continuous  Beda Dula Hassell Done, MD 50 mL/hr at 10/01/20 1230 New Bag at 10/01/20 1230   chlorhexidine (HIBICLENS) 4 % liquid 4 application  4 application Topical Once Dallas Torok Hassell Done, MD       gentamicin (GARAMYCIN) 80 mg in sodium chloride 0.9 % 500 mL irrigation  80 mg Irrigation On Call Louanne Calvillo Hassell Done, MD       vancomycin (VANCOREADY) IVPB 1000 mg/200 mL  1,000 mg Intravenous On Call Arrionna Serena, Ocie Doyne, MD        Allergies:   Losartan potassium, Ace inhibitors, Aspirin, Cefaclor, Oxycodone, Sulfa antibiotics, and Lisinopril   Social History:  The patient  reports that she quit smoking about 27 years ago. Her smoking use included cigarettes. She has a 45.00 pack-year smoking history. She has never used smokeless tobacco. She reports current alcohol use. She reports that she does not use drugs.   Family History:  The patient's family history includes Arthritis (age of onset: 9) in her mother; Brain cancer in her father; Breast cancer in her mother; Colitis in her sister; Crohn's disease in her mother; Diabetes in her maternal grandfather and mother; Heart attack in her maternal grandfather; Heart disease in her maternal grandfather and mother; Hyperlipidemia in her mother; Hypertension in her mother; Leukemia in her  maternal grandfather; Leukemia (age of onset: 64) in her cousin; Lung cancer in her father and mother.   ROS:  Please see the history of present illness.   Otherwise, review of systems is positive for none.   All other systems are reviewed and negative.   PHYSICAL EXAM: VS:  BP (!) 124/95   Pulse 86   Temp 98.2 F (36.8 C) (Oral)   Resp 17   Ht 5' 3.5" (1.613 m)   Wt 70.8 kg   SpO2 98%   BMI 27.20 kg/m  , BMI Body mass index is 27.2 kg/m. GEN: Well nourished, well developed, in no acute distress  HEENT: normal  Neck: no JVD, carotid bruits, or masses Cardiac: RRR; no murmurs, rubs, or gallops,no edema  Respiratory:  clear to auscultation bilaterally, normal work of  breathing GI: soft, nontender, nondistended, + BS MS: no deformity or atrophy  Skin: warm and dry Neuro:  Strength and sensation are intact Psych: euthymic mood, full affect   Recent Labs: 08/13/2020: ALT 13 09/23/2020: BUN 6; Creatinine, Ser 0.55; Hemoglobin 12.8; Platelets 452; Potassium 5.1; Sodium 136 09/27/2020: TSH 0.31    Lipid Panel     Component Value Date/Time   CHOL 128 08/10/2020 0936   TRIG 153 (H) 08/10/2020 0936   HDL 37 (L) 08/10/2020 0936   CHOLHDL 3.5 08/10/2020 0936   CHOLHDL 4 06/18/2019 0952   VLDL 31.8 06/18/2019 0952   LDLCALC 65 08/10/2020 0936   LDLDIRECT 101.0 12/02/2018 0955     Wt Readings from Last 3 Encounters:  10/01/20 70.8 kg  09/27/20 70.8 kg  08/31/20 71.2 kg      Other studies Reviewed: Additional studies/ records that were reviewed today include: TTE 07/23/20  Review of the above records today demonstrates:   1. Left ventricular ejection fraction, by estimation, is 20 to 25%. The  left ventricle has severely decreased function. The left ventricle  demonstrates global hypokinesis. The left ventricular internal cavity size  was mildly dilated. Left ventricular  diastolic parameters are indeterminate.   2. Right ventricular systolic function is normal. The right ventricular  size is normal.   3. The mitral valve is normal in structure. Mild mitral valve  regurgitation. No evidence of mitral stenosis.   4. The aortic valve is tricuspid. Aortic valve regurgitation is not  visualized. No aortic stenosis is present.   5. The inferior vena cava is normal in size with greater than 50%  respiratory variability, suggesting right atrial pressure of 3 mmHg.   RHC/LHC 06/21/20 Mild to moderate, non-obstructive coronary artery disease. Normal left and right heart filling pressures. Mildly reduced Fick cardiac output/index.   ASSESSMENT AND PLAN:  1.  Chronic systolic heart failure due to nonischemic cardiomyopathy: Erin Fields has  presented today for surgery, with the diagnosis of CHF.  The various methods of treatment have been discussed with the patient and family. After consideration of risks, benefits and other options for treatment, the patient has consented to  Procedure(s): Pacemaker implant as a surgical intervention .  Risks include but not limited to bleeding, infection, pneumothorax, perforation, tamponade, vascular damage, renal failure, MI, stroke, death, and lead dislodgement . The patient's history has been reviewed, patient examined, no change in status, stable for surgery.  I have reviewed the patient's chart and labs.  Questions were answered to the patient's satisfaction.    ICD Criteria  Current LVEF:20-25%. Within 12 months prior to implant: Yes   Heart failure history: Yes, Class II  Cardiomyopathy history: Yes, Non-Ischemic Cardiomyopathy.  Atrial Fibrillation/Atrial Flutter: No.  Ventricular tachycardia history: No.  Cardiac arrest history: No.  History of syndromes with risk of sudden death: No.  Previous ICD: No.  Current ICD indication: Primary  PPM indication: No.  Class I or II Bradycardia indication present: No  Beta Blocker therapy for 3 or more months: Yes, prescribed.   Ace Inhibitor/ARB therapy for 3 or more months: Yes, prescribed.    I have seen Erin Fields is a 55 y.o. femalepre-procedural and has been referred by Agustin Cree for consideration of ICD implant for primary prevention of sudden death.  The patient's chart has been reviewed and they meet criteria for ICD implant.  I have had a thorough discussion with the patient reviewing options.  The patient and their family (if available) have had opportunities to ask questions and have them answered. The patient and I have decided together through the [place] Support Tool to implant ICD at this time.  Risks, benefits, alternatives to ICD implantation were discussed in detail with the patient today.  The patient  understands that the risks include but are not limited to bleeding, infection, pneumothorax, perforation, tamponade, vascular damage, renal failure, MI, stroke, death, inappropriate shocks, and lead dislodgement and  wishes to proceed.

## 2020-10-01 NOTE — Consult Note (Signed)
Cardiology Consult    Patient ID: Erin Fields MRN: [id number], DOB/AGE: 1966/01/04   Admit date: 10/01/2020 Date of Consult: 10/01/2020 Requesting Provider: Lavenia Atlas, DO  PCP:  Darreld Mclean, MD   [facility] Providers Cardiologist:  Jenne Campus, MD       Patient Profile    55 year old female with history of NICM with dilated cardiomyopathy who underwent SJM single chamber AICD on whom we were consulted for possible ICD shock.  History of Present Illness    This is a55 year old female with pertinent CV history of NICM s/p SJM single chamber ICD earlier today who was discharged same day.  She arrived at her home this evening and, upon exiting her vehicle, felt severe chest pain and thought that she was shocked by her device.  She called EMS who brought her to the ED and didn't notice any device discharges.  She describes sharp chest pain radiating from the insertion site down the left side of her chest wall that occurs periodically without any clear provoking factors.  No significant shortness of breath.  She has noticed a bit of  bruising around the device site.  The chest pain is not particularly positional and does not have pleuritic component.  She was CP free at the time of my evaluation.  Device interrogated at the bedside and shows normal sensing, capture @ 0.75 in the V, normal impedance ~500 ohm to can, no arrhythmia, and no therapies delivered.  I performed a bedside cardiac ultrasound and there was no apparent pericardial effusion.  Past Medical History   Past Medical History:  Diagnosis Date   Allergic rhinitis due to animal (cat) (dog) hair and dander 04/27/2020   Allergic rhinitis due to pollen 04/27/2020   Anxiety    Anxiety and depression 01/20/2018   Asthma    Asthma, persistent controlled 10/05/2018   B12 deficiency 11/27/2019   Borderline diabetes 12/17/2015   Cancer ([place]) 2019   right breast cancer   CERVICAL CANCER, HX OF 12/06/2006    Qualifier: Diagnosis of  By: Arnoldo Morale MD, John E    Chronic sinusitis 01/26/2015   Chronic venous insufficiency 09/02/2015   Colon polyps    Congestive heart failure ([place]) 04/29/2020   Controlled type 2 diabetes mellitus without complication, without long-term current use of insulin ([place]) 05/28/2017   Coronary artery disease 25% of RCA, 20% intermediate branch, 40% diagonal branch based on cardiac cath from 2022 07/22/2020   Crohn disease ([place]) 11/29/2016   Depression    Diabetes mellitus without complication ([place])    type 2   Dilated cardiomyopathy ([place]) ejection fraction 25% by echocardiogram from January 2022 04/29/2020   Dyspnea 09/15/2014   Elevated triglycerides with high cholesterol 11/26/2013   Environmental allergies    Eustachian tube dysfunction 12/04/2014   Ex-smoker 11/18/2015   Family history of breast cancer    Genetic testing 07/16/2017   Negative genetic testing on the multi-cancer panel.  The Multi-Gene Panel offered by [name] includes sequencing and/or deletion duplication testing of the following 83 genes: ALK, APC, ATM, AXIN2,BAP1,  BARD1, BLM, BMPR1A, BRCA1, BRCA2, BRIP1, CASR, CDC73, CDH1, CDK4, CDKN1B, CDKN1C, CDKN2A (p14ARF), CDKN2A (p16INK4a), CEBPA, CHEK2, CTNNA1, DICER1, DIS3L2, EGFR (c.2369C>T, p.Thr790Met variant onl   GERD (gastroesophageal reflux disease)    HEADACHE 12/06/2006   Qualifier: Diagnosis of  By: Arnoldo Morale MD, John E    History of adenomatous polyp of colon 11/19/2017   Hyperlipidemia    Hypertension  Hypothyroidism    IDA (iron deficiency anemia) 11/06/2019   Impaired fasting glucose 12/12/2017   Insomnia 08/20/2013   Irritable bowel syndrome 11/29/2006   Qualifier: Diagnosis of  By: Arnoldo Morale MD, John E    Left sided abdominal pain 12/13/2017   Liver hemangioma 08/24/2014   Malignant neoplasm of upper-outer quadrant of right breast in female, estrogen receptor positive ([place]) 03/20/2017   Migraines    Mild persistent asthma, uncomplicated 0/35/0093   Nasal polyp,  unspecified 01/10/2018   Oral herpes 04/20/2013   Other allergic rhinitis 09/15/2014   Personal history of radiation therapy 2019   36 radiation tx   PONV (postoperative nausea and vomiting)    scopolamine patch helps   Recurrent infections 10/05/2018   Right upper lobe pulmonary nodule 01/10/2018   Symptomatic varicose veins, right 09/02/2015   Ulcerative colitis ([place])    Varicose veins     Past Surgical History:  Procedure Laterality Date   BREAST BIOPSY Right 02/2017   BREAST BIOPSY Right 11/07/2019   BREAST LUMPECTOMY Right 04/24/2017   BREAST LUMPECTOMY WITH RADIOACTIVE SEED AND SENTINEL LYMPH NODE BIOPSY Right 04/24/2017   Procedure: RIGHT BREAST LUMPECTOMY WITH RADIOACTIVE SEED AND SENTINEL LYMPH NODE BIOPSY;  Surgeon: [name], MD;  Location: [place];  Service: General;  Laterality: Right;   crapal tunnel relase Left 05/2018   ENDOMETRIAL ABLATION  2010   Had abnormal uterine bleeding (heavy and frequent), performed in [place], menses stopped completely 2 years after   EXPLORATORY LAPAROTOMY     Had exploratory surgery at the age of 60 for abdominal pain with cyst found on her left ovary, not treated surgically   FOOT SURGERY Right 2012   HAND SURGERY Right    RE-EXCISION OF BREAST LUMPECTOMY Right 05/15/2017   Procedure: RE-EXCISION OF BREAST LUMPECTOMY;  Surgeon: [name], MD;  Location: [place];  Service: General;  Laterality: Right;   RIGHT/LEFT HEART CATH AND CORONARY ANGIOGRAPHY N/A 06/21/2020   Procedure: RIGHT/LEFT HEART CATH AND CORONARY ANGIOGRAPHY;  Surgeon: Nelva Bush, MD;  Location: [place] [facility];  Service: Cardiovascular;  Laterality: N/A;   ROBOTIC ASSISTED SALPINGO OOPHERECTOMY Bilateral 08/29/2019   Procedure: XI ROBOTIC ASSISTED SALPINGO OOPHORECTOMY;  Surgeon: [name], MD;  Location: [facility];  Service: Gynecology;  Laterality: Bilateral;   Septoplasty with turbinate reduction      TONSILLECTOMY  1980   VEIN SURGERY     Removal Right Leg   WISDOM TOOTH EXTRACTION       Allergies  Allergen Reactions   Losartan Potassium Shortness Of Breath and Other (See Comments)   Ace Inhibitors Cough   Aspirin Other (See Comments)    Upset stomach   Cefaclor Hives and Other (See Comments)   Oxycodone Hives   Sulfa Antibiotics Hives   Lisinopril Cough and Other (See Comments)   Inpatient Medications     cyclobenzaprine  5 mg Oral Once    Family History    Family History  Problem Relation Age of Onset   Arthritis Mother 81       Deceased   Breast cancer Mother    Lung cancer Mother    Hyperlipidemia Mother    Heart disease Mother    Hypertension Mother    Diabetes Mother    Crohn's disease Mother    Diabetes Maternal Grandfather    Heart disease Maternal Grandfather    Heart attack Maternal Grandfather    Leukemia Maternal Grandfather  Colitis Sister    Leukemia Cousin 3       mat cousin   Lung cancer Father    Brain cancer Father    She indicated that her mother is deceased. She indicated that the status of her father is unknown. She indicated that her sister is alive. She indicated that her maternal grandmother is deceased. She indicated that her maternal grandfather is deceased. She indicated that her maternal aunt is deceased. She indicated that her maternal uncle is alive. She indicated that her cousin is alive.   Social History    Social History   Socioeconomic History   Marital status: Widowed    Spouse name: Not on file   Number of children: Not on file   Years of education: Not on file   Highest education level: Not on file  Occupational History   Not on file  Tobacco Use   Smoking status: Former    Packs/day: 3.00    Years: 15.00    Pack years: 45.00    Types: Cigarettes    Quit date: 09/14/1993    Years since quitting: 27.0   Smokeless tobacco: Never   Tobacco comments:    Quit 26 yrs ago  Vaping Use   Vaping Use: Never used   Substance and Sexual Activity   Alcohol use: Yes    Comment: Drinks a couple every 2-3 months   Drug use: No   Sexual activity: Not Currently    Comment: ablation  Other Topics Concern   Not on file  Social History Narrative   Not on file   Social Determinants of Health   Financial Resource Strain: Not on file  Food Insecurity: Not on file  Transportation Needs: Not on file  Physical Activity: Not on file  Stress: Not on file  Social Connections: Not on file  Intimate Partner Violence: Not on file     Review of Systems    General:  No chills, fever, night sweats or weight changes.  Cardiovascular:  No chest pain, dyspnea on exertion, edema, orthopnea, palpitations, paroxysmal nocturnal dyspnea. Dermatological: No rash, lesions/masses Respiratory: No cough, dyspnea Urologic: No hematuria, dysuria Abdominal:   No nausea, vomiting, diarrhea, bright red blood per rectum, melena, or hematemesis Neurologic:  No visual changes, wkns, changes in mental status. All other systems reviewed and are otherwise negative except as noted above.  Physical Exam    Blood pressure (!) 168/82, pulse 92, temperature 98.4 F (36.9 C), temperature source Oral, resp. rate 15, SpO2 98 %.    No intake or output data in the 24 hours ending 10/01/20 2327 Wt Readings from Last 3 Encounters:  10/01/20 70.8 kg  09/27/20 70.8 kg  08/31/20 71.2 kg    CONSTITUTIONAL: alert and conversant, well-appearing, nourished, no distress HEENT: normal NECK: no JVD CARDIAC: Regular rate and rhythm with flat JVP VASCULAR: Radial pulses intact bilaterally. No carotid bruits. PULMONARY/CHEST WALL: L subclavian approach device, site and dressing CDI with slight burising around site. EXTREMITIES: no edema, no muscle atrophy, warm and well-perfused SKIN: Dry and intact without apparent rashes or wounds. No peripheral cyanosis. NEUROLOGIC: alert, no abnormal movements, cranial nerves grossly intact. PSYCH: normal  affect, normal speech and language   Labs    No results for input(s): CKTOTAL, CKMB, TROPONINIHS, RELINDX in the last 72 hours. Lab Results  Component Value Date   WBC 16.6 (H) 10/01/2020   HGB 12.9 10/01/2020   HCT 38.3 10/01/2020   MCV 89.1 10/01/2020   PLT  434 (H) 10/01/2020    Recent Labs  Lab 10/01/20 2038  NA 135  K 3.7  CL 103  CO2 23  BUN 7  CREATININE 0.56  CALCIUM 9.2  PROT 6.0*  BILITOT 0.8  ALKPHOS 60  ALT 16  AST 16  GLUCOSE 131*   Lab Results  Component Value Date   CHOL 128 08/10/2020   HDL 37 (L) 08/10/2020   LDLCALC 65 08/10/2020   TRIG 153 (H) 08/10/2020   No results found for: DDIMER No results for input(s): BNP in the last 8760 hours. No results for input(s): PROBNP in the last 8760 hours.    Radiology Studies    DG Chest 2 View  Result Date: 10/01/2020 CLINICAL DATA:  Status post defibrillator placement EXAM: CHEST - 2 VIEW COMPARISON:  06/15/2020 FINDINGS: Defibrillator is now noted in satisfactory position. Cardiac shadow is stable. Aortic calcifications are noted. No pneumothorax is seen. The lungs are clear. Postoperative changes in the right breast are again seen. IMPRESSION: No evidence of post placement pneumothorax. No acute abnormality noted. Electronically Signed   By: Inez Catalina M.D.   On: 10/01/2020 17:59   EP PPM/ICD IMPLANT  Result Date: 10/01/2020  SURGEON:  Allegra Lai, MD    PREPROCEDURE DIAGNOSES:  1. Nonichemic cardiomyopathy.  2. New York Heart Association class III, heart failure chronically.    POSTPROCEDURE DIAGNOSES:  1. Nonischemic cardiomyopathy.  2. New York Heart Association class III heart failure chronically.    PROCEDURES:   1. ICD implantation.  2. Left upper extremity venography  3. Defibrillation threshold testing    INTRODUCTION: Erin Fields is a 54 y.o. female with a nonischemic CM (EF25%), NYHA Class III CHF, and CAD. At this time, she meets MADIT II/ SCD-HeFT criteria for ICD implantation for  primary prevention of sudden death.  The patient has a narrow QRS and does not meet criteria for revascularization.  The patient has been treated with an optimal medical regimen but continues to have a depressed ejection fraction and NYHA Class II CHF symptoms.  The patient therefore  presents today for ICD implantation.    DESCRIPTION OF PROCEDURE:  Informed written consent was obtained and the patient was brought to the electrophysiology lab in the fasting state. The patient was adequately sedated with intravenous Versed, and fentanyl as outlined in the nursing report.  The patient's left chest was prepped and draped in the usual sterile fashion by the EP lab staff.  The skin overlying the left deltopectoral region was infiltrated with lidocaine for local analgesia.  A 5-cm incision was made over the left deltopectoral region.  A left subcutaneous defibrillator pocket was fashioned using a combination of sharp and blunt dissection.  Electrocautery was used to assure hemostasis. Left Upper extremity Venography:  A venogram of the left upper extremity was performed which revealed a moderate sized left axillary vein which emptied into a moderate sized left subclavian vein.  RV Lead Placement: The left axillary vein was cannulated with fluoroscopic visualization.  No contrast was required for this endeavor.  Through the left axillary vein, a [employer] [place], model 7122Q-65 (serial number [id number]) right ventricular defibrillator lead was advanced with fluoroscopic visualization into the right ventricular apex.  Initial right ventricular lead R-wave measured 11.4 mV with impedance of 822 ohms and a threshold of 0.8 volts at 0.5 milliseconds. The leads were secured to the pectoralis  fascia using #2 silk suture over the suture sleeves.  The pocket then  irrigated  with copious gentamicin solution.  The leads were then  connected to an [place] (serial  Number [id number]) ICD.  The defibrillator was  placed into the  pocket.  The pocket was then closed in 3 layers with 2.0 Vicryl suture  for the subcutaneous and 3.0 Vicryl suture subcuticular layers.  EBL<68m. Steri-Strips and a  sterile dressing were then applied.    CONCLUSIONS:  1. Ischemic cardiomyopathy with chronic New York Heart Association class II heart failure.  2. Successful ICD implantation.  3. DFT less than or equal to 15 joules.  4. No inducible VT or VF with PES  5. No early apparent complications.   DG Chest Port 1 View  Result Date: 10/01/2020 CLINICAL DATA:  Chest pain EXAM: PORTABLE CHEST 1 VIEW COMPARISON:  10/01/2020, 06/15/2020 FINDINGS: Left-sided single lead pacing device with tip projecting over cardiac apex. Cardiac size upper limits of normal. No focal opacity, pleural effusion, or pneumothorax. IMPRESSION: Similar appearance of left-sided pacing device allowing for patient rotation. Lung fields are clear. Electronically Signed   By: KDonavan FoilM.D.   On: 10/01/2020 21:02    I personally reviewed the chest film and agree with Dr. FGwendalyn Egeassessment of unchanged CIED and lead placement.  ECG & Cardiac Imaging    NSR w/ NSST changes.  Occasional PVCs on telemetry no VT, no ATP, no therapies.  Assessment & Plan    55year old female seen in consultation at the request of Dr. HDina Richfor evaluation of her AICD placed today after presenting with episodic chest pain and report of possible ICD discharge.  I interrogated her device and see no evidence of discharge.  CXR confirms well positioned device without lead or device migration.  ECG is unremarkable and I could not identify a pericardial effusion post device placement on bedside ultrasound.  Suspect musculoskeletal pain following device placement.  #Chest pain #AICD in place #Nonischemic dilated cardiomyopathy #T2DM  #Chest pain - Suspect MSK as above - Has norco at home that she can use PRN - Recommend short course of muscle relaxer at discharge as this  may be spasm  #AICD in place - Interrogated without issue. - Routine device follow-up - Return precautions given  #HFrEF 2/2 NICM EF 25% - Continue home entersto 49/51 mg BID - Continue home carvedilol 12.5 mg BID - Continue home spironolactone 12.5 mg daily - Start SGLT2i in follow-up  #Nonobstructive moderate CAD - Continue ASA 81 mg daily, high intensity statin  Signed, CDelight Hoh MD 10/01/2020, 11:27 PM  For questions or updates, please contact   Please consult [email or web address] for contact info under Cardiology/STEMI.

## 2020-10-02 ENCOUNTER — Inpatient Hospital Stay (HOSPITAL_COMMUNITY): Payer: No Typology Code available for payment source

## 2020-10-02 ENCOUNTER — Encounter (HOSPITAL_COMMUNITY): Payer: Self-pay

## 2020-10-02 ENCOUNTER — Encounter (HOSPITAL_COMMUNITY)
Admission: EM | Disposition: A | Discharge status: Home / Self Care | Payer: Self-pay | Source: Home / Self Care | Attending: Thoracic Surgery (Cardiothoracic Vascular Surgery)

## 2020-10-02 ENCOUNTER — Emergency Department (HOSPITAL_COMMUNITY): Payer: No Typology Code available for payment source

## 2020-10-02 ENCOUNTER — Inpatient Hospital Stay (HOSPITAL_COMMUNITY): Payer: No Typology Code available for payment source | Admitting: Certified Registered Nurse Anesthetist

## 2020-10-02 ENCOUNTER — Other Ambulatory Visit: Payer: Self-pay

## 2020-10-02 ENCOUNTER — Inpatient Hospital Stay (HOSPITAL_COMMUNITY)
Admission: EM | Admit: 2020-10-02 | Discharge: 2020-10-07 | DRG: 226 | Disposition: A | Discharge status: Home / Self Care | Payer: No Typology Code available for payment source | Attending: Thoracic Surgery (Cardiothoracic Vascular Surgery) | Admitting: Thoracic Surgery (Cardiothoracic Vascular Surgery)

## 2020-10-02 DIAGNOSIS — K219 Gastro-esophageal reflux disease without esophagitis: Secondary | ICD-10-CM | POA: Diagnosis present

## 2020-10-02 DIAGNOSIS — E039 Hypothyroidism, unspecified: Secondary | ICD-10-CM | POA: Diagnosis present

## 2020-10-02 DIAGNOSIS — Z9889 Other specified postprocedural states: Secondary | ICD-10-CM

## 2020-10-02 DIAGNOSIS — I9789 Other postprocedural complications and disorders of the circulatory system, not elsewhere classified: Secondary | ICD-10-CM

## 2020-10-02 DIAGNOSIS — Z79811 Long term (current) use of aromatase inhibitors: Secondary | ICD-10-CM

## 2020-10-02 DIAGNOSIS — Z170 Estrogen receptor positive status [ER+]: Secondary | ICD-10-CM | POA: Diagnosis not present

## 2020-10-02 DIAGNOSIS — F419 Anxiety disorder, unspecified: Secondary | ICD-10-CM | POA: Diagnosis present

## 2020-10-02 DIAGNOSIS — J9811 Atelectasis: Secondary | ICD-10-CM

## 2020-10-02 DIAGNOSIS — T82198A Other mechanical complication of other cardiac electronic device, initial encounter: Secondary | ICD-10-CM | POA: Diagnosis not present

## 2020-10-02 DIAGNOSIS — Z79890 Hormone replacement therapy (postmenopausal): Secondary | ICD-10-CM

## 2020-10-02 DIAGNOSIS — Z7984 Long term (current) use of oral hypoglycemic drugs: Secondary | ICD-10-CM

## 2020-10-02 DIAGNOSIS — J942 Hemothorax: Secondary | ICD-10-CM | POA: Diagnosis present

## 2020-10-02 DIAGNOSIS — I313 Pericardial effusion (noninflammatory): Secondary | ICD-10-CM

## 2020-10-02 DIAGNOSIS — Z882 Allergy status to sulfonamides status: Secondary | ICD-10-CM

## 2020-10-02 DIAGNOSIS — Z8261 Family history of arthritis: Secondary | ICD-10-CM

## 2020-10-02 DIAGNOSIS — F32A Depression, unspecified: Secondary | ICD-10-CM | POA: Diagnosis present

## 2020-10-02 DIAGNOSIS — I5022 Chronic systolic (congestive) heart failure: Secondary | ICD-10-CM | POA: Diagnosis present

## 2020-10-02 DIAGNOSIS — I428 Other cardiomyopathies: Secondary | ICD-10-CM | POA: Diagnosis not present

## 2020-10-02 DIAGNOSIS — B001 Herpesviral vesicular dermatitis: Secondary | ICD-10-CM

## 2020-10-02 DIAGNOSIS — Z803 Family history of malignant neoplasm of breast: Secondary | ICD-10-CM | POA: Diagnosis not present

## 2020-10-02 DIAGNOSIS — Z8249 Family history of ischemic heart disease and other diseases of the circulatory system: Secondary | ICD-10-CM

## 2020-10-02 DIAGNOSIS — I110 Hypertensive heart disease with heart failure: Secondary | ICD-10-CM | POA: Diagnosis present

## 2020-10-02 DIAGNOSIS — Z886 Allergy status to analgesic agent status: Secondary | ICD-10-CM | POA: Diagnosis not present

## 2020-10-02 DIAGNOSIS — D62 Acute posthemorrhagic anemia: Secondary | ICD-10-CM | POA: Diagnosis present

## 2020-10-02 DIAGNOSIS — Z808 Family history of malignant neoplasm of other organs or systems: Secondary | ICD-10-CM

## 2020-10-02 DIAGNOSIS — I314 Cardiac tamponade: Secondary | ICD-10-CM | POA: Diagnosis present

## 2020-10-02 DIAGNOSIS — J9601 Acute respiratory failure with hypoxia: Secondary | ICD-10-CM | POA: Diagnosis present

## 2020-10-02 DIAGNOSIS — S2619XA Other injury of heart without hemopericardium, initial encounter: Secondary | ICD-10-CM | POA: Diagnosis not present

## 2020-10-02 DIAGNOSIS — Z791 Long term (current) use of non-steroidal anti-inflammatories (NSAID): Secondary | ICD-10-CM

## 2020-10-02 DIAGNOSIS — Z87891 Personal history of nicotine dependence: Secondary | ICD-10-CM

## 2020-10-02 DIAGNOSIS — E785 Hyperlipidemia, unspecified: Secondary | ICD-10-CM | POA: Diagnosis present

## 2020-10-02 DIAGNOSIS — Z7982 Long term (current) use of aspirin: Secondary | ICD-10-CM

## 2020-10-02 DIAGNOSIS — I509 Heart failure, unspecified: Secondary | ICD-10-CM

## 2020-10-02 DIAGNOSIS — Y713 Surgical instruments, materials and cardiovascular devices (including sutures) associated with adverse incidents: Secondary | ICD-10-CM | POA: Diagnosis present

## 2020-10-02 DIAGNOSIS — Y658 Other specified misadventures during surgical and medical care: Secondary | ICD-10-CM | POA: Diagnosis present

## 2020-10-02 DIAGNOSIS — I9751 Accidental puncture and laceration of a circulatory system organ or structure during a circulatory system procedure: Secondary | ICD-10-CM | POA: Diagnosis present

## 2020-10-02 DIAGNOSIS — J4530 Mild persistent asthma, uncomplicated: Secondary | ICD-10-CM | POA: Diagnosis present

## 2020-10-02 DIAGNOSIS — S2699XA Other injury of heart, unspecified with or without hemopericardium, initial encounter: Secondary | ICD-10-CM

## 2020-10-02 DIAGNOSIS — Z833 Family history of diabetes mellitus: Secondary | ICD-10-CM

## 2020-10-02 DIAGNOSIS — R571 Hypovolemic shock: Secondary | ICD-10-CM | POA: Diagnosis present

## 2020-10-02 DIAGNOSIS — I493 Ventricular premature depolarization: Secondary | ICD-10-CM | POA: Diagnosis present

## 2020-10-02 DIAGNOSIS — Z885 Allergy status to narcotic agent status: Secondary | ICD-10-CM

## 2020-10-02 DIAGNOSIS — Z923 Personal history of irradiation: Secondary | ICD-10-CM

## 2020-10-02 DIAGNOSIS — R579 Shock, unspecified: Secondary | ICD-10-CM

## 2020-10-02 DIAGNOSIS — I2510 Atherosclerotic heart disease of native coronary artery without angina pectoris: Secondary | ICD-10-CM | POA: Diagnosis present

## 2020-10-02 DIAGNOSIS — S271XXA Traumatic hemothorax, initial encounter: Secondary | ICD-10-CM | POA: Diagnosis not present

## 2020-10-02 DIAGNOSIS — R7303 Prediabetes: Secondary | ICD-10-CM | POA: Diagnosis present

## 2020-10-02 DIAGNOSIS — E119 Type 2 diabetes mellitus without complications: Secondary | ICD-10-CM | POA: Diagnosis present

## 2020-10-02 DIAGNOSIS — I420 Dilated cardiomyopathy: Secondary | ICD-10-CM | POA: Diagnosis present

## 2020-10-02 DIAGNOSIS — Z888 Allergy status to other drugs, medicaments and biological substances status: Secondary | ICD-10-CM

## 2020-10-02 DIAGNOSIS — Y831 Surgical operation with implant of artificial internal device as the cause of abnormal reaction of the patient, or of later complication, without mention of misadventure at the time of the procedure: Secondary | ICD-10-CM | POA: Diagnosis not present

## 2020-10-02 DIAGNOSIS — Z8541 Personal history of malignant neoplasm of cervix uteri: Secondary | ICD-10-CM

## 2020-10-02 DIAGNOSIS — Z801 Family history of malignant neoplasm of trachea, bronchus and lung: Secondary | ICD-10-CM

## 2020-10-02 DIAGNOSIS — Z79899 Other long term (current) drug therapy: Secondary | ICD-10-CM

## 2020-10-02 DIAGNOSIS — T82837A Hemorrhage of cardiac prosthetic devices, implants and grafts, initial encounter: Secondary | ICD-10-CM | POA: Diagnosis present

## 2020-10-02 DIAGNOSIS — Z853 Personal history of malignant neoplasm of breast: Secondary | ICD-10-CM | POA: Diagnosis not present

## 2020-10-02 DIAGNOSIS — Z806 Family history of leukemia: Secondary | ICD-10-CM

## 2020-10-02 DIAGNOSIS — I10 Essential (primary) hypertension: Secondary | ICD-10-CM | POA: Diagnosis present

## 2020-10-02 DIAGNOSIS — Z83438 Family history of other disorder of lipoprotein metabolism and other lipidemia: Secondary | ICD-10-CM

## 2020-10-02 DIAGNOSIS — Z7951 Long term (current) use of inhaled steroids: Secondary | ICD-10-CM

## 2020-10-02 DIAGNOSIS — Z881 Allergy status to other antibiotic agents status: Secondary | ICD-10-CM

## 2020-10-02 HISTORY — DX: Hemothorax: J94.2

## 2020-10-02 HISTORY — PX: MEDIASTERNOTOMY: SHX5084

## 2020-10-02 HISTORY — DX: Other injury of heart, unspecified with or without hemopericardium, initial encounter: S26.99XA

## 2020-10-02 HISTORY — PX: ICD LEAD REMOVAL: SHX5855

## 2020-10-02 HISTORY — PX: GENERATOR REMOVAL: SHX5468

## 2020-10-02 HISTORY — DX: Other postprocedural complications and disorders of the circulatory system, not elsewhere classified: I97.89

## 2020-10-02 HISTORY — PX: TEE WITHOUT CARDIOVERSION: SHX5443

## 2020-10-02 LAB — POCT I-STAT 7, (LYTES, BLD GAS, ICA,H+H)
Acid-base deficit: 8 mmol/L — ABNORMAL HIGH (ref 0.0–2.0)
Acid-base deficit: 4 mmol/L — ABNORMAL HIGH (ref 0.0–2.0)
Acid-base deficit: 1 mmol/L (ref 0.0–2.0)
Acid-base deficit: 3 mmol/L — ABNORMAL HIGH (ref 0.0–2.0)
Bicarbonate: 20.4 mmol/L (ref 20.0–28.0)
Bicarbonate: 23.6 mmol/L (ref 20.0–28.0)
Bicarbonate: 25.1 mmol/L (ref 20.0–28.0)
Bicarbonate: 24.5 mmol/L (ref 20.0–28.0)
Calcium, Ion: 0.87 mmol/L — CL (ref 1.15–1.40)
Calcium, Ion: 1 mmol/L — ABNORMAL LOW (ref 1.15–1.40)
Calcium, Ion: 1.04 mmol/L — ABNORMAL LOW (ref 1.15–1.40)
Calcium, Ion: 1.11 mmol/L — ABNORMAL LOW (ref 1.15–1.40)
HCT: 33 % — ABNORMAL LOW (ref 36.0–46.0)
HCT: 44 % (ref 36.0–46.0)
HCT: 39 % (ref 36.0–46.0)
HCT: 41 % (ref 36.0–46.0)
Hemoglobin: 11.2 g/dL — ABNORMAL LOW (ref 12.0–15.0)
Hemoglobin: 15 g/dL (ref 12.0–15.0)
Hemoglobin: 13.3 g/dL (ref 12.0–15.0)
Hemoglobin: 13.9 g/dL (ref 12.0–15.0)
O2 Saturation: 100 %
O2 Saturation: 98 %
O2 Saturation: 99 %
O2 Saturation: 99 %
Patient temperature: 34.9
Patient temperature: 37.7
Patient temperature: 36.23
Potassium: 5.4 mmol/L — ABNORMAL HIGH (ref 3.5–5.1)
Potassium: 3.6 mmol/L (ref 3.5–5.1)
Potassium: 4.4 mmol/L (ref 3.5–5.1)
Potassium: 4.1 mmol/L (ref 3.5–5.1)
Sodium: 137 mmol/L (ref 135–145)
Sodium: 141 mmol/L (ref 135–145)
Sodium: 140 mmol/L (ref 135–145)
Sodium: 139 mmol/L (ref 135–145)
TCO2: 22 mmol/L (ref 22–32)
TCO2: 25 mmol/L (ref 22–32)
TCO2: 27 mmol/L (ref 22–32)
TCO2: 26 mmol/L (ref 22–32)
pCO2 arterial: 53.9 mmHg — ABNORMAL HIGH (ref 32.0–48.0)
pCO2 arterial: 45.6 mmHg (ref 32.0–48.0)
pCO2 arterial: 49.3 mmHg — ABNORMAL HIGH (ref 32.0–48.0)
pCO2 arterial: 51 mmHg — ABNORMAL HIGH (ref 32.0–48.0)
pH, Arterial: 7.185 — CL (ref 7.350–7.450)
pH, Arterial: 7.311 — ABNORMAL LOW (ref 7.350–7.450)
pH, Arterial: 7.318 — ABNORMAL LOW (ref 7.350–7.450)
pH, Arterial: 7.285 — ABNORMAL LOW (ref 7.350–7.450)
pO2, Arterial: 255 mmHg — ABNORMAL HIGH (ref 83.0–108.0)
pO2, Arterial: 102 mmHg (ref 83.0–108.0)
pO2, Arterial: 134 mmHg — ABNORMAL HIGH (ref 83.0–108.0)
pO2, Arterial: 138 mmHg — ABNORMAL HIGH (ref 83.0–108.0)

## 2020-10-02 LAB — CBC
HCT: 45.5 % (ref 36.0–46.0)
HCT: 42.7 % (ref 36.0–46.0)
Hemoglobin: 15.7 g/dL — ABNORMAL HIGH (ref 12.0–15.0)
Hemoglobin: 14.7 g/dL (ref 12.0–15.0)
MCH: 31 pg (ref 26.0–34.0)
MCH: 30.9 pg (ref 26.0–34.0)
MCHC: 34.5 g/dL (ref 30.0–36.0)
MCHC: 34.4 g/dL (ref 30.0–36.0)
MCV: 89.7 fL (ref 80.0–100.0)
MCV: 89.9 fL (ref 80.0–100.0)
Platelets: 100 10*3/uL — ABNORMAL LOW (ref 150–400)
Platelets: 128 10*3/uL — ABNORMAL LOW (ref 150–400)
RBC: 5.07 MIL/uL (ref 3.87–5.11)
RBC: 4.75 MIL/uL (ref 3.87–5.11)
RDW: 12.8 % (ref 11.5–15.5)
RDW: 13.1 % (ref 11.5–15.5)
WBC: 16.8 10*3/uL — ABNORMAL HIGH (ref 4.0–10.5)
WBC: 15.3 10*3/uL — ABNORMAL HIGH (ref 4.0–10.5)
nRBC: 0 % (ref 0.0–0.2)
nRBC: 0 % (ref 0.0–0.2)

## 2020-10-02 LAB — COMPREHENSIVE METABOLIC PANEL
ALT: 14 U/L (ref 0–44)
AST: 15 U/L (ref 15–41)
Albumin: 4 g/dL (ref 3.5–5.0)
Alkaline Phosphatase: 58 U/L (ref 38–126)
Anion gap: 12 (ref 5–15)
BUN: 5 mg/dL — ABNORMAL LOW (ref 6–20)
CO2: 23 mmol/L (ref 22–32)
Calcium: 9.4 mg/dL (ref 8.9–10.3)
Chloride: 95 mmol/L — ABNORMAL LOW (ref 98–111)
Creatinine, Ser: 0.81 mg/dL (ref 0.44–1.00)
GFR, Estimated: >60 mL/min (ref >60)
Glucose, Bld: 328 mg/dL — ABNORMAL HIGH (ref 70–99)
Potassium: 4 mmol/L (ref 3.5–5.1)
Sodium: 130 mmol/L — ABNORMAL LOW (ref 135–145)
Total Bilirubin: 0.9 mg/dL (ref 0.3–1.2)
Total Protein: 6.5 g/dL (ref 6.5–8.1)

## 2020-10-02 LAB — CBC WITH DIFFERENTIAL/PLATELET
Abs Immature Granulocytes: 0.2 10*3/uL — ABNORMAL HIGH (ref 0.00–0.07)
Basophils Absolute: 0.1 10*3/uL (ref 0.0–0.1)
Basophils Relative: 0 %
Eosinophils Absolute: 0.3 10*3/uL (ref 0.0–0.5)
Eosinophils Relative: 1 %
HCT: 35.1 % — ABNORMAL LOW (ref 36.0–46.0)
Hemoglobin: 11.6 g/dL — ABNORMAL LOW (ref 12.0–15.0)
Immature Granulocytes: 1 %
Lymphocytes Relative: 25 %
Lymphs Abs: 6.4 10*3/uL — ABNORMAL HIGH (ref 0.7–4.0)
MCH: 30.4 pg (ref 26.0–34.0)
MCHC: 33 g/dL (ref 30.0–36.0)
MCV: 91.9 fL (ref 80.0–100.0)
Monocytes Absolute: 2.6 10*3/uL — ABNORMAL HIGH (ref 0.1–1.0)
Monocytes Relative: 10 %
Neutro Abs: 16.1 10*3/uL — ABNORMAL HIGH (ref 1.7–7.7)
Neutrophils Relative %: 63 %
Platelets: 498 10*3/uL — ABNORMAL HIGH (ref 150–400)
RBC: 3.82 MIL/uL — ABNORMAL LOW (ref 3.87–5.11)
RDW: 12.3 % (ref 11.5–15.5)
WBC: 25.8 10*3/uL — ABNORMAL HIGH (ref 4.0–10.5)
nRBC: 0 % (ref 0.0–0.2)

## 2020-10-02 LAB — TROPONIN I (HIGH SENSITIVITY): Troponin I (High Sensitivity): 121 ng/L (ref <18)

## 2020-10-02 LAB — GLUCOSE, CAPILLARY
Glucose-Capillary: 124 mg/dL — ABNORMAL HIGH (ref 70–99)
Glucose-Capillary: 125 mg/dL — ABNORMAL HIGH (ref 70–99)
Glucose-Capillary: 132 mg/dL — ABNORMAL HIGH (ref 70–99)
Glucose-Capillary: 140 mg/dL — ABNORMAL HIGH (ref 70–99)
Glucose-Capillary: 147 mg/dL — ABNORMAL HIGH (ref 70–99)
Glucose-Capillary: 158 mg/dL — ABNORMAL HIGH (ref 70–99)
Glucose-Capillary: 170 mg/dL — ABNORMAL HIGH (ref 70–99)

## 2020-10-02 LAB — POCT I-STAT, CHEM 8
BUN: 3 mg/dL — ABNORMAL LOW (ref 6–20)
Calcium, Ion: 0.85 mmol/L — CL (ref 1.15–1.40)
Chloride: 102 mmol/L (ref 98–111)
Creatinine, Ser: 0.4 mg/dL — ABNORMAL LOW (ref 0.44–1.00)
Glucose, Bld: 286 mg/dL — ABNORMAL HIGH (ref 70–99)
HCT: 33 % — ABNORMAL LOW (ref 36.0–46.0)
Hemoglobin: 11.2 g/dL — ABNORMAL LOW (ref 12.0–15.0)
Potassium: 5.4 mmol/L — ABNORMAL HIGH (ref 3.5–5.1)
Sodium: 136 mmol/L (ref 135–145)
TCO2: 21 mmol/L — ABNORMAL LOW (ref 22–32)

## 2020-10-02 LAB — BASIC METABOLIC PANEL
Anion gap: 4 — ABNORMAL LOW (ref 5–15)
BUN: <5 mg/dL — ABNORMAL LOW (ref 6–20)
CO2: 25 mmol/L (ref 22–32)
Calcium: 7 mg/dL — ABNORMAL LOW (ref 8.9–10.3)
Chloride: 107 mmol/L (ref 98–111)
Creatinine, Ser: 0.46 mg/dL (ref 0.44–1.00)
GFR, Estimated: >60 mL/min (ref >60)
Glucose, Bld: 136 mg/dL — ABNORMAL HIGH (ref 70–99)
Potassium: 3.9 mmol/L (ref 3.5–5.1)
Sodium: 136 mmol/L (ref 135–145)

## 2020-10-02 LAB — I-STAT CHEM 8, ED
BUN: 3 mg/dL — ABNORMAL LOW (ref 6–20)
Calcium, Ion: 1.25 mmol/L (ref 1.15–1.40)
Chloride: 95 mmol/L — ABNORMAL LOW (ref 98–111)
Creatinine, Ser: 0.6 mg/dL (ref 0.44–1.00)
Glucose, Bld: 319 mg/dL — ABNORMAL HIGH (ref 70–99)
HCT: 35 % — ABNORMAL LOW (ref 36.0–46.0)
Hemoglobin: 11.9 g/dL — ABNORMAL LOW (ref 12.0–15.0)
Potassium: 4 mmol/L (ref 3.5–5.1)
Sodium: 129 mmol/L — ABNORMAL LOW (ref 135–145)
TCO2: 22 mmol/L (ref 22–32)

## 2020-10-02 LAB — ECHO INTRAOPERATIVE TEE

## 2020-10-02 LAB — APTT: aPTT: 27 seconds (ref 24–36)

## 2020-10-02 LAB — CBG MONITORING, ED: Glucose-Capillary: 348 mg/dL — ABNORMAL HIGH (ref 70–99)

## 2020-10-02 LAB — MAGNESIUM: Magnesium: 2.7 mg/dL — ABNORMAL HIGH (ref 1.7–2.4)

## 2020-10-02 LAB — ECHOCARDIOGRAM LIMITED

## 2020-10-02 LAB — TYPE AND SCREEN
ABO/RH(D): O POS
Antibody Screen: NEGATIVE

## 2020-10-02 LAB — PROTIME-INR
INR: 1.4 — ABNORMAL HIGH (ref 0.8–1.2)
Prothrombin Time: 17.3 seconds — ABNORMAL HIGH (ref 11.4–15.2)

## 2020-10-02 LAB — PREPARE RBC (CROSSMATCH)

## 2020-10-02 SURGERY — MEDIAN STERNOTOMY
Anesthesia: General

## 2020-10-02 MED ORDER — TRANEXAMIC ACID (OHS) BOLUS VIA INFUSION
15.0000 mg/kg | INTRAVENOUS | Status: DC
  Filled 2020-10-02: qty 1062

## 2020-10-02 MED ORDER — SODIUM CHLORIDE 0.9 % IV SOLN: 250.0000 mL | INTRAVENOUS | Status: DC

## 2020-10-02 MED ORDER — SODIUM CHLORIDE 0.9% FLUSH
3.0000 mL | Freq: Two times a day (BID) | INTRAVENOUS | Status: DC
  Administered 2020-10-03 – 2020-10-05 (×5): 3 mL via INTRAVENOUS

## 2020-10-02 MED ORDER — VANCOMYCIN HCL IN DEXTROSE 1-5 GM/200ML-% IV SOLN
1000.0000 mg | Freq: Once | INTRAVENOUS | Status: AC
  Administered 2020-10-02: 1000 mg via INTRAVENOUS
  Filled 2020-10-02: qty 200

## 2020-10-02 MED ORDER — VENLAFAXINE HCL ER 75 MG PO CP24
150.0000 mg | ORAL_CAPSULE | Freq: Every day | ORAL | Status: DC
  Administered 2020-10-03 – 2020-10-07 (×5): 150 mg via ORAL
  Filled 2020-10-02: qty 2
  Filled 2020-10-02: qty 1
  Filled 2020-10-02 (×2): qty 2
  Filled 2020-10-02: qty 1

## 2020-10-02 MED ORDER — PHENYLEPHRINE HCL-NACL 10-0.9 MG/250ML-% IV SOLN
INTRAVENOUS | Status: DC | PRN
  Administered 2020-10-02: 50 ug/min via INTRAVENOUS

## 2020-10-02 MED ORDER — BISACODYL 5 MG PO TBEC
10.0000 mg | DELAYED_RELEASE_TABLET | Freq: Every day | ORAL | Status: DC
  Administered 2020-10-03 – 2020-10-07 (×3): 10 mg via ORAL
  Filled 2020-10-02 (×3): qty 2

## 2020-10-02 MED ORDER — NITROGLYCERIN IN D5W 200-5 MCG/ML-% IV SOLN
2.0000 ug/min | INTRAVENOUS | Status: DC
  Filled 2020-10-02: qty 250

## 2020-10-02 MED ORDER — ASPIRIN EC 325 MG PO TBEC
325.0000 mg | DELAYED_RELEASE_TABLET | Freq: Every day | ORAL | Status: DC
  Administered 2020-10-03: 325 mg via ORAL
  Filled 2020-10-02: qty 1

## 2020-10-02 MED ORDER — CHLORHEXIDINE GLUCONATE CLOTH 2 % EX PADS
6.0000 | MEDICATED_PAD | Freq: Every day | CUTANEOUS | Status: DC
  Administered 2020-10-02 – 2020-10-06 (×4): 6 via TOPICAL

## 2020-10-02 MED ORDER — FAMOTIDINE IN NACL 20-0.9 MG/50ML-% IV SOLN
20.0000 mg | Freq: Two times a day (BID) | INTRAVENOUS | Status: DC
  Administered 2020-10-02: 20 mg via INTRAVENOUS
  Filled 2020-10-02 (×2): qty 50

## 2020-10-02 MED ORDER — FENTANYL CITRATE (PF) 100 MCG/2ML IJ SOLN: 50.0000 ug | INTRAMUSCULAR | Status: DC | PRN

## 2020-10-02 MED ORDER — FENTANYL CITRATE (PF) 100 MCG/2ML IJ SOLN: 25.0000 ug | INTRAMUSCULAR | Status: DC | PRN

## 2020-10-02 MED ORDER — VANCOMYCIN HCL 1250 MG/250ML IV SOLN
1250.0000 mg | INTRAVENOUS | Status: DC
  Filled 2020-10-02 (×2): qty 250

## 2020-10-02 MED ORDER — BISACODYL 10 MG RE SUPP
10.0000 mg | Freq: Every day | RECTAL | Status: DC
  Filled 2020-10-02: qty 1

## 2020-10-02 MED ORDER — NOREPINEPHRINE 4 MG/250ML-% IV SOLN
0.0000 ug/min | INTRAVENOUS | Status: DC
Start: 2020-10-03 — End: 2020-10-02

## 2020-10-02 MED ORDER — TRANEXAMIC ACID 1000 MG/10ML IV SOLN: 1.5000 mg/kg/h | INTRAVENOUS | Status: DC

## 2020-10-02 MED ORDER — CEFAZOLIN SODIUM-DEXTROSE 2-3 GM-%(50ML) IV SOLR
INTRAVENOUS | Status: DC | PRN
  Administered 2020-10-02: 2 g via INTRAVENOUS

## 2020-10-02 MED ORDER — LEVOFLOXACIN IN D5W 500 MG/100ML IV SOLN
500.0000 mg | INTRAVENOUS | Status: DC
  Filled 2020-10-02 (×2): qty 100

## 2020-10-02 MED ORDER — SODIUM CHLORIDE 0.9 % IV SOLN: INTRAVENOUS | Status: DC | PRN

## 2020-10-02 MED ORDER — VANCOMYCIN HCL 1000 MG IV SOLR: INTRAVENOUS | Status: DC

## 2020-10-02 MED ORDER — PANTOPRAZOLE SODIUM 40 MG PO PACK: 20.0000 mg | PACK | Freq: Every day | ORAL | Status: DC

## 2020-10-02 MED ORDER — STERILE WATER FOR INJECTION IV SOLN
INTRAVENOUS | Status: DC
  Filled 2020-10-02: qty 1000

## 2020-10-02 MED ORDER — MANNITOL 20 % IV SOLN
INTRAVENOUS | Status: DC
  Filled 2020-10-02: qty 13

## 2020-10-02 MED ORDER — SODIUM CHLORIDE (PF) 0.9 % IJ SOLN
OROMUCOSAL | Status: DC | PRN
  Administered 2020-10-02: 24 mL via TOPICAL

## 2020-10-02 MED ORDER — VASOPRESSIN 20 UNIT/ML IV SOLN
INTRAVENOUS | Status: DC | PRN
  Administered 2020-10-02 (×4): 1 [IU] via INTRAVENOUS

## 2020-10-02 MED ORDER — LACTATED RINGERS IV SOLN: INTRAVENOUS | Status: DC

## 2020-10-02 MED ORDER — PANTOPRAZOLE SODIUM 40 MG IV SOLR: 40.0000 mg | Freq: Every day | INTRAVENOUS | Status: DC

## 2020-10-02 MED ORDER — INSULIN REGULAR(HUMAN) IN NACL 100-0.9 UT/100ML-% IV SOLN: INTRAVENOUS | Status: DC

## 2020-10-02 MED ORDER — HYOSCYAMINE SULFATE ER 0.375 MG PO TB12
0.3750 mg | ORAL_TABLET | Freq: Two times a day (BID) | ORAL | Status: DC
  Administered 2020-10-03 – 2020-10-07 (×9): 0.375 mg via ORAL
  Filled 2020-10-02 (×10): qty 1

## 2020-10-02 MED ORDER — VASOPRESSIN 20 UNIT/ML IV SOLN
INTRAVENOUS | Status: AC
  Filled 2020-10-02: qty 1

## 2020-10-02 MED ORDER — INSULIN ASPART 100 UNIT/ML IJ SOLN: 0.0000 [IU] | INTRAMUSCULAR | Status: DC

## 2020-10-02 MED ORDER — DEXTROSE 50 % IV SOLN: 0.0000 mL | INTRAVENOUS | Status: DC | PRN

## 2020-10-02 MED ORDER — NITROGLYCERIN IN D5W 200-5 MCG/ML-% IV SOLN: 2.0000 ug/min | INTRAVENOUS | Status: DC

## 2020-10-02 MED ORDER — POLYETHYLENE GLYCOL 3350 17 G PO PACK
17.0000 g | PACK | Freq: Every day | ORAL | Status: DC
  Filled 2020-10-02: qty 1

## 2020-10-02 MED ORDER — ONDANSETRON HCL 4 MG/2ML IJ SOLN
4.0000 mg | Freq: Four times a day (QID) | INTRAMUSCULAR | Status: DC | PRN
  Administered 2020-10-02: 4 mg via INTRAVENOUS
  Filled 2020-10-02: qty 2

## 2020-10-02 MED ORDER — PLASMA-LYTE A IV SOLN: INTRAVENOUS | Status: DC

## 2020-10-02 MED ORDER — FENTANYL CITRATE (PF) 250 MCG/5ML IJ SOLN
INTRAMUSCULAR | Status: AC
  Filled 2020-10-02: qty 5

## 2020-10-02 MED ORDER — ACETAMINOPHEN 160 MG/5ML PO SOLN: 1000.0000 mg | Freq: Four times a day (QID) | ORAL | Status: DC

## 2020-10-02 MED ORDER — DEXMEDETOMIDINE HCL IN NACL 400 MCG/100ML IV SOLN
0.1000 ug/kg/h | INTRAVENOUS | Status: DC
Start: 2020-10-03 — End: 2020-10-02

## 2020-10-02 MED ORDER — ROCURONIUM BROMIDE 10 MG/ML (PF) SYRINGE
PREFILLED_SYRINGE | INTRAVENOUS | Status: DC | PRN
  Administered 2020-10-02: 70 mg via INTRAVENOUS
  Administered 2020-10-02: 30 mg via INTRAVENOUS

## 2020-10-02 MED ORDER — TRANEXAMIC ACID 1000 MG/10ML IV SOLN
1.5000 mg/kg/h | INTRAVENOUS | Status: DC
  Filled 2020-10-02: qty 25

## 2020-10-02 MED ORDER — EPINEPHRINE HCL 5 MG/250ML IV SOLN IN NS
0.0000 ug/min | INTRAVENOUS | Status: DC
Start: 2020-10-03 — End: 2020-10-02

## 2020-10-02 MED ORDER — LACTATED RINGERS IV SOLN: 500.0000 mL | Freq: Once | INTRAVENOUS | Status: DC | PRN

## 2020-10-02 MED ORDER — NITROGLYCERIN IN D5W 200-5 MCG/ML-% IV SOLN
0.0000 ug/min | INTRAVENOUS | Status: DC
  Administered 2020-10-02: 0 ug/min via INTRAVENOUS
  Filled 2020-10-02: qty 250

## 2020-10-02 MED ORDER — FENTANYL CITRATE (PF) 250 MCG/5ML IJ SOLN
INTRAMUSCULAR | Status: DC | PRN
  Administered 2020-10-02 (×5): 50 ug via INTRAVENOUS

## 2020-10-02 MED ORDER — MIDAZOLAM HCL 2 MG/2ML IJ SOLN
INTRAMUSCULAR | Status: AC
  Filled 2020-10-02: qty 2

## 2020-10-02 MED ORDER — SODIUM BICARBONATE 8.4 % IV SOLN
INTRAVENOUS | Status: DC | PRN
  Administered 2020-10-02 (×4): 50 meq via INTRAVENOUS

## 2020-10-02 MED ORDER — DOCUSATE SODIUM 100 MG PO CAPS: 200.0000 mg | ORAL_CAPSULE | Freq: Every day | ORAL | Status: DC

## 2020-10-02 MED ORDER — SODIUM CHLORIDE 0.9% FLUSH: 3.0000 mL | INTRAVENOUS | Status: DC | PRN

## 2020-10-02 MED ORDER — SODIUM CHLORIDE 0.9% FLUSH
10.0000 mL | Freq: Two times a day (BID) | INTRAVENOUS | Status: DC
  Administered 2020-10-02 – 2020-10-06 (×6): 10 mL

## 2020-10-02 MED ORDER — VANCOMYCIN HCL 1000 MG IV SOLR
INTRAVENOUS | Status: DC
  Filled 2020-10-02: qty 1000

## 2020-10-02 MED ORDER — DOCUSATE SODIUM 100 MG PO CAPS: 100.0000 mg | ORAL_CAPSULE | Freq: Two times a day (BID) | ORAL | Status: DC | PRN

## 2020-10-02 MED ORDER — DOCUSATE SODIUM 50 MG/5ML PO LIQD
100.0000 mg | Freq: Two times a day (BID) | ORAL | Status: DC
  Filled 2020-10-02: qty 10

## 2020-10-02 MED ORDER — MONTELUKAST SODIUM 10 MG PO TABS
10.0000 mg | ORAL_TABLET | Freq: Every day | ORAL | Status: DC
  Administered 2020-10-03 – 2020-10-06 (×4): 10 mg via ORAL
  Filled 2020-10-02 (×4): qty 1

## 2020-10-02 MED ORDER — NOREPINEPHRINE 4 MG/250ML-% IV SOLN
0.0000 ug/min | INTRAVENOUS | Status: DC
  Administered 2020-10-03: 7 ug/min via INTRAVENOUS
  Filled 2020-10-02: qty 250

## 2020-10-02 MED ORDER — MILRINONE LACTATE IN DEXTROSE 20-5 MG/100ML-% IV SOLN: 0.3000 ug/kg/min | INTRAVENOUS | Status: DC

## 2020-10-02 MED ORDER — EPINEPHRINE HCL 5 MG/250ML IV SOLN IN NS
0.0000 ug/min | INTRAVENOUS | Status: DC
Start: 2020-10-02 — End: 2020-10-02
  Filled 2020-10-02: qty 250

## 2020-10-02 MED ORDER — ACETAMINOPHEN 160 MG/5ML PO SOLN: 650.0000 mg | Freq: Once | ORAL | Status: AC

## 2020-10-02 MED ORDER — SODIUM CHLORIDE 0.45 % IV SOLN: INTRAVENOUS | Status: DC | PRN

## 2020-10-02 MED ORDER — LEVOTHYROXINE SODIUM 75 MCG PO TABS
75.0000 ug | ORAL_TABLET | Freq: Every day | ORAL | Status: DC
  Administered 2020-10-03 – 2020-10-07 (×5): 75 ug via ORAL
  Filled 2020-10-02 (×5): qty 1

## 2020-10-02 MED ORDER — POLYETHYLENE GLYCOL 3350 17 G PO PACK: 17.0000 g | PACK | Freq: Every day | ORAL | Status: DC | PRN

## 2020-10-02 MED ORDER — PHENYLEPHRINE HCL-NACL 20-0.9 MG/250ML-% IV SOLN: 0.0000 ug/min | INTRAVENOUS | Status: DC

## 2020-10-02 MED ORDER — SODIUM BICARBONATE 8.4 % IV SOLN
INTRAVENOUS | Status: AC
  Filled 2020-10-02: qty 50

## 2020-10-02 MED ORDER — PHENYLEPHRINE HCL-NACL 20-0.9 MG/250ML-% IV SOLN: 30.0000 ug/min | INTRAVENOUS | Status: DC

## 2020-10-02 MED ORDER — TRANEXAMIC ACID (OHS) PUMP PRIME SOLUTION: 2.0000 mg/kg | INTRAVENOUS | Status: DC

## 2020-10-02 MED ORDER — NOREPINEPHRINE 4 MG/250ML-% IV SOLN
0.0000 ug/min | INTRAVENOUS | Status: DC
  Filled 2020-10-02: qty 250

## 2020-10-02 MED ORDER — MIDAZOLAM HCL (PF) 5 MG/ML IJ SOLN
INTRAMUSCULAR | Status: DC | PRN
  Administered 2020-10-02: 2 mg via INTRAVENOUS

## 2020-10-02 MED ORDER — LIDOCAINE 2% (20 MG/ML) 5 ML SYRINGE
INTRAMUSCULAR | Status: DC | PRN
  Administered 2020-10-02: 40 mg via INTRAVENOUS

## 2020-10-02 MED ORDER — SODIUM CHLORIDE 0.9 % IV SOLN
INTRAVENOUS | Status: DC
  Filled 2020-10-02: qty 30

## 2020-10-02 MED ORDER — MOMETASONE FURO-FORMOTEROL FUM 200-5 MCG/ACT IN AERO
2.0000 | INHALATION_SPRAY | Freq: Two times a day (BID) | RESPIRATORY_TRACT | Status: DC
  Administered 2020-10-03 – 2020-10-07 (×9): 2 via RESPIRATORY_TRACT
  Filled 2020-10-02: qty 8.8

## 2020-10-02 MED ORDER — CHLORHEXIDINE GLUCONATE 0.12% ORAL RINSE (MEDLINE KIT)
15.0000 mL | Freq: Two times a day (BID) | OROMUCOSAL | Status: DC
  Administered 2020-10-02: 15 mL via OROMUCOSAL

## 2020-10-02 MED ORDER — ETOMIDATE 2 MG/ML IV SOLN
INTRAVENOUS | Status: AC
  Filled 2020-10-02: qty 10

## 2020-10-02 MED ORDER — ROSUVASTATIN CALCIUM 20 MG PO TABS
20.0000 mg | ORAL_TABLET | Freq: Every day | ORAL | Status: DC
  Administered 2020-10-03 – 2020-10-06 (×4): 20 mg via ORAL
  Filled 2020-10-02 (×4): qty 1

## 2020-10-02 MED ORDER — SODIUM CHLORIDE 0.9% IV SOLUTION: Freq: Once | INTRAVENOUS | Status: DC

## 2020-10-02 MED ORDER — LACTATED RINGERS IV SOLN: INTRAVENOUS | Status: DC | PRN

## 2020-10-02 MED ORDER — ASPIRIN 81 MG PO CHEW: 324.0000 mg | CHEWABLE_TABLET | Freq: Every day | ORAL | Status: DC

## 2020-10-02 MED ORDER — PLASMA-LYTE A IV SOLN
INTRAVENOUS | Status: DC
  Filled 2020-10-02: qty 5

## 2020-10-02 MED ORDER — POTASSIUM CHLORIDE 2 MEQ/ML IV SOLN: 80.0000 meq | INTRAVENOUS | Status: DC

## 2020-10-02 MED ORDER — POTASSIUM CHLORIDE 10 MEQ/50ML IV SOLN
10.0000 meq | INTRAVENOUS | Status: AC
  Administered 2020-10-02 (×3): 10 meq via INTRAVENOUS

## 2020-10-02 MED ORDER — PANTOPRAZOLE SODIUM 40 MG IV SOLR
40.0000 mg | Freq: Every day | INTRAVENOUS | Status: DC
  Administered 2020-10-02 – 2020-10-05 (×4): 40 mg via INTRAVENOUS
  Filled 2020-10-02 (×4): qty 40

## 2020-10-02 MED ORDER — SODIUM CHLORIDE 0.9 % IV SOLN: INTRAVENOUS | Status: DC

## 2020-10-02 MED ORDER — ORAL CARE MOUTH RINSE
15.0000 mL | OROMUCOSAL | Status: DC
  Administered 2020-10-02: 15 mL via OROMUCOSAL

## 2020-10-02 MED ORDER — FENTANYL CITRATE (PF) 100 MCG/2ML IJ SOLN
100.0000 ug | INTRAMUSCULAR | Status: DC | PRN
  Administered 2020-10-02 – 2020-10-06 (×23): 100 ug via INTRAVENOUS
  Filled 2020-10-02 (×23): qty 2

## 2020-10-02 MED ORDER — ACETAMINOPHEN 650 MG RE SUPP
650.0000 mg | Freq: Once | RECTAL | Status: AC
  Administered 2020-10-02: 650 mg via RECTAL

## 2020-10-02 MED ORDER — LEVOFLOXACIN IN D5W 750 MG/150ML IV SOLN
750.0000 mg | INTRAVENOUS | Status: AC
  Administered 2020-10-03: 750 mg via INTRAVENOUS
  Filled 2020-10-02: qty 150

## 2020-10-02 MED ORDER — ACETAMINOPHEN 500 MG PO TABS
1000.0000 mg | ORAL_TABLET | Freq: Four times a day (QID) | ORAL | Status: DC
  Administered 2020-10-02 – 2020-10-07 (×18): 1000 mg via ORAL
  Filled 2020-10-02 (×18): qty 2

## 2020-10-02 MED ORDER — PROPOFOL 10 MG/ML IV BOLUS
INTRAVENOUS | Status: AC
  Filled 2020-10-02: qty 20

## 2020-10-02 MED ORDER — CHLORHEXIDINE GLUCONATE 0.12 % MT SOLN
15.0000 mL | OROMUCOSAL | Status: AC
  Administered 2020-10-02: 15 mL via OROMUCOSAL

## 2020-10-02 MED ORDER — DEXMEDETOMIDINE HCL IN NACL 400 MCG/100ML IV SOLN
0.0000 ug/kg/h | INTRAVENOUS | Status: DC
  Administered 2020-10-02: 0.7 ug/kg/h via INTRAVENOUS
  Filled 2020-10-02: qty 100

## 2020-10-02 MED ORDER — TRANEXAMIC ACID (OHS) PUMP PRIME SOLUTION
2.0000 mg/kg | INTRAVENOUS | Status: DC
  Filled 2020-10-02: qty 1.42

## 2020-10-02 MED ORDER — MIDAZOLAM HCL 2 MG/2ML IJ SOLN: 2.0000 mg | INTRAMUSCULAR | Status: DC | PRN

## 2020-10-02 MED ORDER — POTASSIUM CHLORIDE 2 MEQ/ML IV SOLN
80.0000 meq | INTRAVENOUS | Status: DC
  Filled 2020-10-02: qty 40

## 2020-10-02 MED ORDER — PHENYLEPHRINE HCL-NACL 20-0.9 MG/250ML-% IV SOLN
30.0000 ug/min | INTRAVENOUS | Status: DC
Start: 2020-10-02 — End: 2020-10-02
  Filled 2020-10-02: qty 250

## 2020-10-02 MED ORDER — TRANEXAMIC ACID (OHS) BOLUS VIA INFUSION: 15.0000 mg/kg | INTRAVENOUS | Status: DC

## 2020-10-02 MED ORDER — SODIUM BICARBONATE 8.4 % IV SOLN: 50.0000 meq | Freq: Once | INTRAVENOUS | Status: DC

## 2020-10-02 MED ORDER — SODIUM CHLORIDE 0.9% FLUSH: 10.0000 mL | INTRAVENOUS | Status: DC | PRN

## 2020-10-02 MED ORDER — PHENYLEPHRINE HCL-NACL 10-0.9 MG/250ML-% IV SOLN: 0.0000 ug/min | INTRAVENOUS | Status: DC

## 2020-10-02 MED ORDER — ETOMIDATE 2 MG/ML IV SOLN
INTRAVENOUS | Status: DC | PRN
  Administered 2020-10-02: 12 mg via INTRAVENOUS

## 2020-10-02 MED ORDER — METOPROLOL TARTRATE 5 MG/5ML IV SOLN
2.5000 mg | INTRAVENOUS | Status: DC | PRN
Start: 2020-10-02 — End: 2020-10-07

## 2020-10-02 MED ORDER — ALBUMIN HUMAN 5 % IV SOLN: INTRAVENOUS | Status: DC | PRN

## 2020-10-02 MED ORDER — NOREPINEPHRINE 4 MG/250ML-% IV SOLN
2.0000 ug/min | INTRAVENOUS | Status: DC
  Administered 2020-10-02: 1 ug/min via INTRAVENOUS
  Filled 2020-10-02: qty 250

## 2020-10-02 MED ORDER — ALBUMIN HUMAN 5 % IV SOLN
250.0000 mL | INTRAVENOUS | Status: DC | PRN
Start: 2020-10-02 — End: 2020-10-03
  Administered 2020-10-02 (×2): 12.5 g via INTRAVENOUS

## 2020-10-02 MED ORDER — MANNITOL 20 % IV SOLN
Freq: Once | INTRAVENOUS | Status: DC
  Filled 2020-10-02: qty 13

## 2020-10-02 MED ORDER — SUCCINYLCHOLINE CHLORIDE 200 MG/10ML IV SOSY
PREFILLED_SYRINGE | INTRAVENOUS | Status: DC | PRN
  Administered 2020-10-02: 200 mg via INTRAVENOUS

## 2020-10-02 MED ORDER — 0.9 % SODIUM CHLORIDE (POUR BTL) OPTIME
TOPICAL | Status: DC | PRN
  Administered 2020-10-02: 4000 mL

## 2020-10-02 MED ORDER — MILRINONE LACTATE IN DEXTROSE 20-5 MG/100ML-% IV SOLN
0.3000 ug/kg/min | INTRAVENOUS | Status: DC
  Filled 2020-10-02: qty 100

## 2020-10-02 MED ORDER — SODIUM CHLORIDE 0.9 % IV BOLUS
2000.0000 mL | Freq: Once | INTRAVENOUS | Status: AC
  Administered 2020-10-02: 1500 mL via INTRAVENOUS

## 2020-10-02 MED ORDER — DEXMEDETOMIDINE HCL IN NACL 400 MCG/100ML IV SOLN
0.1000 ug/kg/h | INTRAVENOUS | Status: AC
  Administered 2020-10-02: .5 ug/kg/h via INTRAVENOUS
  Filled 2020-10-02: qty 100

## 2020-10-02 MED ORDER — INSULIN REGULAR(HUMAN) IN NACL 100-0.9 UT/100ML-% IV SOLN
INTRAVENOUS | Status: AC
  Administered 2020-10-02: 6.5 [IU]/h via INTRAVENOUS
  Filled 2020-10-02: qty 100

## 2020-10-02 MED ORDER — MAGNESIUM SULFATE 4 GM/100ML IV SOLN
4.0000 g | Freq: Once | INTRAVENOUS | Status: AC
  Administered 2020-10-02: 4 g via INTRAVENOUS
  Filled 2020-10-02: qty 100

## 2020-10-02 SURGICAL SUPPLY — 58 items
BAG URIMETER BARDEX IC 350 (UROLOGICAL SUPPLIES) ×3 IMPLANT
BLADE SURG 11 STRL SS (BLADE) IMPLANT
CANISTER SUCT 3000ML PPV (MISCELLANEOUS) ×3 IMPLANT
CATH FOLEY 2WAY SLVR  5CC 16FR (CATHETERS) ×3
CATH FOLEY 2WAY SLVR 5CC 16FR (CATHETERS) ×2 IMPLANT
CATH THORACIC 36FR (CATHETERS) ×1 IMPLANT
CLIP VESOCCLUDE SM WIDE 24/CT (CLIP) IMPLANT
CNTNR URN SCR LID CUP LEK RST (MISCELLANEOUS) ×2 IMPLANT
CONN ST 1/4X3/8  BEN (MISCELLANEOUS) ×3
CONN ST 1/4X3/8 BEN (MISCELLANEOUS) IMPLANT
CONT SPEC 4OZ STRL OR WHT (MISCELLANEOUS) ×3
COVER SURGICAL LIGHT HANDLE (MISCELLANEOUS) ×6 IMPLANT
DRAIN CHANNEL 32F RND 10.7 FF (WOUND CARE) ×4 IMPLANT
DRAPE CARDIOVASC SPLIT 88X140 (DRAPES) ×1 IMPLANT
DRESSING AQUACEL AG SP 3.5X6 (GAUZE/BANDAGES/DRESSINGS) ×1 IMPLANT
DRSG AQUACEL AG ADV 3.5X14 (GAUZE/BANDAGES/DRESSINGS) ×1 IMPLANT
DRSG AQUACEL AG SP 3.5X6 (GAUZE/BANDAGES/DRESSINGS) ×3
ELECT BLADE 6.5 EXT (BLADE) ×1 IMPLANT
ELECT REM PT RETURN 9FT ADLT (ELECTROSURGICAL) ×3
ELECTRODE REM PT RTRN 9FT ADLT (ELECTROSURGICAL) ×2 IMPLANT
FIBERTAPE STERNAL CLSR 2 36IN (SUTURE) ×3 IMPLANT
FIBERTAPE STERNAL CLSR 2X36 (SUTURE) ×4 IMPLANT
GAUZE SPONGE 4X4 12PLY STRL (GAUZE/BANDAGES/DRESSINGS) ×3 IMPLANT
GAUZE SPONGE 4X4 12PLY STRL LF (GAUZE/BANDAGES/DRESSINGS) ×2 IMPLANT
GLOVE ORTHO TXT STRL SZ7.5 (GLOVE) ×6 IMPLANT
GOWN STRL REUS W/ TWL LRG LVL3 (GOWN DISPOSABLE) ×4 IMPLANT
GOWN STRL REUS W/TWL LRG LVL3 (GOWN DISPOSABLE) ×9
HEMOSTAT POWDER SURGIFOAM 1G (HEMOSTASIS) ×10 IMPLANT
INSERT FOGARTY XLG (MISCELLANEOUS) ×1 IMPLANT
KIT BASIN OR (CUSTOM PROCEDURE TRAY) ×3 IMPLANT
KIT SUCTION CATH 14FR (SUCTIONS) ×6 IMPLANT
KIT TURNOVER KIT B (KITS) ×3 IMPLANT
LEAD PACING MYOCARDI (MISCELLANEOUS) ×1 IMPLANT
NDL SUT PASSING CERCLAG MED (SUTURE) IMPLANT
NDL SUT PASSING CERCLAGE MED (SUTURE) ×6
NEEDLE SUT PASSING CERCLAG MED (SUTURE) ×4 IMPLANT
NS IRRIG 1000ML POUR BTL (IV SOLUTION) ×8 IMPLANT
PACK E OPEN HEART (SUTURE) ×1 IMPLANT
PACK OPEN HEART (CUSTOM PROCEDURE TRAY) ×1 IMPLANT
PAD ARMBOARD 7.5X6 YLW CONV (MISCELLANEOUS) ×6 IMPLANT
SET IRRIG TUBING LAPAROSCOPIC (IRRIGATION / IRRIGATOR) ×1 IMPLANT
SUT MNCRL AB 3-0 PS2 18 (SUTURE) ×2 IMPLANT
SUT PDS AB 1 CTX 36 (SUTURE) ×2 IMPLANT
SUT PROLENE 2 0 MH 48 (SUTURE) ×4 IMPLANT
SUT PROLENE 4 0 RB 1 (SUTURE) ×6
SUT PROLENE 4-0 RB1 .5 CRCL 36 (SUTURE) IMPLANT
SUT SILK 2 0 SH CR/8 (SUTURE) ×3 IMPLANT
SUT VIC AB 2-0 CT1 27 (SUTURE) ×3
SUT VIC AB 2-0 CT1 TAPERPNT 27 (SUTURE) ×1 IMPLANT
SUT VIC AB 2-0 CTX 36 (SUTURE) ×6 IMPLANT
SUT VIC AB 3-0 SH 8-18 (SUTURE) ×1 IMPLANT
SYSTEM SAHARA CHEST DRAIN ATS (WOUND CARE) ×3 IMPLANT
TAPE CLOTH SURG 4X10 WHT LF (GAUZE/BANDAGES/DRESSINGS) ×2 IMPLANT
TAPE PAPER 2X10 WHT MICROPORE (GAUZE/BANDAGES/DRESSINGS) ×1 IMPLANT
TOWEL GREEN STERILE (TOWEL DISPOSABLE) ×3 IMPLANT
TOWEL GREEN STERILE FF (TOWEL DISPOSABLE) ×3 IMPLANT
TUBE CONNECTING 12X1/4 (SUCTIONS) ×1 IMPLANT
WATER STERILE IRR 1000ML POUR (IV SOLUTION) ×3 IMPLANT

## 2020-10-02 NOTE — Progress Notes (Signed)
Rapid wean initiated per protocol

## 2020-10-02 NOTE — ED Notes (Signed)
Dr. Caryl Comes, Cardiology at the bedside with ED provider Dr. Roderic Palau.

## 2020-10-02 NOTE — ED Notes (Signed)
Provider at the bedside to evaluate.

## 2020-10-02 NOTE — Anesthesia Postprocedure Evaluation (Signed)
Anesthesia Post Note  Patient: [name]  Procedure(s) Performed: MEDIAN STERNOTOMY REPAIR PERFORATED VENTRICLE EVACUATION LEFT HEMOTHORAX TRANSESOPHAGEAL ECHOCARDIOGRAM (TEE) ICD LEAD REMOVAL GENERATOR REMOVAL     Patient location during evaluation: SICU Anesthesia Type: General Level of consciousness: sedated and patient remains intubated per anesthesia plan Pain management: pain level controlled Vital Signs Assessment: post-procedure vital signs reviewed and stable Respiratory status: patient remains intubated per anesthesia plan and patient on ventilator - see flowsheet for VS Cardiovascular status: stable Anesthetic complications: no   No notable events documented.  Last Vitals:  Vitals:   10/02/20 1730 10/02/20 1745  BP: 114/85 105/87  Pulse: 83 98  Resp: 10 18  Temp: (!) 35.1 C (!) 35.2 C  SpO2: 100% 100%    Last Pain:  Vitals:   10/02/20 1700  TempSrc: Bladder  PainSc:                  Christan Ciccarelli COKER

## 2020-10-02 NOTE — Progress Notes (Signed)
  Echocardiogram Echocardiogram Transesophageal has been performed.  Erin Fields 10/02/2020, 3:22 PM

## 2020-10-02 NOTE — Procedures (Signed)
Extubation Procedure Note  Patient Details:   Name: Erin Fields DOB: 27-Dec-1965 MRN: [id number]   Airway Documentation:    Vent end date: 10/02/20 Vent end time: 2045   Evaluation  O2 sats: stable throughout Complications: No apparent complications Patient did tolerate procedure well. Bilateral Breath Sounds: Clear, Diminished   Yes  NIF: -25 VC: 963m  IS 500 best of 3 attempts. Positive cuff lea. Pt extubated to 5L [name] per protocol. No stridor noted   BClance Boll6/18/2022, 8:57 PM

## 2020-10-02 NOTE — ED Notes (Signed)
Critical care at bedside.

## 2020-10-02 NOTE — ED Notes (Signed)
18g established in the left AC, 1LNS fluid bolus started

## 2020-10-02 NOTE — ED Notes (Signed)
Pt transferred to to EMS stretcher from CT table for transport to [facility], with Tamala Fothergill, RN accompanying.

## 2020-10-02 NOTE — ED Notes (Addendum)
Pt to [facility] ED with this Probation officer via EMS. Pt cont monitored. Levophed and NS infusing. Pt alert, able to answer questions at this time.

## 2020-10-02 NOTE — ED Notes (Signed)
Per critical care hospitalist's verbal order, 73mq/50mL bi-card given at this time via IV.

## 2020-10-02 NOTE — Discharge Instructions (Addendum)
Discharge Instructions:  1. You may shower, please wash incisions daily with soap and water and keep dry.  If you wish to cover wounds with dressing you may do so but please keep clean and change daily.  No tub baths or swimming until incisions have completely healed.  If your incisions become red or develop any drainage please call our office at [phone]  2. No Driving until cleared by Dr. Guy Sandifer office and you are no longer using narcotic pain medications  3. Monitor your weight daily.. Please use the same scale and weigh at same time... If you gain 5-10 lbs in 48 hours with associated lower extremity swelling, please contact our office at [phone]  4. Fever of 101.5 for at least 24 hours with no source, please contact our office at [phone]  5. Activity- up as tolerated, please walk at least 3 times per day.  Avoid strenuous activity, no lifting, pushing, or pulling with your arms over 8-10 lbs for a minimum of 6 weeks  6. If any questions or concerns arise, please do not hesitate to contact our office at [phone]

## 2020-10-02 NOTE — Progress Notes (Signed)
Increased rate to 16 s/p ABG and per discussion w/  Dr Roxy Manns in room.  RN aware.

## 2020-10-02 NOTE — Anesthesia Procedure Notes (Signed)
Arterial Line Insertion Start/End6/18/2022 2:05 PM, 10/02/2020 2:12 PM Performed by: Rande Brunt, CRNA  Patient location: OR. Preanesthetic checklist: patient identified, IV checked, site marked, risks and benefits discussed, surgical consent, monitors and equipment checked, pre-op evaluation, timeout performed and anesthesia consent Left, radial was placed Catheter size: 20 G Hand hygiene performed  Allen's test indicative of satisfactory collateral circulation Attempts: 1 Procedure performed without using ultrasound guided technique. Following insertion, dressing applied and Biopatch. Post procedure assessment: normal  Patient tolerated the procedure well with no immediate complications.

## 2020-10-02 NOTE — ED Notes (Signed)
Opt plcaed on 2L [place] d/t O2 sats at 88%.

## 2020-10-02 NOTE — ED Notes (Signed)
Pt to CT at this time per Dr. Caryl Comes and [place].

## 2020-10-02 NOTE — Consult Note (Signed)
[address]       [place] [address]             [phone]          CARDIOTHORACIC SURGERY CONSULTATION REPORT  PCP is Copland, Gay Filler, MD Referring Provider is Virl Axe, MD Primary Cardiologist is Jenne Campus, MD  Reason for consultation:  left hemothorax with perforated right ventricle  HPI:  Patient is a 55 year old morbidly obese female with history of dilated nonischemic cardiomyopathy who has been referred for emergent surgical consultation due to development of left hemothorax, likely perforation of the right ventricle, and hypovolemic shock.  Patient underwent placement of single lead ICD October 01, 2020 by Dr. Curt Bears and presented to [facility] earlier today with severe chest pain and shortness of breath.  Chest x-ray revealed left hemothorax and blood work revealed anemia.  The patient was notably tachycardic and hypotensive, consistent with hypovolemic shock.  Noncontrast CT scan of the chest revealed perforation of the right ventricle with the tip of the patient's ventricular lead extending outside of the heart into the left pleural space.  The patient was promptly transferred to [facility] where she was immediately evaluated upon her arrival in the emergency department.  Patient complains of severe resting shortness of breath and pain across her left chest.  She is gasping for air.  Symptoms of chest pain reportedly began the evening before and became acutely worse earlier this morning.  Remainder of her review of systems is noncontributory.  Past Medical History:  Diagnosis Date   Allergic rhinitis due to animal (cat) (dog) hair and dander 04/27/2020   Allergic rhinitis due to pollen 04/27/2020   Anxiety    Anxiety and depression 01/20/2018   Asthma    Asthma, persistent controlled 10/05/2018   B12 deficiency 11/27/2019   Borderline diabetes 12/17/2015   Cancer ([place]) 2019   right breast cancer    CERVICAL CANCER, HX OF 12/06/2006   Qualifier: Diagnosis of  By: Arnoldo Morale MD, John E    Chronic sinusitis 01/26/2015   Chronic venous insufficiency 09/02/2015   Colon polyps    Congestive heart failure ([place]) 04/29/2020   Controlled type 2 diabetes mellitus without complication, without long-term current use of insulin ([place]) 05/28/2017   Coronary artery disease 25% of RCA, 20% intermediate branch, 40% diagonal branch based on cardiac cath from 2022 07/22/2020   Crohn disease ([place]) 11/29/2016   Depression    Diabetes mellitus without complication ([place])    type 2   Dilated cardiomyopathy ([place]) ejection fraction 25% by echocardiogram from January 2022 04/29/2020   Dyspnea 09/15/2014   Elevated triglycerides with high cholesterol 11/26/2013   Environmental allergies    Eustachian tube dysfunction 12/04/2014   Ex-smoker 11/18/2015   Family history of breast cancer    Genetic testing 07/16/2017   Negative genetic testing on the multi-cancer panel.  The Multi-Gene Panel offered by [name] includes sequencing and/or deletion duplication testing of the following 83 genes: ALK, APC, ATM, AXIN2,BAP1,  BARD1, BLM, BMPR1A, BRCA1, BRCA2, BRIP1, CASR, CDC73, CDH1, CDK4, CDKN1B, CDKN1C, CDKN2A (p14ARF), CDKN2A (p16INK4a), CEBPA, CHEK2, CTNNA1, DICER1, DIS3L2, EGFR (c.2369C>T, p.Thr790Met variant onl   GERD (gastroesophageal reflux disease)    HEADACHE 12/06/2006   Qualifier: Diagnosis of  By: Arnoldo Morale MD, John E    History of adenomatous polyp of colon 11/19/2017   Hyperlipidemia    Hypertension    Hypothyroidism    IDA (iron deficiency anemia)  11/06/2019   Impaired fasting glucose 12/12/2017   Insomnia 08/20/2013   Irritable bowel syndrome 11/29/2006   Qualifier: Diagnosis of  By: Arnoldo Morale MD, John E    Left sided abdominal pain 12/13/2017   Liver hemangioma 08/24/2014   Malignant neoplasm of upper-outer quadrant of right breast in female, estrogen receptor positive ([place]) 03/20/2017   Migraines    Mild persistent asthma,  uncomplicated 1/61/0960   Nasal polyp, unspecified 01/10/2018   Oral herpes 04/20/2013   Other allergic rhinitis 09/15/2014   Personal history of radiation therapy 2019   36 radiation tx   PONV (postoperative nausea and vomiting)    scopolamine patch helps   Recurrent infections 10/05/2018   Right upper lobe pulmonary nodule 01/10/2018   Symptomatic varicose veins, right 09/02/2015   Ulcerative colitis ([place])    Varicose veins     Past Surgical History:  Procedure Laterality Date   BREAST BIOPSY Right 02/2017   BREAST BIOPSY Right 11/07/2019   BREAST LUMPECTOMY Right 04/24/2017   BREAST LUMPECTOMY WITH RADIOACTIVE SEED AND SENTINEL LYMPH NODE BIOPSY Right 04/24/2017   Procedure: RIGHT BREAST LUMPECTOMY WITH RADIOACTIVE SEED AND SENTINEL LYMPH NODE BIOPSY;  Surgeon: [name], MD;  Location: [facility];  Service: General;  Laterality: Right;   crapal tunnel relase Left 05/2018   ENDOMETRIAL ABLATION  2010   Had abnormal uterine bleeding (heavy and frequent), performed in [place], menses stopped completely 2 years after   EXPLORATORY LAPAROTOMY     Had exploratory surgery at the age of 21 for abdominal pain with cyst found on her left ovary, not treated surgically   FOOT SURGERY Right 2012   HAND SURGERY Right    RE-EXCISION OF BREAST LUMPECTOMY Right 05/15/2017   Procedure: RE-EXCISION OF BREAST LUMPECTOMY;  Surgeon: [name], MD;  Location: [place];  Service: General;  Laterality: Right;   RIGHT/LEFT HEART CATH AND CORONARY ANGIOGRAPHY N/A 06/21/2020   Procedure: RIGHT/LEFT HEART CATH AND CORONARY ANGIOGRAPHY;  Surgeon: Nelva Bush, MD;  Location: [facility];  Service: Cardiovascular;  Laterality: N/A;   ROBOTIC ASSISTED SALPINGO OOPHERECTOMY Bilateral 08/29/2019   Procedure: XI ROBOTIC ASSISTED SALPINGO OOPHORECTOMY;  Surgeon: [name], MD;  Location: [facility];  Service: Gynecology;  Laterality: Bilateral;    Septoplasty with turbinate reduction     TONSILLECTOMY  1980   VEIN SURGERY     Removal Right Leg   WISDOM TOOTH EXTRACTION      Family History  Problem Relation Age of Onset   Arthritis Mother 23       Deceased   Breast cancer Mother    Lung cancer Mother    Hyperlipidemia Mother    Heart disease Mother    Hypertension Mother    Diabetes Mother    Crohn's disease Mother    Diabetes Maternal Grandfather    Heart disease Maternal Grandfather    Heart attack Maternal Grandfather    Leukemia Maternal Grandfather    Colitis Sister    Leukemia Cousin 3       mat cousin   Lung cancer Father    Brain cancer Father     Social History   Socioeconomic History   Marital status: Widowed    Spouse name: Not on file   Number of children: Not on file   Years of education: Not on file   Highest education level: Not on file  Occupational History   Not on file  Tobacco Use  Smoking status: Former    Packs/day: 3.00    Years: 15.00    Pack years: 45.00    Types: Cigarettes    Quit date: 09/14/1993    Years since quitting: 27.0   Smokeless tobacco: Never   Tobacco comments:    Quit 26 yrs ago  Vaping Use   Vaping Use: Never used  Substance and Sexual Activity   Alcohol use: Yes    Comment: Drinks a couple every 2-3 months   Drug use: No   Sexual activity: Not Currently    Comment: ablation  Other Topics Concern   Not on file  Social History Narrative   Not on file   Social Determinants of Health   Financial Resource Strain: Not on file  Food Insecurity: Not on file  Transportation Needs: Not on file  Physical Activity: Not on file  Stress: Not on file  Social Connections: Not on file  Intimate Partner Violence: Not on file    Prior to Admission medications   Medication Sig Start Date End Date Taking? Authorizing Provider  acyclovir (ZOVIRAX) 400 MG tablet TAKE 1 TABLET (400 MG TOTAL) BY MOUTH 3 (THREE) TIMES DAILY AS NEEDED. Patient taking differently: Take  400 mg by mouth 3 (three) times daily as needed (outbreaks). 09/24/19   Copland, Gay Filler, MD  Adalimumab 80 MG/0.8ML PNKT Inject 80 mg into the skin every 14 (fourteen) days. 12/01/19   [provider]  albuterol (VENTOLIN HFA) 108 (90 Base) MCG/ACT inhaler Inhale 2 puffs into the lungs every 6 (six) hours as needed for shortness of breath. 09/22/20   [provider]  ascorbic acid (VITAMIN C) 1000 MG tablet Take 1,000 mg by mouth daily.    [provider]  aspirin EC 81 MG tablet Take 81 mg by mouth daily. Swallow whole.    [provider]  Azelastine HCl 0.15 % SOLN Place 2 sprays into both nostrils 2 (two) times daily. For 30 days 07/16/20   [provider]  Biotin 1000 MCG tablet Take 1,000 mcg by mouth daily.    [provider]  Calcium Carb-Cholecalciferol (CALCIUM+D3) 600-800 MG-UNIT TABS Take 1 tablet by mouth daily.    [provider]  carvedilol (COREG) 12.5 MG tablet Take 1 tablet (12.5 mg total) by mouth 2 (two) times daily. 08/24/20   Park Liter, MD  cetirizine (ZYRTEC) 10 MG tablet Take 10 mg by mouth daily.    [provider]  cyclobenzaprine (FLEXERIL) 5 MG tablet Take 1 tablet (5 mg total) by mouth 3 (three) times daily as needed for up to 7 days for muscle spasms. 10/01/20 10/08/20  Horton, Alvin Critchley, DO  diphenoxylate-atropine (LOMOTIL) 2.5-0.025 MG tablet Take 2 tablets by mouth 4 (four) times daily as needed for diarrhea or loose stools.    [provider]  doxycycline (VIBRAMYCIN) 100 MG capsule Take 1 capsule (100 mg total) by mouth 2 (two) times daily. 09/27/20   Copland, Gay Filler, MD  ENTRESTO 49-51 MG TAKE 1 TABLET BY MOUTH TWICE A DAY Patient taking differently: Take 1 tablet by mouth 2 (two) times daily. 09/06/20   Park Liter, MD  EPINEPHrine 0.3 mg/0.3 mL IJ SOAJ injection Inject 0.3 mg into the muscle as needed for anaphylaxis.    [provider]  exemestane (AROMASIN) 25  MG tablet Take 25 mg by mouth daily after breakfast.    [provider]  Glucosamine-Chondroit-Vit C-Mn (GLUCOSAMINE-CHONDROITIN) CAPS Take 1 capsule by mouth daily.  [provider]  HYDROcodone-acetaminophen (NORCO/VICODIN) 5-325 MG tablet Take 1 tablet by mouth every 6 (six) hours as needed for up to 20 doses for severe pain or moderate pain. 09/16/20   Copland, Gay Filler, MD  Hyoscyamine Sulfate 0.375 MG TBCR Take 0.375 mg by mouth in the morning and at bedtime.    [provider]  lansoprazole (PREVACID SOLUTAB) 15 MG disintegrating tablet Take 15 mg by mouth 2 (two) times daily before a meal.    [provider]  levothyroxine (SYNTHROID) 75 MCG tablet Take 1 tablet (75 mcg total) by mouth daily before breakfast. 09/27/20   Copland, Gay Filler, MD  MAGNESIUM PO Take 250 mg by mouth daily.    [provider]  meloxicam (MOBIC) 15 MG tablet Take 15 mg by mouth daily as needed for pain (Hip pain).    [provider]  Menthol, Topical Analgesic, (BIOFREEZE COLORLESS) 4 % GEL Apply 1 application topically daily as needed (Hip pain).    [provider]  mesalamine (LIALDA) 1.2 g EC tablet Take 2.4 g by mouth in the morning and at bedtime.     [provider]  metFORMIN (GLUCOPHAGE) 500 MG tablet Take 1 tablet (500 mg total) by mouth 2 (two) times daily with a meal. 06/24/20   End, Harrell Gave, MD  montelukast (SINGULAIR) 10 MG tablet Take 10 mg by mouth at bedtime.     [provider]  Omega 3 1000 MG CAPS Take 2,000 mg by mouth every other day.    [provider]  promethazine (PHENERGAN) 25 MG tablet Take 0.5-1 tablets (12.5-25 mg total) by mouth daily as needed for nausea or vomiting. 04/13/20   Copland, Gay Filler, MD  rosuvastatin (CRESTOR) 20 MG tablet Take 1 tablet (20 mg total) by mouth daily. Patient taking differently: Take 20 mg by mouth at bedtime. 06/18/19   Copland, Gay Filler, MD  spironolactone  (ALDACTONE) 25 MG tablet Take 0.5 tablets (12.5 mg total) by mouth daily. 07/30/20 10/28/20  Park Liter, MD  SYMBICORT 160-4.5 MCG/ACT inhaler INHALE 2 PUFFS INTO THE LUNGS TWICE A DAY Patient taking differently: Inhale 2 puffs into the lungs in the morning and at bedtime. 09/06/20   Saguier, Percell Miller, PA-C  triamcinolone (NASACORT) 55 MCG/ACT AERO nasal inhaler Place 2 sprays into the nose daily.    [provider]  Turmeric 500 MG CAPS Take 500 mg by mouth daily.    [provider]  venlafaxine XR (EFFEXOR-XR) 150 MG 24 hr capsule Take 1 capsule (150 mg total) by mouth daily with breakfast. 05/10/20   Copland, Gay Filler, MD  zinc gluconate 50 MG tablet Take 50 mg by mouth daily.    [provider]  zolpidem (AMBIEN) 5 MG tablet TAKE 1 TABLET BY MOUTH EVERY DAY AT BEDTIME AS NEEDED FOR SLEEP Patient taking differently: Take 5 mg by mouth at bedtime. 09/09/20   Copland, Gay Filler, MD    Current Facility-Administered Medications  Medication Dose Route Frequency Provider Last Rate Last Admin   0.9 %  sodium chloride infusion  250 mL Intravenous Continuous Brand Males, MD       docusate sodium (COLACE) capsule 100 mg  100 mg Oral BID PRN Brand Males, MD       insulin aspart (novoLOG) injection 0-15 Units  0-15 Units Subcutaneous Q4H Brand Males, MD       norepinephrine (LEVOPHED) 8m in 2562mpremix infusion  2-10 mcg/min Intravenous Titrated RaBrand MalesMD 3.75 mL/hr at  10/02/20 1301 1 mcg/min at 10/02/20 1301   pantoprazole (PROTONIX) injection 40 mg  40 mg Intravenous QHS Brand Males, MD       polyethylene glycol (MIRALAX / GLYCOLAX) packet 17 g  17 g Oral Daily PRN Brand Males, MD       sodium bicarbonate 1 mEq/mL injection            sodium bicarbonate 150 mEq in sterile water 1,150 mL infusion   Intravenous Continuous Brand Males, MD       sodium bicarbonate injection 50 mEq  50 mEq Intravenous Once Brand Males, MD        Current Outpatient Medications  Medication Sig Dispense Refill   acyclovir (ZOVIRAX) 400 MG tablet TAKE 1 TABLET (400 MG TOTAL) BY MOUTH 3 (THREE) TIMES DAILY AS NEEDED. (Patient taking differently: Take 400 mg by mouth 3 (three) times daily as needed (outbreaks).) 270 tablet 1   Adalimumab 80 MG/0.8ML PNKT Inject 80 mg into the skin every 14 (fourteen) days.     albuterol (VENTOLIN HFA) 108 (90 Base) MCG/ACT inhaler Inhale 2 puffs into the lungs every 6 (six) hours as needed for shortness of breath.     ascorbic acid (VITAMIN C) 1000 MG tablet Take 1,000 mg by mouth daily.     aspirin EC 81 MG tablet Take 81 mg by mouth daily. Swallow whole.     Azelastine HCl 0.15 % SOLN Place 2 sprays into both nostrils 2 (two) times daily. For 30 days     Biotin 1000 MCG tablet Take 1,000 mcg by mouth daily.     Calcium Carb-Cholecalciferol (CALCIUM+D3) 600-800 MG-UNIT TABS Take 1 tablet by mouth daily.     carvedilol (COREG) 12.5 MG tablet Take 1 tablet (12.5 mg total) by mouth 2 (two) times daily. 60 tablet 2   cetirizine (ZYRTEC) 10 MG tablet Take 10 mg by mouth daily.     cyclobenzaprine (FLEXERIL) 5 MG tablet Take 1 tablet (5 mg total) by mouth 3 (three) times daily as needed for up to 7 days for muscle spasms. 21 tablet 0   diphenoxylate-atropine (LOMOTIL) 2.5-0.025 MG tablet Take 2 tablets by mouth 4 (four) times daily as needed for diarrhea or loose stools.     doxycycline (VIBRAMYCIN) 100 MG capsule Take 1 capsule (100 mg total) by mouth 2 (two) times daily. 20 capsule 0   ENTRESTO 49-51 MG TAKE 1 TABLET BY MOUTH TWICE A DAY (Patient taking differently: Take 1 tablet by mouth 2 (two) times daily.) 180 tablet 3   EPINEPHrine 0.3 mg/0.3 mL IJ SOAJ injection Inject 0.3 mg into the muscle as needed for anaphylaxis.     exemestane (AROMASIN) 25 MG tablet Take 25 mg by mouth daily after breakfast.     Glucosamine-Chondroit-Vit C-Mn (GLUCOSAMINE-CHONDROITIN) CAPS Take 1 capsule by mouth daily.      HYDROcodone-acetaminophen (NORCO/VICODIN) 5-325 MG tablet Take 1 tablet by mouth every 6 (six) hours as needed for up to 20 doses for severe pain or moderate pain. 20 tablet 0   Hyoscyamine Sulfate 0.375 MG TBCR Take 0.375 mg by mouth in the morning and at bedtime.     lansoprazole (PREVACID SOLUTAB) 15 MG disintegrating tablet Take 15 mg by mouth 2 (two) times daily before a meal.     levothyroxine (SYNTHROID) 75 MCG tablet Take 1 tablet (75 mcg total) by mouth daily before breakfast. 30 tablet 6   MAGNESIUM PO Take 250 mg by mouth daily.     meloxicam (MOBIC) 15 MG  tablet Take 15 mg by mouth daily as needed for pain (Hip pain).     Menthol, Topical Analgesic, (BIOFREEZE COLORLESS) 4 % GEL Apply 1 application topically daily as needed (Hip pain).     mesalamine (LIALDA) 1.2 g EC tablet Take 2.4 g by mouth in the morning and at bedtime.      metFORMIN (GLUCOPHAGE) 500 MG tablet Take 1 tablet (500 mg total) by mouth 2 (two) times daily with a meal. 180 tablet 3   montelukast (SINGULAIR) 10 MG tablet Take 10 mg by mouth at bedtime.      Omega 3 1000 MG CAPS Take 2,000 mg by mouth every other day.     promethazine (PHENERGAN) 25 MG tablet Take 0.5-1 tablets (12.5-25 mg total) by mouth daily as needed for nausea or vomiting. 30 tablet 5   rosuvastatin (CRESTOR) 20 MG tablet Take 1 tablet (20 mg total) by mouth daily. (Patient taking differently: Take 20 mg by mouth at bedtime.) 90 tablet 3   spironolactone (ALDACTONE) 25 MG tablet Take 0.5 tablets (12.5 mg total) by mouth daily. 30 tablet 1   SYMBICORT 160-4.5 MCG/ACT inhaler INHALE 2 PUFFS INTO THE LUNGS TWICE A DAY (Patient taking differently: Inhale 2 puffs into the lungs in the morning and at bedtime.) 30.6 each 1   triamcinolone (NASACORT) 55 MCG/ACT AERO nasal inhaler Place 2 sprays into the nose daily.     Turmeric 500 MG CAPS Take 500 mg by mouth daily.     venlafaxine XR (EFFEXOR-XR) 150 MG 24 hr capsule Take 1 capsule (150 mg total) by mouth  daily with breakfast. 90 capsule 1   zinc gluconate 50 MG tablet Take 50 mg by mouth daily.     zolpidem (AMBIEN) 5 MG tablet TAKE 1 TABLET BY MOUTH EVERY DAY AT BEDTIME AS NEEDED FOR SLEEP (Patient taking differently: Take 5 mg by mouth at bedtime.) 30 tablet 2    Allergies  Allergen Reactions   Losartan Potassium Shortness Of Breath and Other (See Comments)   Ace Inhibitors Cough   Aspirin Other (See Comments)    Upset stomach   Cefaclor Hives and Other (See Comments)   Oxycodone Hives   Sulfa Antibiotics Hives   Lisinopril Cough and Other (See Comments)      Review of Systems:  Per HPI - remainder non-contributory    Physical Exam:   BP (!) 88/70 (BP Location: Right Arm)   Pulse (!) 125   Temp 97.7 F (36.5 C) (Rectal)   Resp (!) 22   SpO2 100%   General:  Obese female in acute distress   HEENT:  Unremarkable   Neck:   no JVD  Chest:   Diminished breath sounds on left side   CV:   RRR, no murmur   Abdomen:  soft, non-tender  Extremities:  Cool, clammy  Rectal/GU  Deferred  Neuro:   Grossly non-focal and symmetrical throughout  Skin:   Clean and dry  Diagnostic Tests:  Lab Results: Recent Labs    10/01/20 2038 10/02/20 1139 10/02/20 1149  WBC 16.6* 25.8*  --   HGB 12.9 11.6* 11.9*  HCT 38.3 35.1* 35.0*  PLT 434* 498*  --    BMET:  Recent Labs    10/01/20 2038 10/02/20 1139 10/02/20 1149  NA 135 130* 129*  K 3.7 4.0 4.0  CL 103 95* 95*  CO2 23 23  --   GLUCOSE 131* 328* 319*  BUN 7 5* 3*  CREATININE 0.56 0.81 0.60  CALCIUM 9.2 9.4  --     CBG (last 3)  Recent Labs    10/01/20 1138 10/01/20 1430 10/02/20 1151  GLUCAP 102* 120* 348*   PT/INR:  No results for input(s): LABPROT, INR in the last 72 hours.  CXR: PORTABLE CHEST 1 VIEW   COMPARISON:  10/01/2020   FINDINGS: Single lead left-sided implanted cardiac device. The position of the distal lead is now more superiorly oriented compared to the recent previous radiograph. Difficult  to determine the exact location given lack of orthogonal view. Heart size within normal limits. Interval development of a large left-sided pleural effusion. Left basilar opacity, likely compressive atelectasis. Right lung remains clear. No pneumothorax is seen.   IMPRESSION: 1. Interval development of large left-sided pleural effusion. 2. The position of the different later lead is now more superiorly oriented compared to the recent previous radiograph. Difficult to determine the exact location given lack of orthogonal view. Consider follow-up with two view chest x-ray or CT of the chest for further evaluation.   These results were discussed by telephone at the time of interpretation on 10/02/2020 at 12:35 pm with provider Dr Caryl Comes, who verbally acknowledged these results.     Electronically Signed   By: Davina Poke D.O.   On: 10/02/2020 12:35   CT CHEST WITHOUT CONTRAST   TECHNIQUE: Multidetector CT imaging of the chest was performed following the standard protocol without IV contrast.   COMPARISON:  Chest radiograph 10/02/2020   FINDINGS: Cardiovascular: Left chest single lead ICD. Left subclavian lead extends down the SVC into the right heart and the tip is lateral to the right ventricle and suggestive for a cardiac perforation. Heart and mediastinum are slightly shifted towards the right due to the left pleural fluid and left lung densities. Evidence for coronary artery calcifications. No significant pericardial effusion.   Mediastinum/Nodes: Mediastinal is shifted towards the right side of the chest. No significant lymph node enlargement in the chest. Surgical clips in the right axilla.   Lungs/Pleura: Extensive volume loss in the left lung related to a large pleural effusion and bulky hyperdense areas along the periphery of the lung. Lung findings are suggestive for hemorrhage with a hemothorax. Patchy airspace densities in the aerated left lung could be  related to atelectasis or hemorrhage. Right lung is clear except for atelectasis along the medial right upper lobe. No significant right pleural fluid.   Upper Abdomen: Evidence for a high-density tablet in the distal stomach region. No acute abnormality in the upper abdomen.   Musculoskeletal: Soft tissue edema and small amount of gas in the left chest compatible with recent ICD placement. No acute bone abnormality.   IMPRESSION: 1. Right ventricular ICD lead perforation. ICD lead is located left of the right ventricle and appears to be outside of the heart. Large amount of high-density material within the left lung is compatible with hemorrhage or hematoma. New left pleural effusion is compatible with a hemothorax. 2. Mild mediastinal shift towards the right due to the blood products in the left hemithorax.   Critical Value/emergent results were called by telephone at the time of interpretation on 10/02/2020 at 1:19 pm to provider Jolyn Nap, MD, who verbally acknowledged these results.     Electronically Signed   By: Markus Daft M.D.   On: 10/02/2020 13:40     ECHOCARDIOGRAM LIMITED REPORT         Patient Name:   JIMMA ORTMAN Apsey Date of Exam: 10/02/2020  Medical Rec #:  [id number]         Height:       63.5 in  Accession #:    [id number]        Weight:       156.0 lb  Date of Birth:  1965/09/20          BSA:          1.750 m  Patient Age:    74 years          BP:           76/56 mmHg  Patient Gender: F                 HR:           121 bpm.  Exam Location:  Inpatient   Procedure: Limited Echo   STAT ECHO   Indications:    Pericardial Effusion     History:        Patient has prior history of Echocardiogram examinations,  most                  recent 07/23/2020. CHF, CAD; Signs/Symptoms:Chest Pain. AICD                  implanted on 10/01/2020.     Sonographer:    Darlina Sicilian RDCS  Referring Phys: [place]     1. Left ventricular  ejection fraction, by estimation, is 30 to 35%. The  left ventricle has moderately decreased function.   2. Diastolic RV collapse is noted. There is a moderate-sized echogenic  fluid collection overlying the RV, suggestive of hemopericardium.. Right  ventricular systolic function is hyperdynamic. The right ventricular size  is small.   3. Echogenic fluid collection over the RV concering for hemopericardium.  Tamponade physiology is suspected given hypotension, RA/RV collapse,  non-dilated IVC but it does not collapse. Moderate pericardial effusion.  The pericardial effusion is localized  near the right ventricle and surrounding the apex. Findings are consistent  with possible cardiac tamponade.   4. The inferior vena cava is normal in size with <50% respiratory  variability, suggesting right atrial pressure of 8 mmHg.   Comparison(s): Changes from prior study are noted. 07/23/2020: LVEF 20-25%,  no pericardial effusion, IVC normal.   Conclusion(s)/Recommendation(s): Critical findings reported to Dr. Caryl Comes  and acknowledged at 1:25 pm on 10/02/2020. He reported a CT scan  demonstrated a perforated ICD lead - CT surgery is aware.   FINDINGS   Left Ventricle: Left ventricular ejection fraction, by estimation, is 30  to 35%. The left ventricle has moderately decreased function.   Right Ventricle: Diastolic RV collapse is noted. There is a  modearate-sized echogenic fluid collection overlying the RV, suggestive of  hemopericardium. The right ventricular size is small. No increase in right  ventricular wall thickness. Right  ventricular systolic function is hyperdynamic.   Pericardium: Echogenic fluid collection over the RV concering for  hemopericardium. Tamponade physiology is suspected given hypotension,  RA/RV collapse, non-dilated IVC but it does not collapse. A moderately  sized pericardial effusion is present. The  pericardial effusion is localized near the right ventricle and  surrounding  the apex. There is diastolic collapse of the right ventricular free wall  and diastolic collapse of the right atrial wall. There is evidence of  cardiac tamponade.   Venous: The inferior vena cava is normal in size with less than 50%  respiratory variability, suggesting right atrial pressure of  8 mmHg.   Lyman Bishop MD  Electronically signed by Lyman Bishop MD  Signature Date/Time: 10/02/2020/1:33:23 PM       Impression:  Perforation of right ventricle secondary to recently placed transvenous defibrillator lead with massive left hemothorax complicated by acute hypovolemic shock.  Patient needs emergent surgical intervention.   Plan:  I discussed the nature of the patient's problem briefly at the bedside with the patient and her son.  We discussed the emergent nature of the circumstances and the need for prompt surgical intervention.  The patient understands and accepts all potential risks of surgery including but not limited to risk of death, myocardial infarction, respiratory failure, renal failure, bleeding requiring blood transfusion, arrhythmia.  The patient understands the need for removal of the existing defibrillator in order to repair the perforation of the right ventricle.  All the questions have been addressed.      Valentina Gu. Roxy Manns, MD 10/02/2020 1:40 PM

## 2020-10-02 NOTE — Transfer of Care (Signed)
Immediate Anesthesia Transfer of Care Note  Patient: Erin Fields  Procedure(s) Performed: MEDIAN STERNOTOMY REPAIR PERFORATED VENTRICLE EVACUATION LEFT HEMOTHORAX TRANSESOPHAGEAL ECHOCARDIOGRAM (TEE) ICD LEAD REMOVAL GENERATOR REMOVAL  Patient Location: ICU  Anesthesia Type:General  Level of Consciousness: Patient remains intubated per anesthesia plan  Airway & Oxygen Therapy: Patient remains intubated per anesthesia plan and Patient placed on Ventilator (see vital sign flow sheet for setting)  Post-op Assessment: Report given to RN and Post -op Vital signs reviewed and stable  Post vital signs: Reviewed and stable  Last Vitals:  Vitals Value Taken Time  BP 123/76 10/02/20 1630  Temp    Pulse 94 10/02/20 1630  Resp 12 10/02/20 1630  SpO2 99 % 10/02/20 1630  Vitals shown include unvalidated device data.  Last Pain:  Vitals:   10/02/20 1207  TempSrc: Rectal  PainSc:          Complications: No notable events documented.

## 2020-10-02 NOTE — ED Notes (Signed)
Oxygen saturation noted to be 60% on [place]. Patient placed on NRB. Provider at bedside.

## 2020-10-02 NOTE — ED Notes (Signed)
RN transported pt to OR

## 2020-10-02 NOTE — ED Notes (Signed)
Per Critical care hospitalist's verbal order, Levophed started at 35mg/min.

## 2020-10-02 NOTE — Anesthesia Procedure Notes (Signed)
Procedure Name: Intubation Date/Time: 10/02/2020 2:00 PM Performed by: Reece Agar, CRNA Pre-anesthesia Checklist: Patient identified, Emergency Drugs available, Suction available and Patient being monitored Patient Re-evaluated:Patient Re-evaluated prior to induction Oxygen Delivery Method: Circle System Utilized Preoxygenation: Pre-oxygenation with 100% oxygen Induction Type: IV induction, Rapid sequence and Cricoid Pressure applied Ventilation: Unable to mask ventilate Laryngoscope Size: Mac and 3 Grade View: Grade II Tube type: Oral Tube size: 8.0 mm Number of attempts: 1 Airway Equipment and Method: Stylet Placement Confirmation: ETT inserted through vocal cords under direct vision, positive ETCO2 and breath sounds checked- equal and bilateral Secured at: 21 cm Tube secured with: Tape Dental Injury: Teeth and Oropharynx as per pre-operative assessment

## 2020-10-02 NOTE — ED Notes (Signed)
Troponin 121. Received from lab. ED provider Dr. Roderic Palau notified and aware. No additional orders at this time. Will continue to monitor.

## 2020-10-02 NOTE — ED Notes (Addendum)
Per Dr. Ellsworth Lennox verbal order, pt receiving an additional 500cc NS bolus. Pt's BP 67/58. Cardiologist the bedside and aware. Echo at the bedside at this time.

## 2020-10-02 NOTE — Progress Notes (Signed)
  Echocardiogram 2D Echocardiogram limited  has been performed.  Erin Fields M 10/02/2020, 12:51 PM

## 2020-10-02 NOTE — Anesthesia Preprocedure Evaluation (Signed)
Anesthesia Evaluation  Patient identified by MRN, date of birth, ID band Patient awake  General Assessment Comment:Patient agitated restless complaining of SOB, unable to lie flat.  Reviewed: Patient's Chart, lab work & pertinent test results, Unable to perform ROS - Chart review only  Airway Mallampati: III  TM Distance: >3 FB   Mouth opening: Limited Mouth Opening  Dental  (+) Teeth Intact   Pulmonary former smoker,     + decreased breath sounds      Cardiovascular hypertension,  Rhythm:Regular Rate:Tachycardia     Neuro/Psych    GI/Hepatic   Endo/Other  diabetes  Renal/GU      Musculoskeletal   Abdominal   Peds  Hematology   Anesthesia Other Findings   Reproductive/Obstetrics                             Anesthesia Physical Anesthesia Plan  ASA: 4 and emergent  Anesthesia Plan: General   Post-op Pain Management:    Induction: Intravenous  PONV Risk Score and Plan: Ondansetron and Dexamethasone  Airway Management Planned: Oral ETT  Additional Equipment: Arterial line, 3D TEE and Ultrasound Guidance Line Placement  Intra-op Plan:   Post-operative Plan: Post-operative intubation/ventilation  Informed Consent: I have reviewed the patients History and Physical, chart, labs and discussed the procedure including the risks, benefits and alternatives for the proposed anesthesia with the patient or authorized representative who has indicated his/her understanding and acceptance.       Plan Discussed with: CRNA and Anesthesiologist  Anesthesia Plan Comments: (55 year old female 2 days S/P single lead AICD placement for LV dysfunction. Now with L. Hemothorax and probable ventricular perforation. Patient agitated in cardiogenic shock on Levophed 9 mcg/min.)        Anesthesia Quick Evaluation

## 2020-10-02 NOTE — Anesthesia Procedure Notes (Signed)
Central Venous Catheter Insertion Performed by: Roberts Gaudy, MD, anesthesiologist Start/End6/18/2022 2:00 PM, 10/02/2020 2:10 PM Preanesthetic checklist: IV checked, site marked and monitors and equipment checked Position: supine Catheter size: 12 Fr Total catheter length 15. Central line was placed.Triple lumen Procedure performed using ultrasound guided technique. Ultrasound Notes:anatomy identified, needle tip was noted to be adjacent to the nerve/plexus identified and no ultrasound evidence of intravascular and/or intraneural injection Following insertion, line sutured, dressing applied and Biopatch. Post procedure assessment: free fluid flow, blood return through all ports and no air  Patient tolerated the procedure well with no immediate complications.

## 2020-10-02 NOTE — ED Notes (Signed)
Attempted to call [facility] ED main phone as well as charge phone for report, no answer at this time.

## 2020-10-02 NOTE — Progress Notes (Signed)
[facility] BRIEF SICU PROGRESS NOTE  Day of Surgery  S/P Procedure(s) (LRB): MEDIAN STERNOTOMY REPAIR PERFORATED VENTRICLE EVACUATION LEFT HEMOTHORAX (N/A) TRANSESOPHAGEAL ECHOCARDIOGRAM (TEE) (N/A) ICD LEAD REMOVAL (N/A) GENERATOR REMOVAL   Sedated on vent NSR w/ stable BP on low dose levophed  O2 sats 100% Chest tube output low Hgb 11.2  Plan: Continue routine early postop.  Wean vent and levophed as tolerated  Rexene Alberts, MD 10/02/2020 5:35 PM

## 2020-10-02 NOTE — ED Notes (Signed)
Unable to obtain O2 sat and temp at this time. Will re-attempt. Provider at bedside and aware. Pt has a hx of CHF and provider verifies pt to have 1L NS bolus

## 2020-10-02 NOTE — ED Notes (Signed)
Per Provider at the bedside, additional 500cc's NS ordered.

## 2020-10-02 NOTE — ED Notes (Signed)
Pt departed the dept via [facility] transporting to [facility] and Tamala Fothergill, RN accompanying.

## 2020-10-02 NOTE — Brief Op Note (Signed)
10/02/2020  3:06 PM  PATIENT:  Erin Fields  55 y.o. female  PRE-OPERATIVE DIAGNOSIS:  Perforated left right  ventricle  POST-OPERATIVE DIAGNOSIS:  * No post-op diagnosis entered *  PROCEDURE:  Procedure(s): MEDIAN STERNOTOMY REPAIR PERFORATED VENTRICLE EVACUATION LEFT HEMOTHORAX (Right) TRANSESOPHAGEAL ECHOCARDIOGRAM (TEE) (N/A)  SURGEON:  Surgeon(s) and Role:    Rexene Alberts, MD - Primary   ASSISTANTS: Dineen Kid  ANESTHESIA:   general  EBL:  2500 mL  BLOOD ADMINISTERED: per anesthesia record  DRAINS:  left chest tube     LOCAL MEDICATIONS USED:  NONE  SPECIMEN:  No Specimen  DISPOSITION OF SPECIMEN:  N/A  COUNTS:  YES   DICTATION: .Dragon Dictation  PLAN OF CARE: Admit to inpatient   PATIENT DISPOSITION:  ICU - intubated and hemodynamically stable.   Delay start of Pharmacological VTE agent (>24hrs) due to surgical blood loss or risk of bleeding: yes

## 2020-10-02 NOTE — Op Note (Signed)
CARDIOTHORACIC SURGERY OPERATIVE NOTE  Date of Procedure:   10/02/2020  Preoperative Diagnosis:   Iatrogenic perforation of right ventricle secondary to transvenous defibrillator lead Left hemothorax Hypovolemic shock  Postoperative Diagnosis:  same  Procedure:     Median sternotomy Removal of implantable cardiac defibrillator Repair of perforated right ventricle Repair of laceration of left 5th intercostal artery Evacuation of left hemothorax   Surgeon:    Valentina Gu. Roxy Manns, MD  Assistant:    Dineen Kid, CRNFA  Anesthesia:    Roberts Gaudy, MD  Operative Findings:  Perforation of distal right and left ventricle close to the left anterior descending coronary artery with defibrillator lead traversing into the left pleural space and into the chest wall, causing laceration of left 5th intercostal artery Massive left hemothorax     BRIEF CLINICAL NOTE AND INDICATIONS FOR SURGERY  Patient is a 55 year old morbidly obese female with history of dilated nonischemic cardiomyopathy who has been referred for emergent surgical consultation due to development of left hemothorax, likely perforation of the right ventricle, and hypovolemic shock.   Patient underwent placement of single lead ICD October 01, 2020 by Dr. Curt Bears and presented to [facility] earlier today with severe chest pain and shortness of breath.  Chest x-ray revealed left hemothorax and blood work revealed anemia.  The patient was notably tachycardic and hypotensive, consistent with hypovolemic shock.  Noncontrast CT scan of the chest revealed perforation of the right ventricle with the tip of the patient's ventricular lead extending outside of the heart into the left pleural space.  The patient was promptly transferred to [facility] where she was immediately evaluated upon her arrival in the emergency department.  The patient has been seen in consultation and counseled regarding the  indications, risks and potential benefits of surgery.  All questions have been answered, and the patient provides full informed consent for the operation as described.      DETAILS OF THE OPERATIVE PROCEDURE  The patient is brought to the operating room on the above mentioned date and placed in the supine position on the operating table.  The patient was an extremis at the time with ongoing hypovolemic shock and acute hypoxemic respiratory failure.  She was promptly anesthetized under the care of direction of Dr. Roberts Gaudy.  The patient tolerated induction of general endotracheal anesthesia without difficulty.  The patient immediately underwent placement of right internal jugular central venous line for volume resuscitation.  Administration of packed red blood cells was immediately initiated.  Left radial arterial line was placed.  Foley catheter was placed.  Intravenous antibiotics were administered.  The patient's anterior chest, abdomen, and both groins were prepared and draped in sterile manner.  Timeout procedure was performed.  Median sternotomy incision is performed.  There is only a small amount of blood noted in the pericardial sac in the pericardial sac is not distended with any signs of tamponade.  There is a mass amount of blood in the left pleural space which is subsequently open widely.  More than 2.5 L of blood is evacuated as well as an additional amount of clotted blood.  The patient's recent surgical incision in the left deltopectoral groove from ICD placement is reopened sharply.  There is a moderate amount of blood in the sac which is evacuated.  The defibrillator itself is removed from the pocket and the leads are freed up from the subcutaneous tissues.  The pericardium is opened and suspended.  The defibrillator lead is  immediately identified exiting near the very distal apex of the left ventricle on the left side of the distal left anterior descending coronary artery,  approximately 3 or 4 mm from the LAD itself.  The lead then passed directly through the pericardium into the left pleural space and the distal screw and tip of the lead was stuck in the parietal surface of the chest wall.  At the site where the lead was stuck in the chest wall there was severe bleeding from the intercostal artery.  A pair of pledgeted horizontal mattress 2-0 Prolene sutures are placed circumferentially around the exit site of the defibrillator lead from the epicardial surface of the heart.  Care was taken to avoid the left anterior descending coronary artery.  Initially the distal end of the defibrillator lead is pulled off of the parietal chest wall.  The lead was then removed completely from the patient's body.  As soon as the lead is pulled through the left and right ventricle the pursestring sutures are secured.  The chest wall is now examined and there is notably severe bleeding from the fifth intercostal artery.  This is controlled with electrocautery.  Mediastinum and the left pleural space are irrigated with copious saline solution.  The mediastinum and the left pleural space are again reexamined carefully for signs of additional bleeding.  The mediastinum and the left pleural space are drained using a total of 3 chest tubes placed through separate stab incisions inferiorly.  The sternum was closed with Fibertape cerclage.  Soft tissues anterior to the sternum are closed multiple layers and the skin is closed with subcuticular skin closure.  The defibrillator pocket wound is irrigated with saline solution and inspected for hemostasis.  The pocket was then closed in multiple layers and the skin is closed with subcuticular skin closure.  The patient tolerated the procedure remarkably well and stabilized quickly with combination of volume resuscitation and evacuation of the blood from the left pleural space.  The patient was transfused 4 units packed red blood cells intraoperatively for  acute blood loss anemia.  The patient was transported directly to the surgical intensive care unit in stable condition.  There were no intraoperative complications.  All sponge instrument and needle counts were verified correct.     Valentina Gu. Roxy Manns MD 10/02/2020 3:58 PM

## 2020-10-02 NOTE — Consult Note (Addendum)
ELECTROPHYSIOLOGY CONSULT NOTE  Patient ID: Erin Fields, MRN: [id number], DOB/AGE: 1965/11/08 55 y.o. Admit date: 10/02/2020 Date of Consult: 10/02/2020  Primary Physician: Darreld Mclean, MD Primary Cardiologist: [name] is a 55 y.o. female who is being seen today for the evaluation of chest pain at the request of Dr Tollie Pizza .   Chief Complaint: pain and sob    HPI Erin Fields is a 55 y.o. female seen in consult because of post ICD chest pain shock and obpacification of her left lung E 25 % noted 1/22 and NICM confirmed by cath underwent ICD on 6/16 with transient pain last night and eval >> echo no effusion and normal CR-- BP lst pm 170s  Recurrent pain through the night short lived and then unrelenting with dyspnea>> ER  BP this am 70s-80  with HR 10  improved with volume, BP 90's HR 120s; CR opacification of L chest and CT>> perforation of the lead into the  L chest  Volume given.  Discussions with Dr. Chase Caller low-dose Levophed started.      Past Medical History:  Diagnosis Date   Allergic rhinitis due to animal (cat) (dog) hair and dander 04/27/2020   Allergic rhinitis due to pollen 04/27/2020   Anxiety    Anxiety and depression 01/20/2018   Asthma    Asthma, persistent controlled 10/05/2018   B12 deficiency 11/27/2019   Borderline diabetes 12/17/2015   Cancer ([place]) 2019   right breast cancer   CERVICAL CANCER, HX OF 12/06/2006   Qualifier: Diagnosis of  By: Arnoldo Morale MD, John E    Chronic sinusitis 01/26/2015   Chronic venous insufficiency 09/02/2015   Colon polyps    Congestive heart failure ([place]) 04/29/2020   Controlled type 2 diabetes mellitus without complication, without long-term current use of insulin ([place]) 05/28/2017   Coronary artery disease 25% of RCA, 20% intermediate branch, 40% diagonal branch based on cardiac cath from 2022 07/22/2020   Crohn disease ([place]) 11/29/2016   Depression    Diabetes mellitus without complication ([place])    type 2    Dilated cardiomyopathy ([place]) ejection fraction 25% by echocardiogram from January 2022 04/29/2020   Dyspnea 09/15/2014   Elevated triglycerides with high cholesterol 11/26/2013   Environmental allergies    Eustachian tube dysfunction 12/04/2014   Ex-smoker 11/18/2015   Family history of breast cancer    Genetic testing 07/16/2017   Negative genetic testing on the multi-cancer panel.  The Multi-Gene Panel offered by [name] includes sequencing and/or deletion duplication testing of the following 83 genes: ALK, APC, ATM, AXIN2,BAP1,  BARD1, BLM, BMPR1A, BRCA1, BRCA2, BRIP1, CASR, CDC73, CDH1, CDK4, CDKN1B, CDKN1C, CDKN2A (p14ARF), CDKN2A (p16INK4a), CEBPA, CHEK2, CTNNA1, DICER1, DIS3L2, EGFR (c.2369C>T, p.Thr790Met variant onl   GERD (gastroesophageal reflux disease)    HEADACHE 12/06/2006   Qualifier: Diagnosis of  By: Arnoldo Morale MD, John E    History of adenomatous polyp of colon 11/19/2017   Hyperlipidemia    Hypertension    Hypothyroidism    IDA (iron deficiency anemia) 11/06/2019   Impaired fasting glucose 12/12/2017   Insomnia 08/20/2013   Irritable bowel syndrome 11/29/2006   Qualifier: Diagnosis of  By: Arnoldo Morale MD, John E    Left sided abdominal pain 12/13/2017   Liver hemangioma 08/24/2014   Malignant neoplasm of upper-outer quadrant of right breast in female, estrogen receptor positive ([place]) 03/20/2017   Migraines    Mild persistent asthma, uncomplicated 4/82/7078   Nasal polyp, unspecified 01/10/2018   Oral  herpes 04/20/2013   Other allergic rhinitis 09/15/2014   Personal history of radiation therapy 2019   36 radiation tx   PONV (postoperative nausea and vomiting)    scopolamine patch helps   Recurrent infections 10/05/2018   Right upper lobe pulmonary nodule 01/10/2018   Symptomatic varicose veins, right 09/02/2015   Ulcerative colitis ([place])    Varicose veins       Surgical History:  Past Surgical History:  Procedure Laterality Date   BREAST BIOPSY Right 02/2017   BREAST BIOPSY Right  11/07/2019   BREAST LUMPECTOMY Right 04/24/2017   BREAST LUMPECTOMY WITH RADIOACTIVE SEED AND SENTINEL LYMPH NODE BIOPSY Right 04/24/2017   Procedure: RIGHT BREAST LUMPECTOMY WITH RADIOACTIVE SEED AND SENTINEL LYMPH NODE BIOPSY;  Surgeon: Stark , MD;  Location: [facility];  Service: General;  Laterality: Right;   crapal tunnel relase Left 05/2018   ENDOMETRIAL ABLATION  2010   Had abnormal uterine bleeding (heavy and frequent), performed in [place], menses stopped completely 2 years after   EXPLORATORY LAPAROTOMY     Had exploratory surgery at the age of 89 for abdominal pain with cyst found on her left ovary, not treated surgically   FOOT SURGERY Right 2012   HAND SURGERY Right    RE-EXCISION OF BREAST LUMPECTOMY Right 05/15/2017   Procedure: RE-EXCISION OF BREAST LUMPECTOMY;  Surgeon: Stark , MD;  Location: [facility];  Service: General;  Laterality: Right;   RIGHT/LEFT HEART CATH AND CORONARY ANGIOGRAPHY N/A 06/21/2020   Procedure: RIGHT/LEFT HEART CATH AND CORONARY ANGIOGRAPHY;  Surgeon: Nelva Bush, MD;  Location: [facility];  Service: Cardiovascular;  Laterality: N/A;   ROBOTIC ASSISTED SALPINGO OOPHERECTOMY Bilateral 08/29/2019   Procedure: XI ROBOTIC ASSISTED SALPINGO OOPHORECTOMY;  Surgeon: [name], MD;  Location: [facility];  Service: Gynecology;  Laterality: Bilateral;   Septoplasty with turbinate reduction     TONSILLECTOMY  1980   VEIN SURGERY     Removal Right Leg   WISDOM TOOTH EXTRACTION       Home Meds: Prior to Admission medications   Medication Sig Start Date End Date Taking? Authorizing Provider  acyclovir (ZOVIRAX) 400 MG tablet TAKE 1 TABLET (400 MG TOTAL) BY MOUTH 3 (THREE) TIMES DAILY AS NEEDED. Patient taking differently: Take 400 mg by mouth 3 (three) times daily as needed (outbreaks). 09/24/19   Copland, Gay Filler, MD  Adalimumab 80 MG/0.8ML PNKT Inject 80 mg into the skin every 14  (fourteen) days. 12/01/19   [provider]  albuterol (VENTOLIN HFA) 108 (90 Base) MCG/ACT inhaler Inhale 2 puffs into the lungs every 6 (six) hours as needed for shortness of breath. 09/22/20   [provider]  ascorbic acid (VITAMIN C) 1000 MG tablet Take 1,000 mg by mouth daily.    [provider]  aspirin EC 81 MG tablet Take 81 mg by mouth daily. Swallow whole.    [provider]  Azelastine HCl 0.15 % SOLN Place 2 sprays into both nostrils 2 (two) times daily. For 30 days 07/16/20   [provider]  Biotin 1000 MCG tablet Take 1,000 mcg by mouth daily.    [provider]  Calcium Carb-Cholecalciferol (CALCIUM+D3) 600-800 MG-UNIT TABS Take 1 tablet by mouth daily.    [provider]  carvedilol (COREG) 12.5 MG tablet Take 1 tablet (12.5 mg total) by mouth 2 (two) times daily. 08/24/20   Park Liter, MD  cetirizine (ZYRTEC) 10 MG tablet Take 10 mg  by mouth daily.    [provider]  cyclobenzaprine (FLEXERIL) 5 MG tablet Take 1 tablet (5 mg total) by mouth 3 (three) times daily as needed for up to 7 days for muscle spasms. 10/01/20 10/08/20  Horton, Alvin Critchley, DO  diphenoxylate-atropine (LOMOTIL) 2.5-0.025 MG tablet Take 2 tablets by mouth 4 (four) times daily as needed for diarrhea or loose stools.    [provider]  doxycycline (VIBRAMYCIN) 100 MG capsule Take 1 capsule (100 mg total) by mouth 2 (two) times daily. 09/27/20   Copland, Gay Filler, MD  ENTRESTO 49-51 MG TAKE 1 TABLET BY MOUTH TWICE A DAY Patient taking differently: Take 1 tablet by mouth 2 (two) times daily. 09/06/20   Park Liter, MD  EPINEPHrine 0.3 mg/0.3 mL IJ SOAJ injection Inject 0.3 mg into the muscle as needed for anaphylaxis.    [provider]  exemestane (AROMASIN) 25 MG tablet Take 25 mg by mouth daily after breakfast.    [provider]  Glucosamine-Chondroit-Vit C-Mn (GLUCOSAMINE-CHONDROITIN) CAPS Take 1 capsule  by mouth daily.    [provider]  HYDROcodone-acetaminophen (NORCO/VICODIN) 5-325 MG tablet Take 1 tablet by mouth every 6 (six) hours as needed for up to 20 doses for severe pain or moderate pain. 09/16/20   Copland, Gay Filler, MD  Hyoscyamine Sulfate 0.375 MG TBCR Take 0.375 mg by mouth in the morning and at bedtime.    [provider]  lansoprazole (PREVACID SOLUTAB) 15 MG disintegrating tablet Take 15 mg by mouth 2 (two) times daily before a meal.    [provider]  levothyroxine (SYNTHROID) 75 MCG tablet Take 1 tablet (75 mcg total) by mouth daily before breakfast. 09/27/20   Copland, Gay Filler, MD  MAGNESIUM PO Take 250 mg by mouth daily.    [provider]  meloxicam (MOBIC) 15 MG tablet Take 15 mg by mouth daily as needed for pain (Hip pain).    [provider]  Menthol, Topical Analgesic, (BIOFREEZE COLORLESS) 4 % GEL Apply 1 application topically daily as needed (Hip pain).    [provider]  mesalamine (LIALDA) 1.2 g EC tablet Take 2.4 g by mouth in the morning and at bedtime.     [provider]  metFORMIN (GLUCOPHAGE) 500 MG tablet Take 1 tablet (500 mg total) by mouth 2 (two) times daily with a meal. 06/24/20   End, Harrell Gave, MD  montelukast (SINGULAIR) 10 MG tablet Take 10 mg by mouth at bedtime.     [provider]  Omega 3 1000 MG CAPS Take 2,000 mg by mouth every other day.    [provider]  promethazine (PHENERGAN) 25 MG tablet Take 0.5-1 tablets (12.5-25 mg total) by mouth daily as needed for nausea or vomiting. 04/13/20   Copland, Gay Filler, MD  rosuvastatin (CRESTOR) 20 MG tablet Take 1 tablet (20 mg total) by mouth daily. Patient taking differently: Take 20 mg by mouth at bedtime. 06/18/19   Copland, Gay Filler, MD  spironolactone (ALDACTONE) 25 MG tablet Take 0.5 tablets (12.5 mg total) by mouth daily. 07/30/20 10/28/20  Park Liter, MD  SYMBICORT 160-4.5 MCG/ACT inhaler INHALE 2 PUFFS  INTO THE LUNGS TWICE A DAY Patient taking differently: Inhale 2 puffs into the lungs in the morning and at bedtime. 09/06/20   Saguier, Percell Miller, PA-C  triamcinolone (NASACORT) 55 MCG/ACT AERO nasal inhaler Place 2 sprays into the nose daily.    [provider]  Turmeric 500 MG CAPS Take 500 mg  by mouth daily.    [provider]  venlafaxine XR (EFFEXOR-XR) 150 MG 24 hr capsule Take 1 capsule (150 mg total) by mouth daily with breakfast. 05/10/20   Copland, Gay Filler, MD  zinc gluconate 50 MG tablet Take 50 mg by mouth daily.    [provider]  zolpidem (AMBIEN) 5 MG tablet TAKE 1 TABLET BY MOUTH EVERY DAY AT BEDTIME AS NEEDED FOR SLEEP Patient taking differently: Take 5 mg by mouth at bedtime. 09/09/20   Copland, Gay Filler, MD    Inpatient Medications:   pantoprazole (PROTONIX) IV  40 mg Intravenous QHS   sodium bicarbonate       sodium bicarbonate  50 mEq Intravenous Once      Allergies:  Allergies  Allergen Reactions   Losartan Potassium Shortness Of Breath and Other (See Comments)   Ace Inhibitors Cough   Aspirin Other (See Comments)    Upset stomach   Cefaclor Hives and Other (See Comments)   Oxycodone Hives   Sulfa Antibiotics Hives   Lisinopril Cough and Other (See Comments)    Social History   Socioeconomic History   Marital status: Widowed    Spouse name: Not on file   Number of children: Not on file   Years of education: Not on file   Highest education level: Not on file  Occupational History   Not on file  Tobacco Use   Smoking status: Former    Packs/day: 3.00    Years: 15.00    Pack years: 45.00    Types: Cigarettes    Quit date: 09/14/1993    Years since quitting: 27.0   Smokeless tobacco: Never   Tobacco comments:    Quit 26 yrs ago  Vaping Use   Vaping Use: Never used  Substance and Sexual Activity   Alcohol use: Yes    Comment: Drinks a couple every 2-3 months   Drug use: No   Sexual activity: Not Currently    Comment:  ablation  Other Topics Concern   Not on file  Social History Narrative   Not on file   Social Determinants of Health   Financial Resource Strain: Not on file  Food Insecurity: Not on file  Transportation Needs: Not on file  Physical Activity: Not on file  Stress: Not on file  Social Connections: Not on file  Intimate Partner Violence: Not on file     Family History  Problem Relation Age of Onset   Arthritis Mother 70       Deceased   Breast cancer Mother    Lung cancer Mother    Hyperlipidemia Mother    Heart disease Mother    Hypertension Mother    Diabetes Mother    Crohn's disease Mother    Diabetes Maternal Grandfather    Heart disease Maternal Grandfather    Heart attack Maternal Grandfather    Leukemia Maternal Grandfather    Colitis Sister    Leukemia Cousin 3       mat cousin   Lung cancer Father    Brain cancer Father      ROS:  Please see the history of present illness.     All other systems reviewed and negative.    Physical Exam: Blood pressure (!) 88/70, pulse (!) 125, temperature 97.7 F (36.5 C), temperature source Rectal, resp. rate (!) 22, SpO2 100 %. General: Well developed, well nourished female in significant intermittent pain Head: Normocephalic, atraumatic, sclera non-icteric, no xanthomas, nares are without  discharge. EENT: normal Lymph Nodes:  none Back: without scoliosis/kyphosis, no CVA tendersness Neck: Negative for carotid bruits. JVD not elevated. Lungs: Clear bilaterally to auscultation without wheezes, rales, or rhonchi. Breathing is unlabored. Heart: RRR with S1 S2-distant. No murmur , rubs, or gallops appreciated. Abdomen: Soft, non-tender, non-distended with normoactive bowel sounds. No hepatomegaly. No rebound/guarding. No obvious abdominal masses. Msk:  Strength and tone appear normal for age. Pocket with significant hematoma- fullness over pocketswelling or tenderness Extremities: No clubbing or cyanosis. No  edema.  Distal  pedal pulses are 2+ and equal bilaterally. Skin: Warm and Dry Neuro: Alert and oriented X 3. CN III-XII intact Grossly normal sensory and motor function . Psych:  Responds to questions appropriately with a normal affect.      Labs: Cardiac Enzymes No results for input(s): CKTOTAL, CKMB, TROPONINI in the last 72 hours. CBC Lab Results  Component Value Date   WBC 25.8 (H) 10/02/2020   HGB 11.9 (L) 10/02/2020   HCT 35.0 (L) 10/02/2020   MCV 91.9 10/02/2020   PLT 498 (H) 10/02/2020   PROTIME: No results for input(s): LABPROT, INR in the last 72 hours. Chemistry  Recent Labs  Lab 10/02/20 1139 10/02/20 1149  NA 130* 129*  K 4.0 4.0  CL 95* 95*  CO2 23  --   BUN 5* 3*  CREATININE 0.81 0.60  CALCIUM 9.4  --   PROT 6.5  --   BILITOT 0.9  --   ALKPHOS 58  --   ALT 14  --   AST 15  --   GLUCOSE 328* 319*   Lipids Lab Results  Component Value Date   CHOL 128 08/10/2020   HDL 37 (L) 08/10/2020   LDLCALC 65 08/10/2020   TRIG 153 (H) 08/10/2020   BNP No results found for: PROBNP Thyroid Function Tests: No results for input(s): TSH, T4TOTAL, T3FREE, THYROIDAB in the last 72 hours.  Invalid input(s): FREET3    Miscellaneous No results found for: DDIMER  Radiology/Studies:  DG Chest 2 View  Result Date: 10/01/2020 CLINICAL DATA:  Status post defibrillator placement EXAM: CHEST - 2 VIEW COMPARISON:  06/15/2020 FINDINGS: Defibrillator is now noted in satisfactory position. Cardiac shadow is stable. Aortic calcifications are noted. No pneumothorax is seen. The lungs are clear. Postoperative changes in the right breast are again seen. IMPRESSION: No evidence of post placement pneumothorax. No acute abnormality noted. Electronically Signed   By: Inez Catalina M.D.   On: 10/01/2020 17:59   EP PPM/ICD IMPLANT  Result Date: 10/01/2020  SURGEON:  Allegra Lai, MD    PREPROCEDURE DIAGNOSES:  1. Nonichemic cardiomyopathy.  2. New York Heart Association class III, heart failure  chronically.    POSTPROCEDURE DIAGNOSES:  1. Nonischemic cardiomyopathy.  2. New York Heart Association class III heart failure chronically.    PROCEDURES:   1. ICD implantation.  2. Left upper extremity venography  3. Defibrillation threshold testing    INTRODUCTION: Erin Fields is a 55 y.o. female with a nonischemic CM (EF25%), NYHA Class III CHF, and CAD. At this time, she meets MADIT II/ SCD-HeFT criteria for ICD implantation for primary prevention of sudden death.  The patient has a narrow QRS and does not meet criteria for revascularization.  The patient has been treated with an optimal medical regimen but continues to have a depressed ejection fraction and NYHA Class II CHF symptoms.  The patient therefore  presents today for ICD implantation.    DESCRIPTION OF PROCEDURE:  Informed  written consent was obtained and the patient was brought to the electrophysiology lab in the fasting state. The patient was adequately sedated with intravenous Versed, and fentanyl as outlined in the nursing report.  The patient's left chest was prepped and draped in the usual sterile fashion by the EP lab staff.  The skin overlying the left deltopectoral region was infiltrated with lidocaine for local analgesia.  A 5-cm incision was made over the left deltopectoral region.  A left subcutaneous defibrillator pocket was fashioned using a combination of sharp and blunt dissection.  Electrocautery was used to assure hemostasis. Left Upper extremity Venography:  A venogram of the left upper extremity was performed which revealed a moderate sized left axillary vein which emptied into a moderate sized left subclavian vein.  RV Lead Placement: The left axillary vein was cannulated with fluoroscopic visualization.  No contrast was required for this endeavor.  Through the left axillary vein, a [employer] [place], model 7122Q-65 (serial number [id number]) right ventricular defibrillator lead was advanced with fluoroscopic visualization  into the right ventricular apex.  Initial right ventricular lead R-wave measured 11.4 mV with impedance of 822 ohms and a threshold of 0.8 volts at 0.5 milliseconds. The leads were secured to the pectoralis  fascia using #2 silk suture over the suture sleeves.  The pocket then  irrigated with copious gentamicin solution.  The leads were then  connected to an [place] (serial  Number [id number]) ICD.  The defibrillator was placed into the  pocket.  The pocket was then closed in 3 layers with 2.0 Vicryl suture  for the subcutaneous and 3.0 Vicryl suture subcuticular layers.  EBL<22m. Steri-Strips and a  sterile dressing were then applied.    CONCLUSIONS:  1. Ischemic cardiomyopathy with chronic New York Heart Association class II heart failure.  2. Successful ICD implantation.  3. DFT less than or equal to 15 joules.  4. No inducible VT or VF with PES  5. No early apparent complications.   DG Chest Port 1 View  Result Date: 10/02/2020 CLINICAL DATA:  Weakness EXAM: PORTABLE CHEST 1 VIEW COMPARISON:  10/01/2020 FINDINGS: Single lead left-sided implanted cardiac device. The position of the distal lead is now more superiorly oriented compared to the recent previous radiograph. Difficult to determine the exact location given lack of orthogonal view. Heart size within normal limits. Interval development of a large left-sided pleural effusion. Left basilar opacity, likely compressive atelectasis. Right lung remains clear. No pneumothorax is seen. IMPRESSION: 1. Interval development of large left-sided pleural effusion. 2. The position of the different later lead is now more superiorly oriented compared to the recent previous radiograph. Difficult to determine the exact location given lack of orthogonal view. Consider follow-up with two view chest x-ray or CT of the chest for further evaluation. These results were discussed by telephone at the time of interpretation on 10/02/2020 at 12:35 pm with  provider Dr KCaryl Comes who verbally acknowledged these results. Electronically Signed   By: NDavina PokeD.O.   On: 10/02/2020 12:35   DG Chest Port 1 View  Result Date: 10/01/2020 CLINICAL DATA:  Chest pain EXAM: PORTABLE CHEST 1 VIEW COMPARISON:  10/01/2020, 06/15/2020 FINDINGS: Left-sided single lead pacing device with tip projecting over cardiac apex. Cardiac size upper limits of normal. No focal opacity, pleural effusion, or pneumothorax. IMPRESSION: Similar appearance of left-sided pacing device allowing for patient rotation. Lung fields are clear. Electronically Signed   By: KDonavan FoilM.D.   On: 10/01/2020  21:02    EKG:    Assessment and Plan:  Right ventricular perforation with extension into the pleural space; hemodynamic collapse and shock  Nonischemic cardiomyopathy  ICD-acutely paced St Jude     The patient presents with shock and pain.  Chest x-ray demonstrates interval opacification of the left lung since yesterday p.m.  Echocardiogram last night and again today shows only small pericardial fluid and a small RV.  The CT scan confirmed the clinical hypothesis of heart RV lead perforation and to be left chest and secondary hemothorax contributing to shock.  Discussions with Dr. Roxy Manns and radiology and Dr. Chase Caller.  Patient to the OR for emergent cystoscopy removal and closure of the perforation.    Discussed with her son   [id number] plus [phone] spent with critically ill patient in shock, managing consulting         Virl Axe

## 2020-10-02 NOTE — ED Provider Notes (Signed)
[facility] DEPT Provider Note   CSN: [id number] Arrival date & time: 10/02/20  1128     History Chief Complaint  Patient presents with   Chest Pain    Tachycardia, diaphoresis    Erin Fields is a 55 y.o. female.  Patient complains of chest pain and weakness.  She presents to the emergency department hypotensive and severely tachycardic.  She had a defibrillator placed yesterday and is complaining of some pain over that area.  The history is provided by the patient and medical records. No language interpreter was used.  Chest Pain Pain location:  L chest Pain quality: aching   Pain radiates to:  Does not radiate Pain severity:  Moderate Onset quality:  Sudden Duration:  4 hours Timing:  Constant Chronicity:  New Context: not breathing   Associated symptoms: no abdominal pain, no back pain, no cough, no fatigue and no headache       Past Medical History:  Diagnosis Date   Allergic rhinitis due to animal (cat) (dog) hair and dander 04/27/2020   Allergic rhinitis due to pollen 04/27/2020   Anxiety    Anxiety and depression 01/20/2018   Asthma    Asthma, persistent controlled 10/05/2018   B12 deficiency 11/27/2019   Borderline diabetes 12/17/2015   Cancer ([place]) 2019   right breast cancer   CERVICAL CANCER, HX OF 12/06/2006   Qualifier: Diagnosis of  By: Arnoldo Morale MD, John E    Chronic sinusitis 01/26/2015   Chronic venous insufficiency 09/02/2015   Colon polyps    Congestive heart failure ([place]) 04/29/2020   Controlled type 2 diabetes mellitus without complication, without long-term current use of insulin ([place]) 05/28/2017   Coronary artery disease 25% of RCA, 20% intermediate branch, 40% diagonal branch based on cardiac cath from 2022 07/22/2020   Crohn disease ([place]) 11/29/2016   Depression    Diabetes mellitus without complication ([place])    type 2   Dilated cardiomyopathy ([place]) ejection fraction 25% by echocardiogram from January 2022 04/29/2020    Dyspnea 09/15/2014   Elevated triglycerides with high cholesterol 11/26/2013   Environmental allergies    Eustachian tube dysfunction 12/04/2014   Ex-smoker 11/18/2015   Family history of breast cancer    Genetic testing 07/16/2017   Negative genetic testing on the multi-cancer panel.  The Multi-Gene Panel offered by [name] includes sequencing and/or deletion duplication testing of the following 83 genes: ALK, APC, ATM, AXIN2,BAP1,  BARD1, BLM, BMPR1A, BRCA1, BRCA2, BRIP1, CASR, CDC73, CDH1, CDK4, CDKN1B, CDKN1C, CDKN2A (p14ARF), CDKN2A (p16INK4a), CEBPA, CHEK2, CTNNA1, DICER1, DIS3L2, EGFR (c.2369C>T, p.Thr790Met variant onl   GERD (gastroesophageal reflux disease)    HEADACHE 12/06/2006   Qualifier: Diagnosis of  By: Arnoldo Morale MD, John E    History of adenomatous polyp of colon 11/19/2017   Hyperlipidemia    Hypertension    Hypothyroidism    IDA (iron deficiency anemia) 11/06/2019   Impaired fasting glucose 12/12/2017   Insomnia 08/20/2013   Irritable bowel syndrome 11/29/2006   Qualifier: Diagnosis of  By: Arnoldo Morale MD, John E    Left sided abdominal pain 12/13/2017   Liver hemangioma 08/24/2014   Malignant neoplasm of upper-outer quadrant of right breast in female, estrogen receptor positive ([place]) 03/20/2017   Migraines    Mild persistent asthma, uncomplicated 12/02/7114   Nasal polyp, unspecified 01/10/2018   Oral herpes 04/20/2013   Other allergic rhinitis 09/15/2014   Personal history of radiation therapy 2019   36 radiation tx   PONV (postoperative nausea  and vomiting)    scopolamine patch helps   Recurrent infections 10/05/2018   Right upper lobe pulmonary nodule 01/10/2018   Symptomatic varicose veins, right 09/02/2015   Ulcerative colitis ([place])    Varicose veins     Patient Active Problem List   Diagnosis Date Noted   Anxiety    Coronary artery disease 25% of RCA, 20% intermediate branch, 40% diagonal branch based on cardiac cath from 2022 07/22/2020   Dilated cardiomyopathy ([place]) ejection  fraction 25% by echocardiogram from January 2022 04/29/2020   Congestive heart failure ([place]) 04/29/2020   Allergic rhinitis due to animal (cat) (dog) hair and dander 04/27/2020   Allergic rhinitis due to pollen 04/27/2020   Mild persistent asthma, uncomplicated 23/55/7322   PONV (postoperative nausea and vomiting)    Migraines    GERD (gastroesophageal reflux disease)    Environmental allergies    Diabetes mellitus without complication ([place])    Depression    Colon polyps    B12 deficiency 11/27/2019   IDA (iron deficiency anemia) 11/06/2019   Asthma, persistent controlled 10/05/2018   Recurrent infections 10/05/2018   Anxiety and depression 01/20/2018   Right upper lobe pulmonary nodule 01/10/2018   Nasal polyp, unspecified 01/10/2018   Left sided abdominal pain 12/13/2017   Impaired fasting glucose 12/12/2017   History of adenomatous polyp of colon 11/19/2017   Genetic testing 07/16/2017   Family history of breast cancer    Controlled type 2 diabetes mellitus without complication, without long-term current use of insulin ([place]) 05/28/2017   Personal history of radiation therapy 2019   Cancer ([place]) 2019   Malignant neoplasm of upper-outer quadrant of right breast in female, estrogen receptor positive ([place]) 03/20/2017   Crohn disease ([place]) 11/29/2016   Borderline diabetes 12/17/2015   Ex-smoker 11/18/2015   Chronic venous insufficiency 09/02/2015   Symptomatic varicose veins, right 09/02/2015   Chronic sinusitis 01/26/2015   Eustachian tube dysfunction 12/04/2014   Other allergic rhinitis 09/15/2014   Dyspnea 09/15/2014   Liver hemangioma 08/24/2014   Elevated triglycerides with high cholesterol 11/26/2013   Insomnia 08/20/2013   Hypertension 08/20/2013   Ulcerative colitis ([place]) 04/20/2013   Oral herpes 04/20/2013   Hyperlipidemia 12/06/2006   HEADACHE 12/06/2006   CERVICAL CANCER, HX OF 12/06/2006   Hypothyroidism 11/29/2006   Asthma 11/29/2006   IRRITABLE BOWEL  SYNDROME 11/29/2006    Past Surgical History:  Procedure Laterality Date   BREAST BIOPSY Right 02/2017   BREAST BIOPSY Right 11/07/2019   BREAST LUMPECTOMY Right 04/24/2017   BREAST LUMPECTOMY WITH RADIOACTIVE SEED AND SENTINEL LYMPH NODE BIOPSY Right 04/24/2017   Procedure: RIGHT BREAST LUMPECTOMY WITH RADIOACTIVE SEED AND SENTINEL LYMPH NODE BIOPSY;  Surgeon: [name], MD;  Location: [facility];  Service: General;  Laterality: Right;   crapal tunnel relase Left 05/2018   ENDOMETRIAL ABLATION  2010   Had abnormal uterine bleeding (heavy and frequent), performed in [place], menses stopped completely 2 years after   EXPLORATORY LAPAROTOMY     Had exploratory surgery at the age of 56 for abdominal pain with cyst found on her left ovary, not treated surgically   FOOT SURGERY Right 2012   HAND SURGERY Right    RE-EXCISION OF BREAST LUMPECTOMY Right 05/15/2017   Procedure: RE-EXCISION OF BREAST LUMPECTOMY;  Surgeon: [name], MD;  Location: [facility];  Service: General;  Laterality: Right;   RIGHT/LEFT HEART CATH AND CORONARY ANGIOGRAPHY N/A 06/21/2020   Procedure: RIGHT/LEFT HEART CATH AND CORONARY ANGIOGRAPHY;  Surgeon: Nelva Bush, MD;  Location: [facility];  Service: Cardiovascular;  Laterality: N/A;   ROBOTIC ASSISTED SALPINGO OOPHERECTOMY Bilateral 08/29/2019   Procedure: XI ROBOTIC ASSISTED SALPINGO OOPHORECTOMY;  Surgeon: [name], MD;  Location: [facility];  Service: Gynecology;  Laterality: Bilateral;   Septoplasty with turbinate reduction     TONSILLECTOMY  1980   VEIN SURGERY     Removal Right Leg   WISDOM TOOTH EXTRACTION       OB History     Gravida  3   Para  2   Term      Preterm      AB  1   Living  1      SAB      IAB      Ectopic      Multiple      Live Births              Family History  Problem Relation Age of Onset   Arthritis Mother 82       Deceased   Breast  cancer Mother    Lung cancer Mother    Hyperlipidemia Mother    Heart disease Mother    Hypertension Mother    Diabetes Mother    Crohn's disease Mother    Diabetes Maternal Grandfather    Heart disease Maternal Grandfather    Heart attack Maternal Grandfather    Leukemia Maternal Grandfather    Colitis Sister    Leukemia Cousin 3       mat cousin   Lung cancer Father    Brain cancer Father     Social History   Tobacco Use   Smoking status: Former    Packs/day: 3.00    Years: 15.00    Pack years: 45.00    Types: Cigarettes    Quit date: 09/14/1993    Years since quitting: 27.0   Smokeless tobacco: Never   Tobacco comments:    Quit 26 yrs ago  Vaping Use   Vaping Use: Never used  Substance Use Topics   Alcohol use: Yes    Comment: Drinks a couple every 2-3 months   Drug use: No    Home Medications Prior to Admission medications   Medication Sig Start Date End Date Taking? Authorizing Provider  acyclovir (ZOVIRAX) 400 MG tablet TAKE 1 TABLET (400 MG TOTAL) BY MOUTH 3 (THREE) TIMES DAILY AS NEEDED. Patient taking differently: Take 400 mg by mouth 3 (three) times daily as needed (outbreaks). 09/24/19   Copland, Gay Filler, MD  Adalimumab 80 MG/0.8ML PNKT Inject 80 mg into the skin every 14 (fourteen) days. 12/01/19   [provider]  albuterol (VENTOLIN HFA) 108 (90 Base) MCG/ACT inhaler Inhale 2 puffs into the lungs every 6 (six) hours as needed for shortness of breath. 09/22/20   [provider]  ascorbic acid (VITAMIN C) 1000 MG tablet Take 1,000 mg by mouth daily.    [provider]  aspirin EC 81 MG tablet Take 81 mg by mouth daily. Swallow whole.    [provider]  Azelastine HCl 0.15 % SOLN Place 2 sprays into both nostrils 2 (two) times daily. For 30 days 07/16/20   [provider]  Biotin 1000 MCG tablet Take 1,000 mcg by mouth daily.    [provider]  Calcium Carb-Cholecalciferol (CALCIUM+D3) 600-800 MG-UNIT TABS  Take 1 tablet by mouth daily.    [provider]  carvedilol (COREG) 12.5 MG tablet  Take 1 tablet (12.5 mg total) by mouth 2 (two) times daily. 08/24/20   Park Liter, MD  cetirizine (ZYRTEC) 10 MG tablet Take 10 mg by mouth daily.    [provider]  cyclobenzaprine (FLEXERIL) 5 MG tablet Take 1 tablet (5 mg total) by mouth 3 (three) times daily as needed for up to 7 days for muscle spasms. 10/01/20 10/08/20  Horton, Alvin Critchley, DO  diphenoxylate-atropine (LOMOTIL) 2.5-0.025 MG tablet Take 2 tablets by mouth 4 (four) times daily as needed for diarrhea or loose stools.    [provider]  doxycycline (VIBRAMYCIN) 100 MG capsule Take 1 capsule (100 mg total) by mouth 2 (two) times daily. 09/27/20   Copland, Gay Filler, MD  ENTRESTO 49-51 MG TAKE 1 TABLET BY MOUTH TWICE A DAY Patient taking differently: Take 1 tablet by mouth 2 (two) times daily. 09/06/20   Park Liter, MD  EPINEPHrine 0.3 mg/0.3 mL IJ SOAJ injection Inject 0.3 mg into the muscle as needed for anaphylaxis.    [provider]  exemestane (AROMASIN) 25 MG tablet Take 25 mg by mouth daily after breakfast.    [provider]  Glucosamine-Chondroit-Vit C-Mn (GLUCOSAMINE-CHONDROITIN) CAPS Take 1 capsule by mouth daily.    [provider]  HYDROcodone-acetaminophen (NORCO/VICODIN) 5-325 MG tablet Take 1 tablet by mouth every 6 (six) hours as needed for up to 20 doses for severe pain or moderate pain. 09/16/20   Copland, Gay Filler, MD  Hyoscyamine Sulfate 0.375 MG TBCR Take 0.375 mg by mouth in the morning and at bedtime.    [provider]  lansoprazole (PREVACID SOLUTAB) 15 MG disintegrating tablet Take 15 mg by mouth 2 (two) times daily before a meal.    [provider]  levothyroxine (SYNTHROID) 75 MCG tablet Take 1 tablet (75 mcg total) by mouth daily before breakfast. 09/27/20   Copland, Gay Filler, MD  MAGNESIUM PO Take 250 mg by mouth daily.    [provider]  meloxicam (MOBIC) 15 MG tablet Take 15 mg by mouth daily as needed for pain (Hip pain).    [provider]  Menthol, Topical Analgesic, (BIOFREEZE COLORLESS) 4 % GEL Apply 1 application topically daily as needed (Hip pain).    [provider]  mesalamine (LIALDA) 1.2 g EC tablet Take 2.4 g by mouth in the morning and at bedtime.     [provider]  metFORMIN (GLUCOPHAGE) 500 MG tablet Take 1 tablet (500 mg total) by mouth 2 (two) times daily with a meal. 06/24/20   End, Harrell Gave, MD  montelukast (SINGULAIR) 10 MG tablet Take 10 mg by mouth at bedtime.     [provider]  Omega 3 1000 MG CAPS Take 2,000 mg by mouth every other day.    [provider]  promethazine (PHENERGAN) 25 MG tablet Take 0.5-1 tablets (12.5-25 mg total) by mouth daily as needed for nausea or vomiting. 04/13/20   Copland, Gay Filler, MD  rosuvastatin (CRESTOR) 20 MG tablet Take 1 tablet (20 mg total) by mouth daily. Patient taking differently: Take 20 mg by mouth at bedtime. 06/18/19   Copland, Gay Filler, MD  spironolactone (ALDACTONE) 25 MG tablet Take 0.5 tablets (12.5 mg total) by mouth daily. 07/30/20 10/28/20  Park Liter, MD  SYMBICORT 160-4.5 MCG/ACT inhaler INHALE 2 PUFFS INTO THE LUNGS TWICE A DAY Patient taking differently: Inhale 2 puffs into the lungs in the morning and at bedtime. 09/06/20   Saguier, Percell Miller, PA-C  triamcinolone (  NASACORT) 55 MCG/ACT AERO nasal inhaler Place 2 sprays into the nose daily.    [provider]  Turmeric 500 MG CAPS Take 500 mg by mouth daily.    [provider]  venlafaxine XR (EFFEXOR-XR) 150 MG 24 hr capsule Take 1 capsule (150 mg total) by mouth daily with breakfast. 05/10/20   Copland, Gay Filler, MD  zinc gluconate 50 MG tablet Take 50 mg by mouth daily.    [provider]  zolpidem (AMBIEN) 5 MG tablet TAKE 1 TABLET BY MOUTH EVERY DAY AT BEDTIME AS NEEDED FOR SLEEP Patient taking  differently: Take 5 mg by mouth at bedtime. 09/09/20   Copland, Gay Filler, MD    Allergies    Losartan potassium, Ace inhibitors, Aspirin, Cefaclor, Oxycodone, Sulfa antibiotics, and Lisinopril  Review of Systems   Review of Systems  Constitutional:  Negative for appetite change and fatigue.  HENT:  Negative for congestion, ear discharge and sinus pressure.   Eyes:  Negative for discharge.  Respiratory:  Negative for cough.   Cardiovascular:  Positive for chest pain.  Gastrointestinal:  Negative for abdominal pain and diarrhea.  Genitourinary:  Negative for frequency and hematuria.  Musculoskeletal:  Negative for back pain.  Skin:  Negative for rash.  Neurological:  Negative for seizures and headaches.  Psychiatric/Behavioral:  Negative for hallucinations.    Physical Exam Updated Vital Signs BP (!) 88/70 (BP Location: Right Arm)   Pulse (!) 125   Temp 97.7 F (36.5 C) (Rectal)   Resp (!) 22   SpO2 100%   Physical Exam Vitals reviewed.  Constitutional:      Appearance: She is well-developed.  HENT:     Head: Normocephalic.     Nose: Nose normal.     Mouth/Throat:     Mouth: Mucous membranes are moist.  Eyes:     General: No scleral icterus.    Conjunctiva/sclera: Conjunctivae normal.  Neck:     Thyroid: No thyromegaly.  Cardiovascular:     Rate and Rhythm: Regular rhythm. Tachycardia present.     Chest Wall: PMI is not displaced.     Heart sounds: No murmur heard.   No friction rub. No gallop.  Pulmonary:     Breath sounds: No stridor. No wheezing or rales.     Comments: Significant bruising and tenderness to left-sided chest where she had a pacemaker placed Chest:     Chest wall: Tenderness present.  Abdominal:     General: There is no distension.     Tenderness: There is no abdominal tenderness. There is no rebound.  Musculoskeletal:        General: Normal range of motion.     Cervical back: Neck supple.  Lymphadenopathy:     Cervical: No cervical  adenopathy.  Skin:    Findings: No erythema or rash.  Neurological:     Mental Status: She is alert and oriented to person, place, and time.     Motor: No abnormal muscle tone.     Coordination: Coordination normal.  Psychiatric:        Behavior: Behavior normal.    ED Results / Procedures / Treatments   Labs (all labs ordered are listed, but only abnormal results are displayed) Labs Reviewed  CBC WITH DIFFERENTIAL/PLATELET - Abnormal; Notable for the following components:      Result Value   WBC 25.8 (*)    RBC 3.82 (*)    Hemoglobin 11.6 (*)    HCT 35.1 (*)  Platelets 498 (*)    Neutro Abs 16.1 (*)    Lymphs Abs 6.4 (*)    Monocytes Absolute 2.6 (*)    Abs Immature Granulocytes 0.20 (*)    All other components within normal limits  COMPREHENSIVE METABOLIC PANEL - Abnormal; Notable for the following components:   Sodium 130 (*)    Chloride 95 (*)    Glucose, Bld 328 (*)    BUN 5 (*)    All other components within normal limits  I-STAT CHEM 8, ED - Abnormal; Notable for the following components:   Sodium 129 (*)    Chloride 95 (*)    BUN 3 (*)    Glucose, Bld 319 (*)    Hemoglobin 11.9 (*)    HCT 35.0 (*)    All other components within normal limits  TROPONIN I (HIGH SENSITIVITY) - Abnormal; Notable for the following components:   Troponin I (High Sensitivity) 121 (*)    All other components within normal limits  TYPE AND SCREEN    EKG None  Radiology DG Chest 2 View  Result Date: 10/01/2020 CLINICAL DATA:  Status post defibrillator placement EXAM: CHEST - 2 VIEW COMPARISON:  06/15/2020 FINDINGS: Defibrillator is now noted in satisfactory position. Cardiac shadow is stable. Aortic calcifications are noted. No pneumothorax is seen. The lungs are clear. Postoperative changes in the right breast are again seen. IMPRESSION: No evidence of post placement pneumothorax. No acute abnormality noted. Electronically Signed   By: Inez Catalina M.D.   On: 10/01/2020 17:59    EP PPM/ICD IMPLANT  Result Date: 10/01/2020  SURGEON:  Allegra Lai, MD    PREPROCEDURE DIAGNOSES:  1. Nonichemic cardiomyopathy.  2. New York Heart Association class III, heart failure chronically.    POSTPROCEDURE DIAGNOSES:  1. Nonischemic cardiomyopathy.  2. New York Heart Association class III heart failure chronically.    PROCEDURES:   1. ICD implantation.  2. Left upper extremity venography  3. Defibrillation threshold testing    INTRODUCTION: Erin Fields is a 55 y.o. female with a nonischemic CM (EF25%), NYHA Class III CHF, and CAD. At this time, she meets MADIT II/ SCD-HeFT criteria for ICD implantation for primary prevention of sudden death.  The patient has a narrow QRS and does not meet criteria for revascularization.  The patient has been treated with an optimal medical regimen but continues to have a depressed ejection fraction and NYHA Class II CHF symptoms.  The patient therefore  presents today for ICD implantation.    DESCRIPTION OF PROCEDURE:  Informed written consent was obtained and the patient was brought to the electrophysiology lab in the fasting state. The patient was adequately sedated with intravenous Versed, and fentanyl as outlined in the nursing report.  The patient's left chest was prepped and draped in the usual sterile fashion by the EP lab staff.  The skin overlying the left deltopectoral region was infiltrated with lidocaine for local analgesia.  A 5-cm incision was made over the left deltopectoral region.  A left subcutaneous defibrillator pocket was fashioned using a combination of sharp and blunt dissection.  Electrocautery was used to assure hemostasis. Left Upper extremity Venography:  A venogram of the left upper extremity was performed which revealed a moderate sized left axillary vein which emptied into a moderate sized left subclavian vein.  RV Lead Placement: The left axillary vein was cannulated with fluoroscopic visualization.  No contrast was required for  this endeavor.  Through the left axillary vein, a [employer], model [id number] (serial number [id number]) right ventricular defibrillator lead was advanced with fluoroscopic visualization into the right ventricular apex.  Initial right ventricular lead R-wave measured 11.4 mV with impedance of 822 ohms and a threshold of 0.8 volts at 0.5 milliseconds. The leads were secured to the pectoralis  fascia using #2 silk suture over the suture sleeves.  The pocket then  irrigated with copious gentamicin solution.  The leads were then  connected to an [place] (serial  Number [id number]) ICD.  The defibrillator was placed into the  pocket.  The pocket was then closed in 3 layers with 2.0 Vicryl suture  for the subcutaneous and 3.0 Vicryl suture subcuticular layers.  EBL<23m. Steri-Strips and a  sterile dressing were then applied.    CONCLUSIONS:  1. Ischemic cardiomyopathy with chronic New York Heart Association class II heart failure.  2. Successful ICD implantation.  3. DFT less than or equal to 15 joules.  4. No inducible VT or VF with PES  5. No early apparent complications.   DG Chest Port 1 View  Result Date: 10/02/2020 CLINICAL DATA:  Weakness EXAM: PORTABLE CHEST 1 VIEW COMPARISON:  10/01/2020 FINDINGS: Single lead left-sided implanted cardiac device. The position of the distal lead is now more superiorly oriented compared to the recent previous radiograph. Difficult to determine the exact location given lack of orthogonal view. Heart size within normal limits. Interval development of a large left-sided pleural effusion. Left basilar opacity, likely compressive atelectasis. Right lung remains clear. No pneumothorax is seen. IMPRESSION: 1. Interval development of large left-sided pleural effusion. 2. The position of the different later lead is now more superiorly oriented compared to the recent previous radiograph. Difficult to determine the exact location given lack of orthogonal view.  Consider follow-up with two view chest x-ray or CT of the chest for further evaluation. These results were discussed by telephone at the time of interpretation on 10/02/2020 at 12:35 pm with provider Dr KCaryl Comes who verbally acknowledged these results. Electronically Signed   By: NDavina PokeD.O.   On: 10/02/2020 12:35   DG Chest Port 1 View  Result Date: 10/01/2020 CLINICAL DATA:  Chest pain EXAM: PORTABLE CHEST 1 VIEW COMPARISON:  10/01/2020, 06/15/2020 FINDINGS: Left-sided single lead pacing device with tip projecting over cardiac apex. Cardiac size upper limits of normal. No focal opacity, pleural effusion, or pneumothorax. IMPRESSION: Similar appearance of left-sided pacing device allowing for patient rotation. Lung fields are clear. Electronically Signed   By: KDonavan FoilM.D.   On: 10/01/2020 21:02    Procedures Procedures   Medications Ordered in ED Medications  0.9 %  sodium chloride infusion (has no administration in time range)  norepinephrine (LEVOPHED) 420min 25032mremix infusion (has no administration in time range)  sodium chloride 0.9 % bolus 2,000 mL (1,500 mLs Intravenous New Bag/Given 10/02/20 1159)    ED Course  I have reviewed the triage vital signs and the nursing notes.  Pertinent labs & imaging results that were available during my care of the patient were reviewed by me and considered in my medical decision making (see chart for details).   CRITICAL CARE Performed by: JosMilton Fergusontal critical care time:60 minutes Critical care time was exclusive of separately billable procedures and treating other patients. Critical care was necessary to treat or prevent imminent or life-threatening deterioration. Critical care was time spent personally by me on the following activities: development of treatment plan with patient and/or surrogate as well as  nursing, discussions with consultants, evaluation of patient's response to treatment, examination of patient, obtaining  history from patient or surrogate, ordering and performing treatments and interventions, ordering and review of laboratory studies, ordering and review of radiographic studies, pulse oximetry and re-evaluation of patient's condition. Patient presented hypotensive and tachycardic after recently having a defibrillator placed.  She was fluid resuscitated and I spoke with cardiology who stated they would come see the patient and get an echo they also wanted critical care involved.  Talk with critical care and they came down to help manage the patient.  Patient was resuscitated with 2 L of saline and she was still hypotensive in the upper 80s.  She was started on Levophed and it was determined by the cardiologist that the patient could be bleeding from the insertion of the site into her chest.  He called the vascular surgeon and the vascular surgeon wanted the patient immediately transferred over to [facility].  Patient is getting a CT scan of the chest and then being transferred.  Dr. Laverta Baltimore is excepting ED doctor. MDM Rules/Calculators/A&P                         Patient with hypotension and tachycardia probably from bleeding into her left chest.  She is going to be transferred immediately to [facility] for the vascular surgeon to see her Final Clinical Impression(s) / ED Diagnoses Final diagnoses:  None    Rx / DC Orders ED Discharge Orders     None        Milton Ferguson, MD 10/02/20 1304

## 2020-10-02 NOTE — ED Notes (Signed)
Per Dr. Roderic Palau, Critical Care hospitalist to place chest tube. Per Cardiology, pt to be transferred to [facility].

## 2020-10-02 NOTE — ED Provider Notes (Addendum)
Blood pressure (!) 88/70, pulse (!) 125, temperature 97.7 F (36.5 C), temperature source Rectal, resp. rate (!) 22, SpO2 100 %.   In short, Erin Fields is a 55 y.o. female with a chief complaint of Chest Pain (Tachycardia, diaphoresis) .  Refer to the original H&P for additional details.  01:35 PM  Patient arrives from the [facility] emergency department as an emergent transfer for cardiothoracic surgery evaluation and operative intervention.  Dr. Roxy Manns immediately at bedside along with cardiology.  Plan is to take the patient immediately to the OR. Patient is awake and able to engage in conversation and follow commands.    Margette Fast, MD 10/02/20 1338    Margette Fast, MD 10/02/20 [phone]

## 2020-10-02 NOTE — ED Notes (Signed)
ED Provider Dr. Roderic Palau notified pt's cardiac rhythm on monitor goes in and out of ventricular bigeminy back into sinus tach. HR remains in the 120's. Pt intermittently c/o chest pain. A&O x4. 500cc NS bolus complete at this time. BP 93/69 at this time.

## 2020-10-02 NOTE — ED Triage Notes (Signed)
Pt presents to the ED via EMS from home for what the pt described as "muscle spasms" in the left upper ribs. Per EMS pt had defib placed yesterday. Pt has a hx of CHF. On ED arrival pt is pale, cool, diaphoretic, however A&O x4.  EMS had established 22g IV in the left hand with 100 cc's NS given en route. Per EMS, HR went from 110 to 150 en transport.

## 2020-10-02 NOTE — H&P (Signed)
NAME:  Erin Fields, MRN:  [id number], DOB:  12-02-1965, LOS: 0 ADMISSION DATE:  10/02/2020, CONSULTATION DATE:  10/02/2020  REFERRING MD:  DR Roderic Palau of ER, CHIEF COMPLAINT:  Left Pleural effusion concern for post procedure hemothorax   History of Present Illness:   Erin Fields is a 55 year old female with past medical history of nonobstructive coronary artery disease dilated cardiomyopathy with ejection fraction 25% in April 2022.  On 10/01/2020 she status post SJM single-chamber ICD.  Post discharge from the procedure suite when she got home she felt severe chest pain and represented to the emergency department where chest x-ray was clear.  Cardiology interrogated the device and showed normal sensing and capture.  Bedside point-of-care ultrasound did not show any pericardial effusion.  The lead placement looked unchanged.  Clinical suspicion was musculoskeletal pain following device placement.  She was discharged home on Norco and muscle relaxant.  She then represented to [facility] emergency department 10/02/2020 with ongoing chest pain found to be pale, cool diaphoretic, requiring oxygen 2 L for 88% and later facemask.  Subsequent tachycardia and soft blood pressure.  Chest x-ray with new onset of moderate size left pleural effusion very concerning for hemothorax.  Emergent CT chest [formal read pending] suggest perforation of the defibrillator lead to the pericardium/RV.  Point-of-care ultrasound by Dr. Adam Phenix did not show any pericardial effusion.  Patient is being transferred to [facility] to heart cardiac ICU.  Emergent surgery is being considered.  CCM primary attending to facilitate transfer and admission process.  Per Dr. Caryl Comes CVTS aware.  Pertinent  Medical History    has a past medical history of Allergic rhinitis due to animal (cat) (dog) hair and dander (04/27/2020), Allergic rhinitis due to pollen (04/27/2020), Anxiety, Anxiety and depression (01/20/2018), Asthma, Asthma,  persistent controlled (10/05/2018), B12 deficiency (11/27/2019), Borderline diabetes (12/17/2015), Cancer ([place]) (2019), CERVICAL CANCER, HX OF (12/06/2006), Chronic sinusitis (01/26/2015), Chronic venous insufficiency (09/02/2015), Colon polyps, Congestive heart failure ([place]) (04/29/2020), Controlled type 2 diabetes mellitus without complication, without long-term current use of insulin ([place]) (05/28/2017), Coronary artery disease 25% of RCA, 20% intermediate branch, 40% diagonal branch based on cardiac cath from 2022 (07/22/2020), Crohn disease ([place]) (11/29/2016), Depression, Diabetes mellitus without complication ([place]), Dilated cardiomyopathy ([place]) ejection fraction 25% by echocardiogram from January 2022 (04/29/2020), Dyspnea (09/15/2014), Elevated triglycerides with high cholesterol (11/26/2013), Environmental allergies, Eustachian tube dysfunction (12/04/2014), Ex-smoker (11/18/2015), Family history of breast cancer, Genetic testing (07/16/2017), GERD (gastroesophageal reflux disease), HEADACHE (12/06/2006), History of adenomatous polyp of colon (11/19/2017), Hyperlipidemia, Hypertension, Hypothyroidism, IDA (iron deficiency anemia) (11/06/2019), Impaired fasting glucose (12/12/2017), Insomnia (08/20/2013), Irritable bowel syndrome (11/29/2006), Left sided abdominal pain (12/13/2017), Liver hemangioma (08/24/2014), Malignant neoplasm of upper-outer quadrant of right breast in female, estrogen receptor positive ([place]) (03/20/2017), Migraines, Mild persistent asthma, uncomplicated (0/06/7046), Nasal polyp, unspecified (01/10/2018), Oral herpes (04/20/2013), Other allergic rhinitis (09/15/2014), Personal history of radiation therapy (2019), PONV (postoperative nausea and vomiting), Recurrent infections (10/05/2018), Right upper lobe pulmonary nodule (01/10/2018), Symptomatic varicose veins, right (09/02/2015), Ulcerative colitis ([place]), and Varicose veins.   reports that she quit smoking about 27 years ago. Her smoking use included cigarettes. She has  a 45.00 pack-year smoking history. She has never used smokeless tobacco.  Past Surgical History:  Procedure Laterality Date   BREAST BIOPSY Right 02/2017   BREAST BIOPSY Right 11/07/2019   BREAST LUMPECTOMY Right 04/24/2017   BREAST LUMPECTOMY WITH RADIOACTIVE SEED AND SENTINEL LYMPH NODE BIOPSY Right 04/24/2017   Procedure: RIGHT BREAST LUMPECTOMY WITH  RADIOACTIVE SEED AND SENTINEL LYMPH NODE BIOPSY;  Surgeon: [name], MD;  Location: [place];  Service: General;  Laterality: Right;   crapal tunnel relase Left 05/2018   ENDOMETRIAL ABLATION  2010   Had abnormal uterine bleeding (heavy and frequent), performed in [place], menses stopped completely 2 years after   EXPLORATORY LAPAROTOMY     Had exploratory surgery at the age of 58 for abdominal pain with cyst found on her left ovary, not treated surgically   FOOT SURGERY Right 2012   HAND SURGERY Right    RE-EXCISION OF BREAST LUMPECTOMY Right 05/15/2017   Procedure: RE-EXCISION OF BREAST LUMPECTOMY;  Surgeon: [name], MD;  Location: [place];  Service: General;  Laterality: Right;   RIGHT/LEFT HEART CATH AND CORONARY ANGIOGRAPHY N/A 06/21/2020   Procedure: RIGHT/LEFT HEART CATH AND CORONARY ANGIOGRAPHY;  Surgeon: Nelva Bush, MD;  Location: [place] [facility];  Service: Cardiovascular;  Laterality: N/A;   ROBOTIC ASSISTED SALPINGO OOPHERECTOMY Bilateral 08/29/2019   Procedure: XI ROBOTIC ASSISTED SALPINGO OOPHORECTOMY;  Surgeon: [name], MD;  Location: [facility];  Service: Gynecology;  Laterality: Bilateral;   Septoplasty with turbinate reduction     TONSILLECTOMY  1980   VEIN SURGERY     Removal Right Leg   WISDOM TOOTH EXTRACTION      Allergies  Allergen Reactions   Losartan Potassium Shortness Of Breath and Other (See Comments)   Ace Inhibitors Cough   Aspirin Other (See Comments)    Upset stomach   Cefaclor Hives and Other (See Comments)   Oxycodone  Hives   Sulfa Antibiotics Hives   Lisinopril Cough and Other (See Comments)    Immunization History  Administered Date(s) Administered   Hepatitis A, Adult 05/30/2019, 11/28/2019   Hepatitis B, adult 05/30/2019, 06/27/2019   Hepb-cpg 11/28/2019   Influenza Inj Mdck Quad Pf 01/21/2016   Influenza,inj,Quad PF,6+ Mos 12/20/2016, 01/22/2018, 12/18/2018, 01/16/2020   Influenza-Unspecified 12/16/2012, 01/15/2014, 01/16/2016, 12/16/2016, 01/16/2019   Moderna Sars-Covid-2 Vaccination 07/12/2019, 08/12/2019, 02/11/2020   Pneumococcal Polysaccharide-23 04/17/2004, 04/14/2013   Td 04/17/1997, 12/18/2018   Tdap 07/22/2008   Zoster Recombinat (Shingrix) 01/14/2019, 02/13/2019    Family History  Problem Relation Age of Onset   Arthritis Mother 64       Deceased   Breast cancer Mother    Lung cancer Mother    Hyperlipidemia Mother    Heart disease Mother    Hypertension Mother    Diabetes Mother    Crohn's disease Mother    Diabetes Maternal Grandfather    Heart disease Maternal Grandfather    Heart attack Maternal Grandfather    Leukemia Maternal Grandfather    Colitis Sister    Leukemia Cousin 3       mat cousin   Lung cancer Father    Brain cancer Father      Current Facility-Administered Medications:    0.9 %  sodium chloride infusion, 250 mL, Intravenous, Continuous, Allon Costlow, MD   docusate sodium (COLACE) capsule 100 mg, 100 mg, Oral, BID PRN, Brand Males, MD   norepinephrine (LEVOPHED) 70m in 2537mpremix infusion, 2-10 mcg/min, Intravenous, Titrated, Charlies Rayburn, MD, Last Rate: 3.75 mL/hr at 10/02/20 1301, 1 mcg/min at 10/02/20 1301   pantoprazole (PROTONIX) injection 40 mg, 40 mg, Intravenous, QHS, Maleeka Sabatino, MD   polyethylene glycol (MIRALAX / GLYCOLAX) packet 17 g, 17 g, Oral, Daily PRN, RaBrand MalesMD   sodium bicarbonate 1 mEq/mL injection, , , ,  sodium bicarbonate 150 mEq in sterile water 1,150 mL infusion, , Intravenous,  Continuous, Jocilyn Trego, MD   sodium bicarbonate injection 50 mEq, 50 mEq, Intravenous, Once, Brand Males, MD  Current Outpatient Medications:    acyclovir (ZOVIRAX) 400 MG tablet, TAKE 1 TABLET (400 MG TOTAL) BY MOUTH 3 (THREE) TIMES DAILY AS NEEDED. (Patient taking differently: Take 400 mg by mouth 3 (three) times daily as needed (outbreaks).), Disp: 270 tablet, Rfl: 1   Adalimumab 80 MG/0.8ML PNKT, Inject 80 mg into the skin every 14 (fourteen) days., Disp: , Rfl:    albuterol (VENTOLIN HFA) 108 (90 Base) MCG/ACT inhaler, Inhale 2 puffs into the lungs every 6 (six) hours as needed for shortness of breath., Disp: , Rfl:    ascorbic acid (VITAMIN C) 1000 MG tablet, Take 1,000 mg by mouth daily., Disp: , Rfl:    aspirin EC 81 MG tablet, Take 81 mg by mouth daily. Swallow whole., Disp: , Rfl:    Azelastine HCl 0.15 % SOLN, Place 2 sprays into both nostrils 2 (two) times daily. For 30 days, Disp: , Rfl:    Biotin 1000 MCG tablet, Take 1,000 mcg by mouth daily., Disp: , Rfl:    Calcium Carb-Cholecalciferol (CALCIUM+D3) 600-800 MG-UNIT TABS, Take 1 tablet by mouth daily., Disp: , Rfl:    carvedilol (COREG) 12.5 MG tablet, Take 1 tablet (12.5 mg total) by mouth 2 (two) times daily., Disp: 60 tablet, Rfl: 2   cetirizine (ZYRTEC) 10 MG tablet, Take 10 mg by mouth daily., Disp: , Rfl:    cyclobenzaprine (FLEXERIL) 5 MG tablet, Take 1 tablet (5 mg total) by mouth 3 (three) times daily as needed for up to 7 days for muscle spasms., Disp: 21 tablet, Rfl: 0   diphenoxylate-atropine (LOMOTIL) 2.5-0.025 MG tablet, Take 2 tablets by mouth 4 (four) times daily as needed for diarrhea or loose stools., Disp: , Rfl:    doxycycline (VIBRAMYCIN) 100 MG capsule, Take 1 capsule (100 mg total) by mouth 2 (two) times daily., Disp: 20 capsule, Rfl: 0   ENTRESTO 49-51 MG, TAKE 1 TABLET BY MOUTH TWICE A DAY (Patient taking differently: Take 1 tablet by mouth 2 (two) times daily.), Disp: 180 tablet, Rfl: 3    EPINEPHrine 0.3 mg/0.3 mL IJ SOAJ injection, Inject 0.3 mg into the muscle as needed for anaphylaxis., Disp: , Rfl:    exemestane (AROMASIN) 25 MG tablet, Take 25 mg by mouth daily after breakfast., Disp: , Rfl:    Glucosamine-Chondroit-Vit C-Mn (GLUCOSAMINE-CHONDROITIN) CAPS, Take 1 capsule by mouth daily., Disp: , Rfl:    HYDROcodone-acetaminophen (NORCO/VICODIN) 5-325 MG tablet, Take 1 tablet by mouth every 6 (six) hours as needed for up to 20 doses for severe pain or moderate pain., Disp: 20 tablet, Rfl: 0   Hyoscyamine Sulfate 0.375 MG TBCR, Take 0.375 mg by mouth in the morning and at bedtime., Disp: , Rfl:    lansoprazole (PREVACID SOLUTAB) 15 MG disintegrating tablet, Take 15 mg by mouth 2 (two) times daily before a meal., Disp: , Rfl:    levothyroxine (SYNTHROID) 75 MCG tablet, Take 1 tablet (75 mcg total) by mouth daily before breakfast., Disp: 30 tablet, Rfl: 6   MAGNESIUM PO, Take 250 mg by mouth daily., Disp: , Rfl:    meloxicam (MOBIC) 15 MG tablet, Take 15 mg by mouth daily as needed for pain (Hip pain)., Disp: , Rfl:    Menthol, Topical Analgesic, (BIOFREEZE COLORLESS) 4 % GEL, Apply 1 application topically daily as needed (Hip pain)., Disp: ,  Rfl:    mesalamine (LIALDA) 1.2 g EC tablet, Take 2.4 g by mouth in the morning and at bedtime. , Disp: , Rfl:    metFORMIN (GLUCOPHAGE) 500 MG tablet, Take 1 tablet (500 mg total) by mouth 2 (two) times daily with a meal., Disp: 180 tablet, Rfl: 3   montelukast (SINGULAIR) 10 MG tablet, Take 10 mg by mouth at bedtime. , Disp: , Rfl:    Omega 3 1000 MG CAPS, Take 2,000 mg by mouth every other day., Disp: , Rfl:    promethazine (PHENERGAN) 25 MG tablet, Take 0.5-1 tablets (12.5-25 mg total) by mouth daily as needed for nausea or vomiting., Disp: 30 tablet, Rfl: 5   rosuvastatin (CRESTOR) 20 MG tablet, Take 1 tablet (20 mg total) by mouth daily. (Patient taking differently: Take 20 mg by mouth at bedtime.), Disp: 90 tablet, Rfl: 3   spironolactone  (ALDACTONE) 25 MG tablet, Take 0.5 tablets (12.5 mg total) by mouth daily., Disp: 30 tablet, Rfl: 1   SYMBICORT 160-4.5 MCG/ACT inhaler, INHALE 2 PUFFS INTO THE LUNGS TWICE A DAY (Patient taking differently: Inhale 2 puffs into the lungs in the morning and at bedtime.), Disp: 30.6 each, Rfl: 1   triamcinolone (NASACORT) 55 MCG/ACT AERO nasal inhaler, Place 2 sprays into the nose daily., Disp: , Rfl:    Turmeric 500 MG CAPS, Take 500 mg by mouth daily., Disp: , Rfl:    venlafaxine XR (EFFEXOR-XR) 150 MG 24 hr capsule, Take 1 capsule (150 mg total) by mouth daily with breakfast., Disp: 90 capsule, Rfl: 1   zinc gluconate 50 MG tablet, Take 50 mg by mouth daily., Disp: , Rfl:    zolpidem (AMBIEN) 5 MG tablet, TAKE 1 TABLET BY MOUTH EVERY DAY AT BEDTIME AS NEEDED FOR SLEEP (Patient taking differently: Take 5 mg by mouth at bedtime.), Disp: 30 tablet, Rfl: 2   Significant Hospital Events: Including procedures, antibiotic start and stop dates in addition to other pertinent events   10/02/2020 - admit  Interim History / Subjective:  6/18 0 -seen by CCM at [facility] long emergency room recess  Objective   Blood pressure (!) 88/70, pulse (!) 125, temperature 97.7 F (36.5 C), temperature source Rectal, resp. rate (!) 22, SpO2 100 %.       No intake or output data in the 24 hours ending 10/02/20 1324 There were no vitals filed for this visit.  Examination: Anxious looking lady in pain.  Frequently moaning on facemask oxygen.  Alert and oriented x3.  Left subclavian area has a bruise.  She has diminished air entry on the left side.  Abdomen is soft.  Normal heart sounds.  No sinus no clubbing no edema.  She looks a bit pale. Labs/imaging that I havepersonally reviewed  (right click and "Reselect all SmartList Selections" daily)  Chest x-ray and CT chest concern for lead perforation from defibrillator wire.  LABS    PULMONARY Recent Labs  Lab 10/02/20 1149  TCO2 22    CBC Recent Labs  Lab  10/01/20 2038 10/02/20 1139 10/02/20 1149  HGB 12.9 11.6* 11.9*  HCT 38.3 35.1* 35.0*  WBC 16.6* 25.8*  --   PLT 434* 498*  --     COAGULATION No results for input(s): INR in the last 168 hours.  CARDIAC  No results for input(s): TROPONINI in the last 168 hours. No results for input(s): PROBNP in the last 168 hours.   CHEMISTRY Recent Labs  Lab 10/01/20 2038 10/02/20 1139 10/02/20 1149  NA 135 130* 129*  K 3.7 4.0 4.0  CL 103 95* 95*  CO2 23 23  --   GLUCOSE 131* 328* 319*  BUN 7 5* 3*  CREATININE 0.56 0.81 0.60  CALCIUM 9.2 9.4  --   MG 1.6*  --   --    Estimated Creatinine Clearance: 74.1 mL/min (by C-G formula based on SCr of 0.6 mg/dL).   LIVER Recent Labs  Lab 10/01/20 2038 10/02/20 1139  AST 16 15  ALT 16 14  ALKPHOS 60 58  BILITOT 0.8 0.9  PROT 6.0* 6.5  ALBUMIN 3.8 4.0     INFECTIOUS No results for input(s): LATICACIDVEN, PROCALCITON in the last 168 hours.   ENDOCRINE CBG (last 3)  Recent Labs    10/01/20 1138 10/01/20 1430 10/02/20 1151  GLUCAP 102* 120* 348*         IMAGING x48h  - image(s) personally visualized  -   highlighted in bold DG Chest 2 View  Result Date: 10/01/2020 CLINICAL DATA:  Status post defibrillator placement EXAM: CHEST - 2 VIEW COMPARISON:  06/15/2020 FINDINGS: Defibrillator is now noted in satisfactory position. Cardiac shadow is stable. Aortic calcifications are noted. No pneumothorax is seen. The lungs are clear. Postoperative changes in the right breast are again seen. IMPRESSION: No evidence of post placement pneumothorax. No acute abnormality noted. Electronically Signed   By: Inez Catalina M.D.   On: 10/01/2020 17:59   EP PPM/ICD IMPLANT  Result Date: 10/01/2020  SURGEON:  Allegra Lai, MD    PREPROCEDURE DIAGNOSES:  1. Nonichemic cardiomyopathy.  2. New York Heart Association class III, heart failure chronically.    POSTPROCEDURE DIAGNOSES:  1. Nonischemic cardiomyopathy.  2. New York Heart Association  class III heart failure chronically.    PROCEDURES:   1. ICD implantation.  2. Left upper extremity venography  3. Defibrillation threshold testing    INTRODUCTION: Erin Fields is a 55 y.o. female with a nonischemic CM (EF25%), NYHA Class III CHF, and CAD. At this time, she meets MADIT II/ SCD-HeFT criteria for ICD implantation for primary prevention of sudden death.  The patient has a narrow QRS and does not meet criteria for revascularization.  The patient has been treated with an optimal medical regimen but continues to have a depressed ejection fraction and NYHA Class II CHF symptoms.  The patient therefore  presents today for ICD implantation.    DESCRIPTION OF PROCEDURE:  Informed written consent was obtained and the patient was brought to the electrophysiology lab in the fasting state. The patient was adequately sedated with intravenous Versed, and fentanyl as outlined in the nursing report.  The patient's left chest was prepped and draped in the usual sterile fashion by the EP lab staff.  The skin overlying the left deltopectoral region was infiltrated with lidocaine for local analgesia.  A 5-cm incision was made over the left deltopectoral region.  A left subcutaneous defibrillator pocket was fashioned using a combination of sharp and blunt dissection.  Electrocautery was used to assure hemostasis. Left Upper extremity Venography:  A venogram of the left upper extremity was performed which revealed a moderate sized left axillary vein which emptied into a moderate sized left subclavian vein.  RV Lead Placement: The left axillary vein was cannulated with fluoroscopic visualization.  No contrast was required for this endeavor.  Through the left axillary vein, a [employer] [place], model 7122Q-65 (serial number [id number]) right ventricular defibrillator lead was advanced with fluoroscopic visualization into the right  ventricular apex.  Initial right ventricular lead R-wave measured 11.4 mV with impedance  of 822 ohms and a threshold of 0.8 volts at 0.5 milliseconds. The leads were secured to the pectoralis  fascia using #2 silk suture over the suture sleeves.  The pocket then  irrigated with copious gentamicin solution.  The leads were then  connected to an [place] (serial  Number [id number]) ICD.  The defibrillator was placed into the  pocket.  The pocket was then closed in 3 layers with 2.0 Vicryl suture  for the subcutaneous and 3.0 Vicryl suture subcuticular layers.  EBL<94m. Steri-Strips and a  sterile dressing were then applied.    CONCLUSIONS:  1. Ischemic cardiomyopathy with chronic New York Heart Association class II heart failure.  2. Successful ICD implantation.  3. DFT less than or equal to 15 joules.  4. No inducible VT or VF with PES  5. No early apparent complications.   DG Chest Port 1 View  Result Date: 10/02/2020 CLINICAL DATA:  Weakness EXAM: PORTABLE CHEST 1 VIEW COMPARISON:  10/01/2020 FINDINGS: Single lead left-sided implanted cardiac device. The position of the distal lead is now more superiorly oriented compared to the recent previous radiograph. Difficult to determine the exact location given lack of orthogonal view. Heart size within normal limits. Interval development of a large left-sided pleural effusion. Left basilar opacity, likely compressive atelectasis. Right lung remains clear. No pneumothorax is seen. IMPRESSION: 1. Interval development of large left-sided pleural effusion. 2. The position of the different later lead is now more superiorly oriented compared to the recent previous radiograph. Difficult to determine the exact location given lack of orthogonal view. Consider follow-up with two view chest x-ray or CT of the chest for further evaluation. These results were discussed by telephone at the time of interpretation on 10/02/2020 at 12:35 pm with provider Dr KCaryl Comes who verbally acknowledged these results. Electronically Signed   By: NDavina PokeD.O.    On: 10/02/2020 12:35   DG Chest Port 1 View  Result Date: 10/01/2020 CLINICAL DATA:  Chest pain EXAM: PORTABLE CHEST 1 VIEW COMPARISON:  10/01/2020, 06/15/2020 FINDINGS: Left-sided single lead pacing device with tip projecting over cardiac apex. Cardiac size upper limits of normal. No focal opacity, pleural effusion, or pneumothorax. IMPRESSION: Similar appearance of left-sided pacing device allowing for patient rotation. Lung fields are clear. Electronically Signed   By: KDonavan FoilM.D.   On: 10/01/2020 21:02   ECHOCARDIOGRAM LIMITED  Result Date: 10/02/2020    ECHOCARDIOGRAM LIMITED REPORT   Patient Name:   Erin Fields Date of Exam: 10/02/2020 Medical Rec #:  [id number]        Height:       63.5 in Accession #:    [id number]       Weight:       156.0 lb Date of Birth:  [birth year]         BSA:          1.750 m Patient Age:    89years          BP:           76/56 mmHg Patient Gender: F                 HR:           121 bpm. Exam Location:  Inpatient Procedure: Limited Echo STAT ECHO Indications:    Pericardial Effusion  History:  Patient has prior history of Echocardiogram examinations, most                 recent 07/23/2020. CHF, CAD; Signs/Symptoms:Chest Pain. AICD                 implanted on 10/01/2020.  Sonographer:    Darlina Sicilian RDCS Referring Phys: [place]  1. Left ventricular ejection fraction, by estimation, is 30 to 35%. The left ventricle has moderately decreased function.  2. Diastolic RV collapse is noted. There is a moderate-sized echogenic fluid collection overlying the RV, suggestive of hemopericardium.. Right ventricular systolic function is hyperdynamic. The right ventricular size is small.  3. Echogenic fluid collection over the RV concering for hemopericardium. Tamponade physiology is suspected given hypotension, RA/RV collapse, non-dilated IVC but it does not collapse. Moderate pericardial effusion. The pericardial effusion is localized near the  right ventricle and surrounding the apex. Findings are consistent with possible cardiac tamponade.  4. The inferior vena cava is normal in size with <50% respiratory variability, suggesting right atrial pressure of 8 mmHg. Comparison(s): Changes from prior study are noted. 07/23/2020: LVEF 20-25%, no pericardial effusion, IVC normal. Conclusion(s)/Recommendation(s): Critical findings reported to Dr. Caryl Comes and acknowledged at 1:25 pm on 10/02/2020. He reported a CT scan demonstrated a perforated ICD lead - CT surgery is aware. FINDINGS  Left Ventricle: Left ventricular ejection fraction, by estimation, is 30 to 35%. The left ventricle has moderately decreased function. Right Ventricle: Diastolic RV collapse is noted. There is a modearate-sized echogenic fluid collection overlying the RV, suggestive of hemopericardium. The right ventricular size is small. No increase in right ventricular wall thickness. Right ventricular systolic function is hyperdynamic. Pericardium: Echogenic fluid collection over the RV concering for hemopericardium. Tamponade physiology is suspected given hypotension, RA/RV collapse, non-dilated IVC but it does not collapse. A moderately sized pericardial effusion is present. The pericardial effusion is localized near the right ventricle and surrounding the apex. There is diastolic collapse of the right ventricular free wall and diastolic collapse of the right atrial wall. There is evidence of cardiac tamponade. Venous: The inferior vena cava is normal in size with less than 50% respiratory variability, suggesting right atrial pressure of 8 mmHg. Lyman Bishop MD Electronically signed by Lyman Bishop MD Signature Date/Time: 10/02/2020/1:33:23 PM    Final      Resolved Hospital Problem list     Assessment & Plan:   -Acute hypoxemic respiratory failure associated with new onset of left pleural effusion - present on admission  -Concern for hemothorax secondary to defibrillator lead  displacement  Plan  - Oxygen for pulse ox goal greater than 88% - Cardiothoracic consultation for possible emergent exploration and repair in the OR at [facility] need chest tube placement by CCM after discussion with cardiology and CVTS  Nonischemic cardiomyopathy with chronic systolic heart failure ejection fraction 25% -at baseline present on admission Status post defibrillator placement 10/01/2020 Circulatory shock  -present on admission 10/02/2020 likely due to volume depletion/hemorrhage.  Blood pressure intermittently running soft versus normal   Plan  - MAP goal greater than 65 -Levophed via PIV -Start bicarbonate infusion after bicarb bolus to help tide over any acidosis and instability for transfer   Anemia present on admission likely hemorrhagic  Plan  - Per above  Best Practice (right click and "Reselect all SmartList Selections" daily)   Diet/type: NPO Pain/Anxiety/Delirium protocol Yes and RASS goal: 0 to -1 VAP protocol (if indicated): Not indicated DVT  prophylaxis: SCD GI prophylaxis: PPI Glucose control:  SSI Central venous access:  N/A Arterial line:  N/A Foley:  N/A Mobility:  bed rest  PT consulted: N/A Studies pending: other CVTS consult Culture data pending:none Last reviewed culture data:today Antibiotics:not indicated  Antibiotic de-escalation: N/A Stop date: N/A Daily labs: ordered Code Status:  full code Last date of multidisciplinary goals of care discussion [NA] ccm prognosis: Serious Disposition: admit to the intensive care        [place]   The patient Erin Fields is critically ill with multiple organ systems failure and requires high complexity decision making for assessment and support, frequent evaluation and titration of therapies, application of advanced monitoring technologies and extensive interpretation of multiple databases.   Critical Care Time devoted to patient care services  described in this note is  60  Minutes. This time reflects time of care of this signee Dr Brand Males. This critical care time does not reflect procedure time, or teaching time or supervisory time of PA/NP/Med student/Med Resident etc but could involve care discussion time     Dr. Brand Males, M.D., [facility].C.P Pulmonary and Critical Care Medicine Staff Physician [place] [facility] Pager: [phone], If no answer or between  15:00h - 7:00h: call [phone]  10/02/2020 1:32 PM

## 2020-10-02 NOTE — ED Notes (Signed)
Pt's BP 80/65 on 63mg Levophed. Levophed increased to 263m/min at this time.

## 2020-10-03 ENCOUNTER — Inpatient Hospital Stay (HOSPITAL_COMMUNITY): Payer: No Typology Code available for payment source

## 2020-10-03 DIAGNOSIS — S2699XA Other injury of heart, unspecified with or without hemopericardium, initial encounter: Secondary | ICD-10-CM

## 2020-10-03 DIAGNOSIS — I9789 Other postprocedural complications and disorders of the circulatory system, not elsewhere classified: Secondary | ICD-10-CM | POA: Diagnosis not present

## 2020-10-03 LAB — CBC
HCT: 43.3 % (ref 36.0–46.0)
HCT: 41.1 % (ref 36.0–46.0)
Hemoglobin: 15.1 g/dL — ABNORMAL HIGH (ref 12.0–15.0)
Hemoglobin: 14 g/dL (ref 12.0–15.0)
MCH: 31.3 pg (ref 26.0–34.0)
MCH: 30.7 pg (ref 26.0–34.0)
MCHC: 34.9 g/dL (ref 30.0–36.0)
MCHC: 34.1 g/dL (ref 30.0–36.0)
MCV: 89.8 fL (ref 80.0–100.0)
MCV: 90.1 fL (ref 80.0–100.0)
Platelets: 145 10*3/uL — ABNORMAL LOW (ref 150–400)
Platelets: 135 10*3/uL — ABNORMAL LOW (ref 150–400)
RBC: 4.82 MIL/uL (ref 3.87–5.11)
RBC: 4.56 MIL/uL (ref 3.87–5.11)
RDW: 13.4 % (ref 11.5–15.5)
RDW: 13.4 % (ref 11.5–15.5)
WBC: 19.2 10*3/uL — ABNORMAL HIGH (ref 4.0–10.5)
WBC: 16.9 10*3/uL — ABNORMAL HIGH (ref 4.0–10.5)
nRBC: 0 % (ref 0.0–0.2)
nRBC: 0 % (ref 0.0–0.2)

## 2020-10-03 LAB — BASIC METABOLIC PANEL
Anion gap: 6 (ref 5–15)
Anion gap: 5 (ref 5–15)
BUN: <5 mg/dL — ABNORMAL LOW (ref 6–20)
BUN: <5 mg/dL — ABNORMAL LOW (ref 6–20)
CO2: 24 mmol/L (ref 22–32)
CO2: 27 mmol/L (ref 22–32)
Calcium: 7.5 mg/dL — ABNORMAL LOW (ref 8.9–10.3)
Calcium: 7.9 mg/dL — ABNORMAL LOW (ref 8.9–10.3)
Chloride: 105 mmol/L (ref 98–111)
Chloride: 101 mmol/L (ref 98–111)
Creatinine, Ser: 0.5 mg/dL (ref 0.44–1.00)
Creatinine, Ser: 0.48 mg/dL (ref 0.44–1.00)
GFR, Estimated: >60 mL/min (ref >60)
GFR, Estimated: >60 mL/min (ref >60)
Glucose, Bld: 146 mg/dL — ABNORMAL HIGH (ref 70–99)
Glucose, Bld: 129 mg/dL — ABNORMAL HIGH (ref 70–99)
Potassium: 3.7 mmol/L (ref 3.5–5.1)
Potassium: 3.8 mmol/L (ref 3.5–5.1)
Sodium: 135 mmol/L (ref 135–145)
Sodium: 133 mmol/L — ABNORMAL LOW (ref 135–145)

## 2020-10-03 LAB — PHOSPHORUS: Phosphorus: 3.1 mg/dL (ref 2.5–4.6)

## 2020-10-03 LAB — GLUCOSE, CAPILLARY
Glucose-Capillary: 126 mg/dL — ABNORMAL HIGH (ref 70–99)
Glucose-Capillary: 152 mg/dL — ABNORMAL HIGH (ref 70–99)
Glucose-Capillary: 139 mg/dL — ABNORMAL HIGH (ref 70–99)
Glucose-Capillary: 131 mg/dL — ABNORMAL HIGH (ref 70–99)
Glucose-Capillary: 131 mg/dL — ABNORMAL HIGH (ref 70–99)
Glucose-Capillary: 151 mg/dL — ABNORMAL HIGH (ref 70–99)
Glucose-Capillary: 138 mg/dL — ABNORMAL HIGH (ref 70–99)

## 2020-10-03 LAB — MAGNESIUM
Magnesium: 2 mg/dL (ref 1.7–2.4)
Magnesium: 1.5 mg/dL — ABNORMAL LOW (ref 1.7–2.4)

## 2020-10-03 MED ORDER — INSULIN ASPART 100 UNIT/ML IJ SOLN
0.0000 [IU] | INTRAMUSCULAR | Status: DC
  Administered 2020-10-03 – 2020-10-04 (×5): 2 [IU] via SUBCUTANEOUS

## 2020-10-03 MED ORDER — SPIRONOLACTONE 12.5 MG HALF TABLET
12.5000 mg | ORAL_TABLET | Freq: Every day | ORAL | Status: DC
  Administered 2020-10-03 – 2020-10-07 (×5): 12.5 mg via ORAL
  Filled 2020-10-03 (×5): qty 1

## 2020-10-03 MED ORDER — MAGNESIUM SULFATE 4 GM/100ML IV SOLN
INTRAVENOUS | Status: AC
  Administered 2020-10-03: 4 g via INTRAVENOUS
  Filled 2020-10-03: qty 100

## 2020-10-03 MED ORDER — DOCUSATE SODIUM 100 MG PO CAPS
100.0000 mg | ORAL_CAPSULE | Freq: Two times a day (BID) | ORAL | Status: DC
  Administered 2020-10-03 – 2020-10-07 (×5): 100 mg via ORAL
  Filled 2020-10-03 (×7): qty 1

## 2020-10-03 MED ORDER — ASPIRIN EC 81 MG PO TBEC
81.0000 mg | DELAYED_RELEASE_TABLET | Freq: Every day | ORAL | Status: DC
  Administered 2020-10-04 – 2020-10-07 (×4): 81 mg via ORAL
  Filled 2020-10-03 (×4): qty 1

## 2020-10-03 MED ORDER — MAGNESIUM SULFATE 4 GM/100ML IV SOLN: 4.0000 g | Freq: Once | INTRAVENOUS | Status: AC

## 2020-10-03 MED ORDER — ENOXAPARIN SODIUM 40 MG/0.4ML IJ SOSY
40.0000 mg | PREFILLED_SYRINGE | Freq: Every day | INTRAMUSCULAR | Status: DC
  Administered 2020-10-04 – 2020-10-06 (×3): 40 mg via SUBCUTANEOUS
  Filled 2020-10-03 (×3): qty 0.4

## 2020-10-03 MED ORDER — POTASSIUM CHLORIDE 10 MEQ/50ML IV SOLN
10.0000 meq | INTRAVENOUS | Status: AC
  Administered 2020-10-03 (×3): 10 meq via INTRAVENOUS
  Filled 2020-10-03 (×2): qty 50

## 2020-10-03 MED ORDER — FUROSEMIDE 10 MG/ML IJ SOLN
40.0000 mg | Freq: Once | INTRAMUSCULAR | Status: AC
  Administered 2020-10-03: 40 mg via INTRAVENOUS
  Filled 2020-10-03: qty 4

## 2020-10-03 MED ORDER — CARVEDILOL 12.5 MG PO TABS
12.5000 mg | ORAL_TABLET | Freq: Two times a day (BID) | ORAL | Status: DC
  Administered 2020-10-03 – 2020-10-07 (×9): 12.5 mg via ORAL
  Filled 2020-10-03 (×9): qty 1

## 2020-10-03 MED ORDER — LIDOCAINE 5 % EX PTCH
1.0000 | MEDICATED_PATCH | CUTANEOUS | Status: DC
  Administered 2020-10-03 – 2020-10-06 (×4): 1 via TRANSDERMAL
  Filled 2020-10-03 (×4): qty 1

## 2020-10-03 NOTE — Addendum Note (Signed)
Addendum  created 10/03/20 8845 by Roberts Gaudy, MD   Clinical Note Signed

## 2020-10-03 NOTE — Progress Notes (Signed)
Anesthesiology Follow-up:  55 year old female one day S/P sternotomy and repair of LV perforation by AICD wire and evacuation of L. Hemothorax. Now awake and alert, neuro intact, sleepy. Hemodynamically stable.  VS: T-38.4 BP- 130/95 HR 112 (SR)  RR- 24 )2 sat 98% on 3l [place]  K-3.7 BUN/Cr- 5/0.5 glucose-146 H/H- 15.1/43.3 Plts- 145,000  Extubated 4 1/2 hours post-op. Doing well overall.  Roberts Gaudy

## 2020-10-03 NOTE — Progress Notes (Signed)
Pt has PRN Bipap orders. No distress noted at this time.  Bipap on standby if needed.

## 2020-10-03 NOTE — Progress Notes (Signed)
Progress Note  Patient Name: Erin Fields Date of Encounter: 10/03/2020  Primary Cardiologist: Jenne Campus, Fields     Patient Profile     55 y.o. female admitted 6/18 for pain hemothorax and shock 2 d following ICD implant with evidence of extracardiac perforation of ICD lead.  At repair-- sternotomy and puncture  lead was in L chest  Subjective   Feels better but very sore w pleurisy  Inpatient Medications    Scheduled Meds:  acetaminophen  1,000 mg Oral Q6H   Or   acetaminophen (TYLENOL) oral liquid 160 mg/5 mL  1,000 mg Per Tube Q6H   aspirin EC  325 mg Oral Daily   bisacodyl  10 mg Oral Daily   Or   bisacodyl  10 mg Rectal Daily   chlorhexidine gluconate (MEDLINE KIT)  15 mL Mouth Rinse BID   Chlorhexidine Gluconate Cloth  6 each Topical Daily   docusate  100 mg Per Tube BID   [START ON 10/04/2020] enoxaparin (LOVENOX) injection  40 mg Subcutaneous QHS   hyoscyamine  0.375 mg Oral BID   insulin aspart  0-24 Units Subcutaneous Q4H   levothyroxine  75 mcg Oral QAC breakfast   mometasone-formoterol  2 puff Inhalation BID   montelukast  10 mg Oral QHS   pantoprazole (PROTONIX) IV  40 mg Intravenous Daily   polyethylene glycol  17 g Per Tube Daily   rosuvastatin  20 mg Oral QHS   sodium chloride flush  10-40 mL Intracatheter Q12H   sodium chloride flush  3 mL Intravenous Q12H   venlafaxine XR  150 mg Oral Q breakfast   Continuous Infusions:  sodium chloride     famotidine (PEPCID) IV Stopped (10/02/20 2214)   lactated ringers     lactated ringers 10 mL/hr at 10/03/20 0700   levofloxacin (LEVAQUIN) IV     norepinephrine (LEVOPHED) Adult infusion Stopped (10/03/20 4562)   potassium chloride 10 mEq (10/03/20 0745)   PRN Meds: dextrose, fentaNYL (SUBLIMAZE) injection, metoprolol tartrate, ondansetron (ZOFRAN) IV, sodium chloride flush, sodium chloride flush   Vital Signs    Vitals:   10/03/20 0645 10/03/20 0700 10/03/20 0715 10/03/20 0730  BP: 130/77 (!)  130/95 121/78 127/88  Pulse: (!) 112 (!) 112 (!) 110 (!) 113  Resp: (!) 28 (!) 27 (!) 26 (!) 26  Temp: (!) 100.94 F (38.3 C) (!) 101.12 F (38.4 C) (!) 101.12 F (38.4 C) (!) 100.94 F (38.3 C)  TempSrc:      SpO2: 97% 98% 97% 97%  Weight:        Intake/Output Summary (Last 24 hours) at 10/03/2020 0757 Last data filed at 10/03/2020 0700 Gross per 24 hour  Intake 6824.61 ml  Output 5065 ml  Net 1759.61 ml   Filed Weights   10/03/20 0500  Weight: 79 kg    Telemetry    Sinus w freq PVCs - Personally Reviewed PVCs noted yday, but on review of prior tracings X 5, no PVCs noted ECG       Physical Exam    GEN: modest discomfort but without  distress.   Neck: No JVD Cardiac: RRR, no murmurs, rubs, but distant and bandaged Respiratory: Clear to auscultation laterally. GI: Soft, nontender, non-distended  MS: No edema; No deformity. Neuro:  Nonfocal  Psych: Normal affect   Labs    Chemistry Recent Labs  Lab 10/01/20 2038 10/02/20 1139 10/02/20 1149 10/02/20 1516 10/02/20 1636 10/02/20 2213 10/02/20 2245 10/03/20 0443  NA 135 130*   < >  136   < > 139 136 135  K 3.7 4.0   < > 5.4*   < > 4.1 3.9 3.7  CL 103 95*   < > 102  --   --  107 105  CO2 23 23  --   --   --   --  25 24  GLUCOSE 131* 328*   < > 286*  --   --  136* 146*  BUN 7 5*   < > 3*  --   --  <5* <5*  CREATININE 0.56 0.81   < > 0.40*  --   --  0.46 0.50  CALCIUM 9.2 9.4  --   --   --   --  7.0* 7.5*  PROT 6.0* 6.5  --   --   --   --   --   --   ALBUMIN 3.8 4.0  --   --   --   --   --   --   AST 16 15  --   --   --   --   --   --   ALT 16 14  --   --   --   --   --   --   ALKPHOS 60 58  --   --   --   --   --   --   BILITOT 0.8 0.9  --   --   --   --   --   --   GFRNONAA >60 >60  --   --   --   --  >60 >60  ANIONGAP 9 12  --   --   --   --  4* 6   < > = values in this interval not displayed.     Hematology Recent Labs  Lab 10/02/20 1640 10/02/20 2035 10/02/20 2213 10/02/20 2245  10/03/20 0443  WBC 16.8*  --   --  15.3* 19.2*  RBC 5.07  --   --  4.75 4.82  HGB 15.7*   < > 13.9 14.7 15.1*  HCT 45.5   < > 41.0 42.7 43.3  MCV 89.7  --   --  89.9 89.8  MCH 31.0  --   --  30.9 31.3  MCHC 34.5  --   --  34.4 34.9  RDW 12.8  --   --  13.1 13.4  PLT 100*  --   --  128* 145*   < > = values in this interval not displayed.    Cardiac EnzymesNo results for input(s): TROPONINI in the last 168 hours. No results for input(s): TROPIPOC in the last 168 hours.   BNPNo results for input(s): BNP, PROBNP in the last 168 hours.   DDimer No results for input(s): DDIMER in the last 168 hours.   Radiology    DG Chest 2 View  Result Date: 10/01/2020 CLINICAL DATA:  Status post defibrillator placement EXAM: CHEST - 2 VIEW COMPARISON:  06/15/2020 FINDINGS: Defibrillator is now noted in satisfactory position. Cardiac shadow is stable. Aortic calcifications are noted. No pneumothorax is seen. The lungs are clear. Postoperative changes in the right breast are again seen. IMPRESSION: No evidence of post placement pneumothorax. No acute abnormality noted. Electronically Signed   By: Inez Catalina M.D.   On: 10/01/2020 17:59   CT Chest Wo Contrast  Result Date: 10/02/2020 CLINICAL DATA:  Recent ICD implantation. Patient complains of chest pain and weakness. Chest radiograph demonstrates a large  new left pleural effusion. In addition, concern for abnormal position the ICD lead. EXAM: CT CHEST WITHOUT CONTRAST TECHNIQUE: Multidetector CT imaging of the chest was performed following the standard protocol without IV contrast. COMPARISON:  Chest radiograph 10/02/2020 FINDINGS: Cardiovascular: Left chest single lead ICD. Left subclavian lead extends down the SVC into the right heart and the tip is lateral to the right ventricle and suggestive for a cardiac perforation. Heart and mediastinum are slightly shifted towards the right due to the left pleural fluid and left lung densities. Evidence for coronary  artery calcifications. No significant pericardial effusion. Mediastinum/Nodes: Mediastinal is shifted towards the right side of the chest. No significant lymph node enlargement in the chest. Surgical clips in the right axilla. Lungs/Pleura: Extensive volume loss in the left lung related to a large pleural effusion and bulky hyperdense areas along the periphery of the lung. Lung findings are suggestive for hemorrhage with a hemothorax. Patchy airspace densities in the aerated left lung could be related to atelectasis or hemorrhage. Right lung is clear except for atelectasis along the medial right upper lobe. No significant right pleural fluid. Upper Abdomen: Evidence for a high-density tablet in the distal stomach region. No acute abnormality in the upper abdomen. Musculoskeletal: Soft tissue edema and small amount of gas in the left chest compatible with recent ICD placement. No acute bone abnormality. IMPRESSION: 1. Right ventricular ICD lead perforation. ICD lead is located left of the right ventricle and appears to be outside of the heart. Large amount of high-density material within the left lung is compatible with hemorrhage or hematoma. New left pleural effusion is compatible with a hemothorax. 2. Mild mediastinal shift towards the right due to the blood products in the left hemithorax. Critical Value/emergent results were called by telephone at the time of interpretation on 10/02/2020 at 1:19 pm to provider Jolyn Nap, Fields, who verbally acknowledged these results. Electronically Signed   By: Markus Daft M.D.   On: 10/02/2020 13:40   EP PPM/ICD IMPLANT  Result Date: 10/01/2020  SURGEON:  Allegra Lai, Fields    PREPROCEDURE DIAGNOSES:  1. Nonichemic cardiomyopathy.  2. New York Heart Association class III, heart failure chronically.    POSTPROCEDURE DIAGNOSES:  1. Nonischemic cardiomyopathy.  2. New York Heart Association class III heart failure chronically.    PROCEDURES:   1. ICD implantation.  2. Left upper  extremity venography  3. Defibrillation threshold testing    INTRODUCTION: Erin Fields is a 55 y.o. female with a nonischemic CM (EF25%), NYHA Class III CHF, and CAD. At this time, she meets MADIT II/ SCD-HeFT criteria for ICD implantation for primary prevention of sudden death.  The patient has a narrow QRS and does not meet criteria for revascularization.  The patient has been treated with an optimal medical regimen but continues to have a depressed ejection fraction and NYHA Class II CHF symptoms.  The patient therefore  presents today for ICD implantation.    DESCRIPTION OF PROCEDURE:  Informed written consent was obtained and the patient was brought to the electrophysiology lab in the fasting state. The patient was adequately sedated with intravenous Versed, and fentanyl as outlined in the nursing report.  The patient's left chest was prepped and draped in the usual sterile fashion by the EP lab staff.  The skin overlying the left deltopectoral region was infiltrated with lidocaine for local analgesia.  A 5-cm incision was made over the left deltopectoral region.  A left subcutaneous defibrillator pocket was fashioned using a  combination of sharp and blunt dissection.  Electrocautery was used to assure hemostasis. Left Upper extremity Venography:  A venogram of the left upper extremity was performed which revealed a moderate sized left axillary vein which emptied into a moderate sized left subclavian vein.  RV Lead Placement: The left axillary vein was cannulated with fluoroscopic visualization.  No contrast was required for this endeavor.  Through the left axillary vein, a [employer] Jude [employer], model 7122Q-65 (serial number [id number]) right ventricular defibrillator lead was advanced with fluoroscopic visualization into the right ventricular apex.  Initial right ventricular lead R-wave measured 11.4 mV with impedance of 822 ohms and a threshold of 0.8 volts at 0.5 milliseconds. The leads were secured to  the pectoralis  fascia using #2 silk suture over the suture sleeves.  The pocket then  irrigated with copious gentamicin solution.  The leads were then  connected to an [place] (serial  Number [id number]) ICD.  The defibrillator was placed into the  pocket.  The pocket was then closed in 3 layers with 2.0 Vicryl suture  for the subcutaneous and 3.0 Vicryl suture subcuticular layers.  EBL<19m. Steri-Strips and a  sterile dressing were then applied.    CONCLUSIONS:  1. Ischemic cardiomyopathy with chronic New York Heart Association class II heart failure.  2. Successful ICD implantation.  3. DFT less than or equal to 15 joules.  4. No inducible VT or VF with PES  5. No early apparent complications.   DG Chest Port 1 View  Result Date: 10/02/2020 CLINICAL DATA:  Post evacuation of hematoma EXAM: PORTABLE CHEST 1 VIEW COMPARISON:  10/02/2020 FINDINGS: Endotracheal tube in good position. Three chest tubes on the left with marked improvement in left effusion. There remains a small amount of pleural thickening and left lower lobe atelectasis which has improved. Right lung clear Right jugular central venous catheter tip in the SVC. No pneumothorax. IMPRESSION: Three chest tubes in the left chest with decrease in left pleural effusion and improved aeration left lung base. No pneumothorax. Electronically Signed   By: CFranchot GalloM.D.   On: 10/02/2020 16:54   DG Chest Port 1 View  Result Date: 10/02/2020 CLINICAL DATA:  Weakness EXAM: PORTABLE CHEST 1 VIEW COMPARISON:  10/01/2020 FINDINGS: Single lead left-sided implanted cardiac device. The position of the distal lead is now more superiorly oriented compared to the recent previous radiograph. Difficult to determine the exact location given lack of orthogonal view. Heart size within normal limits. Interval development of a large left-sided pleural effusion. Left basilar opacity, likely compressive atelectasis. Right lung remains clear. No  pneumothorax is seen. IMPRESSION: 1. Interval development of large left-sided pleural effusion. 2. The position of the different later lead is now more superiorly oriented compared to the recent previous radiograph. Difficult to determine the exact location given lack of orthogonal view. Consider follow-up with two view chest x-ray or CT of the chest for further evaluation. These results were discussed by telephone at the time of interpretation on 10/02/2020 at 12:35 pm with provider Dr KCaryl Comes who verbally acknowledged these results. Electronically Signed   By: NDavina PokeD.O.   On: 10/02/2020 12:35   DG Chest Port 1 View  Result Date: 10/01/2020 CLINICAL DATA:  Chest pain EXAM: PORTABLE CHEST 1 VIEW COMPARISON:  10/01/2020, 06/15/2020 FINDINGS: Left-sided single lead pacing device with tip projecting over cardiac apex. Cardiac size upper limits of normal. No focal opacity, pleural effusion, or pneumothorax. IMPRESSION: Similar appearance of left-sided pacing  device allowing for patient rotation. Lung fields are clear. Electronically Signed   By: Donavan Foil M.D.   On: 10/01/2020 21:02   ECHO INTRAOPERATIVE TEE (ANESTHESIA ONLY)  Result Date: 10/02/2020  *INTRAOPERATIVE TRANSESOPHAGEAL REPORT *  Patient Name:   YARITHZA MINK Vilar Date of Exam: 10/02/2020 Medical Rec #:  [id number]         Height:       63.5 in Accession #:    [id number]        Weight:       156.0 lb Date of Birth:  07-Jan-1966          BSA:          1.75 m Patient Age:    27 years          BP:           88/70 mmHg Patient Gender: F                 HR:           120 bpm. Exam Location:  Inpatient Transesophogeal exam was perform intraoperatively during surgical procedure. Patient was closely monitored under general anesthesia during the entirety of examination. Indications:     perforation of lead into chest. emergency surgery. Performing Phys: [id number] DAVID JOSLIN Diagnosing Phys: Erin Fields Complications: No known complications during  this procedure. PRE-OP FINDINGS  Left Ventricle: The patient was on high dose norepinephrine and the heart rate was 135. The LV was contracting vigorously and the ejection fraction was estiamted at 60-65% using the 2D Simpson's method in the 4 and 2 chamber views. The LV wall thickness  was normal. Right Ventricle: The right ventricle has normal systolic function. The cavity was normal. There was an AICD wire seen entering the right atrium from the superior vena cava and crossing the tricuspid valve into the right ventricle. Left Atrium: No left atrial/left atrial appendage thrombus was detected. The LA cavity was enlarged and measured 4.4 cm in the media-lateral dimension. Interatrial Septum: No atrial level shunt detected by color flow Doppler. There is no evidence of a patent foramen ovale. Pericardium: There is a large pleural effusion in the left lateral region. There was no pericardial fluid seen. Mitral Valve: Mitral valve regurgitation is trivial by color flow Doppler. There is No evidence of mitral stenosis. Tricuspid Valve: The tricuspid valve was normal in structure. Tricuspid valve regurgitation is trivial by color flow Doppler. No evidence of tricuspid stenosis is present. Aortic Valve: The aortic valve is tricuspid Aortic valve regurgitation was not visualized by color flow Doppler. There is no stenosis of the aortic valve. The aortic valve leaflets were mildly thickened but opened normally without restriction. Pulmonic Valve: The pulmonic valve was normal in structure, with normal. No evidence of pumonic stenosis. Pulmonic valve regurgitation is not visualized by color flow Doppler. Shunts: There is no evidence of an atrial septal defect.  Erin Fields Electronically signed by Erin Fields Signature Date/Time: 10/02/2020/8:30:17 PM    Final    ECHOCARDIOGRAM LIMITED  Result Date: 10/02/2020    ECHOCARDIOGRAM LIMITED REPORT   Patient Name:   Erin Fields Date of Exam: 10/02/2020 Medical  Rec #:  [id number]         Height:       63.5 in Accession #:    [id number]        Weight:       156.0 lb Date of Birth:  1966/03/30  BSA:          1.750 m Patient Age:    65 years          BP:           76/56 mmHg Patient Gender: F                 HR:           121 bpm. Exam Location:  Inpatient Procedure: Limited Echo STAT ECHO Indications:    Pericardial Effusion  History:        Patient has prior history of Echocardiogram examinations, most                 recent 07/23/2020. CHF, CAD; Signs/Symptoms:Chest Pain. AICD                 implanted on 10/01/2020.  Sonographer:    Darlina Sicilian RDCS Referring Phys: [place]  1. Left ventricular ejection fraction, by estimation, is 30 to 35%. The left ventricle has moderately decreased function.  2. Diastolic RV collapse is noted. There is a moderate-sized echogenic fluid collection overlying the RV, suggestive of hemopericardium.. Right ventricular systolic function is hyperdynamic. The right ventricular size is small.  3. Echogenic fluid collection over the RV concering for hemopericardium. Tamponade physiology is suspected given hypotension, RA/RV collapse, non-dilated IVC but it does not collapse. Moderate pericardial effusion. The pericardial effusion is localized near the right ventricle and surrounding the apex. Findings are consistent with possible cardiac tamponade.  4. The inferior vena cava is normal in size with <50% respiratory variability, suggesting right atrial pressure of 8 mmHg. Comparison(s): Changes from prior study are noted. 07/23/2020: LVEF 20-25%, no pericardial effusion, IVC normal. Conclusion(s)/Recommendation(s): Critical findings reported to Dr. Caryl Comes and acknowledged at 1:25 pm on 10/02/2020. He reported a CT scan demonstrated a perforated ICD lead - CT surgery is aware. FINDINGS  Left Ventricle: Left ventricular ejection fraction, by estimation, is 30 to 35%. The left ventricle has moderately decreased function. Right  Ventricle: Diastolic RV collapse is noted. There is a modearate-sized echogenic fluid collection overlying the RV, suggestive of hemopericardium. The right ventricular size is small. No increase in right ventricular wall thickness. Right ventricular systolic function is hyperdynamic. Pericardium: Echogenic fluid collection over the RV concering for hemopericardium. Tamponade physiology is suspected given hypotension, RA/RV collapse, non-dilated IVC but it does not collapse. A moderately sized pericardial effusion is present. The pericardial effusion is localized near the right ventricle and surrounding the apex. There is diastolic collapse of the right ventricular free wall and diastolic collapse of the right atrial wall. There is evidence of cardiac tamponade. Venous: The inferior vena cava is normal in size with less than 50% respiratory variability, suggesting right atrial pressure of 8 mmHg. Lyman Bishop Fields Electronically signed by Lyman Bishop Fields Signature Date/Time: 10/02/2020/1:33:23 PM    Final      Assessment & Plan    Right ventricular perforation with extension into the pleural space; hemodynamic collapse and shock   Nonischemic cardiomyopathy   ICD-acutely paced St Jude   PVCs      OR findings> lead in L chest with screw in the intercostal artery; device and lead removed, and puncture repaired with evacuation of hematoma  PVCs are high frequency-- will get 12 lead to map them, presumably related to perforation, but axis from single lead suggests perhaps not, and ths a potential factor with her cardiomyopathy  Will need to readdress ICD at  later date   For questions or updates, please contact [place] Please consult [email or web address] for contact info under Cardiology/STEMI.      Signed, Virl Axe, Fields  10/03/2020, 7:57 AM

## 2020-10-03 NOTE — Progress Notes (Signed)
      [address]       [place] [address]             [phone]        CARDIOTHORACIC SURGERY PROGRESS NOTE   R1 Day Post-Op Procedure(s) (LRB): MEDIAN STERNOTOMY REPAIR PERFORATED VENTRICLE EVACUATION LEFT HEMOTHORAX (N/A) TRANSESOPHAGEAL ECHOCARDIOGRAM (TEE) (N/A) ICD LEAD REMOVAL (N/A) GENERATOR REMOVAL  Subjective: Expected soreness in chest.  Otherwise looks good  Objective: Vital signs: BP Readings from Last 1 Encounters:  10/03/20 131/89   Pulse Readings from Last 1 Encounters:  10/03/20 (!) 113   Resp Readings from Last 1 Encounters:  10/03/20 (!) 26   Temp Readings from Last 1 Encounters:  10/03/20 (!) 100.58 F (38.1 C)    Hemodynamics:    Physical Exam:  Rhythm:   Sinus w/ PVC's  Breath sounds: clear  Heart sounds:  RRR  Incisions:  Dressings dry, intact  Abdomen:  Soft, non-distended, non-tender  Extremities:  Warm, well-perfused  Chest tubes:  low volume thin serosanguinous output, no air leak    Intake/Output from previous day: 06/18 0701 - 06/19 0700 In: 6824.6 [I.V.:3992; KGSUP:1031; IV Piggyback:764.6] Out: 5945 [Urine:2275; Blood:2200; Chest Tube:590] Intake/Output this shift: Total I/O In: 117.6 [I.V.:31.8; IV Piggyback:85.8] Out: 1250 [Urine:1200; Chest Tube:50]  Lab Results:  CBC: Recent Labs    10/02/20 2245 10/03/20 0443  WBC 15.3* 19.2*  HGB 14.7 15.1*  HCT 42.7 43.3  PLT 128* 145*    BMET:  Recent Labs    10/02/20 2245 10/03/20 0443  NA 136 135  K 3.9 3.7  CL 107 105  CO2 25 24  GLUCOSE 136* 146*  BUN <5* <5*  CREATININE 0.46 0.50  CALCIUM 7.0* 7.5*     PT/INR:   Recent Labs    10/02/20 1640  LABPROT 17.3*  INR 1.4*    CBG (last 3)  Recent Labs    10/03/20 0317 10/03/20 0356 10/03/20 0443  GLUCAP 152* 139* 131*    ABG    Component Value Date/Time   PHART 7.285 (L) 10/02/2020 2213   PCO2ART 51.0 (H) 10/02/2020 2213   PO2ART 138 (H) 10/02/2020 2213   HCO3 24.5  10/02/2020 2213   TCO2 26 10/02/2020 2213   ACIDBASEDEF 3.0 (H) 10/02/2020 2213   O2SAT 99.0 10/02/2020 2213    CXR: Bibasilar atelectasis L>R  Assessment/Plan: S/P Procedure(s) (LRB): MEDIAN STERNOTOMY REPAIR PERFORATED VENTRICLE EVACUATION LEFT HEMOTHORAX (N/A) TRANSESOPHAGEAL ECHOCARDIOGRAM (TEE) (N/A) ICD LEAD REMOVAL (N/A) GENERATOR REMOVAL  Overall doing well POD1 Maintaining NSR - sinus tach w/ PVC's, stable although somewhat elevated BP, no drips Breathing comfortably w/ O2 sats 98% on nasal cannula, CXR looks okay Chronic systolic CHF with expected post-op volume excess, weight up 8 kg  Restart carvedilol Continue to hold Entresto for now Lasix to stimulate diuresis Mobilize D/C tubes later today or tomorrow, depending on output Transition off insulin drip     Rexene Alberts, MD 10/03/2020 10:32 AM

## 2020-10-03 NOTE — ED Notes (Signed)
On way to OR with pt, in elevator, BP 83/56, levophed titrated to 41mg/min at this time.

## 2020-10-04 ENCOUNTER — Encounter (HOSPITAL_COMMUNITY): Payer: Self-pay | Admitting: Cardiology

## 2020-10-04 ENCOUNTER — Telehealth: Payer: Self-pay

## 2020-10-04 ENCOUNTER — Inpatient Hospital Stay (HOSPITAL_COMMUNITY): Payer: No Typology Code available for payment source

## 2020-10-04 DIAGNOSIS — I493 Ventricular premature depolarization: Secondary | ICD-10-CM

## 2020-10-04 DIAGNOSIS — S2699XA Other injury of heart, unspecified with or without hemopericardium, initial encounter: Secondary | ICD-10-CM | POA: Diagnosis not present

## 2020-10-04 DIAGNOSIS — I428 Other cardiomyopathies: Secondary | ICD-10-CM | POA: Diagnosis not present

## 2020-10-04 DIAGNOSIS — I9789 Other postprocedural complications and disorders of the circulatory system, not elsewhere classified: Secondary | ICD-10-CM | POA: Diagnosis not present

## 2020-10-04 LAB — CBC
HCT: 35.5 % — ABNORMAL LOW (ref 36.0–46.0)
Hemoglobin: 12 g/dL (ref 12.0–15.0)
MCH: 30.5 pg (ref 26.0–34.0)
MCHC: 33.8 g/dL (ref 30.0–36.0)
MCV: 90.1 fL (ref 80.0–100.0)
Platelets: 141 10*3/uL — ABNORMAL LOW (ref 150–400)
RBC: 3.94 MIL/uL (ref 3.87–5.11)
RDW: 13.3 % (ref 11.5–15.5)
WBC: 14.9 10*3/uL — ABNORMAL HIGH (ref 4.0–10.5)
nRBC: 0 % (ref 0.0–0.2)

## 2020-10-04 LAB — GLUCOSE, CAPILLARY
Glucose-Capillary: 120 mg/dL — ABNORMAL HIGH (ref 70–99)
Glucose-Capillary: 129 mg/dL — ABNORMAL HIGH (ref 70–99)
Glucose-Capillary: 150 mg/dL — ABNORMAL HIGH (ref 70–99)
Glucose-Capillary: 145 mg/dL — ABNORMAL HIGH (ref 70–99)
Glucose-Capillary: 114 mg/dL — ABNORMAL HIGH (ref 70–99)
Glucose-Capillary: 118 mg/dL — ABNORMAL HIGH (ref 70–99)
Glucose-Capillary: 150 mg/dL — ABNORMAL HIGH (ref 70–99)
Glucose-Capillary: 125 mg/dL — ABNORMAL HIGH (ref 70–99)
Glucose-Capillary: 132 mg/dL — ABNORMAL HIGH (ref 70–99)
Glucose-Capillary: 194 mg/dL — ABNORMAL HIGH (ref 70–99)
Glucose-Capillary: 162 mg/dL — ABNORMAL HIGH (ref 70–99)
Glucose-Capillary: 144 mg/dL — ABNORMAL HIGH (ref 70–99)

## 2020-10-04 LAB — BASIC METABOLIC PANEL
Anion gap: 8 (ref 5–15)
BUN: <5 mg/dL — ABNORMAL LOW (ref 6–20)
CO2: 28 mmol/L (ref 22–32)
Calcium: 8.5 mg/dL — ABNORMAL LOW (ref 8.9–10.3)
Chloride: 97 mmol/L — ABNORMAL LOW (ref 98–111)
Creatinine, Ser: 0.47 mg/dL (ref 0.44–1.00)
GFR, Estimated: >60 mL/min (ref >60)
Glucose, Bld: 120 mg/dL — ABNORMAL HIGH (ref 70–99)
Potassium: 4 mmol/L (ref 3.5–5.1)
Sodium: 133 mmol/L — ABNORMAL LOW (ref 135–145)

## 2020-10-04 LAB — MAGNESIUM: Magnesium: 2 mg/dL (ref 1.7–2.4)

## 2020-10-04 MED ORDER — POTASSIUM CHLORIDE CRYS ER 20 MEQ PO TBCR
20.0000 meq | EXTENDED_RELEASE_TABLET | Freq: Two times a day (BID) | ORAL | Status: DC
  Administered 2020-10-04 – 2020-10-07 (×7): 20 meq via ORAL
  Filled 2020-10-04 (×7): qty 1

## 2020-10-04 MED ORDER — SACUBITRIL-VALSARTAN 49-51 MG PO TABS
1.0000 | ORAL_TABLET | Freq: Two times a day (BID) | ORAL | Status: DC
  Administered 2020-10-04 – 2020-10-07 (×7): 1 via ORAL
  Filled 2020-10-04 (×7): qty 1

## 2020-10-04 MED ORDER — ~~LOC~~ CARDIAC SURGERY, PATIENT & FAMILY EDUCATION: Freq: Once | Status: AC

## 2020-10-04 MED ORDER — FUROSEMIDE 10 MG/ML IJ SOLN
40.0000 mg | Freq: Two times a day (BID) | INTRAMUSCULAR | Status: AC
  Administered 2020-10-04 (×2): 40 mg via INTRAVENOUS
  Filled 2020-10-04 (×2): qty 4

## 2020-10-04 MED ORDER — CYCLOBENZAPRINE HCL 10 MG PO TABS
5.0000 mg | ORAL_TABLET | Freq: Three times a day (TID) | ORAL | Status: DC | PRN
  Filled 2020-10-04: qty 1

## 2020-10-04 MED ORDER — FUROSEMIDE 40 MG PO TABS
40.0000 mg | ORAL_TABLET | Freq: Every day | ORAL | Status: DC
  Administered 2020-10-05 – 2020-10-07 (×3): 40 mg via ORAL
  Filled 2020-10-04 (×3): qty 1

## 2020-10-04 MED FILL — Heparin Sod (Porcine)-NaCl IV Soln 1000 Unit/500ML-0.9%: INTRAVENOUS | Qty: 500 | Status: AC

## 2020-10-04 NOTE — Telephone Encounter (Signed)
Please see Consult note from Dr. Curt Bears 10/04/20.

## 2020-10-04 NOTE — Telephone Encounter (Signed)
-----   Message from [place], [place] sent at 10/01/2020  5:03 PM EDT ----- SJM ICD  Same day d/c  WC

## 2020-10-04 NOTE — Progress Notes (Addendum)
[facility] DAILY ICU PROGRESS NOTE                   [address]            [place] [address]          [phone]   2 Days Post-Op Procedure(s) (LRB): MEDIAN STERNOTOMY REPAIR PERFORATED VENTRICLE EVACUATION LEFT HEMOTHORAX (N/A) TRANSESOPHAGEAL ECHOCARDIOGRAM (TEE) (N/A) ICD LEAD REMOVAL (N/A) GENERATOR REMOVAL  Total Length of Stay:  LOS: 2 days   Subjective: Awake and alert, no new concerns.  Chest tubes removed this AM.    Objective: Vital signs in last 24 hours: Temp:  [97.7 F (36.5 C)-100.04 F (37.8 C)] 97.7 F (36.5 C) (06/20 0345) Pulse Rate:  [47-112] 93 (06/20 0800) Cardiac Rhythm: Normal sinus rhythm (06/20 0800) Resp:  [17-29] 24 (06/20 0800) BP: (97-156)/(53-98) 147/72 (06/20 0800) SpO2:  [94 %-100 %] 94 % (06/20 0901) Weight:  [77.2 kg] 77.2 kg (06/20 0500)  Filed Weights   10/03/20 0500 10/04/20 0500  Weight: 79 kg 77.2 kg    Weight change: -1.8 kg   Hemodynamic parameters for last 24 hours:    Intake/Output from previous day: 06/19 0701 - 06/20 0700 In: 374 [I.V.:94.1; IV Piggyback:279.9] Out: 3935 [Urine:3535; Chest Tube:400]  Intake/Output this shift: Total I/O In: -  Out: 10 [Chest Tube:10]  Current Meds: Scheduled Meds:  acetaminophen  1,000 mg Oral Q6H   aspirin EC  81 mg Oral Daily   bisacodyl  10 mg Oral Daily   Or   bisacodyl  10 mg Rectal Daily   carvedilol  12.5 mg Oral BID   Chlorhexidine Gluconate Cloth  6 each Topical Daily   [facility], Patient [facility] Family Education   Does not apply Once   docusate sodium  100 mg Oral BID   enoxaparin (LOVENOX) injection  40 mg Subcutaneous QHS   furosemide  40 mg Intravenous BID   [START ON 10/05/2020] furosemide  40 mg Oral Daily   hyoscyamine  0.375 mg Oral BID   levothyroxine  75 mcg Oral QAC breakfast   lidocaine  1 patch Transdermal Q24H   mometasone-formoterol  2 puff Inhalation BID   montelukast  10 mg Oral QHS   pantoprazole (PROTONIX) IV  40  mg Intravenous Daily   polyethylene glycol  17 g Per Tube Daily   potassium chloride  20 mEq Oral BID   rosuvastatin  20 mg Oral QHS   sacubitril-valsartan  1 tablet Oral BID   sodium chloride flush  10-40 mL Intracatheter Q12H   sodium chloride flush  3 mL Intravenous Q12H   spironolactone  12.5 mg Oral Daily   venlafaxine XR  150 mg Oral Q breakfast   Continuous Infusions:  sodium chloride     lactated ringers     PRN Meds:.cyclobenzaprine, dextrose, fentaNYL (SUBLIMAZE) injection, metoprolol tartrate, ondansetron (ZOFRAN) IV, sodium chloride flush, sodium chloride flush  General appearance: alert, cooperative, and mild distress Neurologic: intact Heart: SR with moderate PVC's Lungs: breath sounds are clear.  Wound: the sternotomy incision is covered with a dry dressing  Lab Results: CBC: Recent Labs    10/03/20 1613 10/04/20 0332  WBC 16.9* 14.9*  HGB 14.0 12.0  HCT 41.1 35.5*  PLT 135* 141*   BMET:  Recent Labs    10/03/20 1613 10/04/20 0332  NA 133* 133*  K 3.8 4.0  CL 101 97*  CO2 27 28  GLUCOSE 129* 120*  BUN <5* <  5*  CREATININE 0.48 0.47  CALCIUM 7.9* 8.5*    CMET: Lab Results  Component Value Date   WBC 14.9 (H) 10/04/2020   HGB 12.0 10/04/2020   HCT 35.5 (L) 10/04/2020   PLT 141 (L) 10/04/2020   GLUCOSE 120 (H) 10/04/2020   CHOL 128 08/10/2020   TRIG 153 (H) 08/10/2020   HDL 37 (L) 08/10/2020   LDLDIRECT 101.0 12/02/2018   LDLCALC 65 08/10/2020   ALT 14 10/02/2020   AST 15 10/02/2020   NA 133 (L) 10/04/2020   K 4.0 10/04/2020   CL 97 (L) 10/04/2020   CREATININE 0.47 10/04/2020   BUN <5 (L) 10/04/2020   CO2 28 10/04/2020   TSH 0.31 (L) 09/27/2020   INR 1.4 (H) 10/02/2020   HGBA1C 6.3 09/27/2020      PT/INR:  Recent Labs    10/02/20 1640  LABPROT 17.3*  INR 1.4*   Radiology: DG Chest Port 1 View  Result Date: 10/04/2020 CLINICAL DATA:  Chest tubes, post open heart surgery, sore chest EXAM: PORTABLE CHEST 1 VIEW COMPARISON:   Portable exam 0514 hours compared to 10/03/2020 FINDINGS: Mediastinal drains and LEFT thoracostomy tube present. RIGHT jugular central venous catheter with tip projecting over SVC. Enlargement of cardiac silhouette. Mediastinal contours and pulmonary vascularity normal. Decreased lung volumes with bibasilar atelectasis. Small LEFT apex pneumothorax. Osseous structures unremarkable. IMPRESSION: Decreased lung volumes with bibasilar atelectasis. Small LEFT apex pneumothorax despite thoracostomy tube. Electronically Signed   By: Lavonia Dana M.D.   On: 10/04/2020 08:42     Assessment/Plan: S/P Procedure(s) (LRB): MEDIAN STERNOTOMY REPAIR PERFORATED VENTRICLE EVACUATION LEFT HEMOTHORAX (N/A) TRANSESOPHAGEAL ECHOCARDIOGRAM (TEE) (N/A) ICD LEAD REMOVAL (N/A) GENERATOR REMOVAL  -POD3 emergency sternotomy for evacuation of left hemothorax, repair of RV perforation and repair of laceration of left 5th intercostal artery.  CT drainage tapered off and the CT's were removed earlier this morning. Plan to transfer to 4E today, moblize.   -H/O non-ischemic cardiomyopathy, EF 25%- the advanced HF team is following and managing medications. On Entresto, spiro, and carvedilol.   -Expected acute blood loss anemia- mild. Monitor.    Antony Odea, PA-C [phone] 10/04/2020 11:12 AM   Chart reviewed, patient examined, agree with above.

## 2020-10-04 NOTE — Progress Notes (Signed)
Cardiology Progress Note  Patient ID: Erin Fields MRN: [id number] DOB: [birth year] Date of Encounter: 10/04/2020  Primary Cardiologist: Jenne Campus, MD  Subjective   Chief Complaint: Pain with breathing  HPI: Reports difficulty with deep inspiration.  Telemetry with sinus rhythm and frequent PVCs.  Net -3.5 L overnight.  She appears euvolemic.  Chest tube still in place.  ROS:  All other ROS reviewed and negative. Pertinent positives noted in the HPI.     Inpatient Medications  Scheduled Meds:  acetaminophen  1,000 mg Oral Q6H   aspirin EC  81 mg Oral Daily   bisacodyl  10 mg Oral Daily   Or   bisacodyl  10 mg Rectal Daily   carvedilol  12.5 mg Oral BID   Chlorhexidine Gluconate Cloth  6 each Topical Daily   docusate sodium  100 mg Oral BID   enoxaparin (LOVENOX) injection  40 mg Subcutaneous QHS   hyoscyamine  0.375 mg Oral BID   insulin aspart  0-24 Units Subcutaneous Q4H   levothyroxine  75 mcg Oral QAC breakfast   lidocaine  1 patch Transdermal Q24H   mometasone-formoterol  2 puff Inhalation BID   montelukast  10 mg Oral QHS   pantoprazole (PROTONIX) IV  40 mg Intravenous Daily   polyethylene glycol  17 g Per Tube Daily   rosuvastatin  20 mg Oral QHS   sodium chloride flush  10-40 mL Intracatheter Q12H   sodium chloride flush  3 mL Intravenous Q12H   spironolactone  12.5 mg Oral Daily   venlafaxine XR  150 mg Oral Q breakfast   Continuous Infusions:  sodium chloride     lactated ringers     lactated ringers Stopped (10/03/20 1603)   PRN Meds: dextrose, fentaNYL (SUBLIMAZE) injection, metoprolol tartrate, ondansetron (ZOFRAN) IV, sodium chloride flush, sodium chloride flush   Vital Signs   Vitals:   10/04/20 0615 10/04/20 0630 10/04/20 0645 10/04/20 0700  BP: (!) 150/95 (!) 110/96 137/77 132/85  Pulse: (!) 104 (!) 102 99 95  Resp: (!) 27 (!) 25 (!) 27 (!) 23  Temp:      TempSrc:      SpO2: 99% 100% 97% 100%  Weight:        Intake/Output  Summary (Last 24 hours) at 10/04/2020 0725 Last data filed at 10/04/2020 0600 Gross per 24 hour  Intake 374.04 ml  Output 3935 ml  Net -3560.96 ml   Last 3 Weights 10/04/2020 10/03/2020 10/01/2020  Weight (lbs) 170 lb 3.1 oz 174 lb 2.6 oz 156 lb  Weight (kg) 77.2 kg 79 kg 70.761 kg      Telemetry  Overnight telemetry shows sinus rhythm heart rate in the 90s, frequent PVCs, which I personally reviewed.   ECG  The most recent ECG shows sinus tachycardia heart rate 105, PVCs which are inferiorly directed, which I personally reviewed.   Physical Exam   Vitals:   10/04/20 0615 10/04/20 0630 10/04/20 0645 10/04/20 0700  BP: (!) 150/95 (!) 110/96 137/77 132/85  Pulse: (!) 104 (!) 102 99 95  Resp: (!) 27 (!) 25 (!) 27 (!) 23  Temp:      TempSrc:      SpO2: 99% 100% 97% 100%  Weight:        Intake/Output Summary (Last 24 hours) at 10/04/2020 0725 Last data filed at 10/04/2020 0600 Gross per 24 hour  Intake 374.04 ml  Output 3935 ml  Net -3560.96 ml    Last 3 Weights 10/04/2020 10/03/2020  10/01/2020  Weight (lbs) 170 lb 3.1 oz 174 lb 2.6 oz 156 lb  Weight (kg) 77.2 kg 79 kg 70.761 kg    Body mass index is 29.68 kg/m.  General: Well nourished, well developed, in no acute distress Head: Atraumatic, normal size  Eyes: PEERLA, EOMI  Neck: Supple, no JVD Endocrine: No thryomegaly Cardiac: Normal S1, S2; RRR; no murmurs, rubs, or gallops Lungs: Diminished breath sounds bilaterally Abd: Soft, nontender, no hepatomegaly  Ext: No edema, pulses 2+ Musculoskeletal: No deformities, BUE and BLE strength normal and equal Skin: Warm and dry, no rashes   Neuro: Alert and oriented to person, place, time, and situation, CNII-XII grossly intact, no focal deficits  Psych: Normal mood and affect   Labs  High Sensitivity Troponin:   Recent Labs  Lab 10/02/20 1139  TROPONINIHS 121*     Cardiac EnzymesNo results for input(s): TROPONINI in the last 168 hours. No results for input(s): TROPIPOC in  the last 168 hours.  Chemistry Recent Labs  Lab 10/01/20 2038 10/02/20 1139 10/02/20 1149 10/03/20 0443 10/03/20 1613 10/04/20 0332  NA 135 130*   < > 135 133* 133*  K 3.7 4.0   < > 3.7 3.8 4.0  CL 103 95*   < > 105 101 97*  CO2 23 23   < > 24 27 28   GLUCOSE 131* 328*   < > 146* 129* 120*  BUN 7 5*   < > <5* <5* <5*  CREATININE 0.56 0.81   < > 0.50 0.48 0.47  CALCIUM 9.2 9.4   < > 7.5* 7.9* 8.5*  PROT 6.0* 6.5  --   --   --   --   ALBUMIN 3.8 4.0  --   --   --   --   AST 16 15  --   --   --   --   ALT 16 14  --   --   --   --   ALKPHOS 60 58  --   --   --   --   BILITOT 0.8 0.9  --   --   --   --   GFRNONAA >60 >60   < > >60 >60 >60  ANIONGAP 9 12   < > 6 5 8    < > = values in this interval not displayed.    Hematology Recent Labs  Lab 10/03/20 0443 10/03/20 1613 10/04/20 0332  WBC 19.2* 16.9* 14.9*  RBC 4.82 4.56 3.94  HGB 15.1* 14.0 12.0  HCT 43.3 41.1 35.5*  MCV 89.8 90.1 90.1  MCH 31.3 30.7 30.5  MCHC 34.9 34.1 33.8  RDW 13.4 13.4 13.3  PLT 145* 135* 141*   BNPNo results for input(s): BNP, PROBNP in the last 168 hours.  DDimer No results for input(s): DDIMER in the last 168 hours.   Radiology  CT Chest Wo Contrast  Result Date: 10/02/2020 CLINICAL DATA:  Recent ICD implantation. Patient complains of chest pain and weakness. Chest radiograph demonstrates a large new left pleural effusion. In addition, concern for abnormal position the ICD lead. EXAM: CT CHEST WITHOUT CONTRAST TECHNIQUE: Multidetector CT imaging of the chest was performed following the standard protocol without IV contrast. COMPARISON:  Chest radiograph 10/02/2020 FINDINGS: Cardiovascular: Left chest single lead ICD. Left subclavian lead extends down the SVC into the right heart and the tip is lateral to the right ventricle and suggestive for a cardiac perforation. Heart and mediastinum are slightly shifted towards the right due to  the left pleural fluid and left lung densities. Evidence for  coronary artery calcifications. No significant pericardial effusion. Mediastinum/Nodes: Mediastinal is shifted towards the right side of the chest. No significant lymph node enlargement in the chest. Surgical clips in the right axilla. Lungs/Pleura: Extensive volume loss in the left lung related to a large pleural effusion and bulky hyperdense areas along the periphery of the lung. Lung findings are suggestive for hemorrhage with a hemothorax. Patchy airspace densities in the aerated left lung could be related to atelectasis or hemorrhage. Right lung is clear except for atelectasis along the medial right upper lobe. No significant right pleural fluid. Upper Abdomen: Evidence for a high-density tablet in the distal stomach region. No acute abnormality in the upper abdomen. Musculoskeletal: Soft tissue edema and small amount of gas in the left chest compatible with recent ICD placement. No acute bone abnormality. IMPRESSION: 1. Right ventricular ICD lead perforation. ICD lead is located left of the right ventricle and appears to be outside of the heart. Large amount of high-density material within the left lung is compatible with hemorrhage or hematoma. New left pleural effusion is compatible with a hemothorax. 2. Mild mediastinal shift towards the right due to the blood products in the left hemithorax. Critical Value/emergent results were called by telephone at the time of interpretation on 10/02/2020 at 1:19 pm to provider Jolyn Nap, MD, who verbally acknowledged these results. Electronically Signed   By: Markus Daft M.D.   On: 10/02/2020 13:40   DG Chest Port 1 View  Result Date: 10/03/2020 CLINICAL DATA:  Status post evacuation of hematoma. EXAM: PORTABLE CHEST 1 VIEW COMPARISON:  Prior chest radiographs 10/02/2020 and earlier. FINDINGS: Previously demonstrated ET tube no longer appreciated. Unchanged position of a right IJ approach central venous catheter with tip projecting at the level of the lower SVC.  Unchanged position of 3 left-sided chest tubes. The cardiomediastinal silhouette is unchanged. Low lung volumes. Persistent mild left pleural thickening and left basilar atelectasis. No consolidation within the right lung. No evidence of pneumothorax. No acute bony abnormality identified. IMPRESSION: ET tube no longer present. Unchanged position of a right IJ approach central venous catheter and 3 left-sided chest tubes. Persistent mild left pleural thickening and left basilar atelectasis. The right lung remains clear. Low lung volumes. Electronically Signed   By: Kellie Simmering DO   On: 10/03/2020 10:56   DG Chest Port 1 View  Result Date: 10/02/2020 CLINICAL DATA:  Post evacuation of hematoma EXAM: PORTABLE CHEST 1 VIEW COMPARISON:  10/02/2020 FINDINGS: Endotracheal tube in good position. Three chest tubes on the left with marked improvement in left effusion. There remains a small amount of pleural thickening and left lower lobe atelectasis which has improved. Right lung clear Right jugular central venous catheter tip in the SVC. No pneumothorax. IMPRESSION: Three chest tubes in the left chest with decrease in left pleural effusion and improved aeration left lung base. No pneumothorax. Electronically Signed   By: Franchot Gallo M.D.   On: 10/02/2020 16:54   DG Chest Port 1 View  Result Date: 10/02/2020 CLINICAL DATA:  Weakness EXAM: PORTABLE CHEST 1 VIEW COMPARISON:  10/01/2020 FINDINGS: Single lead left-sided implanted cardiac device. The position of the distal lead is now more superiorly oriented compared to the recent previous radiograph. Difficult to determine the exact location given lack of orthogonal view. Heart size within normal limits. Interval development of a large left-sided pleural effusion. Left basilar opacity, likely compressive atelectasis. Right lung remains clear. No pneumothorax  is seen. IMPRESSION: 1. Interval development of large left-sided pleural effusion. 2. The position of the  different later lead is now more superiorly oriented compared to the recent previous radiograph. Difficult to determine the exact location given lack of orthogonal view. Consider follow-up with two view chest x-ray or CT of the chest for further evaluation. These results were discussed by telephone at the time of interpretation on 10/02/2020 at 12:35 pm with provider Dr Caryl Comes, who verbally acknowledged these results. Electronically Signed   By: Davina Poke D.O.   On: 10/02/2020 12:35   ECHO INTRAOPERATIVE TEE (ANESTHESIA ONLY)  Result Date: 10/02/2020  *INTRAOPERATIVE TRANSESOPHAGEAL REPORT *  Patient Name:   Erin Fields Date of Exam: 10/02/2020 Medical Rec #:  [id number]         Height:       63.5 in Accession #:    [id number]        Weight:       156.0 lb Date of Birth:  December 23, 1965          BSA:          1.75 m Patient Age:    80 years          BP:           88/70 mmHg Patient Gender: F                 HR:           120 bpm. Exam Location:  Inpatient Transesophogeal exam was perform intraoperatively during surgical procedure. Patient was closely monitored under general anesthesia during the entirety of examination. Indications:     perforation of lead into chest. emergency surgery. Performing Phys: [id number] DAVID JOSLIN Diagnosing Phys: Roberts Gaudy MD Complications: No known complications during this procedure. PRE-OP FINDINGS  Left Ventricle: The patient was on high dose norepinephrine and the heart rate was 135. The LV was contracting vigorously and the ejection fraction was estiamted at 60-65% using the 2D Simpson's method in the 4 and 2 chamber views. The LV wall thickness  was normal. Right Ventricle: The right ventricle has normal systolic function. The cavity was normal. There was an AICD wire seen entering the right atrium from the superior vena cava and crossing the tricuspid valve into the right ventricle. Left Atrium: No left atrial/left atrial appendage thrombus was detected. The LA cavity was  enlarged and measured 4.4 cm in the media-lateral dimension. Interatrial Septum: No atrial level shunt detected by color flow Doppler. There is no evidence of a patent foramen ovale. Pericardium: There is a large pleural effusion in the left lateral region. There was no pericardial fluid seen. Mitral Valve: Mitral valve regurgitation is trivial by color flow Doppler. There is No evidence of mitral stenosis. Tricuspid Valve: The tricuspid valve was normal in structure. Tricuspid valve regurgitation is trivial by color flow Doppler. No evidence of tricuspid stenosis is present. Aortic Valve: The aortic valve is tricuspid Aortic valve regurgitation was not visualized by color flow Doppler. There is no stenosis of the aortic valve. The aortic valve leaflets were mildly thickened but opened normally without restriction. Pulmonic Valve: The pulmonic valve was normal in structure, with normal. No evidence of pumonic stenosis. Pulmonic valve regurgitation is not visualized by color flow Doppler. Shunts: There is no evidence of an atrial septal defect.  Roberts Gaudy MD Electronically signed by Roberts Gaudy MD Signature Date/Time: 10/02/2020/8:30:17 PM    Final    ECHOCARDIOGRAM LIMITED  Result Date: 10/02/2020  ECHOCARDIOGRAM LIMITED REPORT   Patient Name:   Erin Fields Date of Exam: 10/02/2020 Medical Rec #:  [id number]         Height:       63.5 in Accession #:    [id number]        Weight:       156.0 lb Date of Birth:  April 06, 1966          BSA:          1.750 m Patient Age:    34 years          BP:           76/56 mmHg Patient Gender: F                 HR:           121 bpm. Exam Location:  Inpatient Procedure: Limited Echo STAT ECHO Indications:    Pericardial Effusion  History:        Patient has prior history of Echocardiogram examinations, most                 recent 07/23/2020. CHF, CAD; Signs/Symptoms:Chest Pain. AICD                 implanted on 10/01/2020.  Sonographer:    Darlina Sicilian RDCS Referring Phys:  [place]  1. Left ventricular ejection fraction, by estimation, is 30 to 35%. The left ventricle has moderately decreased function.  2. Diastolic RV collapse is noted. There is a moderate-sized echogenic fluid collection overlying the RV, suggestive of hemopericardium.. Right ventricular systolic function is hyperdynamic. The right ventricular size is small.  3. Echogenic fluid collection over the RV concering for hemopericardium. Tamponade physiology is suspected given hypotension, RA/RV collapse, non-dilated IVC but it does not collapse. Moderate pericardial effusion. The pericardial effusion is localized near the right ventricle and surrounding the apex. Findings are consistent with possible cardiac tamponade.  4. The inferior vena cava is normal in size with <50% respiratory variability, suggesting right atrial pressure of 8 mmHg. Comparison(s): Changes from prior study are noted. 07/23/2020: LVEF 20-25%, no pericardial effusion, IVC normal. Conclusion(s)/Recommendation(s): Critical findings reported to Dr. Caryl Comes and acknowledged at 1:25 pm on 10/02/2020. He reported a CT scan demonstrated a perforated ICD lead - CT surgery is aware. FINDINGS  Left Ventricle: Left ventricular ejection fraction, by estimation, is 30 to 35%. The left ventricle has moderately decreased function. Right Ventricle: Diastolic RV collapse is noted. There is a modearate-sized echogenic fluid collection overlying the RV, suggestive of hemopericardium. The right ventricular size is small. No increase in right ventricular wall thickness. Right ventricular systolic function is hyperdynamic. Pericardium: Echogenic fluid collection over the RV concering for hemopericardium. Tamponade physiology is suspected given hypotension, RA/RV collapse, non-dilated IVC but it does not collapse. A moderately sized pericardial effusion is present. The pericardial effusion is localized near the right ventricle and surrounding the apex.  There is diastolic collapse of the right ventricular free wall and diastolic collapse of the right atrial wall. There is evidence of cardiac tamponade. Venous: The inferior vena cava is normal in size with less than 50% respiratory variability, suggesting right atrial pressure of 8 mmHg. Lyman Bishop MD Electronically signed by Lyman Bishop MD Signature Date/Time: 10/02/2020/1:33:23 PM    Final     Cardiac Studies  TTE 10/02/2020   1. Left ventricular ejection fraction, by estimation, is 30 to 35%. The  left ventricle has moderately decreased  function.   2. Diastolic RV collapse is noted. There is a moderate-sized echogenic  fluid collection overlying the RV, suggestive of hemopericardium.. Right  ventricular systolic function is hyperdynamic. The right ventricular size  is small.   3. Echogenic fluid collection over the RV concering for hemopericardium.  Tamponade physiology is suspected given hypotension, RA/RV collapse,  non-dilated IVC but it does not collapse. Moderate pericardial effusion.  The pericardial effusion is localized  near the right ventricle and surrounding the apex. Findings are consistent  with possible cardiac tamponade.   4. The inferior vena cava is normal in size with <50% respiratory  variability, suggesting right atrial pressure of 8 mmHg.   LHC 06/21/2020  Mild to moderate, non-obstructive coronary artery disease. Normal left and right heart filling pressures. Mildly reduced Fick cardiac output/index.   Patient Profile  Erin Fields is a 55 y.o. female with nonobstructive CAD, nonischemic cardiomyopathy, hypertension, hyperlipidemia who was admitted on 10/01/2020 with RV perforation after ICD implantation.  Course has been complicated by hemorrhagic shock as well as left hemothorax.  Status postsurgical repair.  Assessment & Plan   RV perforation/ICD implantation/left hemothorax/status post surgical repair -Status post surgical repair of RV perforation.   Appears to be doing well.  Chest tube still in place. -Management per surgery.  Hopefully chest tube will come out in the next day or 2. -She was given Lasix.  Net -3.5 L.  Volume status appears acceptable. -Hopefully she will transition to the floor once her chest tube has been discontinued.  2.  Nonischemic cardiomyopathy, EF 25% -Continue home Coreg and Aldactone.  Surgery has requested to hold Entresto.  This can be restarted when she is more stable. -Volume status acceptable. -We will work with you to restart her home CHF medications in the next few days.  No rush to do this. -Unclear if she will want a repeat attempt at ICD implantation.  3.  Frequent PVCs -Inferiorly directed.  Could be related to RV perforation. -For now would recommend to monitor these.  Continue beta-blocker. -If they continue we could consider amiodarone.  4.  Nonobstructive CAD -Continue statin. -Continue aspirin 81 mg daily.  For questions or updates, please contact [facility] Please consult [email or web address] for contact info under   Time Spent with Patient: I have spent a total of 25 minutes with patient reviewing hospital notes, telemetry, EKGs, labs and examining the patient as well as establishing an assessment and plan that was discussed with the patient.  > 50% of time was spent in direct patient care.    Signed, Addison Naegeli. Audie Box, MD, [place]  10/04/2020 7:25 AM

## 2020-10-04 NOTE — Progress Notes (Signed)
Progress Note  Patient Name: [name] Date of Encounter: 10/04/2020  Primary Cardiologist: Jenne Campus, MD     Patient Profile     55 y.o. female admitted 6/18 for pain hemothorax and shock 2 d following ICD implant with evidence of extracardiac perforation of ICD lead.  At repair-- sternotomy and puncture  lead was in L chest  Subjective   Feeling improved but continues to be sore.   Inpatient Medications    Scheduled Meds:  acetaminophen  1,000 mg Oral Q6H   aspirin EC  81 mg Oral Daily   bisacodyl  10 mg Oral Daily   Or   bisacodyl  10 mg Rectal Daily   carvedilol  12.5 mg Oral BID   Chlorhexidine Gluconate Cloth  6 each Topical Daily   docusate sodium  100 mg Oral BID   enoxaparin (LOVENOX) injection  40 mg Subcutaneous QHS   furosemide  40 mg Intravenous BID   [START ON 10/05/2020] furosemide  40 mg Oral Daily   hyoscyamine  0.375 mg Oral BID   levothyroxine  75 mcg Oral QAC breakfast   lidocaine  1 patch Transdermal Q24H   mometasone-formoterol  2 puff Inhalation BID   montelukast  10 mg Oral QHS   pantoprazole (PROTONIX) IV  40 mg Intravenous Daily   polyethylene glycol  17 g Per Tube Daily   potassium chloride  20 mEq Oral BID   rosuvastatin  20 mg Oral QHS   sacubitril-valsartan  1 tablet Oral BID   sodium chloride flush  10-40 mL Intracatheter Q12H   sodium chloride flush  3 mL Intravenous Q12H   spironolactone  12.5 mg Oral Daily   venlafaxine XR  150 mg Oral Q breakfast   Continuous Infusions:  sodium chloride     lactated ringers     PRN Meds: cyclobenzaprine, dextrose, fentaNYL (SUBLIMAZE) injection, metoprolol tartrate, ondansetron (ZOFRAN) IV, sodium chloride flush, sodium chloride flush   Vital Signs    Vitals:   10/04/20 1000 10/04/20 1100 10/04/20 1200 10/04/20 1300  BP: (!) 143/86 118/76 110/69 111/68  Pulse: (!) 111 (!) 103  98  Resp: (!) 22 20 (!) 25 (!) 21  Temp:  98 F (36.7 C)    TempSrc:  Oral    SpO2: 93% 97%   100%  Weight:        Intake/Output Summary (Last 24 hours) at 10/04/2020 1310 Last data filed at 10/04/2020 1200 Gross per 24 hour  Intake 161.31 ml  Output 4130 ml  Net -3968.69 ml    Filed Weights   10/03/20 0500 10/04/20 0500  Weight: 79 kg 77.2 kg    Telemetry    Sinus rhythm with PVCs-personally reviewed  ECG    Sinus rhythm, occasional PVCs-personally reviewed  Physical Exam    GEN: Well nourished, well developed, in no acute distress  HEENT: normal  Neck: no JVD, carotid bruits, or masses Cardiac: RRR; no murmurs, rubs, or gallops,no edema  Respiratory:  clear to auscultation bilaterally, normal work of breathing GI: soft, nontender, nondistended, + BS MS: no deformity or atrophy  Skin: warm and dry Neuro:  Strength and sensation are intact Psych: euthymic mood, full affect   Labs    Chemistry Recent Labs  Lab 10/01/20 2038 10/02/20 1139 10/02/20 1149 10/03/20 0443 10/03/20 1613 10/04/20 0332  NA 135 130*   < > 135 133* 133*  K 3.7 4.0   < > 3.7 3.8 4.0  CL 103 95*   < >  105 101 97*  CO2 23 23   < > 24 27 28   GLUCOSE 131* 328*   < > 146* 129* 120*  BUN 7 5*   < > <5* <5* <5*  CREATININE 0.56 0.81   < > 0.50 0.48 0.47  CALCIUM 9.2 9.4   < > 7.5* 7.9* 8.5*  PROT 6.0* 6.5  --   --   --   --   ALBUMIN 3.8 4.0  --   --   --   --   AST 16 15  --   --   --   --   ALT 16 14  --   --   --   --   ALKPHOS 60 58  --   --   --   --   BILITOT 0.8 0.9  --   --   --   --   GFRNONAA >60 >60   < > >60 >60 >60  ANIONGAP 9 12   < > 6 5 8    < > = values in this interval not displayed.      Hematology Recent Labs  Lab 10/03/20 0443 10/03/20 1613 10/04/20 0332  WBC 19.2* 16.9* 14.9*  RBC 4.82 4.56 3.94  HGB 15.1* 14.0 12.0  HCT 43.3 41.1 35.5*  MCV 89.8 90.1 90.1  MCH 31.3 30.7 30.5  MCHC 34.9 34.1 33.8  RDW 13.4 13.4 13.3  PLT 145* 135* 141*     Cardiac EnzymesNo results for input(s): TROPONINI in the last 168 hours. No results for input(s):  TROPIPOC in the last 168 hours.   BNPNo results for input(s): BNP, PROBNP in the last 168 hours.   DDimer No results for input(s): DDIMER in the last 168 hours.   Radiology    CT Chest Wo Contrast  Result Date: 10/02/2020 CLINICAL DATA:  Recent ICD implantation. Patient complains of chest pain and weakness. Chest radiograph demonstrates a large new left pleural effusion. In addition, concern for abnormal position the ICD lead. EXAM: CT CHEST WITHOUT CONTRAST TECHNIQUE: Multidetector CT imaging of the chest was performed following the standard protocol without IV contrast. COMPARISON:  Chest radiograph 10/02/2020 FINDINGS: Cardiovascular: Left chest single lead ICD. Left subclavian lead extends down the SVC into the right heart and the tip is lateral to the right ventricle and suggestive for a cardiac perforation. Heart and mediastinum are slightly shifted towards the right due to the left pleural fluid and left lung densities. Evidence for coronary artery calcifications. No significant pericardial effusion. Mediastinum/Nodes: Mediastinal is shifted towards the right side of the chest. No significant lymph node enlargement in the chest. Surgical clips in the right axilla. Lungs/Pleura: Extensive volume loss in the left lung related to a large pleural effusion and bulky hyperdense areas along the periphery of the lung. Lung findings are suggestive for hemorrhage with a hemothorax. Patchy airspace densities in the aerated left lung could be related to atelectasis or hemorrhage. Right lung is clear except for atelectasis along the medial right upper lobe. No significant right pleural fluid. Upper Abdomen: Evidence for a high-density tablet in the distal stomach region. No acute abnormality in the upper abdomen. Musculoskeletal: Soft tissue edema and small amount of gas in the left chest compatible with recent ICD placement. No acute bone abnormality. IMPRESSION: 1. Right ventricular ICD lead perforation. ICD  lead is located left of the right ventricle and appears to be outside of the heart. Large amount of high-density material within the left lung is compatible  with hemorrhage or hematoma. New left pleural effusion is compatible with a hemothorax. 2. Mild mediastinal shift towards the right due to the blood products in the left hemithorax. Critical Value/emergent results were called by telephone at the time of interpretation on 10/02/2020 at 1:19 pm to provider Jolyn Nap, MD, who verbally acknowledged these results. Electronically Signed   By: Markus Daft M.D.   On: 10/02/2020 13:40   DG Chest Port 1 View  Result Date: 10/04/2020 CLINICAL DATA:  Chest tubes, post open heart surgery, sore chest EXAM: PORTABLE CHEST 1 VIEW COMPARISON:  Portable exam 0514 hours compared to 10/03/2020 FINDINGS: Mediastinal drains and LEFT thoracostomy tube present. RIGHT jugular central venous catheter with tip projecting over SVC. Enlargement of cardiac silhouette. Mediastinal contours and pulmonary vascularity normal. Decreased lung volumes with bibasilar atelectasis. Small LEFT apex pneumothorax. Osseous structures unremarkable. IMPRESSION: Decreased lung volumes with bibasilar atelectasis. Small LEFT apex pneumothorax despite thoracostomy tube. Electronically Signed   By: Lavonia Dana M.D.   On: 10/04/2020 08:42   DG Chest Port 1 View  Result Date: 10/03/2020 CLINICAL DATA:  Status post evacuation of hematoma. EXAM: PORTABLE CHEST 1 VIEW COMPARISON:  Prior chest radiographs 10/02/2020 and earlier. FINDINGS: Previously demonstrated ET tube no longer appreciated. Unchanged position of a right IJ approach central venous catheter with tip projecting at the level of the lower SVC. Unchanged position of 3 left-sided chest tubes. The cardiomediastinal silhouette is unchanged. Low lung volumes. Persistent mild left pleural thickening and left basilar atelectasis. No consolidation within the right lung. No evidence of pneumothorax. No  acute bony abnormality identified. IMPRESSION: ET tube no longer present. Unchanged position of a right IJ approach central venous catheter and 3 left-sided chest tubes. Persistent mild left pleural thickening and left basilar atelectasis. The right lung remains clear. Low lung volumes. Electronically Signed   By: Kellie Simmering DO   On: 10/03/2020 10:56   DG Chest Port 1 View  Result Date: 10/02/2020 CLINICAL DATA:  Post evacuation of hematoma EXAM: PORTABLE CHEST 1 VIEW COMPARISON:  10/02/2020 FINDINGS: Endotracheal tube in good position. Three chest tubes on the left with marked improvement in left effusion. There remains a small amount of pleural thickening and left lower lobe atelectasis which has improved. Right lung clear Right jugular central venous catheter tip in the SVC. No pneumothorax. IMPRESSION: Three chest tubes in the left chest with decrease in left pleural effusion and improved aeration left lung base. No pneumothorax. Electronically Signed   By: Franchot Gallo M.D.   On: 10/02/2020 16:54   ECHO INTRAOPERATIVE TEE (ANESTHESIA ONLY)  Result Date: 10/02/2020  *INTRAOPERATIVE TRANSESOPHAGEAL REPORT *  Patient Name:   VERONICA FRETZ Moffet Date of Exam: 10/02/2020 Medical Rec #:  [id number]         Height:       63.5 in Accession #:    [id number]        Weight:       156.0 lb Date of Birth:  1965-09-19          BSA:          1.75 m Patient Age:    77 years          BP:           88/70 mmHg Patient Gender: F                 HR:           120 bpm. Exam Location:  Inpatient  Transesophogeal exam was perform intraoperatively during surgical procedure. Patient was closely monitored under general anesthesia during the entirety of examination. Indications:     perforation of lead into chest. emergency surgery. Performing Phys: [id number] DAVID JOSLIN Diagnosing Phys: Roberts Gaudy MD Complications: No known complications during this procedure. PRE-OP FINDINGS  Left Ventricle: The patient was on high dose  norepinephrine and the heart rate was 135. The LV was contracting vigorously and the ejection fraction was estiamted at 60-65% using the 2D Simpson's method in the 4 and 2 chamber views. The LV wall thickness  was normal. Right Ventricle: The right ventricle has normal systolic function. The cavity was normal. There was an AICD wire seen entering the right atrium from the superior vena cava and crossing the tricuspid valve into the right ventricle. Left Atrium: No left atrial/left atrial appendage thrombus was detected. The LA cavity was enlarged and measured 4.4 cm in the media-lateral dimension. Interatrial Septum: No atrial level shunt detected by color flow Doppler. There is no evidence of a patent foramen ovale. Pericardium: There is a large pleural effusion in the left lateral region. There was no pericardial fluid seen. Mitral Valve: Mitral valve regurgitation is trivial by color flow Doppler. There is No evidence of mitral stenosis. Tricuspid Valve: The tricuspid valve was normal in structure. Tricuspid valve regurgitation is trivial by color flow Doppler. No evidence of tricuspid stenosis is present. Aortic Valve: The aortic valve is tricuspid Aortic valve regurgitation was not visualized by color flow Doppler. There is no stenosis of the aortic valve. The aortic valve leaflets were mildly thickened but opened normally without restriction. Pulmonic Valve: The pulmonic valve was normal in structure, with normal. No evidence of pumonic stenosis. Pulmonic valve regurgitation is not visualized by color flow Doppler. Shunts: There is no evidence of an atrial septal defect.  Roberts Gaudy MD Electronically signed by Roberts Gaudy MD Signature Date/Time: 10/02/2020/8:30:17 PM    Final      Assessment & Plan    Right ventricular perforation with extension into the pleural space; hemodynamic collapse and shock   Nonischemic cardiomyopathy   ICD-acutely paced St Jude   PVCs   To have ICD lead perforation  with a screw in an intercostal artery.  She had a massive hemothorax and is now status post evacuation and ICD removal.  She is doing better, up in a chair.  At this point, we Alton Bouknight continue with current care.  No obvious volume overload.  PVCs burden has significantly reduced.  We Jaquayla Hege continue with current monitoring.    For questions or updates, please contact [facility] Please consult [email or web address] for contact info under Cardiology/STEMI.      Signed, Eugune Sine Meredith Leeds, MD  10/04/2020, 1:10 PM

## 2020-10-04 NOTE — Addendum Note (Signed)
Addendum  created 10/04/20 0646 by Wilburn Cornelia, CRNA   Order list changed

## 2020-10-04 NOTE — Progress Notes (Signed)
Patient ID: Erin Fields, female   DOB: April 20, 1965, 55 y.o.   MRN: [id number] TCTS Evening Rounds:  Stable day. Awaiting bed on 4E.

## 2020-10-05 ENCOUNTER — Inpatient Hospital Stay (HOSPITAL_COMMUNITY): Payer: No Typology Code available for payment source

## 2020-10-05 DIAGNOSIS — S2699XA Other injury of heart, unspecified with or without hemopericardium, initial encounter: Secondary | ICD-10-CM | POA: Diagnosis not present

## 2020-10-05 DIAGNOSIS — I9789 Other postprocedural complications and disorders of the circulatory system, not elsewhere classified: Secondary | ICD-10-CM | POA: Diagnosis not present

## 2020-10-05 LAB — TYPE AND SCREEN
ABO/RH(D): O POS
Antibody Screen: NEGATIVE
Unit division: 0
Unit division: 0
Unit division: 0
Unit division: 0
Unit division: 0
Unit division: 0
Unit division: 0
Unit division: 0
Unit division: 0
Unit division: 0

## 2020-10-05 LAB — BPAM RBC
Blood Product Expiration Date: 202207162359
Blood Product Expiration Date: 202207162359
Blood Product Expiration Date: 202207162359
Blood Product Expiration Date: 202207162359
Blood Product Expiration Date: 202207162359
Blood Product Expiration Date: 202207162359
Blood Product Expiration Date: 202207162359
Blood Product Expiration Date: 202207182359
Blood Product Expiration Date: 202207182359
Blood Product Expiration Date: 202207182359
ISSUE DATE / TIME: 202206181352
ISSUE DATE / TIME: 202206181352
ISSUE DATE / TIME: 202206181433
ISSUE DATE / TIME: 202206181433
ISSUE DATE / TIME: 202206181501
ISSUE DATE / TIME: 202206181501
ISSUE DATE / TIME: 202206181501
ISSUE DATE / TIME: 202206181501
ISSUE DATE / TIME: 202206191210
ISSUE DATE / TIME: 202206191416
Unit Type and Rh: 5100
Unit Type and Rh: 5100
Unit Type and Rh: 5100
Unit Type and Rh: 5100
Unit Type and Rh: 5100
Unit Type and Rh: 5100
Unit Type and Rh: 5100
Unit Type and Rh: 5100
Unit Type and Rh: 5100
Unit Type and Rh: 5100

## 2020-10-05 LAB — CBC
HCT: 31.9 % — ABNORMAL LOW (ref 36.0–46.0)
Hemoglobin: 10.7 g/dL — ABNORMAL LOW (ref 12.0–15.0)
MCH: 30.4 pg (ref 26.0–34.0)
MCHC: 33.5 g/dL (ref 30.0–36.0)
MCV: 90.6 fL (ref 80.0–100.0)
Platelets: 184 10*3/uL (ref 150–400)
RBC: 3.52 MIL/uL — ABNORMAL LOW (ref 3.87–5.11)
RDW: 13 % (ref 11.5–15.5)
WBC: 14 10*3/uL — ABNORMAL HIGH (ref 4.0–10.5)
nRBC: 0 % (ref 0.0–0.2)

## 2020-10-05 LAB — BASIC METABOLIC PANEL
Anion gap: 7 (ref 5–15)
BUN: 5 mg/dL — ABNORMAL LOW (ref 6–20)
CO2: 31 mmol/L (ref 22–32)
Calcium: 8.1 mg/dL — ABNORMAL LOW (ref 8.9–10.3)
Chloride: 96 mmol/L — ABNORMAL LOW (ref 98–111)
Creatinine, Ser: 0.51 mg/dL (ref 0.44–1.00)
GFR, Estimated: >60 mL/min (ref >60)
Glucose, Bld: 149 mg/dL — ABNORMAL HIGH (ref 70–99)
Potassium: 3.3 mmol/L — ABNORMAL LOW (ref 3.5–5.1)
Sodium: 134 mmol/L — ABNORMAL LOW (ref 135–145)

## 2020-10-05 LAB — GLUCOSE, CAPILLARY: Glucose-Capillary: 166 mg/dL — ABNORMAL HIGH (ref 70–99)

## 2020-10-05 LAB — MAGNESIUM: Magnesium: 1.5 mg/dL — ABNORMAL LOW (ref 1.7–2.4)

## 2020-10-05 LAB — SURGICAL PATHOLOGY

## 2020-10-05 MED ORDER — PANTOPRAZOLE SODIUM 40 MG PO TBEC
40.0000 mg | DELAYED_RELEASE_TABLET | Freq: Every day | ORAL | Status: DC
  Administered 2020-10-06 – 2020-10-07 (×2): 40 mg via ORAL
  Filled 2020-10-05 (×2): qty 1

## 2020-10-05 MED FILL — Sodium Chloride IV Soln 0.9%: INTRAVENOUS | Qty: 2000 | Status: AC

## 2020-10-05 NOTE — Progress Notes (Addendum)
CARDIAC REHAB PHASE I   Offered to walk with pt, pt states recent ambulation with mobility. Practiced getting in and out of bed with son. Pt helped to BR, than returned to recliner. Pt switched to tele box. Demonstrating 750 on IS. Encouraged continued ambulation with family and out of bed as much as able. Questions and concerns addressed. Will continue to follow.  [id number] Rufina Falco, RN BSN 10/05/2020 2:14 PM

## 2020-10-05 NOTE — Progress Notes (Signed)
PHARMACIST - PHYSICIAN COMMUNICATION  DR:   Roxy Manns  CONCERNING: IV to Oral Route Change Policy  RECOMMENDATION: This patient is receiving Protonix by the intravenous route.  Based on criteria approved by the Pharmacy and Therapeutics Committee, the intravenous medication(s) is/are being converted to the equivalent oral dose form(s).   DESCRIPTION: These criteria include: The patient is eating (either orally or via tube) and/or has been taking other orally administered medications for a least 24 hours The patient has no evidence of active gastrointestinal bleeding or impaired GI absorption (gastrectomy, short bowel, patient on TNA or NPO).  If you have questions about this conversion, please contact the Pharmacy Department  []   [phone] )  [facility] []   [phone] )  [facility] [x]   [phone] )  [facility] []   [phone] )  [facility] []   [phone] )  [facility]

## 2020-10-05 NOTE — Progress Notes (Signed)
Mobility Specialist - Progress Note   10/05/20 1200  Mobility  Activity Ambulated in hall  Level of Assistance Minimal assist, patient does 75% or more  Assistive Device Front wheel walker  Mobility Ambulated with assistance in hallway  Mobility Response Tolerated well  Mobility performed by Mobility specialist   Pre-mobility: 86 HR, 98% SpO2 During mobility: 109 HR, 97%  SpO2 Post-mobility: 91 HR, 97% SpO2  Pt min assist for bed mobility and to stand from edge of bed. 2/4 DOE, otherwise asx. Pt left in bed after walk, call bell at side, and family member in room.   Pricilla Handler Mobility Specialist Mobility Specialist Phone: [phone]

## 2020-10-05 NOTE — Progress Notes (Addendum)
[phone]            [place] [address]          [phone]   3 Days Post-Op Procedure(s) (LRB): MEDIAN STERNOTOMY REPAIR PERFORATED VENTRICLE EVACUATION LEFT HEMOTHORAX (N/A) TRANSESOPHAGEAL ECHOCARDIOGRAM (TEE) (N/A) ICD LEAD REMOVAL (N/A) GENERATOR REMOVAL  Total Length of Stay:  LOS: 3 days   Subjective: Transferred from ICU to 4E last evening.  Awake and alert, feels she is progressing. Up walking around in her room. Tolerating PO's. BM x 1 yesterday.      Objective: Vital signs in last 24 hours: Temp:  [97.7 F (36.5 C)-98.3 F (36.8 C)] 98.3 F (36.8 C) (06/21 0338) Pulse Rate:  [72-111] 86 (06/21 0740) Cardiac Rhythm: Normal sinus rhythm (06/20 2351) Resp:  [15-27] 20 (06/21 0740) BP: (98-143)/(57-86) 113/57 (06/21 0740) SpO2:  [93 %-100 %] 96 % (06/21 0749) Weight:  [77.9 kg] 77.9 kg (06/21 0338)  Filed Weights   10/03/20 0500 10/04/20 0500 10/05/20 0338  Weight: 79 kg 77.2 kg 77.9 kg    Weight change: 0.7 kg     Intake/Output from previous day: 06/20 0701 - 06/21 0700 In: 3 [I.V.:3] Out: 3300 [Urine:3250; Chest Tube:50]  Intake/Output this shift: No intake/output data recorded.  Current Meds: Scheduled Meds:  acetaminophen  1,000 mg Oral Q6H   aspirin EC  81 mg Oral Daily   bisacodyl  10 mg Oral Daily   Or   bisacodyl  10 mg Rectal Daily   carvedilol  12.5 mg Oral BID   Chlorhexidine Gluconate Cloth  6 each Topical Daily   docusate sodium  100 mg Oral BID   enoxaparin (LOVENOX) injection  40 mg Subcutaneous QHS   furosemide  40 mg Oral Daily   hyoscyamine  0.375 mg Oral BID   levothyroxine  75 mcg Oral QAC breakfast   lidocaine  1 patch Transdermal Q24H   mometasone-formoterol  2 puff Inhalation BID   montelukast  10 mg Oral QHS   pantoprazole (PROTONIX) IV  40 mg Intravenous Daily   polyethylene glycol  17 g Per Tube Daily   potassium chloride  20 mEq Oral BID   rosuvastatin  20 mg Oral QHS    sacubitril-valsartan  1 tablet Oral BID   sodium chloride flush  10-40 mL Intracatheter Q12H   sodium chloride flush  3 mL Intravenous Q12H   spironolactone  12.5 mg Oral Daily   venlafaxine XR  150 mg Oral Q breakfast   Continuous Infusions:  sodium chloride     lactated ringers     PRN Meds:.cyclobenzaprine, dextrose, fentaNYL (SUBLIMAZE) injection, metoprolol tartrate, ondansetron (ZOFRAN) IV, sodium chloride flush, sodium chloride flush  General appearance: alert, cooperative, and no distress Neurologic: intact Heart: SR with few PVC's Lungs: breath sounds are clear.  Wound: the sternotomy incision is covered with a dry dressing  Lab Results: CBC: Recent Labs    10/04/20 0332 10/05/20 0052  WBC 14.9* 14.0*  HGB 12.0 10.7*  HCT 35.5* 31.9*  PLT 141* 184    BMET:  Recent Labs    10/04/20 0332 10/05/20 0052  NA 133* 134*  K 4.0 3.3*  CL 97* 96*  CO2 28 31  GLUCOSE 120* 149*  BUN <5* 5*  CREATININE 0.47 0.51  CALCIUM 8.5* 8.1*     CMET: Lab Results  Component Value Date   WBC 14.0 (  H) 10/05/2020   HGB 10.7 (L) 10/05/2020   HCT 31.9 (L) 10/05/2020   PLT 184 10/05/2020   GLUCOSE 149 (H) 10/05/2020   CHOL 128 08/10/2020   TRIG 153 (H) 08/10/2020   HDL 37 (L) 08/10/2020   LDLDIRECT 101.0 12/02/2018   LDLCALC 65 08/10/2020   ALT 14 10/02/2020   AST 15 10/02/2020   NA 134 (L) 10/05/2020   K 3.3 (L) 10/05/2020   CL 96 (L) 10/05/2020   CREATININE 0.51 10/05/2020   BUN 5 (L) 10/05/2020   CO2 31 10/05/2020   TSH 0.31 (L) 09/27/2020   INR 1.4 (H) 10/02/2020   HGBA1C 6.3 09/27/2020      PT/INR:  Recent Labs    10/02/20 1640  LABPROT 17.3*  INR 1.4*    Radiology: No results found.   Assessment/Plan: S/P Procedure(s) (LRB): MEDIAN STERNOTOMY REPAIR PERFORATED VENTRICLE EVACUATION LEFT HEMOTHORAX (N/A) TRANSESOPHAGEAL ECHOCARDIOGRAM (TEE) (N/A) ICD LEAD REMOVAL (N/A) GENERATOR REMOVAL  -POD4 emergency sternotomy for evacuation of left  hemothorax, repair of RV perforation and repair of laceration of left 5th intercostal artery.  Slow progress with ambulation. Will ask PT to eval.  -Pulm- Improving as she is diuresed, now on RA  -H/O non-ischemic cardiomyopathy, EF 25%- medical mgt per cardiology.   -Volume excess- good response to Lasix yesterday. Wt 1kg below pre-op. Lasix decreased to once daily dosing.   -Expected acute blood loss anemia- mild. Hct is stable.   -Disposition- planning for eventual discharge to home in ~2 days if rhythm stable and cardiology agrees. Antony Odea, PA-C [phone] 10/05/2020 8:05 AM   Chart reviewed, patient examined, agree with above.

## 2020-10-05 NOTE — Progress Notes (Signed)
CARDIAC REHAB PHASE I   Went to offer to walk with pt. Pt recently up ambulating with NT. Will f/u later to encourage continued ambulation.  Rufina Falco, RN BSN 10/05/2020 10:03 AM

## 2020-10-05 NOTE — Hospital Course (Signed)
PCP is Copland, Gay Filler, MD Referring Provider is Virl Axe, MD Primary Cardiologist is Jenne Campus, MD  History of Present Illness:  Patient is a 55 year old morbidly obese female with history of dilated nonischemic cardiomyopathy who has been referred for emergent surgical consultation due to development of left hemothorax, likely perforation of the right ventricle, and hypovolemic shock.   Patient underwent placement of single lead ICD October 01, 2020 by Dr. Curt Bears and presented to [facility] earlier today with severe chest pain and shortness of breath.  Chest x-ray revealed left hemothorax and blood work revealed anemia.  The patient was notably tachycardic and hypotensive, consistent with hypovolemic shock.  Noncontrast CT scan of the chest revealed perforation of the right ventricle with the tip of the patient's ventricular lead extending outside of the heart into the left pleural space.  The patient was promptly transferred to [facility] where she was immediately evaluated upon her arrival in the emergency department.  Patient complains of severe resting shortness of breath and pain across her left chest.  She is gasping for air.  Symptoms of chest pain reportedly began the evening before and became acutely worse earlier this morning.  Remainder of her review of systems is noncontributory.   Course in Hospital:  Ms. Perillo was taken to the operating room emergently.  The existing ICD and lead were removed.  A median sternotomy was performed and the large left hemothorax was evacuated.  The perforation in the right ventricle was repaired.  Additionally, there was a laceration of the left fifth intercostal artery which was also repaired.  Following the procedure, she was transferred to the surgical ICU.  She remained hemodynamically stable.  She was weaned from the ventilator and extubated in the evening on the day of surgery.  She was started back on  her carvedilol on postop day 1.  She was diuresed over the next few days with IV and oral Lasix.  The chest tube drainage slowed and subsided allowing the chest tubes to be removed on postop day 3.  She was followed by cardiology service and heart failure medications were managed by their team.  She was transferred to progressive care on postop day 3.  She was mobilized and made satisfactory progress with ambulation.  Diet was advanced and well-tolerated.  She had return of appropriate bowel bladder function.

## 2020-10-05 NOTE — Progress Notes (Signed)
Progress Note  Patient Name: Erin Fields Date of Encounter: 10/05/2020  Primary Cardiologist: Jenne Campus, MD     Patient Profile     55 y.o. female admitted 6/18 for pain hemothorax and shock 2 d following ICD implant with evidence of extracardiac perforation of ICD lead.  At repair-- sternotomy and puncture  lead was in L chest  Subjective   Continuing to feel improved. Pain at incision site. Remains in sinus rhythm  Inpatient Medications    Scheduled Meds:  acetaminophen  1,000 mg Oral Q6H   aspirin EC  81 mg Oral Daily   bisacodyl  10 mg Oral Daily   Or   bisacodyl  10 mg Rectal Daily   carvedilol  12.5 mg Oral BID   Chlorhexidine Gluconate Cloth  6 each Topical Daily   docusate sodium  100 mg Oral BID   enoxaparin (LOVENOX) injection  40 mg Subcutaneous QHS   furosemide  40 mg Oral Daily   hyoscyamine  0.375 mg Oral BID   levothyroxine  75 mcg Oral QAC breakfast   lidocaine  1 patch Transdermal Q24H   mometasone-formoterol  2 puff Inhalation BID   montelukast  10 mg Oral QHS   pantoprazole (PROTONIX) IV  40 mg Intravenous Daily   polyethylene glycol  17 g Per Tube Daily   potassium chloride  20 mEq Oral BID   rosuvastatin  20 mg Oral QHS   sacubitril-valsartan  1 tablet Oral BID   sodium chloride flush  10-40 mL Intracatheter Q12H   sodium chloride flush  3 mL Intravenous Q12H   spironolactone  12.5 mg Oral Daily   venlafaxine XR  150 mg Oral Q breakfast   Continuous Infusions:  sodium chloride     lactated ringers     PRN Meds: cyclobenzaprine, dextrose, fentaNYL (SUBLIMAZE) injection, metoprolol tartrate, ondansetron (ZOFRAN) IV, sodium chloride flush, sodium chloride flush   Vital Signs    Vitals:   10/04/20 2300 10/04/20 2346 10/05/20 0338 10/05/20 0740  BP: 107/64 98/67 (!) 100/59 (!) 113/57  Pulse: 99 99 72 86  Resp: 16  15 20   Temp:  98.3 F (36.8 C) 98.3 F (36.8 C)   TempSrc:  Oral Oral Oral  SpO2: 100%   97%  Weight:   77.9  kg     Intake/Output Summary (Last 24 hours) at 10/05/2020 0748 Last data filed at 10/04/2020 2300 Gross per 24 hour  Intake 3 ml  Output 3300 ml  Net -3297 ml    Filed Weights   10/03/20 0500 10/04/20 0500 10/05/20 0338  Weight: 79 kg 77.2 kg 77.9 kg    Telemetry    Sinus rhythm - personally reviewed  ECG    None new  Physical Exam    GEN: Well nourished, well developed, in no acute distress  HEENT: normal  Neck: no JVD, carotid bruits, or masses Cardiac: RRR; no murmurs, rubs, or gallops,no edema  Respiratory:  clear to auscultation bilaterally, normal work of breathing GI: soft, nontender, nondistended, + BS MS: no deformity or atrophy  Skin: warm and dry, bandages clean and dry Neuro:  Strength and sensation are intact Psych: euthymic mood, full affect    Labs    Chemistry Recent Labs  Lab 10/01/20 2038 10/02/20 1139 10/02/20 1149 10/03/20 1613 10/04/20 0332 10/05/20 0052  NA 135 130*   < > 133* 133* 134*  K 3.7 4.0   < > 3.8 4.0 3.3*  CL 103 95*   < >  101 97* 96*  CO2 23 23   < > 27 28 31   GLUCOSE 131* 328*   < > 129* 120* 149*  BUN 7 5*   < > <5* <5* 5*  CREATININE 0.56 0.81   < > 0.48 0.47 0.51  CALCIUM 9.2 9.4   < > 7.9* 8.5* 8.1*  PROT 6.0* 6.5  --   --   --   --   ALBUMIN 3.8 4.0  --   --   --   --   AST 16 15  --   --   --   --   ALT 16 14  --   --   --   --   ALKPHOS 60 58  --   --   --   --   BILITOT 0.8 0.9  --   --   --   --   GFRNONAA >60 >60   < > >60 >60 >60  ANIONGAP 9 12   < > 5 8 7    < > = values in this interval not displayed.      Hematology Recent Labs  Lab 10/03/20 1613 10/04/20 0332 10/05/20 0052  WBC 16.9* 14.9* 14.0*  RBC 4.56 3.94 3.52*  HGB 14.0 12.0 10.7*  HCT 41.1 35.5* 31.9*  MCV 90.1 90.1 90.6  MCH 30.7 30.5 30.4  MCHC 34.1 33.8 33.5  RDW 13.4 13.3 13.0  PLT 135* 141* 184     Cardiac EnzymesNo results for input(s): TROPONINI in the last 168 hours. No results for input(s): TROPIPOC in the last 168  hours.   BNPNo results for input(s): BNP, PROBNP in the last 168 hours.   DDimer No results for input(s): DDIMER in the last 168 hours.   Radiology    DG Chest Port 1 View  Result Date: 10/04/2020 CLINICAL DATA:  Chest tubes, post open heart surgery, sore chest EXAM: PORTABLE CHEST 1 VIEW COMPARISON:  Portable exam 0514 hours compared to 10/03/2020 FINDINGS: Mediastinal drains and LEFT thoracostomy tube present. RIGHT jugular central venous catheter with tip projecting over SVC. Enlargement of cardiac silhouette. Mediastinal contours and pulmonary vascularity normal. Decreased lung volumes with bibasilar atelectasis. Small LEFT apex pneumothorax. Osseous structures unremarkable. IMPRESSION: Decreased lung volumes with bibasilar atelectasis. Small LEFT apex pneumothorax despite thoracostomy tube. Electronically Signed   By: Lavonia Dana M.D.   On: 10/04/2020 08:42     Assessment & Plan    Right ventricular perforation with extension into the pleural space; hemodynamic collapse and shock   Nonischemic cardiomyopathy   ICD-acutely paced St Jude   PVCs   PVC burden appears to have reduced. HR has also reduced. No signs of volume overload. Would continue with supportive measures. Lekisha Mcghee need case management and PT prior to discharge.    For questions or updates, please contact [place] Please consult [email or web address] for contact info under Cardiology/STEMI.      Signed, Peyten Punches Meredith Leeds, MD  10/05/2020, 7:48 AM

## 2020-10-06 DIAGNOSIS — I9789 Other postprocedural complications and disorders of the circulatory system, not elsewhere classified: Secondary | ICD-10-CM | POA: Diagnosis not present

## 2020-10-06 DIAGNOSIS — S2699XA Other injury of heart, unspecified with or without hemopericardium, initial encounter: Secondary | ICD-10-CM | POA: Diagnosis not present

## 2020-10-06 LAB — BASIC METABOLIC PANEL
Anion gap: 7 (ref 5–15)
BUN: 5 mg/dL — ABNORMAL LOW (ref 6–20)
CO2: 30 mmol/L (ref 22–32)
Calcium: 8.3 mg/dL — ABNORMAL LOW (ref 8.9–10.3)
Chloride: 102 mmol/L (ref 98–111)
Creatinine, Ser: 0.47 mg/dL (ref 0.44–1.00)
GFR, Estimated: >60 mL/min (ref >60)
Glucose, Bld: 146 mg/dL — ABNORMAL HIGH (ref 70–99)
Potassium: 3.7 mmol/L (ref 3.5–5.1)
Sodium: 139 mmol/L (ref 135–145)

## 2020-10-06 MED ORDER — FENTANYL CITRATE (PF) 100 MCG/2ML IJ SOLN: 50.0000 ug | INTRAMUSCULAR | Status: DC | PRN

## 2020-10-06 MED ORDER — TRAMADOL HCL 50 MG PO TABS
100.0000 mg | ORAL_TABLET | Freq: Four times a day (QID) | ORAL | Status: DC | PRN
Start: 2020-10-06 — End: 2020-10-07
  Administered 2020-10-06 – 2020-10-07 (×3): 100 mg via ORAL
  Filled 2020-10-06 (×3): qty 2

## 2020-10-06 NOTE — Progress Notes (Signed)
Progress Note  Patient Name: Erin Fields Date of Encounter: 10/06/2020  Primary Cardiologist: Jenne Campus, MD     Patient Profile     55 y.o. female admitted 6/18 for pain hemothorax and shock 2 d following ICD implant with evidence of extracardiac perforation of ICD lead.  At repair-- sternotomy and puncture  lead was in L chest  Subjective   Patient continuing to improve. Soreness at incision sites. Up with rehab yesterday.  Inpatient Medications    Scheduled Meds:  acetaminophen  1,000 mg Oral Q6H   aspirin EC  81 mg Oral Daily   bisacodyl  10 mg Oral Daily   Or   bisacodyl  10 mg Rectal Daily   carvedilol  12.5 mg Oral BID   Chlorhexidine Gluconate Cloth  6 each Topical Daily   docusate sodium  100 mg Oral BID   enoxaparin (LOVENOX) injection  40 mg Subcutaneous QHS   furosemide  40 mg Oral Daily   hyoscyamine  0.375 mg Oral BID   levothyroxine  75 mcg Oral QAC breakfast   lidocaine  1 patch Transdermal Q24H   mometasone-formoterol  2 puff Inhalation BID   montelukast  10 mg Oral QHS   pantoprazole  40 mg Oral Daily   polyethylene glycol  17 g Per Tube Daily   potassium chloride  20 mEq Oral BID   rosuvastatin  20 mg Oral QHS   sacubitril-valsartan  1 tablet Oral BID   sodium chloride flush  10-40 mL Intracatheter Q12H   sodium chloride flush  3 mL Intravenous Q12H   spironolactone  12.5 mg Oral Daily   venlafaxine XR  150 mg Oral Q breakfast   Continuous Infusions:  sodium chloride     lactated ringers     PRN Meds: cyclobenzaprine, dextrose, fentaNYL (SUBLIMAZE) injection, metoprolol tartrate, ondansetron (ZOFRAN) IV, sodium chloride flush, sodium chloride flush   Vital Signs    Vitals:   10/05/20 2057 10/05/20 2316 10/06/20 0347 10/06/20 0733  BP: 123/60 (!) 111/53 132/65 (!) 115/57  Pulse: 91 71 80 91  Resp: 20 19 20 19   Temp:  98.3 F (36.8 C) 98.3 F (36.8 C) 98.7 F (37.1 C)  TempSrc:  Oral Oral Oral  SpO2: 100% 100% 100% 100%   Weight:        Intake/Output Summary (Last 24 hours) at 10/06/2020 0854 Last data filed at 10/06/2020 0736 Gross per 24 hour  Intake 240 ml  Output --  Net 240 ml    Filed Weights   10/03/20 0500 10/04/20 0500 10/05/20 0338  Weight: 79 kg 77.2 kg 77.9 kg    Telemetry    Sinus rhythm - personally reviewed  ECG    None new  Physical Exam    GEN: Well nourished, well developed, in no acute distress  HEENT: normal  Neck: no JVD, carotid bruits, or masses Cardiac: RRR; no murmurs, rubs, or gallops,no edema  Respiratory:  clear to auscultation bilaterally, normal work of breathing GI: soft, nontender, nondistended, + BS MS: no deformity or atrophy  Skin: warm and dry, incision sites intact Neuro:  Strength and sensation are intact Psych: euthymic mood, full affect  Labs    Chemistry Recent Labs  Lab 10/01/20 2038 10/02/20 1139 10/02/20 1149 10/04/20 0332 10/05/20 0052 10/06/20 0614  NA 135 130*   < > 133* 134* 139  K 3.7 4.0   < > 4.0 3.3* 3.7  CL 103 95*   < > 97* 96* 102  CO2 23 23   < > 28 31 30   GLUCOSE 131* 328*   < > 120* 149* 146*  BUN 7 5*   < > <5* 5* 5*  CREATININE 0.56 0.81   < > 0.47 0.51 0.47  CALCIUM 9.2 9.4   < > 8.5* 8.1* 8.3*  PROT 6.0* 6.5  --   --   --   --   ALBUMIN 3.8 4.0  --   --   --   --   AST 16 15  --   --   --   --   ALT 16 14  --   --   --   --   ALKPHOS 60 58  --   --   --   --   BILITOT 0.8 0.9  --   --   --   --   GFRNONAA >60 >60   < > >60 >60 >60  ANIONGAP 9 12   < > 8 7 7    < > = values in this interval not displayed.      Hematology Recent Labs  Lab 10/03/20 1613 10/04/20 0332 10/05/20 0052  WBC 16.9* 14.9* 14.0*  RBC 4.56 3.94 3.52*  HGB 14.0 12.0 10.7*  HCT 41.1 35.5* 31.9*  MCV 90.1 90.1 90.6  MCH 30.7 30.5 30.4  MCHC 34.1 33.8 33.5  RDW 13.4 13.3 13.0  PLT 135* 141* 184     Cardiac EnzymesNo results for input(s): TROPONINI in the last 168 hours. No results for input(s): TROPIPOC in the last 168  hours.   BNPNo results for input(s): BNP, PROBNP in the last 168 hours.   DDimer No results for input(s): DDIMER in the last 168 hours.   Radiology    DG Chest 2 View  Result Date: 10/05/2020 CLINICAL DATA:  Atelectasis. EXAM: CHEST - 2 VIEW COMPARISON:  10/04/2020. FINDINGS: Interim removal of right IJ line, mediastinal drainage catheter, and left chest tube. Miniscule left apical pneumothorax again noted. Mediastinum and hilar structures normal. Heart size normal. Low lung volumes with bibasilar atelectasis, improved from prior exam. Surgical clips right chest. IMPRESSION: 1. Interval removal of lines and tubes including left chest tube. Miniscule left apical pneumothorax again noted. 2. Low lung volumes with bibasilar atelectasis, improved from prior exam. Electronically Signed   By: Marcello Moores  Register   On: 10/05/2020 09:27     Assessment & Plan    Right ventricular perforation with extension into the pleural space; hemodynamic collapse and shock   Nonischemic cardiomyopathy   ICD-acutely paced St Jude   PVCs   Rare PVCs. No further workup needed. Working with rehab. Management per surgery.    For questions or updates, please contact [place] Please consult [email or web address] for contact info under Cardiology/STEMI.      Signed, Emmamarie Kluender Meredith Leeds, MD  10/06/2020, 8:54 AM

## 2020-10-06 NOTE — Progress Notes (Signed)
[address]            [place] [address]          [phone]   4 Days Post-Op Procedure(s) (LRB): MEDIAN STERNOTOMY REPAIR PERFORATED VENTRICLE EVACUATION LEFT HEMOTHORAX (N/A) TRANSESOPHAGEAL ECHOCARDIOGRAM (TEE) (N/A) ICD LEAD REMOVAL (N/A) GENERATOR REMOVAL  Total Length of Stay:  LOS: 4 days   C/O discomfort over the ICD pocket left chest, otherwise feels OK. Making some progress with mobility. Off of nasal cannula O2 most of yesterday but it was resumed over night.    Objective: Vital signs in last 24 hours: Temp:  [97.8 F (36.6 C)-98.7 F (37.1 C)] 98.7 F (37.1 C) (06/22 0733) Pulse Rate:  [71-91] 91 (06/22 0733) Cardiac Rhythm: Normal sinus rhythm (06/22 0756) Resp:  [18-20] 19 (06/22 0733) BP: (92-132)/(53-65) 115/57 (06/22 0733) SpO2:  [97 %-100 %] 100 % (06/22 0733)  Filed Weights   10/03/20 0500 10/04/20 0500 10/05/20 0338  Weight: 79 kg 77.2 kg 77.9 kg    Weight change:      Intake/Output from previous day: No intake/output data recorded.  Intake/Output this shift: Total I/O In: 240 [P.O.:240] Out: -   Current Meds: Scheduled Meds:  acetaminophen  1,000 mg Oral Q6H   aspirin EC  81 mg Oral Daily   bisacodyl  10 mg Oral Daily   Or   bisacodyl  10 mg Rectal Daily   carvedilol  12.5 mg Oral BID   Chlorhexidine Gluconate Cloth  6 each Topical Daily   docusate sodium  100 mg Oral BID   enoxaparin (LOVENOX) injection  40 mg Subcutaneous QHS   furosemide  40 mg Oral Daily   hyoscyamine  0.375 mg Oral BID   levothyroxine  75 mcg Oral QAC breakfast   lidocaine  1 patch Transdermal Q24H   mometasone-formoterol  2 puff Inhalation BID   montelukast  10 mg Oral QHS   pantoprazole  40 mg Oral Daily   polyethylene glycol  17 g Per Tube Daily   potassium chloride  20 mEq Oral BID   rosuvastatin  20 mg Oral QHS   sacubitril-valsartan  1 tablet Oral BID   sodium chloride flush  10-40 mL Intracatheter Q12H    sodium chloride flush  3 mL Intravenous Q12H   spironolactone  12.5 mg Oral Daily   venlafaxine XR  150 mg Oral Q breakfast   Continuous Infusions:  sodium chloride     lactated ringers     PRN Meds:.cyclobenzaprine, dextrose, fentaNYL (SUBLIMAZE) injection, metoprolol tartrate, ondansetron (ZOFRAN) IV, sodium chloride flush, sodium chloride flush  General appearance: alert, cooperative, and mild distress Neurologic: intact Heart: NSR, PVC's mostly resolved.  Lungs: breath sounds are clear.  Wound: the sternotomy incision and left chest ICD incision are both open to air, are dry and intact. There is some induration and tenderness at the ICD site but it is not inflamed or draining.   Lab Results: CBC: Recent Labs    10/04/20 0332 10/05/20 0052  WBC 14.9* 14.0*  HGB 12.0 10.7*  HCT 35.5* 31.9*  PLT 141* 184    BMET:  Recent Labs    10/05/20 0052 10/06/20 0614  NA 134* 139  K 3.3* 3.7  CL 96* 102  CO2 31 30  GLUCOSE 149* 146*  BUN 5* 5*  CREATININE 0.51 0.47  CALCIUM 8.1* 8.3*  CMET: Lab Results  Component Value Date   WBC 14.0 (H) 10/05/2020   HGB 10.7 (L) 10/05/2020   HCT 31.9 (L) 10/05/2020   PLT 184 10/05/2020   GLUCOSE 146 (H) 10/06/2020   CHOL 128 08/10/2020   TRIG 153 (H) 08/10/2020   HDL 37 (L) 08/10/2020   LDLDIRECT 101.0 12/02/2018   LDLCALC 65 08/10/2020   ALT 14 10/02/2020   AST 15 10/02/2020   NA 139 10/06/2020   K 3.7 10/06/2020   CL 102 10/06/2020   CREATININE 0.47 10/06/2020   BUN 5 (L) 10/06/2020   CO2 30 10/06/2020   TSH 0.31 (L) 09/27/2020   INR 1.4 (H) 10/02/2020   HGBA1C 6.3 09/27/2020      PT/INR:  No results for input(s): LABPROT, INR in the last 72 hours.  Radiology: No results found.   Assessment/Plan: S/P Procedure(s) (LRB): MEDIAN STERNOTOMY REPAIR PERFORATED VENTRICLE EVACUATION LEFT HEMOTHORAX (N/A) TRANSESOPHAGEAL ECHOCARDIOGRAM (TEE) (N/A) ICD LEAD REMOVAL (N/A) GENERATOR REMOVAL  -POD5 emergency  sternotomy for evacuation of left hemothorax, repair of RV perforation and repair of laceration of left 5th intercostal artery.  BP and cardiac rhythm are stable. Progressing with mobility.   -Pulm- Improving as she is diuresed, On RA yesterday but nasal cannula replaced over night.   -H/O non-ischemic cardiomyopathy, EF 25%- medical mgt per cardiology. PVC's have resolved.  -Volume excess- good response to Lasix past few days. Wt 1kg below pre-op. No new Wt today. Continue po Lasix daily.  -Expected acute blood loss anemia- mild. Hct is stable.   -Disposition- planning for eventual discharge to home in ~1-2 days if rhythm stable and cardiology agrees. Antony Odea, PA-C [phone] 10/06/2020 8:18 AM

## 2020-10-06 NOTE — Progress Notes (Signed)
Mobility Specialist: Progress Note   10/06/20 1630  Mobility  Activity Ambulated in hall  Level of Assistance Standby assist, set-up cues, supervision of patient - no hands on  Assistive Device Front wheel walker  Distance Ambulated (ft) 370 ft  Mobility Ambulated independently in hallway  Mobility Response Tolerated well  Mobility performed by Mobility specialist  $Mobility charge 1 Mobility   Pre-Mobility: 86 HR, 96% SpO2 Post-Mobility: 106 HR, 139/73 BP, 100% SpO2  Pt c/o R hip pain during ambulation, otherwise asx. Pt to BR and then back to bed after walk per request with call bell at her side.   [facility] Neshia Mckenzie Mobility Specialist Mobility Specialist Phone: [phone]

## 2020-10-06 NOTE — Progress Notes (Signed)
CARDIAC REHAB PHASE I   PRE:  Rate/Rhythm: 95 NSR  BP:  Sitting: 103/59      SaO2: 97  MODE:  Ambulation: 350 ft   POST:  Rate/Rhythm: 98 NSR  BP:  Sitting: 117/66      SaO2: 100  Pt assist getting out of bed. Pt walked 350 ft. Pt ambulated w/ RW and assit. Pt gait slightly unsteady w/ walker. Encouraged ambulation and IS use. Will continue to follow  [place], [place] 10/06/2020 9:49 AM

## 2020-10-06 NOTE — Evaluation (Signed)
Physical Therapy Evaluation Patient Details Name: Erin Fields MRN: [id number] DOB: October 29, 1965 Today's Date: 10/06/2020   History of Present Illness  pt is a 55 y/o female presented to ED 6/17 with severe chest pain and SOB.  Chest xray revealed L hemothorax.  CT scan revealed perforation of the right ventricle withthe tip of the patient's ventricular lead extending outside of the heart into the pleural space.  6/18  s/p median sternotomy, removal of the ICD, repair of the perforated right ventricle and lac to the left 5th intercostal artery plus evac of the left hemothorax.  Sedated on vent post op, extubated 6/18.  PMHx:  includes, DM, Cervical CA, CHF, dilatated CM, Crohn's ds, Breast CA, UC  Clinical Impression  Pt admitted with/for event as described above, s/p surgical intervention via sternotomy as described above.  Pt needing minimal assist to transition OOB, but otherwise needs min guard to supervision for mobility.  Pt currently limited functionally due to the problems listed below.  (see problems list.)  Pt will benefit from PT to maximize function and safety to be able to get home safely with available assist.     Follow Up Recommendations No PT follow up;Supervision - Intermittent    Equipment Recommendations  Rolling walker with 5" wheels;3in1 (PT)    Recommendations for Other Services       Precautions / Restrictions Precautions Precautions: Fall      Mobility  Bed Mobility Overal bed mobility: Needs Assistance Bed Mobility: Rolling;Sidelying to Sit;Sit to Sidelying Rolling: Min assist;Min guard Sidelying to sit: Min assist     Sit to sidelying: Min assist;Min guard General bed mobility comments: cued/demo'd and practiced technique and trying to build more momentum.  Much improved after 7 trials, but still having trouble side to sit.    Transfers Overall transfer level: Needs assistance   Transfers: Sit to/from Stand Sit to Stand: Supervision          General transfer comment: cues for sternal precautions, pt able to stand without use of UE's from lower surface.  Ambulation/Gait Ambulation/Gait assistance: Supervision Gait Distance (Feet): 300 Feet Assistive device: Rolling walker (2 wheeled) Gait Pattern/deviations: Step-through pattern   Gait velocity interpretation: 1.31 - 2.62 ft/sec, indicative of limited community ambulator General Gait Details: steady with R LE antalgic gait pattern due to bad right hip.  Stairs            Wheelchair Mobility    Modified Rankin (Stroke Patients Only)       Balance Overall balance assessment: No apparent balance deficits (not formally assessed)                                           Pertinent Vitals/Pain Pain Assessment: Faces Faces Pain Scale: Hurts little more Pain Location: sternum Pain Descriptors / Indicators: Aching;Discomfort;Grimacing;Guarding Pain Intervention(s): Monitored during session    Home Living Family/patient expects to be discharged to:: Private residence Living Arrangements: Children (adult son who is Ship broker and works outside the home) Available Help at Discharge: Family;Available PRN/intermittently Type of Home: House Home Access: Stairs to enter Entrance Stairs-Rails: None Entrance Stairs-Number of Steps: 1 Home Layout: One level Home Equipment: None      Prior Function Level of Independence: Independent               Hand Dominance        Extremity/Trunk Assessment  Upper Extremity Assessment Upper Extremity Assessment: Overall WFL for tasks assessed    Lower Extremity Assessment Lower Extremity Assessment: Overall WFL for tasks assessed    Cervical / Trunk Assessment Cervical / Trunk Assessment: Normal  Communication   Communication: No difficulties  Cognition Arousal/Alertness: Awake/alert Behavior During Therapy: WFL for tasks assessed/performed Overall Cognitive Status: Within Functional Limits  for tasks assessed                                        General Comments General comments (skin integrity, edema, etc.): vss on RA overall    Exercises     Assessment/Plan    PT Assessment Patient needs continued PT services  PT Problem List Decreased mobility;Decreased knowledge of precautions;Pain;Decreased activity tolerance       PT Treatment Interventions DME instruction;Gait training;Stair training;Functional mobility training;Therapeutic activities;Patient/family education    PT Goals (Current goals can be found in the Care Plan section)  Acute Rehab PT Goals Patient Stated Goal: home independent PT Goal Formulation: With patient Time For Goal Achievement: 10/20/20 Potential to Achieve Goals: Good    Frequency Min 3X/week   Barriers to discharge        Co-evaluation               AM-PAC PT "6 Clicks" Mobility  Outcome Measure Help needed turning from your back to your side while in a flat bed without using bedrails?: A Little Help needed moving from lying on your back to sitting on the side of a flat bed without using bedrails?: A Little Help needed moving to and from a bed to a chair (including a wheelchair)?: A Little Help needed standing up from a chair using your arms (e.g., wheelchair or bedside chair)?: A Little Help needed to walk in hospital room?: A Little Help needed climbing 3-5 steps with a railing? : A Little 6 Click Score: 18    End of Session   Activity Tolerance: Patient tolerated treatment well Patient left: in bed;with call bell/phone within reach Nurse Communication: Mobility status PT Visit Diagnosis: Other abnormalities of gait and mobility (R26.89);Pain Pain - part of body:  (sternum)    Time: 2836-6294 PT Time Calculation (min) (ACUTE ONLY): 29 min   Charges:   PT Evaluation $PT Eval Moderate Complexity: 1 Mod PT Treatments $Gait Training: 8-22 mins        10/06/2020  Erin Fields., PT Acute  Rehabilitation Services [phone]  (pager) [phone]  (office)  Erin Fields 10/06/2020, 2:08 PM

## 2020-10-07 ENCOUNTER — Other Ambulatory Visit (HOSPITAL_COMMUNITY): Payer: Self-pay

## 2020-10-07 ENCOUNTER — Telehealth (HOSPITAL_COMMUNITY): Payer: Self-pay

## 2020-10-07 ENCOUNTER — Encounter: Payer: Self-pay | Admitting: Family

## 2020-10-07 LAB — BASIC METABOLIC PANEL
Anion gap: 7 (ref 5–15)
BUN: 6 mg/dL (ref 6–20)
CO2: 29 mmol/L (ref 22–32)
Calcium: 8.4 mg/dL — ABNORMAL LOW (ref 8.9–10.3)
Chloride: 101 mmol/L (ref 98–111)
Creatinine, Ser: 0.58 mg/dL (ref 0.44–1.00)
GFR, Estimated: >60 mL/min (ref >60)
Glucose, Bld: 126 mg/dL — ABNORMAL HIGH (ref 70–99)
Potassium: 3.7 mmol/L (ref 3.5–5.1)
Sodium: 137 mmol/L (ref 135–145)

## 2020-10-07 MED ORDER — TRAMADOL HCL 50 MG PO TABS
50.0000 mg | ORAL_TABLET | Freq: Four times a day (QID) | ORAL | 0 refills | Status: AC | PRN
  Filled 2020-10-07: qty 28, 7d supply, fill #0

## 2020-10-07 MED ORDER — LIDOCAINE 5 % EX PTCH
1.0000 | MEDICATED_PATCH | Freq: Two times a day (BID) | CUTANEOUS | 0 refills | Status: DC
  Filled 2020-10-07: qty 10, 5d supply, fill #0

## 2020-10-07 NOTE — TOC Transition Note (Signed)
Transition of Care (TOC) - CM/SW Discharge Note Marvetta Gibbons RN, BSN Transitions of Care Unit 4E- RN Case Manager See Treatment Team for direct phone #    Patient Details  Name: Erin Fields MRN: [id number] Date of Birth: [birth year]  Transition of Care [facility]) CM/SW Contact:  Dawayne Patricia, RN Phone Number: 10/07/2020, 3:02 PM   Clinical Narrative:    Pt stable for transition home today, orders placed for DME needs, family to transport home.  [employer] called for DME RW and 3n1 request. Per Freda Munro they are out of stock on RW, will plan to deliver 3n1 to room, Cm will provide RW from unit supply for [employer] to bill.  RW delivered to room for [employer], 3n1 to be delivered by [employer] prior to discharge.   No further needs noted.    Final next level of care: Home/Self Care Barriers to Discharge: No Barriers Identified   Patient Goals and CMS Choice    NA- in house provider to be used for DME    Discharge Placement               home        Discharge Plan and Services   Discharge Planning Services: CM Consult            DME Arranged: 3-N-1, Walker rolling DME Agency: [facility] Date DME Agency Contacted: 10/07/20 Time DME Agency Contacted: 56 Representative spoke with at DME Agency: Freda Munro HH Arranged: NA [place] Agency: NA        Social Determinants of Health ([place]) Interventions     Readmission Risk Interventions Readmission Risk Prevention Plan 10/07/2020  Transportation Screening Complete  PCP or Specialist Appt within 3-5 Days Complete  HRI or [place] Complete  Social Work Consult for [place] Planning/Counseling Complete  Palliative Care Screening Not Applicable  Medication Review Press photographer) Complete  Some recent data might be hidden

## 2020-10-07 NOTE — Discharge Summary (Signed)
Physician Discharge Summary  Patient ID: Keturah Yerby MRN: [id number] DOB/AGE: 57-Jan-1967 57 y.o.  Admit date: 10/02/2020 Discharge date: 10/07/2020  Admission Diagnoses:  Acute hypoxemic respiratory failure associated with new onset of left pleural effusion - present on admission  Nonischemic cardiomyopathy with chronic systolic heart failure ejection fraction 25% -at baseline present on admission  Status post defibrillator placement 10/01/2020 Circulatory shock    Discharge Diagnoses:  Principal Problem:   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 Active Problems:   Hypothyroidism   Hyperlipidemia   Hypertension   Borderline diabetes   Controlled type 2 diabetes mellitus without complication, without long-term current use of insulin (HCC)   Anxiety and depression   GERD (gastroesophageal reflux disease)   Diabetes mellitus without complication (HCC)   Depression   Dilated cardiomyopathy (HCC) ejection fraction 25% by echocardiogram from January 2022   Congestive heart failure (HCC)   Anxiety   Hemothorax on left   S/P evacuation of hematoma   Hypovolemic shock (HCC)   Discharged Condition: stable  History of Present Illness:  Patient is a 55 year old morbidly obese female with history of dilated nonischemic cardiomyopathy who has been referred for emergent surgical consultation due to development of left hemothorax, likely perforation of the right ventricle, and hypovolemic shock.   Patient underwent placement of single lead ICD October 01, 2020 by Dr. Curt Bears and presented to [facility] earlier today with severe chest pain and shortness of breath.  Chest x-ray revealed left hemothorax and blood work revealed anemia.  The patient was notably tachycardic and hypotensive, consistent with hypovolemic shock.  Noncontrast CT scan of the chest revealed perforation of the right ventricle with the tip of the patient's ventricular lead extending outside  of the heart into the left pleural space.  The patient was promptly transferred to [facility] where she was immediately evaluated upon her arrival in the emergency department.  Patient complains of severe resting shortness of breath and pain across her left chest.  She is gasping for air.  Symptoms of chest pain reportedly began the evening before and became acutely worse earlier this morning.  Remainder of her review of systems is noncontributory.  Hospital Course:  Ms. Lanphier was taken to the operating room emergently.  The existing ICD and lead were removed.  A median sternotomy was performed and the large left hemothorax was evacuated.  The perforation in the right ventricle was repaired.  Additionally, there was a laceration of the left fifth intercostal artery which was also repaired.  Following the procedure, she was transferred to the surgical ICU.  She remained hemodynamically stable.  She was weaned from the ventilator and extubated in the evening on the day of surgery.  She was started back on her carvedilol on postop day 1.  She was diuresed over the next few days with IV and oral Lasix.  The chest tube drainage slowed and subsided allowing the chest tubes to be removed on postop day 3.  She was followed by cardiology service and heart failure medications were managed by their team.  She was transferred to progressive care on postop day 3.  She was mobilized and made satisfactory progress with ambulation. Supplemental oxygen was weaned without difficulty.  Diet was advanced and well-tolerated.  She had return of appropriate bowel bladder function. Her sternotomy incision and the left chest ICD incisions had expected bruising but were healing with no sign of complication at the time of discharge.  Consults: cardiology  Significant Diagnostic Studies:    ECHOCARDIOGRAM LIMITED REPORT     Patient Name:   ALIX LAHMANN Blakeman Date of Exam: 10/02/2020  Medical Rec #:  [id number]          Height:       63.5 in  Accession #:    [id number]        Weight:       156.0 lb  Date of Birth:  14-Dec-1965          BSA:          1.750 m  Patient Age:    55 years          BP:           76/56 mmHg  Patient Gender: F                 HR:           121 bpm.  Exam Location:  Inpatient   Procedure: Limited Echo   STAT ECHO   Indications:    Pericardial Effusion     History:        Patient has prior history of Echocardiogram examinations,  most                  recent 07/23/2020. CHF, CAD; Signs/Symptoms:Chest Pain. AICD                  implanted on 10/01/2020.     Sonographer:    Darlina Sicilian RDCS  Referring Phys: [place]     1. Left ventricular ejection fraction, by estimation, is 30 to 35%. The  left ventricle has moderately decreased function.   2. Diastolic RV collapse is noted. There is a moderate-sized echogenic  fluid collection overlying the RV, suggestive of hemopericardium.. Right  ventricular systolic function is hyperdynamic. The right ventricular size  is small.   3. Echogenic fluid collection over the RV concering for hemopericardium.  Tamponade physiology is suspected given hypotension, RA/RV collapse,  non-dilated IVC but it does not collapse. Moderate pericardial effusion.  The pericardial effusion is localized  near the right ventricle and surrounding the apex. Findings are consistent  with possible cardiac tamponade.   4. The inferior vena cava is normal in size with <50% respiratory  variability, suggesting right atrial pressure of 8 mmHg.   Comparison(s): Changes from prior study are noted. 07/23/2020: LVEF 20-25%,  no pericardial effusion, IVC normal.   Conclusion(s)/Recommendation(s): Critical findings reported to Dr. Caryl Comes  and acknowledged at 1:25 pm on 10/02/2020. He reported a CT scan  demonstrated a perforated ICD lead - CT surgery is aware.   FINDINGS   Left Ventricle: Left ventricular ejection fraction, by  estimation, is 30  to 35%. The left ventricle has moderately decreased function.   Right Ventricle: Diastolic RV collapse is noted. There is a  modearate-sized echogenic fluid collection overlying the RV, suggestive of  hemopericardium. The right ventricular size is small. No increase in right  ventricular wall thickness. Right  ventricular systolic function is hyperdynamic.   Pericardium: Echogenic fluid collection over the RV concering for  hemopericardium. Tamponade physiology is suspected given hypotension,  RA/RV collapse, non-dilated IVC but it does not collapse. A moderately  sized pericardial effusion is present. The  pericardial effusion is localized near the right ventricle and surrounding  the apex. There is diastolic collapse of the right ventricular free wall  and diastolic collapse of the right  atrial wall. There is evidence of  cardiac tamponade.   Venous: The inferior vena cava is normal in size with less than 50%  respiratory variability, suggesting right atrial pressure of 8 mmHg.   Lyman Bishop MD  Electronically signed by Lyman Bishop MD  Signature Date/Time: 10/02/2020/1:33:23 PM       CLINICAL DATA:  Recent ICD implantation. Patient complains of chest pain and weakness. Chest radiograph demonstrates a large new left pleural effusion. In addition, concern for abnormal position the ICD lead.   EXAM: CT CHEST WITHOUT CONTRAST   TECHNIQUE: Multidetector CT imaging of the chest was performed following the standard protocol without IV contrast.   COMPARISON:  Chest radiograph 10/02/2020   FINDINGS: Cardiovascular: Left chest single lead ICD. Left subclavian lead extends down the SVC into the right heart and the tip is lateral to the right ventricle and suggestive for a cardiac perforation. Heart and mediastinum are slightly shifted towards the right due to the left pleural fluid and left lung densities. Evidence for coronary artery calcifications. No  significant pericardial effusion.   Mediastinum/Nodes: Mediastinal is shifted towards the right side of the chest. No significant lymph node enlargement in the chest. Surgical clips in the right axilla.   Lungs/Pleura: Extensive volume loss in the left lung related to a large pleural effusion and bulky hyperdense areas along the periphery of the lung. Lung findings are suggestive for hemorrhage with a hemothorax. Patchy airspace densities in the aerated left lung could be related to atelectasis or hemorrhage. Right lung is clear except for atelectasis along the medial right upper lobe. No significant right pleural fluid.   Upper Abdomen: Evidence for a high-density tablet in the distal stomach region. No acute abnormality in the upper abdomen.   Musculoskeletal: Soft tissue edema and small amount of gas in the left chest compatible with recent ICD placement. No acute bone abnormality.   IMPRESSION: 1. Right ventricular ICD lead perforation. ICD lead is located left of the right ventricle and appears to be outside of the heart. Large amount of high-density material within the left lung is compatible with hemorrhage or hematoma. New left pleural effusion is compatible with a hemothorax. 2. Mild mediastinal shift towards the right due to the blood products in the left hemithorax.   Critical Value/emergent results were called by telephone at the time of interpretation on 10/02/2020 at 1:19 pm to provider Jolyn Nap, MD, who verbally acknowledged these results.     Electronically Signed   By: Markus Daft M.D.   On: 10/02/2020 13:40    Treatments:           CARDIOTHORACIC SURGERY OPERATIVE NOTE   Date of Procedure:                            10/02/2020   Preoperative Diagnosis:                   Iatrogenic perforation of right ventricle secondary to transvenous defibrillator lead Left hemothorax Hypovolemic shock   Postoperative Diagnosis:                same    Procedure:                                          Median sternotomy Removal of implantable cardiac defibrillator Repair of perforated right  ventricle Repair of laceration of left 5th intercostal artery Evacuation of left hemothorax    Surgeon:                                            Valentina Gu. Roxy Manns, MD   Assistant:                                           Dineen Kid, CRNFA   Anesthesia:                                        Roberts Gaudy, MD   Operative Findings:   Perforation of distal right and left ventricle close to the left anterior descending coronary artery with defibrillator lead traversing into the left pleural space and into the chest wall, causing laceration of left 5th intercostal artery Massive left hemothorax         BRIEF CLINICAL NOTE AND INDICATIONS FOR SURGERY   Patient is a 55 year old morbidly obese female with history of dilated nonischemic cardiomyopathy who has been referred for emergent surgical consultation due to development of left hemothorax, likely perforation of the right ventricle, and hypovolemic shock.   Patient underwent placement of single lead ICD October 01, 2020 by Dr. Curt Bears and presented to [facility] earlier today with severe chest pain and shortness of breath.  Chest x-ray revealed left hemothorax and blood work revealed anemia.  The patient was notably tachycardic and hypotensive, consistent with hypovolemic shock.  Noncontrast CT scan of the chest revealed perforation of the right ventricle with the tip of the patient's ventricular lead extending outside of the heart into the left pleural space.  The patient was promptly transferred to [facility] where she was immediately evaluated upon her arrival in the emergency department.   The patient has been seen in consultation and counseled regarding the indications, risks and potential benefits of surgery.  All questions have been answered, and the patient  provides full informed consent for the operation as described.        Discharge Exam: Blood pressure 139/83, pulse 85, temperature 97.6 F (36.4 C), temperature source Oral, resp. rate 17, weight 77.9 kg, SpO2 99 %.  General appearance: alert, cooperative, and no distress Neurologic: intact Heart: NSR, rare PVC's Lungs: breath sounds are clear. Wound: the sternotomy incision and left chest ICD incision are both open to air, are dry and intact.  Disposition: Discharge to home in stable condition   Allergies as of 10/07/2020       Reactions   Losartan Potassium Shortness Of Breath, Other (See Comments)   Ace Inhibitors Cough   Aspirin Other (See Comments)   Upset stomach   Cefaclor Hives, Other (See Comments)   Oxycodone Hives   Sulfa Antibiotics Hives   Lisinopril Cough, Other (See Comments)        Medication List     STOP taking these medications    HYDROcodone-acetaminophen 5-325 MG tablet Commonly known as: NORCO/VICODIN       TAKE these medications    acyclovir 400 MG tablet Commonly known as: ZOVIRAX TAKE 1 TABLET (400 MG TOTAL) BY MOUTH 3 (THREE) TIMES DAILY  AS NEEDED. What changed: reasons to take this   Adalimumab 80 MG/0.8ML Pnkt Inject 80 mg into the skin every 14 (fourteen) days.   albuterol 108 (90 Base) MCG/ACT inhaler Commonly known as: VENTOLIN HFA Inhale 2 puffs into the lungs every 6 (six) hours as needed for shortness of breath.   ascorbic acid 1000 MG tablet Commonly known as: VITAMIN C Take 1,000 mg by mouth daily.   aspirin EC 81 MG tablet Take 81 mg by mouth daily. Swallow whole.   Azelastine HCl 0.15 % Soln Place 2 sprays into both nostrils 2 (two) times daily. For 30 days   Biofreeze Colorless 4 % Gel Generic drug: Menthol (Topical Analgesic) Apply 1 application topically daily as needed (Hip pain).   Biotin 1000 MCG tablet Take 1,000 mcg by mouth daily.   Calcium+D3 600-800 MG-UNIT Tabs Generic drug: Calcium  Carb-Cholecalciferol Take 1 tablet by mouth daily.   carvedilol 12.5 MG tablet Commonly known as: COREG Take 1 tablet (12.5 mg total) by mouth 2 (two) times daily.   cetirizine 10 MG tablet Commonly known as: ZYRTEC Take 10 mg by mouth daily.   cyclobenzaprine 5 MG tablet Commonly known as: FLEXERIL Take 1 tablet (5 mg total) by mouth 3 (three) times daily as needed for up to 7 days for muscle spasms.   diphenoxylate-atropine 2.5-0.025 MG tablet Commonly known as: LOMOTIL Take 2 tablets by mouth 4 (four) times daily as needed for diarrhea or loose stools.   doxycycline 100 MG capsule Commonly known as: VIBRAMYCIN Take 1 capsule (100 mg total) by mouth 2 (two) times daily.   Entresto 49-51 MG Generic drug: sacubitril-valsartan TAKE 1 TABLET BY MOUTH TWICE A DAY   EPINEPHrine 0.3 mg/0.3 mL Soaj injection Commonly known as: EPI-PEN Inject 0.3 mg into the muscle as needed for anaphylaxis.   exemestane 25 MG tablet Commonly known as: AROMASIN Take 25 mg by mouth daily after breakfast.   Glucosamine-Chondroitin Caps Take 1 capsule by mouth daily.   Hyoscyamine Sulfate 0.375 MG Tbcr Take 0.375 mg by mouth in the morning and at bedtime.   lansoprazole 15 MG disintegrating tablet Commonly known as: PREVACID SOLUTAB Take 15 mg by mouth 2 (two) times daily before a meal.   levothyroxine 75 MCG tablet Commonly known as: SYNTHROID Take 1 tablet (75 mcg total) by mouth daily before breakfast.   MAGNESIUM PO Take 250 mg by mouth daily.   meloxicam 15 MG tablet Commonly known as: MOBIC Take 15 mg by mouth daily as needed for pain (Hip pain).   mesalamine 1.2 g EC tablet Commonly known as: LIALDA Take 2.4 g by mouth in the morning and at bedtime.   metFORMIN 500 MG tablet Commonly known as: GLUCOPHAGE Take 1 tablet (500 mg total) by mouth 2 (two) times daily with a meal.   montelukast 10 MG tablet Commonly known as: SINGULAIR Take 10 mg by mouth at bedtime.   Omega  3 1000 MG Caps Take 2,000 mg by mouth every other day.   promethazine 25 MG tablet Commonly known as: PHENERGAN Take 0.5-1 tablets (12.5-25 mg total) by mouth daily as needed for nausea or vomiting.   rosuvastatin 20 MG tablet Commonly known as: CRESTOR Take 1 tablet (20 mg total) by mouth daily. What changed: when to take this   spironolactone 25 MG tablet Commonly known as: ALDACTONE Take 0.5 tablets (12.5 mg total) by mouth daily.   Symbicort 160-4.5 MCG/ACT inhaler Generic drug: budesonide-formoterol INHALE 2 PUFFS INTO THE LUNGS TWICE  A DAY What changed: See the new instructions.   traMADol 50 MG tablet Commonly known as: ULTRAM Take 1 tablet (50 mg total) by mouth every 6 (six) hours as needed for up to 7 days for moderate pain.   triamcinolone 55 MCG/ACT Aero nasal inhaler Commonly known as: NASACORT Place 2 sprays into the nose daily.   Turmeric 500 MG Caps Take 500 mg by mouth daily.   venlafaxine XR 150 MG 24 hr capsule Commonly known as: EFFEXOR-XR Take 1 capsule (150 mg total) by mouth daily with breakfast.   zinc gluconate 50 MG tablet Take 50 mg by mouth daily.   zolpidem 5 MG tablet Commonly known as: AMBIEN TAKE 1 TABLET BY MOUTH EVERY DAY AT BEDTIME AS NEEDED FOR SLEEP What changed: See the new instructions.               Durable Medical Equipment  (From admission, onward)           Start     Ordered   10/07/20 0911  For home use only DME 3 n 1  Once        10/07/20 0910   10/06/20 1533  For home use only DME Walker rolling  Once       Question Answer Comment  Walker: With [employer] Wheels   Patient needs a walker to treat with the following condition Imbalance      10/06/20 1533            Follow-up Information     [facility] [place]. Go on 10/12/2020.   Specialty: Cardiothoracic Surgery Why: Your appointment for suture removal is at 11:30am. Contact information: [place],  [place] [phone]        [facility] [place]. Go on 11/01/2020.   Specialty: Cardiothoracic Surgery Why: Your appointment is at 1:30pm.  Please arrive 30 minutes early for a chest x-ray to be performed by [facility] located on the first floor of the same buildiing. Contact information: [place], [place]        Park Liter, MD. Go on 11/04/2020.   Specialty: Cardiology Why: Your appointment is at 9:20am. Contact information: [place] [address] [phone]         Larey Dresser, MD. Go on 11/12/2020.   Specialty: Cardiology Why: Your appointment is at 9:40am. Contact information: [address] [place] [address] [phone]                 Signed: Antony Odea, PA-C 10/07/2020, 9:28 AM

## 2020-10-07 NOTE — Progress Notes (Signed)
Physical Therapy Treatment Patient Details Name: Erin Fields MRN: [id number] DOB: [birth year] Today's Date: 10/07/2020    History of Present Illness pt is a 55 y/o female presented to ED 6/17 with severe chest pain and SOB.  Chest xray revealed L hemothorax.  CT scan revealed perforation of the right ventricle withthe tip of the patient's ventricular lead extending outside of the heart into the pleural space.  6/18  s/p median sternotomy, removal of the ICD, repair of the perforated right ventricle and lac to the left 5th intercostal artery plus evac of the left hemothorax. Sedated on vent post op, extubated 6/18.  PMHx: includes, DM, Cervical CA, CHF, dilatated CM, Crohn's ds, Breast CA, UC    PT Comments    Pt received in supine, agreeable to therapy session and with good participation and tolerance for transfer, gait and stair training in room. Pt instructed on car transfer safety via demo and performed x2 steps per home setup to enter with up to min guard and family present for instruction on guarding positions for pt. Reviewed safety with move in the tube including for pt questions re: ADL tasks and pt performed seated leaning/weight shifting during tasks with no LOB without AD and also static standing with Supervision no LOB. Handouts given as detailed below to reinforce instruction. Pt continues to benefit from PT services to progress toward functional mobility goals.     Follow Up Recommendations  No PT follow up;Supervision - Intermittent     Equipment Recommendations  Rolling walker with 5" wheels;3in1 (PT)    Recommendations for Other Services       Precautions / Restrictions Precautions Precautions: Fall;Sternal Precaution Booklet Issued: Yes (comment) Precaution Comments: handout on HEP post CABG, Sternal Precs, Stairs, Move in the Tube Restrictions Weight Bearing Restrictions: No Other Position/Activity Restrictions: sternal precs    Mobility  Bed Mobility Overal  bed mobility: Needs Assistance Bed Mobility: Rolling;Sidelying to Sit Rolling: Min guard Sidelying to sit: Min guard       General bed mobility comments: cued/demo'd and practiced technique and trying to build more momentum. Cues for hand/arm placement within sternal precautions (move in the tube) if needing to get up unassisted.    Transfers Overall transfer level: Needs assistance Equipment used: None Transfers: Sit to/from Stand Sit to Stand: Supervision         General transfer comment: pt stood well from EOB x2 reps without cues good compliance with precs  Ambulation/Gait Ambulation/Gait assistance: Supervision Gait Distance (Feet): 20 Feet Assistive device: Rolling walker (2 wheeled) Gait Pattern/deviations: Step-through pattern     General Gait Details: steady with R LE antalgic gait pattern due to bad right hip, no LOB; distance limited due to pt wanting to get dressed and ready for DC   Stairs Stairs: Yes Stairs assistance: Min guard Stair Management: With walker;Forwards;Step to pattern Number of Stairs: 2 General stair comments: cues for sequencing with painful hip and safety with AD use, used RW for 7" step ascent/descent as pt has platform step onto porch; no LOB, handout given to reinforce sequence.   Wheelchair Mobility    Modified Rankin (Stroke Patients Only)       Balance Overall balance assessment: No apparent balance deficits (not formally assessed)                                          Cognition Arousal/Alertness:  Awake/alert Behavior During Therapy: WFL for tasks assessed/performed Overall Cognitive Status: Within Functional Limits for tasks assessed                                        Exercises Other Exercises Other Exercises: post-CABG and TKA handouts given as pt reports hip/knee pain and wanting to strengthen; did not review individual exercises in full pt receptive to brief overview.     General Comments General comments (skin integrity, edema, etc.): VSS on RA      Pertinent Vitals/Pain Pain Assessment: Faces Faces Pain Scale: Hurts a little bit Pain Location: sternum Pain Descriptors / Indicators: Discomfort;Grimacing;Guarding Pain Intervention(s): Monitored during session;Premedicated before session;Repositioned;Other (comment) (recommended she use her chest binder from previous surgery for comfort if still fitting well if needed as well as red heart pillow)    Home Living                      Prior Function            PT Goals (current goals can now be found in the care plan section) Acute Rehab PT Goals Patient Stated Goal: home independent PT Goal Formulation: With patient Time For Goal Achievement: 10/20/20 Progress towards PT goals: Progressing toward goals    Frequency    Min 3X/week      PT Plan Current plan remains appropriate    Co-evaluation              AM-PAC PT "6 Clicks" Mobility   Outcome Measure  Help needed turning from your back to your side while in a flat bed without using bedrails?: A Little Help needed moving from lying on your back to sitting on the side of a flat bed without using bedrails?: A Little Help needed moving to and from a bed to a chair (including a wheelchair)?: A Little Help needed standing up from a chair using your arms (e.g., wheelchair or bedside chair)?: A Little Help needed to walk in hospital room?: A Little Help needed climbing 3-5 steps with a railing? : A Little 6 Click Score: 18    End of Session Equipment Utilized During Treatment: Gait belt Activity Tolerance: Patient tolerated treatment well Patient left: in bed;with call bell/phone within reach;with family/visitor present (seated EOB awaiting DME delivery for DC) Nurse Communication: Mobility status PT Visit Diagnosis: Other abnormalities of gait and mobility (R26.89);Pain Pain - part of body:  (sternum)     Time:  3267-1245 PT Time Calculation (min) (ACUTE ONLY): 30 min  Charges:  $Gait Training: 8-22 mins $Therapeutic Activity: 8-22 mins                     Ebenezer Mccaskey P., PTA Acute Rehabilitation Services Pager: [phone] Office: [place] 10/07/2020, 10:30 AM

## 2020-10-07 NOTE — Progress Notes (Signed)
CARDIAC REHAB PHASE I   D/c education completed with pt and family. Pt educated on importance of site care and monitoring incisions daily. Encouraged continued IS use, walks, and sternal precautions. Pt given in-the-tube sheet, HF booklet, and heart healthy diet. Reviewed restrictions, exercise guidelines, and importance of daily weights. Pt waiting on DME to be delivered to room. Will refer to [facility].   [id number] Rufina Falco, RN BSN 10/07/2020 9:38 AM

## 2020-10-07 NOTE — Progress Notes (Signed)
Discharge instructions provided to patient. Medication changes, incision care and follow-up appointments reviewed. All questions answered. IV removed. Patient to be escorted home by her son.   Gailen Shelter RN

## 2020-10-07 NOTE — Telephone Encounter (Signed)
Made son aware paperwork has been received and completed. Aware I will have medical records fax completed forms tomorrow when I return to office. Son agreeable to plan and appreciates the call/update.

## 2020-10-07 NOTE — Telephone Encounter (Signed)
Per phase I cardiac rehab, fax cardiac rehab referral to [facility]. 

## 2020-10-07 NOTE — Progress Notes (Signed)
[address]            [place] [address]          [phone]   5 Days Post-Op Procedure(s) (LRB): MEDIAN STERNOTOMY REPAIR PERFORATED VENTRICLE EVACUATION LEFT HEMOTHORAX (N/A) TRANSESOPHAGEAL ECHOCARDIOGRAM (TEE) (N/A) ICD LEAD REMOVAL (N/A) GENERATOR REMOVAL  Total Length of Stay:  LOS: 5 days   Awake and alert, family at the bedside. Overall more comfortable today. Remains on RA with adequate O2 sats.  Independent with mobility.    Objective: Vital signs in last 24 hours: Temp:  [97.6 F (36.4 C)-98.7 F (37.1 C)] 97.6 F (36.4 C) (06/23 0746) Pulse Rate:  [82-87] 85 (06/23 0746) Cardiac Rhythm: Normal sinus rhythm (06/22 1908) Resp:  [16-20] 17 (06/23 0746) BP: (124-140)/(67-83) 139/83 (06/23 0746) SpO2:  [96 %-100 %] 98 % (06/23 0746)  Filed Weights   10/03/20 0500 10/04/20 0500 10/05/20 0338  Weight: 79 kg 77.2 kg 77.9 kg    Weight change:      Intake/Output from previous day: 06/22 0701 - 06/23 0700 In: 240 [P.O.:240] Out: -   Intake/Output this shift: No intake/output data recorded.  Current Meds: Scheduled Meds:  acetaminophen  1,000 mg Oral Q6H   aspirin EC  81 mg Oral Daily   bisacodyl  10 mg Oral Daily   Or   bisacodyl  10 mg Rectal Daily   carvedilol  12.5 mg Oral BID   Chlorhexidine Gluconate Cloth  6 each Topical Daily   docusate sodium  100 mg Oral BID   enoxaparin (LOVENOX) injection  40 mg Subcutaneous QHS   furosemide  40 mg Oral Daily   hyoscyamine  0.375 mg Oral BID   levothyroxine  75 mcg Oral QAC breakfast   lidocaine  1 patch Transdermal Q24H   mometasone-formoterol  2 puff Inhalation BID   montelukast  10 mg Oral QHS   pantoprazole  40 mg Oral Daily   polyethylene glycol  17 g Per Tube Daily   potassium chloride  20 mEq Oral BID   rosuvastatin  20 mg Oral QHS   sacubitril-valsartan  1 tablet Oral BID   sodium chloride flush  10-40 mL Intracatheter Q12H   sodium chloride flush  3 mL  Intravenous Q12H   spironolactone  12.5 mg Oral Daily   venlafaxine XR  150 mg Oral Q breakfast   Continuous Infusions:  sodium chloride     lactated ringers     PRN Meds:.cyclobenzaprine, dextrose, fentaNYL (SUBLIMAZE) injection, metoprolol tartrate, ondansetron (ZOFRAN) IV, sodium chloride flush, sodium chloride flush, traMADol  General appearance: alert, cooperative, and no distress Neurologic: intact Heart: NSR, rare PVC's Lungs: breath sounds are clear.  Wound: the sternotomy incision and left chest ICD incision are both open to air, are dry and intact.   Lab Results: CBC: Recent Labs    10/05/20 0052  WBC 14.0*  HGB 10.7*  HCT 31.9*  PLT 184    BMET:  Recent Labs    10/06/20 0614 10/07/20 0112  NA 139 137  K 3.7 3.7  CL 102 101  CO2 30 29  GLUCOSE 146* 126*  BUN 5* 6  CREATININE 0.47 0.58  CALCIUM 8.3* 8.4*     CMET: Lab Results  Component Value Date   WBC 14.0 (H) 10/05/2020   HGB 10.7 (L) 10/05/2020   HCT 31.9 (L) 10/05/2020   PLT 184  10/05/2020   GLUCOSE 126 (H) 10/07/2020   CHOL 128 08/10/2020   TRIG 153 (H) 08/10/2020   HDL 37 (L) 08/10/2020   LDLDIRECT 101.0 12/02/2018   LDLCALC 65 08/10/2020   ALT 14 10/02/2020   AST 15 10/02/2020   NA 137 10/07/2020   K 3.7 10/07/2020   CL 101 10/07/2020   CREATININE 0.58 10/07/2020   BUN 6 10/07/2020   CO2 29 10/07/2020   TSH 0.31 (L) 09/27/2020   INR 1.4 (H) 10/02/2020   HGBA1C 6.3 09/27/2020      PT/INR:  No results for input(s): LABPROT, INR in the last 72 hours.  Radiology: No results found.   Assessment/Plan: S/P Procedure(s) (LRB): MEDIAN STERNOTOMY REPAIR PERFORATED VENTRICLE EVACUATION LEFT HEMOTHORAX (N/A) TRANSESOPHAGEAL ECHOCARDIOGRAM (TEE) (N/A) ICD LEAD REMOVAL (N/A) GENERATOR REMOVAL  -POD6 emergency sternotomy for evacuation of left hemothorax, repair of RV perforation and repair of laceration of left 5th intercostal artery.  BP and cardiac rhythm are stable. Independent  with mobility.   -H/O non-ischemic cardiomyopathy, EF 25%- medical mgt per cardiology. PVC's have resolved.  -Volume excess-  Wt 1kg below pre-op. No new Wt today. No further Lasix.  -Expected acute blood loss anemia- mild. Hct is stable.   -Disposition- discharge to home today. Follow up arranged with our office, her primary cardiologist, and with the Heart Failure clinic.   Antony Odea, PA-C [phone] 10/07/2020 7:48 AM

## 2020-10-08 ENCOUNTER — Telehealth: Payer: Self-pay | Admitting: Family Medicine

## 2020-10-08 ENCOUNTER — Telehealth: Payer: Self-pay | Admitting: Cardiology

## 2020-10-08 NOTE — Telephone Encounter (Signed)
HIM received completed form from Nurse which had been received by fax from [facility]. Called and spoke to patient to confirm the fax number to send the form to. The form has been completed and faxed as requested to [facility] 10/08/20

## 2020-10-12 ENCOUNTER — Ambulatory Visit: Payer: No Typology Code available for payment source

## 2020-10-12 ENCOUNTER — Other Ambulatory Visit: Payer: Self-pay

## 2020-10-12 ENCOUNTER — Ambulatory Visit (INDEPENDENT_AMBULATORY_CARE_PROVIDER_SITE_OTHER): Payer: Self-pay | Admitting: *Deleted

## 2020-10-12 DIAGNOSIS — Z4802 Encounter for removal of sutures: Secondary | ICD-10-CM

## 2020-10-12 NOTE — Progress Notes (Signed)
Patient arrived for nurse visit to remove sutures post-sternotomy 6/18 by Dr. Roxy Manns.  Three sutures removed with no signs or symptoms of infection noted.  Patient tolerated suture removal well.  Patient and family instructed to keep the incision site clean and dry. Patient and family acknowledged instructions given.  All questions answered.

## 2020-10-15 ENCOUNTER — Encounter: Payer: Self-pay | Admitting: Internal Medicine

## 2020-10-15 ENCOUNTER — Ambulatory Visit (INDEPENDENT_AMBULATORY_CARE_PROVIDER_SITE_OTHER): Payer: No Typology Code available for payment source | Admitting: Internal Medicine

## 2020-10-15 ENCOUNTER — Other Ambulatory Visit: Payer: Self-pay

## 2020-10-15 VITALS — BP 100/62 | HR 100 | Temp 97.1°F | Resp 18 | Ht 63.5 in | Wt 148.0 lb

## 2020-10-15 DIAGNOSIS — I420 Dilated cardiomyopathy: Secondary | ICD-10-CM

## 2020-10-15 DIAGNOSIS — I10 Essential (primary) hypertension: Secondary | ICD-10-CM

## 2020-10-15 DIAGNOSIS — S2699XA Other injury of heart, unspecified with or without hemopericardium, initial encounter: Secondary | ICD-10-CM | POA: Diagnosis not present

## 2020-10-15 DIAGNOSIS — I9789 Other postprocedural complications and disorders of the circulatory system, not elsewhere classified: Secondary | ICD-10-CM | POA: Diagnosis not present

## 2020-10-15 LAB — CBC
HCT: 35.3 % — ABNORMAL LOW (ref 36.0–46.0)
Hemoglobin: 12 g/dL (ref 12.0–15.0)
MCHC: 33.9 g/dL (ref 30.0–36.0)
MCV: 91.1 fl (ref 78.0–100.0)
Platelets: 879 10*3/uL — ABNORMAL HIGH (ref 150.0–400.0)
RBC: 3.87 Mil/uL (ref 3.87–5.11)
RDW: 13.4 % (ref 11.5–15.5)
WBC: 16.9 10*3/uL — ABNORMAL HIGH (ref 4.0–10.5)

## 2020-10-15 LAB — BASIC METABOLIC PANEL
BUN: 9 mg/dL (ref 6–23)
CO2: 25 mEq/L (ref 19–32)
Calcium: 10.3 mg/dL (ref 8.4–10.5)
Chloride: 95 mEq/L — ABNORMAL LOW (ref 96–112)
Creatinine, Ser: 0.55 mg/dL (ref 0.40–1.20)
GFR: 101.71 mL/min (ref >60.00)
Glucose, Bld: 196 mg/dL — ABNORMAL HIGH (ref 70–99)
Potassium: 4.7 mEq/L (ref 3.5–5.1)
Sodium: 131 mEq/L — ABNORMAL LOW (ref 135–145)

## 2020-10-15 NOTE — Patient Instructions (Addendum)
Hold spironolactone for few days  If your blood pressure continue to be low, please reach out to cardiology  Check the  blood pressure  daily BP GOAL is between 110/65 and  135/85. If it is consistently higher or lower, let me know  ER if: Increasing difficulty breathing, increasing chest pain, fever chills,   GO TO THE LAB : Get the blood work     [place], [place] back for a checkup with Dr. Jackie Plum in 4 weeks

## 2020-10-15 NOTE — Progress Notes (Signed)
Subjective:    Patient ID: Erin Fields, female    DOB: 1965/05/24, 55 y.o.   MRN: [id number]  DOS:  10/15/2020 Type of visit - description: Hospital follow-up, here with her son  Admitted to the hospital 10/02/2020, discharge 10/07/2020. Patient with history of dilated nonischemic cardiomyopathy, developed left pneumothorax,  from perforation of the right ventricle at the time of ICD placement on June 17. The patient was admitted on hypovolemic shock, severe chest pain and difficulty breathing. She was taken to the operating room emergently, ICD and lead removed, median sternotomy performed, large left hemothorax evacuated.  Ventricle repaired. Left fifth intercostal artery repair.   Review of Systems Since she left the hospital, has noted her BP is in the low side, sometimes 90, sometimes 100. No fever chills Ambulatory blood sugars were initially elevated but now are not better in the 100s. Still having some pain between the shoulder blades, this is started immediately after the ICD implant and is slightly better. Other than the surgical pain, denies chest pain. Still DOE. Denies any blood in the stools.  Past Medical History:  Diagnosis Date   Allergic rhinitis due to animal (cat) (dog) hair and dander 04/27/2020   Allergic rhinitis due to pollen 04/27/2020   Anxiety    Anxiety and depression 01/20/2018   Asthma    Asthma, persistent controlled 10/05/2018   B12 deficiency 11/27/2019   Borderline diabetes 12/17/2015   Cancer ([place]) 2019   right breast cancer   CERVICAL CANCER, HX OF 12/06/2006   Qualifier: Diagnosis of  By: Arnoldo Morale MD, John E    Chronic sinusitis 01/26/2015   Chronic venous insufficiency 09/02/2015   Colon polyps    Congestive heart failure ([place]) 04/29/2020   Controlled type 2 diabetes mellitus without complication, without long-term current use of insulin ([place]) 05/28/2017   Coronary artery disease 25% of RCA, 20% intermediate branch, 40% diagonal branch based on  cardiac cath from 2022 07/22/2020   Crohn disease ([place]) 11/29/2016   Depression    Diabetes mellitus without complication ([place])    type 2   Dilated cardiomyopathy ([place]) ejection fraction 25% by echocardiogram from January 2022 04/29/2020   Dyspnea 09/15/2014   Elevated triglycerides with high cholesterol 11/26/2013   Environmental allergies    Eustachian tube dysfunction 12/04/2014   Ex-smoker 11/18/2015   Family history of breast cancer    Genetic testing 07/16/2017   Negative genetic testing on the multi-cancer panel.  The Multi-Gene Panel offered by [name] includes sequencing and/or deletion duplication testing of the following 83 genes: ALK, APC, ATM, AXIN2,BAP1,  BARD1, BLM, BMPR1A, BRCA1, BRCA2, BRIP1, CASR, CDC73, CDH1, CDK4, CDKN1B, CDKN1C, CDKN2A (p14ARF), CDKN2A (p16INK4a), CEBPA, CHEK2, CTNNA1, DICER1, DIS3L2, EGFR (c.2369C>T, p.Thr790Met variant onl   GERD (gastroesophageal reflux disease)    HEADACHE 12/06/2006   Qualifier: Diagnosis of  By: Arnoldo Morale MD, John E    Hemothorax on left 10/02/2020   History of adenomatous polyp of colon 11/19/2017   Hyperlipidemia    Hypertension    Hypothyroidism    IDA (iron deficiency anemia) 11/06/2019   Impaired fasting glucose 12/12/2017   Insomnia 08/20/2013   Irritable bowel syndrome 11/29/2006   Qualifier: Diagnosis of  By: Arnoldo Morale MD, John E    Left sided abdominal pain 12/13/2017   Liver hemangioma 08/24/2014   Malignant neoplasm of upper-outer quadrant of right breast in female, estrogen receptor positive ([place]) 03/20/2017   Migraines    Mild persistent asthma, uncomplicated 04/18/5850   Nasal polyp, unspecified  01/10/2018   Oral herpes 04/20/2013   Other allergic rhinitis 09/15/2014   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 10/02/2020   Personal history of radiation therapy 2019   36 radiation tx   PONV (postoperative nausea and vomiting)    scopolamine patch helps   Recurrent infections 10/05/2018   Right upper lobe pulmonary nodule  01/10/2018   Symptomatic varicose veins, right 09/02/2015   Ulcerative colitis ([place])    Varicose veins     Past Surgical History:  Procedure Laterality Date   BREAST BIOPSY Right 02/2017   BREAST BIOPSY Right 11/07/2019   BREAST LUMPECTOMY Right 04/24/2017   BREAST LUMPECTOMY WITH RADIOACTIVE SEED AND SENTINEL LYMPH NODE BIOPSY Right 04/24/2017   Procedure: RIGHT BREAST LUMPECTOMY WITH RADIOACTIVE SEED AND SENTINEL LYMPH NODE BIOPSY;  Surgeon: [name], MD;  Location: [facility];  Service: General;  Laterality: Right;   crapal tunnel relase Left 05/2018   ENDOMETRIAL ABLATION  2010   Had abnormal uterine bleeding (heavy and frequent), performed in [place], menses stopped completely 2 years after   EXPLORATORY LAPAROTOMY     Had exploratory surgery at the age of 37 for abdominal pain with cyst found on her left ovary, not treated surgically   FOOT SURGERY Right 2012   GENERATOR REMOVAL  10/02/2020   Procedure: GENERATOR REMOVAL;  Surgeon: Rexene Alberts, MD;  Location: [facility];  Service: Thoracic;;   HAND SURGERY Right    ICD IMPLANT N/A 10/01/2020   Procedure: ICD IMPLANT;  Surgeon: Constance Haw, MD;  Location: [facility];  Service: Cardiovascular;  Laterality: N/A;   ICD LEAD REMOVAL N/A 10/02/2020   Procedure: ICD LEAD REMOVAL;  Surgeon: Rexene Alberts, MD;  Location: [facility];  Service: Thoracic;  Laterality: N/A;   MEDIASTERNOTOMY N/A 10/02/2020   Procedure: MEDIAN STERNOTOMY REPAIR PERFORATED VENTRICLE EVACUATION LEFT HEMOTHORAX;  Surgeon: Rexene Alberts, MD;  Location: [facility];  Service: Thoracic;  Laterality: N/A;   RE-EXCISION OF BREAST LUMPECTOMY Right 05/15/2017   Procedure: RE-EXCISION OF BREAST LUMPECTOMY;  Surgeon: [name], MD;  Location: [facility];  Service: General;  Laterality: Right;   RIGHT/LEFT HEART CATH AND CORONARY ANGIOGRAPHY N/A 06/21/2020   Procedure: RIGHT/LEFT HEART CATH AND CORONARY ANGIOGRAPHY;  Surgeon: Nelva Bush, MD;  Location: [facility];  Service: Cardiovascular;  Laterality: N/A;   ROBOTIC ASSISTED SALPINGO OOPHERECTOMY Bilateral 08/29/2019   Procedure: XI ROBOTIC ASSISTED SALPINGO OOPHORECTOMY;  Surgeon: [name], MD;  Location[facility];  Service: Gynecology;  Laterality: Bilateral;   Septoplasty with turbinate reduction     TEE WITHOUT CARDIOVERSION N/A 10/02/2020   Procedure: TRANSESOPHAGEAL ECHOCARDIOGRAM (TEE);  Surgeon: Rexene Alberts, MD;  Location: [facility];  Service: Thoracic;  Laterality: N/A;   [place]     Removal Right Leg   WISDOM TOOTH EXTRACTION      Allergies as of 10/15/2020       Reactions   Losartan Potassium Shortness Of Breath, Other (See Comments)   Ace Inhibitors Cough   Aspirin Other (See Comments)   Upset stomach   Cefaclor Hives, Other (See Comments)   Oxycodone Hives   Sulfa Antibiotics Hives   Lisinopril Cough, Other (See Comments)        Medication List        Accurate as of October 15, 2020 11:30 AM. If you have any questions, ask your nurse or doctor.  STOP taking these medications    doxycycline 100 MG capsule Commonly known as: VIBRAMYCIN Stopped by: Kathlene November, MD       TAKE these medications    acyclovir 400 MG tablet Commonly known as: ZOVIRAX TAKE 1 TABLET (400 MG TOTAL) BY MOUTH 3 (THREE) TIMES DAILY AS NEEDED.   Adalimumab 80 MG/0.8ML Pnkt Inject 80 mg into the skin every 14 (fourteen) days.   albuterol 108 (90 Base) MCG/ACT inhaler Commonly known as: VENTOLIN HFA Inhale 2 puffs into the lungs every 6 (six) hours as needed for shortness of breath.   ascorbic acid 1000 MG tablet Commonly known as: VITAMIN C Take 1,000 mg by mouth daily.   aspirin EC 81 MG tablet Take 81 mg by mouth daily. Swallow whole.   Azelastine HCl 0.15 % Soln Place 2 sprays into both nostrils 2 (two) times daily. For 30 days   Biofreeze Colorless 4 % Gel Generic drug:  Menthol (Topical Analgesic) Apply 1 application topically daily as needed (Hip pain).   Biotin 1000 MCG tablet Take 1,000 mcg by mouth daily.   Calcium+D3 600-800 MG-UNIT Tabs Generic drug: Calcium Carb-Cholecalciferol Take 1 tablet by mouth daily.   carvedilol 12.5 MG tablet Commonly known as: COREG Take 1 tablet (12.5 mg total) by mouth 2 (two) times daily.   cetirizine 10 MG tablet Commonly known as: ZYRTEC Take 10 mg by mouth daily.   diphenoxylate-atropine 2.5-0.025 MG tablet Commonly known as: LOMOTIL Take 2 tablets by mouth 4 (four) times daily as needed for diarrhea or loose stools.   Entresto 49-51 MG Generic drug: sacubitril-valsartan TAKE 1 TABLET BY MOUTH TWICE A DAY   EPINEPHrine 0.3 mg/0.3 mL Soaj injection Commonly known as: EPI-PEN Inject 0.3 mg into the muscle as needed for anaphylaxis.   exemestane 25 MG tablet Commonly known as: AROMASIN Take 25 mg by mouth daily after breakfast.   Glucosamine-Chondroitin Caps Take 1 capsule by mouth daily.   Hyoscyamine Sulfate 0.375 MG Tbcr Take 0.375 mg by mouth in the morning and at bedtime.   lansoprazole 15 MG disintegrating tablet Commonly known as: PREVACID SOLUTAB Take 15 mg by mouth 2 (two) times daily before a meal.   levothyroxine 75 MCG tablet Commonly known as: SYNTHROID Take 1 tablet (75 mcg total) by mouth daily before breakfast.   MAGNESIUM PO Take 250 mg by mouth daily.   meloxicam 15 MG tablet Commonly known as: MOBIC Take 15 mg by mouth daily as needed for pain (Hip pain).   mesalamine 1.2 g EC tablet Commonly known as: LIALDA Take 2.4 g by mouth in the morning and at bedtime.   metFORMIN 500 MG tablet Commonly known as: GLUCOPHAGE Take 1 tablet (500 mg total) by mouth 2 (two) times daily with a meal.   montelukast 10 MG tablet Commonly known as: SINGULAIR Take 10 mg by mouth at bedtime.   Omega 3 1000 MG Caps Take 2,000 mg by mouth every other day.   promethazine 25 MG  tablet Commonly known as: PHENERGAN Take 0.5-1 tablets (12.5-25 mg total) by mouth daily as needed for nausea or vomiting.   rosuvastatin 20 MG tablet Commonly known as: CRESTOR Take 1 tablet (20 mg total) by mouth daily.   spironolactone 25 MG tablet Commonly known as: ALDACTONE Take 0.5 tablets (12.5 mg total) by mouth daily.   Symbicort 160-4.5 MCG/ACT inhaler Generic drug: budesonide-formoterol INHALE 2 PUFFS INTO THE LUNGS TWICE A DAY   triamcinolone 55 MCG/ACT Aero nasal inhaler Commonly known as: NASACORT  Place 2 sprays into the nose daily.   Turmeric 500 MG Caps Take 500 mg by mouth daily.   venlafaxine XR 150 MG 24 hr capsule Commonly known as: EFFEXOR-XR Take 1 capsule (150 mg total) by mouth daily with breakfast.   zinc gluconate 50 MG tablet Take 50 mg by mouth daily.   zolpidem 5 MG tablet Commonly known as: AMBIEN TAKE 1 TABLET BY MOUTH EVERY DAY AT BEDTIME AS NEEDED FOR SLEEP           Objective:   Physical Exam BP 100/62 (BP Location: Left Arm, Patient Position: Sitting, Cuff Size: Normal)   Pulse 100   Temp (!) 97.1 F (36.2 C)   Resp 18   Ht 5' 3.5" (1.613 m)   Wt 148 lb (67.1 kg)   SpO2 98%   BMI 25.81 kg/m  General:   Well developed, NAD, BMI noted. HEENT:  Normocephalic . Face symmetric, atraumatic Lungs:  CTA B.  No decreased breath sounds at bases Normal respiratory effort, no intercostal retractions, no accessory muscle use. Chest: Sternotomy without pockets of fluid, no redness, warmness.  At the left chest where the ICD used to be: Area seems to be healing well, again no pockets of fluid, no redness or discharge. At the epigastric area, he has 3 wounds where the chest tubes were, no cellulitis, no abscess. Heart: RRR,  no murmur.  Lower extremities: no pretibial edema bilaterally  Skin: Bruises noted at the left chest, left shoulder and left breast Neurologic:  alert & oriented X3.  Speech normal, gait: Assisted by a walker.   Psych--  Cognition and judgment appear intact.  Cooperative with normal attention span and concentration.  Behavior appropriate. No anxious or depressed appearing.      Assessment     55 year old female, PMH includes DM, HTN, CHF, CAD, migraines, GERD, Crohn's disease, on multiple meds, presents for hospital follow-up.  Hemothorax, ventricular injury: See HPI for the summary of the hospital admission, she seems to be recuperating well.  She has appropriate postop pain.  No obvious wound infection that I can tell. Plan is to follow-up with surgery as recommended. HTN: Has been low since he left the hospital, it is 100/62 today. reviewed the records, BP was okay while in the hospital, patient thinks that at the time she was not getting Entresto. Current meds: Carvedilol, Aldactone, Entresto. Plan: Hold Aldactone, will notify Dr. Agustin Cree and see if he has any other recommendation.  Monitor BPs.  If they remain low recommend to reach out to cardiology during the weekend. BMP, CBC RTC 4 weeks  This visit occurred during the SARS-CoV-2 public health emergency.  Safety protocols were in place, including screening questions prior to the visit, additional usage of staff PPE, and extensive cleaning of exam room while observing appropriate contact time as indicated for disinfecting solutions.

## 2020-10-17 ENCOUNTER — Other Ambulatory Visit: Payer: Self-pay | Admitting: Cardiology

## 2020-10-25 ENCOUNTER — Telehealth: Payer: Self-pay | Admitting: Cardiology

## 2020-10-25 NOTE — Telephone Encounter (Signed)
Caryl Pina is calling to inform Dr. Curt Bears she is available for any ongoing complex care concerns or resources this pt may need.

## 2020-10-26 ENCOUNTER — Telehealth: Payer: Self-pay

## 2020-10-26 NOTE — Telephone Encounter (Signed)
Patient contacted the office concerned if drainage, serosanguinous, was normal from  her chest tube incision sites. She is s/p Median Sternotomy repair perforated ventricle evacuation hemothorax by Dr. Roxy Manns 10/02/20. Advised that some clear drainage from the sites is normal and can happen. Advised to give the office a call if the site shows any signs of infection or if she has any worsening symptoms, and/ or purulent drainage. She acknowledged receipt.

## 2020-10-27 ENCOUNTER — Other Ambulatory Visit: Payer: Self-pay | Admitting: Cardiology

## 2020-10-27 ENCOUNTER — Other Ambulatory Visit: Payer: Self-pay | Admitting: Hematology

## 2020-10-27 DIAGNOSIS — C50411 Malignant neoplasm of upper-outer quadrant of right female breast: Secondary | ICD-10-CM

## 2020-10-28 ENCOUNTER — Encounter: Payer: Self-pay | Admitting: Family Medicine

## 2020-10-28 DIAGNOSIS — M1610 Unilateral primary osteoarthritis, unspecified hip: Secondary | ICD-10-CM

## 2020-10-29 ENCOUNTER — Other Ambulatory Visit: Payer: Self-pay | Admitting: Thoracic Surgery (Cardiothoracic Vascular Surgery)

## 2020-10-29 DIAGNOSIS — Z9889 Other specified postprocedural states: Secondary | ICD-10-CM

## 2020-10-29 MED ORDER — HYDROCODONE-ACETAMINOPHEN 5-325 MG PO TABS: 1.0000 | ORAL_TABLET | Freq: Three times a day (TID) | ORAL | 0 refills | Status: DC | PRN

## 2020-11-01 ENCOUNTER — Ambulatory Visit
Admission: RE | Admit: 2020-11-01 | Discharge: 2020-11-01 | Disposition: A | Discharge status: Home / Self Care | Payer: No Typology Code available for payment source | Source: Ambulatory Visit | Attending: Thoracic Surgery (Cardiothoracic Vascular Surgery) | Admitting: Thoracic Surgery (Cardiothoracic Vascular Surgery)

## 2020-11-01 ENCOUNTER — Ambulatory Visit (INDEPENDENT_AMBULATORY_CARE_PROVIDER_SITE_OTHER): Payer: Self-pay | Admitting: Physician Assistant

## 2020-11-01 ENCOUNTER — Telehealth: Payer: Self-pay | Admitting: *Deleted

## 2020-11-01 ENCOUNTER — Other Ambulatory Visit: Payer: Self-pay

## 2020-11-01 VITALS — BP 137/86 | HR 99 | Resp 20 | Ht 63.5 in | Wt 149.4 lb

## 2020-11-01 DIAGNOSIS — Z9889 Other specified postprocedural states: Secondary | ICD-10-CM

## 2020-11-01 NOTE — Progress Notes (Signed)
[address]       [place], [id number]             [phone]        Erin Fields is a 55 y.o. female patient s/p evacuation of hematoma and she is here for her routine follow-up appointment.    1. S/P evacuation of hematoma    Past Medical History:  Diagnosis Date   Allergic rhinitis due to animal (cat) (dog) hair and dander 04/27/2020   Allergic rhinitis due to pollen 04/27/2020   Anxiety    Anxiety and depression 01/20/2018   Asthma    Asthma, persistent controlled 10/05/2018   B12 deficiency 11/27/2019   Borderline diabetes 12/17/2015   Cancer ([place]) 2019   right breast cancer   CERVICAL CANCER, HX OF 12/06/2006   Qualifier: Diagnosis of  By: Arnoldo Morale MD, John E    Chronic sinusitis 01/26/2015   Chronic venous insufficiency 09/02/2015   Colon polyps    Congestive heart failure ([place]) 04/29/2020   Controlled type 2 diabetes mellitus without complication, without long-term current use of insulin ([place]) 05/28/2017   Coronary artery disease 25% of RCA, 20% intermediate branch, 40% diagonal branch based on cardiac cath from 2022 07/22/2020   Crohn disease ([place]) 11/29/2016   Depression    Diabetes mellitus without complication ([place])    type 2   Dilated cardiomyopathy ([place]) ejection fraction 25% by echocardiogram from January 2022 04/29/2020   Dyspnea 09/15/2014   Elevated triglycerides with high cholesterol 11/26/2013   Environmental allergies    Eustachian tube dysfunction 12/04/2014   Ex-smoker 11/18/2015   Family history of breast cancer    Genetic testing 07/16/2017   Negative genetic testing on the multi-cancer panel.  The Multi-Gene Panel offered by [name] includes sequencing and/or deletion duplication testing of the following 83 genes: ALK, APC, ATM, AXIN2,BAP1,  BARD1, BLM, BMPR1A, BRCA1, BRCA2, BRIP1, CASR, CDC73, CDH1, CDK4, CDKN1B, CDKN1C, CDKN2A (p14ARF), CDKN2A (p16INK4a), CEBPA, CHEK2, CTNNA1, DICER1, DIS3L2, EGFR (c.2369C>T, p.Thr790Met variant onl    GERD (gastroesophageal reflux disease)    HEADACHE 12/06/2006   Qualifier: Diagnosis of  By: Arnoldo Morale MD, John E    Hemothorax on left 10/02/2020   History of adenomatous polyp of colon 11/19/2017   Hyperlipidemia    Hypertension    Hypothyroidism    IDA (iron deficiency anemia) 11/06/2019   Impaired fasting glucose 12/12/2017   Insomnia 08/20/2013   Irritable bowel syndrome 11/29/2006   Qualifier: Diagnosis of  By: Arnoldo Morale MD, John E    Left sided abdominal pain 12/13/2017   Liver hemangioma 08/24/2014   Malignant neoplasm of upper-outer quadrant of right breast in female, estrogen receptor positive ([place]) 03/20/2017   Migraines    Mild persistent asthma, uncomplicated 04/25/3233   Nasal polyp, unspecified 01/10/2018   Oral herpes 04/20/2013   Other allergic rhinitis 09/15/2014   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 Scheduled Meds: Current Outpatient Medications on File Prior to Visit  Medication Sig Dispense Refill   acyclovir (ZOVIRAX) 400 MG tablet TAKE 1 TABLET (400 MG TOTAL) BY  MOUTH 3 (THREE) TIMES DAILY AS NEEDED. 270 tablet 1   Adalimumab 80 MG/0.8ML PNKT Inject 80 mg into the skin every 14 (fourteen) days.     albuterol (VENTOLIN HFA) 108 (90 Base) MCG/ACT inhaler Inhale 2 puffs into the lungs every 6 (six) hours as needed for shortness of breath.     ascorbic acid (VITAMIN C) 1000 MG tablet Take 1,000 mg by mouth daily.     aspirin EC 81 MG tablet Take 81 mg by mouth daily. Swallow whole.     Azelastine HCl 0.15 % SOLN Place 2 sprays into both nostrils 2 (two) times daily. For 30 days     Biotin 1000 MCG  tablet Take 1,000 mcg by mouth daily.     Calcium Carb-Cholecalciferol (CALCIUM+D3) 600-800 MG-UNIT TABS Take 1 tablet by mouth daily.     carvedilol (COREG) 12.5 MG tablet Take 1 tablet (12.5 mg total) by mouth 2 (two) times daily. 60 tablet 2   cetirizine (ZYRTEC) 10 MG tablet Take 10 mg by mouth daily.     diphenoxylate-atropine (LOMOTIL) 2.5-0.025 MG tablet Take 2 tablets by mouth 4 (four) times daily as needed for diarrhea or loose stools.     ENTRESTO 49-51 MG TAKE 1 TABLET BY MOUTH TWICE A DAY 180 tablet 3   EPINEPHrine 0.3 mg/0.3 mL IJ SOAJ injection Inject 0.3 mg into the muscle as needed for anaphylaxis.     exemestane (AROMASIN) 25 MG tablet TAKE 1 TABLET (25 MG TOTAL) BY MOUTH DAILY AFTER BREAKFAST. 90 tablet 1   Glucosamine-Chondroit-Vit C-Mn (GLUCOSAMINE-CHONDROITIN) CAPS Take 1 capsule by mouth daily.     HYDROcodone-acetaminophen (NORCO/VICODIN) 5-325 MG tablet Take 1-2 tablets by mouth every 8 (eight) hours as needed. 20 tablet 0   Hyoscyamine Sulfate 0.375 MG TBCR Take 0.375 mg by mouth in the morning and at bedtime.     lansoprazole (PREVACID SOLUTAB) 15 MG disintegrating tablet Take 15 mg by mouth 2 (two) times daily before a meal.     levothyroxine (SYNTHROID) 75 MCG tablet Take 1 tablet (75 mcg total) by mouth daily before breakfast. 30 tablet 6   MAGNESIUM PO Take 250 mg by mouth daily.     meloxicam (MOBIC) 15 MG tablet Take 15 mg by mouth daily as needed for pain (Hip pain).     Menthol, Topical Analgesic, (BIOFREEZE COLORLESS) 4 % GEL Apply 1 application topically daily as needed (Hip pain).     mesalamine (LIALDA) 1.2 g EC tablet Take 2.4 g by mouth in the morning and at bedtime.      metFORMIN (GLUCOPHAGE) 500 MG tablet Take 1 tablet (500 mg total) by mouth 2 (two) times daily with a meal. 180 tablet 3   montelukast (SINGULAIR) 10 MG tablet Take 10 mg by mouth at bedtime.      Omega 3 1000 MG CAPS Take 2,000 mg by mouth every other day.     promethazine (PHENERGAN) 25  MG tablet Take 0.5-1 tablets (12.5-25 mg total) by mouth daily as needed for nausea or vomiting. 30 tablet 5   rosuvastatin (CRESTOR) 20 MG tablet Take 1 tablet (20 mg total) by mouth daily. 90 tablet 3   spironolactone (ALDACTONE) 25 MG tablet TAKE 1/2 TABLET BY MOUTH EVERY DAY 45 tablet 2   SYMBICORT 160-4.5 MCG/ACT inhaler INHALE 2 PUFFS INTO THE LUNGS TWICE A DAY 30.6 each 1   triamcinolone (NASACORT) 55 MCG/ACT AERO nasal inhaler Place 2 sprays into the nose daily.     Turmeric 500 MG  CAPS Take 500 mg by mouth daily.     venlafaxine XR (EFFEXOR-XR) 150 MG 24 hr capsule Take 1 capsule (150 mg total) by mouth daily with breakfast. 90 capsule 1   zinc gluconate 50 MG tablet Take 50 mg by mouth daily.     zolpidem (AMBIEN) 5 MG tablet TAKE 1 TABLET BY MOUTH EVERY DAY AT BEDTIME AS NEEDED FOR SLEEP 30 tablet 2   No current facility-administered medications on file prior to visit.     Allergies  Allergen Reactions   Losartan Potassium Shortness Of Breath and Other (See Comments)   Ace Inhibitors Cough   Aspirin Other (See Comments)    Upset stomach   Cefaclor Hives and Other (See Comments)   Oxycodone Hives   Sulfa Antibiotics Hives   Lisinopril Cough and Other (See Comments)   Active Problems:   * No active hospital problems. *  Blood pressure 137/86, pulse 99, resp. rate 20, height 5' 3.5" (1.613 m), weight 149 lb 6.4 oz (67.8 kg), SpO2 96 %.  Subjective Objective: Vital signs (most recent): Blood pressure 137/86, pulse 99, resp. rate 20, height 5' 3.5" (1.613 m), weight 149 lb 6.4 oz (67.8 kg), SpO2 96 %.  Cor: NSR, no murmur Pulm: CTA bilaterally and in all fields Abd: soft, non-tender Wound: sternal incision healing well Ext: no edema  CLINICAL DATA:  Status post evacuation of hematoma.   EXAM: CHEST - 2 VIEW   COMPARISON:  October 05, 2020.   FINDINGS: The heart size and mediastinal contours are within normal limits. Both lungs are clear. The visualized skeletal  structures are unremarkable.   IMPRESSION: No active cardiopulmonary disease.     Electronically Signed   By: Marijo Conception M.D.   On: 11/01/2020 13:25    Assessment & Plan  Erin Fields is status post evacuation of a hematoma with Dr. Roxy Manns.  Overall she is doing quite well.  She still has some tenderness over her sternotomy incision and she is still requiring a recliner at night but otherwise that she continues to increase her activity level.  She is still requiring pain medicine so I have not cleared her for driving.  She is to slowly wean herself off of her pain medicine over the next couple weeks.  She plans to be seen by the heart failure clinic and they have been titrating her medications due to some hypotension.  Today, her blood pressure is 137/86 and she does not have any dizziness or lightheadedness.  We removed a stitch over her left ICD pocket that was poking out of her skin and bothering her.  She did experience some relief.  On exam, her incisions were healing well and she did not appear fluid overloaded.  I reviewed the chest x-ray with the patient and there were no immediate concerns.  We did discuss cardiac rehab if it was of interest to her.  She was going to think about this and let our office know.  She stated that she does experience some wheezing from time to time however, on today's exam I did not hear any wheezing.  She also was on a few inhaled medications and she does have a rescue inhaler at home.  Overall, I feel that she is making good progress in her postoperative period.  She does not need to come back and see [place] again for routine appointment and less symptoms arise or there are problems with her incision.  She is to follow-up with her cardiologist  and the heart failure team.  No medication changes were made during this visit.    Elgie Collard 11/01/2020

## 2020-11-01 NOTE — Telephone Encounter (Signed)
FMLA completed and faxed to [employer] at [phone]. Approximate leave 10/02/20-12/25/20. Original handed to patient.

## 2020-11-04 ENCOUNTER — Encounter: Payer: Self-pay | Admitting: Cardiology

## 2020-11-04 ENCOUNTER — Ambulatory Visit (INDEPENDENT_AMBULATORY_CARE_PROVIDER_SITE_OTHER): Payer: No Typology Code available for payment source | Admitting: Cardiology

## 2020-11-04 ENCOUNTER — Other Ambulatory Visit: Payer: Self-pay

## 2020-11-04 VITALS — BP 120/68 | HR 87 | Ht 63.5 in | Wt 148.0 lb

## 2020-11-04 DIAGNOSIS — I10 Essential (primary) hypertension: Secondary | ICD-10-CM

## 2020-11-04 DIAGNOSIS — I2510 Atherosclerotic heart disease of native coronary artery without angina pectoris: Secondary | ICD-10-CM

## 2020-11-04 NOTE — Progress Notes (Signed)
Cardiology Office Note:    Date:  11/04/2020   [name], DOB 23-Apr-1965, MRN [id number]  PCP:  Darreld Mclean, MD  Cardiologist:  Jenne Campus, MD    Referring MD: Darreld Mclean, MD   Chief Complaint  Patient presents with   Post -Op Open Heart Surgery    Not feeling well, tired, SOB.     History of Present Illness:    Erin Fields is a 55 y.o. female with complex past medical history.  She was initially referred to [place] because of EKG being abnormal that was done before elective hip replacement surgery after that echocardiogram has been performed and surprising she was found to have 25% ejection fraction which is severely diminished, that was followed by cardiac catheterization which showed nonobstructive disease she was put on appropriate medication in spite of that her ejection fraction was still diminished, she was referred to our EP team for consideration for ICD.  ICD was implanted and everything was fine she left home however while at home she started having chest pain.  She ended going to the emergency room thinking that she may have discharged from the defibrillator, device has been interrogated no discharge has being identified she was discharged home, however presented back within the next few hours with shortness of breath.  She suffered from perforation of the right ventricle with injury of the artery leading to blood collection in the pleura.  She required open heart surgery with repair of the perforation of the right ventricle as well as evacuation of hematoma from the left pleura.  She is coming today to my office after the surgery.  She is still suffering from some pain.  Also some shortness of breath but overall seems to be recovering quite nicely.  Wound is healing well.  There is no complication apparently of the surgery.  Of course she is upset about this entire scenario, I explained to her what happened I told her this is one of the known  complication of implants aof the devices.  Luckily she seems to be recovering right now.  Past Medical History:  Diagnosis Date   Allergic rhinitis due to animal (cat) (dog) hair and dander 04/27/2020   Allergic rhinitis due to pollen 04/27/2020   Anxiety    Anxiety and depression 01/20/2018   Asthma    Asthma, persistent controlled 10/05/2018   B12 deficiency 11/27/2019   Borderline diabetes 12/17/2015   Cancer ([place]) 2019   right breast cancer   CERVICAL CANCER, HX OF 12/06/2006   Qualifier: Diagnosis of  By: Arnoldo Morale MD, John E    Chronic sinusitis 01/26/2015   Chronic venous insufficiency 09/02/2015   Colon polyps    Congestive heart failure ([place]) 04/29/2020   Controlled type 2 diabetes mellitus without complication, without long-term current use of insulin ([place]) 05/28/2017   Coronary artery disease 25% of RCA, 20% intermediate branch, 40% diagonal branch based on cardiac cath from 2022 07/22/2020   Crohn disease ([place]) 11/29/2016   Depression    Diabetes mellitus without complication ([place])    type 2   Dilated cardiomyopathy ([place]) ejection fraction 25% by echocardiogram from January 2022 04/29/2020   Dyspnea 09/15/2014   Elevated triglycerides with high cholesterol 11/26/2013   Environmental allergies    Eustachian tube dysfunction 12/04/2014   Ex-smoker 11/18/2015   Family history of breast cancer    Genetic testing 07/16/2017   Negative genetic testing on the multi-cancer panel.  The Multi-Gene Panel offered  by [name] includes sequencing and/or deletion duplication testing of the following 83 genes: ALK, APC, ATM, AXIN2,BAP1,  BARD1, BLM, BMPR1A, BRCA1, BRCA2, BRIP1, CASR, CDC73, CDH1, CDK4, CDKN1B, CDKN1C, CDKN2A (p14ARF), CDKN2A (p16INK4a), CEBPA, CHEK2, CTNNA1, DICER1, DIS3L2, EGFR (c.2369C>T, p.Thr790Met variant onl   GERD (gastroesophageal reflux disease)    HEADACHE 12/06/2006   Qualifier: Diagnosis of  By: Arnoldo Morale MD, John E    Hemothorax on left 10/02/2020   History of adenomatous polyp of  colon 11/19/2017   Hyperlipidemia    Hypertension    Hypothyroidism    IDA (iron deficiency anemia) 11/06/2019   Impaired fasting glucose 12/12/2017   Insomnia 08/20/2013   Irritable bowel syndrome 11/29/2006   Qualifier: Diagnosis of  By: Arnoldo Morale MD, John E    Left sided abdominal pain 12/13/2017   Liver hemangioma 08/24/2014   Malignant neoplasm of upper-outer quadrant of right breast in female, estrogen receptor positive ([place]) 03/20/2017   Migraines    Mild persistent asthma, uncomplicated 0/26/3785   Nasal polyp, unspecified 01/10/2018   Oral herpes 04/20/2013   Other allergic rhinitis 09/15/2014   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 10/02/2020   Personal history of radiation therapy 2019   36 radiation tx   PONV (postoperative nausea and vomiting)    scopolamine patch helps   Recurrent infections 10/05/2018   Right upper lobe pulmonary nodule 01/10/2018   Symptomatic varicose veins, right 09/02/2015   Ulcerative colitis ([place])    Varicose veins     Past Surgical History:  Procedure Laterality Date   BREAST BIOPSY Right 02/2017   BREAST BIOPSY Right 11/07/2019   BREAST LUMPECTOMY Right 04/24/2017   BREAST LUMPECTOMY WITH RADIOACTIVE SEED AND SENTINEL LYMPH NODE BIOPSY Right 04/24/2017   Procedure: RIGHT BREAST LUMPECTOMY WITH RADIOACTIVE SEED AND SENTINEL LYMPH NODE BIOPSY;  Surgeon: [name], MD;  Location: [facility];  Service: General;  Laterality: Right;   crapal tunnel relase Left 05/2018   ENDOMETRIAL ABLATION  2010   Had abnormal uterine bleeding (heavy and frequent), performed in [place], menses stopped completely 2 years after   EXPLORATORY LAPAROTOMY     Had exploratory surgery at the age of 45 for abdominal pain with cyst found on her left ovary, not treated surgically   FOOT SURGERY Right 2012   GENERATOR REMOVAL  10/02/2020   Procedure: GENERATOR REMOVAL;  Surgeon: Rexene Alberts, MD;  Location: [facility];  Service: Thoracic;;   HAND SURGERY  Right    ICD IMPLANT N/A 10/01/2020   Procedure: ICD IMPLANT;  Surgeon: Constance Haw, MD;  Location: [facility];  Service: Cardiovascular;  Laterality: N/A;   ICD LEAD REMOVAL N/A 10/02/2020   Procedure: ICD LEAD REMOVAL;  Surgeon: Rexene Alberts, MD;  Location: [facility];  Service: Thoracic;  Laterality: N/A;   MEDIASTERNOTOMY N/A 10/02/2020   Procedure: MEDIAN STERNOTOMY REPAIR PERFORATED VENTRICLE EVACUATION LEFT HEMOTHORAX;  Surgeon: Rexene Alberts, MD;  Location: [facility];  Service: Thoracic;  Laterality: N/A;   RE-EXCISION OF BREAST LUMPECTOMY Right 05/15/2017   Procedure: RE-EXCISION OF BREAST LUMPECTOMY;  Surgeon: [name], MD;  Location: [facility];  Service: General;  Laterality: Right;   RIGHT/LEFT HEART CATH AND CORONARY ANGIOGRAPHY N/A 06/21/2020   Procedure: RIGHT/LEFT HEART CATH AND CORONARY ANGIOGRAPHY;  Surgeon: Nelva Bush, MD;  Location: [facility];  Service: Cardiovascular;  Laterality: N/A;   ROBOTIC ASSISTED SALPINGO OOPHERECTOMY Bilateral 08/29/2019   Procedure: XI ROBOTIC ASSISTED SALPINGO OOPHORECTOMY;  Surgeon:  [name], MD;  [facility];  Service: Gynecology;  Laterality: Bilateral;   Septoplasty with turbinate reduction     TEE WITHOUT CARDIOVERSION N/A 10/02/2020   Procedure: TRANSESOPHAGEAL ECHOCARDIOGRAM (TEE);  Surgeon: Rexene Alberts, MD;  [facility];  Service: Thoracic;  Laterality: N/A;   TONSILLECTOMY  1980   VEIN SURGERY     Removal Right Leg   WISDOM TOOTH EXTRACTION      Current Medications: Current Meds  Medication Sig   acyclovir (ZOVIRAX) 400 MG tablet TAKE 1 TABLET (400 MG TOTAL) BY MOUTH 3 (THREE) TIMES DAILY AS NEEDED. (Patient taking differently: Take 400 mg by mouth 3 (three) times daily as needed (Cold sore).)   Adalimumab 80 MG/0.8ML PNKT Inject 80 mg into the skin every 14 (fourteen) days.   albuterol (VENTOLIN HFA) 108 (90 Base) MCG/ACT inhaler Inhale 2 puffs  into the lungs every 6 (six) hours as needed for shortness of breath.   ascorbic acid (VITAMIN C) 1000 MG tablet Take 1,000 mg by mouth daily.   aspirin EC 81 MG tablet Take 81 mg by mouth daily. Swallow whole.   Azelastine HCl 0.15 % SOLN Place 2 sprays into both nostrils 2 (two) times daily. For 30 days   Biotin 1000 MCG tablet Take 1,000 mcg by mouth daily.   Calcium Carb-Cholecalciferol (CALCIUM+D3) 600-800 MG-UNIT TABS Take 1 tablet by mouth daily.   carvedilol (COREG) 12.5 MG tablet Take 1 tablet (12.5 mg total) by mouth 2 (two) times daily.   cetirizine (ZYRTEC) 10 MG tablet Take 10 mg by mouth daily.   diphenoxylate-atropine (LOMOTIL) 2.5-0.025 MG tablet Take 2 tablets by mouth 4 (four) times daily as needed for diarrhea or loose stools.   ENTRESTO 49-51 MG TAKE 1 TABLET BY MOUTH TWICE A DAY (Patient taking differently: Take 1 tablet by mouth 2 (two) times daily.)   EPINEPHrine 0.3 mg/0.3 mL IJ SOAJ injection Inject 0.3 mg into the muscle as needed for anaphylaxis.   exemestane (AROMASIN) 25 MG tablet TAKE 1 TABLET (25 MG TOTAL) BY MOUTH DAILY AFTER BREAKFAST. (Patient taking differently: Take 25 mg by mouth daily after breakfast.)   Glucosamine-Chondroit-Vit C-Mn (GLUCOSAMINE-CHONDROITIN) CAPS Take 1 capsule by mouth daily. Unknown strenght   HYDROcodone-acetaminophen (NORCO/VICODIN) 5-325 MG tablet Take 1-2 tablets by mouth every 8 (eight) hours as needed. (Patient taking differently: Take 1-2 tablets by mouth every 8 (eight) hours as needed for severe pain.)   Hyoscyamine Sulfate 0.375 MG TBCR Take 0.375 mg by mouth in the morning and at bedtime.   lansoprazole (PREVACID SOLUTAB) 15 MG disintegrating tablet Take 15 mg by mouth 2 (two) times daily before a meal.   levothyroxine (SYNTHROID) 75 MCG tablet Take 1 tablet (75 mcg total) by mouth daily before breakfast.   MAGNESIUM PO Take 250 mg by mouth daily.   meloxicam (MOBIC) 15 MG tablet Take 15 mg by mouth daily as needed for pain (Hip  pain).   mesalamine (LIALDA) 1.2 g EC tablet Take 2.4 g by mouth in the morning and at bedtime.    metFORMIN (GLUCOPHAGE) 500 MG tablet Take 1 tablet (500 mg total) by mouth 2 (two) times daily with a meal.   montelukast (SINGULAIR) 10 MG tablet Take 10 mg by mouth at bedtime.    Omega 3 1000 MG CAPS Take 2,000 mg by mouth every other day.   promethazine (PHENERGAN) 25 MG tablet Take 0.5-1 tablets (12.5-25 mg total) by mouth daily as needed for nausea or vomiting.  rosuvastatin (CRESTOR) 20 MG tablet Take 1 tablet (20 mg total) by mouth daily.   spironolactone (ALDACTONE) 25 MG tablet TAKE 1/2 TABLET BY MOUTH EVERY DAY (Patient taking differently: Take 25 mg by mouth daily.)   SYMBICORT 160-4.5 MCG/ACT inhaler INHALE 2 PUFFS INTO THE LUNGS TWICE A DAY (Patient taking differently: Inhale 2 puffs into the lungs in the morning and at bedtime.)   triamcinolone (NASACORT) 55 MCG/ACT AERO nasal inhaler Place 2 sprays into the nose daily.   Turmeric 500 MG CAPS Take 500 mg by mouth daily.   venlafaxine XR (EFFEXOR-XR) 150 MG 24 hr capsule Take 1 capsule (150 mg total) by mouth daily with breakfast.   zinc gluconate 50 MG tablet Take 50 mg by mouth daily.   zolpidem (AMBIEN) 5 MG tablet TAKE 1 TABLET BY MOUTH EVERY DAY AT BEDTIME AS NEEDED FOR SLEEP (Patient taking differently: Take 5 mg by mouth at bedtime as needed for sleep.)     Allergies:   Losartan potassium, Ace inhibitors, Aspirin, Cefaclor, Oxycodone, Sulfa antibiotics, and Lisinopril   Social History   Socioeconomic History   Marital status: Widowed    Spouse name: Not on file   Number of children: Not on file   Years of education: Not on file   Highest education level: Not on file  Occupational History   Not on file  Tobacco Use   Smoking status: Former    Packs/day: 3.00    Years: 15.00    Pack years: 45.00    Types: Cigarettes    Quit date: 09/14/1993    Years since quitting: 27.1   Smokeless tobacco: Never   Tobacco  comments:    Quit 26 yrs ago  Vaping Use   Vaping Use: Never used  Substance and Sexual Activity   Alcohol use: Yes    Comment: Drinks a couple every 2-3 months   Drug use: No   Sexual activity: Not Currently    Comment: ablation  Other Topics Concern   Not on file  Social History Narrative   Not on file   Social Determinants of Health   Financial Resource Strain: Not on file  Food Insecurity: Not on file  Transportation Needs: Not on file  Physical Activity: Not on file  Stress: Not on file  Social Connections: Not on file     Family History: The patient's family history includes Arthritis (age of onset: 76) in her mother; Brain cancer in her father; Breast cancer in her mother; Colitis in her sister; Crohn's disease in her mother; Diabetes in her maternal grandfather and mother; Heart attack in her maternal grandfather; Heart disease in her maternal grandfather and mother; Hyperlipidemia in her mother; Hypertension in her mother; Leukemia in her maternal grandfather; Leukemia (age of onset: 3) in her cousin; Lung cancer in her father and mother. ROS:   Please see the history of present illness.    All 14 point review of systems negative except as described per history of present illness  EKGs/Labs/Other Studies Reviewed:      Recent Labs: 09/27/2020: TSH 0.31 10/02/2020: ALT 14 10/05/2020: Magnesium 1.5 10/15/2020: BUN 9; Creatinine, Ser 0.55; Hemoglobin 12.0; Platelets 879.0; Potassium 4.7; Sodium 131  Recent Lipid Panel    Component Value Date/Time   CHOL 128 08/10/2020 0936   TRIG 153 (H) 08/10/2020 0936   HDL 37 (L) 08/10/2020 0936   CHOLHDL 3.5 08/10/2020 0936   CHOLHDL 4 06/18/2019 0952   VLDL 31.8 06/18/2019 0952   LDLCALC 65  08/10/2020 0936   LDLDIRECT 101.0 12/02/2018 0955    Physical Exam:    VS:  BP 120/68 (BP Location: Right Arm, Patient Position: Sitting)   Pulse 87   Ht 5' 3.5" (1.613 m)   Wt 148 lb (67.1 kg)   SpO2 94%   BMI 25.81 kg/m     Wt  Readings from Last 3 Encounters:  11/04/20 148 lb (67.1 kg)  11/01/20 149 lb 6.4 oz (67.8 kg)  10/15/20 148 lb (67.1 kg)     GEN:  Well nourished, well developed in no acute distress HEENT: Normal NECK: No JVD; No carotid bruits LYMPHATICS: No lymphadenopathy CARDIAC: RRR, no murmurs, no rubs, no gallops RESPIRATORY:  Clear to auscultation without rales, wheezing or rhonchi  ABDOMEN: Soft, non-tender, non-distended MUSCULOSKELETAL:  No edema; No deformity  SKIN: Warm and dry LOWER EXTREMITIES: no swelling NEUROLOGIC:  Alert and oriented x 3 PSYCHIATRIC:  Normal affect   ASSESSMENT:    1. Coronary artery disease involving native coronary artery of native heart without angina pectoris   2. Primary hypertension    PLAN:    In order of problems listed above:  Cardiomyopathy with ejection fraction is severely reduced.  She is on Entresto as well as beta-blocker which I will continue.  Her Aldactone has been withdrawn because of hypotension.  I will not put her on this medication right now however I will reevaluate her rather quickly within the next month.  Hopefully will be able to have that medication. Essential hypertension blood pressure well controlled today.  We will continue present management. Status post a thoracotomy for repair of the right ventricle perforation as a complication of ICD implantation.  She seems to be recovering after surgery.  Wound is healing well.  She is being seen recently by surgical team. Dyslipidemia: We will continue with present management.  That includes statin.   Medication Adjustments/Labs and Tests Ordered: Current medicines are reviewed at length with the patient today.  Concerns regarding medicines are outlined above.  No orders of the defined types were placed in this encounter.  Medication changes: No orders of the defined types were placed in this encounter.   Signed, Park Liter, MD, [facility] 11/04/2020 9:58 AM    [place]

## 2020-11-04 NOTE — Patient Instructions (Addendum)
Medication Instructions:  Your physician recommends that you continue on your current medications as directed. Please refer to the Current Medication list given to you today.  *If you need a refill on your cardiac medications before your next appointment, please call your pharmacy*   Lab Work: None If you have labs (blood work) drawn today and your tests are completely normal, you will receive your results only by: [place] (if you have MyChart) OR A paper copy in the mail If you have any lab test that is abnormal or we need to change your treatment, we will call you to review the results.   Testing/Procedures: None   Follow-Up: At [facility], you and your health needs are our priority.  As part of our continuing mission to provide you with exceptional heart care, we have created designated Provider Care Teams.  These Care Teams include your primary Cardiologist (physician) and Advanced Practice Providers (APPs -  Physician Assistants and Nurse Practitioners) who all work together to provide you with the care you need, when you need it.  We recommend signing up for the patient portal called "MyChart".  Sign up information is provided on this After Visit Summary.  MyChart is used to connect with patients for Virtual Visits (Telemedicine).  Patients are able to view lab/test results, encounter notes, upcoming appointments, etc.  Non-urgent messages can be sent to your provider as well.   To learn more about what you can do with MyChart, go to [email or web address].    Your next appointment:   1 month(s)  The format for your next appointment:   In Person  Provider:   Jenne Campus, MD   Other Instructions

## 2020-11-05 ENCOUNTER — Encounter: Payer: Self-pay | Admitting: Family Medicine

## 2020-11-05 MED ORDER — METFORMIN HCL 500 MG PO TABS: 500.0000 mg | ORAL_TABLET | Freq: Two times a day (BID) | ORAL | 3 refills | Status: DC

## 2020-11-06 ENCOUNTER — Other Ambulatory Visit: Payer: Self-pay | Admitting: Family Medicine

## 2020-11-06 DIAGNOSIS — F430 Acute stress reaction: Secondary | ICD-10-CM

## 2020-11-06 DIAGNOSIS — E034 Atrophy of thyroid (acquired): Secondary | ICD-10-CM

## 2020-11-07 NOTE — Progress Notes (Addendum)
[place] [facility] [place], [place], [place] [address] [phone]  Date:  11/11/2020   Name:  Erin Fields   DOB:  07-Jan-1966   MRN:  [id number]  PCP:  Darreld Mclean, MD    Chief Complaint: Medical Management of Chronic Issues (4 week f/u ) and wound care (Near the tummy )   History of Present Illness:  Erin Fields is a 55 y.o. very pleasant female patient who presents with the following:  Patient seen today for short-term follow-up visit I last saw her in June-  history significant for right-sided breast cancer, type 2 diabetes, nonobstructive coronary artery disease, hypertension, hyperlipidemia, end-stage hip arthritis and recent heart surgery as below  In June, she was preparing to have an ICD implantation to treat dilated cardiomyopathy with a EF of 25% noted on preoperative work-up.  Her ICD was placed on 6/17.  She had complications from her ICD implantation resulting in perforation of the right ventricle.  She was readmitted on 6/18 and underwent emergency surgery per Dr. Roxy Manns in which her perforated right ventricle and laceration of the left fifth intercostal artery were repaired.    She has done well in the postop period and was seen by her primary cardiologist, Dr. Raliegh Ip on 7/21  Due to her significantly impaired EF, she will be seeing Dr. Aundra Dubin with advanced heart failure clinic tomorrow  She was given flexeril recently for leg cramps by an urgent care?  I do not see this office visit in her chart In any case, Erin Fields has noted some leg cramps for the last couple of weeks.  We can certainly recheck her electrolytes and magnesium today  Overall Maliah feels that she is doing well.  She is using her spirometer and getting her strength back.  She does have tenderness along her sternotomy scar as well as the drain scars on her upper abdomen.  In addition, she notes some stiffness and discomfort in her left  shoulder  She also continues to have a productive cough, and sometimes will bring up some green mucus.  No fevers We did a course of doxycycline about 6 weeks ago which she tolerated well   Lab Results  Component Value Date   HGBA1C 6.3 09/27/2020   We will plan to give Prevnar 20 when she completes treatment for current possible bronchitis COVID series complete   She is accompanied today by her son  Patient Active Problem List   Diagnosis Date Noted   Hemothorax on left 10/02/2020   S/P evacuation of hematoma 10/02/2020   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 10/02/2020   Hypovolemic shock ([place]) 10/02/2020   Anxiety    Coronary artery disease 25% of RCA, 20% intermediate branch, 40% diagonal branch based on cardiac cath from 2022 07/22/2020   Dilated cardiomyopathy ([place]) ejection fraction 25% by echocardiogram from January 2022 04/29/2020   Congestive heart failure ([place]) 04/29/2020   Allergic rhinitis due to animal (cat) (dog) hair and dander 04/27/2020   Allergic rhinitis due to pollen 04/27/2020   Mild persistent asthma, uncomplicated 10/93/2355   PONV (postoperative nausea and vomiting)    Migraines    GERD (gastroesophageal reflux disease)    Environmental allergies    Diabetes mellitus without complication ([place])    Depression    Colon polyps    B12 deficiency 11/27/2019   IDA (iron deficiency anemia) 11/06/2019   Asthma, persistent controlled 10/05/2018  Recurrent infections 10/05/2018   Anxiety and depression 01/20/2018   Right upper lobe pulmonary nodule 01/10/2018   Nasal polyp, unspecified 01/10/2018   Left sided abdominal pain 12/13/2017   Impaired fasting glucose 12/12/2017   History of adenomatous polyp of colon 11/19/2017   Genetic testing 07/16/2017   Family history of breast cancer    Controlled type 2 diabetes mellitus without complication, without long-term current use of insulin ([place]) 05/28/2017   Personal history of radiation therapy  2019   Cancer ([place]) 2019   Malignant neoplasm of upper-outer quadrant of right breast in female, estrogen receptor positive ([place]) 03/20/2017   Crohn disease ([place]) 11/29/2016   Borderline diabetes 12/17/2015   Ex-smoker 11/18/2015   Chronic venous insufficiency 09/02/2015   Symptomatic varicose veins, right 09/02/2015   Chronic sinusitis 01/26/2015   Eustachian tube dysfunction 12/04/2014   Other allergic rhinitis 09/15/2014   Dyspnea 09/15/2014   Liver hemangioma 08/24/2014   Elevated triglycerides with high cholesterol 11/26/2013   Insomnia 08/20/2013   Hypertension 08/20/2013   Ulcerative colitis ([place]) 04/20/2013   Oral herpes 04/20/2013   Hyperlipidemia 12/06/2006   HEADACHE 12/06/2006   CERVICAL CANCER, HX OF 12/06/2006   Hypothyroidism 11/29/2006   Asthma 11/29/2006   IRRITABLE BOWEL SYNDROME 11/29/2006    Past Medical History:  Diagnosis Date   Allergic rhinitis due to animal (cat) (dog) hair and dander 04/27/2020   Allergic rhinitis due to pollen 04/27/2020   Anxiety    Anxiety and depression 01/20/2018   Asthma    Asthma, persistent controlled 10/05/2018   B12 deficiency 11/27/2019   Borderline diabetes 12/17/2015   Cancer ([place]) 2019   right breast cancer   CERVICAL CANCER, HX OF 12/06/2006   Qualifier: Diagnosis of  By: Arnoldo Morale MD, John E    Chronic sinusitis 01/26/2015   Chronic venous insufficiency 09/02/2015   Colon polyps    Congestive heart failure ([place]) 04/29/2020   Controlled type 2 diabetes mellitus without complication, without long-term current use of insulin ([place]) 05/28/2017   Coronary artery disease 25% of RCA, 20% intermediate branch, 40% diagonal branch based on cardiac cath from 2022 07/22/2020   Crohn disease ([place]) 11/29/2016   Depression    Diabetes mellitus without complication ([place])    type 2   Dilated cardiomyopathy ([place]) ejection fraction 25% by echocardiogram from January 2022 04/29/2020   Dyspnea 09/15/2014   Elevated triglycerides with high  cholesterol 11/26/2013   Environmental allergies    Eustachian tube dysfunction 12/04/2014   Ex-smoker 11/18/2015   Family history of breast cancer    Genetic testing 07/16/2017   Negative genetic testing on the multi-cancer panel.  The Multi-Gene Panel offered by [name] includes sequencing and/or deletion duplication testing of the following 83 genes: ALK, APC, ATM, AXIN2,BAP1,  BARD1, BLM, BMPR1A, BRCA1, BRCA2, BRIP1, CASR, CDC73, CDH1, CDK4, CDKN1B, CDKN1C, CDKN2A (p14ARF), CDKN2A (p16INK4a), CEBPA, CHEK2, CTNNA1, DICER1, DIS3L2, EGFR (c.2369C>T, p.Thr790Met variant onl   GERD (gastroesophageal reflux disease)    HEADACHE 12/06/2006   Qualifier: Diagnosis of  By: Arnoldo Morale MD, John E    Hemothorax on left 10/02/2020   History of adenomatous polyp of colon 11/19/2017   Hyperlipidemia    Hypertension    Hypothyroidism    IDA (iron deficiency anemia) 11/06/2019   Impaired fasting glucose 12/12/2017   Insomnia 08/20/2013   Irritable bowel syndrome 11/29/2006   Qualifier: Diagnosis of  By: Arnoldo Morale MD, John E    Left sided abdominal pain 12/13/2017   Liver hemangioma 08/24/2014  Malignant neoplasm of upper-outer quadrant of right breast in female, estrogen receptor positive ([place]) 03/20/2017   Migraines    Mild persistent asthma, uncomplicated 5/91/6384   Nasal polyp, unspecified 01/10/2018   Oral herpes 04/20/2013   Other allergic rhinitis 09/15/2014   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 10/02/2020   Personal history of radiation therapy 2019   36 radiation tx   PONV (postoperative nausea and vomiting)    scopolamine patch helps   Recurrent infections 10/05/2018   Right upper lobe pulmonary nodule 01/10/2018   Symptomatic varicose veins, right 09/02/2015   Ulcerative colitis ([place])    Varicose veins     Past Surgical History:  Procedure Laterality Date   BREAST BIOPSY Right 02/2017   BREAST BIOPSY Right 11/07/2019   BREAST LUMPECTOMY Right 04/24/2017   BREAST LUMPECTOMY WITH RADIOACTIVE  SEED AND SENTINEL LYMPH NODE BIOPSY Right 04/24/2017   Procedure: RIGHT BREAST LUMPECTOMY WITH RADIOACTIVE SEED AND SENTINEL LYMPH NODE BIOPSY;  Surgeon: [name], MD;  Location: [facility];  Service: General;  Laterality: Right;   crapal tunnel relase Left 05/2018   ENDOMETRIAL ABLATION  2010   Had abnormal uterine bleeding (heavy and frequent), performed in [place], menses stopped completely 2 years after   EXPLORATORY LAPAROTOMY     Had exploratory surgery at the age of 20 for abdominal pain with cyst found on her left ovary, not treated surgically   FOOT SURGERY Right 2012   GENERATOR REMOVAL  10/02/2020   Procedure: GENERATOR REMOVAL;  Surgeon: Rexene Alberts, MD;  Location: [facility];  Service: Thoracic;;   HAND SURGERY Right    ICD IMPLANT N/A 10/01/2020   Procedure: ICD IMPLANT;  Surgeon: Constance Haw, MD;  Location: [facility];  Service: Cardiovascular;  Laterality: N/A;   ICD LEAD REMOVAL N/A 10/02/2020   Procedure: ICD LEAD REMOVAL;  Surgeon: Rexene Alberts, MD;  Location: [facility];  Service: Thoracic;  Laterality: N/A;   MEDIASTERNOTOMY N/A 10/02/2020   Procedure: MEDIAN STERNOTOMY REPAIR PERFORATED VENTRICLE EVACUATION LEFT HEMOTHORAX;  Surgeon: Rexene Alberts, MD;  Location: [facility];  Service: Thoracic;  Laterality: N/A;   RE-EXCISION OF BREAST LUMPECTOMY Right 05/15/2017   Procedure: RE-EXCISION OF BREAST LUMPECTOMY;  Surgeon: [name], MD;  Location: [facility];  Service: General;  Laterality: Right;   RIGHT/LEFT HEART CATH AND CORONARY ANGIOGRAPHY N/A 06/21/2020   Procedure: RIGHT/LEFT HEART CATH AND CORONARY ANGIOGRAPHY;  Surgeon: Nelva Bush, MD;  Location: [facility];  Service: Cardiovascular;  Laterality: N/A;   ROBOTIC ASSISTED SALPINGO OOPHERECTOMY Bilateral 08/29/2019   Procedure: XI ROBOTIC ASSISTED SALPINGO OOPHORECTOMY;  Surgeon: [name], MD;  Location[facility];  Service:  Gynecology;  Laterality: Bilateral;   Septoplasty with turbinate reduction     TEE WITHOUT CARDIOVERSION N/A 10/02/2020   Procedure: TRANSESOPHAGEAL ECHOCARDIOGRAM (TEE);  Surgeon: Rexene Alberts, MD;  Location: [facility];  Service: Thoracic;  Laterality: N/A;   TONSILLECTOMY  1980   VEIN SURGERY     Removal Right Leg   WISDOM TOOTH EXTRACTION      Social History   Tobacco Use   Smoking status: Former    Packs/day: 3.00    Years: 15.00    Pack years: 45.00    Types: Cigarettes    Quit date: 09/14/1993    Years since quitting: 27.1   Smokeless tobacco: Never   Tobacco comments:    Quit 26 yrs ago  Vaping Use  Vaping Use: Never used  Substance Use Topics   Alcohol use: Yes    Comment: Drinks a couple every 2-3 months   Drug use: No    Family History  Problem Relation Age of Onset   Arthritis Mother 28       Deceased   Breast cancer Mother    Lung cancer Mother    Hyperlipidemia Mother    Heart disease Mother    Hypertension Mother    Diabetes Mother    Crohn's disease Mother    Diabetes Maternal Grandfather    Heart disease Maternal Grandfather    Heart attack Maternal Grandfather    Leukemia Maternal Grandfather    Colitis Sister    Leukemia Cousin 3       mat cousin   Lung cancer Father    Brain cancer Father     Allergies  Allergen Reactions   Losartan Potassium Shortness Of Breath and Other (See Comments)   Ace Inhibitors Cough   Aspirin Other (See Comments)    Upset stomach   Cefaclor Hives and Other (See Comments)   Oxycodone Hives   Sulfa Antibiotics Hives   Lisinopril Cough and Other (See Comments)    Medication list has been reviewed and updated.  Current Outpatient Medications on File Prior to Visit  Medication Sig Dispense Refill   acyclovir (ZOVIRAX) 400 MG tablet TAKE 1 TABLET (400 MG TOTAL) BY MOUTH 3 (THREE) TIMES DAILY AS NEEDED. (Patient taking differently: Take 400 mg by mouth 3 (three) times daily as needed (Cold sore).) 270 tablet 1    Adalimumab 80 MG/0.8ML PNKT Inject 80 mg into the skin every 14 (fourteen) days.     albuterol (VENTOLIN HFA) 108 (90 Base) MCG/ACT inhaler Inhale 2 puffs into the lungs every 6 (six) hours as needed for shortness of breath.     ascorbic acid (VITAMIN C) 1000 MG tablet Take 1,000 mg by mouth daily.     aspirin EC 81 MG tablet Take 81 mg by mouth daily. Swallow whole.     Azelastine HCl 0.15 % SOLN Place 2 sprays into both nostrils 2 (two) times daily. For 30 days     Biotin 1000 MCG tablet Take 1,000 mcg by mouth daily.     Calcium Carb-Cholecalciferol (CALCIUM+D3) 600-800 MG-UNIT TABS Take 1 tablet by mouth daily.     carvedilol (COREG) 12.5 MG tablet Take 1 tablet (12.5 mg total) by mouth 2 (two) times daily. 60 tablet 2   cetirizine (ZYRTEC) 10 MG tablet Take 10 mg by mouth daily.     diphenoxylate-atropine (LOMOTIL) 2.5-0.025 MG tablet Take 2 tablets by mouth 4 (four) times daily as needed for diarrhea or loose stools.     ENTRESTO 49-51 MG TAKE 1 TABLET BY MOUTH TWICE A DAY (Patient taking differently: Take 1 tablet by mouth 2 (two) times daily.) 180 tablet 3   EPINEPHrine 0.3 mg/0.3 mL IJ SOAJ injection Inject 0.3 mg into the muscle as needed for anaphylaxis.     exemestane (AROMASIN) 25 MG tablet TAKE 1 TABLET (25 MG TOTAL) BY MOUTH DAILY AFTER BREAKFAST. (Patient taking differently: Take 25 mg by mouth daily after breakfast.) 90 tablet 1   Glucosamine-Chondroit-Vit C-Mn (GLUCOSAMINE-CHONDROITIN) CAPS Take 1 capsule by mouth daily. Unknown strenght     HYDROcodone-acetaminophen (NORCO/VICODIN) 5-325 MG tablet Take 1-2 tablets by mouth every 8 (eight) hours as needed. (Patient taking differently: Take 1-2 tablets by mouth every 8 (eight) hours as needed for severe pain.) 20 tablet 0  Hyoscyamine Sulfate 0.375 MG TBCR Take 0.375 mg by mouth in the morning and at bedtime.     lansoprazole (PREVACID SOLUTAB) 15 MG disintegrating tablet Take 15 mg by mouth 2 (two) times daily before a meal.      levothyroxine (SYNTHROID) 75 MCG tablet Take 1 tablet (75 mcg total) by mouth daily before breakfast. 30 tablet 6   MAGNESIUM PO Take 250 mg by mouth daily.     meloxicam (MOBIC) 15 MG tablet Take 15 mg by mouth daily as needed for pain (Hip pain).     Menthol, Topical Analgesic, (BIOFREEZE COLORLESS) 4 % GEL Apply 1 application topically daily as needed (Hip pain).     mesalamine (LIALDA) 1.2 g EC tablet Take 2.4 g by mouth in the morning and at bedtime.      metFORMIN (GLUCOPHAGE) 500 MG tablet Take 1 tablet (500 mg total) by mouth 2 (two) times daily with a meal. 180 tablet 3   montelukast (SINGULAIR) 10 MG tablet Take 10 mg by mouth at bedtime.      Omega 3 1000 MG CAPS Take 2,000 mg by mouth every other day.     promethazine (PHENERGAN) 25 MG tablet Take 0.5-1 tablets (12.5-25 mg total) by mouth daily as needed for nausea or vomiting. 30 tablet 5   rosuvastatin (CRESTOR) 20 MG tablet Take 1 tablet (20 mg total) by mouth daily. 90 tablet 3   spironolactone (ALDACTONE) 25 MG tablet TAKE 1/2 TABLET BY MOUTH EVERY DAY (Patient taking differently: Take 25 mg by mouth daily.) 45 tablet 2   SYMBICORT 160-4.5 MCG/ACT inhaler INHALE 2 PUFFS INTO THE LUNGS TWICE A DAY (Patient taking differently: Inhale 2 puffs into the lungs in the morning and at bedtime.) 30.6 each 1   triamcinolone (NASACORT) 55 MCG/ACT AERO nasal inhaler Place 2 sprays into the nose daily.     Turmeric 500 MG CAPS Take 500 mg by mouth daily.     venlafaxine XR (EFFEXOR-XR) 150 MG 24 hr capsule TAKE 1 CAPSULE BY MOUTH DAILY WITH BREAKFAST. 90 capsule 1   zinc gluconate 50 MG tablet Take 50 mg by mouth daily.     zolpidem (AMBIEN) 5 MG tablet TAKE 1 TABLET BY MOUTH EVERY DAY AT BEDTIME AS NEEDED FOR SLEEP (Patient taking differently: Take 5 mg by mouth at bedtime as needed for sleep.) 30 tablet 2   No current facility-administered medications on file prior to visit.    Review of Systems:  As per HPI- otherwise  negative.   Physical Examination: Vitals:   11/11/20 1026  BP: 112/80  Pulse: 86  Temp: 97.9 F (36.6 C)  SpO2: 98%   Vitals:   11/11/20 1026  Weight: 149 lb 3.2 oz (67.7 kg)  Height: 5' 3.5" (1.613 m)   Body mass index is 26.02 kg/m. Ideal Body Weight: Weight in (lb) to have BMI = 25: 143.1  GEN: no acute distress.  Looks quite well considering, normal weight HEENT: Atraumatic, Normocephalic.  Ears and Nose: No external deformity. CV: RRR, No M/G/R. No JVD. No thrill. No extra heart sounds. PULM: CTA B, no wheezes, crackles, rhonchi. No retractions. No resp. distress. No accessory muscle use. ABD: S, NT, ND, +BS. No rebound. No HSM. EXTR: No c/c/e PSYCH: Normally interactive. Conversant.  She has some tenderness over her sternotomy scar, especially the superior aspect.  However, does not appear to be infected She has 3 healing wounds from what sounds like drain placement over her upper abdomen.  The left  most is a bit erythematous and may be developing a mild infection There is bruising over her left upper arm, range of motion is restricted about 30%   Assessment and Plan: Bronchitis - Plan: doxycycline (VIBRAMYCIN) 100 MG capsule  Leg cramps - Plan: CBC, Comprehensive metabolic panel, Magnesium  Essential hypertension - Plan: CBC, Comprehensive metabolic panel  Stiffness of left shoulder joint  Dilated cardiomyopathy (HCC) ejection fraction 25% by echocardiogram from January 2022 Patient is seen today following a complex recent hospitalization She had an ICD placed and had an unfortunate but known complication with right ventricle perforation.  This was repaired by Dr. Roxy Manns and she is doing quite well overall.    She does continue to notice leg cramps, labs are pending as above I suspect she has some tendinitis of her left shoulder,at risk for frozen shoulder.  I advised her that physical therapy will need to wait until she is more improved, but did encourage range  of motion exercises for now  She has noted a cough productive of green mucus, and also has some possible mild cellulitis starting at her abdominal incisions.  We will treat her with 10 days of doxycycline  I asked her to see me in 3 to 4 months, sooner if I can do anything else to help This visit occurred during the SARS-CoV-2 public health emergency.  Safety protocols were in place, including screening questions prior to the visit, additional usage of staff PPE, and extensive cleaning of exam room while observing appropriate contact time as indicated for disinfecting solutions.   Signed Lamar Blinks, MD  Received labs as below, message to pt  Results for orders placed or performed in visit on 11/11/20  CBC  Result Value Ref Range   WBC 5.6 4.0 - 10.5 K/uL   RBC 4.49 3.87 - 5.11 Mil/uL   Platelets 445.0 (H) 150.0 - 400.0 K/uL   Hemoglobin 13.4 12.0 - 15.0 g/dL   HCT 41.0 36.0 - 46.0 %   MCV 91.4 78.0 - 100.0 fl   MCHC 32.8 30.0 - 36.0 g/dL   RDW 13.4 11.5 - 15.5 %  Comprehensive metabolic panel  Result Value Ref Range   Sodium 138 135 - 145 mEq/L   Potassium 4.2 3.5 - 5.1 mEq/L   Chloride 102 96 - 112 mEq/L   CO2 28 19 - 32 mEq/L   Glucose, Bld 103 (H) 70 - 99 mg/dL   BUN 7 6 - 23 mg/dL   Creatinine, Ser 0.58 0.40 - 1.20 mg/dL   Total Bilirubin 0.9 0.2 - 1.2 mg/dL   Alkaline Phosphatase 76 39 - 117 U/L   AST 13 0 - 37 U/L   ALT 16 0 - 35 U/L   Total Protein 6.6 6.0 - 8.3 g/dL   Albumin 4.5 3.5 - 5.2 g/dL   GFR 100.36 >60.00 mL/min   Calcium 10.0 8.4 - 10.5 mg/dL  Magnesium  Result Value Ref Range   Magnesium 1.6 1.5 - 2.5 mg/dL

## 2020-11-11 ENCOUNTER — Ambulatory Visit (INDEPENDENT_AMBULATORY_CARE_PROVIDER_SITE_OTHER): Payer: No Typology Code available for payment source | Admitting: Family Medicine

## 2020-11-11 ENCOUNTER — Encounter: Payer: Self-pay | Admitting: Family Medicine

## 2020-11-11 ENCOUNTER — Other Ambulatory Visit: Payer: Self-pay

## 2020-11-11 VITALS — BP 112/80 | HR 86 | Temp 97.9°F | Ht 63.5 in | Wt 149.2 lb

## 2020-11-11 DIAGNOSIS — I10 Essential (primary) hypertension: Secondary | ICD-10-CM | POA: Diagnosis not present

## 2020-11-11 DIAGNOSIS — I420 Dilated cardiomyopathy: Secondary | ICD-10-CM

## 2020-11-11 DIAGNOSIS — R252 Cramp and spasm: Secondary | ICD-10-CM | POA: Diagnosis not present

## 2020-11-11 DIAGNOSIS — J40 Bronchitis, not specified as acute or chronic: Secondary | ICD-10-CM

## 2020-11-11 DIAGNOSIS — M25612 Stiffness of left shoulder, not elsewhere classified: Secondary | ICD-10-CM | POA: Diagnosis not present

## 2020-11-11 LAB — COMPREHENSIVE METABOLIC PANEL
ALT: 16 U/L (ref 0–35)
AST: 13 U/L (ref 0–37)
Albumin: 4.5 g/dL (ref 3.5–5.2)
Alkaline Phosphatase: 76 U/L (ref 39–117)
BUN: 7 mg/dL (ref 6–23)
CO2: 28 mEq/L (ref 19–32)
Calcium: 10 mg/dL (ref 8.4–10.5)
Chloride: 102 mEq/L (ref 96–112)
Creatinine, Ser: 0.58 mg/dL (ref 0.40–1.20)
GFR: 100.36 mL/min (ref >60.00)
Glucose, Bld: 103 mg/dL — ABNORMAL HIGH (ref 70–99)
Potassium: 4.2 mEq/L (ref 3.5–5.1)
Sodium: 138 mEq/L (ref 135–145)
Total Bilirubin: 0.9 mg/dL (ref 0.2–1.2)
Total Protein: 6.6 g/dL (ref 6.0–8.3)

## 2020-11-11 LAB — CBC
HCT: 41 % (ref 36.0–46.0)
Hemoglobin: 13.4 g/dL (ref 12.0–15.0)
MCHC: 32.8 g/dL (ref 30.0–36.0)
MCV: 91.4 fl (ref 78.0–100.0)
Platelets: 445 10*3/uL — ABNORMAL HIGH (ref 150.0–400.0)
RBC: 4.49 Mil/uL (ref 3.87–5.11)
RDW: 13.4 % (ref 11.5–15.5)
WBC: 5.6 10*3/uL (ref 4.0–10.5)

## 2020-11-11 LAB — MAGNESIUM: Magnesium: 1.6 mg/dL (ref 1.5–2.5)

## 2020-11-11 MED ORDER — DOXYCYCLINE HYCLATE 100 MG PO CAPS
100.0000 mg | ORAL_CAPSULE | Freq: Two times a day (BID) | ORAL | 0 refills | Status: DC
Start: 2020-11-11 — End: 2020-12-13

## 2020-11-11 NOTE — Patient Instructions (Addendum)
It was good to see you today- I am so glad you are ok!  We will use a course of doxycycline for your chest congestion and possible mild infection around your surgical site I will be in touch with your labs- we will check on how you are recovering and look for any cause of your leg cramps Work on gentle but firm range of motion exercises for your left shoulder   You are due a pneumonia vaccine- can be given when you are well

## 2020-11-12 ENCOUNTER — Ambulatory Visit: Payer: No Typology Code available for payment source | Admitting: Hematology

## 2020-11-12 ENCOUNTER — Encounter (HOSPITAL_COMMUNITY): Payer: Self-pay | Admitting: Cardiology

## 2020-11-12 ENCOUNTER — Ambulatory Visit (HOSPITAL_COMMUNITY)
Admission: RE | Admit: 2020-11-12 | Discharge: 2020-11-12 | Disposition: A | Discharge status: Home / Self Care | Payer: No Typology Code available for payment source | Source: Ambulatory Visit | Attending: Cardiology | Admitting: Cardiology

## 2020-11-12 VITALS — BP 108/60 | HR 78 | Wt 148.8 lb

## 2020-11-12 DIAGNOSIS — Z923 Personal history of irradiation: Secondary | ICD-10-CM | POA: Insufficient documentation

## 2020-11-12 DIAGNOSIS — I110 Hypertensive heart disease with heart failure: Secondary | ICD-10-CM | POA: Diagnosis not present

## 2020-11-12 DIAGNOSIS — Z7982 Long term (current) use of aspirin: Secondary | ICD-10-CM | POA: Diagnosis not present

## 2020-11-12 DIAGNOSIS — Z8616 Personal history of COVID-19: Secondary | ICD-10-CM | POA: Insufficient documentation

## 2020-11-12 DIAGNOSIS — I2510 Atherosclerotic heart disease of native coronary artery without angina pectoris: Secondary | ICD-10-CM | POA: Diagnosis not present

## 2020-11-12 DIAGNOSIS — I5022 Chronic systolic (congestive) heart failure: Secondary | ICD-10-CM | POA: Diagnosis not present

## 2020-11-12 DIAGNOSIS — Z7984 Long term (current) use of oral hypoglycemic drugs: Secondary | ICD-10-CM | POA: Insufficient documentation

## 2020-11-12 DIAGNOSIS — Z853 Personal history of malignant neoplasm of breast: Secondary | ICD-10-CM | POA: Diagnosis not present

## 2020-11-12 DIAGNOSIS — Z95810 Presence of automatic (implantable) cardiac defibrillator: Secondary | ICD-10-CM | POA: Insufficient documentation

## 2020-11-12 DIAGNOSIS — Z79899 Other long term (current) drug therapy: Secondary | ICD-10-CM | POA: Diagnosis not present

## 2020-11-12 DIAGNOSIS — Z8249 Family history of ischemic heart disease and other diseases of the circulatory system: Secondary | ICD-10-CM | POA: Diagnosis not present

## 2020-11-12 DIAGNOSIS — Z7951 Long term (current) use of inhaled steroids: Secondary | ICD-10-CM | POA: Insufficient documentation

## 2020-11-12 DIAGNOSIS — I428 Other cardiomyopathies: Secondary | ICD-10-CM | POA: Insufficient documentation

## 2020-11-12 MED ORDER — SPIRONOLACTONE 25 MG PO TABS: 12.5000 mg | ORAL_TABLET | Freq: Every day | ORAL | 3 refills | Status: DC

## 2020-11-12 NOTE — Patient Instructions (Signed)
A EKG was performed today. No labs were performed today.  RESTART Spirolactone 12.85m (half a tablet) by mouth daily  Your physician has requested that you have a cardiac MRI. Cardiac MRI uses a computer to create images of your heart as its beating, producing both still and moving pictures of your heart and major blood vessels. This has to be approved through [employer] to scheduling. Once approved we will contact you to schedule appointment.   Your physician recommends that you schedule a follow-up appointment in: 10 days for a lab only appointment. In 3 weeks with our clinic pharmacist. And in 6 weeks to see Dr. MAundra Dubin   If you have any questions or concerns before your next appointment please send [place] message through [facility] call our office at [phone]    TO LEAVE A MESSAGE FOR THE NURSE SELECT OPTION 2, PLEASE LEAVE A MESSAGE INCLUDING: YOUR NAME DATE OF BIRTH CALL BACK NUMBER REASON FOR CALL**this is important as we prioritize the call backs  YOU WILL RECEIVE A CALL BACK THE SAME DAY AS LONG AS YOU CALL BEFORE 4:00 PM   milAt the [facility], you and your health needs are our priority. As part of our continuing mission to provide you with exceptional heart care, we have created designated Provider Care Teams. These Care Teams include your primary Cardiologist (physician) and Advanced Practice Providers (APPs- Physician Assistants and Nurse Practitioners) who all work together to provide you with the care you need, when you need it.   You may see any of the following providers on your designated Care Team at your next follow up: Dr DGlori BickersDr DLoralie ChampagneDr BPatrice Paradise NP BLyda Jester PUtahJGinnie SmartLAudry Riles PharmD   Please be sure to bring in all your medications bottles to every appointment.

## 2020-11-12 NOTE — Progress Notes (Signed)
ReDS Vest / Clip - 11/12/20 1100       ReDS Vest / Clip   Station Marker A    Ruler Value 30    ReDS Value Range Low volume    ReDS Actual Value 32

## 2020-11-14 NOTE — Progress Notes (Signed)
PCP: Dr. Lorelei Pont Cardiology: Dr. Agustin Cree  55 y.o. with history of ulcerative colitis, HTN, and breast cancer was referred by Dr. Agustin Cree for evaluation of CHF.   Patient's breast cancer was treated with surgery and radiation, no chemo.  Patient reports exertional dyspnea since 2019.  Patient has right hip OA, needs THR.  Echo was done in 1/22 as part of pre-op workup, this showed low EF 25%.  She then had RHC/LHC in 3/22 with mild nonobstructive CAD and low cardiac index 2.1.  Echo in 4/22 showed EF still 20-25%.  She was referred for ICD.  St Jude ICD was placed in 1/61 but complicated by RV perforation with hemothorax and hemorrhagic shock.  ICD was removed and RV was surgically repaired with sternotomy.  Patient's mother had CHF in her 09U, uncertain etiology.   Patient generally is doing well at this time. She is mildly short of breath walking longer distances such as around the grocery store.  She is short of breath walking up stairs.  No problems walking around the house.  No lightheadedness.  Rare ETOH use.  No chest pain.  No orthopnea/PND.   Labs (7/22): K 4.2, creatinine 0.58  REDS clip 32%  ECG (personally reviewed): NSR, anterolateral ischemia.   PMH: 1.  Cervical cancer 2.  B12 deficiency 3.  Asthma 4.  Type 2 diabetes 5.  Ulcerative colitis 6.  HTN 7.  Hypothyroidism 8.  Irritable bowel syndrome 9.  Breast cancer: Right-sided, surgery and radiation but no chemotherapy.  10.  Chronic systolic CHF: Nonischemic cardiomyopathy.  - Echo (1/22): EF 25% - LHC/RHC (3/22): Mild nonobstructive CAD; mean RA 3, PA 24/11, CI 2.1.  - Echo (4/22): EF 20-25%, mild LV dilation, normal RV.  - Echo (6/22): EF 30-35%  - St Jude ICD placed 0/45, complicated by RV perforation requiring sternotomy due to hemothorax/hemorrhagic shock, removal of ICD and surgical repair of RV perforation.  11.  Right hip OA: Needs THR.  12.  COVID-19 12/21  FH: Grandfather with MI, mother with CHF in her  18s.    SH: Lives in [place], nonsmoker, rare EtoH.   ROS: All systems reviewed and negative except as per HPI.  Current Outpatient Medications  Medication Sig Dispense Refill   acyclovir (ZOVIRAX) 400 MG tablet TAKE 1 TABLET (400 MG TOTAL) BY MOUTH 3 (THREE) TIMES DAILY AS NEEDED. (Patient taking differently: Take 400 mg by mouth 3 (three) times daily as needed (Cold sore).) 270 tablet 1   Adalimumab 80 MG/0.8ML PNKT Inject 80 mg into the skin every 14 (fourteen) days.     albuterol (VENTOLIN HFA) 108 (90 Base) MCG/ACT inhaler Inhale 2 puffs into the lungs every 6 (six) hours as needed for shortness of breath.     ascorbic acid (VITAMIN C) 1000 MG tablet Take 1,000 mg by mouth daily.     aspirin EC 81 MG tablet Take 81 mg by mouth daily. Swallow whole.     Azelastine HCl 0.15 % SOLN Place 2 sprays into both nostrils 2 (two) times daily. For 30 days     Biotin 1000 MCG tablet Take 1,000 mcg by mouth daily.     Calcium Carb-Cholecalciferol (CALCIUM+D3) 600-800 MG-UNIT TABS Take 1 tablet by mouth daily.     carvedilol (COREG) 12.5 MG tablet Take 1 tablet (12.5 mg total) by mouth 2 (two) times daily. 60 tablet 2   cetirizine (ZYRTEC) 10 MG tablet Take 10 mg by mouth daily.     diphenoxylate-atropine (LOMOTIL) 2.5-0.025 MG  tablet Take 2 tablets by mouth 4 (four) times daily as needed for diarrhea or loose stools.     doxycycline (VIBRAMYCIN) 100 MG capsule Take 1 capsule (100 mg total) by mouth 2 (two) times daily. 20 capsule 0   ENTRESTO 49-51 MG TAKE 1 TABLET BY MOUTH TWICE A DAY (Patient taking differently: Take 1 tablet by mouth 2 (two) times daily.) 180 tablet 3   EPINEPHrine 0.3 mg/0.3 mL IJ SOAJ injection Inject 0.3 mg into the muscle as needed for anaphylaxis.     exemestane (AROMASIN) 25 MG tablet TAKE 1 TABLET (25 MG TOTAL) BY MOUTH DAILY AFTER BREAKFAST. (Patient taking differently: Take 25 mg by mouth daily after breakfast.) 90 tablet 1   Glucosamine-Chondroit-Vit C-Mn  (GLUCOSAMINE-CHONDROITIN) CAPS Take 1 capsule by mouth daily. Unknown strenght     Hyoscyamine Sulfate 0.375 MG TBCR Take 0.375 mg by mouth in the morning and at bedtime.     lansoprazole (PREVACID SOLUTAB) 15 MG disintegrating tablet Take 15 mg by mouth 2 (two) times daily before a meal.     levothyroxine (SYNTHROID) 75 MCG tablet Take 1 tablet (75 mcg total) by mouth daily before breakfast. 30 tablet 6   MAGNESIUM PO Take 250 mg by mouth daily.     meloxicam (MOBIC) 15 MG tablet Take 15 mg by mouth daily as needed for pain (Hip pain).     Menthol, Topical Analgesic, (BIOFREEZE COLORLESS) 4 % GEL Apply 1 application topically daily as needed (Hip pain).     mesalamine (LIALDA) 1.2 g EC tablet Take 2.4 g by mouth in the morning and at bedtime.      metFORMIN (GLUCOPHAGE) 500 MG tablet Take 1 tablet (500 mg total) by mouth 2 (two) times daily with a meal. 180 tablet 3   montelukast (SINGULAIR) 10 MG tablet Take 10 mg by mouth at bedtime.      Omega 3 1000 MG CAPS Take 2,000 mg by mouth every other day.     promethazine (PHENERGAN) 25 MG tablet Take 0.5-1 tablets (12.5-25 mg total) by mouth daily as needed for nausea or vomiting. 30 tablet 5   rosuvastatin (CRESTOR) 20 MG tablet Take 1 tablet (20 mg total) by mouth daily. 90 tablet 3   SYMBICORT 160-4.5 MCG/ACT inhaler INHALE 2 PUFFS INTO THE LUNGS TWICE A DAY 30.6 each 1   triamcinolone (NASACORT) 55 MCG/ACT AERO nasal inhaler Place 2 sprays into the nose daily.     Turmeric 500 MG CAPS Take 500 mg by mouth daily.     venlafaxine XR (EFFEXOR-XR) 150 MG 24 hr capsule TAKE 1 CAPSULE BY MOUTH DAILY WITH BREAKFAST. 90 capsule 1   zinc gluconate 50 MG tablet Take 50 mg by mouth daily.     spironolactone (ALDACTONE) 25 MG tablet Take 0.5 tablets (12.5 mg total) by mouth daily. 45 tablet 3   zolpidem (AMBIEN) 5 MG tablet TAKE 1 TABLET BY MOUTH EVERY DAY AT BEDTIME AS NEEDED FOR SLEEP 30 tablet 2   No current facility-administered medications for this  encounter.   General: NAD Neck: No JVD, no thyromegaly or thyroid nodule.  Lungs: Clear to auscultation bilaterally with normal respiratory effort. CV: Nondisplaced PMI.  Heart regular S1/S2, no S3/S4, no murmur.  No peripheral edema.  No carotid bruit.  Normal pedal pulses.  Abdomen: Soft, nontender, no hepatosplenomegaly, no distention.  Skin: Intact without lesions or rashes.  Neurologic: Alert and oriented x 3.  Psych: Normal affect. Extremities: No clubbing or cyanosis.  HEENT: Normal.  Assessment/Plan: 1. Chronic systolic CHF: Nonischemic cardiomyopathy.  EF noted to be low since 1/22.  LHC in 3/22 with mild nonobstructive coronary disease.  Last echo in 6/22 with EF 30-35%.  Cause uncertain, her mother had a cardiomyopathy of uncertain etiology so cannot rule out familial cardiomyopathy.  She had treatment for breast cancer but this did not include chemotherapy.  She has been on adalimumab for ulcerative colitis, but this does not commonly cause CHF.  No drugs and rare ETOH.  NYHA class II symptoms.  Not volume overloaded on exam.  - Continue Entresto 49/51 bid.  - Continue Coreg 12.5 mg bid.  - Start spironolactone 12.5 mg daily.  BMET 10 days.  - Narrow QRS, so not CRT candidate.  - I will arrange for cardiac MRI to look for evidence of infiltrative disease or myocarditis.   - Check iron studies.  - Patient is not sure yet about placement again of ICD give complicated initial ICD placement.  If she has significant delayed enhancement/scar on cMRI, I would encourage ICD placement.  2. RV perforation with ICD placement: S/p sternotomy/repair in 6/22. As above, will decide on replacement of ICD based on what MRI shows.   Followup with clinical pharmacist in 3 wks for med titration, see me in 6 wks.   Loralie Champagne 11/14/2020

## 2020-11-18 ENCOUNTER — Ambulatory Visit: Payer: No Typology Code available for payment source

## 2020-11-22 ENCOUNTER — Telehealth: Payer: Self-pay | Admitting: Family Medicine

## 2020-11-23 ENCOUNTER — Other Ambulatory Visit: Payer: Self-pay

## 2020-11-23 ENCOUNTER — Encounter (HOSPITAL_COMMUNITY): Payer: Self-pay

## 2020-11-23 ENCOUNTER — Ambulatory Visit (HOSPITAL_COMMUNITY)
Admission: RE | Admit: 2020-11-23 | Discharge: 2020-11-23 | Disposition: A | Discharge status: Home / Self Care | Payer: No Typology Code available for payment source | Source: Ambulatory Visit | Attending: Cardiology | Admitting: Cardiology

## 2020-11-23 DIAGNOSIS — I5022 Chronic systolic (congestive) heart failure: Secondary | ICD-10-CM | POA: Diagnosis present

## 2020-11-23 LAB — BASIC METABOLIC PANEL
Anion gap: 10 (ref 5–15)
BUN: <5 mg/dL — ABNORMAL LOW (ref 6–20)
CO2: 25 mmol/L (ref 22–32)
Calcium: 9.9 mg/dL (ref 8.9–10.3)
Chloride: 95 mmol/L — ABNORMAL LOW (ref 98–111)
Creatinine, Ser: 0.52 mg/dL (ref 0.44–1.00)
GFR, Estimated: >60 mL/min (ref >60)
Glucose, Bld: 148 mg/dL — ABNORMAL HIGH (ref 70–99)
Potassium: 4.3 mmol/L (ref 3.5–5.1)
Sodium: 130 mmol/L — ABNORMAL LOW (ref 135–145)

## 2020-11-23 LAB — IRON AND TIBC
Iron: 86 ug/dL (ref 28–170)
Saturation Ratios: 24 % (ref 10.4–31.8)
TIBC: 363 ug/dL (ref 250–450)
UIBC: 277 ug/dL

## 2020-11-23 LAB — FERRITIN: Ferritin: 76 ng/mL (ref 11–307)

## 2020-11-23 LAB — BRAIN NATRIURETIC PEPTIDE: B Natriuretic Peptide: 30 pg/mL (ref 0.0–100.0)

## 2020-11-26 ENCOUNTER — Other Ambulatory Visit: Payer: Self-pay | Admitting: Cardiology

## 2020-11-29 ENCOUNTER — Other Ambulatory Visit (HOSPITAL_COMMUNITY): Payer: Self-pay

## 2020-11-29 ENCOUNTER — Encounter: Payer: Self-pay | Admitting: Family Medicine

## 2020-11-29 MED ORDER — HYDROCODONE-ACETAMINOPHEN 5-325 MG PO TABS: 1.0000 | ORAL_TABLET | Freq: Three times a day (TID) | ORAL | 0 refills | Status: DC | PRN

## 2020-11-29 NOTE — Progress Notes (Signed)
PCP: Dr. Lorelei Pont Cardiology: Dr. Agustin Cree HF Cardiology: Dr. Aundra Dubin  HPI:  55 y.o. with history of ulcerative colitis, HTN, and breast cancer was referred by Dr. Agustin Cree for evaluation of CHF.   Patient's breast cancer was treated with surgery and radiation, no chemo.  Patient reports exertional dyspnea since 2019.  Patient has right hip OA, needs THR.  Echo was done in 04/2020 as part of pre-op workup, this showed low EF 25%.  She then had RHC/LHC in 06/2020 with mild nonobstructive CAD and low cardiac index 2.1.  Echo in 07/2020 showed EF still 20-25%.  She was referred for ICD.  St Jude ICD was placed in 12/2117 but complicated by RV perforation with hemothorax and hemorrhagic shock.  ICD was removed and RV was surgically repaired with sternotomy.  Patient's mother had CHF in her 41D, uncertain etiology.    Recently presented to HF Clinic for new patient visit 11/12/20. Patient generally was doing well. She was mildly short of breath walking longer distances such as around the grocery store.  She was short of breath walking up stairs.  Had no problems walking around the house.  No lightheadedness.  Rare ETOH use.  No chest pain.  No orthopnea/PND.    Today she returns to HF clinic for pharmacist medication titration. At last visit with MD, spironolactone 12.5 mg daily was initiated. Overall she is feeling ok today. Main complaint is hip pain and allergies. One incident of dizziness when she stepped up on a ladder last week, but this has not recurred. No CP or palpitations. Gets  mildly short of breath walking longer distances. States she is "having to breathe through her mouth more".  She is short of breath walking up stairs. Weight at home as been stable at 145 lbs. Not taking any diuretic. No LEE, PND or orthopnea.     HF Medications: Carvedilol 12.5 mg BID Entresto 49/51 mg BID Spironolactone 12.5 mg daily  Has the patient been experiencing any side effects to the medications prescribed?   no  Does the patient have any problems obtaining medications due to transportation or finances?   No - Geneticist, molecular of regimen: good Understanding of indications: good Potential of compliance: good Patient understands to avoid NSAIDs. Patient understands to avoid decongestants.    Pertinent Lab Values: 11/23/20: Serum creatinine 0.52, BUN <5, Potassium 4.3, Sodium 130  Vital Signs: Weight: 149.2 lbs (last clinic weight: 148.8 lbs) Blood pressure: 110/68  Heart rate: 77   Assessment/Plan: 1. Chronic systolic CHF: Nonischemic cardiomyopathy.  EF noted to be low since 04/2020.  LHC in 3/22 with mild nonobstructive coronary disease.  Echo in 09/2020 with EF 30-35%.  Cause uncertain, her mother had a cardiomyopathy of uncertain etiology so cannot rule out familial cardiomyopathy.  She had treatment for breast cancer but this did not include chemotherapy.  She has been on adalimumab for ulcerative colitis, but this does not commonly cause CHF.  No drugs and rare ETOH.  NYHA class II symptoms.  Not volume overloaded on exam.  - Continue carvedilol 12.5 mg BID.  - Continue Entresto 49/51 mg BID.  - Continue spironolactone 12.5 mg daily.   - Start Farxiga 10 mg daily - Narrow QRS, so not CRT candidate.  - Previously referred for cardiac MRI to look for evidence of infiltrative disease or myocarditis.   - Patient is not sure yet about placement again of ICD give complicated initial ICD placement.  If she has significant delayed enhancement/scar  on cMRI, would encourage ICD placement.  2. RV perforation with ICD placement: S/p sternotomy/repair in 09/2020. As above, will decide on replacement of ICD based on what MRI shows.   Follow up 2 weeks with Dr. Rush Farmer, PharmD, BCPS, [facility], [facility] Pharmacist [phone]

## 2020-12-08 ENCOUNTER — Ambulatory Visit: Payer: No Typology Code available for payment source | Admitting: Cardiology

## 2020-12-08 ENCOUNTER — Ambulatory Visit (INDEPENDENT_AMBULATORY_CARE_PROVIDER_SITE_OTHER): Payer: No Typology Code available for payment source | Admitting: Cardiology

## 2020-12-08 ENCOUNTER — Other Ambulatory Visit: Payer: Self-pay

## 2020-12-08 ENCOUNTER — Encounter: Payer: Self-pay | Admitting: Cardiology

## 2020-12-08 VITALS — BP 106/60 | HR 100 | Ht 63.5 in | Wt 146.0 lb

## 2020-12-08 DIAGNOSIS — I5022 Chronic systolic (congestive) heart failure: Secondary | ICD-10-CM | POA: Diagnosis not present

## 2020-12-08 DIAGNOSIS — E119 Type 2 diabetes mellitus without complications: Secondary | ICD-10-CM | POA: Diagnosis not present

## 2020-12-08 DIAGNOSIS — I420 Dilated cardiomyopathy: Secondary | ICD-10-CM

## 2020-12-08 DIAGNOSIS — I10 Essential (primary) hypertension: Secondary | ICD-10-CM | POA: Diagnosis not present

## 2020-12-08 NOTE — Progress Notes (Signed)
Cardiology Office Note:    Date:  12/08/2020   ID:  Erin Fields, DOB [birth year], MRN [id number]  PCP:  Darreld Mclean, MD  Cardiologist:  Jenne Campus, MD    Referring MD: Darreld Mclean, MD   Chief Complaint  Patient presents with   Follow-up  I am doing fine but he bothers me a lot  History of Present Illness:    Erin Fields is a 55 y.o. female  female with complex past medical history.  She was initially referred to [place] because of EKG being abnormal that was done before elective hip replacement surgery after that echocardiogram has been performed and surprising she was found to have 25% ejection fraction which is severely diminished, that was followed by cardiac catheterization which showed nonobstructive disease she was put on appropriate medication in spite of that her ejection fraction was still diminished, she was referred to our EP team for consideration for ICD.  ICD was implanted and everything was fine she left home however while at home she started having chest pain.  She ended going to the emergency room thinking that she may have discharged from the defibrillator, device has been interrogated no discharge has being identified she was discharged home, however presented back within the next few hours with shortness of breath.  She suffered from perforation of the right ventricle with injury of the artery leading to blood collection in the pleura.  She required open heart surgery with repair of the perforation of the right ventricle as well as evacuation of hematoma from the left pleura.  She comes today 2 months of follow-up overall she seems to be doing well.  The biggest complaint she has of pain in her hip denies have any chest pain tightness squeezing pressure burning chest.  She did see advanced congestive heart failure clinic she is scheduled to have MRI.  She is asking me again about potentially having hip surgery.  Past Medical History:  Diagnosis Date    Allergic rhinitis due to animal (cat) (dog) hair and dander 04/27/2020   Allergic rhinitis due to pollen 04/27/2020   Anxiety    Anxiety and depression 01/20/2018   Asthma    Asthma, persistent controlled 10/05/2018   B12 deficiency 11/27/2019   Borderline diabetes 12/17/2015   Cancer ([place]) 2019   right breast cancer   CERVICAL CANCER, HX OF 12/06/2006   Qualifier: Diagnosis of  By: Arnoldo Morale MD, John E    Chronic sinusitis 01/26/2015   Chronic venous insufficiency 09/02/2015   Colon polyps    Congestive heart failure ([place]) 04/29/2020   Controlled type 2 diabetes mellitus without complication, without long-term current use of insulin ([place]) 05/28/2017   Coronary artery disease 25% of RCA, 20% intermediate branch, 40% diagonal branch based on cardiac cath from 2022 07/22/2020   Crohn disease ([place]) 11/29/2016   Depression    Diabetes mellitus without complication ([place])    type 2   Dilated cardiomyopathy ([place]) ejection fraction 25% by echocardiogram from January 2022 04/29/2020   Dyspnea 09/15/2014   Elevated triglycerides with high cholesterol 11/26/2013   Environmental allergies    Eustachian tube dysfunction 12/04/2014   Ex-smoker 11/18/2015   Family history of breast cancer    Genetic testing 07/16/2017   Negative genetic testing on the multi-cancer panel.  The Multi-Gene Panel offered by [name] includes sequencing and/or deletion duplication testing of the following 83 genes: ALK, APC, ATM, AXIN2,BAP1,  BARD1, BLM, BMPR1A, BRCA1, BRCA2, BRIP1, CASR, CDC73, CDH1,  CDK4, CDKN1B, CDKN1C, CDKN2A (p14ARF), CDKN2A (p16INK4a), CEBPA, CHEK2, CTNNA1, DICER1, DIS3L2, EGFR (c.2369C>T, p.Thr790Met variant onl   GERD (gastroesophageal reflux disease)    HEADACHE 12/06/2006   Qualifier: Diagnosis of  By: Arnoldo Morale MD, John E    Hemothorax on left 10/02/2020   History of adenomatous polyp of colon 11/19/2017   Hyperlipidemia    Hypertension    Hypothyroidism    IDA (iron deficiency anemia) 11/06/2019   Impaired  fasting glucose 12/12/2017   Insomnia 08/20/2013   Irritable bowel syndrome 11/29/2006   Qualifier: Diagnosis of  By: Arnoldo Morale MD, John E    Left sided abdominal pain 12/13/2017   Liver hemangioma 08/24/2014   Malignant neoplasm of upper-outer quadrant of right breast in female, estrogen receptor positive ([place]) 03/20/2017   Migraines    Mild persistent asthma, uncomplicated 10/16/6376   Nasal polyp, unspecified 01/10/2018   Oral herpes 04/20/2013   Other allergic rhinitis 09/15/2014   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 10/02/2020   Personal history of radiation therapy 2019   36 radiation tx   PONV (postoperative nausea and vomiting)    scopolamine patch helps   Recurrent infections 10/05/2018   Right upper lobe pulmonary nodule 01/10/2018   Symptomatic varicose veins, right 09/02/2015   Ulcerative colitis ([place])    Varicose veins     Past Surgical History:  Procedure Laterality Date   BREAST BIOPSY Right 02/2017   BREAST BIOPSY Right 11/07/2019   BREAST LUMPECTOMY Right 04/24/2017   BREAST LUMPECTOMY WITH RADIOACTIVE SEED AND SENTINEL LYMPH NODE BIOPSY Right 04/24/2017   Procedure: RIGHT BREAST LUMPECTOMY WITH RADIOACTIVE SEED AND SENTINEL LYMPH NODE BIOPSY;  Surgeon: [name], MD;  Location: [facility];  Service: General;  Laterality: Right;   CORONARY ARTERY BYPASS GRAFT     crapal tunnel relase Left 05/2018   ENDOMETRIAL ABLATION  2010   Had abnormal uterine bleeding (heavy and frequent), performed in [place], menses stopped completely 2 years after   EXPLORATORY LAPAROTOMY     Had exploratory surgery at the age of 32 for abdominal pain with cyst found on her left ovary, not treated surgically   FOOT SURGERY Right 2012   GENERATOR REMOVAL  10/02/2020   Procedure: GENERATOR REMOVAL;  Surgeon: Rexene Alberts, MD;  Location: [facility];  Service: Thoracic;;   HAND SURGERY Right    ICD IMPLANT N/A 10/01/2020   Procedure: ICD IMPLANT;  Surgeon: Constance Haw, MD;  Location: [facility];  Service: Cardiovascular;  Laterality: N/A;   ICD LEAD REMOVAL N/A 10/02/2020   Procedure: ICD LEAD REMOVAL;  Surgeon: Rexene Alberts, MD;  Location: [name];  Service: Thoracic;  Laterality: N/A;   MEDIASTERNOTOMY N/A 10/02/2020   Procedure: MEDIAN STERNOTOMY REPAIR PERFORATED VENTRICLE EVACUATION LEFT HEMOTHORAX;  Surgeon: Rexene Alberts, MD;  Location: [facility];  Service: Thoracic;  Laterality: N/A;   RE-EXCISION OF BREAST LUMPECTOMY Right 05/15/2017   Procedure: RE-EXCISION OF BREAST LUMPECTOMY;  Surgeon: [name], MD;  Location: [facility];  Service: General;  Laterality: Right;   RIGHT/LEFT HEART CATH AND CORONARY ANGIOGRAPHY N/A 06/21/2020   Procedure: RIGHT/LEFT HEART CATH AND CORONARY ANGIOGRAPHY;  Surgeon: Nelva Bush, MD;  Location: [facility];  Service: Cardiovascular;  Laterality: N/A;   ROBOTIC ASSISTED SALPINGO OOPHERECTOMY Bilateral 08/29/2019   Procedure: XI ROBOTIC ASSISTED SALPINGO OOPHORECTOMY;  Surgeon: [name], MD;  Location[facility];  Service: Gynecology;  Laterality: Bilateral;   Septoplasty  with turbinate reduction     TEE WITHOUT CARDIOVERSION N/A 10/02/2020   Procedure: TRANSESOPHAGEAL ECHOCARDIOGRAM (TEE);  Surgeon: Rexene Alberts, MD;  Location: [facility];  Service: Thoracic;  Laterality: N/A;   TONSILLECTOMY  1980   VEIN SURGERY     Removal Right Leg   WISDOM TOOTH EXTRACTION      Current Medications: Current Meds  Medication Sig   acyclovir (ZOVIRAX) 400 MG tablet TAKE 1 TABLET (400 MG TOTAL) BY MOUTH 3 (THREE) TIMES DAILY AS NEEDED. (Patient taking differently: Take 400 mg by mouth 3 (three) times daily as needed (Cold sore).)   Adalimumab 80 MG/0.8ML PNKT Inject 80 mg into the skin every 14 (fourteen) days.   albuterol (VENTOLIN HFA) 108 (90 Base) MCG/ACT inhaler Inhale 2 puffs into the lungs every 6 (six) hours as needed for shortness of breath.    ascorbic acid (VITAMIN C) 1000 MG tablet Take 1,000 mg by mouth daily.   aspirin EC 81 MG tablet Take 81 mg by mouth daily. Swallow whole.   Azelastine HCl 0.15 % SOLN Place 2 sprays into both nostrils 2 (two) times daily. For 30 days   Biotin 1000 MCG tablet Take 1,000 mcg by mouth daily.   Calcium Carb-Cholecalciferol (CALCIUM+D3) 600-800 MG-UNIT TABS Take 1 tablet by mouth daily.   carvedilol (COREG) 12.5 MG tablet TAKE 1 TABLET BY MOUTH 2 TIMES DAILY. (Patient taking differently: Take 12.5 mg by mouth 2 (two) times daily with a meal.)   cetirizine (ZYRTEC) 10 MG tablet Take 10 mg by mouth daily.   diphenoxylate-atropine (LOMOTIL) 2.5-0.025 MG tablet Take 2 tablets by mouth 4 (four) times daily as needed for diarrhea or loose stools.   doxycycline (VIBRAMYCIN) 100 MG capsule Take 1 capsule (100 mg total) by mouth 2 (two) times daily.   ENTRESTO 49-51 MG TAKE 1 TABLET BY MOUTH TWICE A DAY (Patient taking differently: Take 1 tablet by mouth 2 (two) times daily.)   EPINEPHrine 0.3 mg/0.3 mL IJ SOAJ injection Inject 0.3 mg into the muscle as needed for anaphylaxis.   exemestane (AROMASIN) 25 MG tablet TAKE 1 TABLET (25 MG TOTAL) BY MOUTH DAILY AFTER BREAKFAST. (Patient taking differently: Take 25 mg by mouth daily after breakfast.)   Glucosamine-Chondroit-Vit C-Mn (GLUCOSAMINE-CHONDROITIN) CAPS Take 1 capsule by mouth daily. Unknown strenght   HYDROcodone-acetaminophen (NORCO/VICODIN) 5-325 MG tablet Take 1-2 tablets by mouth every 8 (eight) hours as needed. (Patient taking differently: Take 1-2 tablets by mouth every 8 (eight) hours as needed for severe pain or moderate pain.)   Hyoscyamine Sulfate 0.375 MG TBCR Take 0.375 mg by mouth in the morning and at bedtime.   lansoprazole (PREVACID SOLUTAB) 15 MG disintegrating tablet Take 15 mg by mouth 2 (two) times daily before a meal.   levothyroxine (SYNTHROID) 75 MCG tablet Take 1 tablet (75 mcg total) by mouth daily before breakfast.   MAGNESIUM PO  Take 250 mg by mouth daily.   meloxicam (MOBIC) 15 MG tablet Take 15 mg by mouth daily as needed for pain (Hip pain).   Menthol, Topical Analgesic, (BIOFREEZE COLORLESS) 4 % GEL Apply 1 application topically daily as needed (Hip pain).   mesalamine (LIALDA) 1.2 g EC tablet Take 2.4 g by mouth in the morning and at bedtime.    metFORMIN (GLUCOPHAGE) 500 MG tablet Take 1 tablet (500 mg total) by mouth 2 (two) times daily with a meal.   montelukast (SINGULAIR) 10 MG tablet Take 10 mg by mouth at bedtime.    Omega 3  1000 MG CAPS Take 2,000 mg by mouth every other day.   promethazine (PHENERGAN) 25 MG tablet Take 0.5-1 tablets (12.5-25 mg total) by mouth daily as needed for nausea or vomiting.   rosuvastatin (CRESTOR) 20 MG tablet Take 1 tablet (20 mg total) by mouth daily.   spironolactone (ALDACTONE) 25 MG tablet Take 0.5 tablets (12.5 mg total) by mouth daily.   SYMBICORT 160-4.5 MCG/ACT inhaler INHALE 2 PUFFS INTO THE LUNGS TWICE A DAY (Patient taking differently: Inhale 2 puffs into the lungs 2 (two) times daily.)   triamcinolone (NASACORT) 55 MCG/ACT AERO nasal inhaler Place 2 sprays into the nose daily.   Turmeric 500 MG CAPS Take 500 mg by mouth daily.   venlafaxine XR (EFFEXOR-XR) 150 MG 24 hr capsule TAKE 1 CAPSULE BY MOUTH DAILY WITH BREAKFAST. (Patient taking differently: Take 150 mg by mouth daily with breakfast.)   zinc gluconate 50 MG tablet Take 50 mg by mouth daily.   zolpidem (AMBIEN) 5 MG tablet TAKE 1 TABLET BY MOUTH EVERY DAY AT BEDTIME AS NEEDED FOR SLEEP (Patient taking differently: Take 5 mg by mouth at bedtime as needed for sleep.)     Allergies:   Losartan potassium, Ace inhibitors, Aspirin, Cefaclor, Oxycodone, Sulfa antibiotics, and Lisinopril   Social History   Socioeconomic History   Marital status: Widowed    Spouse name: Not on file   Number of children: Not on file   Years of education: Not on file   Highest education level: Not on file  Occupational History    Not on file  Tobacco Use   Smoking status: Former    Packs/day: 3.00    Years: 15.00    Pack years: 45.00    Types: Cigarettes    Quit date: 09/14/1993    Years since quitting: 27.2   Smokeless tobacco: Never   Tobacco comments:    Quit 26 yrs ago  Vaping Use   Vaping Use: Never used  Substance and Sexual Activity   Alcohol use: Yes    Comment: Drinks a couple every 2-3 months   Drug use: No   Sexual activity: Not Currently    Comment: ablation  Other Topics Concern   Not on file  Social History Narrative   Not on file   Social Determinants of Health   Financial Resource Strain: Not on file  Food Insecurity: Not on file  Transportation Needs: Not on file  Physical Activity: Not on file  Stress: Not on file  Social Connections: Not on file     Family History: The patient's family history includes Arthritis (age of onset: 46) in her mother; Brain cancer in her father; Breast cancer in her mother; Colitis in her sister; Crohn's disease in her mother; Diabetes in her maternal grandfather and mother; Heart attack in her maternal grandfather; Heart disease in her maternal grandfather and mother; Hyperlipidemia in her mother; Hypertension in her mother; Leukemia in her maternal grandfather; Leukemia (age of onset: 3) in her cousin; Lung cancer in her father and mother. ROS:   Please see the history of present illness.    All 14 point review of systems negative except as described per history of present illness  EKGs/Labs/Other Studies Reviewed:      Recent Labs: 09/27/2020: TSH 0.31 11/11/2020: ALT 16; Hemoglobin 13.4; Magnesium 1.6; Platelets 445.0 11/23/2020: B Natriuretic Peptide 30.0; BUN <5; Creatinine, Ser 0.52; Potassium 4.3; Sodium 130  Recent Lipid Panel    Component Value Date/Time   CHOL 128 08/10/2020  0936   TRIG 153 (H) 08/10/2020 0936   HDL 37 (L) 08/10/2020 0936   CHOLHDL 3.5 08/10/2020 0936   CHOLHDL 4 06/18/2019 0952   VLDL 31.8 06/18/2019 0952   LDLCALC  65 08/10/2020 0936   LDLDIRECT 101.0 12/02/2018 0955    Physical Exam:    VS:  BP 106/60 (BP Location: Left Arm, Patient Position: Sitting)   Pulse 100   Ht 5' 3.5" (1.613 m)   Wt 146 lb (66.2 kg)   SpO2 96%   BMI 25.46 kg/m     Wt Readings from Last 3 Encounters:  12/08/20 146 lb (66.2 kg)  11/12/20 148 lb 12.8 oz (67.5 kg)  11/11/20 149 lb 3.2 oz (67.7 kg)     GEN:  Well nourished, well developed in no acute distress HEENT: Normal NECK: No JVD; No carotid bruits LYMPHATICS: No lymphadenopathy CARDIAC: RRR, no murmurs, no rubs, no gallops RESPIRATORY:  Clear to auscultation without rales, wheezing or rhonchi  ABDOMEN: Soft, non-tender, non-distended MUSCULOSKELETAL:  No edema; No deformity  SKIN: Warm and dry LOWER EXTREMITIES: no swelling NEUROLOGIC:  Alert and oriented x 3 PSYCHIATRIC:  Normal affect   ASSESSMENT:    1. Dilated cardiomyopathy (HCC) ejection fraction 25% by echocardiogram from January 2022   2. Chronic systolic congestive heart failure ([place])   3. Primary hypertension   4. Controlled type 2 diabetes mellitus without complication, without long-term current use of insulin ([facility])    PLAN:    In order of problems listed above:  Dilated cardiomyopathy with severely reduced left ventricle ejection fraction she is on appropriate medication the problem is blood pressure being low.  Recently she was put back on Aldactone which her blood pressure low but kidney function normal potassium normal.  We will continue.  She is scheduled to have MRI to make sure that she does not have any late gadolinium enhancement Chronic congestive heart failure.  Her New York Heart Association class II.  She seems to be compensated on physical exam we will continue present management. Essential hypertension blood pressure well controlled now. Diabetes, hemoglobin A1c from K PN from 09/27/2020 is 6.3.  We will continue present management. Hip pain.  She is pushing towards hip  surgery.  She is hoping to have that surgery done in January.  I told her a lot depends on what MRI will show [place].  And overall she will be considered high risk surgery candidate because of significant cardiomyopathy.  However she told me straight that she was able to survive open heart surgery so she would be able to survive hip surgery.  We will continue this conversation.   Medication Adjustments/Labs and Tests Ordered: Current medicines are reviewed at length with the patient today.  Concerns regarding medicines are outlined above.  No orders of the defined types were placed in this encounter.  Medication changes: No orders of the defined types were placed in this encounter.   Signed, Park Liter, MD, [facility] 12/08/2020 10:50 AM    [place]

## 2020-12-08 NOTE — Patient Instructions (Signed)

## 2020-12-13 ENCOUNTER — Ambulatory Visit (HOSPITAL_COMMUNITY)
Admission: RE | Admit: 2020-12-13 | Discharge: 2020-12-13 | Disposition: A | Discharge status: Home / Self Care | Payer: No Typology Code available for payment source | Source: Ambulatory Visit | Attending: Internal Medicine | Admitting: Internal Medicine

## 2020-12-13 ENCOUNTER — Other Ambulatory Visit: Payer: Self-pay

## 2020-12-13 VITALS — BP 110/68 | HR 77 | Wt 149.2 lb

## 2020-12-13 DIAGNOSIS — R0602 Shortness of breath: Secondary | ICD-10-CM | POA: Diagnosis present

## 2020-12-13 DIAGNOSIS — M25559 Pain in unspecified hip: Secondary | ICD-10-CM | POA: Insufficient documentation

## 2020-12-13 DIAGNOSIS — I428 Other cardiomyopathies: Secondary | ICD-10-CM | POA: Insufficient documentation

## 2020-12-13 DIAGNOSIS — Z79899 Other long term (current) drug therapy: Secondary | ICD-10-CM | POA: Diagnosis not present

## 2020-12-13 DIAGNOSIS — Z7901 Long term (current) use of anticoagulants: Secondary | ICD-10-CM | POA: Diagnosis not present

## 2020-12-13 DIAGNOSIS — I5022 Chronic systolic (congestive) heart failure: Secondary | ICD-10-CM | POA: Diagnosis not present

## 2020-12-13 MED ORDER — DAPAGLIFLOZIN PROPANEDIOL 10 MG PO TABS: 10.0000 mg | ORAL_TABLET | Freq: Every day | ORAL | 11 refills | Status: DC

## 2020-12-13 NOTE — Patient Instructions (Signed)
It was a pleasure seeing you today!  MEDICATIONS: -We are changing your medications today -Start Farxiga 10 mg (1 tablet) daily -Call if you have questions about your medications.   NEXT APPOINTMENT: Return to clinic in 2 weeks with Dr. Aundra Dubin.  In general, to take care of your heart failure: -Limit your fluid intake to 2 Liters (half-gallon) per day.   -Limit your salt intake to ideally 2-3 grams (2000-3000 mg) per day. -Weigh yourself daily and record, and bring that "weight diary" to your next appointment.  (Weight gain of 2-3 pounds in 1 day typically means fluid weight.) -The medications for your heart are to help your heart and help you live longer.   -Please contact us before stopping any of your heart medications.  Call the clinic at [phone] with questions or to reschedule future appointments.

## 2020-12-22 ENCOUNTER — Ambulatory Visit
Admission: RE | Admit: 2020-12-22 | Discharge: 2020-12-22 | Disposition: A | Discharge status: Home / Self Care | Payer: No Typology Code available for payment source | Source: Ambulatory Visit | Attending: General Surgery | Admitting: General Surgery

## 2020-12-22 ENCOUNTER — Other Ambulatory Visit: Payer: Self-pay

## 2020-12-22 DIAGNOSIS — Z1231 Encounter for screening mammogram for malignant neoplasm of breast: Secondary | ICD-10-CM

## 2020-12-22 HISTORY — DX: Malignant neoplasm of unspecified site of unspecified female breast: C50.919

## 2020-12-30 ENCOUNTER — Encounter: Payer: Self-pay | Admitting: Family Medicine

## 2020-12-30 MED ORDER — HYDROCODONE-ACETAMINOPHEN 5-325 MG PO TABS: 1.0000 | ORAL_TABLET | Freq: Three times a day (TID) | ORAL | 0 refills | Status: DC | PRN

## 2020-12-31 ENCOUNTER — Other Ambulatory Visit: Payer: Self-pay

## 2020-12-31 ENCOUNTER — Encounter (HOSPITAL_COMMUNITY): Payer: Self-pay | Admitting: Cardiology

## 2020-12-31 ENCOUNTER — Ambulatory Visit (HOSPITAL_COMMUNITY)
Admission: RE | Admit: 2020-12-31 | Discharge: 2020-12-31 | Disposition: A | Discharge status: Home / Self Care | Payer: No Typology Code available for payment source | Source: Ambulatory Visit | Attending: Cardiology | Admitting: Cardiology

## 2020-12-31 VITALS — BP 110/70 | HR 93 | Ht 63.5 in | Wt 151.2 lb

## 2020-12-31 DIAGNOSIS — I5022 Chronic systolic (congestive) heart failure: Secondary | ICD-10-CM | POA: Diagnosis not present

## 2020-12-31 DIAGNOSIS — I110 Hypertensive heart disease with heart failure: Secondary | ICD-10-CM | POA: Insufficient documentation

## 2020-12-31 DIAGNOSIS — Z8616 Personal history of COVID-19: Secondary | ICD-10-CM | POA: Insufficient documentation

## 2020-12-31 DIAGNOSIS — Z7984 Long term (current) use of oral hypoglycemic drugs: Secondary | ICD-10-CM | POA: Insufficient documentation

## 2020-12-31 DIAGNOSIS — Z09 Encounter for follow-up examination after completed treatment for conditions other than malignant neoplasm: Secondary | ICD-10-CM | POA: Diagnosis not present

## 2020-12-31 DIAGNOSIS — Z79899 Other long term (current) drug therapy: Secondary | ICD-10-CM | POA: Insufficient documentation

## 2020-12-31 DIAGNOSIS — Z8249 Family history of ischemic heart disease and other diseases of the circulatory system: Secondary | ICD-10-CM | POA: Insufficient documentation

## 2020-12-31 DIAGNOSIS — R0600 Dyspnea, unspecified: Secondary | ICD-10-CM | POA: Insufficient documentation

## 2020-12-31 DIAGNOSIS — I2510 Atherosclerotic heart disease of native coronary artery without angina pectoris: Secondary | ICD-10-CM | POA: Insufficient documentation

## 2020-12-31 DIAGNOSIS — M25551 Pain in right hip: Secondary | ICD-10-CM | POA: Diagnosis not present

## 2020-12-31 DIAGNOSIS — Z7982 Long term (current) use of aspirin: Secondary | ICD-10-CM | POA: Diagnosis not present

## 2020-12-31 DIAGNOSIS — Z791 Long term (current) use of non-steroidal anti-inflammatories (NSAID): Secondary | ICD-10-CM | POA: Diagnosis not present

## 2020-12-31 DIAGNOSIS — M1611 Unilateral primary osteoarthritis, right hip: Secondary | ICD-10-CM | POA: Insufficient documentation

## 2020-12-31 DIAGNOSIS — I428 Other cardiomyopathies: Secondary | ICD-10-CM | POA: Diagnosis not present

## 2020-12-31 DIAGNOSIS — I5020 Unspecified systolic (congestive) heart failure: Secondary | ICD-10-CM

## 2020-12-31 LAB — BASIC METABOLIC PANEL
Anion gap: 9 (ref 5–15)
BUN: 7 mg/dL (ref 6–20)
CO2: 25 mmol/L (ref 22–32)
Calcium: 9.3 mg/dL (ref 8.9–10.3)
Chloride: 105 mmol/L (ref 98–111)
Creatinine, Ser: 0.7 mg/dL (ref 0.44–1.00)
GFR, Estimated: >60 mL/min (ref >60)
Glucose, Bld: 123 mg/dL — ABNORMAL HIGH (ref 70–99)
Potassium: 4.3 mmol/L (ref 3.5–5.1)
Sodium: 139 mmol/L (ref 135–145)

## 2020-12-31 LAB — BRAIN NATRIURETIC PEPTIDE: B Natriuretic Peptide: 36.4 pg/mL (ref 0.0–100.0)

## 2020-12-31 MED ORDER — SPIRONOLACTONE 25 MG PO TABS: 25.0000 mg | ORAL_TABLET | Freq: Every day | ORAL | 3 refills | Status: DC

## 2020-12-31 NOTE — Patient Instructions (Signed)
Increase Spironolactone to 25 mg ( 1 tablet) daily  Labs done today, your results will be available in MyChart, we will contact you for abnormal readings.  Your physician recommends that you schedule a follow-up  lab appointment  10 days for lab work  Please follow up with our heart failure pharmacist in 3 weeks  Your physician recommends that you schedule a follow-up appointment in: 2 months   If you have any questions or concerns before your next appointment please send [place] a message through [place] or call our office at [phone].    TO LEAVE A MESSAGE FOR THE NURSE SELECT OPTION 2, PLEASE LEAVE A MESSAGE INCLUDING: YOUR NAME DATE OF BIRTH CALL BACK NUMBER REASON FOR CALL**this is important as we prioritize the call backs  YOU WILL RECEIVE A CALL BACK THE SAME DAY AS LONG AS YOU CALL BEFORE 4:00 PM At the [facility], you and your health needs are our priority. As part of our continuing mission to provide you with exceptional heart care, we have created designated Provider Care Teams. These Care Teams include your primary Cardiologist (physician) and Advanced Practice Providers (APPs- Physician Assistants and Nurse Practitioners) who all work together to provide you with the care you need, when you need it.   You may see any of the following providers on your designated Care Team at your next follow up: Dr Glori Bickers Dr Loralie Champagne Dr Patrice Paradise, NP Lyda Jester, [place] Ginnie Smart Audry Riles, PharmD   Please be sure to bring in all your medications bottles to every appointment.

## 2021-01-03 NOTE — Progress Notes (Signed)
PCP: Dr. Lorelei Pont Cardiology: Dr. Agustin Cree  55 y.o. with history of ulcerative colitis, HTN, and breast cancer was referred by Dr. Agustin Cree for evaluation of CHF.   Patient's breast cancer was treated with surgery and radiation, no chemo.  Patient reports exertional dyspnea since 2019.  Patient has right hip OA, needs THR.  Echo was done in 1/22 as part of pre-op workup, this showed low EF 25%.  She then had RHC/LHC in 3/22 with mild nonobstructive CAD and low cardiac index 2.1.  Echo in 4/22 showed EF still 20-25%.  She was referred for ICD.  St Jude ICD was placed in 0/92 but complicated by RV perforation with hemothorax and hemorrhagic shock.  ICD was removed and RV was surgically repaired with sternotomy.  Patient's mother had CHF in her 33A, uncertain etiology.   Patient returns for followup of CHF.  Main complaint currently is right hip pain.  She has significant osteoarthritis and wants to have THR done.  She has dyspnea with moderate exertion, thinks she is deconditioned due to limited exercise.  She can make it up stairs limited by pain but not dyspnea.  No orthopnea/PND.  No chest pain.    Labs (7/22): K 4.2, creatinine 0.58 Labs (8/22): K 4.3, creatinine 0.52, BNP 30  ECG (personally reviewed): NSR, lateral TWIs   PMH: 1.  Cervical cancer 2.  B12 deficiency 3.  Asthma 4.  Type 2 diabetes 5.  Ulcerative colitis 6.  HTN 7.  Hypothyroidism 8.  Irritable bowel syndrome 9.  Breast cancer: Right-sided, surgery and radiation but no chemotherapy.  10.  Chronic systolic CHF: Nonischemic cardiomyopathy.  - Echo (1/22): EF 25% - LHC/RHC (3/22): Mild nonobstructive CAD; mean RA 3, PA 24/11, CI 2.1.  - Echo (4/22): EF 20-25%, mild LV dilation, normal RV.  - Echo (6/22): EF 30-35%  - St Jude ICD placed 0/76, complicated by RV perforation requiring sternotomy due to hemothorax/hemorrhagic shock, removal of ICD and surgical repair of RV perforation.  11.  Right hip OA: Needs THR.  12.   COVID-19 12/21  FH: Grandfather with MI, mother with CHF in her 26s.    SH: Lives in [place], nonsmoker, rare EtoH.   ROS: All systems reviewed and negative except as per HPI.  Current Outpatient Medications  Medication Sig Dispense Refill   acyclovir (ZOVIRAX) 400 MG tablet TAKE 1 TABLET (400 MG TOTAL) BY MOUTH 3 (THREE) TIMES DAILY AS NEEDED. 270 tablet 1   Adalimumab 80 MG/0.8ML PNKT Inject 80 mg into the skin every 14 (fourteen) days.     albuterol (VENTOLIN HFA) 108 (90 Base) MCG/ACT inhaler Inhale 2 puffs into the lungs every 6 (six) hours as needed for shortness of breath.     ascorbic acid (VITAMIN C) 1000 MG tablet Take 1,000 mg by mouth daily.     aspirin EC 81 MG tablet Take 81 mg by mouth daily. Swallow whole.     Azelastine HCl 0.15 % SOLN Place 2 sprays into both nostrils 2 (two) times daily. For 30 days     Biotin 1000 MCG tablet Take 1,000 mcg by mouth daily.     Calcium Carb-Cholecalciferol (CALCIUM+D3) 600-800 MG-UNIT TABS Take 1 tablet by mouth daily.     carvedilol (COREG) 12.5 MG tablet TAKE 1 TABLET BY MOUTH 2 TIMES DAILY. 180 tablet 1   cetirizine (ZYRTEC) 10 MG tablet Take 10 mg by mouth daily.     dapagliflozin propanediol (FARXIGA) 10 MG TABS tablet Take 1 tablet (10 mg  total) by mouth daily before breakfast. 30 tablet 11   diphenoxylate-atropine (LOMOTIL) 2.5-0.025 MG tablet Take 2 tablets by mouth 4 (four) times daily as needed for diarrhea or loose stools.     ENTRESTO 49-51 MG TAKE 1 TABLET BY MOUTH TWICE A DAY 180 tablet 3   EPINEPHrine 0.3 mg/0.3 mL IJ SOAJ injection Inject 0.3 mg into the muscle as needed for anaphylaxis.     exemestane (AROMASIN) 25 MG tablet TAKE 1 TABLET (25 MG TOTAL) BY MOUTH DAILY AFTER BREAKFAST. 90 tablet 1   Glucosamine-Chondroit-Vit C-Mn (GLUCOSAMINE-CHONDROITIN) CAPS Take 1 capsule by mouth daily. Unknown strenght     HYDROcodone-acetaminophen (NORCO/VICODIN) 5-325 MG tablet Take 1-2 tablets by mouth every 8 (eight) hours as  needed. 20 tablet 0   Hyoscyamine Sulfate 0.375 MG TBCR Take 0.375 mg by mouth in the morning and at bedtime.     lansoprazole (PREVACID SOLUTAB) 15 MG disintegrating tablet Take 15 mg by mouth 2 (two) times daily before a meal.     levothyroxine (SYNTHROID) 75 MCG tablet Take 1 tablet (75 mcg total) by mouth daily before breakfast. 30 tablet 6   MAGNESIUM PO Take 250 mg by mouth daily.     meloxicam (MOBIC) 15 MG tablet Take 15 mg by mouth daily as needed for pain (Hip pain).     Menthol, Topical Analgesic, (BIOFREEZE COLORLESS) 4 % GEL Apply 1 application topically daily as needed (Hip pain).     mesalamine (LIALDA) 1.2 g EC tablet Take 2.4 g by mouth in the morning and at bedtime.      metFORMIN (GLUCOPHAGE) 500 MG tablet Take 1 tablet (500 mg total) by mouth 2 (two) times daily with a meal. 180 tablet 3   montelukast (SINGULAIR) 10 MG tablet Take 10 mg by mouth at bedtime.      Omega 3 1000 MG CAPS Take 2,000 mg by mouth every other day.     promethazine (PHENERGAN) 25 MG tablet Take 0.5-1 tablets (12.5-25 mg total) by mouth daily as needed for nausea or vomiting. 30 tablet 5   rosuvastatin (CRESTOR) 20 MG tablet Take 1 tablet (20 mg total) by mouth daily. 90 tablet 3   SYMBICORT 160-4.5 MCG/ACT inhaler INHALE 2 PUFFS INTO THE LUNGS TWICE A DAY 30.6 each 1   triamcinolone (NASACORT) 55 MCG/ACT AERO nasal inhaler Place 2 sprays into the nose daily.     Turmeric 500 MG CAPS Take 500 mg by mouth daily.     venlafaxine XR (EFFEXOR-XR) 150 MG 24 hr capsule TAKE 1 CAPSULE BY MOUTH DAILY WITH BREAKFAST. 90 capsule 1   zinc gluconate 50 MG tablet Take 50 mg by mouth daily.     zolpidem (AMBIEN) 5 MG tablet TAKE 1 TABLET BY MOUTH EVERY DAY AT BEDTIME AS NEEDED FOR SLEEP 30 tablet 2   spironolactone (ALDACTONE) 25 MG tablet Take 1 tablet (25 mg total) by mouth daily. 45 tablet 3   No current facility-administered medications for this encounter.   General: NAD Neck: No JVD, no thyromegaly or thyroid  nodule.  Lungs: Clear to auscultation bilaterally with normal respiratory effort. CV: Nondisplaced PMI.  Heart regular S1/S2, no S3/S4, no murmur.  No peripheral edema.  No carotid bruit.  Normal pedal pulses.  Abdomen: Soft, nontender, no hepatosplenomegaly, no distention.  Skin: Intact without lesions or rashes.  Neurologic: Alert and oriented x 3.  Psych: Normal affect. Extremities: No clubbing or cyanosis.  HEENT: Normal.   Assessment/Plan: 1. Chronic systolic CHF: Nonischemic cardiomyopathy.  EF noted to be low since 1/22.  LHC in 3/22 with mild nonobstructive coronary disease.  Last echo in 6/22 with EF 30-35%.  Cause uncertain, her mother had a cardiomyopathy of uncertain etiology so cannot rule out familial cardiomyopathy.  She had treatment for breast cancer but this did not include chemotherapy.  She has been on adalimumab for ulcerative colitis, but this does not commonly cause CHF.  No drugs and rare ETOH.  NYHA class II symptoms, more limited by hip pain.  Not volume overloaded on exam.  - Continue Entresto 49/51 bid.  - Continue Coreg 12.5 mg bid.  - Continue dapagliflozin 10 mg daily.  - Increase spironolactone to 25 mg daily.  BMET/BNP today and in 10 days.  - Narrow QRS, so not CRT candidate.  - Cardiac MRI to look for evidence of infiltrative disease or myocarditis is scheduled for 9/28.   - Patient is not sure yet about placement again of ICD give complicated initial ICD placement.  If she has significant delayed enhancement/scar on cMRI (to be done later this month), I would encourage ICD placement.  2. RV perforation with ICD placement: S/p sternotomy/repair in 6/22. As above, will decide on replacement of ICD based on what MRI shows.  3. Hip OA: Patient will would likely be an acceptable candidate in the future for THR.  She has minimal dyspnea.  This should be done at [facility] with close availability of cardiology.  Would like to reassess LV/RV function via MRI (later  this month) prior to her having this done.   Followup with clinical pharmacist in 3 wks for med titration, see me in 2 months.   Loralie Champagne 01/03/2021

## 2021-01-10 ENCOUNTER — Telehealth (HOSPITAL_COMMUNITY): Payer: Self-pay | Admitting: Emergency Medicine

## 2021-01-10 ENCOUNTER — Encounter: Payer: No Typology Code available for payment source | Admitting: Cardiology

## 2021-01-10 NOTE — Telephone Encounter (Signed)
Reaching out to patient to offer assistance regarding upcoming cardiac imaging study; pt verbalizes understanding of appt date/time, parking situation and where to check in, and verified current allergies; name and call back number provided for further questions should they arise Marchia Bond RN Navigator Cardiac Imaging [facility] [phone] office [phone] cell  Denies claustro  Denies implants

## 2021-01-11 ENCOUNTER — Other Ambulatory Visit: Payer: Self-pay | Admitting: Family Medicine

## 2021-01-11 ENCOUNTER — Encounter: Payer: Self-pay | Admitting: Family Medicine

## 2021-01-11 DIAGNOSIS — F5104 Psychophysiologic insomnia: Secondary | ICD-10-CM

## 2021-01-11 MED ORDER — ZOLPIDEM TARTRATE 5 MG PO TABS: ORAL_TABLET | ORAL | 2 refills | Status: DC

## 2021-01-12 ENCOUNTER — Ambulatory Visit (HOSPITAL_COMMUNITY)
Admission: RE | Admit: 2021-01-12 | Discharge: 2021-01-12 | Disposition: A | Discharge status: Home / Self Care | Payer: No Typology Code available for payment source | Source: Ambulatory Visit | Attending: Cardiology | Admitting: Cardiology

## 2021-01-12 ENCOUNTER — Other Ambulatory Visit (HOSPITAL_COMMUNITY): Payer: No Typology Code available for payment source

## 2021-01-12 ENCOUNTER — Other Ambulatory Visit: Payer: Self-pay

## 2021-01-12 DIAGNOSIS — I5022 Chronic systolic (congestive) heart failure: Secondary | ICD-10-CM | POA: Insufficient documentation

## 2021-01-12 DIAGNOSIS — I5020 Unspecified systolic (congestive) heart failure: Secondary | ICD-10-CM | POA: Diagnosis present

## 2021-01-12 LAB — BASIC METABOLIC PANEL
Anion gap: 7 (ref 5–15)
BUN: 9 mg/dL (ref 6–20)
CO2: 25 mmol/L (ref 22–32)
Calcium: 9.7 mg/dL (ref 8.9–10.3)
Chloride: 104 mmol/L (ref 98–111)
Creatinine, Ser: 0.63 mg/dL (ref 0.44–1.00)
GFR, Estimated: >60 mL/min (ref >60)
Glucose, Bld: 131 mg/dL — ABNORMAL HIGH (ref 70–99)
Potassium: 5.1 mmol/L (ref 3.5–5.1)
Sodium: 136 mmol/L (ref 135–145)

## 2021-01-12 MED ORDER — GADOBUTROL 1 MMOL/ML IV SOLN
7.0000 mL | Freq: Once | INTRAVENOUS | Status: AC | PRN
  Administered 2021-01-12: 7 mL via INTRAVENOUS

## 2021-01-19 ENCOUNTER — Encounter: Payer: Self-pay | Admitting: Hematology

## 2021-01-21 ENCOUNTER — Inpatient Hospital Stay: Payer: No Typology Code available for payment source

## 2021-01-21 ENCOUNTER — Inpatient Hospital Stay: Payer: No Typology Code available for payment source | Attending: Hematology | Admitting: Hematology

## 2021-01-21 ENCOUNTER — Other Ambulatory Visit: Payer: Self-pay

## 2021-01-21 ENCOUNTER — Encounter: Payer: Self-pay | Admitting: Hematology

## 2021-01-21 VITALS — BP 125/68 | HR 90 | Temp 98.3°F | Resp 18 | Ht 63.5 in | Wt 152.2 lb

## 2021-01-21 DIAGNOSIS — C50411 Malignant neoplasm of upper-outer quadrant of right female breast: Secondary | ICD-10-CM | POA: Diagnosis not present

## 2021-01-21 DIAGNOSIS — Z170 Estrogen receptor positive status [ER+]: Secondary | ICD-10-CM | POA: Diagnosis not present

## 2021-01-21 DIAGNOSIS — Z23 Encounter for immunization: Secondary | ICD-10-CM

## 2021-01-21 LAB — CBC WITH DIFFERENTIAL (CANCER CENTER ONLY)
Abs Immature Granulocytes: 0.05 10*3/uL (ref 0.00–0.07)
Basophils Absolute: 0.1 10*3/uL (ref 0.0–0.1)
Basophils Relative: 1 %
Eosinophils Absolute: 0.3 10*3/uL (ref 0.0–0.5)
Eosinophils Relative: 4 %
HCT: 42.7 % (ref 36.0–46.0)
Hemoglobin: 14.2 g/dL (ref 12.0–15.0)
Immature Granulocytes: 1 %
Lymphocytes Relative: 28 %
Lymphs Abs: 2.4 10*3/uL (ref 0.7–4.0)
MCH: 29.6 pg (ref 26.0–34.0)
MCHC: 33.3 g/dL (ref 30.0–36.0)
MCV: 89.1 fL (ref 80.0–100.0)
Monocytes Absolute: 0.8 10*3/uL (ref 0.1–1.0)
Monocytes Relative: 10 %
Neutro Abs: 4.9 10*3/uL (ref 1.7–7.7)
Neutrophils Relative %: 56 %
Platelet Count: 370 10*3/uL (ref 150–400)
RBC: 4.79 MIL/uL (ref 3.87–5.11)
RDW: 12.4 % (ref 11.5–15.5)
WBC Count: 8.6 10*3/uL (ref 4.0–10.5)
nRBC: 0 % (ref 0.0–0.2)

## 2021-01-21 LAB — CMP (CANCER CENTER ONLY)
ALT: 18 U/L (ref 0–44)
AST: 12 U/L — ABNORMAL LOW (ref 15–41)
Albumin: 4.3 g/dL (ref 3.5–5.0)
Alkaline Phosphatase: 91 U/L (ref 38–126)
Anion gap: 8 (ref 5–15)
BUN: 10 mg/dL (ref 6–20)
CO2: 25 mmol/L (ref 22–32)
Calcium: 9.6 mg/dL (ref 8.9–10.3)
Chloride: 101 mmol/L (ref 98–111)
Creatinine: 0.74 mg/dL (ref 0.44–1.00)
GFR, Estimated: >60 mL/min (ref >60)
Glucose, Bld: 93 mg/dL (ref 70–99)
Potassium: 4.2 mmol/L (ref 3.5–5.1)
Sodium: 134 mmol/L — ABNORMAL LOW (ref 135–145)
Total Bilirubin: 0.6 mg/dL (ref 0.3–1.2)
Total Protein: 7.1 g/dL (ref 6.5–8.1)

## 2021-01-21 MED ORDER — INFLUENZA VAC SPLIT QUAD 0.5 ML IM SUSY
0.5000 mL | PREFILLED_SYRINGE | Freq: Once | INTRAMUSCULAR | Status: AC
  Administered 2021-01-21: 0.5 mL via INTRAMUSCULAR
  Filled 2021-01-21: qty 0.5

## 2021-01-21 NOTE — Progress Notes (Signed)
[place]   [phone] [phone]   Clinic Follow up Note   Patient Care Team: Copland, Gay Filler, MD as PCP - General (Family Medicine) Park Liter, MD as PCP - Cardiology (Cardiology) [name], MD as Referring Physician (Obstetrics and Gynecology) [name], MD as Consulting Physician (Gastroenterology) [name], MD as Referring Physician (Allergy) [name], MD as Consulting Physician (General Surgery) [name], MD as Consulting Physician (Radiation Oncology) [name], MD as Consulting Physician (Hematology) Delice Bison Charlestine Massed, NP as Nurse Practitioner (Hematology and Oncology) [name], MD as Consulting Physician (Gynecologic Oncology) Park Liter, MD as Consulting Physician (Cardiology)  Date of Service:  01/21/2021  CHIEF COMPLAINT: f/u of right breast cancer  CURRENT THERAPY:  Exemestane, started June 2019   ASSESSMENT & PLAN:  Erin Fields is a 55 y.o. female with   1. Malignant neoplasm of upper-outer quadrant of right breast, invasive lobular carcinoma, stage IA, pT2N0M0, ER/PR: Positive, HER2 negative, Grade II -She was initially diagnosed on 03/16/2017. She was treated with right lumpectomy, re-excision surgery and adjuvant radiation.  -I started her on antiestrogen therapy with Letrozole in 08/2017. Due to poor tolerance with joint pain, this was changed to Exemestane in 09/2017. She is tolerating well.  -most recent mammogram on 12/22/20 was negative. -She held exemestane from 08/13/20 due to concern for heart issues. After discussion with her cardiologist, she restarted the exemestane on 08/30/20. Her CHF is unlikely related to exemestane  -continue surveillance.  Next Mammogram in 12/2021.  -lab and f/u with NP Lacie in 6 months   2. Severe non-ischemia CHF -She notes she was diagnosed with CHF in 04/2020 (non-ischemic cardiomyopathy) with unknown etiology. Her 04/22/20 ECHO  showed EF 25%. She required cardiac cath on 06/21/20. She notes she has been symptomatic with SOB.  -She was started on several cardiac medications by Dr Agustin Cree -she underwent ICD placement on 10/01/20 but subsequently developed ventricle perforation, requiring emergent surgery the next day.   2. Genetic Testing was negative for pathogenetic mutations.    3. Bone Health  -Her 08/13/17 DEXA was normal with lowest T-score -0.5 at [facility].  -Her 11/03/19 DEXA was normal (T-score -0.6 at [facility]). Mostly stable.  -Will continue to monitor on AI as this can weaken her bone.    4. Comorbidities: Arthritis, DM, Anxiety -She has lost her husband and daughter in recent years. She is currently on Valium daily managed by Dr. 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 She had planned to have surgery before CHF diagnosis. Now she is likely not eligible.    5. COVID (+) in 03/2020 - she has recovered.      PLAN:  -continue exemestane -lab and f/u with NP Lacie in 6 months   No problem-specific Assessment & Plan notes found for this encounter.   SUMMARY OF ONCOLOGIC HISTORY: Oncology History Overview Note  Cancer Staging Malignant neoplasm of upper-outer quadrant of right breast in female, estrogen receptor positive ([place]) Staging form: Breast, AJCC 8th Edition - Clinical stage from 03/16/2017: Stage IA (cT1b, cN0, cM0, G2, ER: Positive, PR: Positive, HER2: Negative) - Signed by [name], MD on 03/28/2017 - Pathologic stage from 05/15/2017: Stage IA (pT2, pN0, cM0, G2, ER+, PR+, HER2-, Oncotype DX score: 3) - Signed by [name], MD on 08/06/2017     Malignant neoplasm of upper-outer quadrant of right breast in female, estrogen receptor positive ([place])  03/15/2017 Mammogram   Diagnostic Mammogram Right  Breast  IMPRESSION: Palpable abnormality corresponds to distortion and focal mass associated acoustic shadowing measuring 0.8x0.9x0.9cm in the 930 o'clock location of the right breast 7  cm from the nipple.    RECOMMENDATION: Ultrasound-guided core biopsy is recommended and scheduled for the patient.   03/16/2017 Initial Biopsy   Biopsy Results  Diagnosis 03/16/17 Breast, right, needle core biopsy, 9:30 o'clock, ribbon clip placed - INVASIVE MAMMARY CARCINOMA. - SEE COMMENT.    03/16/2017 Receptors her2   Estrogen Receptor: 95%, POSITIVE, STRONG STAINING INTENSITY Progesterone Receptor: 95%, POSITIVE, STRONG STAINING INTENSITY Proliferation Marker Ki67: 3% HER2: NEGATIVE   03/20/2017 Initial Diagnosis   Malignant neoplasm of upper-outer quadrant of right breast in female, estrogen receptor positive ([place])   04/24/2017 Surgery   Right Breast Lumpectomy with Sentinel Node Biopsy with Dr. [name]    04/24/2017 Pathology Results   Diagnosis 04/24/17 1. Breast, lumpectomy, Right w/seed - INVASIVE LOBULAR CARCINOMA, GRADE 2, SPANNING 2.2 CM. - LOBULAR NEOPLASIA (ATYPICAL LOBULAR HYPERPLASIA). - ANTERIOR SUPERIOR MARGIN IS BROADLY POSITIVE. - BIOPSY SITE. - SEE ONCOLOGY TABLE. 2. Breast, excision, Right additional Medial Margin - INVASIVE LOBULAR CARCINOMA. - LOBULAR CARCINOMA IN SITU. - INVASIVE CARCINOMA IS 0.3 CM FROM NEW MEDIAL MARGIN. 3. Lymph node, sentinel, biopsy, Right Axillary #1 - ISOLATED TUMOR CELLS IN ONE OF ONE LYMPH NODES (0/1). 4. Lymph node, sentinel, biopsy, Right Axillary #2 - ONE OF ONE LYMPH NODES NEGATIVE FOR CARCINOMA (0/1). 5. Lymph node, sentinel, biopsy, Right Axillary #3 - ONE OF ONE LYMPH NODES NEGATIVE FOR CARCINOMA (0/1).   04/24/2017 Oncotype testing   Her Oncotyple recurrence score was 3, her distant recurrence score with Tamoxifen was 3% and her adjuvant chemotherapy benefit was <1%.    05/15/2017 Surgery   Re-excision of Right Breast Lumpectomy with Dr. [name]    05/15/2017 Pathology Results   Diagnosis 05/15/17 Breast, excision, Right additional anterior margin - INVASIVE LOBULAR CARCINOMA. - ATYPICAL LOBULAR HYPERPLASIA. - THE  NEW ANTERIOR SURGICAL RESECTION MARGIN IS NEGATIVE FOR CARCINOMA.   05/15/2017 Cancer Staging   Staging form: Breast, AJCC 8th Edition - Pathologic stage from 05/15/2017: Stage IA (pT2, pN0, cM0, G2, ER+, PR+, HER2-, Oncotype DX score: 3) - Signed by [name], MD on 08/06/2017   06/26/2017 -  Radiation Therapy   Adjuvant RT with Dr. [name]   07/12/2017 Genetic Testing   Negative genetic testing on the multi-cancer panel.  The Multi-Gene Panel offered by [name] includes sequencing and/or deletion duplication testing of the following 83 genes: ALK, APC, ATM, AXIN2,BAP1,  BARD1, BLM, BMPR1A, BRCA1, BRCA2, BRIP1, CASR, CDC73, CDH1, CDK4, CDKN1B, CDKN1C, CDKN2A (p14ARF), CDKN2A (p16INK4a), CEBPA, CHEK2, CTNNA1, DICER1, DIS3L2, EGFR (c.2369C>T, p.Thr790Met variant only), EPCAM (Deletion/duplication testing only), FH, FLCN, GATA2, GPC3, GREM1 (Promoter region deletion/duplication testing only), HOXB13 (c.251G>A, p.Gly84Glu), HRAS, KIT, MAX, MEN1, MET, MITF (c.952G>A, p.Glu318Lys variant only), MLH1, MSH2, MSH3, MSH6, MUTYH, NBN, NF1, NF2, NTHL1, PALB2, PDGFRA, PHOX2B, PMS2, POLD1, POLE, POT1, PRKAR1A, PTCH1, PTEN, RAD50, RAD51C, RAD51D, RB1, RECQL4, RET, RUNX1, SDHAF2, SDHA (sequence changes only), SDHB, SDHC, SDHD, SMAD4, SMARCA4, SMARCB1, SMARCE1, STK11, SUFU, TERT, TERT, TMEM127, TP53, TSC1, TSC2, VHL, WRN and WT1.  The report date is July 12, 2017.    08/13/2017 Imaging   08/13/2017 DEXA ASSESSMENT: The BMD measured at [facility] L1-L4 is 1.120 g/cm2 with a T-score of -0.5. This patient is considered normal according to [facility]) criteria.    08/2017 -  Anti-estrogen oral therapy   Letrozole daily, changed to Exemestane due to poor  tolerance of Letrozole   01/16/2018 Imaging   01/16/2018 CT Chest IMPRESSION: Stable 2 mm RIGHT upper lobe nodule.   Post radiation therapy changes at the anterolateral RIGHT upper lobe.   Post RIGHT breast surgery and RIGHT axillary node  dissection.   No new abnormalities.   Aortic Atherosclerosis (ICD10-I70.0).   08/09/2018 Mammogram   IMPRESSION: 1. No mammographic evidence of malignancy involving either breast. 2. Expected post lumpectomy and post radiation changes involving the RIGHT breast.      INTERVAL HISTORY:  [name] is here for a follow up of breast cancer. She was last seen by me on 08/13/20. She presents to the clinic alone.   All other systems were reviewed with the patient and are negative.  MEDICAL HISTORY:  Past Medical History:  Diagnosis Date   Allergic rhinitis due to animal (cat) (dog) hair and dander 04/27/2020   Allergic rhinitis due to pollen 04/27/2020   Anxiety    Anxiety and depression 01/20/2018   Asthma    Asthma, persistent controlled 10/05/2018   B12 deficiency 11/27/2019   Borderline diabetes 12/17/2015   Breast cancer ([place])    Cancer ([place]) 2019   right breast cancer   CERVICAL CANCER, HX OF 12/06/2006   Qualifier: Diagnosis of  By: Arnoldo Morale MD, John E    Chronic sinusitis 01/26/2015   Chronic venous insufficiency 09/02/2015   Colon polyps    Congestive heart failure ([place]) 04/29/2020   Controlled type 2 diabetes mellitus without complication, without long-term current use of insulin ([place]) 05/28/2017   Coronary artery disease 25% of RCA, 20% intermediate branch, 40% diagonal branch based on cardiac cath from 2022 07/22/2020   Crohn disease ([place]) 11/29/2016   Depression    Diabetes mellitus without complication ([place])    type 2   Dilated cardiomyopathy ([place]) ejection fraction 25% by echocardiogram from January 2022 04/29/2020   Dyspnea 09/15/2014   Elevated triglycerides with high cholesterol 11/26/2013   Environmental allergies    Eustachian tube dysfunction 12/04/2014   Ex-smoker 11/18/2015   Family history of breast cancer    Genetic testing 07/16/2017   Negative genetic testing on the multi-cancer panel.  The Multi-Gene Panel offered by [name] includes  sequencing and/or deletion duplication testing of the following 83 genes: ALK, APC, ATM, AXIN2,BAP1,  BARD1, BLM, BMPR1A, BRCA1, BRCA2, BRIP1, CASR, CDC73, CDH1, CDK4, CDKN1B, CDKN1C, CDKN2A (p14ARF), CDKN2A (p16INK4a), CEBPA, CHEK2, CTNNA1, DICER1, DIS3L2, EGFR (c.2369C>T, p.Thr790Met variant onl   GERD (gastroesophageal reflux disease)    HEADACHE 12/06/2006   Qualifier: Diagnosis of  By: Arnoldo Morale MD, John E    Hemothorax on left 10/02/2020   History of adenomatous polyp of colon 11/19/2017   Hyperlipidemia    Hypertension    Hypothyroidism    IDA (iron deficiency anemia) 11/06/2019   Impaired fasting glucose 12/12/2017   Insomnia 08/20/2013   Irritable bowel syndrome 11/29/2006   Qualifier: Diagnosis of  By: Arnoldo Morale MD, John E    Left sided abdominal pain 12/13/2017   Liver hemangioma 08/24/2014   Malignant neoplasm of upper-outer quadrant of right breast in female, estrogen receptor positive ([place]) 03/20/2017   Migraines    Mild persistent asthma, uncomplicated 09/98/3382   Nasal polyp, unspecified 01/10/2018   Oral herpes 04/20/2013   Other allergic rhinitis 09/15/2014   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 10/02/2020   Personal history of radiation therapy 2019   36 radiation tx   PONV (postoperative nausea and vomiting)    scopolamine patch helps  Recurrent infections 10/05/2018   Right upper lobe pulmonary nodule 01/10/2018   Symptomatic varicose veins, right 09/02/2015   Ulcerative colitis ([place])    Varicose veins     SURGICAL HISTORY: Past Surgical History:  Procedure Laterality Date   BREAST BIOPSY Right 02/2017   BREAST BIOPSY Right 11/07/2019   BREAST LUMPECTOMY Right 04/24/2017   BREAST LUMPECTOMY WITH RADIOACTIVE SEED AND SENTINEL LYMPH NODE BIOPSY Right 04/24/2017   Procedure: RIGHT BREAST LUMPECTOMY WITH RADIOACTIVE SEED AND SENTINEL LYMPH NODE BIOPSY;  Surgeon: [name], MD;  Location: [place];  Service: General;   Laterality: Right;   CORONARY ARTERY BYPASS GRAFT     crapal tunnel relase Left 05/2018   ENDOMETRIAL ABLATION  2010   Had abnormal uterine bleeding (heavy and frequent), performed in [place], menses stopped completely 2 years after   EXPLORATORY LAPAROTOMY     Had exploratory surgery at the age of 54 for abdominal pain with cyst found on her left ovary, not treated surgically   FOOT SURGERY Right 2012   GENERATOR REMOVAL  10/02/2020   Procedure: GENERATOR REMOVAL;  Surgeon: Rexene Alberts, MD;  Location: [place];  Service: Thoracic;;   HAND SURGERY Right    ICD IMPLANT N/A 10/01/2020   Procedure: ICD IMPLANT;  Surgeon: Constance Haw, MD;  Location: [place] [facility];  Service: Cardiovascular;  Laterality: N/A;   ICD LEAD REMOVAL N/A 10/02/2020   Procedure: ICD LEAD REMOVAL;  Surgeon: Rexene Alberts, MD;  Location: [place];  Service: Thoracic;  Laterality: N/A;   MEDIASTERNOTOMY N/A 10/02/2020   Procedure: MEDIAN STERNOTOMY REPAIR PERFORATED VENTRICLE EVACUATION LEFT HEMOTHORAX;  Surgeon: Rexene Alberts, MD;  Location: [place];  Service: Thoracic;  Laterality: N/A;   RE-EXCISION OF BREAST LUMPECTOMY Right 05/15/2017   Procedure: RE-EXCISION OF BREAST LUMPECTOMY;  Surgeon: [name], MD;  Location: [place] [place];  Service: General;  Laterality: Right;   RIGHT/LEFT HEART CATH AND CORONARY ANGIOGRAPHY N/A 06/21/2020   Procedure: RIGHT/LEFT HEART CATH AND CORONARY ANGIOGRAPHY;  Surgeon: Nelva Bush, MD;  Location: [place] [facility];  Service: Cardiovascular;  Laterality: N/A;   ROBOTIC ASSISTED SALPINGO OOPHERECTOMY Bilateral 08/29/2019   Procedure: XI ROBOTIC ASSISTED SALPINGO OOPHORECTOMY;  Surgeon: [name], MD;  Location[facility];  Service: Gynecology;  Laterality: Bilateral;   Septoplasty with turbinate reduction     TEE WITHOUT CARDIOVERSION N/A 10/02/2020   Procedure: TRANSESOPHAGEAL ECHOCARDIOGRAM (TEE);  Surgeon: Rexene Alberts, MD;  Location: [facility];  Service: Thoracic;  Laterality: N/A;   [place]     Removal Right Leg   WISDOM TOOTH EXTRACTION      I have reviewed the social history and family history with the patient and they are unchanged from previous note.  ALLERGIES:  is allergic to losartan potassium, ace inhibitors, aspirin, cefaclor, oxycodone, sulfa antibiotics, and lisinopril.  MEDICATIONS:  Current Outpatient Medications  Medication Sig Dispense Refill   acyclovir (ZOVIRAX) 400 MG tablet TAKE 1 TABLET (400 MG TOTAL) BY MOUTH 3 (THREE) TIMES DAILY AS NEEDED. 270 tablet 1   Adalimumab 80 MG/0.8ML PNKT Inject 80 mg into the skin every 14 (fourteen) days.     albuterol (VENTOLIN HFA) 108 (90 Base) MCG/ACT inhaler Inhale 2 puffs into the lungs every 6 (six) hours as needed for shortness of breath.     ascorbic acid (VITAMIN C) 1000 MG tablet Take 1,000 mg by mouth daily.  aspirin EC 81 MG tablet Take 81 mg by mouth daily. Swallow whole.     Azelastine HCl 0.15 % SOLN Place 2 sprays into both nostrils 2 (two) times daily. For 30 days     Biotin 1000 MCG tablet Take 1,000 mcg by mouth daily.     Calcium Carb-Cholecalciferol (CALCIUM+D3) 600-800 MG-UNIT TABS Take 1 tablet by mouth daily.     carvedilol (COREG) 12.5 MG tablet TAKE 1 TABLET BY MOUTH 2 TIMES DAILY. 180 tablet 1   cetirizine (ZYRTEC) 10 MG tablet Take 10 mg by mouth daily.     dapagliflozin propanediol (FARXIGA) 10 MG TABS tablet Take 1 tablet (10 mg total) by mouth daily before breakfast. 30 tablet 11   diphenoxylate-atropine (LOMOTIL) 2.5-0.025 MG tablet Take 2 tablets by mouth 4 (four) times daily as needed for diarrhea or loose stools.     ENTRESTO 49-51 MG TAKE 1 TABLET BY MOUTH TWICE A DAY 180 tablet 3   EPINEPHrine 0.3 mg/0.3 mL IJ SOAJ injection Inject 0.3 mg into the muscle as needed for anaphylaxis.     exemestane (AROMASIN) 25 MG tablet TAKE 1 TABLET (25 MG TOTAL) BY MOUTH DAILY AFTER  BREAKFAST. 90 tablet 1   Glucosamine-Chondroit-Vit C-Mn (GLUCOSAMINE-CHONDROITIN) CAPS Take 1 capsule by mouth daily. Unknown strenght     HYDROcodone-acetaminophen (NORCO/VICODIN) 5-325 MG tablet Take 1-2 tablets by mouth every 8 (eight) hours as needed. 20 tablet 0   Hyoscyamine Sulfate 0.375 MG TBCR Take 0.375 mg by mouth in the morning and at bedtime.     lansoprazole (PREVACID SOLUTAB) 15 MG disintegrating tablet Take 15 mg by mouth 2 (two) times daily before a meal.     levothyroxine (SYNTHROID) 75 MCG tablet Take 1 tablet (75 mcg total) by mouth daily before breakfast. 30 tablet 6   MAGNESIUM PO Take 250 mg by mouth daily.     meloxicam (MOBIC) 15 MG tablet Take 15 mg by mouth daily as needed for pain (Hip pain).     Menthol, Topical Analgesic, (BIOFREEZE COLORLESS) 4 % GEL Apply 1 application topically daily as needed (Hip pain).     mesalamine (LIALDA) 1.2 g EC tablet Take 2.4 g by mouth in the morning and at bedtime.      metFORMIN (GLUCOPHAGE) 500 MG tablet Take 1 tablet (500 mg total) by mouth 2 (two) times daily with a meal. 180 tablet 3   montelukast (SINGULAIR) 10 MG tablet Take 10 mg by mouth at bedtime.      Omega 3 1000 MG CAPS Take 2,000 mg by mouth every other day.     promethazine (PHENERGAN) 25 MG tablet Take 0.5-1 tablets (12.5-25 mg total) by mouth daily as needed for nausea or vomiting. 30 tablet 5   rosuvastatin (CRESTOR) 20 MG tablet Take 1 tablet (20 mg total) by mouth daily. 90 tablet 3   spironolactone (ALDACTONE) 25 MG tablet Take 1 tablet (25 mg total) by mouth daily. 45 tablet 3   SYMBICORT 160-4.5 MCG/ACT inhaler INHALE 2 PUFFS INTO THE LUNGS TWICE A DAY 30.6 each 1   triamcinolone (NASACORT) 55 MCG/ACT AERO nasal inhaler Place 2 sprays into the nose daily.     Turmeric 500 MG CAPS Take 500 mg by mouth daily.     venlafaxine XR (EFFEXOR-XR) 150 MG 24 hr capsule TAKE 1 CAPSULE BY MOUTH DAILY WITH BREAKFAST. 90 capsule 1   zinc gluconate 50 MG tablet Take 50 mg by  mouth daily.     zolpidem (AMBIEN) 5 MG tablet  TAKE 1 TABLET BY MOUTH EVERY DAY AT BEDTIME AS NEEDED FOR SLEEP 30 tablet 2   No current facility-administered medications for this visit.    PHYSICAL EXAMINATION: ECOG PERFORMANCE STATUS: 1 - Symptomatic but completely ambulatory  Vitals:   01/21/21 1221  BP: 125/68  Pulse: 90  Resp: 18  Temp: 98.3 F (36.8 C)  SpO2: 98%   Wt Readings from Last 3 Encounters:  01/21/21 152 lb 3.2 oz (69 kg)  12/31/20 151 lb 3.2 oz (68.6 kg)  12/13/20 149 lb 3.2 oz (67.7 kg)     GENERAL:alert, no distress and comfortable SKIN: skin color, texture, turgor are normal, no rashes or significant lesions EYES: normal, Conjunctiva are pink and non-injected, sclera clear  NECK: supple, thyroid normal size, non-tender, without nodularity LYMPH:  no palpable lymphadenopathy in the cervical, axillary  LUNGS: clear to auscultation and percussion with normal breathing effort HEART: regular rate & rhythm and no murmurs and no lower extremity edema ABDOMEN:abdomen soft, non-tender and normal bowel sounds Musculoskeletal:no cyanosis of digits and no clubbing  NEURO: alert & oriented x 3 with fluent speech, no focal motor/sensory deficits BREAST: No palpable mass, nodules or adenopathy bilaterally. Breast exam benign.   LABORATORY DATA:  I have reviewed the data as listed CBC Latest Ref Rng & Units 01/21/2021 11/11/2020 10/15/2020  WBC 4.0 - 10.5 K/uL 8.6 5.6 16.9(H)  Hemoglobin 12.0 - 15.0 g/dL 14.2 13.4 12.0  Hematocrit 36.0 - 46.0 % 42.7 41.0 35.3(L)  Platelets 150 - 400 K/uL 370 445.0(H) 879.0(H)     CMP Latest Ref Rng & Units 01/21/2021 01/12/2021 12/31/2020  Glucose 70 - 99 mg/dL 93 131(H) 123(H)  BUN 6 - 20 mg/dL 10 9 7   Creatinine 0.44 - 1.00 mg/dL 0.74 0.63 0.70  Sodium 135 - 145 mmol/L 134(L) 136 139  Potassium 3.5 - 5.1 mmol/L 4.2 5.1 4.3  Chloride 98 - 111 mmol/L 101 104 105  CO2 22 - 32 mmol/L 25 25 25   Calcium 8.9 - 10.3 mg/dL 9.6 9.7 9.3   Total Protein 6.5 - 8.1 g/dL 7.1 - -  Total Bilirubin 0.3 - 1.2 mg/dL 0.6 - -  Alkaline Phos 38 - 126 U/L 91 - -  AST 15 - 41 U/L 12(L) - -  ALT 0 - 44 U/L 18 - -      RADIOGRAPHIC STUDIES: I have personally reviewed the radiological images as listed and agreed with the findings in the report. No results found.    No orders of the defined types were placed in this encounter.  All questions were answered. The patient knows to call the clinic with any problems, questions or concerns. No barriers to learning was detected. The total time spent in the appointment was 30 minutes.     [name], MD 01/21/2021   I, Wilburn Mylar, am acting as scribe for [name], MD.   I have reviewed the above documentation for accuracy and completeness, and I agree with the above.

## 2021-01-25 ENCOUNTER — Ambulatory Visit (HOSPITAL_COMMUNITY)
Admission: RE | Admit: 2021-01-25 | Discharge: 2021-01-25 | Disposition: A | Discharge status: Home / Self Care | Payer: No Typology Code available for payment source | Source: Ambulatory Visit | Attending: Cardiology | Admitting: Cardiology

## 2021-01-25 ENCOUNTER — Other Ambulatory Visit: Payer: Self-pay

## 2021-01-25 VITALS — BP 124/78 | Wt 152.8 lb

## 2021-01-25 DIAGNOSIS — Z7901 Long term (current) use of anticoagulants: Secondary | ICD-10-CM | POA: Insufficient documentation

## 2021-01-25 DIAGNOSIS — I428 Other cardiomyopathies: Secondary | ICD-10-CM | POA: Diagnosis not present

## 2021-01-25 DIAGNOSIS — I5022 Chronic systolic (congestive) heart failure: Secondary | ICD-10-CM | POA: Diagnosis not present

## 2021-01-25 DIAGNOSIS — R0600 Dyspnea, unspecified: Secondary | ICD-10-CM | POA: Diagnosis not present

## 2021-01-25 DIAGNOSIS — M169 Osteoarthritis of hip, unspecified: Secondary | ICD-10-CM | POA: Insufficient documentation

## 2021-01-25 DIAGNOSIS — Z79899 Other long term (current) drug therapy: Secondary | ICD-10-CM | POA: Diagnosis not present

## 2021-01-25 DIAGNOSIS — R0602 Shortness of breath: Secondary | ICD-10-CM | POA: Diagnosis present

## 2021-01-25 MED ORDER — CARVEDILOL 12.5 MG PO TABS: 18.7500 mg | ORAL_TABLET | Freq: Two times a day (BID) | ORAL | 3 refills | Status: DC

## 2021-01-25 NOTE — Patient Instructions (Addendum)
It was a pleasure seeing you today!  MEDICATIONS: -We are changing your medications today -Increase carvedilol to 18.75 mg (1.5 tablet) twice daily -Call if you have questions about your medications.  NEXT APPOINTMENT: Return to clinic in 1 month with Dr. Aundra Dubin.  In general, to take care of your heart failure: -Limit your fluid intake to 2 Liters (half-gallon) per day.   -Limit your salt intake to ideally 2-3 grams (2000-3000 mg) per day. -Weigh yourself daily and record, and bring that "weight diary" to your next appointment.  (Weight gain of 2-3 pounds in 1 day typically means fluid weight.) -The medications for your heart are to help your heart and help you live longer.   -Please contact us before stopping any of your heart medications.  Call the clinic at [phone] with questions or to reschedule future appointments.

## 2021-01-25 NOTE — Progress Notes (Signed)
PCP: Dr. Lorelei Pont Cardiology: Dr. Agustin Cree HF Cardiology: Dr. Aundra Dubin  HPI:   55 y.o. with history of ulcerative colitis, HTN, and breast cancer was referred by Dr. Agustin Cree for evaluation of CHF.   Patient's breast cancer was treated with surgery and radiation, no chemo.  Patient reports exertional dyspnea since 2019.  Patient has right hip OA, needs THR.  Echo was done in 04/2020 as part of pre-op workup, this showed low EF 25%.  She then had RHC/LHC in 06/2020 with mild nonobstructive CAD and low cardiac index 2.1.  Echo in 07/2020 showed EF still 20-25%.  She was referred for ICD.  St Jude ICD was placed in 05/6376 but complicated by RV perforation with hemothorax and hemorrhagic shock.  ICD was removed and RV was surgically repaired with sternotomy.  Patient's mother had CHF in her 58I, uncertain etiology.    Patient returned for followup of CHF on 12/31/2020.  Main complaint at that visit was right hip pain.  She has significant osteoarthritis and wants to have THR done.  She had dyspnea with moderate exertion, that she attributed to deconditioning due to limited exercise. Her ability to climb stairs was limited by pain but not dyspnea.  No orthopnea/PND.  No chest pain.    Today she returns to HF clinic for pharmacist medication titration. At visit with MD on 12/31/2020, spironolactone was increased to 25 mg daily. She experienced some night time coughing initially with the dose increase, but reports that it is improving. Overall she is feeling pretty good. No dizziness or lightheadedness. No fatigue beyond her baseline. No chest pain or palpitations. She endorses SOB with moderate activity, but attributes it to her asthma and deconditioning. She weighs herself every morning, was 149 lbs today. No LEE, PND or orthopnea. She checks her BP at home, SBP ranges in the 90's-110's and DBP is usually in the 60's. She tries to avoid sodium as much as possible, and eats breakfast at home, but reports eating dinner  at a restaurant every night.   HF Medications: Carvedilol 12.5 mg BID Entresto 49/51 mg BID Spironolactone 25 mg daily  Dapagliflozin 10 mg daily    Has the patient been experiencing any side effects to the medications prescribed?  Yes, reports nighttime cough with increased dose of spironolactone, but says it is improving  Does the patient have any problems obtaining medications due to transportation or finances?   No - has Geneticist, molecular of regimen: excellent Understanding of indications: good Potential of compliance: excellent Patient understands to avoid NSAIDs. Patient understands to avoid decongestants.    Pertinent Lab Values: 01/21/2021: Serum creatinine 0.74, BUN 10, Potassium 4.2, Sodium 134  Vital Signs: Weight: 152.8 lbs (last clinic weight: 152.2 lbs) Blood pressure: 124/78  Heart rate: 90   Assessment/Plan: 1. Chronic systolic CHF: Nonischemic cardiomyopathy.  EF noted to be low since 04/2020.  LHC in 06/2020 with mild nonobstructive coronary disease.  Last echo in 09/2020 with EF 30-35%.  Cause uncertain, her mother had a cardiomyopathy of uncertain etiology so cannot rule out familial cardiomyopathy.  She had treatment for breast cancer but this did not include chemotherapy.  She has been on adalimumab for ulcerative colitis, but this does not commonly cause CHF.  No drugs and rare ETOH.  Cardiac MRI performed on 01/12/2021 LVEF 35%, normal right ventricular size with borderline decreased systolic function, EF 50%. -NYHA class II symptoms, more limited by hip pain.  Not volume overloaded on exam.  - Increase  carvedilol to 18.75 mg BID - Continue Entresto 49/51 mg BID - Continue spironolactone 25 mg daily   - Continue dapagliflozin 10 mg daily  - Narrow QRS, so not CRT candidate.  - Patient not sure yet about placement again of ICD given complicated initial ICD placement.  ICD placement recommended by MD if she has significant delayed  enhancement/scar on cMRI  2. RV perforation with ICD placement: S/p sternotomy/repair in 09/2020. Replacement of ICD to be determined by MD  based on MRI results.  3. Hip OA: Patient considered likely to be an acceptable candidate in the future for THR.  She has minimal dyspnea. It is recommended that this [facility] be [facility] at [facility] with close availability of cardiology.  MD will reassess based on LV/RV function via MRI.   Follow up with MD on 03/04/2021  Parthenia Ames, PharmD PGY-1 [facility] Pharmacy Resident  Audry Riles, PharmD, BCPS, [facility], CPP Heart Failure Clinic Pharmacist [phone]

## 2021-01-27 ENCOUNTER — Encounter (HOSPITAL_COMMUNITY): Payer: Self-pay

## 2021-01-27 ENCOUNTER — Encounter: Payer: Self-pay | Admitting: Family Medicine

## 2021-01-27 ENCOUNTER — Telehealth: Payer: Self-pay | Admitting: *Deleted

## 2021-01-27 MED ORDER — HYDROCODONE-ACETAMINOPHEN 5-325 MG PO TABS: 1.0000 | ORAL_TABLET | Freq: Three times a day (TID) | ORAL | 0 refills | Status: DC | PRN

## 2021-01-27 NOTE — Telephone Encounter (Signed)
   [facility] Pre-operative Risk Assessment    Patient Name: Erin Fields  DOB: Aug 24, 1965 MRN: [id number]  HEARTCARE STAFF:  - IMPORTANT!!!!!! Under Visit Info/Reason for Call, type in Other and utilize the format Clearance MM/DD/YY or Clearance TBD. Do not use dashes or single digits. - Please review there is not already an duplicate clearance open for this procedure. - If request is for dental extraction, please clarify the # of teeth to be extracted. - If the patient is currently at the dentist's office, call Pre-Op Callback Staff (MA/nurse) to input urgent request.  - If the patient is not currently in the dentist office, please route to the Pre-Op pool.  Request for surgical clearance:  What type of surgery is being performed?  Right total hip replacement  When is this surgery scheduled? TBD  What type of clearance is required (medical clearance vs. Pharmacy clearance to hold med vs. Both)? Both  Are there any medications that need to be held prior to surgery and how long? Pt on [address]   Practice name and name of physician performing surgery? [facility]; Dr Percell Miller  What is the office phone number? [phone] [id number])   7.   What is the office fax number? [place]   Anesthesia type (None, local, MAC, general) ? Not listed   Erin Fields 01/27/2021, 3:09 PM  _________________________________________________________________   (provider comments below)

## 2021-01-27 NOTE — Telephone Encounter (Signed)
[place] 55 year old female is requesting preoperative cardiac evaluation for right total hip replacement.  She was last seen in the clinic on 12/31/2020.  Cardiac MRI was ordered at that time to evaluate for LGE/scar on cardiac MRI.  She reported minimal dyspnea at that time.  Her cardiac MRI showed: Stable LVEF at 35%, small area of delayed enhancement at the apex.  No change in therapy was recommended.  May she proceed with her arthroplasty?  Thank you for your help.  Please direct response to CV DIV preop pool.  Jossie Ng. Safari Cinque NP-C    01/27/2021, 4:24 PM [address] Office [phone] Fax [phone]

## 2021-01-28 NOTE — Telephone Encounter (Signed)
See my note:  3. Hip OA: Patient would likely be an acceptable candidate in the future for THR.  She has minimal dyspnea.  This should be done at [facility] with close availability of cardiology.

## 2021-01-28 NOTE — Telephone Encounter (Signed)
   Primary Cardiologist: Jenne Campus, MD  Chart reviewed as part of pre-operative protocol coverage. Given past medical history and time since last visit, based on ACC/AHA guidelines, Nohea Kras would be at acceptable risk for the planned procedure without further cardiovascular testing.   Her RCRI is a class II risk, 0.9% risk of major cardiac event.  I will route this recommendation to the requesting party via Epic fax function and remove from pre-op pool.  Please call with questions.  Jossie Ng. Geeta Dworkin NP-C    01/28/2021, 7:19 AM [address] Office [phone] Fax [phone]

## 2021-02-02 ENCOUNTER — Other Ambulatory Visit: Payer: Self-pay | Admitting: Family Medicine

## 2021-02-02 ENCOUNTER — Encounter: Payer: Self-pay | Admitting: Family Medicine

## 2021-02-02 DIAGNOSIS — E119 Type 2 diabetes mellitus without complications: Secondary | ICD-10-CM

## 2021-02-07 ENCOUNTER — Other Ambulatory Visit (INDEPENDENT_AMBULATORY_CARE_PROVIDER_SITE_OTHER): Payer: No Typology Code available for payment source

## 2021-02-07 ENCOUNTER — Encounter: Payer: Self-pay | Admitting: Family Medicine

## 2021-02-07 ENCOUNTER — Other Ambulatory Visit: Payer: Self-pay

## 2021-02-07 DIAGNOSIS — E034 Atrophy of thyroid (acquired): Secondary | ICD-10-CM | POA: Diagnosis not present

## 2021-02-07 DIAGNOSIS — E119 Type 2 diabetes mellitus without complications: Secondary | ICD-10-CM | POA: Diagnosis not present

## 2021-02-07 LAB — TSH: TSH: 1.51 u[IU]/mL (ref 0.35–5.50)

## 2021-02-07 LAB — HEMOGLOBIN A1C: Hgb A1c MFr Bld: 6.6 % — ABNORMAL HIGH (ref 4.6–6.5)

## 2021-02-17 NOTE — Telephone Encounter (Signed)
error 

## 2021-02-20 ENCOUNTER — Other Ambulatory Visit: Payer: Self-pay | Admitting: Family Medicine

## 2021-02-20 DIAGNOSIS — E782 Mixed hyperlipidemia: Secondary | ICD-10-CM

## 2021-02-21 ENCOUNTER — Other Ambulatory Visit: Payer: Self-pay

## 2021-02-21 ENCOUNTER — Ambulatory Visit (INDEPENDENT_AMBULATORY_CARE_PROVIDER_SITE_OTHER): Payer: No Typology Code available for payment source | Admitting: Cardiology

## 2021-02-21 ENCOUNTER — Encounter: Payer: Self-pay | Admitting: Cardiology

## 2021-02-21 VITALS — BP 92/68 | HR 77 | Ht 63.5 in | Wt 155.0 lb

## 2021-02-21 DIAGNOSIS — I428 Other cardiomyopathies: Secondary | ICD-10-CM

## 2021-02-21 NOTE — Progress Notes (Signed)
Electrophysiology Office Note   Date:  02/21/2021   [name], DOB Jan 31, 1966, MRN [id number]  PCP:  Darreld Mclean, MD  Cardiologist: Agustin Cree Primary Electrophysiologist:   Meredith Leeds, MD    Chief Complaint: CHF   History of Present Illness: Erin Fields is a 55 y.o. female who is being seen today for the evaluation of CHF at the request of Copland, Gay Filler, MD. Presenting today for electrophysiology evaluation.  He has a history significant for right-sided breast cancer, type 2 diabetes, nonobstructive coronary artery disease, hypertension, hyperlipidemia.  She presented to cardiology clinic January 2022 To have an abnormal ECG.  She had an echo that showed an ejection fraction of 25%.  She had a [place] ICD 10/01/2016.  Unfortunately, perforation and the right hemothorax.  This required median sternotomy and ICD explantation.  Today, denies symptoms of palpitations, chest pain, shortness of breath, orthopnea, PND, lower extremity edema, claudication, dizziness, presyncope, syncope, bleeding, or neurologic sequela. The patient is tolerating medications without difficulties.  She is feeling okay today.  She states that she continues to have some pain at her surgical incision sites.  She also has itching at her prior ICD site.  She is unsure as to whether or not she wants reimplantation performed.  We did discuss S ICD today.   Past Medical History:  Diagnosis Date   Allergic rhinitis due to animal (cat) (dog) hair and dander 04/27/2020   Allergic rhinitis due to pollen 04/27/2020   Anxiety    Anxiety and depression 01/20/2018   Asthma    Asthma, persistent controlled 10/05/2018   B12 deficiency 11/27/2019   Borderline diabetes 12/17/2015   Breast cancer ([place])    Cancer ([place]) 2019   right breast cancer   CERVICAL CANCER, HX OF 12/06/2006   Qualifier: Diagnosis of  By: Arnoldo Morale MD, John E    Chronic sinusitis 01/26/2015   Chronic venous  insufficiency 09/02/2015   Colon polyps    Congestive heart failure ([place]) 04/29/2020   Controlled type 2 diabetes mellitus without complication, without long-term current use of insulin ([place]) 05/28/2017   Coronary artery disease 25% of RCA, 20% intermediate branch, 40% diagonal branch based on cardiac cath from 2022 07/22/2020   Crohn disease ([place]) 11/29/2016   Depression    Diabetes mellitus without complication ([place])    type 2   Dilated cardiomyopathy ([place]) ejection fraction 25% by echocardiogram from January 2022 04/29/2020   Dyspnea 09/15/2014   Elevated triglycerides with high cholesterol 11/26/2013   Environmental allergies    Eustachian tube dysfunction 12/04/2014   Ex-smoker 11/18/2015   Family history of breast cancer    Genetic testing 07/16/2017   Negative genetic testing on the multi-cancer panel.  The Multi-Gene Panel offered by [name] includes sequencing and/or deletion duplication testing of the following 83 genes: ALK, APC, ATM, AXIN2,BAP1,  BARD1, BLM, BMPR1A, BRCA1, BRCA2, BRIP1, CASR, CDC73, CDH1, CDK4, CDKN1B, CDKN1C, CDKN2A (p14ARF), CDKN2A (p16INK4a), CEBPA, CHEK2, CTNNA1, DICER1, DIS3L2, EGFR (c.2369C>T, p.Thr790Met variant onl   GERD (gastroesophageal reflux disease)    HEADACHE 12/06/2006   Qualifier: Diagnosis of  By: Arnoldo Morale MD, John E    Hemothorax on left 10/02/2020   History of adenomatous polyp of colon 11/19/2017   Hyperlipidemia    Hypertension    Hypothyroidism    IDA (iron deficiency anemia) 11/06/2019   Impaired fasting glucose 12/12/2017   Insomnia 08/20/2013   Irritable bowel syndrome 11/29/2006   Qualifier: Diagnosis of  By: Arnoldo Morale  MD, Jenny Reichmann E    Left sided abdominal pain 12/13/2017   Liver hemangioma 08/24/2014   Malignant neoplasm of upper-outer quadrant of right breast in female, estrogen receptor positive ([place]) 03/20/2017   Migraines    Mild persistent asthma, uncomplicated 53/66/4403   Nasal polyp, unspecified 01/10/2018   Oral herpes  04/20/2013   Other allergic rhinitis 09/15/2014   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 10/02/2020   Personal history of radiation therapy 2019   36 radiation tx   PONV (postoperative nausea and vomiting)    scopolamine patch helps   Recurrent infections 10/05/2018   Right upper lobe pulmonary nodule 01/10/2018   Symptomatic varicose veins, right 09/02/2015   Ulcerative colitis ([place])    Varicose veins    Past Surgical History:  Procedure Laterality Date   BREAST BIOPSY Right 02/2017   BREAST BIOPSY Right 11/07/2019   BREAST LUMPECTOMY Right 04/24/2017   BREAST LUMPECTOMY WITH RADIOACTIVE SEED AND SENTINEL LYMPH NODE BIOPSY Right 04/24/2017   Procedure: RIGHT BREAST LUMPECTOMY WITH RADIOACTIVE SEED AND SENTINEL LYMPH NODE BIOPSY;  Surgeon: [name], MD;  Location: [facility];  Service: General;  Laterality: Right;   CORONARY ARTERY BYPASS GRAFT     crapal tunnel relase Left 05/2018   ENDOMETRIAL ABLATION  2010   Had abnormal uterine bleeding (heavy and frequent), performed in [place], menses stopped completely 2 years after   EXPLORATORY LAPAROTOMY     Had exploratory surgery at the age of 58 for abdominal pain with cyst found on her left ovary, not treated surgically   FOOT SURGERY Right 2012   GENERATOR REMOVAL  10/02/2020   Procedure: GENERATOR REMOVAL;  Surgeon: Rexene Alberts, MD;  Location: [facility];  Service: Thoracic;;   HAND SURGERY Right    ICD IMPLANT N/A 10/01/2020   Procedure: ICD IMPLANT;  Surgeon: Constance Haw, MD;  Location: [facility];  Service: Cardiovascular;  Laterality: N/A;   ICD LEAD REMOVAL N/A 10/02/2020   Procedure: ICD LEAD REMOVAL;  Surgeon: Rexene Alberts, MD;  Location: [place];  Service: Thoracic;  Laterality: N/A;   MEDIASTERNOTOMY N/A 10/02/2020   Procedure: MEDIAN STERNOTOMY REPAIR PERFORATED VENTRICLE EVACUATION LEFT HEMOTHORAX;  Surgeon: Rexene Alberts, MD;  Location: [place] [facility];  Service: Thoracic;   Laterality: N/A;   RE-EXCISION OF BREAST LUMPECTOMY Right 05/15/2017   Procedure: RE-EXCISION OF BREAST LUMPECTOMY;  Surgeon: [name], MD;  Location: [facility];  Service: General;  Laterality: Right;   RIGHT/LEFT HEART CATH AND CORONARY ANGIOGRAPHY N/A 06/21/2020   Procedure: RIGHT/LEFT HEART CATH AND CORONARY ANGIOGRAPHY;  Surgeon: Nelva Bush, MD;  Location: [facility];  Service: Cardiovascular;  Laterality: N/A;   ROBOTIC ASSISTED SALPINGO OOPHERECTOMY Bilateral 08/29/2019   Procedure: XI ROBOTIC ASSISTED SALPINGO OOPHORECTOMY;  Surgeon: [name], MD;  [facility];  Service: Gynecology;  Laterality: Bilateral;   Septoplasty with turbinate reduction     TEE WITHOUT CARDIOVERSION N/A 10/02/2020   Procedure: TRANSESOPHAGEAL ECHOCARDIOGRAM (TEE);  Surgeon: Rexene Alberts, MD;  Location: [facility] OR;  Service: Thoracic;  Laterality: N/A;   [place]     Removal Right Leg   WISDOM TOOTH EXTRACTION       Current Outpatient Medications  Medication Sig Dispense Refill   acyclovir (ZOVIRAX) 400 MG tablet TAKE 1 TABLET (400 MG TOTAL) BY MOUTH 3 (THREE) TIMES DAILY AS NEEDED. 270 tablet 1   Adalimumab 80 MG/0.8ML PNKT  Inject 80 mg into the skin every 14 (fourteen) days.     albuterol (VENTOLIN HFA) 108 (90 Base) MCG/ACT inhaler Inhale 2 puffs into the lungs every 6 (six) hours as needed for shortness of breath.     ascorbic acid (VITAMIN C) 1000 MG tablet Take 1,000 mg by mouth daily.     aspirin EC 81 MG tablet Take 81 mg by mouth daily. Swallow whole.     Azelastine HCl 0.15 % SOLN Place 2 sprays into both nostrils 2 (two) times daily. For 30 days     Biotin 1000 MCG tablet Take 1,000 mcg by mouth daily.     Calcium Carb-Cholecalciferol (CALCIUM+D3) 600-800 MG-UNIT TABS Take 1 tablet by mouth daily.     carvedilol (COREG) 12.5 MG tablet Take 1.5 tablets (18.75 mg total) by mouth 2 (two) times daily. 270  tablet 3   cetirizine (ZYRTEC) 10 MG tablet Take 10 mg by mouth daily.     dapagliflozin propanediol (FARXIGA) 10 MG TABS tablet Take 1 tablet (10 mg total) by mouth daily before breakfast. 30 tablet 11   diphenoxylate-atropine (LOMOTIL) 2.5-0.025 MG tablet Take 2 tablets by mouth 4 (four) times daily as needed for diarrhea or loose stools.     ENTRESTO 49-51 MG TAKE 1 TABLET BY MOUTH TWICE A DAY 180 tablet 3   EPINEPHrine 0.3 mg/0.3 mL IJ SOAJ injection Inject 0.3 mg into the muscle as needed for anaphylaxis.     exemestane (AROMASIN) 25 MG tablet TAKE 1 TABLET (25 MG TOTAL) BY MOUTH DAILY AFTER BREAKFAST. 90 tablet 1   Glucosamine-Chondroit-Vit C-Mn (GLUCOSAMINE-CHONDROITIN) CAPS Take 1 capsule by mouth daily. Unknown strenght     HYDROcodone-acetaminophen (NORCO/VICODIN) 5-325 MG tablet Take 1-2 tablets by mouth every 8 (eight) hours as needed. 20 tablet 0   Hyoscyamine Sulfate 0.375 MG TBCR Take 0.375 mg by mouth in the morning and at bedtime.     lansoprazole (PREVACID SOLUTAB) 15 MG disintegrating tablet Take 15 mg by mouth 2 (two) times daily before a meal.     levothyroxine (SYNTHROID) 75 MCG tablet Take 1 tablet (75 mcg total) by mouth daily before breakfast. 30 tablet 6   MAGNESIUM PO Take 250 mg by mouth daily.     meloxicam (MOBIC) 15 MG tablet Take 15 mg by mouth daily as needed for pain (Hip pain).     Menthol, Topical Analgesic, (BIOFREEZE COLORLESS) 4 % GEL Apply 1 application topically daily as needed (Hip pain).     mesalamine (LIALDA) 1.2 g EC tablet Take 2.4 g by mouth in the morning and at bedtime.      metFORMIN (GLUCOPHAGE) 500 MG tablet Take 1 tablet (500 mg total) by mouth 2 (two) times daily with a meal. 180 tablet 3   montelukast (SINGULAIR) 10 MG tablet Take 10 mg by mouth at bedtime.      Omega 3 1000 MG CAPS Take 2,000 mg by mouth every other day.     promethazine (PHENERGAN) 25 MG tablet Take 0.5-1 tablets (12.5-25 mg total) by mouth daily as needed for nausea or  vomiting. 30 tablet 5   rosuvastatin (CRESTOR) 20 MG tablet Take 1 tablet (20 mg total) by mouth daily. 90 tablet 3   spironolactone (ALDACTONE) 25 MG tablet Take 1 tablet (25 mg total) by mouth daily. 45 tablet 3   SYMBICORT 160-4.5 MCG/ACT inhaler INHALE 2 PUFFS INTO THE LUNGS TWICE A DAY 30.6 each 1   triamcinolone (NASACORT) 55 MCG/ACT AERO nasal inhaler Place 2 sprays  into the nose daily.     Turmeric 500 MG CAPS Take 500 mg by mouth daily.     venlafaxine XR (EFFEXOR-XR) 150 MG 24 hr capsule TAKE 1 CAPSULE BY MOUTH DAILY WITH BREAKFAST. 90 capsule 1   zinc gluconate 50 MG tablet Take 50 mg by mouth daily.     zolpidem (AMBIEN) 5 MG tablet TAKE 1 TABLET BY MOUTH EVERY DAY AT BEDTIME AS NEEDED FOR SLEEP 30 tablet 2   No current facility-administered medications for this visit.    Allergies:   Losartan potassium, Ace inhibitors, Aspirin, Cefaclor, Oxycodone, Sulfa antibiotics, and Lisinopril   Social History:  The patient  reports that she quit smoking about 27 years ago. Her smoking use included cigarettes. She has a 45.00 pack-year smoking history. She has never used smokeless tobacco. She reports current alcohol use of about 1.0 - 2.0 standard drink per week. She reports that she does not use drugs.   Family History:  The patient's family history includes Arthritis (age of onset: 85) in her mother; Brain cancer in her father; Breast cancer in her mother; Colitis in her sister; Crohn's disease in her mother; Diabetes in her maternal grandfather and mother; Heart attack in her maternal grandfather; Heart disease in her maternal grandfather and mother; Hyperlipidemia in her mother; Hypertension in her mother; Leukemia in her maternal grandfather; Leukemia (age of onset: 35) in her cousin; Lung cancer in her father and mother.   ROS:  Please see the history of present illness.   Otherwise, review of systems is positive for none.   All other systems are reviewed and negative.   PHYSICAL  EXAM: VS:  BP 92/68   Pulse 77   Ht 5' 3.5" (1.613 m)   Wt 155 lb (70.3 kg)   SpO2 97%   BMI 27.03 kg/m  , BMI Body mass index is 27.03 kg/m. GEN: Well nourished, well developed, in no acute distress  HEENT: normal  Neck: no JVD, carotid bruits, or masses Cardiac: RRR; no murmurs, rubs, or gallops,no edema  Respiratory:  clear to auscultation bilaterally, normal work of breathing GI: soft, nontender, nondistended, + BS MS: no deformity or atrophy  Skin: warm and dry Neuro:  Strength and sensation are intact Psych: euthymic mood, full affect  EKG:  EKG is ordered today. Personal review of the ekg ordered shows sinus rhythm, rate 77, PAC  Recent Labs: 11/11/2020: Magnesium 1.6 12/31/2020: B Natriuretic Peptide 36.4 01/21/2021: ALT 18; BUN 10; Creatinine 0.74; Hemoglobin 14.2; Platelet Count 370; Potassium 4.2; Sodium 134 02/07/2021: TSH 1.51    Lipid Panel     Component Value Date/Time   CHOL 128 08/10/2020 0936   TRIG 153 (H) 08/10/2020 0936   HDL 37 (L) 08/10/2020 0936   CHOLHDL 3.5 08/10/2020 0936   CHOLHDL 4 06/18/2019 0952   VLDL 31.8 06/18/2019 0952   LDLCALC 65 08/10/2020 0936   LDLDIRECT 101.0 12/02/2018 0955     Wt Readings from Last 3 Encounters:  02/21/21 155 lb (70.3 kg)  01/25/21 152 lb 12.8 oz (69.3 kg)  01/21/21 152 lb 3.2 oz (69 kg)      Other studies Reviewed: Additional studies/ records that were reviewed today include: TTE 07/23/20  Review of the above records today demonstrates:   1. Left ventricular ejection fraction, by estimation, is 20 to 25%. The  left ventricle has severely decreased function. The left ventricle  demonstrates global hypokinesis. The left ventricular internal cavity size  was mildly dilated. Left ventricular  diastolic parameters are indeterminate.   2. Right ventricular systolic function is normal. The right ventricular  size is normal.   3. The mitral valve is normal in structure. Mild mitral valve  regurgitation. No  evidence of mitral stenosis.   4. The aortic valve is tricuspid. Aortic valve regurgitation is not  visualized. No aortic stenosis is present.   5. The inferior vena cava is normal in size with greater than 50%  respiratory variability, suggesting right atrial pressure of 3 mmHg.   RHC/LHC 06/21/20 Mild to moderate, non-obstructive coronary artery disease. Normal left and right heart filling pressures. Mildly reduced Fick cardiac output/index.   ASSESSMENT AND PLAN:  1.  Chronic systolic heart failure due to nonischemic cardiomyopathy: Currently on carvedilol, Entresto, Aldactone.  Ejection fraction continued to be reduced.  She is now status post [place] ICD implanted 10/01/2020.  Unfortunately she had a lead perforation, hemothorax, and explant of her lead surgically with median sternotomy.  We discussed the possibility of reimplantation.  She would like to discuss this further with her primary cardiologist.  We Sherod Cisse see her back if she decides to move forward with defibrillator implant.  2.  Coronary artery disease: Nonobstructive on most recent catheterization.  No changes.  Case discussed with primary cardiology  Current medicines are reviewed at length with the patient today.   The patient does not have concerns regarding her medicines.  The following changes were made today: None  Labs/ tests ordered today include:  Orders Placed This Encounter  Procedures   EKG 12-Lead      Disposition:   FU with Phung Kotas pending evaluation with primary cardiology months  Signed, Dontarius Sheley Meredith Leeds, MD  02/21/2021 10:34 AM     [address] [place] [address] [phone] (office) [phone] (fax)

## 2021-03-03 ENCOUNTER — Other Ambulatory Visit: Payer: Self-pay | Admitting: Medical

## 2021-03-04 ENCOUNTER — Ambulatory Visit (HOSPITAL_COMMUNITY)
Admission: RE | Admit: 2021-03-04 | Discharge: 2021-03-04 | Disposition: A | Discharge status: Home / Self Care | Payer: No Typology Code available for payment source | Source: Ambulatory Visit | Attending: Cardiology | Admitting: Cardiology

## 2021-03-04 ENCOUNTER — Other Ambulatory Visit: Payer: Self-pay

## 2021-03-04 ENCOUNTER — Encounter (HOSPITAL_COMMUNITY): Payer: Self-pay | Admitting: Cardiology

## 2021-03-04 VITALS — BP 122/60 | HR 82 | Wt 159.6 lb

## 2021-03-04 DIAGNOSIS — I5022 Chronic systolic (congestive) heart failure: Secondary | ICD-10-CM | POA: Diagnosis not present

## 2021-03-04 DIAGNOSIS — Z7951 Long term (current) use of inhaled steroids: Secondary | ICD-10-CM | POA: Diagnosis not present

## 2021-03-04 DIAGNOSIS — Z79899 Other long term (current) drug therapy: Secondary | ICD-10-CM | POA: Insufficient documentation

## 2021-03-04 DIAGNOSIS — I110 Hypertensive heart disease with heart failure: Secondary | ICD-10-CM | POA: Insufficient documentation

## 2021-03-04 DIAGNOSIS — Z7982 Long term (current) use of aspirin: Secondary | ICD-10-CM | POA: Diagnosis not present

## 2021-03-04 DIAGNOSIS — I509 Heart failure, unspecified: Secondary | ICD-10-CM | POA: Diagnosis not present

## 2021-03-04 DIAGNOSIS — Z8616 Personal history of COVID-19: Secondary | ICD-10-CM | POA: Diagnosis not present

## 2021-03-04 DIAGNOSIS — I428 Other cardiomyopathies: Secondary | ICD-10-CM | POA: Diagnosis not present

## 2021-03-04 LAB — BASIC METABOLIC PANEL
Anion gap: 7 (ref 5–15)
BUN: 11 mg/dL (ref 6–20)
CO2: 23 mmol/L (ref 22–32)
Calcium: 9.4 mg/dL (ref 8.9–10.3)
Chloride: 99 mmol/L (ref 98–111)
Creatinine, Ser: 0.65 mg/dL (ref 0.44–1.00)
GFR, Estimated: >60 mL/min (ref >60)
Glucose, Bld: 100 mg/dL — ABNORMAL HIGH (ref 70–99)
Potassium: 4.5 mmol/L (ref 3.5–5.1)
Sodium: 129 mmol/L — ABNORMAL LOW (ref 135–145)

## 2021-03-04 LAB — BRAIN NATRIURETIC PEPTIDE: B Natriuretic Peptide: 18.2 pg/mL (ref 0.0–100.0)

## 2021-03-04 MED ORDER — ENTRESTO 97-103 MG PO TABS: 1.0000 | ORAL_TABLET | Freq: Two times a day (BID) | ORAL | 4 refills | Status: DC

## 2021-03-04 NOTE — Patient Instructions (Signed)
Increase Entresto to 97/103 mg (1 tablet) Twice daily  Labs done today, your results will be available in MyChart, we will contact you for abnormal readings.  Follow up lab work in 10 days  Your physician has requested that you have an echocardiogram. Echocardiography is a painless test that uses sound waves to create images of your heart. It provides your doctor with information about the size and shape of your heart and how well your heart's chambers and valves are working. This procedure takes approximately one hour. There are no restrictions for this procedure.  Please follow up with our heart failure pharmacist in 1 month  for medication titration  Your physician recommends that you schedule a follow-up appointment in: 3 months with echocardiogram  If you have any questions or concerns before your next appointment please send [place] a message through [place] or call our office at [phone].    TO LEAVE A MESSAGE FOR THE NURSE SELECT OPTION 2, PLEASE LEAVE A MESSAGE INCLUDING: YOUR NAME DATE OF BIRTH CALL BACK NUMBER REASON FOR CALL**this is important as we prioritize the call backs  YOU WILL RECEIVE A CALL BACK THE SAME DAY AS LONG AS YOU CALL BEFORE 4:00 PM  At the [facility], you and your health needs are our priority. As part of our continuing mission to provide you with exceptional heart care, we have created designated Provider Care Teams. These Care Teams include your primary Cardiologist (physician) and Advanced Practice Providers (APPs- Physician Assistants and Nurse Practitioners) who all work together to provide you with the care you need, when you need it.   You may see any of the following providers on your designated Care Team at your next follow up: Dr Glori Bickers Dr Haynes Kerns, NP Lyda Jester, [place] [facility] [place], [place] Audry Riles, PharmD   Please be sure to bring in all your medications bottles to every  appointment.

## 2021-03-06 NOTE — Progress Notes (Signed)
PCP: Dr. Lorelei Pont Cardiology: Dr. Agustin Cree  55 y.o. with history of ulcerative colitis, HTN, and breast cancer was referred by Dr. Agustin Cree for evaluation of CHF.   Patient's breast cancer was treated with surgery and radiation, no chemo.  Patient reports exertional dyspnea since 2019.  Patient has right hip OA, needs THR.  Echo was done in 1/22 as part of pre-op workup, this showed low EF 25%.  She then had RHC/LHC in 3/22 with mild nonobstructive CAD and low cardiac index 2.1.  Echo in 4/22 showed EF still 20-25%.  She was referred for ICD.  St Jude ICD was placed in 5/99 but complicated by RV perforation with hemothorax and hemorrhagic shock.  ICD was removed and RV was surgically repaired with sternotomy.  Patient's mother had CHF in her 35T, uncertain etiology.   Cardiac MRI in 9/22 showed EF 35% with moderate LV hypokinesis, RV EF 44%, small area of LGE at the true apex (?small embolic apical infarct).    Patient returns for followup of CHF.  Main complaint continues to be right hip pain.  She has significant osteoarthritis and wants to have THR done.  No dyspnea walking on flat ground.  Not doing much strenuous activity due to high pain.  She has asthma with occasional coughing/wheezing/dyspnea, but not currently active.  No orthopnea/PND.  No palpitations or chest pain.    Labs (7/22): K 4.2, creatinine 0.58 Labs (8/22): K 4.3, creatinine 0.52, BNP 30 Labs (10/22): K 4.2, creatinine 0.76  PMH: 1.  Cervical cancer 2.  B12 deficiency 3.  Asthma 4.  Type 2 diabetes 5.  Ulcerative colitis 6.  HTN 7.  Hypothyroidism 8.  Irritable bowel syndrome 9.  Breast cancer: Right-sided, surgery and radiation but no chemotherapy.  10.  Chronic systolic CHF: Nonischemic cardiomyopathy.  - Echo (1/22): EF 25% - LHC/RHC (3/22): Mild nonobstructive CAD; mean RA 3, PA 24/11, CI 2.1.  - Echo (4/22): EF 20-25%, mild LV dilation, normal RV.  - Echo (6/22): EF 30-35%  - St Jude ICD placed 0/17,  complicated by RV perforation requiring sternotomy due to hemothorax/hemorrhagic shock, removal of ICD and surgical repair of RV perforation.  - Cardiac MRI (9/22): EF 35% with moderate LV hypokinesis, RV EF 44%, small area of LGE at the true apex (?small embolic apical infarct).   11.  Right hip OA: Needs THR.  12.  COVID-19 12/21  FH: Grandfather with MI, mother with CHF in her 8s.    SH: Lives in [place], nonsmoker, rare EtoH.   ROS: All systems reviewed and negative except as per HPI.  Current Outpatient Medications  Medication Sig Dispense Refill   acyclovir (ZOVIRAX) 400 MG tablet TAKE 1 TABLET (400 MG TOTAL) BY MOUTH 3 (THREE) TIMES DAILY AS NEEDED. 270 tablet 1   Adalimumab 80 MG/0.8ML PNKT Inject 80 mg into the skin every 14 (fourteen) days.     albuterol (VENTOLIN HFA) 108 (90 Base) MCG/ACT inhaler Inhale 2 puffs into the lungs every 6 (six) hours as needed for shortness of breath.     ascorbic acid (VITAMIN C) 1000 MG tablet Take 1,000 mg by mouth daily.     aspirin EC 81 MG tablet Take 81 mg by mouth daily. Swallow whole.     Azelastine HCl 0.15 % SOLN Place 2 sprays into both nostrils 2 (two) times daily. For 30 days     Calcium Carb-Cholecalciferol (CALCIUM+D3) 600-800 MG-UNIT TABS Take 1 tablet by mouth daily.     carvedilol (COREG)  12.5 MG tablet Take 1.5 tablets (18.75 mg total) by mouth 2 (two) times daily. 270 tablet 3   cetirizine (ZYRTEC) 10 MG tablet Take 10 mg by mouth daily.     dapagliflozin propanediol (FARXIGA) 10 MG TABS tablet Take 1 tablet (10 mg total) by mouth daily before breakfast. 30 tablet 11   diphenoxylate-atropine (LOMOTIL) 2.5-0.025 MG tablet Take 2 tablets by mouth 4 (four) times daily as needed for diarrhea or loose stools.     EPINEPHrine 0.3 mg/0.3 mL IJ SOAJ injection Inject 0.3 mg into the muscle as needed for anaphylaxis.     exemestane (AROMASIN) 25 MG tablet TAKE 1 TABLET (25 MG TOTAL) BY MOUTH DAILY AFTER BREAKFAST. 90 tablet 1    HYDROcodone-acetaminophen (NORCO/VICODIN) 5-325 MG tablet Take 1-2 tablets by mouth every 8 (eight) hours as needed. 20 tablet 0   Hyoscyamine Sulfate 0.375 MG TBCR Take 0.375 mg by mouth in the morning and at bedtime.     lansoprazole (PREVACID SOLUTAB) 15 MG disintegrating tablet Take 15 mg by mouth 2 (two) times daily before a meal.     levothyroxine (SYNTHROID) 75 MCG tablet Take 1 tablet (75 mcg total) by mouth daily before breakfast. 30 tablet 6   MAGNESIUM PO Take 250 mg by mouth daily.     meloxicam (MOBIC) 15 MG tablet Take 15 mg by mouth daily as needed for pain (Hip pain).     Menthol, Topical Analgesic, (BIOFREEZE COLORLESS) 4 % GEL Apply 1 application topically daily as needed (Hip pain).     mesalamine (LIALDA) 1.2 g EC tablet Take 2.4 g by mouth in the morning and at bedtime.      metFORMIN (GLUCOPHAGE) 500 MG tablet Take 1 tablet (500 mg total) by mouth 2 (two) times daily with a meal. 180 tablet 3   montelukast (SINGULAIR) 10 MG tablet Take 10 mg by mouth at bedtime.      Omega 3 1000 MG CAPS Take 2,000 mg by mouth every other day.     promethazine (PHENERGAN) 25 MG tablet Take 0.5-1 tablets (12.5-25 mg total) by mouth daily as needed for nausea or vomiting. 30 tablet 5   rosuvastatin (CRESTOR) 20 MG tablet TAKE 1 TABLET BY MOUTH EVERY DAY 90 tablet 1   sacubitril-valsartan (ENTRESTO) 97-103 MG Take 1 tablet by mouth 2 (two) times daily. 60 tablet 4   spironolactone (ALDACTONE) 25 MG tablet Take 1 tablet (25 mg total) by mouth daily. 45 tablet 3   SYMBICORT 160-4.5 MCG/ACT inhaler INHALE 2 PUFFS INTO THE LUNGS TWICE A DAY 30.6 each 1   triamcinolone (NASACORT) 55 MCG/ACT AERO nasal inhaler Place 2 sprays into the nose daily.     venlafaxine XR (EFFEXOR-XR) 150 MG 24 hr capsule TAKE 1 CAPSULE BY MOUTH DAILY WITH BREAKFAST. 90 capsule 1   zinc gluconate 50 MG tablet Take 50 mg by mouth daily.     zolpidem (AMBIEN) 5 MG tablet TAKE 1 TABLET BY MOUTH EVERY DAY AT BEDTIME AS NEEDED FOR  SLEEP 30 tablet 2   No current facility-administered medications for this encounter.   General: NAD Neck: No JVD, no thyromegaly or thyroid nodule.  Lungs: Clear to auscultation bilaterally with normal respiratory effort. CV: Nondisplaced PMI.  Heart regular S1/S2, no S3/S4, no murmur.  No peripheral edema.  No carotid bruit.  Normal pedal pulses.  Abdomen: Soft, nontender, no hepatosplenomegaly, no distention.  Skin: Intact without lesions or rashes.  Neurologic: Alert and oriented x 3.  Psych: Normal affect. Extremities:  No clubbing or cyanosis.  HEENT: Normal.   Assessment/Plan: 1. Chronic systolic CHF: Nonischemic cardiomyopathy.  EF noted to be low since 1/22.  LHC in 3/22 with mild nonobstructive coronary disease.  Last echo in 6/22 with EF 30-35%.  Cardic MRI in 9/22 showed EF 35% with moderate LV hypokinesis, RV EF 44%, small area of LGE at the true apex (?small embolic apical infarct).  Cause uncertain, her mother had a cardiomyopathy of uncertain etiology so cannot rule out familial cardiomyopathy.  She had treatment for breast cancer but this did not include chemotherapy.  She has been on adalimumab for ulcerative colitis, but this does not commonly cause CHF.  No drugs and rare ETOH.  The small area of apical LGE does not explain the cardiomyopathy.  NYHA class II symptoms, more limited by hip pain.  Not volume overloaded on exam.  - Increase Entresto to 97/103 bid. BMET/BNP today and in 10 days.  - Continue Coreg 18.75 mg bid.  - Continue dapagliflozin 10 mg daily.  - Continue spironolactone 25 mg daily.   - Narrow QRS, so not CRT candidate.  - Patient is not sure yet about placement again of ICD give complicated initial ICD placement.  Subcutaneous ICD would be an option. However, EF up to 35% on cardiac MRI.  Repeat echo in 3 months to look for further improvement.  2. RV perforation with ICD placement: S/p sternotomy/repair in 6/22. As above, will decide on replacement of ICD  based on what next echo shows.  3. Hip OA: Patient will would likely be an acceptable candidate in the future for THR.  She has minimal dyspnea.  This should be done at [facility] with close availability of cardiology.  Plan currently for next summer.   Followup with clinical pharmacist in 4 wks for med titration (increase Coreg), see me in 3 months with echo.    Erin Fields 03/06/2021

## 2021-03-06 NOTE — Progress Notes (Deleted)
[place] at [facility] [address] [place], [place] [address] [phone]  Date:  03/09/2021   Name:  Erin Fields   DOB:  1966-03-14   MRN:  [id number]  PCP:  Darreld Mclean, MD    Chief Complaint: No chief complaint on file.   History of Present Illness:  Erin Fields is a 55 y.o. very pleasant female patient who presents with the following:  Pt seen today for a periodic follow-up visit  Last seen by myself in July: history significant for right-sided breast cancer, type 2 diabetes, nonobstructive coronary artery disease, hypertension, hyperlipidemia, end-stage hip arthritis and recent heart surgery as below   In June, she was preparing to have an ICD implantation to treat dilated cardiomyopathy with a EF of 25% noted on preoperative work-up.  Her ICD was placed on 6/17.  She had complications from her ICD implantation resulting in perforation of the right ventricle.  She was readmitted on 6/18 and underwent emergency surgery per Dr. Roxy Manns in which her perforated right ventricle and laceration of the left fifth intercostal artery were repaired.    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 10/02/2020   Hypovolemic shock ([place]) 10/02/2020   Anxiety    Coronary artery disease 25% of RCA, 20% intermediate branch, 40% diagonal branch based on cardiac cath from 2022 07/22/2020   Dilated cardiomyopathy ([place]) ejection fraction 25% by echocardiogram from January 2022 04/29/2020   Congestive heart failure ([place]) 04/29/2020   Allergic rhinitis due to animal (cat) (dog) hair and dander  04/27/2020   Allergic rhinitis due to pollen 04/27/2020   Mild persistent asthma, uncomplicated 41/58/3094   PONV (postoperative nausea and vomiting)    Migraines    GERD (gastroesophageal reflux disease)    Environmental allergies    Diabetes mellitus without complication ([place])    Depression    Colon polyps    B12 deficiency 11/27/2019   IDA (iron deficiency anemia) 11/06/2019   Asthma, persistent controlled 10/05/2018   Recurrent infections 10/05/2018   Anxiety and depression 01/20/2018   Right upper lobe pulmonary nodule 01/10/2018   Nasal polyp, unspecified 01/10/2018   Left sided abdominal pain 12/13/2017   Impaired fasting glucose 12/12/2017   History of adenomatous polyp of colon 11/19/2017   Genetic testing 07/16/2017   Family history of breast cancer    Controlled type 2 diabetes mellitus without complication, without long-term current use of insulin ([place]) 05/28/2017   Personal history of radiation therapy 2019   Cancer ([place]) 2019   Malignant neoplasm of upper-outer quadrant of right breast in female, estrogen receptor positive ([place]) 03/20/2017   Crohn disease ([place]) 11/29/2016   Borderline diabetes 12/17/2015   Ex-smoker 11/18/2015   Chronic venous insufficiency 09/02/2015   Symptomatic varicose veins, right 09/02/2015   Chronic sinusitis 01/26/2015   Eustachian tube dysfunction 12/04/2014   Other allergic rhinitis 09/15/2014   Dyspnea 09/15/2014   Liver hemangioma 08/24/2014   Elevated triglycerides with high cholesterol 11/26/2013   Insomnia 08/20/2013   Hypertension 08/20/2013   Ulcerative  colitis ([place]) 04/20/2013   Oral herpes 04/20/2013   Hyperlipidemia 12/06/2006   HEADACHE 12/06/2006   CERVICAL CANCER, HX OF 12/06/2006   Hypothyroidism 11/29/2006   Asthma 11/29/2006   IRRITABLE BOWEL SYNDROME 11/29/2006    Past Medical History:  Diagnosis Date   Allergic rhinitis due to animal (cat) (dog) hair and dander 04/27/2020   Allergic rhinitis due to pollen  04/27/2020   Anxiety    Anxiety and depression 01/20/2018   Asthma    Asthma, persistent controlled 10/05/2018   B12 deficiency 11/27/2019   Borderline diabetes 12/17/2015   Breast cancer ([place])    Cancer ([place]) 2019   right breast cancer   CERVICAL CANCER, HX OF 12/06/2006   Qualifier: Diagnosis of  By: Arnoldo Morale MD, John E    Chronic sinusitis 01/26/2015   Chronic venous insufficiency 09/02/2015   Colon polyps    Congestive heart failure ([place]) 04/29/2020   Controlled type 2 diabetes mellitus without complication, without long-term current use of insulin ([place]) 05/28/2017   Coronary artery disease 25% of RCA, 20% intermediate branch, 40% diagonal branch based on cardiac cath from 2022 07/22/2020   Crohn disease ([place]) 11/29/2016   Depression    Diabetes mellitus without complication ()    type 2   Dilated cardiomyopathy ([place]) ejection fraction 25% by echocardiogram from January 2022 04/29/2020   Dyspnea 09/15/2014   Elevated triglycerides with high cholesterol 11/26/2013   Environmental allergies    Eustachian tube dysfunction 12/04/2014   Ex-smoker 11/18/2015   Family history of breast cancer    Genetic testing 07/16/2017   Negative genetic testing on the multi-cancer panel.  The Multi-Gene Panel offered by [name] includes sequencing and/or deletion duplication testing of the following 83 genes: ALK, APC, ATM, AXIN2,BAP1,  BARD1, BLM, BMPR1A, BRCA1, BRCA2, BRIP1, CASR, CDC73, CDH1, CDK4, CDKN1B, CDKN1C, CDKN2A (p14ARF), CDKN2A (p16INK4a), CEBPA, CHEK2, CTNNA1, DICER1, DIS3L2, EGFR (c.2369C>T, p.Thr790Met variant onl   GERD (gastroesophageal reflux disease)    HEADACHE 12/06/2006   Qualifier: Diagnosis of  By: Arnoldo Morale MD, John E    Hemothorax on left 10/02/2020   History of adenomatous polyp of colon 11/19/2017   Hyperlipidemia    Hypertension    Hypothyroidism    IDA (iron deficiency anemia) 11/06/2019   Impaired fasting glucose 12/12/2017   Insomnia 08/20/2013   Irritable  bowel syndrome 11/29/2006   Qualifier: Diagnosis of  By: Arnoldo Morale MD, John E    Left sided abdominal pain 12/13/2017   Liver hemangioma 08/24/2014   Malignant neoplasm of upper-outer quadrant of right breast in female, estrogen receptor positive ([place]) 03/20/2017   Migraines    Mild persistent asthma, uncomplicated 10/08/7626   Nasal polyp, unspecified 01/10/2018   Oral herpes 04/20/2013   Other allergic rhinitis 09/15/2014   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 10/02/2020   Personal history of radiation therapy 2019   36 radiation tx   PONV (postoperative nausea and vomiting)    scopolamine patch helps   Recurrent infections 10/05/2018   Right upper lobe pulmonary nodule 01/10/2018   Symptomatic varicose veins, right 09/02/2015   Ulcerative colitis ([place])    Varicose veins     Past Surgical History:  Procedure Laterality Date   BREAST BIOPSY Right 02/2017   BREAST BIOPSY Right 11/07/2019   BREAST LUMPECTOMY Right 04/24/2017   BREAST LUMPECTOMY WITH RADIOACTIVE SEED AND SENTINEL LYMPH NODE BIOPSY Right 04/24/2017   Procedure: RIGHT BREAST LUMPECTOMY WITH RADIOACTIVE SEED AND SENTINEL LYMPH NODE BIOPSY;  Surgeon: [name], MD;  Location: [facility];  Service: General;  Laterality: Right;   CORONARY ARTERY BYPASS GRAFT     crapal tunnel relase Left 05/2018   ENDOMETRIAL ABLATION  2010   Had abnormal uterine bleeding (heavy and frequent), performed in [place], menses stopped completely 2 years after   EXPLORATORY LAPAROTOMY     Had exploratory surgery at the age of 56 for abdominal pain with cyst found on her left ovary, not treated surgically   FOOT SURGERY Right 2012   GENERATOR REMOVAL  10/02/2020   Procedure: GENERATOR REMOVAL;  Surgeon: Rexene Alberts, MD;  Location: [place];  Service: Thoracic;;   HAND SURGERY Right    ICD IMPLANT N/A 10/01/2020   Procedure: ICD IMPLANT;  Surgeon: Constance Haw, MD;  Location: [facility];   Service: Cardiovascular;  Laterality: N/A;   ICD LEAD REMOVAL N/A 10/02/2020   Procedure: ICD LEAD REMOVAL;  Surgeon: Rexene Alberts, MD;  Location: [facility];  Service: Thoracic;  Laterality: N/A;   MEDIASTERNOTOMY N/A 10/02/2020   Procedure: MEDIAN STERNOTOMY REPAIR PERFORATED VENTRICLE EVACUATION LEFT HEMOTHORAX;  Surgeon: Rexene Alberts, MD;  Location: [facility];  Service: Thoracic;  Laterality: N/A;   RE-EXCISION OF BREAST LUMPECTOMY Right 05/15/2017   Procedure: RE-EXCISION OF BREAST LUMPECTOMY;  Surgeon: [name], MD;  Location: [facility];  Service: General;  Laterality: Right;   RIGHT/LEFT HEART CATH AND CORONARY ANGIOGRAPHY N/A 06/21/2020   Procedure: RIGHT/LEFT HEART CATH AND CORONARY ANGIOGRAPHY;  Surgeon: Nelva Bush, MD;  Location: [facility];  Service: Cardiovascular;  Laterality: N/A;   ROBOTIC ASSISTED SALPINGO OOPHERECTOMY Bilateral 08/29/2019   Procedure: XI ROBOTIC ASSISTED SALPINGO OOPHORECTOMY;  Surgeon: [name], MD;  Location: [facility];  Service: Gynecology;  Laterality: Bilateral;   Septoplasty with turbinate reduction     TEE WITHOUT CARDIOVERSION N/A 10/02/2020   Procedure: TRANSESOPHAGEAL ECHOCARDIOGRAM (TEE);  Surgeon: Rexene Alberts, MD;  Location: [facility];  Service: Thoracic;  Laterality: N/A;   TONSILLECTOMY  1980   VEIN SURGERY     Removal Right Leg   WISDOM TOOTH EXTRACTION      Social History   Tobacco Use   Smoking status: Former    Packs/day: 3.00    Years: 15.00    Pack years: 45.00    Types: Cigarettes    Quit date: 09/14/1993    Years since quitting: 27.4   Smokeless tobacco: Never   Tobacco comments:    Quit 26 yrs ago  Vaping Use   Vaping Use: Never used  Substance Use Topics   Alcohol use: Yes    Alcohol/week: 1.0 - 2.0 standard drink    Types: 1 - 2 Glasses of wine per week    Comment: Drinks a couple every 2-3 months   Drug use: No    Family History  Problem Relation Age  of Onset   Arthritis Mother 80       Deceased   Breast cancer Mother    Lung cancer Mother    Hyperlipidemia Mother    Heart disease Mother    Hypertension Mother    Diabetes Mother    Crohn's disease Mother    Diabetes Maternal Grandfather    Heart disease Maternal Grandfather    Heart attack Maternal Grandfather    Leukemia Maternal Grandfather    Colitis Sister    Leukemia Cousin 3       mat cousin   Lung cancer Father  Brain cancer Father     Allergies  Allergen Reactions   Losartan Potassium Shortness Of Breath and Other (See Comments)   Ace Inhibitors Cough   Aspirin Other (See Comments)    Upset stomach   Cefaclor Hives and Other (See Comments)   Oxycodone Hives   Sulfa Antibiotics Hives   Lisinopril Cough and Other (See Comments)    Medication list has been reviewed and updated.  Current Outpatient Medications on File Prior to Visit  Medication Sig Dispense Refill   acyclovir (ZOVIRAX) 400 MG tablet TAKE 1 TABLET (400 MG TOTAL) BY MOUTH 3 (THREE) TIMES DAILY AS NEEDED. 270 tablet 1   Adalimumab 80 MG/0.8ML PNKT Inject 80 mg into the skin every 14 (fourteen) days.     albuterol (VENTOLIN HFA) 108 (90 Base) MCG/ACT inhaler Inhale 2 puffs into the lungs every 6 (six) hours as needed for shortness of breath.     ascorbic acid (VITAMIN C) 1000 MG tablet Take 1,000 mg by mouth daily.     aspirin EC 81 MG tablet Take 81 mg by mouth daily. Swallow whole.     Azelastine HCl 0.15 % SOLN Place 2 sprays into both nostrils 2 (two) times daily. For 30 days     Calcium Carb-Cholecalciferol (CALCIUM+D3) 600-800 MG-UNIT TABS Take 1 tablet by mouth daily.     carvedilol (COREG) 12.5 MG tablet Take 1.5 tablets (18.75 mg total) by mouth 2 (two) times daily. 270 tablet 3   cetirizine (ZYRTEC) 10 MG tablet Take 10 mg by mouth daily.     dapagliflozin propanediol (FARXIGA) 10 MG TABS tablet Take 1 tablet (10 mg total) by mouth daily before breakfast. 30 tablet 11    diphenoxylate-atropine (LOMOTIL) 2.5-0.025 MG tablet Take 2 tablets by mouth 4 (four) times daily as needed for diarrhea or loose stools.     EPINEPHrine 0.3 mg/0.3 mL IJ SOAJ injection Inject 0.3 mg into the muscle as needed for anaphylaxis.     exemestane (AROMASIN) 25 MG tablet TAKE 1 TABLET (25 MG TOTAL) BY MOUTH DAILY AFTER BREAKFAST. 90 tablet 1   HYDROcodone-acetaminophen (NORCO/VICODIN) 5-325 MG tablet Take 1-2 tablets by mouth every 8 (eight) hours as needed. 20 tablet 0   Hyoscyamine Sulfate 0.375 MG TBCR Take 0.375 mg by mouth in the morning and at bedtime.     lansoprazole (PREVACID SOLUTAB) 15 MG disintegrating tablet Take 15 mg by mouth 2 (two) times daily before a meal.     levothyroxine (SYNTHROID) 75 MCG tablet Take 1 tablet (75 mcg total) by mouth daily before breakfast. 30 tablet 6   MAGNESIUM PO Take 250 mg by mouth daily.     meloxicam (MOBIC) 15 MG tablet Take 15 mg by mouth daily as needed for pain (Hip pain).     Menthol, Topical Analgesic, (BIOFREEZE COLORLESS) 4 % GEL Apply 1 application topically daily as needed (Hip pain).     mesalamine (LIALDA) 1.2 g EC tablet Take 2.4 g by mouth in the morning and at bedtime.      metFORMIN (GLUCOPHAGE) 500 MG tablet Take 1 tablet (500 mg total) by mouth 2 (two) times daily with a meal. 180 tablet 3   montelukast (SINGULAIR) 10 MG tablet Take 10 mg by mouth at bedtime.      Omega 3 1000 MG CAPS Take 2,000 mg by mouth every other day.     promethazine (PHENERGAN) 25 MG tablet Take 0.5-1 tablets (12.5-25 mg total) by mouth daily as needed for nausea or vomiting. [place]  tablet 5   rosuvastatin (CRESTOR) 20 MG tablet TAKE 1 TABLET BY MOUTH EVERY DAY 90 tablet 1   sacubitril-valsartan (ENTRESTO) 97-103 MG Take 1 tablet by mouth 2 (two) times daily. 60 tablet 4   spironolactone (ALDACTONE) 25 MG tablet Take 1 tablet (25 mg total) by mouth daily. 45 tablet 3   SYMBICORT 160-4.5 MCG/ACT inhaler INHALE 2 PUFFS INTO THE LUNGS TWICE A DAY 30.6 each  1   triamcinolone (NASACORT) 55 MCG/ACT AERO nasal inhaler Place 2 sprays into the nose daily.     venlafaxine XR (EFFEXOR-XR) 150 MG 24 hr capsule TAKE 1 CAPSULE BY MOUTH DAILY WITH BREAKFAST. 90 capsule 1   zinc gluconate 50 MG tablet Take 50 mg by mouth daily.     zolpidem (AMBIEN) 5 MG tablet TAKE 1 TABLET BY MOUTH EVERY DAY AT BEDTIME AS NEEDED FOR SLEEP 30 tablet 2   No current facility-administered medications on file prior to visit.    Review of Systems:  As per HPI- otherwise negative.   Physical Examination: There were no vitals filed for this visit. There were no vitals filed for this visit. There is no height or weight on file to calculate BMI. Ideal Body Weight:    GEN: no acute distress. HEENT: Atraumatic, Normocephalic.  Ears and Nose: No external deformity. CV: RRR, No M/G/R. No JVD. No thrill. No extra heart sounds. PULM: CTA B, no wheezes, crackles, rhonchi. No retractions. No resp. distress. No accessory muscle use. ABD: S, NT, ND, +BS. No rebound. No HSM. EXTR: No c/c/e PSYCH: Normally interactive. Conversant.    Assessment and Plan: ***  Signed Lamar Blinks, MD

## 2021-03-07 ENCOUNTER — Ambulatory Visit: Payer: No Typology Code available for payment source | Admitting: Family Medicine

## 2021-03-09 ENCOUNTER — Encounter (HOSPITAL_COMMUNITY): Payer: Self-pay | Admitting: Cardiology

## 2021-03-09 ENCOUNTER — Ambulatory Visit: Payer: No Typology Code available for payment source | Admitting: Family Medicine

## 2021-03-09 DIAGNOSIS — E890 Postprocedural hypothyroidism: Secondary | ICD-10-CM

## 2021-03-09 DIAGNOSIS — I420 Dilated cardiomyopathy: Secondary | ICD-10-CM

## 2021-03-09 DIAGNOSIS — I10 Essential (primary) hypertension: Secondary | ICD-10-CM

## 2021-03-09 DIAGNOSIS — E119 Type 2 diabetes mellitus without complications: Secondary | ICD-10-CM

## 2021-03-09 NOTE — Progress Notes (Signed)
[facility] at [employer] [place], [place], [place] [address] [phone]  Date:  03/16/2021   Name:  Erin Fields   DOB:  08-09-1965   MRN:  [id number]  PCP:  Darreld Mclean, MD    Chief Complaint: 4 month follow up (Concerns/ questions: pt asks if her immunizations are UTD/Flu shot: received already)   History of Present Illness:  Erin Fields is a 55 y.o. very pleasant female patient who presents with the following:  Short term follow-up today Last seen by myself in July of this year - history significant for right-sided breast cancer, type 2 diabetes, nonobstructive coronary artery disease, hypertension, hyperlipidemia, end-stage hip arthritis and recent heart surgery as below   In June, she was preparing to have an ICD implantation to treat dilated cardiomyopathy with a EF of 25% noted on preoperative work-up.  Her ICD was placed on 6/17.  She had complications from her ICD implantation resulting in perforation of the right ventricle.  She was readmitted on 6/18 and underwent emergency surgery per Dr. Roxy Manns in which her perforated right ventricle and laceration of the left fifth intercostal artery were repaired.    Seen by Dr Aundra Dubin with CHF clinic on 11/18: Assessment/Plan: 1. Chronic systolic CHF: Nonischemic cardiomyopathy.  EF noted to be low since 1/22.  LHC in 3/22 with mild nonobstructive coronary disease.  Last echo in 6/22 with EF 30-35%.  Cardic MRI in 9/22 showed EF 35% with moderate LV hypokinesis, RV EF 44%, small area of LGE at the true apex (?small embolic apical infarct).  Cause uncertain, her mother had a cardiomyopathy of uncertain etiology so cannot rule out familial cardiomyopathy.  She had treatment for breast cancer but this did not include chemotherapy.  She has been on adalimumab for ulcerative colitis, but this does not commonly cause CHF.  No drugs and rare ETOH.  The small area of apical LGE  does not explain the cardiomyopathy.  NYHA class II symptoms, more limited by hip pain.  Not volume overloaded on exam.  - Increase Entresto to 97/103 bid. BMET/BNP today and in 10 days.  - Continue Coreg 18.75 mg bid.  - Continue dapagliflozin 10 mg daily.  - Continue spironolactone 25 mg daily.   - Narrow QRS, so not CRT candidate.  - Patient is not sure yet about placement again of ICD give complicated initial ICD placement.  Subcutaneous ICD would be an option. However, EF up to 35% on cardiac MRI.  Repeat echo in 3 months to look for further improvement.  2. RV perforation with ICD placement: S/p sternotomy/repair in 6/22. As above, will decide on replacement of ICD based on what next echo shows.  3. Hip OA: Patient will would likely be an acceptable candidate in the future for THR.  She has minimal dyspnea.  This should be done at [facility] with close availability of cardiology.  Plan currently for next summer.    Followup with clinical pharmacist in 4 wks for med titration (increase Coreg), see me in 3 months with echo.    Covid booster-already done Flu shot complete  Her dose of Entresto was recently increased -she notes that taking entresto makes it harder for her to cough up anything that gets in her chest. She decreased her carvedilol to one tablet twice a day again and this helped her breathing/ bronchospasm   She notes a left sided chest pain that has been  present for about one week It is coming and going  She notes it more in the evening It is non- exertional  She notes minimal CP currently  She feels like her breathing is at baseline She actually saw her regular cardiologist 2 days ago but did not mention this pain  Negative chest film in July   Her hip surgery has been postponed due to her job- will be done in June   She is using farxiga now as well as her metformin twice a day -I offered to have her decrease metformin but she is happy at current dose Lab Results   Component Value Date   HGBA1C 6.6 (H) 02/07/2021     Patient Active Problem List   Diagnosis Date Noted   Hemothorax on left 10/02/2020   S/P evacuation of hematoma 10/02/2020   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 10/02/2020   Hypovolemic shock ([place]) 10/02/2020   Anxiety    Coronary artery disease 25% of RCA, 20% intermediate branch, 40% diagonal branch based on cardiac cath from 2022 07/22/2020   Dilated cardiomyopathy ([place]) ejection fraction 25% by echocardiogram from January 2022 04/29/2020   Congestive heart failure ([place]) 04/29/2020   Allergic rhinitis due to animal (cat) (dog) hair and dander 04/27/2020   Allergic rhinitis due to pollen 04/27/2020   Mild persistent asthma, uncomplicated 35/45/6256   PONV (postoperative nausea and vomiting)    Migraines    GERD (gastroesophageal reflux disease)    Environmental allergies    Diabetes mellitus without complication ([place])    Depression    Colon polyps    B12 deficiency 11/27/2019   IDA (iron deficiency anemia) 11/06/2019   Asthma, persistent controlled 10/05/2018   Recurrent infections 10/05/2018   Anxiety and depression 01/20/2018   Right upper lobe pulmonary nodule 01/10/2018   Nasal polyp, unspecified 01/10/2018   Left sided abdominal pain 12/13/2017   Impaired fasting glucose 12/12/2017   History of adenomatous polyp of colon 11/19/2017   Genetic testing 07/16/2017   Family history of breast cancer    Controlled type 2 diabetes mellitus without complication, without long-term current use of insulin ([place]) 05/28/2017   Personal history of radiation therapy 2019   Cancer ([place]) 2019   Malignant neoplasm of upper-outer quadrant of right breast in female, estrogen receptor positive ([place]) 03/20/2017   Crohn disease ([place]) 11/29/2016   Borderline diabetes 12/17/2015   Ex-smoker 11/18/2015   Chronic venous insufficiency 09/02/2015   Symptomatic varicose veins, right 09/02/2015   Chronic sinusitis 01/26/2015    Eustachian tube dysfunction 12/04/2014   Other allergic rhinitis 09/15/2014   Dyspnea 09/15/2014   Liver hemangioma 08/24/2014   Elevated triglycerides with high cholesterol 11/26/2013   Insomnia 08/20/2013   Hypertension 08/20/2013   Ulcerative colitis ([place]) 04/20/2013   Oral herpes 04/20/2013   Hyperlipidemia 12/06/2006   HEADACHE 12/06/2006   CERVICAL CANCER, HX OF 12/06/2006   Hypothyroidism 11/29/2006   Asthma 11/29/2006   IRRITABLE BOWEL SYNDROME 11/29/2006    Past Medical History:  Diagnosis Date   Allergic rhinitis due to animal (cat) (dog) hair and dander 04/27/2020   Allergic rhinitis due to pollen 04/27/2020   Anxiety    Anxiety and depression 01/20/2018   Asthma    Asthma, persistent controlled 10/05/2018   B12 deficiency 11/27/2019   Borderline diabetes 12/17/2015   Breast cancer ([place])    Cancer ([place]) 2019   right breast cancer   CERVICAL CANCER, HX OF 12/06/2006   Qualifier: Diagnosis of  By:  Arnoldo Morale MD, John E    Chronic sinusitis 01/26/2015   Chronic venous insufficiency 09/02/2015   Colon polyps    Congestive heart failure ([place]) 04/29/2020   Controlled type 2 diabetes mellitus without complication, without long-term current use of insulin ([place]) 05/28/2017   Coronary artery disease 25% of RCA, 20% intermediate branch, 40% diagonal branch based on cardiac cath from 2022 07/22/2020   Crohn disease ([place]) 11/29/2016   Depression    Diabetes mellitus without complication ([place])    type 2   Dilated cardiomyopathy ([place]) ejection fraction 25% by echocardiogram from January 2022 04/29/2020   Dyspnea 09/15/2014   Elevated triglycerides with high cholesterol 11/26/2013   Environmental allergies    Eustachian tube dysfunction 12/04/2014   Ex-smoker 11/18/2015   Family history of breast cancer    Genetic testing 07/16/2017   Negative genetic testing on the multi-cancer panel.  The Multi-Gene Panel offered by [name] includes sequencing and/or deletion duplication  testing of the following 83 genes: ALK, APC, ATM, AXIN2,BAP1,  BARD1, BLM, BMPR1A, BRCA1, BRCA2, BRIP1, CASR, CDC73, CDH1, CDK4, CDKN1B, CDKN1C, CDKN2A (p14ARF), CDKN2A (p16INK4a), CEBPA, CHEK2, CTNNA1, DICER1, DIS3L2, EGFR (c.2369C>T, p.Thr790Met variant onl   GERD (gastroesophageal reflux disease)    HEADACHE 12/06/2006   Qualifier: Diagnosis of  By: Arnoldo Morale MD, John E    Hemothorax on left 10/02/2020   History of adenomatous polyp of colon 11/19/2017   Hyperlipidemia    Hypertension    Hypothyroidism    IDA (iron deficiency anemia) 11/06/2019   Impaired fasting glucose 12/12/2017   Insomnia 08/20/2013   Irritable bowel syndrome 11/29/2006   Qualifier: Diagnosis of  By: Arnoldo Morale MD, John E    Left sided abdominal pain 12/13/2017   Liver hemangioma 08/24/2014   Malignant neoplasm of upper-outer quadrant of right breast in female, estrogen receptor positive ([place]) 03/20/2017   Migraines    Mild persistent asthma, uncomplicated 78/67/5449   Nasal polyp, unspecified 01/10/2018   Oral herpes 04/20/2013   Other allergic rhinitis 09/15/2014   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 10/02/2020   Personal history of radiation therapy 2019   36 radiation tx   PONV (postoperative nausea and vomiting)    scopolamine patch helps   Recurrent infections 10/05/2018   Right upper lobe pulmonary nodule 01/10/2018   Symptomatic varicose veins, right 09/02/2015   Ulcerative colitis ([place])    Varicose veins     Past Surgical History:  Procedure Laterality Date   BREAST BIOPSY Right 02/2017   BREAST BIOPSY Right 11/07/2019   BREAST LUMPECTOMY Right 04/24/2017   BREAST LUMPECTOMY WITH RADIOACTIVE SEED AND SENTINEL LYMPH NODE BIOPSY Right 04/24/2017   Procedure: RIGHT BREAST LUMPECTOMY WITH RADIOACTIVE SEED AND SENTINEL LYMPH NODE BIOPSY;  Surgeon: [name], MD;  Location: [facility];  Service: General;  Laterality: Right;   CORONARY ARTERY BYPASS GRAFT     crapal  tunnel relase Left 05/2018   ENDOMETRIAL ABLATION  2010   Had abnormal uterine bleeding (heavy and frequent), performed in [place], menses stopped completely 2 years after   EXPLORATORY LAPAROTOMY     Had exploratory surgery at the age of 25 for abdominal pain with cyst found on her left ovary, not treated surgically   FOOT SURGERY Right 2012   GENERATOR REMOVAL  10/02/2020   Procedure: GENERATOR REMOVAL;  Surgeon: Rexene Alberts, MD;  Location: [facility];  Service: Thoracic;;   HAND SURGERY Right    ICD IMPLANT N/A 10/01/2020   Procedure: ICD IMPLANT;  Surgeon: Constance Haw, MD;  Location: [facility];  Service: Cardiovascular;  Laterality: N/A;   ICD LEAD REMOVAL N/A 10/02/2020   Procedure: ICD LEAD REMOVAL;  Surgeon: Rexene Alberts, MD;  Location: [facility];  Service: Thoracic;  Laterality: N/A;   MEDIASTERNOTOMY N/A 10/02/2020   Procedure: MEDIAN STERNOTOMY REPAIR PERFORATED VENTRICLE EVACUATION LEFT HEMOTHORAX;  Surgeon: Rexene Alberts, MD;  Location: [facility];  Service: Thoracic;  Laterality: N/A;   RE-EXCISION OF BREAST LUMPECTOMY Right 05/15/2017   Procedure: RE-EXCISION OF BREAST LUMPECTOMY;  Surgeon: [name], MD;  Location: [facility];  Service: General;  Laterality: Right;   RIGHT/LEFT HEART CATH AND CORONARY ANGIOGRAPHY N/A 06/21/2020   Procedure: RIGHT/LEFT HEART CATH AND CORONARY ANGIOGRAPHY;  Surgeon: Nelva Bush, MD;  Location: [facility];  Service: Cardiovascular;  Laterality: N/A;   ROBOTIC ASSISTED SALPINGO OOPHERECTOMY Bilateral 08/29/2019   Procedure: XI ROBOTIC ASSISTED SALPINGO OOPHORECTOMY;  Surgeon: [name], MD;  Location: [facility];  Service: Gynecology;  Laterality: Bilateral;   Septoplasty with turbinate reduction     TEE WITHOUT CARDIOVERSION N/A 10/02/2020   Procedure: TRANSESOPHAGEAL ECHOCARDIOGRAM (TEE);  Surgeon: Rexene Alberts, MD;  Location: [facility];  Service: Thoracic;  Laterality:  N/A;   TONSILLECTOMY  1980   VEIN SURGERY     Removal Right Leg   WISDOM TOOTH EXTRACTION      Social History   Tobacco Use   Smoking status: Former    Packs/day: 3.00    Years: 15.00    Pack years: 45.00    Types: Cigarettes    Quit date: 09/14/1993    Years since quitting: 27.5   Smokeless tobacco: Never   Tobacco comments:    Quit 26 yrs ago  Vaping Use   Vaping Use: Never used  Substance Use Topics   Alcohol use: Yes    Alcohol/week: 1.0 - 2.0 standard drink    Types: 1 - 2 Glasses of wine per week    Comment: Drinks a couple every 2-3 months   Drug use: No    Family History  Problem Relation Age of Onset   Arthritis Mother 75       Deceased   Breast cancer Mother    Lung cancer Mother    Hyperlipidemia Mother    Heart disease Mother    Hypertension Mother    Diabetes Mother    Crohn's disease Mother    Diabetes Maternal Grandfather    Heart disease Maternal Grandfather    Heart attack Maternal Grandfather    Leukemia Maternal Grandfather    Colitis Sister    Leukemia Cousin 3       mat cousin   Lung cancer Father    Brain cancer Father     Allergies  Allergen Reactions   Losartan Potassium Shortness Of Breath and Other (See Comments)   Ace Inhibitors Cough   Aspirin Other (See Comments)    Upset stomach   Cefaclor Hives and Other (See Comments)   Oxycodone Hives   Sulfa Antibiotics Hives   Lisinopril Cough and Other (See Comments)    Medication list has been reviewed and updated.  Current Outpatient Medications on File Prior to Visit  Medication Sig Dispense Refill   acyclovir (ZOVIRAX) 400 MG tablet TAKE 1 TABLET (400 MG TOTAL) BY MOUTH 3 (THREE) TIMES DAILY AS NEEDED. 270 tablet 1   Adalimumab 80 MG/0.8ML PNKT Inject 80 mg into the skin every 14 (fourteen) days.  albuterol (VENTOLIN HFA) 108 (90 Base) MCG/ACT inhaler Inhale 2 puffs into the lungs every 6 (six) hours as needed for shortness of breath.     ascorbic acid (VITAMIN C) 1000 MG  tablet Take 1,000 mg by mouth daily.     aspirin EC 81 MG tablet Take 81 mg by mouth daily. Swallow whole.     Azelastine HCl 0.15 % SOLN Place 2 sprays into both nostrils 2 (two) times daily. For 30 days     Calcium Carb-Cholecalciferol (CALCIUM+D3) 600-800 MG-UNIT TABS Take 1 tablet by mouth daily.     carvedilol (COREG) 12.5 MG tablet Take 1.5 tablets (18.75 mg total) by mouth 2 (two) times daily. 270 tablet 3   cetirizine (ZYRTEC) 10 MG tablet Take 10 mg by mouth daily.     dapagliflozin propanediol (FARXIGA) 10 MG TABS tablet Take 1 tablet (10 mg total) by mouth daily before breakfast. 30 tablet 11   diphenoxylate-atropine (LOMOTIL) 2.5-0.025 MG tablet Take 2 tablets by mouth 4 (four) times daily as needed for diarrhea or loose stools.     EPINEPHrine 0.3 mg/0.3 mL IJ SOAJ injection Inject 0.3 mg into the muscle as needed for anaphylaxis.     exemestane (AROMASIN) 25 MG tablet TAKE 1 TABLET (25 MG TOTAL) BY MOUTH DAILY AFTER BREAKFAST. (Patient taking differently: Take 25 mg by mouth daily after breakfast.) 90 tablet 1   HYDROcodone-acetaminophen (NORCO/VICODIN) 5-325 MG tablet Take 1-2 tablets by mouth every 8 (eight) hours as needed. (Patient taking differently: Take 1-2 tablets by mouth every 8 (eight) hours as needed for moderate pain or severe pain.) 20 tablet 0   Hyoscyamine Sulfate 0.375 MG TBCR Take 0.375 mg by mouth in the morning and at bedtime.     lansoprazole (PREVACID SOLUTAB) 15 MG disintegrating tablet Take 15 mg by mouth 2 (two) times daily before a meal.     levothyroxine (SYNTHROID) 75 MCG tablet Take 1 tablet (75 mcg total) by mouth daily before breakfast. 30 tablet 6   MAGNESIUM PO Take 250 mg by mouth daily.     meloxicam (MOBIC) 15 MG tablet Take 15 mg by mouth daily as needed for pain (Hip pain).     Menthol, Topical Analgesic, (BIOFREEZE COLORLESS) 4 % GEL Apply 1 application topically daily as needed (Hip pain).     mesalamine (LIALDA) 1.2 g EC tablet Take 2.4 g by  mouth in the morning and at bedtime.      metFORMIN (GLUCOPHAGE) 500 MG tablet Take 1 tablet (500 mg total) by mouth 2 (two) times daily with a meal. 180 tablet 3   montelukast (SINGULAIR) 10 MG tablet Take 10 mg by mouth at bedtime.      Omega 3 1000 MG CAPS Take 2,000 mg by mouth every other day.     promethazine (PHENERGAN) 25 MG tablet Take 0.5-1 tablets (12.5-25 mg total) by mouth daily as needed for nausea or vomiting. 30 tablet 5   rosuvastatin (CRESTOR) 20 MG tablet TAKE 1 TABLET BY MOUTH EVERY DAY (Patient taking differently: Take 20 mg by mouth daily.) 90 tablet 1   sacubitril-valsartan (ENTRESTO) 97-103 MG Take 1 tablet by mouth 2 (two) times daily. 60 tablet 4   spironolactone (ALDACTONE) 25 MG tablet Take 1 tablet (25 mg total) by mouth daily. 45 tablet 3   SYMBICORT 160-4.5 MCG/ACT inhaler INHALE 2 PUFFS INTO THE LUNGS TWICE A DAY (Patient taking differently: Inhale 2 puffs into the lungs daily.) 30.6 each 1   triamcinolone (NASACORT) 55 MCG/ACT  AERO nasal inhaler Place 2 sprays into the nose daily.     venlafaxine XR (EFFEXOR-XR) 150 MG 24 hr capsule TAKE 1 CAPSULE BY MOUTH DAILY WITH BREAKFAST. (Patient taking differently: Take 150 mg by mouth daily with breakfast.) 90 capsule 1   zinc gluconate 50 MG tablet Take 50 mg by mouth daily.     zolpidem (AMBIEN) 5 MG tablet TAKE 1 TABLET BY MOUTH EVERY DAY AT BEDTIME AS NEEDED FOR SLEEP (Patient taking differently: Take 5 mg by mouth at bedtime as needed for sleep. TAKE 1 TABLET BY MOUTH EVERY DAY AT BEDTIME AS NEEDED FOR SLEEP) 30 tablet 2   No current facility-administered medications on file prior to visit.    Review of Systems:  As per HPI- otherwise negative.   Physical Examination: Vitals:   03/16/21 1100  BP: 112/70  Pulse: 76  Resp: 18  Temp: 97.6 F (36.4 C)  SpO2: 98%   Vitals:   03/16/21 1100  Weight: 156 lb 3.2 oz (70.9 kg)  Height: _0  (1.626 m)   Body mass index is 26.81 kg/m. Ideal Body Weight: Weight  in (lb) to have BMI = 25: 145.3  GEN: no acute distress.  Minimal overweight, looks well HEENT: Atraumatic, Normocephalic.  Ears and Nose: No external deformity. CV: RRR, No M/G/R. No JVD. No thrill. No extra heart sounds. PULM: CTA B, no wheezes, crackles, rhonchi. No retractions. No resp. distress. No accessory muscle use. ABD: S, NT, ND, +BS. No rebound. No HSM. EXTR: No c/c/e PSYCH: Normally interactive. Conversant.  Healing surgical sites on left chest wall, this area show significant tenderness.  EKG: SR with stable q waves V2 and t wave changes- no difference c/w 7/22 Discussed her situation with Dr. Loralie Champagne who agrees her chest pain seems noncardiac in origin and EKG is unchanged  Assessment and Plan: Diabetes mellitus without complication [facility])  Essential hypertension  Dilated cardiomyopathy (HCC) ejection fraction 25% by echocardiogram from January 2022  Left-sided chest pain - Plan: EKG 12-Lead  Patient seen today for follow-up.  She notes a left upper chest pain for about 1 week.  She had a negative cardiac cath in March of this year.  EKG is stable.  Discussed with cardiology, for the time being we will continue to monitor.  If this does not get better or if getting worse she will seek care Recent A1c showed good control of diabetes.  We will continue current dose of metformin as well as [place].  She plans to see me in May prior to her hip surgery to update A1c Blood pressures under good control Immunizations are up-to-date  Signed Lamar Blinks, MD

## 2021-03-14 ENCOUNTER — Other Ambulatory Visit: Payer: Self-pay

## 2021-03-14 ENCOUNTER — Encounter: Payer: Self-pay | Admitting: Cardiology

## 2021-03-14 ENCOUNTER — Ambulatory Visit (INDEPENDENT_AMBULATORY_CARE_PROVIDER_SITE_OTHER): Payer: No Typology Code available for payment source | Admitting: Cardiology

## 2021-03-14 VITALS — BP 124/76 | HR 78 | Ht 63.5 in | Wt 158.0 lb

## 2021-03-14 DIAGNOSIS — Z79899 Other long term (current) drug therapy: Secondary | ICD-10-CM | POA: Diagnosis not present

## 2021-03-14 NOTE — Patient Instructions (Signed)
Medication Instructions:  Your physician recommends that you continue on your current medications as directed. Please refer to the Current Medication list given to you today.  *If you need a refill on your cardiac medications before your next appointment, please call your pharmacy*   Lab Work: Your physician recommends that you return for lab work today: bmp   Bmp   If you have labs (blood work) drawn today and your tests are completely normal, you will receive your results only by: [place] [facility] (if you have MyChart) OR A paper copy in the mail If you have any lab test that is abnormal or we need to change your treatment, we will call you to review the results.   Testing/Procedures: None   Follow-Up: At [facility], you and your health needs are our priority.  As part of our continuing mission to provide you with exceptional heart care, we have created designated Provider Care Teams.  These Care Teams include your primary Cardiologist (physician) and Advanced Practice Providers (APPs -  Physician Assistants and Nurse Practitioners) who all work together to provide you with the care you need, when you need it.  We recommend signing up for the patient portal called "MyChart".  Sign up information is provided on this After Visit Summary.  MyChart is used to connect with patients for Virtual Visits (Telemedicine).  Patients are able to view lab/test results, encounter notes, upcoming appointments, etc.  Non-urgent messages can be sent to your provider as well.   To learn more about what you can do with MyChart, go to [email or web address].    Your next appointment:   2 month(s)  The format for your next appointment:   In Person  Provider:   Jenne Campus, MD    Other Instructions

## 2021-03-14 NOTE — Progress Notes (Signed)
Cardiology Office Note:    Date:  03/14/2021   [name], DOB December 13, 1965, MRN [id number]  PCP:  Darreld Mclean, MD  Cardiologist:  Jenne Campus, MD    Referring MD: Darreld Mclean, MD   Chief Complaint  Patient presents with   Follow-up         History of Present Illness:    Erin Fields is a 55 y.o. female  with complex past medical history.  She was initially referred to [place] because of EKG being abnormal that was done before elective hip replacement surgery after that echocardiogram has been performed and surprising she was found to have 25% ejection fraction which is severely diminished, that was followed by cardiac catheterization which showed nonobstructive disease she was put on appropriate medication in spite of that her ejection fraction was still diminished, she was referred to our EP team for consideration for ICD.  ICD was implanted and everything was fine she left home however while at home she started having chest pain.  She ended going to the emergency room thinking that she may have discharged from the defibrillator, device has been interrogated no discharge has being identified she was discharged home, however presented back within the next few hours with shortness of breath.  She suffered from perforation of the right ventricle with injury of the artery leading to blood collection in the pleura.  She required open heart surgery with repair of the perforation of the right ventricle as well as evacuation of hematoma from the left pleura.  She is coming today 2 months for follow-up overall she is doing well.  She was find to have low sodium and she is scared about that.  She did see advanced congestive heart failure clinic recently.  Plan is to continue present management and then recheck her echocardiogram in few months to see if there is any need for defibrillator.  Recently also dose of Entresto has been increased.  However she was unable to tolerate  it.  Interestingly she cut down her carvedilol to only 12.5 twice daily and that make her feel much better.  She is happy the way she feels right now.  Past Medical History:  Diagnosis Date   Allergic rhinitis due to animal (cat) (dog) hair and dander 04/27/2020   Allergic rhinitis due to pollen 04/27/2020   Anxiety    Anxiety and depression 01/20/2018   Asthma    Asthma, persistent controlled 10/05/2018   B12 deficiency 11/27/2019   Borderline diabetes 12/17/2015   Breast cancer ([place])    Cancer ([place]) 2019   right breast cancer   CERVICAL CANCER, HX OF 12/06/2006   Qualifier: Diagnosis of  By: Arnoldo Morale MD, John E    Chronic sinusitis 01/26/2015   Chronic venous insufficiency 09/02/2015   Colon polyps    Congestive heart failure ([place]) 04/29/2020   Controlled type 2 diabetes mellitus without complication, without long-term current use of insulin ([place]) 05/28/2017   Coronary artery disease 25% of RCA, 20% intermediate branch, 40% diagonal branch based on cardiac cath from 2022 07/22/2020   Crohn disease ([place]) 11/29/2016   Depression    Diabetes mellitus without complication ([place])    type 2   Dilated cardiomyopathy ([place]) ejection fraction 25% by echocardiogram from January 2022 04/29/2020   Dyspnea 09/15/2014   Elevated triglycerides with high cholesterol 11/26/2013   Environmental allergies    Eustachian tube dysfunction 12/04/2014   Ex-smoker 11/18/2015   Family history of breast cancer  Genetic testing 07/16/2017   Negative genetic testing on the multi-cancer panel.  The Multi-Gene Panel offered by [name] includes sequencing and/or deletion duplication testing of the following 83 genes: ALK, APC, ATM, AXIN2,BAP1,  BARD1, BLM, BMPR1A, BRCA1, BRCA2, BRIP1, CASR, CDC73, CDH1, CDK4, CDKN1B, CDKN1C, CDKN2A (p14ARF), CDKN2A (p16INK4a), CEBPA, CHEK2, CTNNA1, DICER1, DIS3L2, EGFR (c.2369C>T, p.Thr790Met variant onl   GERD (gastroesophageal reflux disease)    HEADACHE 12/06/2006    Qualifier: Diagnosis of  By: Arnoldo Morale MD, John E    Hemothorax on left 10/02/2020   History of adenomatous polyp of colon 11/19/2017   Hyperlipidemia    Hypertension    Hypothyroidism    IDA (iron deficiency anemia) 11/06/2019   Impaired fasting glucose 12/12/2017   Insomnia 08/20/2013   Irritable bowel syndrome 11/29/2006   Qualifier: Diagnosis of  By: Arnoldo Morale MD, John E    Left sided abdominal pain 12/13/2017   Liver hemangioma 08/24/2014   Malignant neoplasm of upper-outer quadrant of right breast in female, estrogen receptor positive ([place]) 03/20/2017   Migraines    Mild persistent asthma, uncomplicated 71/09/2692   Nasal polyp, unspecified 01/10/2018   Oral herpes 04/20/2013   Other allergic rhinitis 09/15/2014   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 10/02/2020   Personal history of radiation therapy 2019   36 radiation tx   PONV (postoperative nausea and vomiting)    scopolamine patch helps   Recurrent infections 10/05/2018   Right upper lobe pulmonary nodule 01/10/2018   Symptomatic varicose veins, right 09/02/2015   Ulcerative colitis ([place])    Varicose veins     Past Surgical History:  Procedure Laterality Date   BREAST BIOPSY Right 02/2017   BREAST BIOPSY Right 11/07/2019   BREAST LUMPECTOMY Right 04/24/2017   BREAST LUMPECTOMY WITH RADIOACTIVE SEED AND SENTINEL LYMPH NODE BIOPSY Right 04/24/2017   Procedure: RIGHT BREAST LUMPECTOMY WITH RADIOACTIVE SEED AND SENTINEL LYMPH NODE BIOPSY;  Surgeon: [name], MD;  Location: [facility];  Service: General;  Laterality: Right;   CORONARY ARTERY BYPASS GRAFT     crapal tunnel relase Left 05/2018   ENDOMETRIAL ABLATION  2010   Had abnormal uterine bleeding (heavy and frequent), performed in [place], menses stopped completely 2 years after   EXPLORATORY LAPAROTOMY     Had exploratory surgery at the age of 6 for abdominal pain with cyst found on her left ovary, not treated surgically   FOOT  SURGERY Right 2012   GENERATOR REMOVAL  10/02/2020   Procedure: GENERATOR REMOVAL;  Surgeon: Rexene Alberts, MD;  Location: [facility];  Service: Thoracic;;   HAND SURGERY Right    ICD IMPLANT N/A 10/01/2020   Procedure: ICD IMPLANT;  Surgeon: Constance Haw, MD;  Location: [facility];  Service: Cardiovascular;  Laterality: N/A;   ICD LEAD REMOVAL N/A 10/02/2020   Procedure: ICD LEAD REMOVAL;  Surgeon: Rexene Alberts, MD;  Location: [facility];  Service: Thoracic;  Laterality: N/A;   MEDIASTERNOTOMY N/A 10/02/2020   Procedure: MEDIAN STERNOTOMY REPAIR PERFORATED VENTRICLE EVACUATION LEFT HEMOTHORAX;  Surgeon: Rexene Alberts, MD;  Location: [facility];  Service: Thoracic;  Laterality: N/A;   RE-EXCISION OF BREAST LUMPECTOMY Right 05/15/2017   Procedure: RE-EXCISION OF BREAST LUMPECTOMY;  Surgeon: [name], MD;  Location: [facility];  Service: General;  Laterality: Right;   RIGHT/LEFT HEART CATH AND CORONARY ANGIOGRAPHY N/A 06/21/2020   Procedure: RIGHT/LEFT HEART CATH AND CORONARY ANGIOGRAPHY;  Surgeon: Nelva Bush, MD;  Location: [facility];  Service: Cardiovascular;  Laterality: N/A;   ROBOTIC ASSISTED SALPINGO OOPHERECTOMY Bilateral 08/29/2019   Procedure: XI ROBOTIC ASSISTED SALPINGO OOPHORECTOMY;  Surgeon: [name], MD;  Location[facility];  Service: Gynecology;  Laterality: Bilateral;   Septoplasty with turbinate reduction     TEE WITHOUT CARDIOVERSION N/A 10/02/2020   Procedure: TRANSESOPHAGEAL ECHOCARDIOGRAM (TEE);  Surgeon: Rexene Alberts, MD;  Location: [facility];  Service: Thoracic;  Laterality: N/A;   TONSILLECTOMY  1980   VEIN SURGERY     Removal Right Leg   WISDOM TOOTH EXTRACTION      Current Medications: Current Meds  Medication Sig   acyclovir (ZOVIRAX) 400 MG tablet TAKE 1 TABLET (400 MG TOTAL) BY MOUTH 3 (THREE) TIMES DAILY AS NEEDED.   Adalimumab 80 MG/0.8ML PNKT Inject 80 mg into the skin every 14  (fourteen) days.   albuterol (VENTOLIN HFA) 108 (90 Base) MCG/ACT inhaler Inhale 2 puffs into the lungs every 6 (six) hours as needed for shortness of breath.   ascorbic acid (VITAMIN C) 1000 MG tablet Take 1,000 mg by mouth daily.   aspirin EC 81 MG tablet Take 81 mg by mouth daily. Swallow whole.   Azelastine HCl 0.15 % SOLN Place 2 sprays into both nostrils 2 (two) times daily. For 30 days   Calcium Carb-Cholecalciferol (CALCIUM+D3) 600-800 MG-UNIT TABS Take 1 tablet by mouth daily.   carvedilol (COREG) 12.5 MG tablet Take 1.5 tablets (18.75 mg total) by mouth 2 (two) times daily.   cetirizine (ZYRTEC) 10 MG tablet Take 10 mg by mouth daily.   dapagliflozin propanediol (FARXIGA) 10 MG TABS tablet Take 1 tablet (10 mg total) by mouth daily before breakfast.   diphenoxylate-atropine (LOMOTIL) 2.5-0.025 MG tablet Take 2 tablets by mouth 4 (four) times daily as needed for diarrhea or loose stools.   EPINEPHrine 0.3 mg/0.3 mL IJ SOAJ injection Inject 0.3 mg into the muscle as needed for anaphylaxis.   exemestane (AROMASIN) 25 MG tablet TAKE 1 TABLET (25 MG TOTAL) BY MOUTH DAILY AFTER BREAKFAST. (Patient taking differently: Take 25 mg by mouth daily after breakfast.)   HYDROcodone-acetaminophen (NORCO/VICODIN) 5-325 MG tablet Take 1-2 tablets by mouth every 8 (eight) hours as needed. (Patient taking differently: Take 1-2 tablets by mouth every 8 (eight) hours as needed for moderate pain or severe pain.)   Hyoscyamine Sulfate 0.375 MG TBCR Take 0.375 mg by mouth in the morning and at bedtime.   lansoprazole (PREVACID SOLUTAB) 15 MG disintegrating tablet Take 15 mg by mouth 2 (two) times daily before a meal.   levothyroxine (SYNTHROID) 75 MCG tablet Take 1 tablet (75 mcg total) by mouth daily before breakfast.   MAGNESIUM PO Take 250 mg by mouth daily.   meloxicam (MOBIC) 15 MG tablet Take 15 mg by mouth daily as needed for pain (Hip pain).   Menthol, Topical Analgesic, (BIOFREEZE COLORLESS) 4 % GEL  Apply 1 application topically daily as needed (Hip pain).   mesalamine (LIALDA) 1.2 g EC tablet Take 2.4 g by mouth in the morning and at bedtime.    metFORMIN (GLUCOPHAGE) 500 MG tablet Take 1 tablet (500 mg total) by mouth 2 (two) times daily with a meal.   montelukast (SINGULAIR) 10 MG tablet Take 10 mg by mouth at bedtime.    Omega 3 1000 MG CAPS Take 2,000 mg by mouth every other day.   promethazine (PHENERGAN) 25 MG tablet Take 0.5-1 tablets (12.5-25 mg total) by mouth daily as needed for nausea or vomiting.  rosuvastatin (CRESTOR) 20 MG tablet TAKE 1 TABLET BY MOUTH EVERY DAY (Patient taking differently: Take 20 mg by mouth daily.)   sacubitril-valsartan (ENTRESTO) 97-103 MG Take 1 tablet by mouth 2 (two) times daily.   spironolactone (ALDACTONE) 25 MG tablet Take 1 tablet (25 mg total) by mouth daily.   SYMBICORT 160-4.5 MCG/ACT inhaler INHALE 2 PUFFS INTO THE LUNGS TWICE A DAY (Patient taking differently: Inhale 2 puffs into the lungs daily.)   triamcinolone (NASACORT) 55 MCG/ACT AERO nasal inhaler Place 2 sprays into the nose daily.   venlafaxine XR (EFFEXOR-XR) 150 MG 24 hr capsule TAKE 1 CAPSULE BY MOUTH DAILY WITH BREAKFAST. (Patient taking differently: Take 150 mg by mouth daily with breakfast.)   zinc gluconate 50 MG tablet Take 50 mg by mouth daily.   zolpidem (AMBIEN) 5 MG tablet TAKE 1 TABLET BY MOUTH EVERY DAY AT BEDTIME AS NEEDED FOR SLEEP (Patient taking differently: Take 5 mg by mouth at bedtime as needed for sleep. TAKE 1 TABLET BY MOUTH EVERY DAY AT BEDTIME AS NEEDED FOR SLEEP)     Allergies:   Losartan potassium, Ace inhibitors, Aspirin, Cefaclor, Oxycodone, Sulfa antibiotics, and Lisinopril   Social History   Socioeconomic History   Marital status: Widowed    Spouse name: Not on file   Number of children: Not on file   Years of education: Not on file   Highest education level: Not on file  Occupational History   Not on file  Tobacco Use   Smoking status: Former     Packs/day: 3.00    Years: 15.00    Pack years: 45.00    Types: Cigarettes    Quit date: 09/14/1993    Years since quitting: 27.5   Smokeless tobacco: Never   Tobacco comments:    Quit 26 yrs ago  Vaping Use   Vaping Use: Never used  Substance and Sexual Activity   Alcohol use: Yes    Alcohol/week: 1.0 - 2.0 standard drink    Types: 1 - 2 Glasses of wine per week    Comment: Drinks a couple every 2-3 months   Drug use: No   Sexual activity: Not Currently    Comment: ablation  Other Topics Concern   Not on file  Social History Narrative   Not on file   Social Determinants of Health   Financial Resource Strain: Not on file  Food Insecurity: Not on file  Transportation Needs: Not on file  Physical Activity: Not on file  Stress: Not on file  Social Connections: Not on file     Family History: The patient's family history includes Arthritis (age of onset: 75) in her mother; Brain cancer in her father; Breast cancer in her mother; Colitis in her sister; Crohn's disease in her mother; Diabetes in her maternal grandfather and mother; Heart attack in her maternal grandfather; Heart disease in her maternal grandfather and mother; Hyperlipidemia in her mother; Hypertension in her mother; Leukemia in her maternal grandfather; Leukemia (age of onset: 58) in her cousin; Lung cancer in her father and mother. ROS:   Please see the history of present illness.    All 14 point review of systems negative except as described per history of present illness  EKGs/Labs/Other Studies Reviewed:      Recent Labs: 11/11/2020: Magnesium 1.6 01/21/2021: ALT 18; Hemoglobin 14.2; Platelet Count 370 02/07/2021: TSH 1.51 03/04/2021: B Natriuretic Peptide 18.2; BUN 11; Creatinine, Ser 0.65; Potassium 4.5; Sodium 129  Recent Lipid Panel  Component Value Date/Time   CHOL 128 08/10/2020 0936   TRIG 153 (H) 08/10/2020 0936   HDL 37 (L) 08/10/2020 0936   CHOLHDL 3.5 08/10/2020 0936   CHOLHDL 4  06/18/2019 0952   VLDL 31.8 06/18/2019 0952   LDLCALC 65 08/10/2020 0936   LDLDIRECT 101.0 12/02/2018 0955    Physical Exam:    VS:  BP 124/76 (BP Location: Left Arm, Patient Position: Sitting)   Pulse 78   Ht 5' 3.5" (1.613 m)   Wt 158 lb (71.7 kg)   SpO2 95%   BMI 27.55 kg/m     Wt Readings from Last 3 Encounters:  03/14/21 158 lb (71.7 kg)  03/04/21 159 lb 9.6 oz (72.4 kg)  02/21/21 155 lb (70.3 kg)     GEN:  Well nourished, well developed in no acute distress HEENT: Normal NECK: No JVD; No carotid bruits LYMPHATICS: No lymphadenopathy CARDIAC: RRR, no murmurs, no rubs, no gallops RESPIRATORY:  Clear to auscultation without rales, wheezing or rhonchi  ABDOMEN: Soft, non-tender, non-distended MUSCULOSKELETAL:  No edema; No deformity  SKIN: Warm and dry LOWER EXTREMITIES: no swelling NEUROLOGIC:  Alert and oriented x 3 PSYCHIATRIC:  Normal affect   ASSESSMENT:    1. Medication management    PLAN:    In order of problems listed above:  Cardiomyopathy.  Ejection fraction [place] of 35%.  She is on appropriate guideline directed medical therapy.  Plan is to repeat echocardiogram within next 3 months to see if there is any other indications for defibrillator.  She did have defibrillator implanted but it was complicated by perforation that required open heart and open chest surgery. Hyponatremia she is very worried about it she spent all weekend reading about it.  I will recheck her Chem-7 today. History of ICD with perforation she is doing well and she is getting appetite back and she is gaining weight again Essential hypertension: Blood pressure well controlled. Congestive heart failure: [place] Heart Association class II on appropriate medications.   Medication Adjustments/Labs and Tests Ordered: Current medicines are reviewed at length with the patient today.  Concerns regarding medicines are outlined above.  Orders Placed This Encounter  Procedures   Basic  metabolic panel   Medication changes: No orders of the defined types were placed in this encounter.   Signed, Park Liter, MD, [facility] 03/14/2021 4:58 PM    [facility]

## 2021-03-14 NOTE — Progress Notes (Signed)
Bmp

## 2021-03-16 ENCOUNTER — Ambulatory Visit (INDEPENDENT_AMBULATORY_CARE_PROVIDER_SITE_OTHER): Payer: No Typology Code available for payment source | Admitting: Family Medicine

## 2021-03-16 VITALS — BP 112/70 | HR 76 | Temp 97.6°F | Resp 18 | Ht 64.0 in | Wt 156.2 lb

## 2021-03-16 DIAGNOSIS — E119 Type 2 diabetes mellitus without complications: Secondary | ICD-10-CM

## 2021-03-16 DIAGNOSIS — R079 Chest pain, unspecified: Secondary | ICD-10-CM

## 2021-03-16 DIAGNOSIS — I10 Essential (primary) hypertension: Secondary | ICD-10-CM | POA: Diagnosis not present

## 2021-03-16 DIAGNOSIS — I420 Dilated cardiomyopathy: Secondary | ICD-10-CM

## 2021-03-16 LAB — BASIC METABOLIC PANEL
BUN/Creatinine Ratio: 12 (ref 9–23)
BUN: 8 mg/dL (ref 6–24)
CO2: 22 mmol/L (ref 20–29)
Calcium: 9.8 mg/dL (ref 8.7–10.2)
Chloride: 95 mmol/L — ABNORMAL LOW (ref 96–106)
Creatinine, Ser: 0.66 mg/dL (ref 0.57–1.00)
Glucose: 92 mg/dL (ref 70–99)
Potassium: 5.4 mmol/L — ABNORMAL HIGH (ref 3.5–5.2)
Sodium: 135 mmol/L (ref 134–144)
eGFR: 102 mL/min/{1.73_m2} (ref >59)

## 2021-03-16 NOTE — Patient Instructions (Signed)
Please see me in 6 months or sooner if need be- prior to your surgery is perfect Let me or your cardiology team know if the chest pain is getting worse or changing at all

## 2021-03-17 ENCOUNTER — Telehealth: Payer: Self-pay | Admitting: Emergency Medicine

## 2021-03-17 DIAGNOSIS — Z79899 Other long term (current) drug therapy: Secondary | ICD-10-CM

## 2021-03-17 NOTE — Telephone Encounter (Signed)
Patient informed of results. Patient will repeat blood work next week.

## 2021-03-17 NOTE — Telephone Encounter (Signed)
-----   Message from Park Liter, MD sent at 03/16/2021 12:17 PM EST ----- Potassium a little bit elevated 5.4, please avoid foods with high potassium content and lets recheck her Chem-7 next week

## 2021-03-21 NOTE — Progress Notes (Signed)
PCP: Dr. Lorelei Pont Cardiology: Dr. Agustin Cree HF Cardiology: Dr. Aundra Dubin  HPI:   55 y.o. with history of ulcerative colitis, HTN, and breast cancer was referred by Dr. Agustin Cree for evaluation of CHF.   Patient's breast cancer was treated with surgery and radiation, no chemo.  Patient reports exertional dyspnea since 2019.  Patient has right hip OA, needs THR.  Echo was done in 04/2020 as part of pre-op workup, this showed low EF 25%.  She then had RHC/LHC in 06/2020 with mild nonobstructive CAD and low cardiac index 2.1.  Echo in 07/2020 showed EF still 20-25%.  She was referred for ICD.  St Jude ICD was placed in 09/735 but complicated by RV perforation with hemothorax and hemorrhagic shock.  ICD was removed and RV was surgically repaired with sternotomy.  Patient's mother had CHF in her 10G, uncertain etiology.    Cardiac MRI in 12/2020 showed EF 35% with moderate LV hypokinesis, RV EF 44%, small area of LGE at the true apex (?small embolic apical infarct).    Recently presented to HF Clinic on 03/04/21 with Dr. Aundra Dubin.  Main complaint continued to be right hip pain.  She has significant osteoarthritis and wants to have THR done.  No dyspnea walking on flat ground.  Not doing much strenuous activity due to high pain.  She has asthma with occasional coughing/wheezing/dyspnea, but not currently active.  No orthopnea/PND.  No palpitations or chest pain.    Today she returns to HF clinic for pharmacist medication titration. At visit with MD, Delene Loll was increased to 97/103 mg BID. On 03/15/21, potassium was elevated at 5.4 and remained elevated on repeat labs 03/21/21. Spironolactone was decreased to 12.5 mg daily and potassium decreased to 4.9 on 03/30/21. She did not feel good after increasing the Entresto. Her chest was hurting and she did not feel like herself. EKG at her PCP office was normal per patient. She decided to keep the Entresto at the increased dose, but self-decreased the carvedilol back to 12.5 mg  BID. Notes she felt better after that change. No dizziness or lightheadedness. Has felt her "heart racing" a few times per day and notes her HR has increased to 80-90s at home. No SOB/DOE but activity is limited due to hip pain. Weight has slowly increased ~8-10 lbs over the last few months. She believes this is related to dietary indiscretions, not fluid. No LEE, PND or orthopnea. BPs at home have been 120s/70s.   HF Medications: Carvedilol 18.75 mg BID - only taking 12.5 mg BID Entresto 97/103 mg BID Spironolactone 12.5 mg daily  Dapagliflozin 10 mg daily    Does the patient have any problems obtaining medications due to transportation or finances?   No - has Geneticist, molecular of regimen: excellent Understanding of indications: good Potential of compliance: excellent Patient understands to avoid NSAIDs. Patient understands to avoid decongestants.    Pertinent Lab Values:  03/30/21: Serum creatinine 0.10, BUN 8, Potassium 4.9, Sodium 135  Vital Signs:  Weight: 158.4 lbs (last clinic weight: 159.6 lbs) Blood pressure: 116/72 Heart rate: 83   Assessment/Plan: 1. Chronic systolic CHF: Nonischemic cardiomyopathy.  EF noted to be low since 04/2020.  LHC in 06/2020 with mild nonobstructive coronary disease.  Last echo in 09/2020 with EF 30-35%.  Cause uncertain, her mother had a cardiomyopathy of uncertain etiology so cannot rule out familial cardiomyopathy.  She had treatment for breast cancer but this did not include chemotherapy.  She has been on adalimumab for  ulcerative colitis, but this does not commonly cause CHF.  No drugs and rare ETOH.  Cardiac MRI performed on 01/12/2021 LVEF 35%, normal right ventricular size with borderline decreased systolic function, EF 32%.   -NYHA class II symptoms, more limited by hip pain.  Not volume overloaded on exam.  - Increase carvedilol to 12.5 mg qAM/18.75 mg qPM. Will increase slowly given tolerability issues.  - Continue  Entresto 97/103 mg BID - Continue spironolactone 12.5 mg daily. Recently decreased due to hyperkalemia. Follow low potassium diet.   - Continue dapagliflozin 10 mg daily  - Repeat BMET in 2 weeks - Narrow QRS, so not CRT candidate.  - Patient is not sure yet about placement again of ICD give complicated initial ICD placement.  Subcutaneous ICD would be an option. However, EF up to 35% on cardiac MRI.  Repeat echo 05/2021 to look for further improvement.  2. RV perforation with ICD placement: S/p sternotomy/repair in 09/2020. As above, will decide on replacement of ICD based on what next echo shows.  3. Hip OA: Patient will would likely be an acceptable candidate in the future for THR.  She has minimal dyspnea.  This [facility] be [facility] at [facility] with close availability of cardiology.  Plan currently for next summer.    Follow up with Dr. Domingo Dimes 06/08/21.   Audry Riles, PharmD, BCPS, BCCP, CPP Heart Failure Clinic Pharmacist [phone]

## 2021-03-22 ENCOUNTER — Encounter: Payer: Self-pay | Admitting: Cardiology

## 2021-03-22 DIAGNOSIS — I10 Essential (primary) hypertension: Secondary | ICD-10-CM

## 2021-03-22 LAB — BASIC METABOLIC PANEL
BUN/Creatinine Ratio: 10 (ref 9–23)
BUN: 7 mg/dL (ref 6–24)
CO2: 21 mmol/L (ref 20–29)
Calcium: 10.1 mg/dL (ref 8.7–10.2)
Chloride: 93 mmol/L — ABNORMAL LOW (ref 96–106)
Creatinine, Ser: 0.67 mg/dL (ref 0.57–1.00)
Glucose: 104 mg/dL — ABNORMAL HIGH (ref 70–99)
Potassium: 5.4 mmol/L — ABNORMAL HIGH (ref 3.5–5.2)
Sodium: 134 mmol/L (ref 134–144)
eGFR: 102 mL/min/{1.73_m2} (ref >59)

## 2021-03-23 ENCOUNTER — Encounter: Payer: Self-pay | Admitting: Family Medicine

## 2021-03-23 MED ORDER — SPIRONOLACTONE 25 MG PO TABS: 12.5000 mg | ORAL_TABLET | Freq: Every day | ORAL | 3 refills | Status: DC

## 2021-03-23 MED ORDER — HYDROCODONE-ACETAMINOPHEN 5-325 MG PO TABS: 1.0000 | ORAL_TABLET | Freq: Three times a day (TID) | ORAL | 0 refills | Status: DC | PRN

## 2021-03-24 ENCOUNTER — Telehealth: Payer: Self-pay

## 2021-03-24 DIAGNOSIS — Z79899 Other long term (current) drug therapy: Secondary | ICD-10-CM

## 2021-03-24 NOTE — Telephone Encounter (Signed)
-----   Message from Park Liter, MD sent at 03/23/2021  5:06 PM EST ----- I think we already decided to cut down her Aldactone to 12.5 mg daily but if so she needs to have Chem-7 next week

## 2021-03-24 NOTE — Telephone Encounter (Signed)
LM to return my call. 

## 2021-03-24 NOTE — Addendum Note (Signed)
Addended by: Senaida Ores on: 03/24/2021 03:04 PM   Modules accepted: Orders

## 2021-03-24 NOTE — Telephone Encounter (Signed)
Patient is returning call.  °

## 2021-03-24 NOTE — Telephone Encounter (Signed)
Patient informed of results. She is taking aldactone 12.5 mg daily. She will repeat labs in 1 week.

## 2021-03-31 ENCOUNTER — Telehealth: Payer: Self-pay | Admitting: Cardiology

## 2021-03-31 LAB — BASIC METABOLIC PANEL
BUN/Creatinine Ratio: 11 (ref 9–23)
BUN: 8 mg/dL (ref 6–24)
CO2: 19 mmol/L — ABNORMAL LOW (ref 20–29)
Calcium: 10 mg/dL (ref 8.7–10.2)
Chloride: 99 mmol/L (ref 96–106)
Creatinine, Ser: 0.7 mg/dL (ref 0.57–1.00)
Glucose: 107 mg/dL — ABNORMAL HIGH (ref 70–99)
Potassium: 4.9 mmol/L (ref 3.5–5.2)
Sodium: 135 mmol/L (ref 134–144)
eGFR: 101 mL/min/{1.73_m2} (ref >59)

## 2021-03-31 NOTE — Telephone Encounter (Signed)
° °  Pt is calling to get lab result

## 2021-03-31 NOTE — Telephone Encounter (Signed)
Patient informed of results.  

## 2021-04-04 ENCOUNTER — Ambulatory Visit (HOSPITAL_COMMUNITY)
Admission: RE | Admit: 2021-04-04 | Discharge: 2021-04-04 | Disposition: A | Discharge status: Home / Self Care | Payer: No Typology Code available for payment source | Source: Ambulatory Visit | Attending: Cardiology | Admitting: Cardiology

## 2021-04-04 ENCOUNTER — Other Ambulatory Visit: Payer: Self-pay

## 2021-04-04 VITALS — BP 116/72 | HR 83 | Wt 158.4 lb

## 2021-04-04 DIAGNOSIS — I2510 Atherosclerotic heart disease of native coronary artery without angina pectoris: Secondary | ICD-10-CM | POA: Diagnosis not present

## 2021-04-04 DIAGNOSIS — Z79899 Other long term (current) drug therapy: Secondary | ICD-10-CM | POA: Insufficient documentation

## 2021-04-04 DIAGNOSIS — E875 Hyperkalemia: Secondary | ICD-10-CM | POA: Insufficient documentation

## 2021-04-04 DIAGNOSIS — M1611 Unilateral primary osteoarthritis, right hip: Secondary | ICD-10-CM | POA: Insufficient documentation

## 2021-04-04 DIAGNOSIS — I110 Hypertensive heart disease with heart failure: Secondary | ICD-10-CM | POA: Insufficient documentation

## 2021-04-04 DIAGNOSIS — I5022 Chronic systolic (congestive) heart failure: Secondary | ICD-10-CM

## 2021-04-04 DIAGNOSIS — K5190 Ulcerative colitis, unspecified, without complications: Secondary | ICD-10-CM | POA: Diagnosis not present

## 2021-04-04 DIAGNOSIS — Z853 Personal history of malignant neoplasm of breast: Secondary | ICD-10-CM | POA: Diagnosis not present

## 2021-04-04 MED ORDER — SPIRONOLACTONE 25 MG PO TABS: 12.5000 mg | ORAL_TABLET | Freq: Every day | ORAL | 3 refills | Status: DC

## 2021-04-04 NOTE — Patient Instructions (Addendum)
It was a pleasure seeing you today!  MEDICATIONS: -We are changing your medications today -Increase carvedilol  to 12.5 mg (1 tablet) every morning and 18.75 mg (1.5 tablets) every evening.  -Call if you have questions about your medications.   NEXT APPOINTMENT: Return to clinic in 2 months with Dr. Aundra Dubin.  In general, to take care of your heart failure: -Limit your fluid intake to 2 Liters (half-gallon) per day.   -Limit your salt intake to ideally 2-3 grams (2000-3000 mg) per day. -Weigh yourself daily and record, and bring that "weight diary" to your next appointment.  (Weight gain of 2-3 pounds in 1 day typically means fluid weight.) -The medications for your heart are to help your heart and help you live longer.   -Please contact us before stopping any of your heart medications.  Call the clinic at [phone] with questions or to reschedule future appointments.

## 2021-04-10 ENCOUNTER — Other Ambulatory Visit: Payer: Self-pay | Admitting: Family Medicine

## 2021-04-10 DIAGNOSIS — F5104 Psychophysiologic insomnia: Secondary | ICD-10-CM

## 2021-04-11 ENCOUNTER — Encounter: Payer: Self-pay | Admitting: Family Medicine

## 2021-04-11 DIAGNOSIS — F5104 Psychophysiologic insomnia: Secondary | ICD-10-CM

## 2021-04-12 MED ORDER — ZOLPIDEM TARTRATE 5 MG PO TABS: 5.0000 mg | ORAL_TABLET | Freq: Every evening | ORAL | 3 refills | Status: DC | PRN

## 2021-04-13 ENCOUNTER — Telehealth: Payer: Self-pay | Admitting: Family Medicine

## 2021-04-13 NOTE — Telephone Encounter (Signed)
[employer] Nurse Care Case Manager: Caryl Pina Ward: [phone]  Called and stated she can be contacted at this number if anything is needed for pt etc.

## 2021-04-19 ENCOUNTER — Telehealth (HOSPITAL_COMMUNITY): Payer: Self-pay | Admitting: Pharmacist

## 2021-04-19 ENCOUNTER — Ambulatory Visit (HOSPITAL_COMMUNITY)
Admission: RE | Admit: 2021-04-19 | Discharge: 2021-04-19 | Disposition: A | Discharge status: Home / Self Care | Payer: No Typology Code available for payment source | Source: Ambulatory Visit | Attending: Internal Medicine | Admitting: Internal Medicine

## 2021-04-19 ENCOUNTER — Other Ambulatory Visit: Payer: Self-pay

## 2021-04-19 DIAGNOSIS — I5022 Chronic systolic (congestive) heart failure: Secondary | ICD-10-CM | POA: Insufficient documentation

## 2021-04-19 LAB — BASIC METABOLIC PANEL
Anion gap: 7 (ref 5–15)
BUN: 6 mg/dL (ref 6–20)
CO2: 28 mmol/L (ref 22–32)
Calcium: 9.2 mg/dL (ref 8.9–10.3)
Chloride: 97 mmol/L — ABNORMAL LOW (ref 98–111)
Creatinine, Ser: 0.72 mg/dL (ref 0.44–1.00)
GFR, Estimated: >60 mL/min (ref >60)
Glucose, Bld: 111 mg/dL — ABNORMAL HIGH (ref 70–99)
Potassium: 4.7 mmol/L (ref 3.5–5.1)
Sodium: 132 mmol/L — ABNORMAL LOW (ref 135–145)

## 2021-04-19 MED ORDER — CARVEDILOL 12.5 MG PO TABS
12.5000 mg | ORAL_TABLET | Freq: Two times a day (BID) | ORAL | 3 refills | Status: DC
Start: 2021-04-19 — End: 2022-03-16

## 2021-04-19 NOTE — Telephone Encounter (Signed)
Lab results 04/19/21 with low sodium at 132. Last time carvedilol was increased, sodium also decreased (129). Will have patient decrease carvedilol back to 12.5 mg BID. Patient aware. Medication list updated.   Audry Riles, PharmD, BCPS, BCCP, CPP Heart Failure Clinic Pharmacist [phone]

## 2021-04-27 ENCOUNTER — Encounter: Payer: Self-pay | Admitting: Family Medicine

## 2021-04-27 DIAGNOSIS — E034 Atrophy of thyroid (acquired): Secondary | ICD-10-CM

## 2021-04-27 MED ORDER — LEVOTHYROXINE SODIUM 75 MCG PO TABS: 75.0000 ug | ORAL_TABLET | Freq: Every day | ORAL | 2 refills | Status: DC

## 2021-04-27 MED ORDER — HYDROCODONE-ACETAMINOPHEN 5-325 MG PO TABS: 1.0000 | ORAL_TABLET | Freq: Three times a day (TID) | ORAL | 0 refills | Status: DC | PRN

## 2021-04-28 ENCOUNTER — Other Ambulatory Visit: Payer: Self-pay | Admitting: Hematology

## 2021-04-28 DIAGNOSIS — C50411 Malignant neoplasm of upper-outer quadrant of right female breast: Secondary | ICD-10-CM

## 2021-05-03 ENCOUNTER — Inpatient Hospital Stay: Admit: 2021-05-03 | Payer: No Typology Code available for payment source | Admitting: Orthopedic Surgery

## 2021-05-03 SURGERY — ARTHROPLASTY, HIP, TOTAL, ANTERIOR APPROACH
Anesthesia: Choice | Site: Hip | Laterality: Right

## 2021-05-04 ENCOUNTER — Other Ambulatory Visit: Payer: Self-pay | Admitting: Family Medicine

## 2021-05-04 DIAGNOSIS — F430 Acute stress reaction: Secondary | ICD-10-CM

## 2021-05-09 ENCOUNTER — Ambulatory Visit: Payer: No Typology Code available for payment source | Admitting: Family Medicine

## 2021-05-09 ENCOUNTER — Encounter: Payer: Self-pay | Admitting: Family Medicine

## 2021-05-09 VITALS — BP 99/59 | HR 84 | Temp 97.8°F | Resp 12 | Ht 63.5 in | Wt 161.4 lb

## 2021-05-09 DIAGNOSIS — J4521 Mild intermittent asthma with (acute) exacerbation: Secondary | ICD-10-CM | POA: Diagnosis not present

## 2021-05-09 DIAGNOSIS — J3089 Other allergic rhinitis: Secondary | ICD-10-CM | POA: Diagnosis not present

## 2021-05-09 MED ORDER — PREDNISONE 20 MG PO TABS: 40.0000 mg | ORAL_TABLET | Freq: Every day | ORAL | 0 refills | Status: DC

## 2021-05-09 NOTE — Progress Notes (Signed)
Acute Office Visit  Subjective:    Patient ID: Erin Fields, female    DOB: [birth year], 56 y.o.   MRN: [id number]  Chief Complaint  Patient presents with   sneezing    benadryl   Cough    HPI Patient is in today for allergy/asthma exacerbation.   She states for the past several weeks she has had frequent sneezing, coughing, shortness of breath, occasional wheezing.  Cough is usually dry but a few times she has gotten up some white sputum.  Reports her chest does feel tight when she breathes, history of asthma.  She has had to use her rescue inhaler at least once a day sometimes twice.  She has been taking Zyrtec, Singulair, Nasacort, Astelin, Symbicort, allergy shots every week, Benadryl almost daily since this flareup began.  She denies any fevers, sinus pressure, headaches, GI/GU symptoms, ear pain.  She has not been on any steroids recently.      Past Medical History:  Diagnosis Date   Allergic rhinitis due to animal (cat) (dog) hair and dander 04/27/2020   Allergic rhinitis due to pollen 04/27/2020   Anxiety    Anxiety and depression 01/20/2018   Asthma    Asthma, persistent controlled 10/05/2018   B12 deficiency 11/27/2019   Borderline diabetes 12/17/2015   Breast cancer ([place])    Cancer ([place]) 2019   right breast cancer   CERVICAL CANCER, HX OF 12/06/2006   Qualifier: Diagnosis of  By: Arnoldo Morale MD, John E    Chronic sinusitis 01/26/2015   Chronic venous insufficiency 09/02/2015   Colon polyps    Congestive heart failure ([place]) 04/29/2020   Controlled type 2 diabetes mellitus without complication, without long-term current use of insulin ([place]) 05/28/2017   Coronary artery disease 25% of RCA, 20% intermediate branch, 40% diagonal branch based on cardiac cath from 2022 07/22/2020   Crohn disease ([place]) 11/29/2016   Depression    Diabetes mellitus without complication ([place])    type 2   Dilated cardiomyopathy ([place]) ejection fraction 25% by echocardiogram from January  2022 04/29/2020   Dyspnea 09/15/2014   Elevated triglycerides with high cholesterol 11/26/2013   Environmental allergies    Eustachian tube dysfunction 12/04/2014   Ex-smoker 11/18/2015   Family history of breast cancer    Genetic testing 07/16/2017   Negative genetic testing on the multi-cancer panel.  The Multi-Gene Panel offered by [name] includes sequencing and/or deletion duplication testing of the following 83 genes: ALK, APC, ATM, AXIN2,BAP1,  BARD1, BLM, BMPR1A, BRCA1, BRCA2, BRIP1, CASR, CDC73, CDH1, CDK4, CDKN1B, CDKN1C, CDKN2A (p14ARF), CDKN2A (p16INK4a), CEBPA, CHEK2, CTNNA1, DICER1, DIS3L2, EGFR (c.2369C>T, p.Thr790Met variant onl   GERD (gastroesophageal reflux disease)    HEADACHE 12/06/2006   Qualifier: Diagnosis of  By: Arnoldo Morale MD, John E    Hemothorax on left 10/02/2020   History of adenomatous polyp of colon 11/19/2017   Hyperlipidemia    Hypertension    Hypothyroidism    IDA (iron deficiency anemia) 11/06/2019   Impaired fasting glucose 12/12/2017   Insomnia 08/20/2013   Irritable bowel syndrome 11/29/2006   Qualifier: Diagnosis of  By: Arnoldo Morale MD, John E    Left sided abdominal pain 12/13/2017   Liver hemangioma 08/24/2014   Malignant neoplasm of upper-outer quadrant of right breast in female, estrogen receptor positive ([place]) 03/20/2017   Migraines    Mild persistent asthma, uncomplicated 81/85/6314   Nasal polyp, unspecified 01/10/2018   Oral herpes 04/20/2013   Other allergic rhinitis 09/15/2014   Perforation of  right 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 ICD placement 10/02/2020   Personal history of radiation therapy 2019   36 radiation tx   PONV (postoperative nausea and vomiting)    scopolamine patch helps   Recurrent infections 10/05/2018   Right upper lobe pulmonary nodule 01/10/2018   Symptomatic varicose veins, right 09/02/2015   Ulcerative colitis ([place])    Varicose veins     Past Surgical History:  Procedure Laterality Date   BREAST BIOPSY Right 02/2017    BREAST BIOPSY Right 11/07/2019   BREAST LUMPECTOMY Right 04/24/2017   BREAST LUMPECTOMY WITH RADIOACTIVE SEED AND SENTINEL LYMPH NODE BIOPSY Right 04/24/2017   Procedure: RIGHT BREAST LUMPECTOMY WITH RADIOACTIVE SEED AND SENTINEL LYMPH NODE BIOPSY;  Surgeon: [name], MD;  Location: [facility];  Service: General;  Laterality: Right;   CORONARY ARTERY BYPASS GRAFT     crapal tunnel relase Left 05/2018   ENDOMETRIAL ABLATION  2010   Had abnormal uterine bleeding (heavy and frequent), performed in [place], menses stopped completely 2 years after   EXPLORATORY LAPAROTOMY     Had exploratory surgery at the age of 21 for abdominal pain with cyst found on her left ovary, not treated surgically   FOOT SURGERY Right 2012   GENERATOR REMOVAL  10/02/2020   Procedure: GENERATOR REMOVAL;  Surgeon: Rexene Alberts, MD;  Location: [facility];  Service: Thoracic;;   HAND SURGERY Right    ICD IMPLANT N/A 10/01/2020   Procedure: ICD IMPLANT;  Surgeon: Constance Haw, MD;  Location: [facility];  Service: Cardiovascular;  Laterality: N/A;   ICD LEAD REMOVAL N/A 10/02/2020   Procedure: ICD LEAD REMOVAL;  Surgeon: Rexene Alberts, MD;  Location: [facility];  Service: Thoracic;  Laterality: N/A;   MEDIASTERNOTOMY N/A 10/02/2020   Procedure: MEDIAN STERNOTOMY REPAIR PERFORATED VENTRICLE EVACUATION LEFT HEMOTHORAX;  Surgeon: Rexene Alberts, MD;  Location: [facility];  Service: Thoracic;  Laterality: N/A;   RE-EXCISION OF BREAST LUMPECTOMY Right 05/15/2017   Procedure: RE-EXCISION OF BREAST LUMPECTOMY;  Surgeon: [name], MD;  Location: [facility];  Service: General;  Laterality: Right;   RIGHT/LEFT HEART CATH AND CORONARY ANGIOGRAPHY N/A 06/21/2020   Procedure: RIGHT/LEFT HEART CATH AND CORONARY ANGIOGRAPHY;  Surgeon: Nelva Bush, MD;  Location: [facility];  Service: Cardiovascular;  Laterality: N/A;   ROBOTIC ASSISTED SALPINGO OOPHERECTOMY Bilateral  08/29/2019   Procedure: XI ROBOTIC ASSISTED SALPINGO OOPHORECTOMY;  Surgeon: [name], MD;  Location: [facility];  Service: Gynecology;  Laterality: Bilateral;   Septoplasty with turbinate reduction     TEE WITHOUT CARDIOVERSION N/A 10/02/2020   Procedure: TRANSESOPHAGEAL ECHOCARDIOGRAM (TEE);  Surgeon: Rexene Alberts, MD;  Location: [facility];  Service: Thoracic;  Laterality: N/A;   TONSILLECTOMY  1980   VEIN SURGERY     Removal Right Leg   WISDOM TOOTH EXTRACTION      Family History  Problem Relation Age of Onset   Arthritis Mother 59       Deceased   Breast cancer Mother    Lung cancer Mother    Hyperlipidemia Mother    Heart disease Mother    Hypertension Mother    Diabetes Mother    Crohn's disease Mother    Diabetes Maternal Grandfather    Heart disease Maternal Grandfather    Heart attack Maternal Grandfather    Leukemia Maternal Grandfather    Colitis Sister    Leukemia Cousin 3       mat cousin  Lung cancer Father    Brain cancer Father     Social History   Socioeconomic History   Marital status: Widowed    Spouse name: Not on file   Number of children: Not on file   Years of education: Not on file   Highest education level: Not on file  Occupational History   Not on file  Tobacco Use   Smoking status: Former    Packs/day: 3.00    Years: 15.00    Pack years: 45.00    Types: Cigarettes    Quit date: 09/14/1993    Years since quitting: 27.6   Smokeless tobacco: Never   Tobacco comments:    Quit 26 yrs ago  Vaping Use   Vaping Use: Never used  Substance and Sexual Activity   Alcohol use: Yes    Alcohol/week: 1.0 - 2.0 standard drink    Types: 1 - 2 Glasses of wine per week    Comment: Drinks a couple every 2-3 months   Drug use: No   Sexual activity: Not Currently    Comment: ablation  Other Topics Concern   Not on file  Social History Narrative   Not on file   Social Determinants of Health   Financial Resource  Strain: Not on file  Food Insecurity: Not on file  Transportation Needs: Not on file  Physical Activity: Not on file  Stress: Not on file  Social Connections: Not on file  Intimate Partner Violence: Not on file    Outpatient Medications Prior to Visit  Medication Sig Dispense Refill   acyclovir (ZOVIRAX) 400 MG tablet TAKE 1 TABLET (400 MG TOTAL) BY MOUTH 3 (THREE) TIMES DAILY AS NEEDED. 270 tablet 1   Adalimumab 80 MG/0.8ML PNKT Inject 80 mg into the skin every 14 (fourteen) days.     albuterol (VENTOLIN HFA) 108 (90 Base) MCG/ACT inhaler Inhale 2 puffs into the lungs every 6 (six) hours as needed for shortness of breath.     ascorbic acid (VITAMIN C) 1000 MG tablet Take 1,000 mg by mouth daily.     aspirin EC 81 MG tablet Take 81 mg by mouth daily. Swallow whole.     Azelastine HCl 0.15 % SOLN Place 2 sprays into both nostrils 2 (two) times daily. For 30 days     Calcium Carb-Cholecalciferol (CALCIUM+D3) 600-800 MG-UNIT TABS Take 1 tablet by mouth daily.     carvedilol (COREG) 12.5 MG tablet Take 1 tablet (12.5 mg total) by mouth 2 (two) times daily. 180 tablet 3   cetirizine (ZYRTEC) 10 MG tablet Take 10 mg by mouth daily.     dapagliflozin propanediol (FARXIGA) 10 MG TABS tablet Take 1 tablet (10 mg total) by mouth daily before breakfast. 30 tablet 11   diphenoxylate-atropine (LOMOTIL) 2.5-0.025 MG tablet Take 2 tablets by mouth 4 (four) times daily as needed for diarrhea or loose stools.     EPINEPHrine 0.3 mg/0.3 mL IJ SOAJ injection Inject 0.3 mg into the muscle as needed for anaphylaxis.     exemestane (AROMASIN) 25 MG tablet TAKE 1 TABLET (25 MG TOTAL) BY MOUTH DAILY AFTER BREAKFAST. 90 tablet 1   HYDROcodone-acetaminophen (NORCO/VICODIN) 5-325 MG tablet Take 1-2 tablets by mouth every 8 (eight) hours as needed for moderate pain or severe pain. 30 tablet 0   Hyoscyamine Sulfate 0.375 MG TBCR Take 0.375 mg by mouth in the morning and at bedtime.     lansoprazole (PREVACID SOLUTAB)  15 MG disintegrating tablet Take 15 mg by  mouth 2 (two) times daily before a meal.     levothyroxine (SYNTHROID) 75 MCG tablet Take 1 tablet (75 mcg total) by mouth daily before breakfast. 90 tablet 2   MAGNESIUM PO Take 250 mg by mouth daily.     meloxicam (MOBIC) 15 MG tablet Take 15 mg by mouth daily as needed for pain (Hip pain).     Menthol, Topical Analgesic, (BIOFREEZE COLORLESS) 4 % GEL Apply 1 application topically daily as needed (Hip pain).     mesalamine (LIALDA) 1.2 g EC tablet Take 2.4 g by mouth in the morning and at bedtime.      metFORMIN (GLUCOPHAGE) 500 MG tablet Take 1 tablet (500 mg total) by mouth 2 (two) times daily with a meal. 180 tablet 3   montelukast (SINGULAIR) 10 MG tablet Take 10 mg by mouth at bedtime.      Omega 3 1000 MG CAPS Take 2,000 mg by mouth every other day.     promethazine (PHENERGAN) 25 MG tablet Take 0.5-1 tablets (12.5-25 mg total) by mouth daily as needed for nausea or vomiting. 30 tablet 5   rosuvastatin (CRESTOR) 20 MG tablet TAKE 1 TABLET BY MOUTH EVERY DAY (Patient taking differently: Take 20 mg by mouth daily.) 90 tablet 1   sacubitril-valsartan (ENTRESTO) 97-103 MG Take 1 tablet by mouth 2 (two) times daily. 60 tablet 4   spironolactone (ALDACTONE) 25 MG tablet Take 0.5 tablets (12.5 mg total) by mouth daily. 45 tablet 3   SYMBICORT 160-4.5 MCG/ACT inhaler INHALE 2 PUFFS INTO THE LUNGS TWICE A DAY (Patient taking differently: Inhale 2 puffs into the lungs daily.) 30.6 each 1   triamcinolone (NASACORT) 55 MCG/ACT AERO nasal inhaler Place 2 sprays into the nose daily.     venlafaxine XR (EFFEXOR-XR) 150 MG 24 hr capsule TAKE 1 CAPSULE BY MOUTH DAILY WITH BREAKFAST. 90 capsule 1   zinc gluconate 50 MG tablet Take 50 mg by mouth daily.     zolpidem (AMBIEN) 5 MG tablet Take 1 tablet (5 mg total) by mouth at bedtime as needed for sleep. TAKE 1 TABLET BY MOUTH EVERY DAY AT BEDTIME AS NEEDED FOR SLEEP 30 tablet 3   No facility-administered medications  prior to visit.    Allergies  Allergen Reactions   Losartan Potassium Shortness Of Breath and Other (See Comments)   Ace Inhibitors Cough   Aspirin Other (See Comments)    Upset stomach   Cefaclor Hives and Other (See Comments)   Oxycodone Hives   Sulfa Antibiotics Hives   Lisinopril Cough and Other (See Comments)    Review of Systems All review of systems negative except what is listed in the HPI     Objective:    Physical Exam Vitals reviewed.  Constitutional:      Appearance: Normal appearance. She is obese.  HENT:     Head: Normocephalic and atraumatic.     Right Ear: Tympanic membrane normal.     Left Ear: Tympanic membrane normal.     Nose: Nose normal.     Mouth/Throat:     Mouth: Mucous membranes are moist.     Pharynx: Oropharynx is clear. No oropharyngeal exudate or posterior oropharyngeal erythema.  Eyes:     Extraocular Movements: Extraocular movements intact.     Conjunctiva/sclera: Conjunctivae normal.  Cardiovascular:     Rate and Rhythm: Normal rate and regular rhythm.     Pulses: Normal pulses.     Heart sounds: Normal heart sounds.  Pulmonary:  Effort: Pulmonary effort is normal.     Comments: Tight throughout, no wheezing Musculoskeletal:     Cervical back: Normal range of motion and neck supple. No tenderness.  Lymphadenopathy:     Cervical: No cervical adenopathy.  Skin:    General: Skin is warm and dry.  Neurological:     General: No focal deficit present.     Mental Status: She is alert and oriented to person, place, and time. Mental status is at baseline.  Psychiatric:        Mood and Affect: Mood normal.        Behavior: Behavior normal.        Thought Content: Thought content normal.        Judgment: Judgment normal.    BP (!) 99/59 (BP Location: Left Arm, Cuff Size: Normal)    Pulse 84    Temp 97.8 F (36.6 C) (Oral)    Resp 12    Ht 5' 3.5" (1.613 m)    Wt 161 lb 6.4 oz (73.2 kg)    SpO2 99%    BMI 28.14 kg/m  Wt Readings  from Last 3 Encounters:  05/09/21 161 lb 6.4 oz (73.2 kg)  04/04/21 158 lb 6.4 oz (71.8 kg)  03/16/21 156 lb 3.2 oz (70.9 kg)    Health Maintenance Due  Topic Date Due   OPHTHALMOLOGY EXAM  04/21/2021    There are no preventive care reminders to display for this patient.   Lab Results  Component Value Date   TSH 1.51 02/07/2021   Lab Results  Component Value Date   WBC 8.6 01/21/2021   HGB 14.2 01/21/2021   HCT 42.7 01/21/2021   MCV 89.1 01/21/2021   PLT 370 01/21/2021   Lab Results  Component Value Date   NA 132 (L) 04/19/2021   K 4.7 04/19/2021   CHLORIDE 105 03/28/2017   CO2 28 04/19/2021   GLUCOSE 111 (H) 04/19/2021   BUN 6 04/19/2021   CREATININE 0.72 04/19/2021   BILITOT 0.6 01/21/2021   ALKPHOS 91 01/21/2021   AST 12 (L) 01/21/2021   ALT 18 01/21/2021   PROT 7.1 01/21/2021   ALBUMIN 4.3 01/21/2021   CALCIUM 9.2 04/19/2021   ANIONGAP 7 04/19/2021   EGFR 101 03/30/2021   GFR 100.36 11/11/2020   Lab Results  Component Value Date   CHOL 128 08/10/2020   Lab Results  Component Value Date   HDL 37 (L) 08/10/2020   Lab Results  Component Value Date   LDLCALC 65 08/10/2020   Lab Results  Component Value Date   TRIG 153 (H) 08/10/2020   Lab Results  Component Value Date   CHOLHDL 3.5 08/10/2020   Lab Results  Component Value Date   HGBA1C 6.6 (H) 02/07/2021       Assessment & Plan:     1. Mild intermittent asthma with exacerbation 2. Non-seasonal allergic rhinitis, unspecified trigger Does not sound infectious at this time. Will try a short burst of steroids to see if this will improve her symptoms. Patient aware of signs/symptoms requiring further/urgent evaluation.   - predniSONE (DELTASONE) 20 MG tablet; Take 2 tablets (40 mg total) by mouth daily with breakfast for 5 days.  Dispense: 10 tablet; Refill: 0   Follow-up if symptoms worsen or fail to improve.   Terrilyn Saver, NP

## 2021-05-10 ENCOUNTER — Encounter: Payer: Self-pay | Admitting: Family Medicine

## 2021-05-11 DIAGNOSIS — C50919 Malignant neoplasm of unspecified site of unspecified female breast: Secondary | ICD-10-CM | POA: Insufficient documentation

## 2021-05-17 ENCOUNTER — Ambulatory Visit: Payer: No Typology Code available for payment source | Admitting: Cardiology

## 2021-05-17 ENCOUNTER — Other Ambulatory Visit: Payer: Self-pay

## 2021-05-17 ENCOUNTER — Encounter: Payer: Self-pay | Admitting: Cardiology

## 2021-05-17 VITALS — BP 102/66 | Ht 63.5 in | Wt 161.0 lb

## 2021-05-17 DIAGNOSIS — I420 Dilated cardiomyopathy: Secondary | ICD-10-CM | POA: Diagnosis not present

## 2021-05-17 DIAGNOSIS — Z170 Estrogen receptor positive status [ER+]: Secondary | ICD-10-CM

## 2021-05-17 DIAGNOSIS — I2510 Atherosclerotic heart disease of native coronary artery without angina pectoris: Secondary | ICD-10-CM | POA: Diagnosis not present

## 2021-05-17 DIAGNOSIS — C50411 Malignant neoplasm of upper-outer quadrant of right female breast: Secondary | ICD-10-CM | POA: Diagnosis not present

## 2021-05-17 NOTE — Patient Instructions (Signed)
Medication Instructions:  Your physician recommends that you continue on your current medications as directed. Please refer to the Current Medication list given to you today.  *If you need a refill on your cardiac medications before your next appointment, please call your pharmacy*   Lab Work:Your physician recommends that you return for lab work in: Labs today ( [facility] )  If you have labs (blood work) drawn today and your tests are completely normal, you will receive your results only by: [place] (if you have MyChart) OR A paper copy in the mail If you have any lab test that is abnormal or we need to change your treatment, we will call you to review the results.   Testing/Procedures: None   Follow-Up: At [facility], you and your health needs are our priority.  As part of our continuing mission to provide you with exceptional heart care, we have created designated Provider Care Teams.  These Care Teams include your primary Cardiologist (physician) and Advanced Practice Providers (APPs -  Physician Assistants and Nurse Practitioners) who all work together to provide you with the care you need, when you need it.  We recommend signing up for the patient portal called "MyChart".  Sign up information is provided on this After Visit Summary.  MyChart is used to connect with patients for Virtual Visits (Telemedicine).  Patients are able to view lab/test results, encounter notes, upcoming appointments, etc.  Non-urgent messages can be sent to your provider as well.   To learn more about what you can do with MyChart, go to [email or web address].    Your next appointment:   4 month(s)  The format for your next appointment:   In Person  Provider:   Jenne Campus, MD    Other Instructions

## 2021-05-17 NOTE — Progress Notes (Signed)
Cardiology Office Note:    Date:  05/17/2021   ID:  Erin Fields, DOB [birth year], MRN [id number]  PCP:  Darreld Mclean, MD  Cardiologist:  Erin Campus, MD    Referring MD: Darreld Mclean, MD   No chief complaint on file.   History of Present Illness:    Erin Fields is a 56 y.o. female  with complex past medical history.  She was initially referred to [place] because of EKG being abnormal that was done before elective hip replacement surgery after that echocardiogram has been performed and surprising she was found to have 25% ejection fraction which is severely diminished, that was followed by cardiac catheterization which showed nonobstructive disease she was put on appropriate medication in spite of that her ejection fraction was still diminished, she was referred to our EP team for consideration for ICD.  ICD was implanted and everything was fine she left home however while at home she started having chest pain.  She ended going to the emergency room thinking that she may have discharged from the defibrillator, device has been interrogated no discharge has being identified she was discharged home, however presented back within the next few hours with shortness of breath.  She suffered from perforation of the right ventricle with injury of the artery leading to blood collection in the pleura.  She required open heart surgery with repair of the perforation of the right ventricle as well as evacuation of hematoma from the left pleura.  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  Overall she seems to be doing well.  She denies have any chest pain tightness squeezing pressure burning chest.  She supposed to have hip replacement surgery done however surgery has been consulted because some issue no seizure.  Reports pain is still in the hip and she is counting days when she  will be able to have her surgery done which probably can happen end of June or beginning of July this year.  Denies have any palpitations no dizziness no passing out.  Past Medical History:  Diagnosis Date   Allergic rhinitis due to animal (cat) (dog) hair and dander 04/27/2020   Allergic rhinitis due to pollen 04/27/2020   Anxiety    Anxiety and depression 01/20/2018   Asthma    Asthma, persistent controlled 10/05/2018   B12 deficiency 11/27/2019   Borderline diabetes 12/17/2015   Breast cancer ([place])    Cancer ([place]) 2019   right breast cancer   CERVICAL CANCER, HX OF 12/06/2006   Qualifier: Diagnosis of  By: Arnoldo Morale MD, John E    Chronic sinusitis 01/26/2015   Chronic venous insufficiency 09/02/2015   Colon polyps    Congestive heart failure ([place]) 04/29/2020   Controlled type 2 diabetes mellitus without complication, without long-term current use of insulin ([place]) 05/28/2017   Coronary artery disease 25% of RCA, 20% intermediate branch, 40% diagonal branch based on cardiac cath from 2022 07/22/2020   Crohn disease ([place]) 11/29/2016   Depression    Diabetes mellitus without complication ([place])    type 2   Dilated cardiomyopathy ([place]) ejection fraction 25% by echocardiogram from January 2022 04/29/2020   Dyspnea 09/15/2014   Elevated triglycerides with high cholesterol 11/26/2013   Environmental allergies    Eustachian tube dysfunction 12/04/2014   Ex-smoker 11/18/2015   Family history of breast cancer    Genetic  testing 07/16/2017   Negative genetic testing on the multi-cancer panel.  The Multi-Gene Panel offered by [name] includes sequencing and/or deletion duplication testing of the following 83 genes: ALK, APC, ATM, AXIN2,BAP1,  BARD1, BLM, BMPR1A, BRCA1, BRCA2, BRIP1, CASR, CDC73, CDH1, CDK4, CDKN1B, CDKN1C, CDKN2A (p14ARF), CDKN2A (p16INK4a), CEBPA, CHEK2, CTNNA1, DICER1, DIS3L2, EGFR (c.2369C>T, p.Thr790Met variant onl   GERD (gastroesophageal reflux disease)    HEADACHE  12/06/2006   Qualifier: Diagnosis of  By: Arnoldo Morale MD, John E    Hemothorax on left 10/02/2020   History of adenomatous polyp of colon 11/19/2017   Hyperlipidemia    Hypertension    Hypothyroidism    IDA (iron deficiency anemia) 11/06/2019   Impaired fasting glucose 12/12/2017   Insomnia 08/20/2013   Irritable bowel syndrome 11/29/2006   Qualifier: Diagnosis of  By: Arnoldo Morale MD, John E    Left sided abdominal pain 12/13/2017   Liver hemangioma 08/24/2014   Malignant neoplasm of upper-outer quadrant of right breast in female, estrogen receptor positive ([place]) 03/20/2017   Migraines    Mild persistent asthma, uncomplicated 97/35/3299   Nasal polyp, unspecified 01/10/2018   Oral herpes 04/20/2013   Other allergic rhinitis 09/15/2014   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 10/02/2020   Personal history of radiation therapy 2019   36 radiation tx   PONV (postoperative nausea and vomiting)    scopolamine patch helps   Recurrent infections 10/05/2018   Right upper lobe pulmonary nodule 01/10/2018   Symptomatic varicose veins, right 09/02/2015   Ulcerative colitis ([place])    Varicose veins     Past Surgical History:  Procedure Laterality Date   BREAST BIOPSY Right 02/2017   BREAST BIOPSY Right 11/07/2019   BREAST LUMPECTOMY Right 04/24/2017   BREAST LUMPECTOMY WITH RADIOACTIVE SEED AND SENTINEL LYMPH NODE BIOPSY Right 04/24/2017   Procedure: RIGHT BREAST LUMPECTOMY WITH RADIOACTIVE SEED AND SENTINEL LYMPH NODE BIOPSY;  Surgeon: [name], MD;  Location: [facility];  Service: General;  Laterality: Right;   CORONARY ARTERY BYPASS GRAFT     crapal tunnel relase Left 05/2018   ENDOMETRIAL ABLATION  2010   Had abnormal uterine bleeding (heavy and frequent), performed in [place], menses stopped completely 2 years after   EXPLORATORY LAPAROTOMY     Had exploratory surgery at the age of 14 for abdominal pain with cyst found on her left ovary, not treated  surgically   FOOT SURGERY Right 2012   GENERATOR REMOVAL  10/02/2020   Procedure: GENERATOR REMOVAL;  Surgeon: Rexene Alberts, MD;  Location: [facility];  Service: Thoracic;;   HAND SURGERY Right    ICD IMPLANT N/A 10/01/2020   Procedure: ICD IMPLANT;  Surgeon: Constance Haw, MD;  Location: [facility];  Service: Cardiovascular;  Laterality: N/A;   ICD LEAD REMOVAL N/A 10/02/2020   Procedure: ICD LEAD REMOVAL;  Surgeon: Rexene Alberts, MD;  Location: [facility];  Service: Thoracic;  Laterality: N/A;   MEDIASTERNOTOMY N/A 10/02/2020   Procedure: MEDIAN STERNOTOMY REPAIR PERFORATED VENTRICLE EVACUATION LEFT HEMOTHORAX;  Surgeon: Rexene Alberts, MD;  Location: [facility];  Service: Thoracic;  Laterality: N/A;   RE-EXCISION OF BREAST LUMPECTOMY Right 05/15/2017   Procedure: RE-EXCISION OF BREAST LUMPECTOMY;  Surgeon: [name], MD;  Location: [facility];  Service: General;  Laterality: Right;   RIGHT/LEFT HEART CATH AND CORONARY ANGIOGRAPHY N/A 06/21/2020   Procedure: RIGHT/LEFT HEART CATH AND CORONARY ANGIOGRAPHY;  Surgeon: Nelva Bush, MD;  Location: [facility];  Service: Cardiovascular;  Laterality: N/A;   ROBOTIC ASSISTED SALPINGO OOPHERECTOMY Bilateral 08/29/2019   Procedure: XI ROBOTIC ASSISTED SALPINGO OOPHORECTOMY;  Surgeon: [name], MD;  Location[facility];  Service: Gynecology;  Laterality: Bilateral;   Septoplasty with turbinate reduction     TEE WITHOUT CARDIOVERSION N/A 10/02/2020   Procedure: TRANSESOPHAGEAL ECHOCARDIOGRAM (TEE);  Surgeon: Rexene Alberts, MD;  Location[facility];  Service: Thoracic;  Laterality: N/A;   TONSILLECTOMY  1980   VEIN SURGERY     Removal Right Leg   WISDOM TOOTH EXTRACTION      Current Medications: Current Meds  Medication Sig   acyclovir (ZOVIRAX) 400 MG tablet TAKE 1 TABLET (400 MG TOTAL) BY MOUTH 3 (THREE) TIMES DAILY AS NEEDED.   Adalimumab 80 MG/0.8ML PNKT Inject 80 mg into the  skin every 14 (fourteen) days.   albuterol (VENTOLIN HFA) 108 (90 Base) MCG/ACT inhaler Inhale 2 puffs into the lungs every 6 (six) hours as needed for shortness of breath.   ascorbic acid (VITAMIN C) 1000 MG tablet Take 1,000 mg by mouth daily.   aspirin EC 81 MG tablet Take 81 mg by mouth daily. Swallow whole.   Azelastine HCl 0.15 % SOLN Place 2 sprays into both nostrils 2 (two) times daily. For 30 days   Calcium Carb-Cholecalciferol (CALCIUM+D3) 600-800 MG-UNIT TABS Take 1 tablet by mouth daily.   carvedilol (COREG) 12.5 MG tablet Take 1 tablet (12.5 mg total) by mouth 2 (two) times daily.   cetirizine (ZYRTEC) 10 MG tablet Take 10 mg by mouth daily.   dapagliflozin propanediol (FARXIGA) 10 MG TABS tablet Take 1 tablet (10 mg total) by mouth daily before breakfast.   diphenoxylate-atropine (LOMOTIL) 2.5-0.025 MG tablet Take 2 tablets by mouth 4 (four) times daily as needed for diarrhea or loose stools.   EPINEPHrine 0.3 mg/0.3 mL IJ SOAJ injection Inject 0.3 mg into the muscle as needed for anaphylaxis.   exemestane (AROMASIN) 25 MG tablet TAKE 1 TABLET (25 MG TOTAL) BY MOUTH DAILY AFTER BREAKFAST.   HYDROcodone-acetaminophen (NORCO/VICODIN) 5-325 MG tablet Take 1-2 tablets by mouth every 8 (eight) hours as needed for moderate pain or severe pain.   Hyoscyamine Sulfate 0.375 MG TBCR Take 0.375 mg by mouth in the morning and at bedtime.   lansoprazole (PREVACID SOLUTAB) 15 MG disintegrating tablet Take 15 mg by mouth 2 (two) times daily before a meal.   levothyroxine (SYNTHROID) 75 MCG tablet Take 1 tablet (75 mcg total) by mouth daily before breakfast.   MAGNESIUM PO Take 250 mg by mouth daily.   meloxicam (MOBIC) 15 MG tablet Take 15 mg by mouth daily as needed for pain (Hip pain).   Menthol, Topical Analgesic, (BIOFREEZE COLORLESS) 4 % GEL Apply 1 application topically daily as needed (Hip pain).   mesalamine (LIALDA) 1.2 g EC tablet Take 2.4 g by mouth in the morning and at bedtime.     metFORMIN (GLUCOPHAGE) 500 MG tablet Take 1 tablet (500 mg total) by mouth 2 (two) times daily with a meal.   montelukast (SINGULAIR) 10 MG tablet Take 10 mg by mouth at bedtime.    Omega 3 1000 MG CAPS Take 2,000 mg by mouth every other day.   promethazine (PHENERGAN) 25 MG tablet Take 0.5-1 tablets (12.5-25 mg total) by mouth daily as needed for nausea or vomiting.   rosuvastatin (CRESTOR) 20 MG tablet TAKE 1 TABLET BY MOUTH EVERY DAY (Patient taking differently: Take 20 mg by mouth daily.)   sacubitril-valsartan (ENTRESTO)  97-103 MG Take 1 tablet by mouth 2 (two) times daily.   spironolactone (ALDACTONE) 25 MG tablet Take 0.5 tablets (12.5 mg total) by mouth daily.   SYMBICORT 160-4.5 MCG/ACT inhaler INHALE 2 PUFFS INTO THE LUNGS TWICE A DAY (Patient taking differently: Inhale 2 puffs into the lungs daily.)   triamcinolone (NASACORT) 55 MCG/ACT AERO nasal inhaler Place 2 sprays into the nose daily.   venlafaxine XR (EFFEXOR-XR) 150 MG 24 hr capsule TAKE 1 CAPSULE BY MOUTH DAILY WITH BREAKFAST.   zinc gluconate 50 MG tablet Take 50 mg by mouth daily.   zolpidem (AMBIEN) 5 MG tablet Take 1 tablet (5 mg total) by mouth at bedtime as needed for sleep. TAKE 1 TABLET BY MOUTH EVERY DAY AT BEDTIME AS NEEDED FOR SLEEP     Allergies:   Losartan potassium, Ace inhibitors, Aspirin, Cefaclor, Oxycodone, Sulfa antibiotics, and Lisinopril   Social History   Socioeconomic History   Marital status: Widowed    Spouse name: Not on file   Number of children: Not on file   Years of education: Not on file   Highest education level: Not on file  Occupational History   Not on file  Tobacco Use   Smoking status: Former    Packs/day: 3.00    Years: 15.00    Pack years: 45.00    Types: Cigarettes    Quit date: 09/14/1993    Years since quitting: 27.6   Smokeless tobacco: Never   Tobacco comments:    Quit 26 yrs ago  Vaping Use   Vaping Use: Never used  Substance and Sexual Activity   Alcohol use: Yes     Alcohol/week: 1.0 - 2.0 standard drink    Types: 1 - 2 Glasses of wine per week    Comment: Drinks a couple every 2-3 months   Drug use: No   Sexual activity: Not Currently    Comment: ablation  Other Topics Concern   Not on file  Social History Narrative   Not on file   Social Determinants of Health   Financial Resource Strain: Not on file  Food Insecurity: Not on file  Transportation Needs: Not on file  Physical Activity: Not on file  Stress: Not on file  Social Connections: Not on file     Family History: The patient's family history includes Arthritis (age of onset: 27) in her mother; Brain cancer in her father; Breast cancer in her mother; Colitis in her sister; Crohn's disease in her mother; Diabetes in her maternal grandfather and mother; Heart attack in her maternal grandfather; Heart disease in her maternal grandfather and mother; Hyperlipidemia in her mother; Hypertension in her mother; Leukemia in her maternal grandfather; Leukemia (age of onset: 58) in her cousin; Lung cancer in her father and mother. ROS:   Please see the history of present illness.    All 14 point review of systems negative except as described per history of present illness  EKGs/Labs/Other Studies Reviewed:      Recent Labs: 11/11/2020: Magnesium 1.6 01/21/2021: ALT 18; Hemoglobin 14.2; Platelet Count 370 02/07/2021: TSH 1.51 03/04/2021: B Natriuretic Peptide 18.2 04/19/2021: BUN 6; Creatinine, Ser 0.72; Potassium 4.7; Sodium 132  Recent Lipid Panel    Component Value Date/Time   CHOL 128 08/10/2020 0936   TRIG 153 (H) 08/10/2020 0936   HDL 37 (L) 08/10/2020 0936   CHOLHDL 3.5 08/10/2020 0936   CHOLHDL 4 06/18/2019 0952   VLDL 31.8 06/18/2019 0952   LDLCALC 65 08/10/2020 0936  LDLDIRECT 101.0 12/02/2018 0955    Physical Exam:    VS:  BP 102/66 (BP Location: Left Arm)    Ht 5' 3.5" (1.613 m)    Wt 161 lb (73 kg)    SpO2 99%    BMI 28.07 kg/m     Wt Readings from Last 3 Encounters:   05/17/21 161 lb (73 kg)  05/09/21 161 lb 6.4 oz (73.2 kg)  04/04/21 158 lb 6.4 oz (71.8 kg)     GEN:  Well nourished, well developed in no acute distress HEENT: Normal NECK: No JVD; No carotid bruits LYMPHATICS: No lymphadenopathy CARDIAC: RRR, no murmurs, no rubs, no gallops RESPIRATORY:  Clear to auscultation without rales, wheezing or rhonchi  ABDOMEN: Soft, non-tender, non-distended MUSCULOSKELETAL:  No edema; No deformity  SKIN: Warm and dry LOWER EXTREMITIES: no swelling NEUROLOGIC:  Alert and oriented x 3 PSYCHIATRIC:  Normal affect   ASSESSMENT:    1. Dilated cardiomyopathy (HCC) ejection fraction 25% by echocardiogram from January 2022   2. Coronary artery disease involving native coronary artery of native heart without angina pectoris   3. Malignant neoplasm of upper-outer quadrant of right breast in female, estrogen receptor positive ([place])    PLAN:    In order of problems listed above:  Cardiomyopathy which is compensated on physical exam today.  She is on appropriate medications.  There was an issue with her having high per kalemia.  Beta-blocker carvedilol has been lowered and that seems to improve the situation I will ask her to have Chem-7 done today.  I will not change any of her medications. Coronary disease.  She is on aspirin which I will continue, she is on Crestor 20 which I will continue.  She is asymptomatic continue present management. Dyslipidemia I did review her K PN which show me her LDL of 65 HDL 37.  Good control continue statin. Need for hip replacement surgery again surgery has been postponed until summer of this year because of insurance issues.   Medication Adjustments/Labs and Tests Ordered: Current medicines are reviewed at length with the patient today.  Concerns regarding medicines are outlined above.  No orders of the defined types were placed in this encounter.  Medication changes: No orders of the defined types were placed in this  encounter.   Signed, Park Liter, MD, [facility] 05/17/2021 3:28 PM    [place]

## 2021-05-18 LAB — BASIC METABOLIC PANEL
BUN/Creatinine Ratio: 10 (ref 9–23)
BUN: 7 mg/dL (ref 6–24)
CO2: 22 mmol/L (ref 20–29)
Calcium: 9.8 mg/dL (ref 8.7–10.2)
Chloride: 94 mmol/L — ABNORMAL LOW (ref 96–106)
Creatinine, Ser: 0.71 mg/dL (ref 0.57–1.00)
Glucose: 118 mg/dL — ABNORMAL HIGH (ref 70–99)
Potassium: 5.1 mmol/L (ref 3.5–5.2)
Sodium: 134 mmol/L (ref 134–144)
eGFR: 98 mL/min/{1.73_m2} (ref >59)

## 2021-05-30 ENCOUNTER — Encounter (HOSPITAL_COMMUNITY): Payer: Self-pay | Admitting: Cardiology

## 2021-05-30 ENCOUNTER — Emergency Department (HOSPITAL_BASED_OUTPATIENT_CLINIC_OR_DEPARTMENT_OTHER)
Admission: EM | Admit: 2021-05-30 | Discharge: 2021-05-30 | Disposition: A | Discharge status: Home / Self Care | Payer: No Typology Code available for payment source | Attending: Emergency Medicine | Admitting: Emergency Medicine

## 2021-05-30 ENCOUNTER — Other Ambulatory Visit: Payer: Self-pay

## 2021-05-30 ENCOUNTER — Encounter (HOSPITAL_BASED_OUTPATIENT_CLINIC_OR_DEPARTMENT_OTHER): Payer: Self-pay

## 2021-05-30 DIAGNOSIS — Z79899 Other long term (current) drug therapy: Secondary | ICD-10-CM | POA: Insufficient documentation

## 2021-05-30 DIAGNOSIS — Z7982 Long term (current) use of aspirin: Secondary | ICD-10-CM | POA: Diagnosis not present

## 2021-05-30 DIAGNOSIS — E119 Type 2 diabetes mellitus without complications: Secondary | ICD-10-CM | POA: Diagnosis not present

## 2021-05-30 DIAGNOSIS — S6992XA Unspecified injury of left wrist, hand and finger(s), initial encounter: Secondary | ICD-10-CM | POA: Diagnosis present

## 2021-05-30 DIAGNOSIS — S61012A Laceration without foreign body of left thumb without damage to nail, initial encounter: Secondary | ICD-10-CM | POA: Diagnosis not present

## 2021-05-30 DIAGNOSIS — W260XXA Contact with knife, initial encounter: Secondary | ICD-10-CM | POA: Diagnosis not present

## 2021-05-30 DIAGNOSIS — I10 Essential (primary) hypertension: Secondary | ICD-10-CM | POA: Insufficient documentation

## 2021-05-30 NOTE — ED Triage Notes (Signed)
Patient states about 30 minutes prior to arrival she was cutting up an apple with a knive and cut L thumb.  Bleeding controlled with guaze in triage.

## 2021-05-30 NOTE — ED Provider Notes (Signed)
[facility] EMERGENCY DEPARTMENT Provider Note   CSN: [id number] Arrival date & time: 05/30/21  1937     History  Chief Complaint  Patient presents with   Extremity Laceration    Erin Fields is a 56 y.o. female.  With past medical history of hypertension, cardiomyopathy, GERD, diabetes who presents to the emergency department with laceration.  Patient states that tonight she was cutting vegetables when she accidentally cut her left thumb.  She states that she was unable to get the bleeding to stop so she presents to the emergency department.  She is not anticoagulated.  Last tetanus shot 3 years ago.  HPI     Home Medications Prior to Admission medications   Medication Sig Start Date End Date Taking? Authorizing Provider  acyclovir (ZOVIRAX) 400 MG tablet TAKE 1 TABLET (400 MG TOTAL) BY MOUTH 3 (THREE) TIMES DAILY AS NEEDED. 09/24/19   Copland, Gay Filler, MD  Adalimumab 80 MG/0.8ML PNKT Inject 80 mg into the skin every 14 (fourteen) days. 12/01/19   [provider]  albuterol (VENTOLIN HFA) 108 (90 Base) MCG/ACT inhaler Inhale 2 puffs into the lungs every 6 (six) hours as needed for shortness of breath. 09/22/20   [provider]  ascorbic acid (VITAMIN C) 1000 MG tablet Take 1,000 mg by mouth daily.    [provider]  aspirin EC 81 MG tablet Take 81 mg by mouth daily. Swallow whole.    [provider]  Azelastine HCl 0.15 % SOLN Place 2 sprays into both nostrils 2 (two) times daily. For 30 days 07/16/20   [provider]  Calcium Carb-Cholecalciferol (CALCIUM+D3) 600-800 MG-UNIT TABS Take 1 tablet by mouth daily.    [provider]  carvedilol (COREG) 12.5 MG tablet Take 1 tablet (12.5 mg total) by mouth 2 (two) times daily. 04/19/21   Larey Dresser, MD  cetirizine (ZYRTEC) 10 MG tablet Take 10 mg by mouth daily.    [provider]  dapagliflozin propanediol (FARXIGA) 10 MG TABS tablet Take 1 tablet (10 mg  total) by mouth daily before breakfast. 12/13/20   Larey Dresser, MD  diphenoxylate-atropine (LOMOTIL) 2.5-0.025 MG tablet Take 2 tablets by mouth 4 (four) times daily as needed for diarrhea or loose stools.    [provider]  EPINEPHrine 0.3 mg/0.3 mL IJ SOAJ injection Inject 0.3 mg into the muscle as needed for anaphylaxis.    [provider]  exemestane (AROMASIN) 25 MG tablet TAKE 1 TABLET (25 MG TOTAL) BY MOUTH DAILY AFTER BREAKFAST. 04/28/21   [name], MD  HYDROcodone-acetaminophen (NORCO/VICODIN) 5-325 MG tablet Take 1-2 tablets by mouth every 8 (eight) hours as needed for moderate pain or severe pain. 04/27/21   Copland, Gay Filler, MD  Hyoscyamine Sulfate 0.375 MG TBCR Take 0.375 mg by mouth in the morning and at bedtime.    [provider]  lansoprazole (PREVACID SOLUTAB) 15 MG disintegrating tablet Take 15 mg by mouth 2 (two) times daily before a meal.    [provider]  levothyroxine (SYNTHROID) 75 MCG tablet Take 1 tablet (75 mcg total) by mouth daily before breakfast. 04/27/21   Copland, Gay Filler, MD  MAGNESIUM PO Take 250 mg by mouth daily.    [provider]  meloxicam (MOBIC) 15 MG tablet Take 15 mg by mouth daily as needed for pain (Hip pain).    [provider]  Menthol, Topical Analgesic, (BIOFREEZE COLORLESS) 4 % GEL Apply 1 application topically daily as needed (  Hip pain).    [provider]  mesalamine (LIALDA) 1.2 g EC tablet Take 2.4 g by mouth in the morning and at bedtime.     [provider]  metFORMIN (GLUCOPHAGE) 500 MG tablet Take 1 tablet (500 mg total) by mouth 2 (two) times daily with a meal. 11/05/20   Copland, Gay Filler, MD  montelukast (SINGULAIR) 10 MG tablet Take 10 mg by mouth at bedtime.     [provider]  Omega 3 1000 MG CAPS Take 2,000 mg by mouth every other day.    [provider]  promethazine (PHENERGAN) 25 MG tablet Take 0.5-1 tablets (12.5-25 mg total) by mouth  daily as needed for nausea or vomiting. 04/13/20   Copland, Gay Filler, MD  rosuvastatin (CRESTOR) 20 MG tablet TAKE 1 TABLET BY MOUTH EVERY DAY Patient taking differently: Take 20 mg by mouth daily. 02/21/21   Copland, Gay Filler, MD  sacubitril-valsartan (ENTRESTO) 97-103 MG Take 1 tablet by mouth 2 (two) times daily. 03/04/21   Larey Dresser, MD  spironolactone (ALDACTONE) 25 MG tablet Take 0.5 tablets (12.5 mg total) by mouth daily. 04/04/21   Larey Dresser, MD  SYMBICORT 160-4.5 MCG/ACT inhaler INHALE 2 PUFFS INTO THE LUNGS TWICE A DAY Patient taking differently: Inhale 2 puffs into the lungs daily. 03/03/21   Saguier, Percell Miller, PA-C  triamcinolone (NASACORT) 55 MCG/ACT AERO nasal inhaler Place 2 sprays into the nose daily.    [provider]  venlafaxine XR (EFFEXOR-XR) 150 MG 24 hr capsule TAKE 1 CAPSULE BY MOUTH DAILY WITH BREAKFAST. 05/04/21   Copland, Gay Filler, MD  zinc gluconate 50 MG tablet Take 50 mg by mouth daily.    [provider]  zolpidem (AMBIEN) 5 MG tablet Take 1 tablet (5 mg total) by mouth at bedtime as needed for sleep. TAKE 1 TABLET BY MOUTH EVERY DAY AT BEDTIME AS NEEDED FOR SLEEP 04/12/21   Copland, Gay Filler, MD      Allergies    Losartan potassium, Ace inhibitors, Aspirin, Cefaclor, Oxycodone, Sulfa antibiotics, and Lisinopril    Review of Systems   Review of Systems  Skin:  Positive for wound.  Neurological:  Negative for dizziness and light-headedness.  All other systems reviewed and are negative.  Physical Exam Updated Vital Signs BP 124/74 (BP Location: Right Arm)    Pulse 72    Temp 98.2 F (36.8 C) (Oral)    Resp 18    Ht 5' 3"  (1.6 m)    Wt 72.6 kg    SpO2 98%    BMI 28.34 kg/m  Physical Exam Vitals and nursing note reviewed.  Constitutional:      General: She is not in acute distress.    Appearance: Normal appearance.  HENT:     Head: Normocephalic and atraumatic.  Eyes:     General: No scleral icterus. Cardiovascular:      Pulses: Normal pulses.  Pulmonary:     Effort: Pulmonary effort is normal. No respiratory distress.  Musculoskeletal:        General: Normal range of motion.  Skin:    General: Skin is warm and dry.     Capillary Refill: Capillary refill takes less than 2 seconds.     Findings: Laceration present. No rash.     Comments: < 1cm superficial laceration to the left thumb.  Hemostatic.  Neurological:     General: No focal deficit present.     Mental Status: She is alert and oriented to  person, place, and time. Mental status is at baseline.  Psychiatric:        Mood and Affect: Mood normal.        Behavior: Behavior normal.        Thought Content: Thought content normal.        Judgment: Judgment normal.    ED Results / Procedures / Treatments   Labs (all labs ordered are listed, but only abnormal results are displayed) Labs Reviewed - No data to display  EKG None  Radiology No results found.  Procedures .Marland KitchenLaceration Repair  Date/Time: 05/30/2021 10:33 PM Performed by: Mickie Hillier, PA-C Authorized by: Mickie Hillier, PA-C   Consent:    Consent obtained:  Verbal   Consent given by:  Patient   Risks, benefits, and alternatives were discussed: yes     Risks discussed:  Infection, poor cosmetic result and poor wound healing   Alternatives discussed:  No treatment, delayed treatment and observation Universal protocol:    Procedure explained and questions answered to patient or proxy's satisfaction: yes     Relevant documents present and verified: yes     Test results available: yes     Imaging studies available: no     Required blood products, implants, devices, and special equipment available: yes     Site/side marked: yes     Immediately prior to procedure, a time out was called: yes     Patient identity confirmed:  Verbally with patient Anesthesia:    Anesthesia method:  None Laceration details:    Location:  Finger   Finger location:  L thumb   Length (cm):   0.5   Depth (mm):  0 Pre-procedure details:    Preparation:  Patient was prepped and draped in usual sterile fashion Exploration:    Limited defect created (wound extended): no     Hemostasis achieved with:  Direct pressure   Wound exploration: wound explored through full range of motion and entire depth of wound visualized     Contaminated: no   Treatment:    Area cleansed with:  Soap and water   Amount of cleaning:  Standard   Irrigation volume:  50   Irrigation method:  Tap   Visualized foreign bodies/material removed: yes     Debridement:  None   Undermining:  None Skin repair:    Repair method:  Tissue adhesive Approximation:    Approximation:  Close Repair type:    Repair type:  Simple Post-procedure details:    Dressing:  Open (no dressing)   Procedure completion:  Tolerated   Medications Ordered in ED Medications - No data to display  ED Course/ Medical Decision Making/ A&P                           Medical Decision Making Patient presents to the ED with complaints of laceration. This involves an extensive number of treatment options, and is a complaint that carries with it a low risk of complications and morbidity.   Additional history obtained:  Additional history obtained from: Son at bedside External records from outside source obtained and reviewed including: None required  Medications  I ordered medication including Dermabond for wound closure Reevaluation of the patient after medication shows that patient resolved  ED Course: 56 year old female who presents emergency department with minor laceration to the left thumb.  No other injuries.  Not anticoagulated.  Tetanus less than 3 years.  Hemostatic wound on  exam.  It is very superficial however she would like some Dermabond to keep it closed.  I have cleaned the wound appropriately.  It does not require any imaging to look for foreign bodies or fractures.  Tetanus shot not required as she recently had it  in the last 3 years.  I placed Dermabond over the wound which is provided closure.  She tolerated this well.  Discussed tissue adhesive care and I have added these care instructions to her discharge paperwork.  I have answered all of her questions at this time.  She does not require follow-up.  Should return to the emergency department for bleeding through the Dermabond that she is unable to control.  Discuss Motrin for any pain.  After consideration of the diagnostic results and the patients response to treatment, I feel that the patent would benefit from discharge. The patient has been appropriately medically screened and/or stabilized in the ED. I have low suspicion for any other emergent medical condition which would require further screening, evaluation or treatment in the ED or require inpatient management. The patient is overall well appearing and non-toxic in appearance. They are hemodynamically stable at time of discharge.   Final Clinical Impression(s) / ED Diagnoses Final diagnoses:  Laceration of left thumb without foreign body without damage to nail, initial encounter    Rx / DC Orders ED Discharge Orders     None         Mickie Hillier, PA-C 06/07/21 1036    Wynona Dove A, DO 06/08/21 1255

## 2021-05-30 NOTE — ED Notes (Signed)
Small laceration to tip of left thumb, bleeding controlled

## 2021-05-30 NOTE — Discharge Instructions (Addendum)
You were seen in the emergency department today for a cut to your left thumb after cutting vegetables.  This did not require sutures.  Did required some Dermabond which is a tissue adhesive.  I provided care instructions in your discharge paperwork regarding this.  This will fall off over time.  You do not need follow-up in the emergency department or primary care for this.  Please return for chest pain, shortness of breath, difficulty breathing or other emergencies.

## 2021-06-07 ENCOUNTER — Encounter: Payer: Self-pay | Admitting: Family Medicine

## 2021-06-07 MED ORDER — HYDROCODONE-ACETAMINOPHEN 5-325 MG PO TABS: 1.0000 | ORAL_TABLET | Freq: Three times a day (TID) | ORAL | 0 refills | Status: DC | PRN

## 2021-06-08 ENCOUNTER — Ambulatory Visit (HOSPITAL_COMMUNITY)
Admission: RE | Admit: 2021-06-08 | Discharge: 2021-06-08 | Disposition: A | Discharge status: Home / Self Care | Payer: No Typology Code available for payment source | Source: Ambulatory Visit | Attending: Cardiology | Admitting: Cardiology

## 2021-06-08 ENCOUNTER — Ambulatory Visit (HOSPITAL_BASED_OUTPATIENT_CLINIC_OR_DEPARTMENT_OTHER)
Admission: RE | Admit: 2021-06-08 | Discharge: 2021-06-08 | Disposition: A | Discharge status: Home / Self Care | Payer: No Typology Code available for payment source | Source: Ambulatory Visit | Attending: Cardiology | Admitting: Cardiology

## 2021-06-08 ENCOUNTER — Other Ambulatory Visit: Payer: Self-pay

## 2021-06-08 ENCOUNTER — Encounter (HOSPITAL_COMMUNITY): Payer: Self-pay | Admitting: Cardiology

## 2021-06-08 VITALS — BP 100/52 | HR 73 | Wt 162.2 lb

## 2021-06-08 DIAGNOSIS — C50919 Malignant neoplasm of unspecified site of unspecified female breast: Secondary | ICD-10-CM | POA: Diagnosis not present

## 2021-06-08 DIAGNOSIS — Z95810 Presence of automatic (implantable) cardiac defibrillator: Secondary | ICD-10-CM | POA: Insufficient documentation

## 2021-06-08 DIAGNOSIS — Z79899 Other long term (current) drug therapy: Secondary | ICD-10-CM | POA: Insufficient documentation

## 2021-06-08 DIAGNOSIS — I110 Hypertensive heart disease with heart failure: Secondary | ICD-10-CM | POA: Diagnosis not present

## 2021-06-08 DIAGNOSIS — I2510 Atherosclerotic heart disease of native coronary artery without angina pectoris: Secondary | ICD-10-CM | POA: Insufficient documentation

## 2021-06-08 DIAGNOSIS — I5022 Chronic systolic (congestive) heart failure: Secondary | ICD-10-CM

## 2021-06-08 DIAGNOSIS — I420 Dilated cardiomyopathy: Secondary | ICD-10-CM | POA: Insufficient documentation

## 2021-06-08 DIAGNOSIS — M1610 Unilateral primary osteoarthritis, unspecified hip: Secondary | ICD-10-CM | POA: Diagnosis not present

## 2021-06-08 DIAGNOSIS — Z853 Personal history of malignant neoplasm of breast: Secondary | ICD-10-CM | POA: Diagnosis not present

## 2021-06-08 DIAGNOSIS — K5190 Ulcerative colitis, unspecified, without complications: Secondary | ICD-10-CM | POA: Diagnosis not present

## 2021-06-08 DIAGNOSIS — Z8249 Family history of ischemic heart disease and other diseases of the circulatory system: Secondary | ICD-10-CM | POA: Diagnosis not present

## 2021-06-08 LAB — ECHOCARDIOGRAM COMPLETE
AR max vel: 1.4 cm2
AV Area VTI: 1.42 cm2
AV Area mean vel: 1.24 cm2
AV Mean grad: 3 mmHg
AV Peak grad: 6.3 mmHg
Ao pk vel: 1.25 m/s
Area-P 1/2: 3.46 cm2
Calc EF: 41.8 %
S' Lateral: 4 cm
Single Plane A2C EF: 30.8 %
Single Plane A4C EF: 50.2 %

## 2021-06-08 LAB — BASIC METABOLIC PANEL
Anion gap: 9 (ref 5–15)
BUN: 5 mg/dL — ABNORMAL LOW (ref 6–20)
CO2: 21 mmol/L — ABNORMAL LOW (ref 22–32)
Calcium: 9.1 mg/dL (ref 8.9–10.3)
Chloride: 101 mmol/L (ref 98–111)
Creatinine, Ser: 0.57 mg/dL (ref 0.44–1.00)
GFR, Estimated: >60 mL/min (ref >60)
Glucose, Bld: 171 mg/dL — ABNORMAL HIGH (ref 70–99)
Potassium: 4.2 mmol/L (ref 3.5–5.1)
Sodium: 131 mmol/L — ABNORMAL LOW (ref 135–145)

## 2021-06-08 LAB — BRAIN NATRIURETIC PEPTIDE: B Natriuretic Peptide: 22.6 pg/mL (ref 0.0–100.0)

## 2021-06-08 NOTE — Progress Notes (Signed)
°  Echocardiogram 2D Echocardiogram has been performed.  Erin Fields 06/08/2021, 12:02 PM

## 2021-06-08 NOTE — Progress Notes (Signed)
PCP: Dr. Lorelei Pont Cardiology: Dr. Agustin Cree  56 y.o. with history of ulcerative colitis, HTN, and breast cancer was referred by Dr. Agustin Cree for evaluation of CHF.   Patient's breast cancer was treated with surgery and radiation, no chemo.  Patient reports exertional dyspnea since 2019.  Patient has right hip OA, needs THR.  Echo was done in 1/22 as part of pre-op workup, this showed low EF 25%.  She then had RHC/LHC in 3/22 with mild nonobstructive CAD and low cardiac index 2.1.  Echo in 4/22 showed EF still 20-25%.  She was referred for ICD.  St Jude ICD was placed in 0/94 but complicated by RV perforation with hemothorax and hemorrhagic shock.  ICD was removed and RV was surgically repaired with sternotomy.  Patient's mother had CHF in her 70J, uncertain etiology.   Cardiac MRI in 9/22 showed EF 35% with moderate LV hypokinesis, RV EF 44%, small area of LGE at the true apex (?small embolic apical infarct).    Echo was done today and reviewed, EF 40-45%, diffuse hypokinesis, mildly decreased RV systolic function.  Patient returns for followup of CHF.  Main complaint continues to be right hip pain.  She has significant osteoarthritis and wants to have THR done.  No dyspnea walking on flat ground or up a flight of stairs.  No chest pain.  No orthopnea/PND.  She has occasional wheezes. She has some upper respiratory congestion today.   Labs (7/22): K 4.2, creatinine 0.58 Labs (8/22): K 4.3, creatinine 0.52, BNP 30 Labs (10/22): K 4.2, creatinine 0.76 Labs (1/23): K 5.1, creatinine 0.71  ECG (personally reviewed): NSR, anterolateral TWIs  PMH: 1.  Cervical cancer 2.  B12 deficiency 3.  Asthma 4.  Type 2 diabetes 5.  Ulcerative colitis 6.  HTN 7.  Hypothyroidism 8.  Irritable bowel syndrome 9.  Breast cancer: Right-sided, surgery and radiation but no chemotherapy.  10.  Chronic systolic CHF: Nonischemic cardiomyopathy.  - Echo (1/22): EF 25% - LHC/RHC (3/22): Mild nonobstructive CAD; mean  RA 3, PA 24/11, CI 2.1.  - Echo (4/22): EF 20-25%, mild LV dilation, normal RV.  - Echo (6/22): EF 30-35%  - St Jude ICD placed 6/28, complicated by RV perforation requiring sternotomy due to hemothorax/hemorrhagic shock, removal of ICD and surgical repair of RV perforation.  - Cardiac MRI (9/22): EF 35% with moderate LV hypokinesis, RV EF 44%, small area of LGE at the true apex (?small embolic apical infarct).   - Echo (2/23): EF 40-45%, diffuse hypokinesis, mildly decreased RV systolic function. 11.  Right hip OA: Needs THR.  12.  COVID-19 12/21  FH: Grandfather with MI, mother with CHF in her 67s.    SH: Lives in [place], nonsmoker, rare EtoH.   ROS: All systems reviewed and negative except as per HPI.  Current Outpatient Medications  Medication Sig Dispense Refill   acyclovir (ZOVIRAX) 400 MG tablet TAKE 1 TABLET (400 MG TOTAL) BY MOUTH 3 (THREE) TIMES DAILY AS NEEDED. 270 tablet 1   Adalimumab 80 MG/0.8ML PNKT Inject 80 mg into the skin every 14 (fourteen) days.     albuterol (VENTOLIN HFA) 108 (90 Base) MCG/ACT inhaler Inhale 2 puffs into the lungs every 6 (six) hours as needed for shortness of breath.     ascorbic acid (VITAMIN C) 1000 MG tablet Take 1,000 mg by mouth daily.     aspirin EC 81 MG tablet Take 81 mg by mouth daily. Swallow whole.     Azelastine HCl 0.15 % SOLN  Place 2 sprays into both nostrils 2 (two) times daily. For 30 days     Calcium Carb-Cholecalciferol (CALCIUM+D3) 600-800 MG-UNIT TABS Take 1 tablet by mouth daily.     carvedilol (COREG) 12.5 MG tablet Take 1 tablet (12.5 mg total) by mouth 2 (two) times daily. 180 tablet 3   cetirizine (ZYRTEC) 10 MG tablet Take 10 mg by mouth daily.     dapagliflozin propanediol (FARXIGA) 10 MG TABS tablet Take 1 tablet (10 mg total) by mouth daily before breakfast. 30 tablet 11   diphenoxylate-atropine (LOMOTIL) 2.5-0.025 MG tablet Take 2 tablets by mouth 4 (four) times daily as needed for diarrhea or loose stools.      EPINEPHrine 0.3 mg/0.3 mL IJ SOAJ injection Inject 0.3 mg into the muscle as needed for anaphylaxis.     exemestane (AROMASIN) 25 MG tablet TAKE 1 TABLET (25 MG TOTAL) BY MOUTH DAILY AFTER BREAKFAST. 90 tablet 1   HYDROcodone-acetaminophen (NORCO/VICODIN) 5-325 MG tablet Take 1-2 tablets by mouth every 8 (eight) hours as needed for moderate pain or severe pain. 30 tablet 0   Hyoscyamine Sulfate 0.375 MG TBCR Take 0.375 mg by mouth in the morning and at bedtime.     lansoprazole (PREVACID SOLUTAB) 15 MG disintegrating tablet Take 15 mg by mouth 2 (two) times daily before a meal.     levothyroxine (SYNTHROID) 75 MCG tablet Take 1 tablet (75 mcg total) by mouth daily before breakfast. 90 tablet 2   MAGNESIUM PO Take 250 mg by mouth daily.     meloxicam (MOBIC) 15 MG tablet Take 15 mg by mouth daily as needed for pain (Hip pain).     Menthol, Topical Analgesic, (BIOFREEZE COLORLESS) 4 % GEL Apply 1 application topically daily as needed (Hip pain).     mesalamine (LIALDA) 1.2 g EC tablet Take 2.4 g by mouth in the morning and at bedtime.      metFORMIN (GLUCOPHAGE) 500 MG tablet Take 1 tablet (500 mg total) by mouth 2 (two) times daily with a meal. 180 tablet 3   montelukast (SINGULAIR) 10 MG tablet Take 10 mg by mouth at bedtime.      Omega 3 1000 MG CAPS Take 2,000 mg by mouth every other day.     promethazine (PHENERGAN) 25 MG tablet Take 0.5-1 tablets (12.5-25 mg total) by mouth daily as needed for nausea or vomiting. 30 tablet 5   rosuvastatin (CRESTOR) 20 MG tablet TAKE 1 TABLET BY MOUTH EVERY DAY (Patient taking differently: Take 20 mg by mouth daily.) 90 tablet 1   sacubitril-valsartan (ENTRESTO) 97-103 MG Take 1 tablet by mouth 2 (two) times daily. 60 tablet 4   spironolactone (ALDACTONE) 25 MG tablet Take 0.5 tablets (12.5 mg total) by mouth daily. (Patient taking differently: Take 25 mg by mouth daily.) 45 tablet 3   SYMBICORT 160-4.5 MCG/ACT inhaler INHALE 2 PUFFS INTO THE LUNGS TWICE A DAY  (Patient taking differently: Inhale 2 puffs into the lungs daily.) 30.6 each 1   triamcinolone (NASACORT) 55 MCG/ACT AERO nasal inhaler Place 2 sprays into the nose daily.     venlafaxine XR (EFFEXOR-XR) 150 MG 24 hr capsule TAKE 1 CAPSULE BY MOUTH DAILY WITH BREAKFAST. 90 capsule 1   zinc gluconate 50 MG tablet Take 50 mg by mouth daily.     zolpidem (AMBIEN) 5 MG tablet Take 1 tablet (5 mg total) by mouth at bedtime as needed for sleep. TAKE 1 TABLET BY MOUTH EVERY DAY AT BEDTIME AS NEEDED FOR SLEEP 30  tablet 3   No current facility-administered medications for this encounter.   General: NAD Neck: No JVD, no thyromegaly or thyroid nodule.  Lungs: Clear to auscultation bilaterally with normal respiratory effort. CV: Nondisplaced PMI.  Heart regular S1/S2, no S3/S4, no murmur.  No peripheral edema.  No carotid bruit.  Normal pedal pulses.  Abdomen: Soft, nontender, no hepatosplenomegaly, no distention.  Skin: Intact without lesions or rashes.  Neurologic: Alert and oriented x 3.  Psych: Normal affect. Extremities: No clubbing or cyanosis.  HEENT: Normal.   Assessment/Plan: 1. Chronic systolic CHF: Nonischemic cardiomyopathy.  EF noted to be low since 1/22.  LHC in 3/22 with mild nonobstructive coronary disease.  Last echo in 6/22 with EF 30-35%.  Cardic MRI in 9/22 showed EF 35% with moderate LV hypokinesis, RV EF 44%, small area of LGE at the true apex (?small embolic apical infarct).  Echo today showed EF up to 40-45%.  Cause uncertain, her mother had a cardiomyopathy of uncertain etiology so cannot rule out familial cardiomyopathy.  She had treatment for breast cancer but this did not include chemotherapy.  She has been on adalimumab for ulcerative colitis, but this does not commonly cause CHF.  No drugs and rare ETOH.  The small area of apical LGE does not explain the cardiomyopathy.  NYHA class II symptoms, more limited by hip pain.  Not volume overloaded.  - Continue Entresto 97/103 bid.  BMET/BNP today.  - Continue Coreg 12.5 mg bid, unable to tolerate higher dose.  - Continue dapagliflozin 10 mg daily.  - Continue spironolactone 25 mg daily.   - Patient has been unsure about placement again of ICD give complicated initial ICD placement.  Subcutaneous ICD would be an option. However, EF up to 40-45% on today's echo, so out of ICD range.   2. RV perforation with ICD placement: S/p sternotomy/repair in 6/22.  3. Hip OA: Patient will would likely be an acceptable candidate in the future for THR.  She has minimal dyspnea.  This should be done at [facility] with close availability of cardiology.  Plan currently for next summer.   Followup in 6 months but will need BMET in 3 months.   Erin Fields 06/08/2021

## 2021-06-08 NOTE — Patient Instructions (Addendum)
Labwork completed today. We will call with any abnormal values.  Please call our office in June/July to schedule a follow up appt for August  At the [facility], you and your health needs are our priority. We have a designated team specialized in the treatment of Heart Failure. This Care Team includes your primary Heart Failure Specialized Cardiologist (physician), Advanced Practice Providers (APPs- Physician Assistants and Nurse Practitioners), and Pharmacist who all work together to provide you with the care you need, when you need it.   You may see any of the following providers on your designated Care Team at your next follow up:  Dr Glori Bickers Dr Haynes Kerns, NP Lyda Jester, [place] [facility] [place], [place] Audry Riles, PharmD   Please be sure to bring in all your medications bottles to every appointment.   Need to Contact us:  If you have any questions or concerns before your next appointment please send [place] a message through [place] or call our office at [phone].    TO LEAVE A MESSAGE FOR THE NURSE SELECT OPTION 2, PLEASE LEAVE A MESSAGE INCLUDING: YOUR NAME DATE OF BIRTH CALL BACK NUMBER REASON FOR CALL**this is important as we prioritize the call backs  YOU WILL RECEIVE A CALL BACK THE SAME DAY AS LONG AS YOU CALL BEFORE 4:00 PM

## 2021-06-09 ENCOUNTER — Encounter: Payer: Self-pay | Admitting: Family Medicine

## 2021-06-09 MED ORDER — HYDROCODONE-ACETAMINOPHEN 10-325 MG PO TABS: 0.5000 | ORAL_TABLET | Freq: Three times a day (TID) | ORAL | 0 refills | Status: DC | PRN

## 2021-06-09 NOTE — Addendum Note (Signed)
Addended by: Darreld Mclean on: 06/09/2021 05:21 PM   Modules accepted: Orders

## 2021-06-10 ENCOUNTER — Other Ambulatory Visit: Payer: Self-pay | Admitting: Family Medicine

## 2021-06-13 ENCOUNTER — Encounter: Payer: Self-pay | Admitting: Family Medicine

## 2021-06-21 LAB — HM DIABETES EYE EXAM

## 2021-06-28 NOTE — Progress Notes (Signed)
Therapist, music at [facility] ?[address], [address] ?[place],  [address] ?336 [phone] ?Fax [phone] ? ?Date:  06/29/2021  ? ?Name:  Erin Fields   DOB:  [birth year]   MRN:  [id number] ? ?PCP:  Darreld Mclean, MD  ? ? ?Chief Complaint: Cough (Productive cough, going on for months/Had abscessed tooth in December) and Nasal Drainage ? ? ?History of Present Illness: ? ?Erin Fields is a 56 y.o. very pleasant female patient who presents with the following: ? ?Pt seen today with concern of cough- she has noted it for several months ?She does have history of allergies- sees Dr Donneta Romberg ? ?Pt notes she has a cough that feels like it is more in her throat than in her chest ?She will feel like mucus is in the back of her throat ?She recently was treated for a tooth abscess- had a 2nd root canal last month  ?Her tooth issue is finally resolved ?No fever or chills ?She feels ok otherwise except for runny nose, occasional sneezing ?She is taking Astelin nasal spray, Nasacort nasal spray, Singulair, and Zyrtec already ?The cough seems to come and go  ? ?Thankfully, her EF has actually recovered enough to where she no longer needs an ICD.  They plan to get her hip replacement done this summer finally ? ?Last visit with myself was in November - history significant for right-sided breast cancer, type 2 diabetes, nonobstructive coronary artery disease, hypertension, hyperlipidemia, end-stage hip arthritis  ?Seen by CHF clinic 2/22 ? Patient reports exertional dyspnea since 2019.  Patient has right hip OA, needs THR.  Echo was done in 1/22 as part of pre-op workup, this showed low EF 25%.  She then had RHC/LHC in 3/22 with mild nonobstructive CAD and low cardiac index 2.1.  Echo in 4/22 showed EF still 20-25%.  She was referred for ICD.  St Jude ICD was placed in 8/18 but complicated by RV perforation with hemothorax and hemorrhagic shock.  ICD was removed and RV was surgically repaired with  sternotomy.----------------------- ?Assessment/Plan: ?1. Chronic systolic CHF: Nonischemic cardiomyopathy.  EF noted to be low since 1/22.  LHC in 3/22 with mild nonobstructive coronary disease.  Last echo in 6/22 with EF 30-35%.  Cardic MRI in 9/22 showed EF 35% with moderate LV hypokinesis, RV EF 44%, small area of LGE at the true apex (?small embolic apical infarct).  Echo today showed EF up to 40-45%.  Cause uncertain, her mother had a cardiomyopathy of uncertain etiology so cannot rule out familial cardiomyopathy.  She had treatment for breast cancer but this did not include chemotherapy.  She has been on adalimumab for ulcerative colitis, but this does not commonly cause CHF.  No drugs and rare ETOH.  The small area of apical LGE does not explain the cardiomyopathy.  NYHA class II symptoms, more limited by hip pain.  Not volume overloaded.  ?- Continue Entresto 97/103 bid. BMET/BNP today.  ?- Continue Coreg 12.5 mg bid, unable to tolerate higher dose.  ?- Continue dapagliflozin 10 mg daily.  ?- Continue spironolactone 25 mg daily.   ?- Patient has been unsure about placement again of ICD give complicated initial ICD placement.  Subcutaneous ICD would be an option. However, EF up to 40-45% on today's echo, so out of ICD range.   ?2. RV perforation with ICD placement: S/p sternotomy/repair in 6/22.  ?3. Hip OA: Patient will would likely be an acceptable candidate in the future  for THR.  She has minimal dyspnea.  This should be done at [facility] with close availability of cardiology.  Plan currently for next summer.  ? ?Patient Active Problem List  ? Diagnosis Date Noted  ? Breast cancer ([place]) 05/11/2021  ? Hemothorax on left 10/02/2020  ? S/P evacuation of hematoma 10/02/2020  ? 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 Hypovolemic shock ([place]) 10/02/2020  ? Anxiety   ? Coronary artery disease 25% of RCA, 20% intermediate branch, 40% diagonal branch based on cardiac cath from 2022  07/22/2020  ? Dilated cardiomyopathy ([place]) ejection fraction 25% by echocardiogram from January 2022 04/29/2020  ? Congestive heart failure ([place]) 04/29/2020  ? Allergic rhinitis due to animal (cat) (dog) hair and dander 04/27/2020  ? Allergic rhinitis due to pollen 04/27/2020  ? Mild persistent asthma, uncomplicated 46/56/8127  ? PONV (postoperative nausea and vomiting)   ? Migraines   ? GERD (gastroesophageal reflux disease)   ? Environmental allergies   ? Diabetes mellitus without complication ([place])   ? Depression   ? Colon polyps   ? B12 deficiency 11/27/2019  ? IDA (iron deficiency anemia) 11/06/2019  ? Asthma, persistent controlled 10/05/2018  ? Recurrent infections 10/05/2018  ? Anxiety and depression 01/20/2018  ? Right upper lobe pulmonary nodule 01/10/2018  ? Nasal polyp, unspecified 01/10/2018  ? Left sided abdominal pain 12/13/2017  ? Impaired fasting glucose 12/12/2017  ? History of adenomatous polyp of colon 11/19/2017  ? Genetic testing 07/16/2017  ? Family history of breast cancer   ? Controlled type 2 diabetes mellitus without complication, without long-term current use of insulin ([place]) 05/28/2017  ? Personal history of radiation therapy 2019  ? Cancer [facility]) 2019  ? Malignant neoplasm of upper-outer quadrant of right breast in female, estrogen receptor positive ([place]) 03/20/2017  ? Crohn disease ([place]) 11/29/2016  ? Borderline diabetes 12/17/2015  ? Ex-smoker 11/18/2015  ? Chronic venous insufficiency 09/02/2015  ? Symptomatic varicose veins, right 09/02/2015  ? Chronic sinusitis 01/26/2015  ? Eustachian tube dysfunction 12/04/2014  ? Other allergic rhinitis 09/15/2014  ? Dyspnea 09/15/2014  ? Liver hemangioma 08/24/2014  ? Elevated triglycerides with high cholesterol 11/26/2013  ? Insomnia 08/20/2013  ? Hypertension 08/20/2013  ? Ulcerative colitis ([place]) 04/20/2013  ? Oral herpes 04/20/2013  ? Hyperlipidemia 12/06/2006  ? HEADACHE 12/06/2006  ? CERVICAL CANCER, HX OF 12/06/2006  ? Hypothyroidism  11/29/2006  ? Asthma 11/29/2006  ? IRRITABLE BOWEL SYNDROME 11/29/2006  ? ? ?Past Medical History:  ?Diagnosis Date  ? Allergic rhinitis due to animal (cat) (dog) hair and dander 04/27/2020  ? Allergic rhinitis due to pollen 04/27/2020  ? Anxiety   ? Anxiety and depression 01/20/2018  ? Asthma   ? Asthma, persistent controlled 10/05/2018  ? B12 deficiency 11/27/2019  ? Borderline diabetes 12/17/2015  ? Breast cancer ([place])   ? Cancer [facility]) 2019  ? right breast cancer  ? CERVICAL CANCER, HX OF 12/06/2006  ? Qualifier: Diagnosis of  By: Arnoldo Morale MD, Balinda Quails   ? Chronic sinusitis 01/26/2015  ? Chronic venous insufficiency 09/02/2015  ? Colon polyps   ? Congestive heart failure ([place]) 04/29/2020  ? Controlled type 2 diabetes mellitus without complication, without long-term current use of insulin ([place]) 05/28/2017  ? Coronary artery disease 25% of RCA, 20% intermediate branch, 40% diagonal branch based on cardiac cath from 2022 07/22/2020  ? Crohn disease ([place]) 11/29/2016  ? Depression   ? Diabetes mellitus without complication ([place])   ?  type 2  ? Dilated cardiomyopathy ([place]) ejection fraction 25% by echocardiogram from January 2022 04/29/2020  ? Dyspnea 09/15/2014  ? Elevated triglycerides with high cholesterol 11/26/2013  ? Environmental allergies   ? Eustachian tube dysfunction 12/04/2014  ? Ex-smoker 11/18/2015  ? Family history of breast cancer   ? Genetic testing 07/16/2017  ? Negative genetic testing on the multi-cancer panel.  The Multi-Gene Panel offered by [name] includes sequencing and/or deletion duplication testing of the following 83 genes: ALK, APC, ATM, AXIN2,BAP1,  BARD1, BLM, BMPR1A, BRCA1, BRCA2, BRIP1, CASR, CDC73, CDH1, CDK4, CDKN1B, CDKN1C, CDKN2A (p14ARF), CDKN2A (p16INK4a), CEBPA, CHEK2, CTNNA1, DICER1, DIS3L2, EGFR (c.2369C>T, p.Thr790Met variant onl  ? GERD (gastroesophageal reflux disease)   ? HEADACHE 12/06/2006  ? Qualifier: Diagnosis of  By: Arnoldo Morale MD, Balinda Quails   ? Hemothorax on left 10/02/2020   ? History of adenomatous polyp of colon 11/19/2017  ? Hyperlipidemia   ? Hypertension   ? Hypothyroidism   ? IDA (iron deficiency anemia) 11/06/2019  ? Impaired fasting glucose 12/12/2017  ? Insomnia 08/20/2013  ? Irr

## 2021-06-29 ENCOUNTER — Ambulatory Visit: Payer: No Typology Code available for payment source | Admitting: Family Medicine

## 2021-06-29 ENCOUNTER — Encounter: Payer: Self-pay | Admitting: Family Medicine

## 2021-06-29 VITALS — BP 110/70 | HR 79 | Resp 20 | Ht 63.0 in | Wt 162.8 lb

## 2021-06-29 DIAGNOSIS — R0982 Postnasal drip: Secondary | ICD-10-CM | POA: Diagnosis not present

## 2021-06-29 MED ORDER — IPRATROPIUM BROMIDE 0.06 % NA SOLN: 2.0000 | Freq: Four times a day (QID) | NASAL | 12 refills | Status: DC

## 2021-06-29 NOTE — Patient Instructions (Signed)
It was good to see you again today!  Try the atrovent nasal spray for post nasal drainage leading to cough.  I hope this will help you- please keep me posted  ?

## 2021-07-11 ENCOUNTER — Encounter: Payer: Self-pay | Admitting: Family Medicine

## 2021-07-12 MED ORDER — HYDROCODONE-ACETAMINOPHEN 10-325 MG PO TABS: 0.5000 | ORAL_TABLET | Freq: Three times a day (TID) | ORAL | 0 refills | Status: DC | PRN

## 2021-07-19 NOTE — Progress Notes (Signed)
?[place]   ?[phone] [phone]   ?Clinic Follow up Note  ? ?Patient Care Team: ?Cop[place], Gay Filler, MD as PCP - General (Family Medicine) ?Park Liter, MD as PCP - Cardiology (Cardiology) ?[name], MD as Referring Physician (Obstetrics and Gynecology) ?[name], MD as Consulting Physician (Gastroenterology) ?[name], MD as Referring Physician (Allergy) ?[name], MD as Consulting Physician (General Surgery) ?[name], MD as Consulting Physician (Radiation Oncology) ?[name], MD as Consulting Physician (Hematology) ?Gardenia Phlegm, NP as Nurse Practitioner (Hematology and Oncology) ?[name], MD as Consulting Physician (Gynecologic Oncology) ?Park Liter, MD as Consulting Physician (Cardiology) ?07/20/2021 ? ?CHIEF COMPLAINT: Follow-up right breast cancer ? ?SUMMARY OF ONCOLOGIC HISTORY: ?Oncology History Overview Note  ?Cancer Staging ?Malignant neoplasm of upper-outer quadrant of right breast in female, estrogen receptor positive ([place]) ?Staging form: Breast, AJCC 8th Edition ?- Clinical stage from 03/16/2017: Stage IA (cT1b, cN0, cM0, G2, ER: Positive, PR: Positive, HER2: Negative) - Signed by [name], MD on 03/28/2017 ?- Pathologic stage from 05/15/2017: Stage IA (pT2, pN0, cM0, G2, ER+, PR+, HER2-, Oncotype DX score: 3) - Signed by [name], MD on 08/06/2017 ? ? ?  ?Malignant neoplasm of upper-outer quadrant of right breast in female, estrogen receptor positive ([place])  ?03/15/2017 Mammogram  ? Diagnostic Mammogram Right Breast  ?IMPRESSION: ?Palpable abnormality corresponds to distortion and focal mass ?associated acoustic shadowing measuring 0.8x0.9x0.9cm in the 930 o'clock location of the ?right breast 7 cm from the nipple.  ?  ?RECOMMENDATION: ?Ultrasound-guided core biopsy is recommended and scheduled for the ?patient. ?  ?03/16/2017 Initial Biopsy  ? Biopsy Results  ?Diagnosis 03/16/17 ?Breast, right,  needle core biopsy, 9:30 o'clock, ribbon clip placed ?- INVASIVE MAMMARY CARCINOMA. ?- SEE COMMENT. ? ?  ?03/16/2017 Receptors her2  ? Estrogen Receptor: 95%, POSITIVE, STRONG STAINING INTENSITY ?Progesterone Receptor: 95%, POSITIVE, STRONG STAINING INTENSITY ?Proliferation Marker Ki67: 3% ?HER2: NEGATIVE ?  ?03/20/2017 Initial Diagnosis  ? Malignant neoplasm of upper-outer quadrant of right breast in female, estrogen receptor positive ([place]) ?  ?04/24/2017 Surgery  ? Right Breast Lumpectomy with Sentinel Node Biopsy with Dr. [name]  ?  ?04/24/2017 Pathology Results  ? Diagnosis 04/24/17 ?1. Breast, lumpectomy, Right w/seed ?- INVASIVE LOBULAR CARCINOMA, GRADE 2, SPANNING 2.2 CM. ?- LOBULAR NEOPLASIA (ATYPICAL LOBULAR HYPERPLASIA). ?- ANTERIOR SUPERIOR MARGIN IS BROADLY POSITIVE. ?- BIOPSY SITE. ?- SEE ONCOLOGY TABLE. ?2. Breast, excision, Right additional Medial Margin ?- INVASIVE LOBULAR CARCINOMA. ?- LOBULAR CARCINOMA IN SITU. ?- INVASIVE CARCINOMA IS 0.3 CM FROM NEW MEDIAL MARGIN. ?3. Lymph node, sentinel, biopsy, Right Axillary #1 ?- ISOLATED TUMOR CELLS IN ONE OF ONE LYMPH NODES (0/1). ?4. Lymph node, sentinel, biopsy, Right Axillary #2 ?- ONE OF ONE LYMPH NODES NEGATIVE FOR CARCINOMA (0/1). ?5. Lymph node, sentinel, biopsy, Right Axillary #3 ?- ONE OF ONE LYMPH NODES NEGATIVE FOR CARCINOMA (0/1). ?  ?04/24/2017 Oncotype testing  ? Her Oncotyple recurrence score was 3, her distant recurrence score with Tamoxifen was 3% and her adjuvant chemotherapy benefit was <1%.  ?  ?05/15/2017 Surgery  ? Re-excision of Right Breast Lumpectomy with Dr. [name]  ?  ?05/15/2017 Pathology Results  ? Diagnosis 05/15/17 ?Breast, excision, Right additional anterior margin ?- INVASIVE LOBULAR CARCINOMA. ?- ATYPICAL LOBULAR HYPERPLASIA. ?- THE NEW ANTERIOR SURGICAL RESECTION MARGIN IS NEGATIVE FOR CARCINOMA. ?  ?05/15/2017 Cancer Staging  ? Staging form: Breast, AJCC 8th Edition ?- Pathologic stage from 05/15/2017: Stage IA (pT2, pN0, cM0, G2,  ER+, PR+, HER2-,  Oncotype DX score: 3) - Signed by [name], MD on 08/06/2017 ? ?  ?06/26/2017 -  Radiation Therapy  ? Adjuvant RT with Dr. [name] ?  ?07/12/2017 Genetic Testing  ? Negative genetic testing on the multi-cancer panel.  The Multi-Gene Panel offered by [name] includes sequencing and/or deletion duplication testing of the following 83 genes: ALK, APC, ATM, AXIN2,BAP1,  BARD1, BLM, BMPR1A, BRCA1, BRCA2, BRIP1, CASR, CDC73, CDH1, CDK4, CDKN1B, CDKN1C, CDKN2A (p14ARF), CDKN2A (p16INK4a), CEBPA, CHEK2, CTNNA1, DICER1, DIS3L2, EGFR (c.2369C>T, p.Thr790Met variant only), EPCAM (Deletion/duplication testing only), FH, FLCN, GATA2, GPC3, GREM1 (Promoter region deletion/duplication testing only), HOXB13 (c.251G>A, p.Gly84Glu), HRAS, KIT, MAX, MEN1, MET, MITF (c.952G>A, p.Glu318Lys variant only), MLH1, MSH2, MSH3, MSH6, MUTYH, NBN, NF1, NF2, NTHL1, PALB2, PDGFRA, PHOX2B, PMS2, POLD1, POLE, POT1, PRKAR1A, PTCH1, PTEN, RAD50, RAD51C, RAD51D, RB1, RECQL4, RET, RUNX1, SDHAF2, SDHA (sequence changes only), SDHB, SDHC, SDHD, SMAD4, SMARCA4, SMARCB1, SMARCE1, STK11, SUFU, TERT, TERT, TMEM127, TP53, TSC1, TSC2, VHL, WRN and WT1.  The report date is July 12, 2017. ? ?  ?08/13/2017 Imaging  ? 08/13/2017 DEXA ?ASSESSMENT: ?The BMD measured at AP Spine L1-L4 is 1.120 g/cm2 with a T-score of ?-0.5. This patient is considered normal according to [place] ?Organization (WHO) criteria. ? ?  ?08/2017 -  Anti-estrogen oral therapy  ? Letrozole daily, changed to Exemestane due to poor tolerance of Letrozole ?  ?01/16/2018 Imaging  ? 01/16/2018 CT Chest ?IMPRESSION: ?Stable 2 mm RIGHT upper lobe nodule. ?  ?Post radiation therapy changes at the anterolateral RIGHT upper ?lobe. ?  ?Post RIGHT breast surgery and RIGHT axillary node dissection. ?  ?No new abnormalities. ?  ?Aortic Atherosclerosis (ICD10-I70.0). ?  ?08/09/2018 Mammogram  ? IMPRESSION: ?1. No mammographic evidence of malignancy involving either breast. ?2. Expected post  lumpectomy and post radiation changes involving the ?RIGHT breast. ?  ? ? ?CURRENT THERAPY: Exemestane started 09/2017 ? ?INTERVAL HISTORY: Ms. Dettmann returns for follow-up as scheduled, last seen by Dr. Burr Medico 01/21/2021. She continues exemestane.  She has slowly recovered from heart surgery last year.  Due to activity restrictions and immobility she had fluid reaccumulation and edema in the right breast.  She feels "fluid pockets" and occasional soreness.  Denies new lump/mass, nipple discharge, or skin change.  She did PT in the past.  She hopes to get a right hip replacement this year.  She continues exemestane, tolerating mild intermittent hot flashes.  Denies new bone or joint pain.  Mood is stable. Mammogram and DEXA due 12/2021.  ? ?All other systems were reviewed with the patient and are negative. ? ?MEDICAL HISTORY:  ?Past Medical History:  ?Diagnosis Date  ? Allergic rhinitis due to animal (cat) (dog) hair and dander 04/27/2020  ? Allergic rhinitis due to pollen 04/27/2020  ? Anxiety   ? Anxiety and depression 01/20/2018  ? Asthma   ? Asthma, persistent controlled 10/05/2018  ? B12 deficiency 11/27/2019  ? Borderline diabetes 12/17/2015  ? Breast cancer ([place])   ? Cancer [facility]) 2019  ? right breast cancer  ? CERVICAL CANCER, HX OF 12/06/2006  ? Qualifier: Diagnosis of  By: Arnoldo Morale MD, Balinda Quails   ? Chronic sinusitis 01/26/2015  ? Chronic venous insufficiency 09/02/2015  ? Colon polyps   ? Congestive heart failure ([place]) 04/29/2020  ? Controlled type 2 diabetes mellitus without complication, without long-term current use of insulin ([place]) 05/28/2017  ? Coronary artery disease 25% of RCA, 20% intermediate branch, 40% diagonal branch based on cardiac cath from 2022 07/22/2020  ?  Crohn disease ([place]) 11/29/2016  ? Depression   ? Diabetes mellitus without complication ([place])   ? type 2  ? Dilated cardiomyopathy ([place]) ejection fraction 25% by echocardiogram from January 2022 04/29/2020  ? Dyspnea 09/15/2014  ? Elevated  triglycerides with high cholesterol 11/26/2013  ? Environmental allergies   ? Eustachian tube dysfunction 12/04/2014  ? Ex-smoker 11/18/2015  ? Family history of breast cancer   ? Genetic testing 07/16/2017  ? Ne

## 2021-07-20 ENCOUNTER — Encounter: Payer: Self-pay | Admitting: Nurse Practitioner

## 2021-07-20 ENCOUNTER — Inpatient Hospital Stay (HOSPITAL_BASED_OUTPATIENT_CLINIC_OR_DEPARTMENT_OTHER): Payer: No Typology Code available for payment source | Admitting: Nurse Practitioner

## 2021-07-20 ENCOUNTER — Other Ambulatory Visit: Payer: Self-pay

## 2021-07-20 ENCOUNTER — Inpatient Hospital Stay: Payer: No Typology Code available for payment source | Attending: Nurse Practitioner

## 2021-07-20 VITALS — BP 93/62 | HR 93 | Temp 97.4°F | Resp 17 | Wt 163.5 lb

## 2021-07-20 DIAGNOSIS — F419 Anxiety disorder, unspecified: Secondary | ICD-10-CM | POA: Diagnosis not present

## 2021-07-20 DIAGNOSIS — J45909 Unspecified asthma, uncomplicated: Secondary | ICD-10-CM | POA: Insufficient documentation

## 2021-07-20 DIAGNOSIS — C50411 Malignant neoplasm of upper-outer quadrant of right female breast: Secondary | ICD-10-CM

## 2021-07-20 DIAGNOSIS — Z79899 Other long term (current) drug therapy: Secondary | ICD-10-CM | POA: Diagnosis not present

## 2021-07-20 DIAGNOSIS — Z170 Estrogen receptor positive status [ER+]: Secondary | ICD-10-CM | POA: Diagnosis not present

## 2021-07-20 DIAGNOSIS — E039 Hypothyroidism, unspecified: Secondary | ICD-10-CM | POA: Diagnosis not present

## 2021-07-20 DIAGNOSIS — E119 Type 2 diabetes mellitus without complications: Secondary | ICD-10-CM | POA: Insufficient documentation

## 2021-07-20 DIAGNOSIS — Z79811 Long term (current) use of aromatase inhibitors: Secondary | ICD-10-CM | POA: Diagnosis not present

## 2021-07-20 DIAGNOSIS — I700 Atherosclerosis of aorta: Secondary | ICD-10-CM | POA: Diagnosis not present

## 2021-07-20 DIAGNOSIS — K5190 Ulcerative colitis, unspecified, without complications: Secondary | ICD-10-CM | POA: Insufficient documentation

## 2021-07-20 LAB — CMP (CANCER CENTER ONLY)
ALT: 13 U/L (ref 0–44)
AST: 11 U/L — ABNORMAL LOW (ref 15–41)
Albumin: 4.5 g/dL (ref 3.5–5.0)
Alkaline Phosphatase: 75 U/L (ref 38–126)
Anion gap: 9 (ref 5–15)
BUN: 10 mg/dL (ref 6–20)
CO2: 23 mmol/L (ref 22–32)
Calcium: 9.4 mg/dL (ref 8.9–10.3)
Chloride: 100 mmol/L (ref 98–111)
Creatinine: 0.82 mg/dL (ref 0.44–1.00)
GFR, Estimated: >60 mL/min (ref >60)
Glucose, Bld: 191 mg/dL — ABNORMAL HIGH (ref 70–99)
Potassium: 4.6 mmol/L (ref 3.5–5.1)
Sodium: 132 mmol/L — ABNORMAL LOW (ref 135–145)
Total Bilirubin: 0.8 mg/dL (ref 0.3–1.2)
Total Protein: 6.7 g/dL (ref 6.5–8.1)

## 2021-07-20 LAB — CBC WITH DIFFERENTIAL (CANCER CENTER ONLY)
Abs Immature Granulocytes: 0.04 10*3/uL (ref 0.00–0.07)
Basophils Absolute: 0.1 10*3/uL (ref 0.0–0.1)
Basophils Relative: 1 %
Eosinophils Absolute: 0.5 10*3/uL (ref 0.0–0.5)
Eosinophils Relative: 6 %
HCT: 40.8 % (ref 36.0–46.0)
Hemoglobin: 13.4 g/dL (ref 12.0–15.0)
Immature Granulocytes: 0 %
Lymphocytes Relative: 38 %
Lymphs Abs: 3.7 10*3/uL (ref 0.7–4.0)
MCH: 29.5 pg (ref 26.0–34.0)
MCHC: 32.8 g/dL (ref 30.0–36.0)
MCV: 89.9 fL (ref 80.0–100.0)
Monocytes Absolute: 0.8 10*3/uL (ref 0.1–1.0)
Monocytes Relative: 8 %
Neutro Abs: 4.6 10*3/uL (ref 1.7–7.7)
Neutrophils Relative %: 47 %
Platelet Count: 411 10*3/uL — ABNORMAL HIGH (ref 150–400)
RBC: 4.54 MIL/uL (ref 3.87–5.11)
RDW: 12.2 % (ref 11.5–15.5)
WBC Count: 9.7 10*3/uL (ref 4.0–10.5)
nRBC: 0 % (ref 0.0–0.2)

## 2021-07-20 MED ORDER — EXEMESTANE 25 MG PO TABS: ORAL_TABLET | ORAL | 3 refills | Status: DC

## 2021-08-01 ENCOUNTER — Encounter: Payer: Self-pay | Admitting: Physical Therapy

## 2021-08-01 ENCOUNTER — Ambulatory Visit: Payer: No Typology Code available for payment source | Attending: Nurse Practitioner | Admitting: Physical Therapy

## 2021-08-01 DIAGNOSIS — C50411 Malignant neoplasm of upper-outer quadrant of right female breast: Secondary | ICD-10-CM | POA: Diagnosis present

## 2021-08-01 DIAGNOSIS — M6281 Muscle weakness (generalized): Secondary | ICD-10-CM | POA: Diagnosis not present

## 2021-08-01 DIAGNOSIS — I890 Lymphedema, not elsewhere classified: Secondary | ICD-10-CM | POA: Diagnosis not present

## 2021-08-01 DIAGNOSIS — Z170 Estrogen receptor positive status [ER+]: Secondary | ICD-10-CM | POA: Diagnosis not present

## 2021-08-01 NOTE — Therapy (Signed)
?OUTPATIENT PHYSICAL THERAPY ONCOLOGY EVALUATION ? ?Patient Name: Erin Fields ?MRN: [id number] ?DOB:[birth year], 56 y.o., female ?Today's Date: 08/01/2021 ? ? PT End of Session - 08/01/21 1546   ? ? Visit Number 1   ? Number of Visits 9   ? Date for PT Re-Evaluation 08/29/21   ? PT Start Time 1502   ? PT Stop Time 5809   ? PT Time Calculation (min) 43 min   ? Activity Tolerance Patient tolerated treatment well   ? Behavior During Therapy [facility] for tasks assessed/performed   ? ?  ?  ? ?  ? ? ?Past Medical History:  ?Diagnosis Date  ? Allergic rhinitis due to animal (cat) (dog) hair and dander 04/27/2020  ? Allergic rhinitis due to pollen 04/27/2020  ? Anxiety   ? Anxiety and depression 01/20/2018  ? Asthma   ? Asthma, persistent controlled 10/05/2018  ? B12 deficiency 11/27/2019  ? Borderline diabetes 12/17/2015  ? Breast cancer ([place])   ? Cancer [facility]) 2019  ? right breast cancer  ? CERVICAL CANCER, HX OF 12/06/2006  ? Qualifier: Diagnosis of  By: Arnoldo Morale MD, Balinda Quails   ? Chronic sinusitis 01/26/2015  ? Chronic venous insufficiency 09/02/2015  ? Colon polyps   ? Congestive heart failure ([place]) 04/29/2020  ? Controlled type 2 diabetes mellitus without complication, without long-term current use of insulin ([place]) 05/28/2017  ? Coronary artery disease 25% of RCA, 20% intermediate branch, 40% diagonal branch based on cardiac cath from 2022 07/22/2020  ? Crohn disease ([place]) 11/29/2016  ? Depression   ? Diabetes mellitus without complication ([place])   ? type 2  ? Dilated cardiomyopathy ([place]) ejection fraction 25% by echocardiogram from January 2022 04/29/2020  ? Dyspnea 09/15/2014  ? Elevated triglycerides with high cholesterol 11/26/2013  ? Environmental allergies   ? Eustachian tube dysfunction 12/04/2014  ? Ex-smoker 11/18/2015  ? Family history of breast cancer   ? Genetic testing 07/16/2017  ? Negative genetic testing on the multi-cancer panel.  The Multi-Gene Panel offered by [name] includes sequencing and/or deletion  duplication testing of the following 83 genes: ALK, APC, ATM, AXIN2,BAP1,  BARD1, BLM, BMPR1A, BRCA1, BRCA2, BRIP1, CASR, CDC73, CDH1, CDK4, CDKN1B, CDKN1C, CDKN2A (p14ARF), CDKN2A (p16INK4a), CEBPA, CHEK2, CTNNA1, DICER1, DIS3L2, EGFR (c.2369C>T, p.Thr790Met variant onl  ? GERD (gastroesophageal reflux disease)   ? HEADACHE 12/06/2006  ? Qualifier: Diagnosis of  By: Arnoldo Morale MD, Balinda Quails   ? Hemothorax on left 10/02/2020  ? History of adenomatous polyp of colon 11/19/2017  ? Hyperlipidemia   ? Hypertension   ? Hypothyroidism   ? IDA (iron deficiency anemia) 11/06/2019  ? Impaired fasting glucose 12/12/2017  ? Insomnia 08/20/2013  ? Irritable bowel syndrome 11/29/2006  ? Qualifier: Diagnosis of  By: Arnoldo Morale MD, Balinda Quails   ? Left sided abdominal pain 12/13/2017  ? Liver hemangioma 08/24/2014  ? Malignant neoplasm of upper-outer quadrant of right breast in female, estrogen receptor positive ([place]) 03/20/2017  ? Migraines   ? Mild persistent asthma, uncomplicated 98/33/8250  ? Nasal polyp, unspecified 01/10/2018  ? Oral herpes 04/20/2013  ? Other allergic rhinitis 09/15/2014  ? 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 Personal history of radiation therapy 2019  ? 36 radiation tx  ? PONV (postoperative nausea and vomiting)   ? scopolamine patch helps  ? Recurrent infections 10/05/2018  ? Right upper lobe pulmonary nodule 01/10/2018  ? Symptomatic varicose veins, right 09/02/2015  ? Ulcerative colitis ([place])   ? Varicose  veins   ? ?Past Surgical History:  ?Procedure Laterality Date  ? BREAST BIOPSY Right 02/2017  ? BREAST BIOPSY Right 11/07/2019  ? BREAST LUMPECTOMY Right 04/24/2017  ? BREAST LUMPECTOMY WITH RADIOACTIVE SEED AND SENTINEL LYMPH NODE BIOPSY Right 04/24/2017  ? Procedure: RIGHT BREAST LUMPECTOMY WITH RADIOACTIVE SEED AND SENTINEL LYMPH NODE BIOPSY;  Surgeon: [name], MD;  Location: [place];  Service: General;  Laterality: Right;  ? CORONARY ARTERY BYPASS GRAFT    ?  crapal tunnel relase Left 05/2018  ? ENDOMETRIAL ABLATION  2010  ? Had abnormal uterine bleeding (heavy and frequent), performed in [place], menses stopped completely 2 years after  ? EXPLORATORY LAPAROTOMY    ? Had exploratory surgery at the age of 5 for abdominal pain with cyst found on her left ovary, not treated surgically  ? FOOT SURGERY Right 2012  ? GENERATOR REMOVAL  10/02/2020  ? Procedure: GENERATOR REMOVAL;  Surgeon: Rexene Alberts, MD;  Location: [place];  Service: Thoracic;;  ? HAND SURGERY Right   ? ICD IMPLANT N/A 10/01/2020  ? Procedure: ICD IMPLANT;  Surgeon: Constance Haw, MD;  Location: [place] [facility];  Service: Cardiovascular;  Laterality: N/A;  ? ICD LEAD REMOVAL N/A 10/02/2020  ? Procedure: ICD LEAD REMOVAL;  Surgeon: Rexene Alberts, MD;  Location: [place];  Service: Thoracic;  Laterality: N/A;  ? MEDIASTERNOTOMY N/A 10/02/2020  ? Procedure: MEDIAN STERNOTOMY REPAIR PERFORATED VENTRICLE EVACUATION LEFT HEMOTHORAX;  Surgeon: Rexene Alberts, MD;  Location: [place];  Service: Thoracic;  Laterality: N/A;  ? RE-EXCISION OF BREAST LUMPECTOMY Right 05/15/2017  ? Procedure: RE-EXCISION OF BREAST LUMPECTOMY;  Surgeon: [name], MD;  Location: [place];  Service: General;  Laterality: Right;  ? RIGHT/LEFT HEART CATH AND CORONARY ANGIOGRAPHY N/A 06/21/2020  ? Procedure: RIGHT/LEFT HEART CATH AND CORONARY ANGIOGRAPHY;  Surgeon: Nelva Bush, MD;  Location: [place] [facility];  Service: Cardiovascular;  Laterality: N/A;  ? ROBOTIC ASSISTED SALPINGO OOPHERECTOMY Bilateral 08/29/2019  ? Procedure: XI ROBOTIC ASSISTED SALPINGO OOPHORECTOMY;  Surgeon: [name], MD;  Location[facility];  Service: Gynecology;  Laterality: Bilateral;  ? Septoplasty with turbinate reduction    ? TEE WITHOUT CARDIOVERSION N/A 10/02/2020  ? Procedure: TRANSESOPHAGEAL ECHOCARDIOGRAM (TEE);  Surgeon: Rexene Alberts, MD;  Location: [place];  Service: Thoracic;   Laterality: N/A;  ? TONSILLECTOMY  1980  ? VEIN SURGERY    ? Removal Right Leg  ? WISDOM TOOTH EXTRACTION    ? ?Patient Active Problem List  ? Diagnosis Date Noted  ? Breast cancer ([place]) 05/11/2021  ? Hemothorax on left 10/02/2020  ? S/P evacuation of hematoma 10/02/2020  ? 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 Hypovolemic shock ([place]) 10/02/2020  ? Anxiety   ? Coronary artery disease 25% of RCA, 20% intermediate branch, 40% diagonal branch based on cardiac cath from 2022 07/22/2020  ? Dilated cardiomyopathy ([place]) ejection fraction 25% by echocardiogram from January 2022 04/29/2020  ? Congestive heart failure ([place]) 04/29/2020  ? Allergic rhinitis due to animal (cat) (dog) hair and dander 04/27/2020  ? Allergic rhinitis due to pollen 04/27/2020  ? Mild persistent asthma, uncomplicated 58/85/0277  ? PONV (postoperative nausea and vomiting)   ? Migraines   ? GERD (gastroesophageal reflux disease)   ? Environmental allergies   ? Diabetes mellitus without complication ([place])   ? Depression   ? Colon polyps   ? B12 deficiency 11/27/2019  ?  IDA (iron deficiency anemia) 11/06/2019  ? Asthma, persistent controlled 10/05/2018  ? Recurrent infections 10/05/2018  ? Anxiety and depression 01/20/2018  ? Right upper lobe pulmonary nodule 01/10/2018  ? Nasal polyp, unspecified 01/10/2018  ? Left sided abdominal pain 12/13/2017  ? Impaired fasting glucose 12/12/2017  ? History of adenomatous polyp of colon 11/19/2017  ? Genetic testing 07/16/2017  ? Family history of breast cancer   ? Controlled type 2 diabetes mellitus without complication, without long-term current use of insulin ([place]) 05/28/2017  ? Personal history of radiation therapy 2019  ? Cancer [facility]) 2019  ? Malignant neoplasm of upper-outer quadrant of right breast in female, estrogen receptor positive ([place]) 03/20/2017  ? Crohn disease ([place]) 11/29/2016  ? Borderline diabetes 12/17/2015  ? Ex-smoker 11/18/2015  ? Chronic venous insufficiency  09/02/2015  ? Symptomatic varicose veins, right 09/02/2015  ? Chronic sinusitis 01/26/2015  ? Eustachian tube dysfunction 12/04/2014  ? Other allergic rhinitis 09/15/2014  ? Dyspnea 09/15/2014  ? Liver hemangioma 05/09/

## 2021-08-02 ENCOUNTER — Encounter: Payer: Self-pay | Admitting: Family Medicine

## 2021-08-02 DIAGNOSIS — B001 Herpesviral vesicular dermatitis: Secondary | ICD-10-CM

## 2021-08-02 MED ORDER — ACYCLOVIR 400 MG PO TABS: 400.0000 mg | ORAL_TABLET | Freq: Three times a day (TID) | ORAL | 1 refills | Status: DC | PRN

## 2021-08-08 ENCOUNTER — Encounter: Payer: Self-pay | Admitting: Family Medicine

## 2021-08-08 DIAGNOSIS — F5104 Psychophysiologic insomnia: Secondary | ICD-10-CM

## 2021-08-08 MED ORDER — ZOLPIDEM TARTRATE 5 MG PO TABS: 5.0000 mg | ORAL_TABLET | Freq: Every evening | ORAL | 3 refills | Status: DC | PRN

## 2021-08-09 ENCOUNTER — Encounter: Payer: Self-pay | Admitting: Cardiology

## 2021-08-09 ENCOUNTER — Ambulatory Visit: Payer: No Typology Code available for payment source

## 2021-08-09 ENCOUNTER — Other Ambulatory Visit: Payer: Self-pay

## 2021-08-09 MED ORDER — ENTRESTO 97-103 MG PO TABS: 1.0000 | ORAL_TABLET | Freq: Two times a day (BID) | ORAL | 4 refills | Status: DC

## 2021-08-11 ENCOUNTER — Ambulatory Visit: Payer: No Typology Code available for payment source

## 2021-08-11 DIAGNOSIS — C50411 Malignant neoplasm of upper-outer quadrant of right female breast: Secondary | ICD-10-CM

## 2021-08-11 DIAGNOSIS — M6281 Muscle weakness (generalized): Secondary | ICD-10-CM

## 2021-08-11 DIAGNOSIS — I890 Lymphedema, not elsewhere classified: Secondary | ICD-10-CM

## 2021-08-11 NOTE — Therapy (Signed)
?OUTPATIENT PHYSICAL THERAPY TREATMENT NOTE ? ? ?Patient Name: Erin Fields ?MRN: [id number] ?DOB:1965-06-21, 56 y.o., female ?Today's Date: 08/11/2021 ? ?PCP: Darreld Mclean, MD ?REFERRING PROVIDER: Alla Feeling, NP ? ?END OF SESSION:  ? PT End of Session - 08/11/21 1555   ? ? Visit Number 2   ? Number of Visits 9   ? Date for PT Re-Evaluation 08/29/21   ? PT Start Time 1505   ? PT Stop Time 1550   ? PT Time Calculation (min) 45 min   ? Activity Tolerance Patient tolerated treatment well   ? Behavior During Therapy [facility] for tasks assessed/performed   ? ?  ?  ? ?  ? ? ?Past Medical History:  ?Diagnosis Date  ? Allergic rhinitis due to animal (cat) (dog) hair and dander 04/27/2020  ? Allergic rhinitis due to pollen 04/27/2020  ? Anxiety   ? Anxiety and depression 01/20/2018  ? Asthma   ? Asthma, persistent controlled 10/05/2018  ? B12 deficiency 11/27/2019  ? Borderline diabetes 12/17/2015  ? Breast cancer ([place])   ? Cancer [facility]) 2019  ? right breast cancer  ? CERVICAL CANCER, HX OF 12/06/2006  ? Qualifier: Diagnosis of  By: Arnoldo Morale MD, Balinda Quails   ? Chronic sinusitis 01/26/2015  ? Chronic venous insufficiency 09/02/2015  ? Colon polyps   ? Congestive heart failure ([place]) 04/29/2020  ? Controlled type 2 diabetes mellitus without complication, without long-term current use of insulin ([name]) 05/28/2017  ? Coronary artery disease 25% of RCA, 20% intermediate branch, 40% diagonal branch based on cardiac cath from 2022 07/22/2020  ? Crohn disease ([place]) 11/29/2016  ? Depression   ? Diabetes mellitus without complication ([place])   ? type 2  ? Dilated cardiomyopathy ([place]) ejection fraction 25% by echocardiogram from January 2022 04/29/2020  ? Dyspnea 09/15/2014  ? Elevated triglycerides with high cholesterol 11/26/2013  ? Environmental allergies   ? Eustachian tube dysfunction 12/04/2014  ? Ex-smoker 11/18/2015  ? Family history of breast cancer   ? Genetic testing 07/16/2017  ? Negative genetic testing on the multi-cancer  panel.  The Multi-Gene Panel offered by [name] includes sequencing and/or deletion duplication testing of the following 83 genes: ALK, APC, ATM, AXIN2,BAP1,  BARD1, BLM, BMPR1A, BRCA1, BRCA2, BRIP1, CASR, CDC73, CDH1, CDK4, CDKN1B, CDKN1C, CDKN2A (p14ARF), CDKN2A (p16INK4a), CEBPA, CHEK2, CTNNA1, DICER1, DIS3L2, EGFR (c.2369C>T, p.Thr790Met variant onl  ? GERD (gastroesophageal reflux disease)   ? HEADACHE 12/06/2006  ? Qualifier: Diagnosis of  By: Arnoldo Morale MD, Balinda Quails   ? Hemothorax on left 10/02/2020  ? History of adenomatous polyp of colon 11/19/2017  ? Hyperlipidemia   ? Hypertension   ? Hypothyroidism   ? IDA (iron deficiency anemia) 11/06/2019  ? Impaired fasting glucose 12/12/2017  ? Insomnia 08/20/2013  ? Irritable bowel syndrome 11/29/2006  ? Qualifier: Diagnosis of  By: Arnoldo Morale MD, Balinda Quails   ? Left sided abdominal pain 12/13/2017  ? Liver hemangioma 08/24/2014  ? Malignant neoplasm of upper-outer quadrant of right breast in female, estrogen receptor positive ([place]) 03/20/2017  ? Migraines   ? Mild persistent asthma, uncomplicated 17/71/1657  ? Nasal polyp, unspecified 01/10/2018  ? Oral herpes 04/20/2013  ? Other allergic rhinitis 09/15/2014  ? 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 Personal history of radiation therapy 2019  ? 36 radiation tx  ? PONV (postoperative nausea and vomiting)   ? scopolamine patch helps  ? Recurrent infections 10/05/2018  ? Right upper lobe pulmonary nodule  01/10/2018  ? Symptomatic varicose veins, right 09/02/2015  ? Ulcerative colitis (H[facility])   ? Varicose veins   ? ?Past Surgical History:  ?Procedure Laterality Date  ? BREAST BIOPSY Right 02/2017  ? BREAST BIOPSY Right 11/07/2019  ? BREAST LUMPECTOMY Right 04/24/2017  ? BREAST LUMPECTOMY WITH RADIOACTIVE SEED AND SENTINEL LYMPH NODE BIOPSY Right 04/24/2017  ? Procedure: RIGHT BREAST LUMPECTOMY WITH RADIOACTIVE SEED AND SENTINEL LYMPH NODE BIOPSY;  Surgeon: Byerly, Faera, MD;  Location: [facility];  Service: General;  Laterality: Right;  ? CORONARY ARTERY BYPASS GRAFT    ? crapal tunnel relase Left 05/2018  ? ENDOMETRIAL ABLATION  2010  ? Had abnormal uterine bleeding (heavy and frequent), performed in [place], menses stopped completely 2 years after  ? EXPLORATORY LAPAROTOMY    ? Had exploratory surgery at the age of 15 for abdominal pain with cyst found on her left ovary, not treated surgically  ? FOOT SURGERY Right 2012  ? GENERATOR REMOVAL  10/02/2020  ? Procedure: GENERATOR REMOVAL;  Surgeon: Owen, Clarence H, MD;  [facility];  Service: Thoracic;;  ? HAND SURGERY Right   ? ICD IMPLANT N/A 10/01/2020  ? Procedure: ICD IMPLANT;  Surgeon: Camnitz, Will Martin, MD;  [facility];  Service: Cardiovascular;  Laterality: N/A;  ? ICD LEAD REMOVAL N/A 10/02/2020  ? Procedure: ICD LEAD REMOVAL;  Surgeon: Owen, Clarence H, MD;  [facility];  Service: Thoracic;  Laterality: N/A;  ? MEDIASTERNOTOMY N/A 10/02/2020  ? Procedure: MEDIAN STERNOTOMY REPAIR PERFORATED VENTRICLE EVACUATION LEFT HEMOTHORAX;  Surgeon: Owen, Clarence H, MD;  [facility];  Service: Thoracic;  Laterality: N/A;  ? RE-EXCISION OF BREAST LUMPECTOMY Right 05/15/2017  ? Procedure: RE-EXCISION OF BREAST LUMPECTOMY;  Surgeon: Byerly, Faera, MD;  Location: [facility];  Service: General;  Laterality: Right;  ? RIGHT/LEFT HEART CATH AND CORONARY ANGIOGRAPHY N/A 06/21/2020  ? Procedure: RIGHT/LEFT HEART CATH AND CORONARY ANGIOGRAPHY;  Surgeon: End, Christopher, MD;  [facility];  Service: Cardiovascular;  Laterality: N/A;  ? ROBOTIC ASSISTED SALPINGO OOPHERECTOMY Bilateral 08/29/2019  ? Procedure: XI ROBOTIC ASSISTED SALPINGO OOPHORECTOMY;  Surgeon: Tucker, Katherine R, MD;  Location: [facility];  Service: Gynecology;  Laterality: Bilateral;  ? Septoplasty with turbinate reduction    ? TEE WITHOUT CARDIOVERSION N/A 10/02/2020  ? Procedure: TRANSESOPHAGEAL ECHOCARDIOGRAM  (TEE);  Surgeon: Owen, Clarence H, MD;  [facility];  Service: Thoracic;  Laterality: N/A;  ? TONSILLECTOMY  1980  ? VEIN SURGERY    ? Removal Right Leg  ? WISDOM TOOTH EXTRACTION    ? ?Patient Active Problem List  ? Diagnosis Date Noted  ? Breast cancer ([facility]) 05/11/2021  ? Hemothorax on left 10/02/2020  ? S/P evacuation of hematoma 10/02/2020  ? 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 Hypovolemic shock ([facility]) 10/02/2020  ? Anxiety   ? Coronary artery disease 25% of RCA, 20% intermediate branch, 40% diagonal branch based on cardiac cath from 2022 07/22/2020  ? Dilated cardiomyopathy ([facility]) ejection fraction 25% by echocardiogram from January 2022 04/29/2020  ? Congestive heart failure ([facility]) 04/29/2020  ? Allergic rhinitis due to animal (cat) (dog) hair and dander 04/27/2020  ? Allergic rhinitis due to pollen 04/27/2020  ? Mild persistent asthma, uncomplicated 04/27/2020  ? PONV (postoperative nausea and vomiting)   ? Migraines   ? GERD (gastroesophageal reflux disease)   ? Environmental allergies   ? Diabetes mellitus without complication ([facility])   ?   Depression   ? Colon polyps   ? B12 deficiency 11/27/2019  ? IDA (iron deficiency anemia) 11/06/2019  ? Asthma, persistent controlled 10/05/2018  ? Recurrent infections 10/05/2018  ? Anxiety and depression 01/20/2018  ? Right upper lobe pulmonary nodule 01/10/2018  ? Nasal polyp, unspecified 01/10/2018  ? Left sided abdominal pain 12/13/2017  ? Impaired fasting glucose 12/12/2017  ? History of adenomatous polyp of colon 11/19/2017  ? Genetic testing 07/16/2017  ? Family history of breast cancer   ? Controlled type 2 diabetes mellitus without complication, without long-term current use of insulin ([place]) 05/28/2017  ? Personal history of radiation therapy 2019  ? Cancer [facility]) 2019  ? Malignant neoplasm of upper-outer quadrant of right breast in female, estrogen receptor positive ([place]) 03/20/2017  ? Crohn disease ([place]) 11/29/2016  ? Borderline  diabetes 12/17/2015  ? Ex-smoker 11/18/2015  ? Chronic venous insufficiency 09/02/2015  ? Symptomatic varicose veins, right 09/02/2015  ? Chronic sinusitis 01/26/2015  ? Eustachian tube dysfunction 12/04/2014

## 2021-08-15 ENCOUNTER — Encounter: Payer: Self-pay | Admitting: Family Medicine

## 2021-08-15 MED ORDER — HYDROCODONE-ACETAMINOPHEN 10-325 MG PO TABS: 0.5000 | ORAL_TABLET | Freq: Three times a day (TID) | ORAL | 0 refills | Status: DC | PRN

## 2021-08-16 ENCOUNTER — Telehealth (HOSPITAL_COMMUNITY): Payer: Self-pay | Admitting: *Deleted

## 2021-08-16 NOTE — Telephone Encounter (Signed)
Pt called asking if it was ok for her to have a colonoscopy.  ? ?Routed to [employer] for advice  ?

## 2021-08-16 NOTE — Telephone Encounter (Signed)
[place] for colonoscopy.  ?

## 2021-08-17 ENCOUNTER — Ambulatory Visit: Payer: No Typology Code available for payment source

## 2021-08-17 NOTE — Telephone Encounter (Signed)
Left vm for return call

## 2021-08-18 ENCOUNTER — Encounter: Payer: No Typology Code available for payment source | Admitting: Physical Therapy

## 2021-08-25 ENCOUNTER — Other Ambulatory Visit: Payer: Self-pay | Admitting: Family Medicine

## 2021-08-25 DIAGNOSIS — E782 Mixed hyperlipidemia: Secondary | ICD-10-CM

## 2021-08-28 ENCOUNTER — Other Ambulatory Visit: Payer: Self-pay | Admitting: Medical

## 2021-08-30 ENCOUNTER — Encounter: Payer: Self-pay | Admitting: Physical Therapy

## 2021-08-30 ENCOUNTER — Ambulatory Visit: Payer: No Typology Code available for payment source | Attending: Nurse Practitioner | Admitting: Physical Therapy

## 2021-08-30 DIAGNOSIS — I890 Lymphedema, not elsewhere classified: Secondary | ICD-10-CM | POA: Diagnosis present

## 2021-08-30 DIAGNOSIS — Z170 Estrogen receptor positive status [ER+]: Secondary | ICD-10-CM | POA: Diagnosis present

## 2021-08-30 DIAGNOSIS — M6281 Muscle weakness (generalized): Secondary | ICD-10-CM | POA: Insufficient documentation

## 2021-08-30 DIAGNOSIS — C50411 Malignant neoplasm of upper-outer quadrant of right female breast: Secondary | ICD-10-CM | POA: Diagnosis present

## 2021-08-30 NOTE — Therapy (Signed)
?OUTPATIENT PHYSICAL THERAPY TREATMENT NOTE ? ? ?Patient Name: Erin Fields ?MRN: [id number] ?DOB:[birth year], 56 y.o., female ?Today's Date: 08/30/2021 ? ?PCP: Darreld Mclean, MD ?REFERRING PROVIDER: Alla Feeling, NP ? ?END OF SESSION:  ? PT End of Session - 08/30/21 1502   ? ? Visit Number 3   ? Number of Visits 11   ? Date for PT Re-Evaluation 09/27/21   ? PT Start Time 1500   ? PT Stop Time 4098   ? PT Time Calculation (min) 53 min   ? Activity Tolerance Patient tolerated treatment well   ? Behavior During Therapy [facility] for tasks assessed/performed   ? ?  ?  ? ?  ? ? ?Past Medical History:  ?Diagnosis Date  ? Allergic rhinitis due to animal (cat) (dog) hair and dander 04/27/2020  ? Allergic rhinitis due to pollen 04/27/2020  ? Anxiety   ? Anxiety and depression 01/20/2018  ? Asthma   ? Asthma, persistent controlled 10/05/2018  ? B12 deficiency 11/27/2019  ? Borderline diabetes 12/17/2015  ? Breast cancer ([place])   ? Cancer [facility]) 2019  ? right breast cancer  ? CERVICAL CANCER, HX OF 12/06/2006  ? Qualifier: Diagnosis of  By: Arnoldo Morale MD, Balinda Quails   ? Chronic sinusitis 01/26/2015  ? Chronic venous insufficiency 09/02/2015  ? Colon polyps   ? Congestive heart failure ([place]) 04/29/2020  ? Controlled type 2 diabetes mellitus without complication, without long-term current use of insulin ([place]) 05/28/2017  ? Coronary artery disease 25% of RCA, 20% intermediate branch, 40% diagonal branch based on cardiac cath from 2022 07/22/2020  ? Crohn disease ([place]) 11/29/2016  ? Depression   ? Diabetes mellitus without complication ([place])   ? type 2  ? Dilated cardiomyopathy ([place]) ejection fraction 25% by echocardiogram from January 2022 04/29/2020  ? Dyspnea 09/15/2014  ? Elevated triglycerides with high cholesterol 11/26/2013  ? Environmental allergies   ? Eustachian tube dysfunction 12/04/2014  ? Ex-smoker 11/18/2015  ? Family history of breast cancer   ? Genetic testing 07/16/2017  ? Negative genetic testing on the multi-cancer  panel.  The Multi-Gene Panel offered by [name] includes sequencing and/or deletion duplication testing of the following 83 genes: ALK, APC, ATM, AXIN2,BAP1,  BARD1, BLM, BMPR1A, BRCA1, BRCA2, BRIP1, CASR, CDC73, CDH1, CDK4, CDKN1B, CDKN1C, CDKN2A (p14ARF), CDKN2A (p16INK4a), CEBPA, CHEK2, CTNNA1, DICER1, DIS3L2, EGFR (c.2369C>T, p.Thr790Met variant onl  ? GERD (gastroesophageal reflux disease)   ? HEADACHE 12/06/2006  ? Qualifier: Diagnosis of  By: Arnoldo Morale MD, Balinda Quails   ? Hemothorax on left 10/02/2020  ? History of adenomatous polyp of colon 11/19/2017  ? Hyperlipidemia   ? Hypertension   ? Hypothyroidism   ? IDA (iron deficiency anemia) 11/06/2019  ? Impaired fasting glucose 12/12/2017  ? Insomnia 08/20/2013  ? Irritable bowel syndrome 11/29/2006  ? Qualifier: Diagnosis of  By: Arnoldo Morale MD, Balinda Quails   ? Left sided abdominal pain 12/13/2017  ? Liver hemangioma 08/24/2014  ? Malignant neoplasm of upper-outer quadrant of right breast in female, estrogen receptor positive ([place]) 03/20/2017  ? Migraines   ? Mild persistent asthma, uncomplicated 11/91/4782  ? Nasal polyp, unspecified 01/10/2018  ? Oral herpes 04/20/2013  ? Other allergic rhinitis 09/15/2014  ? 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 Personal history of radiation therapy 2019  ? 36 radiation tx  ? PONV (postoperative nausea and vomiting)   ? scopolamine patch helps  ? Recurrent infections 10/05/2018  ? Right upper lobe pulmonary nodule  01/10/2018  ? Symptomatic varicose veins, right 09/02/2015  ? Ulcerative colitis (HCC)   ? Varicose veins   ? ?Past Surgical History:  ?Procedure Laterality Date  ? BREAST BIOPSY Right 02/2017  ? BREAST BIOPSY Right 11/07/2019  ? BREAST LUMPECTOMY Right 04/24/2017  ? BREAST LUMPECTOMY WITH RADIOACTIVE SEED AND SENTINEL LYMPH NODE BIOPSY Right 04/24/2017  ? Procedure: RIGHT BREAST LUMPECTOMY WITH RADIOACTIVE SEED AND SENTINEL LYMPH NODE BIOPSY;  Surgeon: Byerly, Faera, MD;  Location: [facility];  Service: General;  Laterality: Right;  ? CORONARY ARTERY BYPASS GRAFT    ? crapal tunnel relase Left 05/2018  ? ENDOMETRIAL ABLATION  2010  ? Had abnormal uterine bleeding (heavy and frequent), performed in [place], menses stopped completely 2 years after  ? EXPLORATORY LAPAROTOMY    ? Had exploratory surgery at the age of 15 for abdominal pain with cyst found on her left ovary, not treated surgically  ? FOOT SURGERY Right 2012  ? GENERATOR REMOVAL  10/02/2020  ? Procedure: GENERATOR REMOVAL;  Surgeon: Owen, Clarence H, MD;  Location: [facility];  Service: Thoracic;;  ? HAND SURGERY Right   ? ICD IMPLANT N/A 10/01/2020  ? Procedure: ICD IMPLANT;  Surgeon: Camnitz, Will Martin, MD;  Location: [facility];  Service: Cardiovascular;  Laterality: N/A;  ? ICD LEAD REMOVAL N/A 10/02/2020  ? Procedure: ICD LEAD REMOVAL;  Surgeon: Owen, Clarence H, MD;  Location: [facility];  Service: Thoracic;  Laterality: N/A;  ? MEDIASTERNOTOMY N/A 10/02/2020  ? Procedure: MEDIAN STERNOTOMY REPAIR PERFORATED VENTRICLE EVACUATION LEFT HEMOTHORAX;  Surgeon: Owen, Clarence H, MD;  Location: [facility];  Service: Thoracic;  Laterality: N/A;  ? RE-EXCISION OF BREAST LUMPECTOMY Right 05/15/2017  ? Procedure: RE-EXCISION OF BREAST LUMPECTOMY;  Surgeon: Byerly, Faera, MD;  Location:  SURGERY CENTER;  Service: General;  Laterality: Right;  ? RIGHT/LEFT HEART CATH AND CORONARY ANGIOGRAPHY N/A 06/21/2020  ? Procedure: RIGHT/LEFT HEART CATH AND CORONARY ANGIOGRAPHY;  Surgeon: End, Christopher, MD;  Location: [facility];  Service: Cardiovascular;  Laterality: N/A;  ? ROBOTIC ASSISTED SALPINGO OOPHERECTOMY Bilateral 08/29/2019  ? Procedure: XI ROBOTIC ASSISTED SALPINGO OOPHORECTOMY;  Surgeon: Tucker, Katherine R, MD;  Location: [facility];  Service: Gynecology;  Laterality: Bilateral;  ? Septoplasty with turbinate reduction    ? TEE WITHOUT CARDIOVERSION N/A 10/02/2020  ? Procedure: TRANSESOPHAGEAL ECHOCARDIOGRAM  (TEE);  Surgeon: Owen, Clarence H, MD;  Location: [facility];  Service: Thoracic;  Laterality: N/A;  ? TONSILLECTOMY  1980  ? VEIN SURGERY    ? Removal Right Leg  ? WISDOM TOOTH EXTRACTION    ? ?Patient Active Problem List  ? Diagnosis Date Noted  ? Breast cancer (HCC) 05/11/2021  ? Hemothorax on left 10/02/2020  ? S/P evacuation of hematoma 10/02/2020  ? 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 Hypovolemic shock (HCC) 10/02/2020  ? Anxiety   ? Coronary artery disease 25% of RCA, 20% intermediate branch, 40% diagonal branch based on cardiac cath from 2022 07/22/2020  ? Dilated cardiomyopathy (HCC) ejection fraction 25% by echocardiogram from January 2022 04/29/2020  ? Congestive heart failure (HCC) 04/29/2020  ? Allergic rhinitis due to animal (cat) (dog) hair and dander 04/27/2020  ? Allergic rhinitis due to pollen 04/27/2020  ? Mild persistent asthma, uncomplicated 04/27/2020  ? PONV (postoperative nausea and vomiting)   ? Migraines   ? GERD (gastroesophageal reflux disease)   ? Environmental allergies   ? Diabetes mellitus without complication (HCC)   ?   Depression   ? Colon polyps   ? B12 deficiency 11/27/2019  ? IDA (iron deficiency anemia) 11/06/2019  ? Asthma, persistent controlled 10/05/2018  ? Recurrent infections 10/05/2018  ? Anxiety and depression 01/20/2018  ? Right upper lobe pulmonary nodule 01/10/2018  ? Nasal polyp, unspecified 01/10/2018  ? Left sided abdominal pain 12/13/2017  ? Impaired fasting glucose 12/12/2017  ? History of adenomatous polyp of colon 11/19/2017  ? Genetic testing 07/16/2017  ? Family history of breast cancer   ? Controlled type 2 diabetes mellitus without complication, without long-term current use of insulin (HCC) 05/28/2017  ? Personal history of radiation therapy 2019  ? Cancer (HCC) 2019  ? Malignant neoplasm of upper-outer quadrant of right breast in female, 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  ? Crohn disease (HCC) 11/29/2016  ? Borderline  diabetes 12/17/2015  ? Ex-smoker 11/18/2015  ? Chronic venous insufficiency 09/02/2015  ? Symptomatic varicose veins, right 09/02/2015  ? Chronic sinusitis 01/26/2015  ? Eustachian tube dysfunction 08/19/201

## 2021-09-01 ENCOUNTER — Ambulatory Visit: Payer: No Typology Code available for payment source

## 2021-09-01 DIAGNOSIS — M6281 Muscle weakness (generalized): Secondary | ICD-10-CM

## 2021-09-01 DIAGNOSIS — I890 Lymphedema, not elsewhere classified: Secondary | ICD-10-CM

## 2021-09-01 DIAGNOSIS — Z170 Estrogen receptor positive status [ER+]: Secondary | ICD-10-CM

## 2021-09-01 NOTE — Therapy (Signed)
OUTPATIENT PHYSICAL THERAPY TREATMENT NOTE   Patient Name: Erin Fields MRN: [id number] DOB:1966-02-28, 56 y.o., female Today's Date: 09/01/2021  PCP: Darreld Mclean, MD REFERRING PROVIDER: Alla Feeling, NP  END OF SESSION:   PT End of Session - 09/01/21 1454     Visit Number 4    Number of Visits 11    Date for PT Re-Evaluation 09/27/21    PT Start Time 1500    PT Stop Time 1545    PT Time Calculation (min) 45 min    Activity Tolerance Patient tolerated treatment well    Behavior During Therapy [facility] for tasks assessed/performed             Past Medical History:  Diagnosis Date   Allergic rhinitis due to animal (cat) (dog) hair and dander 04/27/2020   Allergic rhinitis due to pollen 04/27/2020   Anxiety    Anxiety and depression 01/20/2018   Asthma    Asthma, persistent controlled 10/05/2018   B12 deficiency 11/27/2019   Borderline diabetes 12/17/2015   Breast cancer ([place])    Cancer ([place]) 2019   right breast cancer   CERVICAL CANCER, HX OF 12/06/2006   Qualifier: Diagnosis of  By: Arnoldo Morale MD, John E    Chronic sinusitis 01/26/2015   Chronic venous insufficiency 09/02/2015   Colon polyps    Congestive heart failure ([place]) 04/29/2020   Controlled type 2 diabetes mellitus without complication, without long-term current use of insulin ([place]) 05/28/2017   Coronary artery disease 25% of RCA, 20% intermediate branch, 40% diagonal branch based on cardiac cath from 2022 07/22/2020   Crohn disease ([place]) 11/29/2016   Depression    Diabetes mellitus without complication ([place])    type 2   Dilated cardiomyopathy ([place]) ejection fraction 25% by echocardiogram from January 2022 04/29/2020   Dyspnea 09/15/2014   Elevated triglycerides with high cholesterol 11/26/2013   Environmental allergies    Eustachian tube dysfunction 12/04/2014   Ex-smoker 11/18/2015   Family history of breast cancer    Genetic testing 07/16/2017   Negative genetic testing on the multi-cancer  panel.  The Multi-Gene Panel offered by [name] includes sequencing and/or deletion duplication testing of the following 83 genes: ALK, APC, ATM, AXIN2,BAP1,  BARD1, BLM, BMPR1A, BRCA1, BRCA2, BRIP1, CASR, CDC73, CDH1, CDK4, CDKN1B, CDKN1C, CDKN2A (p14ARF), CDKN2A (p16INK4a), CEBPA, CHEK2, CTNNA1, DICER1, DIS3L2, EGFR (c.2369C>T, p.Thr790Met variant onl   GERD (gastroesophageal reflux disease)    HEADACHE 12/06/2006   Qualifier: Diagnosis of  By: Arnoldo Morale MD, John E    Hemothorax on left 10/02/2020   History of adenomatous polyp of colon 11/19/2017   Hyperlipidemia    Hypertension    Hypothyroidism    IDA (iron deficiency anemia) 11/06/2019   Impaired fasting glucose 12/12/2017   Insomnia 08/20/2013   Irritable bowel syndrome 11/29/2006   Qualifier: Diagnosis of  By: Arnoldo Morale MD, John E    Left sided abdominal pain 12/13/2017   Liver hemangioma 08/24/2014   Malignant neoplasm of upper-outer quadrant of right breast in female, estrogen receptor positive ([place]) 03/20/2017   Migraines    Mild persistent asthma, uncomplicated 68/02/5725   Nasal polyp, unspecified 01/10/2018   Oral herpes 04/20/2013   Other allergic rhinitis 09/15/2014   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 10/02/2020   Personal history of radiation therapy 2019   36 radiation tx   PONV (postoperative nausea and vomiting)    scopolamine patch helps   Recurrent infections 10/05/2018   Right upper lobe pulmonary nodule  01/10/2018   Symptomatic varicose veins, right 09/02/2015   Ulcerative colitis ([place])    Varicose veins    Past Surgical History:  Procedure Laterality Date   BREAST BIOPSY Right 02/2017   BREAST BIOPSY Right 11/07/2019   BREAST LUMPECTOMY Right 04/24/2017   BREAST LUMPECTOMY WITH RADIOACTIVE SEED AND SENTINEL LYMPH NODE BIOPSY Right 04/24/2017   Procedure: RIGHT BREAST LUMPECTOMY WITH RADIOACTIVE SEED AND SENTINEL LYMPH NODE BIOPSY;  Surgeon: [name], MD;  Location: [place];  Service: General;  Laterality: Right;   CORONARY ARTERY BYPASS GRAFT     crapal tunnel relase Left 05/2018   ENDOMETRIAL ABLATION  2010   Had abnormal uterine bleeding (heavy and frequent), performed in [place], menses stopped completely 2 years after   EXPLORATORY LAPAROTOMY     Had exploratory surgery at the age of 44 for abdominal pain with cyst found on her left ovary, not treated surgically   FOOT SURGERY Right 2012   GENERATOR REMOVAL  10/02/2020   Procedure: GENERATOR REMOVAL;  Surgeon: Rexene Alberts, MD;  Location: [place];  Service: Thoracic;;   HAND SURGERY Right    ICD IMPLANT N/A 10/01/2020   Procedure: ICD IMPLANT;  Surgeon: Constance Haw, MD;  Location: [place] [facility];  Service: Cardiovascular;  Laterality: N/A;   ICD LEAD REMOVAL N/A 10/02/2020   Procedure: ICD LEAD REMOVAL;  Surgeon: Rexene Alberts, MD;  Location: [place];  Service: Thoracic;  Laterality: N/A;   MEDIASTERNOTOMY N/A 10/02/2020   Procedure: MEDIAN STERNOTOMY REPAIR PERFORATED VENTRICLE EVACUATION LEFT HEMOTHORAX;  Surgeon: Rexene Alberts, MD;  Location: [place];  Service: Thoracic;  Laterality: N/A;   RE-EXCISION OF BREAST LUMPECTOMY Right 05/15/2017   Procedure: RE-EXCISION OF BREAST LUMPECTOMY;  Surgeon: [name], MD;  Location: [place];  Service: General;  Laterality: Right;   RIGHT/LEFT HEART CATH AND CORONARY ANGIOGRAPHY N/A 06/21/2020   Procedure: RIGHT/LEFT HEART CATH AND CORONARY ANGIOGRAPHY;  Surgeon: Nelva Bush, MD;  Location: [place] [facility];  Service: Cardiovascular;  Laterality: N/A;   ROBOTIC ASSISTED SALPINGO OOPHERECTOMY Bilateral 08/29/2019   Procedure: XI ROBOTIC ASSISTED SALPINGO OOPHORECTOMY;  Surgeon: [name], MD;  Location[facility];  Service: Gynecology;  Laterality: Bilateral;   Septoplasty with turbinate reduction     TEE WITHOUT CARDIOVERSION N/A 10/02/2020   Procedure: TRANSESOPHAGEAL ECHOCARDIOGRAM  (TEE);  Surgeon: Rexene Alberts, MD;  Location: [place];  Service: Thoracic;  Laterality: N/A;   [place]     Removal Right Leg   WISDOM TOOTH EXTRACTION     Patient Active Problem List   Diagnosis Date Noted   Breast cancer ([place]) 05/11/2021   Hemothorax on left 10/02/2020   S/P evacuation of hematoma 10/02/2020   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 10/02/2020   Hypovolemic shock ([place]) 10/02/2020   Anxiety    Coronary artery disease 25% of RCA, 20% intermediate branch, 40% diagonal branch based on cardiac cath from 2022 07/22/2020   Dilated cardiomyopathy ([place]) ejection fraction 25% by echocardiogram from January 2022 04/29/2020   Congestive heart failure ([place]) 04/29/2020   Allergic rhinitis due to animal (cat) (dog) hair and dander 04/27/2020   Allergic rhinitis due to pollen 04/27/2020   Mild persistent asthma, uncomplicated 26/71/2458   PONV (postoperative nausea and vomiting)    Migraines    GERD (gastroesophageal reflux disease)    Environmental allergies    Diabetes mellitus without complication ([place])  Depression    Colon polyps    B12 deficiency 11/27/2019   IDA (iron deficiency anemia) 11/06/2019   Asthma, persistent controlled 10/05/2018   Recurrent infections 10/05/2018   Anxiety and depression 01/20/2018   Right upper lobe pulmonary nodule 01/10/2018   Nasal polyp, unspecified 01/10/2018   Left sided abdominal pain 12/13/2017   Impaired fasting glucose 12/12/2017   History of adenomatous polyp of colon 11/19/2017   Genetic testing 07/16/2017   Family history of breast cancer    Controlled type 2 diabetes mellitus without complication, without long-term current use of insulin ([place]) 05/28/2017   Personal history of radiation therapy 2019   Cancer ([place]) 2019   Malignant neoplasm of upper-outer quadrant of right breast in female, estrogen receptor positive ([place]) 03/20/2017   Crohn disease ([place]) 11/29/2016   Borderline  diabetes 12/17/2015   Ex-smoker 11/18/2015   Chronic venous insufficiency 09/02/2015   Symptomatic varicose veins, right 09/02/2015   Chronic sinusitis 01/26/2015   Eustachian tube dysfunction 12/04/2014   Other allergic rhinitis 09/15/2014   Dyspnea 09/15/2014   Liver hemangioma 08/24/2014   Elevated triglycerides with high cholesterol 11/26/2013   Insomnia 08/20/2013   Hypertension 08/20/2013   Ulcerative colitis ([place]) 04/20/2013   Oral herpes 04/20/2013   Hyperlipidemia 12/06/2006   HEADACHE 12/06/2006   CERVICAL CANCER, HX OF 12/06/2006   Hypothyroidism 11/29/2006   Asthma 11/29/2006   IRRITABLE BOWEL SYNDROME 11/29/2006    REFERRING DIAG:  C50.411,Z17.0 (ICD-10-CM) - Malignant neoplasm of upper-outer quadrant of right breast in female, estrogen receptor positive ([place]  THERAPY DIAG:  Lymphedema, not elsewhere classified  Muscle weakness (generalized)  Malignant neoplasm of upper-outer quadrant of right breast in female, estrogen receptor positive ([place])  PERTINENT HISTORY: PERTINENT HISTORY:  R breast cancer diagnosed in 2019 (ER/PR+, HER2-), completed adj radiation, 04/24/17- R br lumpectomy with SLNB (1/3) then 05/15/17 - re excision of R breast lumpectomy, currently taking Exemestane due to poor tolerance of Letrozole, 10/01/20- ICD implant removal with open heart surgery and large tender scars. CHF  PRECAUTIONS: R breast lymphedema, tender scarring at mid chest from open heart surgery to remove ICD  SUBJECTIVE: The swelling is better and I can tell its moving by going to the inguinal region and not across the chest. Discomfort is much better.  I feel comfortable with the MLD and would like to do some strengthening today.   PAIN:  Are you having pain? No   OBJECTIVE: (objective measures completed at initial evaluation unless otherwise dated)    OBJECTIVE   COGNITION:            Overall cognitive status: Within functional limits for tasks assessed    PALPATION:  Increased scar tissue at right lateral breast scar, increased fibrosis at medial breast     POSTURE: forward head, rounded shoulders   UPPER EXTREMITY AROM/PROM: WFL bilaterally     UPPER EXTREMITY STRENGTH: 3+/5 for bilateral shoulder flex and abduction     LYMPHEDEMA ASSESSMENTS:    SURGERY TYPE/DATE:  04/24/17- R br lumpectomy with SLNB (1/3) then 05/15/17 - re excision of R breast lumpectomy,    NUMBER OF LYMPH NODES REMOVED: 3 - 1 was positive   CHEMOTHERAPY: none   RADIATION:completed   HORMONE TREATMENT: currently taking   INFECTIONS: none   LYMPHEDEMA ASSESSMENTS:    LANDMARK RIGHT  08/01/2021  10 cm proximal to olecranon process 23.4  Olecranon process 23.5  10 cm proximal to ulnar styloid process 20.3  Just proximal to ulnar styloid process  15.5  Across hand at thumb web space 18.1  At base of 2nd digit 6  (Blank rows = not tested)   LANDMARK LEFT  08/01/2021  10 cm proximal to olecranon process 24  Olecranon process 23.5  10 cm proximal to ulnar styloid process 21  Just proximal to ulnar styloid process 15.5  Across hand at thumb web space 18  At base of 2nd digit 6  (Blank rows = not tested)       QUICK DASH SURVEY:    Erin Fields - 08/01/21 0001       Open a tight or new jar Moderate difficulty     Do heavy household chores (wash walls, wash floors) Moderate difficulty     Carry a shopping bag or briefcase Moderate difficulty     Wash your back No difficulty     Use a knife to cut food No difficulty     Recreational activities in which you take some force or impact through your arm, shoulder, or hand (golf, hammering, tennis) No difficulty     During the past week, to what extent has your arm, shoulder or hand problem interfered with your normal social activities with family, friends, neighbors, or groups? Not at all     During the past week, to what extent has your arm, shoulder or hand problem limited your work or other regular daily activities Not at  all     Arm, shoulder, or hand pain. Mild     Tingling (pins and needles) in your arm, shoulder, or hand None     Difficulty Sleeping No difficulty     DASH Score 15.91 %                       TODAY'S TREATMENT \\  09/01/2021  Pt taken through [facility] strength handout including all flexibility, except butterfly stretch, core strength except quad stab exs, and strengthening exs except mini squats (did sit to stand), and step ups. Used 1# wt for strength exercises and did 10 reps with emphasis on proper breathing techniques. Performed dead lifts but had some difficulty with technique due to hip OA and was advised to discontinue if having difficulty keeping back in neutral position.Pt was encouraged to read the handout for progression and we will discuss next visit. Pt did her Flexi touch trial the last 15 min of session.    08/30/2021  MLD: In supine with head of table slightly elevated: Pt demonstrate the following and since she demonstrated independence therapist performed the remainder of the session:short neck, 5 diaphragmatic breaths, R inguinal nodes and establishment of axillo inguinal pathway, R breast then retracing steps and ending with LN's.  08/11/2021  MLD: In supine with head of table slightly elevated: Therapist performed technique first, then had pt practice:short neck, 5 diaphragmatic breaths, R inguinal nodes and establishment of axillo inguinal pathway, R breast  lateral done first then moving fluid towards pathways, then medial breast then retracing steps and ending with LN's.Pt educated in anatomy and physiology of the lymphatic system and need for very gentle light stretch- skipping anterior interaxillary anastomosis due to open heart surgery and scar that is tender to the touch in this area. Handout issued to pt.  08/01/21: MLD: In supine: short neck, 5 diaphragmatic breaths, R inguinal nodes and establishment of axillo inguinal pathway, R breast moving fluid towards  pathways then retracing steps while educating pt in anatomy and physiology of the lymphatic system and need  for very gentle light stretch- skipping anterior interaxillary anastomosis due to open heart surgery and scar that is tender to the touch in this area Edema management: cut 1/2 inch grey foam and placed in thick TG soft for pt to wear over lower sternum to keep bra from rubbing scar, issued info for pt to obtain a Solaris breast swell spot for additional compression, issued info about FlexiTouch compression pump   PATIENT EDUCATION:  Education details: anatomy and physiology of the lymphatic system, self MLD technique, Solaris breast swell spot, FlexiTouch compression pump Person educated: Patient Education method: Consulting civil engineer, Demonstration, Tactile cues, Verbal cues, and Handouts Education comprehension: verbalized understanding     HOME EXERCISE PROGRAM: Self MLD   ASSESSMENT:   CLINICAL IMPRESSION: Pt was educated in ABC strength and flexibility per handout.  She was able to return demonstrate exs with good form, but required occasional VC's for technique.  Right hip OA does alter her form somewhat and limits several exercises including butterfly stretch, dead lifts, and held mini squats and step ups. With exs standing against wall pt has difficulty maintaining even pelvis due to right hip OA      OBJECTIVE IMPAIRMENTS decreased strength, increased edema, postural dysfunction, and pain.    ACTIVITY LIMITATIONS none.    PERSONAL FACTORS No personal factors are also affecting patient's functional outcome.      REHAB POTENTIAL: Good   CLINICAL DECISION MAKING: Stable/uncomplicated   EVALUATION COMPLEXITY: Low   GOALS: Goals reviewed with patient? Yes   SHORT TERM GOALS: Target date: 08/15/2021   Pt will obtain a breast swell spot for additional compression for management of R breast lymphedema. Baseline: Goal status: MET 08/30/21- pt has this and uses it nightly   LONG  TERM GOALS: Target date: 08/29/2021   Pt will receive a trial of the FlexiTouch compression pump for long term management of lymphedema.  Baseline:  Goal status: MET - 09/01/21   2.  Pt will be independent in self MLD for long term management of lymphedema.  Baseline:  Goal status: MET 08/30/21   3.  Pt will report a 50% improvement in fibrosis and edema in R breast to allow improved comfort.  Baseline:  Goal status:MET 85%   4.  Pt will be independent in a home exercise program for UE strengthening and stretching  Baseline:  Goal status:ONGOING   PLAN: PT FREQUENCY: 2x/week   PT DURATION: 4 weeks   PLANNED INTERVENTIONS: Therapeutic exercises, Patient/Family education, Orthotic/Fit training, Manual lymph drainage, Compression bandaging, scar mobilization, Vasopneumatic device, and Manual therapy   PLAN FOR NEXT SESSION: review Strength ABC program and add supine scap since pt has met lymphedema goals     Claris Pong, PT 09/01/2021, 3:46 PM

## 2021-09-04 NOTE — Progress Notes (Unsigned)
[place] at [facility] [address] [place], [place] [address] [phone]  Date:  09/14/2021   Name:  Erin Fields   DOB:  09/08/1965   MRN:  [id number]  PCP:  Darreld Mclean, MD    Chief Complaint: No chief complaint on file.   History of Present Illness:  Erin Fields is a 56 y.o. very pleasant female patient who presents with the following:  Pt seen today for CPE- history significant for right-sided breast cancer, type 2 diabetes, nonobstructive coronary artery disease, hypertension, hyperlipidemia, end-stage hip arthritis, previous history of cardiomyopathy and ICD placement with right ventricular perforation Last seen by myself in March when she was ill   Seen by oncology last month  1. Malignant neoplasm of upper-outer quadrant of right breast, invasive lobular carcinoma, stage IA, pT2N0M0, ER/PR: Positive, HER2 negative, Grade II -Diagnosed on 03/16/2017, s/p right breast lumpectomy and re-excision for positive margin per Dr. [name] and adjuvant RT by Dr. [name] 06/26/2017-08/09/2017 -Due to low Oncotype recurrence score 3, adjuvant chemo was not recommended. - She started adjuvent anti-estrogen therapy with letrozole on 08/2017 but switched to Exemestane due to poor tolerance. Plan to continue for 7-10 years due to the lobular histology -9/22 mammo was negative  -Ms. Sacco is clinically doing well, tolerating exemestane.  Breast exam shows mild scar tissue and lymphedema.  He feels lymphedema has worsened since heart surgery, I will refer her back to PT.  Otherwise benign.  Labs are unremarkable.  Overall there is no clinical concern for breast cancer recurrence. -She is nearly 5 years from initial diagnosis, we reviewed the recurrence risk and lobular histologies.  She agrees to continue antiestrogen for a total of 7-10 years. -Continue surveillance, next visit in 6 months, or sooner if needed.    She was also seen  by cardiology last week for follow-up -previously during her preop evaluation she was noted to have abnormally low ejection fraction-this led to placement of an ICD in June 4765, which was complicated by right ventricular perforation.  This was repaired by CT surgery, and her ejection fraction eventually did improve to her ICD is no longer indicated Dilated cardiomyopathy ejection fraction 40 to 45% appears to be compensated on physical exam.  She is on guideline directed medical therapy which I will continue look hemodynamically compensated, [place] Heart Association difficult to judge because of hip problem. Coronary disease which is nonobstructive based on last cardiac catheterization.  She is not on appropriate medication which include antiplatelets therapy which I will continue. Dyslipidemia, she is on Crestor 20 which I will continue I did review K PN which show me LDL of 65 HDL 37 however this data is from last year we will schedule her to have fasting lipid profile done. Cardiovascular preop evaluation denies have any chest pain.  Hemodynamic compensated ejection fraction improved to 4045% I think she would be reasonable candidate proceed with surgery we do have to pay special attention for fluid status and meticulous hemodynamic monitoring.  Eye exam Colon cancer screening appears to be due Mammogram up-to-date Pap smear is up-to-date Lab work done last month, mild hyponatremia Can update A1c and lipids, thyroid if she would like Lab Results  Component Value Date   HGBA1C 6.6 (H) 02/07/2021    Patient Active Problem List   Diagnosis Date Noted   Breast cancer ([place]) 05/11/2021   Hemothorax on left 10/02/2020   S/P evacuation of hematoma  10/02/2020   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 10/02/2020   Hypovolemic shock ([place]) 10/02/2020   Anxiety    Coronary artery disease 25% of RCA, 20% intermediate branch, 40% diagonal branch based on cardiac cath from 2022 07/22/2020    Dilated cardiomyopathy ([place]) ejection fraction 25% by echocardiogram from January 2022 04/29/2020   Congestive heart failure ([place]) 04/29/2020   Allergic rhinitis due to animal (cat) (dog) hair and dander 04/27/2020   Allergic rhinitis due to pollen 04/27/2020   Mild persistent asthma, uncomplicated 41/74/0814   PONV (postoperative nausea and vomiting)    Migraines    GERD (gastroesophageal reflux disease)    Environmental allergies    Diabetes mellitus without complication ([place])    Depression    Colon polyps    B12 deficiency 11/27/2019   IDA (iron deficiency anemia) 11/06/2019   Asthma, persistent controlled 10/05/2018   Recurrent infections 10/05/2018   Anxiety and depression 01/20/2018   Right upper lobe pulmonary nodule 01/10/2018   Nasal polyp, unspecified 01/10/2018   Left sided abdominal pain 12/13/2017   Impaired fasting glucose 12/12/2017   History of adenomatous polyp of colon 11/19/2017   Genetic testing 07/16/2017   Family history of breast cancer    Controlled type 2 diabetes mellitus without complication, without long-term current use of insulin ([place]) 05/28/2017   Personal history of radiation therapy 2019   Cancer ([place]) 2019   Malignant neoplasm of upper-outer quadrant of right breast in female, estrogen receptor positive ([place]) 03/20/2017   Crohn disease ([place]) 11/29/2016   Borderline diabetes 12/17/2015   Ex-smoker 11/18/2015   Chronic venous insufficiency 09/02/2015   Symptomatic varicose veins, right 09/02/2015   Chronic sinusitis 01/26/2015   Eustachian tube dysfunction 12/04/2014   Other allergic rhinitis 09/15/2014   Dyspnea 09/15/2014   Liver hemangioma 08/24/2014   Elevated triglycerides with high cholesterol 11/26/2013   Insomnia 08/20/2013   Hypertension 08/20/2013   Ulcerative colitis ([place]) 04/20/2013   Oral herpes 04/20/2013   Hyperlipidemia 12/06/2006   HEADACHE 12/06/2006   CERVICAL CANCER, HX OF 12/06/2006   Hypothyroidism 11/29/2006    Asthma 11/29/2006   IRRITABLE BOWEL SYNDROME 11/29/2006    Past Medical History:  Diagnosis Date   Allergic rhinitis due to animal (cat) (dog) hair and dander 04/27/2020   Allergic rhinitis due to pollen 04/27/2020   Anxiety    Anxiety and depression 01/20/2018   Asthma    Asthma, persistent controlled 10/05/2018   B12 deficiency 11/27/2019   Borderline diabetes 12/17/2015   Breast cancer ([place])    Cancer ([place]) 2019   right breast cancer   CERVICAL CANCER, HX OF 12/06/2006   Qualifier: Diagnosis of  By: Arnoldo Morale MD, John E    Chronic sinusitis 01/26/2015   Chronic venous insufficiency 09/02/2015   Colon polyps    Congestive heart failure ([place]) 04/29/2020   Controlled type 2 diabetes mellitus without complication, without long-term current use of insulin ([place]) 05/28/2017   Coronary artery disease 25% of RCA, 20% intermediate branch, 40% diagonal branch based on cardiac cath from 2022 07/22/2020   Crohn disease ([place]) 11/29/2016   Depression    Diabetes mellitus without complication ([place])    type 2   Dilated cardiomyopathy ([place]) ejection fraction 25% by echocardiogram from January 2022 04/29/2020   Dyspnea 09/15/2014   Elevated triglycerides with high cholesterol 11/26/2013   Environmental allergies    Eustachian tube dysfunction 12/04/2014   Ex-smoker 11/18/2015   Family history of breast cancer    Genetic testing  07/16/2017   Negative genetic testing on the multi-cancer panel.  The Multi-Gene Panel offered by [name] includes sequencing and/or deletion duplication testing of the following 83 genes: ALK, APC, ATM, AXIN2,BAP1,  BARD1, BLM, BMPR1A, BRCA1, BRCA2, BRIP1, CASR, CDC73, CDH1, CDK4, CDKN1B, CDKN1C, CDKN2A (p14ARF), CDKN2A (p16INK4a), CEBPA, CHEK2, CTNNA1, DICER1, DIS3L2, EGFR (c.2369C>T, p.Thr790Met variant onl   GERD (gastroesophageal reflux disease)    HEADACHE 12/06/2006   Qualifier: Diagnosis of  By: Arnoldo Morale MD, John E    Hemothorax on left 10/02/2020   History of  adenomatous polyp of colon 11/19/2017   Hyperlipidemia    Hypertension    Hypothyroidism    IDA (iron deficiency anemia) 11/06/2019   Impaired fasting glucose 12/12/2017   Insomnia 08/20/2013   Irritable bowel syndrome 11/29/2006   Qualifier: Diagnosis of  By: Arnoldo Morale MD, John E    Left sided abdominal pain 12/13/2017   Liver hemangioma 08/24/2014   Malignant neoplasm of upper-outer quadrant of right breast in female, estrogen receptor positive ([place]) 03/20/2017   Migraines    Mild persistent asthma, uncomplicated 70/04/7492   Nasal polyp, unspecified 01/10/2018   Oral herpes 04/20/2013   Other allergic rhinitis 09/15/2014   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 10/02/2020   Personal history of radiation therapy 2019   36 radiation tx   PONV (postoperative nausea and vomiting)    scopolamine patch helps   Recurrent infections 10/05/2018   Right upper lobe pulmonary nodule 01/10/2018   Symptomatic varicose veins, right 09/02/2015   Ulcerative colitis ([place])    Varicose veins     Past Surgical History:  Procedure Laterality Date   BREAST BIOPSY Right 02/2017   BREAST BIOPSY Right 11/07/2019   BREAST LUMPECTOMY Right 04/24/2017   BREAST LUMPECTOMY WITH RADIOACTIVE SEED AND SENTINEL LYMPH NODE BIOPSY Right 04/24/2017   Procedure: RIGHT BREAST LUMPECTOMY WITH RADIOACTIVE SEED AND SENTINEL LYMPH NODE BIOPSY;  Surgeon: [name], MD;  Location: [facility];  Service: General;  Laterality: Right;   CORONARY ARTERY BYPASS GRAFT     crapal tunnel relase Left 05/2018   ENDOMETRIAL ABLATION  2010   Had abnormal uterine bleeding (heavy and frequent), performed in [place], menses stopped completely 2 years after   EXPLORATORY LAPAROTOMY     Had exploratory surgery at the age of 73 for abdominal pain with cyst found on her left ovary, not treated surgically   FOOT SURGERY Right 2012   GENERATOR REMOVAL  10/02/2020   Procedure: GENERATOR REMOVAL;  Surgeon:  Rexene Alberts, MD;  Location: [facility];  Service: Thoracic;;   HAND SURGERY Right    ICD IMPLANT N/A 10/01/2020   Procedure: ICD IMPLANT;  Surgeon: Constance Haw, MD;  Location: [facility];  Service: Cardiovascular;  Laterality: N/A;   ICD LEAD REMOVAL N/A 10/02/2020   Procedure: ICD LEAD REMOVAL;  Surgeon: Rexene Alberts, MD;  Location: [facility];  Service: Thoracic;  Laterality: N/A;   MEDIASTERNOTOMY N/A 10/02/2020   Procedure: MEDIAN STERNOTOMY REPAIR PERFORATED VENTRICLE EVACUATION LEFT HEMOTHORAX;  Surgeon: Rexene Alberts, MD;  Location: [facility];  Service: Thoracic;  Laterality: N/A;   RE-EXCISION OF BREAST LUMPECTOMY Right 05/15/2017   Procedure: RE-EXCISION OF BREAST LUMPECTOMY;  Surgeon: [name], MD;  Location: [facility];  Service: General;  Laterality: Right;   RIGHT/LEFT HEART CATH AND CORONARY ANGIOGRAPHY N/A 06/21/2020   Procedure: RIGHT/LEFT HEART CATH AND CORONARY ANGIOGRAPHY;  Surgeon: Nelva Bush, MD;  Location: [facility];  Service: Cardiovascular;  Laterality: N/A;   ROBOTIC ASSISTED SALPINGO OOPHERECTOMY Bilateral 08/29/2019   Procedure: XI ROBOTIC ASSISTED SALPINGO OOPHORECTOMY;  Surgeon: [name], MD;  Location: [facility];  Service: Gynecology;  Laterality: Bilateral;   Septoplasty with turbinate reduction     TEE WITHOUT CARDIOVERSION N/A 10/02/2020   Procedure: TRANSESOPHAGEAL ECHOCARDIOGRAM (TEE);  Surgeon: Rexene Alberts, MD;  Location[facility];  Service: Thoracic;  Laterality: N/A;   TONSILLECTOMY  1980   VEIN SURGERY     Removal Right Leg   WISDOM TOOTH EXTRACTION      Social History   Tobacco Use   Smoking status: Former    Packs/day: 3.00    Years: 15.00    Pack years: 45.00    Types: Cigarettes    Quit date: 09/14/1993    Years since quitting: 27.9   Smokeless tobacco: Never   Tobacco comments:    Quit 26 yrs ago  Vaping Use   Vaping Use: Never used  Substance Use Topics    Alcohol use: Yes    Alcohol/week: 1.0 - 2.0 standard drink    Types: 1 - 2 Glasses of wine per week    Comment: Drinks a couple every 2-3 months   Drug use: No    Family History  Problem Relation Age of Onset   Arthritis Mother 8       Deceased   Breast cancer Mother    Lung cancer Mother    Hyperlipidemia Mother    Heart disease Mother    Hypertension Mother    Diabetes Mother    Crohn's disease Mother    Diabetes Maternal Grandfather    Heart disease Maternal Grandfather    Heart attack Maternal Grandfather    Leukemia Maternal Grandfather    Colitis Sister    Leukemia Cousin 3       mat cousin   Lung cancer Father    Brain cancer Father     Allergies  Allergen Reactions   Losartan Potassium Shortness Of Breath and Other (See Comments)   Ace Inhibitors Cough   Aspirin Other (See Comments)    Upset stomach   Cefaclor Hives and Other (See Comments)   Oxycodone Hives   Sulfa Antibiotics Hives and Other (See Comments)   Lisinopril Cough and Other (See Comments)    Medication list has been reviewed and updated.  Current Outpatient Medications on File Prior to Visit  Medication Sig Dispense Refill   acyclovir (ZOVIRAX) 400 MG tablet Take 1 tablet (400 mg total) by mouth 3 (three) times daily as needed. 270 tablet 1   Adalimumab 80 MG/0.8ML PNKT Inject 80 mg into the skin every 14 (fourteen) days.     albuterol (VENTOLIN HFA) 108 (90 Base) MCG/ACT inhaler Inhale 2 puffs into the lungs every 6 (six) hours as needed for shortness of breath.     ascorbic acid (VITAMIN C) 1000 MG tablet Take 1,000 mg by mouth daily.     aspirin EC 81 MG tablet Take 81 mg by mouth daily. Swallow whole.     Azelastine HCl 0.15 % SOLN Place 2 sprays into both nostrils 2 (two) times daily. For 30 days     Calcium Carb-Cholecalciferol (CALCIUM+D3) 600-800 MG-UNIT TABS Take 1 tablet by mouth daily.     carvedilol (COREG) 12.5 MG tablet Take 1 tablet (12.5 mg total) by mouth 2 (two) times daily.  180 tablet 3   cetirizine (ZYRTEC) 10 MG tablet Take 10 mg by mouth  daily.     dapagliflozin propanediol (FARXIGA) 10 MG TABS tablet Take 1 tablet (10 mg total) by mouth daily before breakfast. 30 tablet 11   diphenoxylate-atropine (LOMOTIL) 2.5-0.025 MG tablet Take 2 tablets by mouth 4 (four) times daily as needed for diarrhea or loose stools.     EPINEPHrine 0.3 mg/0.3 mL IJ SOAJ injection Inject 0.3 mg into the muscle as needed for anaphylaxis.     exemestane (AROMASIN) 25 MG tablet TAKE 1 TABLET (25 MG TOTAL) BY MOUTH DAILY AFTER BREAKFAST. 90 tablet 3   HYDROcodone-acetaminophen (NORCO) 10-325 MG tablet Take 0.5-1 tablets by mouth every 8 (eight) hours as needed. 20 tablet 0   Hyoscyamine Sulfate 0.375 MG TBCR Take 0.375 mg by mouth in the morning and at bedtime.     ipratropium (ATROVENT) 0.06 % nasal spray Place 2 sprays into both nostrils 4 (four) times daily. Use as needed 15 mL 12   lansoprazole (PREVACID SOLUTAB) 15 MG disintegrating tablet Take 15 mg by mouth 2 (two) times daily before a meal.     levothyroxine (SYNTHROID) 75 MCG tablet Take 1 tablet (75 mcg total) by mouth daily before breakfast. 90 tablet 2   MAGNESIUM PO Take 250 mg by mouth daily.     meloxicam (MOBIC) 15 MG tablet Take 15 mg by mouth daily as needed for pain (Hip pain).     Menthol, Topical Analgesic, (BIOFREEZE COLORLESS) 4 % GEL Apply 1 application topically daily as needed (Hip pain).     mesalamine (LIALDA) 1.2 g EC tablet Take 2.4 g by mouth in the morning and at bedtime.      metFORMIN (GLUCOPHAGE) 500 MG tablet Take 1 tablet (500 mg total) by mouth 2 (two) times daily with a meal. 180 tablet 3   montelukast (SINGULAIR) 10 MG tablet Take 10 mg by mouth at bedtime.      Omega 3 1000 MG CAPS Take 2,000 mg by mouth every other day.     promethazine (PHENERGAN) 25 MG tablet TAKE 1/2-1 TABLET (12.5-25 MG TOTAL) BY MOUTH DAILY AS NEEDED FOR NAUSEA OR VOMITING. 30 tablet 5   rosuvastatin (CRESTOR) 20 MG tablet TAKE  1 TABLET BY MOUTH EVERY DAY 90 tablet 1   sacubitril-valsartan (ENTRESTO) 97-103 MG Take 1 tablet by mouth 2 (two) times daily. 60 tablet 4   spironolactone (ALDACTONE) 25 MG tablet Take 0.5 tablets (12.5 mg total) by mouth daily. (Patient taking differently: Take 25 mg by mouth daily.) 45 tablet 3   SYMBICORT 160-4.5 MCG/ACT inhaler INHALE 2 PUFFS INTO THE LUNGS TWICE A DAY 30.6 each 1   triamcinolone (NASACORT) 55 MCG/ACT AERO nasal inhaler Place 2 sprays into the nose daily.     venlafaxine XR (EFFEXOR-XR) 150 MG 24 hr capsule TAKE 1 CAPSULE BY MOUTH DAILY WITH BREAKFAST. 90 capsule 1   zinc gluconate 50 MG tablet Take 50 mg by mouth daily.     zolpidem (AMBIEN) 5 MG tablet Take 1 tablet (5 mg total) by mouth at bedtime as needed for sleep. TAKE 1 TABLET BY MOUTH EVERY DAY AT BEDTIME AS NEEDED FOR SLEEP 30 tablet 3   No current facility-administered medications on file prior to visit.    Review of Systems:  As per HPI- otherwise negative.   Physical Examination: There were no vitals filed for this visit. There were no vitals filed for this visit. There is no height or weight on file to calculate BMI. Ideal Body Weight:    GEN: no acute distress. HEENT:  Atraumatic, Normocephalic.  Ears and Nose: No external deformity. CV: RRR, No M/G/R. No JVD. No thrill. No extra heart sounds. PULM: CTA B, no wheezes, crackles, rhonchi. No retractions. No resp. distress. No accessory muscle use. ABD: S, NT, ND, +BS. No rebound. No HSM. EXTR: No c/c/e PSYCH: Normally interactive. Conversant.    Assessment and Plan: ***  Signed Lamar Blinks, MD

## 2021-09-05 ENCOUNTER — Telehealth: Payer: Self-pay | Admitting: *Deleted

## 2021-09-05 NOTE — Telephone Encounter (Signed)
Requesting office called back and said yes the original date for the surgery was 04/2021. This was postponed from the pt's end. Surgery is now TBD.

## 2021-09-05 NOTE — Telephone Encounter (Signed)
   Pre-operative Risk Assessment    Patient Name: Erin Fields  DOB: 11/28/1965 MRN: [id number]      Request for Surgical Clearance    Procedure:   RIGHT TOTAL HIP REPLACEMENT  Date of Surgery:  Clearance TBD  LEFT MESSAGE FOR CALL BACK WITH DATE AS THE CLEARANCE REQUEST STATES DOS: 05/03/21.                                Surgeon:  DR. Edmonia Lynch  Surgeon's Group or Practice Name:  [facility] Phone number:  [phone] Fax number:  [phone] ATTN: Jowanna HIGH   Type of Clearance Requested:   - Medical  PT IS ON ASA 81 MG DAILY; THOUGH NOT ON CLEARANCE FORM AS NEEDING TO BE HELD.   Type of Anesthesia:   CHOICE   Additional requests/questions:    Jiles Prows   09/05/2021, 1:10 PM

## 2021-09-05 NOTE — Telephone Encounter (Signed)
Patient has an appointment with Dr. Agustin Cree on 09/06/21. Will forward to provider and notate in appointment notes.

## 2021-09-06 ENCOUNTER — Ambulatory Visit: Payer: No Typology Code available for payment source | Admitting: Cardiology

## 2021-09-06 ENCOUNTER — Encounter: Payer: Self-pay | Admitting: Cardiology

## 2021-09-06 VITALS — BP 102/60 | HR 76 | Ht 63.5 in | Wt 165.0 lb

## 2021-09-06 DIAGNOSIS — I9789 Other postprocedural complications and disorders of the circulatory system, not elsewhere classified: Secondary | ICD-10-CM | POA: Diagnosis not present

## 2021-09-06 DIAGNOSIS — I420 Dilated cardiomyopathy: Secondary | ICD-10-CM | POA: Diagnosis not present

## 2021-09-06 DIAGNOSIS — S2699XA Other injury of heart, unspecified with or without hemopericardium, initial encounter: Secondary | ICD-10-CM

## 2021-09-06 DIAGNOSIS — S2699XD Other injury of heart, unspecified with or without hemopericardium, subsequent encounter: Secondary | ICD-10-CM | POA: Diagnosis not present

## 2021-09-06 DIAGNOSIS — I2510 Atherosclerotic heart disease of native coronary artery without angina pectoris: Secondary | ICD-10-CM | POA: Diagnosis not present

## 2021-09-06 NOTE — Progress Notes (Signed)
Cardiology Office Note:    Date:  09/06/2021   ID:  Erin Fields, DOB June 11, 1965, MRN [id number]  PCP:  Darreld Mclean, MD  Cardiologist:  Jenne Campus, MD    Referring MD: Darreld Mclean, MD   Chief Complaint  Patient presents with   Clearance TBD    History of Present Illness:    Erin Fields is a 56 y.o. female   with complex past medical history.  She was initially referred to [place] because of EKG being abnormal that was done before elective hip replacement surgery after that echocardiogram has been performed and surprising she was found to have 25% ejection fraction which is severely diminished, that was followed by cardiac catheterization which showed nonobstructive disease she was put on appropriate medication in spite of that her ejection fraction was still diminished, she was referred to our EP team for consideration for ICD.  ICD was implanted and everything was fine she left home however while at home she started having chest pain.  She ended going to the emergency room thinking that she may have discharged from the defibrillator, device has been interrogated no discharge has being identified she was discharged home, however presented back within the next few hours with shortness of breath.  She suffered from perforation of the right ventricle with injury of the artery leading to blood collection in the pleura.  She required open heart surgery with repair of the perforation of the right ventricle as well as evacuation of hematoma from the left pleura.  She comes today to my office for follow-up overall she seems to be doing well, she denies have any chest pain tightness squeezing pressure burning chest.  She said she can go to store and do shopping she gets sweaty when she does not but otherwise do well.  No palpitations no dizziness last echocardiogram reviewed from February which showed ejection fraction 4045%.  Past Medical History:  Diagnosis Date   Allergic  rhinitis due to animal (cat) (dog) hair and dander 04/27/2020   Allergic rhinitis due to pollen 04/27/2020   Anxiety    Anxiety and depression 01/20/2018   Asthma    Asthma, persistent controlled 10/05/2018   B12 deficiency 11/27/2019   Borderline diabetes 12/17/2015   Breast cancer ([place])    Cancer ([place]) 2019   right breast cancer   CERVICAL CANCER, HX OF 12/06/2006   Qualifier: Diagnosis of  By: Arnoldo Morale MD, John E    Chronic sinusitis 01/26/2015   Chronic venous insufficiency 09/02/2015   Colon polyps    Congestive heart failure ([place]) 04/29/2020   Controlled type 2 diabetes mellitus without complication, without long-term current use of insulin ([place]) 05/28/2017   Coronary artery disease 25% of RCA, 20% intermediate branch, 40% diagonal branch based on cardiac cath from 2022 07/22/2020   Crohn disease ([place]) 11/29/2016   Depression    Diabetes mellitus without complication ([place])    type 2   Dilated cardiomyopathy ([place]) ejection fraction 25% by echocardiogram from January 2022 04/29/2020   Dyspnea 09/15/2014   Elevated triglycerides with high cholesterol 11/26/2013   Environmental allergies    Eustachian tube dysfunction 12/04/2014   Ex-smoker 11/18/2015   Family history of breast cancer    Genetic testing 07/16/2017   Negative genetic testing on the multi-cancer panel.  The Multi-Gene Panel offered by [name] includes sequencing and/or deletion duplication testing of the following 83 genes: ALK, APC, ATM, AXIN2,BAP1,  BARD1, BLM, BMPR1A, BRCA1, BRCA2, BRIP1, CASR, CDC73, CDH1,  CDK4, CDKN1B, CDKN1C, CDKN2A (p14ARF), CDKN2A (p16INK4a), CEBPA, CHEK2, CTNNA1, DICER1, DIS3L2, EGFR (c.2369C>T, p.Thr790Met variant onl   GERD (gastroesophageal reflux disease)    HEADACHE 12/06/2006   Qualifier: Diagnosis of  By: Arnoldo Morale MD, John E    Hemothorax on left 10/02/2020   History of adenomatous polyp of colon 11/19/2017   Hyperlipidemia    Hypertension    Hypothyroidism    IDA (iron  deficiency anemia) 11/06/2019   Impaired fasting glucose 12/12/2017   Insomnia 08/20/2013   Irritable bowel syndrome 11/29/2006   Qualifier: Diagnosis of  By: Arnoldo Morale MD, John E    Left sided abdominal pain 12/13/2017   Liver hemangioma 08/24/2014   Malignant neoplasm of upper-outer quadrant of right breast in female, estrogen receptor positive ([place]) 03/20/2017   Migraines    Mild persistent asthma, uncomplicated 73/41/9379   Nasal polyp, unspecified 01/10/2018   Oral herpes 04/20/2013   Other allergic rhinitis 09/15/2014   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 10/02/2020   Personal history of radiation therapy 2019   36 radiation tx   PONV (postoperative nausea and vomiting)    scopolamine patch helps   Recurrent infections 10/05/2018   Right upper lobe pulmonary nodule 01/10/2018   Symptomatic varicose veins, right 09/02/2015   Ulcerative colitis ([place])    Varicose veins     Past Surgical History:  Procedure Laterality Date   BREAST BIOPSY Right 02/2017   BREAST BIOPSY Right 11/07/2019   BREAST LUMPECTOMY Right 04/24/2017   BREAST LUMPECTOMY WITH RADIOACTIVE SEED AND SENTINEL LYMPH NODE BIOPSY Right 04/24/2017   Procedure: RIGHT BREAST LUMPECTOMY WITH RADIOACTIVE SEED AND SENTINEL LYMPH NODE BIOPSY;  Surgeon: [name], MD;  Location: [facility];  Service: General;  Laterality: Right;   CORONARY ARTERY BYPASS GRAFT     crapal tunnel relase Left 05/2018   ENDOMETRIAL ABLATION  2010   Had abnormal uterine bleeding (heavy and frequent), performed in [place], menses stopped completely 2 years after   EXPLORATORY LAPAROTOMY     Had exploratory surgery at the age of 51 for abdominal pain with cyst found on her left ovary, not treated surgically   FOOT SURGERY Right 2012   GENERATOR REMOVAL  10/02/2020   Procedure: GENERATOR REMOVAL;  Surgeon: Rexene Alberts, MD;  Location: [facility];  Service: Thoracic;;   HAND SURGERY Right    ICD IMPLANT N/A  10/01/2020   Procedure: ICD IMPLANT;  Surgeon: Constance Haw, MD;  Location: [facility];  Service: Cardiovascular;  Laterality: N/A;   ICD LEAD REMOVAL N/A 10/02/2020   Procedure: ICD LEAD REMOVAL;  Surgeon: Rexene Alberts, MD;  Location: [facility];  Service: Thoracic;  Laterality: N/A;   MEDIASTERNOTOMY N/A 10/02/2020   Procedure: MEDIAN STERNOTOMY REPAIR PERFORATED VENTRICLE EVACUATION LEFT HEMOTHORAX;  Surgeon: Rexene Alberts, MD;  Location: [facility];  Service: Thoracic;  Laterality: N/A;   RE-EXCISION OF BREAST LUMPECTOMY Right 05/15/2017   Procedure: RE-EXCISION OF BREAST LUMPECTOMY;  Surgeon: [name], MD;  Location: [facility];  Service: General;  Laterality: Right;   RIGHT/LEFT HEART CATH AND CORONARY ANGIOGRAPHY N/A 06/21/2020   Procedure: RIGHT/LEFT HEART CATH AND CORONARY ANGIOGRAPHY;  Surgeon: Nelva Bush, MD;  Location: [facility];  Service: Cardiovascular;  Laterality: N/A;   ROBOTIC ASSISTED SALPINGO OOPHERECTOMY Bilateral 08/29/2019   Procedure: XI ROBOTIC ASSISTED SALPINGO OOPHORECTOMY;  Surgeon: [name], MD;  Location: [facility];  Service: Gynecology;  Laterality: Bilateral;   Septoplasty  with turbinate reduction     TEE WITHOUT CARDIOVERSION N/A 10/02/2020   Procedure: TRANSESOPHAGEAL ECHOCARDIOGRAM (TEE);  Surgeon: Rexene Alberts, MD;  Location: [facility];  Service: Thoracic;  Laterality: N/A;   [place]     Removal Right Leg   WISDOM TOOTH EXTRACTION      Current Medications: Current Meds  Medication Sig   acyclovir (ZOVIRAX) 400 MG tablet Take 1 tablet (400 mg total) by mouth 3 (three) times daily as needed. (Patient taking differently: Take 400 mg by mouth 3 (three) times daily as needed (out breaks).)   Adalimumab 80 MG/0.8ML PNKT Inject 80 mg into the skin every 14 (fourteen) days.   albuterol (VENTOLIN HFA) 108 (90 Base) MCG/ACT inhaler Inhale 2 puffs into the lungs  every 6 (six) hours as needed for shortness of breath.   ascorbic acid (VITAMIN C) 1000 MG tablet Take 1,000 mg by mouth daily.   aspirin EC 81 MG tablet Take 81 mg by mouth daily. Swallow whole.   Azelastine HCl 0.15 % SOLN Place 2 sprays into both nostrils 2 (two) times daily. For 30 days   Calcium Carb-Cholecalciferol (CALCIUM+D3) 600-800 MG-UNIT TABS Take 1 tablet by mouth daily.   carvedilol (COREG) 12.5 MG tablet Take 1 tablet (12.5 mg total) by mouth 2 (two) times daily.   cetirizine (ZYRTEC) 10 MG tablet Take 10 mg by mouth daily.   dapagliflozin propanediol (FARXIGA) 10 MG TABS tablet Take 1 tablet (10 mg total) by mouth daily before breakfast.   diphenoxylate-atropine (LOMOTIL) 2.5-0.025 MG tablet Take 2 tablets by mouth 4 (four) times daily as needed for diarrhea or loose stools.   EPINEPHrine 0.3 mg/0.3 mL IJ SOAJ injection Inject 0.3 mg into the muscle as needed for anaphylaxis.   exemestane (AROMASIN) 25 MG tablet TAKE 1 TABLET (25 MG TOTAL) BY MOUTH DAILY AFTER BREAKFAST. (Patient taking differently: Take 25 mg by mouth daily after breakfast. TAKE 1 TABLET (25 MG TOTAL) BY MOUTH DAILY AFTER BREAKFAST.)   HYDROcodone-acetaminophen (NORCO) 10-325 MG tablet Take 0.5-1 tablets by mouth every 8 (eight) hours as needed. (Patient taking differently: Take 0.5-1 tablets by mouth every 8 (eight) hours as needed for moderate pain or severe pain.)   Hyoscyamine Sulfate 0.375 MG TBCR Take 0.375 mg by mouth in the morning and at bedtime.   ipratropium (ATROVENT) 0.06 % nasal spray Place 2 sprays into both nostrils 4 (four) times daily. Use as needed   lansoprazole (PREVACID SOLUTAB) 15 MG disintegrating tablet Take 15 mg by mouth 2 (two) times daily before a meal.   levothyroxine (SYNTHROID) 75 MCG tablet Take 1 tablet (75 mcg total) by mouth daily before breakfast.   MAGNESIUM PO Take 250 mg by mouth daily.   meloxicam (MOBIC) 15 MG tablet Take 15 mg by mouth daily as needed for pain (Hip pain).    Menthol, Topical Analgesic, (BIOFREEZE COLORLESS) 4 % GEL Apply 1 application topically daily as needed (Hip pain).   mesalamine (LIALDA) 1.2 g EC tablet Take 2.4 g by mouth in the morning and at bedtime.    metFORMIN (GLUCOPHAGE) 500 MG tablet Take 1 tablet (500 mg total) by mouth 2 (two) times daily with a meal.   montelukast (SINGULAIR) 10 MG tablet Take 10 mg by mouth at bedtime.    Omega 3 1000 MG CAPS Take 2,000 mg by mouth every other day.   promethazine (PHENERGAN) 25 MG tablet TAKE 1/2-1 TABLET (12.5-25 MG TOTAL) BY MOUTH DAILY AS NEEDED  FOR NAUSEA OR VOMITING. (Patient taking differently: Take 12.5-25 mg by mouth as needed for nausea.)   rosuvastatin (CRESTOR) 20 MG tablet TAKE 1 TABLET BY MOUTH EVERY DAY (Patient taking differently: Take 20 mg by mouth daily.)   sacubitril-valsartan (ENTRESTO) 97-103 MG Take 1 tablet by mouth 2 (two) times daily.   spironolactone (ALDACTONE) 25 MG tablet Take 0.5 tablets (12.5 mg total) by mouth daily. (Patient taking differently: Take 25 mg by mouth daily.)   SYMBICORT 160-4.5 MCG/ACT inhaler INHALE 2 PUFFS INTO THE LUNGS TWICE A DAY (Patient taking differently: Inhale 2 puffs into the lungs in the morning and at bedtime.)   triamcinolone (NASACORT) 55 MCG/ACT AERO nasal inhaler Place 2 sprays into the nose daily.   venlafaxine XR (EFFEXOR-XR) 150 MG 24 hr capsule TAKE 1 CAPSULE BY MOUTH DAILY WITH BREAKFAST. (Patient taking differently: Take 150 mg by mouth daily with breakfast.)   zinc gluconate 50 MG tablet Take 50 mg by mouth daily.   zolpidem (AMBIEN) 5 MG tablet Take 1 tablet (5 mg total) by mouth at bedtime as needed for sleep. TAKE 1 TABLET BY MOUTH EVERY DAY AT BEDTIME AS NEEDED FOR SLEEP     Allergies:   Losartan potassium, Ace inhibitors, Aspirin, Cefaclor, Oxycodone, Sulfa antibiotics, and Lisinopril   Social History   Socioeconomic History   Marital status: Widowed    Spouse name: Not on file   Number of children: Not on file    Years of education: Not on file   Highest education level: Not on file  Occupational History   Not on file  Tobacco Use   Smoking status: Former    Packs/day: 3.00    Years: 15.00    Pack years: 45.00    Types: Cigarettes    Quit date: 09/14/1993    Years since quitting: 27.9   Smokeless tobacco: Never   Tobacco comments:    Quit 26 yrs ago  Vaping Use   Vaping Use: Never used  Substance and Sexual Activity   Alcohol use: Yes    Alcohol/week: 1.0 - 2.0 standard drink    Types: 1 - 2 Glasses of wine per week    Comment: Drinks a couple every 2-3 months   Drug use: No   Sexual activity: Not Currently    Comment: ablation  Other Topics Concern   Not on file  Social History Narrative   Not on file   Social Determinants of Health   Financial Resource Strain: Not on file  Food Insecurity: Not on file  Transportation Needs: Not on file  Physical Activity: Not on file  Stress: Not on file  Social Connections: Not on file     Family History: The patient's family history includes Arthritis (age of onset: 29) in her mother; Brain cancer in her father; Breast cancer in her mother; Colitis in her sister; Crohn's disease in her mother; Diabetes in her maternal grandfather and mother; Heart attack in her maternal grandfather; Heart disease in her maternal grandfather and mother; Hyperlipidemia in her mother; Hypertension in her mother; Leukemia in her maternal grandfather; Leukemia (age of onset: 69) in her cousin; Lung cancer in her father and mother. ROS:   Please see the history of present illness.    All 14 point review of systems negative except as described per history of present illness  EKGs/Labs/Other Studies Reviewed:      Recent Labs: 11/11/2020: Magnesium 1.6 02/07/2021: TSH 1.51 06/08/2021: B Natriuretic Peptide 22.6 07/20/2021: ALT 13; BUN 10;  Creatinine 0.82; Hemoglobin 13.4; Platelet Count 411; Potassium 4.6; Sodium 132  Recent Lipid Panel    Component Value  Date/Time   CHOL 128 08/10/2020 0936   TRIG 153 (H) 08/10/2020 0936   HDL 37 (L) 08/10/2020 0936   CHOLHDL 3.5 08/10/2020 0936   CHOLHDL 4 06/18/2019 0952   VLDL 31.8 06/18/2019 0952   LDLCALC 65 08/10/2020 0936   LDLDIRECT 101.0 12/02/2018 0955    Physical Exam:    VS:  BP 102/60 (BP Location: Left Arm, Patient Position: Sitting)   Pulse 76   Ht 5' 3.5" (1.613 m)   Wt 165 lb (74.8 kg)   SpO2 98%   BMI 28.77 kg/m     Wt Readings from Last 3 Encounters:  09/06/21 165 lb (74.8 kg)  07/20/21 163 lb 8 oz (74.2 kg)  06/29/21 162 lb 12.8 oz (73.8 kg)     GEN:  Well nourished, well developed in no acute distress HEENT: Normal NECK: No JVD; No carotid bruits LYMPHATICS: No lymphadenopathy CARDIAC: RRR, no murmurs, no rubs, no gallops RESPIRATORY:  Clear to auscultation without rales, wheezing or rhonchi  ABDOMEN: Soft, non-tender, non-distended MUSCULOSKELETAL:  No edema; No deformity  SKIN: Warm and dry LOWER EXTREMITIES: no swelling NEUROLOGIC:  Alert and oriented x 3 PSYCHIATRIC:  Normal affect   ASSESSMENT:    1. Dilated cardiomyopathy (HCC) ejection fraction 25% by echocardiogram from January 2022   2. Coronary artery disease involving native coronary artery of native heart without angina pectoris   3. 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  She is on guideline directed medical therapy which I will continue look hemodynamically compensated, [place] Heart Association difficult to judge because of hip problem. Coronary disease which is nonobstructive based on last cardiac catheterization.  She is not on appropriate medication which include antiplatelets therapy which I will continue. Dyslipidemia, she is on Crestor 20 which I will continue I did review K PN which show me LDL of 65 HDL 37 however this data is from  last year we will schedule her to have fasting lipid profile done. Cardiovascular preop evaluation denies have any chest pain.  Hemodynamic compensated ejection fraction improved to 4045% I think she would be reasonable candidate proceed with surgery we do have to pay special attention for fluid status and meticulous hemodynamic monitoring.   Medication Adjustments/Labs and Tests Ordered: Current medicines are reviewed at length with the patient today.  Concerns regarding medicines are outlined above.  No orders of the defined types were placed in this encounter.  Medication changes: No orders of the defined types were placed in this encounter.   Signed, Park Liter, MD, [facility] 09/06/2021 2:02 PM    [place]

## 2021-09-06 NOTE — Patient Instructions (Signed)
Medication Instructions:  Your physician recommends that you continue on your current medications as directed. Please refer to the Current Medication list given to you today.  *If you need a refill on your cardiac medications before your next appointment, please call your pharmacy*   Lab Work: None Ordered If you have labs (blood work) drawn today and your tests are completely normal, you will receive your results only by: [place] (if you have MyChart) OR A paper copy in the mail If you have any lab test that is abnormal or we need to change your treatment, we will call you to review the results.   Testing/Procedures: None Ordered   Follow-Up: At [facility], you and your health needs are our priority.  As part of our continuing mission to provide you with exceptional heart care, we have created designated Provider Care Teams.  These Care Teams include your primary Cardiologist (physician) and Advanced Practice Providers (APPs -  Physician Assistants and Nurse Practitioners) who all work together to provide you with the care you need, when you need it.  We recommend signing up for the patient portal called "MyChart".  Sign up information is provided on this After Visit Summary.  MyChart is used to connect with patients for Virtual Visits (Telemedicine).  Patients are able to view lab/test results, encounter notes, upcoming appointments, etc.  Non-urgent messages can be sent to your provider as well.   To learn more about what you can do with MyChart, go to [email or web address].    Your next appointment:   October 2023  The format for your next appointment:   In Person  Provider:   Jenne Campus, MD    Other Instructions NA

## 2021-09-08 ENCOUNTER — Ambulatory Visit: Payer: No Typology Code available for payment source | Admitting: Rehabilitation

## 2021-09-12 NOTE — Patient Instructions (Incomplete)
It was great to see you again today, Best of luck with your hip surgery Please see me in about 6 months assuming all is well

## 2021-09-13 ENCOUNTER — Other Ambulatory Visit (HOSPITAL_COMMUNITY): Payer: Self-pay | Admitting: Cardiology

## 2021-09-14 ENCOUNTER — Encounter: Payer: Self-pay | Admitting: Family Medicine

## 2021-09-14 ENCOUNTER — Ambulatory Visit: Payer: No Typology Code available for payment source | Admitting: Family Medicine

## 2021-09-14 VITALS — BP 100/60 | HR 73 | Temp 98.0°F | Resp 18 | Ht 63.0 in | Wt 164.0 lb

## 2021-09-14 DIAGNOSIS — E119 Type 2 diabetes mellitus without complications: Secondary | ICD-10-CM

## 2021-09-14 DIAGNOSIS — M25551 Pain in right hip: Secondary | ICD-10-CM

## 2021-09-14 DIAGNOSIS — I10 Essential (primary) hypertension: Secondary | ICD-10-CM

## 2021-09-14 DIAGNOSIS — E034 Atrophy of thyroid (acquired): Secondary | ICD-10-CM | POA: Diagnosis not present

## 2021-09-14 DIAGNOSIS — I420 Dilated cardiomyopathy: Secondary | ICD-10-CM

## 2021-09-14 DIAGNOSIS — D803 Selective deficiency of immunoglobulin G [IgG] subclasses: Secondary | ICD-10-CM

## 2021-09-14 DIAGNOSIS — E559 Vitamin D deficiency, unspecified: Secondary | ICD-10-CM | POA: Diagnosis not present

## 2021-09-14 DIAGNOSIS — J4521 Mild intermittent asthma with (acute) exacerbation: Secondary | ICD-10-CM

## 2021-09-14 DIAGNOSIS — E782 Mixed hyperlipidemia: Secondary | ICD-10-CM

## 2021-09-14 LAB — LIPID PANEL
Cholesterol: 132 mg/dL (ref 0–200)
HDL: 40.1 mg/dL (ref >39.00)
NonHDL: 92
Total CHOL/HDL Ratio: 3
Triglycerides: 202 mg/dL — ABNORMAL HIGH (ref 0.0–149.0)
VLDL: 40.4 mg/dL — ABNORMAL HIGH (ref 0.0–40.0)

## 2021-09-14 LAB — TSH: TSH: 1.04 u[IU]/mL (ref 0.35–5.50)

## 2021-09-14 LAB — HEMOGLOBIN A1C: Hgb A1c MFr Bld: 6.2 % (ref 4.6–6.5)

## 2021-09-14 LAB — LDL CHOLESTEROL, DIRECT: Direct LDL: 66 mg/dL

## 2021-09-14 LAB — VITAMIN D 25 HYDROXY (VIT D DEFICIENCY, FRACTURES): VITD: 12.49 ng/mL — ABNORMAL LOW (ref 30.00–100.00)

## 2021-09-14 MED ORDER — HYDROCODONE-ACETAMINOPHEN 10-325 MG PO TABS: 0.5000 | ORAL_TABLET | Freq: Three times a day (TID) | ORAL | 0 refills | Status: DC | PRN

## 2021-09-14 MED ORDER — VITAMIN D3 1.25 MG (50000 UT) PO CAPS: ORAL_CAPSULE | ORAL | 0 refills | Status: DC

## 2021-09-14 NOTE — Addendum Note (Signed)
Addended by: Lamar Blinks C on: 09/14/2021 05:46 PM   Modules accepted: Orders

## 2021-09-15 ENCOUNTER — Encounter: Payer: Self-pay | Admitting: Family Medicine

## 2021-09-15 LAB — IGG: IgG (Immunoglobin G), Serum: 517 mg/dL — ABNORMAL LOW (ref 600–1640)

## 2021-10-23 ENCOUNTER — Other Ambulatory Visit: Payer: Self-pay | Admitting: Family Medicine

## 2021-10-23 DIAGNOSIS — F430 Acute stress reaction: Secondary | ICD-10-CM

## 2021-10-24 DIAGNOSIS — I890 Lymphedema, not elsewhere classified: Secondary | ICD-10-CM | POA: Insufficient documentation

## 2021-10-24 DIAGNOSIS — E119 Type 2 diabetes mellitus without complications: Secondary | ICD-10-CM | POA: Insufficient documentation

## 2021-11-01 ENCOUNTER — Other Ambulatory Visit (HOSPITAL_COMMUNITY): Payer: Self-pay | Admitting: *Deleted

## 2021-11-01 ENCOUNTER — Encounter: Payer: Self-pay | Admitting: Family Medicine

## 2021-11-01 ENCOUNTER — Encounter (HOSPITAL_COMMUNITY): Payer: Self-pay | Admitting: Cardiology

## 2021-11-01 DIAGNOSIS — M25551 Pain in right hip: Secondary | ICD-10-CM

## 2021-11-01 MED ORDER — SPIRONOLACTONE 25 MG PO TABS: 25.0000 mg | ORAL_TABLET | Freq: Every day | ORAL | 3 refills | Status: DC

## 2021-11-01 MED ORDER — HYDROCODONE-ACETAMINOPHEN 10-325 MG PO TABS: 0.5000 | ORAL_TABLET | Freq: Three times a day (TID) | ORAL | 0 refills | Status: DC | PRN

## 2021-11-16 ENCOUNTER — Ambulatory Visit (HOSPITAL_BASED_OUTPATIENT_CLINIC_OR_DEPARTMENT_OTHER)
Admission: RE | Admit: 2021-11-16 | Discharge: 2021-11-16 | Disposition: A | Discharge status: Home / Self Care | Payer: No Typology Code available for payment source | Source: Ambulatory Visit | Attending: Family Medicine | Admitting: Family Medicine

## 2021-11-16 ENCOUNTER — Other Ambulatory Visit (HOSPITAL_COMMUNITY): Payer: No Typology Code available for payment source

## 2021-11-16 ENCOUNTER — Encounter: Payer: Self-pay | Admitting: Family Medicine

## 2021-11-16 ENCOUNTER — Ambulatory Visit: Payer: No Typology Code available for payment source | Admitting: Family Medicine

## 2021-11-16 VITALS — BP 112/76 | HR 83 | Temp 97.6°F | Resp 18 | Ht 63.0 in | Wt 163.8 lb

## 2021-11-16 DIAGNOSIS — J208 Acute bronchitis due to other specified organisms: Secondary | ICD-10-CM | POA: Insufficient documentation

## 2021-11-16 DIAGNOSIS — R051 Acute cough: Secondary | ICD-10-CM

## 2021-11-16 LAB — POC COVID19 BINAXNOW: SARS Coronavirus 2 Ag: NEGATIVE

## 2021-11-16 MED ORDER — DOXYCYCLINE HYCLATE 100 MG PO CAPS: 100.0000 mg | ORAL_CAPSULE | Freq: Two times a day (BID) | ORAL | 0 refills | Status: DC

## 2021-11-16 NOTE — Patient Instructions (Addendum)
Good to see you today- I will be in touch with your chest x-ray report  Please let me know if you are not feeling improved in the next few days- Sooner if worse

## 2021-11-16 NOTE — Progress Notes (Signed)
[facility] [place], [place], [place] [address] [phone]  Date:  11/16/2021   Name:  Erin Fields   DOB:  Jul 05, 1965   MRN:  [id number]  PCP:  Darreld Mclean, MD    Chief Complaint: Nasal Congestion (And coughing for a week, no covid test.)   History of Present Illness:  Erin Fields is a 56 y.o. very pleasant female patient who presents with the following:  Here today with illness for one week- last visit with myself in May - history significant for right-sided breast cancer, type 2 diabetes, nonobstructive coronary artery disease, hypertension, hyperlipidemia, end-stage hip arthritis, previous history of cardiomyopathy and ICD placement with right ventricular perforation  Coughing up green mucus No fever noted She feels fatigued This time of year she also tends to have allergies  All stable with her heart per her knowledge She is not using any OTC meds  No sick contacts   She is getting her hip replaced in about one month now -she cannot wait, her head has been very painful  Patient Active Problem List   Diagnosis Date Noted   Breast cancer ([place]) 05/11/2021   Hemothorax on left 10/02/2020   S/P evacuation of hematoma 10/02/2020   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 10/02/2020   Hypovolemic shock ([place]) 10/02/2020   Anxiety    Coronary artery disease 25% of RCA, 20% intermediate branch, 40% diagonal branch based on cardiac cath from 2022 07/22/2020   Dilated cardiomyopathy ([place]) ejection fraction 25% by echocardiogram from January 2022 04/29/2020   Congestive heart failure ([place]) 04/29/2020   Allergic rhinitis due to animal (cat) (dog) hair and dander 04/27/2020   Allergic rhinitis due to pollen 04/27/2020   Mild persistent asthma, uncomplicated 35/57/3220   PONV (postoperative nausea and vomiting)    Migraines    GERD (gastroesophageal reflux disease)    Environmental  allergies    Diabetes mellitus without complication ([place])    Depression    Colon polyps    B12 deficiency 11/27/2019   IDA (iron deficiency anemia) 11/06/2019   Asthma, persistent controlled 10/05/2018   Recurrent infections 10/05/2018   Anxiety and depression 01/20/2018   Right upper lobe pulmonary nodule 01/10/2018   Nasal polyp, unspecified 01/10/2018   Left sided abdominal pain 12/13/2017   Impaired fasting glucose 12/12/2017   History of adenomatous polyp of colon 11/19/2017   Genetic testing 07/16/2017   Family history of breast cancer    Controlled type 2 diabetes mellitus without complication, without long-term current use of insulin ([place]) 05/28/2017   Personal history of radiation therapy 2019   Cancer ([place]) 2019   Malignant neoplasm of upper-outer quadrant of right breast in female, estrogen receptor positive ([place]) 03/20/2017   Crohn disease ([place]) 11/29/2016   Borderline diabetes 12/17/2015   Ex-smoker 11/18/2015   Chronic venous insufficiency 09/02/2015   Symptomatic varicose veins, right 09/02/2015   Chronic sinusitis 01/26/2015   Eustachian tube dysfunction 12/04/2014   Other allergic rhinitis 09/15/2014   Dyspnea 09/15/2014   Liver hemangioma 08/24/2014   Elevated triglycerides with high cholesterol 11/26/2013   Insomnia 08/20/2013   Hypertension 08/20/2013   Ulcerative colitis ([place]) 04/20/2013   Oral herpes 04/20/2013   Hyperlipidemia 12/06/2006   HEADACHE 12/06/2006   CERVICAL CANCER, HX OF 12/06/2006   Hypothyroidism 11/29/2006   Asthma 11/29/2006   IRRITABLE BOWEL SYNDROME 11/29/2006    Past Medical History:  Diagnosis Date  Allergic rhinitis due to animal (cat) (dog) hair and dander 04/27/2020   Allergic rhinitis due to pollen 04/27/2020   Anxiety    Anxiety and depression 01/20/2018   Asthma    Asthma, persistent controlled 10/05/2018   B12 deficiency 11/27/2019   Borderline diabetes 12/17/2015   Breast cancer ([place])    Cancer ([place]) 2019    right breast cancer   CERVICAL CANCER, HX OF 12/06/2006   Qualifier: Diagnosis of  By: Arnoldo Morale MD, John E    Chronic sinusitis 01/26/2015   Chronic venous insufficiency 09/02/2015   Colon polyps    Congestive heart failure ([place]) 04/29/2020   Controlled type 2 diabetes mellitus without complication, without long-term current use of insulin ([place]) 05/28/2017   Coronary artery disease 25% of RCA, 20% intermediate branch, 40% diagonal branch based on cardiac cath from 2022 07/22/2020   Crohn disease ([place]) 11/29/2016   Depression    Diabetes mellitus without complication ([place])    type 2   Dilated cardiomyopathy ([place]) ejection fraction 25% by echocardiogram from January 2022 04/29/2020   Dyspnea 09/15/2014   Elevated triglycerides with high cholesterol 11/26/2013   Environmental allergies    Eustachian tube dysfunction 12/04/2014   Ex-smoker 11/18/2015   Family history of breast cancer    Genetic testing 07/16/2017   Negative genetic testing on the multi-cancer panel.  The Multi-Gene Panel offered by [name] includes sequencing and/or deletion duplication testing of the following 83 genes: ALK, APC, ATM, AXIN2,BAP1,  BARD1, BLM, BMPR1A, BRCA1, BRCA2, BRIP1, CASR, CDC73, CDH1, CDK4, CDKN1B, CDKN1C, CDKN2A (p14ARF), CDKN2A (p16INK4a), CEBPA, CHEK2, CTNNA1, DICER1, DIS3L2, EGFR (c.2369C>T, p.Thr790Met variant onl   GERD (gastroesophageal reflux disease)    HEADACHE 12/06/2006   Qualifier: Diagnosis of  By: Arnoldo Morale MD, John E    Hemothorax on left 10/02/2020   History of adenomatous polyp of colon 11/19/2017   Hyperlipidemia    Hypertension    Hypothyroidism    IDA (iron deficiency anemia) 11/06/2019   Impaired fasting glucose 12/12/2017   Insomnia 08/20/2013   Irritable bowel syndrome 11/29/2006   Qualifier: Diagnosis of  By: Arnoldo Morale MD, John E    Left sided abdominal pain 12/13/2017   Liver hemangioma 08/24/2014   Malignant neoplasm of upper-outer quadrant of right breast in female,  estrogen receptor positive ([place]) 03/20/2017   Migraines    Mild persistent asthma, uncomplicated 72/53/6644   Nasal polyp, unspecified 01/10/2018   Oral herpes 04/20/2013   Other allergic rhinitis 09/15/2014   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 10/02/2020   Personal history of radiation therapy 2019   36 radiation tx   PONV (postoperative nausea and vomiting)    scopolamine patch helps   Recurrent infections 10/05/2018   Right upper lobe pulmonary nodule 01/10/2018   Symptomatic varicose veins, right 09/02/2015   Ulcerative colitis ([place])    Varicose veins     Past Surgical History:  Procedure Laterality Date   BREAST BIOPSY Right 02/2017   BREAST BIOPSY Right 11/07/2019   BREAST LUMPECTOMY Right 04/24/2017   BREAST LUMPECTOMY WITH RADIOACTIVE SEED AND SENTINEL LYMPH NODE BIOPSY Right 04/24/2017   Procedure: RIGHT BREAST LUMPECTOMY WITH RADIOACTIVE SEED AND SENTINEL LYMPH NODE BIOPSY;  Surgeon: [name], MD;  Location: [facility];  Service: General;  Laterality: Right;   CORONARY ARTERY BYPASS GRAFT     crapal tunnel relase Left 05/2018   ENDOMETRIAL ABLATION  2010   Had abnormal uterine bleeding (heavy and frequent), performed in [place], menses stopped completely 2  years after   EXPLORATORY LAPAROTOMY     Had exploratory surgery at the age of 64 for abdominal pain with cyst found on her left ovary, not treated surgically   FOOT SURGERY Right 2012   GENERATOR REMOVAL  10/02/2020   Procedure: GENERATOR REMOVAL;  Surgeon: Rexene Alberts, MD;  Location: [facility];  Service: Thoracic;;   HAND SURGERY Right    ICD IMPLANT N/A 10/01/2020   Procedure: ICD IMPLANT;  Surgeon: Constance Haw, MD;  Location: [facility];  Service: Cardiovascular;  Laterality: N/A;   ICD LEAD REMOVAL N/A 10/02/2020   Procedure: ICD LEAD REMOVAL;  Surgeon: Rexene Alberts, MD;  Location: [facility];  Service: Thoracic;  Laterality: N/A;   MEDIASTERNOTOMY N/A  10/02/2020   Procedure: MEDIAN STERNOTOMY REPAIR PERFORATED VENTRICLE EVACUATION LEFT HEMOTHORAX;  Surgeon: Rexene Alberts, MD;  Location: [facility];  Service: Thoracic;  Laterality: N/A;   RE-EXCISION OF BREAST LUMPECTOMY Right 05/15/2017   Procedure: RE-EXCISION OF BREAST LUMPECTOMY;  Surgeon: [name], MD;  Location: [facility];  Service: General;  Laterality: Right;   RIGHT/LEFT HEART CATH AND CORONARY ANGIOGRAPHY N/A 06/21/2020   Procedure: RIGHT/LEFT HEART CATH AND CORONARY ANGIOGRAPHY;  Surgeon: Nelva Bush, MD;  Location: [facility];  Service: Cardiovascular;  Laterality: N/A;   ROBOTIC ASSISTED SALPINGO OOPHERECTOMY Bilateral 08/29/2019   Procedure: XI ROBOTIC ASSISTED SALPINGO OOPHORECTOMY;  Surgeon: [name], MD;  Location[facility];  Service: Gynecology;  Laterality: Bilateral;   Septoplasty with turbinate reduction     TEE WITHOUT CARDIOVERSION N/A 10/02/2020   Procedure: TRANSESOPHAGEAL ECHOCARDIOGRAM (TEE);  Surgeon: Rexene Alberts, MD;  Location: [facility];  Service: Thoracic;  Laterality: N/A;   TONSILLECTOMY  1980   VEIN SURGERY     Removal Right Leg   WISDOM TOOTH EXTRACTION      Social History   Tobacco Use   Smoking status: Former    Packs/day: 3.00    Years: 15.00    Total pack years: 45.00    Types: Cigarettes    Quit date: 09/14/1993    Years since quitting: 28.1   Smokeless tobacco: Never   Tobacco comments:    Quit 26 yrs ago  Vaping Use   Vaping Use: Never used  Substance Use Topics   Alcohol use: Yes    Alcohol/week: 1.0 - 2.0 standard drink of alcohol    Types: 1 - 2 Glasses of wine per week    Comment: Drinks a couple every 2-3 months   Drug use: No    Family History  Problem Relation Age of Onset   Arthritis Mother 75       Deceased   Breast cancer Mother    Lung cancer Mother    Hyperlipidemia Mother    Heart disease Mother    Hypertension Mother    Diabetes Mother    Crohn's  disease Mother    Diabetes Maternal Grandfather    Heart disease Maternal Grandfather    Heart attack Maternal Grandfather    Leukemia Maternal Grandfather    Colitis Sister    Leukemia Cousin 3       mat cousin   Lung cancer Father    Brain cancer Father     Allergies  Allergen Reactions   Losartan Potassium Shortness Of Breath and Other (See Comments)   Ace Inhibitors Cough   Aspirin Other (See Comments)    Upset stomach   Cefaclor Hives and Other (  See Comments)   Oxycodone Hives   Sulfa Antibiotics Hives and Other (See Comments)   Lisinopril Cough and Other (See Comments)    Medication list has been reviewed and updated.  Current Outpatient Medications on File Prior to Visit  Medication Sig Dispense Refill   acyclovir (ZOVIRAX) 400 MG tablet Take 1 tablet (400 mg total) by mouth 3 (three) times daily as needed. (Patient taking differently: Take 400 mg by mouth 3 (three) times daily as needed (out breaks).) 270 tablet 1   Adalimumab 80 MG/0.8ML PNKT Inject 80 mg into the skin every 14 (fourteen) days.     albuterol (VENTOLIN HFA) 108 (90 Base) MCG/ACT inhaler Inhale 2 puffs into the lungs every 6 (six) hours as needed for shortness of breath.     ascorbic acid (VITAMIN C) 1000 MG tablet Take 1,000 mg by mouth daily.     aspirin EC 81 MG tablet Take 81 mg by mouth daily. Swallow whole.     Azelastine HCl 0.15 % SOLN Place 2 sprays into both nostrils 2 (two) times daily. For 30 days     Calcium Carb-Cholecalciferol (CALCIUM+D3) 600-800 MG-UNIT TABS Take 1 tablet by mouth daily.     carvedilol (COREG) 12.5 MG tablet Take 1 tablet (12.5 mg total) by mouth 2 (two) times daily. 180 tablet 3   cetirizine (ZYRTEC) 10 MG tablet Take 10 mg by mouth daily.     Cholecalciferol (VITAMIN D3) 1.25 MG (50000 UT) CAPS Take 1 weekly for 12 weeks 12 capsule 0   dapagliflozin propanediol (FARXIGA) 10 MG TABS tablet Take 1 tablet (10 mg total) by mouth daily before breakfast. 30 tablet 11    diphenoxylate-atropine (LOMOTIL) 2.5-0.025 MG tablet Take 2 tablets by mouth 4 (four) times daily as needed for diarrhea or loose stools.     EPINEPHrine 0.3 mg/0.3 mL IJ SOAJ injection Inject 0.3 mg into the muscle as needed for anaphylaxis.     exemestane (AROMASIN) 25 MG tablet TAKE 1 TABLET (25 MG TOTAL) BY MOUTH DAILY AFTER BREAKFAST. (Patient taking differently: Take 25 mg by mouth daily after breakfast. TAKE 1 TABLET (25 MG TOTAL) BY MOUTH DAILY AFTER BREAKFAST.) 90 tablet 3   HYDROcodone-acetaminophen (NORCO) 10-325 MG tablet Take 0.5-1 tablets by mouth every 8 (eight) hours as needed for moderate pain or severe pain. 30 tablet 0   Hyoscyamine Sulfate 0.375 MG TBCR Take 0.375 mg by mouth in the morning and at bedtime.     ipratropium (ATROVENT) 0.06 % nasal spray Place 2 sprays into both nostrils 4 (four) times daily. Use as needed 15 mL 12   lansoprazole (PREVACID SOLUTAB) 15 MG disintegrating tablet Take 15 mg by mouth 2 (two) times daily before a meal.     levothyroxine (SYNTHROID) 75 MCG tablet Take 1 tablet (75 mcg total) by mouth daily before breakfast. 90 tablet 2   MAGNESIUM PO Take 250 mg by mouth daily.     meloxicam (MOBIC) 15 MG tablet Take 15 mg by mouth daily as needed for pain (Hip pain).     Menthol, Topical Analgesic, (BIOFREEZE COLORLESS) 4 % GEL Apply 1 application topically daily as needed (Hip pain).     mesalamine (LIALDA) 1.2 g EC tablet Take 2.4 g by mouth in the morning and at bedtime.      metFORMIN (GLUCOPHAGE) 500 MG tablet TAKE 1 TABLET BY MOUTH 2 TIMES DAILY WITH A MEAL. 180 tablet 3   montelukast (SINGULAIR) 10 MG tablet Take 10 mg by mouth at bedtime.  Omega 3 1000 MG CAPS Take 2,000 mg by mouth every other day.     promethazine (PHENERGAN) 25 MG tablet TAKE 1/2-1 TABLET (12.5-25 MG TOTAL) BY MOUTH DAILY AS NEEDED FOR NAUSEA OR VOMITING. (Patient taking differently: Take 12.5-25 mg by mouth as needed for nausea.) 30 tablet 5   rosuvastatin (CRESTOR) 20 MG  tablet TAKE 1 TABLET BY MOUTH EVERY DAY (Patient taking differently: Take 20 mg by mouth daily.) 90 tablet 1   sacubitril-valsartan (ENTRESTO) 97-103 MG Take 1 tablet by mouth 2 (two) times daily. 60 tablet 4   spironolactone (ALDACTONE) 25 MG tablet Take 1 tablet (25 mg total) by mouth daily. 90 tablet 3   SYMBICORT 160-4.5 MCG/ACT inhaler INHALE 2 PUFFS INTO THE LUNGS TWICE A DAY (Patient taking differently: Inhale 2 puffs into the lungs in the morning and at bedtime.) 30.6 each 1   triamcinolone (NASACORT) 55 MCG/ACT AERO nasal inhaler Place 2 sprays into the nose daily.     venlafaxine XR (EFFEXOR-XR) 150 MG 24 hr capsule TAKE 1 CAPSULE BY MOUTH DAILY WITH BREAKFAST. 90 capsule 1   zinc gluconate 50 MG tablet Take 50 mg by mouth daily.     zolpidem (AMBIEN) 5 MG tablet Take 1 tablet (5 mg total) by mouth at bedtime as needed for sleep. TAKE 1 TABLET BY MOUTH EVERY DAY AT BEDTIME AS NEEDED FOR SLEEP 30 tablet 3   No current facility-administered medications on file prior to visit.    Review of Systems:  As per HPI- otherwise negative.   Physical Examination: Vitals:   11/16/21 1419  BP: 112/76  Pulse: 83  Resp: 18  Temp: 97.6 F (36.4 C)  SpO2: 97%   Vitals:   11/16/21 1419  Weight: 163 lb 12.8 oz (74.3 kg)  Height: _0  (1.6 m)   Body mass index is 29.02 kg/m. Ideal Body Weight: Weight in (lb) to have BMI = 25: 140.8  GEN: no acute distress.  Looks well HEENT: Atraumatic, Normocephalic.  Ears and Nose: No external deformity. CV: RRR, No M/G/R. No JVD. No thrill. No extra heart sounds. PULM: CTA B, no wheezes, crackles, rhonchi. No retractions. No resp. distress. No accessory muscle use. ABD: S, NT, ND, +BS. No rebound. No HSM. EXTR: No c/c/e PSYCH: Normally interactive. Conversant.   Results for orders placed or performed in visit on 11/16/21  POC COVID-19 BinaxNow  Result Value Ref Range   SARS Coronavirus 2 Ag Negative Negative    Assessment and Plan: Acute  cough - Plan: POC COVID-19 BinaxNow  Acute bronchitis due to other specified organisms - Plan: doxycycline (VIBRAMYCIN) 100 MG capsule, DG Chest 2 View  Patient seen today for sick visit.  She has noted a productive cough for about a week.  Vital signs are normal, COVID test negative We decided ahead and treat her with doxycycline, obtain chest x-ray.  She is asked to let me know if not feeling better in the next few days, sooner if worse  Signed Lamar Blinks, MD  Received chest them as below, message to patient  DG Chest 2 View  Result Date: 11/16/2021 CLINICAL DATA:  Cough and congestion EXAM: CHEST - 2 VIEW COMPARISON:  Chest 11/01/2020 FINDINGS: The heart size and mediastinal contours are within normal limits. Both lungs are clear. The visualized skeletal structures are unremarkable. Surgical clips right axilla. IMPRESSION: No active cardiopulmonary disease. Electronically Signed   By: Franchot Gallo M.D.   On: 11/16/2021 15:01

## 2021-11-18 LAB — HM COLONOSCOPY

## 2021-11-25 NOTE — H&P (Signed)
HIP ARTHROPLASTY ADMISSION H&P  Patient ID: Erin Fields MRN: [id number] DOB/AGE: February 29, 1966 56 y.o.  Chief Complaint: right hip pain.  Planned Procedure Date: 12/20/21 Medical Clearance by Dr. Silvestre Mesi    Cardiac Clearance by Dr. Jenne Campus   HPI: Erin Fields is a 56 y.o. female who presents for evaluation of OA RIGHT HIP. The patient has a history of pain and functional disability in the right hip due to arthritis and has failed non-surgical conservative treatments for greater than 12 weeks to include NSAID's and/or analgesics, corticosteriod injections, use of assistive devices, and activity modification.  Onset of symptoms was gradual, starting 2 years ago with gradually worsening course since that time. The patient noted no past surgery on the right hip.  Patient currently rates pain at 10 out of 10 with activity. Patient has night pain, worsening of pain with activity and weight bearing, and pain that interferes with activities of daily living.  Patient has evidence of subchondral sclerosis, periarticular osteophytes, and joint space narrowing by imaging studies.  There is no active infection.  Past Medical History:  Diagnosis Date   Allergic rhinitis due to animal (cat) (dog) hair and dander 04/27/2020   Allergic rhinitis due to pollen 04/27/2020   Anxiety    Anxiety and depression 01/20/2018   Asthma    Asthma, persistent controlled 10/05/2018   B12 deficiency 11/27/2019   Borderline diabetes 12/17/2015   Breast cancer ([place])    Cancer ([place]) 2019   right breast cancer   CERVICAL CANCER, HX OF 12/06/2006   Qualifier: Diagnosis of  By: Arnoldo Morale MD, John E    Chronic sinusitis 01/26/2015   Chronic venous insufficiency 09/02/2015   Colon polyps    Congestive heart failure ([place]) 04/29/2020   Controlled type 2 diabetes mellitus without complication, without long-term current use of insulin ([place]) 05/28/2017   Coronary artery disease 25% of RCA, 20% intermediate  branch, 40% diagonal branch based on cardiac cath from 2022 07/22/2020   Crohn disease ([place]) 11/29/2016   Depression    Diabetes mellitus without complication ([place])    type 2   Dilated cardiomyopathy ([place]) ejection fraction 25% by echocardiogram from January 2022 04/29/2020   Dyspnea 09/15/2014   Elevated triglycerides with high cholesterol 11/26/2013   Environmental allergies    Eustachian tube dysfunction 12/04/2014   Ex-smoker 11/18/2015   Family history of breast cancer    Genetic testing 07/16/2017   Negative genetic testing on the multi-cancer panel.  The Multi-Gene Panel offered by [name] includes sequencing and/or deletion duplication testing of the following 83 genes: ALK, APC, ATM, AXIN2,BAP1,  BARD1, BLM, BMPR1A, BRCA1, BRCA2, BRIP1, CASR, CDC73, CDH1, CDK4, CDKN1B, CDKN1C, CDKN2A (p14ARF), CDKN2A (p16INK4a), CEBPA, CHEK2, CTNNA1, DICER1, DIS3L2, EGFR (c.2369C>T, p.Thr790Met variant onl   GERD (gastroesophageal reflux disease)    HEADACHE 12/06/2006   Qualifier: Diagnosis of  By: Arnoldo Morale MD, John E    Hemothorax on left 10/02/2020   History of adenomatous polyp of colon 11/19/2017   Hyperlipidemia    Hypertension    Hypothyroidism    IDA (iron deficiency anemia) 11/06/2019   Impaired fasting glucose 12/12/2017   Insomnia 08/20/2013   Irritable bowel syndrome 11/29/2006   Qualifier: Diagnosis of  By: Arnoldo Morale MD, John E    Left sided abdominal pain 12/13/2017   Liver hemangioma 08/24/2014   Malignant neoplasm of upper-outer quadrant of right breast in female, estrogen receptor positive ([place]) 03/20/2017   Migraines    Mild persistent asthma, uncomplicated 70/35/0093  Nasal polyp, unspecified 01/10/2018   Oral herpes 04/20/2013   Other allergic rhinitis 09/15/2014   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 10/02/2020   Personal history of radiation therapy 2019   36 radiation tx   PONV (postoperative nausea and vomiting)    scopolamine patch helps    Recurrent infections 10/05/2018   Right upper lobe pulmonary nodule 01/10/2018   Symptomatic varicose veins, right 09/02/2015   Ulcerative colitis ([place])    Varicose veins    Past Surgical History:  Procedure Laterality Date   BREAST BIOPSY Right 02/2017   BREAST BIOPSY Right 11/07/2019   BREAST LUMPECTOMY Right 04/24/2017   BREAST LUMPECTOMY WITH RADIOACTIVE SEED AND SENTINEL LYMPH NODE BIOPSY Right 04/24/2017   Procedure: RIGHT BREAST LUMPECTOMY WITH RADIOACTIVE SEED AND SENTINEL LYMPH NODE BIOPSY;  Surgeon: [name], MD;  Location: [facility];  Service: General;  Laterality: Right;   CORONARY ARTERY BYPASS GRAFT     crapal tunnel relase Left 05/2018   ENDOMETRIAL ABLATION  2010   Had abnormal uterine bleeding (heavy and frequent), performed in [place], menses stopped completely 2 years after   EXPLORATORY LAPAROTOMY     Had exploratory surgery at the age of 56 for abdominal pain with cyst found on her left ovary, not treated surgically   FOOT SURGERY Right 2012   GENERATOR REMOVAL  10/02/2020   Procedure: GENERATOR REMOVAL;  Surgeon: Rexene Alberts, MD;  Location: [facility];  Service: Thoracic;;   HAND SURGERY Right    ICD IMPLANT N/A 10/01/2020   Procedure: ICD IMPLANT;  Surgeon: Constance Haw, MD;  Location: [facility];  Service: Cardiovascular;  Laterality: N/A;   ICD LEAD REMOVAL N/A 10/02/2020   Procedure: ICD LEAD REMOVAL;  Surgeon: Rexene Alberts, MD;  Location: [facility];  Service: Thoracic;  Laterality: N/A;   MEDIASTERNOTOMY N/A 10/02/2020   Procedure: MEDIAN STERNOTOMY REPAIR PERFORATED VENTRICLE EVACUATION LEFT HEMOTHORAX;  Surgeon: Rexene Alberts, MD;  Location: [facility];  Service: Thoracic;  Laterality: N/A;   RE-EXCISION OF BREAST LUMPECTOMY Right 05/15/2017   Procedure: RE-EXCISION OF BREAST LUMPECTOMY;  Surgeon: [name], MD;  Location: [facility];  Service: General;  Laterality: Right;   RIGHT/LEFT HEART CATH AND  CORONARY ANGIOGRAPHY N/A 06/21/2020   Procedure: RIGHT/LEFT HEART CATH AND CORONARY ANGIOGRAPHY;  Surgeon: Nelva Bush, MD;  Location: [facility];  Service: Cardiovascular;  Laterality: N/A;   ROBOTIC ASSISTED SALPINGO OOPHERECTOMY Bilateral 08/29/2019   Procedure: XI ROBOTIC ASSISTED SALPINGO OOPHORECTOMY;  Surgeon: [name], MD;  Location: [facility];  Service: Gynecology;  Laterality: Bilateral;   Septoplasty with turbinate reduction     TEE WITHOUT CARDIOVERSION N/A 10/02/2020   Procedure: TRANSESOPHAGEAL ECHOCARDIOGRAM (TEE);  Surgeon: Rexene Alberts, MD;  Location: [facility];  Service: Thoracic;  Laterality: N/A;   TONSILLECTOMY  1980   VEIN SURGERY     Removal Right Leg   WISDOM TOOTH EXTRACTION     Allergies  Allergen Reactions   Losartan Potassium Shortness Of Breath and Other (See Comments)   Ace Inhibitors Cough   Aspirin Other (See Comments)    Upset stomach   Cefaclor Hives and Other (See Comments)   Oxycodone Hives   Sulfa Antibiotics Hives and Other (See Comments)   Lisinopril Cough and Other (See Comments)   Prior to Admission medications   Medication Sig Start Date End Date Taking? Authorizing Provider  acyclovir (ZOVIRAX) 400 MG tablet Take 1 tablet (  400 mg total) by mouth 3 (three) times daily as needed. Patient taking differently: Take 400 mg by mouth 3 (three) times daily as needed (out breaks). 08/02/21   Copland, Gay Filler, MD  Adalimumab 80 MG/0.8ML PNKT Inject 80 mg into the skin every 14 (fourteen) days. 12/01/19   [provider]  albuterol (VENTOLIN HFA) 108 (90 Base) MCG/ACT inhaler Inhale 2 puffs into the lungs every 6 (six) hours as needed for shortness of breath. 09/22/20   [provider]  ascorbic acid (VITAMIN C) 1000 MG tablet Take 1,000 mg by mouth daily.    [provider]  aspirin EC 81 MG tablet Take 81 mg by mouth daily. Swallow whole.    [provider]  Azelastine HCl 0.15 %  SOLN Place 2 sprays into both nostrils 2 (two) times daily. For 30 days 07/16/20   [provider]  Calcium Carb-Cholecalciferol (CALCIUM+D3) 600-800 MG-UNIT TABS Take 1 tablet by mouth daily.    [provider]  carvedilol (COREG) 12.5 MG tablet Take 1 tablet (12.5 mg total) by mouth 2 (two) times daily. 04/19/21   Larey Dresser, MD  cetirizine (ZYRTEC) 10 MG tablet Take 10 mg by mouth daily.    [provider]  Cholecalciferol (VITAMIN D3) 1.25 MG (50000 UT) CAPS Take 1 weekly for 12 weeks 09/14/21   Copland, Gay Filler, MD  dapagliflozin propanediol (FARXIGA) 10 MG TABS tablet Take 1 tablet (10 mg total) by mouth daily before breakfast. 12/13/20   Larey Dresser, MD  diphenoxylate-atropine (LOMOTIL) 2.5-0.025 MG tablet Take 2 tablets by mouth 4 (four) times daily as needed for diarrhea or loose stools.    [provider]  doxycycline (VIBRAMYCIN) 100 MG capsule Take 1 capsule (100 mg total) by mouth 2 (two) times daily. 11/16/21   Copland, Gay Filler, MD  EPINEPHrine 0.3 mg/0.3 mL IJ SOAJ injection Inject 0.3 mg into the muscle as needed for anaphylaxis.    [provider]  exemestane (AROMASIN) 25 MG tablet TAKE 1 TABLET (25 MG TOTAL) BY MOUTH DAILY AFTER BREAKFAST. Patient taking differently: Take 25 mg by mouth daily after breakfast. TAKE 1 TABLET (25 MG TOTAL) BY MOUTH DAILY AFTER BREAKFAST. 07/20/21   Alla Feeling, NP  HYDROcodone-acetaminophen (NORCO) 10-325 MG tablet Take 0.5-1 tablets by mouth every 8 (eight) hours as needed for moderate pain or severe pain. 11/01/21   Copland, Gay Filler, MD  Hyoscyamine Sulfate 0.375 MG TBCR Take 0.375 mg by mouth in the morning and at bedtime.    [provider]  ipratropium (ATROVENT) 0.06 % nasal spray Place 2 sprays into both nostrils 4 (four) times daily. Use as needed 06/29/21   Copland, Gay Filler, MD  lansoprazole (PREVACID SOLUTAB) 15 MG disintegrating tablet Take 15 mg by mouth 2 (two) times daily  before a meal.    [provider]  levothyroxine (SYNTHROID) 75 MCG tablet Take 1 tablet (75 mcg total) by mouth daily before breakfast. 04/27/21   Copland, Gay Filler, MD  MAGNESIUM PO Take 250 mg by mouth daily.    [provider]  meloxicam (MOBIC) 15 MG tablet Take 15 mg by mouth daily as needed for pain (Hip pain).    [provider]  Menthol, Topical Analgesic, (BIOFREEZE COLORLESS) 4 % GEL Apply 1 application topically daily as needed (Hip pain).    [provider]  mesalamine (LIALDA) 1.2 g EC tablet Take 2.4 g by mouth in the morning and at bedtime.  [provider]  metFORMIN (GLUCOPHAGE) 500 MG tablet TAKE 1 TABLET BY MOUTH 2 TIMES DAILY WITH A MEAL. 10/24/21   Copland, Gay Filler, MD  montelukast (SINGULAIR) 10 MG tablet Take 10 mg by mouth at bedtime.     [provider]  Omega 3 1000 MG CAPS Take 2,000 mg by mouth every other day.    [provider]  promethazine (PHENERGAN) 25 MG tablet TAKE 1/2-1 TABLET (12.5-25 MG TOTAL) BY MOUTH DAILY AS NEEDED FOR NAUSEA OR VOMITING. Patient taking differently: Take 12.5-25 mg by mouth as needed for nausea. 06/13/21   Copland, Gay Filler, MD  rosuvastatin (CRESTOR) 20 MG tablet TAKE 1 TABLET BY MOUTH EVERY DAY Patient taking differently: Take 20 mg by mouth daily. 08/25/21   Copland, Gay Filler, MD  sacubitril-valsartan (ENTRESTO) 97-103 MG Take 1 tablet by mouth 2 (two) times daily. 08/09/21   Park Liter, MD  spironolactone (ALDACTONE) 25 MG tablet Take 1 tablet (25 mg total) by mouth daily. 11/01/21   Larey Dresser, MD  SYMBICORT 160-4.5 MCG/ACT inhaler INHALE 2 PUFFS INTO THE LUNGS TWICE A DAY Patient taking differently: Inhale 2 puffs into the lungs in the morning and at bedtime. 08/29/21   Saguier, Percell Miller, PA-C  triamcinolone (NASACORT) 55 MCG/ACT AERO nasal inhaler Place 2 sprays into the nose daily.    [provider]  venlafaxine XR (EFFEXOR-XR) 150 MG 24 hr  capsule TAKE 1 CAPSULE BY MOUTH DAILY WITH BREAKFAST. 10/24/21   Copland, Gay Filler, MD  zinc gluconate 50 MG tablet Take 50 mg by mouth daily.    [provider]  zolpidem (AMBIEN) 5 MG tablet Take 1 tablet (5 mg total) by mouth at bedtime as needed for sleep. TAKE 1 TABLET BY MOUTH EVERY DAY AT BEDTIME AS NEEDED FOR SLEEP 08/08/21   Copland, Gay Filler, MD   Social History   Socioeconomic History   Marital status: Widowed    Spouse name: Not on file   Number of children: Not on file   Years of education: Not on file   Highest education level: Not on file  Occupational History   Not on file  Tobacco Use   Smoking status: Former    Packs/day: 3.00    Years: 15.00    Total pack years: 45.00    Types: Cigarettes    Quit date: 09/14/1993    Years since quitting: 28.2   Smokeless tobacco: Never   Tobacco comments:    Quit 26 yrs ago  Vaping Use   Vaping Use: Never used  Substance and Sexual Activity   Alcohol use: Yes    Alcohol/week: 1.0 - 2.0 standard drink of alcohol    Types: 1 - 2 Glasses of wine per week    Comment: Drinks a couple every 2-3 months   Drug use: No   Sexual activity: Not Currently    Comment: ablation  Other Topics Concern   Not on file  Social History Narrative   Not on file   Social Determinants of Health   Financial Resource Strain: Not on file  Food Insecurity: Not on file  Transportation Needs: Not on file  Physical Activity: Not on file  Stress: Not on file  Social Connections: Not on file   Family History  Problem Relation Age of Onset   Arthritis Mother 50       Deceased   Breast cancer Mother    Lung cancer Mother    Hyperlipidemia Mother  Heart disease Mother    Hypertension Mother    Diabetes Mother    Crohn's disease Mother    Diabetes Maternal Grandfather    Heart disease Maternal Grandfather    Heart attack Maternal Grandfather    Leukemia Maternal Grandfather    Colitis Sister    Leukemia Cousin 3       mat  cousin   Lung cancer Father    Brain cancer Father     ROS: Currently denies lightheadedness, dizziness, Fever, chills, CP, SOB.   No personal history of DVT, PE, MI, or CVA. No loose teeth or dentures All other systems have been reviewed and were otherwise currently negative with the exception of those mentioned in the HPI and as above.  Objective: Vitals: Ht: 5'3" Wt: 162.6 lbs Temp: 97.3 BP: 118/78 Pulse: 81 O2 99% on room air.   Physical Exam: General: Alert, NAD. Trendelenberg Gait  HEENT: EOMI, Good Neck Extension  Pulm: No increased work of breathing.  Clear B/L A/P w/o crackle or wheeze.  CV: RRR, No m/g/r appreciated  GI: soft, NT, ND. BS x 4 quadrants Neuro: CN II-XII grossly intact without focal deficit.  Sensation intact distally Skin: No lesions in the area of chief complaint MSK/Surgical Site: non- TTP. ROM decreased d/t pain. + [place]. + SLR. + FABER/FADIR. Decreased strength.  NVI.    Imaging Review Plain radiographs demonstrate severe degenerative joint disease of the right hip.   The bone quality appears to be poor for age and reported activity level.  Preoperative templating of the joint replacement has been completed, documented, and submitted to the Operating Room personnel in order to optimize intra-operative equipment management.  Assessment: OA RIGHT HIP Active Problems:   * No active hospital problems. *   Plan: Plan for Procedure(s): TOTAL HIP ARTHROPLASTY ANTERIOR APPROACH  The patient history, physical exam, clinical judgement of the provider and imaging are consistent with end stage degenerative joint disease and total joint arthroplasty is deemed medically necessary. The treatment options including medical management, injection therapy, and arthroplasty were discussed at length. The risks and benefits of Procedure(s): TOTAL HIP ARTHROPLASTY ANTERIOR APPROACH were presented and reviewed.  The risks of nonoperative treatment, versus surgical  intervention including but not limited to continued pain, aseptic loosening, stiffness, dislocation/subluxation, infection, bleeding, nerve injury, blood clots, cardiopulmonary complications, morbidity, mortality, among others were discussed. The patient verbalizes understanding and wishes to proceed with the plan.  Patient is being admitted for surgery, pain control, PT, prophylactic antibiotics, VTE prophylaxis, progressive ambulation, ADL's and discharge planning. She will spend at least 1 night in the hospital due to the need for close cardiac monitoring.  Dental prophylaxis discussed and recommended for 2 years postoperatively.  The patient does meet the criteria for TXA which will be used perioperatively.   ASA 81 mg BID will be used postoperatively for DVT prophylaxis in addition to SCDs, and early ambulation. Plan for Oxycodone and Tylenol for pain.   No NSAIDs due to cardiac history.  Robaxin for muscle spasm.  Zofran for nausea and vomiting. Colace for constipation prevention. Pharmacy- [facility] in [place] The patient is planning to be discharged home with HHPT ([place]) and into the care of her son Remell Giaimo who can be reached at [phone] Follow up appt 01/04/22 at 4:15pm     Erin Fields Office [phone] 11/25/2021 12:32 PM

## 2021-11-30 ENCOUNTER — Other Ambulatory Visit: Payer: Self-pay | Admitting: Family Medicine

## 2021-11-30 DIAGNOSIS — E559 Vitamin D deficiency, unspecified: Secondary | ICD-10-CM

## 2021-12-06 ENCOUNTER — Other Ambulatory Visit: Payer: Self-pay | Admitting: Family Medicine

## 2021-12-06 DIAGNOSIS — F5104 Psychophysiologic insomnia: Secondary | ICD-10-CM

## 2021-12-06 NOTE — Pre-Procedure Instructions (Signed)
Surgical Instructions    Your procedure is scheduled on Tuesday, September 5.  Report to [facility] Main Entrance "A" at 5:30 A.M., then check in with the Admitting office.  Call this number if you have problems the morning of surgery:  [phone]   If you have any questions prior to your surgery date call [phone]: Open Monday-Friday 8am-4pm    Remember:  Do not eat after midnight the night before your surgery  You may drink clear liquids until 4:30 the morning of your surgery.   Clear liquids allowed are: Water, Non-Citrus Juices (without pulp), Carbonated Beverages, Clear Tea, Black Coffee ONLY (NO MILK, CREAM OR POWDERED CREAMER of any kind), and Gatorade   Patient Instructions   The day of surgery (if you have diabetes):  Drink ONE small 12 oz bottle of Gatorade G2 by 4:30 a.m. the morning of surgery This bottle was given to you during your hospital  pre-op appointment visit.  Nothing else to drink after completing the  Small 12 oz bottle of Gatorade G2.         If you have questions, please contact your surgeon's office.     Take these medicines the morning of surgery with A SIP OF WATER:   carvedilol (COREG) cetirizine (ZYRTEC) exemestane (AROMASIN) lansoprazole (PREVACID SOLUTAB) levothyroxine (SYNTHROID) mesalamine (LIALDA) rosuvastatin (CRESTOR) SYMBICORT triamcinolone (NASACORT) venlafaxine XR (EFFEXOR-XR)  If Needed: Albuterol inhaler-Please bring all inhalers with you the day of surgery.  acetaminophen (TYLENOL) acyclovir (ZOVIRAX) Nasal spray diphenoxylate-atropine (LOMOTIL) HYDROcodone-acetaminophen (NORCO) meloxicam (MOBIC) promethazine (PHENERGAN)  Follow your surgeon's instructions on when to stop Aspirin.  If no instructions were given by your surgeon then you will need to call the office to get those instructions.    As of today, STOP taking any Aleve, Naproxen, Ibuprofen, Motrin, Advil, Goody's, BC's, all herbal medications, fish  oil, and all vitamins. This includes: meloxicam (MOBIC).  WHAT DO I DO ABOUT MY DIABETES MEDICATION?   Do not take metFORMIN (GLUCOPHAGE) the morning of surgery.  Hold dapagliflozin propanediol (FARXIGA) for 72 hours prior to surgery. Last dose should be Saturday, September 2.   HOW TO MANAGE YOUR DIABETES BEFORE AND AFTER SURGERY  Why is it important to control my blood sugar before and after surgery? Improving blood sugar levels before and after surgery helps healing and can limit problems. A way of improving blood sugar control is eating a healthy diet by:  Eating less sugar and carbohydrates  Increasing activity/exercise  Talking with your doctor about reaching your blood sugar goals High blood sugars (greater than 180 mg/dL) can raise your risk of infections and slow your recovery, so you will need to focus on controlling your diabetes during the weeks before surgery. Make sure that the doctor who takes care of your diabetes knows about your planned surgery including the date and location.  How do I manage my blood sugar before surgery? Check your blood sugar at least 4 times a day, starting 2 days before surgery, to make sure that the level is not too high or low.  Check your blood sugar the morning of your surgery when you wake up and every 2 hours until you get to the Short Stay unit.  If your blood sugar is less than 70 mg/dL, you will need to treat for low blood sugar: Do not take insulin. Treat a low blood sugar (less than 70 mg/dL) with  cup of clear juice (cranberry or apple), 4 glucose tablets, OR glucose gel. Recheck blood sugar  in 15 minutes after treatment (to make sure it is greater than 70 mg/dL). If your blood sugar is not greater than 70 mg/dL on recheck, call [phone] for further instructions. Report your blood sugar to the short stay nurse when you get to Short Stay.  If you are admitted to the hospital after surgery: Your blood sugar will be checked by  the staff and you will probably be given insulin after surgery (instead of oral diabetes medicines) to make sure you have good blood sugar levels. The goal for blood sugar control after surgery is 80-180 mg/dL.           Do not wear jewelry or makeup. Do not wear lotions, powders, perfumes or deodorant. Do not shave 48 hours prior to surgery. Do not bring valuables to the hospital. Do not wear nail polish, gel polish, artificial nails, or any other type of covering on natural nails (fingers and toes) If you have artificial nails or gel coating that need to be removed by a nail salon, please have this removed prior to surgery. Artificial nails or gel coating may interfere with anesthesia's ability to adequately monitor your vital signs.  [facility] is not responsible for any belongings or valuables.    Do NOT Smoke (Tobacco/Vaping)  24 hours prior to your procedure  If you use a CPAP at night, you may bring your mask for your overnight stay.   Contacts, glasses, hearing aids, dentures or partials may not be worn into surgery, please bring cases for these belongings   For patients admitted to the hospital, discharge time will be determined by your treatment team.   Patients discharged the day of surgery will not be allowed to drive home, and someone needs to stay with them for 24 hours.   SURGICAL WAITING ROOM VISITATION Patients having surgery or a procedure may have no more than 2 support people in the waiting area - these visitors may rotate.   Children under the age of 68 must have an adult with them who is not the patient. If the patient needs to stay at the hospital during part of their recovery, the visitor guidelines for inpatient rooms apply. Pre-op nurse will coordinate an appropriate time for 1 support person to accompany patient in pre-op.  This support person may not rotate.   Please refer to the [facility] website for the visitor guidelines for Inpatients (after your surgery  is over and you are in a regular room).    Special instructions:    Oral Hygiene is also important to reduce your risk of infection.  Remember - BRUSH YOUR TEETH THE MORNING OF SURGERY WITH YOUR REGULAR TOOTHPASTE   [place]- Preparing For Surgery  Before surgery, you can play an important role. Because skin is not sterile, your skin needs to be as free of germs as possible. You can reduce the number of germs on your skin by washing with CHG (chlorahexidine gluconate) Soap before surgery.  CHG is an antiseptic cleaner which kills germs and bonds with the skin to continue killing germs even after washing.     Please do not use if you have an allergy to CHG or antibacterial soaps. If your skin becomes reddened/irritated stop using the CHG.  Do not shave (including legs and underarms) for at least 48 hours prior to first CHG shower. It is OK to shave your face.  Please follow these instructions carefully.     Shower the NIGHT BEFORE SURGERY and the MORNING OF  SURGERY with CHG Soap.   If you chose to wash your hair, wash your hair first as usual with your normal shampoo. After you shampoo, rinse your hair and body thoroughly to remove the shampoo.  Then [employer] and genitals (private parts) with your normal soap and rinse thoroughly to remove soap.  After that Use CHG Soap as you would any other liquid soap. You can apply CHG directly to the skin and wash gently with a scrungie or a clean washcloth.   Apply the CHG Soap to your body ONLY FROM THE NECK DOWN.  Do not use on open wounds or open sores. Avoid contact with your eyes, ears, mouth and genitals (private parts). Wash Face and genitals (private parts)  with your normal soap.   Wash thoroughly, paying special attention to the area where your surgery will be performed.  Thoroughly rinse your body with warm water from the neck down.  DO NOT shower/wash with your normal soap after using and rinsing off the CHG Soap.  Pat yourself  dry with a CLEAN TOWEL.  Wear CLEAN PAJAMAS to bed the night before surgery  Place CLEAN SHEETS on your bed the night before your surgery  DO NOT SLEEP WITH PETS.   Day of Surgery:  Take a shower with CHG soap. Wear Clean/Comfortable clothing the morning of surgery Do not apply any deodorants/lotions.   Remember to brush your teeth WITH YOUR REGULAR TOOTHPASTE.    If you received a COVID test during your pre-op visit, it is requested that you wear a mask when out in public, stay away from anyone that may not be feeling well, and notify your surgeon if you develop symptoms. If you have been in contact with anyone that has tested positive in the last 10 days, please notify your surgeon.    Please read over the following fact sheets that you were given.

## 2021-12-07 ENCOUNTER — Other Ambulatory Visit: Payer: Self-pay

## 2021-12-07 ENCOUNTER — Encounter (HOSPITAL_COMMUNITY): Payer: Self-pay

## 2021-12-07 ENCOUNTER — Encounter (HOSPITAL_COMMUNITY)
Admission: RE | Admit: 2021-12-07 | Discharge: 2021-12-07 | Disposition: A | Discharge status: Home / Self Care | Payer: No Typology Code available for payment source | Source: Ambulatory Visit | Attending: Orthopedic Surgery | Admitting: Orthopedic Surgery

## 2021-12-07 VITALS — BP 98/59 | HR 86 | Temp 98.2°F | Resp 17 | Ht 63.5 in | Wt 162.9 lb

## 2021-12-07 DIAGNOSIS — Z01818 Encounter for other preprocedural examination: Secondary | ICD-10-CM | POA: Insufficient documentation

## 2021-12-07 DIAGNOSIS — E119 Type 2 diabetes mellitus without complications: Secondary | ICD-10-CM | POA: Insufficient documentation

## 2021-12-07 HISTORY — DX: Unspecified osteoarthritis, unspecified site: M19.90

## 2021-12-07 LAB — GLUCOSE, CAPILLARY: Glucose-Capillary: 206 mg/dL — ABNORMAL HIGH (ref 70–99)

## 2021-12-07 LAB — BASIC METABOLIC PANEL
Anion gap: 10 (ref 5–15)
BUN: 8 mg/dL (ref 6–20)
CO2: 21 mmol/L — ABNORMAL LOW (ref 22–32)
Calcium: 9.3 mg/dL (ref 8.9–10.3)
Chloride: 100 mmol/L (ref 98–111)
Creatinine, Ser: 0.8 mg/dL (ref 0.44–1.00)
GFR, Estimated: >60 mL/min (ref >60)
Glucose, Bld: 163 mg/dL — ABNORMAL HIGH (ref 70–99)
Potassium: 4.1 mmol/L (ref 3.5–5.1)
Sodium: 131 mmol/L — ABNORMAL LOW (ref 135–145)

## 2021-12-07 LAB — CBC
HCT: 39.1 % (ref 36.0–46.0)
Hemoglobin: 13.1 g/dL (ref 12.0–15.0)
MCH: 30.1 pg (ref 26.0–34.0)
MCHC: 33.5 g/dL (ref 30.0–36.0)
MCV: 89.9 fL (ref 80.0–100.0)
Platelets: 427 10*3/uL — ABNORMAL HIGH (ref 150–400)
RBC: 4.35 MIL/uL (ref 3.87–5.11)
RDW: 11.9 % (ref 11.5–15.5)
WBC: 7.3 10*3/uL (ref 4.0–10.5)
nRBC: 0 % (ref 0.0–0.2)

## 2021-12-07 LAB — HEMOGLOBIN A1C
Hgb A1c MFr Bld: 5.8 % — ABNORMAL HIGH (ref 4.8–5.6)
Mean Plasma Glucose: 119.76 mg/dL

## 2021-12-07 LAB — SURGICAL PCR SCREEN
MRSA, PCR: NEGATIVE
Staphylococcus aureus: NEGATIVE

## 2021-12-07 LAB — TYPE AND SCREEN
ABO/RH(D): O POS
Antibody Screen: NEGATIVE

## 2021-12-07 NOTE — Progress Notes (Signed)
PCP - Dr. Silvestre Mesi Cardiologist -  Dr. Jenne Campus Dr. Loralie Champagne  PPM/ICD - n/a. Removed. Device Orders - n/a Rep Notified - n/a  Chest x-ray - 11/16/21 EKG - 09/06/21 Stress Test - 2012 ECHO - 06/08/21 Cardiac Cath - 06/21/20  Sleep Study - Negative for OSA   CBG today- 206 Patient states she has [employer] coffee and a lemon loaf this morning.  Checks blood sugar 3 times a week. Range 120 or less per patient.   Blood Thinner Instructions: Aspirin Instructions: Please follow your surgeon's instructions regarding Aspirin. If you have not received instructions then please contact your surgeon's office for instructions.   ERAS Protcol - Yes. Clear liquids until 0430 A.M. day of surgery.  PRE-SURGERY Ensure or G2- G2  COVID TEST- N/A   Anesthesia review: Yes. Clearance note from Dr. Agustin Cree 09/06/21.   Patient denies shortness of breath, fever, cough and chest pain at PAT appointment   All instructions explained to the patient, with a verbal understanding of the material. Patient agrees to go over the instructions while at home for a better understanding. The opportunity to ask questions was provided.

## 2021-12-08 NOTE — Progress Notes (Addendum)
Anesthesia Chart Review:  Case: [id number] Date/Time: 12/20/21 0715   Procedure: TOTAL HIP ARTHROPLASTY ANTERIOR APPROACH (Right: Hip)   Anesthesia type: Spinal   Pre-op diagnosis: OA RIGHT HIP   Location: [facility] OR ROOM 04 / [place] OR   Surgeons: [name], MD       DISCUSSION: Patient is a 56 year old female scheduled for the above procedure.  History includes former smoker (quit 09/14/93), post-operative N/V (scopolamine patch helps), CAD (mild to moderate CAD 06/2020), dilated non-ischemic cardiomyopathy, HFrEF, DM2, HLD, chronic venous insuffiencey, right breast cancer (diagnosed 03/16/17, s/p right lumpectomy/re-excision & radiation), iron deficiency anemia, IBS, ulcerative colitis, liver hemangioma, GERD, asthma, hypothyroidism, pulmonary nodule (stable 2 mm RUL x 3 years 01/16/18 CT).  She underwent St. Jude/Abbott ICD insertion on 10/01/21, but she was readmitted on 10/02/21 with hypovolemic shock with left hemothorax due to perforation of RV from transvenous defibrillator lead and underwent emergency median sternotomy, for removal of ICD, repair of perforated RV, repair of laceration of left 5th intercostal artery, and evacuation of left hemothorax. She did not undergo replacement of her ICD, and now last EF 40-45% on 06/08/21.  Last cardiology evaluation by Dr. Agustin Cree on 09/06/21: "Cardiovascular preop evaluation denies have any chest pain.  Hemodynamic compensated ejection fraction improved to 4045% I think she would be reasonable candidate proceed with surgery we do have to pay special attention for fluid status and meticulous hemodynamic monitoring."  She had previously seen Dr. Aundra Dubin at the [facility] on 06/08/21 and discussed plans for future THA. He wrote, "Patient will would likely be an acceptable candidate in the future for THR.  She has minimal dyspnea.  This should be done at [facility] with close availability of cardiology."   Anesthesia team to evaluate on the day of surgery.     VS: BP (!) 98/59   Pulse 86   Temp 36.8 C   Resp 17   Ht 5' 3.5" (1.613 m)   Wt 73.9 kg   SpO2 98%   BMI 28.40 kg/m    PROVIDERS: Copland, Gay Filler, MD is PCP  - Jenne Campus, MD is cardiologist - Loralie Champagne, MD is HF cardiologist. Last visit 06/08/21. Curt Bears, Will, MD is EP cardiologist. Last visit 02/21/21. As needed follow-up if she desires to move forward with another ICD implant. - [name], MD is HEM-ONC - [name], MD is RAD-ONC - [name], MD is general surgeon. Last visit 10/24/21. Seen for right breast lymphedema again following her open heart surgery. Manual compression not as effective and got a Flexitouch. Next mammogram scheduled for 01/2022. One year follow-up planned. Oren Beckmann, Lelon Frohlich, MD is GI. She also underwent polypectomy on 11/18/21 by Dewaine Oats, MD. She had a small cecal polyp, and large ascending colon polyp, and diverticulosis. Pathology showed cecal polyp with polypoid colonic mucous and ascending polyp was a tubular adenoma. Favored repeat in 1 year.   [name], MD is allergist - Achilles Dunk, MD is pulmonlogist   LABS: Labs reviewed: Acceptable for surgery. (all labs ordered are listed, but only abnormal results are displayed)  Labs Reviewed  GLUCOSE, CAPILLARY - Abnormal; Notable for the following components:      Result Value   Glucose-Capillary 206 (*)    All other components within normal limits  BASIC METABOLIC PANEL - Abnormal; Notable for the following components:   Sodium 131 (*)    CO2 21 (*)    Glucose, Bld 163 (*)    All other  components within normal limits  HEMOGLOBIN A1C - Abnormal; Notable for the following components:   Hgb A1c MFr Bld 5.8 (*)    All other components within normal limits  CBC - Abnormal; Notable for the following components:   Platelets 427 (*)    All other components within normal limits  SURGICAL PCR SCREEN  TYPE AND SCREEN     IMAGES: CXR 11/16/21: FINDINGS: The heart  size and mediastinal contours are within normal limits. Both lungs are clear. The visualized skeletal structures are unremarkable. Surgical clips right axilla. IMPRESSION: No active cardiopulmonary disease.   EKG: 09/06/21 ([facility]): Normal sinus rhythm T wave abnormality, consider lateral ischemia   CV: Echo 06/08/21: IMPRESSIONS   1. Left ventricular ejection fraction, by estimation, is 40 to 45%. The  left ventricle has mildly decreased function. The left ventricle  demonstrates global hypokinesis. Left ventricular diastolic parameters are  consistent with Grade I diastolic  dysfunction (impaired relaxation).   2. Right ventricular systolic function is mildly reduced. The right  ventricular size is normal. Tricuspid regurgitation signal is inadequate  for assessing PA pressure.   3. The mitral valve is normal in structure. No evidence of mitral valve  regurgitation. No evidence of mitral stenosis.   4. The aortic valve is tricuspid. Aortic valve regurgitation is not  visualized. No aortic stenosis is present.   5. The inferior vena cava is normal in size with greater than 50%  respiratory variability, suggesting right atrial pressure of 3 mmHg.    cMRI 01/12/21: IMPRESSION: 1.  Normal LV size with moderate global hypokinesis, EF 35%. 2. Normal RV size with borderline decreased systolic function, EF 94%. 3. There is a small area of LGE at the true apex. This is suggestive of a small apical infarction, possibly embolic. 4.  Normal extracellular volume percentage on T1 imaging. - Reviewed by Dr. Aundra Dubin, "EF 35%, stable compared to last echo.  There is a small area of delayed enhancement (scar) right at the apex.  I suspect that this represents a small embolic MI (small clot down the LAD affecting the distal LAD territory). No change to therapy." He did not think the small area of apical LGE explained the cardiomyopathy.   Cardiac cath 06/21/20: Conclusions:  Mild to  moderate, non-obstructive coronary artery disease. (40% 1st Diag; 20% Ramus; 25% Prox RCA) Normal left and right heart filling pressures. Mildly reduced Fick cardiac output/index.   Recommendations: Continue medical therapy and risk factor modification to prevent progression of disease. Escalate goal-directed medical therapy for nonischemic cardiomyopathy.     Past Medical History:  Diagnosis Date   Allergic rhinitis due to animal (cat) (dog) hair and dander 04/27/2020   Allergic rhinitis due to pollen 04/27/2020   Anxiety    Anxiety and depression 01/20/2018   Arthritis    Asthma    Asthma, persistent controlled 10/05/2018   B12 deficiency 11/27/2019   Borderline diabetes 12/17/2015   Breast cancer ([place])    Cancer ([place]) 2019   right breast cancer   CERVICAL CANCER, HX OF 12/06/2006   Qualifier: Diagnosis of  By: Arnoldo Morale MD, John E    Chronic sinusitis 01/26/2015   Chronic venous insufficiency 09/02/2015   Colon polyps    Congestive heart failure ([place]) 04/29/2020   Controlled type 2 diabetes mellitus without complication, without long-term current use of insulin ([place]) 05/28/2017   Coronary artery disease 25% of RCA, 20% intermediate branch, 40% diagonal branch based on cardiac cath from 2022 07/22/2020   Crohn  disease ([place]) 11/29/2016   Depression    Diabetes mellitus without complication ([place])    type 2   Dilated cardiomyopathy (HCC) ejection fraction 25% by echocardiogram from January 2022 04/29/2020   Dyspnea 09/15/2014   Elevated triglycerides with high cholesterol 11/26/2013   Environmental allergies    Eustachian tube dysfunction 12/04/2014   Ex-smoker 11/18/2015   Family history of breast cancer    Genetic testing 07/16/2017   Negative genetic testing on the multi-cancer panel.  The Multi-Gene Panel offered by [name] includes sequencing and/or deletion duplication testing of the following 83 genes: ALK, APC, ATM, AXIN2,BAP1,  BARD1, BLM, BMPR1A, BRCA1, BRCA2,  BRIP1, CASR, CDC73, CDH1, CDK4, CDKN1B, CDKN1C, CDKN2A (p14ARF), CDKN2A (p16INK4a), CEBPA, CHEK2, CTNNA1, DICER1, DIS3L2, EGFR (c.2369C>T, p.Thr790Met variant onl   GERD (gastroesophageal reflux disease)    HEADACHE 12/06/2006   Qualifier: Diagnosis of  By: Arnoldo Morale MD, John E    Hemothorax on left 10/02/2020   History of adenomatous polyp of colon 11/19/2017   Hyperlipidemia    Hypertension    Hypothyroidism    IDA (iron deficiency anemia) 11/06/2019   Impaired fasting glucose 12/12/2017   Insomnia 08/20/2013   Irritable bowel syndrome 11/29/2006   Qualifier: Diagnosis of  By: Arnoldo Morale MD, John E    Left sided abdominal pain 12/13/2017   Liver hemangioma 08/24/2014   Malignant neoplasm of upper-outer quadrant of right breast in female, estrogen receptor positive ([place]) 03/20/2017   Migraines    Mild persistent asthma, uncomplicated 93/81/8299   Nasal polyp, unspecified 01/10/2018   Oral herpes 04/20/2013   Other allergic rhinitis 09/15/2014   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 10/02/2020   Personal history of radiation therapy 2019   36 radiation tx   PONV (postoperative nausea and vomiting)    scopolamine patch helps   Recurrent infections 10/05/2018   Right upper lobe pulmonary nodule 01/10/2018   Symptomatic varicose veins, right 09/02/2015   Ulcerative colitis ([place])    Varicose veins     Past Surgical History:  Procedure Laterality Date   BREAST BIOPSY Right 02/2017   BREAST BIOPSY Right 11/07/2019   BREAST LUMPECTOMY Right 04/24/2017   BREAST LUMPECTOMY WITH RADIOACTIVE SEED AND SENTINEL LYMPH NODE BIOPSY Right 04/24/2017   Procedure: RIGHT BREAST LUMPECTOMY WITH RADIOACTIVE SEED AND SENTINEL LYMPH NODE BIOPSY;  Surgeon: [name], MD;  Location: [place];  Service: General;  Laterality: Right;   CORONARY ARTERY BYPASS GRAFT     crapal tunnel relase Left 05/2018   ENDOMETRIAL ABLATION  2010   Had abnormal uterine bleeding (heavy and  frequent), performed in [place], menses stopped completely 2 years after   EXPLORATORY LAPAROTOMY     Had exploratory surgery at the age of 43 for abdominal pain with cyst found on her left ovary, not treated surgically   FOOT SURGERY Right 2012   GENERATOR REMOVAL  10/02/2020   Procedure: GENERATOR REMOVAL;  Surgeon: Rexene Alberts, MD;  Location: [place];  Service: Thoracic;;   HAND SURGERY Right    ICD IMPLANT N/A 10/01/2020   Procedure: ICD IMPLANT;  Surgeon: Constance Haw, MD;  Location: [place] CV LAB;  Service: Cardiovascular;  Laterality: N/A;   ICD LEAD REMOVAL N/A 10/02/2020   Procedure: ICD LEAD REMOVAL;  Surgeon: Rexene Alberts, MD;  Location: [place];  Service: Thoracic;  Laterality: N/A;   MEDIASTERNOTOMY N/A 10/02/2020   Procedure: MEDIAN STERNOTOMY REPAIR PERFORATED VENTRICLE EVACUATION LEFT HEMOTHORAX;  Surgeon: Rexene Alberts, MD;  Location: [facility];  Service: Thoracic;  Laterality: N/A;   RE-EXCISION OF BREAST LUMPECTOMY Right 05/15/2017   Procedure: RE-EXCISION OF BREAST LUMPECTOMY;  Surgeon: [name], MD;  Location: [place];  Service: General;  Laterality: Right;   RIGHT/LEFT HEART CATH AND CORONARY ANGIOGRAPHY N/A 06/21/2020   Procedure: RIGHT/LEFT HEART CATH AND CORONARY ANGIOGRAPHY;  Surgeon: Nelva Bush, MD;  Location: [place] CV [facility];  Service: Cardiovascular;  Laterality: N/A;   ROBOTIC ASSISTED SALPINGO OOPHERECTOMY Bilateral 08/29/2019   Procedure: XI ROBOTIC ASSISTED SALPINGO OOPHORECTOMY;  Surgeon: [name], MD;  Location[facility];  Service: Gynecology;  Laterality: Bilateral;   Septoplasty with turbinate reduction     TEE WITHOUT CARDIOVERSION N/A 10/02/2020   Procedure: TRANSESOPHAGEAL ECHOCARDIOGRAM (TEE);  Surgeon: Rexene Alberts, MD;  Location: [place];  Service: Thoracic;  Laterality: N/A;   [place]     Removal Right Leg   WISDOM TOOTH EXTRACTION       MEDICATIONS:  acetaminophen (TYLENOL) 500 MG tablet   acyclovir (ZOVIRAX) 400 MG tablet   Adalimumab 80 MG/0.8ML PNKT   albuterol (VENTOLIN HFA) 108 (90 Base) MCG/ACT inhaler   ascorbic acid (VITAMIN C) 1000 MG tablet   aspirin EC 81 MG tablet   Azelastine HCl 0.15 % SOLN   carvedilol (COREG) 12.5 MG tablet   cetirizine (ZYRTEC) 10 MG tablet   Cholecalciferol (VITAMIN D3) 1.25 MG (50000 UT) CAPS   dapagliflozin propanediol (FARXIGA) 10 MG TABS tablet   diphenoxylate-atropine (LOMOTIL) 2.5-0.025 MG tablet   doxycycline (VIBRAMYCIN) 100 MG capsule   EPINEPHrine 0.3 mg/0.3 mL IJ SOAJ injection   exemestane (AROMASIN) 25 MG tablet   HYDROcodone-acetaminophen (NORCO) 10-325 MG tablet   Hyoscyamine Sulfate 0.375 MG TBCR   ipratropium (ATROVENT) 0.06 % nasal spray   lansoprazole (PREVACID SOLUTAB) 15 MG disintegrating tablet   levothyroxine (SYNTHROID) 75 MCG tablet   MAGNESIUM PO   meloxicam (MOBIC) 15 MG tablet   Menthol, Topical Analgesic, (BIOFREEZE COLORLESS) 4 % GEL   mesalamine (LIALDA) 1.2 g EC tablet   metFORMIN (GLUCOPHAGE) 500 MG tablet   montelukast (SINGULAIR) 10 MG tablet   Omega 3 1000 MG CAPS   promethazine (PHENERGAN) 25 MG tablet   rosuvastatin (CRESTOR) 20 MG tablet   sacubitril-valsartan (ENTRESTO) 97-103 MG   spironolactone (ALDACTONE) 25 MG tablet   SYMBICORT 160-4.5 MCG/ACT inhaler   triamcinolone (NASACORT) 55 MCG/ACT AERO nasal inhaler   venlafaxine XR (EFFEXOR-XR) 150 MG 24 hr capsule   zinc gluconate 50 MG tablet   zolpidem (AMBIEN) 5 MG tablet   No current facility-administered medications for this encounter.    Myra Gianotti, PA-C Surgical Short Stay/Anesthesiology [facility] Phone [phone] [facility] Phone [phone] 12/08/2021 5:13 PM

## 2021-12-08 NOTE — Anesthesia Preprocedure Evaluation (Addendum)
Anesthesia Evaluation  Patient identified by MRN, date of birth, ID band Patient awake    Reviewed: Allergy & Precautions, NPO status , Patient's Chart, lab work & pertinent test results  History of Anesthesia Complications (+) PONV and history of anesthetic complications  Airway Mallampati: III  TM Distance: >3 FB Neck ROM: Full    Dental  (+) Teeth Intact, Dental Advisory Given   Pulmonary asthma (well controlled, uses rescue inhaler about once a week) , former smoker,    Pulmonary exam normal breath sounds clear to auscultation       Cardiovascular hypertension (107/59 in preop ), Pt. on medications +CHF (LVEF 40-45%, mildly reduced RV fx)  Normal cardiovascular exam Rhythm:Regular Rate:Normal  Echo was done in 1/22 as part of pre-op workup, this showed low EF 25%.  She then had RHC/LHC in 3/22 with mild nonobstructive CAD and low cardiac index 2.1.  Echo in 4/22 showed EF still 20-25%.  She was referred for ICD.  St Jude ICD was placed in 2/26 but complicated by RV perforation with hemothorax and hemorrhagic shock.  ICD was removed and RV was surgically repaired with sternotomy.   Echo 05/2021 1. Left ventricular ejection fraction, by estimation, is 40 to 45%. The  left ventricle has mildly decreased function. The left ventricle  demonstrates global hypokinesis. Left ventricular diastolic parameters are  consistent with Grade I diastolic  dysfunction (impaired relaxation).  2. Right ventricular systolic function is mildly reduced. The right  ventricular size is normal. Tricuspid regurgitation signal is inadequate  for assessing PA pressure.  3. The mitral valve is normal in structure. No evidence of mitral valve  regurgitation. No evidence of mitral stenosis.  4. The aortic valve is tricuspid. Aortic valve regurgitation is not  visualized. No aortic stenosis is present.  5. The inferior vena cava is normal in size with  greater than 50%  respiratory variability, suggesting right atrial pressure of 3 mmHg.    Neuro/Psych  Headaches, PSYCHIATRIC DISORDERS Anxiety Depression    GI/Hepatic Neg liver ROS, PUD, GERD  Controlled and Medicated,  Endo/Other  diabetes, Well Controlled, Type 2, Oral Hypoglycemic AgentsHypothyroidism FS 124 in preop   Renal/GU negative Renal ROS  negative genitourinary   Musculoskeletal  (+) Arthritis , Osteoarthritis,  OA R hip   Abdominal   Peds  Hematology negative hematology ROS (+) Hb 13.1, plt 427   Anesthesia Other Findings R arm restricted   Reproductive/Obstetrics negative OB ROS                           Anesthesia Physical Anesthesia Plan  ASA: 3  Anesthesia Plan: MAC and Spinal   Post-op Pain Management: Tylenol PO (pre-op)*   Induction:   PONV Risk Score and Plan: 2 and Propofol infusion, TIVA and Scopolamine patch - Pre-op  Airway Management Planned: Natural Airway and Simple Face Mask  Additional Equipment: None  Intra-op Plan:   Post-operative Plan:   Informed Consent: I have reviewed the patients History and Physical, chart, labs and discussed the procedure including the risks, benefits and alternatives for the proposed anesthesia with the patient or authorized representative who has indicated his/her understanding and acceptance.       Plan Discussed with: CRNA  Anesthesia Plan Comments: ( )      Anesthesia Quick Evaluation

## 2021-12-12 ENCOUNTER — Ambulatory Visit
Admission: EM | Admit: 2021-12-12 | Discharge: 2021-12-12 | Disposition: A | Discharge status: Home / Self Care | Payer: No Typology Code available for payment source | Attending: Urgent Care | Admitting: Urgent Care

## 2021-12-12 DIAGNOSIS — E1169 Type 2 diabetes mellitus with other specified complication: Secondary | ICD-10-CM | POA: Diagnosis not present

## 2021-12-12 DIAGNOSIS — N3001 Acute cystitis with hematuria: Secondary | ICD-10-CM | POA: Insufficient documentation

## 2021-12-12 DIAGNOSIS — E119 Type 2 diabetes mellitus without complications: Secondary | ICD-10-CM | POA: Insufficient documentation

## 2021-12-12 LAB — POCT URINALYSIS DIP (MANUAL ENTRY)
Glucose, UA: >=1000 mg/dL — AB
Nitrite, UA: POSITIVE — AB
Protein Ur, POC: >=300 mg/dL — AB
Spec Grav, UA: 1.01 (ref 1.010–1.025)
Urobilinogen, UA: 1 E.U./dL
pH, UA: 5.5 (ref 5.0–8.0)

## 2021-12-12 MED ORDER — CIPROFLOXACIN HCL 500 MG PO TABS: 500.0000 mg | ORAL_TABLET | Freq: Two times a day (BID) | ORAL | 0 refills | Status: DC

## 2021-12-12 MED ORDER — PHENAZOPYRIDINE HCL 200 MG PO TABS: 200.0000 mg | ORAL_TABLET | Freq: Three times a day (TID) | ORAL | 0 refills | Status: DC | PRN

## 2021-12-12 NOTE — Discharge Instructions (Addendum)
Please start ciprofloxacin to address an urinary tract infection. Make sure you hydrate very well with plain water and a quantity of 80 ounces of water a day.  Please limit drinks that are considered urinary irritants such as soda, sweet tea, coffee, energy drinks, alcohol.  These can worsen your urinary and genital symptoms but also be the source of them.  I will let you know about your urine culture results through MyChart to see if we need to prescribe or change your antibiotics based off of those results.

## 2021-12-12 NOTE — ED Provider Notes (Signed)
[place]   MRN: [id number] DOB: 07-18-1965   Subjective:   Erin Fields is a 56 y.o. female presenting for 1 day history of acute onset dysuria, hematuria, urinary frequency and urgency.  Has type 2 diabetes, no treatment with insulin.  No history of CKD.  Has not been hydrating very well with water.  No current facility-administered medications for this encounter.  Current Outpatient Medications:    acetaminophen (TYLENOL) 500 MG tablet, Take 1,000 mg by mouth every 8 (eight) hours as needed for moderate pain., Disp: , Rfl:    acyclovir (ZOVIRAX) 400 MG tablet, Take 1 tablet (400 mg total) by mouth 3 (three) times daily as needed. (Patient taking differently: Take 400 mg by mouth 3 (three) times daily as needed (out breaks).), Disp: 270 tablet, Rfl: 1   Adalimumab 80 MG/0.8ML PNKT, Inject 80 mg into the skin every 14 (fourteen) days., Disp: , Rfl:    albuterol (VENTOLIN HFA) 108 (90 Base) MCG/ACT inhaler, Inhale 2 puffs into the lungs every 6 (six) hours as needed for shortness of breath., Disp: , Rfl:    ascorbic acid (VITAMIN C) 1000 MG tablet, Take 1,000 mg by mouth daily., Disp: , Rfl:    aspirin EC 81 MG tablet, Take 81 mg by mouth daily. Swallow whole., Disp: , Rfl:    Azelastine HCl 0.15 % SOLN, Place 2 sprays into both nostrils 2 (two) times daily., Disp: , Rfl:    carvedilol (COREG) 12.5 MG tablet, Take 1 tablet (12.5 mg total) by mouth 2 (two) times daily., Disp: 180 tablet, Rfl: 3   cetirizine (ZYRTEC) 10 MG tablet, Take 10 mg by mouth daily., Disp: , Rfl:    Cholecalciferol (VITAMIN D3) 1.25 MG (50000 UT) CAPS, TAKE 1 WEEKLY FOR 12 WEEKS (Patient taking differently: Take 50,000 Units by mouth every Wednesday. Take 1 weekly for 12 weeks), Disp: 12 capsule, Rfl: 0   dapagliflozin propanediol (FARXIGA) 10 MG TABS tablet, Take 1 tablet (10 mg total) by mouth daily before breakfast., Disp: 30 tablet, Rfl: 11   diphenoxylate-atropine (LOMOTIL) 2.5-0.025  MG tablet, Take 2 tablets by mouth 4 (four) times daily as needed for diarrhea or loose stools., Disp: , Rfl:    doxycycline (VIBRAMYCIN) 100 MG capsule, Take 1 capsule (100 mg total) by mouth 2 (two) times daily. (Patient not taking: Reported on 12/01/2021), Disp: 20 capsule, Rfl: 0   EPINEPHrine 0.3 mg/0.3 mL IJ SOAJ injection, Inject 0.3 mg into the muscle as needed for anaphylaxis., Disp: , Rfl:    exemestane (AROMASIN) 25 MG tablet, TAKE 1 TABLET (25 MG TOTAL) BY MOUTH DAILY AFTER BREAKFAST., Disp: 90 tablet, Rfl: 3   HYDROcodone-acetaminophen (NORCO) 10-325 MG tablet, Take 0.5-1 tablets by mouth every 8 (eight) hours as needed for moderate pain or severe pain., Disp: 30 tablet, Rfl: 0   Hyoscyamine Sulfate 0.375 MG TBCR, Take 0.375 mg by mouth 2 (two) times daily as needed (stomach cramping)., Disp: , Rfl:    ipratropium (ATROVENT) 0.06 % nasal spray, Place 2 sprays into both nostrils 4 (four) times daily. Use as needed (Patient taking differently: Place 2 sprays into both nostrils 4 (four) times daily as needed for rhinitis.), Disp: 15 mL, Rfl: 12   lansoprazole (PREVACID SOLUTAB) 15 MG disintegrating tablet, Take 15 mg by mouth 2 (two) times daily before a meal., Disp: , Rfl:    levothyroxine (SYNTHROID) 75 MCG tablet, Take 1 tablet (75 mcg total) by mouth daily before breakfast., Disp: 90 tablet,  Rfl: 2   MAGNESIUM PO, Take 250 mg by mouth daily., Disp: , Rfl:    meloxicam (MOBIC) 15 MG tablet, Take 15 mg by mouth daily as needed for pain (Hip pain)., Disp: , Rfl:    Menthol, Topical Analgesic, (BIOFREEZE COLORLESS) 4 % GEL, Apply 1 application topically daily as needed (Hip pain)., Disp: , Rfl:    mesalamine (LIALDA) 1.2 g EC tablet, Take 2.4 g by mouth in the morning and at bedtime. , Disp: , Rfl:    metFORMIN (GLUCOPHAGE) 500 MG tablet, TAKE 1 TABLET BY MOUTH 2 TIMES DAILY WITH A MEAL., Disp: 180 tablet, Rfl: 3   montelukast (SINGULAIR) 10 MG tablet, Take 10 mg by mouth at bedtime. , Disp: ,  Rfl:    Omega 3 1000 MG CAPS, Take 2,000 mg by mouth every other day., Disp: , Rfl:    promethazine (PHENERGAN) 25 MG tablet, TAKE 1/2-1 TABLET (12.5-25 MG TOTAL) BY MOUTH DAILY AS NEEDED FOR NAUSEA OR VOMITING. (Patient taking differently: Take 12.5-25 mg by mouth every 8 (eight) hours as needed for nausea or vomiting.), Disp: 30 tablet, Rfl: 5   rosuvastatin (CRESTOR) 20 MG tablet, TAKE 1 TABLET BY MOUTH EVERY DAY, Disp: 90 tablet, Rfl: 1   sacubitril-valsartan (ENTRESTO) 97-103 MG, Take 1 tablet by mouth 2 (two) times daily., Disp: 60 tablet, Rfl: 4   spironolactone (ALDACTONE) 25 MG tablet, Take 1 tablet (25 mg total) by mouth daily., Disp: 90 tablet, Rfl: 3   SYMBICORT 160-4.5 MCG/ACT inhaler, INHALE 2 PUFFS INTO THE LUNGS TWICE A DAY, Disp: 30.6 each, Rfl: 1   triamcinolone (NASACORT) 55 MCG/ACT AERO nasal inhaler, Place 2 sprays into the nose daily., Disp: , Rfl:    venlafaxine XR (EFFEXOR-XR) 150 MG 24 hr capsule, TAKE 1 CAPSULE BY MOUTH DAILY WITH BREAKFAST., Disp: 90 capsule, Rfl: 1   zinc gluconate 50 MG tablet, Take 50 mg by mouth daily., Disp: , Rfl:    zolpidem (AMBIEN) 5 MG tablet, TAKE 1 TABLET BY MOUTH EVERY DAY AT BEDTIME AS NEEDED FOR SLEEP, Disp: 30 tablet, Rfl: 3   Allergies  Allergen Reactions   Losartan Potassium Shortness Of Breath   Ace Inhibitors Cough   Aspirin     Uncoated Aspirin upsets stomach   Ceclor [Cefaclor] Hives   Oxycodone Hives   Sulfa Antibiotics Hives   Lisinopril Cough    Past Medical History:  Diagnosis Date   Allergic rhinitis due to animal (cat) (dog) hair and dander 04/27/2020   Allergic rhinitis due to pollen 04/27/2020   Anxiety    Anxiety and depression 01/20/2018   Arthritis    Asthma    Asthma, persistent controlled 10/05/2018   B12 deficiency 11/27/2019   Borderline diabetes 12/17/2015   Breast cancer ([place])    Cancer ([place]) 2019   right breast cancer   CERVICAL CANCER, HX OF 12/06/2006   Qualifier: Diagnosis of  By: Arnoldo Morale MD,  John E    Chronic sinusitis 01/26/2015   Chronic venous insufficiency 09/02/2015   Colon polyps    Congestive heart failure ([place]) 04/29/2020   Controlled type 2 diabetes mellitus without complication, without long-term current use of insulin ([place]) 05/28/2017   Coronary artery disease 25% of RCA, 20% intermediate branch, 40% diagonal branch based on cardiac cath from 2022 07/22/2020   Crohn disease ([place]) 11/29/2016   Depression    Diabetes mellitus without complication ([place])    type 2   Dilated cardiomyopathy (HCC) ejection fraction 25% by echocardiogram from January  2022 04/29/2020   Dyspnea 09/15/2014   Elevated triglycerides with high cholesterol 11/26/2013   Environmental allergies    Eustachian tube dysfunction 12/04/2014   Ex-smoker 11/18/2015   Family history of breast cancer    Genetic testing 07/16/2017   Negative genetic testing on the multi-cancer panel.  The Multi-Gene Panel offered by [name] includes sequencing and/or deletion duplication testing of the following 83 genes: ALK, APC, ATM, AXIN2,BAP1,  BARD1, BLM, BMPR1A, BRCA1, BRCA2, BRIP1, CASR, CDC73, CDH1, CDK4, CDKN1B, CDKN1C, CDKN2A (p14ARF), CDKN2A (p16INK4a), CEBPA, CHEK2, CTNNA1, DICER1, DIS3L2, EGFR (c.2369C>T, p.Thr790Met variant onl   GERD (gastroesophageal reflux disease)    HEADACHE 12/06/2006   Qualifier: Diagnosis of  By: Arnoldo Morale MD, John E    Hemothorax on left 10/02/2020   History of adenomatous polyp of colon 11/19/2017   Hyperlipidemia    Hypertension    Hypothyroidism    IDA (iron deficiency anemia) 11/06/2019   Impaired fasting glucose 12/12/2017   Insomnia 08/20/2013   Irritable bowel syndrome 11/29/2006   Qualifier: Diagnosis of  By: Arnoldo Morale MD, John E    Left sided abdominal pain 12/13/2017   Liver hemangioma 08/24/2014   Malignant neoplasm of upper-outer quadrant of right breast in female, estrogen receptor positive ([place]) 03/20/2017   Migraines    Mild persistent asthma, uncomplicated  86/57/8469   Nasal polyp, unspecified 01/10/2018   Oral herpes 04/20/2013   Other allergic rhinitis 09/15/2014   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 10/02/2020   Personal history of radiation therapy 2019   36 radiation tx   PONV (postoperative nausea and vomiting)    scopolamine patch helps   Recurrent infections 10/05/2018   Right upper lobe pulmonary nodule 01/10/2018   Symptomatic varicose veins, right 09/02/2015   Ulcerative colitis ([place])    Varicose veins      Past Surgical History:  Procedure Laterality Date   BREAST BIOPSY Right 02/2017   BREAST BIOPSY Right 11/07/2019   BREAST LUMPECTOMY Right 04/24/2017   BREAST LUMPECTOMY WITH RADIOACTIVE SEED AND SENTINEL LYMPH NODE BIOPSY Right 04/24/2017   Procedure: RIGHT BREAST LUMPECTOMY WITH RADIOACTIVE SEED AND SENTINEL LYMPH NODE BIOPSY;  Surgeon: [name], MD;  Location: [facility];  Service: General;  Laterality: Right;   CORONARY ARTERY BYPASS GRAFT     crapal tunnel relase Left 05/2018   ENDOMETRIAL ABLATION  2010   Had abnormal uterine bleeding (heavy and frequent), performed in [place], menses stopped completely 2 years after   EXPLORATORY LAPAROTOMY     Had exploratory surgery at the age of 58 for abdominal pain with cyst found on her left ovary, not treated surgically   FOOT SURGERY Right 2012   GENERATOR REMOVAL  10/02/2020   Procedure: GENERATOR REMOVAL;  Surgeon: Rexene Alberts, MD;  Location: [facility];  Service: Thoracic;;   HAND SURGERY Right    ICD IMPLANT N/A 10/01/2020   Procedure: ICD IMPLANT;  Surgeon: Constance Haw, MD;  Location: [facility];  Service: Cardiovascular;  Laterality: N/A;   ICD LEAD REMOVAL N/A 10/02/2020   Procedure: ICD LEAD REMOVAL;  Surgeon: Rexene Alberts, MD;  Location: [facility];  Service: Thoracic;  Laterality: N/A;   MEDIASTERNOTOMY N/A 10/02/2020   Procedure: MEDIAN STERNOTOMY REPAIR PERFORATED VENTRICLE EVACUATION LEFT HEMOTHORAX;   Surgeon: Rexene Alberts, MD;  Location: [facility];  Service: Thoracic;  Laterality: N/A;   RE-EXCISION OF BREAST LUMPECTOMY Right 05/15/2017   Procedure: RE-EXCISION OF BREAST LUMPECTOMY;  Surgeon: [name], MD;  Location: [facility];  Service: General;  Laterality: Right;   RIGHT/LEFT HEART CATH AND CORONARY ANGIOGRAPHY N/A 06/21/2020   Procedure: RIGHT/LEFT HEART CATH AND CORONARY ANGIOGRAPHY;  Surgeon: Nelva Bush, MD;  Location: [facility];  Service: Cardiovascular;  Laterality: N/A;   ROBOTIC ASSISTED SALPINGO OOPHERECTOMY Bilateral 08/29/2019   Procedure: XI ROBOTIC ASSISTED SALPINGO OOPHORECTOMY;  Surgeon: [name], MD;  Location: [facility];  Service: Gynecology;  Laterality: Bilateral;   Septoplasty with turbinate reduction     TEE WITHOUT CARDIOVERSION N/A 10/02/2020   Procedure: TRANSESOPHAGEAL ECHOCARDIOGRAM (TEE);  Surgeon: Rexene Alberts, MD;  Location[facility];  Service: Thoracic;  Laterality: N/A;   [place]     Removal Right Leg   WISDOM TOOTH EXTRACTION      Family History  Problem Relation Age of Onset   Arthritis Mother 61       Deceased   Breast cancer Mother    Lung cancer Mother    Hyperlipidemia Mother    Heart disease Mother    Hypertension Mother    Diabetes Mother    Crohn's disease Mother    Diabetes Maternal Grandfather    Heart disease Maternal Grandfather    Heart attack Maternal Grandfather    Leukemia Maternal Grandfather    Colitis Sister    Leukemia Cousin 3       mat cousin   Lung cancer Father    Brain cancer Father     Social History   Tobacco Use   Smoking status: Former    Packs/day: 3.00    Years: 15.00    Total pack years: 45.00    Types: Cigarettes    Quit date: 09/14/1993    Years since quitting: 28.2   Smokeless tobacco: Never   Tobacco comments:    Quit 26 yrs ago  Vaping Use   Vaping Use: Never used  Substance Use Topics   Alcohol use:  Yes    Alcohol/week: 1.0 - 2.0 standard drink of alcohol    Types: 1 - 2 Glasses of wine per week    Comment: Drinks a couple every 2-3 months   Drug use: No    ROS   Objective:   Vitals: BP 119/71   Pulse 95   Temp 98.3 F (36.8 C)   Resp 16   SpO2 98%   Physical Exam Constitutional:      General: She is not in acute distress.    Appearance: Normal appearance. She is well-developed. She is not ill-appearing, toxic-appearing or diaphoretic.  HENT:     Head: Normocephalic and atraumatic.     Nose: Nose normal.     Mouth/Throat:     Mouth: Mucous membranes are moist.  Eyes:     General: No scleral icterus.       Right eye: No discharge.        Left eye: No discharge.     Extraocular Movements: Extraocular movements intact.     Conjunctiva/sclera: Conjunctivae normal.  Cardiovascular:     Rate and Rhythm: Normal rate.  Pulmonary:     Effort: Pulmonary effort is normal.  Abdominal:     General: Bowel sounds are normal. There is no distension.     Palpations: Abdomen is soft. There is no mass.     Tenderness: There is no abdominal tenderness. There is no right CVA tenderness, left CVA tenderness, guarding or rebound.  Skin:    General:  Skin is warm and dry.  Neurological:     General: No focal deficit present.     Mental Status: She is alert and oriented to person, place, and time.  Psychiatric:        Mood and Affect: Mood normal.        Behavior: Behavior normal.        Thought Content: Thought content normal.        Judgment: Judgment normal.     Results for orders placed or performed during the hospital encounter of 12/12/21 (from the past 24 hour(s))  POCT urinalysis dipstick     Status: Abnormal   Collection Time: 12/12/21  5:56 PM  Result Value Ref Range   Color, UA red (A) yellow   Clarity, UA turbid (A) clear   Glucose, UA >=1,000 (A) negative mg/dL   Bilirubin, UA moderate (A) negative   Ketones, POC UA trace (5) (A) negative mg/dL   Spec Grav, UA  1.010 1.010 - 1.025   Blood, UA large (A) negative   pH, UA 5.5 5.0 - 8.0   Protein Ur, POC >=300 (A) negative mg/dL   Urobilinogen, UA 1.0 0.2 or 1.0 E.U./dL   Nitrite, UA Positive (A) Negative   Leukocytes, UA Large (3+) (A) Negative    Assessment and Plan :   PDMP not reviewed this encounter.  1. Acute cystitis with hematuria   2. Type 2 diabetes mellitus treated without insulin (HCC)    Start ciprofloxacin given her medication allergies to cover for acute cystitis, urine culture pending.  Recommended aggressive hydration, limiting urinary irritants. Counseled patient on potential for adverse effects with medications prescribed/recommended today, ER and return-to-clinic precautions discussed, patient verbalized understanding.Jaynee Eagles, PA-C 12/12/21 0712

## 2021-12-12 NOTE — ED Triage Notes (Signed)
Pt. C/o bladder spams, blood in urine and frequent urination with little outcome that started today.

## 2021-12-13 ENCOUNTER — Ambulatory Visit (HOSPITAL_COMMUNITY)
Admission: RE | Admit: 2021-12-13 | Discharge: 2021-12-13 | Disposition: A | Discharge status: Home / Self Care | Payer: No Typology Code available for payment source | Source: Ambulatory Visit | Attending: Cardiology | Admitting: Cardiology

## 2021-12-13 ENCOUNTER — Ambulatory Visit: Payer: No Typology Code available for payment source | Admitting: Family Medicine

## 2021-12-13 ENCOUNTER — Encounter (HOSPITAL_COMMUNITY): Payer: Self-pay | Admitting: Cardiology

## 2021-12-13 VITALS — BP 94/60 | HR 93 | Wt 160.8 lb

## 2021-12-13 DIAGNOSIS — Z923 Personal history of irradiation: Secondary | ICD-10-CM | POA: Insufficient documentation

## 2021-12-13 DIAGNOSIS — I2510 Atherosclerotic heart disease of native coronary artery without angina pectoris: Secondary | ICD-10-CM | POA: Insufficient documentation

## 2021-12-13 DIAGNOSIS — I5022 Chronic systolic (congestive) heart failure: Secondary | ICD-10-CM | POA: Insufficient documentation

## 2021-12-13 DIAGNOSIS — C50919 Malignant neoplasm of unspecified site of unspecified female breast: Secondary | ICD-10-CM | POA: Diagnosis not present

## 2021-12-13 DIAGNOSIS — Z95810 Presence of automatic (implantable) cardiac defibrillator: Secondary | ICD-10-CM | POA: Insufficient documentation

## 2021-12-13 DIAGNOSIS — Z8249 Family history of ischemic heart disease and other diseases of the circulatory system: Secondary | ICD-10-CM | POA: Insufficient documentation

## 2021-12-13 DIAGNOSIS — I428 Other cardiomyopathies: Secondary | ICD-10-CM | POA: Insufficient documentation

## 2021-12-13 DIAGNOSIS — Z7984 Long term (current) use of oral hypoglycemic drugs: Secondary | ICD-10-CM | POA: Insufficient documentation

## 2021-12-13 DIAGNOSIS — M169 Osteoarthritis of hip, unspecified: Secondary | ICD-10-CM | POA: Diagnosis not present

## 2021-12-13 DIAGNOSIS — Z79899 Other long term (current) drug therapy: Secondary | ICD-10-CM | POA: Insufficient documentation

## 2021-12-13 DIAGNOSIS — M25559 Pain in unspecified hip: Secondary | ICD-10-CM | POA: Insufficient documentation

## 2021-12-13 DIAGNOSIS — I110 Hypertensive heart disease with heart failure: Secondary | ICD-10-CM | POA: Insufficient documentation

## 2021-12-13 DIAGNOSIS — K5190 Ulcerative colitis, unspecified, without complications: Secondary | ICD-10-CM | POA: Insufficient documentation

## 2021-12-13 NOTE — Patient Instructions (Signed)
There has been no changes to your medications.  Your physician has requested that you have an echocardiogram. Echocardiography is a painless test that uses sound waves to create images of your heart. It provides your doctor with information about the size and shape of your heart and how well your heart's chambers and valves are working. This procedure takes approximately one hour. There are no restrictions for this procedure.  Your physician recommends that you schedule a follow-up appointment in: 6 months with echocardiogram ( February 2024) ** please call the office in November to arrange your follow up appointment **  If you have any questions or concerns before your next appointment please send [place] a message through [place] or call our office at [phone].    TO LEAVE A MESSAGE FOR THE NURSE SELECT OPTION 2, PLEASE LEAVE A MESSAGE INCLUDING: YOUR NAME DATE OF BIRTH CALL BACK NUMBER REASON FOR CALL**this is important as we prioritize the call backs  YOU WILL RECEIVE A CALL BACK THE SAME DAY AS LONG AS YOU CALL BEFORE 4:00 PM  At the [facility], you and your health needs are our priority. As part of our continuing mission to provide you with exceptional heart care, we have created designated Provider Care Teams. These Care Teams include your primary Cardiologist (physician) and Advanced Practice Providers (APPs- Physician Assistants and Nurse Practitioners) who all work together to provide you with the care you need, when you need it.   You may see any of the following providers on your designated Care Team at your next follow up: Dr Glori Bickers Dr Loralie Champagne Dr. Roxana Hires, NP Lyda Jester, [place] [facility] [place], [place] Forestine Na, NP Audry Riles, PharmD   Please be sure to bring in all your medications bottles to every appointment.

## 2021-12-13 NOTE — Progress Notes (Signed)
PCP: Dr. Lorelei Pont Cardiology: Dr. Agustin Cree  56 y.o. with history of ulcerative colitis, HTN, and breast cancer was referred by Dr. Agustin Cree for evaluation of CHF.   Patient's breast cancer was treated with surgery and radiation, no chemo.  Patient reports exertional dyspnea since 2019.  Patient has right hip OA, needs THR.  Echo was done in 1/22 as part of pre-op workup, this showed low EF 25%.  She then had RHC/LHC in 3/22 with mild nonobstructive CAD and low cardiac index 2.1.  Echo in 4/22 showed EF still 20-25%.  She was referred for ICD.  St Jude ICD was placed in 1/30 but complicated by RV perforation with hemothorax and hemorrhagic shock.  ICD was removed and RV was surgically repaired with sternotomy.  Patient's mother had CHF in her 86V, uncertain etiology.   Cardiac MRI in 9/22 showed EF 35% with moderate LV hypokinesis, RV EF 44%, small area of LGE at the true apex (?small embolic apical infarct).    Echo in 2/23 showed EF 40-45%, diffuse hypokinesis, mildly decreased RV systolic function.  Patient returns for followup of CHF.  Main complaint continues to be right hip pain.  Plan for right THR in 9/23.  No significant exertional dyspnea though she is not very active due to hip arthritis.  No orthopnea/PND.  No chest pain.  Weight down 1 lb.   Labs (7/22): K 4.2, creatinine 0.58 Labs (8/22): K 4.3, creatinine 0.52, BNP 30 Labs (10/22): K 4.2, creatinine 0.76 Labs (1/23): K 5.1, creatinine 0.71 Labs (8/23): K 4.1, creatinine 0.8  ECG (personally reviewed): NSR, anterolateral TWIs  PMH: 1.  Cervical cancer 2.  B12 deficiency 3.  Asthma 4.  Type 2 diabetes 5.  Ulcerative colitis 6.  HTN 7.  Hypothyroidism 8.  Irritable bowel syndrome 9.  Breast cancer: Right-sided, surgery and radiation but no chemotherapy.  10.  Chronic systolic CHF: Nonischemic cardiomyopathy.  - Echo (1/22): EF 25% - LHC/RHC (3/22): Mild nonobstructive CAD; mean RA 3, PA 24/11, CI 2.1.  - Echo (4/22): EF  20-25%, mild LV dilation, normal RV.  - Echo (6/22): EF 30-35%  - St Jude ICD placed 7/84, complicated by RV perforation requiring sternotomy due to hemothorax/hemorrhagic shock, removal of ICD and surgical repair of RV perforation.  - Cardiac MRI (9/22): EF 35% with moderate LV hypokinesis, RV EF 44%, small area of LGE at the true apex (?small embolic apical infarct).   - Echo (2/23): EF 40-45%, diffuse hypokinesis, mildly decreased RV systolic function. 11.  Right hip OA: Needs THR.  12.  COVID-19 12/21  FH: Grandfather with MI, mother with CHF in her 93s.    SH: Lives in [place], nonsmoker, rare EtoH.   ROS: All systems reviewed and negative except as per HPI.  Current Outpatient Medications  Medication Sig Dispense Refill   acetaminophen (TYLENOL) 500 MG tablet Take 1,000 mg by mouth every 8 (eight) hours as needed for moderate pain.     acyclovir (ZOVIRAX) 400 MG tablet Take 1 tablet (400 mg total) by mouth 3 (three) times daily as needed. 270 tablet 1   Adalimumab 80 MG/0.8ML PNKT Inject 80 mg into the skin every 14 (fourteen) days.     albuterol (VENTOLIN HFA) 108 (90 Base) MCG/ACT inhaler Inhale 2 puffs into the lungs every 6 (six) hours as needed for shortness of breath.     ascorbic acid (VITAMIN C) 1000 MG tablet Take 1,000 mg by mouth daily.     aspirin EC 81 MG  tablet Take 81 mg by mouth daily. Swallow whole.     Azelastine HCl 0.15 % SOLN Place 2 sprays into both nostrils 2 (two) times daily.     carvedilol (COREG) 12.5 MG tablet Take 1 tablet (12.5 mg total) by mouth 2 (two) times daily. 180 tablet 3   cetirizine (ZYRTEC) 10 MG tablet Take 10 mg by mouth daily.     Cholecalciferol (VITAMIN D-3) 125 MCG (5000 UT) TABS Take 1 tablet by mouth once a week.     ciprofloxacin (CIPRO) 500 MG tablet Take 1 tablet (500 mg total) by mouth 2 (two) times daily. 14 tablet 0   dapagliflozin propanediol (FARXIGA) 10 MG TABS tablet Take 1 tablet (10 mg total) by mouth daily before  breakfast. 30 tablet 11   diphenoxylate-atropine (LOMOTIL) 2.5-0.025 MG tablet Take 2 tablets by mouth 4 (four) times daily as needed for diarrhea or loose stools.     EPINEPHrine 0.3 mg/0.3 mL IJ SOAJ injection Inject 0.3 mg into the muscle as needed for anaphylaxis.     exemestane (AROMASIN) 25 MG tablet TAKE 1 TABLET (25 MG TOTAL) BY MOUTH DAILY AFTER BREAKFAST. 90 tablet 3   HYDROcodone-acetaminophen (NORCO) 10-325 MG tablet Take 0.5-1 tablets by mouth every 8 (eight) hours as needed for moderate pain or severe pain. 30 tablet 0   Hyoscyamine Sulfate 0.375 MG TBCR Take 0.375 mg by mouth 2 (two) times daily as needed (stomach cramping).     ipratropium (ATROVENT) 0.06 % nasal spray Place 2 sprays into both nostrils 4 (four) times daily. Use as needed 15 mL 12   lansoprazole (PREVACID SOLUTAB) 15 MG disintegrating tablet Take 15 mg by mouth 2 (two) times daily before a meal.     levothyroxine (SYNTHROID) 75 MCG tablet Take 1 tablet (75 mcg total) by mouth daily before breakfast. 90 tablet 2   MAGNESIUM PO Take 250 mg by mouth daily.     meloxicam (MOBIC) 15 MG tablet Take 15 mg by mouth daily as needed for pain (Hip pain).     Menthol, Topical Analgesic, (BIOFREEZE COLORLESS) 4 % GEL Apply 1 application topically daily as needed (Hip pain).     mesalamine (LIALDA) 1.2 g EC tablet Take 2.4 g by mouth in the morning and at bedtime.      metFORMIN (GLUCOPHAGE) 500 MG tablet TAKE 1 TABLET BY MOUTH 2 TIMES DAILY WITH A MEAL. 180 tablet 3   montelukast (SINGULAIR) 10 MG tablet Take 10 mg by mouth at bedtime.      Omega 3 1000 MG CAPS Take 2,000 mg by mouth every other day.     phenazopyridine (PYRIDIUM) 200 MG tablet Take 1 tablet (200 mg total) by mouth 3 (three) times daily as needed for pain. 10 tablet 0   promethazine (PHENERGAN) 25 MG tablet TAKE 1/2-1 TABLET (12.5-25 MG TOTAL) BY MOUTH DAILY AS NEEDED FOR NAUSEA OR VOMITING. 30 tablet 5   rosuvastatin (CRESTOR) 20 MG tablet TAKE 1 TABLET BY MOUTH  EVERY DAY 90 tablet 1   sacubitril-valsartan (ENTRESTO) 97-103 MG Take 1 tablet by mouth 2 (two) times daily. 60 tablet 4   spironolactone (ALDACTONE) 25 MG tablet Take 1 tablet (25 mg total) by mouth daily. 90 tablet 3   SYMBICORT 160-4.5 MCG/ACT inhaler INHALE 2 PUFFS INTO THE LUNGS TWICE A DAY 30.6 each 1   triamcinolone (NASACORT) 55 MCG/ACT AERO nasal inhaler Place 2 sprays into the nose daily.     venlafaxine XR (EFFEXOR-XR) 150 MG 24 hr capsule  TAKE 1 CAPSULE BY MOUTH DAILY WITH BREAKFAST. 90 capsule 1   zinc gluconate 50 MG tablet Take 50 mg by mouth daily.     zolpidem (AMBIEN) 5 MG tablet TAKE 1 TABLET BY MOUTH EVERY DAY AT BEDTIME AS NEEDED FOR SLEEP 30 tablet 3   No current facility-administered medications for this encounter.   General: NAD Neck: No JVD, no thyromegaly or thyroid nodule.  Lungs: Clear to auscultation bilaterally with normal respiratory effort. CV: Nondisplaced PMI.  Heart regular S1/S2, no S3/S4, no murmur.  No peripheral edema.  No carotid bruit.  Normal pedal pulses.  Abdomen: Soft, nontender, no hepatosplenomegaly, no distention.  Skin: Intact without lesions or rashes.  Neurologic: Alert and oriented x 3.  Psych: Normal affect. Extremities: No clubbing or cyanosis.  HEENT: Normal.   Assessment/Plan: 1. Chronic systolic CHF: Nonischemic cardiomyopathy.  EF noted to be low since 1/22.  LHC in 3/22 with mild nonobstructive coronary disease.  Last echo in 6/22 with EF 30-35%.  Cardic MRI in 9/22 showed EF 35% with moderate LV hypokinesis, RV EF 44%, small area of LGE at the true apex (?small embolic apical infarct).  Echo today showed EF up to 40-45%.  Cause uncertain, her mother had a cardiomyopathy of uncertain etiology so cannot rule out familial cardiomyopathy.  She had treatment for breast cancer but this did not include chemotherapy.  She has been on adalimumab for ulcerative colitis, but this does not commonly cause CHF.  No drugs and rare ETOH.  The small  area of apical LGE does not explain the cardiomyopathy.  NYHA class II symptoms, more limited by hip pain.  Not volume overloaded on exam.   - Continue Entresto 97/103 bid. Recent labs stable.  - Continue Coreg 12.5 mg bid, unable to tolerate higher dose.  - Continue dapagliflozin 10 mg daily.  - Continue spironolactone 25 mg daily.   - Patient has been unsure about placement again of ICD give complicated initial ICD placement.  Subcutaneous ICD would be an option. However, EF up to 40-45% on 2/23 echo, so out of ICD range.   - Repeat echo at followup in 6 months.  2. RV perforation with ICD placement: S/p sternotomy/repair in 6/22.  3. Hip OA: Patient is an acceptable candidate for right THR.  She has minimal dyspnea.  This should be done at [facility] with close availability of cardiology.    Followup in 6 months witih echo.    Erin Fields 12/13/2021

## 2021-12-15 LAB — URINE CULTURE: Culture: 40000 — AB

## 2021-12-20 ENCOUNTER — Other Ambulatory Visit: Payer: Self-pay

## 2021-12-20 ENCOUNTER — Encounter (HOSPITAL_COMMUNITY)
Admission: RE | Disposition: A | Discharge status: Home / Self Care | Payer: Self-pay | Source: Home / Self Care | Attending: Orthopedic Surgery

## 2021-12-20 ENCOUNTER — Inpatient Hospital Stay (HOSPITAL_COMMUNITY)
Admission: RE | Admit: 2021-12-20 | Discharge: 2021-12-21 | DRG: 470 | Disposition: A | Discharge status: Home / Self Care | Payer: No Typology Code available for payment source | Attending: Orthopedic Surgery | Admitting: Orthopedic Surgery

## 2021-12-20 ENCOUNTER — Encounter (HOSPITAL_COMMUNITY): Payer: Self-pay | Admitting: Orthopedic Surgery

## 2021-12-20 ENCOUNTER — Inpatient Hospital Stay (HOSPITAL_COMMUNITY): Payer: No Typology Code available for payment source | Admitting: Vascular Surgery

## 2021-12-20 ENCOUNTER — Inpatient Hospital Stay (HOSPITAL_COMMUNITY): Payer: No Typology Code available for payment source | Admitting: Certified Registered Nurse Anesthetist

## 2021-12-20 ENCOUNTER — Inpatient Hospital Stay (HOSPITAL_COMMUNITY): Payer: No Typology Code available for payment source

## 2021-12-20 DIAGNOSIS — M25551 Pain in right hip: Secondary | ICD-10-CM | POA: Diagnosis present

## 2021-12-20 DIAGNOSIS — J4530 Mild persistent asthma, uncomplicated: Secondary | ICD-10-CM | POA: Diagnosis present

## 2021-12-20 DIAGNOSIS — Z7982 Long term (current) use of aspirin: Secondary | ICD-10-CM

## 2021-12-20 DIAGNOSIS — Z886 Allergy status to analgesic agent status: Secondary | ICD-10-CM

## 2021-12-20 DIAGNOSIS — Z7902 Long term (current) use of antithrombotics/antiplatelets: Secondary | ICD-10-CM

## 2021-12-20 DIAGNOSIS — F32A Depression, unspecified: Secondary | ICD-10-CM | POA: Diagnosis present

## 2021-12-20 DIAGNOSIS — Z87891 Personal history of nicotine dependence: Secondary | ICD-10-CM

## 2021-12-20 DIAGNOSIS — H6990 Unspecified Eustachian tube disorder, unspecified ear: Secondary | ICD-10-CM | POA: Diagnosis present

## 2021-12-20 DIAGNOSIS — Z8249 Family history of ischemic heart disease and other diseases of the circulatory system: Secondary | ICD-10-CM

## 2021-12-20 DIAGNOSIS — I110 Hypertensive heart disease with heart failure: Secondary | ICD-10-CM | POA: Diagnosis present

## 2021-12-20 DIAGNOSIS — Z79899 Other long term (current) drug therapy: Secondary | ICD-10-CM

## 2021-12-20 DIAGNOSIS — Z881 Allergy status to other antibiotic agents status: Secondary | ICD-10-CM

## 2021-12-20 DIAGNOSIS — Z803 Family history of malignant neoplasm of breast: Secondary | ICD-10-CM

## 2021-12-20 DIAGNOSIS — Z95810 Presence of automatic (implantable) cardiac defibrillator: Secondary | ICD-10-CM

## 2021-12-20 DIAGNOSIS — I872 Venous insufficiency (chronic) (peripheral): Secondary | ICD-10-CM | POA: Diagnosis present

## 2021-12-20 DIAGNOSIS — Z885 Allergy status to narcotic agent status: Secondary | ICD-10-CM

## 2021-12-20 DIAGNOSIS — I509 Heart failure, unspecified: Secondary | ICD-10-CM

## 2021-12-20 DIAGNOSIS — Z888 Allergy status to other drugs, medicaments and biological substances status: Secondary | ICD-10-CM

## 2021-12-20 DIAGNOSIS — M1611 Unilateral primary osteoarthritis, right hip: Principal | ICD-10-CM | POA: Diagnosis present

## 2021-12-20 DIAGNOSIS — Z7951 Long term (current) use of inhaled steroids: Secondary | ICD-10-CM

## 2021-12-20 DIAGNOSIS — M1990 Unspecified osteoarthritis, unspecified site: Secondary | ICD-10-CM | POA: Diagnosis present

## 2021-12-20 DIAGNOSIS — E7800 Pure hypercholesterolemia, unspecified: Secondary | ICD-10-CM | POA: Diagnosis present

## 2021-12-20 DIAGNOSIS — Z79890 Hormone replacement therapy (postmenopausal): Secondary | ICD-10-CM

## 2021-12-20 DIAGNOSIS — K5090 Crohn's disease, unspecified, without complications: Secondary | ICD-10-CM | POA: Diagnosis present

## 2021-12-20 DIAGNOSIS — E538 Deficiency of other specified B group vitamins: Secondary | ICD-10-CM | POA: Diagnosis present

## 2021-12-20 DIAGNOSIS — Z853 Personal history of malignant neoplasm of breast: Secondary | ICD-10-CM

## 2021-12-20 DIAGNOSIS — Z83438 Family history of other disorder of lipoprotein metabolism and other lipidemia: Secondary | ICD-10-CM

## 2021-12-20 DIAGNOSIS — E039 Hypothyroidism, unspecified: Secondary | ICD-10-CM | POA: Diagnosis present

## 2021-12-20 DIAGNOSIS — J301 Allergic rhinitis due to pollen: Secondary | ICD-10-CM | POA: Diagnosis present

## 2021-12-20 DIAGNOSIS — G43909 Migraine, unspecified, not intractable, without status migrainosus: Secondary | ICD-10-CM | POA: Diagnosis present

## 2021-12-20 DIAGNOSIS — I420 Dilated cardiomyopathy: Secondary | ICD-10-CM | POA: Diagnosis present

## 2021-12-20 DIAGNOSIS — Z8261 Family history of arthritis: Secondary | ICD-10-CM | POA: Diagnosis not present

## 2021-12-20 DIAGNOSIS — I2510 Atherosclerotic heart disease of native coronary artery without angina pectoris: Secondary | ICD-10-CM | POA: Diagnosis present

## 2021-12-20 DIAGNOSIS — Z9109 Other allergy status, other than to drugs and biological substances: Secondary | ICD-10-CM | POA: Diagnosis not present

## 2021-12-20 DIAGNOSIS — Z923 Personal history of irradiation: Secondary | ICD-10-CM | POA: Diagnosis not present

## 2021-12-20 DIAGNOSIS — Z882 Allergy status to sulfonamides status: Secondary | ICD-10-CM

## 2021-12-20 DIAGNOSIS — Z8541 Personal history of malignant neoplasm of cervix uteri: Secondary | ICD-10-CM

## 2021-12-20 DIAGNOSIS — Z833 Family history of diabetes mellitus: Secondary | ICD-10-CM

## 2021-12-20 DIAGNOSIS — Z86010 Personal history of colonic polyps: Secondary | ICD-10-CM | POA: Diagnosis not present

## 2021-12-20 DIAGNOSIS — E119 Type 2 diabetes mellitus without complications: Secondary | ICD-10-CM | POA: Diagnosis present

## 2021-12-20 DIAGNOSIS — K589 Irritable bowel syndrome without diarrhea: Secondary | ICD-10-CM | POA: Diagnosis present

## 2021-12-20 DIAGNOSIS — Z96641 Presence of right artificial hip joint: Principal | ICD-10-CM | POA: Diagnosis present

## 2021-12-20 DIAGNOSIS — K219 Gastro-esophageal reflux disease without esophagitis: Secondary | ICD-10-CM | POA: Diagnosis present

## 2021-12-20 DIAGNOSIS — Z7984 Long term (current) use of oral hypoglycemic drugs: Secondary | ICD-10-CM

## 2021-12-20 DIAGNOSIS — K5190 Ulcerative colitis, unspecified, without complications: Secondary | ICD-10-CM | POA: Diagnosis present

## 2021-12-20 DIAGNOSIS — F419 Anxiety disorder, unspecified: Secondary | ICD-10-CM | POA: Diagnosis present

## 2021-12-20 HISTORY — PX: TOTAL HIP ARTHROPLASTY: SHX124

## 2021-12-20 LAB — CBC
HCT: 36.1 % (ref 36.0–46.0)
Hemoglobin: 12 g/dL (ref 12.0–15.0)
MCH: 29.5 pg (ref 26.0–34.0)
MCHC: 33.2 g/dL (ref 30.0–36.0)
MCV: 88.7 fL (ref 80.0–100.0)
Platelets: 368 10*3/uL (ref 150–400)
RBC: 4.07 MIL/uL (ref 3.87–5.11)
RDW: 11.9 % (ref 11.5–15.5)
WBC: 18.4 10*3/uL — ABNORMAL HIGH (ref 4.0–10.5)
nRBC: 0 % (ref 0.0–0.2)

## 2021-12-20 LAB — CREATININE, SERUM
Creatinine, Ser: 0.78 mg/dL (ref 0.44–1.00)
GFR, Estimated: >60 mL/min (ref >60)

## 2021-12-20 LAB — GLUCOSE, CAPILLARY
Glucose-Capillary: 124 mg/dL — ABNORMAL HIGH (ref 70–99)
Glucose-Capillary: 103 mg/dL — ABNORMAL HIGH (ref 70–99)

## 2021-12-20 SURGERY — ARTHROPLASTY, HIP, TOTAL, ANTERIOR APPROACH
Anesthesia: Monitor Anesthesia Care | Site: Hip | Laterality: Right

## 2021-12-20 MED ORDER — PROPOFOL 500 MG/50ML IV EMUL
INTRAVENOUS | Status: DC | PRN
  Administered 2021-12-20: 150 ug/kg/min via INTRAVENOUS

## 2021-12-20 MED ORDER — POVIDONE-IODINE 10 % EX SWAB: 2.0000 | Freq: Once | CUTANEOUS | Status: DC

## 2021-12-20 MED ORDER — EPHEDRINE SULFATE-NACL 50-0.9 MG/10ML-% IV SOSY
PREFILLED_SYRINGE | INTRAVENOUS | Status: DC | PRN
  Administered 2021-12-20: 10 mg via INTRAVENOUS

## 2021-12-20 MED ORDER — AMISULPRIDE (ANTIEMETIC) 5 MG/2ML IV SOLN: 10.0000 mg | Freq: Once | INTRAVENOUS | Status: DC | PRN

## 2021-12-20 MED ORDER — MAGNESIUM CITRATE PO SOLN: 1.0000 | Freq: Once | ORAL | Status: DC | PRN

## 2021-12-20 MED ORDER — METOCLOPRAMIDE HCL 5 MG/ML IJ SOLN: 5.0000 mg | Freq: Three times a day (TID) | INTRAMUSCULAR | Status: DC | PRN

## 2021-12-20 MED ORDER — SPIRONOLACTONE 25 MG PO TABS
25.0000 mg | ORAL_TABLET | Freq: Every day | ORAL | Status: DC
  Administered 2021-12-20 – 2021-12-21 (×2): 25 mg via ORAL
  Filled 2021-12-20 (×2): qty 1

## 2021-12-20 MED ORDER — MIDAZOLAM HCL 2 MG/2ML IJ SOLN
INTRAMUSCULAR | Status: DC | PRN
  Administered 2021-12-20: 2 mg via INTRAVENOUS

## 2021-12-20 MED ORDER — LORATADINE 10 MG PO TABS
10.0000 mg | ORAL_TABLET | Freq: Every day | ORAL | Status: DC
  Administered 2021-12-21: 10 mg via ORAL
  Filled 2021-12-20: qty 1

## 2021-12-20 MED ORDER — MONTELUKAST SODIUM 10 MG PO TABS
10.0000 mg | ORAL_TABLET | Freq: Every day | ORAL | Status: DC
  Administered 2021-12-20: 10 mg via ORAL
  Filled 2021-12-20: qty 1

## 2021-12-20 MED ORDER — ORAL CARE MOUTH RINSE: 15.0000 mL | Freq: Once | OROMUCOSAL | Status: AC

## 2021-12-20 MED ORDER — ONDANSETRON HCL 4 MG/2ML IJ SOLN: 4.0000 mg | Freq: Once | INTRAMUSCULAR | Status: DC | PRN

## 2021-12-20 MED ORDER — ALUM & MAG HYDROXIDE-SIMETH 200-200-20 MG/5ML PO SUSP: 30.0000 mL | ORAL | Status: DC | PRN

## 2021-12-20 MED ORDER — CHLORHEXIDINE GLUCONATE 0.12 % MT SOLN
15.0000 mL | Freq: Once | OROMUCOSAL | Status: AC
  Administered 2021-12-20: 15 mL via OROMUCOSAL
  Filled 2021-12-20: qty 15

## 2021-12-20 MED ORDER — METHOCARBAMOL 1000 MG/10ML IJ SOLN: 500.0000 mg | Freq: Four times a day (QID) | INTRAVENOUS | Status: DC | PRN

## 2021-12-20 MED ORDER — ONDANSETRON HCL 4 MG/2ML IJ SOLN: 4.0000 mg | Freq: Four times a day (QID) | INTRAMUSCULAR | Status: DC | PRN

## 2021-12-20 MED ORDER — SACUBITRIL-VALSARTAN 97-103 MG PO TABS
1.0000 | ORAL_TABLET | Freq: Two times a day (BID) | ORAL | Status: DC
  Administered 2021-12-20 – 2021-12-21 (×2): 1 via ORAL
  Filled 2021-12-20 (×4): qty 1

## 2021-12-20 MED ORDER — DOCUSATE SODIUM 100 MG PO CAPS
100.0000 mg | ORAL_CAPSULE | Freq: Two times a day (BID) | ORAL | Status: DC
  Administered 2021-12-20 – 2021-12-21 (×3): 100 mg via ORAL
  Filled 2021-12-20 (×3): qty 1

## 2021-12-20 MED ORDER — DIPHENOXYLATE-ATROPINE 2.5-0.025 MG PO TABS: 2.0000 | ORAL_TABLET | Freq: Four times a day (QID) | ORAL | Status: DC | PRN

## 2021-12-20 MED ORDER — ACETAMINOPHEN 500 MG PO TABS: 1000.0000 mg | ORAL_TABLET | Freq: Once | ORAL | Status: DC

## 2021-12-20 MED ORDER — OXYCODONE HCL 5 MG PO TABS
10.0000 mg | ORAL_TABLET | ORAL | Status: DC | PRN
  Administered 2021-12-20 (×2): 15 mg via ORAL
  Filled 2021-12-20 (×2): qty 3

## 2021-12-20 MED ORDER — POLYETHYLENE GLYCOL 3350 17 G PO PACK: 17.0000 g | PACK | Freq: Every day | ORAL | Status: DC | PRN

## 2021-12-20 MED ORDER — TRANEXAMIC ACID-NACL 1000-0.7 MG/100ML-% IV SOLN
1000.0000 mg | Freq: Once | INTRAVENOUS | Status: AC
  Administered 2021-12-20: 1000 mg via INTRAVENOUS
  Filled 2021-12-20: qty 100

## 2021-12-20 MED ORDER — IPRATROPIUM BROMIDE 0.06 % NA SOLN
2.0000 | Freq: Four times a day (QID) | NASAL | Status: DC
  Administered 2021-12-20: 2 via NASAL
  Filled 2021-12-20: qty 15

## 2021-12-20 MED ORDER — AZELASTINE HCL 0.1 % NA SOLN
2.0000 | Freq: Two times a day (BID) | NASAL | Status: DC
  Administered 2021-12-20 – 2021-12-21 (×2): 2 via NASAL
  Filled 2021-12-20: qty 30

## 2021-12-20 MED ORDER — METHOCARBAMOL 500 MG PO TABS
500.0000 mg | ORAL_TABLET | Freq: Four times a day (QID) | ORAL | Status: DC | PRN
  Administered 2021-12-20 – 2021-12-21 (×2): 500 mg via ORAL
  Filled 2021-12-20: qty 1

## 2021-12-20 MED ORDER — ROSUVASTATIN CALCIUM 20 MG PO TABS
20.0000 mg | ORAL_TABLET | Freq: Every day | ORAL | Status: DC
  Administered 2021-12-20 – 2021-12-21 (×2): 20 mg via ORAL
  Filled 2021-12-20 (×2): qty 1

## 2021-12-20 MED ORDER — HYDROMORPHONE HCL 1 MG/ML IJ SOLN
0.5000 mg | INTRAMUSCULAR | Status: DC | PRN
  Administered 2021-12-20: 0.5 mg via INTRAVENOUS

## 2021-12-20 MED ORDER — HYDROMORPHONE HCL 1 MG/ML IJ SOLN
0.2500 mg | INTRAMUSCULAR | Status: DC | PRN
  Administered 2021-12-20: 0.25 mg via INTRAVENOUS

## 2021-12-20 MED ORDER — DEXAMETHASONE SODIUM PHOSPHATE 10 MG/ML IJ SOLN
8.0000 mg | Freq: Once | INTRAMUSCULAR | Status: AC
  Administered 2021-12-20: 8 mg via INTRAVENOUS
  Filled 2021-12-20: qty 1

## 2021-12-20 MED ORDER — ENOXAPARIN SODIUM 30 MG/0.3ML IJ SOSY
30.0000 mg | PREFILLED_SYRINGE | INTRAMUSCULAR | Status: DC
  Administered 2021-12-21: 30 mg via SUBCUTANEOUS
  Filled 2021-12-20: qty 0.3

## 2021-12-20 MED ORDER — PROPOFOL 10 MG/ML IV BOLUS
INTRAVENOUS | Status: DC | PRN
  Administered 2021-12-20: 40 mg via INTRAVENOUS

## 2021-12-20 MED ORDER — MENTHOL 3 MG MT LOZG: 1.0000 | LOZENGE | OROMUCOSAL | Status: DC | PRN

## 2021-12-20 MED ORDER — BUPIVACAINE IN DEXTROSE 0.75-8.25 % IT SOLN
INTRATHECAL | Status: DC | PRN
  Administered 2021-12-20: 1.6 mL via INTRATHECAL

## 2021-12-20 MED ORDER — TRIAMCINOLONE ACETONIDE 55 MCG/ACT NA AERO
2.0000 | INHALATION_SPRAY | Freq: Every day | NASAL | Status: DC
Start: 2021-12-21 — End: 2021-12-21
  Administered 2021-12-21: 2 via NASAL
  Filled 2021-12-20: qty 21.6

## 2021-12-20 MED ORDER — TRAMADOL HCL 50 MG PO TABS
50.0000 mg | ORAL_TABLET | Freq: Four times a day (QID) | ORAL | Status: DC
  Administered 2021-12-20 – 2021-12-21 (×4): 50 mg via ORAL
  Filled 2021-12-20 (×4): qty 1

## 2021-12-20 MED ORDER — FENTANYL CITRATE (PF) 250 MCG/5ML IJ SOLN
INTRAMUSCULAR | Status: DC | PRN
  Administered 2021-12-20: 100 ug via INTRAVENOUS

## 2021-12-20 MED ORDER — DAPAGLIFLOZIN PROPANEDIOL 10 MG PO TABS
10.0000 mg | ORAL_TABLET | Freq: Every day | ORAL | Status: DC
  Administered 2021-12-21: 10 mg via ORAL
  Filled 2021-12-20 (×2): qty 1

## 2021-12-20 MED ORDER — DIPHENHYDRAMINE HCL 50 MG/ML IJ SOLN
INTRAMUSCULAR | Status: DC | PRN
  Administered 2021-12-20: 25 mg via INTRAVENOUS

## 2021-12-20 MED ORDER — 0.9 % SODIUM CHLORIDE (POUR BTL) OPTIME
TOPICAL | Status: DC | PRN
  Administered 2021-12-20: 1000 mL

## 2021-12-20 MED ORDER — METOCLOPRAMIDE HCL 5 MG PO TABS: 5.0000 mg | ORAL_TABLET | Freq: Three times a day (TID) | ORAL | Status: DC | PRN

## 2021-12-20 MED ORDER — CARVEDILOL 12.5 MG PO TABS
12.5000 mg | ORAL_TABLET | Freq: Two times a day (BID) | ORAL | Status: DC
  Administered 2021-12-20 – 2021-12-21 (×2): 12.5 mg via ORAL
  Filled 2021-12-20 (×2): qty 1

## 2021-12-20 MED ORDER — DEXAMETHASONE SODIUM PHOSPHATE 10 MG/ML IJ SOLN
10.0000 mg | Freq: Once | INTRAMUSCULAR | Status: AC
  Administered 2021-12-21: 10 mg via INTRAVENOUS
  Filled 2021-12-20: qty 1

## 2021-12-20 MED ORDER — PHENYLEPHRINE HCL-NACL 20-0.9 MG/250ML-% IV SOLN
INTRAVENOUS | Status: DC | PRN
  Administered 2021-12-20: 50 ug/min via INTRAVENOUS
  Administered 2021-12-20: 25 ug/min via INTRAVENOUS

## 2021-12-20 MED ORDER — METFORMIN HCL 500 MG PO TABS
500.0000 mg | ORAL_TABLET | Freq: Two times a day (BID) | ORAL | Status: DC
  Administered 2021-12-20 – 2021-12-21 (×2): 500 mg via ORAL
  Filled 2021-12-20 (×2): qty 1

## 2021-12-20 MED ORDER — FENTANYL CITRATE (PF) 250 MCG/5ML IJ SOLN
INTRAMUSCULAR | Status: AC
  Filled 2021-12-20: qty 5

## 2021-12-20 MED ORDER — MOMETASONE FURO-FORMOTEROL FUM 200-5 MCG/ACT IN AERO
2.0000 | INHALATION_SPRAY | Freq: Two times a day (BID) | RESPIRATORY_TRACT | Status: DC
  Administered 2021-12-20 – 2021-12-21 (×2): 2 via RESPIRATORY_TRACT
  Filled 2021-12-20: qty 8.8

## 2021-12-20 MED ORDER — ONDANSETRON HCL 4 MG PO TABS: 4.0000 mg | ORAL_TABLET | Freq: Four times a day (QID) | ORAL | Status: DC | PRN

## 2021-12-20 MED ORDER — CEFAZOLIN SODIUM-DEXTROSE 1-4 GM/50ML-% IV SOLN
1.0000 g | Freq: Four times a day (QID) | INTRAVENOUS | Status: AC
  Administered 2021-12-20 (×2): 1 g via INTRAVENOUS
  Filled 2021-12-20 (×2): qty 50

## 2021-12-20 MED ORDER — ZOLPIDEM TARTRATE 5 MG PO TABS
5.0000 mg | ORAL_TABLET | Freq: Every evening | ORAL | Status: DC | PRN
  Administered 2021-12-20: 5 mg via ORAL
  Filled 2021-12-20: qty 1

## 2021-12-20 MED ORDER — MIDAZOLAM HCL 2 MG/2ML IJ SOLN
INTRAMUSCULAR | Status: AC
  Filled 2021-12-20: qty 2

## 2021-12-20 MED ORDER — SODIUM CHLORIDE (PF) 0.9 % IJ SOLN
INTRAMUSCULAR | Status: DC | PRN
  Administered 2021-12-20: 50 mL

## 2021-12-20 MED ORDER — VENLAFAXINE HCL ER 150 MG PO CP24
150.0000 mg | ORAL_CAPSULE | Freq: Every day | ORAL | Status: DC
  Administered 2021-12-21: 150 mg via ORAL
  Filled 2021-12-20: qty 1

## 2021-12-20 MED ORDER — HYDROMORPHONE HCL 1 MG/ML IJ SOLN
INTRAMUSCULAR | Status: AC
  Filled 2021-12-20: qty 1

## 2021-12-20 MED ORDER — LACTATED RINGERS IV SOLN: INTRAVENOUS | Status: DC

## 2021-12-20 MED ORDER — LEVOTHYROXINE SODIUM 75 MCG PO TABS
75.0000 ug | ORAL_TABLET | Freq: Every day | ORAL | Status: DC
Start: 2021-12-21 — End: 2021-12-21
  Administered 2021-12-21: 75 ug via ORAL
  Filled 2021-12-20: qty 1

## 2021-12-20 MED ORDER — ACETAMINOPHEN 325 MG PO TABS
325.0000 mg | ORAL_TABLET | Freq: Four times a day (QID) | ORAL | Status: DC | PRN
  Filled 2021-12-20: qty 2

## 2021-12-20 MED ORDER — PHENOL 1.4 % MT LIQD: 1.0000 | OROMUCOSAL | Status: DC | PRN

## 2021-12-20 MED ORDER — CEFAZOLIN SODIUM-DEXTROSE 2-4 GM/100ML-% IV SOLN
2.0000 g | INTRAVENOUS | Status: AC
  Administered 2021-12-20: 2 g via INTRAVENOUS

## 2021-12-20 MED ORDER — BISACODYL 10 MG RE SUPP: 10.0000 mg | Freq: Every day | RECTAL | Status: DC | PRN

## 2021-12-20 MED ORDER — OXYCODONE HCL 5 MG PO TABS
5.0000 mg | ORAL_TABLET | ORAL | Status: DC | PRN
  Administered 2021-12-21: 10 mg via ORAL
  Filled 2021-12-20: qty 2

## 2021-12-20 MED ORDER — BUPIVACAINE LIPOSOME 1.3 % IJ SUSP
INTRAMUSCULAR | Status: AC
  Filled 2021-12-20: qty 20

## 2021-12-20 MED ORDER — ACETAMINOPHEN 500 MG PO TABS
1000.0000 mg | ORAL_TABLET | Freq: Four times a day (QID) | ORAL | Status: AC
  Administered 2021-12-20 – 2021-12-21 (×4): 1000 mg via ORAL
  Filled 2021-12-20 (×4): qty 2

## 2021-12-20 MED ORDER — MESALAMINE 1.2 G PO TBEC
2.4000 g | DELAYED_RELEASE_TABLET | Freq: Two times a day (BID) | ORAL | Status: DC
  Administered 2021-12-20 – 2021-12-21 (×2): 2.4 g via ORAL
  Filled 2021-12-20 (×3): qty 2

## 2021-12-20 MED ORDER — HYOSCYAMINE SULFATE ER 0.375 MG PO TB12: 0.3750 mg | ORAL_TABLET | Freq: Two times a day (BID) | ORAL | Status: DC | PRN

## 2021-12-20 MED ORDER — HYDROCODONE-ACETAMINOPHEN 7.5-325 MG PO TABS: 1.0000 | ORAL_TABLET | Freq: Once | ORAL | Status: DC | PRN

## 2021-12-20 MED ORDER — METHOCARBAMOL 500 MG PO TABS
ORAL_TABLET | ORAL | Status: AC
  Filled 2021-12-20: qty 1

## 2021-12-20 MED ORDER — ACETAMINOPHEN 500 MG PO TABS
1000.0000 mg | ORAL_TABLET | Freq: Once | ORAL | Status: AC
  Administered 2021-12-20: 1000 mg via ORAL
  Filled 2021-12-20: qty 2

## 2021-12-20 MED ORDER — EXEMESTANE 25 MG PO TABS
25.0000 mg | ORAL_TABLET | Freq: Every day | ORAL | Status: DC
  Administered 2021-12-21: 25 mg via ORAL
  Filled 2021-12-20: qty 1

## 2021-12-20 MED ORDER — CEFAZOLIN SODIUM-DEXTROSE 2-4 GM/100ML-% IV SOLN
INTRAVENOUS | Status: AC
  Filled 2021-12-20: qty 100

## 2021-12-20 MED ORDER — TRANEXAMIC ACID-NACL 1000-0.7 MG/100ML-% IV SOLN
1000.0000 mg | INTRAVENOUS | Status: AC
  Administered 2021-12-20: 1000 mg via INTRAVENOUS
  Filled 2021-12-20: qty 100

## 2021-12-20 MED ORDER — SCOPOLAMINE 1 MG/3DAYS TD PT72
1.0000 | MEDICATED_PATCH | TRANSDERMAL | Status: DC
  Administered 2021-12-20: 1.5 mg via TRANSDERMAL
  Filled 2021-12-20: qty 1

## 2021-12-20 MED ORDER — ALBUTEROL SULFATE (2.5 MG/3ML) 0.083% IN NEBU
3.0000 mL | INHALATION_SOLUTION | Freq: Four times a day (QID) | RESPIRATORY_TRACT | Status: DC | PRN
Start: 2021-12-20 — End: 2021-12-21

## 2021-12-20 MED ORDER — BUPIVACAINE LIPOSOME 1.3 % IJ SUSP: 10.0000 mL | Freq: Once | INTRAMUSCULAR | Status: DC

## 2021-12-20 MED ORDER — DIPHENHYDRAMINE HCL 12.5 MG/5ML PO ELIX: 12.5000 mg | ORAL_SOLUTION | ORAL | Status: DC | PRN

## 2021-12-20 MED ORDER — PANTOPRAZOLE SODIUM 40 MG PO TBEC
40.0000 mg | DELAYED_RELEASE_TABLET | Freq: Every day | ORAL | Status: DC
  Administered 2021-12-20 – 2021-12-21 (×2): 40 mg via ORAL
  Filled 2021-12-20 (×2): qty 1

## 2021-12-20 SURGICAL SUPPLY — 64 items
ADH SKN CLS APL DERMABOND .7 (GAUZE/BANDAGES/DRESSINGS) ×1
BAG COUNTER SPONGE SURGICOUNT (BAG) ×1 IMPLANT
BAG SPNG CNTER NS LX DISP (BAG) ×1
BLADE SAG 18X100X1.27 (BLADE) IMPLANT
CLSR STERI-STRIP ANTIMIC 1/2X4 (GAUZE/BANDAGES/DRESSINGS) ×1 IMPLANT
COVER PERINEAL POST (MISCELLANEOUS) ×1 IMPLANT
COVER SURGICAL LIGHT HANDLE (MISCELLANEOUS) ×1 IMPLANT
DERMABOND ADVANCED (GAUZE/BANDAGES/DRESSINGS) ×1
DERMABOND ADVANCED .7 DNX12 (GAUZE/BANDAGES/DRESSINGS) IMPLANT
DRAPE C-ARM 42X72 X-RAY (DRAPES) ×1 IMPLANT
DRAPE STERI IOBAN 125X83 (DRAPES) ×1 IMPLANT
DRAPE U-SHAPE 47X51 STRL (DRAPES) IMPLANT
DRSG AQUACEL AG ADV 3.5X 6 (GAUZE/BANDAGES/DRESSINGS) IMPLANT
DRSG MEPILEX BORDER 4X8 (GAUZE/BANDAGES/DRESSINGS) ×1 IMPLANT
DURAPREP 26ML APPLICATOR (WOUND CARE) ×1 IMPLANT
ELECT BLADE 4.0 EZ CLEAN MEGAD (MISCELLANEOUS) ×1
ELECT REM PT RETURN 9FT ADLT (ELECTROSURGICAL) ×1
ELECTRODE BLDE 4.0 EZ CLN MEGD (MISCELLANEOUS) ×1 IMPLANT
ELECTRODE REM PT RTRN 9FT ADLT (ELECTROSURGICAL) ×1 IMPLANT
FACESHIELD WRAPAROUND (MASK) ×1 IMPLANT
FACESHIELD WRAPAROUND OR TEAM (MASK) ×1 IMPLANT
GLOVE BIO SURGEON STRL SZ7.5 (GLOVE) ×1 IMPLANT
GLOVE BIOGEL PI IND STRL 7.5 (GLOVE) ×1 IMPLANT
GLOVE BIOGEL PI IND STRL 8 (GLOVE) ×1 IMPLANT
GLOVE SURG SYN 7.5  E (GLOVE) ×1
GLOVE SURG SYN 7.5 E (GLOVE) ×1 IMPLANT
GLOVE SURG SYN 7.5 PF PI (GLOVE) ×1 IMPLANT
GOWN STRL REUS W/ TWL LRG LVL3 (GOWN DISPOSABLE) ×2 IMPLANT
GOWN STRL REUS W/ TWL XL LVL3 (GOWN DISPOSABLE) ×1 IMPLANT
GOWN STRL REUS W/TWL LRG LVL3 (GOWN DISPOSABLE) ×2
GOWN STRL REUS W/TWL XL LVL3 (GOWN DISPOSABLE) ×1
HEAD CERAMIC FEMORAL 36MM (Head) IMPLANT
INSERT 0 DEGREE 36 (Miscellaneous) IMPLANT
KIT BASIN OR (CUSTOM PROCEDURE TRAY) ×1 IMPLANT
KIT TURNOVER KIT B (KITS) ×1 IMPLANT
MANIFOLD NEPTUNE II (INSTRUMENTS) ×1 IMPLANT
NDL 18GX1X1/2 (RX/OR ONLY) (NEEDLE) IMPLANT
NEEDLE 18GX1X1/2 (RX/OR ONLY) (NEEDLE) IMPLANT
NEEDLE HYPO 22GX1.5 SAFETY (NEEDLE) ×1 IMPLANT
NS IRRIG 1000ML POUR BTL (IV SOLUTION) ×1 IMPLANT
PACK TOTAL JOINT (CUSTOM PROCEDURE TRAY) ×1 IMPLANT
PAD ARMBOARD 7.5X6 YLW CONV (MISCELLANEOUS) ×1 IMPLANT
SCREW HEX LP 6.5X20 (Screw) IMPLANT
SHELL ACETAB TRIDENT 48 (Shell) IMPLANT
SPONGE T-LAP 18X18 ~~LOC~~+RFID (SPONGE) IMPLANT
STEM FEM ACCOLADE 38X102X30 S3 (Stem) IMPLANT
SUT MNCRL AB 3-0 PS2 18 (SUTURE) IMPLANT
SUT MNCRL AB 4-0 PS2 18 (SUTURE) ×1 IMPLANT
SUT STRATAFIX 1PDS 45CM VIOLET (SUTURE) ×1 IMPLANT
SUT VIC AB 0 CT1 27 (SUTURE) ×1
SUT VIC AB 0 CT1 27XBRD ANBCTR (SUTURE) ×2 IMPLANT
SUT VIC AB 1 CT1 27 (SUTURE) ×1
SUT VIC AB 1 CT1 27XBRD ANBCTR (SUTURE) ×2 IMPLANT
SUT VIC AB 2-0 CT1 27 (SUTURE) ×1
SUT VIC AB 2-0 CT1 TAPERPNT 27 (SUTURE) ×2 IMPLANT
SYR 20ML LL LF (SYRINGE) IMPLANT
SYR 50ML LL SCALE MARK (SYRINGE) ×2 IMPLANT
SYR BULB IRRIG 60ML STRL (SYRINGE) ×1 IMPLANT
TOWEL GREEN STERILE (TOWEL DISPOSABLE) ×1 IMPLANT
TOWEL GREEN STERILE FF (TOWEL DISPOSABLE) ×1 IMPLANT
TRAY CATH 16FR W/PLASTIC CATH (SET/KITS/TRAYS/PACK) IMPLANT
TRAY FOLEY MTR SLVR 16FR STAT (SET/KITS/TRAYS/PACK) IMPLANT
WATER STERILE IRR 1000ML POUR (IV SOLUTION) ×1 IMPLANT
YANKAUER SUCT BULB TIP NO VENT (SUCTIONS) ×2 IMPLANT

## 2021-12-20 NOTE — Anesthesia Procedure Notes (Signed)
Spinal  Patient location during procedure: OR Start time: 12/20/2021 7:27 AM End time: 12/20/2021 7:30 AM Reason for block: surgical anesthesia Staffing Performed: anesthesiologist  Anesthesiologist: Pervis Hocking, DO Performed by: Pervis Hocking, DO Authorized by: Pervis Hocking, DO   Preanesthetic Checklist Completed: patient identified, IV checked, risks and benefits discussed, surgical consent, monitors and equipment checked, pre-op evaluation and timeout performed Spinal Block Patient position: sitting Prep: DuraPrep and site prepped and draped Patient monitoring: cardiac monitor, continuous pulse ox and blood pressure Approach: midline Location: L3-4 Injection technique: single-shot Needle Needle type: Pencan  Needle gauge: 24 G Needle length: 9 cm Assessment Sensory level: T6 Events: CSF return Additional Notes Functioning IV was confirmed and monitors were applied. Sterile prep and drape, including hand hygiene and sterile gloves were used. The patient was positioned and the spine was prepped. The skin was anesthetized with lidocaine.  Free flow of clear CSF was obtained prior to injecting local anesthetic into the CSF.  The spinal needle aspirated freely following injection.  The needle was carefully withdrawn.  The patient tolerated the procedure well.

## 2021-12-20 NOTE — Interval H&P Note (Signed)
History and Physical Interval Note:  12/20/2021 7:00 AM  Erin Fields  has presented today for surgery, with the diagnosis of OA RIGHT HIP.  The various methods of treatment have been discussed with the patient and family. After consideration of risks, benefits and other options for treatment, the patient has consented to  Procedure(s): TOTAL HIP ARTHROPLASTY ANTERIOR APPROACH (Right) as a surgical intervention.  The patient's history has been reviewed, patient examined, no change in status, stable for surgery.  I have reviewed the patient's chart and labs.  Questions were answered to the patient's satisfaction.     [name]

## 2021-12-20 NOTE — Discharge Instructions (Signed)
POST-OPERATIVE OPIOID TAPER INSTRUCTIONS: It is important to wean off of your opioid medication as soon as possible. If you do not need pain medication after your surgery it is ok to stop day one. Opioids include: Codeine, Hydrocodone(Norco, Vicodin), Oxycodone(Percocet, oxycontin) and hydromorphone amongst others.  Long term and even short term use of opiods can cause: Increased pain response Dependence Constipation Depression Respiratory depression And more.  Withdrawal symptoms can include Flu like symptoms Nausea, vomiting And more Techniques to manage these symptoms Hydrate well Eat regular healthy meals Stay active Use relaxation techniques(deep breathing, meditating, yoga) Do Not substitute Alcohol to help with tapering If you have been on opioids for less than two weeks and do not have pain than it is ok to stop all together.  Plan to wean off of opioids This plan should start within one week post op of your joint replacement. Maintain the same interval or time between taking each dose and first decrease the dose.  Cut the total daily intake of opioids by one tablet each day Next start to increase the time between doses. The last dose that should be eliminated is the evening dose.

## 2021-12-20 NOTE — Anesthesia Postprocedure Evaluation (Signed)
Anesthesia Post Note  Patient: [name]  Procedure(s) Performed: RIGHT TOTAL HIP ARTHROPLASTY ANTERIOR APPROACH (Right: Hip)     Patient location during evaluation: PACU Anesthesia Type: MAC and Spinal Level of consciousness: awake and alert and oriented Pain management: pain level controlled Vital Signs Assessment: post-procedure vital signs reviewed and stable Respiratory status: spontaneous breathing, nonlabored ventilation and respiratory function stable Cardiovascular status: blood pressure returned to baseline and stable Postop Assessment: no headache, no backache, spinal receding and patient able to bend at knees Anesthetic complications: no   No notable events documented.  Last Vitals:  Vitals:   12/20/21 1030 12/20/21 1045  BP: 116/66 115/65  Pulse: 71 68  Resp: 17 18  Temp:    SpO2: 95% 95%    Last Pain:  Vitals:   12/20/21 1045  TempSrc:   PainSc: [place]

## 2021-12-20 NOTE — Op Note (Signed)
12/20/2021  8:36 AM  PATIENT:  Erin Fields   MRN: [id number]  PRE-OPERATIVE DIAGNOSIS:  OA RIGHT HIP  POST-OPERATIVE DIAGNOSIS:  OA RIGHT HIP  PROCEDURE:  Procedure(s): RIGHT TOTAL HIP ARTHROPLASTY ANTERIOR APPROACH  PREOPERATIVE INDICATIONS:    Erin Fields is an 56 y.o. female who has a diagnosis of DJD (degenerative joint disease) and elected for surgical management after failing conservative treatment.  The risks benefits and alternatives were discussed with the patient including but not limited to the risks of nonoperative treatment, versus surgical intervention including infection, bleeding, nerve injury, periprosthetic fracture, the need for revision surgery, dislocation, leg length discrepancy, blood clots, cardiopulmonary complications, morbidity, mortality, among others, and they were willing to proceed.     OPERATIVE REPORT     SURGEON:   [name], MD    ASSISTANT:  Aggie Moats, PA-C, he was present and scrubbed throughout the case, critical for completion in a timely fashion, and for retraction, instrumentation, and closure.     ANESTHESIA:  General    COMPLICATIONS:  None.     COMPONENTS:  Stryker acolade fit femur size 3 with a 36 mm -0 head ball and an acetabular shell size 48 with a  polyethylene liner    PROCEDURE IN DETAIL:   The patient was met in the holding area and  identified.  The appropriate hip was identified and marked at the operative site.  The patient was then transported to the OR  and  placed under anesthesia per that record.  At that point, the patient was  placed in the supine position and  secured to the operating room table and all bony prominences padded. He received pre-operative antibiotics    The operative lower extremity was prepped from the iliac crest to the distal leg.  Sterile draping was performed.  Time out was performed prior to incision.      Skin incision was made just 2 cm lateral to the ASIS  extending in line  with the tensor fascia lata. Electrocautery was used to control all bleeders. I dissected down sharply to the fascia of the tensor fascia lata was confirmed that the muscle fibers beneath were running posteriorly. I then incised the fascia over the superficial tensor fascia lata in line with the incision. The fascia was elevated off the anterior aspect of the muscle the muscle was retracted posteriorly and protected throughout the case. I then used electrocautery to incise the tensor fascia lata fascia control and all bleeders. Immediately visible was the fat over top of the anterior neck and capsule.  I removed the anterior fat from the capsule and elevated the rectus muscle off of the anterior capsule. I then removed a large time of capsule. The retractors were then placed over the anterior acetabulum as well as around the superior and inferior neck.  I then made a femoral neck cut. Then used the power corkscrew to remove the femoral head from the acetabulum and thoroughly irrigated the acetabulum. I sized the femoral head.    I then exposed the deep acetabulum, cleared out any tissue including the ligamentum teres.   After adequate visualization, I excised the labrum, and then sequentially reamed.  I then impacted the acetabular implant into place using fluoroscopy for guidance.  Appropriate version and inclination was confirmed clinically matching their bony anatomy, and with fluoroscopy.  I placed a 20 mm screw in the posterior/superio position with an excellent bite.    I then placed the polyethylene liner  in place  I then adducted the leg and released the external rotators from the posterior femur allowing it to be easily delivered up lateral and anterior to the acetabulum for preparation of the femoral canal.    I then prepared the proximal femur using the cookie-cutter and then sequentially reamed and broached.  A trial broach, neck, and head was utilized, and I reduced the hip and used  floroscopy to assess the neck length and femoral implant.  I then impacted the femoral prosthesis into place into the appropriate version. The hip was then reduced and fluoroscopy confirmed appropriate position. Leg lengths were restored.  I then irrigated the hip copiously again with, and repaired the fascia with Vicryl, followed by monocryl for the subcutaneous tissue, Monocryl for the skin, Steri-Strips and sterile gauze. The patient was then awakened and returned to PACU in stable and satisfactory condition. There were no complications.  POST OPERATIVE PLAN: WBAT, DVT px: SCD's/TED, ambulation and chemical dvt px  Edmonia Lynch, MD Orthopedic Surgeon [phone]

## 2021-12-20 NOTE — Plan of Care (Signed)
  Problem: Activity: Goal: Ability to avoid complications of mobility impairment will improve Outcome: Progressing Goal: Ability to tolerate increased activity will improve Outcome: Progressing   Problem: Pain Management: Goal: Pain level will decrease with appropriate interventions Outcome: Progressing   Problem: Education: Goal: Knowledge of General Education information will improve Description: Including pain rating scale, medication(s)/side effects and non-pharmacologic comfort measures Outcome: Progressing

## 2021-12-20 NOTE — Evaluation (Signed)
Physical Therapy Evaluation Patient Details Name: Erin Fields MRN: [id number] DOB: 1966-03-22 Today's Date: 12/20/2021  History of Present Illness  Pt adm 9/5 for rt THR direct anterior approach. PMHx:  perforated ventricle from ICD, DM, Cervical CA, CHF, dilatated CM, Crohn's ds, Breast CA, UC  Clinical Impression  Pt doing very well POD #0 with mobility. Expect pt to make excellent progress back to independence.        Recommendations for follow up therapy are one component of a multi-disciplinary discharge planning process, led by the attending physician.  Recommendations may be updated based on patient status, additional functional criteria and insurance authorization.  Follow Up Recommendations Follow physician's recommendations for discharge plan and follow up therapies      Assistance Recommended at Discharge PRN  Patient can return home with the following  Help with stairs or ramp for entrance;Assistance with cooking/housework;Assist for transportation;A little help with bathing/dressing/bathroom    Equipment Recommendations None recommended by PT  Recommendations for Other Services       Functional Status Assessment Patient has had a recent decline in their functional status and demonstrates the ability to make significant improvements in function in a reasonable and predictable amount of time.     Precautions / Restrictions Precautions Precautions: None Restrictions Weight Bearing Restrictions: Yes RLE Weight Bearing: Weight bearing as tolerated      Mobility  Bed Mobility Overal bed mobility: Needs Assistance Bed Mobility: Supine to Sit     Supine to sit: Supervision     General bed mobility comments: Pt using UE's to move RLE off of bed    Transfers Overall transfer level: Needs assistance Equipment used: Rolling walker (2 wheels) Transfers: Sit to/from Stand Sit to Stand: Supervision           General transfer comment: Verbal cues for hand  placement    Ambulation/Gait Ambulation/Gait assistance: Supervision Gait Distance (Feet): 125 Feet Assistive device: Rolling walker (2 wheels) Gait Pattern/deviations: Step-through pattern, Decreased stance time - right, Decreased step length - left Gait velocity: decr Gait velocity interpretation: 1.31 - 2.62 ft/sec, indicative of limited community ambulator   General Gait Details: supervision for safety  Stairs            Wheelchair Mobility    Modified Rankin (Stroke Patients Only)       Balance Overall balance assessment: No apparent balance deficits (not formally assessed)                                           Pertinent Vitals/Pain Pain Assessment Pain Assessment: Faces Faces Pain Scale: Hurts little more Pain Location: rt hip Pain Descriptors / Indicators: Operative site guarding, Sore Pain Intervention(s): Limited activity within patient's tolerance, Monitored during session, Repositioned, Ice applied    Home Living Family/patient expects to be discharged to:: Private residence Living Arrangements: Children Available Help at Discharge: Family;Available PRN/intermittently Type of Home: House Home Access: Stairs to enter Entrance Stairs-Rails: None Entrance Stairs-Number of Steps: 1   Home Layout: One level Home Equipment: Conservation officer, nature (2 wheels);BSC/3in1      Prior Function Prior Level of Function : Independent/Modified Independent;Driving                     Hand Dominance        Extremity/Trunk Assessment   Upper Extremity Assessment Upper Extremity Assessment: Overall WFL for tasks  assessed    Lower Extremity Assessment Lower Extremity Assessment: RLE deficits/detail RLE Deficits / Details: Knee and ankle WFL, Hip limited by post op soreness       Communication   Communication: No difficulties  Cognition Arousal/Alertness: Awake/alert Behavior During Therapy: WFL for tasks assessed/performed Overall  Cognitive Status: Within Functional Limits for tasks assessed                                          General Comments      Exercises Total Joint Exercises Ankle Circles/Pumps: AROM, Right, 5 reps Quad Sets: AROM, Right, 5 reps Heel Slides: AAROM, Right, 5 reps Long Arc Quad: AROM, Right   Assessment/Plan    PT Assessment Patient needs continued PT services  PT Problem List Decreased strength;Decreased mobility;Pain       PT Treatment Interventions DME instruction;Gait training;Stair training;Functional mobility training;Therapeutic activities;Therapeutic exercise;Patient/family education    PT Goals (Current goals can be found in the Care Plan section)  Acute Rehab PT Goals Patient Stated Goal: return home PT Goal Formulation: With patient Time For Goal Achievement: 12/23/21 Potential to Achieve Goals: Good    Frequency 7X/week     Co-evaluation               AM-PAC PT "6 Clicks" Mobility  Outcome Measure Help needed turning from your back to your side while in a flat bed without using bedrails?: A Little Help needed moving from lying on your back to sitting on the side of a flat bed without using bedrails?: A Little Help needed moving to and from a bed to a chair (including a wheelchair)?: A Little Help needed standing up from a chair using your arms (e.g., wheelchair or bedside chair)?: A Little Help needed to walk in hospital room?: A Little Help needed climbing 3-5 steps with a railing? : A Little 6 Click Score: 18    End of Session Equipment Utilized During Treatment: Gait belt Activity Tolerance: Patient tolerated treatment well Patient left: in chair;with call bell/phone within reach;with family/visitor present Nurse Communication: Mobility status;Other (comment) (needs diet order) PT Visit Diagnosis: Other abnormalities of gait and mobility (R26.89);Pain Pain - Right/Left: Right Pain - part of body: Hip    Time: 1456-1520 PT  Time Calculation (min) (ACUTE ONLY): 24 min   Charges:   PT Evaluation $PT Eval Low Complexity: 1 Low PT Treatments $Gait Training: 8-22 mins        [place] Office [place] 12/20/2021, 3:36 PM

## 2021-12-20 NOTE — Transfer of Care (Signed)
Immediate Anesthesia Transfer of Care Note  Patient: Erin Fields  Procedure(s) Performed: RIGHT TOTAL HIP ARTHROPLASTY ANTERIOR APPROACH (Right: Hip)  Patient Location: PACU  Anesthesia Type:MAC combined with regional for post-op pain  Level of Consciousness: awake, alert  and oriented  Airway & Oxygen Therapy: Patient Spontanous Breathing  Post-op Assessment: Report given to RN and Post -op Vital signs reviewed and stable  Post vital signs: Reviewed and stable  Last Vitals:  Vitals Value Taken Time  BP 100/60   Temp 36   Pulse 82   Resp 16   SpO2 96     Last Pain:  Vitals:   12/20/21 0633  TempSrc:   PainSc: 4       Patients Stated Pain Goal: 1 (01/75/10 2585)  Complications: No notable events documented.

## 2021-12-21 ENCOUNTER — Encounter: Payer: Self-pay | Admitting: Cardiology

## 2021-12-21 ENCOUNTER — Encounter (HOSPITAL_COMMUNITY): Payer: Self-pay | Admitting: Orthopedic Surgery

## 2021-12-21 LAB — CBC WITH DIFFERENTIAL/PLATELET
Abs Immature Granulocytes: 0.16 10*3/uL — ABNORMAL HIGH (ref 0.00–0.07)
Basophils Absolute: 0 10*3/uL (ref 0.0–0.1)
Basophils Relative: 0 %
Eosinophils Absolute: 0 10*3/uL (ref 0.0–0.5)
Eosinophils Relative: 0 %
HCT: 30.9 % — ABNORMAL LOW (ref 36.0–46.0)
Hemoglobin: 10.5 g/dL — ABNORMAL LOW (ref 12.0–15.0)
Immature Granulocytes: 1 %
Lymphocytes Relative: 6 %
Lymphs Abs: 1.1 10*3/uL (ref 0.7–4.0)
MCH: 29.8 pg (ref 26.0–34.0)
MCHC: 34 g/dL (ref 30.0–36.0)
MCV: 87.8 fL (ref 80.0–100.0)
Monocytes Absolute: 0.9 10*3/uL (ref 0.1–1.0)
Monocytes Relative: 5 %
Neutro Abs: 16 10*3/uL — ABNORMAL HIGH (ref 1.7–7.7)
Neutrophils Relative %: 88 %
Platelets: 364 10*3/uL (ref 150–400)
RBC: 3.52 MIL/uL — ABNORMAL LOW (ref 3.87–5.11)
RDW: 12.2 % (ref 11.5–15.5)
WBC: 18.2 10*3/uL — ABNORMAL HIGH (ref 4.0–10.5)
nRBC: 0 % (ref 0.0–0.2)

## 2021-12-21 MED ORDER — ACETAMINOPHEN 500 MG PO TABS: 1000.0000 mg | ORAL_TABLET | Freq: Four times a day (QID) | ORAL | 0 refills | Status: AC | PRN

## 2021-12-21 MED ORDER — ONDANSETRON 4 MG PO TBDP: 4.0000 mg | ORAL_TABLET | Freq: Two times a day (BID) | ORAL | 0 refills | Status: DC | PRN

## 2021-12-21 MED ORDER — DOCUSATE SODIUM 100 MG PO CAPS: 100.0000 mg | ORAL_CAPSULE | Freq: Two times a day (BID) | ORAL | 0 refills | Status: AC | PRN

## 2021-12-21 MED ORDER — METHOCARBAMOL 750 MG PO TABS
750.0000 mg | ORAL_TABLET | Freq: Three times a day (TID) | ORAL | 0 refills | Status: DC | PRN
Start: 2021-12-21 — End: 2022-03-06

## 2021-12-21 MED ORDER — ASPIRIN EC 81 MG PO TBEC: 81.0000 mg | DELAYED_RELEASE_TABLET | Freq: Two times a day (BID) | ORAL | 0 refills | Status: DC

## 2021-12-21 MED ORDER — OXYCODONE HCL 10 MG PO TABS: 5.0000 mg | ORAL_TABLET | Freq: Four times a day (QID) | ORAL | 0 refills | Status: DC | PRN

## 2021-12-21 NOTE — Progress Notes (Signed)
    Subjective: Patient reports pain as moderate.  Tolerating diet.  Urinating.   No CP, SOB.  Has mobilized OOB with PT/OT.  Objective:   VITALS:   Vitals:   12/21/21 0545 12/21/21 0600 12/21/21 0738 12/21/21 0915  BP: 117/67  (!) 103/59   Pulse: 66 71 73   Resp: 19 15    Temp: 98 F (36.7 C)  98.3 F (36.8 C)   TempSrc: Oral  Oral   SpO2: 99% 96% 97% 99%  Weight:      Height:          Latest Ref Rng & Units 12/20/2021    2:20 PM 12/07/2021   11:00 AM 07/20/2021    9:34 AM  CBC  WBC 4.0 - 10.5 K/uL 18.4  7.3  9.7   Hemoglobin 12.0 - 15.0 g/dL 12.0  13.1  13.4   Hematocrit 36.0 - 46.0 % 36.1  39.1  40.8   Platelets 150 - 400 K/uL 368  427  411       Latest Ref Rng & Units 12/20/2021    2:20 PM 12/07/2021   11:00 AM 07/20/2021    9:34 AM  BMP  Glucose 70 - 99 mg/dL  163  191   BUN 6 - 20 mg/dL  8  10   Creatinine 0.44 - 1.00 mg/dL 0.78  0.80  0.82   Sodium 135 - 145 mmol/L  131  132   Potassium 3.5 - 5.1 mmol/L  4.1  4.6   Chloride 98 - 111 mmol/L  100  100   CO2 22 - 32 mmol/L  21  23   Calcium 8.9 - 10.3 mg/dL  9.3  9.4    Intake/Output      09/05 0701 09/06 0700 09/06 0701 09/07 0700   P.O. 980 120   I.V. (mL/kg) 700 (9.6)    IV Piggyback 50    Total Intake(mL/kg) 1730 (23.7) 120 (1.6)   Urine (mL/kg/hr) 150 (0.1)    Blood 200    Total Output 350    Net +1380 +120        Urine Occurrence 4 x 1 x      Physical Exam: General: NAD.  Sitting up in bedside chair, calm, comfortable Resp: No increased wob Cardio: regular rate and rhythm ABD soft Neurologically intact MSK Neurovascularly intact Sensation intact distally Intact pulses distally Dorsiflexion/Plantar flexion intact Incision: dressing C/D/I Ecchymosis to lateral thigh   Assessment: 1 Day Post-Op  S/P Procedure(s) (LRB): RIGHT TOTAL HIP ARTHROPLASTY ANTERIOR APPROACH (Right) by Dr. Ernesta Amble. Percell Miller on 12/20/21  Principal Problem:   S/P total right hip arthroplasty Active Problems:    DJD (degenerative joint disease)   Plan: Recheck WBC level  Advance diet Up with therapy Incentive Spirometry Elevate and Apply ice  Weightbearing: WBAT RLE Insicional and dressing care: Dressings left intact until follow-up and Reinforce dressings as needed Orthopedic device(s): None Showering: Keep dressing dry VTE prophylaxis: Aspirin 72m BID  x 30 days , SCDs, ambulation Pain control: continue current regimen Follow - up plan: 2 weeks Contact information:  TEdmonia LynchMD, MAggie MoatsPA-C  Dispo: Home after passing PT/OT evaluations     MAlisa GraffOffice [phone]/09/2021, 12:32 PM

## 2021-12-21 NOTE — Discharge Summary (Signed)
Physician Discharge Summary  Patient ID: Erin Fields MRN: [id number] DOB/AGE: 1966-02-17 56 y.o.  Admit date: 12/20/2021 Discharge date: 12/21/2021  Admission Diagnoses: right hip OA  Discharge Diagnoses:  Principal Problem:   S/P total right hip arthroplasty Active Problems:   DJD (degenerative joint disease)   Discharged Condition: fair  Hospital Course: Patient underwent a right THA by Dr. Percell Miller on 8/0/16 without complications. She spent the night in the hospital for pain control, mobilization, and close cardiac monitoring given her past cardiac history. She has passed her PT evaluation and had no cardiac issues. She is ready to discharge home.   Consults: None  Significant Diagnostic Studies: n/a  Treatments: IV hydration, antibiotics: Ancef, analgesia: acetaminophen, Vicodin, Dilaudid, Tramadol and Oxycodone, anticoagulation: Lovenox, therapies: PT and OT, and surgery: right THA  Discharge Exam: Blood pressure 115/60, pulse 78, temperature 98.1 F (36.7 C), temperature source Oral, resp. rate 15, height 5' 3.5" (1.613 m), weight 72.9 kg, SpO2 94 %. General appearance: alert, cooperative, and no distress Head: Normocephalic, without obvious abnormality, atraumatic Resp: clear to auscultation bilaterally Cardio: regular rate and rhythm, S1, S2 normal, no murmur, click, rub or gallop Extremities: extremities normal, atraumatic, no cyanosis or edema Pulses:  L brachial 2+ R brachial 2+  L radial 2+ R radial 2+  L inguinal 2+ R inguinal 2+  L popliteal 2+ R popliteal 2+  L posterior tibial 2+ R posterior tibial 2+  L dorsalis pedis 2+ R dorsalis pedis 2+   Neurologic: Grossly normal Incision/Wound: c/d/i  Disposition: Discharge disposition: 01-Home or Self Care       Discharge Instructions     Call MD / Call 911   Complete by: As directed    If you experience chest pain or shortness of breath, CALL 911 and be transported to the hospital emergency room.  If  you develope a fever above 101 F, pus (white drainage) or increased drainage or redness at the wound, or calf pain, call your surgeon's office.   Diet - low sodium heart healthy   Complete by: As directed    Discharge instructions   Complete by: As directed    You may bear weight as tolerated. Keep your dressing on and dry until follow up. Take medicine to prevent blood clots as directed. Take pain medicine as needed with the goal of transitioning to over the counter medicines.    INSTRUCTIONS AFTER JOINT REPLACEMENT   Remove items at home which could result in a fall. This includes throw rugs or furniture in walking pathways ICE to the affected joint every three hours while awake for 30 minutes at a time, for at least the first 3-5 days, and then as needed for pain and swelling.  Continue to use ice for pain and swelling. You may notice swelling that will progress down to the foot and ankle.  This is normal after surgery.  Elevate your leg when you are not up walking on it.   Continue to use the breathing machine you got in the hospital (incentive spirometer) which will help keep your temperature down.  It is common for your temperature to cycle up and down following surgery, especially at night when you are not up moving around and exerting yourself.  The breathing machine keeps your lungs expanded and your temperature down.   DIET:  As you were doing prior to hospitalization, we recommend a well-balanced diet.  DRESSING / WOUND CARE / SHOWERING  You may shower 3 days after surgery,  but keep the wounds dry during showering.  You may use an occlusive plastic wrap (Press'n Seal for example) with blue painter's tape at edges, NO SOAKING/SUBMERGING IN THE BATHTUB.  If the bandage gets wet, call the office.   ACTIVITY  Increase activity slowly as tolerated, but follow the weight bearing instructions below.   No driving for 6 weeks or until further direction given by your physician.  You  cannot drive while taking narcotics.  No lifting or carrying greater than 10 lbs. until further directed by your surgeon. Avoid periods of inactivity such as sitting longer than an hour when not asleep. This helps prevent blood clots.  You may return to work once you are authorized by your doctor.    WEIGHT BEARING   Weight bearing as tolerated with assist device (walker, cane, etc) as directed, use it as long as suggested by your surgeon or therapist, typically at least 4-6 weeks.   EXERCISES  Results after joint replacement surgery are often greatly improved when you follow the exercise, range of motion and muscle strengthening exercises prescribed by your doctor. Safety measures are also important to protect the joint from further injury. Any time any of these exercises cause you to have increased pain or swelling, decrease what you are doing until you are comfortable again and then slowly increase them. If you have problems or questions, call your caregiver or physical therapist for advice.   Rehabilitation is important following a joint replacement. After just a few days of immobilization, the muscles of the leg can become weakened and shrink (atrophy).  These exercises are designed to build up the tone and strength of the thigh and leg muscles and to improve motion. Often times heat used for twenty to thirty minutes before working out will loosen up your tissues and help with improving the range of motion but do not use heat for the first two weeks following surgery (sometimes heat can increase post-operative swelling).   These exercises can be done on a training (exercise) mat, on the floor, on a table or on a bed. Use whatever works the best and is most comfortable for you.    Use music or television while you are exercising so that the exercises are a pleasant break in your day. This will make your life better with the exercises acting as a break in your routine that you can look forward  to.   Perform all exercises about fifteen times, three times per day or as directed.  You should exercise both the operative leg and the other leg as well.  Exercises include:   Quad Sets - Tighten up the muscle on the front of the thigh (Quad) and hold for 5-10 seconds.   Straight Leg Raises - With your knee straight (if you were given a brace, keep it on), lift the leg to 60 degrees, hold for 3 seconds, and slowly lower the leg.  Perform this exercise against resistance later as your leg gets stronger.  Leg Slides: Lying on your back, slowly slide your foot toward your buttocks, bending your knee up off the floor (only go as far as is comfortable). Then slowly slide your foot back down until your leg is flat on the floor again.  Angel Wings: Lying on your back spread your legs to the side as far apart as you can without causing discomfort.  Hamstring Strength:  Lying on your back, push your heel against the floor with your leg straight by tightening up  the muscles of your buttocks.  Repeat, but this time bend your knee to a comfortable angle, and push your heel against the floor.  You may put a pillow under the heel to make it more comfortable if necessary.   A rehabilitation program following joint replacement surgery can speed recovery and prevent re-injury in the future due to weakened muscles. Contact your doctor or a physical therapist for more information on knee rehabilitation.    CONSTIPATION  Constipation is defined medically as fewer than three stools per week and severe constipation as less than one stool per week.  Even if you have a regular bowel pattern at home, your normal regimen is likely to be disrupted due to multiple reasons following surgery.  Combination of anesthesia, postoperative narcotics, change in appetite and fluid intake all can affect your bowels.   YOU MUST use at least one of the following options; they are listed in order of increasing strength to get the job  done.  They are all available over the counter, and you may need to use some, POSSIBLY even all of these options:    Drink plenty of fluids (prune juice may be helpful) and high fiber foods Colace 100 mg by mouth twice a day  Senokot for constipation as directed and as needed Dulcolax (bisacodyl), take with full glass of water  Miralax (polyethylene glycol) once or twice a day as needed.  If you have tried all these things and are unable to have a bowel movement in the first 3-4 days after surgery call either your surgeon or your primary doctor.    If you experience loose stools or diarrhea, hold the medications until you stool forms back up.  If your symptoms do not get better within 1 week or if they get worse, check with your doctor.  If you experience "the worst abdominal pain ever" or develop nausea or vomiting, please contact the office immediately for further recommendations for treatment.   ITCHING:  If you experience itching with your medications, try taking only a single pain pill, or even half a pain pill at a time.  You can also use Benadryl over the counter for itching or also to help with sleep.   TED HOSE STOCKINGS:  Use stockings on both legs until for at least 2 weeks or as directed by physician office. They may be removed at night for sleeping.  MEDICATIONS:  See your medication summary on the "After Visit Summary" that nursing will review with you.  You may have some home medications which will be placed on hold until you complete the course of blood thinner medication.  It is important for you to complete the blood thinner medication as prescribed.  Take medicines as prescribed.   You have several different medicines that work in different ways. - Tylenol is for mild to moderate pain. Try to take this medicine before turning to your narcotic medicines.  - Robaxin is for muscle spasms. This medicine can make you drowsy. - Oxycodone is a narcotic pain medicine.  Take this for  severe pain. This medicine can be dehydrating / constipating. - Zofran is for nausea and vomiting. - Colace is for constipation prevention.  - Aspirin is to prevent blood clots after surgery. YOU MUST TAKE THIS MEDICINE!  PRECAUTIONS:  If you experience chest pain or shortness of breath - call 911 immediately for transfer to the hospital emergency department.   If you develop a fever greater that 101 F, purulent drainage from  wound, increased redness or drainage from wound, foul odor from the wound/dressing, or calf pain - CONTACT YOUR SURGEON.                                                   FOLLOW-UP APPOINTMENTS:  If you do not already have a post-op appointment, please call the office [phone] for an appointment to be seen by Dr. Percell Miller in 2 weeks.   OTHER INSTRUCTIONS:   MAKE SURE YOU:  Understand these instructions.  Get help right away if you are not doing well or get worse.    Thank you for letting us be a part of your medical care team.  It is a privilege we respect greatly.  We hope these instructions will help you stay on track for a fast and full recovery!   Driving restrictions   Complete by: As directed    No driving for 2-6 weeks   Post-operative opioid taper instructions:   Complete by: As directed    POST-OPERATIVE OPIOID TAPER INSTRUCTIONS: It is important to wean off of your opioid medication as soon as possible. If you do not need pain medication after your surgery it is ok to stop day one. Opioids include: Codeine, Hydrocodone(Norco, Vicodin), Oxycodone(Percocet, oxycontin) and hydromorphone amongst others.  Long term and even short term use of opiods can cause: Increased pain response Dependence Constipation Depression Respiratory depression And more.  Withdrawal symptoms can include Flu like symptoms Nausea, vomiting And more Techniques to manage these symptoms Hydrate well Eat regular healthy meals Stay active Use relaxation techniques(deep  breathing, meditating, yoga) Do Not substitute Alcohol to help with tapering If you have been on opioids for less than two weeks and do not have pain than it is ok to stop all together.  Plan to wean off of opioids This plan should start within one week post op of your joint replacement. Maintain the same interval or time between taking each dose and first decrease the dose.  Cut the total daily intake of opioids by one tablet each day Next start to increase the time between doses. The last dose that should be eliminated is the evening dose.      TED hose   Complete by: As directed    Use stockings (TED hose) for 2 weeks on right leg(s).  You may remove them at night for sleeping.   Weight bearing as tolerated   Complete by: As directed         Follow-up Information     [name], MD. Go on 01/04/2022.   Specialty: Orthopedic Surgery Why: at 4:15pm Contact information: [address] [place] [address] [phone]                 Signed: Alisa Graff 12/21/2021, 4:32 PM

## 2021-12-21 NOTE — Progress Notes (Signed)
Physical Therapy Treatment Patient Details Name: Erin Fields MRN: [id number] DOB: 1965/10/04 Today's Date: 12/21/2021   History of Present Illness Pt adm 9/5 for rt THR direct anterior approach. 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 Ambulation distance increased with supervision and RW. Patient elected to defer stair training but discussed safe stair negotiation for her 1 stair to enter her home. Encouraged continued mobility at home. D/c plan remains appropriate. Patient does not require PM session this date.     Recommendations for follow up therapy are one component of a multi-disciplinary discharge planning process, led by the attending physician.  Recommendations may be updated based on patient status, additional functional criteria and insurance authorization.  Follow Up Recommendations  Follow physician's recommendations for discharge plan and follow up therapies     Assistance Recommended at Discharge PRN  Patient can return home with the following Help with stairs or ramp for entrance;Assistance with cooking/housework;Assist for transportation;A little help with bathing/dressing/bathroom   Equipment Recommendations  None recommended by PT    Recommendations for Other Services       Precautions / Restrictions Precautions Precautions: None Restrictions Weight Bearing Restrictions: Yes RLE Weight Bearing: Weight bearing as tolerated     Mobility  Bed Mobility Overal bed mobility: Needs Assistance Bed Mobility: Supine to Sit     Supine to sit: Supervision     General bed mobility comments: cues to use L LE to assist R LE off bed.    Transfers Overall transfer level: Needs assistance Equipment used: Rolling Delphia Kaylor (2 wheels) Transfers: Sit to/from Stand Sit to Stand: Supervision                 Ambulation/Gait Ambulation/Gait assistance: Supervision Gait Distance (Feet): 250 Feet Assistive device: Rolling Crixus Mcaulay (2 wheels) Gait Pattern/deviations: Step-through pattern, Decreased stance time - right, Decreased step length - left Gait velocity: decreased     General Gait Details: supervision for safety. Cues for heel strike   Stairs Stairs:  (patient elected to defer stair training. Discussed stair negotiation for 1 step to enter)           Wheelchair Mobility    Modified Rankin (Stroke Patients Only)       Balance Overall balance assessment: No apparent balance deficits (not formally assessed)                                          Cognition Arousal/Alertness: Awake/alert Behavior During Therapy: WFL for tasks assessed/performed Overall Cognitive Status: Within Functional Limits for tasks assessed                                          Exercises      General Comments        Pertinent Vitals/Pain Pain Assessment Pain Assessment: Faces Faces Pain Scale: Hurts little more Pain Location: rt hip Pain Descriptors / Indicators: Sore Pain Intervention(s): Monitored during session    Home Living                          Prior Function  PT Goals (current goals can now be found in the care plan section) Acute Rehab PT Goals Patient Stated Goal: to go home today PT Goal Formulation: With patient Time For Goal Achievement: 12/23/21 Potential to Achieve Goals: Good Progress towards PT goals: Progressing toward goals    Frequency    7X/week      PT Plan Current plan remains appropriate    Co-evaluation              AM-PAC PT "6 Clicks" Mobility   Outcome Measure  Help needed turning from your back to your side while in a flat bed without using bedrails?: A Little Help needed moving from lying on your back to sitting on the side of a flat bed without using bedrails?: A  Little Help needed moving to and from a bed to a chair (including a wheelchair)?: A Little Help needed standing up from a chair using your arms (e.g., wheelchair or bedside chair)?: A Little Help needed to walk in hospital room?: A Little Help needed climbing 3-5 steps with a railing? : A Little 6 Click Score: 18    End of Session   Activity Tolerance: Patient tolerated treatment well Patient left: in chair;with call bell/phone within reach Nurse Communication: Mobility status PT Visit Diagnosis: Other abnormalities of gait and mobility (R26.89);Pain Pain - Right/Left: Right Pain - part of body: Hip     Time: 2633-3545 PT Time Calculation (min) (ACUTE ONLY): 12 min  Charges:  $Gait Training: 8-22 mins                     Caelin Rayl A. Gilford Rile PT, DPT Acute Rehabilitation Services Office [phone]    Linna Hoff 12/21/2021, 9:59 AM

## 2021-12-26 ENCOUNTER — Encounter: Payer: Self-pay | Admitting: Cardiology

## 2021-12-26 ENCOUNTER — Other Ambulatory Visit (HOSPITAL_COMMUNITY): Payer: Self-pay | Admitting: Cardiology

## 2021-12-27 ENCOUNTER — Other Ambulatory Visit (HOSPITAL_COMMUNITY): Payer: Self-pay | Admitting: *Deleted

## 2021-12-27 ENCOUNTER — Other Ambulatory Visit: Payer: Self-pay | Admitting: Cardiology

## 2021-12-27 MED ORDER — DAPAGLIFLOZIN PROPANEDIOL 10 MG PO TABS: 10.0000 mg | ORAL_TABLET | Freq: Every day | ORAL | 11 refills | Status: DC

## 2021-12-27 NOTE — Progress Notes (Unsigned)
[facility] [address], [place], [place] [address] [phone]  Date:  12/28/2021   Name:  Erin Fields   DOB:  16-Apr-1966   MRN:  [id number]  PCP:  Erin Mclean, MD    Chief Complaint: No chief complaint on file.   History of Present Illness:  Erin Fields is a 56 y.o. very pleasant female patient who presents with the following:  Virtual visit today for concern of illness- cough Pt location is home, I am at office Pt ID confirmed with 2 factors, she gives consent for a virtual visit today The pt and myself are present on the call today   She finally was able to get her hip replacement!  Done on 9/6 - history significant for right-sided breast cancer, type 2 diabetes, nonobstructive coronary artery disease, hypertension, hyperlipidemia, end-stage hip arthritis, previous history of cardiomyopathy and ICD placement with right ventricular perforation  I saw her on 8/2 also with a cough for about one week, treated with doxy  Seen by cardiology- Dr Erin Fields- 8/29:  1. Chronic systolic CHF: Nonischemic cardiomyopathy.  EF noted to be low since 1/22.  LHC in 3/22 with mild nonobstructive coronary disease.  Last echo in 6/22 with EF 30-35%.  Cardic MRI in 9/22 showed EF 35% with moderate LV hypokinesis, RV EF 44%, small area of LGE at the true apex (?small embolic apical infarct).  Echo today showed EF up to 40-45%.  Cause uncertain, her mother had a cardiomyopathy of uncertain etiology so cannot rule out familial cardiomyopathy.  She had treatment for breast cancer but this did not include chemotherapy.  She has been on adalimumab for ulcerative colitis, but this does not commonly cause CHF.  No drugs and rare ETOH.  The small area of apical LGE does not explain the cardiomyopathy.  NYHA class II symptoms, more limited by hip pain.  Not volume overloaded on exam.   - Continue Entresto 97/103 bid. Recent labs stable.   - Continue Coreg 12.5 mg bid, unable to tolerate higher dose.  - Continue dapagliflozin 10 mg daily.  - Continue spironolactone 25 mg daily.   - Patient has been unsure about placement again of ICD give complicated initial ICD placement.  Subcutaneous ICD would be an option. However, EF up to 40-45% on 2/23 echo, so out of ICD range.   - Repeat echo at followup in 6 months.  2. RV perforation with ICD placement: S/p sternotomy/repair in 6/22.   She had her hip replaced finally on 9/5- spent one night in the hospital She has been coughing for 5-6 days She is using her spirometer The cough can be productive of green mucus No body aches noted She feels fine except for her cough Did not test for covid as she does not have a test on hand She is vaccinated   She is using her walker and is getting around at home She is not driving- she had her right hip done  Patient Active Problem List   Diagnosis Date Noted   DJD (degenerative joint disease) 12/20/2021   S/P total right hip arthroplasty 12/20/2021   Breast cancer ([place]) 05/11/2021   Hemothorax on left 10/02/2020   S/P evacuation of hematoma 10/02/2020   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 10/02/2020   Hypovolemic shock ([place]) 10/02/2020   Anxiety    Coronary artery disease 25% of RCA, 20% intermediate branch, 40% diagonal branch  based on cardiac cath from 2022 07/22/2020   Dilated cardiomyopathy ([place]) ejection fraction 25% by echocardiogram from January 2022 04/29/2020   Congestive heart failure ([place]) 04/29/2020   Allergic rhinitis due to animal (cat) (dog) hair and dander 04/27/2020   Allergic rhinitis due to pollen 04/27/2020   Mild persistent asthma, uncomplicated 49/70/2637   PONV (postoperative nausea and vomiting)    Migraines    GERD (gastroesophageal reflux disease)    Environmental allergies    Diabetes mellitus without complication ([place])    Depression    Colon polyps    B12 deficiency 11/27/2019   IDA  (iron deficiency anemia) 11/06/2019   Asthma, persistent controlled 10/05/2018   Recurrent infections 10/05/2018   Anxiety and depression 01/20/2018   Right upper lobe pulmonary nodule 01/10/2018   Nasal polyp, unspecified 01/10/2018   Left sided abdominal pain 12/13/2017   Impaired fasting glucose 12/12/2017   History of adenomatous polyp of colon 11/19/2017   Genetic testing 07/16/2017   Family history of breast cancer    Controlled type 2 diabetes mellitus without complication, without long-term current use of insulin ([place]) 05/28/2017   Personal history of radiation therapy 2019   Cancer ([place]) 2019   Malignant neoplasm of upper-outer quadrant of right breast in female, estrogen receptor positive ([place]) 03/20/2017   Crohn disease ([place]) 11/29/2016   Borderline diabetes 12/17/2015   Ex-smoker 11/18/2015   Chronic venous insufficiency 09/02/2015   Symptomatic varicose veins, right 09/02/2015   Chronic sinusitis 01/26/2015   Eustachian tube dysfunction 12/04/2014   Other allergic rhinitis 09/15/2014   Dyspnea 09/15/2014   Liver hemangioma 08/24/2014   Elevated triglycerides with high cholesterol 11/26/2013   Insomnia 08/20/2013   Hypertension 08/20/2013   Ulcerative colitis ([place]) 04/20/2013   Oral herpes 04/20/2013   Hyperlipidemia 12/06/2006   HEADACHE 12/06/2006   CERVICAL CANCER, HX OF 12/06/2006   Hypothyroidism 11/29/2006   Asthma 11/29/2006   IRRITABLE BOWEL SYNDROME 11/29/2006    Past Medical History:  Diagnosis Date   Allergic rhinitis due to animal (cat) (dog) hair and dander 04/27/2020   Allergic rhinitis due to pollen 04/27/2020   Anxiety    Anxiety and depression 01/20/2018   Arthritis    Asthma    Asthma, persistent controlled 10/05/2018   B12 deficiency 11/27/2019   Borderline diabetes 12/17/2015   Breast cancer ([place])    Cancer ([place]) 2019   right breast cancer   CERVICAL CANCER, HX OF 12/06/2006   Qualifier: Diagnosis of  By: Arnoldo Morale MD, John E     Chronic sinusitis 01/26/2015   Chronic venous insufficiency 09/02/2015   Colon polyps    Congestive heart failure ([place]) 04/29/2020   Controlled type 2 diabetes mellitus without complication, without long-term current use of insulin ([place]) 05/28/2017   Coronary artery disease 25% of RCA, 20% intermediate branch, 40% diagonal branch based on cardiac cath from 2022 07/22/2020   Crohn disease ([place]) 11/29/2016   Depression    Diabetes mellitus without complication ([place])    type 2   Dilated cardiomyopathy ([facility]) ejection fraction 25% by echocardiogram from January 2022 04/29/2020   Dyspnea 09/15/2014   Elevated triglycerides with high cholesterol 11/26/2013   Environmental allergies    Eustachian tube dysfunction 12/04/2014   Ex-smoker 11/18/2015   Family history of breast cancer    Genetic testing 07/16/2017   Negative genetic testing on the multi-cancer panel.  The Multi-Gene Panel offered by [name] includes sequencing and/or deletion duplication testing of the following 83 genes: ALK, APC, ATM,  AXIN2,BAP1,  BARD1, BLM, BMPR1A, BRCA1, BRCA2, BRIP1, CASR, CDC73, CDH1, CDK4, CDKN1B, CDKN1C, CDKN2A (p14ARF), CDKN2A (p16INK4a), CEBPA, CHEK2, CTNNA1, DICER1, DIS3L2, EGFR (c.2369C>T, p.Thr790Met variant onl   GERD (gastroesophageal reflux disease)    HEADACHE 12/06/2006   Qualifier: Diagnosis of  By: Arnoldo Morale MD, John E    Hemothorax on left 10/02/2020   History of adenomatous polyp of colon 11/19/2017   Hyperlipidemia    Hypertension    Hypothyroidism    IDA (iron deficiency anemia) 11/06/2019   Impaired fasting glucose 12/12/2017   Insomnia 08/20/2013   Irritable bowel syndrome 11/29/2006   Qualifier: Diagnosis of  By: Arnoldo Morale MD, John E    Left sided abdominal pain 12/13/2017   Liver hemangioma 08/24/2014   Malignant neoplasm of upper-outer quadrant of right breast in female, estrogen receptor positive ([place]) 03/20/2017   Migraines    Mild persistent asthma, uncomplicated 87/86/7672    Nasal polyp, unspecified 01/10/2018   Oral herpes 04/20/2013   Other allergic rhinitis 09/15/2014   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 10/02/2020   Personal history of radiation therapy 2019   36 radiation tx   PONV (postoperative nausea and vomiting)    scopolamine patch helps   Recurrent infections 10/05/2018   Right upper lobe pulmonary nodule 01/10/2018   Symptomatic varicose veins, right 09/02/2015   Ulcerative colitis ([place])    Varicose veins     Past Surgical History:  Procedure Laterality Date   BREAST BIOPSY Right 02/2017   BREAST BIOPSY Right 11/07/2019   BREAST LUMPECTOMY Right 04/24/2017   BREAST LUMPECTOMY WITH RADIOACTIVE SEED AND SENTINEL LYMPH NODE BIOPSY Right 04/24/2017   Procedure: RIGHT BREAST LUMPECTOMY WITH RADIOACTIVE SEED AND SENTINEL LYMPH NODE BIOPSY;  Surgeon: [name], MD;  Location: [facility];  Service: General;  Laterality: Right;   CORONARY ARTERY BYPASS GRAFT     crapal tunnel relase Left 05/2018   ENDOMETRIAL ABLATION  2010   Had abnormal uterine bleeding (heavy and frequent), performed in [place], menses stopped completely 2 years after   EXPLORATORY LAPAROTOMY     Had exploratory surgery at the age of 76 for abdominal pain with cyst found on her left ovary, not treated surgically   FOOT SURGERY Right 2012   GENERATOR REMOVAL  10/02/2020   Procedure: GENERATOR REMOVAL;  Surgeon: Rexene Alberts, MD;  Location: [facility];  Service: Thoracic;;   HAND SURGERY Right    ICD IMPLANT N/A 10/01/2020   Procedure: ICD IMPLANT;  Surgeon: Constance Haw, MD;  Location: [facility];  Service: Cardiovascular;  Laterality: N/A;   ICD LEAD REMOVAL N/A 10/02/2020   Procedure: ICD LEAD REMOVAL;  Surgeon: Rexene Alberts, MD;  Location: [facility];  Service: Thoracic;  Laterality: N/A;   MEDIASTERNOTOMY N/A 10/02/2020   Procedure: MEDIAN STERNOTOMY REPAIR PERFORATED VENTRICLE EVACUATION LEFT HEMOTHORAX;  Surgeon: Rexene Alberts, MD;  Location: [facility];  Service: Thoracic;  Laterality: N/A;   RE-EXCISION OF BREAST LUMPECTOMY Right 05/15/2017   Procedure: RE-EXCISION OF BREAST LUMPECTOMY;  Surgeon: [name], MD;  Location: [facility];  Service: General;  Laterality: Right;   RIGHT/LEFT HEART CATH AND CORONARY ANGIOGRAPHY N/A 06/21/2020   Procedure: RIGHT/LEFT HEART CATH AND CORONARY ANGIOGRAPHY;  Surgeon: Nelva Bush, MD;  Location: [facility];  Service: Cardiovascular;  Laterality: N/A;   ROBOTIC ASSISTED SALPINGO OOPHERECTOMY Bilateral 08/29/2019   Procedure: XI ROBOTIC ASSISTED SALPINGO OOPHORECTOMY;  Surgeon: [name], MD;  Location: [facility];  Service: Gynecology;  Laterality: Bilateral;   Septoplasty with turbinate reduction     TEE WITHOUT CARDIOVERSION N/A 10/02/2020   Procedure: TRANSESOPHAGEAL ECHOCARDIOGRAM (TEE);  Surgeon: Rexene Alberts, MD;  [facility];  Service: Thoracic;  Laterality: N/A;   [place] Right 12/20/2021   Procedure: RIGHT TOTAL HIP ARTHROPLASTY ANTERIOR APPROACH;  Surgeon: [name], MD;  Location: [facility];  Service: Orthopedics;  Laterality: Right;   VEIN SURGERY     Removal Right Leg   WISDOM TOOTH EXTRACTION      Social History   Tobacco Use   Smoking status: Former    Packs/day: 3.00    Years: 15.00    Total pack years: 45.00    Types: Cigarettes    Quit date: 09/14/1993    Years since quitting: 28.3   Smokeless tobacco: Never   Tobacco comments:    Quit 26 yrs ago  Vaping Use   Vaping Use: Never used  Substance Use Topics   Alcohol use: Yes    Alcohol/week: 1.0 - 2.0 standard drink of alcohol    Types: 1 - 2 Glasses of wine per week    Comment: Drinks a couple every 2-3 months   Drug use: No    Family History  Problem Relation Age of Onset   Arthritis Mother 33       Deceased   Breast cancer Mother    Lung cancer Mother    Hyperlipidemia Mother     Heart disease Mother    Hypertension Mother    Diabetes Mother    Crohn's disease Mother    Diabetes Maternal Grandfather    Heart disease Maternal Grandfather    Heart attack Maternal Grandfather    Leukemia Maternal Grandfather    Colitis Sister    Leukemia Cousin 3       mat cousin   Lung cancer Father    Brain cancer Father     Allergies  Allergen Reactions   Losartan Potassium Shortness Of Breath   Ace Inhibitors Cough   Aspirin     Uncoated Aspirin upsets stomach   Ceclor [Cefaclor] Hives    Has tolerated Ancef in 2022   Oxycodone Hives   Sulfa Antibiotics Hives   Lisinopril Cough    Medication list has been reviewed and updated.  Current Outpatient Medications on File Prior to Visit  Medication Sig Dispense Refill   acetaminophen (TYLENOL) 500 MG tablet Take 2 tablets (1,000 mg total) by mouth every 6 (six) hours as needed for moderate pain or mild pain. 30 tablet 0   acyclovir (ZOVIRAX) 400 MG tablet Take 1 tablet (400 mg total) by mouth 3 (three) times daily as needed. 270 tablet 1   Adalimumab 80 MG/0.8ML PNKT Inject 80 mg into the skin every 14 (fourteen) days.     albuterol (VENTOLIN HFA) 108 (90 Base) MCG/ACT inhaler Inhale 2 puffs into the lungs every 6 (six) hours as needed for shortness of breath.     ascorbic acid (VITAMIN C) 1000 MG tablet Take 1,000 mg by mouth daily.     aspirin EC 81 MG tablet Take 1 tablet (81 mg total) by mouth 2 (two) times daily. To prevent blood clots after surgery 60 tablet 0   Azelastine HCl 0.15 % SOLN Place 2 sprays into both nostrils 2 (two) times daily.     carvedilol (COREG) 12.5 MG tablet Take 1 tablet (12.5 mg total) by mouth 2 (  two) times daily. 180 tablet 3   cetirizine (ZYRTEC) 10 MG tablet Take 10 mg by mouth daily.     Cholecalciferol (VITAMIN D-3) 125 MCG (5000 UT) TABS Take 1 tablet by mouth once a week.     ciprofloxacin (CIPRO) 500 MG tablet Take 1 tablet (500 mg total) by mouth 2 (two) times daily. 14 tablet 0    dapagliflozin propanediol (FARXIGA) 10 MG TABS tablet Take 1 tablet (10 mg total) by mouth daily before breakfast. 30 tablet 11   diphenoxylate-atropine (LOMOTIL) 2.5-0.025 MG tablet Take 2 tablets by mouth 4 (four) times daily as needed for diarrhea or loose stools.     docusate sodium (COLACE) 100 MG capsule Take 1 capsule (100 mg total) by mouth 2 (two) times daily as needed for up to 10 days (for constipation prevention). 20 capsule 0   EPINEPHrine 0.3 mg/0.3 mL IJ SOAJ injection Inject 0.3 mg into the muscle as needed for anaphylaxis.     exemestane (AROMASIN) 25 MG tablet TAKE 1 TABLET (25 MG TOTAL) BY MOUTH DAILY AFTER BREAKFAST. 90 tablet 3   Hyoscyamine Sulfate 0.375 MG TBCR Take 0.375 mg by mouth 2 (two) times daily as needed (stomach cramping).     ipratropium (ATROVENT) 0.06 % nasal spray Place 2 sprays into both nostrils 4 (four) times daily. Use as needed 15 mL 12   lansoprazole (PREVACID SOLUTAB) 15 MG disintegrating tablet Take 15 mg by mouth 2 (two) times daily before a meal.     levothyroxine (SYNTHROID) 75 MCG tablet Take 1 tablet (75 mcg total) by mouth daily before breakfast. 90 tablet 2   MAGNESIUM PO Take 250 mg by mouth daily.     Menthol, Topical Analgesic, (BIOFREEZE COLORLESS) 4 % GEL Apply 1 application topically daily as needed (Hip pain).     mesalamine (LIALDA) 1.2 g EC tablet Take 2.4 g by mouth in the morning and at bedtime.      metFORMIN (GLUCOPHAGE) 500 MG tablet TAKE 1 TABLET BY MOUTH 2 TIMES DAILY WITH A MEAL. 180 tablet 3   methocarbamol (ROBAXIN-750) 750 MG tablet Take 1 tablet (750 mg total) by mouth every 8 (eight) hours as needed for muscle spasms. 20 tablet 0   montelukast (SINGULAIR) 10 MG tablet Take 10 mg by mouth at bedtime.      Omega 3 1000 MG CAPS Take 2,000 mg by mouth every other day.     ondansetron (ZOFRAN-ODT) 4 MG disintegrating tablet Take 1 tablet (4 mg total) by mouth 2 (two) times daily as needed for nausea or vomiting. 10 tablet 0    Oxycodone HCl 10 MG TABS Take 0.5-1 tablets (5-10 mg total) by mouth every 6 (six) hours as needed (for severe pain after surgery). 35 tablet 0   phenazopyridine (PYRIDIUM) 200 MG tablet Take 1 tablet (200 mg total) by mouth 3 (three) times daily as needed for pain. 10 tablet 0   rosuvastatin (CRESTOR) 20 MG tablet TAKE 1 TABLET BY MOUTH EVERY DAY 90 tablet 1   sacubitril-valsartan (ENTRESTO) 97-103 MG Take 1 tablet by mouth 2 (two) times daily. 180 tablet 1   spironolactone (ALDACTONE) 25 MG tablet Take 1 tablet (25 mg total) by mouth daily. 90 tablet 3   SYMBICORT 160-4.5 MCG/ACT inhaler INHALE 2 PUFFS INTO THE LUNGS TWICE A DAY 30.6 each 1   triamcinolone (NASACORT) 55 MCG/ACT AERO nasal inhaler Place 2 sprays into the nose daily.     venlafaxine XR (EFFEXOR-XR) 150 MG 24 hr capsule TAKE  1 CAPSULE BY MOUTH DAILY WITH BREAKFAST. 90 capsule 1   zinc gluconate 50 MG tablet Take 50 mg by mouth daily.     zolpidem (AMBIEN) 5 MG tablet TAKE 1 TABLET BY MOUTH EVERY DAY AT BEDTIME AS NEEDED FOR SLEEP 30 tablet 3   No current facility-administered medications on file prior to visit.    Review of Systems:  As per HPI- otherwise negative.   Physical Examination: There were no vitals filed for this visit. There were no vitals filed for this visit. There is no height or weight on file to calculate BMI. Ideal Body Weight:    She notes her temp  97, ox sat also about 97% Pt observed via mychart monitor- she looks well, no distress noted   Assessment and Plan: Bronchitis - Plan: doxycycline (VIBRAMYCIN) 100 MG capsule  Virtual visit today for concern of bronchitis Will treat again with doxy as this is a safe option given her heart concerns She will let me know if not getting better in the next few days-Sooner if worse.    Signed Lamar Blinks, MD

## 2021-12-28 ENCOUNTER — Telehealth: Payer: No Typology Code available for payment source | Admitting: Family Medicine

## 2021-12-28 DIAGNOSIS — J40 Bronchitis, not specified as acute or chronic: Secondary | ICD-10-CM | POA: Diagnosis not present

## 2021-12-28 MED ORDER — DOXYCYCLINE HYCLATE 100 MG PO CAPS: 100.0000 mg | ORAL_CAPSULE | Freq: Two times a day (BID) | ORAL | 0 refills | Status: DC

## 2022-01-03 ENCOUNTER — Encounter: Payer: Self-pay | Admitting: Family Medicine

## 2022-01-03 ENCOUNTER — Other Ambulatory Visit: Payer: Self-pay | Admitting: Family Medicine

## 2022-01-03 DIAGNOSIS — J209 Acute bronchitis, unspecified: Secondary | ICD-10-CM

## 2022-01-03 MED ORDER — PREDNISONE 20 MG PO TABS: ORAL_TABLET | ORAL | 0 refills | Status: DC

## 2022-01-05 ENCOUNTER — Encounter: Payer: Self-pay | Admitting: Family Medicine

## 2022-01-05 ENCOUNTER — Ambulatory Visit (HOSPITAL_BASED_OUTPATIENT_CLINIC_OR_DEPARTMENT_OTHER)
Admission: RE | Admit: 2022-01-05 | Discharge: 2022-01-05 | Disposition: A | Discharge status: Home / Self Care | Payer: No Typology Code available for payment source | Source: Ambulatory Visit | Attending: Family Medicine | Admitting: Family Medicine

## 2022-01-05 DIAGNOSIS — J209 Acute bronchitis, unspecified: Secondary | ICD-10-CM | POA: Diagnosis present

## 2022-01-05 DIAGNOSIS — J440 Chronic obstructive pulmonary disease with acute lower respiratory infection: Secondary | ICD-10-CM | POA: Insufficient documentation

## 2022-01-18 ENCOUNTER — Encounter: Payer: Self-pay | Admitting: Hematology

## 2022-01-18 ENCOUNTER — Inpatient Hospital Stay: Payer: No Typology Code available for payment source | Admitting: Hematology

## 2022-01-18 ENCOUNTER — Inpatient Hospital Stay: Payer: No Typology Code available for payment source | Attending: Hematology

## 2022-01-18 VITALS — BP 116/64 | HR 79 | Temp 98.2°F | Resp 16 | Ht 63.5 in | Wt 160.4 lb

## 2022-01-18 DIAGNOSIS — C50411 Malignant neoplasm of upper-outer quadrant of right female breast: Secondary | ICD-10-CM | POA: Diagnosis present

## 2022-01-18 DIAGNOSIS — Z79811 Long term (current) use of aromatase inhibitors: Secondary | ICD-10-CM | POA: Insufficient documentation

## 2022-01-18 DIAGNOSIS — I43 Cardiomyopathy in diseases classified elsewhere: Secondary | ICD-10-CM | POA: Insufficient documentation

## 2022-01-18 DIAGNOSIS — Z170 Estrogen receptor positive status [ER+]: Secondary | ICD-10-CM | POA: Diagnosis not present

## 2022-01-18 DIAGNOSIS — I119 Hypertensive heart disease without heart failure: Secondary | ICD-10-CM | POA: Insufficient documentation

## 2022-01-18 DIAGNOSIS — I509 Heart failure, unspecified: Secondary | ICD-10-CM | POA: Diagnosis not present

## 2022-01-18 LAB — CMP (CANCER CENTER ONLY)
ALT: 12 U/L (ref 0–44)
AST: 13 U/L — ABNORMAL LOW (ref 15–41)
Albumin: 4.5 g/dL (ref 3.5–5.0)
Alkaline Phosphatase: 86 U/L (ref 38–126)
Anion gap: 5 (ref 5–15)
BUN: 7 mg/dL (ref 6–20)
CO2: 28 mmol/L (ref 22–32)
Calcium: 9.3 mg/dL (ref 8.9–10.3)
Chloride: 99 mmol/L (ref 98–111)
Creatinine: 0.7 mg/dL (ref 0.44–1.00)
GFR, Estimated: >60 mL/min (ref >60)
Glucose, Bld: 140 mg/dL — ABNORMAL HIGH (ref 70–99)
Potassium: 5.3 mmol/L — ABNORMAL HIGH (ref 3.5–5.1)
Sodium: 132 mmol/L — ABNORMAL LOW (ref 135–145)
Total Bilirubin: 0.7 mg/dL (ref 0.3–1.2)
Total Protein: 6.5 g/dL (ref 6.5–8.1)

## 2022-01-18 LAB — CBC WITH DIFFERENTIAL (CANCER CENTER ONLY)
Abs Immature Granulocytes: 0.06 10*3/uL (ref 0.00–0.07)
Basophils Absolute: 0.1 10*3/uL (ref 0.0–0.1)
Basophils Relative: 1 %
Eosinophils Absolute: 0.6 10*3/uL — ABNORMAL HIGH (ref 0.0–0.5)
Eosinophils Relative: 8 %
HCT: 37.8 % (ref 36.0–46.0)
Hemoglobin: 12.4 g/dL (ref 12.0–15.0)
Immature Granulocytes: 1 %
Lymphocytes Relative: 33 %
Lymphs Abs: 2.4 10*3/uL (ref 0.7–4.0)
MCH: 29.3 pg (ref 26.0–34.0)
MCHC: 32.8 g/dL (ref 30.0–36.0)
MCV: 89.4 fL (ref 80.0–100.0)
Monocytes Absolute: 1 10*3/uL (ref 0.1–1.0)
Monocytes Relative: 14 %
Neutro Abs: 3.1 10*3/uL (ref 1.7–7.7)
Neutrophils Relative %: 43 %
Platelet Count: 397 10*3/uL (ref 150–400)
RBC: 4.23 MIL/uL (ref 3.87–5.11)
RDW: 13 % (ref 11.5–15.5)
WBC Count: 7.3 10*3/uL (ref 4.0–10.5)
nRBC: 0 % (ref 0.0–0.2)

## 2022-01-18 NOTE — Progress Notes (Signed)
[facility]   [phone] [phone]   Clinic Follow up Note   Patient Care Team: Copland, Gay Filler, MD as PCP - General (Family Medicine) Park Liter, MD as PCP - Cardiology (Cardiology) [name], MD as Referring Physician (Obstetrics and Gynecology) [name], MD as Consulting Physician (Gastroenterology) [name], MD as Referring Physician (Allergy) [name], MD as Consulting Physician (General Surgery) [name], MD as Consulting Physician (Radiation Oncology) [name], MD as Consulting Physician (Hematology) Delice Bison Charlestine Massed, NP as Nurse Practitioner (Hematology and Oncology) [name], MD as Consulting Physician (Gynecologic Oncology) Park Liter, MD as Consulting Physician (Cardiology)  Date of Service:  01/18/2022  CHIEF COMPLAINT: f/u of right breast cancer  CURRENT THERAPY:  Exemestane, started 09/2017   ASSESSMENT & PLAN:  Erin Fields is a 56 y.o. post-BSO female with   1. Malignant neoplasm of upper-outer quadrant of right breast, invasive lobular carcinoma, stage IA, pT2N0M0, ER+/PR+, HER2-, Grade II -diagnosed on 03/16/17. She was treated with right lumpectomy, re-excision surgery and adjuvant radiation. Oncotype RS of 3, low risk. -I started her on antiestrogen therapy with Letrozole in 08/2017. Due to poor tolerance with joint pain, this was changed to Exemestane in 09/2017. She is tolerating better.  -most recent mammogram on 12/22/20 was negative. Repeat scheduled for 01/20/22. -She held exemestane 4/29 - 08/30/20 due to concern for heart issues, restarted per okay from cardiologist. -we discussed the option of switching to tamoxifen given her joint pain and subsequent hip replacement. Given concern for blood clots and the fact that she will complete 5 years of antiestrogen therapy in 08/2022 anyway, she will decided to continue exemestane for now. -she is clinically doing  well. Labs reviewed, overall WNL. Physical exam was unremarkable. There is no clinical concern for recurrence. -lab and f/u with NP Lacie in 6 months   2. Bone Health  -Her 11/03/19 DEXA was normal (T-score -0.6 at [facility]). Mostly stable.  -Will continue to monitor on AI as this can weaken her bone. Next scheduled with mammogram on 01/20/22.  3. Severe non-ischemia CHF -diagnosed with CHF in 04/2020 (non-ischemic cardiomyopathy) with unknown etiology. Her 04/22/20 ECHO showed EF 25%. She required cardiac cath on 06/21/20.  -she underwent ICD placement on 10/01/20 but subsequently developed ventricle perforation, requiring emergent surgery the next day. -followed by Dr Agustin Cree.     PLAN:  -continue exemestane -lab and f/u with NP Lacie in 6 months   No problem-specific Assessment & Plan notes found for this encounter.   SUMMARY OF ONCOLOGIC HISTORY: Oncology History Overview Note  Cancer Staging Malignant neoplasm of upper-outer quadrant of right breast in female, estrogen receptor positive ([place]) Staging form: Breast, AJCC 8th Edition - Clinical stage from 03/16/2017: Stage IA (cT1b, cN0, cM0, G2, ER: Positive, PR: Positive, HER2: Negative) - Signed by [name], MD on 03/28/2017 - Pathologic stage from 05/15/2017: Stage IA (pT2, pN0, cM0, G2, ER+, PR+, HER2-, Oncotype DX score: 3) - Signed by [name], MD on 08/06/2017     Malignant neoplasm of upper-outer quadrant of right breast in female, estrogen receptor positive ([place])  03/15/2017 Mammogram   Diagnostic Mammogram Right Breast  IMPRESSION: Palpable abnormality corresponds to distortion and focal mass associated acoustic shadowing measuring 0.8x0.9x0.9cm in the 930 o'clock location of the right breast 7 cm from the nipple.    RECOMMENDATION: Ultrasound-guided core biopsy is recommended and scheduled for the patient.   03/16/2017 Initial Biopsy   Biopsy Results  Diagnosis 03/16/17 Breast, right, needle core biopsy, 9:30  o'clock, ribbon clip placed - INVASIVE MAMMARY CARCINOMA. - SEE COMMENT.    03/16/2017 Receptors her2   Estrogen Receptor: 95%, POSITIVE, STRONG STAINING INTENSITY Progesterone Receptor: 95%, POSITIVE, STRONG STAINING INTENSITY Proliferation Marker Ki67: 3% HER2: NEGATIVE   03/20/2017 Initial Diagnosis   Malignant neoplasm of upper-outer quadrant of right breast in female, estrogen receptor positive ([place])   04/24/2017 Surgery   Right Breast Lumpectomy with Sentinel Node Biopsy with Dr. [name]    04/24/2017 Pathology Results   Diagnosis 04/24/17 1. Breast, lumpectomy, Right w/seed - INVASIVE LOBULAR CARCINOMA, GRADE 2, SPANNING 2.2 CM. - LOBULAR NEOPLASIA (ATYPICAL LOBULAR HYPERPLASIA). - ANTERIOR SUPERIOR MARGIN IS BROADLY POSITIVE. - BIOPSY SITE. - SEE ONCOLOGY TABLE. 2. Breast, excision, Right additional Medial Margin - INVASIVE LOBULAR CARCINOMA. - LOBULAR CARCINOMA IN SITU. - INVASIVE CARCINOMA IS 0.3 CM FROM NEW MEDIAL MARGIN. 3. Lymph node, sentinel, biopsy, Right Axillary #1 - ISOLATED TUMOR CELLS IN ONE OF ONE LYMPH NODES (0/1). 4. Lymph node, sentinel, biopsy, Right Axillary #2 - ONE OF ONE LYMPH NODES NEGATIVE FOR CARCINOMA (0/1). 5. Lymph node, sentinel, biopsy, Right Axillary #3 - ONE OF ONE LYMPH NODES NEGATIVE FOR CARCINOMA (0/1).   04/24/2017 Oncotype testing   Her Oncotyple recurrence score was 3, her distant recurrence score with Tamoxifen was 3% and her adjuvant chemotherapy benefit was <1%.    05/15/2017 Surgery   Re-excision of Right Breast Lumpectomy with Dr. [name]    05/15/2017 Pathology Results   Diagnosis 05/15/17 Breast, excision, Right additional anterior margin - INVASIVE LOBULAR CARCINOMA. - ATYPICAL LOBULAR HYPERPLASIA. - THE NEW ANTERIOR SURGICAL RESECTION MARGIN IS NEGATIVE FOR CARCINOMA.   05/15/2017 Cancer Staging   Staging form: Breast, AJCC 8th Edition - Pathologic stage from 05/15/2017: Stage IA (pT2, pN0, cM0, G2, ER+, PR+, HER2-, Oncotype DX  score: 3) - Signed by [name], MD on 08/06/2017   06/26/2017 -  Radiation Therapy   Adjuvant RT with Dr. [name]   07/12/2017 Genetic Testing   Negative genetic testing on the multi-cancer panel.  The Multi-Gene Panel offered by [name] includes sequencing and/or deletion duplication testing of the following 83 genes: ALK, APC, ATM, AXIN2,BAP1,  BARD1, BLM, BMPR1A, BRCA1, BRCA2, BRIP1, CASR, CDC73, CDH1, CDK4, CDKN1B, CDKN1C, CDKN2A (p14ARF), CDKN2A (p16INK4a), CEBPA, CHEK2, CTNNA1, DICER1, DIS3L2, EGFR (c.2369C>T, p.Thr790Met variant only), EPCAM (Deletion/duplication testing only), FH, FLCN, GATA2, GPC3, GREM1 (Promoter region deletion/duplication testing only), HOXB13 (c.251G>A, p.Gly84Glu), HRAS, KIT, MAX, MEN1, MET, MITF (c.952G>A, p.Glu318Lys variant only), MLH1, MSH2, MSH3, MSH6, MUTYH, NBN, NF1, NF2, NTHL1, PALB2, PDGFRA, PHOX2B, PMS2, POLD1, POLE, POT1, PRKAR1A, PTCH1, PTEN, RAD50, RAD51C, RAD51D, RB1, RECQL4, RET, RUNX1, SDHAF2, SDHA (sequence changes only), SDHB, SDHC, SDHD, SMAD4, SMARCA4, SMARCB1, SMARCE1, STK11, SUFU, TERT, TERT, TMEM127, TP53, TSC1, TSC2, VHL, WRN and WT1.  The report date is July 12, 2017.    08/13/2017 Imaging   08/13/2017 DEXA ASSESSMENT: The BMD measured at [facility] L1-L4 is 1.120 g/cm2 with a T-score of -0.5. This patient is considered normal according to [facility]) criteria.    08/2017 -  Anti-estrogen oral therapy   Letrozole daily, changed to Exemestane due to poor tolerance of Letrozole   01/16/2018 Imaging   01/16/2018 CT Chest IMPRESSION: Stable 2 mm RIGHT upper lobe nodule.   Post radiation therapy changes at the anterolateral RIGHT upper lobe.   Post RIGHT breast surgery and RIGHT axillary node dissection.   No new abnormalities.   Aortic Atherosclerosis (ICD10-I70.0).  08/09/2018 Mammogram   IMPRESSION: 1. No mammographic evidence of malignancy involving either breast. 2. Expected post lumpectomy and post radiation  changes involving the RIGHT breast.      INTERVAL HISTORY:  Erin Fields is here for a follow up of breast cancer. She was last seen by NP Lacie on 07/20/21. She presents to the clinic alone. She tells me she had her hip replaced about a month ago. She notes she is undergoing PT at home and is walking with a cane.   All other systems were reviewed with the patient and are negative.  MEDICAL HISTORY:  Past Medical History:  Diagnosis Date   Allergic rhinitis due to animal (cat) (dog) hair and dander 04/27/2020   Allergic rhinitis due to pollen 04/27/2020   Anxiety    Anxiety and depression 01/20/2018   Arthritis    Asthma    Asthma, persistent controlled 10/05/2018   B12 deficiency 11/27/2019   Borderline diabetes 12/17/2015   Breast cancer ([place])    Cancer ([place]) 2019   right breast cancer   CERVICAL CANCER, HX OF 12/06/2006   Qualifier: Diagnosis of  By: Arnoldo Morale MD, John E    Chronic sinusitis 01/26/2015   Chronic venous insufficiency 09/02/2015   Colon polyps    Congestive heart failure ([place]) 04/29/2020   Controlled type 2 diabetes mellitus without complication, without long-term current use of insulin ([place]) 05/28/2017   Coronary artery disease 25% of RCA, 20% intermediate branch, 40% diagonal branch based on cardiac cath from 2022 07/22/2020   Crohn disease ([place]) 11/29/2016   Depression    Diabetes mellitus without complication ([place])    type 2   Dilated cardiomyopathy ([place]) ejection fraction 25% by echocardiogram from January 2022 04/29/2020   Dyspnea 09/15/2014   Elevated triglycerides with high cholesterol 11/26/2013   Environmental allergies    Eustachian tube dysfunction 12/04/2014   Ex-smoker 11/18/2015   Family history of breast cancer    Genetic testing 07/16/2017   Negative genetic testing on the multi-cancer panel.  The Multi-Gene Panel offered by [name] includes sequencing and/or deletion duplication testing of the following 83 genes: ALK, APC, ATM,  AXIN2,BAP1,  BARD1, BLM, BMPR1A, BRCA1, BRCA2, BRIP1, CASR, CDC73, CDH1, CDK4, CDKN1B, CDKN1C, CDKN2A (p14ARF), CDKN2A (p16INK4a), CEBPA, CHEK2, CTNNA1, DICER1, DIS3L2, EGFR (c.2369C>T, p.Thr790Met variant onl   GERD (gastroesophageal reflux disease)    HEADACHE 12/06/2006   Qualifier: Diagnosis of  By: Arnoldo Morale MD, John E    Hemothorax on left 10/02/2020   History of adenomatous polyp of colon 11/19/2017   Hyperlipidemia    Hypertension    Hypothyroidism    IDA (iron deficiency anemia) 11/06/2019   Impaired fasting glucose 12/12/2017   Insomnia 08/20/2013   Irritable bowel syndrome 11/29/2006   Qualifier: Diagnosis of  By: Arnoldo Morale MD, John E    Left sided abdominal pain 12/13/2017   Liver hemangioma 08/24/2014   Malignant neoplasm of upper-outer quadrant of right breast in female, estrogen receptor positive ([place]) 03/20/2017   Migraines    Mild persistent asthma, uncomplicated 41/74/0814   Nasal polyp, unspecified 01/10/2018   Oral herpes 04/20/2013   Other allergic rhinitis 09/15/2014   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 10/02/2020   Personal history of radiation therapy 2019   36 radiation tx   PONV (postoperative nausea and vomiting)    scopolamine patch helps   Recurrent infections 10/05/2018   Right upper lobe pulmonary nodule 01/10/2018   Symptomatic varicose veins, right 09/02/2015   Ulcerative colitis ([place])  Varicose veins     SURGICAL HISTORY: Past Surgical History:  Procedure Laterality Date   BREAST BIOPSY Right 02/2017   BREAST BIOPSY Right 11/07/2019   BREAST LUMPECTOMY Right 04/24/2017   BREAST LUMPECTOMY WITH RADIOACTIVE SEED AND SENTINEL LYMPH NODE BIOPSY Right 04/24/2017   Procedure: RIGHT BREAST LUMPECTOMY WITH RADIOACTIVE SEED AND SENTINEL LYMPH NODE BIOPSY;  Surgeon: [name], MD;  Location: [facility];  Service: General;  Laterality: Right;   CORONARY ARTERY BYPASS GRAFT     crapal tunnel relase Left 05/2018    ENDOMETRIAL ABLATION  2010   Had abnormal uterine bleeding (heavy and frequent), performed in [place], menses stopped completely 2 years after   EXPLORATORY LAPAROTOMY     Had exploratory surgery at the age of 6 for abdominal pain with cyst found on her left ovary, not treated surgically   FOOT SURGERY Right 2012   GENERATOR REMOVAL  10/02/2020   Procedure: GENERATOR REMOVAL;  Surgeon: Rexene Alberts, MD;  Location: [facility];  Service: Thoracic;;   HAND SURGERY Right    ICD IMPLANT N/A 10/01/2020   Procedure: ICD IMPLANT;  Surgeon: Constance Haw, MD;  Location: [facility];  Service: Cardiovascular;  Laterality: N/A;   ICD LEAD REMOVAL N/A 10/02/2020   Procedure: ICD LEAD REMOVAL;  Surgeon: Rexene Alberts, MD;  Location: [facility];  Service: Thoracic;  Laterality: N/A;   MEDIASTERNOTOMY N/A 10/02/2020   Procedure: MEDIAN STERNOTOMY REPAIR PERFORATED VENTRICLE EVACUATION LEFT HEMOTHORAX;  Surgeon: Rexene Alberts, MD;  Location: [facility];  Service: Thoracic;  Laterality: N/A;   RE-EXCISION OF BREAST LUMPECTOMY Right 05/15/2017   Procedure: RE-EXCISION OF BREAST LUMPECTOMY;  Surgeon: [name], MD;  Location: [facility];  Service: General;  Laterality: Right;   RIGHT/LEFT HEART CATH AND CORONARY ANGIOGRAPHY N/A 06/21/2020   Procedure: RIGHT/LEFT HEART CATH AND CORONARY ANGIOGRAPHY;  Surgeon: Nelva Bush, MD;  Location: [facility];  Service: Cardiovascular;  Laterality: N/A;   ROBOTIC ASSISTED SALPINGO OOPHERECTOMY Bilateral 08/29/2019   Procedure: XI ROBOTIC ASSISTED SALPINGO OOPHORECTOMY;  Surgeon: [name], MD;  Location: [facility];  Service: Gynecology;  Laterality: Bilateral;   Septoplasty with turbinate reduction     TEE WITHOUT CARDIOVERSION N/A 10/02/2020   Procedure: TRANSESOPHAGEAL ECHOCARDIOGRAM (TEE);  Surgeon: Rexene Alberts, MD;  Location: [facility] OR;  Service: Thoracic;  Laterality: N/A;   [place] Right 12/20/2021   Procedure: RIGHT TOTAL HIP ARTHROPLASTY ANTERIOR APPROACH;  Surgeon: [name], MD;  Location: [facility];  Service: Orthopedics;  Laterality: Right;   VEIN SURGERY     Removal Right Leg   WISDOM TOOTH EXTRACTION      I have reviewed the social history and family history with the patient and they are unchanged from previous note.  ALLERGIES:  is allergic to losartan potassium, ace inhibitors, aspirin, ceclor [cefaclor], oxycodone, sulfa antibiotics, and lisinopril.  MEDICATIONS:  Current Outpatient Medications  Medication Sig Dispense Refill   acetaminophen (TYLENOL) 500 MG tablet Take 2 tablets (1,000 mg total) by mouth every 6 (six) hours as needed for moderate pain or mild pain. 30 tablet 0   acyclovir (ZOVIRAX) 400 MG tablet Take 1 tablet (400 mg total) by mouth 3 (three) times daily as needed. 270 tablet 1   Adalimumab 80 MG/0.8ML PNKT Inject 80 mg into the skin every 14 (fourteen) days.     albuterol (VENTOLIN HFA) 108 (90 Base) MCG/ACT  inhaler Inhale 2 puffs into the lungs every 6 (six) hours as needed for shortness of breath.     ascorbic acid (VITAMIN C) 1000 MG tablet Take 1,000 mg by mouth daily.     aspirin EC 81 MG tablet Take 1 tablet (81 mg total) by mouth 2 (two) times daily. To prevent blood clots after surgery 60 tablet 0   Azelastine HCl 0.15 % SOLN Place 2 sprays into both nostrils 2 (two) times daily.     carvedilol (COREG) 12.5 MG tablet Take 1 tablet (12.5 mg total) by mouth 2 (two) times daily. 180 tablet 3   cetirizine (ZYRTEC) 10 MG tablet Take 10 mg by mouth daily.     Cholecalciferol (VITAMIN D-3) 125 MCG (5000 UT) TABS Take 1 tablet by mouth once a week.     dapagliflozin propanediol (FARXIGA) 10 MG TABS tablet Take 1 tablet (10 mg total) by mouth daily before breakfast. 30 tablet 11   diphenoxylate-atropine (LOMOTIL) 2.5-0.025 MG tablet Take 2 tablets by mouth 4 (four) times daily as needed for diarrhea or loose stools.      doxycycline (VIBRAMYCIN) 100 MG capsule Take 1 capsule (100 mg total) by mouth 2 (two) times daily. 20 capsule 0   EPINEPHrine 0.3 mg/0.3 mL IJ SOAJ injection Inject 0.3 mg into the muscle as needed for anaphylaxis.     exemestane (AROMASIN) 25 MG tablet TAKE 1 TABLET (25 MG TOTAL) BY MOUTH DAILY AFTER BREAKFAST. 90 tablet 3   Hyoscyamine Sulfate 0.375 MG TBCR Take 0.375 mg by mouth 2 (two) times daily as needed (stomach cramping).     ipratropium (ATROVENT) 0.06 % nasal spray Place 2 sprays into both nostrils 4 (four) times daily. Use as needed 15 mL 12   lansoprazole (PREVACID SOLUTAB) 15 MG disintegrating tablet Take 15 mg by mouth 2 (two) times daily before a meal.     levothyroxine (SYNTHROID) 75 MCG tablet Take 1 tablet (75 mcg total) by mouth daily before breakfast. 90 tablet 2   MAGNESIUM PO Take 250 mg by mouth daily.     Menthol, Topical Analgesic, (BIOFREEZE COLORLESS) 4 % GEL Apply 1 application topically daily as needed (Hip pain).     mesalamine (LIALDA) 1.2 g EC tablet Take 2.4 g by mouth in the morning and at bedtime.      metFORMIN (GLUCOPHAGE) 500 MG tablet TAKE 1 TABLET BY MOUTH 2 TIMES DAILY WITH A MEAL. 180 tablet 3   methocarbamol (ROBAXIN-750) 750 MG tablet Take 1 tablet (750 mg total) by mouth every 8 (eight) hours as needed for muscle spasms. 20 tablet 0   montelukast (SINGULAIR) 10 MG tablet Take 10 mg by mouth at bedtime.      Omega 3 1000 MG CAPS Take 2,000 mg by mouth every other day.     ondansetron (ZOFRAN-ODT) 4 MG disintegrating tablet Take 1 tablet (4 mg total) by mouth 2 (two) times daily as needed for nausea or vomiting. 10 tablet 0   Oxycodone HCl 10 MG TABS Take 0.5-1 tablets (5-10 mg total) by mouth every 6 (six) hours as needed (for severe pain after surgery). 35 tablet 0   phenazopyridine (PYRIDIUM) 200 MG tablet Take 1 tablet (200 mg total) by mouth 3 (three) times daily as needed for pain. 10 tablet 0   predniSONE (DELTASONE) 20 MG tablet Take 40 mg by  mouth daily for 3 days, then 20 mg by mouth daily for 3 days 9 tablet 0   rosuvastatin (CRESTOR) 20 MG tablet  TAKE 1 TABLET BY MOUTH EVERY DAY 90 tablet 1   sacubitril-valsartan (ENTRESTO) 97-103 MG Take 1 tablet by mouth 2 (two) times daily. 180 tablet 1   spironolactone (ALDACTONE) 25 MG tablet Take 1 tablet (25 mg total) by mouth daily. 90 tablet 3   SYMBICORT 160-4.5 MCG/ACT inhaler INHALE 2 PUFFS INTO THE LUNGS TWICE A DAY 30.6 each 1   triamcinolone (NASACORT) 55 MCG/ACT AERO nasal inhaler Place 2 sprays into the nose daily.     venlafaxine XR (EFFEXOR-XR) 150 MG 24 hr capsule TAKE 1 CAPSULE BY MOUTH DAILY WITH BREAKFAST. 90 capsule 1   zinc gluconate 50 MG tablet Take 50 mg by mouth daily.     zolpidem (AMBIEN) 5 MG tablet TAKE 1 TABLET BY MOUTH EVERY DAY AT BEDTIME AS NEEDED FOR SLEEP 30 tablet 3   No current facility-administered medications for this visit.    PHYSICAL EXAMINATION: ECOG PERFORMANCE STATUS: 2 - Symptomatic, <50% confined to bed  Vitals:   01/18/22 1120  BP: 116/64  Pulse: 79  Resp: 16  Temp: 98.2 F (36.8 C)  SpO2: 99%   Wt Readings from Last 3 Encounters:  01/18/22 160 lb 6.4 oz (72.8 kg)  12/20/21 160 lb 12.8 oz (72.9 kg)  12/13/21 160 lb 12.8 oz (72.9 kg)     GENERAL:alert, no distress and comfortable SKIN: skin color, texture, turgor are normal, no rashes or significant lesions EYES: normal, Conjunctiva are pink and non-injected, sclera clear  NECK: supple, thyroid normal size, non-tender, without nodularity LYMPH:  no palpable lymphadenopathy in the cervical, axillary LUNGS: clear to auscultation and percussion with normal breathing effort HEART: regular rate & rhythm and no murmurs and no lower extremity edema ABDOMEN:abdomen soft, non-tender and normal bowel sounds Musculoskeletal:no cyanosis of digits and no clubbing  NEURO: alert & oriented x 3 with fluent speech, no focal motor/sensory deficits BREAST: mild scar tissue. No palpable mass,  nodules or adenopathy bilaterally. Breast exam benign.   LABORATORY DATA:  I have reviewed the data as listed    Latest Ref Rng & Units 01/18/2022   11:09 AM 12/21/2021    2:37 PM 12/20/2021    2:20 PM  CBC  WBC 4.0 - 10.5 K/uL 7.3  18.2  18.4   Hemoglobin 12.0 - 15.0 g/dL 12.4  10.5  12.0   Hematocrit 36.0 - 46.0 % 37.8  30.9  36.1   Platelets 150 - 400 K/uL 397  364  368         Latest Ref Rng & Units 01/18/2022   11:09 AM 12/20/2021    2:20 PM 12/07/2021   11:00 AM  CMP  Glucose 70 - 99 mg/dL 140   163   BUN 6 - 20 mg/dL 7   8   Creatinine 0.44 - 1.00 mg/dL 0.70  0.78  0.80   Sodium 135 - 145 mmol/L 132   131   Potassium 3.5 - 5.1 mmol/L 5.3   4.1   Chloride 98 - 111 mmol/L 99   100   CO2 22 - 32 mmol/L 28   21   Calcium 8.9 - 10.3 mg/dL 9.3   9.3   Total Protein 6.5 - 8.1 g/dL 6.5     Total Bilirubin 0.3 - 1.2 mg/dL 0.7     Alkaline Phos 38 - 126 U/L 86     AST 15 - 41 U/L 13     ALT 0 - 44 U/L 12  RADIOGRAPHIC STUDIES: I have personally reviewed the radiological images as listed and agreed with the findings in the report. No results found.    No orders of the defined types were placed in this encounter.  All questions were answered. The patient knows to call the clinic with any problems, questions or concerns. No barriers to learning was detected. The total time spent in the appointment was 30 minutes.     [name], MD 01/18/2022   I, Wilburn Mylar, am acting as scribe for [name], MD.   I have reviewed the above documentation for accuracy and completeness, and I agree with the above.

## 2022-01-20 ENCOUNTER — Ambulatory Visit
Admission: RE | Admit: 2022-01-20 | Discharge: 2022-01-20 | Disposition: A | Discharge status: Home / Self Care | Payer: No Typology Code available for payment source | Source: Ambulatory Visit | Attending: Nurse Practitioner | Admitting: Nurse Practitioner

## 2022-01-20 DIAGNOSIS — Z170 Estrogen receptor positive status [ER+]: Secondary | ICD-10-CM

## 2022-01-24 ENCOUNTER — Other Ambulatory Visit: Payer: Self-pay | Admitting: Family Medicine

## 2022-01-24 ENCOUNTER — Encounter: Payer: Self-pay | Admitting: Nurse Practitioner

## 2022-01-24 DIAGNOSIS — E034 Atrophy of thyroid (acquired): Secondary | ICD-10-CM

## 2022-01-30 ENCOUNTER — Other Ambulatory Visit: Payer: Self-pay | Admitting: Family Medicine

## 2022-01-30 DIAGNOSIS — B001 Herpesviral vesicular dermatitis: Secondary | ICD-10-CM

## 2022-02-01 ENCOUNTER — Telehealth: Payer: Self-pay | Admitting: *Deleted

## 2022-02-01 NOTE — Telephone Encounter (Signed)
   Pre-operative Risk Assessment    Patient Name: Erin Fields  DOB: 1965-06-07 MRN: [id number]      Request for Surgical Clearance    Procedure:   LEFT TOTAL HIP ARTHROPLASTY   Date of Surgery:  Clearance 04/18/22                                 Surgeon:  Edmonia Lynch, MD Surgeon's Group or Practice Name:  [facility] Phone number:  [phone] Fax number:  [phone]  [place]   Type of Clearance Requested:   - Medical  - Pharmacy:  Hold Aspirin NOT INDICATED HOW LONG   Type of Anesthesia:   SPINAL & CHOICE   Additional requests/questions:    Astrid Divine   02/01/2022, 3:59 PM

## 2022-02-02 NOTE — Telephone Encounter (Signed)
   Name: Erin Fields  DOB: [birth year]  MRN: [id number]  Primary Cardiologist: Jenne Campus, MD   Preoperative team, please contact this patient and set up a phone call appointment for further preoperative risk assessment. Please obtain consent and complete medication review. Thank you for your help.  I confirm that guidance regarding antiplatelet and oral anticoagulation therapy has been completed and, if necessary, noted below.  Her aspirin is prescribed by a non cardiology provider.  Recommendations for holding aspirin will need to come from prescribing provider.   Deberah Pelton, NP 02/02/2022, 9:49 AM [place]

## 2022-02-02 NOTE — Telephone Encounter (Signed)
Pt is actually already scheduled to see Dr. Agustin Cree, in person, 03/21/22, and she prefers to get clearance at that time.  Will route to requesting surgeon's office to make them aware clearance will be addressed at that time.

## 2022-02-13 ENCOUNTER — Encounter: Payer: Self-pay | Admitting: Family Medicine

## 2022-02-18 ENCOUNTER — Other Ambulatory Visit: Payer: Self-pay | Admitting: Family Medicine

## 2022-02-18 DIAGNOSIS — E559 Vitamin D deficiency, unspecified: Secondary | ICD-10-CM

## 2022-02-20 ENCOUNTER — Other Ambulatory Visit: Payer: Self-pay | Admitting: Family Medicine

## 2022-02-20 ENCOUNTER — Other Ambulatory Visit: Payer: Self-pay | Admitting: Medical

## 2022-02-20 DIAGNOSIS — E782 Mixed hyperlipidemia: Secondary | ICD-10-CM

## 2022-02-28 ENCOUNTER — Ambulatory Visit
Admission: RE | Admit: 2022-02-28 | Discharge: 2022-02-28 | Disposition: A | Discharge status: Home / Self Care | Payer: No Typology Code available for payment source | Source: Ambulatory Visit | Attending: Internal Medicine | Admitting: Internal Medicine

## 2022-02-28 VITALS — BP 142/94 | HR 79 | Temp 98.7°F | Resp 16

## 2022-02-28 DIAGNOSIS — R300 Dysuria: Secondary | ICD-10-CM | POA: Diagnosis not present

## 2022-02-28 DIAGNOSIS — R81 Glycosuria: Secondary | ICD-10-CM | POA: Diagnosis not present

## 2022-02-28 LAB — POCT URINALYSIS DIP (MANUAL ENTRY)
Bilirubin, UA: NEGATIVE
Blood, UA: NEGATIVE
Glucose, UA: 500 mg/dL — AB
Ketones, POC UA: NEGATIVE mg/dL
Leukocytes, UA: NEGATIVE
Nitrite, UA: NEGATIVE
Protein Ur, POC: NEGATIVE mg/dL
Spec Grav, UA: <=1.005 — AB (ref 1.010–1.025)
Urobilinogen, UA: 0.2 E.U./dL
pH, UA: 5.5 (ref 5.0–8.0)

## 2022-02-28 NOTE — Discharge Instructions (Signed)
Your urinalysis today was not concerning for acute urinary tract infection.  You did have glucose in your urine which is not a normal finding.  Please be sure you monitor your blood sugar and keeping your a.m. blood sugars below 120 and your 2-hour postprandial blood sugars below 180.  If you begin to have symptoms of worsening urinary tract infection such as urgency, hesitancy, burning with urination, abnormal urine odor, please come back and we will repeat urinalysis and treat you if needed.  Thank you for visiting urgent care today.

## 2022-02-28 NOTE — ED Provider Notes (Signed)
[facility]    CSN: [id number] Arrival date & time: 02/28/22  1540    HISTORY   Chief Complaint  Patient presents with   Urinary Retention    Entered by patient   HPI Erin Fields is a pleasant, 56 y.o. female who presents to urgent care today. Pt c/o decreased frequency of urination overnight and throughout the day today.  Patient states she usually pees frequently as well as once or twice in the middle of the night, states she slept all night without going to the bathroom 1 time.  Patient states over the weekend she indulged eating more sweets and savory foods, denies swelling in her hands and feet at this time.  Patient was advised of urine dip findings which included 500 mg/DL of glucose.  Patient denies burning with urination, sensation of incomplete emptying, difficulty initiating urine stream, history of urinary outflow tract obstruction requiring treatment.  The history is provided by the patient.   Past Medical History:  Diagnosis Date   Allergic rhinitis due to animal (cat) (dog) hair and dander 04/27/2020   Allergic rhinitis due to pollen 04/27/2020   Anxiety    Anxiety and depression 01/20/2018   Arthritis    Asthma    Asthma, persistent controlled 10/05/2018   B12 deficiency 11/27/2019   Borderline diabetes 12/17/2015   Breast cancer (HCC)    Cancer (HCC) 2019   right breast cancer   CERVICAL CANCER, HX OF 12/06/2006   Qualifier: Diagnosis of  By: Jenkins MD, John E    Chronic sinusitis 01/26/2015   Chronic venous insufficiency 09/02/2015   Colon polyps    Congestive heart failure (HCC) 04/29/2020   Controlled type 2 diabetes mellitus without complication, without long-term current use of insulin (HCC) 05/28/2017   Coronary artery disease 25% of RCA, 20% intermediate branch, 40% diagonal branch based on cardiac cath from 2022 07/22/2020   Crohn disease (HCC) 11/29/2016   Depression    Diabetes mellitus without complication (HCC)    type 2    Dilated cardiomyopathy (HCC) ejection fraction 25% by echocardiogram from January 2022 04/29/2020   Dyspnea 09/15/2014   Elevated triglycerides with high cholesterol 11/26/2013   Environmental allergies    Eustachian tube dysfunction 12/04/2014   Ex-smoker 11/18/2015   Family history of breast cancer    Genetic testing 07/16/2017   Negative genetic testing on the multi-cancer panel.  The Multi-Gene Panel offered by [name] includes sequencing and/or deletion duplication testing of the following 83 genes: ALK, APC, ATM, AXIN2,BAP1,  BARD1, BLM, BMPR1A, BRCA1, BRCA2, BRIP1, CASR, CDC73, CDH1, CDK4, CDKN1B, CDKN1C, CDKN2A (p14ARF), CDKN2A (p16INK4a), CEBPA, CHEK2, CTNNA1, DICER1, DIS3L2, EGFR (c.2369C>T, p.Thr790Met variant onl   GERD (gastroesophageal reflux disease)    HEADACHE 12/06/2006   Qualifier: Diagnosis of  By: Jenkins MD, John E    Hemothorax on left 10/02/2020   History of adenomatous polyp of colon 11/19/2017   Hyperlipidemia    Hypertension    Hypothyroidism    IDA (iron deficiency anemia) 11/06/2019   Impaired fasting glucose 12/12/2017   Insomnia 08/20/2013   Irritable bowel syndrome 11/29/2006   Qualifier: Diagnosis of  By: Jenkins MD, John E    Left sided abdominal pain 12/13/2017   Liver hemangioma 08/24/2014   Malignant neoplasm of upper-outer quadrant of right breast in female, 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   Migraines    Mild persistent asthma, uncomplicated 04/27/2020   Nasal polyp, unspecified 01/10/2018   Oral herpes 04/20/2013   Other allergic   rhinitis 09/15/2014   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 10/02/2020   Personal history of radiation therapy 2019   36 radiation tx   PONV (postoperative nausea and vomiting)    scopolamine patch helps   Recurrent infections 10/05/2018   Right upper lobe pulmonary nodule 01/10/2018   Symptomatic varicose veins, right 09/02/2015   Ulcerative colitis (HCC)    Varicose veins    Patient  Active Problem List   Diagnosis Date Noted   DJD (degenerative joint disease) 12/20/2021   S/P total right hip arthroplasty 12/20/2021   Breast cancer (HCC) 05/11/2021   Hemothorax on left 10/02/2020   S/P evacuation of hematoma 10/02/2020   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 10/02/2020   Hypovolemic shock (HCC) 10/02/2020   Anxiety    Coronary artery disease 25% of RCA, 20% intermediate branch, 40% diagonal branch based on cardiac cath from 2022 07/22/2020   Dilated cardiomyopathy (HCC) ejection fraction 25% by echocardiogram from January 2022 04/29/2020   Congestive heart failure (HCC) 04/29/2020   Allergic rhinitis due to animal (cat) (dog) hair and dander 04/27/2020   Allergic rhinitis due to pollen 04/27/2020   Mild persistent asthma, uncomplicated 04/27/2020   PONV (postoperative nausea and vomiting)    Migraines    GERD (gastroesophageal reflux disease)    Environmental allergies    Diabetes mellitus without complication (HCC)    Depression    Colon polyps    B12 deficiency 11/27/2019   IDA (iron deficiency anemia) 11/06/2019   Asthma, persistent controlled 10/05/2018   Recurrent infections 10/05/2018   Anxiety and depression 01/20/2018   Right upper lobe pulmonary nodule 01/10/2018   Nasal polyp, unspecified 01/10/2018   Left sided abdominal pain 12/13/2017   Impaired fasting glucose 12/12/2017   History of adenomatous polyp of colon 11/19/2017   Genetic testing 07/16/2017   Family history of breast cancer    Controlled type 2 diabetes mellitus without complication, without long-term current use of insulin (HCC) 05/28/2017   Personal history of radiation therapy 2019   Cancer (HCC) 2019   Malignant neoplasm of upper-outer quadrant of right breast in female, 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   Crohn disease (HCC) 11/29/2016   Borderline diabetes 12/17/2015   Ex-smoker 11/18/2015   Chronic venous insufficiency 09/02/2015   Symptomatic  varicose veins, right 09/02/2015   Chronic sinusitis 01/26/2015   Eustachian tube dysfunction 12/04/2014   Other allergic rhinitis 09/15/2014   Dyspnea 09/15/2014   Liver hemangioma 08/24/2014   Elevated triglycerides with high cholesterol 11/26/2013   Insomnia 08/20/2013   Hypertension 08/20/2013   Ulcerative colitis (HCC) 04/20/2013   Oral herpes 04/20/2013   Hyperlipidemia 12/06/2006   HEADACHE 12/06/2006   CERVICAL CANCER, HX OF 12/06/2006   Hypothyroidism 11/29/2006   Asthma 11/29/2006   IRRITABLE BOWEL SYNDROME 11/29/2006   Past Surgical History:  Procedure Laterality Date   BREAST BIOPSY Right 02/2017   BREAST BIOPSY Right 11/07/2019   BREAST LUMPECTOMY Right 04/24/2017   BREAST LUMPECTOMY WITH RADIOACTIVE SEED AND SENTINEL LYMPH NODE BIOPSY Right 04/24/2017   Procedure: RIGHT BREAST LUMPECTOMY WITH RADIOACTIVE SEED AND SENTINEL LYMPH NODE BIOPSY;  Surgeon: Byerly, Faera, MD;  Location: [facility];  Service: General;  Laterality: Right;   CORONARY ARTERY BYPASS GRAFT     crapal tunnel relase Left 05/2018   ENDOMETRIAL ABLATION  2010   Had abnormal uterine bleeding (heavy and frequent), performed in [place], menses stopped completely 2 years after   EXPLORATORY LAPAROTOMY       Had exploratory surgery at the age of 15 for abdominal pain with cyst found on her left ovary, not treated surgically   FOOT SURGERY Right 2012   GENERATOR REMOVAL  10/02/2020   Procedure: GENERATOR REMOVAL;  Surgeon: Owen, Clarence H, MD;  Location: [facility];  Service: Thoracic;;   HAND SURGERY Right    ICD IMPLANT N/A 10/01/2020   Procedure: ICD IMPLANT;  Surgeon: Camnitz, Will Martin, MD;  Location: [facility];  Service: Cardiovascular;  Laterality: N/A;   ICD LEAD REMOVAL N/A 10/02/2020   Procedure: ICD LEAD REMOVAL;  Surgeon: Owen, Clarence H, MD;  Location: [facility];  Service: Thoracic;  Laterality: N/A;   MEDIASTERNOTOMY N/A 10/02/2020   Procedure: MEDIAN STERNOTOMY REPAIR  PERFORATED VENTRICLE EVACUATION LEFT HEMOTHORAX;  Surgeon: Owen, Clarence H, MD;  Location: [facility];  Service: Thoracic;  Laterality: N/A;   RE-EXCISION OF BREAST LUMPECTOMY Right 05/15/2017   Procedure: RE-EXCISION OF BREAST LUMPECTOMY;  Surgeon: Byerly, Faera, MD;  Location:  SURGERY CENTER;  Service: General;  Laterality: Right;   RIGHT/LEFT HEART CATH AND CORONARY ANGIOGRAPHY N/A 06/21/2020   Procedure: RIGHT/LEFT HEART CATH AND CORONARY ANGIOGRAPHY;  Surgeon: End, Christopher, MD;  [facility]: [facility];  Service: Cardiovascular;  Laterality: N/A;   ROBOTIC ASSISTED SALPINGO OOPHERECTOMY Bilateral 08/29/2019   Procedure: XI ROBOTIC ASSISTED SALPINGO OOPHORECTOMY;  Surgeon: Tucker, Katherine R, MD;  Location: [facility];  Service: Gynecology;  Laterality: Bilateral;   Septoplasty with turbinate reduction     TEE WITHOUT CARDIOVERSION N/A 10/02/2020   Procedure: TRANSESOPHAGEAL ECHOCARDIOGRAM (TEE);  Surgeon: Owen, Clarence H, MD;  Location: [facility];  Service: Thoracic;  Laterality: N/A;   TONSILLECTOMY  1980   TOTAL HIP ARTHROPLASTY Right 12/20/2021   Procedure: RIGHT TOTAL HIP ARTHROPLASTY ANTERIOR APPROACH;  Surgeon: Murphy, Timothy D, MD;  Location: [facility];  Service: Orthopedics;  Laterality: Right;   VEIN SURGERY     Removal Right Leg   WISDOM TOOTH EXTRACTION     OB History     Gravida  3   Para  2   Term      Preterm      AB  1   Living  1      SAB      IAB      Ectopic      Multiple      Live Births             Home Medications    Prior to Admission medications   Medication Sig Start Date End Date Taking? Authorizing Provider  acetaminophen (TYLENOL) 500 MG tablet Take 2 tablets (1,000 mg total) by mouth every 6 (six) hours as needed for moderate pain or mild pain. 12/21/21   Gawne, Meghan M, PA-C  acyclovir (ZOVIRAX) 400 MG tablet TAKE 1 TABLET (400 MG TOTAL) BY MOUTH 3 TIMES A DAY AS NEEDED 01/30/22   Copland, Jessica C, MD   Adalimumab 80 MG/0.8ML PNKT Inject 80 mg into the skin every 14 (fourteen) days. 12/01/19   [provider]  albuterol (VENTOLIN HFA) 108 (90 Base) MCG/ACT inhaler Inhale 2 puffs into the lungs every 6 (six) hours as needed for shortness of breath. 09/22/20   [provider]  ascorbic acid (VITAMIN C) 1000 MG tablet Take 1,000 mg by mouth daily.    [provider]  aspirin EC 81 MG tablet Take 1 tablet (81 mg total) by mouth 2 (two) times daily. To prevent blood clots after surgery   12/21/21   Gawne, Meghan M, PA-C  Azelastine HCl 0.15 % SOLN Place 2 sprays into both nostrils 2 (two) times daily. 07/16/20   [provider]  carvedilol (COREG) 12.5 MG tablet Take 1 tablet (12.5 mg total) by mouth 2 (two) times daily. 04/19/21   McLean, Dalton S, MD  cetirizine (ZYRTEC) 10 MG tablet Take 10 mg by mouth daily.    [provider]  Cholecalciferol (VITAMIN D-3) 125 MCG (5000 UT) TABS Take 1 tablet by mouth once a week.    [provider]  dapagliflozin propanediol (FARXIGA) 10 MG TABS tablet Take 1 tablet (10 mg total) by mouth daily before breakfast. 12/27/21   McLean, Dalton S, MD  diphenoxylate-atropine (LOMOTIL) 2.5-0.025 MG tablet Take 2 tablets by mouth 4 (four) times daily as needed for diarrhea or loose stools.    [provider]  doxycycline (VIBRAMYCIN) 100 MG capsule Take 1 capsule (100 mg total) by mouth 2 (two) times daily. 12/28/21   Copland, Jessica C, MD  EPINEPHrine 0.3 mg/0.3 mL IJ SOAJ injection Inject 0.3 mg into the muscle as needed for anaphylaxis.    [provider]  exemestane (AROMASIN) 25 MG tablet TAKE 1 TABLET (25 MG TOTAL) BY MOUTH DAILY AFTER BREAKFAST. 07/20/21   Burton, Lacie K, NP  Hyoscyamine Sulfate 0.375 MG TBCR Take 0.375 mg by mouth 2 (two) times daily as needed (stomach cramping).    [provider]  ipratropium (ATROVENT) 0.06 % nasal spray Place 2 sprays into both nostrils 4 (four) times daily. Use  as needed 06/29/21   Copland, Jessica C, MD  lansoprazole (PREVACID SOLUTAB) 15 MG disintegrating tablet Take 15 mg by mouth 2 (two) times daily before a meal.    [provider]  levothyroxine (SYNTHROID) 75 MCG tablet TAKE 1 TABLET BY MOUTH DAILY BEFORE BREAKFAST. 01/24/22   Copland, Jessica C, MD  MAGNESIUM PO Take 250 mg by mouth daily.    [provider]  Menthol, Topical Analgesic, (BIOFREEZE COLORLESS) 4 % GEL Apply 1 application topically daily as needed (Hip pain).    [provider]  mesalamine (LIALDA) 1.2 g EC tablet Take 2.4 g by mouth in the morning and at bedtime.     [provider]  metFORMIN (GLUCOPHAGE) 500 MG tablet TAKE 1 TABLET BY MOUTH 2 TIMES DAILY WITH A MEAL. 10/24/21   Copland, Jessica C, MD  methocarbamol (ROBAXIN-750) 750 MG tablet Take 1 tablet (750 mg total) by mouth every 8 (eight) hours as needed for muscle spasms. 12/21/21   Gawne, Meghan M, PA-C  montelukast (SINGULAIR) 10 MG tablet Take 10 mg by mouth at bedtime.     [provider]  Omega 3 1000 MG CAPS Take 2,000 mg by mouth every other day.    [provider]  ondansetron (ZOFRAN-ODT) 4 MG disintegrating tablet Take 1 tablet (4 mg total) by mouth 2 (two) times daily as needed for nausea or vomiting. 12/21/21   Gawne, Meghan M, PA-C  Oxycodone HCl 10 MG TABS Take 0.5-1 tablets (5-10 mg total) by mouth every 6 (six) hours as needed (for severe pain after surgery). 12/21/21   Gawne, Meghan M, PA-C  phenazopyridine (PYRIDIUM) 200 MG tablet Take 1 tablet (200 mg total) by mouth 3 (three) times daily as needed for pain. 12/12/21   Mani, Mario, PA-C  predniSONE (DELTASONE) 20 MG tablet Take 40 mg by mouth daily for 3 days, then 20 mg by mouth daily for 3 days 01/03/22   Copland,   Jessica C, MD  rosuvastatin (CRESTOR) 20 MG tablet Take 1 tablet (20 mg total) by mouth daily. 02/20/22   Copland, Jessica C, MD  sacubitril-valsartan (ENTRESTO) 97-103 MG Take 1 tablet by mouth 2 (two)  times daily. 12/27/21   Krasowski, Robert J, MD  spironolactone (ALDACTONE) 25 MG tablet Take 1 tablet (25 mg total) by mouth daily. 11/01/21   McLean, Dalton S, MD  SYMBICORT 160-4.5 MCG/ACT inhaler INHALE 2 PUFFS INTO THE LUNGS TWICE A DAY 02/20/22   Saguier, Edward, PA-C  triamcinolone (NASACORT) 55 MCG/ACT AERO nasal inhaler Place 2 sprays into the nose daily.    [provider]  venlafaxine XR (EFFEXOR-XR) 150 MG 24 hr capsule TAKE 1 CAPSULE BY MOUTH DAILY WITH BREAKFAST. 10/24/21   Copland, Jessica C, MD  zinc gluconate 50 MG tablet Take 50 mg by mouth daily.    [provider]  zolpidem (AMBIEN) 5 MG tablet TAKE 1 TABLET BY MOUTH EVERY DAY AT BEDTIME AS NEEDED FOR SLEEP 12/07/21   Copland, Jessica C, MD    Family History Family History  Problem Relation Age of Onset   Arthritis Mother 58       Deceased   Breast cancer Mother    Lung cancer Mother    Hyperlipidemia Mother    Heart disease Mother    Hypertension Mother    Diabetes Mother    Crohn's disease Mother    Diabetes Maternal Grandfather    Heart disease Maternal Grandfather    Heart attack Maternal Grandfather    Leukemia Maternal Grandfather    Colitis Sister    Leukemia Cousin 3       mat cousin   Lung cancer Father    Brain cancer Father    Social History Social History   Tobacco Use   Smoking status: Former    Packs/day: 3.00    Years: 15.00    Total pack years: 45.00    Types: Cigarettes    Quit date: 09/14/1993    Years since quitting: 28.4   Smokeless tobacco: Never   Tobacco comments:    Quit 26 yrs ago  Vaping Use   Vaping Use: Never used  Substance Use Topics   Alcohol use: Yes    Alcohol/week: 1.0 - 2.0 standard drink of alcohol    Types: 1 - 2 Glasses of wine per week    Comment: occ   Drug use: No   Allergies   Losartan potassium, Ace inhibitors, Aspirin, Ceclor [cefaclor], Oxycodone, Sulfa antibiotics, and Lisinopril  Review of Systems Review of Systems Pertinent  findings revealed after performing a 14 point review of systems has been noted in the history of present illness.  Physical Exam Triage Vital Signs ED Triage Vitals  Enc Vitals Group     BP 02/11/21 0827 (!) 147/82     Pulse Rate 02/11/21 0827 72     Resp 02/11/21 0827 18     Temp 02/11/21 0827 98.3 F (36.8 C)     Temp Source 02/11/21 0827 Oral     SpO2 02/11/21 0827 98 %     Weight --      Height --      Head Circumference --      Peak Flow --      Pain Score 02/11/21 0826 5     Pain Loc --      Pain Edu? --      Excl. in GC? --   No data found.  Updated Vital Signs   BP (!) 142/94 (BP Location: Left Arm)   Pulse 79   Temp 98.7 F (37.1 C) (Oral)   Resp 16   SpO2 97%   Physical Exam Vitals and nursing note reviewed.  Constitutional:      General: She is not in acute distress.    Appearance: Normal appearance. She is not ill-appearing.  HENT:     Head: Normocephalic and atraumatic.  Eyes:     General: Lids are normal.        Right eye: No discharge.        Left eye: No discharge.     Extraocular Movements: Extraocular movements intact.     Conjunctiva/sclera: Conjunctivae normal.     Right eye: Right conjunctiva is not injected.     Left eye: Left conjunctiva is not injected.  Neck:     Trachea: Trachea and phonation normal.  Cardiovascular:     Rate and Rhythm: Normal rate and regular rhythm.     Pulses: Normal pulses.     Heart sounds: Normal heart sounds. No murmur heard.    No friction rub. No gallop.  Pulmonary:     Effort: Pulmonary effort is normal. No accessory muscle usage, prolonged expiration or respiratory distress.     Breath sounds: Normal breath sounds. No stridor, decreased air movement or transmitted upper airway sounds. No decreased breath sounds, wheezing, rhonchi or rales.  Chest:     Chest wall: No tenderness.  Abdominal:     General: Abdomen is flat. Bowel sounds are normal. There is no distension.     Palpations: Abdomen is soft.      Tenderness: There is no abdominal tenderness. There is no right CVA tenderness or left CVA tenderness.     Hernia: No hernia is present.  Musculoskeletal:        General: Normal range of motion.     Cervical back: Normal range of motion and neck supple. Normal range of motion.  Lymphadenopathy:     Cervical: No cervical adenopathy.  Skin:    General: Skin is warm and dry.     Findings: No erythema or rash.  Neurological:     General: No focal deficit present.     Mental Status: She is alert and oriented to person, place, and time.  Psychiatric:        Mood and Affect: Mood normal.        Behavior: Behavior normal.     Visual Acuity Right Eye Distance:   Left Eye Distance:   Bilateral Distance:    Right Eye Near:   Left Eye Near:    Bilateral Near:     UC Couse / Diagnostics / Procedures:     Radiology No results found.  Procedures Procedures (including critical care time) EKG  Pending results:  Labs Reviewed  POCT URINALYSIS DIP (MANUAL ENTRY) - Abnormal; Notable for the following components:      Result Value   Color, UA colorless (*)    Glucose, UA =500 (*)    Spec Grav, UA <=1.005 (*)    All other components within normal limits    Medications Ordered in UC: Medications - No data to display  UC Diagnoses / Final Clinical Impressions(s)   I have reviewed the triage vital signs and the nursing notes.  Pertinent labs & imaging results that were available during my care of the patient were reviewed by me and considered in my medical decision making (see chart for details).    Final  diagnoses:  None   ***  ED Prescriptions   None    PDMP not reviewed this encounter.  Pending results:  Labs Reviewed  POCT URINALYSIS DIP (MANUAL ENTRY) - Abnormal; Notable for the following components:      Result Value   Color, UA colorless (*)    Glucose, UA =500 (*)    Spec Grav, UA <=1.005 (*)    All other components within normal limits    Discharge  Instructions:   Discharge Instructions      Your urinalysis today was not concerning for acute urinary tract infection.  You did have glucose in your urine which is not a normal finding.  Please be sure you monitor your blood sugar and keeping your a.m. blood sugars below 120 and your 2-hour postprandial blood sugars below 180.  If you begin to have symptoms of worsening urinary tract infection such as urgency, hesitancy, burning with urination, abnormal urine odor, please come back and we will repeat urinalysis and treat you if needed.  Thank you for visiting urgent care today.      Disposition Upon Discharge:  Condition: stable for discharge home  Patient presented with an acute illness with associated systemic symptoms and significant discomfort requiring urgent management. In my opinion, this is a condition that a prudent lay person (someone who possesses an average knowledge of health and medicine) may potentially expect to result in complications if not addressed urgently such as respiratory distress, impairment of bodily function or dysfunction of bodily organs.   Routine symptom specific, illness specific and/or disease specific instructions were discussed with the patient and/or caregiver at length.   As such, the patient has been evaluated and assessed, work-up was performed and treatment was provided in alignment with urgent care protocols and evidence based medicine.  Patient/parent/caregiver has been advised that the patient may require follow up for further testing and treatment if the symptoms continue in spite of treatment, as clinically indicated and appropriate.  Patient/parent/caregiver has been advised to return to the [facility] or PCP if no better; to PCP or the Emergency Department if new signs and symptoms develop, or if the current signs or symptoms continue to change or worsen for further workup, evaluation and treatment as clinically indicated and appropriate  The patient  will follow up with their current PCP if and as advised. If the patient does not currently have a PCP we will assist them in obtaining one.   The patient may need specialty follow up if the symptoms continue, in spite of conservative treatment and management, for further workup, evaluation, consultation and treatment as clinically indicated and appropriate.   Patient/parent/caregiver verbalized understanding and agreement of plan as discussed.  All questions were addressed during visit.  Please see discharge instructions below for further details of plan.  This office note has been dictated using Museum/gallery curator.  Unfortunately, this method of dictation can sometimes lead to typographical or grammatical errors.  I apologize for your inconvenience in advance if this occurs.  Please do not hesitate to reach out to me if clarification is needed.

## 2022-02-28 NOTE — ED Triage Notes (Signed)
Pt c/o decreased UO-started last night-requesting to be checked for UTI-NAD-steady gait

## 2022-03-05 NOTE — Progress Notes (Unsigned)
[facility], [place], [place] [address] [phone]  Date:  03/06/2022   Name:  Erin Fields   DOB:  1965-08-24   MRN:  [id number]  PCP:  Darreld Mclean, MD    Chief Complaint: surgical clearance (Hip Replacement /Concerns/ questions: discuss allergies /)   History of Present Illness:  Erin Fields is a 56 y.o. very pleasant female patient who presents with the following:  Patient seen today for surgical clearance and a few other concerns   Most recent visit with myself was a virtual visit in September for bronchitis She plans to have a 2nd hip replacement in January of this year She has a cardiology appointment coming up next month -history significant for right-sided breast cancer, type 2 diabetes, nonobstructive coronary artery disease, hypertension, hyperlipidemia, end-stage hip arthritis, previous history of cardiomyopathy and ICD placement with right ventricular perforation   She had her right hip replaced already in September of this year- she is getting her left done 1/2.   She has a cardiology visit next month Her first hip did great!  She is eager to get the 2nd hip done   Lab Results  Component Value Date   HGBA1C 5.8 (H) 12/07/2021   Update foot exam Needs microalbumin Flu shot- complete  Recommend COVID booster- done in October   She notes her allergies are getting worse She has zyrtec, singulair, nasacort, and astapro, and atrovent nasal These things help some, but she is still struggling with PND esp when she goes to bed at night The PND leads to cough She has year round allergies but worst in the spring and fall  Worsening year by year She is getting immunotherapy  She would really like to have a short course of prednisone to help with her allergy sx   Lab Results  Component Value Date   TSH 1.04 09/14/2021     Patient Active Problem List   Diagnosis Date  Noted   DJD (degenerative joint disease) 12/20/2021   S/P total right hip arthroplasty 12/20/2021   Breast cancer ([place]) 05/11/2021   Hemothorax on left 10/02/2020   S/P evacuation of hematoma 10/02/2020   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 10/02/2020   Hypovolemic shock ([place]) 10/02/2020   Anxiety    Coronary artery disease 25% of RCA, 20% intermediate branch, 40% diagonal branch based on cardiac cath from 2022 07/22/2020   Dilated cardiomyopathy ([place]) ejection fraction 25% by echocardiogram from January 2022 04/29/2020   Congestive heart failure ([place]) 04/29/2020   Allergic rhinitis due to animal (cat) (dog) hair and dander 04/27/2020   Allergic rhinitis due to pollen 04/27/2020   Mild persistent asthma, uncomplicated 50/35/4656   PONV (postoperative nausea and vomiting)    Migraines    GERD (gastroesophageal reflux disease)    Environmental allergies    Diabetes mellitus without complication ([place])    Depression    Colon polyps    B12 deficiency 11/27/2019   IDA (iron deficiency anemia) 11/06/2019   Asthma, persistent controlled 10/05/2018   Recurrent infections 10/05/2018   Anxiety and depression 01/20/2018   Right upper lobe pulmonary nodule 01/10/2018   Nasal polyp, unspecified 01/10/2018   Left sided abdominal pain 12/13/2017   Impaired fasting glucose 12/12/2017   History of adenomatous polyp of colon 11/19/2017   Genetic testing 07/16/2017   Family history of breast cancer    Controlled type 2 diabetes mellitus  without complication, without long-term current use of insulin ([place]) 05/28/2017   Personal history of radiation therapy 2019   Cancer ([place]) 2019   Malignant neoplasm of upper-outer quadrant of right breast in female, estrogen receptor positive ([place]) 03/20/2017   Crohn disease ([place]) 11/29/2016   Borderline diabetes 12/17/2015   Ex-smoker 11/18/2015   Chronic venous insufficiency 09/02/2015   Symptomatic varicose veins, right 09/02/2015   Chronic  sinusitis 01/26/2015   Eustachian tube dysfunction 12/04/2014   Other allergic rhinitis 09/15/2014   Dyspnea 09/15/2014   Liver hemangioma 08/24/2014   Elevated triglycerides with high cholesterol 11/26/2013   Insomnia 08/20/2013   Hypertension 08/20/2013   Ulcerative colitis ([place]) 04/20/2013   Oral herpes 04/20/2013   Hyperlipidemia 12/06/2006   HEADACHE 12/06/2006   CERVICAL CANCER, HX OF 12/06/2006   Hypothyroidism 11/29/2006   Asthma 11/29/2006   IRRITABLE BOWEL SYNDROME 11/29/2006    Past Medical History:  Diagnosis Date   Allergic rhinitis due to animal (cat) (dog) hair and dander 04/27/2020   Allergic rhinitis due to pollen 04/27/2020   Anxiety    Anxiety and depression 01/20/2018   Arthritis    Asthma    Asthma, persistent controlled 10/05/2018   B12 deficiency 11/27/2019   Borderline diabetes 12/17/2015   Breast cancer ([place])    Cancer ([place]) 2019   right breast cancer   CERVICAL CANCER, HX OF 12/06/2006   Qualifier: Diagnosis of  By: Arnoldo Morale MD, John E    Chronic sinusitis 01/26/2015   Chronic venous insufficiency 09/02/2015   Colon polyps    Congestive heart failure ([place]) 04/29/2020   Controlled type 2 diabetes mellitus without complication, without long-term current use of insulin ([place]) 05/28/2017   Coronary artery disease 25% of RCA, 20% intermediate branch, 40% diagonal branch based on cardiac cath from 2022 07/22/2020   Crohn disease ([place]) 11/29/2016   Depression    Diabetes mellitus without complication ([place])    type 2   Dilated cardiomyopathy ([place]) ejection fraction 25% by echocardiogram from January 2022 04/29/2020   Dyspnea 09/15/2014   Elevated triglycerides with high cholesterol 11/26/2013   Environmental allergies    Eustachian tube dysfunction 12/04/2014   Ex-smoker 11/18/2015   Family history of breast cancer    Genetic testing 07/16/2017   Negative genetic testing on the multi-cancer panel.  The Multi-Gene Panel offered by [name] includes  sequencing and/or deletion duplication testing of the following 83 genes: ALK, APC, ATM, AXIN2,BAP1,  BARD1, BLM, BMPR1A, BRCA1, BRCA2, BRIP1, CASR, CDC73, CDH1, CDK4, CDKN1B, CDKN1C, CDKN2A (p14ARF), CDKN2A (p16INK4a), CEBPA, CHEK2, CTNNA1, DICER1, DIS3L2, EGFR (c.2369C>T, p.Thr790Met variant onl   GERD (gastroesophageal reflux disease)    HEADACHE 12/06/2006   Qualifier: Diagnosis of  By: Arnoldo Morale MD, John E    Hemothorax on left 10/02/2020   History of adenomatous polyp of colon 11/19/2017   Hyperlipidemia    Hypertension    Hypothyroidism    IDA (iron deficiency anemia) 11/06/2019   Impaired fasting glucose 12/12/2017   Insomnia 08/20/2013   Irritable bowel syndrome 11/29/2006   Qualifier: Diagnosis of  By: Arnoldo Morale MD, John E    Left sided abdominal pain 12/13/2017   Liver hemangioma 08/24/2014   Malignant neoplasm of upper-outer quadrant of right breast in female, estrogen receptor positive ([place]) 03/20/2017   Migraines    Mild persistent asthma, uncomplicated 82/64/1583   Nasal polyp, unspecified 01/10/2018   Oral herpes 04/20/2013   Other allergic rhinitis 09/15/2014   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 10/02/2020  Personal history of radiation therapy 2019   36 radiation tx   PONV (postoperative nausea and vomiting)    scopolamine patch helps   Recurrent infections 10/05/2018   Right upper lobe pulmonary nodule 01/10/2018   Symptomatic varicose veins, right 09/02/2015   Ulcerative colitis ([place])    Varicose veins     Past Surgical History:  Procedure Laterality Date   BREAST BIOPSY Right 02/2017   BREAST BIOPSY Right 11/07/2019   BREAST LUMPECTOMY Right 04/24/2017   BREAST LUMPECTOMY WITH RADIOACTIVE SEED AND SENTINEL LYMPH NODE BIOPSY Right 04/24/2017   Procedure: RIGHT BREAST LUMPECTOMY WITH RADIOACTIVE SEED AND SENTINEL LYMPH NODE BIOPSY;  Surgeon: [name], MD;  Location: [facility];  Service: General;  Laterality: Right;    CORONARY ARTERY BYPASS GRAFT     crapal tunnel relase Left 05/2018   ENDOMETRIAL ABLATION  2010   Had abnormal uterine bleeding (heavy and frequent), performed in [place], menses stopped completely 2 years after   EXPLORATORY LAPAROTOMY     Had exploratory surgery at the age of 4 for abdominal pain with cyst found on her left ovary, not treated surgically   FOOT SURGERY Right 2012   GENERATOR REMOVAL  10/02/2020   Procedure: GENERATOR REMOVAL;  Surgeon: Rexene Alberts, MD;  Location: [facility];  Service: Thoracic;;   HAND SURGERY Right    ICD IMPLANT N/A 10/01/2020   Procedure: ICD IMPLANT;  Surgeon: Constance Haw, MD;  Location: [facility];  Service: Cardiovascular;  Laterality: N/A;   ICD LEAD REMOVAL N/A 10/02/2020   Procedure: ICD LEAD REMOVAL;  Surgeon: Rexene Alberts, MD;  Location: [facility];  Service: Thoracic;  Laterality: N/A;   MEDIASTERNOTOMY N/A 10/02/2020   Procedure: MEDIAN STERNOTOMY REPAIR PERFORATED VENTRICLE EVACUATION LEFT HEMOTHORAX;  Surgeon: Rexene Alberts, MD;  Location: [facility];  Service: Thoracic;  Laterality: N/A;   RE-EXCISION OF BREAST LUMPECTOMY Right 05/15/2017   Procedure: RE-EXCISION OF BREAST LUMPECTOMY;  Surgeon: [name], MD;  Location: [facility];  Service: General;  Laterality: Right;   RIGHT/LEFT HEART CATH AND CORONARY ANGIOGRAPHY N/A 06/21/2020   Procedure: RIGHT/LEFT HEART CATH AND CORONARY ANGIOGRAPHY;  Surgeon: Nelva Bush, MD;  Location: [facility];  Service: Cardiovascular;  Laterality: N/A;   ROBOTIC ASSISTED SALPINGO OOPHERECTOMY Bilateral 08/29/2019   Procedure: XI ROBOTIC ASSISTED SALPINGO OOPHORECTOMY;  Surgeon: [name], MD;  Location[facility];  Service: Gynecology;  Laterality: Bilateral;   Septoplasty with turbinate reduction     TEE WITHOUT CARDIOVERSION N/A 10/02/2020   Procedure: TRANSESOPHAGEAL ECHOCARDIOGRAM (TEE);  Surgeon: Rexene Alberts, MD;  Location:  [facility];  Service: Thoracic;  Laterality: N/A;   [place] Right 12/20/2021   Procedure: RIGHT TOTAL HIP ARTHROPLASTY ANTERIOR APPROACH;  Surgeon: [name], MD;  Location: [facility];  Service: Orthopedics;  Laterality: Right;   VEIN SURGERY     Removal Right Leg   WISDOM TOOTH EXTRACTION      Social History   Tobacco Use   Smoking status: Former    Packs/day: 3.00    Years: 15.00    Total pack years: 45.00    Types: Cigarettes    Quit date: 09/14/1993    Years since quitting: 28.4   Smokeless tobacco: Never   Tobacco comments:    Quit 26 yrs ago  Vaping Use   Vaping Use: Never used  Substance Use Topics   Alcohol use:  Yes    Alcohol/week: 1.0 - 2.0 standard drink of alcohol    Types: 1 - 2 Glasses of wine per week    Comment: occ   Drug use: No    Family History  Problem Relation Age of Onset   Arthritis Mother 75       Deceased   Breast cancer Mother    Lung cancer Mother    Hyperlipidemia Mother    Heart disease Mother    Hypertension Mother    Diabetes Mother    Crohn's disease Mother    Diabetes Maternal Grandfather    Heart disease Maternal Grandfather    Heart attack Maternal Grandfather    Leukemia Maternal Grandfather    Colitis Sister    Leukemia Cousin 3       mat cousin   Lung cancer Father    Brain cancer Father     Allergies  Allergen Reactions   Losartan Potassium Shortness Of Breath   Ace Inhibitors Cough   Aspirin     Uncoated Aspirin upsets stomach   Ceclor [Cefaclor] Hives    Has tolerated Ancef in 2022   Oxycodone Hives   Sulfa Antibiotics Hives   Lisinopril Cough    Medication list has been reviewed and updated.  Current Outpatient Medications on File Prior to Visit  Medication Sig Dispense Refill   acetaminophen (TYLENOL) 500 MG tablet Take 2 tablets (1,000 mg total) by mouth every 6 (six) hours as needed for moderate pain or mild pain. 30 tablet 0   acyclovir (ZOVIRAX) 400 MG tablet TAKE 1  TABLET (400 MG TOTAL) BY MOUTH 3 TIMES A DAY AS NEEDED 270 tablet 1   Adalimumab 80 MG/0.8ML PNKT Inject 80 mg into the skin every 14 (fourteen) days.     albuterol (VENTOLIN HFA) 108 (90 Base) MCG/ACT inhaler Inhale 2 puffs into the lungs every 6 (six) hours as needed for shortness of breath.     ascorbic acid (VITAMIN C) 1000 MG tablet Take 1,000 mg by mouth daily.     aspirin EC 81 MG tablet Take 1 tablet (81 mg total) by mouth 2 (two) times daily. To prevent blood clots after surgery 60 tablet 0   Azelastine HCl 0.15 % SOLN Place 2 sprays into both nostrils 2 (two) times daily.     carvedilol (COREG) 12.5 MG tablet Take 1 tablet (12.5 mg total) by mouth 2 (two) times daily. 180 tablet 3   cetirizine (ZYRTEC) 10 MG tablet Take 10 mg by mouth daily.     dapagliflozin propanediol (FARXIGA) 10 MG TABS tablet Take 1 tablet (10 mg total) by mouth daily before breakfast. 30 tablet 11   diphenoxylate-atropine (LOMOTIL) 2.5-0.025 MG tablet Take 2 tablets by mouth 4 (four) times daily as needed for diarrhea or loose stools.     EPINEPHrine 0.3 mg/0.3 mL IJ SOAJ injection Inject 0.3 mg into the muscle as needed for anaphylaxis.     exemestane (AROMASIN) 25 MG tablet TAKE 1 TABLET (25 MG TOTAL) BY MOUTH DAILY AFTER BREAKFAST. 90 tablet 3   Hyoscyamine Sulfate 0.375 MG TBCR Take 0.375 mg by mouth 2 (two) times daily as needed (stomach cramping).     ipratropium (ATROVENT) 0.06 % nasal spray Place 2 sprays into both nostrils 4 (four) times daily. Use as needed 15 mL 12   lansoprazole (PREVACID SOLUTAB) 15 MG disintegrating tablet Take 15 mg by mouth 2 (two) times daily before a meal.     levothyroxine (SYNTHROID) 75 MCG tablet  TAKE 1 TABLET BY MOUTH DAILY BEFORE BREAKFAST. 90 tablet 0   MAGNESIUM PO Take 250 mg by mouth daily.     Menthol, Topical Analgesic, (BIOFREEZE COLORLESS) 4 % GEL Apply 1 application topically daily as needed (Hip pain).     mesalamine (LIALDA) 1.2 g EC tablet Take 2.4 g by mouth in  the morning and at bedtime.      metFORMIN (GLUCOPHAGE) 500 MG tablet TAKE 1 TABLET BY MOUTH 2 TIMES DAILY WITH A MEAL. 180 tablet 3   montelukast (SINGULAIR) 10 MG tablet Take 10 mg by mouth at bedtime.      Omega 3 1000 MG CAPS Take 2,000 mg by mouth every other day.     rosuvastatin (CRESTOR) 20 MG tablet Take 1 tablet (20 mg total) by mouth daily. 90 tablet 0   sacubitril-valsartan (ENTRESTO) 97-103 MG Take 1 tablet by mouth 2 (two) times daily. 180 tablet 1   spironolactone (ALDACTONE) 25 MG tablet Take 1 tablet (25 mg total) by mouth daily. 90 tablet 3   SYMBICORT 160-4.5 MCG/ACT inhaler INHALE 2 PUFFS INTO THE LUNGS TWICE A DAY 30.6 each 1   triamcinolone (NASACORT) 55 MCG/ACT AERO nasal inhaler Place 2 sprays into the nose daily.     venlafaxine XR (EFFEXOR-XR) 150 MG 24 hr capsule TAKE 1 CAPSULE BY MOUTH DAILY WITH BREAKFAST. 90 capsule 1   zinc gluconate 50 MG tablet Take 50 mg by mouth daily.     zolpidem (AMBIEN) 5 MG tablet TAKE 1 TABLET BY MOUTH EVERY DAY AT BEDTIME AS NEEDED FOR SLEEP 30 tablet 3   No current facility-administered medications on file prior to visit.    Review of Systems:  As per HPI- otherwise negative.   Physical Examination: Vitals:   03/06/22 1521  BP: 112/60  Pulse: 85  Resp: 18  Temp: 97.8 F (36.6 C)  SpO2: 97%   Vitals:   03/06/22 1521  Weight: 163 lb 3.2 oz (74 kg)  Height: 5' 3.5" (1.613 m)   Body mass index is 28.46 kg/m. Ideal Body Weight: Weight in (lb) to have BMI = 25: 143.1  GEN: no acute distress. Overweight, looks well  HEENT: Atraumatic, Normocephalic.  Ears and Nose: No external deformity. CV: RRR, No M/G/R. No JVD. No thrill. No extra heart sounds. PULM: CTA B, no wheezes, crackles, rhonchi. No retractions. No resp. distress. No accessory muscle use. ABD: S, NT, ND, +BS. No rebound. No HSM. EXTR: No c/c/e PSYCH: Normally interactive. Conversant.  Normal foot exam   Assessment and Plan: Diabetes mellitus without  complication ([place]) - Plan: Microalbumin / creatinine urine ratio, Hemoglobin I4P, Basic metabolic panel  Essential hypertension  Hypothyroidism due to acquired atrophy of thyroid - Plan: TSH  Hyperkalemia - Plan: predniSONE (DELTASONE) 20 MG tablet  Following up today- DM monitoring with A1c BP is under good control Check TSH Short course of prednisone for allergies Will plan further follow- up pending labs. Can clear for surgery with cardiology eval once form arrives   Signed Lamar Blinks, MD  Addendum 11/21, received labs as below.  Message to patient  Results for orders placed or performed in visit on 03/06/22  Microalbumin / creatinine urine ratio  Result Value Ref Range   Microalb, Ur <0.7 0.0 - 1.9 mg/dL   Creatinine,U 17.0 mg/dL   Microalb Creat Ratio 4.1 0.0 - 30.0 mg/g  Hemoglobin A1c  Result Value Ref Range   Hgb A1c MFr Bld 6.5 4.6 - 6.5 %  TSH  Result Value Ref Range   TSH 0.63 0.35 - 5.50 uIU/mL  Basic metabolic panel  Result Value Ref Range   Sodium 132 (L) 135 - 145 mEq/L   Potassium 4.4 3.5 - 5.1 mEq/L   Chloride 100 96 - 112 mEq/L   CO2 24 19 - 32 mEq/L   Glucose, Bld 99 70 - 99 mg/dL   BUN 10 6 - 23 mg/dL   Creatinine, Ser 0.77 0.40 - 1.20 mg/dL   GFR 84.76 >60.00 mL/min   Calcium 9.6 8.4 - 10.5 mg/dL

## 2022-03-05 NOTE — Patient Instructions (Incomplete)
It was great to see you again today, Best of luck with your upcoming surgery! Recommend 1200 mg of calcium daily through diet and supplementation  Also recommend 2000 iu of vitamin D   I will be in touch with your labs asap Try the prednisone for the allergy exacerbation

## 2022-03-06 ENCOUNTER — Ambulatory Visit: Payer: No Typology Code available for payment source | Admitting: Family Medicine

## 2022-03-06 VITALS — BP 112/60 | HR 85 | Temp 97.8°F | Resp 18 | Ht 63.5 in | Wt 163.2 lb

## 2022-03-06 DIAGNOSIS — E119 Type 2 diabetes mellitus without complications: Secondary | ICD-10-CM

## 2022-03-06 DIAGNOSIS — E875 Hyperkalemia: Secondary | ICD-10-CM

## 2022-03-06 DIAGNOSIS — I10 Essential (primary) hypertension: Secondary | ICD-10-CM | POA: Diagnosis not present

## 2022-03-06 DIAGNOSIS — E034 Atrophy of thyroid (acquired): Secondary | ICD-10-CM | POA: Diagnosis not present

## 2022-03-06 MED ORDER — PREDNISONE 20 MG PO TABS: ORAL_TABLET | ORAL | 0 refills | Status: DC

## 2022-03-07 ENCOUNTER — Encounter: Payer: Self-pay | Admitting: Family Medicine

## 2022-03-07 LAB — BASIC METABOLIC PANEL
BUN: 10 mg/dL (ref 6–23)
CO2: 24 mEq/L (ref 19–32)
Calcium: 9.6 mg/dL (ref 8.4–10.5)
Chloride: 100 mEq/L (ref 96–112)
Creatinine, Ser: 0.77 mg/dL (ref 0.40–1.20)
GFR: 84.76 mL/min (ref >60.00)
Glucose, Bld: 99 mg/dL (ref 70–99)
Potassium: 4.4 mEq/L (ref 3.5–5.1)
Sodium: 132 mEq/L — ABNORMAL LOW (ref 135–145)

## 2022-03-07 LAB — HEMOGLOBIN A1C: Hgb A1c MFr Bld: 6.5 % (ref 4.6–6.5)

## 2022-03-07 LAB — MICROALBUMIN / CREATININE URINE RATIO
Creatinine,U: 17 mg/dL
Microalb Creat Ratio: 4.1 mg/g (ref 0.0–30.0)
Microalb, Ur: <0.7 mg/dL (ref 0.0–1.9)

## 2022-03-07 LAB — TSH: TSH: 0.63 u[IU]/mL (ref 0.35–5.50)

## 2022-03-16 ENCOUNTER — Other Ambulatory Visit (HOSPITAL_COMMUNITY): Payer: Self-pay | Admitting: Cardiology

## 2022-03-16 ENCOUNTER — Ambulatory Visit: Payer: No Typology Code available for payment source | Admitting: Family Medicine

## 2022-03-21 ENCOUNTER — Ambulatory Visit: Payer: No Typology Code available for payment source | Attending: Cardiology | Admitting: Cardiology

## 2022-03-21 ENCOUNTER — Encounter: Payer: Self-pay | Admitting: Cardiology

## 2022-03-21 ENCOUNTER — Encounter: Payer: Self-pay | Admitting: Family Medicine

## 2022-03-21 VITALS — BP 110/68 | HR 91 | Ht 63.5 in | Wt 161.0 lb

## 2022-03-21 DIAGNOSIS — I5022 Chronic systolic (congestive) heart failure: Secondary | ICD-10-CM | POA: Diagnosis not present

## 2022-03-21 DIAGNOSIS — I420 Dilated cardiomyopathy: Secondary | ICD-10-CM

## 2022-03-21 DIAGNOSIS — I10 Essential (primary) hypertension: Secondary | ICD-10-CM

## 2022-03-21 DIAGNOSIS — I2510 Atherosclerotic heart disease of native coronary artery without angina pectoris: Secondary | ICD-10-CM | POA: Diagnosis not present

## 2022-03-21 DIAGNOSIS — E119 Type 2 diabetes mellitus without complications: Secondary | ICD-10-CM

## 2022-03-21 DIAGNOSIS — R0609 Other forms of dyspnea: Secondary | ICD-10-CM

## 2022-03-21 NOTE — Progress Notes (Signed)
Cardiology Office Note:    Date:  03/21/2022   ID:  Erin Fields, DOB 07-11-1965, MRN [id number]  PCP:  Darreld Mclean, MD  Cardiologist:  Jenne Campus, MD    Referring MD: Darreld Mclean, MD   Chief Complaint  Patient presents with   Medical Clearance    Left hip replacement 04/18/2022 Dr. Edmonia Lynch    History of Present Illness:    Erin Fields is a 56 y.o. female   with complex past medical history.  She was initially referred to [place] because of EKG being abnormal that was done before elective hip replacement surgery after that echocardiogram has been performed and surprising she was found to have 25% ejection fraction which is severely diminished, that was followed by cardiac catheterization which showed nonobstructive disease she was put on appropriate medication in spite of that her ejection fraction was still diminished, she was referred to our EP team for consideration for ICD.  ICD was implanted and everything was fine she left home however while at home she started having chest pain.  She ended going to the emergency room thinking that she may have discharged from the defibrillator, device has been interrogated no discharge has being identified she was discharged home, however presented back within the next few hours with shortness of breath.  She suffered from perforation of the right ventricle with injury of the artery leading to blood collection in the pleura.  She required open heart surgery with repair of the perforation of the right ventricle as well as evacuation of hematoma from the left pleura.   She comes today to be evaluated for elective left hip replacement surgery which is scheduled on January 2.  Overall she is doing very well.  She denies have any chest pain tightness squeezing pressure burning chest some fatigue and shortness of breath is like always there.  Past Medical History:  Diagnosis Date   Allergic rhinitis due to animal (cat) (dog)  hair and dander 04/27/2020   Allergic rhinitis due to pollen 04/27/2020   Anxiety    Anxiety and depression 01/20/2018   Arthritis    Asthma    Asthma, persistent controlled 10/05/2018   B12 deficiency 11/27/2019   Borderline diabetes 12/17/2015   Breast cancer ([place])    Cancer ([place]) 2019   right breast cancer   CERVICAL CANCER, HX OF 12/06/2006   Qualifier: Diagnosis of  By: Arnoldo Morale MD, John E    Chronic sinusitis 01/26/2015   Chronic venous insufficiency 09/02/2015   Colon polyps    Congestive heart failure ([place]) 04/29/2020   Controlled type 2 diabetes mellitus without complication, without long-term current use of insulin ([place]) 05/28/2017   Coronary artery disease 25% of RCA, 20% intermediate branch, 40% diagonal branch based on cardiac cath from 2022 07/22/2020   Crohn disease ([place]) 11/29/2016   Depression    Diabetes mellitus without complication ([place])    type 2   Dilated cardiomyopathy ([place]) ejection fraction 25% by echocardiogram from January 2022 04/29/2020   Dyspnea 09/15/2014   Elevated triglycerides with high cholesterol 11/26/2013   Environmental allergies    Eustachian tube dysfunction 12/04/2014   Ex-smoker 11/18/2015   Family history of breast cancer    Genetic testing 07/16/2017   Negative genetic testing on the multi-cancer panel.  The Multi-Gene Panel offered by [name] includes sequencing and/or deletion duplication testing of the following 83 genes: ALK, APC, ATM, AXIN2,BAP1,  BARD1, BLM, BMPR1A, BRCA1, BRCA2, BRIP1, CASR, CDC73, CDH1, CDK4,  CDKN1B, CDKN1C, CDKN2A (p14ARF), CDKN2A (p16INK4a), CEBPA, CHEK2, CTNNA1, DICER1, DIS3L2, EGFR (c.2369C>T, p.Thr790Met variant onl   GERD (gastroesophageal reflux disease)    HEADACHE 12/06/2006   Qualifier: Diagnosis of  By: Arnoldo Morale MD, John E    Hemothorax on left 10/02/2020   History of adenomatous polyp of colon 11/19/2017   Hyperlipidemia    Hypertension    Hypothyroidism    IDA (iron deficiency anemia) 11/06/2019    Impaired fasting glucose 12/12/2017   Insomnia 08/20/2013   Irritable bowel syndrome 11/29/2006   Qualifier: Diagnosis of  By: Arnoldo Morale MD, John E    Left sided abdominal pain 12/13/2017   Liver hemangioma 08/24/2014   Malignant neoplasm of upper-outer quadrant of right breast in female, estrogen receptor positive ([place]) 03/20/2017   Migraines    Mild persistent asthma, uncomplicated 13/11/6576   Nasal polyp, unspecified 01/10/2018   Oral herpes 04/20/2013   Other allergic rhinitis 09/15/2014   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 10/02/2020   Personal history of radiation therapy 2019   36 radiation tx   PONV (postoperative nausea and vomiting)    scopolamine patch helps   Recurrent infections 10/05/2018   Right upper lobe pulmonary nodule 01/10/2018   Symptomatic varicose veins, right 09/02/2015   Ulcerative colitis ([place])    Varicose veins     Past Surgical History:  Procedure Laterality Date   BREAST BIOPSY Right 02/2017   BREAST BIOPSY Right 11/07/2019   BREAST LUMPECTOMY Right 04/24/2017   BREAST LUMPECTOMY WITH RADIOACTIVE SEED AND SENTINEL LYMPH NODE BIOPSY Right 04/24/2017   Procedure: RIGHT BREAST LUMPECTOMY WITH RADIOACTIVE SEED AND SENTINEL LYMPH NODE BIOPSY;  Surgeon: [name], MD;  Location: [facility];  Service: General;  Laterality: Right;   CORONARY ARTERY BYPASS GRAFT     crapal tunnel relase Left 05/2018   ENDOMETRIAL ABLATION  2010   Had abnormal uterine bleeding (heavy and frequent), performed in [place], menses stopped completely 2 years after   EXPLORATORY LAPAROTOMY     Had exploratory surgery at the age of 55 for abdominal pain with cyst found on her left ovary, not treated surgically   FOOT SURGERY Right 2012   GENERATOR REMOVAL  10/02/2020   Procedure: GENERATOR REMOVAL;  Surgeon: Rexene Alberts, MD;  Location: [facility];  Service: Thoracic;;   HAND SURGERY Right    ICD IMPLANT N/A 10/01/2020   Procedure: ICD  IMPLANT;  Surgeon: Constance Haw, MD;  Location: [facility];  Service: Cardiovascular;  Laterality: N/A;   ICD LEAD REMOVAL N/A 10/02/2020   Procedure: ICD LEAD REMOVAL;  Surgeon: Rexene Alberts, MD;  Location: [facility];  Service: Thoracic;  Laterality: N/A;   MEDIASTERNOTOMY N/A 10/02/2020   Procedure: MEDIAN STERNOTOMY REPAIR PERFORATED VENTRICLE EVACUATION LEFT HEMOTHORAX;  Surgeon: Rexene Alberts, MD;  Location: [facility];  Service: Thoracic;  Laterality: N/A;   RE-EXCISION OF BREAST LUMPECTOMY Right 05/15/2017   Procedure: RE-EXCISION OF BREAST LUMPECTOMY;  Surgeon: [name], MD;  Location: [facility];  Service: General;  Laterality: Right;   RIGHT/LEFT HEART CATH AND CORONARY ANGIOGRAPHY N/A 06/21/2020   Procedure: RIGHT/LEFT HEART CATH AND CORONARY ANGIOGRAPHY;  Surgeon: Nelva Bush, MD;  Location: [facility];  Service: Cardiovascular;  Laterality: N/A;   ROBOTIC ASSISTED SALPINGO OOPHERECTOMY Bilateral 08/29/2019   Procedure: XI ROBOTIC ASSISTED SALPINGO OOPHORECTOMY;  Surgeon: [name], MD;  [facility];  Service: Gynecology;  Laterality: Bilateral;   Septoplasty with  turbinate reduction     TEE WITHOUT CARDIOVERSION N/A 10/02/2020   Procedure: TRANSESOPHAGEAL ECHOCARDIOGRAM (TEE);  Surgeon: Rexene Alberts, MD;  Location[facility];  Service: Thoracic;  Laterality: N/A;   [place] Right 12/20/2021   Procedure: RIGHT TOTAL HIP ARTHROPLASTY ANTERIOR APPROACH;  Surgeon: [name], MD;  Location: [facility];  Service: Orthopedics;  Laterality: Right;   VEIN SURGERY     Removal Right Leg   WISDOM TOOTH EXTRACTION      Current Medications: Current Meds  Medication Sig   acetaminophen (TYLENOL) 500 MG tablet Take 2 tablets (1,000 mg total) by mouth every 6 (six) hours as needed for moderate pain or mild pain.   acyclovir (ZOVIRAX) 400 MG tablet TAKE 1 TABLET (400 MG TOTAL)  BY MOUTH 3 TIMES A DAY AS NEEDED (Patient taking differently: Take 400 mg by mouth 3 (three) times daily as needed.)   Adalimumab 80 MG/0.8ML PNKT Inject 80 mg into the skin every 14 (fourteen) days.   albuterol (VENTOLIN HFA) 108 (90 Base) MCG/ACT inhaler Inhale 2 puffs into the lungs every 6 (six) hours as needed for shortness of breath.   aspirin EC 81 MG tablet Take 1 tablet (81 mg total) by mouth 2 (two) times daily. To prevent blood clots after surgery   Azelastine HCl 0.15 % SOLN Place 2 sprays into both nostrils 2 (two) times daily.   carvedilol (COREG) 12.5 MG tablet TAKE 1.5 TABLETS (18.75 MG TOTAL) BY MOUTH 2 (TWO) TIMES DAILY. (Patient taking differently: Take 12.5 mg by mouth 2 (two) times daily with a meal.)   cetirizine (ZYRTEC) 10 MG tablet Take 10 mg by mouth daily.   dapagliflozin propanediol (FARXIGA) 10 MG TABS tablet Take 1 tablet (10 mg total) by mouth daily before breakfast.   diphenoxylate-atropine (LOMOTIL) 2.5-0.025 MG tablet Take 2 tablets by mouth 4 (four) times daily as needed for diarrhea or loose stools.   EPINEPHrine 0.3 mg/0.3 mL IJ SOAJ injection Inject 0.3 mg into the muscle as needed for anaphylaxis.   exemestane (AROMASIN) 25 MG tablet TAKE 1 TABLET (25 MG TOTAL) BY MOUTH DAILY AFTER BREAKFAST.   Hyoscyamine Sulfate 0.375 MG TBCR Take 0.375 mg by mouth 2 (two) times daily as needed (stomach cramping).   ipratropium (ATROVENT) 0.06 % nasal spray Place 2 sprays into both nostrils 4 (four) times daily. Use as needed   lansoprazole (PREVACID SOLUTAB) 15 MG disintegrating tablet Take 15 mg by mouth 2 (two) times daily before a meal.   levothyroxine (SYNTHROID) 75 MCG tablet TAKE 1 TABLET BY MOUTH DAILY BEFORE BREAKFAST.   MAGNESIUM PO Take 250 mg by mouth daily.   Menthol, Topical Analgesic, (BIOFREEZE COLORLESS) 4 % GEL Apply 1 application topically daily as needed (Hip pain).   mesalamine (LIALDA) 1.2 g EC tablet Take 2.4 g by mouth in the morning and at bedtime.     metFORMIN (GLUCOPHAGE) 500 MG tablet TAKE 1 TABLET BY MOUTH 2 TIMES DAILY WITH A MEAL. (Patient taking differently: Take 500 mg by mouth 2 (two) times daily with a meal.)   montelukast (SINGULAIR) 10 MG tablet Take 10 mg by mouth at bedtime.    Omega 3 1000 MG CAPS Take 2,000 mg by mouth every other day.   predniSONE (DELTASONE) 20 MG tablet Take 40 mg by mouth daily for 3 days, then 20 mg by mouth daily for 3 days (Patient taking differently: Take 20 mg by mouth daily with breakfast. Take 40  mg by mouth daily for 3 days, then 20 mg by mouth daily for 3 days)   rosuvastatin (CRESTOR) 20 MG tablet Take 1 tablet (20 mg total) by mouth daily.   sacubitril-valsartan (ENTRESTO) 97-103 MG Take 1 tablet by mouth 2 (two) times daily.   spironolactone (ALDACTONE) 25 MG tablet Take 1 tablet (25 mg total) by mouth daily.   SYMBICORT 160-4.5 MCG/ACT inhaler INHALE 2 PUFFS INTO THE LUNGS TWICE A DAY (Patient taking differently: Inhale 2 puffs into the lungs 2 (two) times daily.)   triamcinolone (NASACORT) 55 MCG/ACT AERO nasal inhaler Place 2 sprays into the nose daily.   venlafaxine XR (EFFEXOR-XR) 150 MG 24 hr capsule TAKE 1 CAPSULE BY MOUTH DAILY WITH BREAKFAST. (Patient taking differently: Take 150 mg by mouth daily with breakfast.)   zinc gluconate 50 MG tablet Take 50 mg by mouth daily.   zolpidem (AMBIEN) 5 MG tablet TAKE 1 TABLET BY MOUTH EVERY DAY AT BEDTIME AS NEEDED FOR SLEEP (Patient taking differently: Take 5 mg by mouth at bedtime as needed for sleep.)     Allergies:   Losartan potassium, Ace inhibitors, Aspirin, Ceclor [cefaclor], Oxycodone, Sulfa antibiotics, and Lisinopril   Social History   Socioeconomic History   Marital status: Widowed    Spouse name: Not on file   Number of children: Not on file   Years of education: Not on file   Highest education level: Not on file  Occupational History   Not on file  Tobacco Use   Smoking status: Former    Packs/day: 3.00    Years: 15.00     Total pack years: 45.00    Types: Cigarettes    Quit date: 09/14/1993    Years since quitting: 28.5   Smokeless tobacco: Never   Tobacco comments:    Quit 26 yrs ago  Vaping Use   Vaping Use: Never used  Substance and Sexual Activity   Alcohol use: Yes    Alcohol/week: 1.0 - 2.0 standard drink of alcohol    Types: 1 - 2 Glasses of wine per week    Comment: occ   Drug use: No   Sexual activity: Not Currently    Birth control/protection: Post-menopausal    Comment: ablation  Other Topics Concern   Not on file  Social History Narrative   Not on file   Social Determinants of Health   Financial Resource Strain: Not on file  Food Insecurity: No Food Insecurity (12/20/2021)   Hunger Vital Sign    Worried About Running Out of Food in the Last Year: Never true    Ran Out of Food in the Last Year: Never true  Transportation Needs: No Transportation Needs (12/20/2021)   PRAPARE - Hydrologist (Medical): No    Lack of Transportation (Non-Medical): No  Physical Activity: Not on file  Stress: Not on file  Social Connections: Not on file     Family History: The patient's family history includes Arthritis (age of onset: 3) in her mother; Brain cancer in her father; Breast cancer in her mother; Colitis in her sister; Crohn's disease in her mother; Diabetes in her maternal grandfather and mother; Heart attack in her maternal grandfather; Heart disease in her maternal grandfather and mother; Hyperlipidemia in her mother; Hypertension in her mother; Leukemia in her maternal grandfather; Leukemia (age of onset: 63) in her cousin; Lung cancer in her father and mother. ROS:   Please see the history of present illness.  All 14 point review of systems negative except as described per history of present illness  EKGs/Labs/Other Studies Reviewed:      Recent Labs: 06/08/2021: B Natriuretic Peptide 22.6 01/18/2022: ALT 12; Hemoglobin 12.4; Platelet Count  397 03/06/2022: BUN 10; Creatinine, Ser 0.77; Potassium 4.4; Sodium 132; TSH 0.63  Recent Lipid Panel    Component Value Date/Time   CHOL 132 09/14/2021 0955   CHOL 128 08/10/2020 0936   TRIG 202.0 (H) 09/14/2021 0955   HDL 40.10 09/14/2021 0955   HDL 37 (L) 08/10/2020 0936   CHOLHDL 3 09/14/2021 0955   VLDL 40.4 (H) 09/14/2021 0955   LDLCALC 65 08/10/2020 0936   LDLDIRECT 66.0 09/14/2021 0955    Physical Exam:    VS:  BP 110/68 (BP Location: Left Arm, Patient Position: Sitting)   Pulse 91   Ht 5' 3.5" (1.613 m)   Wt 161 lb (73 kg)   SpO2 96%   BMI 28.07 kg/m     Wt Readings from Last 3 Encounters:  03/21/22 161 lb (73 kg)  03/06/22 163 lb 3.2 oz (74 kg)  01/18/22 160 lb 6.4 oz (72.8 kg)     GEN:  Well nourished, well developed in no acute distress HEENT: Normal NECK: No JVD; No carotid bruits LYMPHATICS: No lymphadenopathy CARDIAC: RRR, no murmurs, no rubs, no gallops RESPIRATORY:  Clear to auscultation without rales, wheezing or rhonchi  ABDOMEN: Soft, non-tender, non-distended MUSCULOSKELETAL:  No edema; No deformity  SKIN: Warm and dry LOWER EXTREMITIES: no swelling NEUROLOGIC:  Alert and oriented x 3 PSYCHIATRIC:  Normal affect   ASSESSMENT:    1. Coronary artery disease involving native coronary artery of native heart without angina pectoris   2. Chronic systolic congestive heart failure ([place])   3. Dilated cardiomyopathy ([place]) ejection fraction 25% by echocardiogram from January 2022   4. Primary hypertension   5. Controlled type 2 diabetes mellitus without complication, without long-term current use of insulin ([facility])    PLAN:    In order of problems listed above:  Coronary disease stable from that point review on antiplatelet therapy which I will continue. Chronic systolic congestive heart failure last estimation 4045%, I will ask her to have another echocardiogram to reassess left ventricle ejection fraction.  In the meantime we will continue current  dose directed medical therapy.  Including Entresto, beta-blocker, SGLT2 blocker Essential hypertension blood pressure well-controlled continue present management. Dyslipidemia I did review her K PN which show me her LDL 6 5 HDL 40.  Will continue with present medications which include Crestor 20. Cardiovascular preop evaluation before elective left hip replacement.  I will ask her to have an echocardiogram if echocardiogram showed same ejection fraction will be good to proceed.   Medication Adjustments/Labs and Tests Ordered: Current medicines are reviewed at length with the patient today.  Concerns regarding medicines are outlined above.  No orders of the defined types were placed in this encounter.  Medication changes: No orders of the defined types were placed in this encounter.   Signed, Park Liter, MD, [facility] 03/21/2022 3:38 PM    [place]

## 2022-03-21 NOTE — Patient Instructions (Addendum)
Medication Instructions:  Your physician recommends that you continue on your current medications as directed. Please refer to the Current Medication list given to you today.  *If you need a refill on your cardiac medications before your next appointment, please call your pharmacy*   Lab Work: None Ordered If you have labs (blood work) drawn today and your tests are completely normal, you will receive your results only by: [place] (if you have MyChart) OR A paper copy in the mail If you have any lab test that is abnormal or we need to change your treatment, we will call you to review the results.   Testing/Procedures: Your physician has requested that you have an echocardiogram. Echocardiography is a painless test that uses sound waves to create images of your heart. It provides your doctor with information about the size and shape of your heart and how well your heart's chambers and valves are working. This procedure takes approximately one hour. There are no restrictions for this procedure. Please do NOT wear cologne, perfume, aftershave, or lotions (deodorant is allowed). Please arrive 15 minutes prior to your appointment time.    Follow-Up: At [facility], you and your health needs are our priority.  As part of our continuing mission to provide you with exceptional heart care, we have created designated Provider Care Teams.  These Care Teams include your primary Cardiologist (physician) and Advanced Practice Providers (APPs -  Physician Assistants and Nurse Practitioners) who all work together to provide you with the care you need, when you need it.  We recommend signing up for the patient portal called "MyChart".  Sign up information is provided on this After Visit Summary.  MyChart is used to connect with patients for Virtual Visits (Telemedicine).  Patients are able to view lab/test results, encounter notes, upcoming appointments, etc.  Non-urgent messages can be sent to your  provider as well.   To learn more about what you can do with MyChart, go to [email or web address].    Your next appointment:   6 month(s)  The format for your next appointment:   In Person  Provider:   Jenne Campus, MD    Other Instructions NA

## 2022-03-21 NOTE — Addendum Note (Signed)
Addended by: Jacobo Forest D on: 03/21/2022 03:44 PM   Modules accepted: Orders

## 2022-03-31 ENCOUNTER — Ambulatory Visit (HOSPITAL_COMMUNITY): Payer: No Typology Code available for payment source | Attending: Cardiology

## 2022-03-31 DIAGNOSIS — R0609 Other forms of dyspnea: Secondary | ICD-10-CM | POA: Insufficient documentation

## 2022-03-31 LAB — ECHOCARDIOGRAM COMPLETE
Area-P 1/2: 4.41 cm2
Calc EF: 59.4 %
S' Lateral: 3.2 cm
Single Plane A2C EF: 58.4 %
Single Plane A4C EF: 62.6 %

## 2022-04-04 NOTE — Telephone Encounter (Signed)
Results reviewed with pt as per Dr. Wendy Poet note.  Pt verbalized understanding and had no additional questions. Routed to PCP

## 2022-04-04 NOTE — Pre-Procedure Instructions (Signed)
Surgical Instructions    Your procedure is scheduled on April 18, 2021.  Report to [facility] Main Entrance "A" at 05:30 A.M., then check in with the Admitting office.  Call this number if you have problems the morning of surgery:  [phone]   If you have any questions prior to your surgery date call [phone]: Open Monday-Friday 8am-4pm If you experience any cold or flu symptoms such as cough, fever, chills, shortness of breath, etc. between now and your scheduled surgery, please notify us at the above number     Remember:  Do not eat after midnight the night before your surgery  You may drink clear liquids until 04:30am the morning of your surgery.   Clear liquids allowed are: Water, Non-Citrus Juices (without pulp), Carbonated Beverages, Clear Tea, Black Coffee ONLY (NO MILK, CREAM OR POWDERED CREAMER of any kind), and Gatorade   Enhanced Recovery after Surgery for Orthopedics Enhanced Recovery after Surgery is a protocol used to improve the stress on your body and your recovery after surgery.  Patient Instructions   The day of surgery (if you have diabetes):  Drink ONE small 10 oz bottle of water or G2 Gatorade by 4:30am the morning of surgery This bottle was given to you during your hospital  pre-op appointment visit.  Nothing else to drink after completing the  Small bottle of G2 Gatorade.         If you have questions, please contact your surgeon's office.     Take these medicines the morning of surgery with A SIP OF WATER: carvedilol (COREG) cetirizine (ZYRTEC) exemestane (AROMASIN) lansoprazole (PREVACID SOLUTAB) levothyroxine (SYNTHROID) mesalamine (LIALDA) rosuvastatin (CRESTOR) SYMBICORT triamcinolone (NASACORT) venlafaxine XR (EFFEXOR-XR)   If Needed: Albuterol inhaler-Please bring all inhalers with you the day of surgery.  acetaminophen (TYLENOL) acyclovir (ZOVIRAX) Nasal spray diphenoxylate-atropine (LOMOTIL) HYDROcodone-acetaminophen  (NORCO) Hycoscyamine Sulfate Epi Pen   Follow your surgeon's instructions on when to stop Aspirin.  If no instructions were given by your surgeon then you will need to call the office to get those instructions.     As of today, STOP taking any Aleve, Naproxen, Ibuprofen, Motrin, Advil, Goody's, BC's, all herbal medications, fish oil, and all vitamins. This includes: meloxicam (MOBIC).   WHAT DO I DO ABOUT MY DIABETES MEDICATION?     Do not take metFORMIN (GLUCOPHAGE) the morning of surgery.   Hold dapagliflozin propanediol (FARXIGA) for 72 hours prior to surgery.     HOW TO MANAGE YOUR DIABETES BEFORE AND AFTER SURGERY   Why is it important to control my blood sugar before and after surgery? Improving blood sugar levels before and after surgery helps healing and can limit problems. A way of improving blood sugar control is eating a healthy diet by:  Eating less sugar and carbohydrates  Increasing activity/exercise  Talking with your doctor about reaching your blood sugar goals High blood sugars (greater than 180 mg/dL) can raise your risk of infections and slow your recovery, so you will need to focus on controlling your diabetes during the weeks before surgery. Make sure that the doctor who takes care of your diabetes knows about your planned surgery including the date and location.   How do I manage my blood sugar before surgery? Check your blood sugar at least 4 times a day, starting 2 days before surgery, to make sure that the level is not too high or low.   Check your blood sugar the morning of your surgery when you wake up and every  2 hours until you get to the Short Stay unit.   If your blood sugar is less than 70 mg/dL, you will need to treat for low blood sugar: Do not take insulin. Treat a low blood sugar (less than 70 mg/dL) with  cup of clear juice (cranberry or apple), 4 glucose tablets, OR glucose gel. Recheck blood sugar in 15 minutes after treatment (to make sure  it is greater than 70 mg/dL). If your blood sugar is not greater than 70 mg/dL on recheck, call [phone] for further instructions. Report your blood sugar to the short stay nurse when you get to            Do not wear jewelry or makeup. Do not wear lotions, powders, perfumes or deodorant. Do not shave 48 hours prior to surgery.  Do not bring valuables to the hospital. Do not wear nail polish, gel polish, artificial nails, or any other type of covering on natural nails (fingers and toes) If you have artificial nails or gel coating that need to be removed by a nail salon, please have this removed prior to surgery. Artificial nails or gel coating may interfere with anesthesia's ability to adequately monitor your vital signs.  [facility] is not responsible for any belongings or valuables.    Do NOT Smoke (Tobacco/Vaping)  24 hours prior to your procedure  If you use a CPAP at night, you may bring your mask for your overnight stay.   Contacts, glasses, hearing aids, dentures or partials may not be worn into surgery, please bring cases for these belongings   For patients admitted to the hospital, discharge time will be determined by your treatment team.   Patients discharged the day of surgery will not be allowed to drive home, and someone needs to stay with them for 24 hours.   SURGICAL WAITING ROOM VISITATION Patients having surgery or a procedure may have no more than 2 support people in the waiting area - these visitors may rotate.   Children under the age of 74 must have an adult with them who is not the patient. If the patient needs to stay at the hospital during part of their recovery, the visitor guidelines for inpatient rooms apply. Pre-op nurse will coordinate an appropriate time for 1 support person to accompany patient in pre-op.  This support person may not rotate.   Please refer to [email or web address] for the visitor  guidelines for Inpatients (after your surgery is over and you are in a regular room).    Special instructions:    Oral Hygiene is also important to reduce your risk of infection.  Remember - BRUSH YOUR TEETH THE MORNING OF SURGERY WITH YOUR REGULAR TOOTHPASTE   [facility]- Preparing For Surgery  Before surgery, you can play an important role. Because skin is not sterile, your skin needs to be as free of germs as possible. You can reduce the number of germs on your skin by washing with CHG (chlorahexidine gluconate) Soap before surgery.  CHG is an antiseptic cleaner which kills germs and bonds with the skin to continue killing germs even after washing.     Please do not use if you have an allergy to CHG or antibacterial soaps. If your skin becomes reddened/irritated stop using the CHG.  Do not shave (including legs and underarms) for at least 48 hours prior to first CHG shower. It is OK to shave your face.  Please follow these instructions carefully.     Shower the [employer]  BEFORE SURGERY and the MORNING OF SURGERY with CHG Soap.   If you chose to wash your hair, wash your hair first as usual with your normal shampoo. After you shampoo, rinse your hair and body thoroughly to remove the shampoo.  Then [employer] and genitals (private parts) with your normal soap and rinse thoroughly to remove soap.  After that Use CHG Soap as you would any other liquid soap. You can apply CHG directly to the skin and wash gently with a scrungie or a clean washcloth.   Apply the CHG Soap to your body ONLY FROM THE NECK DOWN.  Do not use on open wounds or open sores. Avoid contact with your eyes, ears, mouth and genitals (private parts). Wash Face and genitals (private parts)  with your normal soap.   Wash thoroughly, paying special attention to the area where your surgery will be performed.  Thoroughly rinse your body with warm water from the neck down.  DO NOT shower/wash with your normal soap after using  and rinsing off the CHG Soap.  Pat yourself dry with a CLEAN TOWEL.  Wear CLEAN PAJAMAS to bed the night before surgery  Place CLEAN SHEETS on your bed the night before your surgery  DO NOT SLEEP WITH PETS.   Day of Surgery:  Take a shower with CHG soap. Wear Clean/Comfortable clothing the morning of surgery Do not apply any deodorants/lotions.   Remember to brush your teeth WITH YOUR REGULAR TOOTHPASTE.    If you received a COVID test during your pre-op visit, it is requested that you wear a mask when out in public, stay away from anyone that may not be feeling well, and notify your surgeon if you develop symptoms. If you have been in contact with anyone that has tested positive in the last 10 days, please notify your surgeon.    Please read over the following fact sheets that you were given.

## 2022-04-04 NOTE — H&P (Cosign Needed Addendum)
HIP ARTHROPLASTY ADMISSION H&P  Patient ID: Erin Fields MRN: [id number] DOB/AGE: March 01, 1966 57 y.o.  Chief Complaint: left hip pain.  Planned Procedure Date: 05/23/22 Medical Clearance by Dr. Edilia Bo   Cardiac Clearance by Dr. Agustin Cree   HPI: Erin Fields is a 56 y.o. female who presents for evaluation of OA LEFT HIP. The patient has a history of pain and functional disability in the left hip due to arthritis and has failed non-surgical conservative treatments for greater than 12 weeks to include NSAID's and/or analgesics, supervised PT with diminished ADL's post treatment, and activity modification.  Onset of symptoms was gradual, starting 1 years ago with gradually worsening course since that time. The patient noted no past surgery on the left hip.  Patient currently rates pain at 5 out of 10 with activity. Patient has night pain, worsening of pain with activity and weight bearing, and pain that interferes with activities of daily living.  Patient has evidence of subchondral cysts, subchondral sclerosis, periarticular osteophytes, and joint space narrowing by imaging studies.  There is no active infection.  Past Medical History:  Diagnosis Date   Allergic rhinitis due to animal (cat) (dog) hair and dander 04/27/2020   Allergic rhinitis due to pollen 04/27/2020   Anxiety    Anxiety and depression 01/20/2018   Arthritis    Asthma    Asthma, persistent controlled 10/05/2018   B12 deficiency 11/27/2019   Borderline diabetes 12/17/2015   Breast cancer ([place])    Cancer ([place]) 2019   right breast cancer   CERVICAL CANCER, HX OF 12/06/2006   Qualifier: Diagnosis of  By: Arnoldo Morale MD, John E    Chronic sinusitis 01/26/2015   Chronic venous insufficiency 09/02/2015   Colon polyps    Congestive heart failure ([place]) 04/29/2020   Controlled type 2 diabetes mellitus without complication, without long-term current use of insulin ([place]) 05/28/2017   Coronary artery disease 25% of RCA, 20%  intermediate branch, 40% diagonal branch based on cardiac cath from 2022 07/22/2020   Crohn disease ([place]) 11/29/2016   Depression    Diabetes mellitus without complication ([place])    type 2   Dilated cardiomyopathy ([place]) ejection fraction 25% by echocardiogram from January 2022 04/29/2020   Dyspnea 09/15/2014   Elevated triglycerides with high cholesterol 11/26/2013   Environmental allergies    Eustachian tube dysfunction 12/04/2014   Ex-smoker 11/18/2015   Family history of breast cancer    Genetic testing 07/16/2017   Negative genetic testing on the multi-cancer panel.  The Multi-Gene Panel offered by [name] includes sequencing and/or deletion duplication testing of the following 83 genes: ALK, APC, ATM, AXIN2,BAP1,  BARD1, BLM, BMPR1A, BRCA1, BRCA2, BRIP1, CASR, CDC73, CDH1, CDK4, CDKN1B, CDKN1C, CDKN2A (p14ARF), CDKN2A (p16INK4a), CEBPA, CHEK2, CTNNA1, DICER1, DIS3L2, EGFR (c.2369C>T, p.Thr790Met variant onl   GERD (gastroesophageal reflux disease)    HEADACHE 12/06/2006   Qualifier: Diagnosis of  By: Arnoldo Morale MD, John E    Hemothorax on left 10/02/2020   History of adenomatous polyp of colon 11/19/2017   Hyperlipidemia    Hypertension    Hypothyroidism    IDA (iron deficiency anemia) 11/06/2019   Impaired fasting glucose 12/12/2017   Insomnia 08/20/2013   Irritable bowel syndrome 11/29/2006   Qualifier: Diagnosis of  By: Arnoldo Morale MD, John E    Left sided abdominal pain 12/13/2017   Liver hemangioma 08/24/2014   Malignant neoplasm of upper-outer quadrant of right breast in female, estrogen receptor positive ([place]) 03/20/2017   Migraines    Mild persistent  asthma, uncomplicated 85/05/7739   Nasal polyp, unspecified 01/10/2018   Oral herpes 04/20/2013   Other allergic rhinitis 09/15/2014   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 10/02/2020   Personal history of radiation therapy 2019   36 radiation tx   PONV (postoperative nausea and vomiting)    scopolamine patch  helps   Recurrent infections 10/05/2018   Right upper lobe pulmonary nodule 01/10/2018   Symptomatic varicose veins, right 09/02/2015   Ulcerative colitis ([place])    Varicose veins    Past Surgical History:  Procedure Laterality Date   BREAST BIOPSY Right 02/2017   BREAST BIOPSY Right 11/07/2019   BREAST LUMPECTOMY Right 04/24/2017   BREAST LUMPECTOMY WITH RADIOACTIVE SEED AND SENTINEL LYMPH NODE BIOPSY Right 04/24/2017   Procedure: RIGHT BREAST LUMPECTOMY WITH RADIOACTIVE SEED AND SENTINEL LYMPH NODE BIOPSY;  Surgeon: [name], MD;  Location: [facility];  Service: General;  Laterality: Right;   CORONARY ARTERY BYPASS GRAFT     crapal tunnel relase Left 05/2018   ENDOMETRIAL ABLATION  2010   Had abnormal uterine bleeding (heavy and frequent), performed in [place], menses stopped completely 2 years after   EXPLORATORY LAPAROTOMY     Had exploratory surgery at the age of 56 for abdominal pain with cyst found on her left ovary, not treated surgically   FOOT SURGERY Right 2012   GENERATOR REMOVAL  10/02/2020   Procedure: GENERATOR REMOVAL;  Surgeon: Rexene Alberts, MD;  Location: [facility];  Service: Thoracic;;   HAND SURGERY Right    ICD IMPLANT N/A 10/01/2020   Procedure: ICD IMPLANT;  Surgeon: Constance Haw, MD;  Location: [facility];  Service: Cardiovascular;  Laterality: N/A;   ICD LEAD REMOVAL N/A 10/02/2020   Procedure: ICD LEAD REMOVAL;  Surgeon: Rexene Alberts, MD;  Location: [facility];  Service: Thoracic;  Laterality: N/A;   MEDIASTERNOTOMY N/A 10/02/2020   Procedure: MEDIAN STERNOTOMY REPAIR PERFORATED VENTRICLE EVACUATION LEFT HEMOTHORAX;  Surgeon: Rexene Alberts, MD;  Location: [facility];  Service: Thoracic;  Laterality: N/A;   RE-EXCISION OF BREAST LUMPECTOMY Right 05/15/2017   Procedure: RE-EXCISION OF BREAST LUMPECTOMY;  Surgeon: [name], MD;  Location: [facility];  Service: General;  Laterality: Right;   RIGHT/LEFT HEART  CATH AND CORONARY ANGIOGRAPHY N/A 06/21/2020   Procedure: RIGHT/LEFT HEART CATH AND CORONARY ANGIOGRAPHY;  Surgeon: Nelva Bush, MD;  Location: [facility];  Service: Cardiovascular;  Laterality: N/A;   ROBOTIC ASSISTED SALPINGO OOPHERECTOMY Bilateral 08/29/2019   Procedure: XI ROBOTIC ASSISTED SALPINGO OOPHORECTOMY;  Surgeon: [name], MD;  Location: [facility];  Service: Gynecology;  Laterality: Bilateral;   Septoplasty with turbinate reduction     TEE WITHOUT CARDIOVERSION N/A 10/02/2020   Procedure: TRANSESOPHAGEAL ECHOCARDIOGRAM (TEE);  Surgeon: Rexene Alberts, MD;  Location: [facility];  Service: Thoracic;  Laterality: N/A;   [place] Right 12/20/2021   Procedure: RIGHT TOTAL HIP ARTHROPLASTY ANTERIOR APPROACH;  Surgeon: [name], MD;  Location: [facility];  Service: Orthopedics;  Laterality: Right;   VEIN SURGERY     Removal Right Leg   WISDOM TOOTH EXTRACTION     Allergies  Allergen Reactions   Losartan Potassium Shortness Of Breath   Ace Inhibitors Cough   Aspirin     Uncoated Aspirin upsets stomach   Ceclor [Cefaclor] Hives    Has tolerated Ancef in 2022   Oxycodone Hives   Sulfa Antibiotics Hives  Lisinopril Cough   Prior to Admission medications   Medication Sig Start Date End Date Taking? Authorizing Provider  acetaminophen (TYLENOL) 500 MG tablet Take 2 tablets (1,000 mg total) by mouth every 6 (six) hours as needed for moderate pain or mild pain. 12/21/21  Yes Angelgabriel Willmore M, PA-C  acyclovir (ZOVIRAX) 400 MG tablet TAKE 1 TABLET (400 MG TOTAL) BY MOUTH 3 TIMES A DAY AS NEEDED Patient taking differently: Take 400 mg by mouth 3 (three) times daily as needed. 01/30/22  Yes Copland, Gay Filler, MD  Adalimumab 80 MG/0.8ML PNKT Inject 80 mg into the skin every 14 (fourteen) days. 12/01/19  Yes [provider]  albuterol (VENTOLIN HFA) 108 (90 Base) MCG/ACT inhaler Inhale 2 puffs into the lungs  every 6 (six) hours as needed for shortness of breath. 09/22/20  Yes [provider]  aspirin EC 81 MG tablet Take 1 tablet (81 mg total) by mouth 2 (two) times daily. To prevent blood clots after surgery 12/21/21  Yes Nikkie Liming M, PA-C  Azelastine HCl 0.15 % SOLN Place 2 sprays into both nostrils 2 (two) times daily. 07/16/20  Yes [provider]  carvedilol (COREG) 12.5 MG tablet TAKE 1.5 TABLETS (18.75 MG TOTAL) BY MOUTH 2 (TWO) TIMES DAILY. Patient taking differently: Take 12.5 mg by mouth 2 (two) times daily with a meal. 03/16/22  Yes Larey Dresser, MD  cetirizine (ZYRTEC) 10 MG tablet Take 10 mg by mouth daily.   Yes [provider]  dapagliflozin propanediol (FARXIGA) 10 MG TABS tablet Take 1 tablet (10 mg total) by mouth daily before breakfast. 12/27/21  Yes Larey Dresser, MD  diphenoxylate-atropine (LOMOTIL) 2.5-0.025 MG tablet Take 2 tablets by mouth 4 (four) times daily as needed for diarrhea or loose stools.   Yes [provider]  EPINEPHrine 0.3 mg/0.3 mL IJ SOAJ injection Inject 0.3 mg into the muscle as needed for anaphylaxis.   Yes [provider]  exemestane (AROMASIN) 25 MG tablet TAKE 1 TABLET (25 MG TOTAL) BY MOUTH DAILY AFTER BREAKFAST. 07/20/21  Yes Alla Feeling, NP  Hyoscyamine Sulfate 0.375 MG TBCR Take 0.375 mg by mouth 2 (two) times daily as needed (stomach cramping).   Yes [provider]  ipratropium (ATROVENT) 0.06 % nasal spray Place 2 sprays into both nostrils 4 (four) times daily. Use as needed 06/29/21  Yes Copland, Gay Filler, MD  lansoprazole (PREVACID SOLUTAB) 15 MG disintegrating tablet Take 15 mg by mouth 2 (two) times daily before a meal.   Yes [provider]  levothyroxine (SYNTHROID) 75 MCG tablet TAKE 1 TABLET BY MOUTH DAILY BEFORE BREAKFAST. 01/24/22  Yes Copland, Gay Filler, MD  MAGNESIUM PO Take 250 mg by mouth daily.   Yes [provider]  Menthol, Topical Analgesic, (BIOFREEZE  COLORLESS) 4 % GEL Apply 1 application topically daily as needed (Hip pain).   Yes [provider]  mesalamine (LIALDA) 1.2 g EC tablet Take 2.4 g by mouth in the morning and at bedtime.    Yes [provider]  metFORMIN (GLUCOPHAGE) 500 MG tablet TAKE 1 TABLET BY MOUTH 2 TIMES DAILY WITH A MEAL. 10/24/21  Yes Copland, Gay Filler, MD  montelukast (SINGULAIR) 10 MG tablet Take 10 mg by mouth at bedtime.    Yes [provider]  rosuvastatin (CRESTOR) 20 MG tablet Take 1 tablet (20 mg total) by mouth daily. 02/20/22  Yes Copland, Gay Filler, MD  sacubitril-valsartan (ENTRESTO) 97-103 MG Take 1 tablet by mouth 2 (  two) times daily. 12/27/21  Yes Park Liter, MD  spironolactone (ALDACTONE) 25 MG tablet Take 1 tablet (25 mg total) by mouth daily. 11/01/21  Yes Larey Dresser, MD  SYMBICORT 160-4.5 MCG/ACT inhaler INHALE 2 PUFFS INTO THE LUNGS TWICE A DAY 02/20/22  Yes Saguier, Percell Miller, PA-C  triamcinolone (NASACORT) 55 MCG/ACT AERO nasal inhaler Place 2 sprays into the nose daily.   Yes [provider]  venlafaxine XR (EFFEXOR-XR) 150 MG 24 hr capsule TAKE 1 CAPSULE BY MOUTH DAILY WITH BREAKFAST. Patient taking differently: Take 150 mg by mouth daily with breakfast. 10/24/21  Yes Copland, Gay Filler, MD  zinc gluconate 50 MG tablet Take 50 mg by mouth daily.   Yes [provider]  zolpidem (AMBIEN) 5 MG tablet TAKE 1 TABLET BY MOUTH EVERY DAY AT BEDTIME AS NEEDED FOR SLEEP Patient taking differently: Take 5 mg by mouth at bedtime as needed for sleep. 12/07/21  Yes Copland, Gay Filler, MD  predniSONE (DELTASONE) 20 MG tablet Take 40 mg by mouth daily for 3 days, then 20 mg by mouth daily for 3 days Patient not taking: Reported on 03/31/2022 03/06/22   Copland, Gay Filler, MD   Social History   Socioeconomic History   Marital status: Widowed    Spouse name: Not on file   Number of children: Not on file   Years of education: Not on file   Highest education  level: Not on file  Occupational History   Not on file  Tobacco Use   Smoking status: Former    Packs/day: 3.00    Years: 15.00    Total pack years: 45.00    Types: Cigarettes    Quit date: 09/14/1993    Years since quitting: 28.5   Smokeless tobacco: Never   Tobacco comments:    Quit 26 yrs ago  Vaping Use   Vaping Use: Never used  Substance and Sexual Activity   Alcohol use: Yes    Alcohol/week: 1.0 - 2.0 standard drink of alcohol    Types: 1 - 2 Glasses of wine per week    Comment: occ   Drug use: No   Sexual activity: Not Currently    Birth control/protection: Post-menopausal    Comment: ablation  Other Topics Concern   Not on file  Social History Narrative   Not on file   Social Determinants of Health   Financial Resource Strain: Not on file  Food Insecurity: No Food Insecurity (12/20/2021)   Hunger Vital Sign    Worried About Running Out of Food in the Last Year: Never true    Ran Out of Food in the Last Year: Never true  Transportation Needs: No Transportation Needs (12/20/2021)   PRAPARE - Hydrologist (Medical): No    Lack of Transportation (Non-Medical): No  Physical Activity: Not on file  Stress: Not on file  Social Connections: Not on file   Family History  Problem Relation Age of Onset   Arthritis Mother 55       Deceased   Breast cancer Mother    Lung cancer Mother    Hyperlipidemia Mother    Heart disease Mother    Hypertension Mother    Diabetes Mother    Crohn's disease Mother    Diabetes Maternal Grandfather    Heart disease Maternal Grandfather    Heart attack Maternal Grandfather    Leukemia Maternal Grandfather    Colitis Sister    Leukemia Cousin 3  mat cousin   Lung cancer Father    Brain cancer Father     ROS: Currently denies lightheadedness, dizziness, Fever, chills, CP, SOB.   No personal history of DVT, PE, MI, or CVA. No loose teeth or dentures All other systems have been reviewed and were  otherwise currently negative with the exception of those mentioned in the HPI and as above.  Objective: Vitals: Ht: 5'4" Wt: 160 lbs Temp: 97.9 BP: 135/92 Pulse: 89 O2 97% on room air.   Physical Exam: General: Alert, NAD. Trendelenberg Gait  HEENT: EOMI, Good Neck Extension  Pulm: No increased work of breathing.  Clear B/L A/P w/o crackle or wheeze.  CV: RRR, No m/g/r appreciated  GI: soft, NT, ND. BS x 4 quadrants Neuro: CN II-XII grossly intact without focal deficit.  Sensation intact distally Skin: No lesions in the area of chief complaint MSK/Surgical Site: + TTP. Hip ROM decreased d/t pain. + [place]. + SLR. + FABER/FADIR. Decreased strength.  NVI.    Imaging Review Plain radiographs demonstrate severe degenerative joint disease of the left hip.   The bone quality appears to be fair for age and reported activity level.  Preoperative templating of the joint replacement has been completed, documented, and submitted to the Operating Room personnel in order to optimize intra-operative equipment management.  Assessment: OA LEFT HIP Active Problems:   * No active hospital problems. *   Plan: Plan for Procedure(s): TOTAL HIP ARTHROPLASTY ANTERIOR APPROACH  The patient history, physical exam, clinical judgement of the provider and imaging are consistent with end stage degenerative joint disease and total joint arthroplasty is deemed medically necessary. The treatment options including medical management, injection therapy, and arthroplasty were discussed at length. The risks and benefits of Procedure(s): TOTAL HIP ARTHROPLASTY ANTERIOR APPROACH were presented and reviewed.  The risks of nonoperative treatment, versus surgical intervention including but not limited to continued pain, aseptic loosening, stiffness, dislocation/subluxation, infection, bleeding, nerve injury, blood clots, cardiopulmonary complications, morbidity, mortality, among others were discussed. The patient  verbalizes understanding and wishes to proceed with the plan.  Patient is being admitted for surgery, pain control, PT, prophylactic antibiotics, VTE prophylaxis, progressive ambulation, ADL's and discharge planning. She will spend the night in observation.  Dental prophylaxis discussed and recommended for 2 years postoperatively.  The patient does meet the criteria for TXA which will be used perioperatively.   ASA 81 mg BID will be used postoperatively for DVT prophylaxis in addition to SCDs, and early ambulation. Plan for Oxycodone and Tylenol for pain.   Robaxin for muscle spasm.  Zofran for nausea and vomiting. Colace for constipation prevention [place] in [place] The patient is planning to be discharged home with HHPT ([place]) and into the care of her son Erin Fields who can be reached at [phone] Follow up appt 06/07/22 at 4:15pm     Alisa Graff Office [phone] 04/04/2022 3:44 PM

## 2022-04-05 ENCOUNTER — Encounter (HOSPITAL_COMMUNITY)
Admission: RE | Admit: 2022-04-05 | Discharge: 2022-04-05 | Disposition: A | Discharge status: Home / Self Care | Payer: No Typology Code available for payment source | Source: Ambulatory Visit | Attending: Orthopedic Surgery | Admitting: Orthopedic Surgery

## 2022-04-05 ENCOUNTER — Other Ambulatory Visit: Payer: Self-pay

## 2022-04-05 ENCOUNTER — Encounter (HOSPITAL_COMMUNITY): Payer: Self-pay

## 2022-04-05 VITALS — Ht 63.0 in | Wt 161.2 lb

## 2022-04-05 DIAGNOSIS — Z01812 Encounter for preprocedural laboratory examination: Secondary | ICD-10-CM | POA: Insufficient documentation

## 2022-04-05 DIAGNOSIS — J4530 Mild persistent asthma, uncomplicated: Secondary | ICD-10-CM | POA: Insufficient documentation

## 2022-04-05 DIAGNOSIS — Z01818 Encounter for other preprocedural examination: Secondary | ICD-10-CM

## 2022-04-05 DIAGNOSIS — C50911 Malignant neoplasm of unspecified site of right female breast: Secondary | ICD-10-CM | POA: Diagnosis not present

## 2022-04-05 DIAGNOSIS — I428 Other cardiomyopathies: Secondary | ICD-10-CM | POA: Diagnosis not present

## 2022-04-05 LAB — TYPE AND SCREEN
ABO/RH(D): O POS
Antibody Screen: NEGATIVE

## 2022-04-05 LAB — BASIC METABOLIC PANEL
Anion gap: 7 (ref 5–15)
BUN: <5 mg/dL — ABNORMAL LOW (ref 6–20)
CO2: 26 mmol/L (ref 22–32)
Calcium: 9.5 mg/dL (ref 8.9–10.3)
Chloride: 105 mmol/L (ref 98–111)
Creatinine, Ser: 0.67 mg/dL (ref 0.44–1.00)
GFR, Estimated: >60 mL/min (ref >60)
Glucose, Bld: 102 mg/dL — ABNORMAL HIGH (ref 70–99)
Potassium: 4.4 mmol/L (ref 3.5–5.1)
Sodium: 138 mmol/L (ref 135–145)

## 2022-04-05 LAB — CBC
HCT: 40.4 % (ref 36.0–46.0)
Hemoglobin: 13.2 g/dL (ref 12.0–15.0)
MCH: 28.7 pg (ref 26.0–34.0)
MCHC: 32.7 g/dL (ref 30.0–36.0)
MCV: 87.8 fL (ref 80.0–100.0)
Platelets: 342 10*3/uL (ref 150–400)
RBC: 4.6 MIL/uL (ref 3.87–5.11)
RDW: 13.4 % (ref 11.5–15.5)
WBC: 7.3 10*3/uL (ref 4.0–10.5)
nRBC: 0 % (ref 0.0–0.2)

## 2022-04-05 LAB — GLUCOSE, CAPILLARY: Glucose-Capillary: 137 mg/dL — ABNORMAL HIGH (ref 70–99)

## 2022-04-05 LAB — SURGICAL PCR SCREEN
MRSA, PCR: NEGATIVE
Staphylococcus aureus: NEGATIVE

## 2022-04-05 NOTE — Progress Notes (Signed)
PCP - Dr. Edilia Bo  Cardiologist - Dr. Aundra Dubin  EP- Denies  Endocrine- Denies  Pulm- Denies  Chest x-ray - 01/05/22 (E)  EKG - 03/21/22 (E)  Stress Test - Denies  ECHO - 03/31/22 (E)  Cardiac Cath - 06/21/20 (E)  AICD-na PM-na LOOP-na  Nerve Stimulator- Denies  Dialysis- Denies  Sleep Study - Yes- Negative CPAP - Denies  LABS- 04/05/22: CBC, BMP, T/S, PCR  ASA- LD- 12/26  ERAS- Yes w/G2 to finish by 0430  HA1C- 03/06/22(E): 6.5 Fasting Blood Sugar - 120-129 Checks Blood Sugar ___1__ time a week  Anesthesia- Yes- cardiac history  Pt denies having chest pain, sob, or fever at this time. All instructions explained to the pt, with a verbal understanding of the material. Pt agrees to go over the instructions while at home for a better understanding. Pt also instructed to wear a mask and social distance if she goes out. The opportunity to ask questions was provided.

## 2022-04-06 NOTE — Anesthesia Preprocedure Evaluation (Addendum)
Anesthesia Evaluation  Patient identified by MRN, date of birth, ID band Patient awake    Reviewed: Allergy & Precautions, NPO status , Patient's Chart, lab work & pertinent test results  History of Anesthesia Complications (+) PONV and history of anesthetic complications  Airway Mallampati: II  TM Distance: >3 FB Neck ROM: Full    Dental no notable dental hx. (+) Dental Advisory Given   Pulmonary asthma (well controlled, uses rescue inhaler about once a week) , former smoker   Pulmonary exam normal        Cardiovascular hypertension, Pt. on medications +CHF (LVEF 40-45%, mildly reduced RV fx)  Normal cardiovascular exam  Echo was done in 1/22 as part of pre-op workup, this showed low EF 25%.  She then had RHC/LHC in 3/22 with mild nonobstructive CAD and low cardiac index 2.1.  Echo in 4/22 showed EF still 20-25%.  She was referred for ICD.  St Jude ICD was placed in 1/58 but complicated by RV perforation with hemothorax and hemorrhagic shock.  ICD was removed and RV was surgically repaired with sternotomy.   Echo 05/2021  1. Left ventricular ejection fraction, by estimation, is 40 to 45%. The  left ventricle has mildly decreased function. The left ventricle  demonstrates global hypokinesis. Left ventricular diastolic parameters are  consistent with Grade I diastolic  dysfunction (impaired relaxation).   2. Right ventricular systolic function is mildly reduced. The right  ventricular size is normal. Tricuspid regurgitation signal is inadequate  for assessing PA pressure.   3. The mitral valve is normal in structure. No evidence of mitral valve  regurgitation. No evidence of mitral stenosis.   4. The aortic valve is tricuspid. Aortic valve regurgitation is not  visualized. No aortic stenosis is present.   5. The inferior vena cava is normal in size with greater than 50%  respiratory variability, suggesting right atrial pressure of  3 mmHg.    Neuro/Psych  Headaches PSYCHIATRIC DISORDERS Anxiety Depression       GI/Hepatic Neg liver ROS, PUD,GERD  Controlled and Medicated,,  Endo/Other  diabetes, Well Controlled, Type 2, Oral Hypoglycemic AgentsHypothyroidism    Renal/GU negative Renal ROS  negative genitourinary   Musculoskeletal  (+) Arthritis , Osteoarthritis,    Abdominal   Peds  Hematology negative hematology ROS (+) Hb 13.1, plt 427   Anesthesia Other Findings R arm restricted   Reproductive/Obstetrics negative OB ROS                              Anesthesia Physical Anesthesia Plan  ASA: 3  Anesthesia Plan: MAC and Spinal   Post-op Pain Management: Tylenol PO (pre-op)*   Induction:   PONV Risk Score and Plan: 2 and Propofol infusion, TIVA and Scopolamine patch - Pre-op  Airway Management Planned: Natural Airway and Simple Face Mask  Additional Equipment: None  Intra-op Plan:   Post-operative Plan:   Informed Consent: I have reviewed the patients History and Physical, chart, labs and discussed the procedure including the risks, benefits and alternatives for the proposed anesthesia with the patient or authorized representative who has indicated his/her understanding and acceptance.     Dental advisory given  Plan Discussed with: Anesthesiologist and CRNA  Anesthesia Plan Comments: (See recent PAT note by Karoline Caldwell, PA-C 04/05/22 copied below. Surgery postponed at that time due to positive covid, pt was symptomatic and treated outpatient with Paxlovid. Repeat preop labs on 05/11/22 showed hyponatremia with sodium  125 (pt with mild chronic hypoNa at baseline). Pt followed up with PCP Dr. Janett Billow Copland on 05/15/22 and repeat labs showed sodium improved to 133. She was also noted to have sinusitis and prescribed doxycycline and prednisone. I spoke with Dr. Lorelei Pont regarding upcoming surgery. She said the pt did not have any concerning symptoms on exam and did not  feel the sinusitis would necessarily preclude proceeding with surgery as long as symptoms improved with treatment.  PAT note 04/05/22: Follows with cardiology for history of NICM. Echo initially showed EF 25%, cath showed nonobstructive disease. She was put on GDMT but EF remained depressed. She underwent ICD placement 07/1935 which was complicated by perforation of the RV necessitating open heart surgery with repair of perforation and evacuation of hematoma of the left pleura. Last seen by Dr. Agustin Cree 03/21/22 for preop eval. Overall noted to be doing well from cardiac standpoint. He recommended repeat echo prior to surgery. Echo 03/31/22 showed recovery of LV systolic function with EF 60-65%, mildly reduced RV function, normal valves.  Recently underwent R THA 9/0/24 without complication.  Seen by PCP Dr. Lorelei Pont 03/06/22 for preop eval and medically cleared.  Hx of mild persistent asthma maintained on Symbicort.  Hx of R sided breast cancer s/p lumpectomy and sentinel node biopsy.  NIDDM2, A1c 6.5 on 03/06/22.  Proep labs reviewed, unremarkable.  EKG 03/21/22: NSR. Rate 86. T wave abnormality, consider anterolateral ischemia.  TTE 03/31/22:  1. Left ventricular ejection fraction, by estimation, is 60 to 65%. The  left ventricle has normal function. The left ventricle has no regional  wall motion abnormalities. Left ventricular diastolic parameters are  indeterminate.   2. Right ventricular systolic function is mildly reduced. The right  ventricular size is normal.   3. The mitral valve is normal in structure. No evidence of mitral valve  regurgitation. No evidence of mitral stenosis.   4. The aortic valve is normal in structure. Aortic valve regurgitation is  not visualized. No aortic stenosis is present.   5. The inferior vena cava is normal in size with greater than 50%  respiratory variability, suggesting right atrial pressure of 3 mmHg.   )         Anesthesia Quick  Evaluation

## 2022-04-06 NOTE — Progress Notes (Signed)
Anesthesia Chart Review:  Follows with cardiology for history of NICM. Echo initially showed EF 25%, cath showed nonobstructive disease. She was put on GDMT but EF remained depressed. She underwent ICD placement 06/5595 which was complicated by perforation of the RV necessitating open heart surgery with repair of perforation and evacuation of hematoma of the left pleura. Last seen by Dr. Agustin Cree 03/21/22 for preop eval. Overall noted to be doing well from cardiac standpoint. He recommended repeat echo prior to surgery. Echo 03/31/22 showed recovery of LV systolic function with EF 60-65%, mildly reduced RV function, normal valves.  Recently underwent R THA 07/16/61 without complication.  Seen by PCP Dr. Lorelei Pont 03/06/22 for preop eval and medically cleared.  Hx of mild persistent asthma maintained on Symbicort.  Hx of R sided breast cancer s/p lumpectomy and sentinel node biopsy.  NIDDM2, A1c 6.5 on 03/06/22.  Proep labs reviewed, unremarkable.  EKG 03/21/22: NSR. Rate 86. T wave abnormality, consider anterolateral ischemia.  TTE 03/31/22:  1. Left ventricular ejection fraction, by estimation, is 60 to 65%. The  left ventricle has normal function. The left ventricle has no regional  wall motion abnormalities. Left ventricular diastolic parameters are  indeterminate.   2. Right ventricular systolic function is mildly reduced. The right  ventricular size is normal.   3. The mitral valve is normal in structure. No evidence of mitral valve  regurgitation. No evidence of mitral stenosis.   4. The aortic valve is normal in structure. Aortic valve regurgitation is  not visualized. No aortic stenosis is present.   5. The inferior vena cava is normal in size with greater than 50%  respiratory variability, suggesting right atrial pressure of 3 mmHg.    Wynonia Musty [facility]/Anesthesiology Phone [phone] 04/06/2022 12:52 PM

## 2022-04-10 ENCOUNTER — Other Ambulatory Visit: Payer: Self-pay | Admitting: Family Medicine

## 2022-04-10 DIAGNOSIS — F5104 Psychophysiologic insomnia: Secondary | ICD-10-CM

## 2022-04-11 ENCOUNTER — Encounter: Payer: Self-pay | Admitting: Family Medicine

## 2022-04-11 DIAGNOSIS — F5104 Psychophysiologic insomnia: Secondary | ICD-10-CM

## 2022-04-11 MED ORDER — ZOLPIDEM TARTRATE 5 MG PO TABS: ORAL_TABLET | ORAL | 3 refills | Status: DC

## 2022-04-11 NOTE — Patient Instructions (Incomplete)
I am sorry you are not feeling well!

## 2022-04-11 NOTE — Progress Notes (Unsigned)
[place] [facility] [address] [place], [place] [address] [phone]  Date:  04/13/2022   Name:  Erin Fields   DOB:  [birth year]   MRN:  [id number]  PCP:  Darreld Mclean, MD    Chief Complaint: Sinusitis (Congestion, cough started last week. Taking Mucinex and Tylenol- she has not tested for covid. )   History of Present Illness:  Erin Fields is a 56 y.o. very pleasant female patient who presents with the following:  Patient seen today with concern of possible sinus infection Most recent visit with myself was in November for diabetes follow-up/preop clearance for second hip replacement-she plans to have hip surgery on January 2  -history significant for right-sided breast cancer, type 2 diabetes, nonobstructive coronary artery disease, hypertension, hyperlipidemia, end-stage hip arthritis, previous history of cardiomyopathy and ICD placement with right ventricular perforation    Last week pt awoke with congestion, cough, sinus headache- she has been sick for over a week She did not happen to test for covid as of yet No fever noted She is using a lot of mucinex- still blowing out discolored mucus She notes notes moslty sinusitis sx at this time with draiangin into her chest    Patient Active Problem List   Diagnosis Date Noted   DJD (degenerative joint disease) 12/20/2021   S/P total right hip arthroplasty 12/20/2021   Diabetes mellitus with insulin therapy ([place]) 10/24/2021   Lymphedema of breast 10/24/2021   Breast cancer ([place]) 05/11/2021   Hemothorax on left 10/02/2020   S/P evacuation of hematoma 10/02/2020   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 10/02/2020   Hypovolemic shock ([place]) 10/02/2020   Anxiety    Coronary artery disease 25% of RCA, 20% intermediate branch, 40% diagonal branch based on cardiac cath from 2022 07/22/2020   Dilated cardiomyopathy ([place]) ejection fraction 25% by  echocardiogram from January 2022 04/29/2020   Congestive heart failure ([place]) 04/29/2020   Allergic rhinitis due to animal (cat) (dog) hair and dander 04/27/2020   Allergic rhinitis due to pollen 04/27/2020   Mild persistent asthma, uncomplicated 23/30/0762   PONV (postoperative nausea and vomiting)    Migraines    GERD (gastroesophageal reflux disease)    Environmental allergies    Diabetes mellitus without complication ([place])    Depression    Colon polyps    B12 deficiency 11/27/2019   IDA (iron deficiency anemia) 11/06/2019   Asthma, persistent controlled 10/05/2018   Recurrent infections 10/05/2018   Anxiety and depression 01/20/2018   Right upper lobe pulmonary nodule 01/10/2018   Nasal polyp, unspecified 01/10/2018   Left sided abdominal pain 12/13/2017   Impaired fasting glucose 12/12/2017   History of adenomatous polyp of colon 11/19/2017   Genetic testing 07/16/2017   Family history of breast cancer    Controlled type 2 diabetes mellitus without complication, without long-term current use of insulin ([place]) 05/28/2017   Personal history of radiation therapy 2019   Cancer ([place]) 2019   Malignant neoplasm of upper-outer quadrant of right breast in female, estrogen receptor positive ([place]) 03/20/2017   Crohn disease ([place]) 11/29/2016   Borderline diabetes 12/17/2015   Ex-smoker 11/18/2015   Chronic venous insufficiency 09/02/2015   Symptomatic varicose veins, right 09/02/2015   Chronic sinusitis 01/26/2015   Eustachian tube dysfunction 12/04/2014   Other allergic rhinitis 09/15/2014   Dyspnea 09/15/2014   Liver hemangioma 08/24/2014   Elevated triglycerides with high cholesterol 11/26/2013  Insomnia 08/20/2013   Hypertension 08/20/2013   Ulcerative colitis ([place]) 04/20/2013   Oral herpes 04/20/2013   Hyperlipidemia 12/06/2006   HEADACHE 12/06/2006   CERVICAL CANCER, HX OF 12/06/2006   Hypothyroidism 11/29/2006   Asthma 11/29/2006   IRRITABLE BOWEL SYNDROME 11/29/2006     Past Medical History:  Diagnosis Date   Allergic rhinitis due to animal (cat) (dog) hair and dander 04/27/2020   Allergic rhinitis due to pollen 04/27/2020   Anxiety    Anxiety and depression 01/20/2018   Arthritis    Asthma    Asthma, persistent controlled 10/05/2018   B12 deficiency 11/27/2019   Borderline diabetes 12/17/2015   Breast cancer ([name])    Cancer ([place]) 2019   right breast cancer   CERVICAL CANCER, HX OF 12/06/2006   Qualifier: Diagnosis of  By: Arnoldo Morale MD, John E    Chronic sinusitis 01/26/2015   Chronic venous insufficiency 09/02/2015   Colon polyps    Congestive heart failure ([place]) 04/29/2020   Controlled type 2 diabetes mellitus without complication, without long-term current use of insulin ([place]) 05/28/2017   Coronary artery disease 25% of RCA, 20% intermediate branch, 40% diagonal branch based on cardiac cath from 2022 07/22/2020   Crohn disease ([place]) 11/29/2016   Depression    Diabetes mellitus without complication ([place])    type 2   Dilated cardiomyopathy ([place]) ejection fraction 25% by echocardiogram from January 2022 04/29/2020   Dyspnea 09/15/2014   Elevated triglycerides with high cholesterol 11/26/2013   Environmental allergies    Eustachian tube dysfunction 12/04/2014   Ex-smoker 11/18/2015   Family history of breast cancer    Genetic testing 07/16/2017   Negative genetic testing on the multi-cancer panel.  The Multi-Gene Panel offered by [name] includes sequencing and/or deletion duplication testing of the following 83 genes: ALK, APC, ATM, AXIN2,BAP1,  BARD1, BLM, BMPR1A, BRCA1, BRCA2, BRIP1, CASR, CDC73, CDH1, CDK4, CDKN1B, CDKN1C, CDKN2A (p14ARF), CDKN2A (p16INK4a), CEBPA, CHEK2, CTNNA1, DICER1, DIS3L2, EGFR (c.2369C>T, p.Thr790Met variant onl   GERD (gastroesophageal reflux disease)    HEADACHE 12/06/2006   Qualifier: Diagnosis of  By: Arnoldo Morale MD, John E    Hemothorax on left 10/02/2020   History of adenomatous polyp of colon 11/19/2017    Hyperlipidemia    Hypertension    Hypothyroidism    IDA (iron deficiency anemia) 11/06/2019   Impaired fasting glucose 12/12/2017   Insomnia 08/20/2013   Irritable bowel syndrome 11/29/2006   Qualifier: Diagnosis of  By: Arnoldo Morale MD, John E    Left sided abdominal pain 12/13/2017   Liver hemangioma 08/24/2014   Malignant neoplasm of upper-outer quadrant of right breast in female, estrogen receptor positive ([place]) 03/20/2017   Migraines    Mild persistent asthma, uncomplicated 76/73/4193   Nasal polyp, unspecified 01/10/2018   Oral herpes 04/20/2013   Other allergic rhinitis 09/15/2014   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 10/02/2020   Personal history of radiation therapy 2019   36 radiation tx   PONV (postoperative nausea and vomiting)    scopolamine patch helps   Recurrent infections 10/05/2018   Right upper lobe pulmonary nodule 01/10/2018   Symptomatic varicose veins, right 09/02/2015   Ulcerative colitis ([place])    Varicose veins     Past Surgical History:  Procedure Laterality Date   BREAST BIOPSY Right 02/2017   BREAST BIOPSY Right 11/07/2019   BREAST LUMPECTOMY Right 04/24/2017   BREAST LUMPECTOMY WITH RADIOACTIVE SEED AND SENTINEL LYMPH NODE BIOPSY Right 04/24/2017   Procedure: RIGHT BREAST LUMPECTOMY WITH  RADIOACTIVE SEED AND SENTINEL LYMPH NODE BIOPSY;  Surgeon: [name], MD;  Location: [place];  Service: General;  Laterality: Right;   CORONARY ARTERY BYPASS GRAFT     crapal tunnel relase Left 05/2018   ENDOMETRIAL ABLATION  2010   Had abnormal uterine bleeding (heavy and frequent), performed in [place], menses stopped completely 2 years after   EXPLORATORY LAPAROTOMY     Had exploratory surgery at the age of 18 for abdominal pain with cyst found on her left ovary, not treated surgically   FOOT SURGERY Right 2012   GENERATOR REMOVAL  10/02/2020   Procedure: GENERATOR REMOVAL;  Surgeon: Rexene Alberts, MD;  Location: [place];   Service: Thoracic;;   HAND SURGERY Right    ICD IMPLANT N/A 10/01/2020   Procedure: ICD IMPLANT;  Surgeon: Constance Haw, MD;  Location: [place] [facility];  Service: Cardiovascular;  Laterality: N/A;   ICD LEAD REMOVAL N/A 10/02/2020   Procedure: ICD LEAD REMOVAL;  Surgeon: Rexene Alberts, MD;  Location: [place];  Service: Thoracic;  Laterality: N/A;   MEDIASTERNOTOMY N/A 10/02/2020   Procedure: MEDIAN STERNOTOMY REPAIR PERFORATED VENTRICLE EVACUATION LEFT HEMOTHORAX;  Surgeon: Rexene Alberts, MD;  Location: [place];  Service: Thoracic;  Laterality: N/A;   RE-EXCISION OF BREAST LUMPECTOMY Right 05/15/2017   Procedure: RE-EXCISION OF BREAST LUMPECTOMY;  Surgeon: [name], MD;  Location: [place];  Service: General;  Laterality: Right;   RIGHT/LEFT HEART CATH AND CORONARY ANGIOGRAPHY N/A 06/21/2020   Procedure: RIGHT/LEFT HEART CATH AND CORONARY ANGIOGRAPHY;  Surgeon: Nelva Bush, MD;  Location: [place] [facility];  Service: Cardiovascular;  Laterality: N/A;   ROBOTIC ASSISTED SALPINGO OOPHERECTOMY Bilateral 08/29/2019   Procedure: XI ROBOTIC ASSISTED SALPINGO OOPHORECTOMY;  Surgeon: [name], MD;  Location[facility];  Service: Gynecology;  Laterality: Bilateral;   Septoplasty with turbinate reduction     TEE WITHOUT CARDIOVERSION N/A 10/02/2020   Procedure: TRANSESOPHAGEAL ECHOCARDIOGRAM (TEE);  Surgeon: Rexene Alberts, MD;  Location[facility];  Service: Thoracic;  Laterality: N/A;   [place] Right 12/20/2021   Procedure: RIGHT TOTAL HIP ARTHROPLASTY ANTERIOR APPROACH;  Surgeon: [name], MD;  Location: [place];  Service: Orthopedics;  Laterality: Right;   VEIN SURGERY     Removal Right Leg   WISDOM TOOTH EXTRACTION      Social History   Tobacco Use   Smoking status: Former    Packs/day: 3.00    Years: 15.00    Total pack years: 45.00    Types: Cigarettes    Quit date: 09/14/1993     Years since quitting: 28.5   Smokeless tobacco: Never   Tobacco comments:    Quit 26 yrs ago  Vaping Use   Vaping Use: Never used  Substance Use Topics   Alcohol use: Yes    Alcohol/week: 1.0 - 2.0 standard drink of alcohol    Types: 1 - 2 Glasses of wine per week    Comment: occ   Drug use: No    Family History  Problem Relation Age of Onset   Arthritis Mother 23       Deceased   Breast cancer Mother    Lung cancer Mother    Hyperlipidemia Mother    Heart disease Mother    Hypertension Mother    Diabetes Mother    Crohn's disease Mother    Diabetes Maternal Grandfather  Heart disease Maternal Grandfather    Heart attack Maternal Grandfather    Leukemia Maternal Grandfather    Colitis Sister    Leukemia Cousin 3       mat cousin   Lung cancer Father    Brain cancer Father     Allergies  Allergen Reactions   Losartan Potassium Shortness Of Breath   Ace Inhibitors Cough   Aspirin     Uncoated Aspirin upsets stomach   Ceclor [Cefaclor] Hives    Has tolerated Ancef in 2022   Oxycodone Hives   Sulfa Antibiotics Hives   Lisinopril Cough    Medication list has been reviewed and updated.  Current Outpatient Medications on File Prior to Visit  Medication Sig Dispense Refill   acetaminophen (TYLENOL) 500 MG tablet Take 2 tablets (1,000 mg total) by mouth every 6 (six) hours as needed for moderate pain or mild pain. 30 tablet 0   acyclovir (ZOVIRAX) 400 MG tablet TAKE 1 TABLET (400 MG TOTAL) BY MOUTH 3 TIMES A DAY AS NEEDED (Patient taking differently: Take 400 mg by mouth 3 (three) times daily as needed.) 270 tablet 1   Adalimumab 80 MG/0.8ML PNKT Inject 80 mg into the skin every 14 (fourteen) days.     albuterol (VENTOLIN HFA) 108 (90 Base) MCG/ACT inhaler Inhale 2 puffs into the lungs every 6 (six) hours as needed for shortness of breath.     aspirin EC 81 MG tablet Take 1 tablet (81 mg total) by mouth 2 (two) times daily. To prevent blood clots after surgery 60  tablet 0   Azelastine HCl 0.15 % SOLN Place 2 sprays into both nostrils 2 (two) times daily.     carvedilol (COREG) 12.5 MG tablet TAKE 1.5 TABLETS (18.75 MG TOTAL) BY MOUTH 2 (TWO) TIMES DAILY. (Patient taking differently: Take 12.5 mg by mouth 2 (two) times daily with a meal.) 270 tablet 3   cetirizine (ZYRTEC) 10 MG tablet Take 10 mg by mouth daily.     dapagliflozin propanediol (FARXIGA) 10 MG TABS tablet Take 1 tablet (10 mg total) by mouth daily before breakfast. 30 tablet 11   diphenoxylate-atropine (LOMOTIL) 2.5-0.025 MG tablet Take 2 tablets by mouth 4 (four) times daily as needed for diarrhea or loose stools.     EPINEPHrine 0.3 mg/0.3 mL IJ SOAJ injection Inject 0.3 mg into the muscle as needed for anaphylaxis.     exemestane (AROMASIN) 25 MG tablet TAKE 1 TABLET (25 MG TOTAL) BY MOUTH DAILY AFTER BREAKFAST. 90 tablet 3   Hyoscyamine Sulfate 0.375 MG TBCR Take 0.375 mg by mouth 2 (two) times daily as needed (stomach cramping).     ipratropium (ATROVENT) 0.06 % nasal spray Place 2 sprays into both nostrils 4 (four) times daily. Use as needed 15 mL 12   lansoprazole (PREVACID SOLUTAB) 15 MG disintegrating tablet Take 15 mg by mouth 2 (two) times daily before a meal.     levothyroxine (SYNTHROID) 75 MCG tablet TAKE 1 TABLET BY MOUTH DAILY BEFORE BREAKFAST. 90 tablet 0   MAGNESIUM PO Take 250 mg by mouth daily.     Menthol, Topical Analgesic, (BIOFREEZE COLORLESS) 4 % GEL Apply 1 application topically daily as needed (Hip pain).     mesalamine (LIALDA) 1.2 g EC tablet Take 2.4 g by mouth in the morning and at bedtime.      metFORMIN (GLUCOPHAGE) 500 MG tablet TAKE 1 TABLET BY MOUTH 2 TIMES DAILY WITH A MEAL. 180 tablet 3   montelukast (SINGULAIR) 10  MG tablet Take 10 mg by mouth at bedtime.      rosuvastatin (CRESTOR) 20 MG tablet Take 1 tablet (20 mg total) by mouth daily. 90 tablet 0   sacubitril-valsartan (ENTRESTO) 97-103 MG Take 1 tablet by mouth 2 (two) times daily. 180 tablet 1    spironolactone (ALDACTONE) 25 MG tablet Take 1 tablet (25 mg total) by mouth daily. 90 tablet 3   SYMBICORT 160-4.5 MCG/ACT inhaler INHALE 2 PUFFS INTO THE LUNGS TWICE A DAY 30.6 each 1   triamcinolone (NASACORT) 55 MCG/ACT AERO nasal inhaler Place 2 sprays into the nose daily.     venlafaxine XR (EFFEXOR-XR) 150 MG 24 hr capsule TAKE 1 CAPSULE BY MOUTH DAILY WITH BREAKFAST. (Patient taking differently: Take 150 mg by mouth daily with breakfast.) 90 capsule 1   zinc gluconate 50 MG tablet Take 50 mg by mouth daily.     zolpidem (AMBIEN) 5 MG tablet TAKE 1 TABLET BY MOUTH EVERY DAY AT BEDTIME AS NEEDED FOR SLEEP Strength: 5 mg 30 tablet 3   No current facility-administered medications on file prior to visit.    Review of Systems:  As per HPI- otherwise negative.   Physical Examination: Vitals:   04/13/22 1111  BP: 110/60  Pulse: 82  Resp: 18  Temp: 97.8 F (36.6 C)  SpO2: 98%   Vitals:   04/13/22 1111  Weight: 161 lb (73 kg)  Height: 5' 3.5" (1.613 m)   Body mass index is 28.07 kg/m. Ideal Body Weight: Weight in (lb) to have BMI = 25: 143.1  GEN: no acute distress.  Mildly overweight, looks well HEENT: Atraumatic, Normocephalic. Bilateral TM wnl, oropharynx normal.  PEERL,EOMI.   Ears and Nose: No external deformity. CV: RRR, No M/G/R. No JVD. No thrill. No extra heart sounds. PULM: CTA B, no wheezes, crackles, rhonchi. No retractions. No resp. distress. No accessory muscle use. ABD: S, NT, ND, +BS. No rebound. No HSM. EXTR: No c/c/e PSYCH: Normally interactive. Conversant.   Results for orders placed or performed in visit on 04/13/22  POC COVID-19 BinaxNow  Result Value Ref Range   SARS Coronavirus 2 Ag Positive (A) Negative    Assessment and Plan: Acute cough - Plan: POC COVID-19 BinaxNow  Sinus pain - Plan: doxycycline (VIBRAMYCIN) 100 MG capsule  COVID-19 - Plan: nirmatrelvir/ritonavir (PAXLOVID) 20 x 150 MG & 10 x 100MG TABS  Patient seen today with  illness, she tested positive for COVID-19.  She has been sick for about a week, she plans to do hip replacement next week and she is very eager to get this done. We decided to go ahead and treat her with Paxlovid in hopes of getting her to  hip surgery as scheduled. She also notes symptoms suggestive of a sinus infection, will treat her with a course of doxycycline.  She will advise her surgery team that she did test positive for COVID and see what they would like her to do next  Signed Lamar Blinks, MD

## 2022-04-13 ENCOUNTER — Ambulatory Visit: Payer: No Typology Code available for payment source | Admitting: Family Medicine

## 2022-04-13 VITALS — BP 110/60 | HR 82 | Temp 97.8°F | Resp 18 | Ht 63.5 in | Wt 161.0 lb

## 2022-04-13 DIAGNOSIS — R051 Acute cough: Secondary | ICD-10-CM | POA: Diagnosis not present

## 2022-04-13 DIAGNOSIS — J3489 Other specified disorders of nose and nasal sinuses: Secondary | ICD-10-CM

## 2022-04-13 DIAGNOSIS — U071 COVID-19: Secondary | ICD-10-CM | POA: Diagnosis not present

## 2022-04-13 LAB — POC COVID19 BINAXNOW: SARS Coronavirus 2 Ag: POSITIVE — AB

## 2022-04-13 MED ORDER — NIRMATRELVIR/RITONAVIR (PAXLOVID)TABLET: 3.0000 | ORAL_TABLET | Freq: Two times a day (BID) | ORAL | 0 refills | Status: AC

## 2022-04-13 MED ORDER — DOXYCYCLINE HYCLATE 100 MG PO CAPS: 100.0000 mg | ORAL_CAPSULE | Freq: Two times a day (BID) | ORAL | 0 refills | Status: DC

## 2022-04-23 ENCOUNTER — Other Ambulatory Visit: Payer: Self-pay | Admitting: Family Medicine

## 2022-04-23 DIAGNOSIS — E034 Atrophy of thyroid (acquired): Secondary | ICD-10-CM

## 2022-04-30 ENCOUNTER — Other Ambulatory Visit: Payer: Self-pay | Admitting: Family Medicine

## 2022-04-30 DIAGNOSIS — F430 Acute stress reaction: Secondary | ICD-10-CM

## 2022-05-10 NOTE — Pre-Procedure Instructions (Signed)
Surgical Instructions    Your procedure is scheduled on Tuesday 05/23/22.   Report to [facility] Main Entrance "A" at 05:30 A.M., then check in with the Admitting office.  Call this number if you have problems the morning of surgery:  [phone]   If you have any questions prior to your surgery date call [phone]: Open Monday-Friday 8am-4pm If you experience any cold or flu symptoms such as cough, fever, chills, shortness of breath, etc. between now and your scheduled surgery, please notify us at the above number     Remember:  Do not eat after midnight the night before your surgery  You may drink clear liquids until 04:30 the morning of your surgery.   Clear liquids allowed are: Water, Non-Citrus Juices (without pulp), Carbonated Beverages, Clear Tea, Black Coffee ONLY (NO MILK, CREAM OR POWDERED CREAMER of any kind), and Gatorade  Patient Instructions  The night before surgery:  No food after midnight. ONLY clear liquids after midnight.  The day of surgery (if you have diabetes): Drink ONE (1) 12 oz G2 given to you in your pre admission testing appointment by 04:30 A.M. the morning of surgery. Drink in one sitting. Do not sip.  This drink was given to you during your hospital  pre-op appointment visit.  Nothing else to drink after completing the  12 oz bottle of G2.         If you have questions, please contact your surgeon's office.     Take these medicines the morning of surgery with A SIP OF WATER:   carvedilol (COREG)   cetirizine (ZYRTEC)   doxycycline (VIBRAMYCIN)   levothyroxine (SYNTHROID)   mesalamine (LIALDA)   rosuvastatin (CRESTOR)    SYMBICORT   triamcinolone (NASACORT)   venlafaxine XR (EFFEXOR-XR)     Take these medicines if needed:   acetaminophen (TYLENOL)   acyclovir (ZOVIRAX)   albuterol (VENTOLIN HFA)   Azelastine   ipratropium (ATROVENT)    As of today, STOP taking any Aspirin (unless otherwise instructed by your surgeon) Aleve,  Naproxen, Ibuprofen, Motrin, Advil, Goody's, BC's, all herbal medications, fish oil, and all vitamins.  WHAT DO I DO ABOUT MY DIABETES MEDICATION?   Do not take oral diabetes medicines (pills) the morning of surgery.  DO NOT TAKE dapagliflozin propanediol (FARXIGA) three days prior to surgery. Last dose should be taken on Friday 05/19/22.    DO NOT TAKE metFORMIN (GLUCOPHAGE) the morning of surgery.   The day of surgery, do not take other diabetes injectables, including Byetta (exenatide), Bydureon (exenatide ER), Victoza (liraglutide), or Trulicity (dulaglutide).  HOW TO MANAGE YOUR DIABETES BEFORE AND AFTER SURGERY  Why is it important to control my blood sugar before and after surgery? Improving blood sugar levels before and after surgery helps healing and can limit problems. A way of improving blood sugar control is eating a healthy diet by:  Eating less sugar and carbohydrates  Increasing activity/exercise  Talking with your doctor about reaching your blood sugar goals High blood sugars (greater than 180 mg/dL) can raise your risk of infections and slow your recovery, so you will need to focus on controlling your diabetes during the weeks before surgery. Make sure that the doctor who takes care of your diabetes knows about your planned surgery including the date and location.  How do I manage my blood sugar before surgery? Check your blood sugar at least 4 times a day, starting 2 days before surgery, to make sure that the level is not too  high or low.  Check your blood sugar the morning of your surgery when you wake up and every 2 hours until you get to the Short Stay unit.  If your blood sugar is less than 70 mg/dL, you will need to treat for low blood sugar: Do not take insulin. Treat a low blood sugar (less than 70 mg/dL) with  cup of clear juice (cranberry or apple), 4 glucose tablets, OR glucose gel. Recheck blood sugar in 15 minutes after treatment (to make sure it is  greater than 70 mg/dL). If your blood sugar is not greater than 70 mg/dL on recheck, call [phone] for further instructions. Report your blood sugar to the short stay nurse when you get to Short Stay.  If you are admitted to the hospital after surgery: Your blood sugar will be checked by the staff and you will probably be given insulin after surgery (instead of oral diabetes medicines) to make sure you have good blood sugar levels. The goal for blood sugar control after surgery is 80-180 mg/dL.            Do not wear jewelry or makeup. Do not wear lotions, powders, perfumes/cologne or deodorant. Do not shave 48 hours prior to surgery.  Men may shave face and neck. Do not bring valuables to the hospital. Do not wear nail polish, gel polish, artificial nails, or any other type of covering on natural nails (fingers and toes) If you have artificial nails or gel coating that need to be removed by a nail salon, please have this removed prior to surgery. Artificial nails or gel coating may interfere with anesthesia's ability to adequately monitor your vital signs.  [facility] is not responsible for any belongings or valuables.    Do NOT Smoke (Tobacco/Vaping)  24 hours prior to your procedure  If you use a CPAP at night, you may bring your mask for your overnight stay.   Contacts, glasses, hearing aids, dentures or partials may not be worn into surgery, please bring cases for these belongings   For patients admitted to the hospital, discharge time will be determined by your treatment team.   Patients discharged the day of surgery will not be allowed to drive home, and someone needs to stay with them for 24 hours.   SURGICAL WAITING ROOM VISITATION Patients having surgery or a procedure may have no more than 2 support people in the waiting area - these visitors may rotate.   Children under the age of 4 must have an adult with them who is not the patient. If the patient needs to stay at  the hospital during part of their recovery, the visitor guidelines for inpatient rooms apply. Pre-op nurse will coordinate an appropriate time for 1 support person to accompany patient in pre-op.  This support person may not rotate.   Please refer to [email or web address] for the visitor guidelines for Inpatients (after your surgery is over and you are in a regular room).    Special instructions:    Oral Hygiene is also important to reduce your risk of infection.  Remember - BRUSH YOUR TEETH THE MORNING OF SURGERY WITH YOUR REGULAR TOOTHPASTE   [place]- Preparing For Surgery  Before surgery, you can play an important role. Because skin is not sterile, your skin needs to be as free of germs as possible. You can reduce the number of germs on your skin by washing with CHG (chlorahexidine gluconate) Soap before surgery.  CHG is an antiseptic cleaner which  kills germs and bonds with the skin to continue killing germs even after washing.     Please do not use if you have an allergy to CHG or antibacterial soaps. If your skin becomes reddened/irritated stop using the CHG.  Do not shave (including legs and underarms) for at least 48 hours prior to first CHG shower. It is OK to shave your face.  Please follow these instructions carefully.     Shower the NIGHT BEFORE SURGERY and the MORNING OF SURGERY with CHG Soap.   If you chose to wash your hair, wash your hair first as usual with your normal shampoo. After you shampoo, rinse your hair and body thoroughly to remove the shampoo.  Then [employer] and genitals (private parts) with your normal soap and rinse thoroughly to remove soap.  After that Use CHG Soap as you would any other liquid soap. You can apply CHG directly to the skin and wash gently with a scrungie or a clean washcloth.   Apply the CHG Soap to your body ONLY FROM THE NECK DOWN.  Do not use on open wounds or open sores. Avoid contact  with your eyes, ears, mouth and genitals (private parts). Wash Face and genitals (private parts)  with your normal soap.   Wash thoroughly, paying special attention to the area where your surgery will be performed.  Thoroughly rinse your body with warm water from the neck down.  DO NOT shower/wash with your normal soap after using and rinsing off the CHG Soap.  Pat yourself dry with a CLEAN TOWEL.  Wear CLEAN PAJAMAS to bed the night before surgery  Place CLEAN SHEETS on your bed the night before your surgery  DO NOT SLEEP WITH PETS.   Day of Surgery:  Take a shower with CHG soap. Wear Clean/Comfortable clothing the morning of surgery Do not apply any deodorants/lotions.   Remember to brush your teeth WITH YOUR REGULAR TOOTHPASTE.    If you received a COVID test during your pre-op visit, it is requested that you wear a mask when out in public, stay away from anyone that may not be feeling well, and notify your surgeon if you develop symptoms. If you have been in contact with anyone that has tested positive in the last 10 days, please notify your surgeon.    Please read over the following fact sheets that you were given.

## 2022-05-11 ENCOUNTER — Other Ambulatory Visit: Payer: Self-pay

## 2022-05-11 ENCOUNTER — Telehealth: Payer: Self-pay | Admitting: Family Medicine

## 2022-05-11 ENCOUNTER — Encounter (HOSPITAL_COMMUNITY): Payer: Self-pay

## 2022-05-11 ENCOUNTER — Encounter (HOSPITAL_COMMUNITY)
Admission: RE | Admit: 2022-05-11 | Discharge: 2022-05-11 | Disposition: A | Discharge status: Home / Self Care | Payer: No Typology Code available for payment source | Source: Ambulatory Visit | Attending: Orthopedic Surgery | Admitting: Orthopedic Surgery

## 2022-05-11 VITALS — BP 124/75 | HR 88 | Temp 97.6°F | Resp 16 | Ht 63.0 in | Wt 162.0 lb

## 2022-05-11 DIAGNOSIS — Z794 Long term (current) use of insulin: Secondary | ICD-10-CM | POA: Insufficient documentation

## 2022-05-11 DIAGNOSIS — Z01812 Encounter for preprocedural laboratory examination: Secondary | ICD-10-CM | POA: Insufficient documentation

## 2022-05-11 DIAGNOSIS — E119 Type 2 diabetes mellitus without complications: Secondary | ICD-10-CM | POA: Insufficient documentation

## 2022-05-11 DIAGNOSIS — E871 Hypo-osmolality and hyponatremia: Secondary | ICD-10-CM

## 2022-05-11 DIAGNOSIS — Z01818 Encounter for other preprocedural examination: Secondary | ICD-10-CM

## 2022-05-11 LAB — BASIC METABOLIC PANEL
Anion gap: 7 (ref 5–15)
BUN: <5 mg/dL — ABNORMAL LOW (ref 6–20)
CO2: 24 mmol/L (ref 22–32)
Calcium: 9 mg/dL (ref 8.9–10.3)
Chloride: 94 mmol/L — ABNORMAL LOW (ref 98–111)
Creatinine, Ser: 0.63 mg/dL (ref 0.44–1.00)
GFR, Estimated: >60 mL/min (ref >60)
Glucose, Bld: 123 mg/dL — ABNORMAL HIGH (ref 70–99)
Potassium: 4.5 mmol/L (ref 3.5–5.1)
Sodium: 125 mmol/L — ABNORMAL LOW (ref 135–145)

## 2022-05-11 LAB — HEMOGLOBIN A1C
Hgb A1c MFr Bld: 6.6 % — ABNORMAL HIGH (ref 4.8–5.6)
Mean Plasma Glucose: 142.72 mg/dL

## 2022-05-11 LAB — TYPE AND SCREEN
ABO/RH(D): O POS
Antibody Screen: NEGATIVE

## 2022-05-11 LAB — CBC
HCT: 37.5 % (ref 36.0–46.0)
Hemoglobin: 12.4 g/dL (ref 12.0–15.0)
MCH: 28.8 pg (ref 26.0–34.0)
MCHC: 33.1 g/dL (ref 30.0–36.0)
MCV: 87 fL (ref 80.0–100.0)
Platelets: 356 10*3/uL (ref 150–400)
RBC: 4.31 MIL/uL (ref 3.87–5.11)
RDW: 13.6 % (ref 11.5–15.5)
WBC: 5.7 10*3/uL (ref 4.0–10.5)
nRBC: 0 % (ref 0.0–0.2)

## 2022-05-11 LAB — GLUCOSE, CAPILLARY: Glucose-Capillary: 120 mg/dL — ABNORMAL HIGH (ref 70–99)

## 2022-05-11 LAB — SURGICAL PCR SCREEN
MRSA, PCR: NEGATIVE
Staphylococcus aureus: NEGATIVE

## 2022-05-11 NOTE — Telephone Encounter (Signed)
Called patient, she had preop labs done today and sodium was 125.  She states she feels fine, she has been drinking a bit more water than usual.  We will plan to recheck a BMP on Monday as a lab visit only.  If she starts to have any symptoms such as dizziness, mental status change, feeling tired or confused in the meantime she will seek care right away

## 2022-05-11 NOTE — Progress Notes (Addendum)
Notified Dr. Jamison Oka surgery scheduler about Sodium level of 125. Voicemail left-  Also notified Karoline Caldwell, PA of sodium level.

## 2022-05-11 NOTE — Progress Notes (Addendum)
PCP - Janett Billow [place] Cardiologist - dalton [facility]clean and [facility]  PPM/ICD - denies   Chest x-ray - n/a EKG - 03/21/22 Stress Test -denies  ECHO - 03/31/22 Cardiac Cath - 06/21/20  Sleep Study - denies   Fasting Blood Sugar - 110-120 Checks Blood Sugar a few times a week  Continue aspirin through day of surgery per surgeon.   ERAS Protcol -yes PRE-SURGERY Ensure or G2- g2 given at last appt  COVID TEST- positive for covid on 04/13/22   Anesthesia review: yes, cardiac clearance on 03/21/22. Sodium 125 on labs.  Patient denies shortness of breath, fever, cough and chest pain at PAT appointment   All instructions explained to the patient, with a verbal understanding of the material. Patient agrees to go over the instructions while at home for a better understanding. Patient also instructed to self quarantine after being tested for COVID-19. The opportunity to ask questions was provided.

## 2022-05-13 NOTE — Patient Instructions (Incomplete)
Good to see you again today- I will be in touch with your labs asap  I will touch base with cardiology about your coreg dosage Doxycycline and prednisone for your sinus infection   Please monitor your glucose while on the prednisone and let me know if any major changes

## 2022-05-13 NOTE — Progress Notes (Unsigned)
[facility] [employer] [place], [place], [place] [address] [phone]  Date:  05/15/2022   Name:  Erin Fields   DOB:  [birth year]   MRN:  [id number]  PCP:  Darreld Mclean, MD    Chief Complaint: Sinus Problem (Onset one week /Cough, congestion )   History of Present Illness:  Erin Fields is a 57 y.o. very pleasant female patient who presents with the following:  Pt seen today with concern of sinus congestion - however, she was also recently seen for pre-op labs for an upcoming total hip and was found to have significant hyponatremia which we can recheck today I saw her just on 12/28 when she had covid, treated with paxlovid  -history significant for right-sided breast cancer, type 2 diabetes, nonobstructive coronary artery disease, hypertension, hyperlipidemia, end-stage hip arthritis, previous history of cardiomyopathy and ICD placement with right ventricular perforation  Recheck sodium today   She notes she was not feeling great last week- she had some cough and runny nose for about a week  Admits she cut her carvedilol dosage recently as this seems to cause low sodium in her case; per my knowledge there is not generally a connection here but the patient states this has been the case for her before Hip surgery planned for 05/23/22  She is supposed to be taking coreg 12.5 BID; she cut back to once daily for 5 weeks ago.  I will touch base with her heart failure cardiologist, Dr. Aundra Dubin for her   Lab Results  Component Value Date   HGBA1C 6.6 (H) 05/11/2022     Patient Active Problem List   Diagnosis Date Noted   DJD (degenerative joint disease) 12/20/2021   S/P total right hip arthroplasty 12/20/2021   Diabetes mellitus with insulin therapy ([place]) 10/24/2021   Lymphedema of breast 10/24/2021   Breast cancer ([place]) 05/11/2021   Hemothorax on left 10/02/2020   S/P evacuation of hematoma 10/02/2020    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 10/02/2020   Hypovolemic shock ([place]) 10/02/2020   Anxiety    Coronary artery disease 25% of RCA, 20% intermediate branch, 40% diagonal branch based on cardiac cath from 2022 07/22/2020   Dilated cardiomyopathy ([place]) ejection fraction 25% by echocardiogram from January 2022 04/29/2020   Congestive heart failure ([place]) 04/29/2020   Allergic rhinitis due to animal (cat) (dog) hair and dander 04/27/2020   Allergic rhinitis due to pollen 04/27/2020   Mild persistent asthma, uncomplicated 78/24/2353   PONV (postoperative nausea and vomiting)    Migraines    GERD (gastroesophageal reflux disease)    Environmental allergies    Diabetes mellitus without complication ([place])    Depression    Colon polyps    B12 deficiency 11/27/2019   IDA (iron deficiency anemia) 11/06/2019   Asthma, persistent controlled 10/05/2018   Recurrent infections 10/05/2018   Anxiety and depression 01/20/2018   Right upper lobe pulmonary nodule 01/10/2018   Nasal polyp, unspecified 01/10/2018   Left sided abdominal pain 12/13/2017   Impaired fasting glucose 12/12/2017   History of adenomatous polyp of colon 11/19/2017   Genetic testing 07/16/2017   Family history of breast cancer    Controlled type 2 diabetes mellitus without complication, without long-term current use of insulin ([place]) 05/28/2017   Personal history of radiation therapy 2019   Cancer ([place]) 2019   Malignant neoplasm of upper-outer quadrant of right breast in female, estrogen receptor  positive ([place]) 03/20/2017   Crohn disease ([place]) 11/29/2016   Borderline diabetes 12/17/2015   Ex-smoker 11/18/2015   Chronic venous insufficiency 09/02/2015   Symptomatic varicose veins, right 09/02/2015   Chronic sinusitis 01/26/2015   Eustachian tube dysfunction 12/04/2014   Other allergic rhinitis 09/15/2014   Dyspnea 09/15/2014   Liver hemangioma 08/24/2014   Elevated triglycerides with high cholesterol 11/26/2013    Insomnia 08/20/2013   Hypertension 08/20/2013   Ulcerative colitis ([place]) 04/20/2013   Oral herpes 04/20/2013   Hyperlipidemia 12/06/2006   HEADACHE 12/06/2006   CERVICAL CANCER, HX OF 12/06/2006   Hypothyroidism 11/29/2006   Asthma 11/29/2006   IRRITABLE BOWEL SYNDROME 11/29/2006    Past Medical History:  Diagnosis Date   Allergic rhinitis due to animal (cat) (dog) hair and dander 04/27/2020   Allergic rhinitis due to pollen 04/27/2020   Anxiety    Anxiety and depression 01/20/2018   Arthritis    Asthma    Asthma, persistent controlled 10/05/2018   B12 deficiency 11/27/2019   Borderline diabetes 12/17/2015   Breast cancer ([place])    Cancer ([place]) 2019   right breast cancer   CERVICAL CANCER, HX OF 12/06/2006   Qualifier: Diagnosis of  By: Arnoldo Morale MD, John E    Chronic sinusitis 01/26/2015   Chronic venous insufficiency 09/02/2015   Colon polyps    Congestive heart failure ([place]) 04/29/2020   Controlled type 2 diabetes mellitus without complication, without long-term current use of insulin ([place]) 05/28/2017   Coronary artery disease 25% of RCA, 20% intermediate branch, 40% diagonal branch based on cardiac cath from 2022 07/22/2020   Crohn disease ([place]) 11/29/2016   Depression    Diabetes mellitus without complication ([place])    type 2   Dilated cardiomyopathy ([place]) ejection fraction 25% by echocardiogram from January 2022 04/29/2020   Dyspnea 09/15/2014   Elevated triglycerides with high cholesterol 11/26/2013   Environmental allergies    Eustachian tube dysfunction 12/04/2014   Ex-smoker 11/18/2015   Family history of breast cancer    Genetic testing 07/16/2017   Negative genetic testing on the multi-cancer panel.  The Multi-Gene Panel offered by [name] includes sequencing and/or deletion duplication testing of the following 83 genes: ALK, APC, ATM, AXIN2,BAP1,  BARD1, BLM, BMPR1A, BRCA1, BRCA2, BRIP1, CASR, CDC73, CDH1, CDK4, CDKN1B, CDKN1C, CDKN2A (p14ARF), CDKN2A  (p16INK4a), CEBPA, CHEK2, CTNNA1, DICER1, DIS3L2, EGFR (c.2369C>T, p.Thr790Met variant onl   GERD (gastroesophageal reflux disease)    HEADACHE 12/06/2006   Qualifier: Diagnosis of  By: Arnoldo Morale MD, John E    Hemothorax on left 10/02/2020   History of adenomatous polyp of colon 11/19/2017   Hyperlipidemia    Hypertension    Hypothyroidism    IDA (iron deficiency anemia) 11/06/2019   Impaired fasting glucose 12/12/2017   Insomnia 08/20/2013   Irritable bowel syndrome 11/29/2006   Qualifier: Diagnosis of  By: Arnoldo Morale MD, John E    Left sided abdominal pain 12/13/2017   Liver hemangioma 08/24/2014   Malignant neoplasm of upper-outer quadrant of right breast in female, estrogen receptor positive ([place]) 03/20/2017   Migraines    Mild persistent asthma, uncomplicated 58/52/7782   Nasal polyp, unspecified 01/10/2018   Oral herpes 04/20/2013   Other allergic rhinitis 09/15/2014   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 10/02/2020   Personal history of radiation therapy 2019   36 radiation tx   PONV (postoperative nausea and vomiting)    scopolamine patch helps   Recurrent infections 10/05/2018   Right upper lobe pulmonary nodule 01/10/2018  Symptomatic varicose veins, right 09/02/2015   Ulcerative colitis ([place])    Varicose veins     Past Surgical History:  Procedure Laterality Date   BREAST BIOPSY Right 02/2017   BREAST BIOPSY Right 11/07/2019   BREAST LUMPECTOMY Right 04/24/2017   BREAST LUMPECTOMY WITH RADIOACTIVE SEED AND SENTINEL LYMPH NODE BIOPSY Right 04/24/2017   Procedure: RIGHT BREAST LUMPECTOMY WITH RADIOACTIVE SEED AND SENTINEL LYMPH NODE BIOPSY;  Surgeon: [name], MD;  Location: [facility];  Service: General;  Laterality: Right;   CORONARY ARTERY BYPASS GRAFT     crapal tunnel relase Left 05/2018   ENDOMETRIAL ABLATION  2010   Had abnormal uterine bleeding (heavy and frequent), performed in [place], menses stopped completely 2 years  after   EXPLORATORY LAPAROTOMY     Had exploratory surgery at the age of 62 for abdominal pain with cyst found on her left ovary, not treated surgically   FOOT SURGERY Right 2012   GENERATOR REMOVAL  10/02/2020   Procedure: GENERATOR REMOVAL;  Surgeon: Rexene Alberts, MD;  Location: [facility];  Service: Thoracic;;   HAND SURGERY Right    ICD IMPLANT N/A 10/01/2020   Procedure: ICD IMPLANT;  Surgeon: Constance Haw, MD;  Location: [facility];  Service: Cardiovascular;  Laterality: N/A;   ICD LEAD REMOVAL N/A 10/02/2020   Procedure: ICD LEAD REMOVAL;  Surgeon: Rexene Alberts, MD;  Location: [facility];  Service: Thoracic;  Laterality: N/A;   MEDIASTERNOTOMY N/A 10/02/2020   Procedure: MEDIAN STERNOTOMY REPAIR PERFORATED VENTRICLE EVACUATION LEFT HEMOTHORAX;  Surgeon: Rexene Alberts, MD;  Location: [facility];  Service: Thoracic;  Laterality: N/A;   RE-EXCISION OF BREAST LUMPECTOMY Right 05/15/2017   Procedure: RE-EXCISION OF BREAST LUMPECTOMY;  Surgeon: [name], MD;  Location: [facility];  Service: General;  Laterality: Right;   RIGHT/LEFT HEART CATH AND CORONARY ANGIOGRAPHY N/A 06/21/2020   Procedure: RIGHT/LEFT HEART CATH AND CORONARY ANGIOGRAPHY;  Surgeon: Nelva Bush, MD;  Location: [facility];  Service: Cardiovascular;  Laterality: N/A;   ROBOTIC ASSISTED SALPINGO OOPHERECTOMY Bilateral 08/29/2019   Procedure: XI ROBOTIC ASSISTED SALPINGO OOPHORECTOMY;  Surgeon: [name], MD;  Location: [facility];  Service: Gynecology;  Laterality: Bilateral;   Septoplasty with turbinate reduction     TEE WITHOUT CARDIOVERSION N/A 10/02/2020   Procedure: TRANSESOPHAGEAL ECHOCARDIOGRAM (TEE);  Surgeon: Rexene Alberts, MD;  Location: [facility];  Service: Thoracic;  Laterality: N/A;   [place] Right 12/20/2021   Procedure: RIGHT TOTAL HIP ARTHROPLASTY ANTERIOR APPROACH;  Surgeon: [name], MD;   Location: [facility];  Service: Orthopedics;  Laterality: Right;   VEIN SURGERY     Removal Right Leg   WISDOM TOOTH EXTRACTION      Social History   Tobacco Use   Smoking status: Former    Packs/day: 3.00    Years: 15.00    Total pack years: 45.00    Types: Cigarettes    Quit date: 09/14/1993    Years since quitting: 28.6   Smokeless tobacco: Never   Tobacco comments:    Quit 26 yrs ago  Vaping Use   Vaping Use: Never used  Substance Use Topics   Alcohol use: Yes    Alcohol/week: 1.0 - 2.0 standard drink of alcohol    Types: 1 - 2 Glasses of wine per week    Comment: occ   Drug use: No    Family History  Problem Relation Age of Onset   Arthritis Mother 51       Deceased   Breast cancer Mother    Lung cancer Mother    Hyperlipidemia Mother    Heart disease Mother    Hypertension Mother    Diabetes Mother    Crohn's disease Mother    Diabetes Maternal Grandfather    Heart disease Maternal Grandfather    Heart attack Maternal Grandfather    Leukemia Maternal Grandfather    Colitis Sister    Leukemia Cousin 3       mat cousin   Lung cancer Father    Brain cancer Father     Allergies  Allergen Reactions   Losartan Potassium Shortness Of Breath   Ace Inhibitors Cough   Aspirin     Uncoated Aspirin upsets stomach   Ceclor [Cefaclor] Hives    Has tolerated Ancef in 2022   Oxycodone Hives   Sulfa Antibiotics Hives   Lisinopril Cough    Medication list has been reviewed and updated.  Current Outpatient Medications on File Prior to Visit  Medication Sig Dispense Refill   acetaminophen (TYLENOL) 500 MG tablet Take 2 tablets (1,000 mg total) by mouth every 6 (six) hours as needed for moderate pain or mild pain. 30 tablet 0   acyclovir (ZOVIRAX) 400 MG tablet TAKE 1 TABLET (400 MG TOTAL) BY MOUTH 3 TIMES A DAY AS NEEDED (Patient taking differently: Take 400 mg by mouth 3 (three) times daily as needed.) 270 tablet 1   Adalimumab 80 MG/0.8ML PNKT Inject 80 mg into the  skin every 14 (fourteen) days.     albuterol (VENTOLIN HFA) 108 (90 Base) MCG/ACT inhaler Inhale 2 puffs into the lungs every 6 (six) hours as needed for shortness of breath.     aspirin EC 81 MG tablet Take 1 tablet (81 mg total) by mouth 2 (two) times daily. To prevent blood clots after surgery 60 tablet 0   Azelastine HCl 0.15 % SOLN Place 2 sprays into both nostrils 2 (two) times daily.     carvedilol (COREG) 12.5 MG tablet TAKE 1.5 TABLETS (18.75 MG TOTAL) BY MOUTH 2 (TWO) TIMES DAILY. (Patient taking differently: Take 12.5 mg by mouth 2 (two) times daily with a meal.) 270 tablet 3   cetirizine (ZYRTEC) 10 MG tablet Take 10 mg by mouth daily.     dapagliflozin propanediol (FARXIGA) 10 MG TABS tablet Take 1 tablet (10 mg total) by mouth daily before breakfast. 30 tablet 11   diphenoxylate-atropine (LOMOTIL) 2.5-0.025 MG tablet Take 2 tablets by mouth 4 (four) times daily as needed for diarrhea or loose stools.     EPINEPHrine 0.3 mg/0.3 mL IJ SOAJ injection Inject 0.3 mg into the muscle as needed for anaphylaxis.     exemestane (AROMASIN) 25 MG tablet TAKE 1 TABLET (25 MG TOTAL) BY MOUTH DAILY AFTER BREAKFAST. 90 tablet 3   Hyoscyamine Sulfate 0.375 MG TBCR Take 0.375 mg by mouth 2 (two) times daily as needed (stomach cramping).     ipratropium (ATROVENT) 0.06 % nasal spray Place 2 sprays into both nostrils 4 (four) times daily. Use as needed 15 mL 12   lansoprazole (PREVACID SOLUTAB) 15 MG disintegrating tablet Take 15 mg by mouth 2 (two) times daily before a meal.     levothyroxine (SYNTHROID) 75 MCG tablet TAKE 1 TABLET BY MOUTH EVERY DAY BEFORE BREAKFAST 90 tablet 1   MAGNESIUM PO Take 250 mg by mouth daily.     Menthol, Topical  Analgesic, (BIOFREEZE COLORLESS) 4 % GEL Apply 1 application topically daily as needed (Hip pain).     mesalamine (LIALDA) 1.2 g EC tablet Take 2.4 g by mouth in the morning and at bedtime.      metFORMIN (GLUCOPHAGE) 500 MG tablet TAKE 1 TABLET BY MOUTH 2 TIMES  DAILY WITH A MEAL. 180 tablet 3   montelukast (SINGULAIR) 10 MG tablet Take 10 mg by mouth at bedtime.      rosuvastatin (CRESTOR) 20 MG tablet Take 1 tablet (20 mg total) by mouth daily. 90 tablet 0   sacubitril-valsartan (ENTRESTO) 97-103 MG Take 1 tablet by mouth 2 (two) times daily. 180 tablet 1   spironolactone (ALDACTONE) 25 MG tablet Take 1 tablet (25 mg total) by mouth daily. 90 tablet 3   SYMBICORT 160-4.5 MCG/ACT inhaler INHALE 2 PUFFS INTO THE LUNGS TWICE A DAY 30.6 each 1   triamcinolone (NASACORT) 55 MCG/ACT AERO nasal inhaler Place 2 sprays into the nose daily.     venlafaxine XR (EFFEXOR-XR) 150 MG 24 hr capsule TAKE 1 CAPSULE BY MOUTH DAILY WITH BREAKFAST. 90 capsule 1   zinc gluconate 50 MG tablet Take 50 mg by mouth daily.     zolpidem (AMBIEN) 5 MG tablet TAKE 1 TABLET BY MOUTH EVERY DAY AT BEDTIME AS NEEDED FOR SLEEP Strength: 5 mg 30 tablet 3   No current facility-administered medications on file prior to visit.    Review of Systems:  As per HPI- otherwise negative.   Physical Examination: Vitals:   05/15/22 1418  BP: 118/62  Pulse: 85  Resp: 18  Temp: 98.5 F (36.9 C)  SpO2: 95%   Vitals:   05/15/22 1418  Weight: 162 lb 12.8 oz (73.8 kg)  Height: '5\' 3"'$  (1.6 m)   Body mass index is 28.84 kg/m. Ideal Body Weight: Weight in (lb) to have BMI = 25: 140.8  GEN: no acute distress. Overweight, looks well  HEENT: Atraumatic, Normocephalic.  Bilateral TM wnl, oropharynx normal.  PEERL,EOMI.  Nasal cavity with purulent discharge  Ears and Nose: No external deformity. CV: RRR, No M/G/R. No JVD. No thrill. No extra heart sounds. PULM: CTA B, no wheezes, crackles, rhonchi. No retractions. No resp. distress. No accessory muscle use. EXTR: No c/c/e PSYCH: Normally interactive. Conversant.    Assessment and Plan: Hyponatremia - Plan: Basic metabolic panel  Acute non-recurrent frontal sinusitis - Plan: doxycycline (VIBRAMYCIN) 100 MG capsule, predniSONE  (DELTASONE) 20 MG tablet  Diabetes mellitus without complication [facility])  Patient seen today for follow-up.  As above, she went in for preoperative labs prior to hip surgery and was found to have hyponatremia.  Will recheck this for her today For sinusitis we will treat with doxycycline and a short course of prednisone She does have a history of well-controlled diabetes and will monitor her blood sugars while on prednisone I connected with her cardiologist who did ask me to have her start back on carvedilol twice a day Lab Results  Component Value Date   HGBA1C 6.6 (H) 05/11/2022    Signed Lamar Blinks, MD

## 2022-05-15 ENCOUNTER — Ambulatory Visit: Payer: No Typology Code available for payment source | Admitting: Family Medicine

## 2022-05-15 ENCOUNTER — Encounter: Payer: Self-pay | Admitting: Family Medicine

## 2022-05-15 VITALS — BP 118/62 | HR 85 | Temp 98.5°F | Resp 18 | Ht 63.0 in | Wt 162.8 lb

## 2022-05-15 DIAGNOSIS — J0110 Acute frontal sinusitis, unspecified: Secondary | ICD-10-CM

## 2022-05-15 DIAGNOSIS — E871 Hypo-osmolality and hyponatremia: Secondary | ICD-10-CM

## 2022-05-15 DIAGNOSIS — E119 Type 2 diabetes mellitus without complications: Secondary | ICD-10-CM

## 2022-05-15 MED ORDER — PREDNISONE 20 MG PO TABS: ORAL_TABLET | ORAL | 0 refills | Status: DC

## 2022-05-15 MED ORDER — DOXYCYCLINE HYCLATE 100 MG PO CAPS: 100.0000 mg | ORAL_CAPSULE | Freq: Two times a day (BID) | ORAL | 0 refills | Status: DC

## 2022-05-16 ENCOUNTER — Other Ambulatory Visit: Payer: Self-pay | Admitting: Family Medicine

## 2022-05-16 ENCOUNTER — Encounter: Payer: Self-pay | Admitting: Family Medicine

## 2022-05-16 DIAGNOSIS — E782 Mixed hyperlipidemia: Secondary | ICD-10-CM

## 2022-05-16 DIAGNOSIS — E871 Hypo-osmolality and hyponatremia: Secondary | ICD-10-CM

## 2022-05-16 LAB — BASIC METABOLIC PANEL
BUN: 6 mg/dL (ref 6–23)
CO2: 26 mEq/L (ref 19–32)
Calcium: 9.7 mg/dL (ref 8.4–10.5)
Chloride: 97 mEq/L (ref 96–112)
Creatinine, Ser: 0.63 mg/dL (ref 0.40–1.20)
GFR: 97.35 mL/min (ref >60.00)
Glucose, Bld: 161 mg/dL — ABNORMAL HIGH (ref 70–99)
Potassium: 4.7 mEq/L (ref 3.5–5.1)
Sodium: 133 mEq/L — ABNORMAL LOW (ref 135–145)

## 2022-05-19 ENCOUNTER — Other Ambulatory Visit (INDEPENDENT_AMBULATORY_CARE_PROVIDER_SITE_OTHER): Payer: No Typology Code available for payment source

## 2022-05-19 ENCOUNTER — Encounter: Payer: Self-pay | Admitting: Family Medicine

## 2022-05-19 DIAGNOSIS — E871 Hypo-osmolality and hyponatremia: Secondary | ICD-10-CM | POA: Diagnosis not present

## 2022-05-19 LAB — BASIC METABOLIC PANEL
BUN: 8 mg/dL (ref 6–23)
CO2: 23 mEq/L (ref 19–32)
Calcium: 10.1 mg/dL (ref 8.4–10.5)
Chloride: 99 mEq/L (ref 96–112)
Creatinine, Ser: 0.67 mg/dL (ref 0.40–1.20)
GFR: 95.91 mL/min (ref >60.00)
Glucose, Bld: 191 mg/dL — ABNORMAL HIGH (ref 70–99)
Potassium: 4.5 mEq/L (ref 3.5–5.1)
Sodium: 136 mEq/L (ref 135–145)

## 2022-05-23 ENCOUNTER — Ambulatory Visit (HOSPITAL_BASED_OUTPATIENT_CLINIC_OR_DEPARTMENT_OTHER): Payer: No Typology Code available for payment source | Admitting: Vascular Surgery

## 2022-05-23 ENCOUNTER — Ambulatory Visit (HOSPITAL_COMMUNITY): Payer: No Typology Code available for payment source

## 2022-05-23 ENCOUNTER — Other Ambulatory Visit: Payer: Self-pay

## 2022-05-23 ENCOUNTER — Encounter (HOSPITAL_COMMUNITY)
Admission: RE | Disposition: A | Discharge status: Home Health | Payer: Self-pay | Source: Home / Self Care | Attending: Orthopedic Surgery

## 2022-05-23 ENCOUNTER — Ambulatory Visit (HOSPITAL_COMMUNITY): Payer: No Typology Code available for payment source | Admitting: Physician Assistant

## 2022-05-23 ENCOUNTER — Encounter (HOSPITAL_COMMUNITY): Payer: Self-pay | Admitting: Orthopedic Surgery

## 2022-05-23 ENCOUNTER — Observation Stay (HOSPITAL_COMMUNITY)
Admission: RE | Admit: 2022-05-23 | Discharge: 2022-05-24 | Disposition: A | Discharge status: Home Health | Payer: No Typology Code available for payment source | Attending: Orthopedic Surgery | Admitting: Orthopedic Surgery

## 2022-05-23 DIAGNOSIS — Z87891 Personal history of nicotine dependence: Secondary | ICD-10-CM | POA: Insufficient documentation

## 2022-05-23 DIAGNOSIS — Z7982 Long term (current) use of aspirin: Secondary | ICD-10-CM | POA: Insufficient documentation

## 2022-05-23 DIAGNOSIS — I110 Hypertensive heart disease with heart failure: Secondary | ICD-10-CM

## 2022-05-23 DIAGNOSIS — Z8541 Personal history of malignant neoplasm of cervix uteri: Secondary | ICD-10-CM | POA: Diagnosis not present

## 2022-05-23 DIAGNOSIS — I509 Heart failure, unspecified: Secondary | ICD-10-CM | POA: Diagnosis not present

## 2022-05-23 DIAGNOSIS — J45909 Unspecified asthma, uncomplicated: Secondary | ICD-10-CM | POA: Insufficient documentation

## 2022-05-23 DIAGNOSIS — Z951 Presence of aortocoronary bypass graft: Secondary | ICD-10-CM | POA: Insufficient documentation

## 2022-05-23 DIAGNOSIS — Z853 Personal history of malignant neoplasm of breast: Secondary | ICD-10-CM | POA: Insufficient documentation

## 2022-05-23 DIAGNOSIS — Z794 Long term (current) use of insulin: Secondary | ICD-10-CM

## 2022-05-23 DIAGNOSIS — Z23 Encounter for immunization: Secondary | ICD-10-CM | POA: Diagnosis not present

## 2022-05-23 DIAGNOSIS — M1612 Unilateral primary osteoarthritis, left hip: Secondary | ICD-10-CM | POA: Diagnosis not present

## 2022-05-23 DIAGNOSIS — Z7984 Long term (current) use of oral hypoglycemic drugs: Secondary | ICD-10-CM | POA: Diagnosis not present

## 2022-05-23 DIAGNOSIS — E039 Hypothyroidism, unspecified: Secondary | ICD-10-CM | POA: Diagnosis not present

## 2022-05-23 DIAGNOSIS — I2510 Atherosclerotic heart disease of native coronary artery without angina pectoris: Secondary | ICD-10-CM | POA: Diagnosis not present

## 2022-05-23 DIAGNOSIS — Z96641 Presence of right artificial hip joint: Secondary | ICD-10-CM | POA: Insufficient documentation

## 2022-05-23 DIAGNOSIS — Z96642 Presence of left artificial hip joint: Secondary | ICD-10-CM | POA: Diagnosis present

## 2022-05-23 DIAGNOSIS — E119 Type 2 diabetes mellitus without complications: Secondary | ICD-10-CM | POA: Diagnosis not present

## 2022-05-23 DIAGNOSIS — Z79899 Other long term (current) drug therapy: Secondary | ICD-10-CM | POA: Insufficient documentation

## 2022-05-23 DIAGNOSIS — F418 Other specified anxiety disorders: Secondary | ICD-10-CM

## 2022-05-23 HISTORY — PX: TOTAL HIP ARTHROPLASTY: SHX124

## 2022-05-23 LAB — GLUCOSE, CAPILLARY
Glucose-Capillary: 104 mg/dL — ABNORMAL HIGH (ref 70–99)
Glucose-Capillary: 104 mg/dL — ABNORMAL HIGH (ref 70–99)

## 2022-05-23 SURGERY — ARTHROPLASTY, HIP, TOTAL, ANTERIOR APPROACH
Anesthesia: Monitor Anesthesia Care | Site: Hip | Laterality: Left

## 2022-05-23 MED ORDER — POLYETHYLENE GLYCOL 3350 17 G PO PACK: 17.0000 g | PACK | Freq: Every day | ORAL | Status: DC | PRN

## 2022-05-23 MED ORDER — PHENOL 1.4 % MT LIQD: 1.0000 | OROMUCOSAL | Status: DC | PRN

## 2022-05-23 MED ORDER — PNEUMOCOCCAL 20-VAL CONJ VACC 0.5 ML IM SUSY
0.5000 mL | PREFILLED_SYRINGE | INTRAMUSCULAR | Status: AC
  Administered 2022-05-24: 0.5 mL via INTRAMUSCULAR
  Filled 2022-05-23: qty 0.5

## 2022-05-23 MED ORDER — METHOCARBAMOL 500 MG PO TABS
500.0000 mg | ORAL_TABLET | Freq: Four times a day (QID) | ORAL | Status: DC | PRN
  Administered 2022-05-23: 500 mg via ORAL
  Filled 2022-05-23: qty 1

## 2022-05-23 MED ORDER — CEFAZOLIN SODIUM-DEXTROSE 2-4 GM/100ML-% IV SOLN
2.0000 g | INTRAVENOUS | Status: AC
  Administered 2022-05-23: 2 g via INTRAVENOUS
  Filled 2022-05-23: qty 100

## 2022-05-23 MED ORDER — ACETAMINOPHEN 325 MG PO TABS: 325.0000 mg | ORAL_TABLET | Freq: Four times a day (QID) | ORAL | Status: DC | PRN

## 2022-05-23 MED ORDER — METOCLOPRAMIDE HCL 5 MG PO TABS: 5.0000 mg | ORAL_TABLET | Freq: Three times a day (TID) | ORAL | Status: DC | PRN

## 2022-05-23 MED ORDER — MIDAZOLAM HCL 2 MG/2ML IJ SOLN
INTRAMUSCULAR | Status: DC | PRN
  Administered 2022-05-23: 2 mg via INTRAVENOUS

## 2022-05-23 MED ORDER — VENLAFAXINE HCL ER 150 MG PO CP24
150.0000 mg | ORAL_CAPSULE | Freq: Every day | ORAL | Status: DC
  Administered 2022-05-24: 150 mg via ORAL
  Filled 2022-05-23: qty 1

## 2022-05-23 MED ORDER — 0.9 % SODIUM CHLORIDE (POUR BTL) OPTIME
TOPICAL | Status: DC | PRN
  Administered 2022-05-23: 1000 mL

## 2022-05-23 MED ORDER — ALUM & MAG HYDROXIDE-SIMETH 200-200-20 MG/5ML PO SUSP: 30.0000 mL | ORAL | Status: DC | PRN

## 2022-05-23 MED ORDER — BUPIVACAINE LIPOSOME 1.3 % IJ SUSP
INTRAMUSCULAR | Status: AC
  Filled 2022-05-23: qty 20

## 2022-05-23 MED ORDER — DEXAMETHASONE SODIUM PHOSPHATE 10 MG/ML IJ SOLN
8.0000 mg | Freq: Once | INTRAMUSCULAR | Status: AC
  Administered 2022-05-23: 10 mg via INTRAVENOUS
  Filled 2022-05-23: qty 1

## 2022-05-23 MED ORDER — ACETAMINOPHEN 500 MG PO TABS: 1000.0000 mg | ORAL_TABLET | Freq: Once | ORAL | Status: DC

## 2022-05-23 MED ORDER — DEXAMETHASONE SODIUM PHOSPHATE 10 MG/ML IJ SOLN
INTRAMUSCULAR | Status: AC
  Filled 2022-05-23: qty 1

## 2022-05-23 MED ORDER — LEVOTHYROXINE SODIUM 75 MCG PO TABS
75.0000 ug | ORAL_TABLET | Freq: Every day | ORAL | Status: DC
  Administered 2022-05-24: 75 ug via ORAL
  Filled 2022-05-23: qty 1

## 2022-05-23 MED ORDER — ORAL CARE MOUTH RINSE: 15.0000 mL | Freq: Once | OROMUCOSAL | Status: AC

## 2022-05-23 MED ORDER — ACETAMINOPHEN 500 MG PO TABS: 1000.0000 mg | ORAL_TABLET | Freq: Once | ORAL | Status: AC

## 2022-05-23 MED ORDER — POVIDONE-IODINE 10 % EX SWAB
2.0000 | Freq: Once | CUTANEOUS | Status: AC
  Administered 2022-05-23: 2 via TOPICAL

## 2022-05-23 MED ORDER — FENTANYL CITRATE (PF) 250 MCG/5ML IJ SOLN
INTRAMUSCULAR | Status: DC | PRN
  Administered 2022-05-23: 50 ug via INTRAVENOUS

## 2022-05-23 MED ORDER — PROPOFOL 1000 MG/100ML IV EMUL
INTRAVENOUS | Status: AC
  Filled 2022-05-23: qty 100

## 2022-05-23 MED ORDER — TRAMADOL HCL 50 MG PO TABS
50.0000 mg | ORAL_TABLET | Freq: Four times a day (QID) | ORAL | Status: DC
  Administered 2022-05-23 – 2022-05-24 (×5): 50 mg via ORAL
  Filled 2022-05-23 (×5): qty 1

## 2022-05-23 MED ORDER — ZOLPIDEM TARTRATE 5 MG PO TABS: 5.0000 mg | ORAL_TABLET | Freq: Every evening | ORAL | Status: DC | PRN

## 2022-05-23 MED ORDER — SCOPOLAMINE 1 MG/3DAYS TD PT72
1.0000 | MEDICATED_PATCH | TRANSDERMAL | Status: DC
  Administered 2022-05-23: 1.5 mg via TRANSDERMAL
  Filled 2022-05-23 (×2): qty 1

## 2022-05-23 MED ORDER — HYDROMORPHONE HCL 1 MG/ML IJ SOLN
0.5000 mg | INTRAMUSCULAR | Status: DC | PRN
  Administered 2022-05-23 – 2022-05-24 (×2): 1 mg via INTRAVENOUS
  Filled 2022-05-23 (×2): qty 1

## 2022-05-23 MED ORDER — ALBUMIN HUMAN 5 % IV SOLN: INTRAVENOUS | Status: DC | PRN

## 2022-05-23 MED ORDER — SACUBITRIL-VALSARTAN 97-103 MG PO TABS
1.0000 | ORAL_TABLET | Freq: Two times a day (BID) | ORAL | Status: DC
  Administered 2022-05-23 – 2022-05-24 (×3): 1 via ORAL
  Filled 2022-05-23 (×4): qty 1

## 2022-05-23 MED ORDER — EXEMESTANE 25 MG PO TABS
25.0000 mg | ORAL_TABLET | Freq: Every day | ORAL | Status: DC
  Administered 2022-05-24: 25 mg via ORAL
  Filled 2022-05-23: qty 1

## 2022-05-23 MED ORDER — PHENYLEPHRINE 80 MCG/ML (10ML) SYRINGE FOR IV PUSH (FOR BLOOD PRESSURE SUPPORT)
PREFILLED_SYRINGE | INTRAVENOUS | Status: DC | PRN
  Administered 2022-05-23 (×2): 80 ug via INTRAVENOUS
  Administered 2022-05-23: 40 ug via INTRAVENOUS
  Administered 2022-05-23: 80 ug via INTRAVENOUS
  Administered 2022-05-23: 160 ug via INTRAVENOUS
  Administered 2022-05-23: 80 ug via INTRAVENOUS

## 2022-05-23 MED ORDER — ACETAMINOPHEN 500 MG PO TABS
ORAL_TABLET | ORAL | Status: AC
  Administered 2022-05-23: 1000 mg via ORAL
  Filled 2022-05-23: qty 2

## 2022-05-23 MED ORDER — MAGNESIUM CITRATE PO SOLN: 1.0000 | Freq: Once | ORAL | Status: DC | PRN

## 2022-05-23 MED ORDER — MESALAMINE 1.2 G PO TBEC
2.4000 g | DELAYED_RELEASE_TABLET | Freq: Two times a day (BID) | ORAL | Status: DC
  Administered 2022-05-23 – 2022-05-24 (×3): 2.4 g via ORAL
  Filled 2022-05-23 (×4): qty 2

## 2022-05-23 MED ORDER — SPIRONOLACTONE 25 MG PO TABS
25.0000 mg | ORAL_TABLET | Freq: Every day | ORAL | Status: DC
  Administered 2022-05-23 – 2022-05-24 (×2): 25 mg via ORAL
  Filled 2022-05-23 (×2): qty 1

## 2022-05-23 MED ORDER — CHLORHEXIDINE GLUCONATE 0.12 % MT SOLN: 15.0000 mL | Freq: Once | OROMUCOSAL | Status: AC

## 2022-05-23 MED ORDER — ONDANSETRON HCL 4 MG/2ML IJ SOLN
4.0000 mg | Freq: Four times a day (QID) | INTRAMUSCULAR | Status: DC | PRN
  Administered 2022-05-23 (×2): 4 mg via INTRAVENOUS
  Filled 2022-05-23 (×2): qty 2

## 2022-05-23 MED ORDER — DOXYCYCLINE HYCLATE 100 MG PO TABS
100.0000 mg | ORAL_TABLET | Freq: Two times a day (BID) | ORAL | Status: DC
  Administered 2022-05-23 – 2022-05-24 (×3): 100 mg via ORAL
  Filled 2022-05-23 (×3): qty 1

## 2022-05-23 MED ORDER — LACTATED RINGERS IV SOLN: INTRAVENOUS | Status: DC

## 2022-05-23 MED ORDER — LORATADINE 10 MG PO TABS
10.0000 mg | ORAL_TABLET | Freq: Every day | ORAL | Status: DC
  Administered 2022-05-23 – 2022-05-24 (×2): 10 mg via ORAL
  Filled 2022-05-23 (×2): qty 1

## 2022-05-23 MED ORDER — SODIUM CHLORIDE 0.9 % IV SOLN
12.5000 mg | Freq: Three times a day (TID) | INTRAVENOUS | Status: DC | PRN
  Administered 2022-05-23 – 2022-05-24 (×2): 12.5 mg via INTRAVENOUS
  Filled 2022-05-23: qty 12.5
  Filled 2022-05-23: qty 0.5

## 2022-05-23 MED ORDER — TRANEXAMIC ACID-NACL 1000-0.7 MG/100ML-% IV SOLN
1000.0000 mg | INTRAVENOUS | Status: AC
  Administered 2022-05-23: 1000 mg via INTRAVENOUS

## 2022-05-23 MED ORDER — INSULIN ASPART 100 UNIT/ML IJ SOLN: 0.0000 [IU] | INTRAMUSCULAR | Status: DC | PRN

## 2022-05-23 MED ORDER — TRANEXAMIC ACID-NACL 1000-0.7 MG/100ML-% IV SOLN
INTRAVENOUS | Status: AC
  Filled 2022-05-23: qty 100

## 2022-05-23 MED ORDER — ONDANSETRON HCL 4 MG/2ML IJ SOLN
INTRAMUSCULAR | Status: AC
  Filled 2022-05-23: qty 2

## 2022-05-23 MED ORDER — BUPIVACAINE LIPOSOME 1.3 % IJ SUSP: 10.0000 mL | Freq: Once | INTRAMUSCULAR | Status: DC

## 2022-05-23 MED ORDER — METFORMIN HCL 500 MG PO TABS
500.0000 mg | ORAL_TABLET | Freq: Two times a day (BID) | ORAL | Status: DC
  Administered 2022-05-23 – 2022-05-24 (×2): 500 mg via ORAL
  Filled 2022-05-23 (×2): qty 1

## 2022-05-23 MED ORDER — PROMETHAZINE HCL 25 MG/ML IJ SOLN: 6.2500 mg | INTRAMUSCULAR | Status: DC | PRN

## 2022-05-23 MED ORDER — ROSUVASTATIN CALCIUM 20 MG PO TABS
20.0000 mg | ORAL_TABLET | Freq: Every day | ORAL | Status: DC
  Administered 2022-05-23 – 2022-05-24 (×2): 20 mg via ORAL
  Filled 2022-05-23 (×2): qty 1

## 2022-05-23 MED ORDER — MOMETASONE FURO-FORMOTEROL FUM 200-5 MCG/ACT IN AERO
2.0000 | INHALATION_SPRAY | Freq: Two times a day (BID) | RESPIRATORY_TRACT | Status: DC
  Administered 2022-05-23 – 2022-05-24 (×2): 2 via RESPIRATORY_TRACT
  Filled 2022-05-23: qty 8.8

## 2022-05-23 MED ORDER — OXYCODONE HCL 5 MG PO TABS
10.0000 mg | ORAL_TABLET | ORAL | Status: DC | PRN
  Administered 2022-05-23: 10 mg via ORAL
  Filled 2022-05-23: qty 2

## 2022-05-23 MED ORDER — ONDANSETRON HCL 4 MG/2ML IJ SOLN
INTRAMUSCULAR | Status: DC | PRN
  Administered 2022-05-23: 4 mg via INTRAVENOUS

## 2022-05-23 MED ORDER — PROPOFOL 10 MG/ML IV BOLUS
INTRAVENOUS | Status: AC
  Filled 2022-05-23: qty 20

## 2022-05-23 MED ORDER — AZELASTINE HCL 0.1 % NA SOLN
2.0000 | Freq: Two times a day (BID) | NASAL | Status: DC
  Administered 2022-05-23 – 2022-05-24 (×3): 2 via NASAL
  Filled 2022-05-23: qty 30

## 2022-05-23 MED ORDER — CARVEDILOL 12.5 MG PO TABS
12.5000 mg | ORAL_TABLET | Freq: Two times a day (BID) | ORAL | Status: DC
  Administered 2022-05-23 – 2022-05-24 (×2): 12.5 mg via ORAL
  Filled 2022-05-23 (×2): qty 1

## 2022-05-23 MED ORDER — MENTHOL 3 MG MT LOZG: 1.0000 | LOZENGE | OROMUCOSAL | Status: DC | PRN

## 2022-05-23 MED ORDER — DAPAGLIFLOZIN PROPANEDIOL 10 MG PO TABS
10.0000 mg | ORAL_TABLET | Freq: Every day | ORAL | Status: DC
  Administered 2022-05-24: 10 mg via ORAL
  Filled 2022-05-23: qty 1

## 2022-05-23 MED ORDER — DIPHENHYDRAMINE HCL 12.5 MG/5ML PO ELIX: 12.5000 mg | ORAL_SOLUTION | ORAL | Status: DC | PRN

## 2022-05-23 MED ORDER — IPRATROPIUM BROMIDE 0.06 % NA SOLN
2.0000 | Freq: Four times a day (QID) | NASAL | Status: DC
  Administered 2022-05-23 – 2022-05-24 (×5): 2 via NASAL
  Filled 2022-05-23: qty 15

## 2022-05-23 MED ORDER — AMISULPRIDE (ANTIEMETIC) 5 MG/2ML IV SOLN: 10.0000 mg | Freq: Once | INTRAVENOUS | Status: DC | PRN

## 2022-05-23 MED ORDER — OXYCODONE HCL 5 MG PO TABS
5.0000 mg | ORAL_TABLET | ORAL | Status: DC | PRN
  Administered 2022-05-23: 10 mg via ORAL
  Administered 2022-05-24: 5 mg via ORAL
  Administered 2022-05-24: 10 mg via ORAL
  Filled 2022-05-23: qty 1
  Filled 2022-05-23 (×2): qty 2

## 2022-05-23 MED ORDER — BISACODYL 10 MG RE SUPP: 10.0000 mg | Freq: Every day | RECTAL | Status: DC | PRN

## 2022-05-23 MED ORDER — CEFAZOLIN SODIUM-DEXTROSE 1-4 GM/50ML-% IV SOLN
1.0000 g | Freq: Four times a day (QID) | INTRAVENOUS | Status: AC
  Administered 2022-05-23 (×2): 1 g via INTRAVENOUS
  Filled 2022-05-23 (×2): qty 50

## 2022-05-23 MED ORDER — ONDANSETRON HCL 4 MG PO TABS
4.0000 mg | ORAL_TABLET | Freq: Four times a day (QID) | ORAL | Status: DC | PRN
  Administered 2022-05-24: 4 mg via ORAL
  Filled 2022-05-23: qty 1

## 2022-05-23 MED ORDER — TRANEXAMIC ACID-NACL 1000-0.7 MG/100ML-% IV SOLN
1000.0000 mg | Freq: Once | INTRAVENOUS | Status: AC
  Administered 2022-05-23: 1000 mg via INTRAVENOUS
  Filled 2022-05-23: qty 100

## 2022-05-23 MED ORDER — FENTANYL CITRATE (PF) 100 MCG/2ML IJ SOLN
INTRAMUSCULAR | Status: AC
  Filled 2022-05-23: qty 2

## 2022-05-23 MED ORDER — CHLORHEXIDINE GLUCONATE 0.12 % MT SOLN
OROMUCOSAL | Status: AC
  Administered 2022-05-23: 15 mL via OROMUCOSAL
  Filled 2022-05-23: qty 15

## 2022-05-23 MED ORDER — FENTANYL CITRATE (PF) 250 MCG/5ML IJ SOLN
INTRAMUSCULAR | Status: AC
  Filled 2022-05-23: qty 5

## 2022-05-23 MED ORDER — METOCLOPRAMIDE HCL 5 MG/ML IJ SOLN
5.0000 mg | Freq: Three times a day (TID) | INTRAMUSCULAR | Status: DC | PRN
  Administered 2022-05-23: 10 mg via INTRAVENOUS
  Administered 2022-05-23: 5 mg via INTRAVENOUS
  Administered 2022-05-24: 10 mg via INTRAVENOUS
  Filled 2022-05-23 (×3): qty 2

## 2022-05-23 MED ORDER — MONTELUKAST SODIUM 10 MG PO TABS
10.0000 mg | ORAL_TABLET | Freq: Every day | ORAL | Status: DC
  Administered 2022-05-23: 10 mg via ORAL
  Filled 2022-05-23: qty 1

## 2022-05-23 MED ORDER — PANTOPRAZOLE SODIUM 40 MG PO TBEC
40.0000 mg | DELAYED_RELEASE_TABLET | Freq: Every day | ORAL | Status: DC
  Administered 2022-05-23 – 2022-05-24 (×2): 40 mg via ORAL
  Filled 2022-05-23 (×2): qty 1

## 2022-05-23 MED ORDER — ALBUTEROL SULFATE (2.5 MG/3ML) 0.083% IN NEBU: 2.5000 mg | INHALATION_SOLUTION | Freq: Four times a day (QID) | RESPIRATORY_TRACT | Status: DC | PRN

## 2022-05-23 MED ORDER — ASPIRIN 81 MG PO TBEC
81.0000 mg | DELAYED_RELEASE_TABLET | Freq: Two times a day (BID) | ORAL | Status: DC
  Administered 2022-05-23 – 2022-05-24 (×2): 81 mg via ORAL
  Filled 2022-05-23 (×2): qty 1

## 2022-05-23 MED ORDER — PROPOFOL 10 MG/ML IV BOLUS
INTRAVENOUS | Status: DC | PRN
  Administered 2022-05-23: 50 mg via INTRAVENOUS
  Administered 2022-05-23 (×2): 30 mg via INTRAVENOUS
  Administered 2022-05-23: 40 mg via INTRAVENOUS

## 2022-05-23 MED ORDER — BUPIVACAINE IN DEXTROSE 0.75-8.25 % IT SOLN
INTRATHECAL | Status: DC | PRN
  Administered 2022-05-23: 1.8 mL via INTRATHECAL

## 2022-05-23 MED ORDER — ACETAMINOPHEN 500 MG PO TABS
1000.0000 mg | ORAL_TABLET | Freq: Four times a day (QID) | ORAL | Status: AC
  Administered 2022-05-23 – 2022-05-24 (×3): 1000 mg via ORAL
  Filled 2022-05-23 (×3): qty 2

## 2022-05-23 MED ORDER — BUPIVACAINE LIPOSOME 1.3 % IJ SUSP
INTRAMUSCULAR | Status: DC | PRN
  Administered 2022-05-23: 20 mL

## 2022-05-23 MED ORDER — FENTANYL CITRATE (PF) 100 MCG/2ML IJ SOLN
25.0000 ug | INTRAMUSCULAR | Status: DC | PRN
  Administered 2022-05-23 (×2): 50 ug via INTRAVENOUS

## 2022-05-23 MED ORDER — MIDAZOLAM HCL 2 MG/2ML IJ SOLN
INTRAMUSCULAR | Status: AC
  Filled 2022-05-23: qty 2

## 2022-05-23 MED ORDER — POVIDONE-IODINE 10 % EX SWAB: 2.0000 | Freq: Once | CUTANEOUS | Status: DC

## 2022-05-23 MED ORDER — DEXAMETHASONE SODIUM PHOSPHATE 10 MG/ML IJ SOLN
10.0000 mg | Freq: Once | INTRAMUSCULAR | Status: AC
  Administered 2022-05-24: 10 mg via INTRAVENOUS
  Filled 2022-05-23: qty 1

## 2022-05-23 MED ORDER — PHENYLEPHRINE HCL-NACL 20-0.9 MG/250ML-% IV SOLN
INTRAVENOUS | Status: DC | PRN
  Administered 2022-05-23: 75 ug/min via INTRAVENOUS

## 2022-05-23 MED ORDER — PROPOFOL 500 MG/50ML IV EMUL
INTRAVENOUS | Status: DC | PRN
  Administered 2022-05-23: 50 ug/kg/min via INTRAVENOUS

## 2022-05-23 MED ORDER — DOCUSATE SODIUM 100 MG PO CAPS
100.0000 mg | ORAL_CAPSULE | Freq: Two times a day (BID) | ORAL | Status: DC
  Administered 2022-05-23 – 2022-05-24 (×3): 100 mg via ORAL
  Filled 2022-05-23 (×3): qty 1

## 2022-05-23 MED ORDER — SODIUM CHLORIDE FLUSH 0.9 % IV SOLN
INTRAVENOUS | Status: DC | PRN
  Administered 2022-05-23: 20 mL

## 2022-05-23 MED ORDER — METHOCARBAMOL 1000 MG/10ML IJ SOLN: 500.0000 mg | Freq: Four times a day (QID) | INTRAVENOUS | Status: DC | PRN

## 2022-05-23 SURGICAL SUPPLY — 61 items
BAG COUNTER SPONGE SURGICOUNT (BAG) ×1 IMPLANT
BAG SPNG CNTER NS LX DISP (BAG) ×1
BLADE SAG 18X100X1.27 (BLADE) IMPLANT
CLSR STERI-STRIP ANTIMIC 1/2X4 (GAUZE/BANDAGES/DRESSINGS) ×1 IMPLANT
COVER PERINEAL POST (MISCELLANEOUS) ×1 IMPLANT
COVER SURGICAL LIGHT HANDLE (MISCELLANEOUS) ×1 IMPLANT
DRAPE C-ARM 42X72 X-RAY (DRAPES) ×1 IMPLANT
DRAPE STERI IOBAN 125X83 (DRAPES) ×1 IMPLANT
DRAPE U-SHAPE 47X51 STRL (DRAPES) IMPLANT
DRSG MEPILEX BORDER 4X8 (GAUZE/BANDAGES/DRESSINGS) ×1 IMPLANT
DRSG MEPILEX POST OP 4X8 (GAUZE/BANDAGES/DRESSINGS) IMPLANT
DURAPREP 26ML APPLICATOR (WOUND CARE) ×1 IMPLANT
ELECT BLADE 4.0 EZ CLEAN MEGAD (MISCELLANEOUS) ×1
ELECT REM PT RETURN 9FT ADLT (ELECTROSURGICAL) ×1
ELECTRODE BLDE 4.0 EZ CLN MEGD (MISCELLANEOUS) ×1 IMPLANT
ELECTRODE REM PT RTRN 9FT ADLT (ELECTROSURGICAL) ×1 IMPLANT
FACESHIELD WRAPAROUND (MASK) ×2 IMPLANT
FACESHIELD WRAPAROUND OR TEAM (MASK) ×1 IMPLANT
GLOVE BIO SURGEON STRL SZ7.5 (GLOVE) ×1 IMPLANT
GLOVE BIOGEL PI IND STRL 7.5 (GLOVE) ×1 IMPLANT
GLOVE BIOGEL PI IND STRL 8 (GLOVE) ×1 IMPLANT
GLOVE SURG SYN 7.5  E (GLOVE) ×1
GLOVE SURG SYN 7.5 E (GLOVE) ×1 IMPLANT
GLOVE SURG SYN 7.5 PF PI (GLOVE) ×1 IMPLANT
GOWN STRL REUS W/ TWL LRG LVL3 (GOWN DISPOSABLE) ×2 IMPLANT
GOWN STRL REUS W/ TWL XL LVL3 (GOWN DISPOSABLE) ×1 IMPLANT
GOWN STRL REUS W/TWL LRG LVL3 (GOWN DISPOSABLE) ×2
GOWN STRL REUS W/TWL XL LVL3 (GOWN DISPOSABLE) ×1
HEAD BIOLOX HIP 36/-5 (Joint) IMPLANT
HIP BIOLOX HD 36/-5 (Joint) ×1 IMPLANT
INSERT 0 DEGREE 36 (Miscellaneous) IMPLANT
KIT BASIN OR (CUSTOM PROCEDURE TRAY) ×1 IMPLANT
KIT TURNOVER KIT B (KITS) ×1 IMPLANT
MANIFOLD NEPTUNE II (INSTRUMENTS) ×1 IMPLANT
NDL 18GX1X1/2 (RX/OR ONLY) (NEEDLE) IMPLANT
NEEDLE 18GX1X1/2 (RX/OR ONLY) (NEEDLE) IMPLANT
NEEDLE HYPO 22GX1.5 SAFETY (NEEDLE) ×1 IMPLANT
NS IRRIG 1000ML POUR BTL (IV SOLUTION) ×1 IMPLANT
PACK TOTAL JOINT (CUSTOM PROCEDURE TRAY) ×1 IMPLANT
PAD ARMBOARD 7.5X6 YLW CONV (MISCELLANEOUS) ×1 IMPLANT
SCREW HEX LP 6.5X20 (Screw) IMPLANT
SHELL ACETAB TRIDENT 48 (Shell) IMPLANT
SPONGE T-LAP 18X18 ~~LOC~~+RFID (SPONGE) IMPLANT
STEM HIP 4 127DEG (Stem) IMPLANT
SUT MNCRL AB 4-0 PS2 18 (SUTURE) ×1 IMPLANT
SUT STRATAFIX 1PDS 45CM VIOLET (SUTURE) ×1 IMPLANT
SUT VIC AB 0 CT1 27 (SUTURE) ×2
SUT VIC AB 0 CT1 27XBRD ANBCTR (SUTURE) ×2 IMPLANT
SUT VIC AB 1 CT1 27 (SUTURE) ×2
SUT VIC AB 1 CT1 27XBRD ANBCTR (SUTURE) ×2 IMPLANT
SUT VIC AB 2-0 CT1 27 (SUTURE) ×2
SUT VIC AB 2-0 CT1 TAPERPNT 27 (SUTURE) ×2 IMPLANT
SYR 20ML LL LF (SYRINGE) IMPLANT
SYR 50ML LL SCALE MARK (SYRINGE) ×2 IMPLANT
SYR BULB IRRIG 60ML STRL (SYRINGE) ×1 IMPLANT
TOWEL GREEN STERILE (TOWEL DISPOSABLE) ×1 IMPLANT
TOWEL GREEN STERILE FF (TOWEL DISPOSABLE) ×1 IMPLANT
TRAY CATH 16FR W/PLASTIC CATH (SET/KITS/TRAYS/PACK) IMPLANT
TRAY FOLEY MTR SLVR 16FR STAT (SET/KITS/TRAYS/PACK) IMPLANT
WATER STERILE IRR 1000ML POUR (IV SOLUTION) ×1 IMPLANT
YANKAUER SUCT BULB TIP NO VENT (SUCTIONS) ×2 IMPLANT

## 2022-05-23 NOTE — Discharge Instructions (Signed)

## 2022-05-23 NOTE — Anesthesia Procedure Notes (Signed)
Spinal  Patient location during procedure: OR Start time: 05/23/2022 7:47 AM End time: 05/23/2022 7:57 AM Reason for block: surgical anesthesia Staffing Performed: anesthesiologist  Anesthesiologist: Duane Boston, MD Performed by: Duane Boston, MD Authorized by: Duane Boston, MD   Preanesthetic Checklist Completed: patient identified, IV checked, risks and benefits discussed, surgical consent, monitors and equipment checked, pre-op evaluation and timeout performed Spinal Block Patient position: sitting Prep: DuraPrep Patient monitoring: cardiac monitor, continuous pulse ox and blood pressure Approach: midline Location: L2-3 Injection technique: single-shot Needle Needle type: Pencan  Needle gauge: 24 G Needle length: 9 cm Assessment Events: CSF return Additional Notes Functioning IV was confirmed and monitors were applied. Sterile prep and drape, including hand hygiene and sterile gloves were used. The patient was positioned and the spine was prepped. The skin was anesthetized with lidocaine.  Free flow of clear CSF was obtained prior to injecting local anesthetic into the CSF.  The spinal needle aspirated freely following injection.  The needle was carefully withdrawn.  The patient tolerated the procedure well.

## 2022-05-23 NOTE — TOC Initial Note (Signed)
Transition of Care [facility]) - Initial/Assessment Note    Patient Details  Name: Erin Fields MRN: [id number] Date of Birth: 1966-01-04  [facility]) CM/SW Contact:    Carles Collet, RN Phone Number: 05/23/2022, 2:22 PM  Clinical Narrative:                  Damaris Schooner w patient over the phone. She states that she will have support at home after DC. Her son will be available to pick her up from the hospital at DC.  She has all needed DME at home from prior hip replacement a few months ago.  She has been set up prior to admission with [facility]. Agency added to AVS.    Expected Discharge Plan: [place] Barriers to Discharge: Continued Medical Work up   Patient Goals and CMS Choice Patient states their goals for this hospitalization and ongoing recovery are:: to go home          Expected Discharge Plan and Services In-house Referral: Clinical Social Work   Post Acute Care Choice: [place] arrangements for the past 2 months: [place] Agency: [place]        Prior Living Arrangements/Services Living arrangements for the past 2 months: Single Family Home Lives with:: Adult Children                   Activities of Daily Living Home Assistive Devices/Equipment: Eyeglasses ADL Screening (condition at time of admission) Patient's cognitive ability adequate to safely complete daily activities?: Yes Is the patient deaf or have difficulty hearing?: No Does the patient have difficulty seeing, even when wearing glasses/contacts?: No Does the patient have difficulty concentrating, remembering, or making decisions?: No Patient able to express need for assistance with ADLs?: Yes Does the patient have difficulty dressing or bathing?: No Independently performs ADLs?: Yes (appropriate for developmental age) Does the patient have difficulty walking or climbing stairs?: Yes Weakness  of Legs: Left Weakness of Arms/Hands: None  Permission Sought/Granted                  Emotional Assessment              Admission diagnosis:  S/P total left hip arthroplasty [X93.716] Patient Active Problem List   Diagnosis Date Noted   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   DJD (degenerative joint disease) 12/20/2021   S/P total right hip arthroplasty 12/20/2021   Diabetes mellitus with insulin therapy ([place]) 10/24/2021   Lymphedema of breast 10/24/2021   Breast cancer ([place]) 05/11/2021   Hemothorax on left 10/02/2020   S/P evacuation of hematoma 10/02/2020   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 10/02/2020   Hypovolemic shock ([place]) 10/02/2020   Anxiety    Coronary artery disease 25% of RCA, 20% intermediate branch, 40% diagonal branch based on cardiac cath from 2022 07/22/2020   Dilated cardiomyopathy ([place]) ejection fraction 25% by echocardiogram from January 2022 04/29/2020   Congestive heart failure ([place]) 04/29/2020   Allergic rhinitis due to animal (cat) (dog) hair and dander 04/27/2020   Allergic rhinitis due to pollen 04/27/2020   Mild persistent asthma, uncomplicated 96/78/9381   PONV (postoperative nausea and vomiting)    Migraines    GERD (gastroesophageal reflux disease)    Environmental allergies  Diabetes mellitus without complication ([place])    Depression    Colon polyps    B12 deficiency 11/27/2019   IDA (iron deficiency anemia) 11/06/2019   Asthma, persistent controlled 10/05/2018   Recurrent infections 10/05/2018   Anxiety and depression 01/20/2018   Right upper lobe pulmonary nodule 01/10/2018   Nasal polyp, unspecified 01/10/2018   Left sided abdominal pain 12/13/2017   Impaired fasting glucose 12/12/2017   History of adenomatous polyp of colon 11/19/2017   Genetic testing 07/16/2017   Family history of breast cancer    Controlled type 2 diabetes mellitus without complication, without long-term current use of insulin ([place])  05/28/2017   Personal history of radiation therapy 2019   Cancer ([place]) 2019   Malignant neoplasm of upper-outer quadrant of right breast in female, estrogen receptor positive ([place]) 03/20/2017   Crohn disease ([place]) 11/29/2016   Borderline diabetes 12/17/2015   Ex-smoker 11/18/2015   Chronic venous insufficiency 09/02/2015   Symptomatic varicose veins, right 09/02/2015   Chronic sinusitis 01/26/2015   Eustachian tube dysfunction 12/04/2014   Other allergic rhinitis 09/15/2014   Dyspnea 09/15/2014   Liver hemangioma 08/24/2014   Elevated triglycerides with high cholesterol 11/26/2013   Insomnia 08/20/2013   Hypertension 08/20/2013   Ulcerative colitis ([place]) 04/20/2013   Oral herpes 04/20/2013   Hyperlipidemia 12/06/2006   HEADACHE 12/06/2006   CERVICAL CANCER, HX OF 12/06/2006   Hypothyroidism 11/29/2006   Asthma 11/29/2006   IRRITABLE BOWEL SYNDROME 11/29/2006   PCP:  Darreld Mclean, MD Pharmacy:   [id number] [place] [address]Phone: [phone]: [phone] [facility] [address]: [phone]: [phone]    Social Determinants of Health (SDOH) Social History: SDOH Screenings   Food Insecurity: No Food Insecurity (05/23/2022)  Housing: Low Risk  (05/23/2022)  Transportation Needs: No Transportation Needs (05/23/2022)  Utilities: Not At Risk (05/23/2022)  Depression (PHQ2-9): Low Risk  (04/13/2022)  Tobacco Use: Medium Risk (05/23/2022)   SDOH Interventions:     Readmission Risk Interventions    10/07/2020    3:02 PM  Readmission Risk Prevention Plan  Transportation Screening Complete  PCP or Specialist Appt within 3-5 Days Complete  [place] or [place]  Social Work Consult for [place]/Counseling Complete  Palliative Care Screening Not Applicable  Medication Review (Press photographer  Complete

## 2022-05-23 NOTE — Evaluation (Signed)
Physical Therapy Evaluation Patient Details Name: Rozanna Cormany MRN: [id number] DOB: [birth year] Today's Date: 05/23/2022  History of Present Illness  Patient is a 57 y/o female who presents on 2/5 for left THA, direct anterior approach. 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  Clinical Impression  Patient presents with pain and post surgical deficits s/p above surgery. Pt is independent at baseline and lives at home with her son. Today, pt tolerated transfers and short distance ambulation with supervision for safety. Pt had her other hip performed in Sept of 2023 so familiar with procedure and expectations. Reviewed there ex. Mobility distance limited as pt's lunch arrived during session. Encouraged walking later with nursing. Will do very well and likely only need 1 additional session prior to d/c home. Will have support from son. Will follow acutely to maximize independence and mobility prior to return home.       Recommendations for follow up therapy are one component of a multi-disciplinary discharge planning process, led by the attending physician.  Recommendations may be updated based on patient status, additional functional criteria and insurance authorization.  Follow Up Recommendations Follow physician's recommendations for discharge plan and follow up therapies      Assistance Recommended at Discharge PRN  Patient can return home with the following  Assistance with cooking/housework;Assist for transportation    Equipment Recommendations None recommended by PT  Recommendations for Other Services       Functional Status Assessment Patient has had a recent decline in their functional status and demonstrates the ability to make significant improvements in function in a reasonable and predictable amount of time.     Precautions / Restrictions Precautions Precautions:  None Restrictions Weight Bearing Restrictions: Yes LLE Weight Bearing: Weight bearing as tolerated      Mobility  Bed Mobility Overal bed mobility: Modified Independent             General bed mobility comments: Use of rail.    Transfers Overall transfer level: Modified independent Equipment used: Rolling walker (2 wheels)               General transfer comment: Stood from EOB X1 without difficulty, no dizziness.    Ambulation/Gait Ambulation/Gait assistance: Supervision Gait Distance (Feet): 12 Feet (+ 14') Assistive device: Rolling walker (2 wheels) Gait Pattern/deviations: Step-through pattern, Decreased stride length, Decreased stance time - left, Decreased step length - right Gait velocity: decreased Gait velocity interpretation: <1.31 ft/sec, indicative of household ambulator   General Gait Details: Slow, steady gait with reports of heaviness advancing LLE.  Stairs            Wheelchair Mobility    Modified Rankin (Stroke Patients Only)       Balance Overall balance assessment: No apparent balance deficits (not formally assessed)                                           Pertinent Vitals/Pain Pain Assessment Pain Assessment: 0-10 Pain Score: 8  Pain Location: left hip Pain Descriptors / Indicators: Sore, Operative site guarding Pain Intervention(s): Premedicated before session, Monitored during session, Repositioned, Limited activity within patient's tolerance, Ice applied    Home Living Family/patient expects to be discharged to:: Private residence Living Arrangements: Children Available Help at Discharge: Family;Available PRN/intermittently Type of Home: House Home Access:  Stairs to enter Entrance Stairs-Rails: None Entrance Stairs-Number of Steps: 1   Home Layout: One level Home Equipment: Conservation officer, nature (2 wheels);BSC/3in1      Prior Function Prior Level of Function : Independent/Modified  Independent;Driving             Mobility Comments: Independent, works in taxes ADLs Comments: independent     Journalist, newspaper        Extremity/Trunk Assessment   Upper Extremity Assessment Upper Extremity Assessment: Defer to OT evaluation    Lower Extremity Assessment Lower Extremity Assessment: LLE deficits/detail LLE Deficits / Details: post surgical pain/deficits, sensation WFls. No buckling with functional tasks, reports feeling heavy and swollen    Cervical / Trunk Assessment Cervical / Trunk Assessment: Normal  Communication   Communication: No difficulties  Cognition Arousal/Alertness: Awake/alert Behavior During Therapy: WFL for tasks assessed/performed Overall Cognitive Status: Within Functional Limits for tasks assessed                                          General Comments General comments (skin integrity, edema, etc.): bandage- clean dry and intact    Exercises Total Joint Exercises Ankle Circles/Pumps: AROM, Both, 10 reps, Seated Quad Sets: AROM, Both, 5 reps, Supine Long Arc Quad: AROM, Both, 10 reps, Seated   Assessment/Plan    PT Assessment Patient needs continued PT services  PT Problem List Pain;Decreased skin integrity;Decreased mobility       PT Treatment Interventions Functional mobility training;Patient/family education;Therapeutic exercise;Gait training;DME instruction;Therapeutic activities;Modalities    PT Goals (Current goals can be found in the Care Plan section)  Acute Rehab PT Goals Patient Stated Goal: decrease painm return to independence PT Goal Formulation: With patient Time For Goal Achievement: 06/06/22 Potential to Achieve Goals: Good    Frequency 7X/week     Co-evaluation               AM-PAC PT "6 Clicks" Mobility  Outcome Measure Help needed turning from your back to your side while in a flat bed without using bedrails?: None Help needed moving from lying on your back to sitting on  the side of a flat bed without using bedrails?: None Help needed moving to and from a bed to a chair (including a wheelchair)?: None Help needed standing up from a chair using your arms (e.g., wheelchair or bedside chair)?: None Help needed to walk in hospital room?: A Little Help needed climbing 3-5 steps with a railing? : A Little 6 Click Score: 22    End of Session   Activity Tolerance: Patient tolerated treatment well Patient left: in chair;with call bell/phone within reach Nurse Communication: Mobility status PT Visit Diagnosis: Pain Pain - Right/Left: Left Pain - part of body: Hip    Time: 1203-1220 PT Time Calculation (min) (ACUTE ONLY): 17 min   Charges:   PT Evaluation $PT Eval Low Complexity: 1 Low          Marisa Severin, PT, DPT Acute Rehabilitation Services Secure chat preferred Office [place] 05/23/2022, 1:06 PM

## 2022-05-23 NOTE — Interval H&P Note (Signed)
History and Physical Interval Note:  05/23/2022 7:38 AM  Erin Fields  has presented today for surgery, with the diagnosis of OA LEFT HIP.  The various methods of treatment have been discussed with the patient and family. After consideration of risks, benefits and other options for treatment, the patient has consented to  Procedure(s): TOTAL HIP ARTHROPLASTY ANTERIOR APPROACH (Left) as a surgical intervention.  The patient's history has been reviewed, patient examined, no change in status, stable for surgery.  I have reviewed the patient's chart and labs.  Questions were answered to the patient's satisfaction.     [name]

## 2022-05-23 NOTE — TOC CM/SW Note (Deleted)
..   Transition of Care [facility]) Screening Note   Patient Details  Name: Erin Fields Date of Birth: 1965-06-17   Transition of Care [facility]) CM/SW Contact:    Jinger Neighbors, LCSW Phone Number: 05/23/2022, 1:52 PM    Transition of Care Department [facility]) has reviewed patient and no TOC needs have been identified at this time. We will continue to monitor patient advancement through interdisciplinary progression rounds. If new patient transition needs arise, please place a TOC consult.

## 2022-05-23 NOTE — Op Note (Signed)
05/23/2022  9:40 AM  PATIENT:  Erin Fields   MRN: [id number]  PRE-OPERATIVE DIAGNOSIS:  OA LEFT HIP  POST-OPERATIVE DIAGNOSIS:  OA LEFT HIP  PROCEDURE:  Procedure(s): TOTAL HIP ARTHROPLASTY ANTERIOR APPROACH  PREOPERATIVE INDICATIONS:    Erin Fields is an 57 y.o. female who has a diagnosis of <principal problem not specified> and elected for surgical management after failing conservative treatment.  The risks benefits and alternatives were discussed with the patient including but not limited to the risks of nonoperative treatment, versus surgical intervention including infection, bleeding, nerve injury, periprosthetic fracture, the need for revision surgery, dislocation, leg length discrepancy, blood clots, cardiopulmonary complications, morbidity, mortality, among others, and they were willing to proceed.     OPERATIVE REPORT     SURGEON:   [name], MD    ASSISTANT:  Aggie Moats, PA-C, she was present and scrubbed throughout the case, critical for completion in a timely fashion, and for retraction, instrumentation, and closure.     ANESTHESIA:  General    COMPLICATIONS:  None.     COMPONENTS:  Stryker acolade fit femur size 4 with a 36 mm -5 head ball and an acetabular shell size 48 with a  polyethylene liner    PROCEDURE IN DETAIL:   The patient was met in the holding area and  identified.  The appropriate hip was identified and marked at the operative site.  The patient was then transported to the OR  and  placed under anesthesia per that record.  At that point, the patient was  placed in the supine position and  secured to the operating room table and all bony prominences padded. He received pre-operative antibiotics    The operative lower extremity was prepped from the iliac crest to the distal leg.  Sterile draping was performed.  Time out was performed prior to incision.      Skin incision was made just 2 cm lateral to the ASIS  extending in line with  the tensor fascia lata. Electrocautery was used to control all bleeders. I dissected down sharply to the fascia of the tensor fascia lata was confirmed that the muscle fibers beneath were running posteriorly. I then incised the fascia over the superficial tensor fascia lata in line with the incision. The fascia was elevated off the anterior aspect of the muscle the muscle was retracted posteriorly and protected throughout the case. I then used electrocautery to incise the tensor fascia lata fascia control and all bleeders. Immediately visible was the fat over top of the anterior neck and capsule.  I removed the anterior fat from the capsule and elevated the rectus muscle off of the anterior capsule. I then removed a large time of capsule. The retractors were then placed over the anterior acetabulum as well as around the superior and inferior neck.  I then made a femoral neck cut. Then used the power corkscrew to remove the femoral head from the acetabulum and thoroughly irrigated the acetabulum. I sized the femoral head.    I then exposed the deep acetabulum, cleared out any tissue including the ligamentum teres.   After adequate visualization, I excised the labrum, and then sequentially reamed.  I then impacted the acetabular implant into place using fluoroscopy for guidance.  Appropriate version and inclination was confirmed clinically matching their bony anatomy, and with fluoroscopy.  I placed a 20 mm screw in the posterior/superio position with an excellent bite.    I then placed the polyethylene liner in  place  I then adducted the leg and released the external rotators from the posterior femur allowing it to be easily delivered up lateral and anterior to the acetabulum for preparation of the femoral canal.    I then prepared the proximal femur using the cookie-cutter and then sequentially reamed and broached.  A trial broach, neck, and head was utilized, and I reduced the hip and used  floroscopy to assess the neck length and femoral implant.  I then impacted the femoral prosthesis into place into the appropriate version. The hip was then reduced and fluoroscopy confirmed appropriate position. Leg lengths were restored.  I then irrigated the hip copiously again with, and repaired the fascia with Vicryl, followed by monocryl for the subcutaneous tissue, Monocryl for the skin, Steri-Strips and sterile gauze. The patient was then awakened and returned to PACU in stable and satisfactory condition. There were no complications.  POST OPERATIVE PLAN: WBAT, DVT px: SCD's/TED, ambulation and chemical dvt px  Edmonia Lynch, MD Orthopedic Surgeon [phone]

## 2022-05-23 NOTE — Transfer of Care (Signed)
Immediate Anesthesia Transfer of Care Note  Patient: Erin Fields  Procedure(s) Performed: TOTAL HIP ARTHROPLASTY ANTERIOR APPROACH (Left: Hip)  Patient Location: PACU  Anesthesia Type:Spinal and MAC combined with regional for post-op pain  Level of Consciousness: awake, alert , oriented, and patient cooperative  Airway & Oxygen Therapy: Patient Spontanous Breathing and Patient connected to nasal cannula oxygen  Post-op Assessment: Report given to RN and Post -op Vital signs reviewed and stable  Post vital signs: Reviewed and stable  Last Vitals:  Vitals Value Taken Time  BP 114/72 05/23/22 0954  Temp    Pulse 78 05/23/22 0956  Resp 18 05/23/22 0956  SpO2 100 % 05/23/22 0956  Vitals shown include unvalidated device data.  Last Pain:  Vitals:   05/23/22 0625  TempSrc:   PainSc: 2       Patients Stated Pain Goal: 0 (36/72/55 0016)  Complications: No notable events documented.

## 2022-05-23 NOTE — Anesthesia Postprocedure Evaluation (Signed)
Anesthesia Post Note  Patient: Erin Fields  Procedure(s) Performed: TOTAL HIP ARTHROPLASTY ANTERIOR APPROACH (Left: Hip)     Patient location during evaluation: PACU Anesthesia Type: Spinal and MAC Level of consciousness: awake and alert Pain management: pain level controlled Vital Signs Assessment: post-procedure vital signs reviewed and stable Respiratory status: spontaneous breathing and respiratory function stable Cardiovascular status: blood pressure returned to baseline and stable Postop Assessment: spinal receding Anesthetic complications: no   No notable events documented.  Last Vitals:  Vitals:   05/23/22 1100 05/23/22 1126  BP: 137/69 116/71  Pulse: 82 85  Resp: 15 17  Temp: 36.4 C 36.6 C  SpO2: 94% 96%    Last Pain:  Vitals:   05/23/22 1212  TempSrc:   PainSc: 8                  Dameir Gentzler DANIEL

## 2022-05-23 NOTE — TOC CM/SW Note (Signed)
CSW reviewed pt's chart. Pt from home and inpatient for med-surg due to left hip arthroplasty. Dr. Percell Miller recommends [facility] at [facility] at time of discharge.

## 2022-05-23 NOTE — Anesthesia Procedure Notes (Signed)
Procedure Name: MAC Date/Time: 05/23/2022 7:47 AM  Performed by: Michele Rockers, CRNAPre-anesthesia Checklist: Patient identified, Emergency Drugs available, Suction available, Timeout performed and Patient being monitored Patient Re-evaluated:Patient Re-evaluated prior to induction Oxygen Delivery Method: Simple face mask

## 2022-05-24 ENCOUNTER — Encounter: Payer: Self-pay | Admitting: Family Medicine

## 2022-05-24 DIAGNOSIS — M1612 Unilateral primary osteoarthritis, left hip: Secondary | ICD-10-CM | POA: Diagnosis not present

## 2022-05-24 LAB — GLUCOSE, CAPILLARY: Glucose-Capillary: 160 mg/dL — ABNORMAL HIGH (ref 70–99)

## 2022-05-24 MED ORDER — PROMETHAZINE HCL 25 MG PO TABS
12.5000 mg | ORAL_TABLET | Freq: Four times a day (QID) | ORAL | Status: DC | PRN
  Administered 2022-05-24: 12.5 mg via ORAL
  Filled 2022-05-24: qty 1

## 2022-05-24 MED ORDER — METHOCARBAMOL 750 MG PO TABS: 750.0000 mg | ORAL_TABLET | Freq: Three times a day (TID) | ORAL | 0 refills | Status: DC | PRN

## 2022-05-24 MED ORDER — PROMETHAZINE HCL 12.5 MG PO TABS: 12.5000 mg | ORAL_TABLET | Freq: Four times a day (QID) | ORAL | 0 refills | Status: DC | PRN

## 2022-05-24 MED ORDER — OXYCODONE HCL 5 MG PO TABS: 5.0000 mg | ORAL_TABLET | ORAL | 0 refills | Status: DC | PRN

## 2022-05-24 MED ORDER — ASPIRIN 81 MG PO TBEC: 81.0000 mg | DELAYED_RELEASE_TABLET | Freq: Two times a day (BID) | ORAL | 0 refills | Status: DC

## 2022-05-24 MED ORDER — DOCUSATE SODIUM 100 MG PO CAPS: 100.0000 mg | ORAL_CAPSULE | Freq: Two times a day (BID) | ORAL | 0 refills | Status: AC | PRN

## 2022-05-24 MED ORDER — MECLIZINE HCL 12.5 MG PO TABS
12.5000 mg | ORAL_TABLET | Freq: Two times a day (BID) | ORAL | Status: DC
  Administered 2022-05-24: 12.5 mg via ORAL
  Filled 2022-05-24 (×2): qty 1

## 2022-05-24 NOTE — TOC Transition Note (Signed)
Transition of Care [facility]) - CM/SW Discharge Note   Patient Details  Name: Erin Fields MRN: [id number] Date of Birth: 1966-03-25  Transition of [facility]) CM/SW Contact:  Cyndi Bender, RN Phone Number: 05/24/2022, 3:40 PM   Clinical Narrative:     Patient stable for discharge.  [name] with [place] notified of discharge.  Patient has all needed DME.  No other TOC needs at this time.   Final next level of care: [place] Barriers to Discharge: Barriers Resolved   Patient Goals and CMS Choice      Discharge Placement                         Discharge Plan and Services Additional resources added to the After Visit Summary for   In-house Referral: Clinical Social Work   Post Acute Care Choice: [place] Agency: [place]        Social Determinants of Health (SDOH) Interventions SDOH Screenings   Food Insecurity: No Food Insecurity (05/23/2022)  Housing: Low Risk  (05/23/2022)  Transportation Needs: No Transportation Needs (05/23/2022)  Utilities: Not At Risk (05/23/2022)  Depression (PHQ2-9): Low Risk  (04/13/2022)  Tobacco Use: Medium Risk (05/23/2022)     Readmission Risk Interventions    10/07/2020    3:02 PM  Readmission Risk Prevention Plan  Transportation Screening Complete  PCP or Specialist Appt within 3-5 Days Complete  HRI or [place] Complete  Social Work Consult for [place] Planning/Counseling Complete  Palliative Care Screening Not Applicable  Medication Review Press photographer) Complete

## 2022-05-24 NOTE — Discharge Summary (Signed)
Physician Discharge Summary  Patient ID: Erin Fields MRN: [id number] DOB/AGE: [birth year] 57 y.o.  Admit date: 05/23/2022 Discharge date: 05/24/2022  Admission Diagnoses: left hip OA  Discharge Diagnoses:  Principal Problem:   S/P total left hip arthroplasty   Discharged Condition: fair  Hospital Course: Patient underwent a left THA AA by Dr. Percell Miller on [id number] without complications. She spent the night in observation for pain control and mobilization. She has passed her PT evaluation and is ready for discharge home with HHPT set up already.  Consults: None  Significant Diagnostic Studies: n/a  Treatments: IV hydration, antibiotics: Ancef, analgesia: acetaminophen, Dilaudid, and Oxycodone, anticoagulation: ASA, therapies: PT and SW, and surgery: left THA  Discharge Exam: Blood pressure 108/66, pulse 99, temperature 98.1 F (36.7 C), temperature source Oral, resp. rate 17, height 5' 3"$  (1.6 m), weight 73.5 kg, SpO2 91 %. General appearance: alert, cooperative, and no distress Head: Normocephalic, without obvious abnormality, atraumatic Resp: clear to auscultation bilaterally Cardio: regular rate and rhythm, S1, S2 normal, no murmur, click, rub or gallop Extremities: extremities normal, atraumatic, no cyanosis or edema Pulses:  L brachial 2+ R brachial 2+  L radial 2+ R radial 2+  L inguinal 2+ R inguinal 2+  L popliteal 2+ R popliteal 2+  L posterior tibial 2+ R posterior tibial 2+  L dorsalis pedis 2+ R dorsalis pedis 2+   Neurologic: Grossly normal Incision/Wound: c/d/i  Disposition: Discharge disposition: 01-Home or Self Care       Discharge Instructions     Call MD / Call 911   Complete by: As directed    If you experience chest pain or shortness of breath, CALL 911 and be transported to the hospital emergency room.  If you develope a fever above 101 F, pus (white drainage) or increased drainage or redness at the wound, or calf pain, call your surgeon's office.    Diet - low sodium heart healthy   Complete by: As directed    Discharge instructions   Complete by: As directed    You may bear weight as tolerated. Keep your dressing on and dry until follow up. Take medicine to prevent blood clots as directed. Take pain medicine as needed with the goal of transitioning to over the counter medicines.    INSTRUCTIONS AFTER JOINT REPLACEMENT   Remove items at home which could result in a fall. This includes throw rugs or furniture in walking pathways ICE to the affected joint every three hours while awake for 30 minutes at a time, for at least the first 3-5 days, and then as needed for pain and swelling.  Continue to use ice for pain and swelling. You may notice swelling that will progress down to the foot and ankle.  This is normal after surgery.  Elevate your leg when you are not up walking on it.   Continue to use the breathing machine you got in the hospital (incentive spirometer) which will help keep your temperature down.  It is common for your temperature to cycle up and down following surgery, especially at night when you are not up moving around and exerting yourself.  The breathing machine keeps your lungs expanded and your temperature down.   DIET:  As you were doing prior to hospitalization, we recommend a well-balanced diet.  DRESSING / WOUND CARE / SHOWERING  You may shower 3 days after surgery, but keep the wounds dry during showering.  You may use an occlusive plastic wrap (Press'n Seal for example)  with blue painter's tape at edges, NO SOAKING/SUBMERGING IN THE BATHTUB.  If the bandage gets wet, call the office.   ACTIVITY  Increase activity slowly as tolerated, but follow the weight bearing instructions below.   No driving for 6 weeks or until further direction given by your physician.  You cannot drive while taking narcotics.  No lifting or carrying greater than 10 lbs. until further directed by your surgeon. Avoid periods of  inactivity such as sitting longer than an hour when not asleep. This helps prevent blood clots.  You may return to work once you are authorized by your doctor.    WEIGHT BEARING   Weight bearing as tolerated with assist device (walker, cane, etc) as directed, use it as long as suggested by your surgeon or therapist, typically at least 4-6 weeks.   EXERCISES  Results after joint replacement surgery are often greatly improved when you follow the exercise, range of motion and muscle strengthening exercises prescribed by your doctor. Safety measures are also important to protect the joint from further injury. Any time any of these exercises cause you to have increased pain or swelling, decrease what you are doing until you are comfortable again and then slowly increase them. If you have problems or questions, call your caregiver or physical therapist for advice.   Rehabilitation is important following a joint replacement. After just a few days of immobilization, the muscles of the leg can become weakened and shrink (atrophy).  These exercises are designed to build up the tone and strength of the thigh and leg muscles and to improve motion. Often times heat used for twenty to thirty minutes before working out will loosen up your tissues and help with improving the range of motion but do not use heat for the first two weeks following surgery (sometimes heat can increase post-operative swelling).   These exercises can be done on a training (exercise) mat, on the floor, on a table or on a bed. Use whatever works the best and is most comfortable for you.    Use music or television while you are exercising so that the exercises are a pleasant break in your day. This will make your life better with the exercises acting as a break in your routine that you can look forward to.   Perform all exercises about fifteen times, three times per day or as directed.  You should exercise both the operative leg and the other  leg as well.  Exercises include:   Quad Sets - Tighten up the muscle on the front of the thigh (Quad) and hold for 5-10 seconds.   Straight Leg Raises - With your knee straight (if you were given a brace, keep it on), lift the leg to 60 degrees, hold for 3 seconds, and slowly lower the leg.  Perform this exercise against resistance later as your leg gets stronger.  Leg Slides: Lying on your back, slowly slide your foot toward your buttocks, bending your knee up off the floor (only go as far as is comfortable). Then slowly slide your foot back down until your leg is flat on the floor again.  Angel Wings: Lying on your back spread your legs to the side as far apart as you can without causing discomfort.  Hamstring Strength:  Lying on your back, push your heel against the floor with your leg straight by tightening up the muscles of your buttocks.  Repeat, but this time bend your knee to a comfortable angle, and push  your heel against the floor.  You may put a pillow under the heel to make it more comfortable if necessary.   A rehabilitation program following joint replacement surgery can speed recovery and prevent re-injury in the future due to weakened muscles. Contact your doctor or a physical therapist for more information on knee rehabilitation.    CONSTIPATION  Constipation is defined medically as fewer than three stools per week and severe constipation as less than one stool per week.  Even if you have a regular bowel pattern at home, your normal regimen is likely to be disrupted due to multiple reasons following surgery.  Combination of anesthesia, postoperative narcotics, change in appetite and fluid intake all can affect your bowels.   YOU MUST use at least one of the following options; they are listed in order of increasing strength to get the job done.  They are all available over the counter, and you may need to use some, POSSIBLY even all of these options:    Drink plenty of fluids  (prune juice may be helpful) and high fiber foods Colace 100 mg by mouth twice a day  Senokot for constipation as directed and as needed Dulcolax (bisacodyl), take with full glass of water  Miralax (polyethylene glycol) once or twice a day as needed.  If you have tried all these things and are unable to have a bowel movement in the first 3-4 days after surgery call either your surgeon or your primary doctor.    If you experience loose stools or diarrhea, hold the medications until you stool forms back up.  If your symptoms do not get better within 1 week or if they get worse, check with your doctor.  If you experience "the worst abdominal pain ever" or develop nausea or vomiting, please contact the office immediately for further recommendations for treatment.   ITCHING:  If you experience itching with your medications, try taking only a single pain pill, or even half a pain pill at a time.  You can also use Benadryl over the counter for itching or also to help with sleep.   TED HOSE STOCKINGS:  Use stockings on both legs until for at least 2 weeks or as directed by physician office. They may be removed at night for sleeping.  MEDICATIONS:  See your medication summary on the "After Visit Summary" that nursing will review with you.  You may have some home medications which will be placed on hold until you complete the course of blood thinner medication.  It is important for you to complete the blood thinner medication as prescribed.  Take medicines as prescribed.   You have several different medicines that work in different ways. - Tylenol is for mild to moderate pain. Try to take this medicine before turning to your narcotic medicines.  - Robaxin is for muscle spasms. This medicine can make you drowsy. - Oxycodone is a narcotic pain medicine.  Take this for severe pain. This medicine can be dehydrating / constipating. - Promethazine is for nausea and vomiting. - Colace is for constipation  prevention.  - Aspirin is to prevent blood clots after surgery. YOU MUST TAKE THIS MEDICINE!  PRECAUTIONS:  If you experience chest pain or shortness of breath - call 911 immediately for transfer to the hospital emergency department.   If you develop a fever greater that 101 F, purulent drainage from wound, increased redness or drainage from wound, foul odor from the wound/dressing, or calf pain - CONTACT YOUR SURGEON.  FOLLOW-UP APPOINTMENTS:  If you do not already have a post-op appointment, please call the office [phone] for an appointment to be seen by Dr. Percell Miller in 2 weeks.   OTHER INSTRUCTIONS:   MAKE SURE YOU:  Understand these instructions.  Get help right away if you are not doing well or get worse.    Thank you for letting us be a part of your medical care team.  It is a privilege we respect greatly.  We hope these instructions will help you stay on track for a fast and full recovery!   Driving restrictions   Complete by: As directed    No driving for 2-6 weeks   Post-operative opioid taper instructions:   Complete by: As directed    POST-OPERATIVE OPIOID TAPER INSTRUCTIONS: It is important to wean off of your opioid medication as soon as possible. If you do not need pain medication after your surgery it is ok to stop day one. Opioids include: Codeine, Hydrocodone(Norco, Vicodin), Oxycodone(Percocet, oxycontin) and hydromorphone amongst others.  Long term and even short term use of opiods can cause: Increased pain response Dependence Constipation Depression Respiratory depression And more.  Withdrawal symptoms can include Flu like symptoms Nausea, vomiting And more Techniques to manage these symptoms Hydrate well Eat regular healthy meals Stay active Use relaxation techniques(deep breathing, meditating, yoga) Do Not substitute Alcohol to help with tapering If you have been on opioids for less than two weeks and  do not have pain than it is ok to stop all together.  Plan to wean off of opioids This plan should start within one week post op of your joint replacement. Maintain the same interval or time between taking each dose and first decrease the dose.  Cut the total daily intake of opioids by one tablet each day Next start to increase the time between doses. The last dose that should be eliminated is the evening dose.      TED hose   Complete by: As directed    Use stockings (TED hose) for 2 weeks on left leg(s).  You may remove them at night for sleeping.   Weight bearing as tolerated   Complete by: As directed       Allergies as of 05/24/2022       Reactions   Losartan Potassium Shortness Of Breath   Ace Inhibitors Cough   Aspirin    Uncoated Aspirin upsets stomach   Ceclor [cefaclor] Hives   Has tolerated Ancef in 2022   Sulfa Antibiotics Hives   Lisinopril Cough        Medication List     TAKE these medications    acetaminophen 500 MG tablet Commonly known as: TYLENOL Take 2 tablets (1,000 mg total) by mouth every 6 (six) hours as needed for moderate pain or mild pain.   acyclovir 400 MG tablet Commonly known as: ZOVIRAX TAKE 1 TABLET (400 MG TOTAL) BY MOUTH 3 TIMES A DAY AS NEEDED What changed: See the new instructions.   Adalimumab 80 MG/0.8ML Pnkt Inject 80 mg into the skin every 14 (fourteen) days.   albuterol 108 (90 Base) MCG/ACT inhaler Commonly known as: VENTOLIN HFA Inhale 2 puffs into the lungs every 6 (six) hours as needed for shortness of breath.   aspirin EC 81 MG tablet Take 1 tablet (81 mg total) by mouth 2 (two) times daily. To prevent blood clots for 30 days after surgery. What changed: additional instructions   Azelastine HCl 0.15 % Soln Place 2 sprays  into both nostrils 2 (two) times daily.   Biofreeze Colorless 4 % Gel Generic drug: Menthol (Topical Analgesic) Apply 1 application topically daily as needed (Hip pain).   carvedilol 12.5 MG  tablet Commonly known as: COREG TAKE 1.5 TABLETS (18.75 MG TOTAL) BY MOUTH 2 (TWO) TIMES DAILY. What changed: See the new instructions.   cetirizine 10 MG tablet Commonly known as: ZYRTEC Take 10 mg by mouth daily.   dapagliflozin propanediol 10 MG Tabs tablet Commonly known as: FARXIGA Take 1 tablet (10 mg total) by mouth daily before breakfast.   diphenoxylate-atropine 2.5-0.025 MG tablet Commonly known as: LOMOTIL Take 2 tablets by mouth 4 (four) times daily as needed for diarrhea or loose stools.   docusate sodium 100 MG capsule Commonly known as: Colace Take 1 capsule (100 mg total) by mouth 2 (two) times daily as needed for mild constipation or moderate constipation.   doxycycline 100 MG capsule Commonly known as: VIBRAMYCIN Take 1 capsule (100 mg total) by mouth 2 (two) times daily.   Entresto 97-103 MG Generic drug: sacubitril-valsartan Take 1 tablet by mouth 2 (two) times daily.   EPINEPHrine 0.3 mg/0.3 mL Soaj injection Commonly known as: EPI-PEN Inject 0.3 mg into the muscle as needed for anaphylaxis.   exemestane 25 MG tablet Commonly known as: AROMASIN TAKE 1 TABLET (25 MG TOTAL) BY MOUTH DAILY AFTER BREAKFAST.   Hyoscyamine Sulfate 0.375 MG Tbcr Take 0.375 mg by mouth 2 (two) times daily as needed (stomach cramping).   ipratropium 0.06 % nasal spray Commonly known as: ATROVENT Place 2 sprays into both nostrils 4 (four) times daily. Use as needed   lansoprazole 15 MG disintegrating tablet Commonly known as: PREVACID SOLUTAB Take 15 mg by mouth 2 (two) times daily before a meal.   levothyroxine 75 MCG tablet Commonly known as: SYNTHROID TAKE 1 TABLET BY MOUTH EVERY DAY BEFORE BREAKFAST   MAGNESIUM PO Take 250 mg by mouth daily.   mesalamine 1.2 g EC tablet Commonly known as: LIALDA Take 2.4 g by mouth in the morning and at bedtime.   metFORMIN 500 MG tablet Commonly known as: GLUCOPHAGE TAKE 1 TABLET BY MOUTH 2 TIMES DAILY WITH A MEAL.    methocarbamol 750 MG tablet Commonly known as: Robaxin-750 Take 1 tablet (750 mg total) by mouth every 8 (eight) hours as needed for muscle spasms.   montelukast 10 MG tablet Commonly known as: SINGULAIR Take 10 mg by mouth at bedtime.   oxyCODONE 5 MG immediate release tablet Commonly known as: Roxicodone Take 1 tablet (5 mg total) by mouth every 4 (four) hours as needed for severe pain.   predniSONE 20 MG tablet Commonly known as: DELTASONE Take 40 mg daily for 3 days   promethazine 12.5 MG tablet Commonly known as: PHENERGAN Take 1 tablet (12.5 mg total) by mouth every 6 (six) hours as needed for nausea or vomiting.   rosuvastatin 20 MG tablet Commonly known as: CRESTOR TAKE 1 TABLET BY MOUTH EVERY DAY   spironolactone 25 MG tablet Commonly known as: ALDACTONE Take 1 tablet (25 mg total) by mouth daily.   Symbicort 160-4.5 MCG/ACT inhaler Generic drug: budesonide-formoterol INHALE 2 PUFFS INTO THE LUNGS TWICE A DAY   triamcinolone 55 MCG/ACT Aero nasal inhaler Commonly known as: NASACORT Place 2 sprays into the nose daily.   venlafaxine XR 150 MG 24 hr capsule Commonly known as: EFFEXOR-XR TAKE 1 CAPSULE BY MOUTH DAILY WITH BREAKFAST.   zinc gluconate 50 MG tablet Take 50 mg by mouth  daily.   zolpidem 5 MG tablet Commonly known as: AMBIEN TAKE 1 TABLET BY MOUTH EVERY DAY AT BEDTIME AS NEEDED FOR SLEEP Strength: 5 mg               Discharge Care Instructions  (From admission, onward)           Start     Ordered   05/24/22 0000  Weight bearing as tolerated        05/24/22 1517            Follow-up Information     [name], MD. Go on 06/07/2022.   Specialty: Orthopedic Surgery Why: at 4:15pm Contact information: [address] [place] [address] [phone]         [facility] Follow up.   Specialty: [place] Why: for home health services, set up through [place] office  prior to admission Contact information: [address] [place] [address] [phone]                 Signed: Alisa Graff 05/24/2022, 3:19 PM

## 2022-05-24 NOTE — Plan of Care (Signed)
  Problem: Education: Goal: Knowledge of the prescribed therapeutic regimen will improve Outcome: Progressing Goal: Understanding of discharge needs will improve Outcome: Progressing   Problem: Activity: Goal: Ability to avoid complications of mobility impairment will improve Outcome: Progressing Goal: Ability to tolerate increased activity will improve Outcome: Progressing   Problem: Pain Management: Goal: Pain level will decrease with appropriate interventions Outcome: Progressing   Problem: Skin Integrity: Goal: Will show signs of wound healing Outcome: Progressing   Problem: Education: Goal: Knowledge of General Education information will improve Description: Including pain rating scale, medication(s)/side effects and non-pharmacologic comfort measures Outcome: Progressing   Problem: Clinical Measurements: Goal: Ability to maintain clinical measurements within normal limits will improve Outcome: Progressing Goal: Will remain free from infection Outcome: Progressing Goal: Respiratory complications will improve Outcome: Progressing   Problem: Activity: Goal: Risk for activity intolerance will decrease Outcome: Progressing   Problem: Nutrition: Goal: Adequate nutrition will be maintained Outcome: Progressing   Problem: Coping: Goal: Level of anxiety will decrease Outcome: Progressing   Problem: Safety: Goal: Ability to remain free from injury will improve Outcome: Progressing   Problem: Skin Integrity: Goal: Risk for impaired skin integrity will decrease Outcome: Progressing

## 2022-05-24 NOTE — Progress Notes (Signed)
Physical Therapy Treatment Patient Details Name: Erin Fields MRN: [id number] DOB: 01-Nov-1965 Today's Date: 05/24/2022   History of Present Illness Patient is a 57 y/o female who presents on 2/5 for left THA, direct anterior approach. 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    PT Comments    Patient making excellent progress with mobility and demonstrates good carryover on RW use from previous THA. Pt was able to complete bed mobility and transfers at mod independent level and ambulated ~200' with RW and no LOB. Gait slightly antalgic but improved with cues for step pattern leading with Rt LE. Pt completed seated TE's for ROM and strengthening and encouraged to ambulate with RN staff to reduce stiffness during stay. Pt is mobilizing at safe level for discharge home with assist from family. Will progress during stay as able.   Recommendations for follow up therapy are one component of a multi-disciplinary discharge planning process, led by the attending physician.  Recommendations may be updated based on patient status, additional functional criteria and insurance authorization.  Follow Up Recommendations  Follow physician's recommendations for discharge plan and follow up therapies     Assistance Recommended at Discharge PRN  Patient can return home with the following Assistance with cooking/housework;Assist for transportation   Equipment Recommendations  None recommended by PT    Recommendations for Other Services       Precautions / Restrictions Precautions Precautions: None Restrictions Weight Bearing Restrictions: No LLE Weight Bearing: Weight bearing as tolerated     Mobility  Bed Mobility Overal bed mobility: Modified Independent             General bed mobility comments: extra time, use of rail    Transfers Overall transfer level: Modified independent Equipment used:  Rolling walker (2 wheels)               General transfer comment: UE use to power up    Ambulation/Gait Ambulation/Gait assistance: Supervision Gait Distance (Feet): 200 Feet Assistive device: Rolling walker (2 wheels) Gait Pattern/deviations: Step-through pattern, Decreased stride length Gait velocity: decreased     General Gait Details: cues to initiate steps with Rt to reduce difficulty advancing Lt LE by loading hip flexor. pt reported reduced pain with this. no LOB, gait silghtly antalgic throughout.   Stairs             Wheelchair Mobility    Modified Rankin (Stroke Patients Only)       Balance Overall balance assessment: Mild deficits observed, not formally tested                                          Cognition Arousal/Alertness: Awake/alert Behavior During Therapy: WFL for tasks assessed/performed Overall Cognitive Status: Within Functional Limits for tasks assessed                                          Exercises Total Joint Exercises Ankle Circles/Pumps: AROM, Both, 10 reps, Seated Heel Slides: AAROM, Left, 10 reps Hip ABduction/ADduction: AAROM, Left, 10 reps Long Arc Quad: AROM, Both, 10 reps, Seated    General Comments        Pertinent Vitals/Pain Pain Assessment Pain Assessment: 0-10 Pain Score: 4  Pain Location: left hip Pain Descriptors / Indicators: Sore, Operative site guarding Pain Intervention(s): Limited activity within patient's tolerance, Monitored during session, Repositioned    Home Living                          Prior Function            PT Goals (current goals can now be found in the care plan section) Acute Rehab PT Goals Patient Stated Goal: decrease pain stop vomitting and return to independence PT Goal Formulation: With patient Time For Goal Achievement: 06/06/22 Potential to Achieve Goals: Good Progress towards PT goals: Progressing toward goals     Frequency    7X/week      PT Plan Current plan remains appropriate    Co-evaluation              AM-PAC PT "6 Clicks" Mobility   Outcome Measure  Help needed turning from your back to your side while in a flat bed without using bedrails?: None Help needed moving from lying on your back to sitting on the side of a flat bed without using bedrails?: None Help needed moving to and from a bed to a chair (including a wheelchair)?: None Help needed standing up from a chair using your arms (e.g., wheelchair or bedside chair)?: None Help needed to walk in hospital room?: A Little Help needed climbing 3-5 steps with a railing? : A Little 6 Click Score: 22    End of Session Equipment Utilized During Treatment: Gait belt Activity Tolerance: Patient tolerated treatment well Patient left: in chair;with call bell/phone within reach Nurse Communication: Mobility status PT Visit Diagnosis: Pain Pain - Right/Left: Left Pain - part of body: Hip     Time: 8832-5498 PT Time Calculation (min) (ACUTE ONLY): 21 min  Charges:  $Gait Training: 8-22 mins                     Verner Mould, DPT Acute Rehabilitation Services Office [phone]  05/24/22 1:15 PM

## 2022-05-24 NOTE — Progress Notes (Signed)
    Subjective: Patient reports pain as moderate.  Tolerating diet.  Urinating.   No CP, SOB.  Has mobilized some OOB with PT.   Objective:   VITALS:   Vitals:   05/23/22 2338 05/24/22 0355 05/24/22 0727 05/24/22 0803  BP:  (!) 102/55 108/66   Pulse:  87 99   Resp:  18 17   Temp:  98 F (36.7 C) 98.1 F (36.7 C)   TempSrc:  Oral Oral   SpO2: 96% 100% 97% 91%  Weight:      Height:          Latest Ref Rng & Units 05/11/2022   11:44 AM 04/05/2022   12:00 PM 01/18/2022   11:09 AM  CBC  WBC 4.0 - 10.5 K/uL 5.7  7.3  7.3   Hemoglobin 12.0 - 15.0 g/dL 12.4  13.2  12.4   Hematocrit 36.0 - 46.0 % 37.5  40.4  37.8   Platelets 150 - 400 K/uL 356  342  397       Latest Ref Rng & Units 05/19/2022   10:24 AM 05/15/2022    3:07 PM 05/11/2022   11:44 AM  BMP  Glucose 70 - 99 mg/dL 191  161  123   BUN 6 - 23 mg/dL 8  6  <5   Creatinine 0.40 - 1.20 mg/dL 0.67  0.63  0.63   Sodium 135 - 145 mEq/L 136  133  125   Potassium 3.5 - 5.1 mEq/L 4.5  4.7  4.5   Chloride 96 - 112 mEq/L 99  97  94   CO2 19 - 32 mEq/L '23  26  24   '$ Calcium 8.4 - 10.5 mg/dL 10.1  9.7  9.0    Intake/Output      02/06 0701 02/07 0700 02/07 0701 02/08 0700   P.O. 300    I.V. (mL/kg) 500 (6.8)    IV Piggyback 800    Total Intake(mL/kg) 1600 (21.8)    Blood 400    Total Output 400    Net +1200         Urine Occurrence 4 x    Emesis Occurrence 2 x       Physical Exam: General: NAD.  Sitting up in bedside chair, calm, comfortable Resp: No increased wob Cardio: regular rate and rhythm ABD soft Neurologically intact MSK Neurovascularly intact Sensation intact distally Intact pulses distally Dorsiflexion/Plantar flexion intact Incision: dressing C/D/I   Assessment: 1 Day Post-Op  S/P Procedure(s) (LRB): TOTAL HIP ARTHROPLASTY ANTERIOR APPROACH (Left) by Dr. Ernesta Amble. Percell Miller on 05/23/22  Principal Problem:   S/P total left hip arthroplasty   Plan: Feeling some vertigo so I will order Meclizine   Advance diet Up with therapy Incentive Spirometry Elevate and Apply ice  Weightbearing: WBAT LLE Insicional and dressing care: Dressings left intact until follow-up and Reinforce dressings as needed Orthopedic device(s): None Showering: Keep dressing dry VTE prophylaxis: Aspirin '81mg'$  BID  x 30 days , SCDs, ambulation Pain control: continue current regimen Follow - up plan: 2 weeks Contact information:  Edmonia Lynch MD, Aggie Moats PA-C  Dispo: Home hopefully today if passes PT evaluation    Alisa Graff Office [phone] 05/24/2022, 12:32 PM

## 2022-05-25 ENCOUNTER — Telehealth: Payer: Self-pay

## 2022-05-25 NOTE — Telephone Encounter (Signed)
Transition Care Management Follow-up Telephone Call Date of discharge and from where: [facility] 05/24/2022 How have you been since you were released from the hospital? good Any questions or concerns? No  Items Reviewed: Did the pt receive and understand the discharge instructions provided? Yes  Medications obtained and verified? Yes  Other? No  Any new allergies since your discharge? No  Dietary orders reviewed? Yes Do you have support at home? Yes   Home Care and Equipment/Supplies: Were home health services ordered? yes If so, what is the name of the agency? [facility]  Has the agency set up a time to come to the patient's home? no Were any new equipment or medical supplies ordered?  No What is the name of the medical supply agency? N/a Were you able to get the supplies/equipment? not applicable Do you have any questions related to the use of the equipment or supplies? No  Functional Questionnaire: (I = Independent and D = Dependent) ADLs: I  Bathing/Dressing- I  Meal Prep- I  Eating- I  Maintaining continence- I  Transferring/Ambulation- I  Managing Meds- I  Follow up appointments reviewed:  PCP Hospital f/u appt confirmed? No   Specialist Hospital f/u appt confirmed? Yes  Scheduled to see Dr Percell Miller on 06/07/2022 @ 4:15. Are transportation arrangements needed? No  If their condition worsens, is the pt aware to call PCP or go to the Emergency Dept.? Yes Was the patient provided with contact information for the PCP's office or ED? Yes Was to pt encouraged to call back with questions or concerns? Yes  .LH

## 2022-05-26 ENCOUNTER — Encounter (HOSPITAL_COMMUNITY): Payer: Self-pay | Admitting: Orthopedic Surgery

## 2022-06-12 ENCOUNTER — Other Ambulatory Visit (HOSPITAL_COMMUNITY): Payer: Self-pay | Admitting: Cardiology

## 2022-06-14 ENCOUNTER — Other Ambulatory Visit (HOSPITAL_COMMUNITY): Payer: No Typology Code available for payment source

## 2022-06-14 ENCOUNTER — Encounter (HOSPITAL_COMMUNITY): Payer: No Typology Code available for payment source | Admitting: Cardiology

## 2022-06-21 ENCOUNTER — Ambulatory Visit (HOSPITAL_COMMUNITY)
Admission: RE | Admit: 2022-06-21 | Discharge: 2022-06-21 | Disposition: A | Discharge status: Home / Self Care | Payer: No Typology Code available for payment source | Source: Ambulatory Visit | Attending: Cardiology | Admitting: Cardiology

## 2022-06-21 VITALS — BP 100/60 | HR 84 | Wt 159.0 lb

## 2022-06-21 DIAGNOSIS — I428 Other cardiomyopathies: Secondary | ICD-10-CM | POA: Insufficient documentation

## 2022-06-21 DIAGNOSIS — Z170 Estrogen receptor positive status [ER+]: Secondary | ICD-10-CM | POA: Diagnosis not present

## 2022-06-21 DIAGNOSIS — R0600 Dyspnea, unspecified: Secondary | ICD-10-CM | POA: Insufficient documentation

## 2022-06-21 DIAGNOSIS — C50411 Malignant neoplasm of upper-outer quadrant of right female breast: Secondary | ICD-10-CM | POA: Insufficient documentation

## 2022-06-21 DIAGNOSIS — I110 Hypertensive heart disease with heart failure: Secondary | ICD-10-CM | POA: Insufficient documentation

## 2022-06-21 DIAGNOSIS — K5190 Ulcerative colitis, unspecified, without complications: Secondary | ICD-10-CM | POA: Diagnosis not present

## 2022-06-21 DIAGNOSIS — Z8616 Personal history of COVID-19: Secondary | ICD-10-CM | POA: Diagnosis not present

## 2022-06-21 DIAGNOSIS — I2510 Atherosclerotic heart disease of native coronary artery without angina pectoris: Secondary | ICD-10-CM | POA: Insufficient documentation

## 2022-06-21 DIAGNOSIS — Z79899 Other long term (current) drug therapy: Secondary | ICD-10-CM | POA: Insufficient documentation

## 2022-06-21 DIAGNOSIS — Z95810 Presence of automatic (implantable) cardiac defibrillator: Secondary | ICD-10-CM | POA: Insufficient documentation

## 2022-06-21 DIAGNOSIS — M1611 Unilateral primary osteoarthritis, right hip: Secondary | ICD-10-CM | POA: Diagnosis not present

## 2022-06-21 DIAGNOSIS — R5383 Other fatigue: Secondary | ICD-10-CM | POA: Diagnosis present

## 2022-06-21 DIAGNOSIS — I5022 Chronic systolic (congestive) heart failure: Secondary | ICD-10-CM | POA: Insufficient documentation

## 2022-06-21 LAB — BRAIN NATRIURETIC PEPTIDE: B Natriuretic Peptide: 42.4 pg/mL (ref 0.0–100.0)

## 2022-06-21 LAB — BASIC METABOLIC PANEL
Anion gap: 12 (ref 5–15)
BUN: 7 mg/dL (ref 6–20)
CO2: 21 mmol/L — ABNORMAL LOW (ref 22–32)
Calcium: 9.3 mg/dL (ref 8.9–10.3)
Chloride: 101 mmol/L (ref 98–111)
Creatinine, Ser: 0.63 mg/dL (ref 0.44–1.00)
GFR, Estimated: >60 mL/min (ref >60)
Glucose, Bld: 111 mg/dL — ABNORMAL HIGH (ref 70–99)
Potassium: 4.3 mmol/L (ref 3.5–5.1)
Sodium: 134 mmol/L — ABNORMAL LOW (ref 135–145)

## 2022-06-21 MED ORDER — ENTRESTO 49-51 MG PO TABS: 1.0000 | ORAL_TABLET | Freq: Two times a day (BID) | ORAL | 11 refills | Status: DC

## 2022-06-21 NOTE — Patient Instructions (Signed)
DECREASE Entresto to 49/51 mg Twice daily  Labs done today, your results will be available in MyChart, we will contact you for abnormal readings.  Your physician recommends that you schedule a follow-up appointment in: 6 months(September) Call office in July to schedule an appointment  If you have any questions or concerns before your next appointment please send [place] a message through [place] or call our office at [phone].    TO LEAVE A MESSAGE FOR THE NURSE SELECT OPTION 2, PLEASE LEAVE A MESSAGE INCLUDING: YOUR NAME DATE OF BIRTH CALL BACK NUMBER REASON FOR CALL**this is important as we prioritize the call backs  YOU WILL RECEIVE A CALL BACK THE SAME DAY AS LONG AS YOU CALL BEFORE 4:00 PM  At the [facility], you and your health needs are our priority. As part of our continuing mission to provide you with exceptional heart care, we have created designated Provider Care Teams. These Care Teams include your primary Cardiologist (physician) and Advanced Practice Providers (APPs- Physician Assistants and Nurse Practitioners) who all work together to provide you with the care you need, when you need it.   You may see any of the following providers on your designated Care Team at your next follow up: Dr Glori Bickers Dr Loralie Champagne Dr. Roxana Hires, NP Lyda Jester, [place] [facility] [place], [place] Forestine Na, NP Audry Riles, PharmD   Please be sure to bring in all your medications bottles to every appointment.    Thank you for choosing [facility]

## 2022-06-21 NOTE — Progress Notes (Signed)
PCP: Dr. Lorelei Pont Cardiology: Dr. Agustin Cree  57 y.o. with history of ulcerative colitis, HTN, and breast cancer was referred by Dr. Agustin Cree for evaluation of CHF.   Patient's breast cancer was treated with surgery and radiation, no chemo.  Patient reports exertional dyspnea since 2019.  Patient has right hip OA, needs THR.  Echo was done in 1/22 as part of pre-op workup, this showed low EF 25%.  She then had RHC/LHC in 3/22 with mild nonobstructive CAD and low cardiac index 2.1.  Echo in 4/22 showed EF still 20-25%.  She was referred for ICD.  St Jude ICD was placed in [id number] but complicated by RV perforation with hemothorax and hemorrhagic shock.  ICD was removed and RV was surgically repaired with sternotomy.  Patient's mother had CHF in her [id number], uncertain etiology.   Cardiac MRI in 9/22 showed EF 35% with moderate LV hypokinesis, RV EF 44%, small area of LGE at the true apex (?small embolic apical infarct).    Echo in 2/23 showed EF 40-45%, diffuse hypokinesis, mildly decreased RV systolic function.  Echo in 12/23 showed EF up to 60-65% with mild RV dysfunction.   Patient returns for followup of CHF.  She is now s/p bilateral THR.  She is doing well, getting more mobile.  No significant exertional dyspnea or chest pain.  SBP down to 90s at times.  She gets fatigued in the afternoons and thinks this is due to low BP.   Labs (7/22): K 4.2, creatinine 0.58 Labs (8/22): K 4.3, creatinine 0.52, BNP 30 Labs (10/22): K 4.2, creatinine 0.76 Labs (1/23): K 5.1, creatinine 0.71 Labs (8/23): K 4.1, creatinine 0.8 Labs (2/24): K 4.5, creatinine 0.67  PMH: 1.  Cervical cancer 2.  B12 deficiency 3.  Asthma 4.  Type 2 diabetes 5.  Ulcerative colitis 6.  HTN 7.  Hypothyroidism 8.  Irritable bowel syndrome 9.  Breast cancer: Right-sided, surgery and radiation but no chemotherapy.  10.  Chronic systolic CHF: Nonischemic cardiomyopathy.  - Echo (1/22): EF 25% - LHC/RHC (3/22): Mild nonobstructive CAD;  mean RA 3, PA 24/11, CI 2.1.  - Echo (4/22): EF 20-25%, mild LV dilation, normal RV.  - Echo (6/22): EF 30-35%  - St Jude ICD placed [id number], complicated by RV perforation requiring sternotomy due to hemothorax/hemorrhagic shock, removal of ICD and surgical repair of RV perforation.  - Cardiac MRI (9/22): EF 35% with moderate LV hypokinesis, RV EF 44%, small area of LGE at the true apex (?small embolic apical infarct).   - Echo (2/23): EF 40-45%, diffuse hypokinesis, mildly decreased RV systolic function. - Echo (12/23): EF 60-65%, mild RV dysfunction.  11.  OA: s/p right and left hip THR 12.  COVID-19 12/21  FH: Grandfather with MI, mother with CHF in her 48s.    SH: Lives in [place], nonsmoker, rare EtoH.   ROS: All systems reviewed and negative except as per HPI.  Current Outpatient Medications  Medication Sig Dispense Refill   acetaminophen (TYLENOL) 500 MG tablet Take 2 tablets (1,000 mg total) by mouth every 6 (six) hours as needed for moderate pain or mild pain. 30 tablet 0   acyclovir (ZOVIRAX) 400 MG tablet TAKE 1 TABLET (400 MG TOTAL) BY MOUTH 3 TIMES A DAY AS NEEDED (Patient taking differently: Take 400 mg by mouth 3 (three) times daily as needed.) 270 tablet 1   Adalimumab 80 MG/0.8ML PNKT Inject 80 mg into the skin every 14 (fourteen) days.     albuterol (VENTOLIN HFA)  108 (90 Base) MCG/ACT inhaler Inhale 2 puffs into the lungs every 6 (six) hours as needed for shortness of breath.     aspirin EC 81 MG tablet Take 1 tablet (81 mg total) by mouth 2 (two) times daily. To prevent blood clots for 30 days after surgery. 60 tablet 0   Azelastine HCl 0.15 % SOLN Place 2 sprays into both nostrils 2 (two) times daily.     carvedilol (COREG) 12.5 MG tablet Take 1 tablet (12.5 mg total) by mouth 2 (two) times daily with a meal. 180 tablet 0   cetirizine (ZYRTEC) 10 MG tablet Take 10 mg by mouth daily.     dapagliflozin propanediol (FARXIGA) 10 MG TABS tablet Take 1 tablet (10 mg total) by  mouth daily before breakfast. 30 tablet 11   diphenoxylate-atropine (LOMOTIL) 2.5-0.025 MG tablet Take 2 tablets by mouth 4 (four) times daily as needed for diarrhea or loose stools.     docusate sodium (COLACE) 100 MG capsule Take 1 capsule (100 mg total) by mouth 2 (two) times daily as needed for mild constipation or moderate constipation. 20 capsule 0   EPINEPHrine 0.3 mg/0.3 mL IJ SOAJ injection Inject 0.3 mg into the muscle as needed for anaphylaxis.     exemestane (AROMASIN) 25 MG tablet TAKE 1 TABLET (25 MG TOTAL) BY MOUTH DAILY AFTER BREAKFAST. 90 tablet 3   Hyoscyamine Sulfate 0.375 MG TBCR Take 0.375 mg by mouth 2 (two) times daily as needed (stomach cramping).     ipratropium (ATROVENT) 0.06 % nasal spray Place 2 sprays into both nostrils 4 (four) times daily. Use as needed 15 mL 12   lansoprazole (PREVACID SOLUTAB) 15 MG disintegrating tablet Take 15 mg by mouth 2 (two) times daily before a meal.     levothyroxine (SYNTHROID) 75 MCG tablet TAKE 1 TABLET BY MOUTH EVERY DAY BEFORE BREAKFAST 90 tablet 1   MAGNESIUM PO Take 250 mg by mouth daily.     Menthol, Topical Analgesic, (BIOFREEZE COLORLESS) 4 % GEL Apply 1 application topically daily as needed (Hip pain).     mesalamine (LIALDA) 1.2 g EC tablet Take 2.4 g by mouth in the morning and at bedtime.      metFORMIN (GLUCOPHAGE) 500 MG tablet TAKE 1 TABLET BY MOUTH 2 TIMES DAILY WITH A MEAL. 180 tablet 3   methocarbamol (ROBAXIN-750) 750 MG tablet Take 1 tablet (750 mg total) by mouth every 8 (eight) hours as needed for muscle spasms. 20 tablet 0   montelukast (SINGULAIR) 10 MG tablet Take 10 mg by mouth at bedtime.      oxyCODONE (ROXICODONE) 5 MG immediate release tablet Take 1 tablet (5 mg total) by mouth every 4 (four) hours as needed for severe pain. 30 tablet 0   promethazine (PHENERGAN) 12.5 MG tablet Take 1 tablet (12.5 mg total) by mouth every 6 (six) hours as needed for nausea or vomiting. 30 tablet 0   rosuvastatin (CRESTOR) 20  MG tablet TAKE 1 TABLET BY MOUTH EVERY DAY 90 tablet 0   sacubitril-valsartan (ENTRESTO) 49-51 MG Take 1 tablet by mouth 2 (two) times daily. 60 tablet 11   spironolactone (ALDACTONE) 25 MG tablet Take 1 tablet (25 mg total) by mouth daily. 90 tablet 3   SYMBICORT 160-4.5 MCG/ACT inhaler INHALE 2 PUFFS INTO THE LUNGS TWICE A DAY 30.6 each 1   triamcinolone (NASACORT) 55 MCG/ACT AERO nasal inhaler Place 2 sprays into the nose daily.     venlafaxine XR (EFFEXOR-XR) 150 MG 24 hr  capsule TAKE 1 CAPSULE BY MOUTH DAILY WITH BREAKFAST. 90 capsule 1   zinc gluconate 50 MG tablet Take 50 mg by mouth daily.     zolpidem (AMBIEN) 5 MG tablet TAKE 1 TABLET BY MOUTH EVERY DAY AT BEDTIME AS NEEDED FOR SLEEP Strength: 5 mg 30 tablet 3   No current facility-administered medications for this encounter.   General: NAD Neck: No JVD, no thyromegaly or thyroid nodule.  Lungs: Clear to auscultation bilaterally with normal respiratory effort. CV: Nondisplaced PMI.  Heart regular S1/S2, no S3/S4, no murmur.  No peripheral edema.  No carotid bruit.  Normal pedal pulses.  Abdomen: Soft, nontender, no hepatosplenomegaly, no distention.  Skin: Intact without lesions or rashes.  Neurologic: Alert and oriented x 3.  Psych: Normal affect. Extremities: No clubbing or cyanosis.  HEENT: Normal.   Assessment/Plan: 1. Chronic systolic CHF: Nonischemic cardiomyopathy.  EF noted to be low since 1/22.  LHC in 3/22 with mild nonobstructive coronary disease.  Last echo in 6/22 with EF 30-35%.  Cardic MRI in 9/22 showed EF 35% with moderate LV hypokinesis, RV EF 44%, small area of LGE at the true apex (?small embolic apical infarct).  Echo in 2/23 showed EF up to 40-45%.  Cause uncertain, her mother had a cardiomyopathy of uncertain etiology so cannot rule out familial cardiomyopathy.  She had treatment for breast cancer but this did not include chemotherapy.  She has been on adalimumab for ulcerative colitis, but this does not  commonly cause CHF.  No drugs and rare ETOH.  The small area of apical LGE does not explain the cardiomyopathy.  Echo in 12/23 showed full recovery of LV function with EF up to 60-65%.  NYHA class I-II symptoms. - With low BP and fatigue at times, will have her decrease Entresto to 49/51 bid.   - Continue Coreg 12.5 mg bid  - Continue dapagliflozin 10 mg daily.  - Continue spironolactone 25 mg daily.   2. RV perforation with ICD placement: S/p sternotomy/repair in 6/22.     Followup in 6 months   Loralie Champagne 06/21/2022

## 2022-07-04 LAB — HM DIABETES EYE EXAM

## 2022-07-21 ENCOUNTER — Other Ambulatory Visit: Payer: Self-pay | Admitting: Nurse Practitioner

## 2022-07-21 DIAGNOSIS — C50411 Malignant neoplasm of upper-outer quadrant of right female breast: Secondary | ICD-10-CM

## 2022-07-26 ENCOUNTER — Inpatient Hospital Stay (HOSPITAL_BASED_OUTPATIENT_CLINIC_OR_DEPARTMENT_OTHER): Payer: No Typology Code available for payment source | Admitting: Nurse Practitioner

## 2022-07-26 ENCOUNTER — Inpatient Hospital Stay: Payer: No Typology Code available for payment source | Attending: Hematology

## 2022-07-26 ENCOUNTER — Encounter: Payer: Self-pay | Admitting: Nurse Practitioner

## 2022-07-26 ENCOUNTER — Other Ambulatory Visit: Payer: Self-pay | Admitting: Family Medicine

## 2022-07-26 VITALS — BP 99/66 | HR 94 | Temp 98.3°F | Resp 18 | Ht 63.0 in | Wt 161.8 lb

## 2022-07-26 DIAGNOSIS — Z96643 Presence of artificial hip joint, bilateral: Secondary | ICD-10-CM | POA: Insufficient documentation

## 2022-07-26 DIAGNOSIS — Z79899 Other long term (current) drug therapy: Secondary | ICD-10-CM | POA: Diagnosis not present

## 2022-07-26 DIAGNOSIS — Z170 Estrogen receptor positive status [ER+]: Secondary | ICD-10-CM | POA: Insufficient documentation

## 2022-07-26 DIAGNOSIS — J45909 Unspecified asthma, uncomplicated: Secondary | ICD-10-CM | POA: Insufficient documentation

## 2022-07-26 DIAGNOSIS — E039 Hypothyroidism, unspecified: Secondary | ICD-10-CM | POA: Insufficient documentation

## 2022-07-26 DIAGNOSIS — K5190 Ulcerative colitis, unspecified, without complications: Secondary | ICD-10-CM | POA: Diagnosis not present

## 2022-07-26 DIAGNOSIS — I10 Essential (primary) hypertension: Secondary | ICD-10-CM | POA: Diagnosis not present

## 2022-07-26 DIAGNOSIS — Z87891 Personal history of nicotine dependence: Secondary | ICD-10-CM | POA: Diagnosis not present

## 2022-07-26 DIAGNOSIS — C50411 Malignant neoplasm of upper-outer quadrant of right female breast: Secondary | ICD-10-CM

## 2022-07-26 DIAGNOSIS — B001 Herpesviral vesicular dermatitis: Secondary | ICD-10-CM

## 2022-07-26 DIAGNOSIS — D500 Iron deficiency anemia secondary to blood loss (chronic): Secondary | ICD-10-CM | POA: Diagnosis not present

## 2022-07-26 DIAGNOSIS — M1990 Unspecified osteoarthritis, unspecified site: Secondary | ICD-10-CM | POA: Insufficient documentation

## 2022-07-26 DIAGNOSIS — F419 Anxiety disorder, unspecified: Secondary | ICD-10-CM | POA: Insufficient documentation

## 2022-07-26 DIAGNOSIS — M8580 Other specified disorders of bone density and structure, unspecified site: Secondary | ICD-10-CM | POA: Diagnosis not present

## 2022-07-26 DIAGNOSIS — Z79811 Long term (current) use of aromatase inhibitors: Secondary | ICD-10-CM | POA: Diagnosis not present

## 2022-07-26 LAB — CBC WITH DIFFERENTIAL (CANCER CENTER ONLY)
Abs Immature Granulocytes: 0.02 10*3/uL (ref 0.00–0.07)
Basophils Absolute: 0.1 10*3/uL (ref 0.0–0.1)
Basophils Relative: 1 %
Eosinophils Absolute: 0.2 10*3/uL (ref 0.0–0.5)
Eosinophils Relative: 3 %
HCT: 35.7 % — ABNORMAL LOW (ref 36.0–46.0)
Hemoglobin: 11.3 g/dL — ABNORMAL LOW (ref 12.0–15.0)
Immature Granulocytes: 0 %
Lymphocytes Relative: 36 %
Lymphs Abs: 2.3 10*3/uL (ref 0.7–4.0)
MCH: 25.9 pg — ABNORMAL LOW (ref 26.0–34.0)
MCHC: 31.7 g/dL (ref 30.0–36.0)
MCV: 81.7 fL (ref 80.0–100.0)
Monocytes Absolute: 0.6 10*3/uL (ref 0.1–1.0)
Monocytes Relative: 9 %
Neutro Abs: 3.2 10*3/uL (ref 1.7–7.7)
Neutrophils Relative %: 51 %
Platelet Count: 492 10*3/uL — ABNORMAL HIGH (ref 150–400)
RBC: 4.37 MIL/uL (ref 3.87–5.11)
RDW: 14.2 % (ref 11.5–15.5)
WBC Count: 6.4 10*3/uL (ref 4.0–10.5)
nRBC: 0 % (ref 0.0–0.2)

## 2022-07-26 LAB — CMP (CANCER CENTER ONLY)
ALT: 12 U/L (ref 0–44)
AST: 11 U/L — ABNORMAL LOW (ref 15–41)
Albumin: 4.5 g/dL (ref 3.5–5.0)
Alkaline Phosphatase: 91 U/L (ref 38–126)
Anion gap: 9 (ref 5–15)
BUN: 11 mg/dL (ref 6–20)
CO2: 23 mmol/L (ref 22–32)
Calcium: 9.7 mg/dL (ref 8.9–10.3)
Chloride: 102 mmol/L (ref 98–111)
Creatinine: 0.7 mg/dL (ref 0.44–1.00)
GFR, Estimated: >60 mL/min (ref >60)
Glucose, Bld: 191 mg/dL — ABNORMAL HIGH (ref 70–99)
Potassium: 4.4 mmol/L (ref 3.5–5.1)
Sodium: 134 mmol/L — ABNORMAL LOW (ref 135–145)
Total Bilirubin: 0.6 mg/dL (ref 0.3–1.2)
Total Protein: 6.9 g/dL (ref 6.5–8.1)

## 2022-07-26 NOTE — Progress Notes (Signed)
Patient Care Team: Copland, Gwenlyn Found, MD as PCP - General (Family Medicine) Georgeanna Lea, MD as PCP - Cardiology (Cardiology) Essie Hart, MD (Inactive) as Referring Physician (Obstetrics and Gynecology) Sharrell Ku, MD as Consulting Physician (Gastroenterology) Sidney Ace, MD as Referring Physician (Allergy) Almond Lint, MD as Consulting Physician (General Surgery) Antony Blackbird, MD as Consulting Physician (Radiation Oncology) Malachy Mood, MD as Consulting Physician (Hematology) Axel Filler, Larna Daughters, NP as Nurse Practitioner (Hematology and Oncology) [name], MD as Consulting Physician (Gynecologic Oncology) Georgeanna Lea, MD as Consulting Physician (Cardiology)   CHIEF COMPLAINT: Follow-up right breast cancer  Oncology History Overview Note  Cancer Staging Malignant neoplasm of upper-outer quadrant of right breast in female, estrogen receptor positive (HCC) Staging form: Breast, AJCC 8th Edition - Clinical stage from 03/16/2017: Stage IA (cT1b, cN0, cM0, G2, ER: Positive, PR: Positive, HER2: Negative) - Signed by Malachy Mood, MD on 03/28/2017 - Pathologic stage from 05/15/2017: Stage IA (pT2, pN0, cM0, G2, ER+, PR+, HER2-, Oncotype DX score: 3) - Signed by Malachy Mood, MD on 08/06/2017     Malignant neoplasm of upper-outer quadrant of right breast in female, estrogen receptor positive  03/15/2017 Mammogram   Diagnostic Mammogram Right Breast  IMPRESSION: Palpable abnormality corresponds to distortion and focal mass associated acoustic shadowing measuring 0.8x0.9x0.9cm in the 930 o'clock location of the right breast 7 cm from the nipple.    RECOMMENDATION: Ultrasound-guided core biopsy is recommended and scheduled for the patient.   03/16/2017 Initial Biopsy   Biopsy Results  Diagnosis 03/16/17 Breast, right, needle core biopsy, 9:30 o'clock, ribbon clip placed - INVASIVE MAMMARY CARCINOMA. - SEE COMMENT.    03/16/2017 Receptors her2    Estrogen Receptor: 95%, POSITIVE, STRONG STAINING INTENSITY Progesterone Receptor: 95%, POSITIVE, STRONG STAINING INTENSITY Proliferation Marker Ki67: 3% HER2: NEGATIVE   03/20/2017 Initial Diagnosis   Malignant neoplasm of upper-outer quadrant of right breast in female, estrogen receptor positive (HCC)   04/24/2017 Surgery   Right Breast Lumpectomy with Sentinel Node Biopsy with Dr. Donell Beers    04/24/2017 Pathology Results   Diagnosis 04/24/17 1. Breast, lumpectomy, Right w/seed - INVASIVE LOBULAR CARCINOMA, GRADE 2, SPANNING 2.2 CM. - LOBULAR NEOPLASIA (ATYPICAL LOBULAR HYPERPLASIA). - ANTERIOR SUPERIOR MARGIN IS BROADLY POSITIVE. - BIOPSY SITE. - SEE ONCOLOGY TABLE. 2. Breast, excision, Right additional Medial Margin - INVASIVE LOBULAR CARCINOMA. - LOBULAR CARCINOMA IN SITU. - INVASIVE CARCINOMA IS 0.3 CM FROM NEW MEDIAL MARGIN. 3. Lymph node, sentinel, biopsy, Right Axillary #1 - ISOLATED TUMOR CELLS IN ONE OF ONE LYMPH NODES (0/1). 4. Lymph node, sentinel, biopsy, Right Axillary #2 - ONE OF ONE LYMPH NODES NEGATIVE FOR CARCINOMA (0/1). 5. Lymph node, sentinel, biopsy, Right Axillary #3 - ONE OF ONE LYMPH NODES NEGATIVE FOR CARCINOMA (0/1).   04/24/2017 Oncotype testing   Her Oncotyple recurrence score was 3, her distant recurrence score with Tamoxifen was 3% and her adjuvant chemotherapy benefit was <1%.    05/15/2017 Surgery   Re-excision of Right Breast Lumpectomy with Dr. Donell Beers    05/15/2017 Pathology Results   Diagnosis 05/15/17 Breast, excision, Right additional anterior margin - INVASIVE LOBULAR CARCINOMA. - ATYPICAL LOBULAR HYPERPLASIA. - THE NEW ANTERIOR SURGICAL RESECTION MARGIN IS NEGATIVE FOR CARCINOMA.   05/15/2017 Cancer Staging   Staging form: Breast, AJCC 8th Edition - Pathologic stage from 05/15/2017: Stage IA (pT2, pN0, cM0, G2, ER+, PR+, HER2-, Oncotype DX score: 3) - Signed by Malachy Mood, MD on 08/06/2017   06/26/2017 -  Radiation Therapy  Adjuvant RT with  Dr. Roselind Messier   07/12/2017 Genetic Testing   Negative genetic testing on the multi-cancer panel.  The Multi-Gene Panel offered by [name] includes sequencing and/or deletion duplication testing of the following 83 genes: ALK, APC, ATM, AXIN2,BAP1,  BARD1, BLM, BMPR1A, BRCA1, BRCA2, BRIP1, CASR, CDC73, CDH1, CDK4, CDKN1B, CDKN1C, CDKN2A (p14ARF), CDKN2A (p16INK4a), CEBPA, CHEK2, CTNNA1, DICER1, DIS3L2, EGFR (c.2369C>T, p.Thr790Met variant only), EPCAM (Deletion/duplication testing only), FH, FLCN, GATA2, GPC3, GREM1 (Promoter region deletion/duplication testing only), HOXB13 (c.251G>A, p.Gly84Glu), HRAS, KIT, MAX, MEN1, MET, MITF (c.952G>A, p.Glu318Lys variant only), MLH1, MSH2, MSH3, MSH6, MUTYH, NBN, NF1, NF2, NTHL1, PALB2, PDGFRA, PHOX2B, PMS2, POLD1, POLE, POT1, PRKAR1A, PTCH1, PTEN, RAD50, RAD51C, RAD51D, RB1, RECQL4, RET, RUNX1, SDHAF2, SDHA (sequence changes only), SDHB, SDHC, SDHD, SMAD4, SMARCA4, SMARCB1, SMARCE1, STK11, SUFU, TERT, TERT, TMEM127, TP53, TSC1, TSC2, VHL, WRN and WT1.  The report date is July 12, 2017.    08/13/2017 Imaging   08/13/2017 DEXA ASSESSMENT: The BMD measured at AP Spine L1-L4 is 1.120 g/cm2 with a T-score of -0.5. This patient is considered normal according to World Health Organization [facility]) criteria.    08/2017 -  Anti-estrogen oral therapy   Letrozole daily, changed to Exemestane due to poor tolerance of Letrozole   01/16/2018 Imaging   01/16/2018 CT Chest IMPRESSION: Stable 2 mm RIGHT upper lobe nodule.   Post radiation therapy changes at the anterolateral RIGHT upper lobe.   Post RIGHT breast surgery and RIGHT axillary node dissection.   No new abnormalities.   Aortic Atherosclerosis (ICD10-I70.0).   08/09/2018 Mammogram   IMPRESSION: 1. No mammographic evidence of malignancy involving either breast. 2. Expected post lumpectomy and post radiation changes involving the RIGHT breast.      CURRENT THERAPY: Exemestane, started 09/2017  INTERVAL  HISTORY Ms. Erin Fields returns for follow-up as scheduled, last seen by Dr. Mosetta Putt 01/18/2022.  Mammogram 10/6 was negative.  She underwent right hip replacement 12/20/2021 and left hip replacement 05/23/2022 by Dr. Renaye Rakers. She has recovered well. Other health conditions are stable except she went off UC medication for hip surgery. Denies any new bone/joint pain, hot flashes, or breast concerns such as new lump/mass, nipple discharge, or skin change.  ROS  All other systems reviewed and negative   Past Medical History:  Diagnosis Date   Allergic rhinitis due to animal (cat) (dog) hair and dander 04/27/2020   Allergic rhinitis due to pollen 04/27/2020   Anxiety    Anxiety and depression 01/20/2018   Arthritis    Asthma    Asthma, persistent controlled 10/05/2018   B12 deficiency 11/27/2019   Borderline diabetes 12/17/2015   Breast cancer    Cancer 2019   right breast cancer   CERVICAL CANCER, HX OF 12/06/2006   Qualifier: Diagnosis of  By: Lovell Sheehan MD, John E    Chronic sinusitis 01/26/2015   Chronic venous insufficiency 09/02/2015   Colon polyps    Congestive heart failure 04/29/2020   Controlled type 2 diabetes mellitus without complication, without long-term current use of insulin 05/28/2017   Coronary artery disease 25% of RCA, 20% intermediate branch, 40% diagonal branch based on cardiac cath from 2022 07/22/2020   Crohn disease 11/29/2016   Depression    Diabetes mellitus without complication    type 2   Dilated cardiomyopathy (HCC) ejection fraction 25% by echocardiogram from January 2022 04/29/2020   Dyspnea 09/15/2014   Elevated triglycerides with high cholesterol 11/26/2013   Environmental allergies    Eustachian tube dysfunction 12/04/2014   Ex-smoker  11/18/2015   Family history of breast cancer    Genetic testing 07/16/2017   Negative genetic testing on the multi-cancer panel.  The Multi-Gene Panel offered by [name] includes sequencing and/or deletion duplication testing of  the following 83 genes: ALK, APC, ATM, AXIN2,BAP1,  BARD1, BLM, BMPR1A, BRCA1, BRCA2, BRIP1, CASR, CDC73, CDH1, CDK4, CDKN1B, CDKN1C, CDKN2A (p14ARF), CDKN2A (p16INK4a), CEBPA, CHEK2, CTNNA1, DICER1, DIS3L2, EGFR (c.2369C>T, p.Thr790Met variant onl   GERD (gastroesophageal reflux disease)    HEADACHE 12/06/2006   Qualifier: Diagnosis of  By: Lovell Sheehan MD, John E    Hemothorax on left 10/02/2020   History of adenomatous polyp of colon 11/19/2017   Hyperlipidemia    Hypertension    Hypothyroidism    IDA (iron deficiency anemia) 11/06/2019   Impaired fasting glucose 12/12/2017   Insomnia 08/20/2013   Irritable bowel syndrome 11/29/2006   Qualifier: Diagnosis of  By: Lovell Sheehan MD, John E    Left sided abdominal pain 12/13/2017   Liver hemangioma 08/24/2014   Malignant neoplasm of upper-outer quadrant of right breast in female, estrogen receptor positive 03/20/2017   Migraines    Mild persistent asthma, uncomplicated 04/27/2020   Nasal polyp, unspecified 01/10/2018   Oral herpes 04/20/2013   Other allergic rhinitis 09/15/2014   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 10/02/2020   Personal history of radiation therapy 2019   36 radiation tx   PONV (postoperative nausea and vomiting)    scopolamine patch helps   Recurrent infections 10/05/2018   Right upper lobe pulmonary nodule 01/10/2018   Symptomatic varicose veins, right 09/02/2015   Ulcerative colitis    Varicose veins      Past Surgical History:  Procedure Laterality Date   BREAST BIOPSY Right 02/2017   BREAST BIOPSY Right 11/07/2019   BREAST LUMPECTOMY Right 04/24/2017   BREAST LUMPECTOMY WITH RADIOACTIVE SEED AND SENTINEL LYMPH NODE BIOPSY Right 04/24/2017   Procedure: RIGHT BREAST LUMPECTOMY WITH RADIOACTIVE SEED AND SENTINEL LYMPH NODE BIOPSY;  Surgeon: Almond Lint, MD;  Location: [facility];  Service: General;  Laterality: Right;   CORONARY ARTERY BYPASS GRAFT     crapal tunnel relase Left 05/2018    ENDOMETRIAL ABLATION  2010   Had abnormal uterine bleeding (heavy and frequent), performed in [place], menses stopped completely 2 years after   EXPLORATORY LAPAROTOMY     Had exploratory surgery at the age of 51 for abdominal pain with cyst found on her left ovary, not treated surgically   FOOT SURGERY Right 2012   GENERATOR REMOVAL  10/02/2020   Procedure: GENERATOR REMOVAL;  Surgeon: Purcell Nails, MD;  Location: [facility] OR;  Service: Thoracic;;   HAND SURGERY Right    ICD IMPLANT N/A 10/01/2020   Procedure: ICD IMPLANT;  Surgeon: Regan Lemming, MD;  Location: [facility];  Service: Cardiovascular;  Laterality: N/A;   ICD LEAD REMOVAL N/A 10/02/2020   Procedure: ICD LEAD REMOVAL;  Surgeon: Purcell Nails, MD;  Location: [facility] OR;  Service: Thoracic;  Laterality: N/A;   MEDIASTERNOTOMY N/A 10/02/2020   Procedure: MEDIAN STERNOTOMY REPAIR PERFORATED VENTRICLE EVACUATION LEFT HEMOTHORAX;  Surgeon: Purcell Nails, MD;  Location: [facility] OR;  Service: Thoracic;  Laterality: N/A;   RE-EXCISION OF BREAST LUMPECTOMY Right 05/15/2017   Procedure: RE-EXCISION OF BREAST LUMPECTOMY;  Surgeon: Almond Lint, MD;  Location: [facility];  Service: General;  Laterality: Right;   RIGHT/LEFT HEART CATH AND CORONARY ANGIOGRAPHY N/A 06/21/2020   Procedure: RIGHT/LEFT HEART CATH AND CORONARY ANGIOGRAPHY;  Surgeon: Yvonne Kendall, MD;  Location: [facility];  Service: Cardiovascular;  Laterality: N/A;   ROBOTIC ASSISTED SALPINGO OOPHERECTOMY Bilateral 08/29/2019   Procedure: XI ROBOTIC ASSISTED SALPINGO OOPHORECTOMY;  Surgeon: [name], MD;  Location: [facility];  Service: Gynecology;  Laterality: Bilateral;   Septoplasty with turbinate reduction     TEE WITHOUT CARDIOVERSION N/A 10/02/2020   Procedure: TRANSESOPHAGEAL ECHOCARDIOGRAM (TEE);  Surgeon: Purcell Nails, MD;  Location: [facility] OR;  Service: Thoracic;  Laterality: N/A;   TONSILLECTOMY  1980    TOTAL HIP ARTHROPLASTY Right 12/20/2021   Procedure: RIGHT TOTAL HIP ARTHROPLASTY ANTERIOR APPROACH;  Surgeon: Sheral Apley, MD;  Location: [facility] OR;  Service: Orthopedics;  Laterality: Right;   TOTAL HIP ARTHROPLASTY Left 05/23/2022   Procedure: TOTAL HIP ARTHROPLASTY ANTERIOR APPROACH;  Surgeon: Sheral Apley, MD;  Location: [facility] OR;  Service: Orthopedics;  Laterality: Left;   VEIN SURGERY     Removal Right Leg   WISDOM TOOTH EXTRACTION       Outpatient Encounter Medications as of 07/26/2022  Medication Sig Note   acetaminophen (TYLENOL) 500 MG tablet Take 2 tablets (1,000 mg total) by mouth every 6 (six) hours as needed for moderate pain or mild pain.    acyclovir (ZOVIRAX) 400 MG tablet Take 1 tablet (400 mg total) by mouth 3 (three) times daily as needed.    Adalimumab 80 MG/0.8ML PNKT Inject 80 mg into the skin every 14 (fourteen) days.    albuterol (VENTOLIN HFA) 108 (90 Base) MCG/ACT inhaler Inhale 2 puffs into the lungs every 6 (six) hours as needed for shortness of breath.    aspirin EC 81 MG tablet Take 1 tablet (81 mg total) by mouth 2 (two) times daily. To prevent blood clots for 30 days after surgery.    Azelastine HCl 0.15 % SOLN Place 2 sprays into both nostrils 2 (two) times daily.    carvedilol (COREG) 12.5 MG tablet Take 1 tablet (12.5 mg total) by mouth 2 (two) times daily with a meal.    cetirizine (ZYRTEC) 10 MG tablet Take 10 mg by mouth daily.    dapagliflozin propanediol (FARXIGA) 10 MG TABS tablet Take 1 tablet (10 mg total) by mouth daily before breakfast.    diphenoxylate-atropine (LOMOTIL) 2.5-0.025 MG tablet Take 2 tablets by mouth 4 (four) times daily as needed for diarrhea or loose stools.    docusate sodium (COLACE) 100 MG capsule Take 1 capsule (100 mg total) by mouth 2 (two) times daily as needed for mild constipation or moderate constipation.    EPINEPHrine 0.3 mg/0.3 mL IJ SOAJ injection Inject 0.3 mg into the muscle as needed for anaphylaxis.     Hyoscyamine Sulfate 0.375 MG TBCR Take 0.375 mg by mouth 2 (two) times daily as needed (stomach cramping).    ipratropium (ATROVENT) 0.06 % nasal spray Place 2 sprays into both nostrils 4 (four) times daily. Use as needed    lansoprazole (PREVACID SOLUTAB) 15 MG disintegrating tablet Take 15 mg by mouth 2 (two) times daily before a meal.    levothyroxine (SYNTHROID) 75 MCG tablet TAKE 1 TABLET BY MOUTH EVERY DAY BEFORE BREAKFAST    MAGNESIUM PO Take 250 mg by mouth daily.    Menthol, Topical Analgesic, (BIOFREEZE COLORLESS) 4 % GEL Apply 1 application topically daily as needed (Hip pain).    mesalamine (LIALDA) 1.2 g EC tablet Take 2.4 g by mouth in the morning and at bedtime.     metFORMIN (GLUCOPHAGE) 500  MG tablet TAKE 1 TABLET BY MOUTH 2 TIMES DAILY WITH A MEAL.    montelukast (SINGULAIR) 10 MG tablet Take 10 mg by mouth at bedtime.     oxyCODONE (ROXICODONE) 5 MG immediate release tablet Take 1 tablet (5 mg total) by mouth every 4 (four) hours as needed for severe pain.    promethazine (PHENERGAN) 12.5 MG tablet Take 1 tablet (12.5 mg total) by mouth every 6 (six) hours as needed for nausea or vomiting.    rosuvastatin (CRESTOR) 20 MG tablet TAKE 1 TABLET BY MOUTH EVERY DAY    sacubitril-valsartan (ENTRESTO) 49-51 MG Take 1 tablet by mouth 2 (two) times daily.    spironolactone (ALDACTONE) 25 MG tablet Take 1 tablet (25 mg total) by mouth daily.    SYMBICORT 160-4.5 MCG/ACT inhaler INHALE 2 PUFFS INTO THE LUNGS TWICE A DAY    triamcinolone (NASACORT) 55 MCG/ACT AERO nasal inhaler Place 2 sprays into the nose daily.    venlafaxine XR (EFFEXOR-XR) 150 MG 24 hr capsule TAKE 1 CAPSULE BY MOUTH DAILY WITH BREAKFAST.    zinc gluconate 50 MG tablet Take 50 mg by mouth daily.    zolpidem (AMBIEN) 5 MG tablet TAKE 1 TABLET BY MOUTH EVERY DAY AT BEDTIME AS NEEDED FOR SLEEP Strength: 5 mg    [DISCONTINUED] exemestane (AROMASIN) 25 MG tablet TAKE 1 TABLET (25 MG TOTAL) BY MOUTH DAILY AFTER BREAKFAST.  07/26/2022: pt will complete therapy on 10/15/22   methocarbamol (ROBAXIN-750) 750 MG tablet Take 1 tablet (750 mg total) by mouth every 8 (eight) hours as needed for muscle spasms.    [DISCONTINUED] acyclovir (ZOVIRAX) 400 MG tablet TAKE 1 TABLET (400 MG TOTAL) BY MOUTH 3 TIMES A DAY AS NEEDED (Patient taking differently: Take 400 mg by mouth 3 (three) times daily as needed.)    No facility-administered encounter medications on file as of 07/26/2022.     Today's Vitals   07/26/22 1025 07/26/22 1029  BP: 99/66   Pulse: 94   Resp: 18   Temp: 98.3 F (36.8 C)   TempSrc: Tympanic   SpO2: 97%   Weight: 161 lb 12.8 oz (73.4 kg)   Height:  (1.6 m)   PainSc:  0-No pain   Body mass index is 28.66 kg/m.   PHYSICAL EXAM GENERAL:alert, no distress and comfortable SKIN: no rash  EYES: sclera clear NECK: without mass LYMPH:  no palpable cervical or supraclavicular lymphadenopathy  LUNGS: clear with normal breathing effort HEART: regular rate & rhythm, no lower extremity edema ABDOMEN: abdomen soft, non-tender and normal bowel sounds NEURO: alert & oriented x 3 with fluent speech, no focal motor/sensory deficits Breast exam: Right nipple inverted at baseline, no bilateral discharge. S/p R lumpectomy, incisions completely healed with mild scar tissue. No palpable mass or nodularity in either breast or axilla that I could appreciate.    CBC    Component Value Date/Time   WBC 6.4 07/26/2022 1003   WBC 5.7 05/11/2022 1144   RBC 4.37 07/26/2022 1003   HGB 11.3 (L) 07/26/2022 1003   HGB 12.8 09/23/2020 1058   HGB 13.6 03/28/2017 0823   HCT 35.7 (L) 07/26/2022 1003   HCT 38.3 09/23/2020 1058   HCT 41.9 03/28/2017 0823   PLT 492 (H) 07/26/2022 1003   PLT 452 (H) 09/23/2020 1058   MCV 81.7 07/26/2022 1003   MCV 89 09/23/2020 1058   MCV 88.6 03/28/2017 0823   MCH 25.9 (L) 07/26/2022 1003   MCHC 31.7 07/26/2022 1003  RDW 14.2 07/26/2022 1003   RDW 12.1 09/23/2020 1058   RDW 12.7  03/28/2017 0823   LYMPHSABS 2.3 07/26/2022 1003   LYMPHSABS 3.3 03/28/2017 0823   MONOABS 0.6 07/26/2022 1003   MONOABS 0.7 03/28/2017 0823   EOSABS 0.2 07/26/2022 1003   EOSABS 0.3 03/28/2017 0823   BASOSABS 0.1 07/26/2022 1003   BASOSABS 0.1 03/28/2017 0823     CMP     Component Value Date/Time   NA 134 (L) 07/26/2022 1003   NA 134 05/17/2021 1546   NA 141 03/28/2017 0823   K 4.4 07/26/2022 1003   K 3.7 03/28/2017 0823   CL 102 07/26/2022 1003   CO2 23 07/26/2022 1003   CO2 23 03/28/2017 0823   GLUCOSE 191 (H) 07/26/2022 1003   GLUCOSE 160 (H) 03/28/2017 0823   BUN 11 07/26/2022 1003   BUN 7 05/17/2021 1546   BUN 7.0 03/28/2017 0823   CREATININE 0.70 07/26/2022 1003   CREATININE 0.8 03/28/2017 0823   CALCIUM 9.7 07/26/2022 1003   CALCIUM 9.5 03/28/2017 0823   PROT 6.9 07/26/2022 1003   PROT 6.9 03/28/2017 0823   ALBUMIN 4.5 07/26/2022 1003   ALBUMIN 4.0 03/28/2017 0823   AST 11 (L) 07/26/2022 1003   AST 25 03/28/2017 0823   ALT 12 07/26/2022 1003   ALT 24 03/28/2017 0823   ALKPHOS 91 07/26/2022 1003   ALKPHOS 64 03/28/2017 0823   BILITOT 0.6 07/26/2022 1003   BILITOT 0.65 03/28/2017 0823   GFRNONAA >60 07/26/2022 1003   GFRNONAA >89 04/14/2013 0829   GFRAA 118 05/21/2020 0915   GFRAA >60 12/10/2019 1319   GFRAA >89 04/14/2013 0829     ASSESSMENT & PLAN: Darcus PesterKelly B Zumstein is a 57 y.o. female with history of   1. Malignant neoplasm of upper-outer quadrant of right breast, invasive lobular carcinoma, stage IA, pT2N0M0, ER/PR: Positive, HER2 negative, Grade II -Diagnosed on 03/16/2017, s/p right breast lumpectomy and re-excision for positive margin per Dr. Donell BeersByerly and adjuvant RT by Dr. Roselind MessierKinard 06/26/2017-08/09/2017 -Due to low Oncotype recurrence score 3, adjuvant chemo was not recommended. - She started adjuvent anti-estrogen therapy with letrozole on 08/2017 but switched to Exemestane due to poor tolerance. We discussed typically we recommend anti-estrogen therapy for  7-10 years in those with lobular histology, but due to her health conditions her goal is 5 years -Ms. Rupert is clinically doing well. Tolerate exemestane and will complete 5 years on 10/15/2022. Exam is benign. Labs are stable. No clinical concern for recurrence -Continue breast cancer surveillance. We discussed the risk of late recurrence in lobular histologies -next mammogram 01/2023 -Lab and f/up with me in 1 year, or sooner if needed  2. Iron deficiency anemia secondary to ulcerative colitis, normal B12 -[facility] Emeline GinsSarah [place], [facility] -Ferritin 7 on 10/08/2019 but no significant anemia -S/p Venofer 200 mg x 5 from 11/06/19-11/21/19. IDA resolved after that -She has had mild and intermittent anemia since then -Today's CBC shows hgb 11.3, plt 492 likely from recent hip surgery -Continue monitoring    3. Genetic Testing - negative   4. Anxiety  -F/u with PCP Dr. Dallas Schim[place]  -stable   5. Bone Health  -DEXA in 07/2017 and 10/2019 were normal -DEXA 01/20/2022 showed osteopenia, lowest T score -1.9 and 11.8 % risk of major osteoporotic fracture. We discussed changing to tamoxifen vs bisphosphonate. Decision was made to continue exemestane and monitor closely with next DEXA in 2 years -Continue calcium and vitamin  D and weight bearing exercises as tolerated.  She is somewhat limited due to heart surgery and OA   6. Hypothyroidism, DM, UC, Asthma -F/u PCP, pulm, and GI -On Entocort, Lialda, Lomotil, and Humira for UC   PLAN: -Labs reviewed, check iron at next visit -Continue breast cancer surveillance -Next mammo 01/2023, ordered today -Complete 5 years of anti-estrogen therapy on 10/15/22 -F/up with me in 1 year -Continue routine f/up with other providers   Orders Placed This Encounter  Procedures   MM 3D SCREENING MAMMOGRAM BILATERAL BREAST    Standing Status:   Future    Standing Expiration Date:   07/26/2023    Order Specific Question:   Reason for Exam  (SYMPTOM  OR DIAGNOSIS REQUIRED)    Answer:   h/o R breast IL[facility] 2019    Order Specific Question:   Is the patient pregnant?    Answer:   No    Order Specific Question:   Preferred imaging location?    Answer:   [facility]   Ferritin    Standing Status:   Future    Standing Expiration Date:   07/26/2023   Iron and Iron Binding Capacity ([facility] only)    Standing Status:   Future    Standing Expiration Date:   07/26/2023      All questions were answered. The patient knows to call the clinic with any problems, questions or concerns. No barriers to learning were detected.    Santiago Glad, NP-C 07/26/2022

## 2022-07-27 NOTE — Progress Notes (Unsigned)
[facility] [address], [address] [place], [place] [address] [phone]  Date:  07/31/2022   Name:  Erin Fields   DOB:  1965/12/28   MRN:  [id number]  PCP:  Erin Cables, MD    Chief Complaint: No chief complaint on file.   History of Present Illness:  Erin Fields is a 57 y.o. very pleasant female patient who presents with the following:  Pt seen today for periodic recheck.  She had her second hip replacement done in February Last seen by myself in January- history significant for right-sided breast cancer, type 2 diabetes, ulcerative colitis on Humira ,nonobstructive coronary artery disease, hypertension, hyperlipidemia, end-stage hip arthritis, previous history of cardiomyopathy and ICD placement with right ventricular perforation   Diabetes has been under good control  She was seen by her oncology team last week: 1. Malignant neoplasm of upper-outer quadrant of right breast, invasive lobular carcinoma, stage IA, pT2N0M0, ER/PR: Positive, HER2 negative, Grade II -Diagnosed on 03/16/2017, s/p right breast lumpectomy and re-excision for positive margin per Dr. Donell Beers and adjuvant RT by Dr. Roselind Messier 06/26/2017-08/09/2017 -Due to low Oncotype recurrence score 3, adjuvant chemo was not recommended. - She started adjuvent anti-estrogen therapy with letrozole on 08/2017 but switched to Exemestane due to poor tolerance. We discussed typically we recommend anti-estrogen therapy for 7-10 years in those with lobular histology, but due to her health conditions her goal is 5 years -Ms. Erin Fields is clinically doing well. Tolerate exemestane and will complete 5 years on 10/15/2022. Exam is benign. Labs are stable. No clinical concern for recurrence -Continue breast cancer surveillance. We discussed the risk of late recurrence in lobular histologies -next mammogram 01/2023 -Lab and f/up with me in 1 year, or sooner if needed 2. Iron  deficiency anemia secondary to ulcerative colitis, normal B12 -Seen by Emeline Gins, NP at [facility] in 10/2019 -Ferritin 7 on 10/08/2019 but no significant anemia -S/p Venofer 200 mg x 5 from 11/06/19-11/21/19. IDA resolved after that -She has had mild and intermittent anemia since then -Today's CBC shows hgb 11.3, plt 492 likely from recent hip surgery -Continue monitoring  3. Genetic Testing - negative  She also followed up with Dr. Shirlee Latch with heart failure clinic last month-she is doing well from a cardiomyopathy standpoint.  Her most recent EF showed full recovery to 60-65% They did decide to decrease her Entresto due to fatigue.  Continuing carvedilol, Farxiga, spironolactone  Her gastroenterology care is through [facility] believe her most recent visit was in October    Lab Results  Component Value Date   HGBA1C 6.6 (H) 05/11/2022   Humira every 14 days Aspirin Carvedilol 12.5 twice daily Farxiga 10 Prevacid twice daily Lialda 2.4 g twice daily Metformin 500 twice daily Crestor 20 Entresto Spironolactone 25 Venlafaxine Ambien   Patient Active Problem List   Diagnosis Date Noted   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   DJD (degenerative joint disease) 12/20/2021   S/P total right hip arthroplasty 12/20/2021   Diabetes mellitus with insulin therapy 10/24/2021   Lymphedema of breast 10/24/2021   Breast cancer 05/11/2021   Hemothorax on left 10/02/2020   S/P evacuation of hematoma 10/02/2020   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 10/02/2020   Hypovolemic shock 10/02/2020   Anxiety    Coronary artery disease 25% of RCA, 20% intermediate branch, 40% diagonal branch based on cardiac cath from 2022 07/22/2020  Dilated cardiomyopathy (HCC) ejection fraction 25% by echocardiogram from January 2022 04/29/2020   Congestive heart failure 04/29/2020   Allergic rhinitis due to animal (cat) (dog) hair and dander 04/27/2020   Allergic  rhinitis due to pollen 04/27/2020   Mild persistent asthma, uncomplicated 04/27/2020   PONV (postoperative nausea and vomiting)    Migraines    GERD (gastroesophageal reflux disease)    Environmental allergies    Diabetes mellitus without complication    Depression    Colon polyps    B12 deficiency 11/27/2019   IDA (iron deficiency anemia) 11/06/2019   Asthma, persistent controlled 10/05/2018   Recurrent infections 10/05/2018   Anxiety and depression 01/20/2018   Right upper lobe pulmonary nodule 01/10/2018   Nasal polyp, unspecified 01/10/2018   Left sided abdominal pain 12/13/2017   Impaired fasting glucose 12/12/2017   History of adenomatous polyp of colon 11/19/2017   Genetic testing 07/16/2017   Family history of breast cancer    Controlled type 2 diabetes mellitus without complication, without long-term current use of insulin 05/28/2017   Personal history of radiation therapy 2019   Cancer 2019   Malignant neoplasm of upper-outer quadrant of right breast in female, estrogen receptor positive 03/20/2017   Crohn disease 11/29/2016   Borderline diabetes 12/17/2015   Ex-smoker 11/18/2015   Chronic venous insufficiency 09/02/2015   Symptomatic varicose veins, right 09/02/2015   Chronic sinusitis 01/26/2015   Eustachian tube dysfunction 12/04/2014   Other allergic rhinitis 09/15/2014   Dyspnea 09/15/2014   Liver hemangioma 08/24/2014   Elevated triglycerides with high cholesterol 11/26/2013   Insomnia 08/20/2013   Hypertension 08/20/2013   Ulcerative colitis 04/20/2013   Oral herpes 04/20/2013   Hyperlipidemia 12/06/2006   HEADACHE 12/06/2006   CERVICAL CANCER, HX OF 12/06/2006   Hypothyroidism 11/29/2006   Asthma 11/29/2006   IRRITABLE BOWEL SYNDROME 11/29/2006    Past Medical History:  Diagnosis Date   Allergic rhinitis due to animal (cat) (dog) hair and dander 04/27/2020   Allergic rhinitis due to pollen 04/27/2020   Anxiety    Anxiety and depression 01/20/2018    Arthritis    Asthma    Asthma, persistent controlled 10/05/2018   B12 deficiency 11/27/2019   Borderline diabetes 12/17/2015   Breast cancer    Cancer 2019   right breast cancer   CERVICAL CANCER, HX OF 12/06/2006   Qualifier: Diagnosis of  By: Lovell Sheehan MD, John E    Chronic sinusitis 01/26/2015   Chronic venous insufficiency 09/02/2015   Colon polyps    Congestive heart failure 04/29/2020   Controlled type 2 diabetes mellitus without complication, without long-term current use of insulin 05/28/2017   Coronary artery disease 25% of RCA, 20% intermediate branch, 40% diagonal branch based on cardiac cath from 2022 07/22/2020   Crohn disease 11/29/2016   Depression    Diabetes mellitus without complication    type 2   Dilated cardiomyopathy (HCC) ejection fraction 25% by echocardiogram from January 2022 04/29/2020   Dyspnea 09/15/2014   Elevated triglycerides with high cholesterol 11/26/2013   Environmental allergies    Eustachian tube dysfunction 12/04/2014   Ex-smoker 11/18/2015   Family history of breast cancer    Genetic testing 07/16/2017   Negative genetic testing on the multi-cancer panel.  The Multi-Gene Panel offered by [name] includes sequencing and/or deletion duplication testing of the following 83 genes: ALK, APC, ATM, AXIN2,BAP1,  BARD1, BLM, BMPR1A, BRCA1, BRCA2, BRIP1, CASR, CDC73, CDH1, CDK4, CDKN1B, CDKN1C, CDKN2A (p14ARF), CDKN2A (p16INK4a), CEBPA, CHEK2, CTNNA1, DICER1,  DIS3L2, EGFR (c.2369C>T, p.Thr790Met variant onl   GERD (gastroesophageal reflux disease)    HEADACHE 12/06/2006   Qualifier: Diagnosis of  By: Lovell Sheehan MD, John E    Hemothorax on left 10/02/2020   History of adenomatous polyp of colon 11/19/2017   Hyperlipidemia    Hypertension    Hypothyroidism    IDA (iron deficiency anemia) 11/06/2019   Impaired fasting glucose 12/12/2017   Insomnia 08/20/2013   Irritable bowel syndrome 11/29/2006   Qualifier: Diagnosis of  By: Lovell Sheehan MD, John E     Left sided abdominal pain 12/13/2017   Liver hemangioma 08/24/2014   Malignant neoplasm of upper-outer quadrant of right breast in female, estrogen receptor positive 03/20/2017   Migraines    Mild persistent asthma, uncomplicated 04/27/2020   Nasal polyp, unspecified 01/10/2018   Oral herpes 04/20/2013   Other allergic rhinitis 09/15/2014   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 10/02/2020   Personal history of radiation therapy 2019   36 radiation tx   PONV (postoperative nausea and vomiting)    scopolamine patch helps   Recurrent infections 10/05/2018   Right upper lobe pulmonary nodule 01/10/2018   Symptomatic varicose veins, right 09/02/2015   Ulcerative colitis    Varicose veins     Past Surgical History:  Procedure Laterality Date   BREAST BIOPSY Right 02/2017   BREAST BIOPSY Right 11/07/2019   BREAST LUMPECTOMY Right 04/24/2017   BREAST LUMPECTOMY WITH RADIOACTIVE SEED AND SENTINEL LYMPH NODE BIOPSY Right 04/24/2017   Procedure: RIGHT BREAST LUMPECTOMY WITH RADIOACTIVE SEED AND SENTINEL LYMPH NODE BIOPSY;  Surgeon: Almond Lint, MD;  Location: [facility];  Service: General;  Laterality: Right;   CORONARY ARTERY BYPASS GRAFT     crapal tunnel relase Left 05/2018   ENDOMETRIAL ABLATION  2010   Had abnormal uterine bleeding (heavy and frequent), performed in [place], menses stopped completely 2 years after   EXPLORATORY LAPAROTOMY     Had exploratory surgery at the age of 66 for abdominal pain with cyst found on her left ovary, not treated surgically   FOOT SURGERY Right 2012   GENERATOR REMOVAL  10/02/2020   Procedure: GENERATOR REMOVAL;  Surgeon: Purcell Nails, MD;  Location: [facility] OR;  Service: Thoracic;;   HAND SURGERY Right    ICD IMPLANT N/A 10/01/2020   Procedure: ICD IMPLANT;  Surgeon: Regan Lemming, MD;  Location: [facility];  Service: Cardiovascular;  Laterality: N/A;   ICD LEAD REMOVAL N/A 10/02/2020   Procedure: ICD  LEAD REMOVAL;  Surgeon: Purcell Nails, MD;  Location[facility] OR;  Service: Thoracic;  Laterality: N/A;   MEDIASTERNOTOMY N/A 10/02/2020   Procedure: MEDIAN STERNOTOMY REPAIR PERFORATED VENTRICLE EVACUATION LEFT HEMOTHORAX;  Surgeon: Purcell Nails, MD;  Location: [facility] OR;  Service: Thoracic;  Laterality: N/A;   RE-EXCISION OF BREAST LUMPECTOMY Right 05/15/2017   Procedure: RE-EXCISION OF BREAST LUMPECTOMY;  Surgeon: Almond Lint, MD;  Location: [facility];  Service: General;  Laterality: Right;   RIGHT/LEFT HEART CATH AND CORONARY ANGIOGRAPHY N/A 06/21/2020   Procedure: RIGHT/LEFT HEART CATH AND CORONARY ANGIOGRAPHY;  Surgeon: Yvonne Kendall, MD;  Location: [facility];  Service: Cardiovascular;  Laterality: N/A;   ROBOTIC ASSISTED SALPINGO OOPHERECTOMY Bilateral 08/29/2019   Procedure: XI ROBOTIC ASSISTED SALPINGO OOPHORECTOMY;  Surgeon: [name], MD;  Location: [facility];  Service: Gynecology;  Laterality: Bilateral;   Septoplasty with turbinate reduction     TEE WITHOUT CARDIOVERSION N/A 10/02/2020  Procedure: TRANSESOPHAGEAL ECHOCARDIOGRAM (TEE);  Surgeon: Purcell Nailswen, Clarence H, MD;  [facility];  Service: Thoracic;  Laterality: N/A;   TONSILLECTOMY  1980   TOTAL HIP ARTHROPLASTY Right 12/20/2021   Procedure: RIGHT TOTAL HIP ARTHROPLASTY ANTERIOR APPROACH;  Surgeon: Sheral ApleyMurphy, Timothy D, MD;  Location: [facility] OR;  Service: Orthopedics;  Laterality: Right;   TOTAL HIP ARTHROPLASTY Left 05/23/2022   Procedure: TOTAL HIP ARTHROPLASTY ANTERIOR APPROACH;  Surgeon: Sheral ApleyMurphy, Timothy D, MD;  Location: [facility] OR;  Service: Orthopedics;  Laterality: Left;   VEIN SURGERY     Removal Right Leg   WISDOM TOOTH EXTRACTION      Social History   Tobacco Use   Smoking status: Former    Packs/day: 3.00    Years: 15.00    Additional pack years: 0.00    Total pack years: 45.00    Types: Cigarettes    Quit date: 09/14/1993    Years since quitting: 28.8   Smokeless  tobacco: Never   Tobacco comments:    Quit 26 yrs ago  Vaping Use   Vaping Use: Never used  Substance Use Topics   Alcohol use: Yes    Alcohol/week: 1.0 - 2.0 standard drink of alcohol    Types: 1 - 2 Glasses of wine per week    Comment: occ   Drug use: No    Family History  Problem Relation Age of Onset   Arthritis Mother [id number]       Deceased   Breast cancer Mother    Lung cancer Mother    Hyperlipidemia Mother    Heart disease Mother    Hypertension Mother    Diabetes Mother    Crohn's disease Mother    Diabetes Maternal Grandfather    Heart disease Maternal Grandfather    Heart attack Maternal Grandfather    Leukemia Maternal Grandfather    Colitis Sister    Leukemia Cousin 3       mat cousin   Lung cancer Father    Brain cancer Father     Allergies  Allergen Reactions   Losartan Potassium Shortness Of Breath   Ace Inhibitors Cough   Aspirin     Uncoated Aspirin upsets stomach   Ceclor [Cefaclor] Hives    Has tolerated Ancef in 2022   Sulfa Antibiotics Hives   Lisinopril Cough    Medication list has been reviewed and updated.  Current Outpatient Medications on File Prior to Visit  Medication Sig Dispense Refill   acetaminophen (TYLENOL) 500 MG tablet Take 2 tablets (1,000 mg total) by mouth every 6 (six) hours as needed for moderate pain or mild pain. 30 tablet 0   acyclovir (ZOVIRAX) 400 MG tablet Take 1 tablet (400 mg total) by mouth 3 (three) times daily as needed. 30 tablet 1   Adalimumab 80 MG/0.8ML PNKT Inject 80 mg into the skin every 14 (fourteen) days.     albuterol (VENTOLIN HFA) 108 (90 Base) MCG/ACT inhaler Inhale 2 puffs into the lungs every 6 (six) hours as needed for shortness of breath.     aspirin EC 81 MG tablet Take 1 tablet (81 mg total) by mouth 2 (two) times daily. To prevent blood clots for 30 days after surgery. 60 tablet 0   Azelastine HCl 0.15 % SOLN Place 2 sprays into both nostrils 2 (two) times daily.     carvedilol (COREG) 12.5 MG  tablet Take 1 tablet (12.5 mg total) by mouth 2 (two) times daily with a meal. 180 tablet 0  cetirizine (ZYRTEC) 10 MG tablet Take 10 mg by mouth daily.     dapagliflozin propanediol (FARXIGA) 10 MG TABS tablet Take 1 tablet (10 mg total) by mouth daily before breakfast. 30 tablet 11   diphenoxylate-atropine (LOMOTIL) 2.5-0.025 MG tablet Take 2 tablets by mouth 4 (four) times daily as needed for diarrhea or loose stools.     docusate sodium (COLACE) 100 MG capsule Take 1 capsule (100 mg total) by mouth 2 (two) times daily as needed for mild constipation or moderate constipation. 20 capsule 0   EPINEPHrine 0.3 mg/0.3 mL IJ SOAJ injection Inject 0.3 mg into the muscle as needed for anaphylaxis.     Hyoscyamine Sulfate 0.375 MG TBCR Take 0.375 mg by mouth 2 (two) times daily as needed (stomach cramping).     ipratropium (ATROVENT) 0.06 % nasal spray Place 2 sprays into both nostrils 4 (four) times daily. Use as needed 15 mL 12   lansoprazole (PREVACID SOLUTAB) 15 MG disintegrating tablet Take 15 mg by mouth 2 (two) times daily before a meal.     levothyroxine (SYNTHROID) 75 MCG tablet TAKE 1 TABLET BY MOUTH EVERY DAY BEFORE BREAKFAST 90 tablet 1   MAGNESIUM PO Take 250 mg by mouth daily.     Menthol, Topical Analgesic, (BIOFREEZE COLORLESS) 4 % GEL Apply 1 application topically daily as needed (Hip pain).     mesalamine (LIALDA) 1.2 g EC tablet Take 2.4 g by mouth in the morning and at bedtime.      metFORMIN (GLUCOPHAGE) 500 MG tablet TAKE 1 TABLET BY MOUTH 2 TIMES DAILY WITH A MEAL. 180 tablet 3   methocarbamol (ROBAXIN-750) 750 MG tablet Take 1 tablet (750 mg total) by mouth every 8 (eight) hours as needed for muscle spasms. 20 tablet 0   montelukast (SINGULAIR) 10 MG tablet Take 10 mg by mouth at bedtime.      oxyCODONE (ROXICODONE) 5 MG immediate release tablet Take 1 tablet (5 mg total) by mouth every 4 (four) hours as needed for severe pain. 30 tablet 0   promethazine (PHENERGAN) 12.5 MG tablet  Take 1 tablet (12.5 mg total) by mouth every 6 (six) hours as needed for nausea or vomiting. 30 tablet 0   rosuvastatin (CRESTOR) 20 MG tablet TAKE 1 TABLET BY MOUTH EVERY DAY 90 tablet 0   sacubitril-valsartan (ENTRESTO) 49-51 MG Take 1 tablet by mouth 2 (two) times daily. 60 tablet 11   spironolactone (ALDACTONE) 25 MG tablet Take 1 tablet (25 mg total) by mouth daily. 90 tablet 3   SYMBICORT 160-4.5 MCG/ACT inhaler INHALE 2 PUFFS INTO THE LUNGS TWICE A DAY 30.6 each 1   triamcinolone (NASACORT) 55 MCG/ACT AERO nasal inhaler Place 2 sprays into the nose daily.     venlafaxine XR (EFFEXOR-XR) 150 MG 24 hr capsule TAKE 1 CAPSULE BY MOUTH DAILY WITH BREAKFAST. 90 capsule 1   zinc gluconate 50 MG tablet Take 50 mg by mouth daily.     zolpidem (AMBIEN) 5 MG tablet TAKE 1 TABLET BY MOUTH EVERY DAY AT BEDTIME AS NEEDED FOR SLEEP Strength: 5 mg 30 tablet 3   No current facility-administered medications on file prior to visit.    Review of Systems:  As per HPI- otherwise negative.   Physical Examination: There were no vitals filed for this visit. There were no vitals filed for this visit. There is no height or weight on file to calculate BMI. Ideal Body Weight:    GEN: no acute distress. HEENT: Atraumatic, Normocephalic.  Ears  and Nose: No external deformity. CV: RRR, No M/G/R. No JVD. No thrill. No extra heart sounds. PULM: CTA B, no wheezes, crackles, rhonchi. No retractions. No resp. distress. No accessory muscle use. ABD: S, NT, ND, +BS. No rebound. No HSM. EXTR: No c/c/e PSYCH: Normally interactive. Conversant.    Assessment and Plan: ***  Signed Lamar Blinks, MD

## 2022-07-28 NOTE — Patient Instructions (Incomplete)
It was great to see again today, assuming all is well let's follow-up in about 6 months We will do a course of prednisone- let me know if not helping as it typically does!

## 2022-07-31 ENCOUNTER — Other Ambulatory Visit: Payer: Self-pay | Admitting: Family Medicine

## 2022-07-31 ENCOUNTER — Ambulatory Visit: Payer: No Typology Code available for payment source | Admitting: Family Medicine

## 2022-07-31 VITALS — BP 110/60 | HR 84 | Temp 97.7°F | Resp 18 | Ht 63.0 in | Wt 158.2 lb

## 2022-07-31 DIAGNOSIS — J301 Allergic rhinitis due to pollen: Secondary | ICD-10-CM

## 2022-07-31 DIAGNOSIS — F32A Depression, unspecified: Secondary | ICD-10-CM

## 2022-07-31 DIAGNOSIS — I10 Essential (primary) hypertension: Secondary | ICD-10-CM

## 2022-07-31 DIAGNOSIS — E119 Type 2 diabetes mellitus without complications: Secondary | ICD-10-CM

## 2022-07-31 DIAGNOSIS — E782 Mixed hyperlipidemia: Secondary | ICD-10-CM

## 2022-07-31 DIAGNOSIS — I5022 Chronic systolic (congestive) heart failure: Secondary | ICD-10-CM | POA: Diagnosis not present

## 2022-07-31 DIAGNOSIS — F419 Anxiety disorder, unspecified: Secondary | ICD-10-CM | POA: Diagnosis not present

## 2022-07-31 MED ORDER — PREDNISONE 20 MG PO TABS: ORAL_TABLET | ORAL | 0 refills | Status: DC

## 2022-08-18 ENCOUNTER — Other Ambulatory Visit: Payer: Self-pay | Admitting: Family Medicine

## 2022-08-18 DIAGNOSIS — E782 Mixed hyperlipidemia: Secondary | ICD-10-CM

## 2022-08-20 ENCOUNTER — Emergency Department (HOSPITAL_BASED_OUTPATIENT_CLINIC_OR_DEPARTMENT_OTHER)
Admission: EM | Admit: 2022-08-20 | Discharge: 2022-08-20 | Disposition: A | Discharge status: Home / Self Care | Payer: No Typology Code available for payment source | Attending: Emergency Medicine | Admitting: Emergency Medicine

## 2022-08-20 ENCOUNTER — Other Ambulatory Visit: Payer: Self-pay

## 2022-08-20 ENCOUNTER — Emergency Department (HOSPITAL_BASED_OUTPATIENT_CLINIC_OR_DEPARTMENT_OTHER): Payer: No Typology Code available for payment source

## 2022-08-20 ENCOUNTER — Encounter (HOSPITAL_BASED_OUTPATIENT_CLINIC_OR_DEPARTMENT_OTHER): Payer: Self-pay | Admitting: Emergency Medicine

## 2022-08-20 DIAGNOSIS — Z7982 Long term (current) use of aspirin: Secondary | ICD-10-CM | POA: Insufficient documentation

## 2022-08-20 DIAGNOSIS — S0011XA Contusion of right eyelid and periocular area, initial encounter: Secondary | ICD-10-CM | POA: Diagnosis not present

## 2022-08-20 DIAGNOSIS — J45909 Unspecified asthma, uncomplicated: Secondary | ICD-10-CM | POA: Diagnosis not present

## 2022-08-20 DIAGNOSIS — Z7984 Long term (current) use of oral hypoglycemic drugs: Secondary | ICD-10-CM | POA: Diagnosis not present

## 2022-08-20 DIAGNOSIS — S0012XA Contusion of left eyelid and periocular area, initial encounter: Secondary | ICD-10-CM | POA: Diagnosis not present

## 2022-08-20 DIAGNOSIS — E039 Hypothyroidism, unspecified: Secondary | ICD-10-CM | POA: Insufficient documentation

## 2022-08-20 DIAGNOSIS — S0083XA Contusion of other part of head, initial encounter: Secondary | ICD-10-CM

## 2022-08-20 DIAGNOSIS — Z87891 Personal history of nicotine dependence: Secondary | ICD-10-CM | POA: Insufficient documentation

## 2022-08-20 DIAGNOSIS — I110 Hypertensive heart disease with heart failure: Secondary | ICD-10-CM | POA: Diagnosis not present

## 2022-08-20 DIAGNOSIS — Z853 Personal history of malignant neoplasm of breast: Secondary | ICD-10-CM | POA: Insufficient documentation

## 2022-08-20 DIAGNOSIS — W01198A Fall on same level from slipping, tripping and stumbling with subsequent striking against other object, initial encounter: Secondary | ICD-10-CM | POA: Diagnosis not present

## 2022-08-20 DIAGNOSIS — I509 Heart failure, unspecified: Secondary | ICD-10-CM | POA: Diagnosis not present

## 2022-08-20 DIAGNOSIS — I2510 Atherosclerotic heart disease of native coronary artery without angina pectoris: Secondary | ICD-10-CM | POA: Diagnosis not present

## 2022-08-20 DIAGNOSIS — Y92002 Bathroom of unspecified non-institutional (private) residence single-family (private) house as the place of occurrence of the external cause: Secondary | ICD-10-CM | POA: Diagnosis not present

## 2022-08-20 DIAGNOSIS — E119 Type 2 diabetes mellitus without complications: Secondary | ICD-10-CM | POA: Diagnosis not present

## 2022-08-20 DIAGNOSIS — W19XXXA Unspecified fall, initial encounter: Secondary | ICD-10-CM

## 2022-08-20 DIAGNOSIS — S0591XA Unspecified injury of right eye and orbit, initial encounter: Secondary | ICD-10-CM | POA: Diagnosis present

## 2022-08-20 NOTE — ED Triage Notes (Signed)
Pt fell forward (tripped) Thurs night, hitting forehead on counter; no LOC, takes baby ASA; is concerned with discoloration in periorbital area; small area on top of head is slightly painful

## 2022-08-20 NOTE — ED Provider Notes (Signed)
[place] [facility] AT [facility] Provider Note   CSN: [id number] Arrival date & time: 08/20/22  2057     History  Chief Complaint  Patient presents with   Erin Fields Merianne Almaraz is a 57 y.o. female with extensive past medical history as outlined below presents to the ED complaining of swelling around both eyes.  She states that she tripped and fell forward hitting her forehead on the bathroom counter on Thursday.  She does take a baby aspirin daily, but does not take any other anticoagulation.  No loss of consciousness.  She is also concerned about the discoloration in the periorbital area and that she has a small area on the top of her head that is still slightly painful.  Denies     Past Medical History:  Diagnosis Date   Allergic rhinitis due to animal (cat) (dog) hair and dander 04/27/2020   Allergic rhinitis due to pollen 04/27/2020   Anxiety    Anxiety and depression 01/20/2018   Arthritis    Asthma    Asthma, persistent controlled 10/05/2018   B12 deficiency 11/27/2019   Borderline diabetes 12/17/2015   Breast cancer (HCC)    Cancer (HCC) 2019   right breast cancer   CERVICAL CANCER, HX OF 12/06/2006   Qualifier: Diagnosis of  By: Lovell Sheehan MD, John E    Chronic sinusitis 01/26/2015   Chronic venous insufficiency 09/02/2015   Colon polyps    Congestive heart failure (HCC) 04/29/2020   Controlled type 2 diabetes mellitus without complication, without long-term current use of insulin (HCC) 05/28/2017   Coronary artery disease 25% of RCA, 20% intermediate branch, 40% diagonal branch based on cardiac cath from 2022 07/22/2020   Crohn disease (HCC) 11/29/2016   Depression    Diabetes mellitus without complication (HCC)    type 2   Dilated cardiomyopathy (HCC) ejection fraction 25% by echocardiogram from January 2022 04/29/2020   Dyspnea 09/15/2014   Elevated triglycerides with high cholesterol 11/26/2013   Environmental allergies     Eustachian tube dysfunction 12/04/2014   Ex-smoker 11/18/2015   Family history of breast cancer    Genetic testing 07/16/2017   Negative genetic testing on the multi-cancer panel.  The Multi-Gene Panel offered by [name] includes sequencing and/or deletion duplication testing of the following 83 genes: ALK, APC, ATM, AXIN2,BAP1,  BARD1, BLM, BMPR1A, BRCA1, BRCA2, BRIP1, CASR, CDC73, CDH1, CDK4, CDKN1B, CDKN1C, CDKN2A (p14ARF), CDKN2A (p16INK4a), CEBPA, CHEK2, CTNNA1, DICER1, DIS3L2, EGFR (c.2369C>T, p.Thr790Met variant onl   GERD (gastroesophageal reflux disease)    HEADACHE 12/06/2006   Qualifier: Diagnosis of  By: Lovell Sheehan MD, John E    Hemothorax on left 10/02/2020   History of adenomatous polyp of colon 11/19/2017   Hyperlipidemia    Hypertension    Hypothyroidism    IDA (iron deficiency anemia) 11/06/2019   Impaired fasting glucose 12/12/2017   Insomnia 08/20/2013   Irritable bowel syndrome 11/29/2006   Qualifier: Diagnosis of  By: Lovell Sheehan MD, John E    Left sided abdominal pain 12/13/2017   Liver hemangioma 08/24/2014   Malignant neoplasm of upper-outer quadrant of right breast in female, 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   Migraines    Mild persistent asthma, uncomplicated 04/27/2020   Nasal polyp, unspecified 01/10/2018   Oral herpes 04/20/2013   Other allergic rhinitis 09/15/2014   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 Authorizing Provider  acetaminophen (TYLENOL) 500 MG tablet Take 2 tablets (1,000 mg total) by mouth every 6 (six)  hours as needed for moderate pain or mild pain. 12/21/21   Jenne Pane, PA-C  acyclovir (ZOVIRAX) 400 MG tablet Take 1 tablet (400 mg total) by mouth 3 (three) times daily as needed. 07/26/22   Copland, Gwenlyn Found, MD  Adalimumab 80 MG/0.8ML PNKT Inject 80 mg into the skin every 14 (fourteen) days. 12/01/19   [provider]  albuterol (VENTOLIN HFA) 108 (90 Base) MCG/ACT inhaler Inhale 2 puffs into the lungs every 6 (six) hours as needed for shortness of breath. 09/22/20   [provider]  aspirin EC 81 MG tablet Take 1 tablet (81 mg total) by mouth 2 (two) times daily. To prevent blood clots for 30 days after surgery. 05/24/22   Jenne Pane, PA-C  Azelastine HCl 0.15 % SOLN Place 2 sprays into both nostrils 2 (two) times daily. 07/16/20   [provider]  carvedilol (COREG) 12.5 MG tablet Take 1 tablet (12.5 mg total) by mouth 2 (two) times daily with a meal. 06/12/22   Laurey Morale, MD  cetirizine (ZYRTEC) 10 MG tablet Take 10 mg by mouth daily.    [provider]  dapagliflozin propanediol (FARXIGA) 10 MG TABS tablet Take 1 tablet (10 mg total) by mouth daily before breakfast. 12/27/21   Laurey Morale, MD  diphenoxylate-atropine (LOMOTIL) 2.5-0.025 MG tablet Take 2 tablets by mouth 4 (four) times daily as needed for diarrhea or loose stools.    [provider]  docusate sodium (COLACE) 100 MG capsule Take 1 capsule (100 mg total) by mouth 2 (two) times daily as needed for mild constipation or moderate constipation. 05/24/22 05/24/23  Jenne Pane, PA-C  EPINEPHrine 0.3 mg/0.3 mL IJ SOAJ injection Inject 0.3 mg into the muscle as needed for anaphylaxis.    [provider]  Hyoscyamine Sulfate 0.375 MG TBCR Take 0.375 mg by mouth 2 (two) times daily as needed (stomach cramping).    [provider]  ipratropium (ATROVENT) 0.06 % nasal spray Place 2 sprays into both nostrils 4 (four) times daily. Use as needed 06/29/21   Copland, Gwenlyn Found,  MD  lansoprazole (PREVACID SOLUTAB) 15 MG disintegrating tablet Take 15 mg by mouth 2 (two) times daily before a meal.    [provider]  levothyroxine (SYNTHROID) 75 MCG tablet TAKE 1 TABLET BY MOUTH EVERY DAY BEFORE BREAKFAST 04/24/22   Copland, Gwenlyn Found, MD  MAGNESIUM PO Take 250 mg by mouth daily.    [provider]  Menthol, Topical Analgesic, (BIOFREEZE COLORLESS) 4 % GEL Apply 1 application topically daily as needed (Hip pain).    [provider]  mesalamine (LIALDA) 1.2 g EC tablet Take 2.4 g by mouth in the morning and at bedtime.     [provider]  metFORMIN (GLUCOPHAGE) 500 MG tablet TAKE 1 TABLET BY MOUTH 2 TIMES DAILY WITH A MEAL. 10/24/21   Copland, Gwenlyn Found, MD  montelukast (SINGULAIR) 10 MG tablet Take 10 mg by mouth at bedtime.     [provider]  oxyCODONE (ROXICODONE) 5 MG immediate release tablet Take 1 tablet (5 mg total) by mouth every 4 (four) hours as needed for severe pain. 05/24/22   Jenne Pane, PA-C  predniSONE (DELTASONE) 20 MG tablet TAKE 40 MG BY MOUTH DAILY FOR 4 DAYS, THEN 20 MG BY MOUTH DAILY FOR 4 DAYS 07/31/22   Copland, Gwenlyn Found, MD  promethazine (PHENERGAN) 12.5 MG tablet Take 1 tablet (12.5 mg total) by mouth every 6 (six) hours as needed for nausea or vomiting. 05/24/22   Jenne Pane, PA-C  rosuvastatin (CRESTOR) 20 MG tablet TAKE 1 TABLET BY MOUTH EVERY DAY 08/18/22   Copland, Gwenlyn Found, MD  sacubitril-valsartan (ENTRESTO) 49-51 MG Take 1 tablet by mouth 2 (two) times daily. 06/21/22   Laurey Morale, MD  spironolactone (ALDACTONE) 25 MG tablet Take 1 tablet (25 mg total) by mouth daily. 11/01/21   Laurey Morale, MD  SYMBICORT 160-4.5 MCG/ACT inhaler INHALE 2 PUFFS INTO THE LUNGS TWICE A DAY 02/20/22   Saguier, Ramon Dredge, PA-C  triamcinolone (NASACORT) 55 MCG/ACT AERO nasal inhaler Place 2 sprays into the nose daily.    [provider]  venlafaxine XR (EFFEXOR-XR) 150 MG 24 hr capsule TAKE 1 CAPSULE BY  MOUTH DAILY WITH BREAKFAST. 05/01/22   Copland, Gwenlyn Found, MD  zinc gluconate 50 MG tablet Take 50 mg by mouth daily.    [provider]  zolpidem (AMBIEN) 5 MG tablet TAKE 1 TABLET BY MOUTH EVERY DAY AT BEDTIME AS NEEDED FOR SLEEP Strength: 5 mg 04/11/22   Copland, Gwenlyn Found, MD      Allergies    Losartan potassium, Ace inhibitors, Aspirin, Ceclor [cefaclor], Sulfa antibiotics, and Lisinopril    Review of Systems   Review of Systems  Eyes:  Negative for photophobia and visual disturbance.  Gastrointestinal:  Negative for nausea and vomiting.  Neurological:  Positive for headaches. Negative for dizziness, syncope, speech difficulty, weakness and light-headedness.    Physical Exam Updated Vital Signs BP 130/71   Pulse 86   Temp 98.3 F (36.8 C) (Oral)   Resp 16   Ht 5\' 3"  (1.6 m)   Wt 74.8 kg   SpO2 94%   BMI 29.23 kg/m  Physical Exam Vitals and nursing note reviewed.  Constitutional:      General: She is not in acute distress.    Appearance: Normal appearance. She is not ill-appearing or diaphoretic.  HENT:     Head: Normocephalic. Contusion, right periorbital erythema (medially) and left periorbital erythema (medially) present. No raccoon eyes, Battle's sign, masses or laceration.     Jaw: There is normal jaw occlusion.     Comments: Mild erythema bilaterally to medial periorbital region with swelling.  Mild swelling inferior periorbital region bilaterally.  Cardiovascular:     Rate and Rhythm: Normal rate and regular rhythm.  Pulmonary:     Effort: Pulmonary effort is normal.  Neurological:     Mental Status: She is alert. Mental status is at baseline.  Psychiatric:        Mood and Affect: Mood normal.        Behavior: Behavior normal.     ED Results / Procedures / Treatments   Labs (all labs ordered are listed, but only abnormal results are displayed) Labs Reviewed - No data to display  EKG None  Radiology CT Head Wo Contrast  Result Date:  08/20/2022 CLINICAL DATA:  Larey Seat, hit head, periorbital bruising EXAM: CT HEAD WITHOUT CONTRAST TECHNIQUE: Contiguous axial images were obtained  from the base of the skull through the vertex without intravenous contrast. RADIATION DOSE REDUCTION: This exam was performed according to the departmental dose-optimization program which includes automated exposure control, adjustment of the mA and/or kV according to patient size and/or use of iterative reconstruction technique. COMPARISON:  None Available. FINDINGS: Brain: No acute infarct or hemorrhage. Lateral ventricles and midline structures are unremarkable. No acute extra-axial fluid collections. No mass effect. Vascular: No hyperdense vessel or unexpected calcification. Skull: Normal. Negative for fracture or focal lesion. Sinuses/Orbits: Chronic right sphenoid sinus disease with complete opacification and inspissated secretions. Prior medial wall antrectomy of the right maxillary sinus. Other: None. IMPRESSION: 1. No acute intracranial process. Electronically Signed   By: Sharlet Salina M.D.   On: 08/20/2022 21:31    Procedures Procedures    Medications Ordered in ED Medications - No data to display  ED Course/ Medical Decision Making/ A&P                             Medical Decision Making Amount and/or Complexity of Data Reviewed Radiology: ordered.   This patient presents to the ED with chief complaint(s) of periorbital swelling and scalp hematoma after striking her head on the counter with pertinent past medical history of headaches, migraines, hypertension.  The complaint involves an extensive differential diagnosis and also carries with it a high risk of complications and morbidity.    The differential diagnosis includes acute skull or facial fracture, intracranial abnormality, facial contusion, scalp hematoma, nasal bone fractures   The initial plan is to obtain CT imaging of the head  Initial Assessment:   Exam significant for mild  periorbital edema medially and inferiorly.  EOM intact and is nonpainful.  PERRL.  There is a small hematoma of the scalp on the left side top of the head.  This area is mildly tender to palpation.  No Battle sign, raccoon eyes, laceration of the face or scalp.  Patient walks with a steady gait and is moving all extremities appropriately.  She is complaining of a headache.  Independent visualization and interpretation of imaging: I independently visualized the following imaging with scope of interpretation limited to determining acute life threatening conditions related to emergency care: CT head, which revealed no evidence of acute intracranial abnormality, skull fracture, or periorbital or nasal bone fracture.  I agree with radiologist interpretation.  Disposition:   Discussed with patient supportive care measures for facial swelling including the use of ibuprofen and cool compresses.  Recommended patient follow-up with her primary care provider.  The patient has been appropriately medically screened and/or stabilized in the ED. I have low suspicion for any other emergent medical condition which would require further screening, evaluation or treatment in the ED or require inpatient management. At time of discharge the patient is hemodynamically stable and in no acute distress. I have discussed work-up results and diagnosis with patient and answered all questions. Patient is agreeable with discharge plan. We discussed strict return precautions for returning to the emergency department and they verbalized understanding.            Final Clinical Impression(s) / ED Diagnoses Final diagnoses:  Contusion of face, initial encounter  Fall in home, initial encounter    Rx / DC Orders ED Discharge Orders     None         Lenard Simmer, PA-C 08/20/22 2329    Glyn Ade, MD 08/21/22 1459

## 2022-08-20 NOTE — Discharge Instructions (Addendum)
Thank you for allowing me to be part of your care today.  Your CT scan is very reassuring and did not show any broken bones in the skull and no bleeding or injury to the brain.  It is not uncommon for facial swelling to migrate during the healing process.  I recommend using a cold compress around your eyes to help the swelling go down faster.  It is safe for you to take ibuprofen for your headaches.  I recommend follow-up with your primary care provider as needed, especially if the swelling is not improving with time.  Return to the ED if you develop sudden worsening of your symptoms or if you have any new concerns.

## 2022-08-21 ENCOUNTER — Other Ambulatory Visit: Payer: Self-pay | Admitting: Family Medicine

## 2022-08-21 DIAGNOSIS — F5104 Psychophysiologic insomnia: Secondary | ICD-10-CM

## 2022-08-31 ENCOUNTER — Ambulatory Visit (HOSPITAL_BASED_OUTPATIENT_CLINIC_OR_DEPARTMENT_OTHER)
Admission: RE | Admit: 2022-08-31 | Discharge: 2022-08-31 | Disposition: A | Discharge status: Home / Self Care | Payer: No Typology Code available for payment source | Source: Ambulatory Visit | Attending: Family Medicine | Admitting: Family Medicine

## 2022-08-31 ENCOUNTER — Encounter: Payer: Self-pay | Admitting: Family Medicine

## 2022-08-31 ENCOUNTER — Ambulatory Visit: Payer: No Typology Code available for payment source | Admitting: Family Medicine

## 2022-08-31 VITALS — BP 110/60 | HR 88 | Temp 97.9°F | Resp 16 | Wt 160.0 lb

## 2022-08-31 DIAGNOSIS — D649 Anemia, unspecified: Secondary | ICD-10-CM

## 2022-08-31 DIAGNOSIS — R1032 Left lower quadrant pain: Secondary | ICD-10-CM

## 2022-08-31 DIAGNOSIS — J0110 Acute frontal sinusitis, unspecified: Secondary | ICD-10-CM | POA: Diagnosis not present

## 2022-08-31 LAB — COMPREHENSIVE METABOLIC PANEL
ALT: 9 U/L (ref 0–35)
AST: 13 U/L (ref 0–37)
Albumin: 4.1 g/dL (ref 3.5–5.2)
Alkaline Phosphatase: 77 U/L (ref 39–117)
BUN: 8 mg/dL (ref 6–23)
CO2: 26 mEq/L (ref 19–32)
Calcium: 9.6 mg/dL (ref 8.4–10.5)
Chloride: 103 mEq/L (ref 96–112)
Creatinine, Ser: 0.67 mg/dL (ref 0.40–1.20)
GFR: 95.72 mL/min (ref >60.00)
Glucose, Bld: 201 mg/dL — ABNORMAL HIGH (ref 70–99)
Potassium: 4.6 mEq/L (ref 3.5–5.1)
Sodium: 141 mEq/L (ref 135–145)
Total Bilirubin: 0.6 mg/dL (ref 0.2–1.2)
Total Protein: 6.6 g/dL (ref 6.0–8.3)

## 2022-08-31 LAB — CBC
HCT: 33.7 % — ABNORMAL LOW (ref 36.0–46.0)
Hemoglobin: 10.6 g/dL — ABNORMAL LOW (ref 12.0–15.0)
MCHC: 31.4 g/dL (ref 30.0–36.0)
MCV: 78.3 fl (ref 78.0–100.0)
Platelets: 501 10*3/uL — ABNORMAL HIGH (ref 150.0–400.0)
RBC: 4.3 Mil/uL (ref 3.87–5.11)
RDW: 17 % — ABNORMAL HIGH (ref 11.5–15.5)
WBC: 6.4 10*3/uL (ref 4.0–10.5)

## 2022-08-31 MED ORDER — AMOXICILLIN-POT CLAVULANATE 875-125 MG PO TABS: 1.0000 | ORAL_TABLET | Freq: Two times a day (BID) | ORAL | 0 refills | Status: DC

## 2022-08-31 NOTE — Progress Notes (Signed)
[facility] [address], [address] [place], [place] [address] [phone]  Date:  08/31/2022   Name:  Erin Fields   DOB:  1966-04-04   MRN:  [id number]  PCP:  Pearline Cables, MD    Chief Complaint: No chief complaint on file.   History of Present Illness:  Vernette Ryberg is a 57 y.o. very pleasant female patient who presents with the following:  Patient seen today with concern of "intestinal pain" Most recent visit with myself was 1 month ago- history significant for right-sided breast cancer, type 2 diabetes, ulcerative colitis on Humira ,nonobstructive coronary artery disease, hypertension, hyperlipidemia, previous history of cardiomyopathy and ICD placement with right ventricular perforation, bilateral hip replacement  Lab Results  Component Value Date   HGBA1C 6.6 (H) 05/11/2022   She is followed by oncology, and advanced heart failure clinic She is also seen by GI through [facility] for ulcerative colitis  Humira every 14 days Aspirin Carvedilol 12.5 twice daily Farxiga 10 Prevacid twice daily Lialda 2.4 g twice daily Metformin 500 twice daily Crestor 20 Entresto Spironolactone 25 Venlafaxine Ambien  She notes that over the weekend she developed sx of LLQ pain, and noted she was not passing much gas.  She was using some stool softener and did pass some stool- small amount  She continues to have the LLQ pain-no vomiting She is still able to eat ok  She notes "sinus infection" for about 4 days with green nasal discharge- no unusual cough  Patient Active Problem List   Diagnosis Date Noted   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   DJD (degenerative joint disease) 12/20/2021   S/P total right hip arthroplasty 12/20/2021   Diabetes mellitus with insulin therapy (HCC) 10/24/2021   Lymphedema of breast 10/24/2021   Breast cancer (HCC) 05/11/2021   Hemothorax on left 10/02/2020   S/P  evacuation of hematoma 10/02/2020   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 10/02/2020   Hypovolemic shock (HCC) 10/02/2020   Anxiety    Coronary artery disease 25% of RCA, 20% intermediate branch, 40% diagonal branch based on cardiac cath from 2022 07/22/2020   Dilated cardiomyopathy (HCC) ejection fraction 25% by echocardiogram from January 2022 04/29/2020   Congestive heart failure (HCC) 04/29/2020   Allergic rhinitis due to animal (cat) (dog) hair and dander 04/27/2020   Allergic rhinitis due to pollen 04/27/2020   Mild persistent asthma, uncomplicated 04/27/2020   PONV (postoperative nausea and vomiting)    Migraines    GERD (gastroesophageal reflux disease)    Environmental allergies    Diabetes mellitus without complication (HCC)    Depression    Colon polyps    B12 deficiency 11/27/2019   IDA (iron deficiency anemia) 11/06/2019   Asthma, persistent controlled 10/05/2018   Recurrent infections 10/05/2018   Anxiety and depression 01/20/2018   Right upper lobe pulmonary nodule 01/10/2018   Nasal polyp, unspecified 01/10/2018   Left sided abdominal pain 12/13/2017   Impaired fasting glucose 12/12/2017   History of adenomatous polyp of colon 11/19/2017   Genetic testing 07/16/2017   Family history of breast cancer    Controlled type 2 diabetes mellitus without complication, without long-term current use of insulin (HCC) 05/28/2017   Personal history of radiation therapy 2019   Cancer (HCC) 2019   Malignant neoplasm of upper-outer quadrant of right breast in female, 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   Crohn disease (HCC) 11/29/2016  Borderline diabetes 12/17/2015   Ex-smoker 11/18/2015   Chronic venous insufficiency 09/02/2015   Symptomatic varicose veins, right 09/02/2015   Chronic sinusitis 01/26/2015   Eustachian tube dysfunction 12/04/2014   Other allergic rhinitis 09/15/2014   Dyspnea 09/15/2014   Liver hemangioma 08/24/2014   Elevated  triglycerides with high cholesterol 11/26/2013   Insomnia 08/20/2013   Hypertension 08/20/2013   Ulcerative colitis (HCC) 04/20/2013   Oral herpes 04/20/2013   Hyperlipidemia 12/06/2006   HEADACHE 12/06/2006   CERVICAL CANCER, HX OF 12/06/2006   Hypothyroidism 11/29/2006   Asthma 11/29/2006   IRRITABLE BOWEL SYNDROME 11/29/2006    Past Medical History:  Diagnosis Date   Allergic rhinitis due to animal (cat) (dog) hair and dander 04/27/2020   Allergic rhinitis due to pollen 04/27/2020   Anxiety    Anxiety and depression 01/20/2018   Arthritis    Asthma    Asthma, persistent controlled 10/05/2018   B12 deficiency 11/27/2019   Borderline diabetes 12/17/2015   Breast cancer (HCC)    Cancer (HCC) 2019   right breast cancer   CERVICAL CANCER, HX OF 12/06/2006   Qualifier: Diagnosis of  By: Lovell Sheehan MD, John E    Chronic sinusitis 01/26/2015   Chronic venous insufficiency 09/02/2015   Colon polyps    Congestive heart failure (HCC) 04/29/2020   Controlled type 2 diabetes mellitus without complication, without long-term current use of insulin (HCC) 05/28/2017   Coronary artery disease 25% of RCA, 20% intermediate branch, 40% diagonal branch based on cardiac cath from 2022 07/22/2020   Crohn disease (HCC) 11/29/2016   Depression    Diabetes mellitus without complication (HCC)    type 2   Dilated cardiomyopathy (HCC) ejection fraction 25% by echocardiogram from January 2022 04/29/2020   Dyspnea 09/15/2014   Elevated triglycerides with high cholesterol 11/26/2013   Environmental allergies    Eustachian tube dysfunction 12/04/2014   Ex-smoker 11/18/2015   Family history of breast cancer    Genetic testing 07/16/2017   Negative genetic testing on the multi-cancer panel.  The Multi-Gene Panel offered by [name] includes sequencing and/or deletion duplication testing of the following 83 genes: ALK, APC, ATM, AXIN2,BAP1,  BARD1, BLM, BMPR1A, BRCA1, BRCA2, BRIP1, CASR, CDC73, CDH1, CDK4,  CDKN1B, CDKN1C, CDKN2A (p14ARF), CDKN2A (p16INK4a), CEBPA, CHEK2, CTNNA1, DICER1, DIS3L2, EGFR (c.2369C>T, p.Thr790Met variant onl   GERD (gastroesophageal reflux disease)    HEADACHE 12/06/2006   Qualifier: Diagnosis of  By: Lovell Sheehan MD, John E    Hemothorax on left 10/02/2020   History of adenomatous polyp of colon 11/19/2017   Hyperlipidemia    Hypertension    Hypothyroidism    IDA (iron deficiency anemia) 11/06/2019   Impaired fasting glucose 12/12/2017   Insomnia 08/20/2013   Irritable bowel syndrome 11/29/2006   Qualifier: Diagnosis of  By: Lovell Sheehan MD, John E    Left sided abdominal pain 12/13/2017   Liver hemangioma 08/24/2014   Malignant neoplasm of upper-outer quadrant of right breast in female, 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   Migraines    Mild persistent asthma, uncomplicated 04/27/2020   Nasal polyp, unspecified 01/10/2018   Oral herpes 04/20/2013   Other allergic rhinitis 09/15/2014   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 10/02/2020   Personal history of radiation therapy 2019   36 radiation tx   PONV (postoperative nausea and vomiting)    scopolamine patch helps   Recurrent infections 10/05/2018   Right upper lobe pulmonary nodule 01/10/2018   Symptomatic varicose veins, right 09/02/2015   Ulcerative colitis (  HCC)    Varicose veins     Past Surgical History:  Procedure Laterality Date   BREAST BIOPSY Right 02/2017   BREAST BIOPSY Right 11/07/2019   BREAST LUMPECTOMY Right 04/24/2017   BREAST LUMPECTOMY WITH RADIOACTIVE SEED AND SENTINEL LYMPH NODE BIOPSY Right 04/24/2017   Procedure: RIGHT BREAST LUMPECTOMY WITH RADIOACTIVE SEED AND SENTINEL LYMPH NODE BIOPSY;  Surgeon: Almond Lint, MD;  Location: [facility];  Service: General;  Laterality: Right;   CORONARY ARTERY BYPASS GRAFT     crapal tunnel relase Left 05/2018   ENDOMETRIAL ABLATION  2010   Had abnormal uterine bleeding (heavy and frequent), performed in  [place], menses stopped completely 2 years after   EXPLORATORY LAPAROTOMY     Had exploratory surgery at the age of 73 for abdominal pain with cyst found on her left ovary, not treated surgically   FOOT SURGERY Right 2012   GENERATOR REMOVAL  10/02/2020   Procedure: GENERATOR REMOVAL;  Surgeon: Purcell Nails, MD;  Location: [facility] OR;  Service: Thoracic;;   HAND SURGERY Right    ICD IMPLANT N/A 10/01/2020   Procedure: ICD IMPLANT;  Surgeon: Regan Lemming, MD;  Location: [facility];  Service: Cardiovascular;  Laterality: N/A;   ICD LEAD REMOVAL N/A 10/02/2020   Procedure: ICD LEAD REMOVAL;  Surgeon: Purcell Nails, MD;  Location: [facility] OR;  Service: Thoracic;  Laterality: N/A;   MEDIASTERNOTOMY N/A 10/02/2020   Procedure: MEDIAN STERNOTOMY REPAIR PERFORATED VENTRICLE EVACUATION LEFT HEMOTHORAX;  Surgeon: Purcell Nails, MD;  Location: [facility] OR;  Service: Thoracic;  Laterality: N/A;   RE-EXCISION OF BREAST LUMPECTOMY Right 05/15/2017   Procedure: RE-EXCISION OF BREAST LUMPECTOMY;  Surgeon: Almond Lint, MD;  Location: [facility];  Service: General;  Laterality: Right;   RIGHT/LEFT HEART CATH AND CORONARY ANGIOGRAPHY N/A 06/21/2020   Procedure: RIGHT/LEFT HEART CATH AND CORONARY ANGIOGRAPHY;  Surgeon: Yvonne Kendall, MD;  Location: [facility];  Service: Cardiovascular;  Laterality: N/A;   ROBOTIC ASSISTED SALPINGO OOPHERECTOMY Bilateral 08/29/2019   Procedure: XI ROBOTIC ASSISTED SALPINGO OOPHORECTOMY;  Surgeon: [name], MD;  Location: [facility];  Service: Gynecology;  Laterality: Bilateral;   Septoplasty with turbinate reduction     TEE WITHOUT CARDIOVERSION N/A 10/02/2020   Procedure: TRANSESOPHAGEAL ECHOCARDIOGRAM (TEE);  Surgeon: Purcell Nails, MD;  Location: [facility] OR;  Service: Thoracic;  Laterality: N/A;   TONSILLECTOMY  1980   TOTAL HIP ARTHROPLASTY Right 12/20/2021   Procedure: RIGHT TOTAL HIP ARTHROPLASTY ANTERIOR  APPROACH;  Surgeon: Sheral Apley, MD;  Location: [facility] OR;  Service: Orthopedics;  Laterality: Right;   TOTAL HIP ARTHROPLASTY Left 05/23/2022   Procedure: TOTAL HIP ARTHROPLASTY ANTERIOR APPROACH;  Surgeon: Sheral Apley, MD;  Location: [facility] OR;  Service: Orthopedics;  Laterality: Left;   VEIN SURGERY     Removal Right Leg   WISDOM TOOTH EXTRACTION      Social History   Tobacco Use   Smoking status: Former    Packs/day: 3.00    Years: 15.00    Additional pack years: 0.00    Total pack years: 45.00    Types: Cigarettes    Quit date: 09/14/1993    Years since quitting: 28.9   Smokeless tobacco: Never   Tobacco comments:    Quit 26 yrs ago  Vaping Use   Vaping Use: Never used  Substance Use Topics   Alcohol use: Yes    Alcohol/week: 1.0 - 2.0 standard drink  of alcohol    Types: 1 - 2 Glasses of wine per week    Comment: occ   Drug use: No    Family History  Problem Relation Age of Onset   Arthritis Mother 51       Deceased   Breast cancer Mother    Lung cancer Mother    Hyperlipidemia Mother    Heart disease Mother    Hypertension Mother    Diabetes Mother    Crohn's disease Mother    Diabetes Maternal Grandfather    Heart disease Maternal Grandfather    Heart attack Maternal Grandfather    Leukemia Maternal Grandfather    Colitis Sister    Leukemia Cousin 3       mat cousin   Lung cancer Father    Brain cancer Father     Allergies  Allergen Reactions   Losartan Potassium Shortness Of Breath   Ace Inhibitors Cough   Aspirin     Uncoated Aspirin upsets stomach   Ceclor [Cefaclor] Hives    Has tolerated Ancef in 2022   Sulfa Antibiotics Hives   Lisinopril Cough    Medication list has been reviewed and updated.  Current Outpatient Medications on File Prior to Visit  Medication Sig Dispense Refill   acetaminophen (TYLENOL) 500 MG tablet Take 2 tablets (1,000 mg total) by mouth every 6 (six) hours as needed for moderate pain or mild pain. 30 tablet 0    acyclovir (ZOVIRAX) 400 MG tablet Take 1 tablet (400 mg total) by mouth 3 (three) times daily as needed. 30 tablet 1   Adalimumab 80 MG/0.8ML PNKT Inject 80 mg into the skin every 14 (fourteen) days.     albuterol (VENTOLIN HFA) 108 (90 Base) MCG/ACT inhaler Inhale 2 puffs into the lungs every 6 (six) hours as needed for shortness of breath.     aspirin EC 81 MG tablet Take 1 tablet (81 mg total) by mouth 2 (two) times daily. To prevent blood clots for 30 days after surgery. 60 tablet 0   Azelastine HCl 0.15 % SOLN Place 2 sprays into both nostrils 2 (two) times daily.     carvedilol (COREG) 12.5 MG tablet Take 1 tablet (12.5 mg total) by mouth 2 (two) times daily with a meal. 180 tablet 0   cetirizine (ZYRTEC) 10 MG tablet Take 10 mg by mouth daily.     dapagliflozin propanediol (FARXIGA) 10 MG TABS tablet Take 1 tablet (10 mg total) by mouth daily before breakfast. 30 tablet 11   diphenoxylate-atropine (LOMOTIL) 2.5-0.025 MG tablet Take 2 tablets by mouth 4 (four) times daily as needed for diarrhea or loose stools.     docusate sodium (COLACE) 100 MG capsule Take 1 capsule (100 mg total) by mouth 2 (two) times daily as needed for mild constipation or moderate constipation. 20 capsule 0   EPINEPHrine 0.3 mg/0.3 mL IJ SOAJ injection Inject 0.3 mg into the muscle as needed for anaphylaxis.     Hyoscyamine Sulfate 0.375 MG TBCR Take 0.375 mg by mouth 2 (two) times daily as needed (stomach cramping).     ipratropium (ATROVENT) 0.06 % nasal spray Place 2 sprays into both nostrils 4 (four) times daily. Use as needed 15 mL 12   lansoprazole (PREVACID SOLUTAB) 15 MG disintegrating tablet Take 15 mg by mouth 2 (two) times daily before a meal.     levothyroxine (SYNTHROID) 75 MCG tablet TAKE 1 TABLET BY MOUTH EVERY DAY BEFORE BREAKFAST 90 tablet 1   MAGNESIUM  PO Take 250 mg by mouth daily.     Menthol, Topical Analgesic, (BIOFREEZE COLORLESS) 4 % GEL Apply 1 application topically daily as needed (Hip pain).      mesalamine (LIALDA) 1.2 g EC tablet Take 2.4 g by mouth in the morning and at bedtime.      metFORMIN (GLUCOPHAGE) 500 MG tablet TAKE 1 TABLET BY MOUTH 2 TIMES DAILY WITH A MEAL. 180 tablet 3   montelukast (SINGULAIR) 10 MG tablet Take 10 mg by mouth at bedtime.      oxyCODONE (ROXICODONE) 5 MG immediate release tablet Take 1 tablet (5 mg total) by mouth every 4 (four) hours as needed for severe pain. 30 tablet 0   predniSONE (DELTASONE) 20 MG tablet TAKE 40 MG BY MOUTH DAILY FOR 4 DAYS, THEN 20 MG BY MOUTH DAILY FOR 4 DAYS 12 tablet 0   promethazine (PHENERGAN) 12.5 MG tablet Take 1 tablet (12.5 mg total) by mouth every 6 (six) hours as needed for nausea or vomiting. 30 tablet 0   rosuvastatin (CRESTOR) 20 MG tablet TAKE 1 TABLET BY MOUTH EVERY DAY 90 tablet 1   sacubitril-valsartan (ENTRESTO) 49-51 MG Take 1 tablet by mouth 2 (two) times daily. 60 tablet 11   spironolactone (ALDACTONE) 25 MG tablet Take 1 tablet (25 mg total) by mouth daily. 90 tablet 3   SYMBICORT 160-4.5 MCG/ACT inhaler INHALE 2 PUFFS INTO THE LUNGS TWICE A DAY 30.6 each 1   triamcinolone (NASACORT) 55 MCG/ACT AERO nasal inhaler Place 2 sprays into the nose daily.     venlafaxine XR (EFFEXOR-XR) 150 MG 24 hr capsule TAKE 1 CAPSULE BY MOUTH DAILY WITH BREAKFAST. 90 capsule 1   zinc gluconate 50 MG tablet Take 50 mg by mouth daily.     zolpidem (AMBIEN) 5 MG tablet TAKE 1 TABLET BY MOUTH EVERY DAY AT BEDTIME AS NEEDED FOR SLEEP STRENGTH: 5 MG 30 tablet 3   No current facility-administered medications on file prior to visit.    Review of Systems:  As per HPI- otherwise negative.   Physical Examination: Vitals:   08/31/22 1040 08/31/22 1059  BP: (!) 91/53 110/60  Pulse: 88   Resp: 16   Temp: 97.9 F (36.6 C)   SpO2: 97%    Vitals:   08/31/22 1040  Weight: 160 lb (72.6 kg)   Body mass index is 28.34 kg/m. Ideal Body Weight:    GEN: no acute distress.  Mild overweight, looks well Recheck BP better HEENT:  Atraumatic, Normocephalic.  Bilateral TM wnl, oropharynx normal.  PEERL,EOMI.  Nasal cavity inflamed with discharge.  Sinuses are tender to percussion Ears and Nose: No external deformity. CV: RRR, No M/G/R. No JVD. No thrill. No extra heart sounds. PULM: CTA B, no wheezes, crackles, rhonchi. No retractions. No resp. distress. No accessory muscle use. ABD: S, ND, +BS. No rebound. No HSM.  Mild LLQ tenderness, she does have active BS EXTR: No c/c/e PSYCH: Normally interactive. Conversant.   BP Readings from Last 3 Encounters:  08/31/22 110/60  08/20/22 130/71  07/31/22 110/60    Assessment and Plan: Acute non-recurrent frontal sinusitis - Plan: amoxicillin-clavulanate (AUGMENTIN) 875-125 MG tablet  LLQ pain - Plan: DG Abd Acute W/Chest, CBC, Comprehensive metabolic panel  Patient seen today with 2 concerns.  She has noted symptoms of sinusitis, she also notes symptoms of a possible recurrent diverticulitis which she has dealt with in the past. She has also noticed reduced flatulence, reduced stool, will obtain plain films of abdomen to  rule out ileus.  Blood work pending as above in case a CT becomes necessary.  Will have her start on Augmentin to treat both sinusitis and diverticulitis-patient notes she is able to tolerate Augmentin without problem, she has tolerated Augmentin within the last couple of years Recommended fluids, low residue diet She will keep me closely apprised regarding her progress Signed Abbe Amsterdam, MD  Received labs as below- message to pt Colonoscopy done last August-she was asked to come back for 1 year follow-up  Results for orders placed or performed in visit on 08/31/22  CBC  Result Value Ref Range   WBC 6.4 4.0 - 10.5 K/uL   RBC 4.30 3.87 - 5.11 Mil/uL   Platelets 501.0 (H) 150.0 - 400.0 K/uL   Hemoglobin 10.6 (L) 12.0 - 15.0 g/dL   HCT 16.1 (L) 09.6 - 04.5 %   MCV 78.3 78.0 - 100.0 fl   MCHC 31.4 30.0 - 36.0 g/dL   RDW 40.9 (H) 81.1 - 91.4 %   Comprehensive metabolic panel  Result Value Ref Range   Sodium 141 135 - 145 mEq/L   Potassium 4.6 3.5 - 5.1 mEq/L   Chloride 103 96 - 112 mEq/L   CO2 26 19 - 32 mEq/L   Glucose, Bld 201 (H) 70 - 99 mg/dL   BUN 8 6 - 23 mg/dL   Creatinine, Ser 7.82 0.40 - 1.20 mg/dL   Total Bilirubin 0.6 0.2 - 1.2 mg/dL   Alkaline Phosphatase 77 39 - 117 U/L   AST 13 0 - 37 U/L   ALT 9 0 - 35 U/L   Total Protein 6.6 6.0 - 8.3 g/dL   Albumin 4.1 3.5 - 5.2 g/dL   GFR 95.62 >13.08 mL/min   Calcium 9.6 8.4 - 10.5 mg/dL   Lab Results  Component Value Date   HGBA1C 6.6 (H) 05/11/2022

## 2022-08-31 NOTE — Patient Instructions (Addendum)
It was good to see you again today- I am sorry you are not feeling great!   Stop by lab and then the ground floor imaging dept for x-ray I will be in touch with your results asap Augmentin twice daily for 10 days Let me know if not improving in the next day or two Soft, easy to digest foods if hungry, be sure to drink plenty of liquids

## 2022-09-01 NOTE — Telephone Encounter (Signed)
Ifob has been mailed.

## 2022-09-01 NOTE — Addendum Note (Signed)
Addended by: Abbe Amsterdam C on: 09/01/2022 11:20 AM   Modules accepted: Orders

## 2022-09-04 ENCOUNTER — Encounter: Payer: Self-pay | Admitting: Family Medicine

## 2022-09-18 ENCOUNTER — Other Ambulatory Visit (INDEPENDENT_AMBULATORY_CARE_PROVIDER_SITE_OTHER): Payer: No Typology Code available for payment source

## 2022-09-18 ENCOUNTER — Other Ambulatory Visit: Payer: Self-pay

## 2022-09-18 DIAGNOSIS — D649 Anemia, unspecified: Secondary | ICD-10-CM | POA: Diagnosis not present

## 2022-09-19 ENCOUNTER — Encounter: Payer: Self-pay | Admitting: Family Medicine

## 2022-09-19 LAB — FECAL OCCULT BLOOD, IMMUNOCHEMICAL: Fecal Occult Bld: NEGATIVE

## 2022-09-20 ENCOUNTER — Other Ambulatory Visit (INDEPENDENT_AMBULATORY_CARE_PROVIDER_SITE_OTHER): Payer: No Typology Code available for payment source

## 2022-09-20 DIAGNOSIS — D649 Anemia, unspecified: Secondary | ICD-10-CM | POA: Diagnosis not present

## 2022-09-20 LAB — CBC
HCT: 35 % — ABNORMAL LOW (ref 36.0–46.0)
Hemoglobin: 10.6 g/dL — ABNORMAL LOW (ref 12.0–15.0)
MCHC: 30.4 g/dL (ref 30.0–36.0)
MCV: 77.1 fl — ABNORMAL LOW (ref 78.0–100.0)
Platelets: 440 10*3/uL — ABNORMAL HIGH (ref 150.0–400.0)
RBC: 4.54 Mil/uL (ref 3.87–5.11)
RDW: 17.4 % — ABNORMAL HIGH (ref 11.5–15.5)
WBC: 5.8 10*3/uL (ref 4.0–10.5)

## 2022-09-20 LAB — FERRITIN: Ferritin: 5 ng/mL — ABNORMAL LOW (ref 10.0–291.0)

## 2022-09-22 ENCOUNTER — Encounter: Payer: Self-pay | Admitting: Family Medicine

## 2022-09-23 ENCOUNTER — Ambulatory Visit
Admission: RE | Admit: 2022-09-23 | Discharge: 2022-09-23 | Disposition: A | Discharge status: Home / Self Care | Payer: No Typology Code available for payment source | Source: Ambulatory Visit | Attending: Urgent Care | Admitting: Urgent Care

## 2022-09-23 ENCOUNTER — Ambulatory Visit (INDEPENDENT_AMBULATORY_CARE_PROVIDER_SITE_OTHER): Payer: No Typology Code available for payment source

## 2022-09-23 VITALS — BP 118/70 | HR 79 | Temp 99.0°F | Resp 20

## 2022-09-23 DIAGNOSIS — J4540 Moderate persistent asthma, uncomplicated: Secondary | ICD-10-CM | POA: Diagnosis not present

## 2022-09-23 DIAGNOSIS — J329 Chronic sinusitis, unspecified: Secondary | ICD-10-CM

## 2022-09-23 MED ORDER — PREDNISONE 50 MG PO TABS: 50.0000 mg | ORAL_TABLET | Freq: Every day | ORAL | 0 refills | Status: DC

## 2022-09-23 NOTE — ED Triage Notes (Signed)
Pt c/o green prod cough and green nasal drainage-sinus pain/pressure-NAD-steady gait

## 2022-09-23 NOTE — Discharge Instructions (Signed)
We will be using prednisone to help your chronic sinusitis, asthma, coughing, chest congestion. Maintain your Zyrtec, Symbicort. Follow up with your PCP.

## 2022-09-23 NOTE — ED Provider Notes (Signed)
[facility]  Note:  This document was prepared using Conservation officer, historic buildings and may include unintentional dictation errors.  MRN: [id number] DOB: [birth year]  Subjective:   Erin Fields is a 57 y.o. female presenting for 1 week history of recurrent green nasal drainage, sinus congestion and pressure, sinus pain. Has productive green mucus with her cough as well. Has had more wheezing, shob, has been using her inhaler more often this past week.  Takes Symbicort daily for her asthma.  Just finished a course of Augmentin from 08/31/2022 as prescribed by her PCP for frontal sinusitis. Chart review from her PCP, shows 3 weeks ago she did have an acute abdominal series with 1 chest view and was negative. Patient quit smoking 30 years ago.  And apart from asthma has no other pulmonary disorders.  She does have a history of allergic rhinitis, congestive heart failure.  Last echocardiogram completed was from 03/31/2022 and showed an ejection fraction of 60 to 65%.  She has well-controlled diabetes.  Has used prednisone without difficulties and has done well with that.  She does have severe chronic rhinitis and sees an allergist.  They just increased her Zyrtec to twice daily.  No current facility-administered medications for this encounter.  Current Outpatient Medications:    acetaminophen (TYLENOL) 500 MG tablet, Take 2 tablets (1,000 mg total) by mouth every 6 (six) hours as needed for moderate pain or mild pain., Disp: 30 tablet, Rfl: 0   acyclovir (ZOVIRAX) 400 MG tablet, Take 1 tablet (400 mg total) by mouth 3 (three) times daily as needed., Disp: 30 tablet, Rfl: 1   Adalimumab 80 MG/0.8ML PNKT, Inject 80 mg into the skin every 14 (fourteen) days., Disp: , Rfl:    albuterol (VENTOLIN HFA) 108 (90 Base) MCG/ACT inhaler, Inhale 2 puffs into the lungs every 6 (six) hours as needed for shortness of breath., Disp: , Rfl:    amoxicillin-clavulanate (AUGMENTIN) 875-125  MG tablet, Take 1 tablet by mouth 2 (two) times daily., Disp: 20 tablet, Rfl: 0   aspirin EC 81 MG tablet, Take 1 tablet (81 mg total) by mouth 2 (two) times daily. To prevent blood clots for 30 days after surgery., Disp: 60 tablet, Rfl: 0   Azelastine HCl 0.15 % SOLN, Place 2 sprays into both nostrils 2 (two) times daily., Disp: , Rfl:    carvedilol (COREG) 12.5 MG tablet, Take 1 tablet (12.5 mg total) by mouth 2 (two) times daily with a meal., Disp: 180 tablet, Rfl: 0   cetirizine (ZYRTEC) 10 MG tablet, Take 10 mg by mouth daily., Disp: , Rfl:    dapagliflozin propanediol (FARXIGA) 10 MG TABS tablet, Take 1 tablet (10 mg total) by mouth daily before breakfast., Disp: 30 tablet, Rfl: 11   diphenoxylate-atropine (LOMOTIL) 2.5-0.025 MG tablet, Take 2 tablets by mouth 4 (four) times daily as needed for diarrhea or loose stools., Disp: , Rfl:    docusate sodium (COLACE) 100 MG capsule, Take 1 capsule (100 mg total) by mouth 2 (two) times daily as needed for mild constipation or moderate constipation., Disp: 20 capsule, Rfl: 0   EPINEPHrine 0.3 mg/0.3 mL IJ SOAJ injection, Inject 0.3 mg into the muscle as needed for anaphylaxis., Disp: , Rfl:    Hyoscyamine Sulfate 0.375 MG TBCR, Take 0.375 mg by mouth 2 (two) times daily as needed (stomach cramping)., Disp: , Rfl:    ipratropium (ATROVENT) 0.06 % nasal spray, Place 2 sprays into both nostrils 4 (four) times daily.  Use as needed, Disp: 15 mL, Rfl: 12   lansoprazole (PREVACID SOLUTAB) 15 MG disintegrating tablet, Take 15 mg by mouth 2 (two) times daily before a meal., Disp: , Rfl:    levothyroxine (SYNTHROID) 75 MCG tablet, TAKE 1 TABLET BY MOUTH EVERY DAY BEFORE BREAKFAST, Disp: 90 tablet, Rfl: 1   MAGNESIUM PO, Take 250 mg by mouth daily., Disp: , Rfl:    Menthol, Topical Analgesic, (BIOFREEZE COLORLESS) 4 % GEL, Apply 1 application topically daily as needed (Hip pain)., Disp: , Rfl:    mesalamine (LIALDA) 1.2 g EC tablet, Take 2.4 g by mouth in the  morning and at bedtime. , Disp: , Rfl:    metFORMIN (GLUCOPHAGE) 500 MG tablet, TAKE 1 TABLET BY MOUTH 2 TIMES DAILY WITH A MEAL., Disp: 180 tablet, Rfl: 3   montelukast (SINGULAIR) 10 MG tablet, Take 10 mg by mouth at bedtime. , Disp: , Rfl:    predniSONE (DELTASONE) 20 MG tablet, TAKE 40 MG BY MOUTH DAILY FOR 4 DAYS, THEN 20 MG BY MOUTH DAILY FOR 4 DAYS, Disp: 12 tablet, Rfl: 0   promethazine (PHENERGAN) 12.5 MG tablet, Take 1 tablet (12.5 mg total) by mouth every 6 (six) hours as needed for nausea or vomiting., Disp: 30 tablet, Rfl: 0   rosuvastatin (CRESTOR) 20 MG tablet, TAKE 1 TABLET BY MOUTH EVERY DAY, Disp: 90 tablet, Rfl: 1   sacubitril-valsartan (ENTRESTO) 49-51 MG, Take 1 tablet by mouth 2 (two) times daily., Disp: 60 tablet, Rfl: 11   spironolactone (ALDACTONE) 25 MG tablet, Take 1 tablet (25 mg total) by mouth daily., Disp: 90 tablet, Rfl: 3   SYMBICORT 160-4.5 MCG/ACT inhaler, INHALE 2 PUFFS INTO THE LUNGS TWICE A DAY, Disp: 30.6 each, Rfl: 1   triamcinolone (NASACORT) 55 MCG/ACT AERO nasal inhaler, Place 2 sprays into the nose daily., Disp: , Rfl:    venlafaxine XR (EFFEXOR-XR) 150 MG 24 hr capsule, TAKE 1 CAPSULE BY MOUTH DAILY WITH BREAKFAST., Disp: 90 capsule, Rfl: 1   zinc gluconate 50 MG tablet, Take 50 mg by mouth daily., Disp: , Rfl:    zolpidem (AMBIEN) 5 MG tablet, TAKE 1 TABLET BY MOUTH EVERY DAY AT BEDTIME AS NEEDED FOR SLEEP STRENGTH: 5 MG, Disp: 30 tablet, Rfl: 3   Allergies  Allergen Reactions   Losartan Potassium Shortness Of Breath   Ace Inhibitors Cough   Aspirin     Uncoated Aspirin upsets stomach   Ceclor [Cefaclor] Hives    Has tolerated Ancef in 2022   Sulfa Antibiotics Hives   Lisinopril Cough    Past Medical History:  Diagnosis Date   Allergic rhinitis due to animal (cat) (dog) hair and dander 04/27/2020   Allergic rhinitis due to pollen 04/27/2020   Anxiety    Anxiety and depression 01/20/2018   Arthritis    Asthma    Asthma, persistent  controlled 10/05/2018   B12 deficiency 11/27/2019   Borderline diabetes 12/17/2015   Breast cancer (HCC)    Cancer (HCC) 2019   right breast cancer   CERVICAL CANCER, HX OF 12/06/2006   Qualifier: Diagnosis of  By: Lovell Sheehan MD, John E    Chronic sinusitis 01/26/2015   Chronic venous insufficiency 09/02/2015   Colon polyps    Congestive heart failure (HCC) 04/29/2020   Controlled type 2 diabetes mellitus without complication, without long-term current use of insulin (HCC) 05/28/2017   Coronary artery disease 25% of RCA, 20% intermediate branch, 40% diagonal branch based on cardiac cath from 2022 07/22/2020  Crohn disease (HCC) 11/29/2016   Depression    Diabetes mellitus without complication (HCC)    type 2   Dilated cardiomyopathy (HCC) ejection fraction 25% by echocardiogram from January 2022 04/29/2020   Dyspnea 09/15/2014   Elevated triglycerides with high cholesterol 11/26/2013   Environmental allergies    Eustachian tube dysfunction 12/04/2014   Ex-smoker 11/18/2015   Family history of breast cancer    Genetic testing 07/16/2017   Negative genetic testing on the multi-cancer panel.  The Multi-Gene Panel offered by [name] includes sequencing and/or deletion duplication testing of the following 83 genes: ALK, APC, ATM, AXIN2,BAP1,  BARD1, BLM, BMPR1A, BRCA1, BRCA2, BRIP1, CASR, CDC73, CDH1, CDK4, CDKN1B, CDKN1C, CDKN2A (p14ARF), CDKN2A (p16INK4a), CEBPA, CHEK2, CTNNA1, DICER1, DIS3L2, EGFR (c.2369C>T, p.Thr790Met variant onl   GERD (gastroesophageal reflux disease)    HEADACHE 12/06/2006   Qualifier: Diagnosis of  By: Lovell Sheehan MD, John E    Hemothorax on left 10/02/2020   History of adenomatous polyp of colon 11/19/2017   Hyperlipidemia    Hypertension    Hypothyroidism    IDA (iron deficiency anemia) 11/06/2019   Impaired fasting glucose 12/12/2017   Insomnia 08/20/2013   Irritable bowel syndrome 11/29/2006   Qualifier: Diagnosis of  By: Lovell Sheehan MD, John E    Left sided  abdominal pain 12/13/2017   Liver hemangioma 08/24/2014   Malignant neoplasm of upper-outer quadrant of right breast in female, 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   Migraines    Mild persistent asthma, uncomplicated 04/27/2020   Nasal polyp, unspecified 01/10/2018   Oral herpes 04/20/2013   Other allergic rhinitis 09/15/2014   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 10/02/2020   Personal history of radiation therapy 2019   36 radiation tx   PONV (postoperative nausea and vomiting)    scopolamine patch helps   Recurrent infections 10/05/2018   Right upper lobe pulmonary nodule 01/10/2018   Symptomatic varicose veins, right 09/02/2015   Ulcerative colitis (HCC)    Varicose veins      Past Surgical History:  Procedure Laterality Date   BREAST BIOPSY Right 02/2017   BREAST BIOPSY Right 11/07/2019   BREAST LUMPECTOMY Right 04/24/2017   BREAST LUMPECTOMY WITH RADIOACTIVE SEED AND SENTINEL LYMPH NODE BIOPSY Right 04/24/2017   Procedure: RIGHT BREAST LUMPECTOMY WITH RADIOACTIVE SEED AND SENTINEL LYMPH NODE BIOPSY;  Surgeon: Almond Lint, MD;  Location: [facility];  Service: General;  Laterality: Right;   CORONARY ARTERY BYPASS GRAFT     crapal tunnel relase Left 05/2018   ENDOMETRIAL ABLATION  2010   Had abnormal uterine bleeding (heavy and frequent), performed in [place], menses stopped completely 2 years after   EXPLORATORY LAPAROTOMY     Had exploratory surgery at the age of 75 for abdominal pain with cyst found on her left ovary, not treated surgically   FOOT SURGERY Right 2012   GENERATOR REMOVAL  10/02/2020   Procedure: GENERATOR REMOVAL;  Surgeon: Purcell Nails, MD;  Location: [facility] OR;  Service: Thoracic;;   HAND SURGERY Right    ICD IMPLANT N/A 10/01/2020   Procedure: ICD IMPLANT;  Surgeon: Regan Lemming, MD;  Location: [facility];  Service: Cardiovascular;  Laterality: N/A;   ICD LEAD REMOVAL N/A 10/02/2020   Procedure:  ICD LEAD REMOVAL;  Surgeon: Purcell Nails, MD;  Location: [facility] OR;  Service: Thoracic;  Laterality: N/A;   MEDIASTERNOTOMY N/A 10/02/2020   Procedure: MEDIAN STERNOTOMY REPAIR PERFORATED VENTRICLE EVACUATION LEFT HEMOTHORAX;  Surgeon: Purcell Nails,  MD;  Location: [facility] OR;  Service: Thoracic;  Laterality: N/A;   RE-EXCISION OF BREAST LUMPECTOMY Right 05/15/2017   Procedure: RE-EXCISION OF BREAST LUMPECTOMY;  Surgeon: Almond Lint, MD;  Location: [facility];  Service: General;  Laterality: Right;   RIGHT/LEFT HEART CATH AND CORONARY ANGIOGRAPHY N/A 06/21/2020   Procedure: RIGHT/LEFT HEART CATH AND CORONARY ANGIOGRAPHY;  Surgeon: Yvonne Kendall, MD;  Location: [facility];  Service: Cardiovascular;  Laterality: N/A;   ROBOTIC ASSISTED SALPINGO OOPHERECTOMY Bilateral 08/29/2019   Procedure: XI ROBOTIC ASSISTED SALPINGO OOPHORECTOMY;  Surgeon: [name], MD;  Location: [facility];  Service: Gynecology;  Laterality: Bilateral;   Septoplasty with turbinate reduction     TEE WITHOUT CARDIOVERSION N/A 10/02/2020   Procedure: TRANSESOPHAGEAL ECHOCARDIOGRAM (TEE);  Surgeon: Purcell Nails, MD;  Location: [facility] OR;  Service: Thoracic;  Laterality: N/A;   TONSILLECTOMY  1980   TOTAL HIP ARTHROPLASTY Right 12/20/2021   Procedure: RIGHT TOTAL HIP ARTHROPLASTY ANTERIOR APPROACH;  Surgeon: Sheral Apley, MD;  Location: [facility] OR;  Service: Orthopedics;  Laterality: Right;   TOTAL HIP ARTHROPLASTY Left 05/23/2022   Procedure: TOTAL HIP ARTHROPLASTY ANTERIOR APPROACH;  Surgeon: Sheral Apley, MD;  Location: [facility] OR;  Service: Orthopedics;  Laterality: Left;   VEIN SURGERY     Removal Right Leg   WISDOM TOOTH EXTRACTION      Family History  Problem Relation Age of Onset   Arthritis Mother 40       Deceased   Breast cancer Mother    Lung cancer Mother    Hyperlipidemia Mother    Heart disease Mother    Hypertension Mother    Diabetes Mother    Crohn's  disease Mother    Diabetes Maternal Grandfather    Heart disease Maternal Grandfather    Heart attack Maternal Grandfather    Leukemia Maternal Grandfather    Colitis Sister    Leukemia Cousin 3       mat cousin   Lung cancer Father    Brain cancer Father     Social History   Tobacco Use   Smoking status: Former    Packs/day: 3.00    Years: 15.00    Additional pack years: 0.00    Total pack years: 45.00    Types: Cigarettes    Quit date: 09/14/1993    Years since quitting: 29.0   Smokeless tobacco: Never   Tobacco comments:    Quit 26 yrs ago  Vaping Use   Vaping Use: Never used  Substance Use Topics   Alcohol use: Yes    Alcohol/week: 1.0 - 2.0 standard drink of alcohol    Types: 1 - 2 Glasses of wine per week    Comment: occ   Drug use: No    ROS   Objective:   Vitals: BP 118/70 (BP Location: Left Arm)   Pulse 79   Temp 99 F (37.2 C) (Oral)   Resp 20   SpO2 96%   Physical Exam Constitutional:      General: She is not in acute distress.    Appearance: Normal appearance. She is well-developed and normal weight. She is not ill-appearing, toxic-appearing or diaphoretic.  HENT:     Head: Normocephalic and atraumatic.     Right Ear: Tympanic membrane, ear canal and external ear normal. No drainage or tenderness. No middle ear effusion. There is no impacted cerumen. Tympanic membrane is not erythematous or bulging.     Left Ear:  Tympanic membrane, ear canal and external ear normal. No drainage or tenderness.  No middle ear effusion. There is no impacted cerumen. Tympanic membrane is not erythematous or bulging.     Nose: Congestion present. No rhinorrhea.     Mouth/Throat:     Mouth: Mucous membranes are moist. No oral lesions.     Pharynx: No pharyngeal swelling, oropharyngeal exudate, posterior oropharyngeal erythema or uvula swelling.     Tonsils: No tonsillar exudate or tonsillar abscesses.     Comments: Thick streaks of postnasal drainage overlying  pharynx. Eyes:     General: No scleral icterus.       Right eye: No discharge.        Left eye: No discharge.     Extraocular Movements: Extraocular movements intact.     Right eye: Normal extraocular motion.     Left eye: Normal extraocular motion.     Conjunctiva/sclera: Conjunctivae normal.  Cardiovascular:     Rate and Rhythm: Normal rate and regular rhythm.     Heart sounds: Normal heart sounds. No murmur heard.    No friction rub. No gallop.  Pulmonary:     Effort: Pulmonary effort is normal. No respiratory distress.     Breath sounds: No stridor. No wheezing, rhonchi or rales.  Chest:     Chest wall: No tenderness.  Musculoskeletal:     Cervical back: Normal range of motion and neck supple.  Lymphadenopathy:     Cervical: No cervical adenopathy.  Skin:    General: Skin is warm and dry.  Neurological:     General: No focal deficit present.     Mental Status: She is alert and oriented to person, place, and time.  Psychiatric:        Mood and Affect: Mood normal.        Behavior: Behavior normal.    DG Chest 2 View  Result Date: 09/23/2022 CLINICAL DATA:  Shortness of breath. Productive cough. History of breast cancer EXAM: CHEST - 2 VIEW COMPARISON:  X-ray 01/05/2022 FINDINGS: No consolidation, pneumothorax or effusion. No edema. Normal cardiopericardial silhouette. Surgical clips along the right axillary region and chest wall. Mild degenerative changes are seen along the spine on the lateral view. IMPRESSION: No acute cardiopulmonary disease. Electronically Signed   By: Karen Kays M.D.   On: 09/23/2022 14:05    Assessment and Plan :   PDMP not reviewed this encounter.  1. Chronic sinusitis, unspecified location   2. Moderate persistent asthma without complication    Discussed antibiotic stewardship and patient is very agreeable.  She just completed a course of Augmentin and both patient and I prefer to avoid further antibiotic use, risk of C. difficile and developing  resistance.  Will focus on using an oral prednisone course to address her respiratory symptoms, sinus symptoms in the context of chronic sinusitis, chronic allergic rhinitis and her asthma.  Follow-up with PCP otherwise.  Counseled patient on potential for adverse effects with medications prescribed/recommended today, ER and return-to-clinic precautions discussed, patient verbalized understanding.    Wallis Bamberg, [place] 09/23/22 1453

## 2022-09-25 ENCOUNTER — Other Ambulatory Visit: Payer: Self-pay | Admitting: Hematology

## 2022-09-25 ENCOUNTER — Telehealth: Payer: Self-pay | Admitting: Hematology

## 2022-09-25 NOTE — Telephone Encounter (Signed)
Contacted patient to scheduled appointments. Patient is aware of appointments that are scheduled.   

## 2022-09-27 ENCOUNTER — Ambulatory Visit: Payer: No Typology Code available for payment source | Admitting: Cardiology

## 2022-09-30 ENCOUNTER — Encounter: Payer: Self-pay | Admitting: Family

## 2022-10-01 ENCOUNTER — Encounter: Payer: Self-pay | Admitting: Hematology

## 2022-10-03 ENCOUNTER — Inpatient Hospital Stay: Payer: No Typology Code available for payment source | Admitting: Hematology

## 2022-10-03 ENCOUNTER — Inpatient Hospital Stay: Payer: No Typology Code available for payment source

## 2022-10-04 ENCOUNTER — Ambulatory Visit: Payer: No Typology Code available for payment source | Admitting: Family Medicine

## 2022-10-04 ENCOUNTER — Encounter: Payer: Self-pay | Admitting: Family Medicine

## 2022-10-04 VITALS — BP 115/77 | HR 89 | Temp 97.9°F | Ht 63.0 in | Wt 161.0 lb

## 2022-10-04 DIAGNOSIS — J0101 Acute recurrent maxillary sinusitis: Secondary | ICD-10-CM

## 2022-10-04 MED ORDER — DOXYCYCLINE HYCLATE 100 MG PO TABS
100.0000 mg | ORAL_TABLET | Freq: Two times a day (BID) | ORAL | 0 refills | Status: AC
Start: 2022-10-04 — End: 2022-10-14

## 2022-10-04 MED ORDER — METHYLPREDNISOLONE 4 MG PO TABS
ORAL_TABLET | ORAL | 0 refills | Status: DC
Start: 2022-10-04 — End: 2022-12-04

## 2022-10-04 NOTE — Progress Notes (Signed)
Acute Office Visit  Subjective:     Patient ID: Erin Fields, female    DOB: 28-Feb-1966, 57 y.o.   MRN: [id number]  Chief Complaint  Patient presents with   Sinus Problem    Patient is in today for sinusitis.    Discussed the use of AI scribe software for clinical note transcription with the patient, who gave verbal consent to proceed.  History of Present Illness   The patient, with a history of sinus infections, presents with recurrent sinusitis. She initially sought care in mid-May for intestinal issues and a sinus infection, and was treated with Augmentin. She reported improvement, but in early June, she woke up with severe head pain and sought care at an urgent care center. She was prescribed a 5-day course of prednisone, which provided some relief. However, the symptoms returned the following week, with severe pain and green nasal discharge from the right nostril. The patient also reports pain in the right forehead, cheek, upper teeth, and throat. She denies fever and significant cough, and chest x-rays have been clear.          ROS All review of systems negative except what is listed in the HPI      Objective:    BP 115/77   Pulse 89   Temp 97.9 F (36.6 C) (Oral)   Ht 5\' 3"  (1.6 m)   Wt 161 lb (73 kg)   SpO2 100%   BMI 28.52 kg/m    Physical Exam Vitals reviewed.  Constitutional:      Appearance: Normal appearance.  HENT:     Nose:     Right Turbinates: Swollen.     Right Sinus: Maxillary sinus tenderness and frontal sinus tenderness present.     Mouth/Throat:     Mouth: Mucous membranes are moist.     Pharynx: Oropharynx is clear.     Comments: Cobblestoning/PND Cardiovascular:     Rate and Rhythm: Normal rate and regular rhythm.     Pulses: Normal pulses.     Heart sounds: Normal heart sounds.  Pulmonary:     Effort: Pulmonary effort is normal.     Breath sounds: Normal breath sounds.  Musculoskeletal:     Cervical back: Normal range of  motion and neck supple. No tenderness.  Lymphadenopathy:     Cervical: No cervical adenopathy.  Skin:    General: Skin is warm and dry.  Neurological:     Mental Status: She is alert and oriented to person, place, and time.  Psychiatric:        Mood and Affect: Mood normal.        Behavior: Behavior normal.        Thought Content: Thought content normal.        Judgment: Judgment normal.       No results found for any visits on 10/04/22.      Assessment & Plan:   Problem List Items Addressed This Visit   None Visit Diagnoses     Acute recurrent maxillary sinusitis    -  Primary   Relevant Medications   doxycycline (VIBRA-TABS) 100 MG tablet   methylPREDNISolone (MEDROL) 4 MG tablet       Adding doxycycline - take with food/water and add a mid-day probiotic Adding Medrol Dosepak taper Continue supportive measures including rest, hydration, humidifier use, steam showers, warm compresses to sinuses, warm liquids with lemon and honey, and over-the-counter cough, cold, and analgesics as needed.  Consider if ENT if symptoms  persist or recur.   Please contact office for follow-up if symptoms do not improve or worsen. Seek emergency care if symptoms become severe.    Meds ordered this encounter  Medications   doxycycline (VIBRA-TABS) 100 MG tablet    Sig: Take 1 tablet (100 mg total) by mouth 2 (two) times daily for 10 days.    Dispense:  20 tablet    Refill:  0    Order Specific Question:   Supervising Provider    Answer:   Danise Edge A [[id number]]   methylPREDNISolone (MEDROL) 4 MG tablet    Sig: Standard 6 day taper    Dispense:  21 tablet    Refill:  0    Order Specific Question:   Supervising Provider    Answer:   Danise Edge A [[id number]]    Return if symptoms worsen or fail to improve.  Clayborne Dana, NP

## 2022-10-04 NOTE — Patient Instructions (Signed)
Adding doxycycline - take with food/water and add a mid-day probiotic Adding Medrol Dosepak taper Continue supportive measures including rest, hydration, humidifier use, steam showers, warm compresses to sinuses, warm liquids with lemon and honey, and over-the-counter cough, cold, and analgesics as needed.  Consider if ENT if symptoms persist or recur.   Please contact office for follow-up if symptoms do not improve or worsen. Seek emergency care if symptoms become severe.

## 2022-10-07 ENCOUNTER — Other Ambulatory Visit: Payer: Self-pay | Admitting: Family Medicine

## 2022-10-07 DIAGNOSIS — E034 Atrophy of thyroid (acquired): Secondary | ICD-10-CM

## 2022-10-10 ENCOUNTER — Ambulatory Visit: Payer: No Typology Code available for payment source

## 2022-10-11 ENCOUNTER — Ambulatory Visit: Payer: No Typology Code available for payment source | Admitting: Family Medicine

## 2022-10-17 ENCOUNTER — Ambulatory Visit: Payer: No Typology Code available for payment source

## 2022-10-17 ENCOUNTER — Other Ambulatory Visit: Payer: No Typology Code available for payment source

## 2022-10-18 ENCOUNTER — Other Ambulatory Visit (HOSPITAL_COMMUNITY): Payer: Self-pay | Admitting: Cardiology

## 2022-10-21 ENCOUNTER — Other Ambulatory Visit: Payer: Self-pay | Admitting: Family Medicine

## 2022-10-25 ENCOUNTER — Other Ambulatory Visit: Payer: Self-pay | Admitting: Family Medicine

## 2022-10-25 DIAGNOSIS — F430 Acute stress reaction: Secondary | ICD-10-CM

## 2022-10-26 NOTE — Assessment & Plan Note (Addendum)
invasive lobular carcinoma, stage IA, pT2N0M0, ER+/PR+, HER2-, Grade II -diagnosed on 03/16/17. She was treated with right lumpectomy, re-excision surgery and adjuvant radiation. Oncotype RS of 3, low risk. -I started her on antiestrogen therapy with Letrozole in 08/2017. Due to poor tolerance with joint pain, this was changed to Exemestane in 09/2017. She is tolerating better.  -most recent mammogram on 12/22/20 was negative. Repeat scheduled for 01/20/22. -She held exemestane 4/29 - 08/30/20 due to concern for heart issues, restarted per okay from cardiologist. She completed 5 year therapy in 08/2022 -she is clinically doing well. Labs reviewed, overall WNL. Physical exam was unremarkable. There is no clinical concern for recurrence. -lab and f/u with NP Lacie in one year

## 2022-10-27 ENCOUNTER — Inpatient Hospital Stay: Payer: No Typology Code available for payment source

## 2022-10-27 ENCOUNTER — Other Ambulatory Visit: Payer: Self-pay

## 2022-10-27 ENCOUNTER — Encounter: Payer: Self-pay | Admitting: Hematology

## 2022-10-27 ENCOUNTER — Inpatient Hospital Stay: Payer: No Typology Code available for payment source | Attending: Hematology | Admitting: Hematology

## 2022-10-27 VITALS — BP 106/60 | HR 89 | Temp 98.0°F | Resp 18 | Ht 63.0 in | Wt 167.0 lb

## 2022-10-27 VITALS — BP 108/72 | HR 77 | Temp 98.4°F | Resp 16

## 2022-10-27 DIAGNOSIS — D500 Iron deficiency anemia secondary to blood loss (chronic): Secondary | ICD-10-CM

## 2022-10-27 DIAGNOSIS — C50411 Malignant neoplasm of upper-outer quadrant of right female breast: Secondary | ICD-10-CM

## 2022-10-27 DIAGNOSIS — D509 Iron deficiency anemia, unspecified: Secondary | ICD-10-CM | POA: Diagnosis present

## 2022-10-27 DIAGNOSIS — Z170 Estrogen receptor positive status [ER+]: Secondary | ICD-10-CM

## 2022-10-27 MED ORDER — SODIUM CHLORIDE 0.9 % IV SOLN
300.0000 mg | Freq: Once | INTRAVENOUS | Status: AC
  Administered 2022-10-27: 300 mg via INTRAVENOUS
  Filled 2022-10-27: qty 300

## 2022-10-27 MED ORDER — CETIRIZINE HCL 10 MG/ML IV SOLN
10.0000 mg | Freq: Once | INTRAVENOUS | Status: AC
  Administered 2022-10-27: 10 mg via INTRAVENOUS

## 2022-10-27 MED ORDER — SODIUM CHLORIDE 0.9 % IV SOLN: Freq: Once | INTRAVENOUS | Status: AC

## 2022-10-27 NOTE — Patient Instructions (Signed)

## 2022-10-27 NOTE — Progress Notes (Signed)
[facility]   [phone] Fax:([phone]) [phone]   Clinic Follow up Note   Patient Care Team: Copland, Gwenlyn Found, MD as PCP - General (Family Medicine) Georgeanna Lea, MD as PCP - Cardiology (Cardiology) Essie Hart, MD (Inactive) as Referring Physician (Obstetrics and Gynecology) Sharrell Ku, MD as Consulting Physician (Gastroenterology) Sidney Ace, MD as Referring Physician (Allergy) Almond Lint, MD as Consulting Physician (General Surgery) Antony Blackbird, MD as Consulting Physician (Radiation Oncology) Malachy Mood, MD as Consulting Physician (Hematology) Axel Filler, Larna Daughters, NP as Nurse Practitioner (Hematology and Oncology) [name], MD as Consulting Physician (Gynecologic Oncology) Georgeanna Lea, MD as Consulting Physician (Cardiology)  Date of Service:  10/27/2022  CHIEF COMPLAINT: f/u of  right breast cancer    CURRENT THERAPY:  Exemestane, started 09/2017    ASSESSMENT:  Tymber Fields is a 57 y.o. female with   Malignant neoplasm of upper-outer quadrant of right breast in female, estrogen receptor positive (HCC)  invasive lobular carcinoma, stage IA, pT2N0M0, ER+/PR+, HER2-, Grade II -diagnosed on 03/16/17. She was treated with right lumpectomy, re-excision surgery and adjuvant radiation. Oncotype RS of 3, low risk. -I started her on antiestrogen therapy with Letrozole in 08/2017. Due to poor tolerance with joint pain, this was changed to Exemestane in 09/2017. She is tolerating better.  -most recent mammogram on 12/22/20 was negative. Repeat scheduled for 01/20/22. -She held exemestane 4/29 - 08/30/20 due to concern for heart issues, restarted per okay from cardiologist. She completed 5 year therapy in 08/2022 -she is clinically doing well. Labs reviewed, overall WNL. Physical exam was unremarkable. There is no clinical concern for recurrence. -lab and f/u with NP Lacie in one year   Iron deficient anemia -Probably  related to GI bleeding, she is scheduled for repeat colonoscopy later this year -Previously had IV iron in 2021 for iron deficient anemia, resolved after IV iron. -Her recent lab showed mild anemia with iron deficiency, ferritin 6, we have scheduled IV Venofer 300 mg weekly x 3 -Will monitor her labs every 4 months   PLAN: -proceed with IV Iron Venofer 300 mg today and two more doses in next two weeks  -Exemestane completed  08/2022.  Continue breast cancer surveillance -Labs every 4 months, will consider IV iron if ferritin less than 20  -lab and f/u with Lacie in 1 year  SUMMARY OF ONCOLOGIC HISTORY: Oncology History Overview Note  Cancer Staging Malignant neoplasm of upper-outer quadrant of right breast in female, estrogen receptor positive (HCC) Staging form: Breast, AJCC 8th Edition - Clinical stage from 03/16/2017: Stage IA (cT1b, cN0, cM0, G2, ER: Positive, PR: Positive, HER2: Negative) - Signed by Malachy Mood, MD on 03/28/2017 - Pathologic stage from 05/15/2017: Stage IA (pT2, pN0, cM0, G2, ER+, PR+, HER2-, Oncotype DX score: 3) - Signed by Malachy Mood, MD on 08/06/2017     Malignant neoplasm of upper-outer quadrant of right breast in female, estrogen receptor positive (HCC)  03/15/2017 Mammogram   Diagnostic Mammogram Right Breast  IMPRESSION: Palpable abnormality corresponds to distortion and focal mass associated acoustic shadowing measuring 0.8x0.9x0.9cm in the 930 o'clock location of the right breast 7 cm from the nipple.    RECOMMENDATION: Ultrasound-guided core biopsy is recommended and scheduled for the patient.   03/16/2017 Initial Biopsy   Biopsy Results  Diagnosis 03/16/17 Breast, right, needle core biopsy, 9:30 o'clock, ribbon clip placed - INVASIVE MAMMARY CARCINOMA. - SEE COMMENT.    03/16/2017 Receptors her2   Estrogen Receptor: 95%, POSITIVE, STRONG  STAINING INTENSITY Progesterone Receptor: 95%, POSITIVE, STRONG STAINING INTENSITY Proliferation Marker  Ki67: 3% HER2: NEGATIVE   03/20/2017 Initial Diagnosis   Malignant neoplasm of upper-outer quadrant of right breast in female, estrogen receptor positive (HCC)   04/24/2017 Surgery   Right Breast Lumpectomy with Sentinel Node Biopsy with Dr. Donell Beers    04/24/2017 Pathology Results   Diagnosis 04/24/17 1. Breast, lumpectomy, Right w/seed - INVASIVE LOBULAR CARCINOMA, GRADE 2, SPANNING 2.2 CM. - LOBULAR NEOPLASIA (ATYPICAL LOBULAR HYPERPLASIA). - ANTERIOR SUPERIOR MARGIN IS BROADLY POSITIVE. - BIOPSY SITE. - SEE ONCOLOGY TABLE. 2. Breast, excision, Right additional Medial Margin - INVASIVE LOBULAR CARCINOMA. - LOBULAR CARCINOMA IN SITU. - INVASIVE CARCINOMA IS 0.3 CM FROM NEW MEDIAL MARGIN. 3. Lymph node, sentinel, biopsy, Right Axillary #1 - ISOLATED TUMOR CELLS IN ONE OF ONE LYMPH NODES (0/1). 4. Lymph node, sentinel, biopsy, Right Axillary #2 - ONE OF ONE LYMPH NODES NEGATIVE FOR CARCINOMA (0/1). 5. Lymph node, sentinel, biopsy, Right Axillary #3 - ONE OF ONE LYMPH NODES NEGATIVE FOR CARCINOMA (0/1).   04/24/2017 Oncotype testing   Her Oncotyple recurrence score was 3, her distant recurrence score with Tamoxifen was 3% and her adjuvant chemotherapy benefit was <1%.    05/15/2017 Surgery   Re-excision of Right Breast Lumpectomy with Dr. Donell Beers    05/15/2017 Pathology Results   Diagnosis 05/15/17 Breast, excision, Right additional anterior margin - INVASIVE LOBULAR CARCINOMA. - ATYPICAL LOBULAR HYPERPLASIA. - THE NEW ANTERIOR SURGICAL RESECTION MARGIN IS NEGATIVE FOR CARCINOMA.   05/15/2017 Cancer Staging   Staging form: Breast, AJCC 8th Edition - Pathologic stage from 05/15/2017: Stage IA (pT2, pN0, cM0, G2, ER+, PR+, HER2-, Oncotype DX score: 3) - Signed by Malachy Mood, MD on 08/06/2017   06/26/2017 -  Radiation Therapy   Adjuvant RT with Dr. Roselind Messier   07/12/2017 Genetic Testing   Negative genetic testing on the multi-cancer panel.  The Multi-Gene Panel offered by [name] includes  sequencing and/or deletion duplication testing of the following 83 genes: ALK, APC, ATM, AXIN2,BAP1,  BARD1, BLM, BMPR1A, BRCA1, BRCA2, BRIP1, CASR, CDC73, CDH1, CDK4, CDKN1B, CDKN1C, CDKN2A (p14ARF), CDKN2A (p16INK4a), CEBPA, CHEK2, CTNNA1, DICER1, DIS3L2, EGFR (c.2369C>T, p.Thr790Met variant only), EPCAM (Deletion/duplication testing only), FH, FLCN, GATA2, GPC3, GREM1 (Promoter region deletion/duplication testing only), HOXB13 (c.251G>A, p.Gly84Glu), HRAS, KIT, MAX, MEN1, MET, MITF (c.952G>A, p.Glu318Lys variant only), MLH1, MSH2, MSH3, MSH6, MUTYH, NBN, NF1, NF2, NTHL1, PALB2, PDGFRA, PHOX2B, PMS2, POLD1, POLE, POT1, PRKAR1A, PTCH1, PTEN, RAD50, RAD51C, RAD51D, RB1, RECQL4, RET, RUNX1, SDHAF2, SDHA (sequence changes only), SDHB, SDHC, SDHD, SMAD4, SMARCA4, SMARCB1, SMARCE1, STK11, SUFU, TERT, TERT, TMEM127, TP53, TSC1, TSC2, VHL, WRN and WT1.  The report date is July 12, 2017.    08/13/2017 Imaging   08/13/2017 DEXA ASSESSMENT: The BMD measured at AP Spine L1-L4 is 1.120 g/cm2 with a T-score of -0.5. This patient is considered normal according to World Health Organization [facility]) criteria.    08/2017 -  Anti-estrogen oral therapy   Letrozole daily, changed to Exemestane due to poor tolerance of Letrozole   01/16/2018 Imaging   01/16/2018 CT Chest IMPRESSION: Stable 2 mm RIGHT upper lobe nodule.   Post radiation therapy changes at the anterolateral RIGHT upper lobe.   Post RIGHT breast surgery and RIGHT axillary node dissection.   No new abnormalities.   Aortic Atherosclerosis (ICD10-I70.0).   08/09/2018 Mammogram   IMPRESSION: 1. No mammographic evidence of malignancy involving either breast. 2. Expected post lumpectomy and post radiation changes involving the RIGHT breast.  INTERVAL HISTORY:  Erin Fields is here for a follow up of  right breast cancer. She was last seen by NP Lacie on 07/26/2022. She presents to the clinic accompanied by son. Pt state that she is taking  OTC Iron. Pt finished Exemestane last month.Pt stated that she has some SOB.        All other systems were reviewed with the patient and are negative.  MEDICAL HISTORY:  Past Medical History:  Diagnosis Date   Allergic rhinitis due to animal (cat) (dog) hair and dander 04/27/2020   Allergic rhinitis due to pollen 04/27/2020   Anxiety    Anxiety and depression 01/20/2018   Arthritis    Asthma    Asthma, persistent controlled 10/05/2018   B12 deficiency 11/27/2019   Borderline diabetes 12/17/2015   Breast cancer (HCC)    Cancer (HCC) 2019   right breast cancer   CERVICAL CANCER, HX OF 12/06/2006   Qualifier: Diagnosis of  By: Lovell Sheehan MD, John E    Chronic sinusitis 01/26/2015   Chronic venous insufficiency 09/02/2015   Colon polyps    Congestive heart failure (HCC) 04/29/2020   Controlled type 2 diabetes mellitus without complication, without long-term current use of insulin (HCC) 05/28/2017   Coronary artery disease 25% of RCA, 20% intermediate branch, 40% diagonal branch based on cardiac cath from 2022 07/22/2020   Crohn disease (HCC) 11/29/2016   Depression    Diabetes mellitus without complication (HCC)    type 2   Dilated cardiomyopathy (HCC) ejection fraction 25% by echocardiogram from January 2022 04/29/2020   Dyspnea 09/15/2014   Elevated triglycerides with high cholesterol 11/26/2013   Environmental allergies    Eustachian tube dysfunction 12/04/2014   Ex-smoker 11/18/2015   Family history of breast cancer    Genetic testing 07/16/2017   Negative genetic testing on the multi-cancer panel.  The Multi-Gene Panel offered by [name] includes sequencing and/or deletion duplication testing of the following 83 genes: ALK, APC, ATM, AXIN2,BAP1,  BARD1, BLM, BMPR1A, BRCA1, BRCA2, BRIP1, CASR, CDC73, CDH1, CDK4, CDKN1B, CDKN1C, CDKN2A (p14ARF), CDKN2A (p16INK4a), CEBPA, CHEK2, CTNNA1, DICER1, DIS3L2, EGFR (c.2369C>T, p.Thr790Met variant onl   GERD (gastroesophageal reflux  disease)    HEADACHE 12/06/2006   Qualifier: Diagnosis of  By: Lovell Sheehan MD, John E    Hemothorax on left 10/02/2020   History of adenomatous polyp of colon 11/19/2017   Hyperlipidemia    Hypertension    Hypothyroidism    IDA (iron deficiency anemia) 11/06/2019   Impaired fasting glucose 12/12/2017   Insomnia 08/20/2013   Irritable bowel syndrome 11/29/2006   Qualifier: Diagnosis of  By: Lovell Sheehan MD, John E    Left sided abdominal pain 12/13/2017   Liver hemangioma 08/24/2014   Malignant neoplasm of upper-outer quadrant of right breast in female, 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   Migraines    Mild persistent asthma, uncomplicated 04/27/2020   Nasal polyp, unspecified 01/10/2018   Oral herpes 04/20/2013   Other allergic rhinitis 09/15/2014   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 10/02/2020   Personal history of radiation therapy 2019   36 radiation tx   PONV (postoperative nausea and vomiting)    scopolamine patch helps   Recurrent infections 10/05/2018   Right upper lobe pulmonary nodule 01/10/2018   Symptomatic varicose veins, right 09/02/2015   Ulcerative colitis (HCC)    Varicose veins     SURGICAL HISTORY: Past Surgical History:  Procedure Laterality Date   BREAST BIOPSY Right 02/2017   BREAST BIOPSY Right  11/07/2019   BREAST LUMPECTOMY Right 04/24/2017   BREAST LUMPECTOMY WITH RADIOACTIVE SEED AND SENTINEL LYMPH NODE BIOPSY Right 04/24/2017   Procedure: RIGHT BREAST LUMPECTOMY WITH RADIOACTIVE SEED AND SENTINEL LYMPH NODE BIOPSY;  Surgeon: Almond Lint, MD;  Location: [facility];  Service: General;  Laterality: Right;   CORONARY ARTERY BYPASS GRAFT     crapal tunnel relase Left 05/2018   ENDOMETRIAL ABLATION  2010   Had abnormal uterine bleeding (heavy and frequent), performed in [place], menses stopped completely 2 years after   EXPLORATORY LAPAROTOMY     Had exploratory surgery at the age of 81 for abdominal pain with cyst  found on her left ovary, not treated surgically   FOOT SURGERY Right 2012   GENERATOR REMOVAL  10/02/2020   Procedure: GENERATOR REMOVAL;  Surgeon: Purcell Nails, MD;  Location: [facility] OR;  Service: Thoracic;;   HAND SURGERY Right    ICD IMPLANT N/A 10/01/2020   Procedure: ICD IMPLANT;  Surgeon: Regan Lemming, MD;  Location: [facility];  Service: Cardiovascular;  Laterality: N/A;   ICD LEAD REMOVAL N/A 10/02/2020   Procedure: ICD LEAD REMOVAL;  Surgeon: Purcell Nails, MD;  Location: [facility];  Service: Thoracic;  Laterality: N/A;   MEDIASTERNOTOMY N/A 10/02/2020   Procedure: MEDIAN STERNOTOMY REPAIR PERFORATED VENTRICLE EVACUATION LEFT HEMOTHORAX;  Surgeon: Purcell Nails, MD;  Location: [facility] OR;  Service: Thoracic;  Laterality: N/A;   RE-EXCISION OF BREAST LUMPECTOMY Right 05/15/2017   Procedure: RE-EXCISION OF BREAST LUMPECTOMY;  Surgeon: Almond Lint, MD;  Location: [facility];  Service: General;  Laterality: Right;   RIGHT/LEFT HEART CATH AND CORONARY ANGIOGRAPHY N/A 06/21/2020   Procedure: RIGHT/LEFT HEART CATH AND CORONARY ANGIOGRAPHY;  Surgeon: Yvonne Kendall, MD;  Location: [facility];  Service: Cardiovascular;  Laterality: N/A;   ROBOTIC ASSISTED SALPINGO OOPHERECTOMY Bilateral 08/29/2019   Procedure: XI ROBOTIC ASSISTED SALPINGO OOPHORECTOMY;  Surgeon: [name], MD;  Location: [facility];  Service: Gynecology;  Laterality: Bilateral;   Septoplasty with turbinate reduction     TEE WITHOUT CARDIOVERSION N/A 10/02/2020   Procedure: TRANSESOPHAGEAL ECHOCARDIOGRAM (TEE);  Surgeon: Purcell Nails, MD;  Location: [facility] OR;  Service: Thoracic;  Laterality: N/A;   TONSILLECTOMY  1980   TOTAL HIP ARTHROPLASTY Right 12/20/2021   Procedure: RIGHT TOTAL HIP ARTHROPLASTY ANTERIOR APPROACH;  Surgeon: Sheral Apley, MD;  Location: [facility];  Service: Orthopedics;  Laterality: Right;   TOTAL HIP ARTHROPLASTY Left 05/23/2022   Procedure:  TOTAL HIP ARTHROPLASTY ANTERIOR APPROACH;  Surgeon: Sheral Apley, MD;  Location: [facility] OR;  Service: Orthopedics;  Laterality: Left;   VEIN SURGERY     Removal Right Leg   WISDOM TOOTH EXTRACTION      I have reviewed the social history and family history with the patient and they are unchanged from previous note.  ALLERGIES:  is allergic to losartan potassium, ace inhibitors, aspirin, ceclor [cefaclor], sulfa antibiotics, and lisinopril.  MEDICATIONS:  Current Outpatient Medications  Medication Sig Dispense Refill   acetaminophen (TYLENOL) 500 MG tablet Take 2 tablets (1,000 mg total) by mouth every 6 (six) hours as needed for moderate pain or mild pain. 30 tablet 0   acyclovir (ZOVIRAX) 400 MG tablet Take 1 tablet (400 mg total) by mouth 3 (three) times daily as needed. 30 tablet 1   Adalimumab 80 MG/0.8ML PNKT Inject 80 mg into the skin every 14 (fourteen) days.     albuterol (VENTOLIN HFA)  108 (90 Base) MCG/ACT inhaler Inhale 2 puffs into the lungs every 6 (six) hours as needed for shortness of breath.     aspirin EC 81 MG tablet Take 1 tablet (81 mg total) by mouth 2 (two) times daily. To prevent blood clots for 30 days after surgery. 60 tablet 0   Azelastine HCl 0.15 % SOLN Place 2 sprays into both nostrils 2 (two) times daily.     carvedilol (COREG) 12.5 MG tablet TAKE 1 TABLET (12.5MG  TOTAL) BY MOUTH TWICE A DAY WITH MEALS 180 tablet 0   cetirizine (ZYRTEC) 10 MG tablet Take 10 mg by mouth daily.     dapagliflozin propanediol (FARXIGA) 10 MG TABS tablet Take 1 tablet (10 mg total) by mouth daily before breakfast. 30 tablet 11   diphenoxylate-atropine (LOMOTIL) 2.5-0.025 MG tablet Take 2 tablets by mouth 4 (four) times daily as needed for diarrhea or loose stools.     docusate sodium (COLACE) 100 MG capsule Take 1 capsule (100 mg total) by mouth 2 (two) times daily as needed for mild constipation or moderate constipation. 20 capsule 0   EPINEPHrine 0.3 mg/0.3 mL IJ SOAJ injection  Inject 0.3 mg into the muscle as needed for anaphylaxis.     Hyoscyamine Sulfate 0.375 MG TBCR Take 0.375 mg by mouth 2 (two) times daily as needed (stomach cramping).     ipratropium (ATROVENT) 0.06 % nasal spray Place 2 sprays into both nostrils 4 (four) times daily. Use as needed 15 mL 12   lansoprazole (PREVACID SOLUTAB) 15 MG disintegrating tablet Take 15 mg by mouth 2 (two) times daily before a meal.     levothyroxine (SYNTHROID) 75 MCG tablet TAKE 1 TABLET BY MOUTH EVERY DAY BEFORE BREAKFAST 90 tablet 1   MAGNESIUM PO Take 250 mg by mouth daily.     Menthol, Topical Analgesic, (BIOFREEZE COLORLESS) 4 % GEL Apply 1 application topically daily as needed (Hip pain).     mesalamine (LIALDA) 1.2 g EC tablet Take 2.4 g by mouth in the morning and at bedtime.      metFORMIN (GLUCOPHAGE) 500 MG tablet TAKE 1 TABLET BY MOUTH TWICE A DAY WITH FOOD 180 tablet 3   methylPREDNISolone (MEDROL) 4 MG tablet Standard 6 day taper 21 tablet 0   montelukast (SINGULAIR) 10 MG tablet Take 10 mg by mouth at bedtime.      promethazine (PHENERGAN) 12.5 MG tablet Take 1 tablet (12.5 mg total) by mouth every 6 (six) hours as needed for nausea or vomiting. 30 tablet 0   rosuvastatin (CRESTOR) 20 MG tablet TAKE 1 TABLET BY MOUTH EVERY DAY 90 tablet 1   sacubitril-valsartan (ENTRESTO) 49-51 MG Take 1 tablet by mouth 2 (two) times daily. 60 tablet 11   spironolactone (ALDACTONE) 25 MG tablet Take 1 tablet (25 mg total) by mouth daily. 90 tablet 3   SYMBICORT 160-4.5 MCG/ACT inhaler INHALE 2 PUFFS INTO THE LUNGS TWICE A DAY 30.6 each 1   triamcinolone (NASACORT) 55 MCG/ACT AERO nasal inhaler Place 2 sprays into the nose daily.     venlafaxine XR (EFFEXOR-XR) 150 MG 24 hr capsule Take 1 capsule (150 mg total) by mouth daily with breakfast. 90 capsule 1   zinc gluconate 50 MG tablet Take 50 mg by mouth daily.     zolpidem (AMBIEN) 5 MG tablet TAKE 1 TABLET BY MOUTH EVERY DAY AT BEDTIME AS NEEDED FOR SLEEP STRENGTH: 5 MG 30  tablet 3   No current facility-administered medications for this visit.  Facility-Administered Medications Ordered in Other Visits  Medication Dose Route Frequency Provider Last Rate Last Admin   iron sucrose (VENOFER) 300 mg in sodium chloride 0.9 % 250 mL IVPB  300 mg Intravenous Once Malachy Mood, MD        PHYSICAL EXAMINATION: ECOG PERFORMANCE STATUS: 0 - Asymptomatic  Vitals:   10/27/22 0814  BP: 106/60  Pulse: 89  Resp: 18  Temp: 98 F (36.7 C)  SpO2: 98%   Wt Readings from Last 3 Encounters:  10/27/22 167 lb (75.8 kg)  10/04/22 161 lb (73 kg)  08/31/22 160 lb (72.6 kg)     GENERAL:alert, no distress and comfortable SKIN: skin color normal, no rashes or significant lesions EYES: normal, Conjunctiva are pink and non-injected, sclera clear  NEURO: alert & oriented x 3 with fluent speech NECK: (-) supple, thyroid normal size, non-tender, without nodularity LYMPH:(-)  no palpable lymphadenopathy in the cervical, axillary  ABDOMEN:(-) abdomen soft, non-tender and normal bowel sounds BREAST: RT breast nipple inverted, tender to the touch no palpable mass, LT breast no palpable mass breast exam benign.   LABORATORY DATA:  I have reviewed the data as listed    Latest Ref Rng & Units 09/20/2022   11:01 AM 08/31/2022   11:00 AM 07/26/2022   10:03 AM  CBC  WBC 4.0 - 10.5 K/uL 5.8  6.4  6.4   Hemoglobin 12.0 - 15.0 g/dL 09.8  11.9  14.7   Hematocrit 36.0 - 46.0 % 35.0  33.7  35.7   Platelets 150.0 - 400.0 K/uL 440.0  501.0  492         Latest Ref Rng & Units 08/31/2022   11:00 AM 07/26/2022   10:03 AM 06/21/2022   12:36 PM  CMP  Glucose 70 - 99 mg/dL 829  562  130   BUN 6 - 23 mg/dL 8  11  7    Creatinine 0.40 - 1.20 mg/dL 8.65  7.84  6.96   Sodium 135 - 145 mEq/L 141  134  134   Potassium 3.5 - 5.1 mEq/L 4.6  4.4  4.3   Chloride 96 - 112 mEq/L 103  102  101   CO2 19 - 32 mEq/L 26  23  21    Calcium 8.4 - 10.5 mg/dL 9.6  9.7  9.3   Total Protein 6.0 - 8.3 g/dL 6.6   6.9    Total Bilirubin 0.2 - 1.2 mg/dL 0.6  0.6    Alkaline Phos 39 - 117 U/L 77  91    AST 0 - 37 U/L 13  11    ALT 0 - 35 U/L 9  12        RADIOGRAPHIC STUDIES: I have personally reviewed the radiological images as listed and agreed with the findings in the report. No results found.    No orders of the defined types were placed in this encounter.  All questions were answered. The patient knows to call the clinic with any problems, questions or concerns. No barriers to learning was detected. The total time spent in the appointment was 25 minutes.     Malachy Mood, MD 10/27/2022   Carolin Coy, CMA, am acting as scribe for Malachy Mood, MD.   I have reviewed the above documentation for accuracy and completeness, and I agree with the above.

## 2022-10-30 ENCOUNTER — Encounter: Payer: Self-pay | Admitting: Family Medicine

## 2022-10-30 DIAGNOSIS — J0101 Acute recurrent maxillary sinusitis: Secondary | ICD-10-CM

## 2022-10-30 DIAGNOSIS — J3489 Other specified disorders of nose and nasal sinuses: Secondary | ICD-10-CM

## 2022-10-31 NOTE — Telephone Encounter (Signed)
From visit in June: "Consider ENT if symptoms persist or recur."  Referral pended. Sign if appropriate.

## 2022-11-03 ENCOUNTER — Ambulatory Visit
Admission: RE | Admit: 2022-11-03 | Discharge: 2022-11-03 | Disposition: A | Discharge status: Home / Self Care | Payer: No Typology Code available for payment source | Source: Ambulatory Visit | Attending: Urgent Care | Admitting: Urgent Care

## 2022-11-03 ENCOUNTER — Inpatient Hospital Stay: Payer: No Typology Code available for payment source

## 2022-11-03 ENCOUNTER — Other Ambulatory Visit: Payer: Self-pay

## 2022-11-03 VITALS — BP 102/65 | HR 90 | Temp 99.5°F | Resp 18 | Wt 163.0 lb

## 2022-11-03 VITALS — BP 106/70 | HR 94 | Temp 98.3°F | Resp 18

## 2022-11-03 DIAGNOSIS — R1032 Left lower quadrant pain: Secondary | ICD-10-CM | POA: Diagnosis not present

## 2022-11-03 DIAGNOSIS — D500 Iron deficiency anemia secondary to blood loss (chronic): Secondary | ICD-10-CM

## 2022-11-03 DIAGNOSIS — D509 Iron deficiency anemia, unspecified: Secondary | ICD-10-CM | POA: Diagnosis not present

## 2022-11-03 LAB — POCT URINALYSIS DIP (MANUAL ENTRY)
Bilirubin, UA: NEGATIVE
Blood, UA: NEGATIVE
Glucose, UA: >=1000 mg/dL — AB
Ketones, POC UA: NEGATIVE mg/dL
Nitrite, UA: NEGATIVE
Protein Ur, POC: NEGATIVE mg/dL
Spec Grav, UA: <=1.005 — AB (ref 1.010–1.025)
Urobilinogen, UA: 0.2 E.U./dL
pH, UA: 5.5 (ref 5.0–8.0)

## 2022-11-03 MED ORDER — AMOXICILLIN-POT CLAVULANATE 875-125 MG PO TABS: 1.0000 | ORAL_TABLET | Freq: Two times a day (BID) | ORAL | 0 refills | Status: AC

## 2022-11-03 MED ORDER — SODIUM CHLORIDE 0.9 % IV SOLN: Freq: Once | INTRAVENOUS | Status: AC

## 2022-11-03 MED ORDER — SODIUM CHLORIDE 0.9 % IV SOLN
300.0000 mg | Freq: Once | INTRAVENOUS | Status: AC
  Administered 2022-11-03: 300 mg via INTRAVENOUS
  Filled 2022-11-03: qty 300

## 2022-11-03 MED ORDER — CETIRIZINE HCL 10 MG/ML IV SOLN
10.0000 mg | Freq: Once | INTRAVENOUS | Status: AC
  Administered 2022-11-03: 10 mg via INTRAVENOUS

## 2022-11-03 NOTE — ED Triage Notes (Signed)
Pt reports left lower quadrant pain and lower back pain x 1 day. Pain improved after taking stool softener. Pt is concern as she has history of diverticulitis.

## 2022-11-03 NOTE — ED Provider Notes (Signed)
[facility]    CSN: [id number] Arrival date & time: 11/03/22  1850      History   Chief Complaint Chief Complaint  Patient presents with   Abdominal Pain    Entered by patient    HPI Erin Fields is a 57 y.o. female.   Pleasant 57 year old female presents today due to concerns of left lower quadrant pain.  She has a history of diverticulitis, and states her symptoms feel similar.  She states it has been several years since the previous episode.  She states her symptoms started yesterday and were much more severe yesterday, but are still bothering her today.  When sitting still, she is only in a 1-2 out of 10 pain.  With laying back or rapid movements, the pain increases.  She denies any diarrhea or blood in her stool.  She denies any nausea or vomiting.  She does not have a fever.  She does have a history of Crohn's disease, but states it is well-controlled with her Humira and does not have flareups.  She denies any urinary symptoms, does not have any vaginal discharge.  Additionally, states she has no ovaries.   Abdominal Pain   Past Medical History:  Diagnosis Date   Allergic rhinitis due to animal (cat) (dog) hair and dander 04/27/2020   Allergic rhinitis due to pollen 04/27/2020   Anxiety    Anxiety and depression 01/20/2018   Arthritis    Asthma    Asthma, persistent controlled 10/05/2018   B12 deficiency 11/27/2019   Borderline diabetes 12/17/2015   Breast cancer (HCC)    Cancer (HCC) 2019   right breast cancer   CERVICAL CANCER, HX OF 12/06/2006   Qualifier: Diagnosis of  By: Lovell Sheehan MD, John E    Chronic sinusitis 01/26/2015   Chronic venous insufficiency 09/02/2015   Colon polyps    Congestive heart failure (HCC) 04/29/2020   Controlled type 2 diabetes mellitus without complication, without long-term current use of insulin (HCC) 05/28/2017   Coronary artery disease 25% of RCA, 20% intermediate branch, 40% diagonal branch based on cardiac cath  from 2022 07/22/2020   Crohn disease (HCC) 11/29/2016   Depression    Diabetes mellitus without complication (HCC)    type 2   Dilated cardiomyopathy (HCC) ejection fraction 25% by echocardiogram from January 2022 04/29/2020   Dyspnea 09/15/2014   Elevated triglycerides with high cholesterol 11/26/2013   Environmental allergies    Eustachian tube dysfunction 12/04/2014   Ex-smoker 11/18/2015   Family history of breast cancer    Genetic testing 07/16/2017   Negative genetic testing on the multi-cancer panel.  The Multi-Gene Panel offered by [name] includes sequencing and/or deletion duplication testing of the following 83 genes: ALK, APC, ATM, AXIN2,BAP1,  BARD1, BLM, BMPR1A, BRCA1, BRCA2, BRIP1, CASR, CDC73, CDH1, CDK4, CDKN1B, CDKN1C, CDKN2A (p14ARF), CDKN2A (p16INK4a), CEBPA, CHEK2, CTNNA1, DICER1, DIS3L2, EGFR (c.2369C>T, p.Thr790Met variant onl   GERD (gastroesophageal reflux disease)    HEADACHE 12/06/2006   Qualifier: Diagnosis of  By: Lovell Sheehan MD, John E    Hemothorax on left 10/02/2020   History of adenomatous polyp of colon 11/19/2017   Hyperlipidemia    Hypertension    Hypothyroidism    IDA (iron deficiency anemia) 11/06/2019   Impaired fasting glucose 12/12/2017   Insomnia 08/20/2013   Irritable bowel syndrome 11/29/2006   Qualifier: Diagnosis of  By: Lovell Sheehan MD, John E    Left sided abdominal pain 12/13/2017   Liver hemangioma 08/24/2014   Malignant neoplasm  of upper-outer quadrant of right breast in female, 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   Migraines    Mild persistent asthma, uncomplicated 04/27/2020   Nasal polyp, unspecified 01/10/2018   Oral herpes 04/20/2013   Other allergic rhinitis 09/15/2014   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 10/02/2020   Personal history of radiation therapy 2019   36 radiation tx   PONV (postoperative nausea and vomiting)    scopolamine patch helps   Recurrent infections 10/05/2018   Right upper lobe  pulmonary nodule 01/10/2018   Symptomatic varicose veins, right 09/02/2015   Ulcerative colitis (HCC)    Varicose veins     Patient Active Problem List   Diagnosis Date Noted   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   DJD (degenerative joint disease) 12/20/2021   S/P total right hip arthroplasty 12/20/2021   Diabetes mellitus with insulin therapy (HCC) 10/24/2021   Lymphedema of breast 10/24/2021   Breast cancer (HCC) 05/11/2021   Hemothorax on left 10/02/2020   S/P evacuation of hematoma 10/02/2020   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 10/02/2020   Hypovolemic shock (HCC) 10/02/2020   Anxiety    Coronary artery disease 25% of RCA, 20% intermediate branch, 40% diagonal branch based on cardiac cath from 2022 07/22/2020   Dilated cardiomyopathy (HCC) ejection fraction 25% by echocardiogram from January 2022 04/29/2020   Congestive heart failure (HCC) 04/29/2020   Allergic rhinitis due to animal (cat) (dog) hair and dander 04/27/2020   Allergic rhinitis due to pollen 04/27/2020   Mild persistent asthma, uncomplicated 04/27/2020   PONV (postoperative nausea and vomiting)    Migraines    GERD (gastroesophageal reflux disease)    Environmental allergies    Diabetes mellitus without complication (HCC)    Depression    Colon polyps    B12 deficiency 11/27/2019   IDA (iron deficiency anemia) 11/06/2019   Asthma, persistent controlled 10/05/2018   Recurrent infections 10/05/2018   Anxiety and depression 01/20/2018   Right upper lobe pulmonary nodule 01/10/2018   Nasal polyp, unspecified 01/10/2018   Left sided abdominal pain 12/13/2017   Impaired fasting glucose 12/12/2017   History of adenomatous polyp of colon 11/19/2017   Genetic testing 07/16/2017   Family history of breast cancer    Controlled type 2 diabetes mellitus without complication, without long-term current use of insulin (HCC) 05/28/2017   Personal history of radiation therapy 2019   Cancer (HCC)  2019   Malignant neoplasm of upper-outer quadrant of right breast in female, 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   Crohn disease (HCC) 11/29/2016   Borderline diabetes 12/17/2015   Ex-smoker 11/18/2015   Chronic venous insufficiency 09/02/2015   Symptomatic varicose veins, right 09/02/2015   Chronic sinusitis 01/26/2015   Eustachian tube dysfunction 12/04/2014   Other allergic rhinitis 09/15/2014   Dyspnea 09/15/2014   Liver hemangioma 08/24/2014   Elevated triglycerides with high cholesterol 11/26/2013   Insomnia 08/20/2013   Hypertension 08/20/2013   Ulcerative colitis (HCC) 04/20/2013   Oral herpes 04/20/2013   Hyperlipidemia 12/06/2006   HEADACHE 12/06/2006   CERVICAL CANCER, HX OF 12/06/2006   Hypothyroidism 11/29/2006   Asthma 11/29/2006   IRRITABLE BOWEL SYNDROME 11/29/2006    Past Surgical History:  Procedure Laterality Date   BREAST BIOPSY Right 02/2017   BREAST BIOPSY Right 11/07/2019   BREAST LUMPECTOMY Right 04/24/2017   BREAST LUMPECTOMY WITH RADIOACTIVE SEED AND SENTINEL LYMPH NODE BIOPSY Right 04/24/2017   Procedure: RIGHT BREAST LUMPECTOMY WITH RADIOACTIVE SEED AND SENTINEL LYMPH NODE  BIOPSY;  Surgeon: Almond Lint, MD;  Location: [facility];  Service: General;  Laterality: Right;   CORONARY ARTERY BYPASS GRAFT     crapal tunnel relase Left 05/2018   ENDOMETRIAL ABLATION  2010   Had abnormal uterine bleeding (heavy and frequent), performed in [place], menses stopped completely 2 years after   EXPLORATORY LAPAROTOMY     Had exploratory surgery at the age of 89 for abdominal pain with cyst found on her left ovary, not treated surgically   FOOT SURGERY Right 2012   GENERATOR REMOVAL  10/02/2020   Procedure: GENERATOR REMOVAL;  Surgeon: Purcell Nails, MD;  Location: [facility] OR;  Service: Thoracic;;   HAND SURGERY Right    ICD IMPLANT N/A 10/01/2020   Procedure: ICD IMPLANT;  Surgeon: Regan Lemming, MD;  Location: [facility];  Service: Cardiovascular;  Laterality: N/A;   ICD LEAD REMOVAL N/A 10/02/2020   Procedure: ICD LEAD REMOVAL;  Surgeon: Purcell Nails, MD;  Location: [facility] OR;  Service: Thoracic;  Laterality: N/A;   MEDIASTERNOTOMY N/A 10/02/2020   Procedure: MEDIAN STERNOTOMY REPAIR PERFORATED VENTRICLE EVACUATION LEFT HEMOTHORAX;  Surgeon: Purcell Nails, MD;  Location: [facility];  Service: Thoracic;  Laterality: N/A;   RE-EXCISION OF BREAST LUMPECTOMY Right 05/15/2017   Procedure: RE-EXCISION OF BREAST LUMPECTOMY;  Surgeon: Almond Lint, MD;  Location: [facility];  Service: General;  Laterality: Right;   RIGHT/LEFT HEART CATH AND CORONARY ANGIOGRAPHY N/A 06/21/2020   Procedure: RIGHT/LEFT HEART CATH AND CORONARY ANGIOGRAPHY;  Surgeon: Yvonne Kendall, MD;  Location: [facility];  Service: Cardiovascular;  Laterality: N/A;   ROBOTIC ASSISTED SALPINGO OOPHERECTOMY Bilateral 08/29/2019   Procedure: XI ROBOTIC ASSISTED SALPINGO OOPHORECTOMY;  Surgeon: [name], MD;  Location: [facility];  Service: Gynecology;  Laterality: Bilateral;   Septoplasty with turbinate reduction     TEE WITHOUT CARDIOVERSION N/A 10/02/2020   Procedure: TRANSESOPHAGEAL ECHOCARDIOGRAM (TEE);  Surgeon: Purcell Nails, MD;  Location: [facility];  Service: Thoracic;  Laterality: N/A;   TONSILLECTOMY  1980   TOTAL HIP ARTHROPLASTY Right 12/20/2021   Procedure: RIGHT TOTAL HIP ARTHROPLASTY ANTERIOR APPROACH;  Surgeon: Sheral Apley, MD;  Location: [facility];  Service: Orthopedics;  Laterality: Right;   TOTAL HIP ARTHROPLASTY Left 05/23/2022   Procedure: TOTAL HIP ARTHROPLASTY ANTERIOR APPROACH;  Surgeon: Sheral Apley, MD;  Location: [facility];  Service: Orthopedics;  Laterality: Left;   VEIN SURGERY     Removal Right Leg   WISDOM TOOTH EXTRACTION      OB History     Gravida  3   Para  2   Term      Preterm      AB  1   Living  1      SAB      IAB      Ectopic      Multiple       Live Births               Home Medications    Prior to Admission medications   Medication Sig Start Date End Date Taking? Authorizing Provider  amoxicillin-clavulanate (AUGMENTIN) 875-125 MG tablet Take 1 tablet by mouth 2 (two) times daily with a meal for 7 days. 11/03/22 11/10/22 Yes Vencent Hauschild L, PA  acetaminophen (TYLENOL) 500 MG tablet Take 2 tablets (1,000 mg total) by mouth every 6 (six) hours as needed for moderate pain or mild pain. 12/21/21  Levester Fresh M, PA-C  acyclovir (ZOVIRAX) 400 MG tablet Take 1 tablet (400 mg total) by mouth 3 (three) times daily as needed. 07/26/22   Copland, Gwenlyn Found, MD  Adalimumab 80 MG/0.8ML PNKT Inject 80 mg into the skin every 14 (fourteen) days. 12/01/19   [provider]  albuterol (VENTOLIN HFA) 108 (90 Base) MCG/ACT inhaler Inhale 2 puffs into the lungs every 6 (six) hours as needed for shortness of breath. 09/22/20   [provider]  aspirin EC 81 MG tablet Take 1 tablet (81 mg total) by mouth 2 (two) times daily. To prevent blood clots for 30 days after surgery. 05/24/22   Jenne Pane, PA-C  Azelastine HCl 0.15 % SOLN Place 2 sprays into both nostrils 2 (two) times daily. 07/16/20   [provider]  carvedilol (COREG) 12.5 MG tablet TAKE 1 TABLET (12.5MG  TOTAL) BY MOUTH TWICE A DAY WITH MEALS 10/18/22   Laurey Morale, MD  cetirizine (ZYRTEC) 10 MG tablet Take 10 mg by mouth daily.    [provider]  dapagliflozin propanediol (FARXIGA) 10 MG TABS tablet Take 1 tablet (10 mg total) by mouth daily before breakfast. 12/27/21   Laurey Morale, MD  diphenoxylate-atropine (LOMOTIL) 2.5-0.025 MG tablet Take 2 tablets by mouth 4 (four) times daily as needed for diarrhea or loose stools.    [provider]  docusate sodium (COLACE) 100 MG capsule Take 1 capsule (100 mg total) by mouth 2 (two) times daily as needed for mild constipation or moderate constipation. 05/24/22 05/24/23  Jenne Pane, PA-C   EPINEPHrine 0.3 mg/0.3 mL IJ SOAJ injection Inject 0.3 mg into the muscle as needed for anaphylaxis.    [provider]  Hyoscyamine Sulfate 0.375 MG TBCR Take 0.375 mg by mouth 2 (two) times daily as needed (stomach cramping).    [provider]  ipratropium (ATROVENT) 0.06 % nasal spray Place 2 sprays into both nostrils 4 (four) times daily. Use as needed 06/29/21   Copland, Gwenlyn Found, MD  lansoprazole (PREVACID SOLUTAB) 15 MG disintegrating tablet Take 15 mg by mouth 2 (two) times daily before a meal.    [provider]  levothyroxine (SYNTHROID) 75 MCG tablet TAKE 1 TABLET BY MOUTH EVERY DAY BEFORE BREAKFAST 10/09/22   Copland, Gwenlyn Found, MD  MAGNESIUM PO Take 250 mg by mouth daily.    [provider]  Menthol, Topical Analgesic, (BIOFREEZE COLORLESS) 4 % GEL Apply 1 application topically daily as needed (Hip pain).    [provider]  mesalamine (LIALDA) 1.2 g EC tablet Take 2.4 g by mouth in the morning and at bedtime.     [provider]  metFORMIN (GLUCOPHAGE) 500 MG tablet TAKE 1 TABLET BY MOUTH TWICE A DAY WITH FOOD 10/23/22   Copland, Gwenlyn Found, MD  methylPREDNISolone (MEDROL) 4 MG tablet Standard 6 day taper 10/04/22   Clayborne Dana, NP  montelukast (SINGULAIR) 10 MG tablet Take 10 mg by mouth at bedtime.     [provider]  promethazine (PHENERGAN) 12.5 MG tablet Take 1 tablet (12.5 mg total) by mouth every 6 (six) hours as needed for nausea or vomiting. 05/24/22   Jenne Pane, PA-C  rosuvastatin (CRESTOR) 20 MG tablet TAKE 1 TABLET BY MOUTH EVERY DAY 08/18/22   Copland, Gwenlyn Found, MD  sacubitril-valsartan (ENTRESTO) 49-51 MG Take 1 tablet by mouth 2 (two) times daily. 06/21/22   Laurey Morale, MD  spironolactone (ALDACTONE) 25 MG tablet Take 1 tablet (  25 mg total) by mouth daily. 11/01/21   Laurey Morale, MD  SYMBICORT 160-4.5 MCG/ACT inhaler INHALE 2 PUFFS INTO THE LUNGS TWICE A DAY 02/20/22   Saguier, Ramon Dredge, PA-C   triamcinolone (NASACORT) 55 MCG/ACT AERO nasal inhaler Place 2 sprays into the nose daily.    [provider]  venlafaxine XR (EFFEXOR-XR) 150 MG 24 hr capsule Take 1 capsule (150 mg total) by mouth daily with breakfast. 10/25/22   Copland, Gwenlyn Found, MD  zinc gluconate 50 MG tablet Take 50 mg by mouth daily.    [provider]  zolpidem (AMBIEN) 5 MG tablet TAKE 1 TABLET BY MOUTH EVERY DAY AT BEDTIME AS NEEDED FOR SLEEP STRENGTH: 5 MG 08/22/22   Copland, Gwenlyn Found, MD    Family History Family History  Problem Relation Age of Onset   Arthritis Mother 13       Deceased   Breast cancer Mother    Lung cancer Mother    Hyperlipidemia Mother    Heart disease Mother    Hypertension Mother    Diabetes Mother    Crohn's disease Mother    Diabetes Maternal Grandfather    Heart disease Maternal Grandfather    Heart attack Maternal Grandfather    Leukemia Maternal Grandfather    Colitis Sister    Leukemia Cousin 3       mat cousin   Lung cancer Father    Brain cancer Father     Social History Social History   Tobacco Use   Smoking status: Former    Current packs/day: 0.00    Average packs/day: 3.0 packs/day for 15.0 years (45.0 ttl pk-yrs)    Types: Cigarettes    Start date: 09/15/1978    Quit date: 09/14/1993    Years since quitting: 29.1   Smokeless tobacco: Never   Tobacco comments:    Quit 26 yrs ago  Vaping Use   Vaping status: Never Used  Substance Use Topics   Alcohol use: Yes    Alcohol/week: 1.0 - 2.0 standard drink of alcohol    Types: 1 - 2 Glasses of wine per week    Comment: occ   Drug use: No     Allergies   Losartan potassium, Ace inhibitors, Aspirin, Ceclor [cefaclor], Sulfa antibiotics, and Lisinopril   Review of Systems Review of Systems  Gastrointestinal:  Positive for abdominal pain.  As per HPI   Physical Exam Triage Vital Signs ED Triage Vitals  Encounter Vitals Group     BP 11/03/22 1856 106/70     Systolic BP Percentile  --      Diastolic BP Percentile --      Pulse Rate 11/03/22 1856 94     Resp 11/03/22 1856 18     Temp 11/03/22 1856 98.3 F (36.8 C)     Temp Source 11/03/22 1856 Oral     SpO2 11/03/22 1856 94 %     Weight --      Height --      Head Circumference --      Peak Flow --      Pain Score 11/03/22 1900 2     Pain Loc --      Pain Education --      Exclude from Growth Chart --    No data found.  Updated Vital Signs BP 106/70 (BP Location: Right Arm)   Pulse 94   Temp 98.3 F (36.8 C) (Oral)   Resp 18   SpO2 94%  Visual Acuity Right Eye Distance:   Left Eye Distance:   Bilateral Distance:    Right Eye Near:   Left Eye Near:    Bilateral Near:     Physical Exam Vitals and nursing note reviewed.  Constitutional:      General: She is not in acute distress.    Appearance: She is well-developed. She is obese. She is not ill-appearing, toxic-appearing or diaphoretic.     Comments: NAD, sitting upright  HENT:     Head: Normocephalic and atraumatic.     Mouth/Throat:     Mouth: Mucous membranes are moist.     Pharynx: Oropharynx is clear. No pharyngeal swelling or oropharyngeal exudate.  Eyes:     General: No scleral icterus.    Extraocular Movements: Extraocular movements intact.     Pupils: Pupils are equal, round, and reactive to light.  Cardiovascular:     Rate and Rhythm: Normal rate and regular rhythm.  Pulmonary:     Effort: Pulmonary effort is normal. No respiratory distress.     Breath sounds: Normal breath sounds.  Abdominal:     General: Abdomen is flat. Bowel sounds are normal. There is no distension.     Palpations: Abdomen is soft. There is no hepatomegaly or splenomegaly.     Tenderness: There is abdominal tenderness (no signs of peritonitis or acute abdomen) in the left lower quadrant. There is rebound (minimal). There is no right CVA tenderness, left CVA tenderness or guarding.  Skin:    General: Skin is warm and dry.     Coloration: Skin is not  jaundiced or pale.     Findings: No rash.  Neurological:     General: No focal deficit present.     Mental Status: She is alert and oriented to person, place, and time.      UC Treatments / Results  Labs (all labs ordered are listed, but only abnormal results are displayed) Labs Reviewed  POCT URINALYSIS DIP (MANUAL ENTRY) - Abnormal; Notable for the following components:      Result Value   Glucose, UA >=1,000 (*)    Spec Grav, UA <=1.005 (*)    Leukocytes, UA Small (1+) (*)    All other components within normal limits    EKG   Radiology No results found.  Procedures Procedures (including critical care time)  Medications Ordered in UC Medications - No data to display  Initial Impression / Assessment and Plan / UC Course  I have reviewed the triage vital signs and the nursing notes.  Pertinent labs & imaging results that were available during my care of the patient were reviewed by me and considered in my medical decision making (see chart for details).     LLQ pain -given history of diverticulitis in the past and location of the stated pain, clinically suspect a recurrence of diverticulitis.  This appears to be mild.  Vital signs are stable, no red flag signs or symptoms.  I would recommend Augmentin twice daily for the next 7 days.  She does have an allergy to Ceclor, but states she tolerates penicillins fine.  We also discussed red flag signs or symptoms that would warrant ER visit   Final Clinical Impressions(s) / UC Diagnoses   Final diagnoses:  LLQ pain     Discharge Instructions      Your symptoms are most consistent with suspected diverticulitis. Please take the antibiotic prescribed today twice daily with food until gone. Please call your primary care physician  to schedule follow-up next week.  If you develop worsening pain, blood in your stool, or fever, please head to the emergency room.     ED Prescriptions     Medication Sig Dispense Auth.  Provider   amoxicillin-clavulanate (AUGMENTIN) 875-125 MG tablet Take 1 tablet by mouth 2 (two) times daily with a meal for 7 days. 14 tablet Hooper Petteway L, [place]      PDMP not reviewed this encounter.   Maretta Bees, [place] 11/03/22 2032

## 2022-11-03 NOTE — Patient Instructions (Signed)
Iron Sucrose Injection What is this medication? IRON SUCROSE (EYE ern SOO krose) treats low levels of iron (iron deficiency anemia) in people with kidney disease. Iron is a mineral that plays an important role in making red blood cells, which carry oxygen from your lungs to the rest of your body. This medicine may be used for other purposes; ask your health care provider or pharmacist if you have questions. COMMON BRAND NAME(S): Venofer What should I tell my care team before I take this medication? They need to know if you have any of these conditions: Anemia not caused by low iron levels Heart disease High levels of iron in the blood Kidney disease Liver disease An unusual or allergic reaction to iron, other medications, foods, dyes, or preservatives Pregnant or trying to get pregnant Breastfeeding How should I use this medication? This medication is for infusion into a vein. It is given in a hospital or clinic setting. Talk to your care team about the use of this medication in children. While this medication may be prescribed for children as young as 2 years for selected conditions, precautions do apply. Overdosage: If you think you have taken too much of this medicine contact a poison control center or emergency room at once. NOTE: This medicine is only for you. Do not share this medicine with others. What if I miss a dose? Keep appointments for follow-up doses. It is important not to miss your dose. Call your care team if you are unable to keep an appointment. What may interact with this medication? Do not take this medication with any of the following: Deferoxamine Dimercaprol Other iron products This medication may also interact with the following: Chloramphenicol Deferasirox This list may not describe all possible interactions. Give your health care provider a list of all the medicines, herbs, non-prescription drugs, or dietary supplements you use. Also tell them if you smoke,  drink alcohol, or use illegal drugs. Some items may interact with your medicine. What should I watch for while using this medication? Visit your care team regularly. Tell your care team if your symptoms do not start to get better or if they get worse. You may need blood work done while you are taking this medication. You may need to follow a special diet. Talk to your care team. Foods that contain iron include: whole grains/cereals, dried fruits, beans, or peas, leafy green vegetables, and organ meats (liver, kidney). What side effects may I notice from receiving this medication? Side effects that you should report to your care team as soon as possible: Allergic reactions--skin rash, itching, hives, swelling of the face, lips, tongue, or throat Low blood pressure--dizziness, feeling faint or lightheaded, blurry vision Shortness of breath Side effects that usually do not require medical attention (report to your care team if they continue or are bothersome): Flushing Headache Joint pain Muscle pain Nausea Pain, redness, or irritation at injection site This list may not describe all possible side effects. Call your doctor for medical advice about side effects. You may report side effects to FDA at 1-800-FDA-1088. Where should I keep my medication? This medication is given in a hospital or clinic. It will not be stored at home. NOTE: This sheet is a summary. It may not cover all possible information. If you have questions about this medicine, talk to your doctor, pharmacist, or health care provider.  2024 Elsevier/Gold Standard (2022-09-08 00:00:00)

## 2022-11-03 NOTE — Discharge Instructions (Addendum)
Your symptoms are most consistent with suspected diverticulitis. Please take the antibiotic prescribed today twice daily with food until gone. Please call your primary care physician to schedule follow-up next week.  If you develop worsening pain, blood in your stool, or fever, please head to the emergency room.

## 2022-11-04 ENCOUNTER — Encounter: Payer: Self-pay | Admitting: Family

## 2022-11-09 ENCOUNTER — Other Ambulatory Visit: Payer: Self-pay

## 2022-11-09 DIAGNOSIS — Z170 Estrogen receptor positive status [ER+]: Secondary | ICD-10-CM

## 2022-11-10 ENCOUNTER — Inpatient Hospital Stay: Payer: No Typology Code available for payment source

## 2022-11-10 VITALS — BP 115/65 | HR 76 | Resp 16

## 2022-11-10 DIAGNOSIS — D500 Iron deficiency anemia secondary to blood loss (chronic): Secondary | ICD-10-CM

## 2022-11-10 DIAGNOSIS — D509 Iron deficiency anemia, unspecified: Secondary | ICD-10-CM | POA: Diagnosis not present

## 2022-11-10 DIAGNOSIS — Z170 Estrogen receptor positive status [ER+]: Secondary | ICD-10-CM

## 2022-11-10 LAB — CBC WITH DIFFERENTIAL (CANCER CENTER ONLY)
Abs Immature Granulocytes: 0.13 10*3/uL — ABNORMAL HIGH (ref 0.00–0.07)
Basophils Absolute: 0.1 10*3/uL (ref 0.0–0.1)
Basophils Relative: 1 %
Eosinophils Absolute: 0.2 10*3/uL (ref 0.0–0.5)
Eosinophils Relative: 2 %
HCT: 38.7 % (ref 36.0–46.0)
Hemoglobin: 12 g/dL (ref 12.0–15.0)
Immature Granulocytes: 2 %
Lymphocytes Relative: 35 %
Lymphs Abs: 3 10*3/uL (ref 0.7–4.0)
MCH: 25.8 pg — ABNORMAL LOW (ref 26.0–34.0)
MCHC: 31 g/dL (ref 30.0–36.0)
MCV: 83 fL (ref 80.0–100.0)
Monocytes Absolute: 1.1 10*3/uL — ABNORMAL HIGH (ref 0.1–1.0)
Monocytes Relative: 13 %
Neutro Abs: 4 10*3/uL (ref 1.7–7.7)
Neutrophils Relative %: 47 %
Platelet Count: 479 10*3/uL — ABNORMAL HIGH (ref 150–400)
RBC: 4.66 MIL/uL (ref 3.87–5.11)
RDW: 20.1 % — ABNORMAL HIGH (ref 11.5–15.5)
WBC Count: 8.6 10*3/uL (ref 4.0–10.5)
nRBC: 0 % (ref 0.0–0.2)

## 2022-11-10 LAB — CMP (CANCER CENTER ONLY)
ALT: 15 U/L (ref 0–44)
AST: 19 U/L (ref 15–41)
Albumin: 4.4 g/dL (ref 3.5–5.0)
Alkaline Phosphatase: 61 U/L (ref 38–126)
Anion gap: 8 (ref 5–15)
BUN: 6 mg/dL (ref 6–20)
CO2: 26 mmol/L (ref 22–32)
Calcium: 9.6 mg/dL (ref 8.9–10.3)
Chloride: 104 mmol/L (ref 98–111)
Creatinine: 0.76 mg/dL (ref 0.44–1.00)
GFR, Estimated: >60 mL/min (ref >60)
Glucose, Bld: 93 mg/dL (ref 70–99)
Potassium: 4.5 mmol/L (ref 3.5–5.1)
Sodium: 138 mmol/L (ref 135–145)
Total Bilirubin: 0.5 mg/dL (ref 0.3–1.2)
Total Protein: 6.5 g/dL (ref 6.5–8.1)

## 2022-11-10 MED ORDER — CETIRIZINE HCL 10 MG/ML IV SOLN
10.0000 mg | Freq: Once | INTRAVENOUS | Status: AC
  Administered 2022-11-10: 10 mg via INTRAVENOUS
  Filled 2022-11-10: qty 1

## 2022-11-10 MED ORDER — SODIUM CHLORIDE 0.9 % IV SOLN: Freq: Once | INTRAVENOUS | Status: AC

## 2022-11-10 MED ORDER — SODIUM CHLORIDE 0.9 % IV SOLN
300.0000 mg | Freq: Once | INTRAVENOUS | Status: AC
  Administered 2022-11-10: 300 mg via INTRAVENOUS
  Filled 2022-11-10: qty 300

## 2022-11-10 NOTE — Patient Instructions (Signed)
Iron Sucrose Injection What is this medication? IRON SUCROSE (EYE ern SOO krose) treats low levels of iron (iron deficiency anemia) in people with kidney disease. Iron is a mineral that plays an important role in making red blood cells, which carry oxygen from your lungs to the rest of your body. This medicine may be used for other purposes; ask your health care provider or pharmacist if you have questions. COMMON BRAND NAME(S): Venofer What should I tell my care team before I take this medication? They need to know if you have any of these conditions: Anemia not caused by low iron levels Heart disease High levels of iron in the blood Kidney disease Liver disease An unusual or allergic reaction to iron, other medications, foods, dyes, or preservatives Pregnant or trying to get pregnant Breastfeeding How should I use this medication? This medication is for infusion into a vein. It is given in a hospital or clinic setting. Talk to your care team about the use of this medication in children. While this medication may be prescribed for children as young as 2 years for selected conditions, precautions do apply. Overdosage: If you think you have taken too much of this medicine contact a poison control center or emergency room at once. NOTE: This medicine is only for you. Do not share this medicine with others. What if I miss a dose? Keep appointments for follow-up doses. It is important not to miss your dose. Call your care team if you are unable to keep an appointment. What may interact with this medication? Do not take this medication with any of the following: Deferoxamine Dimercaprol Other iron products This medication may also interact with the following: Chloramphenicol Deferasirox This list may not describe all possible interactions. Give your health care provider a list of all the medicines, herbs, non-prescription drugs, or dietary supplements you use. Also tell them if you smoke,  drink alcohol, or use illegal drugs. Some items may interact with your medicine. What should I watch for while using this medication? Visit your care team regularly. Tell your care team if your symptoms do not start to get better or if they get worse. You may need blood work done while you are taking this medication. You may need to follow a special diet. Talk to your care team. Foods that contain iron include: whole grains/cereals, dried fruits, beans, or peas, leafy green vegetables, and organ meats (liver, kidney). What side effects may I notice from receiving this medication? Side effects that you should report to your care team as soon as possible: Allergic reactions--skin rash, itching, hives, swelling of the face, lips, tongue, or throat Low blood pressure--dizziness, feeling faint or lightheaded, blurry vision Shortness of breath Side effects that usually do not require medical attention (report to your care team if they continue or are bothersome): Flushing Headache Joint pain Muscle pain Nausea Pain, redness, or irritation at injection site This list may not describe all possible side effects. Call your doctor for medical advice about side effects. You may report side effects to FDA at 1-800-FDA-1088. Where should I keep my medication? This medication is given in a hospital or clinic. It will not be stored at home. NOTE: This sheet is a summary. It may not cover all possible information. If you have questions about this medicine, talk to your doctor, pharmacist, or health care provider.  2024 Elsevier/Gold Standard (2022-09-08 00:00:00)

## 2022-11-10 NOTE — Progress Notes (Signed)
Pt declined to stay for full 30 min post observation period. No c/o adverse s/s and no hx adverse s/s. VSS at time of discharge. Pt ambulated independently to lobby for d.c accompanied by family. AVS declined.

## 2022-11-13 ENCOUNTER — Encounter: Payer: Self-pay | Admitting: Hematology

## 2022-11-15 ENCOUNTER — Other Ambulatory Visit: Payer: Self-pay

## 2022-11-16 ENCOUNTER — Telehealth (HOSPITAL_COMMUNITY): Payer: Self-pay

## 2022-11-16 ENCOUNTER — Other Ambulatory Visit (HOSPITAL_COMMUNITY): Payer: Self-pay

## 2022-11-16 ENCOUNTER — Encounter: Payer: Self-pay | Admitting: Family

## 2022-11-16 NOTE — Telephone Encounter (Signed)
Patient called and left message on voicemail stating that she needs entresto prior auth. Returned call to patient and made her aware I would forward message over to pharm department to see if this can be done. Patient aware and verbalized understanding.    New insurance information   [employer]   [employer] Group- [id number] Subscriber ID- [id number] Group #- [id number]

## 2022-11-17 ENCOUNTER — Other Ambulatory Visit (HOSPITAL_COMMUNITY): Payer: Self-pay

## 2022-11-20 ENCOUNTER — Encounter: Payer: Self-pay | Admitting: Family

## 2022-11-20 ENCOUNTER — Other Ambulatory Visit (HOSPITAL_COMMUNITY): Payer: Self-pay

## 2022-11-21 NOTE — Telephone Encounter (Signed)
Advanced Heart Failure Patient Advocate Encounter  Attempted to submit PA on CMM, insurance states PA is resolved. [employer], they state that a PA not needed.  Archer Asa, CPhT

## 2022-11-28 ENCOUNTER — Other Ambulatory Visit (HOSPITAL_COMMUNITY): Payer: Self-pay | Admitting: Cardiology

## 2022-11-28 DIAGNOSIS — I5022 Chronic systolic (congestive) heart failure: Secondary | ICD-10-CM

## 2022-12-01 ENCOUNTER — Ambulatory Visit: Payer: No Typology Code available for payment source | Attending: Cardiology | Admitting: Cardiology

## 2022-12-01 ENCOUNTER — Encounter: Payer: Self-pay | Admitting: Cardiology

## 2022-12-01 VITALS — BP 124/70 | HR 84 | Ht 63.5 in | Wt 163.0 lb

## 2022-12-01 DIAGNOSIS — Z96642 Presence of left artificial hip joint: Secondary | ICD-10-CM

## 2022-12-01 DIAGNOSIS — I9789 Other postprocedural complications and disorders of the circulatory system, not elsewhere classified: Secondary | ICD-10-CM

## 2022-12-01 DIAGNOSIS — Z96641 Presence of right artificial hip joint: Secondary | ICD-10-CM

## 2022-12-01 DIAGNOSIS — S2699XS Other injury of heart, unspecified with or without hemopericardium, sequela: Secondary | ICD-10-CM

## 2022-12-01 DIAGNOSIS — I420 Dilated cardiomyopathy: Secondary | ICD-10-CM | POA: Diagnosis not present

## 2022-12-01 DIAGNOSIS — I2510 Atherosclerotic heart disease of native coronary artery without angina pectoris: Secondary | ICD-10-CM | POA: Diagnosis not present

## 2022-12-01 NOTE — Progress Notes (Signed)
Cardiology Office Note:    Date:  12/01/2022   ID:  Erin Fields, DOB 1965/09/10, MRN [id number]  PCP:  Erin Cables, MD  Cardiologist:  Erin Balsam, MD    Referring MD: Erin Cables, MD   Chief Complaint  Patient presents with   Follow-up    History of Present Illness:    Erin Fields is a 57 y.o. female  with complex past medical history.  She was initially referred to [place] because of EKG being abnormal that was done before elective hip replacement surgery after that echocardiogram has been performed and surprising she was found to have 25% ejection fraction which is severely diminished, that was followed by cardiac catheterization which showed nonobstructive disease she was put on appropriate medication in spite of that her ejection fraction was still diminished, she was referred to our EP team for consideration for ICD.  ICD was implanted and everything was fine she left home however while at home she started having chest pain.  She ended going to the emergency room thinking that she may have discharged from the defibrillator, device has been interrogated no discharge has being identified she was discharged home, however presented back within the next few hours with shortness of breath.  She suffered from perforation of the right ventricle with injury of the artery leading to blood collection in the pleura.  She required open heart surgery with repair of the perforation of the right ventricle as well as evacuation of hematoma from the left pleura.   Since that time she is doing fine.  She did have bilateral hip surgery done doing very well very happy with the surgery.  Last echocardiogram showed for serve normal left ventricle ejection fraction.  Comes today to my office doing well she is scheduled to have sinus surgery which will be done in November.  Denies have any chest pain tightness squeezing pressure burning chest, she was walking on the regular basis but lately  slowed down because of poor weather being very hot.  Past Medical History:  Diagnosis Date   Allergic rhinitis due to animal (cat) (dog) hair and dander 04/27/2020   Allergic rhinitis due to pollen 04/27/2020   Anxiety    Anxiety and depression 01/20/2018   Arthritis    Asthma    Asthma, persistent controlled 10/05/2018   B12 deficiency 11/27/2019   Borderline diabetes 12/17/2015   Breast cancer (HCC)    Cancer (HCC) 2019   right breast cancer   CERVICAL CANCER, HX OF 12/06/2006   Qualifier: Diagnosis of  By: Lovell Sheehan MD, John E    Chronic sinusitis 01/26/2015   Chronic venous insufficiency 09/02/2015   Colon polyps    Congestive heart failure (HCC) 04/29/2020   Controlled type 2 diabetes mellitus without complication, without long-term current use of insulin (HCC) 05/28/2017   Coronary artery disease 25% of RCA, 20% intermediate branch, 40% diagonal branch based on cardiac cath from 2022 07/22/2020   Crohn disease (HCC) 11/29/2016   Depression    Diabetes mellitus without complication (HCC)    type 2   Dilated cardiomyopathy (HCC) ejection fraction 25% by echocardiogram from January 2022 04/29/2020   Dyspnea 09/15/2014   Elevated triglycerides with high cholesterol 11/26/2013   Environmental allergies    Eustachian tube dysfunction 12/04/2014   Ex-smoker 11/18/2015   Family history of breast cancer    Genetic testing 07/16/2017   Negative genetic testing on the multi-cancer panel.  The Multi-Gene Panel offered by [name] includes  sequencing and/or deletion duplication testing of the following 83 genes: ALK, APC, ATM, AXIN2,BAP1,  BARD1, BLM, BMPR1A, BRCA1, BRCA2, BRIP1, CASR, CDC73, CDH1, CDK4, CDKN1B, CDKN1C, CDKN2A (p14ARF), CDKN2A (p16INK4a), CEBPA, CHEK2, CTNNA1, DICER1, DIS3L2, EGFR (c.2369C>T, p.Thr790Met variant onl   GERD (gastroesophageal reflux disease)    HEADACHE 12/06/2006   Qualifier: Diagnosis of  By: Lovell Sheehan MD, John E    Hemothorax on left 10/02/2020    History of adenomatous polyp of colon 11/19/2017   Hyperlipidemia    Hypertension    Hypothyroidism    IDA (iron deficiency anemia) 11/06/2019   Impaired fasting glucose 12/12/2017   Insomnia 08/20/2013   Irritable bowel syndrome 11/29/2006   Qualifier: Diagnosis of  By: Lovell Sheehan MD, John E    Left sided abdominal pain 12/13/2017   Liver hemangioma 08/24/2014   Malignant neoplasm of upper-outer quadrant of right breast in female, 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   Migraines    Mild persistent asthma, uncomplicated 04/27/2020   Nasal polyp, unspecified 01/10/2018   Oral herpes 04/20/2013   Other allergic rhinitis 09/15/2014   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 10/02/2020   Personal history of radiation therapy 2019   36 radiation tx   PONV (postoperative nausea and vomiting)    scopolamine patch helps   Recurrent infections 10/05/2018   Right upper lobe pulmonary nodule 01/10/2018   Symptomatic varicose veins, right 09/02/2015   Ulcerative colitis (HCC)    Varicose veins     Past Surgical History:  Procedure Laterality Date   BREAST BIOPSY Right 02/2017   BREAST BIOPSY Right 11/07/2019   BREAST LUMPECTOMY Right 04/24/2017   BREAST LUMPECTOMY WITH RADIOACTIVE SEED AND SENTINEL LYMPH NODE BIOPSY Right 04/24/2017   Procedure: RIGHT BREAST LUMPECTOMY WITH RADIOACTIVE SEED AND SENTINEL LYMPH NODE BIOPSY;  Surgeon: Almond Lint, MD;  Location: [facility];  Service: General;  Laterality: Right;   CORONARY ARTERY BYPASS GRAFT     crapal tunnel relase Left 05/2018   ENDOMETRIAL ABLATION  2010   Had abnormal uterine bleeding (heavy and frequent), performed in [place], menses stopped completely 2 years after   EXPLORATORY LAPAROTOMY     Had exploratory surgery at the age of 58 for abdominal pain with cyst found on her left ovary, not treated surgically   FOOT SURGERY Right 2012   GENERATOR REMOVAL  10/02/2020   Procedure: GENERATOR REMOVAL;   Surgeon: Purcell Nails, MD;  Location: [facility] OR;  Service: Thoracic;;   HAND SURGERY Right    ICD IMPLANT N/A 10/01/2020   Procedure: ICD IMPLANT;  Surgeon: Regan Lemming, MD;  Location: [facility];  Service: Cardiovascular;  Laterality: N/A;   ICD LEAD REMOVAL N/A 10/02/2020   Procedure: ICD LEAD REMOVAL;  Surgeon: Purcell Nails, MD;  Location: [facility] OR;  Service: Thoracic;  Laterality: N/A;   MEDIASTERNOTOMY N/A 10/02/2020   Procedure: MEDIAN STERNOTOMY REPAIR PERFORATED VENTRICLE EVACUATION LEFT HEMOTHORAX;  Surgeon: Purcell Nails, MD;  Location: [facility] OR;  Service: Thoracic;  Laterality: N/A;   RE-EXCISION OF BREAST LUMPECTOMY Right 05/15/2017   Procedure: RE-EXCISION OF BREAST LUMPECTOMY;  Surgeon: Almond Lint, MD;  Location: [facility];  Service: General;  Laterality: Right;   RIGHT/LEFT HEART CATH AND CORONARY ANGIOGRAPHY N/A 06/21/2020   Procedure: RIGHT/LEFT HEART CATH AND CORONARY ANGIOGRAPHY;  Surgeon: Yvonne Kendall, MD;  [facility]: [facility];  Service: Cardiovascular;  Laterality: N/A;   ROBOTIC ASSISTED SALPINGO OOPHERECTOMY Bilateral 08/29/2019   Procedure: XI ROBOTIC  ASSISTED SALPINGO OOPHORECTOMY;  Surgeon: Erin Fila, MD;  Location: [facility];  Service: Gynecology;  Laterality: Bilateral;   Septoplasty with turbinate reduction     TEE WITHOUT CARDIOVERSION N/A 10/02/2020   Procedure: TRANSESOPHAGEAL ECHOCARDIOGRAM (TEE);  Surgeon: Purcell Nails, MD;  [facility];  Service: Thoracic;  Laterality: N/A;   TONSILLECTOMY  1980   TOTAL HIP ARTHROPLASTY Right 12/20/2021   Procedure: RIGHT TOTAL HIP ARTHROPLASTY ANTERIOR APPROACH;  Surgeon: Sheral Apley, MD;  Location: [facility];  Service: Orthopedics;  Laterality: Right;   TOTAL HIP ARTHROPLASTY Left 05/23/2022   Procedure: TOTAL HIP ARTHROPLASTY ANTERIOR APPROACH;  Surgeon: Sheral Apley, MD;  Location: [facility];  Service: Orthopedics;  Laterality: Left;   VEIN  SURGERY     Removal Right Leg   WISDOM TOOTH EXTRACTION      Current Medications: Current Meds  Medication Sig   acetaminophen (TYLENOL) 500 MG tablet Take 2 tablets (1,000 mg total) by mouth every 6 (six) hours as needed for moderate pain or mild pain.   acyclovir (ZOVIRAX) 400 MG tablet Take 1 tablet (400 mg total) by mouth 3 (three) times daily as needed. (Patient taking differently: Take 400 mg by mouth 3 (three) times daily as needed (out breaks).)   Adalimumab 80 MG/0.8ML PNKT Inject 80 mg into the skin every 14 (fourteen) days.   albuterol (VENTOLIN HFA) 108 (90 Base) MCG/ACT inhaler Inhale 2 puffs into the lungs every 6 (six) hours as needed for shortness of breath.   aspirin EC 81 MG tablet Take 1 tablet (81 mg total) by mouth 2 (two) times daily. To prevent blood clots for 30 days after surgery.   Azelastine HCl 0.15 % SOLN Place 2 sprays into both nostrils 2 (two) times daily.   carvedilol (COREG) 12.5 MG tablet TAKE 1 TABLET (12.5MG  TOTAL) BY MOUTH TWICE A DAY WITH MEALS (Patient taking differently: Take 12.5 mg by mouth 2 (two) times daily with a meal.)   cetirizine (ZYRTEC) 10 MG tablet Take 10 mg by mouth daily.   dapagliflozin propanediol (FARXIGA) 10 MG TABS tablet Take 1 tablet (10 mg total) by mouth daily before breakfast.   diphenoxylate-atropine (LOMOTIL) 2.5-0.025 MG tablet Take 2 tablets by mouth 4 (four) times daily as needed for diarrhea or loose stools.   docusate sodium (COLACE) 100 MG capsule Take 1 capsule (100 mg total) by mouth 2 (two) times daily as needed for mild constipation or moderate constipation.   EPINEPHrine 0.3 mg/0.3 mL IJ SOAJ injection Inject 0.3 mg into the muscle as needed for anaphylaxis.   Hyoscyamine Sulfate 0.375 MG TBCR Take 0.375 mg by mouth 2 (two) times daily as needed (stomach cramping).   ipratropium (ATROVENT) 0.06 % nasal spray Place 2 sprays into both nostrils 4 (four) times daily. Use as needed   lansoprazole (PREVACID SOLUTAB) 15 MG  disintegrating tablet Take 15 mg by mouth 2 (two) times daily before a meal.   levothyroxine (SYNTHROID) 75 MCG tablet TAKE 1 TABLET BY MOUTH EVERY DAY BEFORE BREAKFAST (Patient taking differently: Take 75 mcg by mouth daily before breakfast.)   MAGNESIUM PO Take 250 mg by mouth daily.   Menthol, Topical Analgesic, (BIOFREEZE COLORLESS) 4 % GEL Apply 1 application topically daily as needed (Hip pain).   mesalamine (LIALDA) 1.2 g EC tablet Take 2.4 g by mouth in the morning and at bedtime.    metFORMIN (GLUCOPHAGE) 500 MG tablet TAKE 1 TABLET BY MOUTH TWICE A DAY WITH FOOD  methylPREDNISolone (MEDROL) 4 MG tablet Standard 6 day taper (Patient taking differently: Take 4 mg by mouth as directed. Standard 6 day taper)   montelukast (SINGULAIR) 10 MG tablet Take 10 mg by mouth at bedtime.    promethazine (PHENERGAN) 12.5 MG tablet Take 1 tablet (12.5 mg total) by mouth every 6 (six) hours as needed for nausea or vomiting.   rosuvastatin (CRESTOR) 20 MG tablet TAKE 1 TABLET BY MOUTH EVERY DAY   sacubitril-valsartan (ENTRESTO) 49-51 MG Take 1 tablet by mouth 2 (two) times daily.   spironolactone (ALDACTONE) 25 MG tablet Take 1 tablet (25 mg total) by mouth daily.   SYMBICORT 160-4.5 MCG/ACT inhaler INHALE 2 PUFFS INTO THE LUNGS TWICE A DAY (Patient taking differently: Inhale 2 puffs into the lungs 2 (two) times daily.)   triamcinolone (NASACORT) 55 MCG/ACT AERO nasal inhaler Place 2 sprays into the nose daily.   venlafaxine XR (EFFEXOR-XR) 150 MG 24 hr capsule Take 1 capsule (150 mg total) by mouth daily with breakfast.   zinc gluconate 50 MG tablet Take 50 mg by mouth daily.   zolpidem (AMBIEN) 5 MG tablet TAKE 1 TABLET BY MOUTH EVERY DAY AT BEDTIME AS NEEDED FOR SLEEP STRENGTH: 5 MG (Patient taking differently: Take 5 mg by mouth at bedtime as needed for sleep. TAKE 1 TABLET BY MOUTH EVERY DAY AT BEDTIME AS NEEDED FOR SLEEP Strength: 5 mg)     Allergies:   Losartan potassium, Ace inhibitors, Aspirin,  Ceclor [cefaclor], Sulfa antibiotics, and Lisinopril   Social History   Socioeconomic History   Marital status: Widowed    Spouse name: Not on file   Number of children: Not on file   Years of education: Not on file   Highest education level: GED or equivalent  Occupational History   Not on file  Tobacco Use   Smoking status: Former    Current packs/day: 0.00    Average packs/day: 3.0 packs/day for 15.0 years (45.0 ttl pk-yrs)    Types: Cigarettes    Start date: 09/15/1978    Quit date: 09/14/1993    Years since quitting: 29.2   Smokeless tobacco: Never   Tobacco comments:    Quit 26 yrs ago  Vaping Use   Vaping status: Never Used  Substance and Sexual Activity   Alcohol use: Yes    Alcohol/week: 1.0 - 2.0 standard drink of alcohol    Types: 1 - 2 Glasses of wine per week    Comment: occ   Drug use: No   Sexual activity: Not Currently    Birth control/protection: Post-menopausal    Comment: ablation  Other Topics Concern   Not on file  Social History Narrative   Not on file   Social Determinants of Health   Financial Resource Strain: Low Risk  (07/30/2022)   Overall Financial Resource Strain (CARDIA)    Difficulty of Paying Living Expenses: Not hard at all  Food Insecurity: No Food Insecurity (07/30/2022)   Hunger Vital Sign    Worried About Running Out of Food in the Last Year: Never true    Ran Out of Food in the Last Year: Never true  Transportation Needs: No Transportation Needs (07/30/2022)   PRAPARE - Administrator, Civil Service (Medical): No    Lack of Transportation (Non-Medical): No  Physical Activity: Insufficiently Active (07/30/2022)   Exercise Vital Sign    Days of Exercise per Week: 2 days    Minutes of Exercise per Session: 20 min  Stress:  No Stress Concern Present (07/30/2022)   [employer] of Occupational Health - Occupational Stress Questionnaire    Feeling of Stress : Only a little  Social Connections: Socially Isolated  (07/30/2022)   Social Connection and Isolation Panel [NHANES]    Frequency of Communication with Friends and Family: More than three times a week    Frequency of Social Gatherings with Friends and Family: More than three times a week    Attends Religious Services: Never    Database administrator or Organizations: No    Attends Banker Meetings: Not on file    Marital Status: Widowed     Family History: The patient's family history includes Arthritis (age of onset: 41) in her mother; Brain cancer in her father; Breast cancer in her mother; Colitis in her sister; Crohn's disease in her mother; Diabetes in her maternal grandfather and mother; Heart attack in her maternal grandfather; Heart disease in her maternal grandfather and mother; Hyperlipidemia in her mother; Hypertension in her mother; Leukemia in her maternal grandfather; Leukemia (age of onset: 3) in her cousin; Lung cancer in her father and mother. ROS:   Please see the history of present illness.    All 14 point review of systems negative except as described per history of present illness  EKGs/Labs/Other Studies Reviewed:         Recent Labs: 03/06/2022: TSH 0.63 06/21/2022: B Natriuretic Peptide 42.4 11/10/2022: ALT 15; BUN 6; Creatinine 0.76; Hemoglobin 12.0; Platelet Count 479; Potassium 4.5; Sodium 138  Recent Lipid Panel    Component Value Date/Time   CHOL 132 09/14/2021 0955   CHOL 128 08/10/2020 0936   TRIG 202.0 (H) 09/14/2021 0955   HDL 40.10 09/14/2021 0955   HDL 37 (L) 08/10/2020 0936   CHOLHDL 3 09/14/2021 0955   VLDL 40.4 (H) 09/14/2021 0955   LDLCALC 65 08/10/2020 0936   LDLDIRECT 66.0 09/14/2021 0955    Physical Exam:    VS:  BP 124/70 (BP Location: Left Arm, Patient Position: Sitting)   Pulse 84   Ht 5' 3.5" (1.613 m)   Wt 163 lb (73.9 kg)   SpO2 93%   BMI 28.42 kg/m     Wt Readings from Last 3 Encounters:  12/01/22 163 lb (73.9 kg)  11/03/22 163 lb (73.9 kg)  10/27/22 167 lb  (75.8 kg)     GEN:  Well nourished, well developed in no acute distress HEENT: Normal NECK: No JVD; No carotid bruits LYMPHATICS: No lymphadenopathy CARDIAC: RRR, no murmurs, no rubs, no gallops RESPIRATORY:  Clear to auscultation without rales, wheezing or rhonchi  ABDOMEN: Soft, non-tender, non-distended MUSCULOSKELETAL:  No edema; No deformity  SKIN: Warm and dry LOWER EXTREMITIES: no swelling NEUROLOGIC:  Alert and oriented x 3 PSYCHIATRIC:  Normal affect   ASSESSMENT:    1. Coronary artery disease involving native coronary artery of native heart without angina pectoris   2. Dilated cardiomyopathy (HCC) ejection fraction 25% by echocardiogram from January 2022   3. 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 S/P total left hip arthroplasty   5. S/P total right hip arthroplasty    PLAN:    In order of problems listed above:  Coronary disease stable from that point review on appropriate medication which include antiplatelets therapy and statin. History of cardiomyopathy nonischemic.  Last echocardiogram showed normal ejection fraction.  She will be scheduled to have another echocardiogram in the next 2 months.  In the meantime we will continue guideline  directed medical therapy we did have to lower her Sherryll Burger since she had difficulty tolerating this but now she is doing quite well. Dyslipidemia I did review her K PN which show me total cholesterol 132 HDL 40 we will continue present management. Status post left and right hip arthroplasty recovering nicely doing well.   Medication Adjustments/Labs and Tests Ordered: Current medicines are reviewed at length with the patient today.  Concerns regarding medicines are outlined above.  No orders of the defined types were placed in this encounter.  Medication changes: No orders of the defined types were placed in this encounter.   Signed, Georgeanna Lea, MD, [facility] 12/01/2022 1:45 PM    [facility]

## 2022-12-01 NOTE — Patient Instructions (Signed)
Medication Instructions:  Your physician recommends that you continue on your current medications as directed. Please refer to the Current Medication list given to you today.  *If you need a refill on your cardiac medications before your next appointment, please call your pharmacy*   Lab Work: None Ordered If you have labs (blood work) drawn today and your tests are completely normal, you will receive your results only by: MyChart Message (if you have MyChart) OR A paper copy in the mail If you have any lab test that is abnormal or we need to change your treatment, we will call you to review the results.   Testing/Procedures: None Ordered   Follow-Up: At [facility], you and your health needs are our priority.  As part of our continuing mission to provide you with exceptional heart care, we have created designated Provider Care Teams.  These Care Teams include your primary Cardiologist (physician) and Advanced Practice Providers (APPs -  Physician Assistants and Nurse Practitioners) who all work together to provide you with the care you need, when you need it.  We recommend signing up for the patient portal called "MyChart".  Sign up information is provided on this After Visit Summary.  MyChart is used to connect with patients for Virtual Visits (Telemedicine).  Patients are able to view lab/test results, encounter notes, upcoming appointments, etc.  Non-urgent messages can be sent to your provider as well.   To learn more about what you can do with MyChart, go to [email or web address].    Your next appointment:   6 month(s)  The format for your next appointment:   In Person  Provider:   Robert Krasowski, MD    Other Instructions NA  

## 2022-12-04 ENCOUNTER — Ambulatory Visit
Admission: RE | Admit: 2022-12-04 | Discharge: 2022-12-04 | Disposition: A | Discharge status: Home / Self Care | Payer: BC Managed Care – PPO | Source: Ambulatory Visit | Attending: Internal Medicine | Admitting: Internal Medicine

## 2022-12-04 VITALS — BP 113/68 | HR 83 | Temp 99.1°F | Resp 18

## 2022-12-04 DIAGNOSIS — J4531 Mild persistent asthma with (acute) exacerbation: Secondary | ICD-10-CM | POA: Insufficient documentation

## 2022-12-04 DIAGNOSIS — Z1152 Encounter for screening for COVID-19: Secondary | ICD-10-CM | POA: Diagnosis not present

## 2022-12-04 DIAGNOSIS — J4521 Mild intermittent asthma with (acute) exacerbation: Secondary | ICD-10-CM | POA: Insufficient documentation

## 2022-12-04 DIAGNOSIS — J320 Chronic maxillary sinusitis: Secondary | ICD-10-CM | POA: Diagnosis present

## 2022-12-04 MED ORDER — PREDNISONE 20 MG PO TABS
40.0000 mg | ORAL_TABLET | Freq: Every day | ORAL | 0 refills | Status: AC
Start: 2022-12-04 — End: 2022-12-09

## 2022-12-04 MED ORDER — AMOXICILLIN-POT CLAVULANATE 875-125 MG PO TABS
1.0000 | ORAL_TABLET | Freq: Two times a day (BID) | ORAL | 0 refills | Status: DC
Start: 2022-12-04 — End: 2023-01-01

## 2022-12-04 NOTE — ED Provider Notes (Signed)
[facility]    CSN: [id number] Arrival date & time: 12/04/22  1448      History   Chief Complaint Chief Complaint  Patient presents with   Nasal Congestion    Entered by patient    HPI Erin Fields is a 57 y.o. female  presents for evaluation of URI symptoms for 4 days. Patient reports associated symptoms of sinus pressure/pain with aching teeth, sore throat, postnasal drip causing a cough. Denies N/V/D, fevers, ear pain, body aches, shortness of breath. Patient does have a hx of asthma and is a previous smoker.  Does report wheezing with her symptoms and has used her albuterol inhaler with relief of the symptoms.  Patient does have a history of chronic sinusitis and is scheduled for sinus surgery in November.  No known sick contacts.  Pt has taken Mucinex, DayQuil OTC for symptoms. Pt has no other concerns at this time.   HPI  Past Medical History:  Diagnosis Date   Allergic rhinitis due to animal (cat) (dog) hair and dander 04/27/2020   Allergic rhinitis due to pollen 04/27/2020   Anxiety    Anxiety and depression 01/20/2018   Arthritis    Asthma    Asthma, persistent controlled 10/05/2018   B12 deficiency 11/27/2019   Borderline diabetes 12/17/2015   Breast cancer (HCC)    Cancer (HCC) 2019   right breast cancer   CERVICAL CANCER, HX OF 12/06/2006   Qualifier: Diagnosis of  By: Lovell Sheehan MD, John E    Chronic sinusitis 01/26/2015   Chronic venous insufficiency 09/02/2015   Colon polyps    Congestive heart failure (HCC) 04/29/2020   Controlled type 2 diabetes mellitus without complication, without long-term current use of insulin (HCC) 05/28/2017   Coronary artery disease 25% of RCA, 20% intermediate branch, 40% diagonal branch based on cardiac cath from 2022 07/22/2020   Crohn disease (HCC) 11/29/2016   Depression    Diabetes mellitus without complication (HCC)    type 2   Dilated cardiomyopathy (HCC) ejection fraction 25% by echocardiogram from  January 2022 04/29/2020   Dyspnea 09/15/2014   Elevated triglycerides with high cholesterol 11/26/2013   Environmental allergies    Eustachian tube dysfunction 12/04/2014   Ex-smoker 11/18/2015   Family history of breast cancer    Genetic testing 07/16/2017   Negative genetic testing on the multi-cancer panel.  The Multi-Gene Panel offered by [name] includes sequencing and/or deletion duplication testing of the following 83 genes: ALK, APC, ATM, AXIN2,BAP1,  BARD1, BLM, BMPR1A, BRCA1, BRCA2, BRIP1, CASR, CDC73, CDH1, CDK4, CDKN1B, CDKN1C, CDKN2A (p14ARF), CDKN2A (p16INK4a), CEBPA, CHEK2, CTNNA1, DICER1, DIS3L2, EGFR (c.2369C>T, p.Thr790Met variant onl   GERD (gastroesophageal reflux disease)    HEADACHE 12/06/2006   Qualifier: Diagnosis of  By: Lovell Sheehan MD, John E    Hemothorax on left 10/02/2020   History of adenomatous polyp of colon 11/19/2017   Hyperlipidemia    Hypertension    Hypothyroidism    IDA (iron deficiency anemia) 11/06/2019   Impaired fasting glucose 12/12/2017   Insomnia 08/20/2013   Irritable bowel syndrome 11/29/2006   Qualifier: Diagnosis of  By: Lovell Sheehan MD, John E    Left sided abdominal pain 12/13/2017   Liver hemangioma 08/24/2014   Malignant neoplasm of upper-outer quadrant of right breast in female, 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   Migraines    Mild persistent asthma, uncomplicated 04/27/2020   Nasal polyp, unspecified 01/10/2018   Oral herpes 04/20/2013   Other allergic rhinitis 09/15/2014   Perforation  of right 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 ICD placement 10/02/2020   Personal history of radiation therapy 2019   36 radiation tx   PONV (postoperative nausea and vomiting)    scopolamine patch helps   Recurrent infections 10/05/2018   Right upper lobe pulmonary nodule 01/10/2018   Symptomatic varicose veins, right 09/02/2015   Ulcerative colitis (HCC)    Varicose veins     Patient Active Problem List   Diagnosis Date Noted   S/P total left hip  arthroplasty 05/23/2022   DJD (degenerative joint disease) 12/20/2021   S/P total right hip arthroplasty 12/20/2021   Diabetes mellitus with insulin therapy (HCC) 10/24/2021   Lymphedema of breast 10/24/2021   Breast cancer (HCC) 05/11/2021   Hemothorax on left 10/02/2020   S/P evacuation of hematoma 10/02/2020   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 10/02/2020   Hypovolemic shock (HCC) 10/02/2020   Anxiety    Coronary artery disease 25% of RCA, 20% intermediate branch, 40% diagonal branch based on cardiac cath from 2022 07/22/2020   Dilated cardiomyopathy (HCC) ejection fraction 25% by echocardiogram from January 2022 04/29/2020   Congestive heart failure (HCC) 04/29/2020   Allergic rhinitis due to animal (cat) (dog) hair and dander 04/27/2020   Allergic rhinitis due to pollen 04/27/2020   Mild persistent asthma, uncomplicated 04/27/2020   PONV (postoperative nausea and vomiting)    Migraines    GERD (gastroesophageal reflux disease)    Environmental allergies    Diabetes mellitus without complication (HCC)    Depression    Colon polyps    B12 deficiency 11/27/2019   IDA (iron deficiency anemia) 11/06/2019   Asthma, persistent controlled 10/05/2018   Recurrent infections 10/05/2018   Anxiety and depression 01/20/2018   Right upper lobe pulmonary nodule 01/10/2018   Nasal polyp, unspecified 01/10/2018   Left sided abdominal pain 12/13/2017   Impaired fasting glucose 12/12/2017   History of adenomatous polyp of colon 11/19/2017   Genetic testing 07/16/2017   Family history of breast cancer    Controlled type 2 diabetes mellitus without complication, without long-term current use of insulin (HCC) 05/28/2017   Personal history of radiation therapy 2019   Cancer (HCC) 2019   Malignant neoplasm of upper-outer quadrant of right breast in female, 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   Crohn disease (HCC) 11/29/2016   Borderline diabetes 12/17/2015   Ex-smoker  11/18/2015   Chronic venous insufficiency 09/02/2015   Symptomatic varicose veins, right 09/02/2015   Chronic sinusitis 01/26/2015   Eustachian tube dysfunction 12/04/2014   Other allergic rhinitis 09/15/2014   Dyspnea 09/15/2014   Liver hemangioma 08/24/2014   Elevated triglycerides with high cholesterol 11/26/2013   Insomnia 08/20/2013   Hypertension 08/20/2013   Ulcerative colitis (HCC) 04/20/2013   Oral herpes 04/20/2013   Hyperlipidemia 12/06/2006   HEADACHE 12/06/2006   CERVICAL CANCER, HX OF 12/06/2006   Hypothyroidism 11/29/2006   Asthma 11/29/2006   IRRITABLE BOWEL SYNDROME 11/29/2006    Past Surgical History:  Procedure Laterality Date   BREAST BIOPSY Right 02/2017   BREAST BIOPSY Right 11/07/2019   BREAST LUMPECTOMY Right 04/24/2017   BREAST LUMPECTOMY WITH RADIOACTIVE SEED AND SENTINEL LYMPH NODE BIOPSY Right 04/24/2017   Procedure: RIGHT BREAST LUMPECTOMY WITH RADIOACTIVE SEED AND SENTINEL LYMPH NODE BIOPSY;  Surgeon: Almond Lint, MD;  Location: [facility];  Service: General;  Laterality: Right;   CORONARY ARTERY BYPASS GRAFT     crapal tunnel relase Left 05/2018   ENDOMETRIAL ABLATION  2010   Had abnormal uterine  bleeding (heavy and frequent), performed in [place], menses stopped completely 2 years after   EXPLORATORY LAPAROTOMY     Had exploratory surgery at the age of 73 for abdominal pain with cyst found on her left ovary, not treated surgically   FOOT SURGERY Right 2012   GENERATOR REMOVAL  10/02/2020   Procedure: GENERATOR REMOVAL;  Surgeon: Purcell Nails, MD;  Location: [facility] OR;  Service: Thoracic;;   HAND SURGERY Right    ICD IMPLANT N/A 10/01/2020   Procedure: ICD IMPLANT;  Surgeon: Regan Lemming, MD;  Location: [facility];  Service: Cardiovascular;  Laterality: N/A;   ICD LEAD REMOVAL N/A 10/02/2020   Procedure: ICD LEAD REMOVAL;  Surgeon: Purcell Nails, MD;  Location: [facility] OR;  Service: Thoracic;  Laterality: N/A;    MEDIASTERNOTOMY N/A 10/02/2020   Procedure: MEDIAN STERNOTOMY REPAIR PERFORATED VENTRICLE EVACUATION LEFT HEMOTHORAX;  Surgeon: Purcell Nails, MD;  Location: [facility] OR;  Service: Thoracic;  Laterality: N/A;   RE-EXCISION OF BREAST LUMPECTOMY Right 05/15/2017   Procedure: RE-EXCISION OF BREAST LUMPECTOMY;  Surgeon: Almond Lint, MD;  Location: [facility];  Service: General;  Laterality: Right;   RIGHT/LEFT HEART CATH AND CORONARY ANGIOGRAPHY N/A 06/21/2020   Procedure: RIGHT/LEFT HEART CATH AND CORONARY ANGIOGRAPHY;  Surgeon: Yvonne Kendall, MD;  Location: [facility];  Service: Cardiovascular;  Laterality: N/A;   ROBOTIC ASSISTED SALPINGO OOPHERECTOMY Bilateral 08/29/2019   Procedure: XI ROBOTIC ASSISTED SALPINGO OOPHORECTOMY;  Surgeon: [name], MD;  Location: [facility];  Service: Gynecology;  Laterality: Bilateral;   Septoplasty with turbinate reduction     TEE WITHOUT CARDIOVERSION N/A 10/02/2020   Procedure: TRANSESOPHAGEAL ECHOCARDIOGRAM (TEE);  Surgeon: Purcell Nails, MD;  Location: [facility] OR;  Service: Thoracic;  Laterality: N/A;   TONSILLECTOMY  1980   TOTAL HIP ARTHROPLASTY Right 12/20/2021   Procedure: RIGHT TOTAL HIP ARTHROPLASTY ANTERIOR APPROACH;  Surgeon: Sheral Apley, MD;  Location: [facility] OR;  Service: Orthopedics;  Laterality: Right;   TOTAL HIP ARTHROPLASTY Left 05/23/2022   Procedure: TOTAL HIP ARTHROPLASTY ANTERIOR APPROACH;  Surgeon: Sheral Apley, MD;  Location: [facility] OR;  Service: Orthopedics;  Laterality: Left;   VEIN SURGERY     Removal Right Leg   WISDOM TOOTH EXTRACTION      OB History     Gravida  3   Para  2   Term      Preterm      AB  1   Living  1      SAB      IAB      Ectopic      Multiple      Live Births               Home Medications    Prior to Admission medications   Medication Sig Start Date End Date Taking? Authorizing Provider  amoxicillin-clavulanate (AUGMENTIN) 875-125  MG tablet Take 1 tablet by mouth every 12 (twelve) hours. 12/04/22  Yes Radford Pax, NP  predniSONE (DELTASONE) 20 MG tablet Take 2 tablets (40 mg total) by mouth daily with breakfast for 5 days. 12/04/22 12/09/22 Yes Radford Pax, NP  acetaminophen (TYLENOL) 500 MG tablet Take 2 tablets (1,000 mg total) by mouth every 6 (six) hours as needed for moderate pain or mild pain. 12/21/21   Jenne Pane, PA-C  acyclovir (ZOVIRAX) 400 MG tablet Take 1 tablet (400 mg total) by mouth 3 (three) times daily as  needed. Patient taking differently: Take 400 mg by mouth 3 (three) times daily as needed (out breaks). 07/26/22   Copland, Gwenlyn Found, MD  Adalimumab 80 MG/0.8ML PNKT Inject 80 mg into the skin every 14 (fourteen) days. 12/01/19   [provider]  albuterol (VENTOLIN HFA) 108 (90 Base) MCG/ACT inhaler Inhale 2 puffs into the lungs every 6 (six) hours as needed for shortness of breath. 09/22/20   [provider]  aspirin EC 81 MG tablet Take 1 tablet (81 mg total) by mouth 2 (two) times daily. To prevent blood clots for 30 days after surgery. 05/24/22   Jenne Pane, PA-C  Azelastine HCl 0.15 % SOLN Place 2 sprays into both nostrils 2 (two) times daily. 07/16/20   [provider]  carvedilol (COREG) 12.5 MG tablet TAKE 1 TABLET (12.5MG  TOTAL) BY MOUTH TWICE A DAY WITH MEALS Patient taking differently: Take 12.5 mg by mouth 2 (two) times daily with a meal. 10/18/22   Laurey Morale, MD  cetirizine (ZYRTEC) 10 MG tablet Take 10 mg by mouth daily.    [provider]  dapagliflozin propanediol (FARXIGA) 10 MG TABS tablet Take 1 tablet (10 mg total) by mouth daily before breakfast. 12/27/21   Laurey Morale, MD  diphenoxylate-atropine (LOMOTIL) 2.5-0.025 MG tablet Take 2 tablets by mouth 4 (four) times daily as needed for diarrhea or loose stools.    [provider]  docusate sodium (COLACE) 100 MG capsule Take 1 capsule (100 mg total) by mouth 2 (two) times daily as  needed for mild constipation or moderate constipation. 05/24/22 05/24/23  Jenne Pane, PA-C  EPINEPHrine 0.3 mg/0.3 mL IJ SOAJ injection Inject 0.3 mg into the muscle as needed for anaphylaxis.    [provider]  Hyoscyamine Sulfate 0.375 MG TBCR Take 0.375 mg by mouth 2 (two) times daily as needed (stomach cramping).    [provider]  ipratropium (ATROVENT) 0.06 % nasal spray Place 2 sprays into both nostrils 4 (four) times daily. Use as needed 06/29/21   Copland, Gwenlyn Found, MD  lansoprazole (PREVACID SOLUTAB) 15 MG disintegrating tablet Take 15 mg by mouth 2 (two) times daily before a meal.    [provider]  levothyroxine (SYNTHROID) 75 MCG tablet TAKE 1 TABLET BY MOUTH EVERY DAY BEFORE BREAKFAST Patient taking differently: Take 75 mcg by mouth daily before breakfast. 10/09/22   Copland, Gwenlyn Found, MD  MAGNESIUM PO Take 250 mg by mouth daily.    [provider]  Menthol, Topical Analgesic, (BIOFREEZE COLORLESS) 4 % GEL Apply 1 application topically daily as needed (Hip pain).    [provider]  mesalamine (LIALDA) 1.2 g EC tablet Take 2.4 g by mouth in the morning and at bedtime.     [provider]  metFORMIN (GLUCOPHAGE) 500 MG tablet TAKE 1 TABLET BY MOUTH TWICE A DAY WITH FOOD 10/23/22   Copland, Gwenlyn Found, MD  montelukast (SINGULAIR) 10 MG tablet Take 10 mg by mouth at bedtime.     [provider]  promethazine (PHENERGAN) 12.5 MG tablet Take 1 tablet (12.5 mg total) by mouth every 6 (six) hours as needed for nausea or vomiting. 05/24/22   Jenne Pane, PA-C  rosuvastatin (CRESTOR) 20 MG tablet TAKE 1 TABLET BY MOUTH EVERY DAY 08/18/22   Copland, Gwenlyn Found, MD  sacubitril-valsartan (ENTRESTO) 49-51 MG Take 1 tablet by mouth 2 (two) times daily. 06/21/22   Laurey Morale, MD  spironolactone (ALDACTONE) 25 MG tablet Take  1 tablet (25 mg total) by mouth daily. 11/01/21   Laurey Morale, MD  SYMBICORT 160-4.5 MCG/ACT inhaler INHALE 2  PUFFS INTO THE LUNGS TWICE A DAY Patient taking differently: Inhale 2 puffs into the lungs 2 (two) times daily. 02/20/22   Saguier, Ramon Dredge, PA-C  triamcinolone (NASACORT) 55 MCG/ACT AERO nasal inhaler Place 2 sprays into the nose daily.    [provider]  venlafaxine XR (EFFEXOR-XR) 150 MG 24 hr capsule Take 1 capsule (150 mg total) by mouth daily with breakfast. 10/25/22   Copland, Gwenlyn Found, MD  zinc gluconate 50 MG tablet Take 50 mg by mouth daily.    [provider]  zolpidem (AMBIEN) 5 MG tablet TAKE 1 TABLET BY MOUTH EVERY DAY AT BEDTIME AS NEEDED FOR SLEEP STRENGTH: 5 MG Patient taking differently: Take 5 mg by mouth at bedtime as needed for sleep. TAKE 1 TABLET BY MOUTH EVERY DAY AT BEDTIME AS NEEDED FOR SLEEP Strength: 5 mg 08/22/22   Copland, Gwenlyn Found, MD    Family History Family History  Problem Relation Age of Onset   Arthritis Mother 23       Deceased   Breast cancer Mother    Lung cancer Mother    Hyperlipidemia Mother    Heart disease Mother    Hypertension Mother    Diabetes Mother    Crohn's disease Mother    Diabetes Maternal Grandfather    Heart disease Maternal Grandfather    Heart attack Maternal Grandfather    Leukemia Maternal Grandfather    Colitis Sister    Leukemia Cousin 3       mat cousin   Lung cancer Father    Brain cancer Father     Social History Social History   Tobacco Use   Smoking status: Former    Current packs/day: 0.00    Average packs/day: 3.0 packs/day for 15.0 years (45.0 ttl pk-yrs)    Types: Cigarettes    Start date: 09/15/1978    Quit date: 09/14/1993    Years since quitting: 29.2   Smokeless tobacco: Never   Tobacco comments:    Quit 26 yrs ago  Vaping Use   Vaping status: Never Used  Substance Use Topics   Alcohol use: Yes    Alcohol/week: 1.0 - 2.0 standard drink of alcohol    Types: 1 - 2 Glasses of wine per week    Comment: occ   Drug use: No     Allergies   Losartan potassium, Ace inhibitors,  Aspirin, Ceclor [cefaclor], Sulfa antibiotics, and Lisinopril   Review of Systems Review of Systems  HENT:  Positive for congestion, sinus pressure, sinus pain and sore throat.      Physical Exam Triage Vital Signs ED Triage Vitals  Encounter Vitals Group     BP 12/04/22 1503 113/68     Systolic BP Percentile --      Diastolic BP Percentile --      Pulse Rate 12/04/22 1503 83     Resp 12/04/22 1503 18     Temp 12/04/22 1503 99.1 F (37.3 C)     Temp Source 12/04/22 1503 Oral     SpO2 12/04/22 1503 95 %     Weight --      Height --      Head Circumference --      Peak Flow --      Pain Score 12/04/22 1509 0     Pain Loc --  Pain Education --      Exclude from Growth Chart --    No data found.  Updated Vital Signs BP 113/68 (BP Location: Right Arm)   Pulse 83   Temp 99.1 F (37.3 C) (Oral)   Resp 18   SpO2 95%   Visual Acuity Right Eye Distance:   Left Eye Distance:   Bilateral Distance:    Right Eye Near:   Left Eye Near:    Bilateral Near:     Physical Exam Vitals and nursing note reviewed.  Constitutional:      General: She is not in acute distress.    Appearance: She is well-developed. She is not ill-appearing.  HENT:     Head: Normocephalic and atraumatic.     Right Ear: Tympanic membrane and ear canal normal.     Left Ear: Tympanic membrane and ear canal normal.     Nose: Congestion present.     Right Turbinates: Swollen and pale.     Left Turbinates: Swollen and pale.     Right Sinus: Maxillary sinus tenderness present. No frontal sinus tenderness.     Left Sinus: Maxillary sinus tenderness present. No frontal sinus tenderness.     Mouth/Throat:     Mouth: Mucous membranes are moist.     Pharynx: Oropharynx is clear. Uvula midline. No posterior oropharyngeal erythema.     Tonsils: No tonsillar exudate or tonsillar abscesses.  Eyes:     Conjunctiva/sclera: Conjunctivae normal.     Pupils: Pupils are equal, round, and reactive to light.   Cardiovascular:     Rate and Rhythm: Normal rate and regular rhythm.     Heart sounds: Normal heart sounds.  Pulmonary:     Effort: Pulmonary effort is normal.     Breath sounds: Normal breath sounds.  Musculoskeletal:     Cervical back: Normal range of motion and neck supple.  Lymphadenopathy:     Cervical: No cervical adenopathy.  Skin:    General: Skin is warm and dry.  Neurological:     General: No focal deficit present.     Mental Status: She is alert and oriented to person, place, and time.  Psychiatric:        Mood and Affect: Mood normal.        Behavior: Behavior normal.      UC Treatments / Results  Labs (all labs ordered are listed, but only abnormal results are displayed) Labs Reviewed  SARS CORONAVIRUS 2 (TAT 6-24 HRS)    EKG   Radiology No results found.  Procedures Procedures (including critical care time)  Medications Ordered in UC Medications - No data to display  Initial Impression / Assessment and Plan / UC Course  I have reviewed the triage vital signs and the nursing notes.  Pertinent labs & imaging results that were available during my care of the patient were reviewed by me and considered in my medical decision making (see chart for details).     COVID PCR and will contact if positive.  Given symptoms and history we will start Augmentin.  Prednisone for asthma exacerbation.  She will continue her albuterol inhaler.  Continue nasal sprays and nasal rinses.  PCP follow-up if symptoms do not improve.  ER precautions reviewed and patient verbalized understanding. Final Clinical Impressions(s) / UC Diagnoses   Final diagnoses:  Chronic maxillary sinusitis  Mild intermittent asthma with acute exacerbation     Discharge Instructions      The clinic will contact you with  results of the COVID test done today if positive.  Start Augmentin and prednisone.  Continue your nasal rinses and nasal sprays.  Lots of rest and fluids.  Please follow-up  with your PCP if your symptoms do not improve.  Please go to the ER if you develop any worsening symptoms.  I hope you feel better soon!    ED Prescriptions     Medication Sig Dispense Auth. Provider   amoxicillin-clavulanate (AUGMENTIN) 875-125 MG tablet Take 1 tablet by mouth every 12 (twelve) hours. 14 tablet Radford Pax, NP   predniSONE (DELTASONE) 20 MG tablet Take 2 tablets (40 mg total) by mouth daily with breakfast for 5 days. 10 tablet Radford Pax, NP      PDMP not reviewed this encounter.   Radford Pax, NP 12/04/22 1525

## 2022-12-04 NOTE — Discharge Instructions (Signed)
The clinic will contact you with results of the COVID test done today if positive.  Start Augmentin and prednisone.  Continue your nasal rinses and nasal sprays.  Lots of rest and fluids.  Please follow-up with your PCP if your symptoms do not improve.  Please go to the ER if you develop any worsening symptoms.  I hope you feel better soon!

## 2022-12-04 NOTE — ED Triage Notes (Signed)
Pt presents to UC w/ c/o nasal congestion, sinus facial pain, sore throat x3-4 days. Dayquil, mucinex do not help.

## 2022-12-05 ENCOUNTER — Encounter: Payer: Self-pay | Admitting: Family Medicine

## 2022-12-05 DIAGNOSIS — J0101 Acute recurrent maxillary sinusitis: Secondary | ICD-10-CM

## 2022-12-05 LAB — SARS CORONAVIRUS 2 (TAT 6-24 HRS): SARS Coronavirus 2: NEGATIVE

## 2022-12-05 NOTE — Telephone Encounter (Signed)
I think what we need to do is print out notes from 12/04/22 10/04/22 08/31/22 And CT scan dated 08/20/22 And fax to Dr Suszanne Conners office   Phone number  [phone]  Please see the MyChart message reply(ies) for my assessment and plan.  The patient gave consent for this Medical Advice Message and is aware that it may result in a bill to their insurance company as well as the possibility that this may result in a co-payment or deductible. They are an established patient, but are not seeking medical advice exclusively about a problem treated during an in person or video visit in the last 7 days. I did not recommend an in person or video visit within 7 days of my reply.  I spent a total of 10 minutes cumulative time within 7 days through MyChart messaging Abbe Amsterdam, MD

## 2022-12-05 NOTE — Telephone Encounter (Signed)
I'm not sure where the pt is being seen, also would med rec be the ones to send this over?

## 2022-12-07 NOTE — Telephone Encounter (Signed)
Information has been faxed  

## 2022-12-08 ENCOUNTER — Other Ambulatory Visit: Payer: Self-pay | Admitting: Otolaryngology

## 2022-12-08 DIAGNOSIS — J328 Other chronic sinusitis: Secondary | ICD-10-CM

## 2022-12-12 ENCOUNTER — Encounter: Payer: Self-pay | Admitting: Family

## 2022-12-20 ENCOUNTER — Other Ambulatory Visit: Payer: Self-pay | Admitting: Family Medicine

## 2022-12-20 DIAGNOSIS — F5104 Psychophysiologic insomnia: Secondary | ICD-10-CM

## 2022-12-25 ENCOUNTER — Telehealth (HOSPITAL_COMMUNITY): Payer: Self-pay | Admitting: Pharmacy Technician

## 2022-12-25 ENCOUNTER — Other Ambulatory Visit (HOSPITAL_COMMUNITY): Payer: Self-pay

## 2022-12-25 NOTE — Telephone Encounter (Signed)
Advanced Heart Failure Patient Advocate Encounter  Prior Authorization for Sherryll Burger has been approved.    PA# [id number] Effective dates: 12/25/22 through 12/25/23  Archer Asa, CPhT

## 2022-12-25 NOTE — Telephone Encounter (Signed)
Patient Advocate Encounter   Received notification from [employer] that prior authorization for Sherryll Burger is required.   PA submitted on CoverMyMeds Key [id number] Status is pending   Will continue to follow.

## 2022-12-27 ENCOUNTER — Ambulatory Visit
Admission: RE | Admit: 2022-12-27 | Discharge: 2022-12-27 | Disposition: A | Discharge status: Home / Self Care | Payer: BC Managed Care – PPO | Source: Ambulatory Visit | Attending: Otolaryngology | Admitting: Otolaryngology

## 2022-12-27 ENCOUNTER — Encounter: Payer: Self-pay | Admitting: Family

## 2022-12-27 DIAGNOSIS — J328 Other chronic sinusitis: Secondary | ICD-10-CM

## 2022-12-28 NOTE — Progress Notes (Signed)
[facility] [address] [place], [place] [address] [phone]  Date:  01/01/2023   Name:  Erin Fields   DOB:  1965-11-19   MRN:  [id number]  PCP:  Pearline Cables, MD    Chief Complaint: Nasal Congestion Juliann Pares January. Pt sees ENT. Scheduled for R ethomidectomy in November. )   History of Present Illness:  Erin Fields is a 57 y.o. very pleasant female patient who presents with the following:  Pt seen today with concern of congestion- she has noted this for about 5 days Last visit with myself was in May - history significant for right-sided breast cancer, type 2 diabetes, ulcerative colitis on Humira ,nonobstructive coronary artery disease, hypertension, hyperlipidemia, previous history of cardiomyopathy and ICD placement with right ventricular perforation, bilateral hip replacement   Followed by oncology and advanced CHF clinic  She is a GI pt at [facility]   Flu shot and COVID booster, deferred due to acute illness  A1c can be updated-will do today  Lab Results  Component Value Date   HGBA1C 6.6 (H) 05/11/2022   She saw Dr Suszanne Conners with ENT in August and did a CT last week- she is going to have sinus surgery assuming her heart is in good enough shape.  They plan to "clean out 2 blocked cavities" in her sinuses surgically on November 1.  She will have an echo and cardiology appointment about 10 days prior  She did not test for covid as of yet with this current illness No fever She is coughing esp at night, sinus and some chest congestion She is blowing green out of her nose  No vomiting or diarrhea   Pt notes these sinus infections are occurring on a regular basis approximately once a month- most recent just short of a month ago, treated with augmentin and pred per UC  She did respond and did well until about 5 days ago  Patient Active Problem List   Diagnosis Date Noted   S/P total left hip arthroplasty  05/23/2022   DJD (degenerative joint disease) 12/20/2021   S/P total right hip arthroplasty 12/20/2021   Diabetes mellitus with insulin therapy (HCC) 10/24/2021   Lymphedema of breast 10/24/2021   Breast cancer (HCC) 05/11/2021   Hemothorax on left 10/02/2020   S/P evacuation of hematoma 10/02/2020   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 10/02/2020   Hypovolemic shock (HCC) 10/02/2020   Anxiety    Coronary artery disease 25% of RCA, 20% intermediate branch, 40% diagonal branch based on cardiac cath from 2022 07/22/2020   Dilated cardiomyopathy (HCC) ejection fraction 25% by echocardiogram from January 2022 04/29/2020   Congestive heart failure (HCC) 04/29/2020   Allergic rhinitis due to animal (cat) (dog) hair and dander 04/27/2020   Allergic rhinitis due to pollen 04/27/2020   Mild persistent asthma, uncomplicated 04/27/2020   PONV (postoperative nausea and vomiting)    Migraines    GERD (gastroesophageal reflux disease)    Environmental allergies    Diabetes mellitus without complication (HCC)    Depression    Colon polyps    B12 deficiency 11/27/2019   IDA (iron deficiency anemia) 11/06/2019   Asthma, persistent controlled 10/05/2018   Recurrent infections 10/05/2018   Anxiety and depression 01/20/2018   Right upper lobe pulmonary nodule 01/10/2018   Nasal polyp, unspecified 01/10/2018   Left sided abdominal pain 12/13/2017   Impaired fasting glucose 12/12/2017   History  of adenomatous polyp of colon 11/19/2017   Genetic testing 07/16/2017   Family history of breast cancer    Controlled type 2 diabetes mellitus without complication, without long-term current use of insulin (HCC) 05/28/2017   Personal history of radiation therapy 2019   Cancer (HCC) 2019   Malignant neoplasm of upper-outer quadrant of right breast in female, 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   Crohn disease (HCC) 11/29/2016   Borderline diabetes 12/17/2015   Ex-smoker 11/18/2015    Chronic venous insufficiency 09/02/2015   Symptomatic varicose veins, right 09/02/2015   Chronic sinusitis 01/26/2015   Eustachian tube dysfunction 12/04/2014   Other allergic rhinitis 09/15/2014   Dyspnea 09/15/2014   Liver hemangioma 08/24/2014   Elevated triglycerides with high cholesterol 11/26/2013   Insomnia 08/20/2013   Hypertension 08/20/2013   Ulcerative colitis (HCC) 04/20/2013   Oral herpes 04/20/2013   Hyperlipidemia 12/06/2006   HEADACHE 12/06/2006   CERVICAL CANCER, HX OF 12/06/2006   Hypothyroidism 11/29/2006   Asthma 11/29/2006   IRRITABLE BOWEL SYNDROME 11/29/2006    Past Medical History:  Diagnosis Date   Allergic rhinitis due to animal (cat) (dog) hair and dander 04/27/2020   Allergic rhinitis due to pollen 04/27/2020   Anxiety    Anxiety and depression 01/20/2018   Arthritis    Asthma    Asthma, persistent controlled 10/05/2018   B12 deficiency 11/27/2019   Borderline diabetes 12/17/2015   Breast cancer (HCC)    Cancer (HCC) 2019   right breast cancer   CERVICAL CANCER, HX OF 12/06/2006   Qualifier: Diagnosis of  By: Lovell Sheehan MD, John E    Chronic sinusitis 01/26/2015   Chronic venous insufficiency 09/02/2015   Colon polyps    Congestive heart failure (HCC) 04/29/2020   Controlled type 2 diabetes mellitus without complication, without long-term current use of insulin (HCC) 05/28/2017   Coronary artery disease 25% of RCA, 20% intermediate branch, 40% diagonal branch based on cardiac cath from 2022 07/22/2020   Crohn disease (HCC) 11/29/2016   Depression    Diabetes mellitus without complication (HCC)    type 2   Dilated cardiomyopathy (HCC) ejection fraction 25% by echocardiogram from January 2022 04/29/2020   Dyspnea 09/15/2014   Elevated triglycerides with high cholesterol 11/26/2013   Environmental allergies    Eustachian tube dysfunction 12/04/2014   Ex-smoker 11/18/2015   Family history of breast cancer    Genetic testing 07/16/2017    Negative genetic testing on the multi-cancer panel.  The Multi-Gene Panel offered by [name] includes sequencing and/or deletion duplication testing of the following 83 genes: ALK, APC, ATM, AXIN2,BAP1,  BARD1, BLM, BMPR1A, BRCA1, BRCA2, BRIP1, CASR, CDC73, CDH1, CDK4, CDKN1B, CDKN1C, CDKN2A (p14ARF), CDKN2A (p16INK4a), CEBPA, CHEK2, CTNNA1, DICER1, DIS3L2, EGFR (c.2369C>T, p.Thr790Met variant onl   GERD (gastroesophageal reflux disease)    HEADACHE 12/06/2006   Qualifier: Diagnosis of  By: Lovell Sheehan MD, John E    Hemothorax on left 10/02/2020   History of adenomatous polyp of colon 11/19/2017   Hyperlipidemia    Hypertension    Hypothyroidism    IDA (iron deficiency anemia) 11/06/2019   Impaired fasting glucose 12/12/2017   Insomnia 08/20/2013   Irritable bowel syndrome 11/29/2006   Qualifier: Diagnosis of  By: Lovell Sheehan MD, John E    Left sided abdominal pain 12/13/2017   Liver hemangioma 08/24/2014   Malignant neoplasm of upper-outer quadrant of right breast in female, 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   Migraines    Mild persistent asthma, uncomplicated 04/27/2020   Nasal  polyp, unspecified 01/10/2018   Oral herpes 04/20/2013   Other allergic rhinitis 09/15/2014   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 10/02/2020   Personal history of radiation therapy 2019   36 radiation tx   PONV (postoperative nausea and vomiting)    scopolamine patch helps   Recurrent infections 10/05/2018   Right upper lobe pulmonary nodule 01/10/2018   Symptomatic varicose veins, right 09/02/2015   Ulcerative colitis (HCC)    Varicose veins     Past Surgical History:  Procedure Laterality Date   BREAST BIOPSY Right 02/2017   BREAST BIOPSY Right 11/07/2019   BREAST LUMPECTOMY Right 04/24/2017   BREAST LUMPECTOMY WITH RADIOACTIVE SEED AND SENTINEL LYMPH NODE BIOPSY Right 04/24/2017   Procedure: RIGHT BREAST LUMPECTOMY WITH RADIOACTIVE SEED AND SENTINEL LYMPH NODE BIOPSY;  Surgeon:  Almond Lint, MD;  Location: [facility];  Service: General;  Laterality: Right;   CORONARY ARTERY BYPASS GRAFT     crapal tunnel relase Left 05/2018   ENDOMETRIAL ABLATION  2010   Had abnormal uterine bleeding (heavy and frequent), performed in [place], menses stopped completely 2 years after   EXPLORATORY LAPAROTOMY     Had exploratory surgery at the age of 59 for abdominal pain with cyst found on her left ovary, not treated surgically   FOOT SURGERY Right 2012   GENERATOR REMOVAL  10/02/2020   Procedure: GENERATOR REMOVAL;  Surgeon: Purcell Nails, MD;  Location: [facility] OR;  Service: Thoracic;;   HAND SURGERY Right    ICD IMPLANT N/A 10/01/2020   Procedure: ICD IMPLANT;  Surgeon: Regan Lemming, MD;  Location: [facility];  Service: Cardiovascular;  Laterality: N/A;   ICD LEAD REMOVAL N/A 10/02/2020   Procedure: ICD LEAD REMOVAL;  Surgeon: Purcell Nails, MD;  Location: [facility] OR;  Service: Thoracic;  Laterality: N/A;   MEDIASTERNOTOMY N/A 10/02/2020   Procedure: MEDIAN STERNOTOMY REPAIR PERFORATED VENTRICLE EVACUATION LEFT HEMOTHORAX;  Surgeon: Purcell Nails, MD;  Location: [facility] OR;  Service: Thoracic;  Laterality: N/A;   RE-EXCISION OF BREAST LUMPECTOMY Right 05/15/2017   Procedure: RE-EXCISION OF BREAST LUMPECTOMY;  Surgeon: Almond Lint, MD;  Location: [facility];  Service: General;  Laterality: Right;   RIGHT/LEFT HEART CATH AND CORONARY ANGIOGRAPHY N/A 06/21/2020   Procedure: RIGHT/LEFT HEART CATH AND CORONARY ANGIOGRAPHY;  Surgeon: Yvonne Kendall, MD;  Location: [facility];  Service: Cardiovascular;  Laterality: N/A;   ROBOTIC ASSISTED SALPINGO OOPHERECTOMY Bilateral 08/29/2019   Procedure: XI ROBOTIC ASSISTED SALPINGO OOPHORECTOMY;  Surgeon: [name], MD;  Location: [facility];  Service: Gynecology;  Laterality: Bilateral;   Septoplasty with turbinate reduction     TEE WITHOUT CARDIOVERSION N/A  10/02/2020   Procedure: TRANSESOPHAGEAL ECHOCARDIOGRAM (TEE);  Surgeon: Purcell Nails, MD;  Location: [facility] OR;  Service: Thoracic;  Laterality: N/A;   TONSILLECTOMY  1980   TOTAL HIP ARTHROPLASTY Right 12/20/2021   Procedure: RIGHT TOTAL HIP ARTHROPLASTY ANTERIOR APPROACH;  Surgeon: Sheral Apley, MD;  Location: [facility] OR;  Service: Orthopedics;  Laterality: Right;   TOTAL HIP ARTHROPLASTY Left 05/23/2022   Procedure: TOTAL HIP ARTHROPLASTY ANTERIOR APPROACH;  Surgeon: Sheral Apley, MD;  Location: [facility] OR;  Service: Orthopedics;  Laterality: Left;   VEIN SURGERY     Removal Right Leg   WISDOM TOOTH EXTRACTION      Social History   Tobacco Use   Smoking status: Former    Current packs/day: 0.00    Average packs/day:  3.0 packs/day for 15.0 years (45.0 ttl pk-yrs)    Types: Cigarettes    Start date: 09/15/1978    Quit date: 09/14/1993    Years since quitting: 29.3   Smokeless tobacco: Never   Tobacco comments:    Quit 26 yrs ago  Vaping Use   Vaping status: Never Used  Substance Use Topics   Alcohol use: Yes    Alcohol/week: 1.0 - 2.0 standard drink of alcohol    Types: 1 - 2 Glasses of wine per week    Comment: occ   Drug use: No    Family History  Problem Relation Age of Onset   Arthritis Mother 47       Deceased   Breast cancer Mother    Lung cancer Mother    Hyperlipidemia Mother    Heart disease Mother    Hypertension Mother    Diabetes Mother    Crohn's disease Mother    Diabetes Maternal Grandfather    Heart disease Maternal Grandfather    Heart attack Maternal Grandfather    Leukemia Maternal Grandfather    Colitis Sister    Leukemia Cousin 3       mat cousin   Lung cancer Father    Brain cancer Father     Allergies  Allergen Reactions   Losartan Potassium Shortness Of Breath   Ace Inhibitors Cough   Aspirin     Uncoated Aspirin upsets stomach   Ceclor [Cefaclor] Hives    Has tolerated Ancef in 2022   Sulfa Antibiotics Hives and Other (See Comments)    Lisinopril Cough    Medication list has been reviewed and updated.  Current Outpatient Medications on File Prior to Visit  Medication Sig Dispense Refill   acetaminophen (TYLENOL) 500 MG tablet Take 2 tablets (1,000 mg total) by mouth every 6 (six) hours as needed for moderate pain or mild pain. 30 tablet 0   acyclovir (ZOVIRAX) 400 MG tablet Take 1 tablet (400 mg total) by mouth 3 (three) times daily as needed. (Patient taking differently: Take 400 mg by mouth 3 (three) times daily as needed (out breaks).) 30 tablet 1   Adalimumab 80 MG/0.8ML PNKT Inject 80 mg into the skin every 14 (fourteen) days.     albuterol (VENTOLIN HFA) 108 (90 Base) MCG/ACT inhaler Inhale 2 puffs into the lungs every 6 (six) hours as needed for shortness of breath.     amoxicillin-clavulanate (AUGMENTIN) 875-125 MG tablet Take 1 tablet by mouth every 12 (twelve) hours. 14 tablet 0   aspirin EC 81 MG tablet Take 1 tablet (81 mg total) by mouth 2 (two) times daily. To prevent blood clots for 30 days after surgery. 60 tablet 0   Azelastine HCl 0.15 % SOLN Place 2 sprays into both nostrils 2 (two) times daily.     carvedilol (COREG) 12.5 MG tablet TAKE 1 TABLET (12.5MG  TOTAL) BY MOUTH TWICE A DAY WITH MEALS (Patient taking differently: Take 12.5 mg by mouth 2 (two) times daily with a meal.) 180 tablet 0   cetirizine (ZYRTEC) 10 MG tablet Take 10 mg by mouth daily.     dapagliflozin propanediol (FARXIGA) 10 MG TABS tablet Take 1 tablet (10 mg total) by mouth daily before breakfast. 30 tablet 11   diphenoxylate-atropine (LOMOTIL) 2.5-0.025 MG tablet Take 2 tablets by mouth 4 (four) times daily as needed for diarrhea or loose stools.     docusate sodium (COLACE) 100 MG capsule Take 1 capsule (100 mg total) by mouth 2 (  two) times daily as needed for mild constipation or moderate constipation. 20 capsule 0   EPINEPHrine 0.3 mg/0.3 mL IJ SOAJ injection Inject 0.3 mg into the muscle as needed for anaphylaxis.     Hyoscyamine  Sulfate 0.375 MG TBCR Take 0.375 mg by mouth 2 (two) times daily as needed (stomach cramping).     ipratropium (ATROVENT) 0.06 % nasal spray Place 2 sprays into both nostrils 4 (four) times daily. Use as needed 15 mL 12   lansoprazole (PREVACID SOLUTAB) 15 MG disintegrating tablet Take 15 mg by mouth 2 (two) times daily before a meal.     levothyroxine (SYNTHROID) 75 MCG tablet TAKE 1 TABLET BY MOUTH EVERY DAY BEFORE BREAKFAST (Patient taking differently: Take 75 mcg by mouth daily before breakfast.) 90 tablet 1   MAGNESIUM PO Take 250 mg by mouth daily.     Menthol, Topical Analgesic, (BIOFREEZE COLORLESS) 4 % GEL Apply 1 application topically daily as needed (Hip pain).     mesalamine (LIALDA) 1.2 g EC tablet Take 2.4 g by mouth in the morning and at bedtime.      metFORMIN (GLUCOPHAGE) 500 MG tablet TAKE 1 TABLET BY MOUTH TWICE A DAY WITH FOOD 180 tablet 3   montelukast (SINGULAIR) 10 MG tablet Take 10 mg by mouth at bedtime.      promethazine (PHENERGAN) 12.5 MG tablet Take 1 tablet (12.5 mg total) by mouth every 6 (six) hours as needed for nausea or vomiting. 30 tablet 0   rosuvastatin (CRESTOR) 20 MG tablet TAKE 1 TABLET BY MOUTH EVERY DAY 90 tablet 1   sacubitril-valsartan (ENTRESTO) 49-51 MG Take 1 tablet by mouth 2 (two) times daily. 60 tablet 11   spironolactone (ALDACTONE) 25 MG tablet Take 1 tablet (25 mg total) by mouth daily. 90 tablet 3   SYMBICORT 160-4.5 MCG/ACT inhaler INHALE 2 PUFFS INTO THE LUNGS TWICE A DAY (Patient taking differently: Inhale 2 puffs into the lungs 2 (two) times daily.) 30.6 each 1   triamcinolone (NASACORT) 55 MCG/ACT AERO nasal inhaler Place 2 sprays into the nose daily.     venlafaxine XR (EFFEXOR-XR) 150 MG 24 hr capsule Take 1 capsule (150 mg total) by mouth daily with breakfast. 90 capsule 1   zinc gluconate 50 MG tablet Take 50 mg by mouth daily.     zolpidem (AMBIEN) 5 MG tablet TAKE 1 TABLET BY MOUTH EVERY DAY AT BEDTIME AS NEEDED FOR SLEEP STRENGTH: 5  MG 30 tablet 2   No current facility-administered medications on file prior to visit.    Review of Systems:  As per HPI- otherwise negative.    Physical Examination: Vitals:   01/01/23 0847  BP: 118/60  Pulse: 89  Resp: 18  Temp: 98 F (36.7 C)  SpO2: 96%   Vitals:   01/01/23 0847  Weight: 165 lb 12.8 oz (75.2 kg)  Height: 5\' 3"  (1.6 m)   Body mass index is 29.37 kg/m. Ideal Body Weight: Weight in (lb) to have BMI = 25: 140.8  GEN: no acute distress. Overweight, looks well  HEENT: Atraumatic, Normocephalic. Nasal cavity congestion is present, bilaterally. Bilateral TM wnl, oropharynx normal.  PEERL,EOMI.   Ears and Nose: No external deformity. CV: RRR, No M/G/R. No JVD. No thrill. No extra heart sounds. PULM: CTA B, no wheezes, crackles, rhonchi. No retractions. No resp. distress. No accessory muscle use. ABD: S, NT, ND, +BS. No rebound. No HSM. EXTR: No c/c/e PSYCH: Normally interactive. Conversant.   Results for orders placed or  performed in visit on 01/01/23  POC COVID-19 BinaxNow  Result Value Ref Range   SARS Coronavirus 2 Ag Negative Negative    Assessment and Plan: Nasal congestion - Plan: POC COVID-19 BinaxNow  Controlled type 2 diabetes mellitus without complication, without long-term current use of insulin (HCC) - Plan: Hemoglobin A1c  Mixed hyperlipidemia  Chronic maxillary sinusitis - Plan: predniSONE (DELTASONE) 20 MG tablet, amoxicillin-clavulanate (AUGMENTIN) 875-125 MG tablet  Patient seen today for recurrent nasal congestion and likely sinusitis.  Will treat with prednisone 40 for 5 days, Augmentin.  Patient notes she can tolerate Augmentin with no issues She has been getting recurrent sinusitis infections, we hope this will resolve once she has her planned sinus surgery Will also follow-up on her diabetes with an A1c today Consistent with Crestor 20 mg for  hyperlipidemia  Signed Abbe Amsterdam, MD

## 2023-01-01 ENCOUNTER — Encounter: Payer: Self-pay | Admitting: Family Medicine

## 2023-01-01 ENCOUNTER — Other Ambulatory Visit (HOSPITAL_COMMUNITY): Payer: Self-pay | Admitting: Cardiology

## 2023-01-01 ENCOUNTER — Ambulatory Visit: Payer: BC Managed Care – PPO | Admitting: Family Medicine

## 2023-01-01 ENCOUNTER — Telehealth: Payer: Self-pay | Admitting: Family Medicine

## 2023-01-01 VITALS — BP 118/60 | HR 89 | Temp 98.0°F | Resp 18 | Ht 63.0 in | Wt 165.8 lb

## 2023-01-01 DIAGNOSIS — Z7984 Long term (current) use of oral hypoglycemic drugs: Secondary | ICD-10-CM

## 2023-01-01 DIAGNOSIS — E782 Mixed hyperlipidemia: Secondary | ICD-10-CM | POA: Diagnosis not present

## 2023-01-01 DIAGNOSIS — E119 Type 2 diabetes mellitus without complications: Secondary | ICD-10-CM | POA: Diagnosis not present

## 2023-01-01 DIAGNOSIS — R0981 Nasal congestion: Secondary | ICD-10-CM | POA: Diagnosis not present

## 2023-01-01 DIAGNOSIS — J320 Chronic maxillary sinusitis: Secondary | ICD-10-CM

## 2023-01-01 LAB — HEMOGLOBIN A1C: Hgb A1c MFr Bld: 6.9 % — ABNORMAL HIGH (ref 4.6–6.5)

## 2023-01-01 LAB — POC COVID19 BINAXNOW: SARS Coronavirus 2 Ag: NEGATIVE

## 2023-01-01 MED ORDER — PREDNISONE 20 MG PO TABS
ORAL_TABLET | ORAL | 0 refills | Status: DC
Start: 2023-01-01 — End: 2023-01-31

## 2023-01-01 MED ORDER — AMOXICILLIN-POT CLAVULANATE 875-125 MG PO TABS
1.0000 | ORAL_TABLET | Freq: Two times a day (BID) | ORAL | 0 refills | Status: DC
Start: 2023-01-01 — End: 2023-01-31

## 2023-01-01 NOTE — Patient Instructions (Signed)
Good to see you today- I will be in touch with your A1c Please use the prednisone and augmentin as directed  Let me know if you are not feeing better as you normally are!

## 2023-01-01 NOTE — Telephone Encounter (Signed)
Pt stopped by checkout and said that her pharmacy is not showing that they received the prednisone. Advised they both show received by pharmacy. Patient would like for [place] to resend.

## 2023-01-01 NOTE — Telephone Encounter (Signed)
Called [facility] and pharmacy verified that the pts Rx's are ready for pickup. Pt aware and voices understanding.

## 2023-01-02 ENCOUNTER — Other Ambulatory Visit (HOSPITAL_COMMUNITY): Payer: Self-pay

## 2023-01-02 MED ORDER — DAPAGLIFLOZIN PROPANEDIOL 10 MG PO TABS: 10.0000 mg | ORAL_TABLET | Freq: Every day | ORAL | 11 refills | Status: DC

## 2023-01-10 ENCOUNTER — Other Ambulatory Visit (HOSPITAL_COMMUNITY): Payer: Self-pay | Admitting: Cardiology

## 2023-01-16 ENCOUNTER — Encounter: Payer: Self-pay | Admitting: Family

## 2023-01-23 ENCOUNTER — Ambulatory Visit
Admission: RE | Admit: 2023-01-23 | Discharge: 2023-01-23 | Disposition: A | Discharge status: Home / Self Care | Payer: BC Managed Care – PPO | Source: Ambulatory Visit | Attending: Nurse Practitioner

## 2023-01-23 DIAGNOSIS — Z170 Estrogen receptor positive status [ER+]: Secondary | ICD-10-CM

## 2023-01-25 ENCOUNTER — Other Ambulatory Visit (HOSPITAL_COMMUNITY): Payer: Self-pay | Admitting: Cardiology

## 2023-01-28 NOTE — Progress Notes (Unsigned)
[facility] [address], [address] [place], [place] [address] [phone]  Date:  01/31/2023   Name:  Erin Fields   DOB:  10/15/1965   MRN:  [id number]  PCP:  Pearline Cables, MD    Chief Complaint: URI (Congestion: since January. Scheduled for Sinus surgery )   History of Present Illness:  Erin Fields is a 57 y.o. very pleasant female patient who presents with the following:  Patient seen today with concern of congestion/recurrent sinusitis  Last visit with myself was in May - history significant for right-sided breast cancer, type 2 diabetes, ulcerative colitis on Humira ,nonobstructive coronary artery disease, hypertension, hyperlipidemia, previous history of cardiomyopathy and ICD placement with right ventricular perforation, bilateral hip replacement  Followed by oncology at [facility]  She is a GI pt at [facility]   Most recent visit with myself was about 1 month ago-at that time she also had concern of sinus congestion-she plans to have sinus surgery with Dr. Suszanne Conners next month We treated her last month with prednisone for 5 days and Augmentin- this worked, but then her sx came back  At this time she notes her throat is sore in the am, and she is coughing up clear mucus- she feels like post nasal drainage  Her sx worsened again about a week ago, 2-3 weeks after she finished her steroids She is having sinus surgery 02/16/23  No fever, no chills or aches She feels a bit fatigued but this is not new  DM is under good control   Flu vaccine- done  COVID booster- done  Lab Results  Component Value Date   HGBA1C 6.9 (H) 01/01/2023    Patient Active Problem List   Diagnosis Date Noted   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   DJD (degenerative joint disease) 12/20/2021   S/P total right hip arthroplasty 12/20/2021   Diabetes mellitus with insulin therapy (HCC) 10/24/2021   Lymphedema of breast  10/24/2021   Breast cancer (HCC) 05/11/2021   Hemothorax on left 10/02/2020   S/P evacuation of hematoma 10/02/2020   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 10/02/2020   Hypovolemic shock (HCC) 10/02/2020   Anxiety    Coronary artery disease 25% of RCA, 20% intermediate branch, 40% diagonal branch based on cardiac cath from 2022 07/22/2020   Dilated cardiomyopathy (HCC) ejection fraction 25% by echocardiogram from January 2022 04/29/2020   Congestive heart failure (HCC) 04/29/2020   Allergic rhinitis due to animal (cat) (dog) hair and dander 04/27/2020   Allergic rhinitis due to pollen 04/27/2020   Mild persistent asthma, uncomplicated 04/27/2020   PONV (postoperative nausea and vomiting)    Migraines    GERD (gastroesophageal reflux disease)    Environmental allergies    Diabetes mellitus without complication (HCC)    Depression    Colon polyps    B12 deficiency 11/27/2019   IDA (iron deficiency anemia) 11/06/2019   Recurrent infections 10/05/2018   Anxiety and depression 01/20/2018   Right upper lobe pulmonary nodule 01/10/2018   Nasal polyp, unspecified 01/10/2018   Left sided abdominal pain 12/13/2017   Impaired fasting glucose 12/12/2017   History of adenomatous polyp of colon 11/19/2017   Genetic testing 07/16/2017   Family history of breast cancer    Controlled type 2 diabetes mellitus without complication, without long-term current use of insulin (HCC) 05/28/2017   Personal history of radiation therapy 2019   Cancer (HCC)  2019   Malignant neoplasm of upper-outer quadrant of right breast in female, 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   Crohn disease (HCC) 11/29/2016   Borderline diabetes 12/17/2015   Ex-smoker 11/18/2015   Chronic venous insufficiency 09/02/2015   Symptomatic varicose veins, right 09/02/2015   Chronic sinusitis 01/26/2015   Eustachian tube dysfunction 12/04/2014   Other allergic rhinitis 09/15/2014   Dyspnea 09/15/2014   Liver  hemangioma 08/24/2014   Elevated triglycerides with high cholesterol 11/26/2013   Insomnia 08/20/2013   Hypertension 08/20/2013   Ulcerative colitis (HCC) 04/20/2013   Oral herpes 04/20/2013   Hyperlipidemia 12/06/2006   HEADACHE 12/06/2006   CERVICAL CANCER, HX OF 12/06/2006   Hypothyroidism 11/29/2006   Asthma 11/29/2006   IRRITABLE BOWEL SYNDROME 11/29/2006    Past Medical History:  Diagnosis Date   Allergic rhinitis due to animal (cat) (dog) hair and dander 04/27/2020   Allergic rhinitis due to pollen 04/27/2020   Anxiety    Anxiety and depression 01/20/2018   Arthritis    Asthma    Asthma, persistent controlled 10/05/2018   B12 deficiency 11/27/2019   Borderline diabetes 12/17/2015   Breast cancer (HCC)    Cancer (HCC) 2019   right breast cancer   CERVICAL CANCER, HX OF 12/06/2006   Qualifier: Diagnosis of  By: Lovell Sheehan MD, John E    Chronic sinusitis 01/26/2015   Chronic venous insufficiency 09/02/2015   Colon polyps    Congestive heart failure (HCC) 04/29/2020   Controlled type 2 diabetes mellitus without complication, without long-term current use of insulin (HCC) 05/28/2017   Coronary artery disease 25% of RCA, 20% intermediate branch, 40% diagonal branch based on cardiac cath from 2022 07/22/2020   Crohn disease (HCC) 11/29/2016   Depression    Diabetes mellitus without complication (HCC)    type 2   Dilated cardiomyopathy (HCC) ejection fraction 25% by echocardiogram from January 2022 04/29/2020   Dyspnea 09/15/2014   Elevated triglycerides with high cholesterol 11/26/2013   Environmental allergies    Eustachian tube dysfunction 12/04/2014   Ex-smoker 11/18/2015   Family history of breast cancer    Genetic testing 07/16/2017   Negative genetic testing on the multi-cancer panel.  The Multi-Gene Panel offered by [name] includes sequencing and/or deletion duplication testing of the following 83 genes: ALK, APC, ATM, AXIN2,BAP1,  BARD1, BLM, BMPR1A, BRCA1,  BRCA2, BRIP1, CASR, CDC73, CDH1, CDK4, CDKN1B, CDKN1C, CDKN2A (p14ARF), CDKN2A (p16INK4a), CEBPA, CHEK2, CTNNA1, DICER1, DIS3L2, EGFR (c.2369C>T, p.Thr790Met variant onl   GERD (gastroesophageal reflux disease)    HEADACHE 12/06/2006   Qualifier: Diagnosis of  By: Lovell Sheehan MD, John E    Hemothorax on left 10/02/2020   History of adenomatous polyp of colon 11/19/2017   Hyperlipidemia    Hypertension    Hypothyroidism    IDA (iron deficiency anemia) 11/06/2019   Impaired fasting glucose 12/12/2017   Insomnia 08/20/2013   Irritable bowel syndrome 11/29/2006   Qualifier: Diagnosis of  By: Lovell Sheehan MD, John E    Left sided abdominal pain 12/13/2017   Liver hemangioma 08/24/2014   Malignant neoplasm of upper-outer quadrant of right breast in female, 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   Migraines    Mild persistent asthma, uncomplicated 04/27/2020   Nasal polyp, unspecified 01/10/2018   Oral herpes 04/20/2013   Other allergic rhinitis 09/15/2014   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 10/02/2020   Personal history of radiation therapy 2019   36 radiation tx   PONV (postoperative nausea and vomiting)    scopolamine  patch helps   Recurrent infections 10/05/2018   Right upper lobe pulmonary nodule 01/10/2018   Symptomatic varicose veins, right 09/02/2015   Ulcerative colitis (HCC)    Varicose veins     Past Surgical History:  Procedure Laterality Date   BREAST BIOPSY Right 02/2017   BREAST BIOPSY Right 11/07/2019   BREAST LUMPECTOMY Right 04/24/2017   BREAST LUMPECTOMY WITH RADIOACTIVE SEED AND SENTINEL LYMPH NODE BIOPSY Right 04/24/2017   Procedure: RIGHT BREAST LUMPECTOMY WITH RADIOACTIVE SEED AND SENTINEL LYMPH NODE BIOPSY;  Surgeon: Almond Lint, MD;  Location: [facility];  Service: General;  Laterality: Right;   CORONARY ARTERY BYPASS GRAFT     crapal tunnel relase Left 05/2018   ENDOMETRIAL ABLATION  2010   Had abnormal uterine bleeding  (heavy and frequent), performed in [place], menses stopped completely 2 years after   EXPLORATORY LAPAROTOMY     Had exploratory surgery at the age of 71 for abdominal pain with cyst found on her left ovary, not treated surgically   FOOT SURGERY Right 2012   GENERATOR REMOVAL  10/02/2020   Procedure: GENERATOR REMOVAL;  Surgeon: Purcell Nails, MD;  Location: [facility];  Service: Thoracic;;   HAND SURGERY Right    ICD IMPLANT N/A 10/01/2020   Procedure: ICD IMPLANT;  Surgeon: Regan Lemming, MD;  Location: [facility];  Service: Cardiovascular;  Laterality: N/A;   ICD LEAD REMOVAL N/A 10/02/2020   Procedure: ICD LEAD REMOVAL;  Surgeon: Purcell Nails, MD;  Location: [facility] OR;  Service: Thoracic;  Laterality: N/A;   MEDIASTERNOTOMY N/A 10/02/2020   Procedure: MEDIAN STERNOTOMY REPAIR PERFORATED VENTRICLE EVACUATION LEFT HEMOTHORAX;  Surgeon: Purcell Nails, MD;  Location: [facility] OR;  Service: Thoracic;  Laterality: N/A;   RE-EXCISION OF BREAST LUMPECTOMY Right 05/15/2017   Procedure: RE-EXCISION OF BREAST LUMPECTOMY;  Surgeon: Almond Lint, MD;  Location: [facility];  Service: General;  Laterality: Right;   RIGHT/LEFT HEART CATH AND CORONARY ANGIOGRAPHY N/A 06/21/2020   Procedure: RIGHT/LEFT HEART CATH AND CORONARY ANGIOGRAPHY;  Surgeon: Yvonne Kendall, MD;  Location: [facility];  Service: Cardiovascular;  Laterality: N/A;   ROBOTIC ASSISTED SALPINGO OOPHERECTOMY Bilateral 08/29/2019   Procedure: XI ROBOTIC ASSISTED SALPINGO OOPHORECTOMY;  Surgeon: [name], MD;  [facility];  Service: Gynecology;  Laterality: Bilateral;   Septoplasty with turbinate reduction     TEE WITHOUT CARDIOVERSION N/A 10/02/2020   Procedure: TRANSESOPHAGEAL ECHOCARDIOGRAM (TEE);  Surgeon: Purcell Nails, MD;  Location: [facility] OR;  Service: Thoracic;  Laterality: N/A;   TONSILLECTOMY  1980   TOTAL HIP ARTHROPLASTY Right 12/20/2021   Procedure: RIGHT  TOTAL HIP ARTHROPLASTY ANTERIOR APPROACH;  Surgeon: Sheral Apley, MD;  Location: [facility] OR;  Service: Orthopedics;  Laterality: Right;   TOTAL HIP ARTHROPLASTY Left 05/23/2022   Procedure: TOTAL HIP ARTHROPLASTY ANTERIOR APPROACH;  Surgeon: Sheral Apley, MD;  Location: [facility] OR;  Service: Orthopedics;  Laterality: Left;   VEIN SURGERY     Removal Right Leg   WISDOM TOOTH EXTRACTION      Social History   Tobacco Use   Smoking status: Former    Current packs/day: 0.00    Average packs/day: 3.0 packs/day for 15.0 years (45.0 ttl pk-yrs)    Types: Cigarettes    Start date: 09/15/1978    Quit date: 09/14/1993    Years since quitting: 29.4   Smokeless tobacco: Never   Tobacco comments:    Quit 26 yrs ago  Vaping Use   Vaping status: Never Used  Substance Use Topics   Alcohol use: Yes    Alcohol/week: 1.0 - 2.0 standard drink of alcohol    Types: 1 - 2 Glasses of wine per week    Comment: occ   Drug use: No    Family History  Problem Relation Age of Onset   Arthritis Mother 43       Deceased   Breast cancer Mother    Lung cancer Mother    Hyperlipidemia Mother    Heart disease Mother    Hypertension Mother    Diabetes Mother    Crohn's disease Mother    Diabetes Maternal Grandfather    Heart disease Maternal Grandfather    Heart attack Maternal Grandfather    Leukemia Maternal Grandfather    Colitis Sister    Leukemia Cousin 3       mat cousin   Lung cancer Father    Brain cancer Father     Allergies  Allergen Reactions   Losartan Potassium Shortness Of Breath   Ace Inhibitors Cough   Aspirin     Uncoated Aspirin upsets stomach   Ceclor [Cefaclor] Hives    Has tolerated Ancef in 2022   Sulfa Antibiotics Hives and Other (See Comments)   Lisinopril Cough    Medication list has been reviewed and updated.  Current Outpatient Medications on File Prior to Visit  Medication Sig Dispense Refill   acetaminophen (TYLENOL) 500 MG tablet Take 2 tablets (1,000 mg  total) by mouth every 6 (six) hours as needed for moderate pain or mild pain. 30 tablet 0   acyclovir (ZOVIRAX) 400 MG tablet Take 1 tablet (400 mg total) by mouth 3 (three) times daily as needed. (Patient taking differently: Take 400 mg by mouth 3 (three) times daily as needed (out breaks).) 30 tablet 1   Adalimumab 80 MG/0.8ML PNKT Inject 80 mg into the skin every 14 (fourteen) days.     albuterol (VENTOLIN HFA) 108 (90 Base) MCG/ACT inhaler Inhale 2 puffs into the lungs every 6 (six) hours as needed for shortness of breath.     amoxicillin-clavulanate (AUGMENTIN) 875-125 MG tablet Take 1 tablet by mouth every 12 (twelve) hours. 14 tablet 0   aspirin EC 81 MG tablet Take 1 tablet (81 mg total) by mouth 2 (two) times daily. To prevent blood clots for 30 days after surgery. 60 tablet 0   Azelastine HCl 0.15 % SOLN Place 2 sprays into both nostrils 2 (two) times daily.     carvedilol (COREG) 12.5 MG tablet TAKE 1 TABLET (12.5MG  TOTAL) BY MOUTH TWICE A DAY WITH MEALS 180 tablet 0   cetirizine (ZYRTEC) 10 MG tablet Take 10 mg by mouth daily.     dapagliflozin propanediol (FARXIGA) 10 MG TABS tablet Take 1 tablet (10 mg total) by mouth daily before breakfast. 30 tablet 11   diphenoxylate-atropine (LOMOTIL) 2.5-0.025 MG tablet Take 2 tablets by mouth 4 (four) times daily as needed for diarrhea or loose stools.     docusate sodium (COLACE) 100 MG capsule Take 1 capsule (100 mg total) by mouth 2 (two) times daily as needed for mild constipation or moderate constipation. 20 capsule 0   EPINEPHrine 0.3 mg/0.3 mL IJ SOAJ injection Inject 0.3 mg into the muscle as needed for anaphylaxis.     FARXIGA 10 MG TABS tablet TAKE 1 TABLET BY MOUTH DAILY BEFORE BREAKFAST. 30 tablet 11   Hyoscyamine Sulfate 0.375 MG TBCR Take 0.375 mg by  mouth 2 (two) times daily as needed (stomach cramping).     ipratropium (ATROVENT) 0.06 % nasal spray Place 2 sprays into both nostrils 4 (four) times daily. Use as needed 15 mL 12    lansoprazole (PREVACID SOLUTAB) 15 MG disintegrating tablet Take 15 mg by mouth 2 (two) times daily before a meal.     levothyroxine (SYNTHROID) 75 MCG tablet TAKE 1 TABLET BY MOUTH EVERY DAY BEFORE BREAKFAST (Patient taking differently: Take 75 mcg by mouth daily before breakfast.) 90 tablet 1   MAGNESIUM PO Take 250 mg by mouth daily.     Menthol, Topical Analgesic, (BIOFREEZE COLORLESS) 4 % GEL Apply 1 application topically daily as needed (Hip pain).     mesalamine (LIALDA) 1.2 g EC tablet Take 2.4 g by mouth in the morning and at bedtime.      metFORMIN (GLUCOPHAGE) 500 MG tablet TAKE 1 TABLET BY MOUTH TWICE A DAY WITH FOOD 180 tablet 3   montelukast (SINGULAIR) 10 MG tablet Take 10 mg by mouth at bedtime.      promethazine (PHENERGAN) 12.5 MG tablet Take 1 tablet (12.5 mg total) by mouth every 6 (six) hours as needed for nausea or vomiting. 30 tablet 0   rosuvastatin (CRESTOR) 20 MG tablet TAKE 1 TABLET BY MOUTH EVERY DAY 90 tablet 1   sacubitril-valsartan (ENTRESTO) 49-51 MG Take 1 tablet by mouth 2 (two) times daily. 60 tablet 11   spironolactone (ALDACTONE) 25 MG tablet TAKE 1 TABLET (25 MG TOTAL) BY MOUTH DAILY. 90 tablet 3   SYMBICORT 160-4.5 MCG/ACT inhaler INHALE 2 PUFFS INTO THE LUNGS TWICE A DAY (Patient taking differently: Inhale 2 puffs into the lungs 2 (two) times daily.) 30.6 each 1   triamcinolone (NASACORT) 55 MCG/ACT AERO nasal inhaler Place 2 sprays into the nose daily.     venlafaxine XR (EFFEXOR-XR) 150 MG 24 hr capsule Take 1 capsule (150 mg total) by mouth daily with breakfast. 90 capsule 1   zinc gluconate 50 MG tablet Take 50 mg by mouth daily.     zolpidem (AMBIEN) 5 MG tablet TAKE 1 TABLET BY MOUTH EVERY DAY AT BEDTIME AS NEEDED FOR SLEEP STRENGTH: 5 MG 30 tablet 2   No current facility-administered medications on file prior to visit.    Review of Systems:  As per HPI- otherwise negative.   Physical Examination: Vitals:   01/31/23 1548  BP: 124/74  Pulse:  85  Resp: 18  Temp: 98 F (36.7 C)  SpO2: 97%   Vitals:   01/31/23 1548  Weight: 170 lb 6.4 oz (77.3 kg)   Body mass index is 30.19 kg/m. Ideal Body Weight:    GEN: no acute distress.  Mild obesity, looks well  HEENT: Atraumatic, Normocephalic. Bilateral TM wnl, oropharynx normal.  PEERL,EOMI. nasal cavity is inflamed with increased mucus, especially the left Ears and Nose: No external deformity. CV: RRR, No M/G/R. No JVD. No thrill. No extra heart sounds. PULM: CTA B, no wheezes, crackles, rhonchi. No retractions. No resp. distress. No accessory muscle use. EXTR: No c/c/e PSYCH: Normally interactive. Conversant.    Assessment and Plan: Mild intermittent asthma with exacerbation - Plan: predniSONE (DELTASONE) 20 MG tablet  Non-seasonal allergic rhinitis, unspecified trigger - Plan: predniSONE (DELTASONE) 20 MG tablet  Chronic maxillary sinusitis - Plan: amoxicillin-clavulanate (AUGMENTIN) 875-125 MG tablet  Patient seen today with recurrent sinusitis symptoms.  Of note, she is going to undergo sinus surgery in hopes of fixing this problem in a few weeks.  We  have treated her previously with prednisone and Augmentin which helps her.  Will refill this regimen today I have asked her let me know if not improving and getting back to her normal baseline Signed Abbe Amsterdam, MD

## 2023-01-31 ENCOUNTER — Ambulatory Visit: Payer: BC Managed Care – PPO | Admitting: Family Medicine

## 2023-01-31 ENCOUNTER — Encounter: Payer: Self-pay | Admitting: Family

## 2023-01-31 DIAGNOSIS — J3089 Other allergic rhinitis: Secondary | ICD-10-CM

## 2023-01-31 DIAGNOSIS — J4521 Mild intermittent asthma with (acute) exacerbation: Secondary | ICD-10-CM

## 2023-01-31 DIAGNOSIS — J320 Chronic maxillary sinusitis: Secondary | ICD-10-CM

## 2023-01-31 MED ORDER — AMOXICILLIN-POT CLAVULANATE 875-125 MG PO TABS
1.0000 | ORAL_TABLET | Freq: Two times a day (BID) | ORAL | 0 refills | Status: DC
Start: 2023-01-31 — End: 2023-02-09

## 2023-01-31 MED ORDER — PREDNISONE 20 MG PO TABS: 40.0000 mg | ORAL_TABLET | Freq: Every day | ORAL | 0 refills | Status: AC

## 2023-01-31 NOTE — Patient Instructions (Signed)
Good to see you today- please let me know if your symptoms do not improve as they normally do with steroids and antibiotics

## 2023-02-05 ENCOUNTER — Ambulatory Visit (HOSPITAL_COMMUNITY)
Admission: RE | Admit: 2023-02-05 | Discharge: 2023-02-05 | Disposition: A | Discharge status: Home / Self Care | Payer: BC Managed Care – PPO | Source: Ambulatory Visit | Attending: Cardiology | Admitting: Cardiology

## 2023-02-05 ENCOUNTER — Ambulatory Visit: Payer: No Typology Code available for payment source | Admitting: Family Medicine

## 2023-02-05 ENCOUNTER — Ambulatory Visit (HOSPITAL_BASED_OUTPATIENT_CLINIC_OR_DEPARTMENT_OTHER)
Admission: RE | Admit: 2023-02-05 | Discharge: 2023-02-05 | Disposition: A | Discharge status: Home / Self Care | Payer: BC Managed Care – PPO | Source: Ambulatory Visit | Attending: Cardiology | Admitting: Cardiology

## 2023-02-05 ENCOUNTER — Encounter (HOSPITAL_COMMUNITY): Payer: Self-pay | Admitting: Cardiology

## 2023-02-05 VITALS — BP 120/70 | HR 81 | Wt 167.4 lb

## 2023-02-05 DIAGNOSIS — I110 Hypertensive heart disease with heart failure: Secondary | ICD-10-CM | POA: Diagnosis present

## 2023-02-05 DIAGNOSIS — K5190 Ulcerative colitis, unspecified, without complications: Secondary | ICD-10-CM | POA: Diagnosis not present

## 2023-02-05 DIAGNOSIS — Z95810 Presence of automatic (implantable) cardiac defibrillator: Secondary | ICD-10-CM | POA: Diagnosis not present

## 2023-02-05 DIAGNOSIS — I428 Other cardiomyopathies: Secondary | ICD-10-CM | POA: Insufficient documentation

## 2023-02-05 DIAGNOSIS — E785 Hyperlipidemia, unspecified: Secondary | ICD-10-CM | POA: Insufficient documentation

## 2023-02-05 DIAGNOSIS — I5022 Chronic systolic (congestive) heart failure: Secondary | ICD-10-CM | POA: Insufficient documentation

## 2023-02-05 DIAGNOSIS — Z923 Personal history of irradiation: Secondary | ICD-10-CM | POA: Insufficient documentation

## 2023-02-05 DIAGNOSIS — Z79899 Other long term (current) drug therapy: Secondary | ICD-10-CM | POA: Insufficient documentation

## 2023-02-05 DIAGNOSIS — E119 Type 2 diabetes mellitus without complications: Secondary | ICD-10-CM | POA: Insufficient documentation

## 2023-02-05 DIAGNOSIS — C50919 Malignant neoplasm of unspecified site of unspecified female breast: Secondary | ICD-10-CM | POA: Diagnosis not present

## 2023-02-05 LAB — LIPID PANEL
Cholesterol: 162 mg/dL (ref 0–200)
HDL: 41 mg/dL (ref >40)
LDL Cholesterol: 61 mg/dL (ref 0–99)
Total CHOL/HDL Ratio: 4 {ratio}
Triglycerides: 302 mg/dL — ABNORMAL HIGH (ref <150)
VLDL: 60 mg/dL — ABNORMAL HIGH (ref 0–40)

## 2023-02-05 LAB — ECHOCARDIOGRAM COMPLETE
Area-P 1/2: 3.4 cm2
Calc EF: 56.2 %
Est EF: 45
S' Lateral: 3.4 cm
Single Plane A2C EF: 52.5 %
Single Plane A4C EF: 56.9 %

## 2023-02-05 LAB — BASIC METABOLIC PANEL
Anion gap: 11 (ref 5–15)
BUN: 11 mg/dL (ref 6–20)
CO2: 23 mmol/L (ref 22–32)
Calcium: 9.6 mg/dL (ref 8.9–10.3)
Chloride: 104 mmol/L (ref 98–111)
Creatinine, Ser: 0.63 mg/dL (ref 0.44–1.00)
GFR, Estimated: >60 mL/min (ref >60)
Glucose, Bld: 123 mg/dL — ABNORMAL HIGH (ref 70–99)
Potassium: 3.6 mmol/L (ref 3.5–5.1)
Sodium: 138 mmol/L (ref 135–145)

## 2023-02-05 LAB — BRAIN NATRIURETIC PEPTIDE: B Natriuretic Peptide: 19.3 pg/mL (ref 0.0–100.0)

## 2023-02-05 MED ORDER — ENTRESTO 97-103 MG PO TABS: 1.0000 | ORAL_TABLET | Freq: Two times a day (BID) | ORAL | 11 refills | Status: DC

## 2023-02-05 MED ORDER — PERFLUTREN LIPID MICROSPHERE
1.0000 mL | INTRAVENOUS | Status: DC | PRN
  Administered 2023-02-05: 2 mL via INTRAVENOUS
  Filled 2023-02-05: qty 10

## 2023-02-05 NOTE — Patient Instructions (Signed)
INCREASE Entresto to 97/103 mg Twice daily  Labs done today, your results will be available in MyChart, we will contact you for abnormal readings.  Repeat blood work in 3 weeks.  Your physician recommends that you schedule a follow-up appointment in: 4 months  If you have any questions or concerns before your next appointment please send [place] a message through [place] or call our office at [phone].    TO LEAVE A MESSAGE FOR THE NURSE SELECT OPTION 2, PLEASE LEAVE A MESSAGE INCLUDING: YOUR NAME DATE OF BIRTH CALL BACK NUMBER REASON FOR CALL**this is important as we prioritize the call backs  YOU WILL RECEIVE A CALL BACK THE SAME DAY AS LONG AS YOU CALL BEFORE 4:00 PM  At the [facility], you and your health needs are our priority. As part of our continuing mission to provide you with exceptional heart care, we have created designated Provider Care Teams. These Care Teams include your primary Cardiologist (physician) and Advanced Practice Providers (APPs- Physician Assistants and Nurse Practitioners) who all work together to provide you with the care you need, when you need it.   You may see any of the following providers on your designated Care Team at your next follow up: Dr Arvilla Meres Dr Marca Ancona Dr. Dorthula Nettles Dr. Clearnce Hasten Amy Filbert Schilder, NP Robbie Lis, [place] [facility] [place], [place] Brynda Peon, NP [place] Lee, NP Karle Plumber, PharmD   Please be sure to bring in all your medications bottles to every appointment.    Thank you for choosing [facility]

## 2023-02-05 NOTE — Progress Notes (Addendum)
PCP: Dr. Patsy Lager Cardiology: Dr. Bing Matter HF Cardiology: Dr. Shirlee Latch  57 y.o. with history of ulcerative colitis, HTN, and breast cancer was referred by Dr. Bing Matter for evaluation of CHF.   Patient's breast cancer was treated with surgery and radiation, no chemo.  Patient reports exertional dyspnea since 2019.  Patient has right hip OA, needs THR.  Echo was done in 1/22 as part of pre-op workup, this showed low EF 25%.  She then had RHC/LHC in 3/22 with mild nonobstructive CAD and low cardiac index 2.1.  Echo in 4/22 showed EF still 20-25%.  She was referred for ICD.  St Jude ICD was placed in 6/22 but complicated by RV perforation with hemothorax and hemorrhagic shock.  ICD was removed and RV was surgically repaired with sternotomy.  Patient's mother had CHF in her 80s, uncertain etiology.   Cardiac MRI in 9/22 showed EF 35% with moderate LV hypokinesis, RV EF 44%, small area of LGE at the true apex (?small embolic apical infarct).    Echo in 2/23 showed EF 40-45%, diffuse hypokinesis, mildly decreased RV systolic function.  Echo was done today and reviewed, EF 45% with septal bounce consistent with prior sternotomy, mild RV dysfunction, normal RV.   Patient returns for followup of CHF.  No dyspnea walking on flat ground.  She is short of breath walking up hills or inclines.  No chest pain.  No orthopnea/PND.  No lightheadedness or palpitations. Weight is up 8 lbs.  She has sinus congestion and needs a sinus operation.  She continues on Humira for ulcerative colitis.   ECG (personally reviewed): NSR, right-ward axis, nonspecific T wave flattening  Labs (7/22): K 4.2, creatinine 0.58 Labs (8/22): K 4.3, creatinine 0.52, BNP 30 Labs (10/22): K 4.2, creatinine 0.76 Labs (1/23): K 5.1, creatinine 0.71 Labs (8/23): K 4.1, creatinine 0.8 Labs (2/24): K 4.5, creatinine 0.67 Labs (7/24): K 4.5, creatinine 0.76  PMH: 1.  Cervical cancer 2.  B12 deficiency 3.  Asthma 4.  Type 2 diabetes 5.   Ulcerative colitis 6.  HTN 7.  Hypothyroidism 8.  Irritable bowel syndrome 9.  Breast cancer: Right-sided, surgery and radiation but no chemotherapy.  10.  Chronic systolic CHF: Nonischemic cardiomyopathy.  - Echo (1/22): EF 25% - LHC/RHC (3/22): Mild nonobstructive CAD; mean RA 3, PA 24/11, CI 2.1.  - Echo (4/22): EF 20-25%, mild LV dilation, normal RV.  - Echo (6/22): EF 30-35%  - St Jude ICD placed 6/22, complicated by RV perforation requiring sternotomy due to hemothorax/hemorrhagic shock, removal of ICD and surgical repair of RV perforation.  - Cardiac MRI (9/22): EF 35% with moderate LV hypokinesis, RV EF 44%, small area of LGE at the true apex (?small embolic apical infarct).   - Echo (2/23): EF 40-45%, diffuse hypokinesis, mildly decreased RV systolic function. - Echo (12/23): EF 60-65%, mild RV dysfunction.  - Echo (10/24): EF 45% with septal bounce consistent with prior sternotomy, mild RV dysfunction, normal RV.  11.  OA: s/p right and left hip THR 12.  COVID-19 12/21  FH: Grandfather with MI, mother with CHF in her 39s.    SH: Lives in [place], nonsmoker, rare EtoH.   ROS: All systems reviewed and negative except as per HPI.  Current Outpatient Medications  Medication Sig Dispense Refill   acetaminophen (TYLENOL) 500 MG tablet Take 2 tablets (1,000 mg total) by mouth every 6 (six) hours as needed for moderate pain or mild pain. 30 tablet 0   acyclovir (ZOVIRAX) 400 MG  tablet Take 1 tablet (400 mg total) by mouth 3 (three) times daily as needed. 30 tablet 1   Adalimumab 80 MG/0.8ML PNKT Inject 80 mg into the skin every 14 (fourteen) days.     albuterol (VENTOLIN HFA) 108 (90 Base) MCG/ACT inhaler Inhale 2 puffs into the lungs every 6 (six) hours as needed for shortness of breath.     amoxicillin-clavulanate (AUGMENTIN) 875-125 MG tablet Take 1 tablet by mouth every 12 (twelve) hours. 14 tablet 0   aspirin EC 81 MG tablet Take 1 tablet (81 mg total) by mouth 2 (two) times  daily. To prevent blood clots for 30 days after surgery. 60 tablet 0   Azelastine HCl 0.15 % SOLN Place 2 sprays into both nostrils 2 (two) times daily.     Calcium Carb-Cholecalciferol (CALCIUM 600 + D PO) Take 1 tablet by mouth daily.     carvedilol (COREG) 12.5 MG tablet TAKE 1 TABLET (12.5MG  TOTAL) BY MOUTH TWICE A DAY WITH MEALS 180 tablet 0   cetirizine (ZYRTEC) 10 MG tablet Take 10 mg by mouth daily.     dapagliflozin propanediol (FARXIGA) 10 MG TABS tablet Take 1 tablet (10 mg total) by mouth daily before breakfast. 30 tablet 11   diphenoxylate-atropine (LOMOTIL) 2.5-0.025 MG tablet Take 2 tablets by mouth 4 (four) times daily as needed for diarrhea or loose stools.     docusate sodium (COLACE) 100 MG capsule Take 1 capsule (100 mg total) by mouth 2 (two) times daily as needed for mild constipation or moderate constipation. 20 capsule 0   EPINEPHrine 0.3 mg/0.3 mL IJ SOAJ injection Inject 0.3 mg into the muscle as needed for anaphylaxis.     FARXIGA 10 MG TABS tablet TAKE 1 TABLET BY MOUTH DAILY BEFORE BREAKFAST. 30 tablet 11   Hyoscyamine Sulfate 0.375 MG TBCR Take 0.375 mg by mouth 2 (two) times daily as needed (stomach cramping).     ipratropium (ATROVENT) 0.06 % nasal spray Place 2 sprays into both nostrils 4 (four) times daily. Use as needed 15 mL 12   lansoprazole (PREVACID SOLUTAB) 15 MG disintegrating tablet Take 15 mg by mouth 2 (two) times daily before a meal.     levothyroxine (SYNTHROID) 75 MCG tablet TAKE 1 TABLET BY MOUTH EVERY DAY BEFORE BREAKFAST 90 tablet 1   MAGNESIUM PO Take 250 mg by mouth daily.     Menthol, Topical Analgesic, (BIOFREEZE COLORLESS) 4 % GEL Apply 1 application topically daily as needed (Hip pain).     mesalamine (LIALDA) 1.2 g EC tablet Take 2.4 g by mouth in the morning and at bedtime.      metFORMIN (GLUCOPHAGE) 500 MG tablet TAKE 1 TABLET BY MOUTH TWICE A DAY WITH FOOD 180 tablet 3   montelukast (SINGULAIR) 10 MG tablet Take 10 mg by mouth at bedtime.       Multiple Vitamin (MULTIVITAMIN WITH MINERALS) TABS tablet Take 1 tablet by mouth daily.     predniSONE (DELTASONE) 20 MG tablet Take 2 tablets (40 mg total) by mouth daily with breakfast for 5 days. 10 tablet 0   promethazine (PHENERGAN) 12.5 MG tablet Take 1 tablet (12.5 mg total) by mouth every 6 (six) hours as needed for nausea or vomiting. 30 tablet 0   rosuvastatin (CRESTOR) 20 MG tablet TAKE 1 TABLET BY MOUTH EVERY DAY 90 tablet 1   sacubitril-valsartan (ENTRESTO) 97-103 MG Take 1 tablet by mouth 2 (two) times daily. 60 tablet 11   spironolactone (ALDACTONE) 25 MG tablet TAKE 1  TABLET (25 MG TOTAL) BY MOUTH DAILY. 90 tablet 3   SYMBICORT 160-4.5 MCG/ACT inhaler INHALE 2 PUFFS INTO THE LUNGS TWICE A DAY 30.6 each 1   triamcinolone (NASACORT) 55 MCG/ACT AERO nasal inhaler Place 2 sprays into the nose daily.     venlafaxine XR (EFFEXOR-XR) 150 MG 24 hr capsule Take 1 capsule (150 mg total) by mouth daily with breakfast. 90 capsule 1   zinc gluconate 50 MG tablet Take 50 mg by mouth daily.     zolpidem (AMBIEN) 5 MG tablet TAKE 1 TABLET BY MOUTH EVERY DAY AT BEDTIME AS NEEDED FOR SLEEP STRENGTH: 5 MG 30 tablet 2   No current facility-administered medications for this encounter.   BP 120/70   Pulse 81   Wt 75.9 kg (167 lb 6.4 oz)   SpO2 97%   BMI 29.65 kg/m  General: NAD Neck: No JVD, no thyromegaly or thyroid nodule.  Lungs: Clear to auscultation bilaterally with normal respiratory effort. CV: Nondisplaced PMI.  Heart regular S1/S2, no S3/S4, no murmur.  No peripheral edema.  No carotid bruit.  Normal pedal pulses.  Abdomen: Soft, nontender, no hepatosplenomegaly, no distention.  Skin: Intact without lesions or rashes.  Neurologic: Alert and oriented x 3.  Psych: Normal affect. Extremities: No clubbing or cyanosis.  HEENT: Normal.   Assessment/Plan: 1. Chronic systolic CHF: Nonischemic cardiomyopathy.  EF noted to be low since 1/22.  LHC in 3/22 with mild nonobstructive  coronary disease.  Last echo in 6/22 with EF 30-35%.  Cardic MRI in 9/22 showed EF 35% with moderate LV hypokinesis, RV EF 44%, small area of LGE at the true apex (?small embolic apical infarct).  Echo in 2/23 showed EF up to 40-45%.  Echo today showed EF 45% with septal bounce consistent with prior sternotomy, mild RV dysfunction, normal RV. Cause  of cardiomyopathy uncertain, her mother had a cardiomyopathy of uncertain etiology so cannot rule out familial cardiomyopathy.  She had treatment for breast cancer but this did not include chemotherapy.  She has been on adalimumab for ulcerative colitis, but this does not commonly cause CHF.  No drugs and rare ETOH.  The small area of apical LGE on cardiac MRI does not explain the cardiomyopathy.  NYHA class II symptoms, not volume overloaded on exam. - Increase Entresto to 97/103 bid.  BMET/BNP today and BMET in 10 days.  - Continue Coreg 12.5 mg bid  - Continue dapagliflozin 10 mg daily.  - Continue spironolactone 25 mg daily.   2. RV perforation with ICD placement: S/p sternotomy/repair in 6/22.    3. Sinus surgery: She is stable to undergo sinus surgery with Dr. Suszanne Conners with no further workup.   Followup in 4 months with APP.   Marca Ancona 02/05/2023

## 2023-02-06 ENCOUNTER — Encounter: Payer: Self-pay | Admitting: Family

## 2023-02-06 ENCOUNTER — Other Ambulatory Visit (HOSPITAL_COMMUNITY): Payer: Self-pay

## 2023-02-07 ENCOUNTER — Telehealth (HOSPITAL_COMMUNITY): Payer: Self-pay

## 2023-02-07 ENCOUNTER — Other Ambulatory Visit (HOSPITAL_COMMUNITY): Payer: Self-pay

## 2023-02-07 NOTE — Telephone Encounter (Signed)
Advanced Heart Failure Patient Advocate Encounter  Prior authorization for Vascepa has been submitted and approved. Test billing returns $5 for 30 day supply.  KeyDebara Pickett Effective: 02/07/2023 to 02/05/2026  Burnell Blanks, CPhT Rx Patient Advocate Phone: [phone]

## 2023-02-07 NOTE — Progress Notes (Signed)
Prior auth for Vascepa approved in separate encounter. $5 for 30 days, plan requires brand name.

## 2023-02-12 ENCOUNTER — Ambulatory Visit: Payer: No Typology Code available for payment source | Admitting: Family Medicine

## 2023-02-14 ENCOUNTER — Other Ambulatory Visit: Payer: Self-pay | Admitting: Family Medicine

## 2023-02-14 DIAGNOSIS — E782 Mixed hyperlipidemia: Secondary | ICD-10-CM

## 2023-02-15 ENCOUNTER — Encounter (HOSPITAL_COMMUNITY): Payer: Self-pay | Admitting: Otolaryngology

## 2023-02-15 ENCOUNTER — Other Ambulatory Visit (INDEPENDENT_AMBULATORY_CARE_PROVIDER_SITE_OTHER): Payer: Self-pay | Admitting: Otolaryngology

## 2023-02-15 ENCOUNTER — Other Ambulatory Visit: Payer: Self-pay

## 2023-02-15 NOTE — Progress Notes (Signed)
Anesthesia Chart Review: Same-day workup  57 year old female follows with cardiology for history of systolic CHF/nonischemic cardiomyopathy, RV perforation with ICD placement s/p sternotomy/repair in 09/2020.  History summarized in Dr. Alford Highland note 02/05/2023, "EF noted to be low since 1/22.  LHC in 3/22 with mild nonobstructive coronary disease.  Last echo in 6/22 with EF 30-35%.  Cardic MRI in 9/22 showed EF 35% with moderate LV hypokinesis, RV EF 44%, small area of LGE at the true apex (?small embolic apical infarct).  Echo in 2/23 showed EF up to 40-45%.  Echo today showed EF 45% with septal bounce consistent with prior sternotomy, mild RV dysfunction, normal RV. Cause  of cardiomyopathy uncertain, her mother had a cardiomyopathy of uncertain etiology so cannot rule out familial cardiomyopathy.  She had treatment for breast cancer but this did not include chemotherapy.  She has been on adalimumab for ulcerative colitis, but this does not commonly cause CHF.  No drugs and rare ETOH.  The small area of apical LGE on cardiac MRI does not explain the cardiomyopathy.  NYHA class II symptoms, not volume overloaded on exam."  She was continued on Coreg 12.5 mg daily, dapagliflozin 10 mg daily, spironolactone 25 g daily, increased Entresto to 97/103 twice daily.  She was also cleared for surgery at that time, "Sinus surgery: She is stable to undergo sinus surgery with Dr. Suszanne Conners with no further workup."  Other pertinent history includes former smoker (45 pack years, quit 1995), asthma, GERD, postoperative nausea and vomiting, migraines, hypothyroidism, right breast cancer s/p lumpectomy and radiation, non-insulin-dependent DM2 (A1c 6.9 on 01/01/2023).  BMP 02/05/2023 reviewed, unremarkable.  Patient will need day of surgery labs and evaluation.  EKG 02/05/2023: Normal sinus rhythm.  Rate 77. Rightward axis. Nonspecific T wave abnormality  TTE 02/05/2023:  1. Left ventricular ejection fraction, by estimation,  is 45%. The left  ventricle has mildly decreased function. The left ventricle demonstrates  global hypokinesis with septal bounce noted. Left ventricular diastolic  parameters are consistent with Grade  I diastolic dysfunction (impaired relaxation).   2. Right ventricular systolic function is mildly reduced. The right  ventricular size is normal. Tricuspid regurgitation signal is inadequate  for assessing PA pressure.   3. Left atrial size was mildly dilated.   4. The mitral valve is normal in structure. No evidence of mitral valve  regurgitation. No evidence of mitral stenosis.   5. The aortic valve is tricuspid. Aortic valve regurgitation is not  visualized. No aortic stenosis is present.   6. The inferior vena cava is normal in size with greater than 50%  respiratory variability, suggesting right atrial pressure of 3 mmHg.     Zannie Cove [facility] Short Stay Center/Anesthesiology Phone [phone] 02/15/2023 2:14 PM

## 2023-02-15 NOTE — Progress Notes (Signed)
PCP - Copland, Gwenlyn Found, MD  Cardiologist - DR. McLean   PPM/ICD - denies Device Orders - n/a Rep Notified - n/a  Chest x-ray - 09-23-22 EKG - 02-05-23 Stress Test -  ECHO - 02-05-23 Cardiac Cath - 3-22  CPAP - denies  GLP-1 -denies  Fasting Blood Sugar - Between 100-150 per patient  Checks Blood Sugar 1-2 a week (FARXIGA) Last dose 02-14-23 metFORMIN (GLUCOPHAGE) Last dose 02-15-23   Blood Thinner Instructions: denies Aspirin Instructions: aspirin EC LAST DOSE 10-28-4  ERAS Protcol - clear liquids until 10:30  COVID TEST- no  Anesthesia review: yes  Patient verbally denies any shortness of breath, fever, cough and chest pain during phone call   -------------  SDW INSTRUCTIONS given:  Your procedure is scheduled on February 16, 2023.  Report to [facility] Main Entrance "A" at 11:00 A.M., and check in at the Admitting office.  Call this number if you have problems the morning of surgery:  [phone]   Remember:  Do not eat after midnight the night before your surgery  You may drink clear liquids until 10:30 the morning of your surgery.   Clear liquids allowed are: Water, Non-Citrus Juices (without pulp), Carbonated Beverages, Clear Tea, Black Coffee Only, and Gatorade    Take these medicines the morning of surgery with A SIP OF WATER  lansoprazole (PREVACID SOLUTAB)  levothyroxine (SYNTHROID)  mesalamine (LIALDA)  rosuvastatin (CRESTOR)  SYMBICORT  triamcinolone (NASACORT  venlafaxine XR (EFFEXOR-XR  IF NEEDED acetaminophen (TYLENOL)  acyclovir (ZOVIRAX)  albuterol (VENTOLIN HFA) MAY BRING WITH YOU Hyoscyamine Sulfate  promethazine (PHENERGAN)     As of today, STOP taking any Aspirin (unless otherwise instructed by your surgeon) Aleve, Naproxen, Ibuprofen, Motrin, Advil, Goody's, BC's, all herbal medications, fish oil, and all vitamins.                      Do not wear jewelry, make up, or nail polish            Do not wear lotions, powders,  perfumes/colognes, or deodorant.            Do not shave 48 hours prior to surgery.  Men may shave face and neck.            Do not bring valuables to the hospital.            [facility] is not responsible for any belongings or valuables.  Do NOT Smoke (Tobacco/Vaping) 24 hours prior to your procedure If you use a CPAP at night, you may bring all equipment for your overnight stay.   Contacts, glasses, dentures or bridgework may not be worn into surgery.      For patients admitted to the hospital, discharge time will be determined by your treatment team.   Patients discharged the day of surgery will not be allowed to drive home, and someone needs to stay with them for 24 hours.    Special instructions:   [place]- Preparing For Surgery  Before surgery, you can play an important role. Because skin is not sterile, your skin needs to be as free of germs as possible. You can reduce the number of germs on your skin by washing with CHG (chlorahexidine gluconate) Soap before surgery.  CHG is an antiseptic cleaner which kills germs and bonds with the skin to continue killing germs even after washing.    Oral Hygiene is also important to reduce your risk of infection.  Remember - BRUSH  YOUR TEETH THE MORNING OF SURGERY WITH YOUR REGULAR TOOTHPASTE  Please do not use if you have an allergy to CHG or antibacterial soaps. If your skin becomes reddened/irritated stop using the CHG.  Do not shave (including legs and underarms) for at least 48 hours prior to first CHG shower. It is OK to shave your face.  Please follow these instructions carefully.   Shower the NIGHT BEFORE SURGERY and the MORNING OF SURGERY with DIAL Soap.   Pat yourself dry with a CLEAN TOWEL.  Wear CLEAN PAJAMAS to bed the night before surgery  Place CLEAN SHEETS on your bed the night of your first shower and DO NOT SLEEP WITH PETS.   Day of Surgery: Please shower morning of surgery  Wear Clean/Comfortable clothing the  morning of surgery Do not apply any deodorants/lotions.   Remember to brush your teeth WITH YOUR REGULAR TOOTHPASTE.   Questions were answered. Patient verbalized understanding of instructions.

## 2023-02-15 NOTE — Anesthesia Preprocedure Evaluation (Addendum)
Anesthesia Evaluation  Patient identified by MRN, date of birth, ID band Patient awake    Reviewed: Allergy & Precautions, NPO status , Patient's Chart, lab work & pertinent test results  History of Anesthesia Complications (+) PONV and history of anesthetic complications  Airway Mallampati: II  TM Distance: >3 FB Neck ROM: Full    Dental  (+) Teeth Intact, Dental Advisory Given   Pulmonary asthma , former smoker   Pulmonary exam normal breath sounds clear to auscultation       Cardiovascular hypertension, (-) angina + CAD and +CHF  (-) Cardiac Stents Normal cardiovascular exam Rhythm:Regular Rate:Normal     Neuro/Psych  Headaches PSYCHIATRIC DISORDERS Anxiety Depression       GI/Hepatic Neg liver ROS, PUD,GERD  ,,UC   Endo/Other  diabetes, Type 2, Oral Hypoglycemic AgentsHypothyroidism    Renal/GU negative Renal ROS     Musculoskeletal  (+) Arthritis ,    Abdominal   Peds  Hematology negative hematology ROS (+)   Anesthesia Other Findings Day of surgery medications reviewed with the patient.  Reproductive/Obstetrics                             Anesthesia Physical Anesthesia Plan  ASA: 3  Anesthesia Plan: General   Post-op Pain Management: Tylenol PO (pre-op)* and Toradol IV (intra-op)*   Induction: Intravenous  PONV Risk Score and Plan: 4 or greater and Scopolamine patch - Pre-op, Midazolam, TIVA, Dexamethasone and Ondansetron  Airway Management Planned: Oral ETT  Additional Equipment:   Intra-op Plan:   Post-operative Plan: Extubation in OR  Informed Consent: I have reviewed the patients History and Physical, chart, labs and discussed the procedure including the risks, benefits and alternatives for the proposed anesthesia with the patient or authorized representative who has indicated his/her understanding and acceptance.     Dental advisory given  Plan Discussed  with: CRNA  Anesthesia Plan Comments: (PAT note by Antionette Poles, PA-C: 57 year old female follows with cardiology for history of systolic CHF/nonischemic cardiomyopathy, RV perforation with ICD placement s/p sternotomy/repair in 09/2020.  History summarized in Dr. Alford Highland note 02/05/2023, "EF noted to be low since 1/22.  LHC in 3/22 with mild nonobstructive coronary disease.  Last echo in 6/22 with EF 30-35%.  Cardic MRI in 9/22 showed EF 35% with moderate LV hypokinesis, RV EF 44%, small area of LGE at the true apex (?small embolic apical infarct).  Echo in 2/23 showed EF up to 40-45%.  Echo today showed EF 45% with septal bounce consistent with prior sternotomy, mild RV dysfunction, normal RV. Cause  of cardiomyopathy uncertain, her mother had a cardiomyopathy of uncertain etiology so cannot rule out familial cardiomyopathy.  She had treatment for breast cancer but this did not include chemotherapy.  She has been on adalimumab for ulcerative colitis, but this does not commonly cause CHF.  No drugs and rare ETOH.  The small area of apical LGE on cardiac MRI does not explain the cardiomyopathy.  NYHA class II symptoms, not volume overloaded on exam."  She was continued on Coreg 12.5 mg daily, dapagliflozin 10 mg daily, spironolactone 25 g daily, increased Entresto to 97/103 twice daily.  She was also cleared for surgery at that time, "Sinus surgery: She is stable to undergo sinus surgery with Dr. Suszanne Conners with no further workup."  Other pertinent history includes former smoker (45 pack years, quit 1995), asthma, GERD, postoperative nausea and vomiting, migraines, hypothyroidism, right breast cancer s/p  lumpectomy and radiation, non-insulin-dependent DM2 (A1c 6.9 on 01/01/2023).  BMP 02/05/2023 reviewed, unremarkable.  Patient will need day of surgery labs and evaluation.  EKG 02/05/2023: Normal sinus rhythm.  Rate 77. Rightward axis. Nonspecific T wave abnormality  TTE 02/05/2023: 1. Left ventricular  ejection fraction, by estimation, is 45%. The left  ventricle has mildly decreased function. The left ventricle demonstrates  global hypokinesis with septal bounce noted. Left ventricular diastolic  parameters are consistent with Grade  I diastolic dysfunction (impaired relaxation).  2. Right ventricular systolic function is mildly reduced. The right  ventricular size is normal. Tricuspid regurgitation signal is inadequate  for assessing PA pressure.  3. Left atrial size was mildly dilated.  4. The mitral valve is normal in structure. No evidence of mitral valve  regurgitation. No evidence of mitral stenosis.  5. The aortic valve is tricuspid. Aortic valve regurgitation is not  visualized. No aortic stenosis is present.  6. The inferior vena cava is normal in size with greater than 50%  respiratory variability, suggesting right atrial pressure of 3 mmHg.   )        Anesthesia Quick Evaluation

## 2023-02-16 ENCOUNTER — Ambulatory Visit (HOSPITAL_COMMUNITY): Payer: BC Managed Care – PPO | Admitting: Physician Assistant

## 2023-02-16 ENCOUNTER — Other Ambulatory Visit: Payer: Self-pay

## 2023-02-16 ENCOUNTER — Encounter (HOSPITAL_COMMUNITY): Payer: Self-pay | Admitting: Otolaryngology

## 2023-02-16 ENCOUNTER — Ambulatory Visit (HOSPITAL_COMMUNITY)
Admission: RE | Admit: 2023-02-16 | Discharge: 2023-02-16 | Disposition: A | Discharge status: Home / Self Care | Payer: BC Managed Care – PPO | Attending: Otolaryngology | Admitting: Otolaryngology

## 2023-02-16 ENCOUNTER — Encounter (HOSPITAL_COMMUNITY)
Admission: RE | Disposition: A | Discharge status: Home / Self Care | Payer: Self-pay | Source: Home / Self Care | Attending: Otolaryngology

## 2023-02-16 DIAGNOSIS — I509 Heart failure, unspecified: Secondary | ICD-10-CM | POA: Insufficient documentation

## 2023-02-16 DIAGNOSIS — Z7984 Long term (current) use of oral hypoglycemic drugs: Secondary | ICD-10-CM | POA: Insufficient documentation

## 2023-02-16 DIAGNOSIS — J338 Other polyp of sinus: Secondary | ICD-10-CM | POA: Diagnosis not present

## 2023-02-16 DIAGNOSIS — Z8249 Family history of ischemic heart disease and other diseases of the circulatory system: Secondary | ICD-10-CM | POA: Insufficient documentation

## 2023-02-16 DIAGNOSIS — J320 Chronic maxillary sinusitis: Secondary | ICD-10-CM | POA: Insufficient documentation

## 2023-02-16 DIAGNOSIS — Z833 Family history of diabetes mellitus: Secondary | ICD-10-CM | POA: Insufficient documentation

## 2023-02-16 DIAGNOSIS — Z87891 Personal history of nicotine dependence: Secondary | ICD-10-CM | POA: Insufficient documentation

## 2023-02-16 DIAGNOSIS — J322 Chronic ethmoidal sinusitis: Secondary | ICD-10-CM | POA: Insufficient documentation

## 2023-02-16 DIAGNOSIS — J45909 Unspecified asthma, uncomplicated: Secondary | ICD-10-CM | POA: Insufficient documentation

## 2023-02-16 DIAGNOSIS — J323 Chronic sphenoidal sinusitis: Secondary | ICD-10-CM | POA: Diagnosis present

## 2023-02-16 DIAGNOSIS — E119 Type 2 diabetes mellitus without complications: Secondary | ICD-10-CM | POA: Diagnosis not present

## 2023-02-16 DIAGNOSIS — Z8711 Personal history of peptic ulcer disease: Secondary | ICD-10-CM | POA: Insufficient documentation

## 2023-02-16 DIAGNOSIS — I110 Hypertensive heart disease with heart failure: Secondary | ICD-10-CM | POA: Insufficient documentation

## 2023-02-16 HISTORY — PX: SINUS ENDO WITH FUSION: SHX5329

## 2023-02-16 HISTORY — PX: SPHENOIDECTOMY: SHX2421

## 2023-02-16 HISTORY — PX: ETHMOIDECTOMY: SHX5197

## 2023-02-16 HISTORY — PX: MAXILLARY ANTROSTOMY: SHX2003

## 2023-02-16 LAB — CBC
HCT: 42.3 % (ref 36.0–46.0)
Hemoglobin: 13.6 g/dL (ref 12.0–15.0)
MCH: 29 pg (ref 26.0–34.0)
MCHC: 32.2 g/dL (ref 30.0–36.0)
MCV: 90.2 fL (ref 80.0–100.0)
Platelets: 308 10*3/uL (ref 150–400)
RBC: 4.69 MIL/uL (ref 3.87–5.11)
RDW: 13.6 % (ref 11.5–15.5)
WBC: 8.6 10*3/uL (ref 4.0–10.5)
nRBC: 0 % (ref 0.0–0.2)

## 2023-02-16 LAB — GLUCOSE, CAPILLARY
Glucose-Capillary: 124 mg/dL — ABNORMAL HIGH (ref 70–99)
Glucose-Capillary: 127 mg/dL — ABNORMAL HIGH (ref 70–99)
Glucose-Capillary: 175 mg/dL — ABNORMAL HIGH (ref 70–99)

## 2023-02-16 SURGERY — ETHMOIDECTOMY
Anesthesia: General | Site: Nose | Laterality: Right

## 2023-02-16 MED ORDER — EPINEPHRINE HCL (NASAL) 0.1 % NA SOLN
NASAL | Status: AC
  Filled 2023-02-16: qty 30

## 2023-02-16 MED ORDER — LACTATED RINGERS IV SOLN: INTRAVENOUS | Status: DC

## 2023-02-16 MED ORDER — HYDROMORPHONE HCL 1 MG/ML IJ SOLN
INTRAMUSCULAR | Status: DC | PRN
  Administered 2023-02-16: .5 mg via INTRAVENOUS

## 2023-02-16 MED ORDER — ONDANSETRON HCL 4 MG/2ML IJ SOLN: 4.0000 mg | Freq: Once | INTRAMUSCULAR | Status: DC | PRN

## 2023-02-16 MED ORDER — OXYCODONE HCL 5 MG PO TABS
ORAL_TABLET | ORAL | Status: AC
  Filled 2023-02-16: qty 1

## 2023-02-16 MED ORDER — SCOPOLAMINE 1 MG/3DAYS TD PT72: 1.0000 | MEDICATED_PATCH | Freq: Once | TRANSDERMAL | Status: DC

## 2023-02-16 MED ORDER — PROPOFOL 500 MG/50ML IV EMUL
INTRAVENOUS | Status: DC | PRN
  Administered 2023-02-16: 150 ug/kg/min via INTRAVENOUS

## 2023-02-16 MED ORDER — SUGAMMADEX SODIUM 200 MG/2ML IV SOLN
INTRAVENOUS | Status: DC | PRN
  Administered 2023-02-16: 200 mg via INTRAVENOUS

## 2023-02-16 MED ORDER — MIDAZOLAM HCL 2 MG/2ML IJ SOLN
INTRAMUSCULAR | Status: DC | PRN
  Administered 2023-02-16: 2 mg via INTRAVENOUS

## 2023-02-16 MED ORDER — ACETAMINOPHEN 325 MG PO TABS: 325.0000 mg | ORAL_TABLET | ORAL | Status: DC | PRN

## 2023-02-16 MED ORDER — OXYCODONE HCL 5 MG PO TABS
5.0000 mg | ORAL_TABLET | Freq: Once | ORAL | Status: AC | PRN
  Administered 2023-02-16: 5 mg via ORAL

## 2023-02-16 MED ORDER — SCOPOLAMINE 1 MG/3DAYS TD PT72
MEDICATED_PATCH | TRANSDERMAL | Status: AC
  Administered 2023-02-16: 1.5 mg via TRANSDERMAL
  Filled 2023-02-16: qty 1

## 2023-02-16 MED ORDER — BACITRACIN ZINC 500 UNIT/GM EX OINT
TOPICAL_OINTMENT | CUTANEOUS | Status: AC
  Filled 2023-02-16: qty 28.35

## 2023-02-16 MED ORDER — MIDAZOLAM HCL 2 MG/2ML IJ SOLN
INTRAMUSCULAR | Status: AC
  Filled 2023-02-16: qty 2

## 2023-02-16 MED ORDER — HYDROMORPHONE HCL 1 MG/ML IJ SOLN
INTRAMUSCULAR | Status: AC
  Filled 2023-02-16: qty 0.5

## 2023-02-16 MED ORDER — ACETAMINOPHEN 500 MG PO TABS
1000.0000 mg | ORAL_TABLET | Freq: Once | ORAL | Status: AC
  Administered 2023-02-16: 1000 mg via ORAL
  Filled 2023-02-16: qty 2

## 2023-02-16 MED ORDER — BACITRACIN-NEOMYCIN-POLYMYXIN OINTMENT TUBE
TOPICAL_OINTMENT | CUTANEOUS | Status: AC
  Filled 2023-02-16: qty 14.17

## 2023-02-16 MED ORDER — CHLORHEXIDINE GLUCONATE 0.12 % MT SOLN: 15.0000 mL | Freq: Once | OROMUCOSAL | Status: AC

## 2023-02-16 MED ORDER — FENTANYL CITRATE (PF) 250 MCG/5ML IJ SOLN
INTRAMUSCULAR | Status: AC
  Filled 2023-02-16: qty 5

## 2023-02-16 MED ORDER — CEFAZOLIN SODIUM-DEXTROSE 2-3 GM-%(50ML) IV SOLR
INTRAVENOUS | Status: DC | PRN
  Administered 2023-02-16: 10 g via INTRAVENOUS

## 2023-02-16 MED ORDER — PHENYLEPHRINE HCL-NACL 20-0.9 MG/250ML-% IV SOLN
INTRAVENOUS | Status: DC | PRN
  Administered 2023-02-16: 55 ug/min via INTRAVENOUS

## 2023-02-16 MED ORDER — OXYCODONE HCL 5 MG/5ML PO SOLN: 5.0000 mg | Freq: Once | ORAL | Status: AC | PRN

## 2023-02-16 MED ORDER — MEPERIDINE HCL 25 MG/ML IJ SOLN: 6.2500 mg | INTRAMUSCULAR | Status: DC | PRN

## 2023-02-16 MED ORDER — INSULIN ASPART 100 UNIT/ML IJ SOLN: 0.0000 [IU] | INTRAMUSCULAR | Status: DC | PRN

## 2023-02-16 MED ORDER — ONDANSETRON HCL 4 MG/2ML IJ SOLN
INTRAMUSCULAR | Status: AC
  Filled 2023-02-16: qty 2

## 2023-02-16 MED ORDER — SODIUM CHLORIDE 0.9 % IV SOLN: INTRAVENOUS | Status: DC

## 2023-02-16 MED ORDER — BACITRACIN ZINC 500 UNIT/GM EX OINT
TOPICAL_OINTMENT | CUTANEOUS | Status: DC | PRN
  Administered 2023-02-16: 1 via TOPICAL

## 2023-02-16 MED ORDER — DEXAMETHASONE SODIUM PHOSPHATE 10 MG/ML IJ SOLN
INTRAMUSCULAR | Status: AC
  Filled 2023-02-16: qty 1

## 2023-02-16 MED ORDER — LIDOCAINE 2% (20 MG/ML) 5 ML SYRINGE
INTRAMUSCULAR | Status: DC | PRN
  Administered 2023-02-16: 60 mg via INTRAVENOUS

## 2023-02-16 MED ORDER — LIDOCAINE 2% (20 MG/ML) 5 ML SYRINGE
INTRAMUSCULAR | Status: AC
  Filled 2023-02-16: qty 5

## 2023-02-16 MED ORDER — FENTANYL CITRATE (PF) 250 MCG/5ML IJ SOLN
INTRAMUSCULAR | Status: DC | PRN
  Administered 2023-02-16: 100 ug via INTRAVENOUS
  Administered 2023-02-16: 50 ug via INTRAVENOUS
  Administered 2023-02-16: 100 ug via INTRAVENOUS

## 2023-02-16 MED ORDER — ONDANSETRON HCL 4 MG/2ML IJ SOLN
INTRAMUSCULAR | Status: DC | PRN
  Administered 2023-02-16: 4 mg via INTRAVENOUS

## 2023-02-16 MED ORDER — ROCURONIUM BROMIDE 10 MG/ML (PF) SYRINGE
PREFILLED_SYRINGE | INTRAVENOUS | Status: DC | PRN
  Administered 2023-02-16: 20 mg via INTRAVENOUS
  Administered 2023-02-16: 60 mg via INTRAVENOUS

## 2023-02-16 MED ORDER — SODIUM CHLORIDE 0.9 % IR SOLN
Status: DC | PRN
  Administered 2023-02-16: 1

## 2023-02-16 MED ORDER — AMOXICILLIN 875 MG PO TABS
875.0000 mg | ORAL_TABLET | Freq: Two times a day (BID) | ORAL | 0 refills | Status: AC
Start: 2023-02-16 — End: 2023-02-21

## 2023-02-16 MED ORDER — FENTANYL CITRATE (PF) 100 MCG/2ML IJ SOLN
25.0000 ug | INTRAMUSCULAR | Status: DC | PRN
  Administered 2023-02-16 (×2): 25 ug via INTRAVENOUS

## 2023-02-16 MED ORDER — CHLORHEXIDINE GLUCONATE 0.12 % MT SOLN
OROMUCOSAL | Status: AC
  Administered 2023-02-16: 15 mL via OROMUCOSAL
  Filled 2023-02-16: qty 15

## 2023-02-16 MED ORDER — OXYMETAZOLINE HCL 0.05 % NA SOLN
NASAL | Status: DC | PRN
  Administered 2023-02-16: 1 via TOPICAL

## 2023-02-16 MED ORDER — ROCURONIUM BROMIDE 10 MG/ML (PF) SYRINGE
PREFILLED_SYRINGE | INTRAVENOUS | Status: AC
  Filled 2023-02-16: qty 10

## 2023-02-16 MED ORDER — LIDOCAINE-EPINEPHRINE 1 %-1:100000 IJ SOLN
INTRAMUSCULAR | Status: AC
  Filled 2023-02-16: qty 1

## 2023-02-16 MED ORDER — FENTANYL CITRATE (PF) 100 MCG/2ML IJ SOLN
INTRAMUSCULAR | Status: AC
  Filled 2023-02-16: qty 2

## 2023-02-16 MED ORDER — PROPOFOL 10 MG/ML IV BOLUS
INTRAVENOUS | Status: DC | PRN
  Administered 2023-02-16: 120 mg via INTRAVENOUS
  Administered 2023-02-16: 20 mg via INTRAVENOUS
  Administered 2023-02-16: 30 mg via INTRAVENOUS

## 2023-02-16 MED ORDER — DEXMEDETOMIDINE HCL IN NACL 80 MCG/20ML IV SOLN
INTRAVENOUS | Status: DC | PRN
  Administered 2023-02-16: 12 ug via INTRAVENOUS
  Administered 2023-02-16: 8 ug via INTRAVENOUS

## 2023-02-16 MED ORDER — ORAL CARE MOUTH RINSE: 15.0000 mL | Freq: Once | OROMUCOSAL | Status: AC

## 2023-02-16 MED ORDER — OXYMETAZOLINE HCL 0.05 % NA SOLN
NASAL | Status: AC
  Filled 2023-02-16: qty 30

## 2023-02-16 MED ORDER — 0.9 % SODIUM CHLORIDE (POUR BTL) OPTIME
TOPICAL | Status: DC | PRN
  Administered 2023-02-16: 1000 mL

## 2023-02-16 MED ORDER — CEFAZOLIN SODIUM 1 G IJ SOLR
INTRAMUSCULAR | Status: AC
  Filled 2023-02-16: qty 20

## 2023-02-16 MED ORDER — ACETAMINOPHEN 160 MG/5ML PO SOLN: 325.0000 mg | ORAL | Status: DC | PRN

## 2023-02-16 SURGICAL SUPPLY — 52 items
AGENT HMST KT MTR STRL THRMB (HEMOSTASIS)
BAG COUNTER SPONGE SURGICOUNT (BAG) ×1 IMPLANT
BAG SPNG CNTER NS LX DISP (BAG)
BLADE RAD40 ROTATE 4M 4 5PK (BLADE) IMPLANT
BLADE RAD60 ROTATE M4 4 5PK (BLADE) IMPLANT
BLADE ROTATE TRICUT 4X13 M4 (BLADE) ×1 IMPLANT
BLADE SURG 15 STRL LF DISP TIS (BLADE) IMPLANT
BLADE SURG 15 STRL SS (BLADE)
BLADE TRICUT ROTATE M4 4 5PK (BLADE) IMPLANT
CANISTER SUCT 3000ML PPV (MISCELLANEOUS) ×1 IMPLANT
CNTNR URN SCR LID CUP LEK RST (MISCELLANEOUS) ×1 IMPLANT
COAGULATOR SUCT SWTCH 10FR 6 (ELECTROSURGICAL) ×1 IMPLANT
CONT SPEC 4OZ STRL OR WHT (MISCELLANEOUS) ×1
DRAPE HALF SHEET 40X57 (DRAPES) IMPLANT
DRAPE SHEET LG 3/4 BI-LAMINATE (DRAPES) IMPLANT
DRSG NASOPORE 8CM (GAUZE/BANDAGES/DRESSINGS) IMPLANT
ELECT COATED BLADE 2.86 ST (ELECTRODE) IMPLANT
ELECT REM PT RETURN 9FT ADLT (ELECTROSURGICAL) ×1
ELECTRODE REM PT RTRN 9FT ADLT (ELECTROSURGICAL) ×1 IMPLANT
FILTER ARTHROSCOPY CONVERTOR (FILTER) ×1 IMPLANT
GAUZE 4X4 16PLY ~~LOC~~+RFID DBL (SPONGE) ×1 IMPLANT
GAUZE SPONGE 2X2 8PLY STRL LF (GAUZE/BANDAGES/DRESSINGS) ×1 IMPLANT
GLOVE ECLIPSE 7.5 STRL STRAW (GLOVE) ×1 IMPLANT
GOWN STRL REUS W/ TWL LRG LVL3 (GOWN DISPOSABLE) ×2 IMPLANT
GOWN STRL REUS W/TWL LRG LVL3 (GOWN DISPOSABLE) ×2
KIT BASIN OR (CUSTOM PROCEDURE TRAY) ×1 IMPLANT
KIT TURNOVER KIT B (KITS) ×1 IMPLANT
NDL HYPO 25GX1X1/2 BEV (NEEDLE) IMPLANT
NDL SPNL 25GX3.5 QUINCKE BL (NEEDLE) ×1 IMPLANT
NEEDLE HYPO 25GX1X1/2 BEV (NEEDLE)
NEEDLE SPNL 25GX3.5 QUINCKE BL (NEEDLE) ×1
NS IRRIG 1000ML POUR BTL (IV SOLUTION) ×1 IMPLANT
PACK EENT II TURBAN DRAPE (CUSTOM PROCEDURE TRAY) ×1 IMPLANT
PAD ARMBOARD 7.5X6 YLW CONV (MISCELLANEOUS) ×2 IMPLANT
PENCIL SMOKE EVACUATOR (MISCELLANEOUS) ×1 IMPLANT
POSITIONER HEAD DONUT 9IN (MISCELLANEOUS) ×1 IMPLANT
SOL ANTI FOG 6CC (MISCELLANEOUS) IMPLANT
SPLINT NASAL DOYLE BI-VL (GAUZE/BANDAGES/DRESSINGS) ×1 IMPLANT
SPONGE NEURO XRAY DETECT 1X3 (DISPOSABLE) ×1 IMPLANT
SURGIFLO W/THROMBIN 8M KIT (HEMOSTASIS) IMPLANT
SUT PLAIN 4 0 ~~LOC~~ 1 (SUTURE) ×1 IMPLANT
SYR CONTROL 10ML LL (SYRINGE) IMPLANT
TOWEL GREEN STERILE FF (TOWEL DISPOSABLE) ×1 IMPLANT
TRACKER ENT INSTRUMENT (MISCELLANEOUS) ×1 IMPLANT
TRACKER ENT PATIENT (MISCELLANEOUS) ×1 IMPLANT
TRAY ENT MC OR (CUSTOM PROCEDURE TRAY) ×1 IMPLANT
TUBE CONNECTING 12X1/4 (SUCTIONS) ×1 IMPLANT
TUBE SALEM SUMP 16F (TUBING) ×1 IMPLANT
TUBING EXTENTION W/L.L. (IV SETS) ×1 IMPLANT
TUBING STRAIGHTSHOT EPS 5PK (TUBING) ×1 IMPLANT
WATER STERILE IRR 1000ML POUR (IV SOLUTION) ×1 IMPLANT
YANKAUER SUCT BULB TIP NO VENT (SUCTIONS) ×1 IMPLANT

## 2023-02-16 NOTE — Op Note (Signed)
DATE OF PROCEDURE: 02/16/2023  OPERATIVE REPORT   SURGEON: Newman Pies, MD   PREOPERATIVE DIAGNOSES:  1.  Chronic right maxillary, ethmoid, and sphenoid sinusitis and polyposis  POSTOPERATIVE DIAGNOSES:  1.  Chronic right maxillary, ethmoid, and sphenoid sinusitis and polyposis 2.  Right sphenoid fungus balls  PROCEDURE PERFORMED:  1.  Right endoscopic total ethmoidectomy and sphenoidotomy with polyp and fungus ball removal 2.  Right endoscopic maxillary antrostomy with polyp removal. 3.  FUSION stereotactic image guidance  ANESTHESIA: General endotracheal tube anesthesia.   COMPLICATIONS: None.   ESTIMATED BLOOD LOSS: 400 mL.   INDICATION FOR PROCEDURE: Erin Fields is a 57 y.o. female with a history of chronic rhinosinusitis.  She has been symptomatic for 10+ years.  She previously underwent multiple sinus surgeries.  She was treated with 4 courses of antibiotics and multiple courses of systemic steroids in 2024.  Despite her treatment, she continued to be symptomatic.  She complained of frequent headaches, facial pressure and purulent drainage from the right nasal cavity.  She recently underwent a head CT scan.  The CT showed opacification of the right sphenoid sinus, and partial opacification of the right ethmoid sinuses.  Her subsequent sinus CT scan also showed significant right-sided maxillary sinus disease. Based on the above findings, the decision was made for the patient to undergo the above-stated procedures. The risks, benefits, alternatives, and details of the procedures were discussed with the patient. Questions were invited and answered. Informed consent was obtained.   DESCRIPTION OF PROCEDURE: The patient was taken to the operating room and placed supine on the operating table. General endotracheal tube anesthesia was administered by the anesthesiologist. The patient was positioned, and prepped and draped in the standard fashion for nasal surgery. Pledgets soaked with Afrin  were placed in both nasal cavities for decongestion. The pledgets were subsequently removed. The FUSION stereotactic image guidance marker was placed. The image guidance system was functional throughout the case.  Using a 0 endoscope, the right nasal cavity was examined. A large middle turbinate was noted. Using Tru-Cut forceps, the inferior one third and medial one half of the middle turbinate was resected. Polypoid tissue was noted within the middle meatus. The polypoid tissue was removed using a combination of microdebrider and Blakesley forceps. The maxillary antrum was entered and enlarged using a combination of backbiter and microdebrider. Polypoid tissue was removed from the right maxillary sinus.  Attention was then focused on the ethmoid sinuses. The bony partitions of the anterior and posterior ethmoid cavities were taken down. Polypoid tissue was noted and removed.  More polyps were noted to obstruct the left sphenoid opening. The polyps were removed. The sphenoid opening was entered and enlarged. A large amount of fungal debris/fungal balls were removed from the right sphenoid sinus. All sinuses were copiously irrigated with saline solution. Nasopore packing was placed.  The care of the patient was turned over to the anesthesiologist. The patient was awakened from anesthesia without difficulty. The patient was extubated and transferred to the recovery room in good condition.   OPERATIVE FINDINGS: Chronic right maxillary, ethmoid, and sphenoid sinusitis and polyposis.  Right sphenoid fungus balls.  SPECIMEN: Right sided sinus content.   FOLLOWUP CARE: The patient be discharged home once she is awake and alert. The patient will be placed on  amoxicillin 875 mg p.o. b.i.d. for 5 days. The patient will follow up in my office in 1 week.  Jamair Cato Philomena Doheny, MD

## 2023-02-16 NOTE — H&P (Signed)
CC: Chronic rhinosinusitis  HPI: The patient is a 57 year old female who presents today complaining of chronic rhinosinusitis.  According to the patient, she has been symptomatic for 10+ years.  She previously underwent multiple sinus surgeries.  She was treated with at least 3 courses of antibiotics and multiple courses of systemic steroids in 2024.  Despite her treatment, she continues to be symptomatic.  She complains of frequent headaches, facial pressure and purulent drainage from the right nasal cavity.  She recently underwent a head CT scan.  The CT showed opacification of the right sphenoid sinus, and partial opacification of the right ethmoid sinuses.  Her subsequent sinus CT scan also showed significant right-sided maxillary sinus disease.  She has not noted any improvement with the antibiotic and steroid treatment.   The patient's review of systems (constitutional, eyes, ENT, cardiovascular, respiratory, GI, musculoskeletal, skin, neurologic, psychiatric, endocrine, hematologic, allergic) is noted in the ROS questionnaire.  It is reviewed with the patient.  Major events: Sinus surgery, foot and hand surgery.  Ongoing medical problems: Hypertension, reflux, asthma, depression, breast and skin  cancer, thyroid disease, anxiety, allergies, diabetes, autoimmune disorder, Heart disease.  Family health history: Diabetes, heart disease.  Social history: The patient is a widow. She is a former smoker. She denies the use of alcohol or illegal drugs.    Exam: General: Communicates without difficulty, well nourished, no acute distress. Head: Normocephalic, no evidence injury, no tenderness, facial buttresses intact without stepoff. Face/sinus: No tenderness to palpation and percussion. Facial movement is normal and symmetric. Eyes: PERRL, EOMI. No scleral icterus, conjunctivae clear. Neuro: CN II exam reveals vision grossly intact.  No nystagmus at any point of gaze. Ears: Auricles well formed without  lesions.  Ear canals are intact without mass or lesion.  No erythema or edema is appreciated.  The TMs are intact without fluid. Nose: External evaluation reveals normal support and skin without lesions.  Dorsum is intact.  Anterior rhinoscopy reveals congested mucosa over anterior aspect of inferior turbinates and intact septum.  Oral:  Oral cavity and oropharynx are intact, symmetric, without erythema or edema.  Mucosa is moist without lesions. Neck: Full range of motion without pain.  There is no significant lymphadenopathy.  No masses palpable.  Thyroid bed within normal limits to palpation.  Parotid glands and submandibular glands equal bilaterally without mass.  Trachea is midline. Neuro:  CN 2-12 grossly intact. A flexible scope was inserted into the right nasal cavity.  Endoscopy of the interior nasal cavity, superior, inferior, and middle meatus was performed. The sphenoid-ethmoid recess was examined.  Mucopurulent drainage was noted from the right ethmoid and sphenoid openings.  Nasopharynx was clear.  Turbinates were hypertrophied but without mass.  The procedure was repeated on the contralateral side.  No purulent drainage was noted on the left side.  The patient tolerated the procedure well.   Assessment: 1.  Chronic right sphenoid, maxillary, and ethmoid sinusitis.  Mucopurulent drainage is noted from the right ethmoid and right sphenoid openings.   2.  No purulent drainage is noted on the left side.   Plan: 1.  The physical exam and nasal endoscopy findings are reviewed with the patient.  2.  The patient should continue with her allergy treatment regimen of Zyrtec, Nasacort, Astelin, and Singulair.   3.  Based on the above findings, the patient will likely benefit from revision sinus surgery to treat the right maxillary, ethmoid and sphenoid sinuses.  The risks, benefits, alternatives and details of the procedures  are reviewed with the patient.  4.  The patient would like to proceed with the  procedures.

## 2023-02-16 NOTE — Anesthesia Procedure Notes (Signed)
Procedure Name: Intubation Date/Time: 02/16/2023 2:33 PM  Performed by: Waynard Edwards, CRNAPre-anesthesia Checklist: Patient identified, Emergency Drugs available, Suction available and Patient being monitored Patient Re-evaluated:Patient Re-evaluated prior to induction Oxygen Delivery Method: Circle System Utilized Preoxygenation: Pre-oxygenation with 100% oxygen Induction Type: IV induction Ventilation: Mask ventilation without difficulty Laryngoscope Size: Glidescope and 4 Grade View: Grade I Tube type: Oral Number of attempts: 2 (one DL attempt by SRNA, Glidescope intubation by SRNA) Airway Equipment and Method: Stylet and Oral airway Placement Confirmation: ETT inserted through vocal cords under direct vision, positive ETCO2 and breath sounds checked- equal and bilateral Secured at: 21 cm Tube secured with: Tape Dental Injury: Teeth and Oropharynx as per pre-operative assessment  Difficulty Due To: Difficult Airway- due to anterior larynx

## 2023-02-16 NOTE — Anesthesia Postprocedure Evaluation (Signed)
Anesthesia Post Note  Patient: Erin Fields  Procedure(s) Performed: RIGHT TOTAL ETHMOIDECTOMY (Right: Nose) RIGHT SPHENOIDECTOMY WITH TISSUE REMOVAL (Right: Nose) SINUS ENDO WITH FUSION (Right: Nose) RIGHT MAXILLARY ANTROSTOMY WITH TISSUE REMOVAL (Right: Nose)     Patient location during evaluation: PACU Anesthesia Type: General Level of consciousness: awake and alert Pain management: pain level controlled Vital Signs Assessment: post-procedure vital signs reviewed and stable Respiratory status: spontaneous breathing, nonlabored ventilation, respiratory function stable and patient connected to nasal cannula oxygen Cardiovascular status: blood pressure returned to baseline and stable Postop Assessment: no apparent nausea or vomiting Anesthetic complications: no   No notable events documented.  Last Vitals:  Vitals:   02/16/23 1615 02/16/23 1630  BP: (!) 169/82 135/76  Pulse: 81 85  Resp: 13 15  Temp:    SpO2: 96% 96%    Last Pain:  Vitals:   02/16/23 1638  TempSrc:   PainSc: 5                  Aracelis Ulrey

## 2023-02-16 NOTE — Discharge Instructions (Addendum)
POSTOPERATIVE INSTRUCTIONS FOR PATIENTS HAVING NASAL OR SINUS OPERATIONS ACTIVITY: Restrict activity at home for the first two days, resting as much as possible. Light activity is best. You may usually return to work within a week. You should refrain from nose blowing, strenuous activity, or heavy lifting greater than 20lbs for a total of one week after your operation.  If sneezing cannot be avoided, sneeze with your mouth open. DISCOMFORT: You may experience a dull headache and pressure along with nasal congestion and discharge. These symptoms may be worse during the first week after the operation but may last as long as two to four weeks.  Please take Tylenol or the pain medication that has been prescribed for you. Do not take aspirin or aspirin containing medications since they may cause bleeding.  You may experience symptoms of post nasal drainage, nasal congestion, headaches and fatigue for two or three months after your operation.  BLEEDING: You may have some blood tinged nasal drainage for approximately two weeks after the operation.  The discharge will be worse for the first week.  Please call our office at [phone] or go to the nearest hospital emergency room if you experience any of the following: heavy, bright red blood from your nose or mouth that lasts longer than 15 minutes or coughing up or vomiting bright red blood or blood clots. GENERAL CONSIDERATIONS: A gauze dressing will be placed on your upper lip to absorb any drainage after the operation. You may need to change this several times a day.  If you do not have very much drainage, you may remove the dressing.  Remember that you may gently wipe your nose with a tissue and sniff in, but DO NOT blow your nose. Please keep all of your postoperative appointments.  Your final results after the operation will depend on proper follow-up.  The initial visit is usually 2 to 5 days after the operation.  During this visit, the remaining nasal  packing and internal septal splints will be removed.  Your nasal and sinus cavities will be cleaned.  During the second visit, your nasal and sinus cavities will be cleaned again. Have someone drive you to your first two postoperative appointments.  How you care for your nose after the operation will influence the results that you obtain.  You should follow all directions, take your medication as prescribed, and call our office [phone] with any problems or questions. You may be more comfortable sleeping with your head elevated on two pillows. Do not take any medications that we have not prescribed or recommended. WARNING SIGNS: if any of the following should occur, please call our office: Persistent fever greater than 102F. Persistent vomiting. Severe and constant pain that is not relieved by prescribed pain medication. Trauma to the nose. Rash or unusual side effects from any medicines.  

## 2023-02-16 NOTE — Transfer of Care (Signed)
Immediate Anesthesia Transfer of Care Note  Patient: Erin Fields  Procedure(s) Performed: RIGHT TOTAL ETHMOIDECTOMY (Right: Nose) RIGHT SPHENOIDECTOMY WITH TISSUE REMOVAL (Right: Nose) SINUS ENDO WITH FUSION (Right: Nose) RIGHT MAXILLARY ANTROSTOMY WITH TISSUE REMOVAL (Right: Nose)  Patient Location: PACU  Anesthesia Type:General  Level of Consciousness: awake, patient cooperative, and responds to stimulation  Airway & Oxygen Therapy: Patient Spontanous Breathing and Patient connected to face mask oxygen  Post-op Assessment: Report given to RN, Post -op Vital signs reviewed and stable, and Patient moving all extremities X 4  Post vital signs: Reviewed and stable  Last Vitals:  Vitals Value Taken Time  BP 158/75 02/16/23 1557  Temp    Pulse 76 02/16/23 1559  Resp 14 02/16/23 1559  SpO2 95 % 02/16/23 1559  Vitals shown include unfiled device data.  Last Pain:  Vitals:   02/16/23 1208  TempSrc:   PainSc: 2       Patients Stated Pain Goal: 0 (02/16/23 1208)  Complications: No notable events documented.

## 2023-02-17 ENCOUNTER — Encounter (HOSPITAL_COMMUNITY): Payer: Self-pay | Admitting: Otolaryngology

## 2023-02-20 ENCOUNTER — Telehealth (HOSPITAL_COMMUNITY): Payer: Self-pay

## 2023-02-20 LAB — SURGICAL PATHOLOGY

## 2023-02-20 MED ORDER — ICOSAPENT ETHYL 1 G PO CAPS: 2.0000 g | ORAL_CAPSULE | Freq: Two times a day (BID) | ORAL | 5 refills | Status: DC

## 2023-02-20 NOTE — Telephone Encounter (Signed)
Meds ordered this encounter  Medications   icosapent Ethyl (VASCEPA) 1 g capsule    Sig: Take 2 capsules (2 g total) by mouth 2 (two) times daily.    Dispense:  120 capsule    Refill:  5

## 2023-02-20 NOTE — Telephone Encounter (Signed)
-----   Message from Delsa Bern sent at 02/07/2023 11:13 AM EDT ----- PA approved. $5 for 30 days. ----- Message ----- From: Chinita Pester, CMA Sent: 02/06/2023   4:13 PM EDT To: Delsa Bern, CPhT  Can you check please since he prefers vascepa ----- Message ----- From: Delsa Bern, CPhT Sent: 02/06/2023   8:29 AM EDT To: Roxine Whittinghill R Delisa Finck, CMA  Vascepa would require a prior authorization on this plan. The fenofibrate would be $5 per 30 days ($15 for 90).  Let me know if you'd like me to send the prior auth to see what the cost would be, if approved! ----- Message ----- From: Chinita Pester, CMA Sent: 02/05/2023   5:53 PM EDT To: Delsa Bern, CPhT  Hey can you tell me how much vascepa would be for this patient?

## 2023-02-23 ENCOUNTER — Ambulatory Visit (INDEPENDENT_AMBULATORY_CARE_PROVIDER_SITE_OTHER): Payer: BC Managed Care – PPO | Admitting: Otolaryngology

## 2023-02-23 ENCOUNTER — Encounter (INDEPENDENT_AMBULATORY_CARE_PROVIDER_SITE_OTHER): Payer: Self-pay

## 2023-02-23 VITALS — Ht 63.0 in | Wt 170.0 lb

## 2023-02-23 DIAGNOSIS — J322 Chronic ethmoidal sinusitis: Secondary | ICD-10-CM | POA: Insufficient documentation

## 2023-02-23 DIAGNOSIS — J323 Chronic sphenoidal sinusitis: Secondary | ICD-10-CM | POA: Insufficient documentation

## 2023-02-23 DIAGNOSIS — R0981 Nasal congestion: Secondary | ICD-10-CM

## 2023-02-23 DIAGNOSIS — J338 Other polyp of sinus: Secondary | ICD-10-CM

## 2023-02-23 DIAGNOSIS — J320 Chronic maxillary sinusitis: Secondary | ICD-10-CM

## 2023-02-23 DIAGNOSIS — J3489 Other specified disorders of nose and nasal sinuses: Secondary | ICD-10-CM | POA: Diagnosis not present

## 2023-02-23 NOTE — Progress Notes (Signed)
Patient ID: Erin Fields, female   DOB: [birth year], 57 y.o.   MRN: [id number]  Procedure: Nasal/sinus debridement status post endoscopic sinus surgery.  Indication: The patient previously underwent right endoscopic sinus surgery to treat her chronic right maxillary, ethmoid, and sphenoid sinusitis and polyposis.  Fungus balls were noted within the right sphenoid sinus.  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  The patient returns today complaining of significant nasal congestion and crusting.  She had a moderate amount of bleeding from the nasal cavities.  Currently the patient denies any facial pain, fever, or visual change.  Anesthesia: Topical Xylocaine and Neo-Synephrine.  Description: The patient is placed upright in the exam chair. Both nasal cavities are sprayed with topical Xylocaine and Neo-Synephrine.  A 0 rigid endoscope is used for the examination. The scope is advanced past the right nostril into the right nasal cavity. A significant amount of crusting and blood clots are noted within the right nasal cavity and  the maxillary/ethmoid cavities.  The crusting and blood clots are removed with suction catheters and alligator forceps, which are inserted in parallel with the rigid endoscope.  After the debridement procedure, the maxillary antrum and the ethmoid cavities are noted to be widely patent.  The sphenoid opening is also patent.  The patient tolerated the procedure well.  Follow up care: The patient is instructed to perform daily nasal saline irrigation.  The patient will return for re-evaluation in approximately 3 weeks.

## 2023-02-26 ENCOUNTER — Other Ambulatory Visit (HOSPITAL_COMMUNITY): Payer: BC Managed Care – PPO

## 2023-03-02 ENCOUNTER — Inpatient Hospital Stay: Payer: BC Managed Care – PPO | Attending: Nurse Practitioner

## 2023-03-02 DIAGNOSIS — Z853 Personal history of malignant neoplasm of breast: Secondary | ICD-10-CM | POA: Diagnosis present

## 2023-03-02 DIAGNOSIS — C50411 Malignant neoplasm of upper-outer quadrant of right female breast: Secondary | ICD-10-CM

## 2023-03-02 LAB — CMP (CANCER CENTER ONLY)
ALT: 12 U/L (ref 0–44)
AST: 15 U/L (ref 15–41)
Albumin: 4.6 g/dL (ref 3.5–5.0)
Alkaline Phosphatase: 53 U/L (ref 38–126)
Anion gap: 7 (ref 5–15)
BUN: 7 mg/dL (ref 6–20)
CO2: 28 mmol/L (ref 22–32)
Calcium: 9.9 mg/dL (ref 8.9–10.3)
Chloride: 101 mmol/L (ref 98–111)
Creatinine: 0.7 mg/dL (ref 0.44–1.00)
GFR, Estimated: >60 mL/min (ref >60)
Glucose, Bld: 104 mg/dL — ABNORMAL HIGH (ref 70–99)
Potassium: 4.7 mmol/L (ref 3.5–5.1)
Sodium: 136 mmol/L (ref 135–145)
Total Bilirubin: 0.7 mg/dL (ref <1.2)
Total Protein: 7.1 g/dL (ref 6.5–8.1)

## 2023-03-02 LAB — CBC WITH DIFFERENTIAL (CANCER CENTER ONLY)
Abs Immature Granulocytes: 0.05 10*3/uL (ref 0.00–0.07)
Basophils Absolute: 0.1 10*3/uL (ref 0.0–0.1)
Basophils Relative: 1 %
Eosinophils Absolute: 0.3 10*3/uL (ref 0.0–0.5)
Eosinophils Relative: 3 %
HCT: 39 % (ref 36.0–46.0)
Hemoglobin: 12.8 g/dL (ref 12.0–15.0)
Immature Granulocytes: 1 %
Lymphocytes Relative: 31 %
Lymphs Abs: 2.9 10*3/uL (ref 0.7–4.0)
MCH: 30.2 pg (ref 26.0–34.0)
MCHC: 32.8 g/dL (ref 30.0–36.0)
MCV: 92 fL (ref 80.0–100.0)
Monocytes Absolute: 0.8 10*3/uL (ref 0.1–1.0)
Monocytes Relative: 9 %
Neutro Abs: 5 10*3/uL (ref 1.7–7.7)
Neutrophils Relative %: 55 %
Platelet Count: 453 10*3/uL — ABNORMAL HIGH (ref 150–400)
RBC: 4.24 MIL/uL (ref 3.87–5.11)
RDW: 13.2 % (ref 11.5–15.5)
WBC Count: 9.1 10*3/uL (ref 4.0–10.5)
nRBC: 0 % (ref 0.0–0.2)

## 2023-03-06 ENCOUNTER — Encounter: Payer: Self-pay | Admitting: Hematology

## 2023-03-09 ENCOUNTER — Ambulatory Visit (HOSPITAL_COMMUNITY)
Admission: RE | Admit: 2023-03-09 | Discharge: 2023-03-09 | Disposition: A | Discharge status: Home / Self Care | Payer: BC Managed Care – PPO | Source: Ambulatory Visit | Attending: Cardiology | Admitting: Cardiology

## 2023-03-09 DIAGNOSIS — I5022 Chronic systolic (congestive) heart failure: Secondary | ICD-10-CM | POA: Insufficient documentation

## 2023-03-09 LAB — BASIC METABOLIC PANEL
Anion gap: 10 (ref 5–15)
BUN: 8 mg/dL (ref 6–20)
CO2: 24 mmol/L (ref 22–32)
Calcium: 9.4 mg/dL (ref 8.9–10.3)
Chloride: 102 mmol/L (ref 98–111)
Creatinine, Ser: 0.65 mg/dL (ref 0.44–1.00)
GFR, Estimated: >60 mL/min (ref >60)
Glucose, Bld: 121 mg/dL — ABNORMAL HIGH (ref 70–99)
Potassium: 4.5 mmol/L (ref 3.5–5.1)
Sodium: 136 mmol/L (ref 135–145)

## 2023-03-18 ENCOUNTER — Other Ambulatory Visit (HOSPITAL_COMMUNITY): Payer: Self-pay | Admitting: Cardiology

## 2023-03-19 ENCOUNTER — Ambulatory Visit (INDEPENDENT_AMBULATORY_CARE_PROVIDER_SITE_OTHER): Payer: BC Managed Care – PPO

## 2023-03-19 ENCOUNTER — Encounter (INDEPENDENT_AMBULATORY_CARE_PROVIDER_SITE_OTHER): Payer: Self-pay

## 2023-03-19 VITALS — Ht 63.0 in | Wt 170.0 lb

## 2023-03-19 DIAGNOSIS — J323 Chronic sphenoidal sinusitis: Secondary | ICD-10-CM | POA: Diagnosis not present

## 2023-03-19 DIAGNOSIS — J338 Other polyp of sinus: Secondary | ICD-10-CM | POA: Diagnosis not present

## 2023-03-19 DIAGNOSIS — J322 Chronic ethmoidal sinusitis: Secondary | ICD-10-CM

## 2023-03-19 DIAGNOSIS — J320 Chronic maxillary sinusitis: Secondary | ICD-10-CM

## 2023-03-19 NOTE — Progress Notes (Unsigned)
 Patient ID: Erin Fields, female   DOB: [birth year], 57 y.o.   MRN: [id number]  Procedure: Nasal/sinus debridement status post endoscopic sinus surgery.  Indication: The patient previously underwent right endoscopic sinus surgery to treat her chronic right maxillary, ethmoid, and sphenoid sinusitis and polyposis.  Fungus balls were noted within the right sphenoid sinus.  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  The patient returns today complaining of significant nasal congestion and crusting.  She had a moderate amount of bleeding from the nasal cavities.  Currently the patient denies any facial pain, fever, or visual change.  Anesthesia: Topical Xylocaine and Neo-Synephrine.  Description: The patient is placed upright in the exam chair. Both nasal cavities are sprayed with topical Xylocaine and Neo-Synephrine.  A 0 rigid endoscope is used for the examination. The scope is advanced past the right nostril into the right nasal cavity. A significant amount of crusting and blood clots are noted within the right nasal cavity and  the maxillary/ethmoid cavities.  The crusting and blood clots are removed with suction catheters and alligator forceps, which are inserted in parallel with the rigid endoscope.  After the debridement procedure, the maxillary antrum and the ethmoid cavities are noted to be widely patent.  The sphenoid opening is also patent.  The patient tolerated the procedure well.  Follow up care: The patient is instructed to perform daily nasal saline irrigation.  The patient will return for re-evaluation in approximately 3 weeks.

## 2023-03-20 ENCOUNTER — Other Ambulatory Visit: Payer: Self-pay | Admitting: Family Medicine

## 2023-03-20 DIAGNOSIS — F5104 Psychophysiologic insomnia: Secondary | ICD-10-CM

## 2023-03-27 NOTE — Progress Notes (Unsigned)
[facility] at [facility] [address] [place], [place] [address] [phone]  Date:  03/28/2023   Name:  Erin Fields   DOB:  Feb 29, 1966   MRN:  [id number]  PCP:  Pearline Cables, MD    Chief Complaint: cough, congestion (Sxs include: green colored mucus. She feels like her congestion is in her chest. Sinus surgery on 02/16/23. /DM urine and Foot exam is due)   History of Present Illness:  Erin Fields is a 57 y.o. very pleasant female patient who presents with the following:  Pt seen today with concern of congestion and cough Last seen by myself in October history significant for right-sided breast cancer, type 2 diabetes, ulcerative colitis on Humira ,nonobstructive coronary artery disease, hypertension, hyperlipidemia, previous history of cardiomyopathy and ICD placement with right ventricular perforation, bilateral hip replacement  Followed by oncology and advanced CHF clinic   She had sinus surgery on 11/1- seen by Dr Suszanne Conners for follow-up on 11/8-  Procedure: Nasal/sinus debridement status post endoscopic sinus surgery. Indication: The patient previously underwent right endoscopic sinus surgery to treat her chronic right maxillary, ethmoid, and sphenoid sinusitis and polyposis.  Fungus balls were noted within the right sphenoid sinus.  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  The patient returns today complaining of significant nasal congestion and crusting.  She had a moderate amount of bleeding from the nasal cavities.  Currently the patient denies any facial pain, fever, or visual change.  She was seen again on 12/2- Pt notes she had aspergillis at her sinus surgery She has chest congestion now for several weeks-  She is using a topical antibiotic for her nose and sinuses/ flush She was on amox most recently after her surgery-no other recent antibiotic use No fever noted No body aches or chills, she does feel  fatigeud  Flu vaccine- done  01/09/23 Covid booster- done  01/09/23 Due to urine micro- will do today  Can update foot exam  Lab Results  Component Value Date   TSH 0.63 03/06/2022     Lab Results  Component Value Date   HGBA1C 6.9 (H) 01/01/2023    Patient Active Problem List   Diagnosis Date Noted   Chronic ethmoidal sinusitis 02/23/2023   Chronic sphenoidal sinusitis 02/23/2023   Polyp of nasal sinus 02/16/2023   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   DJD (degenerative joint disease) 12/20/2021   S/P total right hip arthroplasty 12/20/2021   Diabetes mellitus with insulin therapy (HCC) 10/24/2021   Lymphedema of breast 10/24/2021   Breast cancer (HCC) 05/11/2021   Hemothorax on left 10/02/2020   S/P evacuation of hematoma 10/02/2020   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 10/02/2020   Hypovolemic shock (HCC) 10/02/2020   Anxiety    Coronary artery disease 25% of RCA, 20% intermediate branch, 40% diagonal branch based on cardiac cath from 2022 07/22/2020   Dilated cardiomyopathy (HCC) ejection fraction 25% by echocardiogram from January 2022 04/29/2020   Congestive heart failure (HCC) 04/29/2020   Allergic rhinitis due to animal (cat) (dog) hair and dander 04/27/2020   Allergic rhinitis due to pollen 04/27/2020   Mild persistent asthma, uncomplicated 04/27/2020   PONV (postoperative nausea and vomiting)    Migraines    GERD (gastroesophageal reflux disease)    Environmental allergies    Diabetes mellitus without complication (HCC)    Depression    Colon polyps    B12 deficiency 11/27/2019  IDA (iron deficiency anemia) 11/06/2019   Recurrent infections 10/05/2018   Anxiety and depression 01/20/2018   Right upper lobe pulmonary nodule 01/10/2018   Nasal polyp, unspecified 01/10/2018   Left sided abdominal pain 12/13/2017   Impaired fasting glucose 12/12/2017   History of adenomatous polyp of colon 11/19/2017   Genetic testing 07/16/2017    Family history of breast cancer    Controlled type 2 diabetes mellitus without complication, without long-term current use of insulin (HCC) 05/28/2017   Personal history of radiation therapy 2019   Cancer (HCC) 2019   Malignant neoplasm of upper-outer quadrant of right breast in female, 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   Crohn disease (HCC) 11/29/2016   Borderline diabetes 12/17/2015   Ex-smoker 11/18/2015   Chronic venous insufficiency 09/02/2015   Symptomatic varicose veins, right 09/02/2015   Chronic maxillary sinusitis 01/26/2015   Eustachian tube dysfunction 12/04/2014   Other allergic rhinitis 09/15/2014   Dyspnea 09/15/2014   Liver hemangioma 08/24/2014   Elevated triglycerides with high cholesterol 11/26/2013   Insomnia 08/20/2013   Hypertension 08/20/2013   Ulcerative colitis (HCC) 04/20/2013   Oral herpes 04/20/2013   Hyperlipidemia 12/06/2006   HEADACHE 12/06/2006   CERVICAL CANCER, HX OF 12/06/2006   Hypothyroidism 11/29/2006   Asthma 11/29/2006   IRRITABLE BOWEL SYNDROME 11/29/2006    Past Medical History:  Diagnosis Date   Allergic rhinitis due to animal (cat) (dog) hair and dander 04/27/2020   Allergic rhinitis due to pollen 04/27/2020   Anxiety    Anxiety and depression 01/20/2018   Arthritis    Asthma    Asthma, persistent controlled 10/05/2018   B12 deficiency 11/27/2019   Borderline diabetes 12/17/2015   Breast cancer (HCC)    Cancer (HCC) 2019   right breast cancer   CERVICAL CANCER, HX OF 12/06/2006   Qualifier: Diagnosis of  By: Lovell Sheehan MD, John E    Chronic sinusitis 01/26/2015   Chronic venous insufficiency 09/02/2015   Colon polyps    Congestive heart failure (HCC) 04/29/2020   Controlled type 2 diabetes mellitus without complication, without long-term current use of insulin (HCC) 05/28/2017   Coronary artery disease 25% of RCA, 20% intermediate branch, 40% diagonal branch based on cardiac cath from 2022 07/22/2020   Crohn disease  (HCC) 11/29/2016   Depression    Diabetes mellitus without complication (HCC)    type 2   Dilated cardiomyopathy (HCC) ejection fraction 25% by echocardiogram from January 2022 04/29/2020   Dyspnea 09/15/2014   Elevated triglycerides with high cholesterol 11/26/2013   Environmental allergies    Eustachian tube dysfunction 12/04/2014   Ex-smoker 11/18/2015   Family history of breast cancer    Genetic testing 07/16/2017   Negative genetic testing on the multi-cancer panel.  The Multi-Gene Panel offered by [name] includes sequencing and/or deletion duplication testing of the following 83 genes: ALK, APC, ATM, AXIN2,BAP1,  BARD1, BLM, BMPR1A, BRCA1, BRCA2, BRIP1, CASR, CDC73, CDH1, CDK4, CDKN1B, CDKN1C, CDKN2A (p14ARF), CDKN2A (p16INK4a), CEBPA, CHEK2, CTNNA1, DICER1, DIS3L2, EGFR (c.2369C>T, p.Thr790Met variant onl   GERD (gastroesophageal reflux disease)    HEADACHE 12/06/2006   Qualifier: Diagnosis of  By: Lovell Sheehan MD, John E    Hemothorax on left 10/02/2020   History of adenomatous polyp of colon 11/19/2017   Hyperlipidemia    Hypertension    Hypothyroidism    IDA (iron deficiency anemia) 11/06/2019   Impaired fasting glucose 12/12/2017   Insomnia 08/20/2013   Irritable bowel syndrome 11/29/2006   Qualifier: Diagnosis of  By: Lovell Sheehan  MD, Jonny Ruiz E    Left sided abdominal pain 12/13/2017   Liver hemangioma 08/24/2014   Malignant neoplasm of upper-outer quadrant of right breast in female, 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   Migraines    Mild persistent asthma, uncomplicated 04/27/2020   Nasal polyp, unspecified 01/10/2018   Oral herpes 04/20/2013   Other allergic rhinitis 09/15/2014   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 10/02/2020   Personal history of radiation therapy 2019   36 radiation tx   PONV (postoperative nausea and vomiting)    scopolamine patch helps   Recurrent infections 10/05/2018   Right upper lobe pulmonary nodule 01/10/2018   Symptomatic  varicose veins, right 09/02/2015   Ulcerative colitis (HCC)    Varicose veins     Past Surgical History:  Procedure Laterality Date   BREAST BIOPSY Right 02/2017   BREAST BIOPSY Right 11/07/2019   BREAST LUMPECTOMY Right 04/24/2017   BREAST LUMPECTOMY WITH RADIOACTIVE SEED AND SENTINEL LYMPH NODE BIOPSY Right 04/24/2017   Procedure: RIGHT BREAST LUMPECTOMY WITH RADIOACTIVE SEED AND SENTINEL LYMPH NODE BIOPSY;  Surgeon: Almond Lint, MD;  Location: [facility];  Service: General;  Laterality: Right;   CORONARY ARTERY BYPASS GRAFT     crapal tunnel relase Left 05/2018   ENDOMETRIAL ABLATION  2010   Had abnormal uterine bleeding (heavy and frequent), performed in [place], menses stopped completely 2 years after   ETHMOIDECTOMY Right 02/16/2023   Procedure: RIGHT TOTAL ETHMOIDECTOMY;  Surgeon: Newman Pies, MD;  Location: [facility] OR;  Service: ENT;  Laterality: Right;   EXPLORATORY LAPAROTOMY     Had exploratory surgery at the age of 57 for abdominal pain with cyst found on her left ovary, not treated surgically   FOOT SURGERY Right 2012   GENERATOR REMOVAL  10/02/2020   Procedure: GENERATOR REMOVAL;  Surgeon: Purcell Nails, MD;  Location: [facility] OR;  Service: Thoracic;;   HAND SURGERY Right    ICD IMPLANT N/A 10/01/2020   Procedure: ICD IMPLANT;  Surgeon: Regan Lemming, MD;  Location: [facility];  Service: Cardiovascular;  Laterality: N/A;   ICD LEAD REMOVAL N/A 10/02/2020   Procedure: ICD LEAD REMOVAL;  Surgeon: Purcell Nails, MD;  Location: [facility] OR;  Service: Thoracic;  Laterality: N/A;   MAXILLARY ANTROSTOMY Right 02/16/2023   Procedure: RIGHT MAXILLARY ANTROSTOMY WITH TISSUE REMOVAL;  Surgeon: Newman Pies, MD;  Location: [facility] OR;  Service: ENT;  Laterality: Right;   MEDIASTERNOTOMY N/A 10/02/2020   Procedure: MEDIAN STERNOTOMY REPAIR PERFORATED VENTRICLE EVACUATION LEFT HEMOTHORAX;  Surgeon: Purcell Nails, MD;  Location: [facility] OR;  Service: Thoracic;  Laterality: N/A;    RE-EXCISION OF BREAST LUMPECTOMY Right 05/15/2017   Procedure: RE-EXCISION OF BREAST LUMPECTOMY;  Surgeon: Almond Lint, MD;  Location: [facility];  Service: General;  Laterality: Right;   RIGHT/LEFT HEART CATH AND CORONARY ANGIOGRAPHY N/A 06/21/2020   Procedure: RIGHT/LEFT HEART CATH AND CORONARY ANGIOGRAPHY;  Surgeon: Yvonne Kendall, MD;  Location: [facility];  Service: Cardiovascular;  Laterality: N/A;   ROBOTIC ASSISTED SALPINGO OOPHERECTOMY Bilateral 08/29/2019   Procedure: XI ROBOTIC ASSISTED SALPINGO OOPHORECTOMY;  Surgeon: [name], MD;  Location: [facility];  Service: Gynecology;  Laterality: Bilateral;   Septoplasty with turbinate reduction     SINUS ENDO WITH FUSION Right 02/16/2023   Procedure: SINUS ENDO WITH FUSION;  Surgeon: Newman Pies, MD;  Location: [facility] OR;  Service: ENT;  Laterality: Right;   SPHENOIDECTOMY Right 02/16/2023  Procedure: RIGHT SPHENOIDECTOMY WITH TISSUE REMOVAL;  Surgeon: Newman Pies, MD;  Location: [facility] OR;  Service: ENT;  Laterality: Right;   TEE WITHOUT CARDIOVERSION N/A 10/02/2020   Procedure: TRANSESOPHAGEAL ECHOCARDIOGRAM (TEE);  Surgeon: Purcell Nails, MD;  Location: [facility] OR;  Service: Thoracic;  Laterality: N/A;   TONSILLECTOMY  1980   TOTAL HIP ARTHROPLASTY Right 12/20/2021   Procedure: RIGHT TOTAL HIP ARTHROPLASTY ANTERIOR APPROACH;  Surgeon: Sheral Apley, MD;  Location: [facility] OR;  Service: Orthopedics;  Laterality: Right;   TOTAL HIP ARTHROPLASTY Left 05/23/2022   Procedure: TOTAL HIP ARTHROPLASTY ANTERIOR APPROACH;  Surgeon: Sheral Apley, MD;  Location: [facility] OR;  Service: Orthopedics;  Laterality: Left;   VEIN SURGERY     Removal Right Leg   WISDOM TOOTH EXTRACTION      Social History   Tobacco Use   Smoking status: Former    Current packs/day: 0.00    Average packs/day: 3.0 packs/day for 15.0 years (45.0 ttl pk-yrs)    Types: Cigarettes    Start date: 09/15/1978    Quit date: 09/14/1993     Years since quitting: 29.5   Smokeless tobacco: Never   Tobacco comments:    Quit 26 yrs ago  Vaping Use   Vaping status: Never Used  Substance Use Topics   Alcohol use: Yes    Alcohol/week: 1.0 - 2.0 standard drink of alcohol    Types: 1 - 2 Glasses of wine per week    Comment: occ   Drug use: No    Family History  Problem Relation Age of Onset   Arthritis Mother 75       Deceased   Breast cancer Mother    Lung cancer Mother    Hyperlipidemia Mother    Heart disease Mother    Hypertension Mother    Diabetes Mother    Crohn's disease Mother    Diabetes Maternal Grandfather    Heart disease Maternal Grandfather    Heart attack Maternal Grandfather    Leukemia Maternal Grandfather    Colitis Sister    Leukemia Cousin 3       mat cousin   Lung cancer Father    Brain cancer Father     Allergies  Allergen Reactions   Losartan Potassium Shortness Of Breath   Ace Inhibitors Cough   Aspirin     Uncoated Aspirin upsets stomach   Ceclor [Cefaclor] Hives    Has tolerated Ancef in 2022   Sulfa Antibiotics Hives and Other (See Comments)   Lisinopril Cough    Medication list has been reviewed and updated.  Current Outpatient Medications on File Prior to Visit  Medication Sig Dispense Refill   acetaminophen (TYLENOL) 500 MG tablet Take 2 tablets (1,000 mg total) by mouth every 6 (six) hours as needed for moderate pain or mild pain. 30 tablet 0   acyclovir (ZOVIRAX) 400 MG tablet Take 1 tablet (400 mg total) by mouth 3 (three) times daily as needed. 30 tablet 1   Adalimumab 80 MG/0.8ML PNKT Inject 80 mg into the skin every 14 (fourteen) days.     albuterol (VENTOLIN HFA) 108 (90 Base) MCG/ACT inhaler Inhale 2 puffs into the lungs every 6 (six) hours as needed for shortness of breath.     Azelastine HCl 0.15 % SOLN Place 2 sprays into both nostrils 2 (two) times daily.     Calcium Carb-Cholecalciferol (CALCIUM 600 + D PO) Take 1 tablet by mouth daily.     carvedilol (COREG)  12.5 MG tablet TAKE 1.5 TABLETS (18.75 MG TOTAL) BY MOUTH 2 (TWO) TIMES DAILY. 270 tablet 3   cetirizine (ZYRTEC) 10 MG tablet Take 10 mg by mouth 2 (two) times daily.     dapagliflozin propanediol (FARXIGA) 10 MG TABS tablet Take 1 tablet (10 mg total) by mouth daily before breakfast. 30 tablet 11   docusate sodium (COLACE) 100 MG capsule Take 1 capsule (100 mg total) by mouth 2 (two) times daily as needed for mild constipation or moderate constipation. 20 capsule 0   EPINEPHrine 0.3 mg/0.3 mL IJ SOAJ injection Inject 0.3 mg into the muscle as needed for anaphylaxis.     FARXIGA 10 MG TABS tablet TAKE 1 TABLET BY MOUTH DAILY BEFORE BREAKFAST. 30 tablet 11   Hyoscyamine Sulfate 0.375 MG TBCR Take 0.375 mg by mouth 2 (two) times daily as needed (stomach cramping).     icosapent Ethyl (VASCEPA) 1 g capsule Take 2 capsules (2 g total) by mouth 2 (two) times daily. 120 capsule 5   lansoprazole (PREVACID SOLUTAB) 15 MG disintegrating tablet Take 15 mg by mouth 2 (two) times daily before a meal.     levothyroxine (SYNTHROID) 75 MCG tablet TAKE 1 TABLET BY MOUTH EVERY DAY BEFORE BREAKFAST 90 tablet 1   loperamide (IMODIUM A-D) 2 MG tablet Take 2 mg by mouth 4 (four) times daily as needed for diarrhea or loose stools.     MAGNESIUM PO Take 250 mg by mouth daily.     mesalamine (LIALDA) 1.2 g EC tablet Take 2.4 g by mouth in the morning and at bedtime.      metFORMIN (GLUCOPHAGE) 500 MG tablet TAKE 1 TABLET BY MOUTH TWICE A DAY WITH FOOD 180 tablet 3   montelukast (SINGULAIR) 10 MG tablet Take 10 mg by mouth at bedtime.      Multiple Vitamin (MULTIVITAMIN WITH MINERALS) TABS tablet Take 1 tablet by mouth daily.     promethazine (PHENERGAN) 12.5 MG tablet Take 1 tablet (12.5 mg total) by mouth every 6 (six) hours as needed for nausea or vomiting. 30 tablet 0   rosuvastatin (CRESTOR) 20 MG tablet Take 1 tablet (20 mg total) by mouth daily. 90 tablet 1   sacubitril-valsartan (ENTRESTO) 97-103 MG Take 1 tablet  by mouth 2 (two) times daily. 60 tablet 11   spironolactone (ALDACTONE) 25 MG tablet TAKE 1 TABLET (25 MG TOTAL) BY MOUTH DAILY. 90 tablet 3   SYMBICORT 160-4.5 MCG/ACT inhaler INHALE 2 PUFFS INTO THE LUNGS TWICE A DAY 30.6 each 1   triamcinolone (NASACORT) 55 MCG/ACT AERO nasal inhaler Place 2 sprays into the nose daily.     venlafaxine XR (EFFEXOR-XR) 150 MG 24 hr capsule Take 1 capsule (150 mg total) by mouth daily with breakfast. 90 capsule 1   zinc gluconate 50 MG tablet Take 50 mg by mouth daily.     zolpidem (AMBIEN) 5 MG tablet TAKE 1 TABLET BY MOUTH EVERY DAY AT BEDTIME AS NEEDED FOR SLEEP STRENGTH: 5 MG 30 tablet 3   No current facility-administered medications on file prior to visit.    Review of Systems:  As per HPI- otherwise negative.   Physical Examination: Vitals:   03/28/23 0941  BP: 120/72  Pulse: 82  Resp: 18  Temp: 98 F (36.7 C)  SpO2: 97%   Vitals:   03/28/23 0941  Weight: 166 lb 6.4 oz (75.5 kg)  Height: 5\' 3"  (1.6 m)   Body mass index is 29.48 kg/m. Ideal Body Weight: Weight in (  lb) to have BMI = 25: 140.8  GEN: no acute distress.  Overweight, looks well HEENT: Atraumatic, Normocephalic.  Bilateral TM wnl, oropharynx normal.  PEERL,EOMI.   Ears and Nose: No external deformity. CV: RRR, No M/G/R. No JVD. No thrill. No extra heart sounds. PULM: CTA B, no wheezes, crackles, rhonchi. No retractions. No resp. distress. No accessory muscle use. ABD: S, NT, ND, +BS. No rebound. No HSM. EXTR: No c/c/e PSYCH: Normally interactive. Conversant.  Normal foot exam today   Assessment and Plan: Acute bronchitis, unspecified organism - Plan: doxycycline (VIBRAMYCIN) 100 MG capsule  Controlled type 2 diabetes mellitus without complication, without long-term current use of insulin (HCC) - Plan: Microalbumin / creatinine urine ratio, Basic metabolic panel  Thyroid disorder screening - Plan: TSH  Patient seen today with concern of likely acute bronchitis.  As  above she recently underwent sinus surgery.  She was noted to have a fungal component of her sinus infection, this is being treated with sinus rinses.  At this time however her symptoms are more chest congestion and productive cough.  She does have a few different drug allergies and notes a previous poor response to azithromycin.  As such we will treat her with doxycycline twice a day for 10 days  Follow-up on diabetes control today, check TSH  Signed Abbe Amsterdam, MD  Received labs as below, message to patienti  Results for orders placed or performed in visit on 03/28/23  Microalbumin / creatinine urine ratio  Result Value Ref Range   Microalb, Ur 0.8 0.0 - 1.9 mg/dL   Creatinine,U 82.9 mg/dL   Microalb Creat Ratio 5.3 0.0 - 30.0 mg/g  Basic metabolic panel  Result Value Ref Range   Sodium 136 135 - 145 mEq/L   Potassium 4.9 3.5 - 5.1 mEq/L   Chloride 100 96 - 112 mEq/L   CO2 26 19 - 32 mEq/L   Glucose, Bld 141 (H) 70 - 99 mg/dL   BUN 9 6 - 23 mg/dL   Creatinine, Ser 5.62 0.40 - 1.20 mg/dL   GFR 13.08 >65.78 mL/min   Calcium 10.1 8.4 - 10.5 mg/dL  TSH  Result Value Ref Range   TSH 1.75 0.35 - 5.50 uIU/mL

## 2023-03-28 ENCOUNTER — Encounter: Payer: Self-pay | Admitting: Family Medicine

## 2023-03-28 ENCOUNTER — Ambulatory Visit: Payer: BC Managed Care – PPO | Admitting: Family Medicine

## 2023-03-28 VITALS — BP 120/72 | HR 82 | Temp 98.0°F | Resp 18 | Ht 63.0 in | Wt 166.4 lb

## 2023-03-28 DIAGNOSIS — J209 Acute bronchitis, unspecified: Secondary | ICD-10-CM

## 2023-03-28 DIAGNOSIS — Z1329 Encounter for screening for other suspected endocrine disorder: Secondary | ICD-10-CM | POA: Diagnosis not present

## 2023-03-28 DIAGNOSIS — E119 Type 2 diabetes mellitus without complications: Secondary | ICD-10-CM | POA: Diagnosis not present

## 2023-03-28 LAB — BASIC METABOLIC PANEL
BUN: 9 mg/dL (ref 6–23)
CO2: 26 meq/L (ref 19–32)
Calcium: 10.1 mg/dL (ref 8.4–10.5)
Chloride: 100 meq/L (ref 96–112)
Creatinine, Ser: 0.64 mg/dL (ref 0.40–1.20)
GFR: 96.39 mL/min (ref >60.00)
Glucose, Bld: 141 mg/dL — ABNORMAL HIGH (ref 70–99)
Potassium: 4.9 meq/L (ref 3.5–5.1)
Sodium: 136 meq/L (ref 135–145)

## 2023-03-28 LAB — MICROALBUMIN / CREATININE URINE RATIO
Creatinine,U: 15.6 mg/dL
Microalb Creat Ratio: 5.3 mg/g (ref 0.0–30.0)
Microalb, Ur: 0.8 mg/dL (ref 0.0–1.9)

## 2023-03-28 LAB — TSH: TSH: 1.75 u[IU]/mL (ref 0.35–5.50)

## 2023-03-28 MED ORDER — DOXYCYCLINE HYCLATE 100 MG PO CAPS: 100.0000 mg | ORAL_CAPSULE | Freq: Two times a day (BID) | ORAL | 0 refills | Status: DC

## 2023-03-28 NOTE — Patient Instructions (Signed)
It was good to see you today, I will be in touch with your urine protein test to soon as possible.  We will try course of doxycycline for your chest congestion.  Please let me know if this fails to clear things up, if you continue to have symptoms I would like to get a CT scan of your chest

## 2023-04-04 ENCOUNTER — Encounter: Payer: Self-pay | Admitting: Family Medicine

## 2023-04-04 DIAGNOSIS — J209 Acute bronchitis, unspecified: Secondary | ICD-10-CM

## 2023-04-04 MED ORDER — PREDNISONE 20 MG PO TABS: ORAL_TABLET | ORAL | 0 refills | Status: DC

## 2023-04-04 NOTE — Addendum Note (Signed)
Addended by: Abbe Amsterdam C on: 04/04/2023 10:21 AM   Modules accepted: Orders

## 2023-04-04 NOTE — Telephone Encounter (Signed)
Pt was seen on 03/28/23 for acute bronchitis. She was treated with doxycycline twice a day for 10 days. Please advise.

## 2023-04-10 ENCOUNTER — Other Ambulatory Visit: Payer: Self-pay | Admitting: Family Medicine

## 2023-04-10 DIAGNOSIS — E034 Atrophy of thyroid (acquired): Secondary | ICD-10-CM

## 2023-04-12 ENCOUNTER — Encounter (INDEPENDENT_AMBULATORY_CARE_PROVIDER_SITE_OTHER): Payer: Self-pay

## 2023-04-12 ENCOUNTER — Ambulatory Visit (INDEPENDENT_AMBULATORY_CARE_PROVIDER_SITE_OTHER): Payer: BC Managed Care – PPO | Admitting: Otolaryngology

## 2023-04-12 VITALS — BP 112/71 | HR 84 | Ht 63.0 in | Wt 170.0 lb

## 2023-04-12 DIAGNOSIS — J320 Chronic maxillary sinusitis: Secondary | ICD-10-CM

## 2023-04-12 DIAGNOSIS — J338 Other polyp of sinus: Secondary | ICD-10-CM

## 2023-04-12 DIAGNOSIS — J323 Chronic sphenoidal sinusitis: Secondary | ICD-10-CM | POA: Diagnosis not present

## 2023-04-12 DIAGNOSIS — J322 Chronic ethmoidal sinusitis: Secondary | ICD-10-CM | POA: Diagnosis not present

## 2023-04-12 NOTE — Progress Notes (Signed)
Patient ID: Erin Fields, female   DOB: [birth year], 57 y.o.   MRN: [id number]  Procedure: Right sided nasal/sinus debridement status post endoscopic sinus surgery.  Indication: The patient previously underwent right endoscopic sinus surgery to treat her chronic right maxillary, ethmoid, and sphenoid sinusitis and polyposis on February 16, 2023.  Fungus balls were noted within the right sphenoid sinus.  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  The patient was treated with medicated (mupirocin/budesonide/amphotericin B) nasal rinse.  The patient returns today reporting improvement in her right nasal crusting.  She had no significant bleeding from the nasal cavities.  Currently the patient denies any facial pain, fever, or visual change.   Anesthesia: Topical Xylocaine and Neo-Synephrine.  Description: The patient is placed upright in the exam chair.  The right nasal cavity is sprayed with topical Xylocaine and Neo-Synephrine.  A 0 rigid endoscope is used for the examination. The scope is advanced past the right nostril into the right nasal cavity. A small amount of crusting is this noted within the right nasal cavity and the maxillary/ethmoid cavities.  The crusting is removed with suction catheters and alligator forceps, which are inserted in parallel with the rigid endoscope.  After the debridement procedure, the maxillary antrum and the ethmoid cavities are noted to be widely patent.  The sphenoid opening is also patent.  The patient tolerated the procedure well.   Follow up care: The patient is instructed to perform medicated (mupirocin/budesonide/amphotericin B) nasal rinse weekly.  The patient will return for re-evaluation in 6 months.

## 2023-04-16 ENCOUNTER — Encounter: Payer: Self-pay | Admitting: Family

## 2023-04-26 ENCOUNTER — Other Ambulatory Visit: Payer: Self-pay | Admitting: Family Medicine

## 2023-04-26 DIAGNOSIS — F430 Acute stress reaction: Secondary | ICD-10-CM

## 2023-04-30 ENCOUNTER — Encounter: Payer: Self-pay | Admitting: Family

## 2023-05-01 NOTE — Progress Notes (Signed)
 [facility] at [facility] [address] [place], [place] [address] [phone]  Date:  05/02/2023   Name:  Erin Fields   DOB:  03-18-1966   MRN:  [id number]  PCP:  Kaylee Partridge, MD    Chief Complaint: Cough (And congestion x 1 week. Tried Dayquil. /Concerns/ questions: red spots on the face.)   History of Present Illness:  Erin Fields is a 58 y.o. very pleasant female patient who presents with the following:  Patient seen today with concern of cough and congestion  history significant for right-sided breast cancer, type 2 diabetes, ulcerative colitis on Humira  ,nonobstructive coronary artery disease, hypertension, hyperlipidemia, previous history of cardiomyopathy and ICD placement with right ventricular perforation, bilateral hip replacement  Followed by oncology and advanced CHF clinic    She had sinus surgery on 11/1- seen by Dr Darlin Ehrlich for follow-up on 11/8-  Procedure: Nasal/sinus debridement status post endoscopic sinus surgery. Indication: The patient previously underwent right endoscopic sinus surgery to treat her chronic right maxillary, ethmoid, and sphenoid sinusitis and polyposis.  Fungus balls were noted within the right sphenoid sinus.  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  The patient returns today complaining of significant nasal congestion and crusting.  She had a moderate amount of bleeding from the nasal cavities.  Currently the patient denies any facial pain, fever, or visual change.   Most recent visit with myself was about 1 month ago for acute bronchitis-at that time treated her with doxycycline  and did some routine blood work including BMP, TSH  She was seen by her ENT surgeon, Dr. Darlin Ehrlich for recheck on 12/26-it looks like her sinuses were debrided and she continued topical treatment for Aspergillus fungal infection She went back to work 10 days ago and thinks she might have picked up an  illness- she got sick about a week ago  Pt notes she did get well from her cough, but then she went back to cough, ST, left ear congested No fever noted  No vomiting She does not think flu  Lab Results  Component Value Date   HGBA1C 6.9 (H) 01/01/2023     Patient Active Problem List   Diagnosis Date Noted   Chronic ethmoidal sinusitis 02/23/2023   Chronic sphenoidal sinusitis 02/23/2023   Polyp of nasal sinus 02/16/2023   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   DJD (degenerative joint disease) 12/20/2021   S/P total right hip arthroplasty 12/20/2021   Diabetes mellitus with insulin  therapy (HCC) 10/24/2021   Lymphedema of breast 10/24/2021   Breast cancer (HCC) 05/11/2021   Hemothorax on left 10/02/2020   S/P evacuation of hematoma 10/02/2020   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 10/02/2020   Hypovolemic shock (HCC) 10/02/2020   Anxiety    Coronary artery disease 25% of RCA, 20% intermediate branch, 40% diagonal branch based on cardiac cath from 2022 07/22/2020   Dilated cardiomyopathy (HCC) ejection fraction 25% by echocardiogram from January 2022 04/29/2020   Congestive heart failure (HCC) 04/29/2020   Allergic rhinitis due to animal (cat) (dog) hair and dander 04/27/2020   Allergic rhinitis due to pollen 04/27/2020   Mild persistent asthma, uncomplicated 04/27/2020   PONV (postoperative nausea and vomiting)    Migraines    GERD (gastroesophageal reflux disease)    Environmental allergies    Diabetes mellitus without complication (HCC)    Depression    Colon polyps    B12 deficiency 11/27/2019  IDA (iron  deficiency anemia) 11/06/2019   Recurrent infections 10/05/2018   Anxiety and depression 01/20/2018   Right upper lobe pulmonary nodule 01/10/2018   Nasal polyp, unspecified 01/10/2018   Left sided abdominal pain 12/13/2017   Impaired fasting glucose 12/12/2017   History of adenomatous polyp of colon 11/19/2017   Genetic testing 07/16/2017    Family history of breast cancer    Controlled type 2 diabetes mellitus without complication, without long-term current use of insulin  (HCC) 05/28/2017   Personal history of radiation therapy 2019   Cancer (HCC) 2019   Malignant neoplasm of upper-outer quadrant of right breast in female, 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   Crohn disease (HCC) 11/29/2016   Borderline diabetes 12/17/2015   Ex-smoker 11/18/2015   Chronic venous insufficiency 09/02/2015   Symptomatic varicose veins, right 09/02/2015   Chronic maxillary sinusitis 01/26/2015   Eustachian tube dysfunction 12/04/2014   Other allergic rhinitis 09/15/2014   Dyspnea 09/15/2014   Liver hemangioma 08/24/2014   Elevated triglycerides with high cholesterol 11/26/2013   Insomnia 08/20/2013   Hypertension 08/20/2013   Ulcerative colitis (HCC) 04/20/2013   Oral herpes 04/20/2013   Hyperlipidemia 12/06/2006   HEADACHE 12/06/2006   CERVICAL CANCER, HX OF 12/06/2006   Hypothyroidism 11/29/2006   Asthma 11/29/2006   IRRITABLE BOWEL SYNDROME 11/29/2006    Past Medical History:  Diagnosis Date   Allergic rhinitis due to animal (cat) (dog) hair and dander 04/27/2020   Allergic rhinitis due to pollen 04/27/2020   Anxiety    Anxiety and depression 01/20/2018   Arthritis    Asthma    Asthma, persistent controlled 10/05/2018   B12 deficiency 11/27/2019   Borderline diabetes 12/17/2015   Breast cancer (HCC)    Cancer (HCC) 2019   right breast cancer   CERVICAL CANCER, HX OF 12/06/2006   Qualifier: Diagnosis of  By: Larrie Po MD, John E    Chronic sinusitis 01/26/2015   Chronic venous insufficiency 09/02/2015   Colon polyps    Congestive heart failure (HCC) 04/29/2020   Controlled type 2 diabetes mellitus without complication, without long-term current use of insulin  (HCC) 05/28/2017   Coronary artery disease 25% of RCA, 20% intermediate branch, 40% diagonal branch based on cardiac cath from 2022 07/22/2020   Crohn disease  (HCC) 11/29/2016   Depression    Diabetes mellitus without complication (HCC)    type 2   Dilated cardiomyopathy (HCC) ejection fraction 25% by echocardiogram from January 2022 04/29/2020   Dyspnea 09/15/2014   Elevated triglycerides with high cholesterol 11/26/2013   Environmental allergies    Eustachian tube dysfunction 12/04/2014   Ex-smoker 11/18/2015   Family history of breast cancer    Genetic testing 07/16/2017   Negative genetic testing on the multi-cancer panel.  The Multi-Gene Panel offered by [name] includes sequencing and/or deletion duplication testing of the following 83 genes: ALK, APC, ATM, AXIN2,BAP1,  BARD1, BLM, BMPR1A, BRCA1, BRCA2, BRIP1, CASR, CDC73, CDH1, CDK4, CDKN1B, CDKN1C, CDKN2A (p14ARF), CDKN2A (p16INK4a), CEBPA, CHEK2, CTNNA1, DICER1, DIS3L2, EGFR (c.2369C>T, p.Thr790Met variant onl   GERD (gastroesophageal reflux disease)    HEADACHE 12/06/2006   Qualifier: Diagnosis of  By: Larrie Po MD, John E    Hemothorax on left 10/02/2020   History of adenomatous polyp of colon 11/19/2017   Hyperlipidemia    Hypertension    Hypothyroidism    IDA (iron  deficiency anemia) 11/06/2019   Impaired fasting glucose 12/12/2017   Insomnia 08/20/2013   Irritable bowel syndrome 11/29/2006   Qualifier: Diagnosis of  By: Larrie Po  MD, Autry Legions E    Left sided abdominal pain 12/13/2017   Liver hemangioma 08/24/2014   Malignant neoplasm of upper-outer quadrant of right breast in female, 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   Migraines    Mild persistent asthma, uncomplicated 04/27/2020   Nasal polyp, unspecified 01/10/2018   Oral herpes 04/20/2013   Other allergic rhinitis 09/15/2014   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 10/02/2020   Personal history of radiation therapy 2019   36 radiation tx   PONV (postoperative nausea and vomiting)    scopolamine  patch helps   Recurrent infections 10/05/2018   Right upper lobe pulmonary nodule 01/10/2018   Symptomatic  varicose veins, right 09/02/2015   Ulcerative colitis (HCC)    Varicose veins     Past Surgical History:  Procedure Laterality Date   BREAST BIOPSY Right 02/2017   BREAST BIOPSY Right 11/07/2019   BREAST LUMPECTOMY Right 04/24/2017   BREAST LUMPECTOMY WITH RADIOACTIVE SEED AND SENTINEL LYMPH NODE BIOPSY Right 04/24/2017   Procedure: RIGHT BREAST LUMPECTOMY WITH RADIOACTIVE SEED AND SENTINEL LYMPH NODE BIOPSY;  Surgeon: Lockie Rima, MD;  Location: [facility];  Service: General;  Laterality: Right;   CORONARY ARTERY BYPASS GRAFT     crapal tunnel relase Left 05/2018   ENDOMETRIAL ABLATION  2010   Had abnormal uterine bleeding (heavy and frequent), performed in [place], menses stopped completely 2 years after   ETHMOIDECTOMY Right 02/16/2023   Procedure: RIGHT TOTAL ETHMOIDECTOMY;  Surgeon: Reynold Caves, MD;  Location: [facility] OR;  Service: ENT;  Laterality: Right;   EXPLORATORY LAPAROTOMY     Had exploratory surgery at the age of 59 for abdominal pain with cyst found on her left ovary, not treated surgically   FOOT SURGERY Right 2012   GENERATOR REMOVAL  10/02/2020   Procedure: GENERATOR REMOVAL;  Surgeon: Gardenia Jump, MD;  Location: [facility] OR;  Service: Thoracic;;   HAND SURGERY Right    ICD IMPLANT N/A 10/01/2020   Procedure: ICD IMPLANT;  Surgeon: Lei Pump, MD;  Location: [facility];  Service: Cardiovascular;  Laterality: N/A;   ICD LEAD REMOVAL N/A 10/02/2020   Procedure: ICD LEAD REMOVAL;  Surgeon: Gardenia Jump, MD;  Location: [facility] OR;  Service: Thoracic;  Laterality: N/A;   MAXILLARY ANTROSTOMY Right 02/16/2023   Procedure: RIGHT MAXILLARY ANTROSTOMY WITH TISSUE REMOVAL;  Surgeon: Reynold Caves, MD;  Location: [facility] OR;  Service: ENT;  Laterality: Right;   MEDIASTERNOTOMY N/A 10/02/2020   Procedure: MEDIAN STERNOTOMY REPAIR PERFORATED VENTRICLE EVACUATION LEFT HEMOTHORAX;  Surgeon: Gardenia Jump, MD;  Location: [facility] OR;  Service: Thoracic;  Laterality: N/A;    RE-EXCISION OF BREAST LUMPECTOMY Right 05/15/2017   Procedure: RE-EXCISION OF BREAST LUMPECTOMY;  Surgeon: Lockie Rima, MD;  Location: [facility];  Service: General;  Laterality: Right;   RIGHT/LEFT HEART CATH AND CORONARY ANGIOGRAPHY N/A 06/21/2020   Procedure: RIGHT/LEFT HEART CATH AND CORONARY ANGIOGRAPHY;  Surgeon: Sammy Crisp, MD;  Location: [facility];  Service: Cardiovascular;  Laterality: N/A;   ROBOTIC ASSISTED SALPINGO OOPHERECTOMY Bilateral 08/29/2019   Procedure: XI ROBOTIC ASSISTED SALPINGO OOPHORECTOMY;  Surgeon: Suzi Essex, MD;  Location: [facility];  Service: Gynecology;  Laterality: Bilateral;   Septoplasty with turbinate reduction     SINUS ENDO WITH FUSION Right 02/16/2023   Procedure: SINUS ENDO WITH FUSION;  Surgeon: Reynold Caves, MD;  Location: [facility] OR;  Service: ENT;  Laterality: Right;   SPHENOIDECTOMY Right 02/16/2023  Procedure: RIGHT SPHENOIDECTOMY WITH TISSUE REMOVAL;  Surgeon: Reynold Caves, MD;  Location: [facility] OR;  Service: ENT;  Laterality: Right;   TEE WITHOUT CARDIOVERSION N/A 10/02/2020   Procedure: TRANSESOPHAGEAL ECHOCARDIOGRAM (TEE);  Surgeon: Gardenia Jump, MD;  Location: [facility] OR;  Service: Thoracic;  Laterality: N/A;   TONSILLECTOMY  1980   TOTAL HIP ARTHROPLASTY Right 12/20/2021   Procedure: RIGHT TOTAL HIP ARTHROPLASTY ANTERIOR APPROACH;  Surgeon: Saundra Curl, MD;  Location: [facility] OR;  Service: Orthopedics;  Laterality: Right;   TOTAL HIP ARTHROPLASTY Left 05/23/2022   Procedure: TOTAL HIP ARTHROPLASTY ANTERIOR APPROACH;  Surgeon: Saundra Curl, MD;  Location: [facility] OR;  Service: Orthopedics;  Laterality: Left;   VEIN SURGERY     Removal Right Leg   WISDOM TOOTH EXTRACTION      Social History   Tobacco Use   Smoking status: Former    Current packs/day: 0.00    Average packs/day: 3.0 packs/day for 15.0 years (45.0 ttl pk-yrs)    Types: Cigarettes    Start date: 09/15/1978    Quit date: 09/14/1993     Years since quitting: 29.6   Smokeless tobacco: Never   Tobacco comments:    Quit 26 yrs ago  Vaping Use   Vaping status: Never Used  Substance Use Topics   Alcohol use: Yes    Alcohol/week: 1.0 - 2.0 standard drink of alcohol    Types: 1 - 2 Glasses of wine per week    Comment: occ   Drug use: No    Family History  Problem Relation Age of Onset   Arthritis Mother 90       Deceased   Breast cancer Mother    Lung cancer Mother    Hyperlipidemia Mother    Heart disease Mother    Hypertension Mother    Diabetes Mother    Crohn's disease Mother    Diabetes Maternal Grandfather    Heart disease Maternal Grandfather    Heart attack Maternal Grandfather    Leukemia Maternal Grandfather    Colitis Sister    Leukemia Cousin 3       mat cousin   Lung cancer Father    Brain cancer Father     Allergies  Allergen Reactions   Losartan Potassium Shortness Of Breath   Ace Inhibitors Cough   Aspirin      Uncoated Aspirin  upsets stomach   Ceclor [Cefaclor] Hives    Has tolerated Ancef  in 2022   Sulfa Antibiotics Hives and Other (See Comments)   Lisinopril Cough    Medication list has been reviewed and updated.  Current Outpatient Medications on File Prior to Visit  Medication Sig Dispense Refill   acetaminophen  (TYLENOL ) 500 MG tablet Take 2 tablets (1,000 mg total) by mouth every 6 (six) hours as needed for moderate pain or mild pain. 30 tablet 0   acyclovir  (ZOVIRAX ) 400 MG tablet Take 1 tablet (400 mg total) by mouth 3 (three) times daily as needed. 30 tablet 1   Adalimumab  80 MG/0.8ML PNKT Inject 80 mg into the skin every 14 (fourteen) days.     albuterol  (VENTOLIN  HFA) 108 (90 Base) MCG/ACT inhaler Inhale 2 puffs into the lungs every 6 (six) hours as needed for shortness of breath.     Azelastine  HCl 0.15 % SOLN Place 2 sprays into both nostrils 2 (two) times daily.     Calcium  Carb-Cholecalciferol (CALCIUM  600 + D PO) Take 1 tablet by mouth daily.     carvedilol  (COREG )  12.5 MG tablet TAKE 1.5 TABLETS (18.75 MG TOTAL) BY MOUTH 2 (TWO) TIMES DAILY. 270 tablet 3   cetirizine  (ZYRTEC ) 10 MG tablet Take 10 mg by mouth 2 (two) times daily.     dapagliflozin  propanediol (FARXIGA ) 10 MG TABS tablet Take 1 tablet (10 mg total) by mouth daily before breakfast. 30 tablet 11   docusate sodium  (COLACE) 100 MG capsule Take 1 capsule (100 mg total) by mouth 2 (two) times daily as needed for mild constipation or moderate constipation. 20 capsule 0   EPINEPHrine  0.3 mg/0.3 mL IJ SOAJ injection Inject 0.3 mg into the muscle as needed for anaphylaxis.     FARXIGA  10 MG TABS tablet TAKE 1 TABLET BY MOUTH DAILY BEFORE BREAKFAST. 30 tablet 11   Hyoscyamine  Sulfate 0.375 MG TBCR Take 0.375 mg by mouth 2 (two) times daily as needed (stomach cramping).     icosapent  Ethyl (VASCEPA ) 1 g capsule Take 2 capsules (2 g total) by mouth 2 (two) times daily. 120 capsule 5   lansoprazole (PREVACID SOLUTAB) 15 MG disintegrating tablet Take 15 mg by mouth 2 (two) times daily before a meal.     levothyroxine  (SYNTHROID ) 75 MCG tablet Take 1 tablet (75 mcg total) by mouth daily before breakfast. 90 tablet 1   loperamide (IMODIUM A-D) 2 MG tablet Take 2 mg by mouth 4 (four) times daily as needed for diarrhea or loose stools.     MAGNESIUM  PO Take 250 mg by mouth daily.     mesalamine  (LIALDA ) 1.2 g EC tablet Take 2.4 g by mouth in the morning and at bedtime.      metFORMIN  (GLUCOPHAGE ) 500 MG tablet TAKE 1 TABLET BY MOUTH TWICE A DAY WITH FOOD 180 tablet 3   montelukast  (SINGULAIR ) 10 MG tablet Take 10 mg by mouth at bedtime.      Multiple Vitamin (MULTIVITAMIN WITH MINERALS) TABS tablet Take 1 tablet by mouth daily.     promethazine  (PHENERGAN ) 12.5 MG tablet Take 1 tablet (12.5 mg total) by mouth every 6 (six) hours as needed for nausea or vomiting. 30 tablet 0   rosuvastatin  (CRESTOR ) 20 MG tablet Take 1 tablet (20 mg total) by mouth daily. 90 tablet 1   sacubitril -valsartan  (ENTRESTO ) 97-103 MG  Take 1 tablet by mouth 2 (two) times daily. 60 tablet 11   spironolactone  (ALDACTONE ) 25 MG tablet TAKE 1 TABLET (25 MG TOTAL) BY MOUTH DAILY. 90 tablet 3   SYMBICORT  160-4.5 MCG/ACT inhaler INHALE 2 PUFFS INTO THE LUNGS TWICE A DAY 30.6 each 1   triamcinolone  (NASACORT ) 55 MCG/ACT AERO nasal inhaler Place 2 sprays into the nose daily.     venlafaxine  XR (EFFEXOR -XR) 150 MG 24 hr capsule Take 1 capsule (150 mg total) by mouth daily with breakfast. 90 capsule 0   zinc  gluconate 50 MG tablet Take 50 mg by mouth daily.     zolpidem  (AMBIEN ) 5 MG tablet TAKE 1 TABLET BY MOUTH EVERY DAY AT BEDTIME AS NEEDED FOR SLEEP STRENGTH: 5 MG 30 tablet 3   No current facility-administered medications on file prior to visit.    Review of Systems:  As per HPI- otherwise negative.   Physical Examination: Vitals:   05/02/23 1548  BP: 120/60  Pulse: 76  Resp: 18  Temp: 97.9 F (36.6 C)  SpO2: 97%   Vitals:   05/02/23 1548  Weight: 170 lb (77.1 kg)  Height: 5' 3.5" (1.613 m)   Body mass index is 29.64 kg/m. Ideal Body Weight: Weight in (  lb) to have BMI = 25: 143.1  GEN: no acute distress.  Overweight, looks well HEENT: Atraumatic, Normocephalic. Bilateral TM wnl, oropharynx normal.  PEERL,EOMI.   Nasal cavity is red and inflamed bilaterally Patient points out a couple of nonspecific skin lesions on her cheeks, appear most consistent with some type of acne or other superficial infection Ears and Nose: No external deformity. CV: RRR, No M/G/R. No JVD. No thrill. No extra heart sounds. PULM: CTA B, no wheezes, crackles, rhonchi. No retractions. No resp. distress. No accessory muscle use. EXTR: No c/c/e PSYCH: Normally interactive. Conversant.   Results for orders placed or performed in visit on 05/02/23  POC COVID-19 BinaxNow   Collection Time: 05/02/23  4:12 PM  Result Value Ref Range   SARS Coronavirus 2 Ag Negative Negative    Assessment and Plan: Acute cough - Plan: POC COVID-19  BinaxNow, DG Chest 2 View, predniSONE  (DELTASONE ) 20 MG tablet, amoxicillin -clavulanate (AUGMENTIN ) 875-125 MG tablet  Patient seen today with cough and likely bronchitis.  She notes antibiotics and steroids are typically needed to resolve her symptoms.  She does have well-controlled diabetes and will monitor her blood sugars and diet while on steroids  I ordered a chest film which she will do if not feeling better in the next couple of days.  For now we will treat with prednisone  40 mg daily for 3 to 5 days and Augmentin   She will let me know if any concerns or if not getting better  Signed Gates Kasal, MD

## 2023-05-02 ENCOUNTER — Ambulatory Visit: Payer: 59 | Admitting: Family Medicine

## 2023-05-02 VITALS — BP 120/60 | HR 76 | Temp 97.9°F | Resp 18 | Ht 63.5 in | Wt 170.0 lb

## 2023-05-02 DIAGNOSIS — R051 Acute cough: Secondary | ICD-10-CM

## 2023-05-02 LAB — POC COVID19 BINAXNOW: SARS Coronavirus 2 Ag: NEGATIVE

## 2023-05-02 MED ORDER — PREDNISONE 20 MG PO TABS: ORAL_TABLET | ORAL | 0 refills | Status: DC

## 2023-05-02 MED ORDER — AMOXICILLIN-POT CLAVULANATE 875-125 MG PO TABS: 1.0000 | ORAL_TABLET | Freq: Two times a day (BID) | ORAL | 0 refills | Status: DC

## 2023-05-02 NOTE — Patient Instructions (Signed)
 It was good to see you today- I ordered a chest film for you, you can do today or see how things go the next few days if you prefer For current symptoms please take augmentin  for 10 days, prednisone  for 3-5 days

## 2023-05-07 ENCOUNTER — Encounter: Payer: Self-pay | Admitting: Family Medicine

## 2023-05-07 ENCOUNTER — Ambulatory Visit (HOSPITAL_BASED_OUTPATIENT_CLINIC_OR_DEPARTMENT_OTHER)
Admission: RE | Admit: 2023-05-07 | Discharge: 2023-05-07 | Disposition: A | Discharge status: Home / Self Care | Payer: 59 | Source: Ambulatory Visit | Attending: Family Medicine | Admitting: Family Medicine

## 2023-05-07 DIAGNOSIS — B49 Unspecified mycosis: Secondary | ICD-10-CM

## 2023-05-07 DIAGNOSIS — R051 Acute cough: Secondary | ICD-10-CM | POA: Insufficient documentation

## 2023-05-07 DIAGNOSIS — R053 Chronic cough: Secondary | ICD-10-CM

## 2023-05-15 NOTE — Progress Notes (Deleted)
[facility] [address] [place], [place] [address] [phone]  Date:  05/16/2023   Name:  Erin Fields   DOB:  1965-12-30   MRN:  [id number]  PCP:  Pearline Cables, MD    Chief Complaint: No chief complaint on file.   History of Present Illness:  Erin Fields is a 58 y.o. very pleasant female patient who presents with the following:  Patient seen today for physical exam- most recent visit with myself   Patient Active Problem List   Diagnosis Date Noted   Chronic ethmoidal sinusitis 02/23/2023   Chronic sphenoidal sinusitis 02/23/2023   Polyp of nasal sinus 02/16/2023   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   DJD (degenerative joint disease) 12/20/2021   S/P total right hip arthroplasty 12/20/2021   Diabetes mellitus with insulin therapy (HCC) 10/24/2021   Lymphedema of breast 10/24/2021   Breast cancer (HCC) 05/11/2021   Hemothorax on left 10/02/2020   S/P evacuation of hematoma 10/02/2020   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 10/02/2020   Hypovolemic shock (HCC) 10/02/2020   Anxiety    Coronary artery disease 25% of RCA, 20% intermediate branch, 40% diagonal branch based on cardiac cath from 2022 07/22/2020   Dilated cardiomyopathy (HCC) ejection fraction 25% by echocardiogram from January 2022 04/29/2020   Congestive heart failure (HCC) 04/29/2020   Allergic rhinitis due to animal (cat) (dog) hair and dander 04/27/2020   Allergic rhinitis due to pollen 04/27/2020   Mild persistent asthma, uncomplicated 04/27/2020   PONV (postoperative nausea and vomiting)    Migraines    GERD (gastroesophageal reflux disease)    Environmental allergies    Diabetes mellitus without complication (HCC)    Depression    Colon polyps    B12 deficiency 11/27/2019   IDA (iron deficiency anemia) 11/06/2019   Recurrent infections 10/05/2018   Anxiety and depression 01/20/2018   Right upper  lobe pulmonary nodule 01/10/2018   Nasal polyp, unspecified 01/10/2018   Left sided abdominal pain 12/13/2017   Impaired fasting glucose 12/12/2017   History of adenomatous polyp of colon 11/19/2017   Genetic testing 07/16/2017   Family history of breast cancer    Controlled type 2 diabetes mellitus without complication, without long-term current use of insulin (HCC) 05/28/2017   Personal history of radiation therapy 2019   Cancer (HCC) 2019   Malignant neoplasm of upper-outer quadrant of right breast in female, 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   Crohn disease (HCC) 11/29/2016   Borderline diabetes 12/17/2015   Ex-smoker 11/18/2015   Chronic venous insufficiency 09/02/2015   Symptomatic varicose veins, right 09/02/2015   Chronic maxillary sinusitis 01/26/2015   Eustachian tube dysfunction 12/04/2014   Other allergic rhinitis 09/15/2014   Dyspnea 09/15/2014   Liver hemangioma 08/24/2014   Elevated triglycerides with high cholesterol 11/26/2013   Insomnia 08/20/2013   Hypertension 08/20/2013   Ulcerative colitis (HCC) 04/20/2013   Oral herpes 04/20/2013   Hyperlipidemia 12/06/2006   HEADACHE 12/06/2006   CERVICAL CANCER, HX OF 12/06/2006   Hypothyroidism 11/29/2006   Asthma 11/29/2006   IRRITABLE BOWEL SYNDROME 11/29/2006    Past Medical History:  Diagnosis Date   Allergic rhinitis due to animal (cat) (dog) hair and dander 04/27/2020   Allergic rhinitis due to pollen 04/27/2020   Anxiety    Anxiety and depression 01/20/2018   Arthritis    Asthma    Asthma, persistent controlled 10/05/2018  B12 deficiency 11/27/2019   Borderline diabetes 12/17/2015   Breast cancer (HCC)    Cancer (HCC) 2019   right breast cancer   CERVICAL CANCER, HX OF 12/06/2006   Qualifier: Diagnosis of  By: Lovell Sheehan MD, John E    Chronic sinusitis 01/26/2015   Chronic venous insufficiency 09/02/2015   Colon polyps    Congestive heart failure (HCC) 04/29/2020   Controlled type 2  diabetes mellitus without complication, without long-term current use of insulin (HCC) 05/28/2017   Coronary artery disease 25% of RCA, 20% intermediate branch, 40% diagonal branch based on cardiac cath from 2022 07/22/2020   Crohn disease (HCC) 11/29/2016   Depression    Diabetes mellitus without complication (HCC)    type 2   Dilated cardiomyopathy (HCC) ejection fraction 25% by echocardiogram from January 2022 04/29/2020   Dyspnea 09/15/2014   Elevated triglycerides with high cholesterol 11/26/2013   Environmental allergies    Eustachian tube dysfunction 12/04/2014   Ex-smoker 11/18/2015   Family history of breast cancer    Genetic testing 07/16/2017   Negative genetic testing on the multi-cancer panel.  The Multi-Gene Panel offered by [name] includes sequencing and/or deletion duplication testing of the following 83 genes: ALK, APC, ATM, AXIN2,BAP1,  BARD1, BLM, BMPR1A, BRCA1, BRCA2, BRIP1, CASR, CDC73, CDH1, CDK4, CDKN1B, CDKN1C, CDKN2A (p14ARF), CDKN2A (p16INK4a), CEBPA, CHEK2, CTNNA1, DICER1, DIS3L2, EGFR (c.2369C>T, p.Thr790Met variant onl   GERD (gastroesophageal reflux disease)    HEADACHE 12/06/2006   Qualifier: Diagnosis of  By: Lovell Sheehan MD, John E    Hemothorax on left 10/02/2020   History of adenomatous polyp of colon 11/19/2017   Hyperlipidemia    Hypertension    Hypothyroidism    IDA (iron deficiency anemia) 11/06/2019   Impaired fasting glucose 12/12/2017   Insomnia 08/20/2013   Irritable bowel syndrome 11/29/2006   Qualifier: Diagnosis of  By: Lovell Sheehan MD, John E    Left sided abdominal pain 12/13/2017   Liver hemangioma 08/24/2014   Malignant neoplasm of upper-outer quadrant of right breast in female, 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   Migraines    Mild persistent asthma, uncomplicated 04/27/2020   Nasal polyp, unspecified 01/10/2018   Oral herpes 04/20/2013   Other allergic rhinitis 09/15/2014   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  10/02/2020   Personal history of radiation therapy 2019   36 radiation tx   PONV (postoperative nausea and vomiting)    scopolamine patch helps   Recurrent infections 10/05/2018   Right upper lobe pulmonary nodule 01/10/2018   Symptomatic varicose veins, right 09/02/2015   Ulcerative colitis (HCC)    Varicose veins     Past Surgical History:  Procedure Laterality Date   BREAST BIOPSY Right 02/2017   BREAST BIOPSY Right 11/07/2019   BREAST LUMPECTOMY Right 04/24/2017   BREAST LUMPECTOMY WITH RADIOACTIVE SEED AND SENTINEL LYMPH NODE BIOPSY Right 04/24/2017   Procedure: RIGHT BREAST LUMPECTOMY WITH RADIOACTIVE SEED AND SENTINEL LYMPH NODE BIOPSY;  Surgeon: Almond Lint, MD;  Location: [facility];  Service: General;  Laterality: Right;   CORONARY ARTERY BYPASS GRAFT     crapal tunnel relase Left 05/2018   ENDOMETRIAL ABLATION  2010   Had abnormal uterine bleeding (heavy and frequent), performed in [place], menses stopped completely 2 years after   ETHMOIDECTOMY Right 02/16/2023   Procedure: RIGHT TOTAL ETHMOIDECTOMY;  Surgeon: Newman Pies, MD;  Location: [facility] OR;  Service: ENT;  Laterality: Right;   EXPLORATORY LAPAROTOMY     Had exploratory surgery at the  age of 23 for abdominal pain with cyst found on her left ovary, not treated surgically   FOOT SURGERY Right 2012   GENERATOR REMOVAL  10/02/2020   Procedure: GENERATOR REMOVAL;  Surgeon: Purcell Nails, MD;  Location: [facility] OR;  Service: Thoracic;;   HAND SURGERY Right    ICD IMPLANT N/A 10/01/2020   Procedure: ICD IMPLANT;  Surgeon: Regan Lemming, MD;  Location: [facility] INVASIVE CV LAB;  Service: Cardiovascular;  Laterality: N/A;   ICD LEAD REMOVAL N/A 10/02/2020   Procedure: ICD LEAD REMOVAL;  Surgeon: Purcell Nails, MD;  Location: [facility] OR;  Service: Thoracic;  Laterality: N/A;   MAXILLARY ANTROSTOMY Right 02/16/2023   Procedure: RIGHT MAXILLARY ANTROSTOMY WITH TISSUE REMOVAL;  Surgeon: Newman Pies, MD;  Location: [facility] OR;   Service: ENT;  Laterality: Right;   MEDIASTERNOTOMY N/A 10/02/2020   Procedure: MEDIAN STERNOTOMY REPAIR PERFORATED VENTRICLE EVACUATION LEFT HEMOTHORAX;  Surgeon: Purcell Nails, MD;  Location: [facility] OR;  Service: Thoracic;  Laterality: N/A;   RE-EXCISION OF BREAST LUMPECTOMY Right 05/15/2017   Procedure: RE-EXCISION OF BREAST LUMPECTOMY;  Surgeon: Almond Lint, MD;  Location: [facility];  Service: General;  Laterality: Right;   RIGHT/LEFT HEART CATH AND CORONARY ANGIOGRAPHY N/A 06/21/2020   Procedure: RIGHT/LEFT HEART CATH AND CORONARY ANGIOGRAPHY;  Surgeon: Yvonne Kendall, MD;  Location: [facility] INVASIVE CV LAB;  Service: Cardiovascular;  Laterality: N/A;   ROBOTIC ASSISTED SALPINGO OOPHERECTOMY Bilateral 08/29/2019   Procedure: XI ROBOTIC ASSISTED SALPINGO OOPHORECTOMY;  Surgeon: [name], MD;  Location: [facility];  Service: Gynecology;  Laterality: Bilateral;   Septoplasty with turbinate reduction     SINUS ENDO WITH FUSION Right 02/16/2023   Procedure: SINUS ENDO WITH FUSION;  Surgeon: Newman Pies, MD;  Location: [facility] OR;  Service: ENT;  Laterality: Right;   SPHENOIDECTOMY Right 02/16/2023   Procedure: RIGHT SPHENOIDECTOMY WITH TISSUE REMOVAL;  Surgeon: Newman Pies, MD;  Location: [facility] OR;  Service: ENT;  Laterality: Right;   TEE WITHOUT CARDIOVERSION N/A 10/02/2020   Procedure: TRANSESOPHAGEAL ECHOCARDIOGRAM (TEE);  Surgeon: Purcell Nails, MD;  Location: [facility] OR;  Service: Thoracic;  Laterality: N/A;   TONSILLECTOMY  1980   TOTAL HIP ARTHROPLASTY Right 12/20/2021   Procedure: RIGHT TOTAL HIP ARTHROPLASTY ANTERIOR APPROACH;  Surgeon: Sheral Apley, MD;  Location: [facility] OR;  Service: Orthopedics;  Laterality: Right;   TOTAL HIP ARTHROPLASTY Left 05/23/2022   Procedure: TOTAL HIP ARTHROPLASTY ANTERIOR APPROACH;  Surgeon: Sheral Apley, MD;  Location: [facility] OR;  Service: Orthopedics;  Laterality: Left;   VEIN SURGERY     Removal Right Leg   WISDOM TOOTH  EXTRACTION      Social History   Tobacco Use   Smoking status: Former    Current packs/day: 0.00    Average packs/day: 3.0 packs/day for 15.0 years (45.0 ttl pk-yrs)    Types: Cigarettes    Start date: 09/15/1978    Quit date: 09/14/1993    Years since quitting: 29.6   Smokeless tobacco: Never   Tobacco comments:    Quit 26 yrs ago  Vaping Use   Vaping status: Never Used  Substance Use Topics   Alcohol use: Yes    Alcohol/week: 1.0 - 2.0 standard drink of alcohol    Types: 1 - 2 Glasses of wine per week    Comment: occ   Drug use: No    Family History  Problem Relation Age of Onset   Arthritis Mother 65  Deceased   Breast cancer Mother    Lung cancer Mother    Hyperlipidemia Mother    Heart disease Mother    Hypertension Mother    Diabetes Mother    Crohn's disease Mother    Diabetes Maternal Grandfather    Heart disease Maternal Grandfather    Heart attack Maternal Grandfather    Leukemia Maternal Grandfather    Colitis Sister    Leukemia Cousin 3       mat cousin   Lung cancer Father    Brain cancer Father     Allergies  Allergen Reactions   Losartan Potassium Shortness Of Breath   Ace Inhibitors Cough   Aspirin     Uncoated Aspirin upsets stomach   Ceclor [Cefaclor] Hives    Has tolerated Ancef in 2022   Sulfa Antibiotics Hives and Other (See Comments)   Lisinopril Cough    Medication list has been reviewed and updated.  Current Outpatient Medications on File Prior to Visit  Medication Sig Dispense Refill   acetaminophen (TYLENOL) 500 MG tablet Take 2 tablets (1,000 mg total) by mouth every 6 (six) hours as needed for moderate pain or mild pain. 30 tablet 0   acyclovir (ZOVIRAX) 400 MG tablet Take 1 tablet (400 mg total) by mouth 3 (three) times daily as needed. 30 tablet 1   Adalimumab 80 MG/0.8ML PNKT Inject 80 mg into the skin every 14 (fourteen) days.     albuterol (VENTOLIN HFA) 108 (90 Base) MCG/ACT inhaler Inhale 2 puffs into the lungs  every 6 (six) hours as needed for shortness of breath.     amoxicillin-clavulanate (AUGMENTIN) 875-125 MG tablet Take 1 tablet by mouth 2 (two) times daily. 20 tablet 0   Azelastine HCl 0.15 % SOLN Place 2 sprays into both nostrils 2 (two) times daily.     Calcium Carb-Cholecalciferol (CALCIUM 600 + D PO) Take 1 tablet by mouth daily.     carvedilol (COREG) 12.5 MG tablet TAKE 1.5 TABLETS (18.75 MG TOTAL) BY MOUTH 2 (TWO) TIMES DAILY. 270 tablet 3   cetirizine (ZYRTEC) 10 MG tablet Take 10 mg by mouth 2 (two) times daily.     dapagliflozin propanediol (FARXIGA) 10 MG TABS tablet Take 1 tablet (10 mg total) by mouth daily before breakfast. 30 tablet 11   docusate sodium (COLACE) 100 MG capsule Take 1 capsule (100 mg total) by mouth 2 (two) times daily as needed for mild constipation or moderate constipation. 20 capsule 0   EPINEPHrine 0.3 mg/0.3 mL IJ SOAJ injection Inject 0.3 mg into the muscle as needed for anaphylaxis.     FARXIGA 10 MG TABS tablet TAKE 1 TABLET BY MOUTH DAILY BEFORE BREAKFAST. 30 tablet 11   Hyoscyamine Sulfate 0.375 MG TBCR Take 0.375 mg by mouth 2 (two) times daily as needed (stomach cramping).     icosapent Ethyl (VASCEPA) 1 g capsule Take 2 capsules (2 g total) by mouth 2 (two) times daily. 120 capsule 5   lansoprazole (PREVACID SOLUTAB) 15 MG disintegrating tablet Take 15 mg by mouth 2 (two) times daily before a meal.     levothyroxine (SYNTHROID) 75 MCG tablet Take 1 tablet (75 mcg total) by mouth daily before breakfast. 90 tablet 1   loperamide (IMODIUM A-D) 2 MG tablet Take 2 mg by mouth 4 (four) times daily as needed for diarrhea or loose stools.     MAGNESIUM PO Take 250 mg by mouth daily.     mesalamine (LIALDA) 1.2 g EC tablet  Take 2.4 g by mouth in the morning and at bedtime.      metFORMIN (GLUCOPHAGE) 500 MG tablet TAKE 1 TABLET BY MOUTH TWICE A DAY WITH FOOD 180 tablet 3   montelukast (SINGULAIR) 10 MG tablet Take 10 mg by mouth at bedtime.      Multiple Vitamin  (MULTIVITAMIN WITH MINERALS) TABS tablet Take 1 tablet by mouth daily.     predniSONE (DELTASONE) 20 MG tablet Take 40 mg daily for 3-5 days 10 tablet 0   promethazine (PHENERGAN) 12.5 MG tablet Take 1 tablet (12.5 mg total) by mouth every 6 (six) hours as needed for nausea or vomiting. 30 tablet 0   rosuvastatin (CRESTOR) 20 MG tablet Take 1 tablet (20 mg total) by mouth daily. 90 tablet 1   sacubitril-valsartan (ENTRESTO) 97-103 MG Take 1 tablet by mouth 2 (two) times daily. 60 tablet 11   spironolactone (ALDACTONE) 25 MG tablet TAKE 1 TABLET (25 MG TOTAL) BY MOUTH DAILY. 90 tablet 3   SYMBICORT 160-4.5 MCG/ACT inhaler INHALE 2 PUFFS INTO THE LUNGS TWICE A DAY 30.6 each 1   triamcinolone (NASACORT) 55 MCG/ACT AERO nasal inhaler Place 2 sprays into the nose daily.     venlafaxine XR (EFFEXOR-XR) 150 MG 24 hr capsule Take 1 capsule (150 mg total) by mouth daily with breakfast. 90 capsule 0   zinc gluconate 50 MG tablet Take 50 mg by mouth daily.     zolpidem (AMBIEN) 5 MG tablet TAKE 1 TABLET BY MOUTH EVERY DAY AT BEDTIME AS NEEDED FOR SLEEP STRENGTH: 5 MG 30 tablet 3   No current facility-administered medications on file prior to visit.    Review of Systems:  ***  Physical Examination: There were no vitals filed for this visit. There were no vitals filed for this visit. There is no height or weight on file to calculate BMI. Ideal Body Weight:    ***  Assessment and Plan: ***  Signed Abbe Amsterdam, MD

## 2023-05-16 ENCOUNTER — Encounter: Payer: 59 | Admitting: Family Medicine

## 2023-05-23 ENCOUNTER — Ambulatory Visit (HOSPITAL_BASED_OUTPATIENT_CLINIC_OR_DEPARTMENT_OTHER)
Admission: RE | Admit: 2023-05-23 | Discharge: 2023-05-23 | Disposition: A | Discharge status: Home / Self Care | Payer: 59 | Source: Ambulatory Visit | Attending: Family Medicine | Admitting: Family Medicine

## 2023-05-23 DIAGNOSIS — R053 Chronic cough: Secondary | ICD-10-CM | POA: Diagnosis present

## 2023-05-23 DIAGNOSIS — B49 Unspecified mycosis: Secondary | ICD-10-CM | POA: Insufficient documentation

## 2023-05-23 DIAGNOSIS — J329 Chronic sinusitis, unspecified: Secondary | ICD-10-CM | POA: Insufficient documentation

## 2023-05-23 LAB — POCT I-STAT CREATININE: Creatinine, Ser: 0.6 mg/dL (ref 0.44–1.00)

## 2023-05-23 MED ORDER — IOHEXOL 300 MG/ML  SOLN
75.0000 mL | Freq: Once | INTRAMUSCULAR | Status: AC | PRN
  Administered 2023-05-23: 75 mL via INTRAVENOUS

## 2023-05-28 ENCOUNTER — Encounter: Payer: 59 | Admitting: Family Medicine

## 2023-05-29 ENCOUNTER — Encounter (HOSPITAL_COMMUNITY): Payer: BC Managed Care – PPO

## 2023-05-29 ENCOUNTER — Encounter: Payer: Self-pay | Admitting: Family Medicine

## 2023-06-01 ENCOUNTER — Telehealth (HOSPITAL_COMMUNITY): Payer: Self-pay

## 2023-06-01 NOTE — Telephone Encounter (Signed)
Called to confirm/remind patient of their appointment at [facility] on 06/04/23.   Patient reminded to bring all medications and/or complete list.  Confirmed patient has transportation. Gave directions, instructed to utilize valet parking.  Confirmed appointment prior to ending call.

## 2023-06-01 NOTE — Progress Notes (Signed)
PCP: Dr. Patsy Lager Cardiology: Dr. Bing Matter HF Cardiology: Dr. Shirlee Latch  58 y.o. with history of ulcerative colitis, HTN, and breast cancer. Patient's breast cancer was treated with surgery and radiation, no chemo.  Patient reports exertional dyspnea since 2019.  Patient has right hip OA, needs THR.  Echo was done in 1/22 as part of pre-op workup, this showed low EF 25%.  She then had RHC/LHC in 3/22 with mild nonobstructive CAD and low cardiac index 2.1.  Echo in 4/22 showed EF still 20-25%.  She was referred for ICD.  St Jude ICD was placed in 6/22 but complicated by RV perforation with hemothorax and hemorrhagic shock.  ICD was removed and RV was surgically repaired with sternotomy.  Patient's mother had CHF in her 60s, uncertain etiology.   Cardiac MRI in 9/22 showed EF 35% with moderate LV hypokinesis, RV EF 44%, small area of LGE at the true apex (?small embolic apical infarct).    Echo in 2/23 showed EF 40-45%, diffuse hypokinesis, mildly decreased RV systolic function.  Echo 10/24 showed EF 45% with septal bounce consistent with prior sternotomy, mild RV dysfunction, normal RV.   Today she returns for HF follow up. Overall feeling fine. She has SOB with housework or walking up steps. She attributes her dyspnea to her asthma. Denies palpitations, abnormal bleeding, CP, dizziness, edema, or PND/Orthopnea. She has an adjustable bed. Appetite ok. No fever or chills. Weight at home 170 pounds. Taking all medications.  She continues on Humira for ulcerative colitis. Has a lot of flares, follows with GI.  ECG (personally reviewed): none ordered today  Labs (8/23): K 4.1, creatinine 0.8 Labs (2/24): K 4.5, creatinine 0.67 Labs (7/24): K 4.5, creatinine 0.76 Labs (12/24): K 4.9, creatinine 0.64  PMH: 1.  Cervical cancer 2.  B12 deficiency 3.  Asthma 4.  Type 2 diabetes 5.  Ulcerative colitis 6.  HTN 7.  Hypothyroidism 8.  Irritable bowel syndrome 9.  Breast cancer: Right-sided, surgery and  radiation but no chemotherapy.  10.  Chronic systolic CHF: Nonischemic cardiomyopathy.  - Echo (1/22): EF 25% - LHC/RHC (3/22): Mild nonobstructive CAD; mean RA 3, PA 24/11, CI 2.1.  - Echo (4/22): EF 20-25%, mild LV dilation, normal RV.  - Echo (6/22): EF 30-35%  - St Jude ICD placed 6/22, complicated by RV perforation requiring sternotomy due to hemothorax/hemorrhagic shock, removal of ICD and surgical repair of RV perforation.  - Cardiac MRI (9/22): EF 35% with moderate LV hypokinesis, RV EF 44%, small area of LGE at the true apex (?small embolic apical infarct).   - Echo (2/23): EF 40-45%, diffuse hypokinesis, mildly decreased RV systolic function. - Echo (12/23): EF 60-65%, mild RV dysfunction.  - Echo (10/24): EF 45% with septal bounce consistent with prior sternotomy, mild RV dysfunction, normal RV.  11.  OA: s/p right and left hip THR 12.  COVID-19 12/21  FH: Grandfather with MI, mother with CHF in her 29s.    SH: Lives in [place], nonsmoker, rare ETOH.   ROS: All systems reviewed and negative except as per HPI.  Current Outpatient Medications  Medication Sig Dispense Refill   acetaminophen (TYLENOL) 500 MG tablet Take 2 tablets (1,000 mg total) by mouth every 6 (six) hours as needed for moderate pain or mild pain. 30 tablet 0   acyclovir (ZOVIRAX) 400 MG tablet Take 1 tablet (400 mg total) by mouth 3 (three) times daily as needed. 30 tablet 1   Adalimumab 80 MG/0.8ML PNKT Inject 80 mg into  the skin every 14 (fourteen) days.     albuterol (VENTOLIN HFA) 108 (90 Base) MCG/ACT inhaler Inhale 2 puffs into the lungs every 6 (six) hours as needed for shortness of breath.     Azelastine HCl 0.15 % SOLN Place 2 sprays into both nostrils 2 (two) times daily.     Calcium Carb-Cholecalciferol (CALCIUM 600 + D PO) Take 1 tablet by mouth daily.     carvedilol (COREG) 12.5 MG tablet TAKE 1.5 TABLETS (18.75 MG TOTAL) BY MOUTH 2 (TWO) TIMES DAILY. 270 tablet 3   cetirizine (ZYRTEC) 10 MG  tablet Take 10 mg by mouth 2 (two) times daily.     dapagliflozin propanediol (FARXIGA) 10 MG TABS tablet Take 1 tablet (10 mg total) by mouth daily before breakfast. 30 tablet 11   EPINEPHrine 0.3 mg/0.3 mL IJ SOAJ injection Inject 0.3 mg into the muscle as needed for anaphylaxis.     Hyoscyamine Sulfate 0.375 MG TBCR Take 0.375 mg by mouth 2 (two) times daily as needed (stomach cramping).     icosapent Ethyl (VASCEPA) 1 g capsule Take 2 capsules (2 g total) by mouth 2 (two) times daily. 120 capsule 5   lansoprazole (PREVACID SOLUTAB) 15 MG disintegrating tablet Take 15 mg by mouth 2 (two) times daily before a meal.     levothyroxine (SYNTHROID) 75 MCG tablet Take 1 tablet (75 mcg total) by mouth daily before breakfast. 90 tablet 1   loperamide (IMODIUM A-D) 2 MG tablet Take 2 mg by mouth 4 (four) times daily as needed for diarrhea or loose stools.     MAGNESIUM PO Take 250 mg by mouth daily.     mesalamine (LIALDA) 1.2 g EC tablet Take 2.4 g by mouth in the morning and at bedtime.      metFORMIN (GLUCOPHAGE) 500 MG tablet TAKE 1 TABLET BY MOUTH TWICE A DAY WITH FOOD 180 tablet 3   montelukast (SINGULAIR) 10 MG tablet Take 10 mg by mouth at bedtime.      Multiple Vitamin (MULTIVITAMIN WITH MINERALS) TABS tablet Take 1 tablet by mouth daily.     promethazine (PHENERGAN) 12.5 MG tablet Take 1 tablet (12.5 mg total) by mouth every 6 (six) hours as needed for nausea or vomiting. 30 tablet 0   rosuvastatin (CRESTOR) 20 MG tablet Take 1 tablet (20 mg total) by mouth daily. 90 tablet 1   sacubitril-valsartan (ENTRESTO) 97-103 MG Take 1 tablet by mouth 2 (two) times daily. 60 tablet 11   spironolactone (ALDACTONE) 25 MG tablet TAKE 1 TABLET (25 MG TOTAL) BY MOUTH DAILY. 90 tablet 3   SYMBICORT 160-4.5 MCG/ACT inhaler INHALE 2 PUFFS INTO THE LUNGS TWICE A DAY 30.6 each 1   triamcinolone (NASACORT) 55 MCG/ACT AERO nasal inhaler Place 2 sprays into the nose daily.     venlafaxine XR (EFFEXOR-XR) 150 MG 24  hr capsule Take 1 capsule (150 mg total) by mouth daily with breakfast. 90 capsule 0   zinc gluconate 50 MG tablet Take 50 mg by mouth daily.     zolpidem (AMBIEN) 5 MG tablet TAKE 1 TABLET BY MOUTH EVERY DAY AT BEDTIME AS NEEDED FOR SLEEP STRENGTH: 5 MG 30 tablet 3   No current facility-administered medications for this encounter.   Wt Readings from Last 3 Encounters:  06/04/23 78.5 kg (173 lb)  05/02/23 77.1 kg (170 lb)  04/12/23 77.1 kg (170 lb)   BP 104/62   Pulse 83   Wt 78.5 kg (173 lb)   SpO2 95%  BMI 30.16 kg/m  Physical Exam General:  NAD. No resp difficulty, walked into clinic HEENT: Normal Neck: Supple. No JVD. Cor: Regular rate & rhythm. No rubs, gallops or murmurs. Lungs: Clear Abdomen: Soft, nontender, nondistended.  Extremities: No cyanosis, clubbing, rash, edema Neuro: Alert & oriented x 3, moves all 4 extremities w/o difficulty. Affect pleasant.  Assessment/Plan: 1. Chronic systolic CHF: Nonischemic cardiomyopathy.  EF noted to be low since 1/22.  LHC in 3/22 with mild nonobstructive coronary disease.  Last echo in 6/22 with EF 30-35%.  Cardic MRI in 9/22 showed EF 35% with moderate LV hypokinesis, RV EF 44%, small area of LGE at the true apex (?small embolic apical infarct).  Echo in 2/23 showed EF up to 40-45%.  Echo 10/24 showed EF 45% with septal bounce consistent with prior sternotomy, mild RV dysfunction, normal RV. Cause of cardiomyopathy uncertain, her mother had a cardiomyopathy of uncertain etiology so cannot rule out familial cardiomyopathy.  She had treatment for breast cancer but this did not include chemotherapy.  She has been on adalimumab for ulcerative colitis, but this does not commonly cause CHF.  No drugs and rare ETOH.  The small area of apical LGE on cardiac MRI does not explain the cardiomyopathy.  NYHA class II symptoms, not volume overloaded on exam. - Continue Entresto 97/103 bid.  BMET today - Continue Coreg 18.75 mg bid.  - Continue  dapagliflozin 10 mg daily.  - Continue spironolactone 25 mg daily.  No GU symptoms. 2. RV perforation with ICD placement: S/p sternotomy/repair in 6/22.    - No change  Followup in 4 months with Dr. Shirlee Latch.  Anderson Malta [facility] FNP-BC 06/04/2023

## 2023-06-04 ENCOUNTER — Encounter (HOSPITAL_COMMUNITY): Payer: Self-pay

## 2023-06-04 ENCOUNTER — Ambulatory Visit (HOSPITAL_COMMUNITY)
Admission: RE | Admit: 2023-06-04 | Discharge: 2023-06-04 | Disposition: A | Discharge status: Home / Self Care | Payer: 59 | Source: Ambulatory Visit | Attending: Family Medicine | Admitting: Family Medicine

## 2023-06-04 VITALS — BP 104/62 | HR 83 | Wt 173.0 lb

## 2023-06-04 DIAGNOSIS — I9789 Other postprocedural complications and disorders of the circulatory system, not elsewhere classified: Secondary | ICD-10-CM | POA: Diagnosis not present

## 2023-06-04 DIAGNOSIS — Z79899 Other long term (current) drug therapy: Secondary | ICD-10-CM | POA: Diagnosis not present

## 2023-06-04 DIAGNOSIS — K5190 Ulcerative colitis, unspecified, without complications: Secondary | ICD-10-CM | POA: Diagnosis not present

## 2023-06-04 DIAGNOSIS — Z95810 Presence of automatic (implantable) cardiac defibrillator: Secondary | ICD-10-CM | POA: Insufficient documentation

## 2023-06-04 DIAGNOSIS — I5022 Chronic systolic (congestive) heart failure: Secondary | ICD-10-CM

## 2023-06-04 DIAGNOSIS — I428 Other cardiomyopathies: Secondary | ICD-10-CM | POA: Diagnosis not present

## 2023-06-04 DIAGNOSIS — C50919 Malignant neoplasm of unspecified site of unspecified female breast: Secondary | ICD-10-CM | POA: Diagnosis not present

## 2023-06-04 DIAGNOSIS — S2699XA Other injury of heart, unspecified with or without hemopericardium, initial encounter: Secondary | ICD-10-CM

## 2023-06-04 DIAGNOSIS — I110 Hypertensive heart disease with heart failure: Secondary | ICD-10-CM | POA: Insufficient documentation

## 2023-06-04 LAB — BASIC METABOLIC PANEL
Anion gap: 9 (ref 5–15)
BUN: 7 mg/dL (ref 6–20)
CO2: 24 mmol/L (ref 22–32)
Calcium: 9 mg/dL (ref 8.9–10.3)
Chloride: 103 mmol/L (ref 98–111)
Creatinine, Ser: 0.68 mg/dL (ref 0.44–1.00)
GFR, Estimated: >60 mL/min (ref >60)
Glucose, Bld: 102 mg/dL — ABNORMAL HIGH (ref 70–99)
Potassium: 4.1 mmol/L (ref 3.5–5.1)
Sodium: 136 mmol/L (ref 135–145)

## 2023-06-04 LAB — BRAIN NATRIURETIC PEPTIDE: B Natriuretic Peptide: 12.7 pg/mL (ref 0.0–100.0)

## 2023-06-04 NOTE — Patient Instructions (Signed)
Thank you for coming in today  If you had labs drawn today, any labs that are abnormal the clinic will call you No news is good news  Medications: No changes  Follow up appointments:  Your physician recommends that you schedule a follow-up appointment in:  4 months With Dr. Earlean Shawl will receive a reminder letter in the mail a few months in advance. If you don't receive a letter, please call our office to schedule the follow-up appointment.    Do the following things EVERYDAY: Weigh yourself in the morning before breakfast. Write it down and keep it in a log. Take your medicines as prescribed Eat low salt foods--Limit salt (sodium) to 2000 mg per day.  Stay as active as you can everyday Limit all fluids for the day to less than 2 liters   At the [facility], you and your health needs are our priority. As part of our continuing mission to provide you with exceptional heart care, we have created designated Provider Care Teams. These Care Teams include your primary Cardiologist (physician) and Advanced Practice Providers (APPs- Physician Assistants and Nurse Practitioners) who all work together to provide you with the care you need, when you need it.   You may see any of the following providers on your designated Care Team at your next follow up: Dr Arvilla Meres Dr Marca Ancona Dr. Marcos Eke, NP Robbie Lis, [place] [facility] [place], [place] Brynda Peon, NP Karle Plumber, PharmD   Please be sure to bring in all your medications bottles to every appointment.    Thank you for choosing [facility]  If you have any questions or concerns before your next appointment please send [place] a message through [place] or call our office at [phone].    TO LEAVE A MESSAGE FOR THE NURSE SELECT OPTION 2, PLEASE LEAVE A MESSAGE INCLUDING: YOUR NAME DATE OF BIRTH CALL BACK NUMBER REASON FOR CALL**this  is important as we prioritize the call backs  YOU WILL RECEIVE A CALL BACK THE SAME DAY AS LONG AS YOU CALL BEFORE 4:00 PM

## 2023-06-05 ENCOUNTER — Encounter: Payer: Self-pay | Admitting: Family Medicine

## 2023-06-05 DIAGNOSIS — R051 Acute cough: Secondary | ICD-10-CM

## 2023-06-05 MED ORDER — PANTOPRAZOLE SODIUM 40 MG PO TBEC: 40.0000 mg | DELAYED_RELEASE_TABLET | Freq: Every day | ORAL | 3 refills | Status: DC

## 2023-06-05 NOTE — Telephone Encounter (Signed)
CT doesn't seem to have been read yet

## 2023-06-07 MED ORDER — PREDNISONE 20 MG PO TABS
ORAL_TABLET | ORAL | 0 refills | Status: DC
Start: 2023-06-07 — End: 2023-07-19

## 2023-06-07 NOTE — Addendum Note (Signed)
Addended by: Abbe Amsterdam C on: 06/07/2023 01:00 PM   Modules accepted: Orders

## 2023-06-10 NOTE — Progress Notes (Unsigned)
 Erin Fields [address] [place], [place] [address] [phone]  Date:  06/13/2023   Name:  Erin Fields   DOB:  [birth year]   MRN:  [id number]  PCP:  Pearline Cables, MD    Chief Complaint: No chief complaint on file.   History of Present Illness:  Erin Fields is a 59 y.o. very pleasant female patient who presents with the following:  Patient seen today for physical exam- most recent visit with myself was in mid January for an acute cough  -history significant for right-sided breast cancer, type 2 diabetes, ulcerative colitis on Humira ,nonobstructive coronary artery disease, hypertension, hyperlipidemia, previous history of cardiomyopathy and ICD placement with right ventricular perforation, bilateral hip replacement  Followed by oncology and advanced CHF clinic    She had sinus surgery on 11/1-her situation has been complicated by an Aspergillus fungal infection of the sinuses  She had a cardiology (advanced heart failure clinic) appointment last week: Assessment/Plan: 1. Chronic systolic CHF: Nonischemic cardiomyopathy.  EF noted to be low since 1/22.  LHC in 3/22 with mild nonobstructive coronary disease.  Last echo in 6/22 with EF 30-35%.  Cardic MRI in 9/22 showed EF 35% with moderate LV hypokinesis, RV EF 44%, small area of LGE at the true apex (?small embolic apical infarct).  Echo in 2/23 showed EF up to 40-45%.  Echo 10/24 showed EF 45% with septal bounce consistent with prior sternotomy, mild RV dysfunction, normal RV. Cause of cardiomyopathy uncertain, her mother had a cardiomyopathy of uncertain etiology so cannot rule out familial cardiomyopathy.  She had treatment for breast cancer but this did not include chemotherapy.  She has been on adalimumab for ulcerative colitis, but this does not commonly cause CHF.  No drugs and rare ETOH.  The small area of apical LGE on cardiac MRI does not explain the  cardiomyopathy.  NYHA class II symptoms, not volume overloaded on exam. - Continue Entresto 97/103 bid.  BMET today - Continue Coreg 18.75 mg bid.  - Continue dapagliflozin 10 mg daily.  - Continue spironolactone 25 mg daily.  No GU symptoms. 2. RV perforation with ICD placement: S/p sternotomy/repair in 6/22.    - No change Followup in 4 months with Dr. Shirlee Latch.  Colon cancer screening up-to-date Pap smear up-to-date Mammogram up-to-date Foot exam up-to-date Immunizations are up-to-date  Carvedilol 18.75 twice daily Farxiga Vascepa 2 g twice daily Levothyroxine 75 Metformin 500 twice daily Crestor Entresto Spironolactone 25 daily Venlafaxine Patient Active Problem List   Diagnosis Date Noted   Chronic ethmoidal sinusitis 02/23/2023   Chronic sphenoidal sinusitis 02/23/2023   Polyp of nasal sinus 02/16/2023   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   DJD (degenerative joint disease) 12/20/2021   S/P total right hip arthroplasty 12/20/2021   Diabetes mellitus with insulin therapy (HCC) 10/24/2021   Lymphedema of breast 10/24/2021   Breast cancer (HCC) 05/11/2021   Hemothorax on left 10/02/2020   S/P evacuation of hematoma 10/02/2020   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 10/02/2020   Hypovolemic shock (HCC) 10/02/2020   Anxiety    Coronary artery disease 25% of RCA, 20% intermediate branch, 40% diagonal branch based on cardiac cath from 2022 07/22/2020   Dilated cardiomyopathy (HCC) ejection fraction 25% by echocardiogram from January 2022 04/29/2020   Congestive heart failure (HCC) 04/29/2020   Allergic rhinitis due to animal (cat) (dog) hair and dander 04/27/2020   Allergic  rhinitis due to pollen 04/27/2020   Mild persistent asthma, uncomplicated 04/27/2020   PONV (postoperative nausea and vomiting)    Migraines    GERD (gastroesophageal reflux disease)    Environmental allergies    Diabetes mellitus without complication (HCC)    Depression     Colon polyps    B12 deficiency 11/27/2019   IDA (iron deficiency anemia) 11/06/2019   Recurrent infections 10/05/2018   Anxiety and depression 01/20/2018   Right upper lobe pulmonary nodule 01/10/2018   Nasal polyp, unspecified 01/10/2018   Left sided abdominal pain 12/13/2017   Impaired fasting glucose 12/12/2017   History of adenomatous polyp of colon 11/19/2017   Genetic testing 07/16/2017   Family history of breast cancer    Controlled type 2 diabetes mellitus without complication, without long-term current use of insulin (HCC) 05/28/2017   Personal history of radiation therapy 2019   Cancer (HCC) 2019   Malignant neoplasm of upper-outer quadrant of right breast in female, 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   Crohn disease (HCC) 11/29/2016   Borderline diabetes 12/17/2015   Ex-smoker 11/18/2015   Chronic venous insufficiency 09/02/2015   Symptomatic varicose veins, right 09/02/2015   Chronic maxillary sinusitis 01/26/2015   Eustachian tube dysfunction 12/04/2014   Other allergic rhinitis 09/15/2014   Dyspnea 09/15/2014   Liver hemangioma 08/24/2014   Elevated triglycerides with high cholesterol 11/26/2013   Insomnia 08/20/2013   Hypertension 08/20/2013   Ulcerative colitis (HCC) 04/20/2013   Oral herpes 04/20/2013   Hyperlipidemia 12/06/2006   HEADACHE 12/06/2006   CERVICAL CANCER, HX OF 12/06/2006   Hypothyroidism 11/29/2006   Asthma 11/29/2006   IRRITABLE BOWEL SYNDROME 11/29/2006    Past Medical History:  Diagnosis Date   Allergic rhinitis due to animal (cat) (dog) hair and dander 04/27/2020   Allergic rhinitis due to pollen 04/27/2020   Anxiety    Anxiety and depression 01/20/2018   Arthritis    Asthma    Asthma, persistent controlled 10/05/2018   B12 deficiency 11/27/2019   Borderline diabetes 12/17/2015   Breast cancer (HCC)    Cancer (HCC) 2019   right breast cancer   CERVICAL CANCER, HX OF 12/06/2006   Qualifier: Diagnosis of  By: Lovell Sheehan MD,  John E    Chronic sinusitis 01/26/2015   Chronic venous insufficiency 09/02/2015   Colon polyps    Congestive heart failure (HCC) 04/29/2020   Controlled type 2 diabetes mellitus without complication, without long-term current use of insulin (HCC) 05/28/2017   Coronary artery disease 25% of RCA, 20% intermediate branch, 40% diagonal branch based on cardiac cath from 2022 07/22/2020   Crohn disease (HCC) 11/29/2016   Depression    Diabetes mellitus without complication (HCC)    type 2   Dilated cardiomyopathy (HCC) ejection fraction 25% by echocardiogram from January 2022 04/29/2020   Dyspnea 09/15/2014   Elevated triglycerides with high cholesterol 11/26/2013   Environmental allergies    Eustachian tube dysfunction 12/04/2014   Ex-smoker 11/18/2015   Family history of breast cancer    Genetic testing 07/16/2017   Negative genetic testing on the multi-cancer panel.  The Multi-Gene Panel offered by [name] includes sequencing and/or deletion duplication testing of the following 83 genes: ALK, APC, ATM, AXIN2,BAP1,  BARD1, BLM, BMPR1A, BRCA1, BRCA2, BRIP1, CASR, CDC73, CDH1, CDK4, CDKN1B, CDKN1C, CDKN2A (p14ARF), CDKN2A (p16INK4a), CEBPA, CHEK2, CTNNA1, DICER1, DIS3L2, EGFR (c.2369C>T, p.Thr790Met variant onl   GERD (gastroesophageal reflux disease)    HEADACHE 12/06/2006   Qualifier: Diagnosis of  By: Lovell Sheehan MD, Jonny Ruiz  E    Hemothorax on left 10/02/2020   History of adenomatous polyp of colon 11/19/2017   Hyperlipidemia    Hypertension    Hypothyroidism    IDA (iron deficiency anemia) 11/06/2019   Impaired fasting glucose 12/12/2017   Insomnia 08/20/2013   Irritable bowel syndrome 11/29/2006   Qualifier: Diagnosis of  By: Lovell Sheehan MD, John E    Left sided abdominal pain 12/13/2017   Liver hemangioma 08/24/2014   Malignant neoplasm of upper-outer quadrant of right breast in female, 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   Migraines    Mild persistent asthma, uncomplicated  04/27/2020   Nasal polyp, unspecified 01/10/2018   Oral herpes 04/20/2013   Other allergic rhinitis 09/15/2014   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 10/02/2020   Personal history of radiation therapy 2019   36 radiation tx   PONV (postoperative nausea and vomiting)    scopolamine patch helps   Recurrent infections 10/05/2018   Right upper lobe pulmonary nodule 01/10/2018   Symptomatic varicose veins, right 09/02/2015   Ulcerative colitis (HCC)    Varicose veins     Past Surgical History:  Procedure Laterality Date   BREAST BIOPSY Right 02/2017   BREAST BIOPSY Right 11/07/2019   BREAST LUMPECTOMY Right 04/24/2017   BREAST LUMPECTOMY WITH RADIOACTIVE SEED AND SENTINEL LYMPH NODE BIOPSY Right 04/24/2017   Procedure: RIGHT BREAST LUMPECTOMY WITH RADIOACTIVE SEED AND SENTINEL LYMPH NODE BIOPSY;  Surgeon: Almond Lint, MD;  Location: [facility];  Service: General;  Laterality: Right;   CORONARY ARTERY BYPASS GRAFT     crapal tunnel relase Left 05/2018   ENDOMETRIAL ABLATION  2010   Had abnormal uterine bleeding (heavy and frequent), performed in [place], menses stopped completely 2 years after   ETHMOIDECTOMY Right 02/16/2023   Procedure: RIGHT TOTAL ETHMOIDECTOMY;  Surgeon: Newman Pies, MD;  Location: [facility];  Service: ENT;  Laterality: Right;   EXPLORATORY LAPAROTOMY     Had exploratory surgery at the age of 63 for abdominal pain with cyst found on her left ovary, not treated surgically   FOOT SURGERY Right 2012   GENERATOR REMOVAL  10/02/2020   Procedure: GENERATOR REMOVAL;  Surgeon: Purcell Nails, MD;  Location: [facility] OR;  Service: Thoracic;;   HAND SURGERY Right    ICD IMPLANT N/A 10/01/2020   Procedure: ICD IMPLANT;  Surgeon: Regan Lemming, MD;  Location: [facility];  Service: Cardiovascular;  Laterality: N/A;   ICD LEAD REMOVAL N/A 10/02/2020   Procedure: ICD LEAD REMOVAL;  Surgeon: Purcell Nails, MD;  Location: [facility];  Service:  Thoracic;  Laterality: N/A;   MAXILLARY ANTROSTOMY Right 02/16/2023   Procedure: RIGHT MAXILLARY ANTROSTOMY WITH TISSUE REMOVAL;  Surgeon: Newman Pies, MD;  Location: [facility];  Service: ENT;  Laterality: Right;   MEDIASTERNOTOMY N/A 10/02/2020   Procedure: MEDIAN STERNOTOMY REPAIR PERFORATED VENTRICLE EVACUATION LEFT HEMOTHORAX;  Surgeon: Purcell Nails, MD;  Location: [facility];  Service: Thoracic;  Laterality: N/A;   RE-EXCISION OF BREAST LUMPECTOMY Right 05/15/2017   Procedure: RE-EXCISION OF BREAST LUMPECTOMY;  Surgeon: Almond Lint, MD;  Location: [facility];  Service: General;  Laterality: Right;   RIGHT/LEFT HEART CATH AND CORONARY ANGIOGRAPHY N/A 06/21/2020   Procedure: RIGHT/LEFT HEART CATH AND CORONARY ANGIOGRAPHY;  Surgeon: Yvonne Kendall, MD;  Location: [facility];  Service: Cardiovascular;  Laterality: N/A;   ROBOTIC ASSISTED SALPINGO OOPHERECTOMY Bilateral 08/29/2019   Procedure: XI ROBOTIC ASSISTED SALPINGO OOPHORECTOMY;  Surgeon: Pricilla Holm,  Carmelina Peal, MD;  Location: [facility];  Service: Gynecology;  Laterality: Bilateral;   Septoplasty with turbinate reduction     SINUS ENDO WITH FUSION Right 02/16/2023   Procedure: SINUS ENDO WITH FUSION;  Surgeon: Newman Pies, MD;  Location: [facility];  Service: ENT;  Laterality: Right;   SPHENOIDECTOMY Right 02/16/2023   Procedure: RIGHT SPHENOIDECTOMY WITH TISSUE REMOVAL;  Surgeon: Newman Pies, MD;  Location: [facility];  Service: ENT;  Laterality: Right;   TEE WITHOUT CARDIOVERSION N/A 10/02/2020   Procedure: TRANSESOPHAGEAL ECHOCARDIOGRAM (TEE);  Surgeon: Purcell Nails, MD;  Location: [facility] OR;  Service: Thoracic;  Laterality: N/A;   TONSILLECTOMY  1980   TOTAL HIP ARTHROPLASTY Right 12/20/2021   Procedure: RIGHT TOTAL HIP ARTHROPLASTY ANTERIOR APPROACH;  Surgeon: Sheral Apley, MD;  Location: [facility];  Service: Orthopedics;  Laterality: Right;   TOTAL HIP ARTHROPLASTY Left 05/23/2022   Procedure: TOTAL HIP ARTHROPLASTY  ANTERIOR APPROACH;  Surgeon: Sheral Apley, MD;  Location: [facility];  Service: Orthopedics;  Laterality: Left;   VEIN SURGERY     Removal Right Leg   WISDOM TOOTH EXTRACTION      Social History   Tobacco Use   Smoking status: Former    Current packs/day: 0.00    Average packs/day: 3.0 packs/day for 15.0 years (45.0 ttl pk-yrs)    Types: Cigarettes    Start date: 09/15/1978    Quit date: 09/14/1993    Years since quitting: 29.7   Smokeless tobacco: Never   Tobacco comments:    Quit 26 yrs ago  Vaping Use   Vaping status: Never Used  Substance Use Topics   Alcohol use: Yes    Alcohol/week: 1.0 - 2.0 standard drink of alcohol    Types: 1 - 2 Glasses of wine per week    Comment: occ   Drug use: No    Family History  Problem Relation Age of Onset   Arthritis Mother 26       Deceased   Breast cancer Mother    Lung cancer Mother    Hyperlipidemia Mother    Heart disease Mother    Hypertension Mother    Diabetes Mother    Crohn's disease Mother    Diabetes Maternal Grandfather    Heart disease Maternal Grandfather    Heart attack Maternal Grandfather    Leukemia Maternal Grandfather    Colitis Sister    Leukemia Cousin 3       mat cousin   Lung cancer Father    Brain cancer Father     Allergies  Allergen Reactions   Losartan Potassium Shortness Of Breath   Ace Inhibitors Cough   Aspirin     Uncoated Aspirin upsets stomach   Ceclor [Cefaclor] Hives    Has tolerated Ancef in 2022   Sulfa Antibiotics Hives and Other (See Comments)   Lisinopril Cough    Medication list has been reviewed and updated.  Current Outpatient Medications on File Prior to Visit  Medication Sig Dispense Refill   acetaminophen (TYLENOL) 500 MG tablet Take 2 tablets (1,000 mg total) by mouth every 6 (six) hours as needed for moderate pain or mild pain. 30 tablet 0   acyclovir (ZOVIRAX) 400 MG tablet Take 1 tablet (400 mg total) by mouth 3 (three) times daily as needed. 30 tablet 1    Adalimumab 80 MG/0.8ML PNKT Inject 80 mg into the skin every 14 (fourteen) days.     albuterol (VENTOLIN HFA) 108 (90 Base)  MCG/ACT inhaler Inhale 2 puffs into the lungs every 6 (six) hours as needed for shortness of breath.     Azelastine HCl 0.15 % SOLN Place 2 sprays into both nostrils 2 (two) times daily.     Calcium Carb-Cholecalciferol (CALCIUM 600 + D PO) Take 1 tablet by mouth daily.     carvedilol (COREG) 12.5 MG tablet TAKE 1.5 TABLETS (18.75 MG TOTAL) BY MOUTH 2 (TWO) TIMES DAILY. 270 tablet 3   cetirizine (ZYRTEC) 10 MG tablet Take 10 mg by mouth 2 (two) times daily.     dapagliflozin propanediol (FARXIGA) 10 MG TABS tablet Take 1 tablet (10 mg total) by mouth daily before breakfast. 30 tablet 11   EPINEPHrine 0.3 mg/0.3 mL IJ SOAJ injection Inject 0.3 mg into the muscle as needed for anaphylaxis.     Hyoscyamine Sulfate 0.375 MG TBCR Take 0.375 mg by mouth 2 (two) times daily as needed (stomach cramping).     icosapent Ethyl (VASCEPA) 1 g capsule Take 2 capsules (2 g total) by mouth 2 (two) times daily. 120 capsule 5   lansoprazole (PREVACID SOLUTAB) 15 MG disintegrating tablet Take 15 mg by mouth 2 (two) times daily before a meal.     levothyroxine (SYNTHROID) 75 MCG tablet Take 1 tablet (75 mcg total) by mouth daily before breakfast. 90 tablet 1   loperamide (IMODIUM A-D) 2 MG tablet Take 2 mg by mouth 4 (four) times daily as needed for diarrhea or loose stools.     MAGNESIUM PO Take 250 mg by mouth daily.     mesalamine (LIALDA) 1.2 g EC tablet Take 2.4 g by mouth in the morning and at bedtime.      metFORMIN (GLUCOPHAGE) 500 MG tablet TAKE 1 TABLET BY MOUTH TWICE A DAY WITH FOOD 180 tablet 3   montelukast (SINGULAIR) 10 MG tablet Take 10 mg by mouth at bedtime.      Multiple Vitamin (MULTIVITAMIN WITH MINERALS) TABS tablet Take 1 tablet by mouth daily.     pantoprazole (PROTONIX) 40 MG tablet Take 1 tablet (40 mg total) by mouth daily. 30 tablet 3   predniSONE (DELTASONE) 20 MG  tablet Take 40 mg daily for 3-5 days 10 tablet 0   promethazine (PHENERGAN) 12.5 MG tablet Take 1 tablet (12.5 mg total) by mouth every 6 (six) hours as needed for nausea or vomiting. 30 tablet 0   rosuvastatin (CRESTOR) 20 MG tablet Take 1 tablet (20 mg total) by mouth daily. 90 tablet 1   sacubitril-valsartan (ENTRESTO) 97-103 MG Take 1 tablet by mouth 2 (two) times daily. 60 tablet 11   spironolactone (ALDACTONE) 25 MG tablet TAKE 1 TABLET (25 MG TOTAL) BY MOUTH DAILY. 90 tablet 3   SYMBICORT 160-4.5 MCG/ACT inhaler INHALE 2 PUFFS INTO THE LUNGS TWICE A DAY 30.6 each 1   triamcinolone (NASACORT) 55 MCG/ACT AERO nasal inhaler Place 2 sprays into the nose daily.     venlafaxine XR (EFFEXOR-XR) 150 MG 24 hr capsule Take 1 capsule (150 mg total) by mouth daily with breakfast. 90 capsule 0   zinc gluconate 50 MG tablet Take 50 mg by mouth daily.     zolpidem (AMBIEN) 5 MG tablet TAKE 1 TABLET BY MOUTH EVERY DAY AT BEDTIME AS NEEDED FOR SLEEP STRENGTH: 5 MG 30 tablet 3   No current facility-administered medications on file prior to visit.    Review of Systems:  As per HPI- otherwise negative.   Physical Examination: There were no vitals filed for this visit.  There were no vitals filed for this visit. There is no height or weight on file to calculate BMI. Ideal Body Weight:    GEN: no acute distress. HEENT: Atraumatic, Normocephalic.  Ears and Nose: No external deformity. CV: RRR, No M/G/R. No JVD. No thrill. No extra heart sounds. PULM: CTA B, no wheezes, crackles, rhonchi. No retractions. No resp. distress. No accessory muscle use. ABD: S, NT, ND, +BS. No rebound. No HSM. EXTR: No c/c/e PSYCH: Normally interactive. Conversant.    Assessment and Plan: *** Patient seen today for physical exam.  Encouraged healthy diet and exercise routine Signed Abbe Amsterdam, MD

## 2023-06-10 NOTE — Patient Instructions (Signed)
 It was good to see you today!

## 2023-06-13 ENCOUNTER — Ambulatory Visit (INDEPENDENT_AMBULATORY_CARE_PROVIDER_SITE_OTHER): Payer: 59 | Admitting: Family Medicine

## 2023-06-13 VITALS — BP 112/60 | HR 84 | Temp 98.0°F | Resp 18 | Ht 63.5 in | Wt 169.6 lb

## 2023-06-13 DIAGNOSIS — J4521 Mild intermittent asthma with (acute) exacerbation: Secondary | ICD-10-CM | POA: Diagnosis not present

## 2023-06-13 DIAGNOSIS — Z0000 Encounter for general adult medical examination without abnormal findings: Secondary | ICD-10-CM

## 2023-06-13 DIAGNOSIS — R110 Nausea: Secondary | ICD-10-CM | POA: Diagnosis not present

## 2023-06-13 DIAGNOSIS — R0902 Hypoxemia: Secondary | ICD-10-CM

## 2023-06-13 DIAGNOSIS — E119 Type 2 diabetes mellitus without complications: Secondary | ICD-10-CM

## 2023-06-13 MED ORDER — PROMETHAZINE HCL 12.5 MG PO TABS: 12.5000 mg | ORAL_TABLET | Freq: Four times a day (QID) | ORAL | 3 refills | Status: AC | PRN

## 2023-06-22 ENCOUNTER — Inpatient Hospital Stay: Payer: Self-pay

## 2023-06-22 ENCOUNTER — Encounter: Payer: Self-pay | Admitting: Family Medicine

## 2023-07-03 NOTE — Telephone Encounter (Signed)
 Spoke with patient. She confirms she has never used a DME company before and is interested in using [employer] for the ONO. Advised pt that [employer] representative will reach out to her to set up this test. Pt verbalizes understanding and appreciation of call.  [employer] representatives notified of order via [employer].

## 2023-07-08 ENCOUNTER — Other Ambulatory Visit: Payer: Self-pay | Admitting: Family Medicine

## 2023-07-08 DIAGNOSIS — E034 Atrophy of thyroid (acquired): Secondary | ICD-10-CM

## 2023-07-14 ENCOUNTER — Encounter: Payer: Self-pay | Admitting: Family Medicine

## 2023-07-14 DIAGNOSIS — G4734 Idiopathic sleep related nonobstructive alveolar hypoventilation: Secondary | ICD-10-CM

## 2023-07-17 NOTE — Telephone Encounter (Signed)
 Spoke with patient regarding overnight oximetry results, which indicated that pt should qualify for nocturnal oxygen per Dr. Patsy Lager. Pt is agreeable to using O2 overnight. Will send order to DME company of patient's choice ([employer]) once dose clarified by Dr. Patsy Lager. Pt is aware that it will likely take a few days for [employer] representative to contact her once order has been received. She denies additional questions at this time.

## 2023-07-18 ENCOUNTER — Other Ambulatory Visit: Payer: Self-pay | Admitting: Family Medicine

## 2023-07-18 DIAGNOSIS — F5104 Psychophysiologic insomnia: Secondary | ICD-10-CM

## 2023-07-19 ENCOUNTER — Encounter: Payer: Self-pay | Admitting: Family Medicine

## 2023-07-19 DIAGNOSIS — R051 Acute cough: Secondary | ICD-10-CM

## 2023-07-19 MED ORDER — PREDNISONE 20 MG PO TABS: ORAL_TABLET | ORAL | 0 refills | Status: DC

## 2023-07-26 ENCOUNTER — Inpatient Hospital Stay: Payer: Self-pay | Attending: Nurse Practitioner

## 2023-07-26 ENCOUNTER — Inpatient Hospital Stay: Payer: Self-pay | Admitting: Hematology

## 2023-07-26 ENCOUNTER — Other Ambulatory Visit: Payer: Self-pay

## 2023-07-26 ENCOUNTER — Encounter: Payer: Self-pay | Admitting: Hematology

## 2023-07-26 VITALS — BP 120/72 | HR 82 | Temp 97.6°F | Resp 18 | Ht 63.5 in | Wt 174.6 lb

## 2023-07-26 DIAGNOSIS — Z803 Family history of malignant neoplasm of breast: Secondary | ICD-10-CM | POA: Diagnosis not present

## 2023-07-26 DIAGNOSIS — Z170 Estrogen receptor positive status [ER+]: Secondary | ICD-10-CM | POA: Diagnosis not present

## 2023-07-26 DIAGNOSIS — Z8541 Personal history of malignant neoplasm of cervix uteri: Secondary | ICD-10-CM | POA: Diagnosis not present

## 2023-07-26 DIAGNOSIS — Z1231 Encounter for screening mammogram for malignant neoplasm of breast: Secondary | ICD-10-CM

## 2023-07-26 DIAGNOSIS — Z853 Personal history of malignant neoplasm of breast: Secondary | ICD-10-CM | POA: Insufficient documentation

## 2023-07-26 DIAGNOSIS — Z923 Personal history of irradiation: Secondary | ICD-10-CM | POA: Diagnosis not present

## 2023-07-26 DIAGNOSIS — Z862 Personal history of diseases of the blood and blood-forming organs and certain disorders involving the immune mechanism: Secondary | ICD-10-CM | POA: Diagnosis not present

## 2023-07-26 DIAGNOSIS — M8580 Other specified disorders of bone density and structure, unspecified site: Secondary | ICD-10-CM | POA: Diagnosis not present

## 2023-07-26 DIAGNOSIS — C50411 Malignant neoplasm of upper-outer quadrant of right female breast: Secondary | ICD-10-CM | POA: Diagnosis not present

## 2023-07-26 DIAGNOSIS — E2839 Other primary ovarian failure: Secondary | ICD-10-CM | POA: Diagnosis not present

## 2023-07-26 LAB — CMP (CANCER CENTER ONLY)
ALT: 17 U/L (ref 0–44)
AST: 14 U/L — ABNORMAL LOW (ref 15–41)
Albumin: 4.5 g/dL (ref 3.5–5.0)
Alkaline Phosphatase: 47 U/L (ref 38–126)
Anion gap: 6 (ref 5–15)
BUN: 10 mg/dL (ref 6–20)
CO2: 30 mmol/L (ref 22–32)
Calcium: 9.6 mg/dL (ref 8.9–10.3)
Chloride: 101 mmol/L (ref 98–111)
Creatinine: 0.64 mg/dL (ref 0.44–1.00)
GFR, Estimated: >60 mL/min (ref >60)
Glucose, Bld: 126 mg/dL — ABNORMAL HIGH (ref 70–99)
Potassium: 4.2 mmol/L (ref 3.5–5.1)
Sodium: 137 mmol/L (ref 135–145)
Total Bilirubin: 0.9 mg/dL (ref 0.0–1.2)
Total Protein: 6.7 g/dL (ref 6.5–8.1)

## 2023-07-26 LAB — CBC WITH DIFFERENTIAL (CANCER CENTER ONLY)
Abs Immature Granulocytes: 0.13 10*3/uL — ABNORMAL HIGH (ref 0.00–0.07)
Basophils Absolute: 0.1 10*3/uL (ref 0.0–0.1)
Basophils Relative: 1 %
Eosinophils Absolute: 0.2 10*3/uL (ref 0.0–0.5)
Eosinophils Relative: 2 %
HCT: 38.8 % (ref 36.0–46.0)
Hemoglobin: 13.2 g/dL (ref 12.0–15.0)
Immature Granulocytes: 1 %
Lymphocytes Relative: 41 %
Lymphs Abs: 3.9 10*3/uL (ref 0.7–4.0)
MCH: 29.7 pg (ref 26.0–34.0)
MCHC: 34 g/dL (ref 30.0–36.0)
MCV: 87.2 fL (ref 80.0–100.0)
Monocytes Absolute: 1.1 10*3/uL — ABNORMAL HIGH (ref 0.1–1.0)
Monocytes Relative: 11 %
Neutro Abs: 4.1 10*3/uL (ref 1.7–7.7)
Neutrophils Relative %: 44 %
Platelet Count: 365 10*3/uL (ref 150–400)
RBC: 4.45 MIL/uL (ref 3.87–5.11)
RDW: 12.9 % (ref 11.5–15.5)
WBC Count: 9.6 10*3/uL (ref 4.0–10.5)
nRBC: 0 % (ref 0.0–0.2)

## 2023-07-26 LAB — IRON AND IRON BINDING CAPACITY (CC-WL,HP ONLY)
Iron: 70 ug/dL (ref 28–170)
Saturation Ratios: 17 % (ref 10.4–31.8)
TIBC: 416 ug/dL (ref 250–450)
UIBC: 346 ug/dL (ref 148–442)

## 2023-07-26 LAB — FERRITIN: Ferritin: 16 ng/mL (ref 11–307)

## 2023-07-26 NOTE — Assessment & Plan Note (Addendum)
 invasive lobular carcinoma, stage IA, pT2N0M0, ER+/PR+, HER2-, Grade II -diagnosed on 03/16/17. She was treated with right lumpectomy, re-excision surgery and adjuvant radiation. Oncotype RS of 3, low risk. -I started her on antiestrogen therapy with Letrozole in 08/2017. Due to poor tolerance with joint pain, this was changed to Exemestane in 09/2017. -She held exemestane 4/29 - 08/30/20 due to concern for heart issues, restarted per okay from cardiologist. She completed 5 year therapy in 08/2022 -she is clinically doing well. Labs reviewed, overall WNL. Physical exam was unremarkable. There is no clinical concern for recurrence.

## 2023-07-26 NOTE — Progress Notes (Signed)
 [facility]   Telephone:(336) [phone] Fax:(336) [phone]   Clinic Follow up Note   Patient Care Team: Copland, Gwenlyn Found, MD as PCP - General (Family Medicine) Georgeanna Lea, MD as PCP - Cardiology (Cardiology) Essie Hart, MD (Inactive) as Referring Physician (Obstetrics and Gynecology) Sharrell Ku, MD as Consulting Physician (Gastroenterology) Sidney Ace, MD as Referring Physician (Allergy) Almond Lint, MD as Consulting Physician (General Surgery) Antony Blackbird, MD as Consulting Physician (Radiation Oncology) Malachy Mood, MD as Consulting Physician (Hematology) Axel Filler, Larna Daughters, NP as Nurse Practitioner (Hematology and Oncology) [name], MD as Consulting Physician (Gynecologic Oncology) Georgeanna Lea, MD as Consulting Physician (Cardiology)  Date of Service:  07/26/2023  CHIEF COMPLAINT: f/u of right breast cancer  CURRENT THERAPY:  Cancer surveillance  Oncology History   Malignant neoplasm of upper-outer quadrant of right breast in female, estrogen receptor positive (HCC)  invasive lobular carcinoma, stage IA, pT2N0M0, ER+/PR+, HER2-, Grade II -diagnosed on 03/16/17. She was treated with right lumpectomy, re-excision surgery and adjuvant radiation. Oncotype RS of 3, low risk. -I started her on antiestrogen therapy with Letrozole in 08/2017. Due to poor tolerance with joint pain, this was changed to Exemestane in 09/2017. -She held exemestane 4/29 - 08/30/20 due to concern for heart issues, restarted per okay from cardiologist. She completed 5 year therapy in 08/2022 -she is clinically doing well. Labs reviewed, overall WNL. Physical exam was unremarkable. There is no clinical concern for recurrence.   Assessment & Plan Breast Cancer Follow-up for breast cancer. Reports no new pain, only normal post-surgical discomfort. Last chemotherapy with axamustine completed on October 15, 2022. Mammogram in October 2024 was negative. No  signs of recurrence during physical examination. Right breast shows scar tissue from surgery, and nipple inversion is consistent with post-surgical changes. - Order mammogram for October 2025 - Schedule follow-up visit in one year with nurse practitioner  Osteopenia Osteopenia diagnosed in 2023. Taking calcium and vitamin D supplements. Bone density scan is due this year. Previous risk of fracture was not high, so no bone-specific medication is currently needed. - Order bone density scan for October 2025  Cardiac condition Underwent heart surgery in 2021, including defibrillator placement and removal. Currently taking Entresto and spironolactone for cardiac management. Regular follow-ups with cardiologist every six months.  Anemia (resolved) Anemia noted last year has resolved. Current blood counts are normal. Taking a multivitamin daily.  PLAN -Patient is clinically doing well, no concern for recurrence.  Continue breast cancer surveillance. Prefers annual follow-up visits with oncology. Regular follow-ups with family doctor every three months and cardiologist every six months. - Schedule follow-up visit in one year with nurse practitioner -I ordered a mammogram and a bone density scan for October 2025     SUMMARY OF ONCOLOGIC HISTORY: Oncology History Overview Note  Cancer Staging Malignant neoplasm of upper-outer quadrant of right breast in female, estrogen receptor positive (HCC) Staging form: Breast, AJCC 8th Edition - Clinical stage from 03/16/2017: Stage IA (cT1b, cN0, cM0, G2, ER: Positive, PR: Positive, HER2: Negative) - Signed by Malachy Mood, MD on 03/28/2017 - Pathologic stage from 05/15/2017: Stage IA (pT2, pN0, cM0, G2, ER+, PR+, HER2-, Oncotype DX score: 3) - Signed by Malachy Mood, MD on 08/06/2017     Malignant neoplasm of upper-outer quadrant of right breast in female, estrogen receptor positive (HCC)  03/15/2017 Mammogram   Diagnostic Mammogram Right Breast   IMPRESSION: Palpable abnormality corresponds to distortion and focal mass associated acoustic shadowing measuring 0.8x0.9x0.9cm in the  930 o'clock location of the right breast 7 cm from the nipple.    RECOMMENDATION: Ultrasound-guided core biopsy is recommended and scheduled for the patient.   03/16/2017 Initial Biopsy   Biopsy Results  Diagnosis 03/16/17 Breast, right, needle core biopsy, 9:30 o'clock, ribbon clip placed - INVASIVE MAMMARY CARCINOMA. - SEE COMMENT.    03/16/2017 Receptors her2   Estrogen Receptor: 95%, POSITIVE, STRONG STAINING INTENSITY Progesterone Receptor: 95%, POSITIVE, STRONG STAINING INTENSITY Proliferation Marker Ki67: 3% HER2: NEGATIVE   03/20/2017 Initial Diagnosis   Malignant neoplasm of upper-outer quadrant of right breast in female, estrogen receptor positive (HCC)   04/24/2017 Surgery   Right Breast Lumpectomy with Sentinel Node Biopsy with Dr. Donell Beers    04/24/2017 Pathology Results   Diagnosis 04/24/17 1. Breast, lumpectomy, Right w/seed - INVASIVE LOBULAR CARCINOMA, GRADE 2, SPANNING 2.2 CM. - LOBULAR NEOPLASIA (ATYPICAL LOBULAR HYPERPLASIA). - ANTERIOR SUPERIOR MARGIN IS BROADLY POSITIVE. - BIOPSY SITE. - SEE ONCOLOGY TABLE. 2. Breast, excision, Right additional Medial Margin - INVASIVE LOBULAR CARCINOMA. - LOBULAR CARCINOMA IN SITU. - INVASIVE CARCINOMA IS 0.3 CM FROM NEW MEDIAL MARGIN. 3. Lymph node, sentinel, biopsy, Right Axillary #1 - ISOLATED TUMOR CELLS IN ONE OF ONE LYMPH NODES (0/1). 4. Lymph node, sentinel, biopsy, Right Axillary #2 - ONE OF ONE LYMPH NODES NEGATIVE FOR CARCINOMA (0/1). 5. Lymph node, sentinel, biopsy, Right Axillary #3 - ONE OF ONE LYMPH NODES NEGATIVE FOR CARCINOMA (0/1).   04/24/2017 Oncotype testing   Her Oncotyple recurrence score was 3, her distant recurrence score with Tamoxifen was 3% and her adjuvant chemotherapy benefit was <1%.    05/15/2017 Surgery   Re-excision of Right Breast Lumpectomy with Dr.  Donell Beers    05/15/2017 Pathology Results   Diagnosis 05/15/17 Breast, excision, Right additional anterior margin - INVASIVE LOBULAR CARCINOMA. - ATYPICAL LOBULAR HYPERPLASIA. - THE NEW ANTERIOR SURGICAL RESECTION MARGIN IS NEGATIVE FOR CARCINOMA.   05/15/2017 Cancer Staging   Staging form: Breast, AJCC 8th Edition - Pathologic stage from 05/15/2017: Stage IA (pT2, pN0, cM0, G2, ER+, PR+, HER2-, Oncotype DX score: 3) - Signed by Malachy Mood, MD on 08/06/2017   06/26/2017 -  Radiation Therapy   Adjuvant RT with Dr. Roselind Messier   07/12/2017 Genetic Testing   Negative genetic testing on the multi-cancer panel.  The Multi-Gene Panel offered by [name] includes sequencing and/or deletion duplication testing of the following 83 genes: ALK, APC, ATM, AXIN2,BAP1,  BARD1, BLM, BMPR1A, BRCA1, BRCA2, BRIP1, CASR, CDC73, CDH1, CDK4, CDKN1B, CDKN1C, CDKN2A (p14ARF), CDKN2A (p16INK4a), CEBPA, CHEK2, CTNNA1, DICER1, DIS3L2, EGFR (c.2369C>T, p.Thr790Met variant only), EPCAM (Deletion/duplication testing only), FH, FLCN, GATA2, GPC3, GREM1 (Promoter region deletion/duplication testing only), HOXB13 (c.251G>A, p.Gly84Glu), HRAS, KIT, MAX, MEN1, MET, MITF (c.952G>A, p.Glu318Lys variant only), MLH1, MSH2, MSH3, MSH6, MUTYH, NBN, NF1, NF2, NTHL1, PALB2, PDGFRA, PHOX2B, PMS2, POLD1, POLE, POT1, PRKAR1A, PTCH1, PTEN, RAD50, RAD51C, RAD51D, RB1, RECQL4, RET, RUNX1, SDHAF2, SDHA (sequence changes only), SDHB, SDHC, SDHD, SMAD4, SMARCA4, SMARCB1, SMARCE1, STK11, SUFU, TERT, TERT, TMEM127, TP53, TSC1, TSC2, VHL, WRN and WT1.  The report date is July 12, 2017.    08/13/2017 Imaging   08/13/2017 DEXA ASSESSMENT: The BMD measured at AP Spine L1-L4 is 1.120 g/cm2 with a T-score of -0.5. This patient is considered normal according to World Health Organization [facility]) criteria.    08/2017 -  Anti-estrogen oral therapy   Letrozole daily, changed to Exemestane due to poor tolerance of Letrozole   01/16/2018 Imaging   01/16/2018 CT  Chest IMPRESSION: Stable 2 mm RIGHT upper  lobe nodule.   Post radiation therapy changes at the anterolateral RIGHT upper lobe.   Post RIGHT breast surgery and RIGHT axillary node dissection.   No new abnormalities.   Aortic Atherosclerosis (ICD10-I70.0).   08/09/2018 Mammogram   IMPRESSION: 1. No mammographic evidence of malignancy involving either breast. 2. Expected post lumpectomy and post radiation changes involving the RIGHT breast.      Discussed the use of AI scribe software for clinical note transcription with the patient, who gave verbal consent to proceed.  History of Present Illness The patient, a 58 year old female with a history of breast cancer and heart surgery, presents for a routine follow-up. She reports no new complaints and describes her current state as "good." She experiences some discomfort in the breast post-surgery, which she describes as "normal." She is on multiple medications including Effexor, which helps with hot flushes, Entresto for her heart, spironolactone, metformin for blood pressure, and thyroid medicine. She completed axamustine treatment in June and is due for a mammogram and bone density scan. She also has osteopenia and is taking calcium and vitamin D supplements.     All other systems were reviewed with the patient and are negative.  MEDICAL HISTORY:  Past Medical History:  Diagnosis Date   Allergic rhinitis due to animal (cat) (dog) hair and dander 04/27/2020   Allergic rhinitis due to pollen 04/27/2020   Anxiety    Anxiety and depression 01/20/2018   Arthritis    Asthma    Asthma, persistent controlled 10/05/2018   B12 deficiency 11/27/2019   Borderline diabetes 12/17/2015   Breast cancer (HCC)    Cancer (HCC) 2019   right breast cancer   CERVICAL CANCER, HX OF 12/06/2006   Qualifier: Diagnosis of  By: Lovell Sheehan MD, John E    Chronic sinusitis 01/26/2015   Chronic venous insufficiency 09/02/2015   Colon polyps    Congestive  heart failure (HCC) 04/29/2020   Controlled type 2 diabetes mellitus without complication, without long-term current use of insulin (HCC) 05/28/2017   Coronary artery disease 25% of RCA, 20% intermediate branch, 40% diagonal branch based on cardiac cath from 2022 07/22/2020   Crohn disease (HCC) 11/29/2016   Depression    Diabetes mellitus without complication (HCC)    type 2   Dilated cardiomyopathy (HCC) ejection fraction 25% by echocardiogram from January 2022 04/29/2020   Dyspnea 09/15/2014   Elevated triglycerides with high cholesterol 11/26/2013   Environmental allergies    Eustachian tube dysfunction 12/04/2014   Ex-smoker 11/18/2015   Family history of breast cancer    Genetic testing 07/16/2017   Negative genetic testing on the multi-cancer panel.  The Multi-Gene Panel offered by [name] includes sequencing and/or deletion duplication testing of the following 83 genes: ALK, APC, ATM, AXIN2,BAP1,  BARD1, BLM, BMPR1A, BRCA1, BRCA2, BRIP1, CASR, CDC73, CDH1, CDK4, CDKN1B, CDKN1C, CDKN2A (p14ARF), CDKN2A (p16INK4a), CEBPA, CHEK2, CTNNA1, DICER1, DIS3L2, EGFR (c.2369C>T, p.Thr790Met variant onl   GERD (gastroesophageal reflux disease)    HEADACHE 12/06/2006   Qualifier: Diagnosis of  By: Lovell Sheehan MD, John E    Hemothorax on left 10/02/2020   History of adenomatous polyp of colon 11/19/2017   Hyperlipidemia    Hypertension    Hypothyroidism    IDA (iron deficiency anemia) 11/06/2019   Impaired fasting glucose 12/12/2017   Insomnia 08/20/2013   Irritable bowel syndrome 11/29/2006   Qualifier: Diagnosis of  By: Lovell Sheehan MD, John E    Left sided abdominal pain 12/13/2017   Liver hemangioma 08/24/2014   Malignant  neoplasm of upper-outer quadrant of right breast in female, 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   Migraines    Mild persistent asthma, uncomplicated 04/27/2020   Nasal polyp, unspecified 01/10/2018   Oral herpes 04/20/2013   Other allergic rhinitis 09/15/2014    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 10/02/2020   Personal history of radiation therapy 2019   36 radiation tx   PONV (postoperative nausea and vomiting)    scopolamine patch helps   Recurrent infections 10/05/2018   Right upper lobe pulmonary nodule 01/10/2018   Symptomatic varicose veins, right 09/02/2015   Ulcerative colitis (HCC)    Varicose veins     SURGICAL HISTORY: Past Surgical History:  Procedure Laterality Date   BREAST BIOPSY Right 02/2017   BREAST BIOPSY Right 11/07/2019   BREAST LUMPECTOMY Right 04/24/2017   BREAST LUMPECTOMY WITH RADIOACTIVE SEED AND SENTINEL LYMPH NODE BIOPSY Right 04/24/2017   Procedure: RIGHT BREAST LUMPECTOMY WITH RADIOACTIVE SEED AND SENTINEL LYMPH NODE BIOPSY;  Surgeon: Almond Lint, MD;  Location: [facility];  Service: General;  Laterality: Right;   CORONARY ARTERY BYPASS GRAFT     crapal tunnel relase Left 05/2018   ENDOMETRIAL ABLATION  2010   Had abnormal uterine bleeding (heavy and frequent), performed in [place], menses stopped completely 2 years after   ETHMOIDECTOMY Right 02/16/2023   Procedure: RIGHT TOTAL ETHMOIDECTOMY;  Surgeon: Newman Pies, MD;  Location: [facility] OR;  Service: ENT;  Laterality: Right;   EXPLORATORY LAPAROTOMY     Had exploratory surgery at the age of 33 for abdominal pain with cyst found on her left ovary, not treated surgically   FOOT SURGERY Right 2012   GENERATOR REMOVAL  10/02/2020   Procedure: GENERATOR REMOVAL;  Surgeon: Purcell Nails, MD;  Location: [facility] OR;  Service: Thoracic;;   HAND SURGERY Right    ICD IMPLANT N/A 10/01/2020   Procedure: ICD IMPLANT;  Surgeon: Regan Lemming, MD;  Location: [facility];  Service: Cardiovascular;  Laterality: N/A;   ICD LEAD REMOVAL N/A 10/02/2020   Procedure: ICD LEAD REMOVAL;  Surgeon: Purcell Nails, MD;  Location: [facility] OR;  Service: Thoracic;  Laterality: N/A;   MAXILLARY ANTROSTOMY Right 02/16/2023   Procedure: RIGHT MAXILLARY  ANTROSTOMY WITH TISSUE REMOVAL;  Surgeon: Newman Pies, MD;  Location: [facility] OR;  Service: ENT;  Laterality: Right;   MEDIASTERNOTOMY N/A 10/02/2020   Procedure: MEDIAN STERNOTOMY REPAIR PERFORATED VENTRICLE EVACUATION LEFT HEMOTHORAX;  Surgeon: Purcell Nails, MD;  Location: [facility] OR;  Service: Thoracic;  Laterality: N/A;   RE-EXCISION OF BREAST LUMPECTOMY Right 05/15/2017   Procedure: RE-EXCISION OF BREAST LUMPECTOMY;  Surgeon: Almond Lint, MD;  Location:  SURGERY CENTER;  Service: General;  Laterality: Right;   RIGHT/LEFT HEART CATH AND CORONARY ANGIOGRAPHY N/A 06/21/2020   Procedure: RIGHT/LEFT HEART CATH AND CORONARY ANGIOGRAPHY;  Surgeon: Yvonne Kendall, MD;  Location: [facility];  Service: Cardiovascular;  Laterality: N/A;   ROBOTIC ASSISTED SALPINGO OOPHERECTOMY Bilateral 08/29/2019   Procedure: XI ROBOTIC ASSISTED SALPINGO OOPHORECTOMY;  Surgeon: [name], MD;  Location: [facility];  Service: Gynecology;  Laterality: Bilateral;   Septoplasty with turbinate reduction     SINUS ENDO WITH FUSION Right 02/16/2023   Procedure: SINUS ENDO WITH FUSION;  Surgeon: Newman Pies, MD;  Location: [facility] OR;  Service: ENT;  Laterality: Right;   SPHENOIDECTOMY Right 02/16/2023   Procedure: RIGHT SPHENOIDECTOMY WITH TISSUE REMOVAL;  Surgeon: Newman Pies, MD;  Location: [facility] OR;  Service:  ENT;  Laterality: Right;   TEE WITHOUT CARDIOVERSION N/A 10/02/2020   Procedure: TRANSESOPHAGEAL ECHOCARDIOGRAM (TEE);  Surgeon: Purcell Nails, MD;  Location: [facility] OR;  Service: Thoracic;  Laterality: N/A;   TONSILLECTOMY  1980   TOTAL HIP ARTHROPLASTY Right 12/20/2021   Procedure: RIGHT TOTAL HIP ARTHROPLASTY ANTERIOR APPROACH;  Surgeon: Sheral Apley, MD;  Location: [facility] OR;  Service: Orthopedics;  Laterality: Right;   TOTAL HIP ARTHROPLASTY Left 05/23/2022   Procedure: TOTAL HIP ARTHROPLASTY ANTERIOR APPROACH;  Surgeon: Sheral Apley, MD;  Location: [facility] OR;  Service: Orthopedics;   Laterality: Left;   VEIN SURGERY     Removal Right Leg   WISDOM TOOTH EXTRACTION      I have reviewed the social history and family history with the patient and they are unchanged from previous note.  ALLERGIES:  is allergic to losartan potassium, ace inhibitors, aspirin, ceclor [cefaclor], sulfa antibiotics, and lisinopril.  MEDICATIONS:  Current Outpatient Medications  Medication Sig Dispense Refill   acetaminophen (TYLENOL) 500 MG tablet Take 2 tablets (1,000 mg total) by mouth every 6 (six) hours as needed for moderate pain or mild pain. 30 tablet 0   acyclovir (ZOVIRAX) 400 MG tablet Take 1 tablet (400 mg total) by mouth 3 (three) times daily as needed. 30 tablet 1   Adalimumab 80 MG/0.8ML PNKT Inject 80 mg into the skin every 14 (fourteen) days.     albuterol (VENTOLIN HFA) 108 (90 Base) MCG/ACT inhaler Inhale 2 puffs into the lungs every 6 (six) hours as needed for shortness of breath.     Azelastine HCl 0.15 % SOLN Place 2 sprays into both nostrils 2 (two) times daily.     Calcium Carb-Cholecalciferol (CALCIUM 600 + D PO) Take 1 tablet by mouth daily.     carvedilol (COREG) 12.5 MG tablet TAKE 1.5 TABLETS (18.75 MG TOTAL) BY MOUTH 2 (TWO) TIMES DAILY. 270 tablet 3   cetirizine (ZYRTEC) 10 MG tablet Take 10 mg by mouth 2 (two) times daily.     dapagliflozin propanediol (FARXIGA) 10 MG TABS tablet Take 1 tablet (10 mg total) by mouth daily before breakfast. 30 tablet 11   EPINEPHrine 0.3 mg/0.3 mL IJ SOAJ injection Inject 0.3 mg into the muscle as needed for anaphylaxis.     Hyoscyamine Sulfate 0.375 MG TBCR Take 0.375 mg by mouth 2 (two) times daily as needed (stomach cramping).     icosapent Ethyl (VASCEPA) 1 g capsule Take 2 capsules (2 g total) by mouth 2 (two) times daily. 120 capsule 5   lansoprazole (PREVACID SOLUTAB) 15 MG disintegrating tablet Take 15 mg by mouth 2 (two) times daily before a meal.     levothyroxine (UNITHROID) 75 MCG tablet Take 1 tablet (75 mcg total) by  mouth daily before breakfast. 90 tablet 0   loperamide (IMODIUM A-D) 2 MG tablet Take 2 mg by mouth 4 (four) times daily as needed for diarrhea or loose stools.     MAGNESIUM PO Take 250 mg by mouth daily.     mesalamine (LIALDA) 1.2 g EC tablet Take 2.4 g by mouth in the morning and at bedtime.      metFORMIN (GLUCOPHAGE) 500 MG tablet TAKE 1 TABLET BY MOUTH TWICE A DAY WITH FOOD 180 tablet 3   montelukast (SINGULAIR) 10 MG tablet Take 10 mg by mouth at bedtime.      Multiple Vitamin (MULTIVITAMIN WITH MINERALS) TABS tablet Take 1 tablet by mouth daily.     pantoprazole (PROTONIX) 40 MG  tablet Take 1 tablet (40 mg total) by mouth daily. 30 tablet 3   predniSONE (DELTASONE) 20 MG tablet Take 40 mg daily for 3-5 days 10 tablet 0   promethazine (PHENERGAN) 12.5 MG tablet Take 1 tablet (12.5 mg total) by mouth every 6 (six) hours as needed for nausea or vomiting. 30 tablet 3   rosuvastatin (CRESTOR) 20 MG tablet Take 1 tablet (20 mg total) by mouth daily. 90 tablet 1   sacubitril-valsartan (ENTRESTO) 97-103 MG Take 1 tablet by mouth 2 (two) times daily. 60 tablet 11   spironolactone (ALDACTONE) 25 MG tablet TAKE 1 TABLET (25 MG TOTAL) BY MOUTH DAILY. 90 tablet 3   SYMBICORT 160-4.5 MCG/ACT inhaler INHALE 2 PUFFS INTO THE LUNGS TWICE A DAY 30.6 each 1   triamcinolone (NASACORT) 55 MCG/ACT AERO nasal inhaler Place 2 sprays into the nose daily.     venlafaxine XR (EFFEXOR-XR) 150 MG 24 hr capsule Take 1 capsule (150 mg total) by mouth daily with breakfast. 90 capsule 0   zinc gluconate 50 MG tablet Take 50 mg by mouth daily.     zolpidem (AMBIEN) 5 MG tablet TAKE 1 TABLET BY MOUTH EVERY DAY AT BEDTIME AS NEEDED FOR SLEEP STRENGTH: 5 MG 30 tablet 3   No current facility-administered medications for this visit.    PHYSICAL EXAMINATION: ECOG PERFORMANCE STATUS: 0 - Asymptomatic  Vitals:   07/26/23 1053  BP: 120/72  Pulse: 82  Resp: 18  Temp: 97.6 F (36.4 C)  SpO2: 96%   Wt Readings from  Last 3 Encounters:  07/26/23 174 lb 9.6 oz (79.2 kg)  06/13/23 169 lb 9.6 oz (76.9 kg)  06/04/23 173 lb (78.5 kg)     GENERAL:alert, no distress and comfortable SKIN: skin color, texture, turgor are normal, no rashes or significant lesions EYES: normal, Conjunctiva are pink and non-injected, sclera clear NECK: supple, thyroid normal size, non-tender, without nodularity LYMPH:  no palpable lymphadenopathy in the cervical, axillary  LUNGS: clear to auscultation and percussion with normal breathing effort HEART: regular rate & rhythm and no murmurs and no lower extremity edema ABDOMEN:abdomen soft, non-tender and normal bowel sounds Musculoskeletal:no cyanosis of digits and no clubbing  NEURO: alert & oriented x 3 with fluent speech, no focal motor/sensory deficits Breasts: Breast inspection showed them to be symmetrical with no nipple discharge.  Mild scar tissue at the previous incision in right breast.  Palpation of the breasts and axilla revealed no obvious mass that I could appreciate.   Physical Exam   LABORATORY DATA:  I have reviewed the data as listed    Latest Ref Rng & Units 07/26/2023    9:59 AM 03/02/2023   11:40 AM 02/16/2023   12:28 PM  CBC  WBC 4.0 - 10.5 K/uL 9.6  9.1  8.6   Hemoglobin 12.0 - 15.0 g/dL 16.1  09.6  04.5   Hematocrit 36.0 - 46.0 % 38.8  39.0  42.3   Platelets 150 - 400 K/uL 365  453  308         Latest Ref Rng & Units 07/26/2023    9:59 AM 06/04/2023    3:56 PM 05/23/2023    3:28 PM  CMP  Glucose 70 - 99 mg/dL 409  811    BUN 6 - 20 mg/dL 10  7    Creatinine 9.14 - 1.00 mg/dL 7.82  9.56  2.13   Sodium 135 - 145 mmol/L 137  136    Potassium 3.5 - 5.1 mmol/L  4.2  4.1    Chloride 98 - 111 mmol/L 101  103    CO2 22 - 32 mmol/L 30  24    Calcium 8.9 - 10.3 mg/dL 9.6  9.0    Total Protein 6.5 - 8.1 g/dL 6.7     Total Bilirubin 0.0 - 1.2 mg/dL 0.9     Alkaline Phos 38 - 126 U/L 47     AST 15 - 41 U/L 14     ALT 0 - 44 U/L 17          RADIOGRAPHIC STUDIES: I have personally reviewed the radiological images as listed and agreed with the findings in the report. No results found.    Orders Placed This Encounter  Procedures   MM Digital Screening    Standing Status:   Future    Expected Date:   01/30/2024    Expiration Date:   07/25/2024    Reason for Exam (SYMPTOM  OR DIAGNOSIS REQUIRED):   screening    Is the patient pregnant?:   No    Preferred imaging location?:   [facility]   DG Bone Density    Standing Status:   Future    Expected Date:   01/30/2024    Expiration Date:   07/25/2024    Reason for Exam (SYMPTOM  OR DIAGNOSIS REQUIRED):   screening    Is patient pregnant?:   No    Preferred imaging location?:   [facility]   All questions were answered. The patient knows to call the clinic with any problems, questions or concerns. No barriers to learning was detected. The total time spent in the appointment was 25 minutes.     Malachy Mood, MD 07/26/2023

## 2023-07-27 ENCOUNTER — Other Ambulatory Visit: Payer: Self-pay | Admitting: Family Medicine

## 2023-07-27 ENCOUNTER — Encounter: Payer: Self-pay | Admitting: Family Medicine

## 2023-07-27 DIAGNOSIS — F430 Acute stress reaction: Secondary | ICD-10-CM

## 2023-08-03 ENCOUNTER — Encounter: Payer: Self-pay | Admitting: Physician Assistant

## 2023-08-07 ENCOUNTER — Other Ambulatory Visit (HOSPITAL_COMMUNITY): Payer: Self-pay | Admitting: Cardiology

## 2023-08-08 ENCOUNTER — Other Ambulatory Visit: Payer: Self-pay | Admitting: Family Medicine

## 2023-08-08 DIAGNOSIS — E782 Mixed hyperlipidemia: Secondary | ICD-10-CM

## 2023-08-12 NOTE — Progress Notes (Unsigned)
 [facility] [address], [address] [place], [place] [address] [phone]  Date:  08/13/2023   Name:  Erin Fields   DOB:  1965/06/25   MRN:  [id number]  PCP:  Kaylee Partridge, MD    Chief Complaint: No chief complaint on file.   History of Present Illness:  Erin Fields is a 58 y.o. very pleasant female patient who presents with the following:  Pt seen today with concern of acute illness -history significant for right-sided breast cancer, type 2 diabetes, ulcerative colitis on biologic, nonobstructive coronary artery disease, hypertension, hyperlipidemia, previous history of cardiomyopathy and ICD placement with right ventricular perforation, bilateral hip replacement  Followed by oncology and advanced CHF clinic   I gave her a short course of steroid for cough about a month ago  Patient Active Problem List   Diagnosis Date Noted   Chronic ethmoidal sinusitis 02/23/2023   Chronic sphenoidal sinusitis 02/23/2023   Polyp of nasal sinus 02/16/2023   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   DJD (degenerative joint disease) 12/20/2021   S/P total right hip arthroplasty 12/20/2021   Diabetes mellitus with insulin  therapy (HCC) 10/24/2021   Lymphedema of breast 10/24/2021   Breast cancer (HCC) 05/11/2021   Hemothorax on left 10/02/2020   S/P evacuation of hematoma 10/02/2020   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 10/02/2020   Hypovolemic shock (HCC) 10/02/2020   Anxiety    Coronary artery disease 25% of RCA, 20% intermediate branch, 40% diagonal branch based on cardiac cath from 2022 07/22/2020   Dilated cardiomyopathy (HCC) ejection fraction 25% by echocardiogram from January 2022 04/29/2020   Congestive heart failure (HCC) 04/29/2020   Allergic rhinitis due to animal (cat) (dog) hair and dander 04/27/2020   Allergic rhinitis due to pollen 04/27/2020   Mild persistent asthma, uncomplicated  04/27/2020   PONV (postoperative nausea and vomiting)    Migraines    GERD (gastroesophageal reflux disease)    Environmental allergies    Diabetes mellitus without complication (HCC)    Depression    Colon polyps    B12 deficiency 11/27/2019   IDA (iron  deficiency anemia) 11/06/2019   Recurrent infections 10/05/2018   Anxiety and depression 01/20/2018   Right upper lobe pulmonary nodule 01/10/2018   Nasal polyp, unspecified 01/10/2018   Left sided abdominal pain 12/13/2017   Impaired fasting glucose 12/12/2017   History of adenomatous polyp of colon 11/19/2017   Genetic testing 07/16/2017   Family history of breast cancer    Controlled type 2 diabetes mellitus without complication, without long-term current use of insulin  (HCC) 05/28/2017   Personal history of radiation therapy 2019   Cancer (HCC) 2019   Malignant neoplasm of upper-outer quadrant of right breast in female, 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   Crohn disease (HCC) 11/29/2016   Borderline diabetes 12/17/2015   Ex-smoker 11/18/2015   Chronic venous insufficiency 09/02/2015   Symptomatic varicose veins, right 09/02/2015   Chronic maxillary sinusitis 01/26/2015   Eustachian tube dysfunction 12/04/2014   Other allergic rhinitis 09/15/2014   Dyspnea 09/15/2014   Liver hemangioma 08/24/2014   Elevated triglycerides with high cholesterol 11/26/2013   Insomnia 08/20/2013   Hypertension 08/20/2013   Ulcerative colitis (HCC) 04/20/2013   Oral herpes 04/20/2013   Hyperlipidemia 12/06/2006   HEADACHE 12/06/2006   CERVICAL CANCER, HX OF 12/06/2006   Hypothyroidism 11/29/2006   Asthma 11/29/2006   IRRITABLE BOWEL SYNDROME 11/29/2006  Past Medical History:  Diagnosis Date   Allergic rhinitis due to animal (cat) (dog) hair and dander 04/27/2020   Allergic rhinitis due to pollen 04/27/2020   Anxiety    Anxiety and depression 01/20/2018   Arthritis    Asthma    Asthma, persistent controlled 10/05/2018    B12 deficiency 11/27/2019   Borderline diabetes 12/17/2015   Breast cancer (HCC)    Cancer (HCC) 2019   right breast cancer   CERVICAL CANCER, HX OF 12/06/2006   Qualifier: Diagnosis of  By: Larrie Po MD, John E    Chronic sinusitis 01/26/2015   Chronic venous insufficiency 09/02/2015   Colon polyps    Congestive heart failure (HCC) 04/29/2020   Controlled type 2 diabetes mellitus without complication, without long-term current use of insulin  (HCC) 05/28/2017   Coronary artery disease 25% of RCA, 20% intermediate branch, 40% diagonal branch based on cardiac cath from 2022 07/22/2020   Crohn disease (HCC) 11/29/2016   Depression    Diabetes mellitus without complication (HCC)    type 2   Dilated cardiomyopathy (HCC) ejection fraction 25% by echocardiogram from January 2022 04/29/2020   Dyspnea 09/15/2014   Elevated triglycerides with high cholesterol 11/26/2013   Environmental allergies    Eustachian tube dysfunction 12/04/2014   Ex-smoker 11/18/2015   Family history of breast cancer    Genetic testing 07/16/2017   Negative genetic testing on the multi-cancer panel.  The Multi-Gene Panel offered by [name] includes sequencing and/or deletion duplication testing of the following 83 genes: ALK, APC, ATM, AXIN2,BAP1,  BARD1, BLM, BMPR1A, BRCA1, BRCA2, BRIP1, CASR, CDC73, CDH1, CDK4, CDKN1B, CDKN1C, CDKN2A (p14ARF), CDKN2A (p16INK4a), CEBPA, CHEK2, CTNNA1, DICER1, DIS3L2, EGFR (c.2369C>T, p.Thr790Met variant onl   GERD (gastroesophageal reflux disease)    HEADACHE 12/06/2006   Qualifier: Diagnosis of  By: Larrie Po MD, John E    Hemothorax on left 10/02/2020   History of adenomatous polyp of colon 11/19/2017   Hyperlipidemia    Hypertension    Hypothyroidism    IDA (iron  deficiency anemia) 11/06/2019   Impaired fasting glucose 12/12/2017   Insomnia 08/20/2013   Irritable bowel syndrome 11/29/2006   Qualifier: Diagnosis of  By: Larrie Po MD, John E    Left sided abdominal pain 12/13/2017    Liver hemangioma 08/24/2014   Malignant neoplasm of upper-outer quadrant of right breast in female, 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   Migraines    Mild persistent asthma, uncomplicated 04/27/2020   Nasal polyp, unspecified 01/10/2018   Oral herpes 04/20/2013   Other allergic rhinitis 09/15/2014   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 10/02/2020   Personal history of radiation therapy 2019   36 radiation tx   PONV (postoperative nausea and vomiting)    scopolamine  patch helps   Recurrent infections 10/05/2018   Right upper lobe pulmonary nodule 01/10/2018   Symptomatic varicose veins, right 09/02/2015   Ulcerative colitis (HCC)    Varicose veins     Past Surgical History:  Procedure Laterality Date   BREAST BIOPSY Right 02/2017   BREAST BIOPSY Right 11/07/2019   BREAST LUMPECTOMY Right 04/24/2017   BREAST LUMPECTOMY WITH RADIOACTIVE SEED AND SENTINEL LYMPH NODE BIOPSY Right 04/24/2017   Procedure: RIGHT BREAST LUMPECTOMY WITH RADIOACTIVE SEED AND SENTINEL LYMPH NODE BIOPSY;  Surgeon: Lockie Rima, MD;  Location: [facility];  Service: General;  Laterality: Right;   CORONARY ARTERY BYPASS GRAFT     crapal tunnel relase Left 05/2018   ENDOMETRIAL ABLATION  2010   Had abnormal  uterine bleeding (heavy and frequent), performed in [place], menses stopped completely 2 years after   ETHMOIDECTOMY Right 02/16/2023   Procedure: RIGHT TOTAL ETHMOIDECTOMY;  Surgeon: Reynold Caves, MD;  Location: [facility] OR;  Service: ENT;  Laterality: Right;   EXPLORATORY LAPAROTOMY     Had exploratory surgery at the age of 23 for abdominal pain with cyst found on her left ovary, not treated surgically   FOOT SURGERY Right 2012   GENERATOR REMOVAL  10/02/2020   Procedure: GENERATOR REMOVAL;  Surgeon: Gardenia Jump, MD;  Location: [facility] OR;  Service: Thoracic;;   HAND SURGERY Right    ICD IMPLANT N/A 10/01/2020   Procedure: ICD IMPLANT;  Surgeon: Lei Pump, MD;   Location: [facility];  Service: Cardiovascular;  Laterality: N/A;   ICD LEAD REMOVAL N/A 10/02/2020   Procedure: ICD LEAD REMOVAL;  Surgeon: Gardenia Jump, MD;  Location[facility] OR;  Service: Thoracic;  Laterality: N/A;   MAXILLARY ANTROSTOMY Right 02/16/2023   Procedure: RIGHT MAXILLARY ANTROSTOMY WITH TISSUE REMOVAL;  Surgeon: Reynold Caves, MD;  Location: [facility] OR;  Service: ENT;  Laterality: Right;   MEDIASTERNOTOMY N/A 10/02/2020   Procedure: MEDIAN STERNOTOMY REPAIR PERFORATED VENTRICLE EVACUATION LEFT HEMOTHORAX;  Surgeon: Gardenia Jump, MD;  Location: [facility] OR;  Service: Thoracic;  Laterality: N/A;   RE-EXCISION OF BREAST LUMPECTOMY Right 05/15/2017   Procedure: RE-EXCISION OF BREAST LUMPECTOMY;  Surgeon: Lockie Rima, MD;  Location: [facility];  Service: General;  Laterality: Right;   RIGHT/LEFT HEART CATH AND CORONARY ANGIOGRAPHY N/A 06/21/2020   Procedure: RIGHT/LEFT HEART CATH AND CORONARY ANGIOGRAPHY;  Surgeon: Sammy Crisp, MD;  Location: [facility];  Service: Cardiovascular;  Laterality: N/A;   ROBOTIC ASSISTED SALPINGO OOPHERECTOMY Bilateral 08/29/2019   Procedure: XI ROBOTIC ASSISTED SALPINGO OOPHORECTOMY;  Surgeon: Suzi Essex, MD;  Location: [facility];  Service: Gynecology;  Laterality: Bilateral;   Septoplasty with turbinate reduction     SINUS ENDO WITH FUSION Right 02/16/2023   Procedure: SINUS ENDO WITH FUSION;  Surgeon: Reynold Caves, MD;  Location: [facility] OR;  Service: ENT;  Laterality: Right;   SPHENOIDECTOMY Right 02/16/2023   Procedure: RIGHT SPHENOIDECTOMY WITH TISSUE REMOVAL;  Surgeon: Reynold Caves, MD;  Location: [facility] OR;  Service: ENT;  Laterality: Right;   TEE WITHOUT CARDIOVERSION N/A 10/02/2020   Procedure: TRANSESOPHAGEAL ECHOCARDIOGRAM (TEE);  Surgeon: Gardenia Jump, MD;  Location: [facility] OR;  Service: Thoracic;  Laterality: N/A;   TONSILLECTOMY  1980   TOTAL HIP ARTHROPLASTY Right 12/20/2021   Procedure: RIGHT TOTAL HIP  ARTHROPLASTY ANTERIOR APPROACH;  Surgeon: Saundra Curl, MD;  Location: [facility] OR;  Service: Orthopedics;  Laterality: Right;   TOTAL HIP ARTHROPLASTY Left 05/23/2022   Procedure: TOTAL HIP ARTHROPLASTY ANTERIOR APPROACH;  Surgeon: Saundra Curl, MD;  Location: [facility] OR;  Service: Orthopedics;  Laterality: Left;   VEIN SURGERY     Removal Right Leg   WISDOM TOOTH EXTRACTION      Social History   Tobacco Use   Smoking status: Former    Current packs/day: 0.00    Average packs/day: 3.0 packs/day for 15.0 years (45.0 ttl pk-yrs)    Types: Cigarettes    Start date: 09/15/1978    Quit date: 09/14/1993    Years since quitting: 29.9   Smokeless tobacco: Never   Tobacco comments:    Quit 26 yrs ago  Vaping Use   Vaping status: Never Used  Substance Use Topics   Alcohol use:  Yes    Alcohol/week: 1.0 - 2.0 standard drink of alcohol    Types: 1 - 2 Glasses of wine per week    Comment: occ   Drug use: No    Family History  Problem Relation Age of Onset   Arthritis Mother 79       Deceased   Breast cancer Mother    Lung cancer Mother    Hyperlipidemia Mother    Heart disease Mother    Hypertension Mother    Diabetes Mother    Crohn's disease Mother    Diabetes Maternal Grandfather    Heart disease Maternal Grandfather    Heart attack Maternal Grandfather    Leukemia Maternal Grandfather    Colitis Sister    Leukemia Cousin 3       mat cousin   Lung cancer Father    Brain cancer Father     Allergies  Allergen Reactions   Losartan Potassium Shortness Of Breath   Ace Inhibitors Cough   Aspirin      Uncoated Aspirin  upsets stomach   Ceclor [Cefaclor] Hives    Has tolerated Ancef  in 2022   Sulfa Antibiotics Hives and Other (See Comments)   Lisinopril Cough    Medication list has been reviewed and updated.  Current Outpatient Medications on File Prior to Visit  Medication Sig Dispense Refill   acetaminophen  (TYLENOL ) 500 MG tablet Take 2 tablets (1,000 mg total) by mouth  every 6 (six) hours as needed for moderate pain or mild pain. 30 tablet 0   acyclovir  (ZOVIRAX ) 400 MG tablet Take 1 tablet (400 mg total) by mouth 3 (three) times daily as needed. 30 tablet 1   Adalimumab  80 MG/0.8ML PNKT Inject 80 mg into the skin every 14 (fourteen) days.     albuterol  (VENTOLIN  HFA) 108 (90 Base) MCG/ACT inhaler Inhale 2 puffs into the lungs every 6 (six) hours as needed for shortness of breath.     Azelastine  HCl 0.15 % SOLN Place 2 sprays into both nostrils 2 (two) times daily.     Calcium  Carb-Cholecalciferol (CALCIUM  600 + D PO) Take 1 tablet by mouth daily.     carvedilol  (COREG ) 12.5 MG tablet TAKE 1.5 TABLETS (18.75 MG TOTAL) BY MOUTH 2 (TWO) TIMES DAILY. 270 tablet 3   cetirizine  (ZYRTEC ) 10 MG tablet Take 10 mg by mouth 2 (two) times daily.     dapagliflozin  propanediol (FARXIGA ) 10 MG TABS tablet Take 1 tablet (10 mg total) by mouth daily before breakfast. 30 tablet 11   EPINEPHrine  0.3 mg/0.3 mL IJ SOAJ injection Inject 0.3 mg into the muscle as needed for anaphylaxis.     Hyoscyamine  Sulfate 0.375 MG TBCR Take 0.375 mg by mouth 2 (two) times daily as needed (stomach cramping).     icosapent  Ethyl (VASCEPA ) 1 g capsule TAKE 2 CAPSULES BY MOUTH 2 TIMES DAILY. 200 capsule 5   lansoprazole (PREVACID SOLUTAB) 15 MG disintegrating tablet Take 15 mg by mouth 2 (two) times daily before a meal.     levothyroxine  (UNITHROID ) 75 MCG tablet Take 1 tablet (75 mcg total) by mouth daily before breakfast. 90 tablet 0   loperamide (IMODIUM A-D) 2 MG tablet Take 2 mg by mouth 4 (four) times daily as needed for diarrhea or loose stools.     MAGNESIUM  PO Take 250 mg by mouth daily.     mesalamine  (LIALDA ) 1.2 g EC tablet Take 2.4 g by mouth in the morning and at bedtime.  metFORMIN  (GLUCOPHAGE ) 500 MG tablet TAKE 1 TABLET BY MOUTH TWICE A DAY WITH FOOD 180 tablet 3   montelukast  (SINGULAIR ) 10 MG tablet Take 10 mg by mouth at bedtime.      Multiple Vitamin (MULTIVITAMIN WITH  MINERALS) TABS tablet Take 1 tablet by mouth daily.     pantoprazole  (PROTONIX ) 40 MG tablet Take 1 tablet (40 mg total) by mouth daily. 30 tablet 3   predniSONE  (DELTASONE ) 20 MG tablet Take 40 mg daily for 3-5 days 10 tablet 0   promethazine  (PHENERGAN ) 12.5 MG tablet Take 1 tablet (12.5 mg total) by mouth every 6 (six) hours as needed for nausea or vomiting. 30 tablet 3   rosuvastatin  (CRESTOR ) 20 MG tablet TAKE 1 TABLET BY MOUTH EVERY DAY 90 tablet 1   sacubitril -valsartan  (ENTRESTO ) 97-103 MG Take 1 tablet by mouth 2 (two) times daily. 60 tablet 11   spironolactone  (ALDACTONE ) 25 MG tablet TAKE 1 TABLET (25 MG TOTAL) BY MOUTH DAILY. 90 tablet 3   SYMBICORT  160-4.5 MCG/ACT inhaler INHALE 2 PUFFS INTO THE LUNGS TWICE A DAY 30.6 each 1   triamcinolone  (NASACORT ) 55 MCG/ACT AERO nasal inhaler Place 2 sprays into the nose daily.     venlafaxine  XR (EFFEXOR -XR) 150 MG 24 hr capsule Take 1 capsule (150 mg total) by mouth daily with breakfast. 90 capsule 1   zinc  gluconate 50 MG tablet Take 50 mg by mouth daily.     zolpidem  (AMBIEN ) 5 MG tablet TAKE 1 TABLET BY MOUTH EVERY DAY AT BEDTIME AS NEEDED FOR SLEEP STRENGTH: 5 MG 30 tablet 3   No current facility-administered medications on file prior to visit.    Review of Systems:  As per HPI- otherwise negative.   Physical Examination: There were no vitals filed for this visit. There were no vitals filed for this visit. There is no height or weight on file to calculate BMI. Ideal Body Weight:    GEN: no acute distress. HEENT: Atraumatic, Normocephalic.  Ears and Nose: No external deformity. CV: RRR, No M/G/R. No JVD. No thrill. No extra heart sounds. PULM: CTA B, no wheezes, crackles, rhonchi. No retractions. No resp. distress. No accessory muscle use. ABD: S, NT, ND, +BS. No rebound. No HSM. EXTR: No c/c/e PSYCH: Normally interactive. Conversant.    Assessment and Plan: ***  Signed Gates Kasal, MD

## 2023-08-13 ENCOUNTER — Ambulatory Visit: Admitting: Family Medicine

## 2023-08-13 VITALS — BP 118/60 | HR 86 | Temp 98.2°F | Resp 18 | Ht 63.5 in | Wt 176.0 lb

## 2023-08-13 DIAGNOSIS — J40 Bronchitis, not specified as acute or chronic: Secondary | ICD-10-CM | POA: Diagnosis not present

## 2023-08-13 DIAGNOSIS — R051 Acute cough: Secondary | ICD-10-CM | POA: Diagnosis not present

## 2023-08-13 LAB — POC COVID19 BINAXNOW: SARS Coronavirus 2 Ag: NEGATIVE

## 2023-08-13 MED ORDER — DOXYCYCLINE HYCLATE 100 MG PO CAPS: 100.0000 mg | ORAL_CAPSULE | Freq: Two times a day (BID) | ORAL | 0 refills | Status: DC

## 2023-08-13 MED ORDER — PREDNISONE 20 MG PO TABS: ORAL_TABLET | ORAL | 0 refills | Status: DC

## 2023-08-13 NOTE — Patient Instructions (Signed)
 Good to see you today- I hope you are feeling better soon If you are not improving in the next couple of days please let me know- sooner if you are getting worse

## 2023-08-27 LAB — HM DIABETES EYE EXAM

## 2023-09-02 ENCOUNTER — Other Ambulatory Visit: Payer: Self-pay | Admitting: Family Medicine

## 2023-09-21 NOTE — Progress Notes (Signed)
 [facility] [address] [place], [place] [address] [phone]  Date:  09/24/2023   Name:  Erin Fields   DOB:  [birth year]   MRN:  [id number]  PCP:  Kaylee Partridge, MD    Chief Complaint: No chief complaint on file.   History of Present Illness:  Erin Fields is a 58 y.o. very pleasant female patient who presents with the following:  Patient seen today with concern of a cough -history significant for right-sided breast cancer, type 2 diabetes, ulcerative colitis on biologic, nonobstructive coronary artery disease, hypertension, hyperlipidemia, previous history of cardiomyopathy and ICD placement with right ventricular perforation, bilateral hip replacement  Followed by oncology and advanced CHF clinic  She also uses nocturnal oxygen since with an overnight oximeter in March  Most recent visit with myself was in late April-at that time she also had concern of illness with cough and upper respiratory symptoms.  I treated her with doxycycline  and prednisone  which is typically effective for her  Can update A1c and TSH if she would like  She notes she has been sick for about 5 days now with a productive cough This happens on a regular basis; she tends to get the symptoms about once a month Symptoms actually seem to start with sinus pressure and drainage, but have morphed into a cough She will see ENT later this month -she did previously have sinus surgery for this type of issue She would also like to see pulmonology- she was using nocturnal oxygen but notes it caused spasms in her chest so she stopped using it.  She wonders if there is another oxygen deliver system that she might tolerate better  We also wonder if pulmonology may be able to help with her frequent upper respiratory infection exacerbations Lab Results  Component Value Date   HGBA1C 6.9 (H) 01/01/2023   Humira  infusion Carvedilol  Farxiga   10 Vascepa  Levothyroxine  Lialda  Metformin  500 twice daily Singulair  10, Symbicort  Pantoprazole  Entresto  Venlafaxine   Lab Results  Component Value Date   TSH 1.75 03/28/2023    Patient Active Problem List   Diagnosis Date Noted   Chronic ethmoidal sinusitis 02/23/2023   Chronic sphenoidal sinusitis 02/23/2023   Polyp of nasal sinus 02/16/2023   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   DJD (degenerative joint disease) 12/20/2021   S/P total right hip arthroplasty 12/20/2021   Diabetes mellitus with insulin  therapy (HCC) 10/24/2021   Lymphedema of breast 10/24/2021   Breast cancer (HCC) 05/11/2021   Hemothorax on left 10/02/2020   S/P evacuation of hematoma 10/02/2020   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 10/02/2020   Hypovolemic shock (HCC) 10/02/2020   Anxiety    Coronary artery disease 25% of RCA, 20% intermediate branch, 40% diagonal branch based on cardiac cath from 2022 07/22/2020   Dilated cardiomyopathy (HCC) ejection fraction 25% by echocardiogram from January 2022 04/29/2020   Congestive heart failure (HCC) 04/29/2020   Allergic rhinitis due to animal (cat) (dog) hair and dander 04/27/2020   Allergic rhinitis due to pollen 04/27/2020   Mild persistent asthma, uncomplicated 04/27/2020   PONV (postoperative nausea and vomiting)    Migraines    GERD (gastroesophageal reflux disease)    Environmental allergies    Diabetes mellitus without complication (HCC)    Depression    Colon polyps    B12 deficiency 11/27/2019   IDA (iron  deficiency anemia) 11/06/2019   Recurrent infections 10/05/2018  Anxiety and depression 01/20/2018   Right upper lobe pulmonary nodule 01/10/2018   Nasal polyp, unspecified 01/10/2018   Left sided abdominal pain 12/13/2017   Impaired fasting glucose 12/12/2017   History of adenomatous polyp of colon 11/19/2017   Genetic testing 07/16/2017   Family history of breast cancer    Controlled type 2 diabetes mellitus  without complication, without long-term current use of insulin  (HCC) 05/28/2017   Personal history of radiation therapy 2019   Cancer (HCC) 2019   Malignant neoplasm of upper-outer quadrant of right breast in female, 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   Crohn disease (HCC) 11/29/2016   Borderline diabetes 12/17/2015   Ex-smoker 11/18/2015   Chronic venous insufficiency 09/02/2015   Symptomatic varicose veins, right 09/02/2015   Chronic maxillary sinusitis 01/26/2015   Eustachian tube dysfunction 12/04/2014   Other allergic rhinitis 09/15/2014   Dyspnea 09/15/2014   Liver hemangioma 08/24/2014   Elevated triglycerides with high cholesterol 11/26/2013   Insomnia 08/20/2013   Hypertension 08/20/2013   Ulcerative colitis (HCC) 04/20/2013   Oral herpes 04/20/2013   Hyperlipidemia 12/06/2006   HEADACHE 12/06/2006   CERVICAL CANCER, HX OF 12/06/2006   Hypothyroidism 11/29/2006   Asthma 11/29/2006   IRRITABLE BOWEL SYNDROME 11/29/2006    Past Medical History:  Diagnosis Date   Allergic rhinitis due to animal (cat) (dog) hair and dander 04/27/2020   Allergic rhinitis due to pollen 04/27/2020   Anxiety    Anxiety and depression 01/20/2018   Arthritis    Asthma    Asthma, persistent controlled 10/05/2018   B12 deficiency 11/27/2019   Borderline diabetes 12/17/2015   Breast cancer (HCC)    Cancer (HCC) 2019   right breast cancer   CERVICAL CANCER, HX OF 12/06/2006   Qualifier: Diagnosis of  By: Larrie Po MD, John E    Chronic sinusitis 01/26/2015   Chronic venous insufficiency 09/02/2015   Colon polyps    Congestive heart failure (HCC) 04/29/2020   Controlled type 2 diabetes mellitus without complication, without long-term current use of insulin  (HCC) 05/28/2017   Coronary artery disease 25% of RCA, 20% intermediate branch, 40% diagonal branch based on cardiac cath from 2022 07/22/2020   Crohn disease (HCC) 11/29/2016   Depression    Diabetes mellitus without  complication (HCC)    type 2   Dilated cardiomyopathy (HCC) ejection fraction 25% by echocardiogram from January 2022 04/29/2020   Dyspnea 09/15/2014   Elevated triglycerides with high cholesterol 11/26/2013   Environmental allergies    Eustachian tube dysfunction 12/04/2014   Ex-smoker 11/18/2015   Family history of breast cancer    Genetic testing 07/16/2017   Negative genetic testing on the multi-cancer panel.  The Multi-Gene Panel offered by [name] includes sequencing and/or deletion duplication testing of the following 83 genes: ALK, APC, ATM, AXIN2,BAP1,  BARD1, BLM, BMPR1A, BRCA1, BRCA2, BRIP1, CASR, CDC73, CDH1, CDK4, CDKN1B, CDKN1C, CDKN2A (p14ARF), CDKN2A (p16INK4a), CEBPA, CHEK2, CTNNA1, DICER1, DIS3L2, EGFR (c.2369C>T, p.Thr790Met variant onl   GERD (gastroesophageal reflux disease)    HEADACHE 12/06/2006   Qualifier: Diagnosis of  By: Larrie Po MD, John E    Hemothorax on left 10/02/2020   History of adenomatous polyp of colon 11/19/2017   Hyperlipidemia    Hypertension    Hypothyroidism    IDA (iron  deficiency anemia) 11/06/2019   Impaired fasting glucose 12/12/2017   Insomnia 08/20/2013   Irritable bowel syndrome 11/29/2006   Qualifier: Diagnosis of  By: Larrie Po MD, John E    Left sided abdominal pain 12/13/2017  Liver hemangioma 08/24/2014   Malignant neoplasm of upper-outer quadrant of right breast in female, 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   Migraines    Mild persistent asthma, uncomplicated 04/27/2020   Nasal polyp, unspecified 01/10/2018   Oral herpes 04/20/2013   Other allergic rhinitis 09/15/2014   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 10/02/2020   Personal history of radiation therapy 2019   36 radiation tx   PONV (postoperative nausea and vomiting)    scopolamine  patch helps   Recurrent infections 10/05/2018   Right upper lobe pulmonary nodule 01/10/2018   Symptomatic varicose veins, right 09/02/2015   Ulcerative colitis (HCC)     Varicose veins     Past Surgical History:  Procedure Laterality Date   BREAST BIOPSY Right 02/2017   BREAST BIOPSY Right 11/07/2019   BREAST LUMPECTOMY Right 04/24/2017   BREAST LUMPECTOMY WITH RADIOACTIVE SEED AND SENTINEL LYMPH NODE BIOPSY Right 04/24/2017   Procedure: RIGHT BREAST LUMPECTOMY WITH RADIOACTIVE SEED AND SENTINEL LYMPH NODE BIOPSY;  Surgeon: Lockie Rima, MD;  Location: [facility];  Service: General;  Laterality: Right;   CORONARY ARTERY BYPASS GRAFT     crapal tunnel relase Left 05/2018   ENDOMETRIAL ABLATION  2010   Had abnormal uterine bleeding (heavy and frequent), performed in [place], menses stopped completely 2 years after   ETHMOIDECTOMY Right 02/16/2023   Procedure: RIGHT TOTAL ETHMOIDECTOMY;  Surgeon: Reynold Caves, MD;  Location: [facility] OR;  Service: ENT;  Laterality: Right;   EXPLORATORY LAPAROTOMY     Had exploratory surgery at the age of 46 for abdominal pain with cyst found on her left ovary, not treated surgically   FOOT SURGERY Right 2012   GENERATOR REMOVAL  10/02/2020   Procedure: GENERATOR REMOVAL;  Surgeon: Gardenia Jump, MD;  Location: [facility] OR;  Service: Thoracic;;   HAND SURGERY Right    ICD IMPLANT N/A 10/01/2020   Procedure: ICD IMPLANT;  Surgeon: Lei Pump, MD;  Location: [facility];  Service: Cardiovascular;  Laterality: N/A;   ICD LEAD REMOVAL N/A 10/02/2020   Procedure: ICD LEAD REMOVAL;  Surgeon: Gardenia Jump, MD;  Location: [facility] OR;  Service: Thoracic;  Laterality: N/A;   MAXILLARY ANTROSTOMY Right 02/16/2023   Procedure: RIGHT MAXILLARY ANTROSTOMY WITH TISSUE REMOVAL;  Surgeon: Reynold Caves, MD;  Location: [facility] OR;  Service: ENT;  Laterality: Right;   MEDIASTERNOTOMY N/A 10/02/2020   Procedure: MEDIAN STERNOTOMY REPAIR PERFORATED VENTRICLE EVACUATION LEFT HEMOTHORAX;  Surgeon: Gardenia Jump, MD;  Location: [facility] OR;  Service: Thoracic;  Laterality: N/A;   RE-EXCISION OF BREAST LUMPECTOMY Right 05/15/2017    Procedure: RE-EXCISION OF BREAST LUMPECTOMY;  Surgeon: Lockie Rima, MD;  Location: [facility];  Service: General;  Laterality: Right;   RIGHT/LEFT HEART CATH AND CORONARY ANGIOGRAPHY N/A 06/21/2020   Procedure: RIGHT/LEFT HEART CATH AND CORONARY ANGIOGRAPHY;  Surgeon: Sammy Crisp, MD;  [facility]: [facility];  Service: Cardiovascular;  Laterality: N/A;   ROBOTIC ASSISTED SALPINGO OOPHERECTOMY Bilateral 08/29/2019   Procedure: XI ROBOTIC ASSISTED SALPINGO OOPHORECTOMY;  Surgeon: Suzi Essex, MD;  Location: [facility];  Service: Gynecology;  Laterality: Bilateral;   Septoplasty with turbinate reduction     SINUS ENDO WITH FUSION Right 02/16/2023   Procedure: SINUS ENDO WITH FUSION;  Surgeon: Reynold Caves, MD;  Location: [facility] OR;  Service: ENT;  Laterality: Right;   SPHENOIDECTOMY Right 02/16/2023   Procedure: RIGHT SPHENOIDECTOMY WITH TISSUE REMOVAL;  Surgeon: Reynold Caves, MD;  Location:  [facility] OR;  Service: ENT;  Laterality: Right;   TEE WITHOUT CARDIOVERSION N/A 10/02/2020   Procedure: TRANSESOPHAGEAL ECHOCARDIOGRAM (TEE);  Surgeon: Gardenia Jump, MD;  Location: [facility] OR;  Service: Thoracic;  Laterality: N/A;   TONSILLECTOMY  1980   TOTAL HIP ARTHROPLASTY Right 12/20/2021   Procedure: RIGHT TOTAL HIP ARTHROPLASTY ANTERIOR APPROACH;  Surgeon: Saundra Curl, MD;  Location: [facility] OR;  Service: Orthopedics;  Laterality: Right;   TOTAL HIP ARTHROPLASTY Left 05/23/2022   Procedure: TOTAL HIP ARTHROPLASTY ANTERIOR APPROACH;  Surgeon: Saundra Curl, MD;  Location: [facility] OR;  Service: Orthopedics;  Laterality: Left;   VEIN SURGERY     Removal Right Leg   WISDOM TOOTH EXTRACTION      Social History   Tobacco Use   Smoking status: Former    Current packs/day: 0.00    Average packs/day: 3.0 packs/day for 15.0 years (45.0 ttl pk-yrs)    Types: Cigarettes    Start date: 09/15/1978    Quit date: 09/14/1993    Years since quitting: 30.0   Smokeless tobacco: Never    Tobacco comments:    Quit 26 yrs ago  Vaping Use   Vaping status: Never Used  Substance Use Topics   Alcohol use: Yes    Alcohol/week: 1.0 - 2.0 standard drink of alcohol    Types: 1 - 2 Glasses of wine per week    Comment: occ   Drug use: No    Family History  Problem Relation Age of Onset   Arthritis Mother 65       Deceased   Breast cancer Mother    Lung cancer Mother    Hyperlipidemia Mother    Heart disease Mother    Hypertension Mother    Diabetes Mother    Crohn's disease Mother    Diabetes Maternal Grandfather    Heart disease Maternal Grandfather    Heart attack Maternal Grandfather    Leukemia Maternal Grandfather    Colitis Sister    Leukemia Cousin 3       mat cousin   Lung cancer Father    Brain cancer Father     Allergies  Allergen Reactions   Losartan Potassium Shortness Of Breath   Ace Inhibitors Cough   Aspirin      Uncoated Aspirin  upsets stomach   Ceclor [Cefaclor] Hives    Has tolerated Ancef  in 2022   Sulfa Antibiotics Hives and Other (See Comments)   Lisinopril Cough    Medication list has been reviewed and updated.  Current Outpatient Medications on File Prior to Visit  Medication Sig Dispense Refill   acetaminophen  (TYLENOL ) 500 MG tablet Take 2 tablets (1,000 mg total) by mouth every 6 (six) hours as needed for moderate pain or mild pain. 30 tablet 0   acyclovir  (ZOVIRAX ) 400 MG tablet Take 1 tablet (400 mg total) by mouth 3 (three) times daily as needed. 30 tablet 1   Adalimumab  80 MG/0.8ML PNKT Inject 80 mg into the skin every 14 (fourteen) days.     albuterol  (VENTOLIN  HFA) 108 (90 Base) MCG/ACT inhaler Inhale 2 puffs into the lungs every 6 (six) hours as needed for shortness of breath.     Azelastine  HCl 0.15 % SOLN Place 2 sprays into both nostrils 2 (two) times daily.     Calcium  Carb-Cholecalciferol (CALCIUM  600 + D PO) Take 1 tablet by mouth daily.     carvedilol  (COREG ) 12.5 MG tablet TAKE 1.5 TABLETS (18.75 MG TOTAL) BY MOUTH 2  (TWO)  TIMES DAILY. 270 tablet 3   cetirizine  (ZYRTEC ) 10 MG tablet Take 10 mg by mouth 2 (two) times daily.     dapagliflozin  propanediol (FARXIGA ) 10 MG TABS tablet Take 1 tablet (10 mg total) by mouth daily before breakfast. 30 tablet 11   doxycycline  (VIBRAMYCIN ) 100 MG capsule Take 1 capsule (100 mg total) by mouth 2 (two) times daily. 20 capsule 0   EPINEPHrine  0.3 mg/0.3 mL IJ SOAJ injection Inject 0.3 mg into the muscle as needed for anaphylaxis.     Hyoscyamine  Sulfate 0.375 MG TBCR Take 0.375 mg by mouth 2 (two) times daily as needed (stomach cramping).     icosapent  Ethyl (VASCEPA ) 1 g capsule TAKE 2 CAPSULES BY MOUTH 2 TIMES DAILY. 200 capsule 5   lansoprazole (PREVACID SOLUTAB) 15 MG disintegrating tablet Take 15 mg by mouth 2 (two) times daily before a meal.     levothyroxine  (UNITHROID ) 75 MCG tablet Take 1 tablet (75 mcg total) by mouth daily before breakfast. 90 tablet 0   loperamide (IMODIUM A-D) 2 MG tablet Take 2 mg by mouth 4 (four) times daily as needed for diarrhea or loose stools.     MAGNESIUM  PO Take 250 mg by mouth daily.     mesalamine  (LIALDA ) 1.2 g EC tablet Take 2.4 g by mouth in the morning and at bedtime.      metFORMIN  (GLUCOPHAGE ) 500 MG tablet TAKE 1 TABLET BY MOUTH TWICE A DAY WITH FOOD 180 tablet 3   montelukast  (SINGULAIR ) 10 MG tablet Take 10 mg by mouth at bedtime.      Multiple Vitamin (MULTIVITAMIN WITH MINERALS) TABS tablet Take 1 tablet by mouth daily.     pantoprazole  (PROTONIX ) 40 MG tablet TAKE 1 TABLET BY MOUTH EVERY DAY 90 tablet 1   predniSONE  (DELTASONE ) 20 MG tablet Take 40 mg daily for 3-5 days 10 tablet 0   promethazine  (PHENERGAN ) 12.5 MG tablet Take 1 tablet (12.5 mg total) by mouth every 6 (six) hours as needed for nausea or vomiting. 30 tablet 3   rosuvastatin  (CRESTOR ) 20 MG tablet TAKE 1 TABLET BY MOUTH EVERY DAY 90 tablet 1   sacubitril -valsartan  (ENTRESTO ) 97-103 MG Take 1 tablet by mouth 2 (two) times daily. 60 tablet 11    spironolactone  (ALDACTONE ) 25 MG tablet TAKE 1 TABLET (25 MG TOTAL) BY MOUTH DAILY. 90 tablet 3   SYMBICORT  160-4.5 MCG/ACT inhaler INHALE 2 PUFFS INTO THE LUNGS TWICE A DAY 30.6 each 1   triamcinolone  (NASACORT ) 55 MCG/ACT AERO nasal inhaler Place 2 sprays into the nose daily.     venlafaxine  XR (EFFEXOR -XR) 150 MG 24 hr capsule Take 1 capsule (150 mg total) by mouth daily with breakfast. 90 capsule 1   zinc  gluconate 50 MG tablet Take 50 mg by mouth daily.     zolpidem  (AMBIEN ) 5 MG tablet TAKE 1 TABLET BY MOUTH EVERY DAY AT BEDTIME AS NEEDED FOR SLEEP STRENGTH: 5 MG 30 tablet 3   No current facility-administered medications on file prior to visit.    Review of Systems:  As per HPI- otherwise negative.   Physical Examination: Vitals:   09/24/23 1418  BP: 110/67  Pulse: 79  SpO2: 97%   Vitals:   09/24/23 1418  Weight: 170 lb 3.2 oz (77.2 kg)  Height: 5' 3.5 (1.613 m)   Body mass index is 29.68 kg/m. Ideal Body Weight: Weight in (lb) to have BMI = 25: 143.1  GEN: no acute distress.  Mild overweight, looks well HEENT: Atraumatic,  Normocephalic.  Bilateral TM wnl, oropharynx normal.  PEERL,EOMI.   Ears and Nose: No external deformity. CV: RRR, No M/G/R. No JVD. No thrill. No extra heart sounds. PULM: CTA B, no wheezes, crackles, rhonchi. No retractions. No resp. distress. No accessory muscle use. ABD: S, NT, ND, +BS. No rebound. No HSM. EXTR: No c/c/e PSYCH: Normally interactive. Conversant.    Assessment and Plan: Controlled type 2 diabetes mellitus without complication, without long-term current use of insulin  (HCC) - Plan: Hemoglobin A1c, Blood Glucose Monitoring Suppl DEVI, Glucose Blood (BLOOD GLUCOSE TEST STRIPS) STRP, Lancet Device MISC, Lancets Misc. MISC  Recurrent cough - Plan: Ambulatory referral to Pulmonology, doxycycline  (VIBRAMYCIN ) 100 MG capsule, predniSONE  (DELTASONE ) 20 MG tablet  Thyroid  disorder screening - Plan: TSH  Ulcerative pancolitis without  complication (HCC) - Plan: IgG  Patient here today with recurrent cough, chest congestion, sinus congestion.  Will treat with doxycycline  and a short course of prednisone .  Referral made to pulmonology for concern of frequent infections as well as to advise about her oxygen  Follow-up on diabetes control and screen for thyroid  disorder  History of low IgG level, follow-up today  Lab Results  Component Value Date   HGBA1C 6.9 (H) 01/01/2023     Signed Gates Kasal, MD  Received labs as follows 6/11, message to patient  Results for orders placed or performed in visit on 09/24/23  Hemoglobin A1c   Collection Time: 09/24/23  2:45 PM  Result Value Ref Range   Hgb A1c MFr Bld 7.0 (H) 4.8 - 5.6 %   Est. average glucose Bld gHb Est-mCnc 154 mg/dL  TSH   Collection Time: 09/24/23  2:45 PM  Result Value Ref Range   TSH WILL FOLLOW   IgG   Collection Time: 09/24/23  2:45 PM  Result Value Ref Range   IgG (Immunoglobin G), Serum 581 (L) 586 - 1,602 mg/dL   *Note: Due to a large number of results and/or encounters for the requested time period, some results have not been displayed. A complete set of results can be found in Results Review.

## 2023-09-21 NOTE — Patient Instructions (Addendum)
 It was good to see you again today- I will be in touch with your labs We will work on getting you set up with pulmonology.  Please keep us  posted about how you are feeling

## 2023-09-24 ENCOUNTER — Encounter: Payer: Self-pay | Admitting: Family Medicine

## 2023-09-24 ENCOUNTER — Ambulatory Visit (INDEPENDENT_AMBULATORY_CARE_PROVIDER_SITE_OTHER): Admitting: Family Medicine

## 2023-09-24 VITALS — BP 110/67 | HR 79 | Ht 63.5 in | Wt 170.2 lb

## 2023-09-24 DIAGNOSIS — R058 Other specified cough: Secondary | ICD-10-CM | POA: Diagnosis not present

## 2023-09-24 DIAGNOSIS — E119 Type 2 diabetes mellitus without complications: Secondary | ICD-10-CM

## 2023-09-24 DIAGNOSIS — K5100 Ulcerative (chronic) pancolitis without complications: Secondary | ICD-10-CM

## 2023-09-24 DIAGNOSIS — Z1329 Encounter for screening for other suspected endocrine disorder: Secondary | ICD-10-CM | POA: Diagnosis not present

## 2023-09-24 MED ORDER — LANCETS MISC. MISC: 1.0000 | Freq: Three times a day (TID) | 0 refills | Status: AC

## 2023-09-24 MED ORDER — BLOOD GLUCOSE MONITORING SUPPL DEVI: 1.0000 | Freq: Three times a day (TID) | 0 refills | Status: AC

## 2023-09-24 MED ORDER — DOXYCYCLINE HYCLATE 100 MG PO CAPS: 100.0000 mg | ORAL_CAPSULE | Freq: Two times a day (BID) | ORAL | 0 refills | Status: DC

## 2023-09-24 MED ORDER — PREDNISONE 20 MG PO TABS
ORAL_TABLET | ORAL | 0 refills | Status: DC
Start: 2023-09-24 — End: 2024-02-14

## 2023-09-24 MED ORDER — LANCET DEVICE MISC: 1.0000 | Freq: Three times a day (TID) | 0 refills | Status: AC

## 2023-09-24 MED ORDER — BLOOD GLUCOSE TEST VI STRP: 1.0000 | ORAL_STRIP | Freq: Three times a day (TID) | 0 refills | Status: DC

## 2023-09-26 ENCOUNTER — Encounter: Payer: Self-pay | Admitting: Family Medicine

## 2023-09-27 ENCOUNTER — Encounter: Payer: Self-pay | Admitting: Family Medicine

## 2023-09-27 LAB — HEMOGLOBIN A1C
Est. average glucose Bld gHb Est-mCnc: 154 mg/dL
Hgb A1c MFr Bld: 7 % — ABNORMAL HIGH (ref 4.8–5.6)

## 2023-09-27 LAB — IGG: IgG (Immunoglobin G), Serum: 581 mg/dL — ABNORMAL LOW (ref 586–1602)

## 2023-09-27 LAB — TSH: TSH: 0.771 u[IU]/mL (ref 0.450–4.500)

## 2023-10-06 ENCOUNTER — Other Ambulatory Visit: Payer: Self-pay | Admitting: Family Medicine

## 2023-10-06 DIAGNOSIS — E034 Atrophy of thyroid (acquired): Secondary | ICD-10-CM

## 2023-10-11 ENCOUNTER — Ambulatory Visit (INDEPENDENT_AMBULATORY_CARE_PROVIDER_SITE_OTHER): Payer: BC Managed Care – PPO | Admitting: Otolaryngology

## 2023-10-11 ENCOUNTER — Encounter (INDEPENDENT_AMBULATORY_CARE_PROVIDER_SITE_OTHER): Payer: Self-pay | Admitting: Otolaryngology

## 2023-10-11 VITALS — BP 114/75 | HR 82 | Ht 63.5 in | Wt 170.0 lb

## 2023-10-11 DIAGNOSIS — J320 Chronic maxillary sinusitis: Secondary | ICD-10-CM

## 2023-10-11 DIAGNOSIS — J322 Chronic ethmoidal sinusitis: Secondary | ICD-10-CM

## 2023-10-11 DIAGNOSIS — J323 Chronic sphenoidal sinusitis: Secondary | ICD-10-CM | POA: Diagnosis not present

## 2023-10-11 NOTE — Progress Notes (Signed)
 Patient ID: Erin Fields, female   DOB: 29-Aug-1965, 58 y.o.   MRN: [id number]  Follow-up: Persistent right-sided fungal sinusitis  Procedure: Right sided nasal/sinus debridement   Indication: The patient is a 58 year old female who returns today for her follow-up evaluation.  The patient previously underwent right endoscopic sinus surgery to treat her chronic right maxillary, ethmoid, and sphenoid sinusitis and polyposis on February 16, 2023.  Fungus balls were noted within the right sphenoid sinus.  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  The patient was treated with medicated (mupirocin/budesonide /amphotericin B) nasal rinse.  The patient returns today complaining of more recurrent right nasal crusting and congestion.  She was able to flush out large chunks of crusting and fungal debris from her right nasal cavity.  Currently the patient denies any facial pain, fever, or visual change.    Anesthesia: Topical Xylocaine  and Neo-Synephrine.   Description: The patient is placed upright in the exam chair.  The right nasal cavity is sprayed with topical Xylocaine  and Neo-Synephrine.  A 0 rigid endoscope is used for the examination. The scope is advanced past the right nostril into the right nasal cavity. A moderate amount of crusting and fungal debris are noted within the right nasal cavity, the maxillary/ethmoid cavities, and the sphenoid cavity.  The crusting is removed with suction catheters and alligator forceps, which are inserted in parallel with the rigid endoscope.  After the debridement procedure, the maxillary antrum and the ethmoid cavities are noted to be widely patent.  The sphenoid opening is also patent.  The patient tolerated the procedure well.    Follow up care: The patient is instructed to perform medicated (mupirocin/budesonide /amphotericin B) nasal rinse daily when symptomatic.  The patient will return for re-evaluation in 6 months, sooner if needed.

## 2023-10-13 ENCOUNTER — Other Ambulatory Visit: Payer: Self-pay | Admitting: Family Medicine

## 2023-10-26 ENCOUNTER — Inpatient Hospital Stay: Payer: Self-pay

## 2023-11-05 ENCOUNTER — Ambulatory Visit (HOSPITAL_COMMUNITY)
Admission: RE | Admit: 2023-11-05 | Discharge: 2023-11-05 | Disposition: A | Discharge status: Home / Self Care | Source: Ambulatory Visit | Attending: Cardiology | Admitting: Cardiology

## 2023-11-05 ENCOUNTER — Ambulatory Visit (HOSPITAL_COMMUNITY): Payer: Self-pay | Admitting: Cardiology

## 2023-11-05 ENCOUNTER — Encounter (HOSPITAL_COMMUNITY): Payer: Self-pay | Admitting: Cardiology

## 2023-11-05 VITALS — BP 94/68 | HR 76 | Ht 63.5 in | Wt 168.0 lb

## 2023-11-05 DIAGNOSIS — E785 Hyperlipidemia, unspecified: Secondary | ICD-10-CM | POA: Diagnosis not present

## 2023-11-05 DIAGNOSIS — Z853 Personal history of malignant neoplasm of breast: Secondary | ICD-10-CM | POA: Insufficient documentation

## 2023-11-05 DIAGNOSIS — J45909 Unspecified asthma, uncomplicated: Secondary | ICD-10-CM | POA: Insufficient documentation

## 2023-11-05 DIAGNOSIS — I110 Hypertensive heart disease with heart failure: Secondary | ICD-10-CM | POA: Diagnosis not present

## 2023-11-05 DIAGNOSIS — Z8249 Family history of ischemic heart disease and other diseases of the circulatory system: Secondary | ICD-10-CM | POA: Insufficient documentation

## 2023-11-05 DIAGNOSIS — Z95810 Presence of automatic (implantable) cardiac defibrillator: Secondary | ICD-10-CM | POA: Diagnosis not present

## 2023-11-05 DIAGNOSIS — I2510 Atherosclerotic heart disease of native coronary artery without angina pectoris: Secondary | ICD-10-CM | POA: Insufficient documentation

## 2023-11-05 DIAGNOSIS — K5190 Ulcerative colitis, unspecified, without complications: Secondary | ICD-10-CM | POA: Insufficient documentation

## 2023-11-05 DIAGNOSIS — I5022 Chronic systolic (congestive) heart failure: Secondary | ICD-10-CM | POA: Diagnosis present

## 2023-11-05 DIAGNOSIS — I428 Other cardiomyopathies: Secondary | ICD-10-CM | POA: Insufficient documentation

## 2023-11-05 DIAGNOSIS — Z79899 Other long term (current) drug therapy: Secondary | ICD-10-CM | POA: Diagnosis not present

## 2023-11-05 DIAGNOSIS — M1611 Unilateral primary osteoarthritis, right hip: Secondary | ICD-10-CM | POA: Diagnosis not present

## 2023-11-05 LAB — BASIC METABOLIC PANEL WITH GFR
Anion gap: 9 (ref 5–15)
BUN: 12 mg/dL (ref 6–20)
CO2: 23 mmol/L (ref 22–32)
Calcium: 9.9 mg/dL (ref 8.9–10.3)
Chloride: 106 mmol/L (ref 98–111)
Creatinine, Ser: 0.87 mg/dL (ref 0.44–1.00)
GFR, Estimated: >60 mL/min (ref >60)
Glucose, Bld: 144 mg/dL — ABNORMAL HIGH (ref 70–99)
Potassium: 5.1 mmol/L (ref 3.5–5.1)
Sodium: 138 mmol/L (ref 135–145)

## 2023-11-05 LAB — LIPID PANEL
Cholesterol: 119 mg/dL (ref 0–200)
HDL: 40 mg/dL — ABNORMAL LOW (ref >40)
LDL Cholesterol: 47 mg/dL (ref 0–99)
Total CHOL/HDL Ratio: 3 ratio
Triglycerides: 160 mg/dL — ABNORMAL HIGH (ref <150)
VLDL: 32 mg/dL (ref 0–40)

## 2023-11-05 NOTE — Patient Instructions (Signed)
 There has been no changes to your medications.  Labs done today, your results will be available in MyChart, we will contact you for abnormal readings.  Repeat blood work in 3 months.  Your physician has requested that you have an echocardiogram. Echocardiography is a painless test that uses sound waves to create images of your heart. It provides your doctor with information about the size and shape of your heart and how well your heart's chambers and valves are working. This procedure takes approximately one hour. There are no restrictions for this procedure. Please do NOT wear cologne, perfume, aftershave, or lotions (deodorant is allowed). Please arrive 15 minutes prior to your appointment time.  Please note: We ask at that you not bring children with you during ultrasound (echo/ vascular) testing. Due to room size and safety concerns, children are not allowed in the ultrasound rooms during exams. Our front office staff cannot provide observation of children in our lobby area while testing is being conducted. An adult accompanying a patient to their appointment will only be allowed in the ultrasound room at the discretion of the ultrasound technician under special circumstances. We apologize for any inconvenience.  Your physician recommends that you schedule a follow-up appointment in: 6 months.  If you have any questions or concerns before your next appointment please send us  a message through [place] or call our office at [phone].    TO LEAVE A MESSAGE FOR THE NURSE SELECT OPTION 2, PLEASE LEAVE A MESSAGE INCLUDING: YOUR NAME DATE OF BIRTH CALL BACK NUMBER REASON FOR CALL**this is important as we prioritize the call backs  YOU WILL RECEIVE A CALL BACK THE SAME DAY AS LONG AS YOU CALL BEFORE 4:00 PM  At the [facility], you and your health needs are our priority. As part of our continuing mission to provide you with exceptional heart care, we have created designated  Provider Care Teams. These Care Teams include your primary Cardiologist (physician) and Advanced Practice Providers (APPs- Physician Assistants and Nurse Practitioners) who all work together to provide you with the care you need, when you need it.   You may see any of the following providers on your designated Care Team at your next follow up: Dr Toribio Fuel Dr Ezra Shuck Dr. Ria Commander Dr. Morene Brownie Amy Lenetta, NP Caffie Shed, [place] [facility], [place] Beckey Coe, NP [place] Lee, NP Ellouise Class, NP Tinnie Redman, PharmD Jaun Bash, PharmD   Please be sure to bring in all your medications bottles to every appointment.    Thank you for choosing [facility]

## 2023-11-05 NOTE — Progress Notes (Signed)
 PCP: Dr. Watt Cardiology: Dr. Bernie HF Cardiology: Dr. Rolan  Chief complaint: CHF  58 y.o. with history of ulcerative colitis, HTN, and breast cancer. Patient's breast cancer was treated with surgery and radiation, no chemo.  Patient reports exertional dyspnea since 2019.  Patient has right hip OA, needs THR.  Echo was done in 1/22 as part of pre-op workup, this showed low EF 25%.  She then had RHC/LHC in 3/22 with mild nonobstructive CAD and low cardiac index 2.1.  Echo in 4/22 showed EF still 20-25%.  She was referred for ICD.  St Jude ICD was placed in 6/22 but complicated by RV perforation with hemothorax and hemorrhagic shock.  ICD was removed and RV was surgically repaired with sternotomy.  Patient's mother had CHF in her 44s, uncertain etiology.   Cardiac MRI in 9/22 showed EF 35% with moderate LV hypokinesis, RV EF 44%, small area of LGE at the true apex (?small embolic apical infarct).    Echo in 2/23 showed EF 40-45%, diffuse hypokinesis, mildly decreased RV systolic function.  Echo 10/24 showed EF 45% with septal bounce consistent with prior sternotomy, mild RV dysfunction, normal RV.   Today she returns for HF follow up. She continues on Humira  for ulcerative colitis.  No dyspnea walking on flat ground.  Knees hurt when she walks up stairs but minimally short of breath.  She has asthma and wheezes at times.  No chest pain.  No orthopnea/PND. Weight down 5 lbs. BP is relatively low but no lightheadedness.   ECG (personally reviewed): NSR, anterolateral TWIs  Labs (8/23): K 4.1, creatinine 0.8 Labs (2/24): K 4.5, creatinine 0.67 Labs (7/24): K 4.5, creatinine 0.76 Labs (12/24): K 4.9, creatinine 0.64 Labs (4/25): K 4.2, creatinine 0.64  PMH: 1.  Cervical cancer 2.  B12 deficiency 3.  Asthma 4.  Type 2 diabetes 5.  Ulcerative colitis 6.  HTN 7.  Hypothyroidism 8.  Irritable bowel syndrome 9.  Breast cancer: Right-sided, surgery and radiation but no chemotherapy.  10.   Chronic systolic CHF: Nonischemic cardiomyopathy.  - Echo (1/22): EF 25% - LHC/RHC (3/22): Mild nonobstructive CAD; mean RA 3, PA 24/11, CI 2.1.  - Echo (4/22): EF 20-25%, mild LV dilation, normal RV.  - Echo (6/22): EF 30-35%  - St Jude ICD placed 6/22, complicated by RV perforation requiring sternotomy due to hemothorax/hemorrhagic shock, removal of ICD and surgical repair of RV perforation.  - Cardiac MRI (9/22): EF 35% with moderate LV hypokinesis, RV EF 44%, small area of LGE at the true apex (?small embolic apical infarct).   - Echo (2/23): EF 40-45%, diffuse hypokinesis, mildly decreased RV systolic function. - Echo (12/23): EF 60-65%, mild RV dysfunction.  - Echo (10/24): EF 45% with septal bounce consistent with prior sternotomy, mild RV dysfunction, normal RV.  11.  OA: s/p right and left hip THR 12.  COVID-19 12/21  FH: Grandfather with MI, mother with CHF in her 20s.    SH: Lives in [place], nonsmoker, rare ETOH.   ROS: All systems reviewed and negative except as per HPI.  Current Outpatient Medications  Medication Sig Dispense Refill   acetaminophen  (TYLENOL ) 500 MG tablet Take 2 tablets (1,000 mg total) by mouth every 6 (six) hours as needed for moderate pain or mild pain. 30 tablet 0   acyclovir  (ZOVIRAX ) 400 MG tablet Take 1 tablet (400 mg total) by mouth 3 (three) times daily as needed. 30 tablet 1   Adalimumab  80 MG/0.8ML PNKT Inject 80 mg  into the skin every 14 (fourteen) days.     albuterol  (VENTOLIN  HFA) 108 (90 Base) MCG/ACT inhaler Inhale 2 puffs into the lungs every 6 (six) hours as needed for shortness of breath.     Azelastine  HCl 0.15 % SOLN Place 2 sprays into both nostrils 2 (two) times daily.     Blood Glucose Monitoring Suppl DEVI 1 each by Does not apply route in the morning, at noon, and at bedtime. May substitute to any manufacturer covered by patient's insurance. 1 each 0   Calcium  Carb-Cholecalciferol (CALCIUM  600 + D PO) Take 1 tablet by mouth daily.      carvedilol  (COREG ) 12.5 MG tablet TAKE 1.5 TABLETS (18.75 MG TOTAL) BY MOUTH 2 (TWO) TIMES DAILY. 270 tablet 3   cetirizine  (ZYRTEC ) 10 MG tablet Take 10 mg by mouth 2 (two) times daily.     dapagliflozin  propanediol (FARXIGA ) 10 MG TABS tablet Take 1 tablet (10 mg total) by mouth daily before breakfast. 30 tablet 11   EPINEPHrine  0.3 mg/0.3 mL IJ SOAJ injection Inject 0.3 mg into the muscle as needed for anaphylaxis.     Hyoscyamine  Sulfate 0.375 MG TBCR Take 0.375 mg by mouth 2 (two) times daily as needed (stomach cramping).     icosapent  Ethyl (VASCEPA ) 1 g capsule TAKE 2 CAPSULES BY MOUTH 2 TIMES DAILY. 200 capsule 5   lansoprazole (PREVACID SOLUTAB) 15 MG disintegrating tablet Take 15 mg by mouth 2 (two) times daily before a meal.     levothyroxine  (SYNTHROID ) 75 MCG tablet TAKE 1 TABLET BY MOUTH DAILY BEFORE BREAKFAST. 90 tablet 0   loperamide (IMODIUM A-D) 2 MG tablet Take 2 mg by mouth 4 (four) times daily as needed for diarrhea or loose stools.     MAGNESIUM  PO Take 250 mg by mouth daily.     mesalamine  (LIALDA ) 1.2 g EC tablet Take 2.4 g by mouth in the morning and at bedtime.      metFORMIN  (GLUCOPHAGE ) 500 MG tablet Take 1 tablet (500 mg total) by mouth 2 (two) times daily with a meal. 180 tablet 1   montelukast  (SINGULAIR ) 10 MG tablet Take 10 mg by mouth at bedtime.      Multiple Vitamin (MULTIVITAMIN WITH MINERALS) TABS tablet Take 1 tablet by mouth daily.     pantoprazole  (PROTONIX ) 40 MG tablet TAKE 1 TABLET BY MOUTH EVERY DAY 90 tablet 1   promethazine  (PHENERGAN ) 12.5 MG tablet Take 1 tablet (12.5 mg total) by mouth every 6 (six) hours as needed for nausea or vomiting. 30 tablet 3   rosuvastatin  (CRESTOR ) 20 MG tablet TAKE 1 TABLET BY MOUTH EVERY DAY 90 tablet 1   sacubitril -valsartan  (ENTRESTO ) 97-103 MG Take 1 tablet by mouth 2 (two) times daily. 60 tablet 11   spironolactone  (ALDACTONE ) 25 MG tablet TAKE 1 TABLET (25 MG TOTAL) BY MOUTH DAILY. 90 tablet 3   SYMBICORT   160-4.5 MCG/ACT inhaler INHALE 2 PUFFS INTO THE LUNGS TWICE A DAY 30.6 each 1   triamcinolone  (NASACORT ) 55 MCG/ACT AERO nasal inhaler Place 2 sprays into the nose daily.     venlafaxine  XR (EFFEXOR -XR) 150 MG 24 hr capsule Take 1 capsule (150 mg total) by mouth daily with breakfast. 90 capsule 1   zinc  gluconate 50 MG tablet Take 50 mg by mouth daily.     zolpidem  (AMBIEN ) 5 MG tablet TAKE 1 TABLET BY MOUTH EVERY DAY AT BEDTIME AS NEEDED FOR SLEEP STRENGTH: 5 MG 30 tablet 3   doxycycline  (VIBRAMYCIN ) 100 MG  capsule Take 1 capsule (100 mg total) by mouth 2 (two) times daily. 20 capsule 0   doxycycline  (VIBRAMYCIN ) 100 MG capsule Take 1 capsule (100 mg total) by mouth 2 (two) times daily. 20 capsule 0   predniSONE  (DELTASONE ) 20 MG tablet Take 40 mg daily for 3-5 days 10 tablet 0   predniSONE  (DELTASONE ) 20 MG tablet Take 40 mg by mouth daily for 3 days, then 20 mg by mouth daily for 3 days longer if needed 9 tablet 0   No current facility-administered medications for this encounter.   Wt Readings from Last 3 Encounters:  11/05/23 76.2 kg (168 lb)  10/11/23 77.1 kg (170 lb)  09/24/23 77.2 kg (170 lb 3.2 oz)   BP 94/68   Pulse 76   Ht 5' 3.5 (1.613 m)   Wt 76.2 kg (168 lb)   SpO2 97%   BMI 29.29 kg/m  General: NAD Neck: No JVD, no thyromegaly or thyroid  nodule.  Lungs: Clear to auscultation bilaterally with normal respiratory effort. CV: Nondisplaced PMI.  Heart regular S1/S2, no S3/S4, no murmur.  No peripheral edema.  No carotid bruit.  Normal pedal pulses.  Abdomen: Soft, nontender, no hepatosplenomegaly, no distention.  Skin: Intact without lesions or rashes.  Neurologic: Alert and oriented x 3.  Psych: Normal affect. Extremities: No clubbing or cyanosis.  HEENT: Normal.   Assessment/Plan: 1. Chronic systolic CHF: Nonischemic cardiomyopathy.  EF noted to be low since 1/22.  LHC in 3/22 with mild nonobstructive coronary disease.  Last echo in 6/22 with EF 30-35%.  Cardic MRI in  9/22 showed EF 35% with moderate LV hypokinesis, RV EF 44%, small area of LGE at the true apex (?small embolic apical infarct).  Echo in 2/23 showed EF up to 40-45%.  Echo 10/24 showed EF 45% with septal bounce consistent with prior sternotomy, mild RV dysfunction, normal RV. Cause of cardiomyopathy uncertain, her mother had a cardiomyopathy of uncertain etiology so cannot rule out familial cardiomyopathy.  She had treatment for breast cancer but this did not include chemotherapy.  She has been on adalimumab  for ulcerative colitis, but this does not commonly cause CHF.  No drugs and rare ETOH.  The small area of apical LGE on cardiac MRI does not explain the cardiomyopathy.  NYHA class II, not volume overloaded on exam.  - Continue Entresto  97/103 bid.  BMET today - Continue Coreg  18.75 mg bid, no BP room today to increase.  - Continue dapagliflozin  10 mg daily.  - Continue spironolactone  25 mg daily.   - I will arrange for repeat echo in 10/25.  - Echo with EF out of ICD range in 10/24.  2. RV perforation with ICD placement: S/p sternotomy/repair in 6/22.    3. Hyperlipidemia: Check lipids today.   BMET 3 months. Followup in 6 months with APP.   I spent 31 minutes reviewing records, interviewing/examining patient, and managing orders.   Ezra Shuck 11/05/2023

## 2023-11-12 ENCOUNTER — Other Ambulatory Visit: Payer: Self-pay | Admitting: Family Medicine

## 2023-11-12 DIAGNOSIS — F5104 Psychophysiologic insomnia: Secondary | ICD-10-CM

## 2023-11-13 ENCOUNTER — Ambulatory Visit: Admitting: Cardiology

## 2023-11-26 ENCOUNTER — Other Ambulatory Visit: Payer: Self-pay | Admitting: Family Medicine

## 2023-11-26 DIAGNOSIS — E119 Type 2 diabetes mellitus without complications: Secondary | ICD-10-CM

## 2023-11-28 ENCOUNTER — Ambulatory Visit: Admitting: Cardiology

## 2023-11-29 NOTE — Patient Instructions (Incomplete)
 Good to see you again today

## 2023-11-29 NOTE — Progress Notes (Unsigned)
 [facility] [address], [address] [place], [place] [address] [phone]  Date:  12/03/2023   Name:  Erin Fields   DOB:  [birth year]   MRN:  [id number]  PCP:  Watt Harlene BROCKS, MD    Chief Complaint: Sinus Problem (Onset a couple of weeks ago/Cough and sinus infection )   History of Present Illness:  Erin Fields is a 58 y.o. very pleasant female patient who presents with the following:   Pt seen today for concern of a cough and possible sinus infection-she has chronic sinus disease and gets regular infections  She has noted sx for about 2 weeks - cough, runny nose, sinus congestion She had one day of chills and possible fever, body aches- this just lasted one day and resolved.  However she still has sinus congestion and pressure No vomiting or diarrhea She did test for covid and was negative   Last seen by me in June -history significant for right-sided breast cancer, type 2 diabetes, ulcerative colitis on biologic, nonobstructive coronary artery disease, hypertension, hyperlipidemia, previous history of cardiomyopathy and ICD placement with right ventricular perforation, bilateral hip replacement  Followed by oncology and advanced CHF clinic  She also uses nocturnal oxygen since with an overnight oximeter in March  Last A1c showed good control of DM Lab Results  Component Value Date   HGBA1C 7.0 (H) 09/24/2023   Eye exam; Dr Elma at [facility] eye -I have requested report Flu shot recommended this fall Needs urine micro   Patient Active Problem List   Diagnosis Date Noted   Chronic ethmoidal sinusitis 02/23/2023   Chronic sphenoidal sinusitis 02/23/2023   Polyp of nasal sinus 02/16/2023   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   DJD (degenerative joint disease) 12/20/2021   S/P total right hip arthroplasty 12/20/2021   Diabetes mellitus with insulin  therapy (HCC) 10/24/2021   Lymphedema of  breast 10/24/2021   Breast cancer (HCC) 05/11/2021   Hemothorax on left 10/02/2020   S/P evacuation of hematoma 10/02/2020   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 10/02/2020   Hypovolemic shock (HCC) 10/02/2020   Anxiety    Coronary artery disease 25% of RCA, 20% intermediate branch, 40% diagonal branch based on cardiac cath from 2022 07/22/2020   Dilated cardiomyopathy (HCC) ejection fraction 25% by echocardiogram from January 2022 04/29/2020   Congestive heart failure (HCC) 04/29/2020   Allergic rhinitis due to animal (cat) (dog) hair and dander 04/27/2020   Allergic rhinitis due to pollen 04/27/2020   Mild persistent asthma, uncomplicated 04/27/2020   PONV (postoperative nausea and vomiting)    Migraines    GERD (gastroesophageal reflux disease)    Environmental allergies    Diabetes mellitus without complication (HCC)    Depression    Colon polyps    B12 deficiency 11/27/2019   IDA (iron  deficiency anemia) 11/06/2019   Recurrent infections 10/05/2018   Anxiety and depression 01/20/2018   Right upper lobe pulmonary nodule 01/10/2018   Nasal polyp, unspecified 01/10/2018   Left sided abdominal pain 12/13/2017   Impaired fasting glucose 12/12/2017   History of adenomatous polyp of colon 11/19/2017   Genetic testing 07/16/2017   Family history of breast cancer    Controlled type 2 diabetes mellitus without complication, without long-term current use of insulin  (HCC) 05/28/2017   Personal history of radiation therapy 2019   Cancer (HCC) 2019   Malignant neoplasm of upper-outer quadrant of right breast  in female, 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   Crohn disease (HCC) 11/29/2016   Borderline diabetes 12/17/2015   Ex-smoker 11/18/2015   Chronic venous insufficiency 09/02/2015   Symptomatic varicose veins, right 09/02/2015   Chronic maxillary sinusitis 01/26/2015   Eustachian tube dysfunction 12/04/2014   Other allergic rhinitis 09/15/2014   Dyspnea  09/15/2014   Liver hemangioma 08/24/2014   Elevated triglycerides with high cholesterol 11/26/2013   Insomnia 08/20/2013   Hypertension 08/20/2013   Ulcerative colitis (HCC) 04/20/2013   Oral herpes 04/20/2013   Hyperlipidemia 12/06/2006   HEADACHE 12/06/2006   CERVICAL CANCER, HX OF 12/06/2006   Hypothyroidism 11/29/2006   Asthma 11/29/2006   IRRITABLE BOWEL SYNDROME 11/29/2006    Past Medical History:  Diagnosis Date   Allergic rhinitis due to animal (cat) (dog) hair and dander 04/27/2020   Allergic rhinitis due to pollen 04/27/2020   Anxiety    Anxiety and depression 01/20/2018   Arthritis    Asthma    Asthma, persistent controlled 10/05/2018   B12 deficiency 11/27/2019   Borderline diabetes 12/17/2015   Breast cancer (HCC)    Cancer (HCC) 2019   right breast cancer   CERVICAL CANCER, HX OF 12/06/2006   Qualifier: Diagnosis of  By: Mavis MD, John E    Chronic sinusitis 01/26/2015   Chronic venous insufficiency 09/02/2015   Colon polyps    Congestive heart failure (HCC) 04/29/2020   Controlled type 2 diabetes mellitus without complication, without long-term current use of insulin (HCC) 05/28/2017   Coronary artery disease 25% of RCA, 20% intermediate branch, 40% diagonal branch based on cardiac cath from 2022 07/22/2020   Crohn disease (HCC) 11/29/2016   Depression    Diabetes mellitus without complication (HCC)    type 2   Dilated cardiomyopathy (HCC) ejection fraction 25% by echocardiogram from January 2022 04/29/2020   Dyspnea 09/15/2014   Elevated triglycerides with high cholesterol 11/26/2013   Environmental allergies    Eustachian tube dysfunction 12/04/2014   Ex-smoker 11/18/2015   Family history of breast cancer    Genetic testing 07/16/2017   Negative genetic testing on the multi-cancer panel.  The Multi-Gene Panel offered by [name] includes sequencing and/or deletion duplication testing of the following 83 genes: ALK, APC, ATM, AXIN2,BAP1,  BARD1, BLM,  BMPR1A, BRCA1, BRCA2, BRIP1, CASR, CDC73, CDH1, CDK4, CDKN1B, CDKN1C, CDKN2A (p14ARF), CDKN2A (p16INK4a), CEBPA, CHEK2, CTNNA1, DICER1, DIS3L2, EGFR (c.2369C>T, p.Thr790Met variant onl   GERD (gastroesophageal reflux disease)    HEADACHE 12/06/2006   Qualifier: Diagnosis of  By: Mavis MD, John E    Hemothorax on left 10/02/2020   History of adenomatous polyp of colon 11/19/2017   Hyperlipidemia    Hypertension    Hypothyroidism    IDA (iron deficiency anemia) 11/06/2019   Impaired fasting glucose 12/12/2017   Insomnia 08/20/2013   Irritable bowel syndrome 11/29/2006   Qualifier: Diagnosis of  By: Mavis MD, John E    Left sided abdominal pain 12/13/2017   Liver hemangioma 08/24/2014   Malignant neoplasm of upper-outer quadrant of right breast in female, 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   Migraines    Mild persistent asthma, uncomplicated 04/27/2020   Nasal polyp, unspecified 01/10/2018   Oral herpes 04/20/2013   Other allergic rhinitis 09/15/2014   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 10/02/2020   Personal history of radiation therapy 2019   36 radiation tx   PONV (postoperative nausea and vomiting)    scopolamine patch helps   Recurrent infections 10/05/2018   Right  upper lobe pulmonary nodule 01/10/2018   Symptomatic varicose veins, right 09/02/2015   Ulcerative colitis (HCC)    Varicose veins     Past Surgical History:  Procedure Laterality Date   BREAST BIOPSY Right 02/2017   BREAST BIOPSY Right 11/07/2019   BREAST LUMPECTOMY Right 04/24/2017   BREAST LUMPECTOMY WITH RADIOACTIVE SEED AND SENTINEL LYMPH NODE BIOPSY Right 04/24/2017   Procedure: RIGHT BREAST LUMPECTOMY WITH RADIOACTIVE SEED AND SENTINEL LYMPH NODE BIOPSY;  Surgeon: Aron Shoulders, MD;  Location: [facility];  Service: General;  Laterality: Right;   CORONARY ARTERY BYPASS GRAFT     crapal tunnel relase Left 05/2018   ENDOMETRIAL ABLATION  2010   Had abnormal  uterine bleeding (heavy and frequent), performed in [place], menses stopped completely 2 years after   ETHMOIDECTOMY Right 02/16/2023   Procedure: RIGHT TOTAL ETHMOIDECTOMY;  Surgeon: Karis Clunes, MD;  Location: [facility] OR;  Service: ENT;  Laterality: Right;   EXPLORATORY LAPAROTOMY     Had exploratory surgery at the age of 58 for abdominal pain with cyst found on her left ovary, not treated surgically   FOOT SURGERY Right 2012   GENERATOR REMOVAL  10/02/2020   Procedure: GENERATOR REMOVAL;  Surgeon: Dusty Sudie DEL, MD;  Location: [facility] OR;  Service: Thoracic;;   HAND SURGERY Right    ICD IMPLANT N/A 10/01/2020   Procedure: ICD IMPLANT;  Surgeon: Inocencio Soyla Lunger, MD;  Location: [facility];  Service: Cardiovascular;  Laterality: N/A;   ICD LEAD REMOVAL N/A 10/02/2020   Procedure: ICD LEAD REMOVAL;  Surgeon: Dusty Sudie DEL, MD;  Location: [facility] OR;  Service: Thoracic;  Laterality: N/A;   MAXILLARY ANTROSTOMY Right 02/16/2023   Procedure: RIGHT MAXILLARY ANTROSTOMY WITH TISSUE REMOVAL;  Surgeon: Karis Clunes, MD;  Location: [facility] OR;  Service: ENT;  Laterality: Right;   MEDIASTERNOTOMY N/A 10/02/2020   Procedure: MEDIAN STERNOTOMY REPAIR PERFORATED VENTRICLE EVACUATION LEFT HEMOTHORAX;  Surgeon: Dusty Sudie DEL, MD;  Location: [facility] OR;  Service: Thoracic;  Laterality: N/A;   RE-EXCISION OF BREAST LUMPECTOMY Right 05/15/2017   Procedure: RE-EXCISION OF BREAST LUMPECTOMY;  Surgeon: Aron Shoulders, MD;  Location: [facility];  Service: General;  Laterality: Right;   RIGHT/LEFT HEART CATH AND CORONARY ANGIOGRAPHY N/A 06/21/2020   Procedure: RIGHT/LEFT HEART CATH AND CORONARY ANGIOGRAPHY;  Surgeon: Mady Bruckner, MD;  Location: [facility];  Service: Cardiovascular;  Laterality: N/A;   ROBOTIC ASSISTED SALPINGO OOPHERECTOMY Bilateral 08/29/2019   Procedure: XI ROBOTIC ASSISTED SALPINGO OOPHORECTOMY;  Surgeon: Viktoria Comer SAUNDERS, MD;  Location: [facility];  Service:  Gynecology;  Laterality: Bilateral;   Septoplasty with turbinate reduction     SINUS ENDO WITH FUSION Right 02/16/2023   Procedure: SINUS ENDO WITH FUSION;  Surgeon: Karis Clunes, MD;  Location[facility] OR;  Service: ENT;  Laterality: Right;   SPHENOIDECTOMY Right 02/16/2023   Procedure: RIGHT SPHENOIDECTOMY WITH TISSUE REMOVAL;  Surgeon: Karis Clunes, MD;  Location: [facility] OR;  Service: ENT;  Laterality: Right;   TEE WITHOUT CARDIOVERSION N/A 10/02/2020   Procedure: TRANSESOPHAGEAL ECHOCARDIOGRAM (TEE);  Surgeon: Dusty Sudie DEL, MD;  Location: [facility] OR;  Service: Thoracic;  Laterality: N/A;   TONSILLECTOMY  1980   TOTAL HIP ARTHROPLASTY Right 12/20/2021   Procedure: RIGHT TOTAL HIP ARTHROPLASTY ANTERIOR APPROACH;  Surgeon: Beverley Evalene BIRCH, MD;  Location: [facility] OR;  Service: Orthopedics;  Laterality: Right;   TOTAL HIP ARTHROPLASTY Left 05/23/2022   Procedure: TOTAL HIP ARTHROPLASTY ANTERIOR APPROACH;  Surgeon: Beverley,  Evalene BIRCH, MD;  Location: [facility] OR;  Service: Orthopedics;  Laterality: Left;   VEIN SURGERY     Removal Right Leg   WISDOM TOOTH EXTRACTION      Social History   Tobacco Use   Smoking status: Former    Current packs/day: 0.00    Average packs/day: 3.0 packs/day for 15.0 years (45.0 ttl pk-yrs)    Types: Cigarettes    Start date: 09/15/1978    Quit date: 09/14/1993    Years since quitting: 30.2   Smokeless tobacco: Never   Tobacco comments:    Quit 26 yrs ago  Vaping Use   Vaping status: Never Used  Substance Use Topics   Alcohol use: Yes    Alcohol/week: 1.0 - 2.0 standard drink of alcohol    Types: 1 - 2 Glasses of wine per week    Comment: occ   Drug use: No    Family History  Problem Relation Age of Onset   Arthritis Mother 64       Deceased   Breast cancer Mother    Lung cancer Mother    Hyperlipidemia Mother    Heart disease Mother    Hypertension Mother    Diabetes Mother    Crohn's disease Mother    Diabetes Maternal Grandfather    Heart disease Maternal Grandfather     Heart attack Maternal Grandfather    Leukemia Maternal Grandfather    Colitis Sister    Leukemia Cousin 3       mat cousin   Lung cancer Father    Brain cancer Father     Allergies  Allergen Reactions   Losartan Potassium Shortness Of Breath   Ace Inhibitors Cough   Aspirin     Uncoated Aspirin upsets stomach   Ceclor [Cefaclor] Hives    Has tolerated Ancef in 2022   Sulfa Antibiotics Hives and Other (See Comments)   Lisinopril Cough    Medication list has been reviewed and updated.  Current Outpatient Medications on File Prior to Visit  Medication Sig Dispense Refill   ACCU-CHEK GUIDE TEST test strip USE AS DIRECTED IN THE MORNING, AT NOON, AND AT BEDTIME. 100 strip 0   acetaminophen (TYLENOL) 500 MG tablet Take 2 tablets (1,000 mg total) by mouth every 6 (six) hours as needed for moderate pain or mild pain. 30 tablet 0   Adalimumab 80 MG/0.8ML PNKT Inject 80 mg into the skin every 14 (fourteen) days.     albuterol (VENTOLIN HFA) 108 (90 Base) MCG/ACT inhaler Inhale 2 puffs into the lungs every 6 (six) hours as needed for shortness of breath.     Azelastine HCl 0.15 % SOLN Place 2 sprays into both nostrils 2 (two) times daily.     Blood Glucose Monitoring Suppl DEVI 1 each by Does not apply route in the morning, at noon, and at bedtime. May substitute to any manufacturer covered by patient's insurance. 1 each 0   Calcium Carb-Cholecalciferol (CALCIUM 600 + D PO) Take 1 tablet by mouth daily.     carvedilol (COREG) 12.5 MG tablet TAKE 1.5 TABLETS (18.75 MG TOTAL) BY MOUTH 2 (TWO) TIMES DAILY. 270 tablet 3   cetirizine (ZYRTEC) 10 MG tablet Take 10 mg by mouth 2 (two) times daily.     dapagliflozin propanediol (FARXIGA) 10 MG TABS tablet Take 1 tablet (10 mg total) by mouth daily before breakfast. 30 tablet 11   EPINEPHrine 0.3 mg/0.3 mL IJ SOAJ injection Inject 0.3 mg into the muscle as  needed for anaphylaxis.     Hyoscyamine Sulfate 0.375 MG TBCR Take 0.375 mg by mouth 2 (two)  times daily as needed (stomach cramping).     icosapent Ethyl (VASCEPA) 1 g capsule TAKE 2 CAPSULES BY MOUTH 2 TIMES DAILY. 200 capsule 5   lansoprazole (PREVACID SOLUTAB) 15 MG disintegrating tablet Take 15 mg by mouth 2 (two) times daily before a meal.     levothyroxine (SYNTHROID) 75 MCG tablet TAKE 1 TABLET BY MOUTH DAILY BEFORE BREAKFAST. 90 tablet 0   loperamide (IMODIUM A-D) 2 MG tablet Take 2 mg by mouth 4 (four) times daily as needed for diarrhea or loose stools.     MAGNESIUM PO Take 250 mg by mouth daily.     mesalamine (LIALDA) 1.2 g EC tablet Take 2.4 g by mouth in the morning and at bedtime.      metFORMIN (GLUCOPHAGE) 500 MG tablet Take 1 tablet (500 mg total) by mouth 2 (two) times daily with a meal. 180 tablet 1   montelukast (SINGULAIR) 10 MG tablet Take 10 mg by mouth at bedtime.      Multiple Vitamin (MULTIVITAMIN WITH MINERALS) TABS tablet Take 1 tablet by mouth daily.     pantoprazole (PROTONIX) 40 MG tablet TAKE 1 TABLET BY MOUTH EVERY DAY 90 tablet 1   predniSONE (DELTASONE) 20 MG tablet Take 40 mg by mouth daily for 3 days, then 20 mg by mouth daily for 3 days longer if needed 9 tablet 0   promethazine (PHENERGAN) 12.5 MG tablet Take 1 tablet (12.5 mg total) by mouth every 6 (six) hours as needed for nausea or vomiting. 30 tablet 3   rosuvastatin (CRESTOR) 20 MG tablet TAKE 1 TABLET BY MOUTH EVERY DAY 90 tablet 1   sacubitril-valsartan (ENTRESTO) 97-103 MG Take 1 tablet by mouth 2 (two) times daily. 60 tablet 11   spironolactone (ALDACTONE) 25 MG tablet TAKE 1 TABLET (25 MG TOTAL) BY MOUTH DAILY. 90 tablet 3   SYMBICORT 160-4.5 MCG/ACT inhaler INHALE 2 PUFFS INTO THE LUNGS TWICE A DAY 30.6 each 1   triamcinolone (NASACORT) 55 MCG/ACT AERO nasal inhaler Place 2 sprays into the nose daily.     venlafaxine XR (EFFEXOR-XR) 150 MG 24 hr capsule Take 1 capsule (150 mg total) by mouth daily with breakfast. 90 capsule 1   zinc gluconate 50 MG tablet Take 50 mg by mouth daily.      zolpidem (AMBIEN) 5 MG tablet TAKE 1 TABLET BY MOUTH EVERY DAY AT BEDTIME AS NEEDED FOR SLEEP STRENGTH: 5 MG 30 tablet 3   acyclovir (ZOVIRAX) 400 MG tablet Take 1 tablet (400 mg total) by mouth 3 (three) times daily as needed. (Patient not taking: Reported on 12/03/2023) 30 tablet 1   doxycycline (VIBRAMYCIN) 100 MG capsule Take 1 capsule (100 mg total) by mouth 2 (two) times daily. (Patient not taking: Reported on 12/03/2023) 20 capsule 0   doxycycline (VIBRAMYCIN) 100 MG capsule Take 1 capsule (100 mg total) by mouth 2 (two) times daily. (Patient not taking: Reported on 12/03/2023) 20 capsule 0   predniSONE (DELTASONE) 20 MG tablet Take 40 mg daily for 3-5 days (Patient not taking: Reported on 12/03/2023) 10 tablet 0   No current facility-administered medications on file prior to visit.    Review of Systems:  As per HPI- otherwise negative.   Physical Examination: Vitals:   12/03/23 0826  BP: 106/72  Pulse: 86  Temp: 98.1 F (36.7 C)  SpO2: 94%   Vitals:  12/03/23 0826  Weight: 166 lb 9.6 oz (75.6 kg)  Height: 5' 3.5 (1.613 m)   Body mass index is 29.05 kg/m. Ideal Body Weight: Weight in (lb) to have BMI = 25: 143.1  GEN: no acute distress.  Overweight, looks well and her normal self HEENT: Atraumatic, Normocephalic.  Bilateral TM wnl, oropharynx normal.  PEERL,EOMI. nasal cavity is inflamed with increased mucus Ears and Nose: No external deformity. CV: RRR, No M/G/R. No JVD. No thrill. No extra heart sounds. PULM: CTA B, no wheezes, crackles, rhonchi. No retractions. No resp. distress. No accessory muscle use. EXTR: No c/c/e PSYCH: Normally interactive. Conversant.   BP Readings from Last 3 Encounters:  12/03/23 106/72  11/05/23 94/68  10/11/23 114/75    Assessment and Plan: Controlled type 2 diabetes mellitus without complication, without long-term current use of insulin (HCC) - Plan: Microalbumin / creatinine urine ratio  Acute recurrent frontal sinusitis - Plan:  amoxicillin (AMOXIL) 875 MG tablet, predniSONE (DELTASONE) 20 MG tablet  Patient seen today for recurrent sinusitis.  She reports her ENT doctor tells her this will likely be a chronic recurrent issue for her.  Because she does not have any chest symptoms at this time we will treat her with plain amoxicillin, she confirms she can take this medication without allergy.  She also typically likes to use a few days of prednisone. She will let me know if not improving in a few days  Catch up on urine micro today  Signed Harlene Schroeder, MD  Received urine micro, message to pt  Results for orders placed or performed in visit on 12/03/23  Microalbumin / creatinine urine ratio   Collection Time: 12/03/23  9:00 AM  Result Value Ref Range   Microalb, Ur 2.9 (H) 0.0 - 1.9 mg/dL   Creatinine,U 43.3 mg/dL   Microalb Creat Ratio 50.7 (H) 0.0 - 30.0 mg/g   *Note: Due to a large number of results and/or encounters for the requested time period, some results have not been displayed. A complete set of results can be found in Results Review.

## 2023-12-03 ENCOUNTER — Encounter: Payer: Self-pay | Admitting: Family Medicine

## 2023-12-03 ENCOUNTER — Ambulatory Visit: Admitting: Family Medicine

## 2023-12-03 VITALS — BP 106/72 | HR 86 | Temp 98.1°F | Ht 63.5 in | Wt 166.6 lb

## 2023-12-03 DIAGNOSIS — J0111 Acute recurrent frontal sinusitis: Secondary | ICD-10-CM | POA: Diagnosis not present

## 2023-12-03 DIAGNOSIS — R809 Proteinuria, unspecified: Secondary | ICD-10-CM | POA: Diagnosis not present

## 2023-12-03 DIAGNOSIS — E1129 Type 2 diabetes mellitus with other diabetic kidney complication: Secondary | ICD-10-CM | POA: Diagnosis not present

## 2023-12-03 DIAGNOSIS — E119 Type 2 diabetes mellitus without complications: Secondary | ICD-10-CM

## 2023-12-03 LAB — MICROALBUMIN / CREATININE URINE RATIO
Creatinine,U: 56.6 mg/dL
Microalb Creat Ratio: 50.7 mg/g — ABNORMAL HIGH (ref 0.0–30.0)
Microalb, Ur: 2.9 mg/dL — ABNORMAL HIGH (ref 0.0–1.9)

## 2023-12-03 MED ORDER — PREDNISONE 20 MG PO TABS: ORAL_TABLET | ORAL | 0 refills | Status: DC

## 2023-12-03 MED ORDER — AMOXICILLIN 875 MG PO TABS
875.0000 mg | ORAL_TABLET | Freq: Two times a day (BID) | ORAL | 0 refills | Status: DC
Start: 2023-12-03 — End: 2024-02-14

## 2023-12-10 ENCOUNTER — Ambulatory Visit: Admitting: Cardiology

## 2023-12-12 ENCOUNTER — Encounter: Payer: Self-pay | Admitting: Pulmonary Disease

## 2023-12-12 ENCOUNTER — Ambulatory Visit: Admitting: Pulmonary Disease

## 2023-12-12 VITALS — BP 108/70 | HR 99 | Temp 98.9°F | Ht 63.0 in | Wt 170.0 lb

## 2023-12-12 DIAGNOSIS — J4540 Moderate persistent asthma, uncomplicated: Secondary | ICD-10-CM | POA: Diagnosis not present

## 2023-12-12 DIAGNOSIS — R0683 Snoring: Secondary | ICD-10-CM | POA: Diagnosis not present

## 2023-12-12 LAB — CBC WITH DIFFERENTIAL/PLATELET
Basophils Absolute: 0.1 K/uL (ref 0.0–0.1)
Basophils Relative: 0.8 % (ref 0.0–3.0)
Eosinophils Absolute: 0.4 K/uL (ref 0.0–0.7)
Eosinophils Relative: 4.7 % (ref 0.0–5.0)
HCT: 41.7 % (ref 36.0–46.0)
Hemoglobin: 13.7 g/dL (ref 12.0–15.0)
Lymphocytes Relative: 34.4 % (ref 12.0–46.0)
Lymphs Abs: 3.1 K/uL (ref 0.7–4.0)
MCHC: 32.8 g/dL (ref 30.0–36.0)
MCV: 89.6 fl (ref 78.0–100.0)
Monocytes Absolute: 1 K/uL (ref 0.1–1.0)
Monocytes Relative: 11.6 % (ref 3.0–12.0)
Neutro Abs: 4.3 K/uL (ref 1.4–7.7)
Neutrophils Relative %: 48.5 % (ref 43.0–77.0)
Platelets: 351 K/uL (ref 150.0–400.0)
RBC: 4.65 Mil/uL (ref 3.87–5.11)
RDW: 13.4 % (ref 11.5–15.5)
WBC: 8.9 K/uL (ref 4.0–10.5)

## 2023-12-12 NOTE — Progress Notes (Unsigned)
 Erin Fields    [id number]    23-Jun-1965  Primary Care Physician:Copland, Erin BROCKS, MD  Referring Physician: Watt Erin BROCKS, MD [address] [place],  [place] [address]  Chief complaint: Consult for hypoxia  History of Present Illness Erin Fields is a 58 year old female with CHF, GERD, diabetes, hypothyroidism asthma and chronic cough who presents with concerns about low oxygen levels during sleep. She was referred by her primary care physician for evaluation of low oxygen levels during sleep.  Nocturnal hypoxemia - Referred for evaluation of low oxygen levels during sleep - Overnight oximetry in March 2025 showed oxygen saturation dropping below 88% for approximately 12 minutes during an 8-hour period - Significant fatigue upon waking - Oxygen therapy at 2 L/min during sleep was initiated but discontinued due to severe pain and cough with spasms  Chronic cough and asthma exacerbations - Chronic cough with episodes every 4-6 weeks requiring steroid treatment - Asthma managed with Symbicort , Singulair , Nasacort , Astepro , and weekly allergy  shots - Asthma flares are seasonal, with increased symptoms in March, April, and August - Currently on antibiotics for a recent asthma flare - Cough and phlegm production believed to be exacerbated by heart failure medications  Dyspnea and fatigue - Dyspnea associated with asthma and heart failure  Medication intolerance - Oxygen therapy at 2 L/min during sleep caused severe pain and cough with spasms, leading to discontinuation  Gastroesophageal reflux symptoms - Diagnosed with acid reflux - On Pepcid , Protonix , and famotidine  - Scheduled for endoscopy in December 2025 to assess for esophageal stricture  Heart failure symptoms - History of heart failure with previous ejection fraction of 25%, now improved to 45% - On heart failure medications, which are believed to contribute to cough and phlegm  production - Requires periodic steroid use to manage symptoms related to cough and phlegm   Relevant Pulmonary history: Pets: 2 cats Occupation: Works for [employer] Exposures: No mold, hot tub, Financial controller.  No feather pillows or comforters No h/o chemo/XRT/amiodarone/macrodantin /MTX  No exposure to asbestos, silica or other organic allergens  Smoking history: Significant smoking history: up to three packs per day for 15 years. Quit smoking in 1995 Travel history: No significant travel history Family history:- Mother had lung cancer and emphysema   Outpatient Encounter Medications as of 12/12/2023  Medication Sig   ACCU-CHEK GUIDE TEST test strip USE AS DIRECTED IN THE MORNING, AT NOON, AND AT BEDTIME.   acetaminophen  (TYLENOL ) 500 MG tablet Take 2 tablets (1,000 mg total) by mouth every 6 (six) hours as needed for moderate pain or mild pain.   acyclovir  (ZOVIRAX ) 400 MG tablet Take 1 tablet (400 mg total) by mouth 3 (three) times daily as needed.   Adalimumab  80 MG/0.8ML PNKT Inject 80 mg into the skin every 14 (fourteen) days.   albuterol  (VENTOLIN  HFA) 108 (90 Base) MCG/ACT inhaler Inhale 2 puffs into the lungs every 6 (six) hours as needed for shortness of breath.   amoxicillin  (AMOXIL ) 875 MG tablet Take 1 tablet (875 mg total) by mouth 2 (two) times daily.   Azelastine  HCl 0.15 % SOLN Place 2 sprays into both nostrils 2 (two) times daily.   Blood Glucose Monitoring Suppl DEVI 1 each by Does not apply route in the morning, at noon, and at bedtime. May substitute to any manufacturer covered by patient's insurance.   Calcium  Carb-Cholecalciferol (CALCIUM  600 + D PO) Take 1 tablet by mouth  daily.   carvedilol  (COREG ) 12.5 MG tablet TAKE 1.5 TABLETS (18.75 MG TOTAL) BY MOUTH 2 (TWO) TIMES DAILY.   cetirizine  (ZYRTEC ) 10 MG tablet Take 10 mg by mouth 2 (two) times daily.   dapagliflozin  propanediol (FARXIGA ) 10 MG TABS tablet Take 1 tablet (10 mg total) by mouth daily before  breakfast.   EPINEPHrine  0.3 mg/0.3 mL IJ SOAJ injection Inject 0.3 mg into the muscle as needed for anaphylaxis.   Hyoscyamine  Sulfate 0.375 MG TBCR Take 0.375 mg by mouth 2 (two) times daily as needed (stomach cramping).   icosapent  Ethyl (VASCEPA ) 1 g capsule TAKE 2 CAPSULES BY MOUTH 2 TIMES DAILY.   levothyroxine  (SYNTHROID ) 75 MCG tablet TAKE 1 TABLET BY MOUTH DAILY BEFORE BREAKFAST.   loperamide (IMODIUM A-D) 2 MG tablet Take 2 mg by mouth 4 (four) times daily as needed for diarrhea or loose stools.   MAGNESIUM  PO Take 250 mg by mouth daily.   mesalamine  (LIALDA ) 1.2 g EC tablet Take 2.4 g by mouth in the morning and at bedtime.    metFORMIN  (GLUCOPHAGE ) 500 MG tablet Take 1 tablet (500 mg total) by mouth 2 (two) times daily with a meal.   montelukast  (SINGULAIR ) 10 MG tablet Take 10 mg by mouth at bedtime.    Multiple Vitamin (MULTIVITAMIN WITH MINERALS) TABS tablet Take 1 tablet by mouth daily.   pantoprazole  (PROTONIX ) 40 MG tablet TAKE 1 TABLET BY MOUTH EVERY DAY   promethazine  (PHENERGAN ) 12.5 MG tablet Take 1 tablet (12.5 mg total) by mouth every 6 (six) hours as needed for nausea or vomiting.   rosuvastatin  (CRESTOR ) 20 MG tablet TAKE 1 TABLET BY MOUTH EVERY DAY   sacubitril -valsartan  (ENTRESTO ) 97-103 MG Take 1 tablet by mouth 2 (two) times daily.   spironolactone  (ALDACTONE ) 25 MG tablet TAKE 1 TABLET (25 MG TOTAL) BY MOUTH DAILY.   SYMBICORT  160-4.5 MCG/ACT inhaler INHALE 2 PUFFS INTO THE LUNGS TWICE A DAY   triamcinolone  (NASACORT ) 55 MCG/ACT AERO nasal inhaler Place 2 sprays into the nose daily.   venlafaxine  XR (EFFEXOR -XR) 150 MG 24 hr capsule Take 1 capsule (150 mg total) by mouth daily with breakfast.   zinc  gluconate 50 MG tablet Take 50 mg by mouth daily.   zolpidem  (AMBIEN ) 5 MG tablet TAKE 1 TABLET BY MOUTH EVERY DAY AT BEDTIME AS NEEDED FOR SLEEP STRENGTH: 5 MG   doxycycline  (VIBRAMYCIN ) 100 MG capsule Take 1 capsule (100 mg total) by mouth 2 (two) times daily.  (Patient not taking: Reported on 12/12/2023)   doxycycline  (VIBRAMYCIN ) 100 MG capsule Take 1 capsule (100 mg total) by mouth 2 (two) times daily. (Patient not taking: Reported on 12/12/2023)   lansoprazole (PREVACID SOLUTAB) 15 MG disintegrating tablet Take 15 mg by mouth 2 (two) times daily before a meal. (Patient not taking: Reported on 12/12/2023)   predniSONE  (DELTASONE ) 20 MG tablet Take 40 mg daily for 3-5 days (Patient not taking: Reported on 12/12/2023)   predniSONE  (DELTASONE ) 20 MG tablet Take 40 mg by mouth daily for 3 days, then 20 mg by mouth daily for 3 days longer if needed (Patient not taking: Reported on 12/12/2023)   predniSONE  (DELTASONE ) 20 MG tablet Take 40 mg by mouth daily for 3-5 days (Patient not taking: Reported on 12/12/2023)   No facility-administered encounter medications on file as of 12/12/2023.     Physical Exam: Today's Vitals   12/12/23 1430  BP: 108/70  Pulse: 99  Temp: 98.9 F (37.2 C)  TempSrc: Oral  SpO2: 96%  Weight: 170 lb (77.1 kg)  Height: 5' 3 (1.6 m)   Body mass index is 30.11 kg/m.  Physical Exam GEN: No acute distress. CV: Regular rate and rhythm, no murmurs. LUNGS: Clear to auscultation bilaterally, no wheezing, normal respiratory effort. SKIN JOINTS: Warm and dry, no rash.    Data Reviewed: Imaging: CT chest 06/05/2023-no evidence of lung infiltrate, nodule.  Mild centrilobular emphysema with minimal anterior lingular scarring.  Esophageal fluid levels, age advanced coronary atherosclerosis. I have reviewed the images personally.  PFTs:  Labs:  Sleep Overnight oximetry 07/06/2023 Duration of study 8 hours Time spent less than 88% total minutes 49 seconds Assessment & Plan Chronic cough and congestion due to asthma, allergic rhinitis, and gastroesophageal reflux disease Chronic cough and congestion are attributed to asthma, allergic rhinitis, and gastroesophageal reflux disease. Asthma is managed with Symbicort , Singulair , and  allergy  shots. Acid reflux is managed with Pepcid , Protonix , and famotidine . CT scan indicates esophageal dysmotility. Heart failure medications may also contribute to cough. Current regimen is effective, but flare-ups occur every 4-6 weeks requiring steroids and antibiotics. Asthma-specific injections were considered, but potential interactions with Humira  and heart failure medications were noted. - Continue asthma and allergy  management with Symbicort , Singulair , and allergy  shots. - Continue acid reflux management with Pepcid , Protonix , and famotidine . - Order blood tests to evaluate for potential asthma-specific biologic injections. - Manage recurrent flare-ups with steroids and antibiotics as needed.  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 Symptoms suggestive of obstructive sleep apnea, including snoring and periodic dips in oxygen levels. Previous sleep study over 10 years ago was negative for sleep apnea. Current symptoms may indicate development of sleep apnea, potentially contributing to cough and congestion. Oxygen therapy was not tolerated due to spasms and pain. - Order home sleep study to evaluate for obstructive sleep apnea. - Consider CPAP if sleep apnea is confirmed.  Heart failure with reduced ejection fraction Heart failure with previously reduced ejection fraction, now improved to 45%. Managed with heart failure medications, some of which may contribute to cough. - Continue current heart failure management. - Monitor for medication-related cough.   Recommendations: Check CBC with differential, IgE Home sleep study PFTs  Lonna Coder MD [facility] 12/12/2023, 2:44 PM  CC: Copland, Erin BROCKS, MD

## 2023-12-12 NOTE — Patient Instructions (Signed)
  VISIT SUMMARY: Today, we discussed your concerns about low oxygen levels during sleep, chronic cough, asthma, and other related symptoms. We reviewed your current medications and management plans for asthma, acid reflux, and heart failure. We also discussed the potential for obstructive sleep apnea and the need for further evaluation.  YOUR PLAN: -CHRONIC COUGH AND CONGESTION DUE TO ASTHMA, ALLERGIC RHINITIS, AND GASTROESOPHAGEAL REFLUX DISEASE: Your chronic cough and congestion are due to asthma, allergies, and acid reflux. We will continue your current medications: Symbicort , Singulair , and allergy  shots for asthma, and Pepcid , Protonix , and famotidine  for acid reflux. We will also order blood tests to see if asthma-specific injections might be beneficial for you. If you have flare-ups, continue to use steroids and antibiotics as needed.  -NOCTURNAL HYPOXEMIA WITH SUSPECTED OBSTRUCTIVE SLEEP APNEA: Nocturnal hypoxemia means low oxygen levels during sleep, which may be due to obstructive sleep apnea. We will order a home sleep study to check for sleep apnea. If sleep apnea is confirmed, we may consider using a CPAP machine to help you breathe better at night.  -HEART FAILURE WITH REDUCED EJECTION FRACTION: Heart failure means your heart doesn't pump blood as well as it should. Your heart function has improved, but we will continue your current heart failure medications and monitor for any cough that might be caused by these medications.  INSTRUCTIONS: Please schedule a home sleep study to evaluate for obstructive sleep apnea. Continue with your current medications for asthma, acid reflux, and heart failure. We will also order blood tests to evaluate the potential for asthma-specific injections. If you experience any flare-ups, use steroids and antibiotics as needed. Follow up with your primary care physician as scheduled.

## 2023-12-13 LAB — IGE: IgE (Immunoglobulin E), Serum: 21 kU/L (ref <=114)

## 2023-12-20 ENCOUNTER — Ambulatory Visit: Payer: Self-pay | Admitting: Pulmonary Disease

## 2023-12-21 ENCOUNTER — Other Ambulatory Visit: Payer: Self-pay | Admitting: Family Medicine

## 2023-12-21 DIAGNOSIS — B001 Herpesviral vesicular dermatitis: Secondary | ICD-10-CM

## 2023-12-26 ENCOUNTER — Encounter

## 2023-12-30 ENCOUNTER — Other Ambulatory Visit: Payer: Self-pay | Admitting: Family Medicine

## 2023-12-30 DIAGNOSIS — E119 Type 2 diabetes mellitus without complications: Secondary | ICD-10-CM

## 2024-01-07 DIAGNOSIS — G4733 Obstructive sleep apnea (adult) (pediatric): Secondary | ICD-10-CM | POA: Diagnosis not present

## 2024-01-09 ENCOUNTER — Other Ambulatory Visit: Payer: Self-pay | Admitting: Family Medicine

## 2024-01-09 DIAGNOSIS — E034 Atrophy of thyroid (acquired): Secondary | ICD-10-CM

## 2024-01-15 NOTE — Telephone Encounter (Addendum)
-----   Message from Praveen Mannam sent at 01/15/2024  9:02 AM EDT ----- Please let patient know that sleep test shows moderate sleep apnea with drop in oxygen at night If she is agreeable please order AutoSet CPAP 5-15 cm of water  ----- Message ----- From: Delma Longs Sent: 01/10/2024   2:41 PM EDT To: Praveen Mannam, MD Called patient regarding Home sleep test result. Patient did not answer, left a message to call back.

## 2024-01-16 ENCOUNTER — Other Ambulatory Visit (HOSPITAL_COMMUNITY): Payer: Self-pay | Admitting: Cardiology

## 2024-01-16 MED ORDER — DAPAGLIFLOZIN PROPANEDIOL 10 MG PO TABS: 10.0000 mg | ORAL_TABLET | Freq: Every day | ORAL | 11 refills | Status: AC

## 2024-01-16 MED ORDER — SACUBITRIL-VALSARTAN 97-103 MG PO TABS: 1.0000 | ORAL_TABLET | Freq: Two times a day (BID) | ORAL | 11 refills | Status: AC

## 2024-01-16 NOTE — Telephone Encounter (Signed)
 Pt left VM that med refills are needed and/or PA  Meds refilled Message to [facility] to assist with PA

## 2024-01-17 ENCOUNTER — Telehealth (HOSPITAL_COMMUNITY): Payer: Self-pay

## 2024-01-17 ENCOUNTER — Other Ambulatory Visit: Payer: Self-pay

## 2024-01-17 ENCOUNTER — Other Ambulatory Visit (HOSPITAL_COMMUNITY): Payer: Self-pay

## 2024-01-17 ENCOUNTER — Encounter: Payer: Self-pay | Admitting: Family

## 2024-01-17 DIAGNOSIS — G4733 Obstructive sleep apnea (adult) (pediatric): Secondary | ICD-10-CM

## 2024-01-17 NOTE — Telephone Encounter (Signed)
 Advanced Heart Failure Patient Advocate Encounter  Prior authorization for Sacubitril -Valsartan  has been submitted and approved. Test billing returns $15 for 90 day supply.  Key: [id number] Effective: 01/17/2024 to 01/16/2025  Rachel DEL, CPhT Rx Patient Advocate Phone: [phone]

## 2024-01-22 ENCOUNTER — Other Ambulatory Visit: Payer: Self-pay | Admitting: Family Medicine

## 2024-01-22 DIAGNOSIS — F430 Acute stress reaction: Secondary | ICD-10-CM

## 2024-01-22 DIAGNOSIS — E782 Mixed hyperlipidemia: Secondary | ICD-10-CM

## 2024-01-31 ENCOUNTER — Encounter: Payer: Self-pay | Admitting: Pulmonary Disease

## 2024-02-04 ENCOUNTER — Other Ambulatory Visit (HOSPITAL_BASED_OUTPATIENT_CLINIC_OR_DEPARTMENT_OTHER)

## 2024-02-04 ENCOUNTER — Encounter (HOSPITAL_BASED_OUTPATIENT_CLINIC_OR_DEPARTMENT_OTHER): Payer: Self-pay

## 2024-02-05 ENCOUNTER — Ambulatory Visit (HOSPITAL_COMMUNITY)
Admission: RE | Admit: 2024-02-05 | Discharge: 2024-02-05 | Disposition: A | Discharge status: Home / Self Care | Source: Ambulatory Visit | Attending: Cardiology | Admitting: Cardiology

## 2024-02-05 DIAGNOSIS — I5022 Chronic systolic (congestive) heart failure: Secondary | ICD-10-CM | POA: Diagnosis not present

## 2024-02-05 DIAGNOSIS — Z859 Personal history of malignant neoplasm, unspecified: Secondary | ICD-10-CM | POA: Insufficient documentation

## 2024-02-05 DIAGNOSIS — I110 Hypertensive heart disease with heart failure: Secondary | ICD-10-CM | POA: Insufficient documentation

## 2024-02-05 LAB — BASIC METABOLIC PANEL WITH GFR
Anion gap: 9 (ref 5–15)
BUN: 10 mg/dL (ref 6–20)
CO2: 22 mmol/L (ref 22–32)
Calcium: 9.1 mg/dL (ref 8.9–10.3)
Chloride: 104 mmol/L (ref 98–111)
Creatinine, Ser: 0.71 mg/dL (ref 0.44–1.00)
GFR, Estimated: >60 mL/min (ref >60)
Glucose, Bld: 125 mg/dL — ABNORMAL HIGH (ref 70–99)
Potassium: 4.2 mmol/L (ref 3.5–5.1)
Sodium: 135 mmol/L (ref 135–145)

## 2024-02-05 LAB — ECHOCARDIOGRAM COMPLETE
Area-P 1/2: 3.65 cm2
Calc EF: 52.2 %
S' Lateral: 3.81 cm
Single Plane A2C EF: 53.1 %
Single Plane A4C EF: 50.6 %

## 2024-02-05 NOTE — Progress Notes (Signed)
  Echocardiogram 2D Echocardiogram has been performed.  Erin Fields 02/05/2024, 1:56 PM

## 2024-02-14 ENCOUNTER — Encounter: Payer: Self-pay | Admitting: Cardiology

## 2024-02-14 ENCOUNTER — Ambulatory Visit: Attending: Cardiology | Admitting: Cardiology

## 2024-02-14 VITALS — BP 94/64 | HR 80 | Ht 63.0 in | Wt 171.0 lb

## 2024-02-14 DIAGNOSIS — E119 Type 2 diabetes mellitus without complications: Secondary | ICD-10-CM | POA: Diagnosis not present

## 2024-02-14 DIAGNOSIS — I420 Dilated cardiomyopathy: Secondary | ICD-10-CM

## 2024-02-14 DIAGNOSIS — I5022 Chronic systolic (congestive) heart failure: Secondary | ICD-10-CM

## 2024-02-14 DIAGNOSIS — R7303 Prediabetes: Secondary | ICD-10-CM

## 2024-02-14 NOTE — Progress Notes (Signed)
 Cardiology Office Note:    Date:  02/14/2024   ID:  Erin Fields, DOB 08-06-1965, MRN [id number]  PCP:  Watt Harlene BROCKS, MD  Cardiologist:  Lamar Fitch, MD    Referring MD: Watt Harlene BROCKS, MD   Chief Complaint  Patient presents with   Follow-up    History of Present Illness:    Erin Fields is a 58 y.o. female   with complex past medical history.  She was initially referred to us  because of EKG being abnormal that was done before elective hip replacement surgery after that echocardiogram has been performed and surprising she was found to have 25% ejection fraction which is severely diminished, that was followed by cardiac catheterization which showed nonobstructive disease she was put on appropriate medication in spite of that her ejection fraction was still diminished, she was referred to our EP team for consideration for ICD.  ICD was implanted and everything was fine she left home however while at home she started having chest pain.  She ended going to the emergency room thinking that she may have discharged from the defibrillator, device has been interrogated no discharge has being identified she was discharged home, however presented back within the next few hours with shortness of breath.  She suffered from perforation of the right ventricle with injury of the artery leading to blood collection in the pleura.  She required open heart surgery with repair of the perforation of the right ventricle as well as evacuation of hematoma from the left pleura.   Comes today to months for follow-up overall she seems to be doing well no issues breathing she is being taken care of by pulmonary different medication has been tried for bronchial asthma.  She says she can do some but getting short of breath quite easily, recent echocardiogram show improvement left ventricle ejection fraction to 50 to 55%.  No swelling of lower extremities no palpitations dizziness passing out  Past  Medical History:  Diagnosis Date   Allergic rhinitis due to animal (cat) (dog) hair and dander 04/27/2020   Allergic rhinitis due to pollen 04/27/2020   Anxiety    Anxiety and depression 01/20/2018   Arthritis    Asthma    Asthma, persistent controlled 10/05/2018   B12 deficiency 11/27/2019   Borderline diabetes 12/17/2015   Breast cancer (HCC)    Cancer (HCC) 2019   right breast cancer   CERVICAL CANCER, HX OF 12/06/2006   Qualifier: Diagnosis of  By: Mavis MD, John E    Chronic sinusitis 01/26/2015   Chronic venous insufficiency 09/02/2015   Colon polyps    Congestive heart failure (HCC) 04/29/2020   Controlled type 2 diabetes mellitus without complication, without long-term current use of insulin  (HCC) 05/28/2017   Coronary artery disease 25% of RCA, 20% intermediate branch, 40% diagonal branch based on cardiac cath from 2022 07/22/2020   Crohn disease (HCC) 11/29/2016   Depression    Diabetes mellitus without complication (HCC)    type 2   Dilated cardiomyopathy (HCC) ejection fraction 25% by echocardiogram from January 2022 04/29/2020   Dyspnea 09/15/2014   Elevated triglycerides with high cholesterol 11/26/2013   Environmental allergies    Eustachian tube dysfunction 12/04/2014   Ex-smoker 11/18/2015   Family history of breast cancer    Genetic testing 07/16/2017   Negative genetic testing on the multi-cancer panel.  The Multi-Gene Panel offered by [name] includes sequencing and/or deletion duplication testing of the following 83 genes: ALK, APC, ATM, AXIN2,BAP1,  BARD1, BLM, BMPR1A, BRCA1, BRCA2, BRIP1, CASR, CDC73, CDH1, CDK4, CDKN1B, CDKN1C, CDKN2A (p14ARF), CDKN2A (p16INK4a), CEBPA, CHEK2, CTNNA1, DICER1, DIS3L2, EGFR (c.2369C>T, p.Thr790Met variant onl   GERD (gastroesophageal reflux disease)    HEADACHE 12/06/2006   Qualifier: Diagnosis of  By: Mavis MD, John E    Hemothorax on left 10/02/2020   History of adenomatous polyp of colon 11/19/2017   Hyperlipidemia     Hypertension    Hypothyroidism    IDA (iron  deficiency anemia) 11/06/2019   Impaired fasting glucose 12/12/2017   Insomnia 08/20/2013   Irritable bowel syndrome 11/29/2006   Qualifier: Diagnosis of  By: Mavis MD, John E    Left sided abdominal pain 12/13/2017   Liver hemangioma 08/24/2014   Malignant neoplasm of upper-outer quadrant of right breast in female, 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   Migraines    Mild persistent asthma, uncomplicated 04/27/2020   Nasal polyp, unspecified 01/10/2018   Oral herpes 04/20/2013   Other allergic rhinitis 09/15/2014   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 10/02/2020   Personal history of radiation therapy 2019   36 radiation tx   PONV (postoperative nausea and vomiting)    scopolamine  patch helps   Recurrent infections 10/05/2018   Right upper lobe pulmonary nodule 01/10/2018   Symptomatic varicose veins, right 09/02/2015   Ulcerative colitis (HCC)    Varicose veins     Past Surgical History:  Procedure Laterality Date   BREAST BIOPSY Right 02/2017   BREAST BIOPSY Right 11/07/2019   BREAST LUMPECTOMY Right 04/24/2017   BREAST LUMPECTOMY WITH RADIOACTIVE SEED AND SENTINEL LYMPH NODE BIOPSY Right 04/24/2017   Procedure: RIGHT BREAST LUMPECTOMY WITH RADIOACTIVE SEED AND SENTINEL LYMPH NODE BIOPSY;  Surgeon: Aron Shoulders, MD;  Location: [facility];  Service: General;  Laterality: Right;   CORONARY ARTERY BYPASS GRAFT     crapal tunnel relase Left 05/2018   ENDOMETRIAL ABLATION  2010   Had abnormal uterine bleeding (heavy and frequent), performed in [place], menses stopped completely 2 years after   ETHMOIDECTOMY Right 02/16/2023   Procedure: RIGHT TOTAL ETHMOIDECTOMY;  Surgeon: Karis Clunes, MD;  Location: [facility] OR;  Service: ENT;  Laterality: Right;   EXPLORATORY LAPAROTOMY     Had exploratory surgery at the age of 47 for abdominal pain with cyst found on her left ovary, not treated surgically   FOOT  SURGERY Right 2012   GENERATOR REMOVAL  10/02/2020   Procedure: GENERATOR REMOVAL;  Surgeon: Dusty Sudie DEL, MD;  Location: [facility] OR;  Service: Thoracic;;   HAND SURGERY Right    ICD IMPLANT N/A 10/01/2020   Procedure: ICD IMPLANT;  Surgeon: Inocencio Soyla Lunger, MD;  Location: [facility];  Service: Cardiovascular;  Laterality: N/A;   ICD LEAD REMOVAL N/A 10/02/2020   Procedure: ICD LEAD REMOVAL;  Surgeon: Dusty Sudie DEL, MD;  Location: [facility] OR;  Service: Thoracic;  Laterality: N/A;   MAXILLARY ANTROSTOMY Right 02/16/2023   Procedure: RIGHT MAXILLARY ANTROSTOMY WITH TISSUE REMOVAL;  Surgeon: Karis Clunes, MD;  Location: [facility] OR;  Service: ENT;  Laterality: Right;   MEDIASTERNOTOMY N/A 10/02/2020   Procedure: MEDIAN STERNOTOMY REPAIR PERFORATED VENTRICLE EVACUATION LEFT HEMOTHORAX;  Surgeon: Dusty Sudie DEL, MD;  Location: [facility] OR;  Service: Thoracic;  Laterality: N/A;   RE-EXCISION OF BREAST LUMPECTOMY Right 05/15/2017   Procedure: RE-EXCISION OF BREAST LUMPECTOMY;  Surgeon: Aron Shoulders, MD;  Location: [facility];  Service: General;  Laterality: Right;   RIGHT/LEFT HEART CATH AND CORONARY ANGIOGRAPHY  N/A 06/21/2020   Procedure: RIGHT/LEFT HEART CATH AND CORONARY ANGIOGRAPHY;  Surgeon: Mady Bruckner, MD;  Location: [facility];  Service: Cardiovascular;  Laterality: N/A;   ROBOTIC ASSISTED SALPINGO OOPHERECTOMY Bilateral 08/29/2019   Procedure: XI ROBOTIC ASSISTED SALPINGO OOPHORECTOMY;  Surgeon: Viktoria Comer SAUNDERS, MD;  Location: [facility];  Service: Gynecology;  Laterality: Bilateral;   Septoplasty with turbinate reduction     SINUS ENDO WITH FUSION Right 02/16/2023   Procedure: SINUS ENDO WITH FUSION;  Surgeon: Karis Clunes, MD;  Location: [facility] OR;  Service: ENT;  Laterality: Right;   SPHENOIDECTOMY Right 02/16/2023   Procedure: RIGHT SPHENOIDECTOMY WITH TISSUE REMOVAL;  Surgeon: Karis Clunes, MD;  Location: [facility] OR;  Service: ENT;  Laterality: Right;   TEE  WITHOUT CARDIOVERSION N/A 10/02/2020   Procedure: TRANSESOPHAGEAL ECHOCARDIOGRAM (TEE);  Surgeon: Dusty Sudie DEL, MD;  Location: [facility] OR;  Service: Thoracic;  Laterality: N/A;   TONSILLECTOMY  1980   TOTAL HIP ARTHROPLASTY Right 12/20/2021   Procedure: RIGHT TOTAL HIP ARTHROPLASTY ANTERIOR APPROACH;  Surgeon: Beverley Evalene BIRCH, MD;  Location: [facility] OR;  Service: Orthopedics;  Laterality: Right;   TOTAL HIP ARTHROPLASTY Left 05/23/2022   Procedure: TOTAL HIP ARTHROPLASTY ANTERIOR APPROACH;  Surgeon: Beverley Evalene BIRCH, MD;  Location: [facility] OR;  Service: Orthopedics;  Laterality: Left;   VEIN SURGERY     Removal Right Leg   WISDOM TOOTH EXTRACTION      Current Medications: Current Meds  Medication Sig   acetaminophen  (TYLENOL ) 500 MG tablet Take 2 tablets (1,000 mg total) by mouth every 6 (six) hours as needed for moderate pain or mild pain.   acyclovir  (ZOVIRAX ) 400 MG tablet Take 1 tablet (400 mg total) by mouth 3 (three) times daily as needed.   Adalimumab  80 MG/0.8ML PNKT Inject 80 mg into the skin every 14 (fourteen) days.   albuterol  (VENTOLIN  HFA) 108 (90 Base) MCG/ACT inhaler Inhale 2 puffs into the lungs every 6 (six) hours as needed for shortness of breath.   Azelastine  HCl 0.15 % SOLN Place 2 sprays into both nostrils 2 (two) times daily.   Blood Glucose Monitoring Suppl DEVI 1 each by Does not apply route in the morning, at noon, and at bedtime. May substitute to any manufacturer covered by patient's insurance.   Calcium  Carb-Cholecalciferol (CALCIUM  600 + D PO) Take 1 tablet by mouth daily.   carvedilol  (COREG ) 12.5 MG tablet TAKE 1.5 TABLETS (18.75 MG TOTAL) BY MOUTH 2 (TWO) TIMES DAILY.   cetirizine  (ZYRTEC ) 10 MG tablet Take 10 mg by mouth 2 (two) times daily.   dapagliflozin  propanediol (FARXIGA ) 10 MG TABS tablet Take 1 tablet (10 mg total) by mouth daily before breakfast.   EPINEPHrine  0.3 mg/0.3 mL IJ SOAJ injection Inject 0.3 mg into the muscle as needed for anaphylaxis.    famotidine  (PEPCID ) 20 MG tablet Take 20 mg by mouth 2 (two) times daily.   glucose blood (ACCU-CHEK GUIDE TEST) test strip USE AS DIRECTED IN THE MORNING, AT NOON, AND AT BEDTIME.   Hyoscyamine  Sulfate 0.375 MG TBCR Take 0.375 mg by mouth 2 (two) times daily as needed (stomach cramping).   icosapent  Ethyl (VASCEPA ) 1 g capsule TAKE 2 CAPSULES BY MOUTH 2 TIMES DAILY.   levothyroxine  (SYNTHROID ) 75 MCG tablet TAKE 1 TABLET BY MOUTH DAILY BEFORE BREAKFAST.   loperamide (IMODIUM A-D) 2 MG tablet Take 2 mg by mouth 4 (four) times daily as needed for diarrhea or loose stools.   MAGNESIUM  PO Take 250 mg  by mouth daily.   mesalamine  (LIALDA ) 1.2 g EC tablet Take 2.4 g by mouth in the morning and at bedtime.    metFORMIN  (GLUCOPHAGE ) 500 MG tablet Take 1 tablet (500 mg total) by mouth 2 (two) times daily with a meal.   montelukast  (SINGULAIR ) 10 MG tablet Take 10 mg by mouth at bedtime.    Multiple Vitamin (MULTIVITAMIN WITH MINERALS) TABS tablet Take 1 tablet by mouth daily.   pantoprazole  (PROTONIX ) 40 MG tablet TAKE 1 TABLET BY MOUTH EVERY DAY   promethazine  (PHENERGAN ) 12.5 MG tablet Take 1 tablet (12.5 mg total) by mouth every 6 (six) hours as needed for nausea or vomiting.   rosuvastatin  (CRESTOR ) 20 MG tablet TAKE 1 TABLET BY MOUTH EVERY DAY   sacubitril -valsartan  (ENTRESTO ) 97-103 MG Take 1 tablet by mouth 2 (two) times daily.   spironolactone  (ALDACTONE ) 25 MG tablet TAKE 1 TABLET (25 MG TOTAL) BY MOUTH DAILY.   SYMBICORT  160-4.5 MCG/ACT inhaler INHALE 2 PUFFS INTO THE LUNGS TWICE A DAY   triamcinolone  (NASACORT ) 55 MCG/ACT AERO nasal inhaler Place 2 sprays into the nose daily.   venlafaxine  XR (EFFEXOR -XR) 150 MG 24 hr capsule TAKE 1 CAPSULE BY MOUTH DAILY WITH BREAKFAST.   zinc  gluconate 50 MG tablet Take 50 mg by mouth daily.   zolpidem  (AMBIEN ) 5 MG tablet TAKE 1 TABLET BY MOUTH EVERY DAY AT BEDTIME AS NEEDED FOR SLEEP STRENGTH: 5 MG     Allergies:   Losartan potassium, Ace inhibitors,  Aspirin , Ceclor [cefaclor], Sulfa antibiotics, and Lisinopril   Social History   Socioeconomic History   Marital status: Widowed    Spouse name: Not on file   Number of children: Not on file   Years of education: Not on file   Highest education level: Some college, no degree  Occupational History   Not on file  Tobacco Use   Smoking status: Former    Current packs/day: 0.00    Average packs/day: 3.0 packs/day for 15.0 years (45.0 ttl pk-yrs)    Types: Cigarettes    Start date: 09/15/1978    Quit date: 09/14/1993    Years since quitting: 30.4   Smokeless tobacco: Never   Tobacco comments:    Quit 26 yrs ago  Vaping Use   Vaping status: Never Used  Substance and Sexual Activity   Alcohol use: Yes    Alcohol/week: 1.0 - 2.0 standard drink of alcohol    Types: 1 - 2 Glasses of wine per week    Comment: occ   Drug use: No   Sexual activity: Not Currently    Birth control/protection: Post-menopausal    Comment: ablation  Other Topics Concern   Not on file  Social History Narrative   Not on file   Social Drivers of Health   Financial Resource Strain: Low Risk  (12/02/2023)   Overall Financial Resource Strain (CARDIA)    Difficulty of Paying Living Expenses: Not hard at all  Food Insecurity: No Food Insecurity (12/02/2023)   Hunger Vital Sign    Worried About Running Out of Food in the Last Year: Never true    Ran Out of Food in the Last Year: Never true  Transportation Needs: No Transportation Needs (12/02/2023)   PRAPARE - Administrator, Civil Service (Medical): No    Lack of Transportation (Non-Medical): No  Physical Activity: Insufficiently Active (12/02/2023)   Exercise Vital Sign    Days of Exercise per Week: 2 days    Minutes of Exercise  per Session: 20 min  Stress: No Stress Concern Present (12/02/2023)   [employer] of Occupational Health - Occupational Stress Questionnaire    Feeling of Stress: Only a little  Social Connections: Socially  Isolated (12/02/2023)   Social Connection and Isolation Panel    Frequency of Communication with Friends and Family: More than three times a week    Frequency of Social Gatherings with Friends and Family: Once a week    Attends Religious Services: Never    Database Administrator or Organizations: No    Attends Engineer, Structural: Not on file    Marital Status: Widowed     Family History: The patient's family history includes Arthritis (age of onset: 96) in her mother; Brain cancer in her father; Breast cancer in her mother; Colitis in her sister; Crohn's disease in her mother; Diabetes in her maternal grandfather and mother; Heart attack in her maternal grandfather; Heart disease in her maternal grandfather and mother; Hyperlipidemia in her mother; Hypertension in her mother; Leukemia in her maternal grandfather; Leukemia (age of onset: 3) in her cousin; Lung cancer in her father and mother. ROS:   Please see the history of present illness.    All 14 point review of systems negative except as described per history of present illness  EKGs/Labs/Other Studies Reviewed:         Recent Labs: 06/04/2023: B Natriuretic Peptide 12.7 07/26/2023: ALT 17 09/24/2023: TSH 0.771 12/12/2023: Hemoglobin 13.7; Platelets 351.0 02/05/2024: BUN 10; Creatinine, Ser 0.71; Potassium 4.2; Sodium 135  Recent Lipid Panel    Component Value Date/Time   CHOL 119 11/05/2023 1038   CHOL 128 08/10/2020 0936   TRIG 160 (H) 11/05/2023 1038   HDL 40 (L) 11/05/2023 1038   HDL 37 (L) 08/10/2020 0936   CHOLHDL 3.0 11/05/2023 1038   VLDL 32 11/05/2023 1038   LDLCALC 47 11/05/2023 1038   LDLCALC 65 08/10/2020 0936   LDLDIRECT 66.0 09/14/2021 0955    Physical Exam:    VS:  BP 94/64   Pulse 80   Ht 5' 3 (1.6 m)   Wt 171 lb (77.6 kg)   SpO2 94%   BMI 30.29 kg/m     Wt Readings from Last 3 Encounters:  02/14/24 171 lb (77.6 kg)  12/12/23 170 lb (77.1 kg)  12/03/23 166 lb 9.6 oz (75.6 kg)      GEN:  Well nourished, well developed in no acute distress HEENT: Normal NECK: No JVD; No carotid bruits LYMPHATICS: No lymphadenopathy CARDIAC: RRR, no murmurs, no rubs, no gallops RESPIRATORY:  Clear to auscultation without rales, wheezing or rhonchi  ABDOMEN: Soft, non-tender, non-distended MUSCULOSKELETAL:  No edema; No deformity  SKIN: Warm and dry LOWER EXTREMITIES: no swelling NEUROLOGIC:  Alert and oriented x 3 PSYCHIATRIC:  Normal affect   ASSESSMENT:    1. Dilated cardiomyopathy (HCC) ejection fraction 25% by echocardiogram from January 2022   2. Chronic systolic congestive heart failure (HCC)   3. Controlled type 2 diabetes mellitus without complication, without long-term current use of insulin  (HCC)   4. Borderline diabetes    PLAN:    In order of problems listed above:  Cardiomyopathy with significant improvement will continue guideline directed medical therapy, I offer her a reduction of carvedilol  to see if bronchospasm will improve but she does not want to do that she is scared that her heart will get weak.  I told her that she need to have investigation done by pulmonary if they still  cannot identify and improve her symptomatology then we come back to potential cardiac issues.  But overall ejection fraction improved which make me very happy. Ulcerative colitis that being followed by her primary care physicians, Dyslipidemia I did review fasting lipid profile done in November 05, 2023 LDL 47 HDL 40 continue present management. Chronic lung condition followed by pulmonary   Medication Adjustments/Labs and Tests Ordered: Current medicines are reviewed at length with the patient today.  Concerns regarding medicines are outlined above.  No orders of the defined types were placed in this encounter.  Medication changes: No orders of the defined types were placed in this encounter.   Signed, Lamar DOROTHA Fitch, MD, [facility] 02/14/2024 2:25 PM    [facility]  [facility]

## 2024-02-14 NOTE — Patient Instructions (Signed)

## 2024-02-21 ENCOUNTER — Encounter: Payer: Self-pay | Admitting: Hematology

## 2024-02-21 ENCOUNTER — Ambulatory Visit (HOSPITAL_BASED_OUTPATIENT_CLINIC_OR_DEPARTMENT_OTHER)
Admission: RE | Admit: 2024-02-21 | Discharge: 2024-02-21 | Disposition: A | Discharge status: Home / Self Care | Source: Ambulatory Visit | Attending: Hematology | Admitting: Hematology

## 2024-02-21 DIAGNOSIS — E2839 Other primary ovarian failure: Secondary | ICD-10-CM | POA: Diagnosis present

## 2024-02-27 ENCOUNTER — Other Ambulatory Visit: Payer: Self-pay | Admitting: Family Medicine

## 2024-03-01 NOTE — Progress Notes (Unsigned)
 [facility] [address], [address] [place], [place] [address] [phone]  Date:  03/03/2024   Name:  Erline Siddoway   DOB:  May 21, 1965   MRN:  [id number]  PCP:  Watt Harlene BROCKS, MD    Chief Complaint: No chief complaint on file.   History of Present Illness:  Weslee Prestage is a 58 y.o. very pleasant female patient who presents with the following:  Patient here today with concern of illness Most recent visit with me was in August She has history of chronic sinus disease and gets regular sinusitis infections -history significant for right-sided breast cancer, type 2 diabetes, ulcerative colitis on biologic, nonobstructive coronary artery disease, hypertension, hyperlipidemia, previous history of cardiomyopathy and ICD placement with right ventricular perforation, bilateral hip replacement  Followed by oncology and advanced CHF clinic  She also uses nocturnal oxygen since with an overnight oximeter in March  Discussed the use of AI scribe software for clinical note transcription with the patient, who gave verbal consent to proceed.  History of Present Illness    Patient Active Problem List   Diagnosis Date Noted   Microalbuminuria due to type 2 diabetes mellitus (HCC) 12/03/2023   Chronic ethmoidal sinusitis 02/23/2023   Chronic sphenoidal sinusitis 02/23/2023   Polyp of nasal sinus 02/16/2023   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   DJD (degenerative joint disease) 12/20/2021   S/P total right hip arthroplasty 12/20/2021   Diabetes mellitus with insulin  therapy (HCC) 10/24/2021   Lymphedema of breast 10/24/2021   Breast cancer (HCC) 05/11/2021   Hemothorax on left 10/02/2020   S/P evacuation of hematoma 10/02/2020   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 10/02/2020   Hypovolemic shock (HCC) 10/02/2020   Anxiety    Coronary artery disease 25% of RCA, 20% intermediate branch, 40% diagonal  branch based on cardiac cath from 2022 07/22/2020   Dilated cardiomyopathy (HCC) ejection fraction 25% by echocardiogram from January 2022 04/29/2020   Congestive heart failure (HCC) 04/29/2020   Allergic rhinitis due to animal (cat) (dog) hair and dander 04/27/2020   Allergic rhinitis due to pollen 04/27/2020   Mild persistent asthma, uncomplicated 04/27/2020   PONV (postoperative nausea and vomiting)    Migraines    GERD (gastroesophageal reflux disease)    Environmental allergies    Diabetes mellitus without complication (HCC)    Depression    Colon polyps    B12 deficiency 11/27/2019   IDA (iron  deficiency anemia) 11/06/2019   Recurrent infections 10/05/2018   Anxiety and depression 01/20/2018   Right upper lobe pulmonary nodule 01/10/2018   Nasal polyp, unspecified 01/10/2018   Left sided abdominal pain 12/13/2017   Impaired fasting glucose 12/12/2017   History of adenomatous polyp of colon 11/19/2017   Genetic testing 07/16/2017   Family history of breast cancer    Controlled type 2 diabetes mellitus without complication, without long-term current use of insulin  (HCC) 05/28/2017   Personal history of radiation therapy 2019   Cancer (HCC) 2019   Malignant neoplasm of upper-outer quadrant of right breast in female, 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   Crohn disease (HCC) 11/29/2016   Borderline diabetes 12/17/2015   Ex-smoker 11/18/2015   Chronic venous insufficiency 09/02/2015   Symptomatic varicose veins, right 09/02/2015   Chronic maxillary sinusitis 01/26/2015   Eustachian tube dysfunction 12/04/2014   Other allergic rhinitis 09/15/2014   Dyspnea 09/15/2014   Liver hemangioma 08/24/2014   Elevated triglycerides  with high cholesterol 11/26/2013   Insomnia 08/20/2013   Hypertension 08/20/2013   Ulcerative colitis (HCC) 04/20/2013   Oral herpes 04/20/2013   Hyperlipidemia 12/06/2006   HEADACHE 12/06/2006   CERVICAL CANCER, HX OF 12/06/2006   Hypothyroidism  11/29/2006   Asthma 11/29/2006   IRRITABLE BOWEL SYNDROME 11/29/2006    Past Medical History:  Diagnosis Date   Allergic rhinitis due to animal (cat) (dog) hair and dander 04/27/2020   Allergic rhinitis due to pollen 04/27/2020   Anxiety    Anxiety and depression 01/20/2018   Arthritis    Asthma    Asthma, persistent controlled 10/05/2018   B12 deficiency 11/27/2019   Borderline diabetes 12/17/2015   Breast cancer (HCC)    Cancer (HCC) 2019   right breast cancer   CERVICAL CANCER, HX OF 12/06/2006   Qualifier: Diagnosis of  By: Mavis MD, John E    Chronic sinusitis 01/26/2015   Chronic venous insufficiency 09/02/2015   Colon polyps    Congestive heart failure (HCC) 04/29/2020   Controlled type 2 diabetes mellitus without complication, without long-term current use of insulin  (HCC) 05/28/2017   Coronary artery disease 25% of RCA, 20% intermediate branch, 40% diagonal branch based on cardiac cath from 2022 07/22/2020   Crohn disease (HCC) 11/29/2016   Depression    Diabetes mellitus without complication (HCC)    type 2   Dilated cardiomyopathy (HCC) ejection fraction 25% by echocardiogram from January 2022 04/29/2020   Dyspnea 09/15/2014   Elevated triglycerides with high cholesterol 11/26/2013   Environmental allergies    Eustachian tube dysfunction 12/04/2014   Ex-smoker 11/18/2015   Family history of breast cancer    Genetic testing 07/16/2017   Negative genetic testing on the multi-cancer panel.  The Multi-Gene Panel offered by [name] includes sequencing and/or deletion duplication testing of the following 83 genes: ALK, APC, ATM, AXIN2,BAP1,  BARD1, BLM, BMPR1A, BRCA1, BRCA2, BRIP1, CASR, CDC73, CDH1, CDK4, CDKN1B, CDKN1C, CDKN2A (p14ARF), CDKN2A (p16INK4a), CEBPA, CHEK2, CTNNA1, DICER1, DIS3L2, EGFR (c.2369C>T, p.Thr790Met variant onl   GERD (gastroesophageal reflux disease)    HEADACHE 12/06/2006   Qualifier: Diagnosis of  By: Mavis MD, John E    Hemothorax on  left 10/02/2020   History of adenomatous polyp of colon 11/19/2017   Hyperlipidemia    Hypertension    Hypothyroidism    IDA (iron  deficiency anemia) 11/06/2019   Impaired fasting glucose 12/12/2017   Insomnia 08/20/2013   Irritable bowel syndrome 11/29/2006   Qualifier: Diagnosis of  By: Mavis MD, John E    Left sided abdominal pain 12/13/2017   Liver hemangioma 08/24/2014   Malignant neoplasm of upper-outer quadrant of right breast in female, 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   Migraines    Mild persistent asthma, uncomplicated 04/27/2020   Nasal polyp, unspecified 01/10/2018   Oral herpes 04/20/2013   Other allergic rhinitis 09/15/2014   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 10/02/2020   Personal history of radiation therapy 2019   36 radiation tx   PONV (postoperative nausea and vomiting)    scopolamine  patch helps   Recurrent infections 10/05/2018   Right upper lobe pulmonary nodule 01/10/2018   Symptomatic varicose veins, right 09/02/2015   Ulcerative colitis (HCC)    Varicose veins     Past Surgical History:  Procedure Laterality Date   BREAST BIOPSY Right 02/2017   BREAST BIOPSY Right 11/07/2019   BREAST LUMPECTOMY Right 04/24/2017   BREAST LUMPECTOMY WITH RADIOACTIVE SEED AND SENTINEL LYMPH NODE BIOPSY Right 04/24/2017  Procedure: RIGHT BREAST LUMPECTOMY WITH RADIOACTIVE SEED AND SENTINEL LYMPH NODE BIOPSY;  Surgeon: Aron Shoulders, MD;  Location: [facility];  Service: General;  Laterality: Right;   CORONARY ARTERY BYPASS GRAFT     crapal tunnel relase Left 05/2018   ENDOMETRIAL ABLATION  2010   Had abnormal uterine bleeding (heavy and frequent), performed in [place], menses stopped completely 2 years after   ETHMOIDECTOMY Right 02/16/2023   Procedure: RIGHT TOTAL ETHMOIDECTOMY;  Surgeon: Karis Clunes, MD;  Location: [facility] OR;  Service: ENT;  Laterality: Right;   EXPLORATORY LAPAROTOMY     Had exploratory surgery at the age of 83  for abdominal pain with cyst found on her left ovary, not treated surgically   FOOT SURGERY Right 2012   GENERATOR REMOVAL  10/02/2020   Procedure: GENERATOR REMOVAL;  Surgeon: Dusty Sudie DEL, MD;  Location: [facility] OR;  Service: Thoracic;;   HAND SURGERY Right    ICD IMPLANT N/A 10/01/2020   Procedure: ICD IMPLANT;  Surgeon: Inocencio Soyla Lunger, MD;  Location: [facility];  Service: Cardiovascular;  Laterality: N/A;   ICD LEAD REMOVAL N/A 10/02/2020   Procedure: ICD LEAD REMOVAL;  Surgeon: Dusty Sudie DEL, MD;  Location: [facility] OR;  Service: Thoracic;  Laterality: N/A;   MAXILLARY ANTROSTOMY Right 02/16/2023   Procedure: RIGHT MAXILLARY ANTROSTOMY WITH TISSUE REMOVAL;  Surgeon: Karis Clunes, MD;  Location: [facility] OR;  Service: ENT;  Laterality: Right;   MEDIASTERNOTOMY N/A 10/02/2020   Procedure: MEDIAN STERNOTOMY REPAIR PERFORATED VENTRICLE EVACUATION LEFT HEMOTHORAX;  Surgeon: Dusty Sudie DEL, MD;  Location: [facility] OR;  Service: Thoracic;  Laterality: N/A;   RE-EXCISION OF BREAST LUMPECTOMY Right 05/15/2017   Procedure: RE-EXCISION OF BREAST LUMPECTOMY;  Surgeon: Aron Shoulders, MD;  Location: [facility];  Service: General;  Laterality: Right;   RIGHT/LEFT HEART CATH AND CORONARY ANGIOGRAPHY N/A 06/21/2020   Procedure: RIGHT/LEFT HEART CATH AND CORONARY ANGIOGRAPHY;  Surgeon: Mady Bruckner, MD;  Location: [facility];  Service: Cardiovascular;  Laterality: N/A;   ROBOTIC ASSISTED SALPINGO OOPHERECTOMY Bilateral 08/29/2019   Procedure: XI ROBOTIC ASSISTED SALPINGO OOPHORECTOMY;  Surgeon: Viktoria Comer SAUNDERS, MD;  Location: [facility];  Service: Gynecology;  Laterality: Bilateral;   Septoplasty with turbinate reduction     SINUS ENDO WITH FUSION Right 02/16/2023   Procedure: SINUS ENDO WITH FUSION;  Surgeon: Karis Clunes, MD;  Location: [facility] OR;  Service: ENT;  Laterality: Right;   SPHENOIDECTOMY Right 02/16/2023   Procedure: RIGHT SPHENOIDECTOMY WITH TISSUE REMOVAL;   Surgeon: Karis Clunes, MD;  Location: [facility] OR;  Service: ENT;  Laterality: Right;   TEE WITHOUT CARDIOVERSION N/A 10/02/2020   Procedure: TRANSESOPHAGEAL ECHOCARDIOGRAM (TEE);  Surgeon: Dusty Sudie DEL, MD;  Location: [facility] OR;  Service: Thoracic;  Laterality: N/A;   TONSILLECTOMY  1980   TOTAL HIP ARTHROPLASTY Right 12/20/2021   Procedure: RIGHT TOTAL HIP ARTHROPLASTY ANTERIOR APPROACH;  Surgeon: Beverley Evalene BIRCH, MD;  Location: [facility] OR;  Service: Orthopedics;  Laterality: Right;   TOTAL HIP ARTHROPLASTY Left 05/23/2022   Procedure: TOTAL HIP ARTHROPLASTY ANTERIOR APPROACH;  Surgeon: Beverley Evalene BIRCH, MD;  Location: [facility] OR;  Service: Orthopedics;  Laterality: Left;   VEIN SURGERY     Removal Right Leg   WISDOM TOOTH EXTRACTION      Social History   Tobacco Use   Smoking status: Former    Current packs/day: 0.00    Average packs/day: 3.0 packs/day for 15.0 years (45.0 ttl pk-yrs)  Types: Cigarettes    Start date: 09/15/1978    Quit date: 09/14/1993    Years since quitting: 30.4   Smokeless tobacco: Never   Tobacco comments:    Quit 26 yrs ago  Vaping Use   Vaping status: Never Used  Substance Use Topics   Alcohol use: Yes    Alcohol/week: 1.0 - 2.0 standard drink of alcohol    Types: 1 - 2 Glasses of wine per week    Comment: occ   Drug use: No    Family History  Problem Relation Age of Onset   Arthritis Mother 52       Deceased   Breast cancer Mother    Lung cancer Mother    Hyperlipidemia Mother    Heart disease Mother    Hypertension Mother    Diabetes Mother    Crohn's disease Mother    Diabetes Maternal Grandfather    Heart disease Maternal Grandfather    Heart attack Maternal Grandfather    Leukemia Maternal Grandfather    Colitis Sister    Leukemia Cousin 3       mat cousin   Lung cancer Father    Brain cancer Father     Allergies  Allergen Reactions   Losartan Potassium Shortness Of Breath   Ace Inhibitors Cough   Aspirin      Uncoated Aspirin  upsets stomach    Ceclor [Cefaclor] Hives    Has tolerated Ancef  in 2022   Sulfa Antibiotics Hives and Other (See Comments)   Lisinopril Cough    Medication list has been reviewed and updated.  Current Outpatient Medications on File Prior to Visit  Medication Sig Dispense Refill   acetaminophen  (TYLENOL ) 500 MG tablet Take 2 tablets (1,000 mg total) by mouth every 6 (six) hours as needed for moderate pain or mild pain. 30 tablet 0   acyclovir  (ZOVIRAX ) 400 MG tablet Take 1 tablet (400 mg total) by mouth 3 (three) times daily as needed. 30 tablet 1   Adalimumab  80 MG/0.8ML PNKT Inject 80 mg into the skin every 14 (fourteen) days.     albuterol  (VENTOLIN  HFA) 108 (90 Base) MCG/ACT inhaler Inhale 2 puffs into the lungs every 6 (six) hours as needed for shortness of breath.     Azelastine  HCl 0.15 % SOLN Place 2 sprays into both nostrils 2 (two) times daily.     Blood Glucose Monitoring Suppl DEVI 1 each by Does not apply route in the morning, at noon, and at bedtime. May substitute to any manufacturer covered by patient's insurance. 1 each 0   Calcium  Carb-Cholecalciferol (CALCIUM  600 + D PO) Take 1 tablet by mouth daily.     carvedilol  (COREG ) 12.5 MG tablet TAKE 1.5 TABLETS (18.75 MG TOTAL) BY MOUTH 2 (TWO) TIMES DAILY. 270 tablet 3   cetirizine  (ZYRTEC ) 10 MG tablet Take 10 mg by mouth 2 (two) times daily.     dapagliflozin  propanediol (FARXIGA ) 10 MG TABS tablet Take 1 tablet (10 mg total) by mouth daily before breakfast. 30 tablet 11   EPINEPHrine  0.3 mg/0.3 mL IJ SOAJ injection Inject 0.3 mg into the muscle as needed for anaphylaxis.     famotidine  (PEPCID ) 20 MG tablet Take 20 mg by mouth 2 (two) times daily.     glucose blood (ACCU-CHEK GUIDE TEST) test strip USE AS DIRECTED IN THE MORNING, AT NOON, AND AT BEDTIME. 300 strip 1   Hyoscyamine  Sulfate 0.375 MG TBCR Take 0.375 mg by mouth 2 (two) times daily as needed (  stomach cramping).     icosapent  Ethyl (VASCEPA ) 1 g capsule TAKE 2 CAPSULES BY MOUTH 2  TIMES DAILY. 200 capsule 5   levothyroxine  (SYNTHROID ) 75 MCG tablet TAKE 1 TABLET BY MOUTH DAILY BEFORE BREAKFAST. 90 tablet 0   loperamide (IMODIUM A-D) 2 MG tablet Take 2 mg by mouth 4 (four) times daily as needed for diarrhea or loose stools.     MAGNESIUM  PO Take 250 mg by mouth daily.     mesalamine  (LIALDA ) 1.2 g EC tablet Take 2.4 g by mouth in the morning and at bedtime.      metFORMIN  (GLUCOPHAGE ) 500 MG tablet Take 1 tablet (500 mg total) by mouth 2 (two) times daily with a meal. 180 tablet 1   montelukast  (SINGULAIR ) 10 MG tablet Take 10 mg by mouth at bedtime.      Multiple Vitamin (MULTIVITAMIN WITH MINERALS) TABS tablet Take 1 tablet by mouth daily.     pantoprazole  (PROTONIX ) 40 MG tablet TAKE 1 TABLET BY MOUTH EVERY DAY 90 tablet 1   promethazine  (PHENERGAN ) 12.5 MG tablet Take 1 tablet (12.5 mg total) by mouth every 6 (six) hours as needed for nausea or vomiting. 30 tablet 3   rosuvastatin  (CRESTOR ) 20 MG tablet TAKE 1 TABLET BY MOUTH EVERY DAY 90 tablet 1   sacubitril -valsartan  (ENTRESTO ) 97-103 MG Take 1 tablet by mouth 2 (two) times daily. 60 tablet 11   spironolactone  (ALDACTONE ) 25 MG tablet TAKE 1 TABLET (25 MG TOTAL) BY MOUTH DAILY. 90 tablet 3   SYMBICORT  160-4.5 MCG/ACT inhaler INHALE 2 PUFFS INTO THE LUNGS TWICE A DAY 30.6 each 1   triamcinolone  (NASACORT ) 55 MCG/ACT AERO nasal inhaler Place 2 sprays into the nose daily.     venlafaxine  XR (EFFEXOR -XR) 150 MG 24 hr capsule TAKE 1 CAPSULE BY MOUTH DAILY WITH BREAKFAST. 90 capsule 1   zinc  gluconate 50 MG tablet Take 50 mg by mouth daily.     zolpidem  (AMBIEN ) 5 MG tablet TAKE 1 TABLET BY MOUTH EVERY DAY AT BEDTIME AS NEEDED FOR SLEEP STRENGTH: 5 MG 30 tablet 3   No current facility-administered medications on file prior to visit.    Review of Systems:  As per HPI- otherwise negative.   Physical Examination: There were no vitals filed for this visit. There were no vitals filed for this visit. There is no height  or weight on file to calculate BMI. Ideal Body Weight:    GEN: no acute distress. HEENT: Atraumatic, Normocephalic.  Ears and Nose: No external deformity. CV: RRR, No M/G/R. No JVD. No thrill. No extra heart sounds. PULM: CTA B, no wheezes, crackles, rhonchi. No retractions. No resp. distress. No accessory muscle use. ABD: S, NT, ND, +BS. No rebound. No HSM. EXTR: No c/c/e PSYCH: Normally interactive. Conversant.    Assessment and Plan: No diagnosis found.  Assessment & Plan   Signed Harlene Schroeder, MD

## 2024-03-03 ENCOUNTER — Ambulatory Visit: Admitting: Family Medicine

## 2024-03-03 VITALS — BP 114/72 | HR 82 | Temp 98.1°F | Ht 63.0 in | Wt 168.3 lb

## 2024-03-03 DIAGNOSIS — E119 Type 2 diabetes mellitus without complications: Secondary | ICD-10-CM

## 2024-03-03 DIAGNOSIS — J0111 Acute recurrent frontal sinusitis: Secondary | ICD-10-CM | POA: Diagnosis not present

## 2024-03-03 DIAGNOSIS — J45909 Unspecified asthma, uncomplicated: Secondary | ICD-10-CM

## 2024-03-03 DIAGNOSIS — R058 Other specified cough: Secondary | ICD-10-CM | POA: Diagnosis not present

## 2024-03-03 MED ORDER — PREDNISONE 20 MG PO TABS: ORAL_TABLET | ORAL | 0 refills | Status: DC

## 2024-03-03 MED ORDER — DOXYCYCLINE HYCLATE 100 MG PO CAPS: 100.0000 mg | ORAL_CAPSULE | Freq: Two times a day (BID) | ORAL | 0 refills | Status: DC

## 2024-03-04 ENCOUNTER — Encounter: Payer: Self-pay | Admitting: Family Medicine

## 2024-03-04 LAB — HEMOGLOBIN A1C: Hgb A1c MFr Bld: 7.1 % — ABNORMAL HIGH (ref 4.6–6.5)

## 2024-03-10 ENCOUNTER — Other Ambulatory Visit: Payer: Self-pay | Admitting: Family Medicine

## 2024-03-10 DIAGNOSIS — F5104 Psychophysiologic insomnia: Secondary | ICD-10-CM

## 2024-03-17 ENCOUNTER — Encounter

## 2024-03-17 ENCOUNTER — Ambulatory Visit: Admitting: Pulmonary Disease

## 2024-03-25 ENCOUNTER — Encounter (INDEPENDENT_AMBULATORY_CARE_PROVIDER_SITE_OTHER): Payer: Self-pay | Admitting: Otolaryngology

## 2024-03-25 ENCOUNTER — Ambulatory Visit (INDEPENDENT_AMBULATORY_CARE_PROVIDER_SITE_OTHER): Admitting: Otolaryngology

## 2024-03-25 NOTE — Progress Notes (Unsigned)
 Patient ID: Erin Fields, female   DOB: April 14, 1966, 58 y.o.   MRN: [id number]  Follow-up: Chronic right-sided fungal sinusitis  Procedure: Endoscopic right sided nasal/sinus debridement   Indication: The patient is a 58 year old female who returns today for her follow-up evaluation.  The patient previously underwent right endoscopic sinus surgery to treat her chronic right maxillary, ethmoid, and sphenoid sinusitis and polyposis on February 16, 2023.  Fungus balls were noted within the right sphenoid sinus.  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  The patient was treated with medicated (mupirocin/budesonide /amphotericin B) nasal rinse.  The patient returns today complaining of more recurrent right nasal crusting and congestion.  She was able to flush out large chunks of crusting and fungal debris from her right nasal cavity.  Currently the patient denies any facial pain, fever, or visual change.    Anesthesia: Topical Xylocaine  and Neo-Synephrine.   Description: The patient is placed upright in the exam chair.  The right nasal cavity is sprayed with topical Xylocaine  and Neo-Synephrine.  A 0 rigid endoscope is used for the examination. The scope is advanced past the right nostril into the right nasal cavity. A moderate amount of crusting and fungal debris are noted within the right nasal cavity, the maxillary/ethmoid cavities, and the sphenoid cavity.  The crusting is removed with suction catheters and alligator forceps, which are inserted in parallel with the rigid endoscope.  After the debridement procedure, the maxillary antrum and the ethmoid cavities are noted to be widely patent.  The sphenoid opening is also patent.  The patient tolerated the procedure well.    Follow up care: The patient is instructed to perform medicated (mupirocin/budesonide /amphotericin B) nasal rinse daily when symptomatic.  The patient will return for re-evaluation in 6 months, sooner if needed.

## 2024-04-01 NOTE — Progress Notes (Unsigned)
 [facility] [address] [place], [place] [address] [phone]  Date:  04/02/2024   Name:  Erin Fields   DOB:  1965-06-18   MRN:  [id number]  PCP:  Watt Harlene BROCKS, MD    Chief Complaint: No chief complaint on file.   History of Present Illness:  Erin Fields is a 58 y.o. very pleasant female patient who presents with the following:  Patient seen today with concern of cough.  I saw her most recently in November She has history of chronic sinus disease and gets regular sinusitis infections -history significant for right-sided breast cancer, type 2 diabetes, ulcerative colitis on biologic, nonobstructive coronary artery disease, hypertension, hyperlipidemia, previous history of cardiomyopathy and ICD placement with right ventricular perforation, bilateral hip replacement  Followed by oncology and advanced CHF clinic  She also uses nocturnal oxygen since   At her visit in November she noted likely recurrent sinusitis, I treated her with doxycycline  and prednisone  Due for foot exam Mammogram scheduled for tomorrow Lab Results  Component Value Date   HGBA1C 7.1 (H) 03/03/2024     Discussed the use of AI scribe software for clinical note transcription with the patient, who gave verbal consent to proceed.  History of Present Illness    Patient Active Problem List   Diagnosis Date Noted   Microalbuminuria due to type 2 diabetes mellitus (HCC) 12/03/2023   Chronic ethmoidal sinusitis 02/23/2023   Chronic sphenoidal sinusitis 02/23/2023   Polyp of nasal sinus 02/16/2023   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   DJD (degenerative joint disease) 12/20/2021   S/P total right hip arthroplasty 12/20/2021   Diabetes mellitus with insulin  therapy (HCC) 10/24/2021   Lymphedema of breast 10/24/2021   Breast cancer (HCC) 05/11/2021   Hemothorax on left 10/02/2020   S/P evacuation of hematoma  10/02/2020   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 10/02/2020   Hypovolemic shock (HCC) 10/02/2020   Anxiety    Coronary artery disease 25% of RCA, 20% intermediate branch, 40% diagonal branch based on cardiac cath from 2022 07/22/2020   Dilated cardiomyopathy (HCC) ejection fraction 25% by echocardiogram from January 2022 04/29/2020   Congestive heart failure (HCC) 04/29/2020   Allergic rhinitis due to animal (cat) (dog) hair and dander 04/27/2020   Allergic rhinitis due to pollen 04/27/2020   Mild persistent asthma, uncomplicated 04/27/2020   PONV (postoperative nausea and vomiting)    Migraines    GERD (gastroesophageal reflux disease)    Environmental allergies    Diabetes mellitus without complication (HCC)    Depression    Colon polyps    B12 deficiency 11/27/2019   IDA (iron  deficiency anemia) 11/06/2019   Recurrent infections 10/05/2018   Anxiety and depression 01/20/2018   Right upper lobe pulmonary nodule 01/10/2018   Nasal polyp, unspecified 01/10/2018   Left sided abdominal pain 12/13/2017   Impaired fasting glucose 12/12/2017   History of adenomatous polyp of colon 11/19/2017   Genetic testing 07/16/2017   Family history of breast cancer    Controlled type 2 diabetes mellitus without complication, without long-term current use of insulin  (HCC) 05/28/2017   Personal history of radiation therapy 2019   Cancer (HCC) 2019   Malignant neoplasm of upper-outer quadrant of right breast in female, 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   Crohn disease (HCC) 11/29/2016   Borderline diabetes 12/17/2015   Ex-smoker 11/18/2015   Chronic venous insufficiency 09/02/2015  Symptomatic varicose veins, right 09/02/2015   Chronic maxillary sinusitis 01/26/2015   Eustachian tube dysfunction 12/04/2014   Other allergic rhinitis 09/15/2014   Dyspnea 09/15/2014   Liver hemangioma 08/24/2014   Elevated triglycerides with high cholesterol 11/26/2013   Insomnia  08/20/2013   Hypertension 08/20/2013   Ulcerative colitis (HCC) 04/20/2013   Oral herpes 04/20/2013   Hyperlipidemia 12/06/2006   HEADACHE 12/06/2006   CERVICAL CANCER, HX OF 12/06/2006   Hypothyroidism 11/29/2006   Asthma 11/29/2006   IRRITABLE BOWEL SYNDROME 11/29/2006    Past Medical History:  Diagnosis Date   Allergic rhinitis due to animal (cat) (dog) hair and dander 04/27/2020   Allergic rhinitis due to pollen 04/27/2020   Anxiety    Anxiety and depression 01/20/2018   Arthritis    Asthma    Asthma, persistent controlled 10/05/2018   B12 deficiency 11/27/2019   Borderline diabetes 12/17/2015   Breast cancer (HCC)    Cancer (HCC) 2019   right breast cancer   CERVICAL CANCER, HX OF 12/06/2006   Qualifier: Diagnosis of  By: Mavis MD, John E    Chronic sinusitis 01/26/2015   Chronic venous insufficiency 09/02/2015   Colon polyps    Congestive heart failure (HCC) 04/29/2020   Controlled type 2 diabetes mellitus without complication, without long-term current use of insulin  (HCC) 05/28/2017   Coronary artery disease 25% of RCA, 20% intermediate branch, 40% diagonal branch based on cardiac cath from 2022 07/22/2020   Crohn disease (HCC) 11/29/2016   Depression    Diabetes mellitus without complication (HCC)    type 2   Dilated cardiomyopathy (HCC) ejection fraction 25% by echocardiogram from January 2022 04/29/2020   Dyspnea 09/15/2014   Elevated triglycerides with high cholesterol 11/26/2013   Environmental allergies    Eustachian tube dysfunction 12/04/2014   Ex-smoker 11/18/2015   Family history of breast cancer    Genetic testing 07/16/2017   Negative genetic testing on the multi-cancer panel.  The Multi-Gene Panel offered by [name] includes sequencing and/or deletion duplication testing of the following 83 genes: ALK, APC, ATM, AXIN2,BAP1,  BARD1, BLM, BMPR1A, BRCA1, BRCA2, BRIP1, CASR, CDC73, CDH1, CDK4, CDKN1B, CDKN1C, CDKN2A (p14ARF), CDKN2A (p16INK4a), CEBPA,  CHEK2, CTNNA1, DICER1, DIS3L2, EGFR (c.2369C>T, p.Thr790Met variant onl   GERD (gastroesophageal reflux disease)    HEADACHE 12/06/2006   Qualifier: Diagnosis of  By: Mavis MD, John E    Hemothorax on left 10/02/2020   History of adenomatous polyp of colon 11/19/2017   Hyperlipidemia    Hypertension    Hypothyroidism    IDA (iron  deficiency anemia) 11/06/2019   Impaired fasting glucose 12/12/2017   Insomnia 08/20/2013   Irritable bowel syndrome 11/29/2006   Qualifier: Diagnosis of  By: Mavis MD, John E    Left sided abdominal pain 12/13/2017   Liver hemangioma 08/24/2014   Malignant neoplasm of upper-outer quadrant of right breast in female, 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   Migraines    Mild persistent asthma, uncomplicated 04/27/2020   Nasal polyp, unspecified 01/10/2018   Oral herpes 04/20/2013   Other allergic rhinitis 09/15/2014   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 10/02/2020   Personal history of radiation therapy 2019   36 radiation tx   PONV (postoperative nausea and vomiting)    scopolamine  patch helps   Recurrent infections 10/05/2018   Right upper lobe pulmonary nodule 01/10/2018   Symptomatic varicose veins, right 09/02/2015   Ulcerative colitis (HCC)    Varicose veins     Past Surgical History:  Procedure Laterality Date   BREAST BIOPSY Right 02/2017   BREAST BIOPSY Right 11/07/2019   BREAST LUMPECTOMY Right 04/24/2017   BREAST LUMPECTOMY WITH RADIOACTIVE SEED AND SENTINEL LYMPH NODE BIOPSY Right 04/24/2017   Procedure: RIGHT BREAST LUMPECTOMY WITH RADIOACTIVE SEED AND SENTINEL LYMPH NODE BIOPSY;  Surgeon: Aron Shoulders, MD;  Location: [facility];  Service: General;  Laterality: Right;   CORONARY ARTERY BYPASS GRAFT     crapal tunnel relase Left 05/2018   ENDOMETRIAL ABLATION  2010   Had abnormal uterine bleeding (heavy and frequent), performed in [place], menses stopped completely 2 years after   ETHMOIDECTOMY  Right 02/16/2023   Procedure: RIGHT TOTAL ETHMOIDECTOMY;  Surgeon: Karis Clunes, MD;  Location: [facility] OR;  Service: ENT;  Laterality: Right;   EXPLORATORY LAPAROTOMY     Had exploratory surgery at the age of 68 for abdominal pain with cyst found on her left ovary, not treated surgically   FOOT SURGERY Right 2012   GENERATOR REMOVAL  10/02/2020   Procedure: GENERATOR REMOVAL;  Surgeon: Dusty Sudie DEL, MD;  Location: [facility] OR;  Service: Thoracic;;   HAND SURGERY Right    ICD IMPLANT N/A 10/01/2020   Procedure: ICD IMPLANT;  Surgeon: Inocencio Soyla Lunger, MD;  Location: [facility];  Service: Cardiovascular;  Laterality: N/A;   ICD LEAD REMOVAL N/A 10/02/2020   Procedure: ICD LEAD REMOVAL;  Surgeon: Dusty Sudie DEL, MD;  Location: [facility] OR;  Service: Thoracic;  Laterality: N/A;   MAXILLARY ANTROSTOMY Right 02/16/2023   Procedure: RIGHT MAXILLARY ANTROSTOMY WITH TISSUE REMOVAL;  Surgeon: Karis Clunes, MD;  Location: [facility] OR;  Service: ENT;  Laterality: Right;   MEDIASTERNOTOMY N/A 10/02/2020   Procedure: MEDIAN STERNOTOMY REPAIR PERFORATED VENTRICLE EVACUATION LEFT HEMOTHORAX;  Surgeon: Dusty Sudie DEL, MD;  Location: [facility] OR;  Service: Thoracic;  Laterality: N/A;   RE-EXCISION OF BREAST LUMPECTOMY Right 05/15/2017   Procedure: RE-EXCISION OF BREAST LUMPECTOMY;  Surgeon: Aron Shoulders, MD;  Location: [facility];  Service: General;  Laterality: Right;   RIGHT/LEFT HEART CATH AND CORONARY ANGIOGRAPHY N/A 06/21/2020   Procedure: RIGHT/LEFT HEART CATH AND CORONARY ANGIOGRAPHY;  Surgeon: Mady Bruckner, MD;  Location: [facility];  Service: Cardiovascular;  Laterality: N/A;   ROBOTIC ASSISTED SALPINGO OOPHERECTOMY Bilateral 08/29/2019   Procedure: XI ROBOTIC ASSISTED SALPINGO OOPHORECTOMY;  Surgeon: Viktoria Comer SAUNDERS, MD;  Location: [facility];  Service: Gynecology;  Laterality: Bilateral;   Septoplasty with turbinate reduction     SINUS ENDO WITH FUSION Right 02/16/2023    Procedure: SINUS ENDO WITH FUSION;  Surgeon: Karis Clunes, MD;  Location: [facility] OR;  Service: ENT;  Laterality: Right;   SPHENOIDECTOMY Right 02/16/2023   Procedure: RIGHT SPHENOIDECTOMY WITH TISSUE REMOVAL;  Surgeon: Karis Clunes, MD;  Location: [facility] OR;  Service: ENT;  Laterality: Right;   TEE WITHOUT CARDIOVERSION N/A 10/02/2020   Procedure: TRANSESOPHAGEAL ECHOCARDIOGRAM (TEE);  Surgeon: Dusty Sudie DEL, MD;  Location: [facility] OR;  Service: Thoracic;  Laterality: N/A;   TONSILLECTOMY  1980   TOTAL HIP ARTHROPLASTY Right 12/20/2021   Procedure: RIGHT TOTAL HIP ARTHROPLASTY ANTERIOR APPROACH;  Surgeon: Beverley Evalene BIRCH, MD;  Location: [facility] OR;  Service: Orthopedics;  Laterality: Right;   TOTAL HIP ARTHROPLASTY Left 05/23/2022   Procedure: TOTAL HIP ARTHROPLASTY ANTERIOR APPROACH;  Surgeon: Beverley Evalene BIRCH, MD;  Location: [facility] OR;  Service: Orthopedics;  Laterality: Left;   VEIN SURGERY     Removal Right Leg   WISDOM TOOTH EXTRACTION  Social History[1]  Family History  Problem Relation Age of Onset   Arthritis Mother 12       Deceased   Breast cancer Mother    Lung cancer Mother    Hyperlipidemia Mother    Heart disease Mother    Hypertension Mother    Diabetes Mother    Crohn's disease Mother    Diabetes Maternal Grandfather    Heart disease Maternal Grandfather    Heart attack Maternal Grandfather    Leukemia Maternal Grandfather    Colitis Sister    Leukemia Cousin 3       mat cousin   Lung cancer Father    Brain cancer Father     Allergies[2]  Medication list has been reviewed and updated.  Medications Ordered Prior to Encounter[3]  Review of Systems:  As per HPI- otherwise negative.   Physical Examination: There were no vitals filed for this visit. There were no vitals filed for this visit. There is no height or weight on file to calculate BMI. Ideal Body Weight:    GEN: no acute distress. HEENT: Atraumatic, Normocephalic.  Ears and Nose: No external deformity. CV: RRR,  No M/G/R. No JVD. No thrill. No extra heart sounds. PULM: CTA B, no wheezes, crackles, rhonchi. No retractions. No resp. distress. No accessory muscle use. ABD: S, NT, ND, +BS. No rebound. No HSM. EXTR: No c/c/e PSYCH: Normally interactive. Conversant.    Assessment and Plan: No diagnosis found.  Assessment & Plan   Signed Harlene Schroeder, MD    [1]  Social History Tobacco Use   Smoking status: Former    Current packs/day: 0.00    Average packs/day: 3.0 packs/day for 15.0 years (45.0 ttl pk-yrs)    Types: Cigarettes    Start date: 09/15/1978    Quit date: 09/14/1993    Years since quitting: 30.5   Smokeless tobacco: Never   Tobacco comments:    Quit 26 yrs ago  Vaping Use   Vaping status: Never Used  Substance Use Topics   Alcohol use: Yes    Alcohol/week: 1.0 - 2.0 standard drink of alcohol    Types: 1 - 2 Glasses of wine per week    Comment: occ   Drug use: No  [2]  Allergies Allergen Reactions   Losartan Potassium Shortness Of Breath   Ace Inhibitors Cough   Aspirin      Uncoated Aspirin  upsets stomach   Ceclor [Cefaclor] Hives    Has tolerated Ancef  in 2022   Sulfa Antibiotics Hives and Other (See Comments)   Lisinopril Cough  [3]  Current Outpatient Medications on File Prior to Visit  Medication Sig Dispense Refill   acetaminophen  (TYLENOL ) 500 MG tablet Take 2 tablets (1,000 mg total) by mouth every 6 (six) hours as needed for moderate pain or mild pain. 30 tablet 0   acyclovir  (ZOVIRAX ) 400 MG tablet Take 1 tablet (400 mg total) by mouth 3 (three) times daily as needed. 30 tablet 1   Adalimumab 80 MG/0.8ML PNKT Inject 80 mg into the skin every 14 (fourteen) days.     albuterol  (VENTOLIN  HFA) 108 (90 Base) MCG/ACT inhaler Inhale 2 puffs into the lungs every 6 (six) hours as needed for shortness of breath.     Azelastine  HCl 0.15 % SOLN Place 2 sprays into both nostrils 2 (two) times daily.     Blood Glucose Monitoring Suppl DEVI 1 each by Does not apply  route in the morning, at noon, and at bedtime. May substitute  to any manufacturer covered by patient's insurance. 1 each 0   Calcium  Carb-Cholecalciferol (CALCIUM  600 + D PO) Take 1 tablet by mouth daily.     carvedilol  (COREG ) 12.5 MG tablet TAKE 1.5 TABLETS (18.75 MG TOTAL) BY MOUTH 2 (TWO) TIMES DAILY. 270 tablet 3   cetirizine  (ZYRTEC ) 10 MG tablet Take 10 mg by mouth 2 (two) times daily.     dapagliflozin  propanediol (FARXIGA ) 10 MG TABS tablet Take 1 tablet (10 mg total) by mouth daily before breakfast. 30 tablet 11   doxycycline  (VIBRAMYCIN ) 100 MG capsule Take 1 capsule (100 mg total) by mouth 2 (two) times daily. (Patient not taking: Reported on 03/25/2024) 20 capsule 0   EPINEPHrine  0.3 mg/0.3 mL IJ SOAJ injection Inject 0.3 mg into the muscle as needed for anaphylaxis.     famotidine  (PEPCID ) 20 MG tablet Take 20 mg by mouth 2 (two) times daily.     glucose blood (ACCU-CHEK GUIDE TEST) test strip USE AS DIRECTED IN THE MORNING, AT NOON, AND AT BEDTIME. 300 strip 1   Hyoscyamine  Sulfate 0.375 MG TBCR Take 0.375 mg by mouth 2 (two) times daily as needed (stomach cramping).     icosapent  Ethyl (VASCEPA ) 1 g capsule TAKE 2 CAPSULES BY MOUTH 2 TIMES DAILY. 200 capsule 5   levothyroxine  (SYNTHROID ) 75 MCG tablet TAKE 1 TABLET BY MOUTH DAILY BEFORE BREAKFAST. 90 tablet 0   loperamide (IMODIUM A-D) 2 MG tablet Take 2 mg by mouth 4 (four) times daily as needed for diarrhea or loose stools.     MAGNESIUM  PO Take 250 mg by mouth daily.     mesalamine  (LIALDA ) 1.2 g EC tablet Take 2.4 g by mouth in the morning and at bedtime.      metFORMIN  (GLUCOPHAGE ) 500 MG tablet Take 1 tablet (500 mg total) by mouth 2 (two) times daily with a meal. 180 tablet 1   montelukast  (SINGULAIR ) 10 MG tablet Take 10 mg by mouth at bedtime.      Multiple Vitamin (MULTIVITAMIN WITH MINERALS) TABS tablet Take 1 tablet by mouth daily.     pantoprazole  (PROTONIX ) 40 MG tablet TAKE 1 TABLET BY MOUTH EVERY DAY 90 tablet 1    predniSONE  (DELTASONE ) 20 MG tablet Take 40 mg by mouth daily for 3-5 days 10 tablet 0   promethazine  (PHENERGAN ) 12.5 MG tablet Take 1 tablet (12.5 mg total) by mouth every 6 (six) hours as needed for nausea or vomiting. 30 tablet 3   rosuvastatin  (CRESTOR ) 20 MG tablet TAKE 1 TABLET BY MOUTH EVERY DAY 90 tablet 1   sacubitril -valsartan  (ENTRESTO ) 97-103 MG Take 1 tablet by mouth 2 (two) times daily. 60 tablet 11   spironolactone  (ALDACTONE ) 25 MG tablet TAKE 1 TABLET (25 MG TOTAL) BY MOUTH DAILY. 90 tablet 3   SYMBICORT  160-4.5 MCG/ACT inhaler INHALE 2 PUFFS INTO THE LUNGS TWICE A DAY 30.6 each 1   triamcinolone  (NASACORT ) 55 MCG/ACT AERO nasal inhaler Place 2 sprays into the nose daily.     venlafaxine  XR (EFFEXOR -XR) 150 MG 24 hr capsule TAKE 1 CAPSULE BY MOUTH DAILY WITH BREAKFAST. 90 capsule 1   zinc  gluconate 50 MG tablet Take 50 mg by mouth daily.     zolpidem  (AMBIEN ) 5 MG tablet TAKE 1 TABLET BY MOUTH EVERY DAY AT BEDTIME AS NEEDED FOR SLEEP STRENGTH: 5 MG 30 tablet 2   No current facility-administered medications on file prior to visit.

## 2024-04-02 ENCOUNTER — Ambulatory Visit: Admitting: Family Medicine

## 2024-04-02 ENCOUNTER — Encounter: Payer: Self-pay | Admitting: Family Medicine

## 2024-04-02 DIAGNOSIS — J0111 Acute recurrent frontal sinusitis: Secondary | ICD-10-CM | POA: Diagnosis not present

## 2024-04-02 MED ORDER — DOXYCYCLINE HYCLATE 100 MG PO CAPS: 100.0000 mg | ORAL_CAPSULE | Freq: Two times a day (BID) | ORAL | 0 refills | Status: DC

## 2024-04-02 MED ORDER — PREDNISONE 20 MG PO TABS: ORAL_TABLET | ORAL | 0 refills | Status: AC

## 2024-04-03 ENCOUNTER — Ambulatory Visit

## 2024-04-04 ENCOUNTER — Other Ambulatory Visit

## 2024-04-09 ENCOUNTER — Other Ambulatory Visit: Payer: Self-pay | Admitting: Family Medicine

## 2024-04-09 ENCOUNTER — Other Ambulatory Visit (HOSPITAL_COMMUNITY): Payer: Self-pay | Admitting: Cardiology

## 2024-04-09 DIAGNOSIS — E034 Atrophy of thyroid (acquired): Secondary | ICD-10-CM

## 2024-04-16 ENCOUNTER — Other Ambulatory Visit (HOSPITAL_COMMUNITY): Payer: Self-pay | Admitting: Cardiology

## 2024-04-16 MED ORDER — SPIRONOLACTONE 25 MG PO TABS: 25.0000 mg | ORAL_TABLET | Freq: Every day | ORAL | 3 refills | Status: AC

## 2024-04-24 ENCOUNTER — Ambulatory Visit
Admission: RE | Admit: 2024-04-24 | Discharge: 2024-04-24 | Disposition: A | Discharge status: Home / Self Care | Source: Ambulatory Visit | Attending: Hematology | Admitting: Hematology

## 2024-04-24 DIAGNOSIS — Z1231 Encounter for screening mammogram for malignant neoplasm of breast: Secondary | ICD-10-CM

## 2024-04-30 ENCOUNTER — Other Ambulatory Visit: Payer: Self-pay

## 2024-04-30 ENCOUNTER — Other Ambulatory Visit: Payer: Self-pay | Admitting: Hematology

## 2024-04-30 DIAGNOSIS — R928 Other abnormal and inconclusive findings on diagnostic imaging of breast: Secondary | ICD-10-CM

## 2024-05-03 ENCOUNTER — Ambulatory Visit
Admission: RE | Admit: 2024-05-03 | Discharge: 2024-05-03 | Disposition: A | Discharge status: Home / Self Care | Source: Ambulatory Visit | Attending: Hematology | Admitting: Hematology

## 2024-05-03 DIAGNOSIS — R928 Other abnormal and inconclusive findings on diagnostic imaging of breast: Secondary | ICD-10-CM

## 2024-05-04 NOTE — Progress Notes (Unsigned)
 [facility] [address] [place], [place] [address] [phone]  Date:  05/08/2024   Name:  Erin Fields   DOB:  1965-12-23   MRN:  [id number]  PCP:  Watt Harlene BROCKS, MD    Chief Complaint: No chief complaint on file.   History of Present Illness:  Erin Fields is a 59 y.o. very pleasant female patient who presents with the following:  Patient seen today with concern of cough.  I saw her most recently on December 17 at which time she had concern of cough and recurrent sinus infection -history significant for right-sided breast cancer, type 2 diabetes, ulcerative colitis on biologic, nonobstructive coronary artery disease, hypertension, hyperlipidemia, previous history of cardiomyopathy and ICD placement with right ventricular perforation, bilateral hip replacement  Followed by oncology and advanced CHF clinic  She also uses nocturnal oxygen   She has been seen by ENT, Dr. Karis saw her in December He noted history of previous sinus surgeries to treat chronic sinusitis in 2024.  She does have a history of fungal sinusitis as well  Lab Results  Component Value Date   HGBA1C 7.1 (H) 03/03/2024     Discussed the use of AI scribe software for clinical note transcription with the patient, who gave verbal consent to proceed.  History of Present Illness    Patient Active Problem List   Diagnosis Date Noted   Microalbuminuria due to type 2 diabetes mellitus (HCC) 12/03/2023   Chronic ethmoidal sinusitis 02/23/2023   Chronic sphenoidal sinusitis 02/23/2023   Polyp of nasal sinus 02/16/2023   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   DJD (degenerative joint disease) 12/20/2021   S/P total right hip arthroplasty 12/20/2021   Diabetes mellitus with insulin  therapy (HCC) 10/24/2021   Lymphedema of breast 10/24/2021   Breast cancer (HCC) 05/11/2021   Hemothorax on left 10/02/2020   S/P evacuation of  hematoma 10/02/2020   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 10/02/2020   Hypovolemic shock (HCC) 10/02/2020   Anxiety    Coronary artery disease 25% of RCA, 20% intermediate branch, 40% diagonal branch based on cardiac cath from 2022 07/22/2020   Dilated cardiomyopathy (HCC) ejection fraction 25% by echocardiogram from January 2022 04/29/2020   Congestive heart failure (HCC) 04/29/2020   Allergic rhinitis due to animal (cat) (dog) hair and dander 04/27/2020   Allergic rhinitis due to pollen 04/27/2020   Mild persistent asthma, uncomplicated 04/27/2020   PONV (postoperative nausea and vomiting)    Migraines    GERD (gastroesophageal reflux disease)    Environmental allergies    Diabetes mellitus without complication (HCC)    Depression    Colon polyps    B12 deficiency 11/27/2019   IDA (iron  deficiency anemia) 11/06/2019   Recurrent infections 10/05/2018   Anxiety and depression 01/20/2018   Right upper lobe pulmonary nodule 01/10/2018   Nasal polyp, unspecified 01/10/2018   Left sided abdominal pain 12/13/2017   Impaired fasting glucose 12/12/2017   History of adenomatous polyp of colon 11/19/2017   Genetic testing 07/16/2017   Family history of breast cancer    Controlled type 2 diabetes mellitus without complication, without long-term current use of insulin  (HCC) 05/28/2017   Personal history of radiation therapy 2019   Cancer (HCC) 2019   Malignant neoplasm of upper-outer quadrant of right breast in female, 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   Crohn disease (HCC) 11/29/2016   Borderline diabetes  12/17/2015   Ex-smoker 11/18/2015   Chronic venous insufficiency 09/02/2015   Symptomatic varicose veins, right 09/02/2015   Chronic maxillary sinusitis 01/26/2015   Eustachian tube dysfunction 12/04/2014   Other allergic rhinitis 09/15/2014   Dyspnea 09/15/2014   Liver hemangioma 08/24/2014   Elevated triglycerides with high cholesterol 11/26/2013    Insomnia 08/20/2013   Hypertension 08/20/2013   Ulcerative colitis (HCC) 04/20/2013   Oral herpes 04/20/2013   Hyperlipidemia 12/06/2006   HEADACHE 12/06/2006   CERVICAL CANCER, HX OF 12/06/2006   Hypothyroidism 11/29/2006   Asthma 11/29/2006   IRRITABLE BOWEL SYNDROME 11/29/2006    Past Medical History:  Diagnosis Date   Allergic rhinitis due to animal (cat) (dog) hair and dander 04/27/2020   Allergic rhinitis due to pollen 04/27/2020   Anxiety    Anxiety and depression 01/20/2018   Arthritis    Asthma    Asthma, persistent controlled 10/05/2018   B12 deficiency 11/27/2019   Borderline diabetes 12/17/2015   Breast cancer (HCC)    Cancer (HCC) 2019   right breast cancer   CERVICAL CANCER, HX OF 12/06/2006   Qualifier: Diagnosis of  By: Mavis MD, John E    Chronic sinusitis 01/26/2015   Chronic venous insufficiency 09/02/2015   Colon polyps    Congestive heart failure (HCC) 04/29/2020   Controlled type 2 diabetes mellitus without complication, without long-term current use of insulin  (HCC) 05/28/2017   Coronary artery disease 25% of RCA, 20% intermediate branch, 40% diagonal branch based on cardiac cath from 2022 07/22/2020   Crohn disease (HCC) 11/29/2016   Depression    Diabetes mellitus without complication (HCC)    type 2   Dilated cardiomyopathy (HCC) ejection fraction 25% by echocardiogram from January 2022 04/29/2020   Dyspnea 09/15/2014   Elevated triglycerides with high cholesterol 11/26/2013   Environmental allergies    Eustachian tube dysfunction 12/04/2014   Ex-smoker 11/18/2015   Family history of breast cancer    Genetic testing 07/16/2017   Negative genetic testing on the multi-cancer panel.  The Multi-Gene Panel offered by [name] includes sequencing and/or deletion duplication testing of the following 83 genes: ALK, APC, ATM, AXIN2,BAP1,  BARD1, BLM, BMPR1A, BRCA1, BRCA2, BRIP1, CASR, CDC73, CDH1, CDK4, CDKN1B, CDKN1C, CDKN2A (p14ARF), CDKN2A  (p16INK4a), CEBPA, CHEK2, CTNNA1, DICER1, DIS3L2, EGFR (c.2369C>T, p.Thr790Met variant onl   GERD (gastroesophageal reflux disease)    HEADACHE 12/06/2006   Qualifier: Diagnosis of  By: Mavis MD, John E    Hemothorax on left 10/02/2020   History of adenomatous polyp of colon 11/19/2017   Hyperlipidemia    Hypertension    Hypothyroidism    IDA (iron  deficiency anemia) 11/06/2019   Impaired fasting glucose 12/12/2017   Insomnia 08/20/2013   Irritable bowel syndrome 11/29/2006   Qualifier: Diagnosis of  By: Mavis MD, John E    Left sided abdominal pain 12/13/2017   Liver hemangioma 08/24/2014   Malignant neoplasm of upper-outer quadrant of right breast in female, 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   Migraines    Mild persistent asthma, uncomplicated 04/27/2020   Nasal polyp, unspecified 01/10/2018   Oral herpes 04/20/2013   Other allergic rhinitis 09/15/2014   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 10/02/2020   Personal history of radiation therapy 2019   36 radiation tx   PONV (postoperative nausea and vomiting)    scopolamine  patch helps   Recurrent infections 10/05/2018   Right upper lobe pulmonary nodule 01/10/2018   Symptomatic varicose veins, right 09/02/2015   Ulcerative colitis (HCC)  Varicose veins     Past Surgical History:  Procedure Laterality Date   BREAST BIOPSY Right 02/2017   BREAST BIOPSY Right 11/07/2019   BREAST LUMPECTOMY Right 04/24/2017   BREAST LUMPECTOMY WITH RADIOACTIVE SEED AND SENTINEL LYMPH NODE BIOPSY Right 04/24/2017   Procedure: RIGHT BREAST LUMPECTOMY WITH RADIOACTIVE SEED AND SENTINEL LYMPH NODE BIOPSY;  Surgeon: Aron Shoulders, MD;  Location: [facility];  Service: General;  Laterality: Right;   CORONARY ARTERY BYPASS GRAFT     crapal tunnel relase Left 05/2018   ENDOMETRIAL ABLATION  2010   Had abnormal uterine bleeding (heavy and frequent), performed in [place], menses stopped completely 2 years  after   ETHMOIDECTOMY Right 02/16/2023   Procedure: RIGHT TOTAL ETHMOIDECTOMY;  Surgeon: Karis Clunes, MD;  Location: [facility] OR;  Service: ENT;  Laterality: Right;   EXPLORATORY LAPAROTOMY     Had exploratory surgery at the age of 63 for abdominal pain with cyst found on her left ovary, not treated surgically   FOOT SURGERY Right 2012   GENERATOR REMOVAL  10/02/2020   Procedure: GENERATOR REMOVAL;  Surgeon: Dusty Sudie DEL, MD;  Location: [facility] OR;  Service: Thoracic;;   HAND SURGERY Right    ICD IMPLANT N/A 10/01/2020   Procedure: ICD IMPLANT;  Surgeon: Inocencio Soyla Lunger, MD;  Location: [facility];  Service: Cardiovascular;  Laterality: N/A;   ICD LEAD REMOVAL N/A 10/02/2020   Procedure: ICD LEAD REMOVAL;  Surgeon: Dusty Sudie DEL, MD;  Location: [facility] OR;  Service: Thoracic;  Laterality: N/A;   MAXILLARY ANTROSTOMY Right 02/16/2023   Procedure: RIGHT MAXILLARY ANTROSTOMY WITH TISSUE REMOVAL;  Surgeon: Karis Clunes, MD;  Location: [facility] OR;  Service: ENT;  Laterality: Right;   MEDIASTERNOTOMY N/A 10/02/2020   Procedure: MEDIAN STERNOTOMY REPAIR PERFORATED VENTRICLE EVACUATION LEFT HEMOTHORAX;  Surgeon: Dusty Sudie DEL, MD;  Location: [facility] OR;  Service: Thoracic;  Laterality: N/A;   RE-EXCISION OF BREAST LUMPECTOMY Right 05/15/2017   Procedure: RE-EXCISION OF BREAST LUMPECTOMY;  Surgeon: Aron Shoulders, MD;  Location: [facility];  Service: General;  Laterality: Right;   RIGHT/LEFT HEART CATH AND CORONARY ANGIOGRAPHY N/A 06/21/2020   Procedure: RIGHT/LEFT HEART CATH AND CORONARY ANGIOGRAPHY;  Surgeon: Mady Bruckner, MD;  Location: [facility];  Service: Cardiovascular;  Laterality: N/A;   ROBOTIC ASSISTED SALPINGO OOPHERECTOMY Bilateral 08/29/2019   Procedure: XI ROBOTIC ASSISTED SALPINGO OOPHORECTOMY;  Surgeon: Viktoria Comer SAUNDERS, MD;  Location: [facility];  Service: Gynecology;  Laterality: Bilateral;   Septoplasty with turbinate reduction     SINUS ENDO WITH  FUSION Right 02/16/2023   Procedure: SINUS ENDO WITH FUSION;  Surgeon: Karis Clunes, MD;  Location: [facility] OR;  Service: ENT;  Laterality: Right;   SPHENOIDECTOMY Right 02/16/2023   Procedure: RIGHT SPHENOIDECTOMY WITH TISSUE REMOVAL;  Surgeon: Karis Clunes, MD;  Location: [facility] OR;  Service: ENT;  Laterality: Right;   TEE WITHOUT CARDIOVERSION N/A 10/02/2020   Procedure: TRANSESOPHAGEAL ECHOCARDIOGRAM (TEE);  Surgeon: Dusty Sudie DEL, MD;  Location: [facility] OR;  Service: Thoracic;  Laterality: N/A;   TONSILLECTOMY  1980   TOTAL HIP ARTHROPLASTY Right 12/20/2021   Procedure: RIGHT TOTAL HIP ARTHROPLASTY ANTERIOR APPROACH;  Surgeon: Beverley Evalene BIRCH, MD;  Location: [facility] OR;  Service: Orthopedics;  Laterality: Right;   TOTAL HIP ARTHROPLASTY Left 05/23/2022   Procedure: TOTAL HIP ARTHROPLASTY ANTERIOR APPROACH;  Surgeon: Beverley Evalene BIRCH, MD;  Location: [facility] OR;  Service: Orthopedics;  Laterality: Left;   VEIN SURGERY  Removal Right Leg   WISDOM TOOTH EXTRACTION      Social History[1]  Family History  Problem Relation Age of Onset   Arthritis Mother 29       Deceased   Breast cancer Mother    Lung cancer Mother    Hyperlipidemia Mother    Heart disease Mother    Hypertension Mother    Diabetes Mother    Crohn's disease Mother    Diabetes Maternal Grandfather    Heart disease Maternal Grandfather    Heart attack Maternal Grandfather    Leukemia Maternal Grandfather    Colitis Sister    Leukemia Cousin 3       mat cousin   Lung cancer Father    Brain cancer Father     Allergies[2]  Medication list has been reviewed and updated.  Medications Ordered Prior to Encounter[3]  Review of Systems:  As per HPI- otherwise negative.   Physical Examination: There were no vitals filed for this visit. There were no vitals filed for this visit. There is no height or weight on file to calculate BMI. Ideal Body Weight:    GEN: no acute distress. HEENT: Atraumatic, Normocephalic.  Ears and Nose: No  external deformity. CV: RRR, No M/G/R. No JVD. No thrill. No extra heart sounds. PULM: CTA B, no wheezes, crackles, rhonchi. No retractions. No resp. distress. No accessory muscle use. ABD: S, NT, ND, +BS. No rebound. No HSM. EXTR: No c/c/e PSYCH: Normally interactive. Conversant.    Assessment and Plan: No diagnosis found.  Assessment & Plan   Signed Harlene Schroeder, MD    [1]  Social History Tobacco Use   Smoking status: Former    Current packs/day: 0.00    Average packs/day: 3.0 packs/day for 15.0 years (45.0 ttl pk-yrs)    Types: Cigarettes    Start date: 09/15/1978    Quit date: 09/14/1993    Years since quitting: 30.6   Smokeless tobacco: Never   Tobacco comments:    Quit 26 yrs ago  Vaping Use   Vaping status: Never Used  Substance Use Topics   Alcohol use: Yes    Alcohol/week: 1.0 - 2.0 standard drink of alcohol    Types: 1 - 2 Glasses of wine per week    Comment: occ   Drug use: No  [2]  Allergies Allergen Reactions   Losartan Potassium Shortness Of Breath   Ace Inhibitors Cough   Aspirin      Uncoated Aspirin  upsets stomach   Ceclor [Cefaclor] Hives    Has tolerated Ancef  in 2022   Sulfa Antibiotics Hives and Other (See Comments)   Lisinopril Cough  [3]  Current Outpatient Medications on File Prior to Visit  Medication Sig Dispense Refill   acetaminophen  (TYLENOL ) 500 MG tablet Take 2 tablets (1,000 mg total) by mouth every 6 (six) hours as needed for moderate pain or mild pain. 30 tablet 0   acyclovir  (ZOVIRAX ) 400 MG tablet Take 1 tablet (400 mg total) by mouth 3 (three) times daily as needed. 30 tablet 1   Adalimumab 80 MG/0.8ML PNKT Inject 80 mg into the skin every 14 (fourteen) days.     albuterol  (VENTOLIN  HFA) 108 (90 Base) MCG/ACT inhaler Inhale 2 puffs into the lungs every 6 (six) hours as needed for shortness of breath.     Azelastine  HCl 0.15 % SOLN Place 2 sprays into both nostrils 2 (two) times daily.     Blood Glucose Monitoring Suppl  DEVI 1 each by Does  not apply route in the morning, at noon, and at bedtime. May substitute to any manufacturer covered by patient's insurance. 1 each 0   Calcium  Carb-Cholecalciferol (CALCIUM  600 + D PO) Take 1 tablet by mouth daily.     carvedilol  (COREG ) 12.5 MG tablet TAKE 1.5 TABLETS (18.75 MG TOTAL) BY MOUTH 2 (TWO) TIMES DAILY. 270 tablet 3   cetirizine  (ZYRTEC ) 10 MG tablet Take 10 mg by mouth 2 (two) times daily.     dapagliflozin  propanediol (FARXIGA ) 10 MG TABS tablet Take 1 tablet (10 mg total) by mouth daily before breakfast. 30 tablet 11   doxycycline  (VIBRAMYCIN ) 100 MG capsule Take 1 capsule (100 mg total) by mouth 2 (two) times daily. 20 capsule 0   EPINEPHrine  0.3 mg/0.3 mL IJ SOAJ injection Inject 0.3 mg into the muscle as needed for anaphylaxis.     famotidine  (PEPCID ) 20 MG tablet Take 20 mg by mouth 2 (two) times daily.     glucose blood (ACCU-CHEK GUIDE TEST) test strip USE AS DIRECTED IN THE MORNING, AT NOON, AND AT BEDTIME. 300 strip 1   Hyoscyamine  Sulfate 0.375 MG TBCR Take 0.375 mg by mouth 2 (two) times daily as needed (stomach cramping).     levothyroxine  (SYNTHROID ) 75 MCG tablet TAKE 1 TABLET BY MOUTH DAILY BEFORE BREAKFAST. 90 tablet 0   loperamide (IMODIUM A-D) 2 MG tablet Take 2 mg by mouth 4 (four) times daily as needed for diarrhea or loose stools.     MAGNESIUM  PO Take 250 mg by mouth daily.     mesalamine  (LIALDA ) 1.2 g EC tablet Take 2.4 g by mouth in the morning and at bedtime.      metFORMIN  (GLUCOPHAGE ) 500 MG tablet TAKE 1 TABLET BY MOUTH 2 TIMES DAILY WITH A MEAL. 180 tablet 1   montelukast  (SINGULAIR ) 10 MG tablet Take 10 mg by mouth at bedtime.      Multiple Vitamin (MULTIVITAMIN WITH MINERALS) TABS tablet Take 1 tablet by mouth daily.     pantoprazole  (PROTONIX ) 40 MG tablet TAKE 1 TABLET BY MOUTH EVERY DAY 90 tablet 1   predniSONE  (DELTASONE ) 20 MG tablet Take 40 mg by mouth daily for 3-5 days 10 tablet 0   promethazine  (PHENERGAN ) 12.5 MG tablet  Take 1 tablet (12.5 mg total) by mouth every 6 (six) hours as needed for nausea or vomiting. 30 tablet 3   rosuvastatin  (CRESTOR ) 20 MG tablet TAKE 1 TABLET BY MOUTH EVERY DAY 90 tablet 1   sacubitril -valsartan  (ENTRESTO ) 97-103 MG Take 1 tablet by mouth 2 (two) times daily. 60 tablet 11   spironolactone  (ALDACTONE ) 25 MG tablet Take 1 tablet (25 mg total) by mouth daily. 90 tablet 3   SYMBICORT  160-4.5 MCG/ACT inhaler INHALE 2 PUFFS INTO THE LUNGS TWICE A DAY 30.6 each 1   triamcinolone  (NASACORT ) 55 MCG/ACT AERO nasal inhaler Place 2 sprays into the nose daily.     VASCEPA  1 g capsule TAKE 2 CAPSULES BY MOUTH TWICE A DAY 360 capsule 3   venlafaxine  XR (EFFEXOR -XR) 150 MG 24 hr capsule TAKE 1 CAPSULE BY MOUTH DAILY WITH BREAKFAST. 90 capsule 1   zinc  gluconate 50 MG tablet Take 50 mg by mouth daily.     zolpidem  (AMBIEN ) 5 MG tablet TAKE 1 TABLET BY MOUTH EVERY DAY AT BEDTIME AS NEEDED FOR SLEEP STRENGTH: 5 MG 30 tablet 2   No current facility-administered medications on file prior to visit.   "

## 2024-05-06 ENCOUNTER — Telehealth (HOSPITAL_COMMUNITY): Payer: Self-pay

## 2024-05-06 NOTE — Telephone Encounter (Signed)
 Called to confirm/remind patient of their appointment at [facility] on 05/07/24. However, patient rescheduled to another day.

## 2024-05-07 ENCOUNTER — Ambulatory Visit: Admitting: Pulmonary Disease

## 2024-05-07 ENCOUNTER — Encounter: Payer: Self-pay | Admitting: Hematology

## 2024-05-07 ENCOUNTER — Encounter: Payer: Self-pay | Admitting: Pulmonary Disease

## 2024-05-07 ENCOUNTER — Ambulatory Visit

## 2024-05-07 ENCOUNTER — Encounter (HOSPITAL_COMMUNITY)

## 2024-05-07 VITALS — BP 103/68 | HR 91 | Temp 98.3°F | Ht 63.0 in | Wt 170.0 lb

## 2024-05-07 DIAGNOSIS — K5190 Ulcerative colitis, unspecified, without complications: Secondary | ICD-10-CM

## 2024-05-07 DIAGNOSIS — I5020 Unspecified systolic (congestive) heart failure: Secondary | ICD-10-CM

## 2024-05-07 DIAGNOSIS — J4540 Moderate persistent asthma, uncomplicated: Secondary | ICD-10-CM

## 2024-05-07 DIAGNOSIS — Z87891 Personal history of nicotine dependence: Secondary | ICD-10-CM | POA: Diagnosis not present

## 2024-05-07 DIAGNOSIS — J40 Bronchitis, not specified as acute or chronic: Secondary | ICD-10-CM

## 2024-05-07 DIAGNOSIS — K219 Gastro-esophageal reflux disease without esophagitis: Secondary | ICD-10-CM

## 2024-05-07 DIAGNOSIS — G4733 Obstructive sleep apnea (adult) (pediatric): Secondary | ICD-10-CM

## 2024-05-07 LAB — PULMONARY FUNCTION TEST
DL/VA % pred: 91 %
DL/VA: 3.87 ml/min/mmHg/L
DLCO unc % pred: 81 %
DLCO unc: 15.86 ml/min/mmHg
FEF 25-75 Post: 2.5 L/s
FEF 25-75 Pre: 2.31 L/s
FEF2575-%Change-Post: 8 %
FEF2575-%Pred-Post: 111 %
FEF2575-%Pred-Pre: 102 %
FEV1-%Change-Post: 2 %
FEV1-%Pred-Post: 93 %
FEV1-%Pred-Pre: 90 %
FEV1-Post: 2.27 L
FEV1-Pre: 2.21 L
FEV1FVC-%Change-Post: -1 %
FEV1FVC-%Pred-Pre: 104 %
FEV6-%Change-Post: 4 %
FEV6-%Pred-Post: 93 %
FEV6-%Pred-Pre: 89 %
FEV6-Post: 2.85 L
FEV6-Pre: 2.72 L
FEV6FVC-%Change-Post: 0 %
FEV6FVC-%Pred-Post: 103 %
FEV6FVC-%Pred-Pre: 103 %
FVC-%Change-Post: 4 %
FVC-%Pred-Post: 90 %
FVC-%Pred-Pre: 86 %
FVC-Post: 2.85 L
FVC-Pre: 2.72 L
Post FEV1/FVC ratio: 80 %
Post FEV6/FVC ratio: 100 %
Pre FEV1/FVC ratio: 81 %
Pre FEV6/FVC Ratio: 100 %
RV % pred: 99 %
RV: 1.95 L
TLC % pred: 96 %
TLC: 4.72 L

## 2024-05-07 LAB — POCT EXHALED NITRIC OXIDE: FeNO level (ppb): 28 (ref ?–50)

## 2024-05-07 MED ORDER — DOXYCYCLINE HYCLATE 100 MG PO TABS: 100.0000 mg | ORAL_TABLET | Freq: Two times a day (BID) | ORAL | 0 refills | Status: AC

## 2024-05-07 MED ORDER — BREZTRI AEROSPHERE 160-9-4.8 MCG/ACT IN AERO: 2.0000 | INHALATION_SPRAY | Freq: Two times a day (BID) | RESPIRATORY_TRACT | 3 refills | Status: DC

## 2024-05-07 NOTE — Progress Notes (Signed)
 Full pft performed today

## 2024-05-07 NOTE — Progress Notes (Signed)
 "              Erin Fields    [id number]    October 17, 1965  Primary Care Physician:Copland, Harlene BROCKS, MD  Referring Physician: Tashi Andujo, MD [address] [place],  [place] [address]  Chief complaint: Follow-up for chronic cough, asthma, GERD  History of Present Illness Erin Fields is a 59 year old female with CHF, GERD, diabetes, hypothyroidism asthma and chronic cough who presents with concerns about low oxygen levels during sleep. She was referred by her primary care physician for evaluation of low oxygen levels during sleep.  Nocturnal hypoxemia - Referred for evaluation of low oxygen levels during sleep - Overnight oximetry in March 2025 showed oxygen saturation dropping below 88% for approximately 12 minutes during an 8-hour period - Significant fatigue upon waking - Oxygen therapy at 2 L/min during sleep was initiated but discontinued due to severe pain and cough with spasms  Chronic cough and asthma exacerbations - Chronic cough with episodes every 4-6 weeks requiring steroid treatment - Asthma managed with Symbicort , Singulair , Nasacort , Astepro , and weekly allergy  shots - Asthma flares are seasonal, with increased symptoms in March, April, and August - Currently on antibiotics for a recent asthma flare - Cough and phlegm production believed to be exacerbated by heart failure medications  Dyspnea and fatigue - Dyspnea associated with asthma and heart failure  Medication intolerance - Oxygen therapy at 2 L/min during sleep caused severe pain and cough with spasms, leading to discontinuation  Gastroesophageal reflux symptoms - Diagnosed with acid reflux - On Pepcid , Protonix , and famotidine  - Scheduled for endoscopy in December 2025 to assess for esophageal stricture  Heart failure symptoms - History of heart failure with previous ejection fraction of 25%, now improved to 45% - On heart failure medications, which are believed to contribute to cough  and phlegm production - Requires periodic steroid use to manage symptoms related to cough and phlegm  Interim history: Erin Fields is a 59 year old female with asthma who presents with persistent respiratory symptoms.  Respiratory symptoms and asthma - Persistent cough with green sputum occurring approximately every four weeks, difficult to clear - Wheezing and shortness of breath - Requires daily albuterol  - Uses Symbicort , Singulair , and albuterol  for asthma management - Recent CT chest and pulmonary function tests were normal - Inflammation test only slightly elevated - History of bronchitis-like episodes with green sputum, previously treated with doxycycline   Sleep apnea - Moderate obstructive sleep apnea - Uses CPAP therapy but tolerates mask for only 4 to 5 hours due to discomfort - Has tried different CPAP masks without improvement in comfort  Gastrointestinal symptoms and ulcerative colitis - Ulcerative colitis managed with Humira - Recent colonoscopy and endoscopy performed in December GI doctor at [facility] - Being monitored for possible esophageal stricture; swallow test planned  Gastroesophageal reflux disease (gerd) - Takes Pepcid  and Protonix  for acid reflux - Planned switch to Aciphex for reflux management  Allergic disease - Receives allergy  immunotherapy - Annual follow up with allergist  Relevant Pulmonary history: Pets: 2 cats Occupation: Works for [employer] Exposures: No mold, hot tub, Financial Controller.  No feather pillows or comforters No h/o chemo/XRT/amiodarone/macrodantin /MTX  No exposure to asbestos, silica or other organic allergens  Smoking history: Significant smoking history: up to three packs per day for 15 years. Quit smoking in 1995 Travel history: No significant travel history Family history:- Mother had lung cancer and emphysema   Outpatient Encounter Medications  as of 05/07/2024  Medication Sig   acetaminophen  (TYLENOL ) 500 MG  tablet Take 2 tablets (1,000 mg total) by mouth every 6 (six) hours as needed for moderate pain or mild pain.   acyclovir  (ZOVIRAX ) 400 MG tablet Take 1 tablet (400 mg total) by mouth 3 (three) times daily as needed.   Adalimumab 80 MG/0.8ML PNKT Inject 80 mg into the skin every 14 (fourteen) days.   albuterol  (VENTOLIN  HFA) 108 (90 Base) MCG/ACT inhaler Inhale 2 puffs into the lungs every 6 (six) hours as needed for shortness of breath.   Azelastine  HCl 0.15 % SOLN Place 2 sprays into both nostrils 2 (two) times daily.   Blood Glucose Monitoring Suppl DEVI 1 each by Does not apply route in the morning, at noon, and at bedtime. May substitute to any manufacturer covered by patient's insurance.   Calcium  Carb-Cholecalciferol (CALCIUM  600 + D PO) Take 1 tablet by mouth daily.   carvedilol  (COREG ) 12.5 MG tablet TAKE 1.5 TABLETS (18.75 MG TOTAL) BY MOUTH 2 (TWO) TIMES DAILY.   cetirizine  (ZYRTEC ) 10 MG tablet Take 10 mg by mouth 2 (two) times daily.   dapagliflozin  propanediol (FARXIGA ) 10 MG TABS tablet Take 1 tablet (10 mg total) by mouth daily before breakfast.   EPINEPHrine  0.3 mg/0.3 mL IJ SOAJ injection Inject 0.3 mg into the muscle as needed for anaphylaxis.   famotidine  (PEPCID ) 20 MG tablet Take 20 mg by mouth 2 (two) times daily.   glucose blood (ACCU-CHEK GUIDE TEST) test strip USE AS DIRECTED IN THE MORNING, AT NOON, AND AT BEDTIME.   Hyoscyamine  Sulfate 0.375 MG TBCR Take 0.375 mg by mouth 2 (two) times daily as needed (stomach cramping).   levothyroxine  (SYNTHROID ) 75 MCG tablet TAKE 1 TABLET BY MOUTH DAILY BEFORE BREAKFAST.   loperamide (IMODIUM A-D) 2 MG tablet Take 2 mg by mouth 4 (four) times daily as needed for diarrhea or loose stools.   MAGNESIUM  PO Take 250 mg by mouth daily.   mesalamine  (LIALDA ) 1.2 g EC tablet Take 2.4 g by mouth in the morning and at bedtime.    metFORMIN  (GLUCOPHAGE ) 500 MG tablet TAKE 1 TABLET BY MOUTH 2 TIMES DAILY WITH A MEAL.   montelukast  (SINGULAIR )  10 MG tablet Take 10 mg by mouth at bedtime.    Multiple Vitamin (MULTIVITAMIN WITH MINERALS) TABS tablet Take 1 tablet by mouth daily.   pantoprazole  (PROTONIX ) 40 MG tablet TAKE 1 TABLET BY MOUTH EVERY DAY   promethazine  (PHENERGAN ) 12.5 MG tablet Take 1 tablet (12.5 mg total) by mouth every 6 (six) hours as needed for nausea or vomiting.   rosuvastatin  (CRESTOR ) 20 MG tablet TAKE 1 TABLET BY MOUTH EVERY DAY   sacubitril -valsartan  (ENTRESTO ) 97-103 MG Take 1 tablet by mouth 2 (two) times daily.   spironolactone  (ALDACTONE ) 25 MG tablet Take 1 tablet (25 mg total) by mouth daily.   SYMBICORT  160-4.5 MCG/ACT inhaler INHALE 2 PUFFS INTO THE LUNGS TWICE A DAY   triamcinolone  (NASACORT ) 55 MCG/ACT AERO nasal inhaler Place 2 sprays into the nose daily.   VASCEPA  1 g capsule TAKE 2 CAPSULES BY MOUTH TWICE A DAY   venlafaxine  XR (EFFEXOR -XR) 150 MG 24 hr capsule TAKE 1 CAPSULE BY MOUTH DAILY WITH BREAKFAST.   zinc  gluconate 50 MG tablet Take 50 mg by mouth daily.   zolpidem  (AMBIEN ) 5 MG tablet TAKE 1 TABLET BY MOUTH EVERY DAY AT BEDTIME AS NEEDED FOR SLEEP STRENGTH: 5 MG   doxycycline  (VIBRAMYCIN ) 100 MG capsule Take 1  capsule (100 mg total) by mouth 2 (two) times daily. (Patient not taking: Reported on 05/07/2024)   predniSONE  (DELTASONE ) 20 MG tablet Take 40 mg by mouth daily for 3-5 days (Patient not taking: Reported on 05/07/2024)   No facility-administered encounter medications on file as of 05/07/2024.     Physical Exam: Today's Vitals   05/07/24 1414  BP: 103/68  Pulse: 91  Temp: 98.3 F (36.8 C)  TempSrc: Oral  SpO2: 95%  Weight: 170 lb (77.1 kg)  Height: 5' 3 (1.6 m)   Body mass index is 30.11 kg/m.  Physical Exam GEN: No acute distress CV: Regular rate and rhythm no murmurs LUNGS: Clear to auscultation bilaterally normal respiratory effort SKIN JOINTS: Warm and dry no rash  Data Reviewed: Imaging: CT chest 06/05/2023-no evidence of lung infiltrate, nodule.  Mild  centrilobular emphysema with minimal anterior lingular scarring.  Esophageal fluid levels, age advanced coronary atherosclerosis. I have reviewed the images personally.  PFTs: 05/07/2024 FVC 2.85 [90%], FEV1 2.27 [93%], F/F80, TLC 4.72 [96%], DLCO 15.86 [81%] Normal test  FENO 05/07/2024- 28  Labs: CBC 12/12/2023-WBC 8.9, eos 4.7%, absolute eosinophil count 418 IgE 12/12/2023-21  Sleep Overnight oximetry 07/06/2023 Duration of study 8 hours Time spent less than 88% total minutes 49 seconds  Home sleep study 01/10/2024 Moderate sleep apnea, AHI 35.4, desats to 84%  CPAP download 05/06/2024 57% compliance greater than 4 hours, residual AHI 2.9 Assessment & Plan Moderate persistent asthma with recurrent bronchitis Moderate persistent asthma with recurrent bronchitis, characterized by daily use of albuterol , green sputum production, and wheezing. Current treatment includes Symbicort , Singulair , and allergy  shots. CT scan from last year showed no interstitial lung disease. Considering a switch to Breztri  for enhanced control. Discussed potential use of doxycycline  for green sputum, as she prefers it over azithromycin . Discussed the possibility of injection therapy if symptoms persist despite new inhaler. FENO is borderline elevated indicating minimal eosinophilic inflammation - Switched Symbicort  to Breztri , two puffs twice daily. - Continue Singulair  and allergy  shots. - Prescribed doxycycline  for green sputum. - Recommended daily use of flutter valve twice a day with over-the-counter Mucinex. - Will consider biologic injection therapy if symptoms persist despite new inhaler.  Ulcerative colitis, GERD On Humira, PPI.  Status post recent endoscopy, colonoscopy Follows with gastroenterology at [facility]  Obstructive sleep apnea Moderate obstructive sleep apnea diagnosed via sleep study September 2026. Currently using CPAP but experiencing issues with mask fit and comfort, resulting in  usage of 4-5 hours per night. Previous mask caused skin irritation, and current mask is not fully effective. - Continue using CPAP as much as possible.  Heart failure with reduced ejection fraction Heart failure with previously reduced ejection fraction, now improved to 45%. Managed with heart failure medications, some of which may contribute to cough. - Continue current heart failure management. - Monitor for medication-related cough.  Recommendations: Change Symbicort  to Breztri  Continue Singulair , allergy  shots Doxycycline  Flutter valve, Mucinex  I personally spent a total of 30 minutes in the care of the patient today including preparing to see the patient, performing a medically appropriate exam/evaluation, placing orders, documenting clinical information in the EHR, independently interpreting results, and communicating results.   Lonna Coder MD [facility] 05/07/2024, 2:24 PM  CC: Marlissa Emerick, MD    "

## 2024-05-07 NOTE — Patient Instructions (Signed)
" °  VISIT SUMMARY: During your visit, we addressed your asthma, sleep apnea, gastrointestinal symptoms, and acid reflux. We made adjustments to your asthma medication and discussed your CPAP therapy for sleep apnea.  YOUR PLAN: MODERATE PERSISTENT ASTHMA WITH RECURRENT BRONCHITIS: You have moderate persistent asthma with recurrent bronchitis, characterized by daily use of albuterol , green sputum production, and wheezing. -Switch from Symbicort  to Breztri , two puffs twice daily. -Continue taking Singulair  and receiving allergy  shots. -Take doxycycline  for green sputum. -Use a flutter valve twice a day with over-the-counter Mucinex. -Consider injection therapy if symptoms persist despite the new inhaler.  OBSTRUCTIVE SLEEP APNEA: You have moderate obstructive sleep apnea and are currently using CPAP therapy but experiencing issues with mask fit and comfort. -Continue using CPAP as much as possible   Contains text generated by [employer].   "

## 2024-05-07 NOTE — Patient Instructions (Signed)
 Full pft performed today

## 2024-05-08 ENCOUNTER — Ambulatory Visit: Admitting: Family Medicine

## 2024-05-08 ENCOUNTER — Other Ambulatory Visit: Payer: Self-pay | Admitting: Nurse Practitioner

## 2024-05-08 DIAGNOSIS — N6320 Unspecified lump in the left breast, unspecified quadrant: Secondary | ICD-10-CM

## 2024-05-08 DIAGNOSIS — C50411 Malignant neoplasm of upper-outer quadrant of right female breast: Secondary | ICD-10-CM

## 2024-05-13 ENCOUNTER — Encounter: Payer: Self-pay | Admitting: Pulmonary Disease

## 2024-05-13 MED ORDER — TRELEGY ELLIPTA 200-62.5-25 MCG/ACT IN AEPB: 1.0000 | INHALATION_SPRAY | Freq: Every day | RESPIRATORY_TRACT | 3 refills | Status: DC

## 2024-05-13 NOTE — Telephone Encounter (Signed)
 We can try a different inhaler called Trelegy.  I have sent a prescription to your pharmacy.

## 2024-05-15 ENCOUNTER — Encounter: Payer: Self-pay | Admitting: Family Medicine

## 2024-05-15 ENCOUNTER — Ambulatory Visit
Admission: RE | Admit: 2024-05-15 | Discharge: 2024-05-15 | Disposition: A | Discharge status: Home / Self Care | Source: Ambulatory Visit | Attending: Nurse Practitioner | Admitting: Nurse Practitioner

## 2024-05-15 DIAGNOSIS — F5104 Psychophysiologic insomnia: Secondary | ICD-10-CM

## 2024-05-15 DIAGNOSIS — Z170 Estrogen receptor positive status [ER+]: Secondary | ICD-10-CM

## 2024-05-15 DIAGNOSIS — N6320 Unspecified lump in the left breast, unspecified quadrant: Secondary | ICD-10-CM

## 2024-05-15 MED ORDER — GADOPICLENOL 0.5 MMOL/ML IV SOLN
8.0000 mL | Freq: Once | INTRAVENOUS | Status: AC | PRN
  Administered 2024-05-15: 8 mL via INTRAVENOUS

## 2024-05-15 MED ORDER — ZOLPIDEM TARTRATE 5 MG PO TABS: ORAL_TABLET | ORAL | 2 refills | Status: AC

## 2024-05-16 ENCOUNTER — Other Ambulatory Visit: Payer: Self-pay | Admitting: Nurse Practitioner

## 2024-05-16 ENCOUNTER — Other Ambulatory Visit: Payer: Self-pay

## 2024-05-16 ENCOUNTER — Telehealth: Payer: Self-pay

## 2024-05-16 DIAGNOSIS — R9389 Abnormal findings on diagnostic imaging of other specified body structures: Secondary | ICD-10-CM

## 2024-05-16 MED ORDER — BUDESONIDE-FORMOTEROL FUMARATE 160-4.5 MCG/ACT IN AERO: 2.0000 | INHALATION_SPRAY | Freq: Two times a day (BID) | RESPIRATORY_TRACT | 11 refills | Status: AC

## 2024-05-16 MED ORDER — SPIRIVA RESPIMAT 2.5 MCG/ACT IN AERS: 2.0000 | INHALATION_SPRAY | Freq: Every day | RESPIRATORY_TRACT | 5 refills | Status: AC

## 2024-05-16 NOTE — Addendum Note (Signed)
 Addended byBETHA THEOPHILUS ROOSEVELT on: 05/16/2024 08:27 AM   Modules accepted: Orders

## 2024-05-16 NOTE — Telephone Encounter (Signed)
 Pt called stating she has completed the MRI that Lacie Burton, NP ordered for her at [facility].  Pt called inquiring about the results because she received another call from [facility] to schedule a biopsy of the pt's breast which the pt was unaware of needing this procedure.  Pt called requesting to speak with Dr Lanny or Lacie Burton, NP regarding the results.  Informed pt that both providers are out of the office today but this nurse will make them both aware of the pt's call and request for a return call.

## 2024-05-21 ENCOUNTER — Telehealth: Payer: Self-pay

## 2024-05-21 NOTE — Telephone Encounter (Signed)
 Called to confirm/remind patient of their appointment at the [facility] on 05/22/24.   Appointment:   [x] Confirmed  [] Left mess   [] No answer/No voice mail  [] VM Full/unable to leave message  [] Phone not in service  Patient reminded to bring all medications and/or complete list.  Confirmed patient has transportation. Gave directions, instructed to utilize valet parking.

## 2024-05-21 NOTE — Progress Notes (Signed)
 "  ADVANCED HF CLINIC NOTE   PCP: Dr. Watt Cardiology: Dr. Bernie HF Cardiology: Dr. Rolan  Chief complaint: CHF  Erin Fields is a 59 y.o. with history of ulcerative colitis, HTN, and breast cancer. Patient's breast cancer was treated with surgery and radiation, no chemo.  Patient reports exertional dyspnea since 2019.  Patient has right hip OA, needs THR.  Echo was done in 1/22 as part of pre-op workup, this showed low EF 25%.  She then had RHC/LHC in 3/22 with mild nonobstructive CAD and low cardiac index 2.1.  Echo 4/22: EF still 20-25%.  She was referred for ICD.  St Jude ICD was placed in 6/22 but complicated by RV perforation with hemothorax and hemorrhagic shock.  ICD was removed and RV was surgically repaired with sternotomy.  Patient's mother had CHF in her 26s, uncertain etiology.   Cardiac MRI 9/22: EF 35% with moderate LV HK, RV EF 44%, small area of LGE at the true apex (?small embolic apical infarct).    Echo 2/23: EF 40-45%, diffuse HK, mildly decreased RV systolic function.   Echo 10/24: EF 45% with septal bounce consistent with prior sternotomy, mild RV dysfunction, normal RV.   Today she returns for AHF follow up. Overall feeling ***. Denies palpitations, CP, dizziness, edema, or PND/Orthopnea. *** SOB. Appetite ok. No fever or chills. Weight at home *** pounds. Taking all medications. Denies ETOH, tobacco or drug use.   ECG (reviewed from 7/25): NSR, anterolateral TWIs  PMH: 1.  Cervical cancer 2.  B12 deficiency 3.  Asthma 4.  Type 2 diabetes 5.  Ulcerative colitis 6.  HTN 7.  Hypothyroidism 8.  Irritable bowel syndrome 9.  Breast cancer: Right-sided, surgery and radiation but no chemotherapy.  10.  Chronic systolic CHF: Nonischemic cardiomyopathy.  - Echo (1/22): EF 25% - LHC/RHC (3/22): Mild nonobstructive CAD; mean RA 3, PA 24/11, CI 2.1.  - Echo (4/22): EF 20-25%, mild LV dilation, normal RV.  - Echo (6/22): EF 30-35%  - St Jude ICD placed 6/22,  complicated by RV perforation requiring sternotomy due to hemothorax/hemorrhagic shock, removal of ICD and surgical repair of RV perforation.  - Cardiac MRI (9/22): EF 35% with moderate LV hypokinesis, RV EF 44%, small area of LGE at the true apex (?small embolic apical infarct).   - Echo (2/23): EF 40-45%, diffuse hypokinesis, mildly decreased RV systolic function. - Echo (12/23): EF 60-65%, mild RV dysfunction.  - Echo (10/24): EF 45% with septal bounce consistent with prior sternotomy, mild RV dysfunction, normal RV.  11.  OA: s/p right and left hip THR 12.  COVID-19 12/21  FH: Grandfather with MI, mother with CHF in her 64s.    SH: Lives in [place], nonsmoker, rare ETOH.   ROS: All systems reviewed and negative except as per HPI.  Current Outpatient Medications  Medication Sig Dispense Refill   acetaminophen  (TYLENOL ) 500 MG tablet Take 2 tablets (1,000 mg total) by mouth every 6 (six) hours as needed for moderate pain or mild pain. 30 tablet 0   acyclovir  (ZOVIRAX ) 400 MG tablet Take 1 tablet (400 mg total) by mouth 3 (three) times daily as needed. 30 tablet 1   Adalimumab 80 MG/0.8ML PNKT Inject 80 mg into the skin every 14 (fourteen) days.     albuterol  (VENTOLIN  HFA) 108 (90 Base) MCG/ACT inhaler Inhale 2 puffs into the lungs every 6 (six) hours as needed for shortness of breath.     Azelastine  HCl 0.15 % SOLN  Place 2 sprays into both nostrils 2 (two) times daily.     Blood Glucose Monitoring Suppl DEVI 1 each by Does not apply route in the morning, at noon, and at bedtime. May substitute to any manufacturer covered by patient's insurance. 1 each 0   budesonide -formoterol  (SYMBICORT ) 160-4.5 MCG/ACT inhaler Inhale 2 puffs into the lungs in the morning and at bedtime. 1 each 11   Calcium  Carb-Cholecalciferol (CALCIUM  600 + D PO) Take 1 tablet by mouth daily.     carvedilol  (COREG ) 12.5 MG tablet TAKE 1.5 TABLETS (18.75 MG TOTAL) BY MOUTH 2 (TWO) TIMES DAILY. 270 tablet 3    cetirizine  (ZYRTEC ) 10 MG tablet Take 10 mg by mouth 2 (two) times daily.     dapagliflozin  propanediol (FARXIGA ) 10 MG TABS tablet Take 1 tablet (10 mg total) by mouth daily before breakfast. 30 tablet 11   doxycycline  (VIBRA -TABS) 100 MG tablet Take 1 tablet (100 mg total) by mouth 2 (two) times daily. 14 tablet 0   EPINEPHrine  0.3 mg/0.3 mL IJ SOAJ injection Inject 0.3 mg into the muscle as needed for anaphylaxis.     famotidine  (PEPCID ) 20 MG tablet Take 20 mg by mouth 2 (two) times daily.     glucose blood (ACCU-CHEK GUIDE TEST) test strip USE AS DIRECTED IN THE MORNING, AT NOON, AND AT BEDTIME. 300 strip 1   Hyoscyamine  Sulfate 0.375 MG TBCR Take 0.375 mg by mouth 2 (two) times daily as needed (stomach cramping).     levothyroxine  (SYNTHROID ) 75 MCG tablet TAKE 1 TABLET BY MOUTH DAILY BEFORE BREAKFAST. 90 tablet 0   loperamide (IMODIUM A-D) 2 MG tablet Take 2 mg by mouth 4 (four) times daily as needed for diarrhea or loose stools.     MAGNESIUM  PO Take 250 mg by mouth daily.     mesalamine  (LIALDA ) 1.2 g EC tablet Take 2.4 g by mouth in the morning and at bedtime.      metFORMIN  (GLUCOPHAGE ) 500 MG tablet TAKE 1 TABLET BY MOUTH 2 TIMES DAILY WITH A MEAL. 180 tablet 1   montelukast  (SINGULAIR ) 10 MG tablet Take 10 mg by mouth at bedtime.      Multiple Vitamin (MULTIVITAMIN WITH MINERALS) TABS tablet Take 1 tablet by mouth daily.     pantoprazole  (PROTONIX ) 40 MG tablet TAKE 1 TABLET BY MOUTH EVERY DAY 90 tablet 1   predniSONE  (DELTASONE ) 20 MG tablet Take 40 mg by mouth daily for 3-5 days (Patient not taking: Reported on 05/07/2024) 10 tablet 0   promethazine  (PHENERGAN ) 12.5 MG tablet Take 1 tablet (12.5 mg total) by mouth every 6 (six) hours as needed for nausea or vomiting. 30 tablet 3   rosuvastatin  (CRESTOR ) 20 MG tablet TAKE 1 TABLET BY MOUTH EVERY DAY 90 tablet 1   sacubitril -valsartan  (ENTRESTO ) 97-103 MG Take 1 tablet by mouth 2 (two) times daily. 60 tablet 11   spironolactone   (ALDACTONE ) 25 MG tablet Take 1 tablet (25 mg total) by mouth daily. 90 tablet 3   Tiotropium Bromide (SPIRIVA  RESPIMAT) 2.5 MCG/ACT AERS Inhale 2 puffs into the lungs daily. 1 g 5   triamcinolone  (NASACORT ) 55 MCG/ACT AERO nasal inhaler Place 2 sprays into the nose daily.     VASCEPA  1 g capsule TAKE 2 CAPSULES BY MOUTH TWICE A DAY 360 capsule 3   venlafaxine  XR (EFFEXOR -XR) 150 MG 24 hr capsule TAKE 1 CAPSULE BY MOUTH DAILY WITH BREAKFAST. 90 capsule 1   zinc  gluconate 50 MG tablet Take 50 mg by mouth  daily.     zolpidem  (AMBIEN ) 5 MG tablet TAKE 1 TABLET BY MOUTH EVERY DAY AT BEDTIME AS NEEDED FOR SLEEP Strength: 5 mg 30 tablet 2   No current facility-administered medications for this visit.   Wt Readings from Last 3 Encounters:  05/07/24 77.1 kg (170 lb)  04/02/24 78.8 kg (173 lb 12.8 oz)  03/25/24 79.4 kg (175 lb)   There were no vitals taken for this visit. General:  *** appearing.  No respiratory difficulty Neck: JVD *** cm.  Cor: Regular rate & rhythm. No murmurs. Lungs: clear Extremities: no edema  Neuro: alert & oriented x 3. Affect pleasant.   Assessment/Plan: 1. Chronic systolic CHF: Nonischemic cardiomyopathy.  EF noted to be low since 1/22.  LHC in 3/22 with mild nonobstructive coronary disease.  Last echo in 6/22 with EF 30-35%.  Cardic MRI in 9/22 showed EF 35% with moderate LV hypokinesis, RV EF 44%, small area of LGE at the true apex (?small embolic apical infarct).  Echo in 2/23 showed EF up to 40-45%.  Echo 10/24 showed EF 45% with septal bounce consistent with prior sternotomy, mild RV dysfunction, normal RV. Cause of cardiomyopathy uncertain, her mother had a cardiomyopathy of uncertain etiology so cannot rule out familial cardiomyopathy.  She had treatment for breast cancer but this did not include chemotherapy.  She has been on adalimumab for ulcerative colitis, but this does not commonly cause CHF.  No drugs and rare ETOH.  The small area of apical LGE on cardiac MRI  does not explain the cardiomyopathy.  NYHA class II, not volume overloaded on exam.  - Continue Entresto  97/103 bid.  BMET today - Continue Coreg  18.75 mg bid, no BP room today to increase.  - Continue dapagliflozin  10 mg daily.  - Continue spironolactone  25 mg daily.   - I will arrange for repeat echo in 10/25.  - Echo with EF out of ICD range in 10/24.  2. RV perforation with ICD placement: S/p sternotomy/repair in 6/22.    3. Hyperlipidemia: Check lipids today.   BMET 3 months. Followup in 6 months with APP. ***   Beckey LITTIE Coe 05/21/2024 "

## 2024-05-22 ENCOUNTER — Encounter (HOSPITAL_COMMUNITY): Payer: Self-pay

## 2024-05-22 ENCOUNTER — Ambulatory Visit (HOSPITAL_COMMUNITY)
Admission: RE | Admit: 2024-05-22 | Discharge: 2024-05-22 | Disposition: A | Source: Ambulatory Visit | Attending: Internal Medicine

## 2024-05-22 VITALS — BP 106/68 | HR 83 | Wt 172.2 lb

## 2024-05-22 DIAGNOSIS — E785 Hyperlipidemia, unspecified: Secondary | ICD-10-CM

## 2024-05-22 DIAGNOSIS — I5022 Chronic systolic (congestive) heart failure: Secondary | ICD-10-CM | POA: Diagnosis not present

## 2024-05-22 DIAGNOSIS — I9789 Other postprocedural complications and disorders of the circulatory system, not elsewhere classified: Secondary | ICD-10-CM

## 2024-05-22 LAB — BASIC METABOLIC PANEL WITH GFR
Anion gap: 11 (ref 5–15)
BUN: 11 mg/dL (ref 8–23)
CO2: 25 mmol/L (ref 22–32)
Calcium: 9.8 mg/dL (ref 8.9–10.3)
Chloride: 103 mmol/L (ref 98–111)
Creatinine, Ser: 0.79 mg/dL (ref 0.44–1.00)
GFR, Estimated: >60 mL/min
Glucose, Bld: 114 mg/dL — ABNORMAL HIGH (ref 70–99)
Potassium: 4.5 mmol/L (ref 3.5–5.1)
Sodium: 140 mmol/L (ref 135–145)

## 2024-05-22 NOTE — Patient Instructions (Addendum)
 Good to see you today!  No medication changes were made   Labs done today, your results will be available in MyChart, we will contact you for abnormal readings.   Your physician recommends that you schedule a follow-up appointment 6 months(August) call office in June to schedule an appointment   If you have any questions or concerns before your next appointment please send us  a message through [place] or call our office at [phone].    TO LEAVE A MESSAGE FOR THE NURSE SELECT OPTION 2, PLEASE LEAVE A MESSAGE INCLUDING: YOUR NAME DATE OF BIRTH CALL BACK NUMBER REASON FOR CALL**this is important as we prioritize the call backs  YOU WILL RECEIVE A CALL BACK THE SAME DAY AS LONG AS YOU CALL BEFORE 4:00 PM At the [facility], you and your health needs are our priority. As part of our continuing mission to provide you with exceptional heart care, we have created designated Provider Care Teams. These Care Teams include your primary Cardiologist (physician) and Advanced Practice Providers (APPs- Physician Assistants and Nurse Practitioners) who all work together to provide you with the care you need, when you need it.   You may see any of the following providers on your designated Care Team at your next follow up: Dr Toribio Fuel Dr Ezra Shuck Dr. Morene Brownie Greig Mosses, NP Caffie Shed, [place] [facility] [place], [place] Beckey Coe, NP Jordan Lee, NP Ellouise Class, NP Tinnie Redman, PharmD Jaun Bash, PharmD   Please be sure to bring in all your medications bottles to every appointment.    Thank you for choosing [facility]

## 2024-05-23 ENCOUNTER — Ambulatory Visit (HOSPITAL_COMMUNITY): Payer: Self-pay | Admitting: Internal Medicine

## 2024-05-26 ENCOUNTER — Other Ambulatory Visit

## 2024-05-26 ENCOUNTER — Encounter

## 2024-06-07 ENCOUNTER — Other Ambulatory Visit

## 2024-08-06 ENCOUNTER — Ambulatory Visit: Admitting: Pulmonary Disease

## 2024-09-23 ENCOUNTER — Ambulatory Visit (INDEPENDENT_AMBULATORY_CARE_PROVIDER_SITE_OTHER): Admitting: Otolaryngology

## 2025-02-06 ENCOUNTER — Ambulatory Visit: Admitting: Nurse Practitioner

## 2025-02-06 ENCOUNTER — Other Ambulatory Visit
